# Patient Record
Sex: Male | Born: 1995 | Race: Black or African American | Hispanic: No | Marital: Single | Smoking: Never smoker
Health system: Western US, Academic
[De-identification: ages and names within clinical notes are randomized; demographics above are authoritative.]

## PROBLEM LIST (undated history)

## (undated) ENCOUNTER — Emergency Department (HOSPITAL_COMMUNITY): Payer: Medicaid Other | Source: Home / Self Care

## (undated) VITALS — BP 104/59 | HR 108 | Temp 97.4°F | Resp 18 | Ht 63.0 in | Wt 182.0 lb

## (undated) VITALS — BP 123/84 | HR 91 | Temp 98.3°F | Resp 16

## (undated) DIAGNOSIS — R569 Unspecified convulsions: Secondary | ICD-10-CM

## (undated) DIAGNOSIS — Z5900 Homelessness unspecified: Secondary | ICD-10-CM

## (undated) DIAGNOSIS — J45909 Unspecified asthma, uncomplicated: Secondary | ICD-10-CM

## (undated) DIAGNOSIS — F209 Schizophrenia, unspecified: Secondary | ICD-10-CM

## (undated) DIAGNOSIS — F32A Depression, unspecified: Secondary | ICD-10-CM

## (undated) DIAGNOSIS — T8509XA Other mechanical complication of ventricular intracranial (communicating) shunt, initial encounter: Secondary | ICD-10-CM

## (undated) DIAGNOSIS — R4589 Other symptoms and signs involving emotional state: Secondary | ICD-10-CM

## (undated) DIAGNOSIS — F319 Bipolar disorder, unspecified: Secondary | ICD-10-CM

## (undated) DIAGNOSIS — R45851 Suicidal ideations: Secondary | ICD-10-CM

## (undated) DIAGNOSIS — F329 Major depressive disorder, single episode, unspecified: Secondary | ICD-10-CM

## (undated) DIAGNOSIS — R4689 Other symptoms and signs involving appearance and behavior: Secondary | ICD-10-CM

## (undated) HISTORY — PX: TONSILLECTOMY: SUR1361

## (undated) HISTORY — PX: VENTRICULO-PERITONEAL SHUNT PLACEMENT / LAPAROSCOPIC INSERTION PERITONEAL CATHETER: SUR1040

## (undated) HISTORY — PX: SHUNT REMOVAL: SHX342

## (undated) HISTORY — PX: APPENDECTOMY: SHX54

---
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
## 2013-01-11 ENCOUNTER — Encounter (HOSPITAL_COMMUNITY): Payer: Self-pay | Admitting: *Deleted

## 2013-01-11 ENCOUNTER — Emergency Department (INDEPENDENT_AMBULATORY_CARE_PROVIDER_SITE_OTHER)
Admission: EM | Admit: 2013-01-11 | Discharge: 2013-01-11 | Disposition: A | Discharge status: Home / Self Care | Payer: Self-pay | Source: Home / Self Care | Attending: Emergency Medicine | Admitting: Emergency Medicine

## 2013-01-11 DIAGNOSIS — L255 Unspecified contact dermatitis due to plants, except food: Secondary | ICD-10-CM

## 2013-01-11 DIAGNOSIS — L237 Allergic contact dermatitis due to plants, except food: Secondary | ICD-10-CM

## 2013-01-11 MED ORDER — METHYLPREDNISOLONE ACETATE 80 MG/ML IJ SUSP
INTRAMUSCULAR | Status: AC
  Filled 2013-01-11: qty 1

## 2013-01-11 MED ORDER — DOMEBORO 25 % EX PACK: PACK | CUTANEOUS | Status: DC

## 2013-01-11 MED ORDER — METHYLPREDNISOLONE ACETATE 80 MG/ML IJ SUSP
80.0000 mg | Freq: Once | INTRAMUSCULAR | Status: AC
  Administered 2013-01-11: 80 mg via INTRAMUSCULAR

## 2013-01-11 MED ORDER — TRIAMCINOLONE ACETONIDE 0.1 % EX CREA: TOPICAL_CREAM | Freq: Three times a day (TID) | CUTANEOUS | Status: DC

## 2013-01-11 MED ORDER — PREDNISONE 20 MG PO TABS: ORAL_TABLET | ORAL | Status: DC

## 2013-01-11 NOTE — ED Notes (Addendum)
Vesicular rash to both arms onset yesterday after he got off work.  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  Denies itching or pain.  States his face was a little swollen this AM. Has fine raised bumps on his forehead and cheeks.

## 2013-01-11 NOTE — ED Provider Notes (Signed)
Chief Complaint:   Chief Complaint  Patient presents with  . Rash    History of Present Illness:   Justin Chaney is a 17 year old male who has had a two-day history of a rash on his arms and face. He works in Aeronautical [employer], and yesterday was exposed to brush that might have contained poison ivy. The rash is blistering but not very itchy. He denies any fever, chills, swelling of his lips, tongue, throat, wheezing, or difficulty breathing.  Review of Systems:  Other than noted above, the patient denies any of the following symptoms: Systemic:  No fever, chills, sweats, weight loss, or fatigue. ENT:  No nasal congestion, rhinorrhea, sore throat, swelling of lips, tongue or throat. Resp:  No cough, wheezing, or shortness of breath. Skin:  No rash, itching, nodules, or suspicious lesions.  PMFSH:  Past medical history, family history, social history, meds, and allergies were reviewed.   Physical Exam:   Vital signs:  There were no vitals taken for this visit. Gen:  Alert, oriented, in no distress. ENT:  Pharynx clear, no intraoral lesions, moist mucous membranes. Lungs:  Clear to auscultation. Skin:  He has a severe blistering rash on his forearms with streaks and patches of maculopapules and vesicles. He also has some rash on his face as well. His skin is otherwise clear.  Course in Urgent Care Center:   Given Depo-Medrol 80 mg IM.  Assessment:  The encounter diagnosis was Poison ivy.  Mrs. is very severe, blistering rash. He was warned to watch out for signs of infection and return if he has any further problems.  Plan:   1.  The following meds were prescribed:   Discharge Medication List as of 01/11/2013  5:15 PM    START taking these medications   Details  Alum Sulfate-Ca Acetate (DOMEBORO) 25 % PACK Dissolve 8 pack in 1 gallon on water, use as a moist compress for 15 minutes 3 times daily, Normal    predniSONE (DELTASONE) 20 MG tablet 3 daily for 5 days, 2 daily for 5 days, 1  daily for 5 days., Normal    triamcinolone cream (KENALOG) 0.1 % Apply topically 3 (three) times daily., Starting 01/11/2013, Until Discontinued, Normal       2.  The patient was instructed in symptomatic care and handouts were given. 3.  The patient was told to return if becoming worse in any way, if no better in 3 or 4 days, and given some red flag symptoms such as signs of infection that would indicate earlier return. 4.  Follow up here if needed.     Reuben Likes, MD 01/11/13 2128

## 2013-01-11 NOTE — ED Notes (Signed)
3 " cling with sterile 2x2's applied to both forearms to absorb fluid as blister break open. Pt. Instructed how to care for them.

## 2013-12-19 ENCOUNTER — Emergency Department (HOSPITAL_COMMUNITY)
Admission: EM | Admit: 2013-12-19 | Discharge: 2013-12-19 | Disposition: A | Discharge status: Home / Self Care | Payer: BC Managed Care – PPO | Attending: Emergency Medicine | Admitting: Emergency Medicine

## 2013-12-19 ENCOUNTER — Encounter (HOSPITAL_COMMUNITY): Payer: Self-pay | Admitting: Emergency Medicine

## 2013-12-19 DIAGNOSIS — Z79899 Other long term (current) drug therapy: Secondary | ICD-10-CM | POA: Insufficient documentation

## 2013-12-19 DIAGNOSIS — R45850 Homicidal ideations: Secondary | ICD-10-CM | POA: Insufficient documentation

## 2013-12-19 DIAGNOSIS — IMO0002 Reserved for concepts with insufficient information to code with codable children: Secondary | ICD-10-CM | POA: Insufficient documentation

## 2013-12-19 DIAGNOSIS — R45851 Suicidal ideations: Secondary | ICD-10-CM

## 2013-12-19 DIAGNOSIS — F3289 Other specified depressive episodes: Secondary | ICD-10-CM | POA: Insufficient documentation

## 2013-12-19 DIAGNOSIS — Z880 Allergy status to penicillin: Secondary | ICD-10-CM | POA: Insufficient documentation

## 2013-12-19 DIAGNOSIS — J45909 Unspecified asthma, uncomplicated: Secondary | ICD-10-CM | POA: Insufficient documentation

## 2013-12-19 DIAGNOSIS — F17200 Nicotine dependence, unspecified, uncomplicated: Secondary | ICD-10-CM | POA: Insufficient documentation

## 2013-12-19 DIAGNOSIS — G40909 Epilepsy, unspecified, not intractable, without status epilepticus: Secondary | ICD-10-CM | POA: Insufficient documentation

## 2013-12-19 DIAGNOSIS — Z008 Encounter for other general examination: Secondary | ICD-10-CM

## 2013-12-19 DIAGNOSIS — F1510 Other stimulant abuse, uncomplicated: Secondary | ICD-10-CM | POA: Insufficient documentation

## 2013-12-19 DIAGNOSIS — Z982 Presence of cerebrospinal fluid drainage device: Secondary | ICD-10-CM | POA: Insufficient documentation

## 2013-12-19 DIAGNOSIS — F329 Major depressive disorder, single episode, unspecified: Secondary | ICD-10-CM | POA: Insufficient documentation

## 2013-12-19 DIAGNOSIS — F911 Conduct disorder, childhood-onset type: Secondary | ICD-10-CM | POA: Insufficient documentation

## 2013-12-19 DIAGNOSIS — F1210 Cannabis abuse, uncomplicated: Secondary | ICD-10-CM | POA: Insufficient documentation

## 2013-12-19 LAB — COMPREHENSIVE METABOLIC PANEL
ALT: 28 U/L (ref 0–53)
AST: 32 U/L (ref 0–37)
Albumin: 3.7 g/dL (ref 3.5–5.2)
Alkaline Phosphatase: 84 U/L (ref 39–117)
BUN: 14 mg/dL (ref 6–23)
CO2: 26 mEq/L (ref 19–32)
Calcium: 9 mg/dL (ref 8.4–10.5)
Chloride: 103 mEq/L (ref 96–112)
Creatinine, Ser: 0.75 mg/dL (ref 0.50–1.35)
GFR calc Af Amer: >90 mL/min (ref >90)
GFR calc non Af Amer: >90 mL/min (ref >90)
Glucose, Bld: 105 mg/dL — ABNORMAL HIGH (ref 70–99)
Potassium: 3.8 mEq/L (ref 3.7–5.3)
Sodium: 141 mEq/L (ref 137–147)
Total Bilirubin: 0.3 mg/dL (ref 0.3–1.2)
Total Protein: 6.3 g/dL (ref 6.0–8.3)

## 2013-12-19 LAB — RAPID URINE DRUG SCREEN, HOSP PERFORMED
Amphetamines: POSITIVE — AB
Barbiturates: NOT DETECTED
Benzodiazepines: NOT DETECTED
Cocaine: NOT DETECTED
Opiates: NOT DETECTED
Tetrahydrocannabinol: POSITIVE — AB

## 2013-12-19 LAB — CBC
HCT: 40.2 % (ref 39.0–52.0)
Hemoglobin: 13.9 g/dL (ref 13.0–17.0)
MCH: 29.2 pg (ref 26.0–34.0)
MCHC: 34.6 g/dL (ref 30.0–36.0)
MCV: 84.5 fL (ref 78.0–100.0)
Platelets: 227 10*3/uL (ref 150–400)
RBC: 4.76 MIL/uL (ref 4.22–5.81)
RDW: 11.8 % (ref 11.5–15.5)
WBC: 7 10*3/uL (ref 4.0–10.5)

## 2013-12-19 LAB — SALICYLATE LEVEL: Salicylate Lvl: <2 mg/dL — ABNORMAL LOW (ref 2.8–20.0)

## 2013-12-19 LAB — ETHANOL: Alcohol, Ethyl (B): <11 mg/dL (ref 0–11)

## 2013-12-19 LAB — ACETAMINOPHEN LEVEL: Acetaminophen (Tylenol), Serum: <15 ug/mL (ref 10–30)

## 2013-12-19 MED ORDER — NICOTINE 21 MG/24HR TD PT24: 21.0000 mg | MEDICATED_PATCH | Freq: Every day | TRANSDERMAL | Status: DC

## 2013-12-19 MED ORDER — LORAZEPAM 1 MG PO TABS: 1.0000 mg | ORAL_TABLET | Freq: Three times a day (TID) | ORAL | Status: DC | PRN

## 2013-12-19 MED ORDER — IBUPROFEN 200 MG PO TABS: 600.0000 mg | ORAL_TABLET | Freq: Three times a day (TID) | ORAL | Status: DC | PRN

## 2013-12-19 MED ORDER — ZOLPIDEM TARTRATE 5 MG PO TABS: 5.0000 mg | ORAL_TABLET | Freq: Every evening | ORAL | Status: DC | PRN

## 2013-12-19 NOTE — ED Provider Notes (Signed)
Medical screening examination/treatment/procedure(s) were performed by non-physician practitioner and as supervising physician I was immediately available for consultation/collaboration.   EKG Interpretation None        Leenah Seidner N Spyridon Hornstein, DO 12/19/13 2321 

## 2013-12-19 NOTE — ED Notes (Addendum)
Per paperwork from [place] and pt report, pt is depressed and SI.  Pt reports trying to slit his throat a few days ago.  Pt has no plan right now but states he's always thinking about hurting himself. Pt reports HI in which he will hurt "any body who will get in his way." Pt endorses A/V hallucinations but not currently. 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 Law enforcement at bedside.

## 2013-12-19 NOTE — Discharge Instructions (Signed)
Medical Screening Exam A medical screening exam has been done. This exam helps find the cause of your problem and determines whether you need emergency treatment. Your exam has shown that you do not need emergency treatment at this point. It is safe for you to go to your caregiver's office or clinic for treatment. You should make an appointment today to see your caregiver as soon as he or she is available. Depending on your illness, your symptoms and condition can change over time. If your condition gets worse or you develop new or troubling symptoms before you see your caregiver, you should return to the emergency department for further evaluation.  Document Released: 08/26/2004 Document Revised: 10/11/2011 Document Reviewed: 04/07/2011 [facility] Patient Information 2014 [place], [place]. Return to directly to  [facility] for placement

## 2013-12-19 NOTE — ED Notes (Signed)
Lab results were faxed to [facility].

## 2013-12-19 NOTE — ED Provider Notes (Signed)
CSN: [id number]     Arrival date & time 12/19/13  1950 History   This chart was scribed for non-physician practitioner Junius Creamer, NP, working with Delice Bison Ward, DO, by Neta Ehlers, ED Scribe. This patient was seen in room [facility] and the patient's care was started at 8:46 PM. First MD Initiated Contact with Patient 12/19/13 2041     Chief Complaint  Patient presents with  . Medical Clearance    The history is provided by the patient. No language interpreter was used.   HPI Comments: Justin Chaney is a 18 y.o. male, with a h/o a VP shunt, seizures, and depression, who presents to the Emergency Department for medical clearance. The pt reports he has contemplated suicide for several weeks. He reports he attempted to "cut my throat with a knife from [place]." He denies a h/o psychiatric counseling or medication re his suicide ideations stating he views medication as "poison."  He reports continual anger issues with a desire to "hurt others" though he denies being arrested due to hurting others. The pt was hospitalized at age 33 for anger issues, but he denies subsequent hospitalizations.  Past Medical History  Diagnosis Date  . Asthma   . Seizures   . Brain ventricular shunt obstruction     hydrochelpis  . Depression    Past Surgical History  Procedure Laterality Date  . Tonsillectomy    . Ventriculo-peritoneal shunt placement / laparoscopic insertion peritoneal catheter     No family history on file. History  Substance Use Topics  . Smoking status: Current Every Day Smoker -- 2.00 packs/day    Types: Cigarettes  . Smokeless tobacco: Not on file  . Alcohol Use: 2.4 oz/week    4 Cans of beer per week     Comment: 4-8 40 oz.on the weekend    Review of Systems  Constitutional: Negative for fever.  Psychiatric/Behavioral: Positive for suicidal ideas, behavioral problems and self-injury.    Allergies  Amoxicillin and Penicillins  Home Medications   Prior to  Admission medications   Medication Sig Start Date End Date Taking? Authorizing Provider  Alum Sulfate-Ca Acetate (DOMEBORO) 25 % PACK Dissolve 8 pack in 1 gallon on water, use as a moist compress for 15 minutes 3 times daily 01/11/13   Harden Mo, MD  escitalopram (LEXAPRO) 10 MG tablet Take 1 tablet (10 mg total) by mouth daily. 08/15/12   Durward Parcel, MD  escitalopram (LEXAPRO) 10 MG tablet Take 1 tablet (10 mg total) by mouth daily. 08/15/12   Orpah Greek, MD  predniSONE (DELTASONE) 20 MG tablet 3 daily for 5 days, 2 daily for 5 days, 1 daily for 5 days. 01/11/13   Harden Mo, MD  triamcinolone cream (KENALOG) 0.1 % Apply topically 3 (three) times daily. 01/11/13   Harden Mo, MD   Triage Vitals: BP 123/71  Pulse 92  Temp(Src) 98.2 F (36.8 C) (Oral)  Resp 18  SpO2 99%  Physical Exam  Nursing note and vitals reviewed. Constitutional: He is oriented to person, place, and time. He appears well-developed and well-nourished. No distress.  HENT:  Head: Normocephalic and atraumatic.  Eyes: EOM are normal.  Neck: Neck supple. No tracheal deviation present.  Cardiovascular: Normal rate.   Pulmonary/Chest: Effort normal. No respiratory distress.  Musculoskeletal: Normal range of motion.  Neurological: He is alert and oriented to person, place, and time.  Skin: Skin is warm and dry.  Psychiatric: His speech is normal and behavior is  normal. His affect is angry. Cognition and memory are normal. He expresses impulsivity and inappropriate judgment. He expresses homicidal and suicidal ideation. He expresses suicidal plans.    ED Course  Procedures (including critical care time)  DIAGNOSTIC STUDIES: Oxygen Saturation is 99% on room air, normal by my interpretation.    COORDINATION OF CARE:  9:00 PM- Discussed treatment plan with patient, and the patient agreed to the plan.   Labs Review Labs Reviewed  COMPREHENSIVE METABOLIC PANEL - Abnormal; Notable for  the following:    Glucose, Bld 105 (*)    All other components within normal limits  SALICYLATE LEVEL - Abnormal; Notable for the following:    Salicylate Lvl <9.6 (*)    All other components within normal limits  URINE RAPID DRUG SCREEN (HOSP PERFORMED) - Abnormal; Notable for the following:    Amphetamines POSITIVE (*)    Tetrahydrocannabinol POSITIVE (*)    All other components within normal limits  ACETAMINOPHEN LEVEL  CBC  ETHANOL    Imaging Review No results found.   EKG Interpretation None      MDM  Glue was uncooperative and resistant to giving a history after he was given his options.  He is now able to cooperate agrees to during assessment.  He understands that if he becomes agitated out-of-control will be given medication. Patient has been medically cleared to return to [facility]  for placement Final diagnoses:  Suicidal ideation  Medical clearance for psychiatric admission       I personally performed the services described in this documentation, which was scribed in my presence. The recorded information has been reviewed and is accurate.     Garald Balding, NP 12/19/13 2201

## 2013-12-19 NOTE — ED Notes (Signed)
Pt endorses THC use and adderol.

## 2013-12-19 NOTE — ED Notes (Signed)
Per [place] at [facility], ok to transport back to facility.

## 2013-12-30 ENCOUNTER — Emergency Department (HOSPITAL_COMMUNITY)
Admission: EM | Admit: 2013-12-30 | Discharge: 2014-01-01 | Disposition: A | Discharge status: Home / Self Care | Payer: BC Managed Care – PPO | Attending: Emergency Medicine | Admitting: Emergency Medicine

## 2013-12-30 ENCOUNTER — Encounter (HOSPITAL_COMMUNITY): Payer: Self-pay | Admitting: Emergency Medicine

## 2013-12-30 DIAGNOSIS — F17200 Nicotine dependence, unspecified, uncomplicated: Secondary | ICD-10-CM | POA: Insufficient documentation

## 2013-12-30 DIAGNOSIS — F329 Major depressive disorder, single episode, unspecified: Secondary | ICD-10-CM | POA: Insufficient documentation

## 2013-12-30 DIAGNOSIS — R443 Hallucinations, unspecified: Secondary | ICD-10-CM | POA: Insufficient documentation

## 2013-12-30 DIAGNOSIS — R45850 Homicidal ideations: Secondary | ICD-10-CM

## 2013-12-30 DIAGNOSIS — F1994 Other psychoactive substance use, unspecified with psychoactive substance-induced mood disorder: Secondary | ICD-10-CM

## 2013-12-30 DIAGNOSIS — F0630 Mood disorder due to known physiological condition, unspecified: Secondary | ICD-10-CM

## 2013-12-30 DIAGNOSIS — Z982 Presence of cerebrospinal fluid drainage device: Secondary | ICD-10-CM | POA: Insufficient documentation

## 2013-12-30 DIAGNOSIS — F1210 Cannabis abuse, uncomplicated: Secondary | ICD-10-CM | POA: Diagnosis present

## 2013-12-30 DIAGNOSIS — F3289 Other specified depressive episodes: Secondary | ICD-10-CM | POA: Insufficient documentation

## 2013-12-30 DIAGNOSIS — R45851 Suicidal ideations: Secondary | ICD-10-CM

## 2013-12-30 DIAGNOSIS — F29 Unspecified psychosis not due to a substance or known physiological condition: Secondary | ICD-10-CM | POA: Diagnosis present

## 2013-12-30 DIAGNOSIS — R440 Auditory hallucinations: Secondary | ICD-10-CM

## 2013-12-30 DIAGNOSIS — F1510 Other stimulant abuse, uncomplicated: Secondary | ICD-10-CM | POA: Diagnosis present

## 2013-12-30 NOTE — ED Notes (Signed)
Pt refusing to have blood drawn.

## 2013-12-30 NOTE — ED Notes (Signed)
The pt reports that he just left [facility].  He also was at [facility] long ed recently but denies that

## 2013-12-30 NOTE — ED Notes (Signed)
The mother brought the pt in because has been agitated  Today hearing voices.  He has not been living at home.  He has been to [facility] but he will not take his meds.  Scattered ideas .  He thinks med poisons people

## 2013-12-30 NOTE — ED Notes (Signed)
Phlebotomy notified pt aggred to have blood work drawn.

## 2013-12-30 NOTE — ED Notes (Signed)
The mother states the pt is suicidal however the pt is not verbalizing this

## 2013-12-30 NOTE — ED Notes (Signed)
The pt thinks about suicide but he does not feel like that now.  But he thinks about killing other people

## 2013-12-31 ENCOUNTER — Encounter (HOSPITAL_COMMUNITY): Payer: Self-pay | Admitting: Emergency Medicine

## 2013-12-31 DIAGNOSIS — F29 Unspecified psychosis not due to a substance or known physiological condition: Secondary | ICD-10-CM | POA: Diagnosis present

## 2013-12-31 DIAGNOSIS — F1994 Other psychoactive substance use, unspecified with psychoactive substance-induced mood disorder: Secondary | ICD-10-CM

## 2013-12-31 DIAGNOSIS — F1910 Other psychoactive substance abuse, uncomplicated: Secondary | ICD-10-CM

## 2013-12-31 DIAGNOSIS — R45850 Homicidal ideations: Secondary | ICD-10-CM

## 2013-12-31 DIAGNOSIS — R45851 Suicidal ideations: Secondary | ICD-10-CM

## 2013-12-31 LAB — CBC
HCT: 39.5 % (ref 39.0–52.0)
Hemoglobin: 13.4 g/dL (ref 13.0–17.0)
MCH: 28.9 pg (ref 26.0–34.0)
MCHC: 33.9 g/dL (ref 30.0–36.0)
MCV: 85.1 fL (ref 78.0–100.0)
Platelets: 209 10*3/uL (ref 150–400)
RBC: 4.64 MIL/uL (ref 4.22–5.81)
RDW: 12.2 % (ref 11.5–15.5)
WBC: 7.1 10*3/uL (ref 4.0–10.5)

## 2013-12-31 LAB — COMPREHENSIVE METABOLIC PANEL
ALT: 43 U/L (ref 0–53)
AST: 65 U/L — ABNORMAL HIGH (ref 0–37)
Albumin: 3.9 g/dL (ref 3.5–5.2)
Alkaline Phosphatase: 80 U/L (ref 39–117)
BUN: 12 mg/dL (ref 6–23)
CO2: 21 mEq/L (ref 19–32)
Calcium: 9.3 mg/dL (ref 8.4–10.5)
Chloride: 108 mEq/L (ref 96–112)
Creatinine, Ser: 0.72 mg/dL (ref 0.50–1.35)
GFR calc Af Amer: >90 mL/min (ref >90)
GFR calc non Af Amer: >90 mL/min (ref >90)
Glucose, Bld: 103 mg/dL — ABNORMAL HIGH (ref 70–99)
Potassium: 3.8 mEq/L (ref 3.7–5.3)
Sodium: 142 mEq/L (ref 137–147)
Total Bilirubin: 0.6 mg/dL (ref 0.3–1.2)
Total Protein: 6.9 g/dL (ref 6.0–8.3)

## 2013-12-31 LAB — RAPID URINE DRUG SCREEN, HOSP PERFORMED
Amphetamines: POSITIVE — AB
Barbiturates: NOT DETECTED
Benzodiazepines: NOT DETECTED
Cocaine: NOT DETECTED
Opiates: NOT DETECTED
Tetrahydrocannabinol: POSITIVE — AB

## 2013-12-31 LAB — ETHANOL: Alcohol, Ethyl (B): <11 mg/dL (ref 0–11)

## 2013-12-31 LAB — SALICYLATE LEVEL: Salicylate Lvl: <2 mg/dL — ABNORMAL LOW (ref 2.8–20.0)

## 2013-12-31 LAB — ACETAMINOPHEN LEVEL: Acetaminophen (Tylenol), Serum: <15 ug/mL (ref 10–30)

## 2013-12-31 MED ORDER — ONDANSETRON HCL 4 MG PO TABS: 4.0000 mg | ORAL_TABLET | Freq: Three times a day (TID) | ORAL | Status: DC | PRN

## 2013-12-31 MED ORDER — LORAZEPAM 1 MG PO TABS
1.0000 mg | ORAL_TABLET | Freq: Once | ORAL | Status: DC
  Filled 2013-12-31: qty 1

## 2013-12-31 MED ORDER — ACETAMINOPHEN 325 MG PO TABS: 650.0000 mg | ORAL_TABLET | ORAL | Status: DC | PRN

## 2013-12-31 MED ORDER — ZIPRASIDONE MESYLATE 20 MG IM SOLR
20.0000 mg | Freq: Once | INTRAMUSCULAR | Status: AC | PRN
  Administered 2013-12-31: 20 mg via INTRAMUSCULAR
  Filled 2013-12-31: qty 20

## 2013-12-31 MED ORDER — ZOLPIDEM TARTRATE 5 MG PO TABS: 5.0000 mg | ORAL_TABLET | Freq: Every evening | ORAL | Status: DC | PRN

## 2013-12-31 MED ORDER — IBUPROFEN 200 MG PO TABS: 600.0000 mg | ORAL_TABLET | Freq: Three times a day (TID) | ORAL | Status: DC | PRN

## 2013-12-31 MED ORDER — NICOTINE 21 MG/24HR TD PT24
21.0000 mg | MEDICATED_PATCH | Freq: Every day | TRANSDERMAL | Status: DC
Start: 2013-12-31 — End: 2014-01-01

## 2013-12-31 NOTE — ED Provider Notes (Signed)
CSN: [id number]     Arrival date & time 12/30/13  2317 History   First MD Initiated Contact with Patient 12/30/13 2353     Chief Complaint  Patient presents with  . Psychiatric Evaluation     (Consider location/radiation/quality/duration/timing/severity/associated sxs/prior Treatment) HPI 18 year old male presents to emergency room brought in by his mother who is concerned about his mental state.  His mother reports that he has been increasingly more agitated.  He has been hearing voices that bother him.  Patient was walking outside in the rain pacing in the driveway today do to intrusive thoughts and voices.  Patient has history of bipolar, has been too but there before, but will not take his medications or go to therapy.  Patient has reported that he feels that the medications are poison.  Patient has made statements about killing himself or killing other people.  He denies having a specific plan or people that he wants to harm.  He reports some day it will just happen.  Patient is not willing to speak about anything with me.  His mother is concerned for her safety and the safety of the other children in the home.  She reports that her son, the patient doesn't officially with him, often will  live in the woods for days on end. Past Medical History  Diagnosis Date  . Asthma   . Seizures   . Brain ventricular shunt obstruction     hydrochelpis  . Depression    Past Surgical History  Procedure Laterality Date  . Tonsillectomy    . Ventriculo-peritoneal shunt placement / laparoscopic insertion peritoneal catheter     No family history on file. History  Substance Use Topics  . Smoking status: Current Every Day Smoker -- 2.00 packs/day    Types: Cigarettes  . Smokeless tobacco: Not on file  . Alcohol Use: 2.4 oz/week    4 Cans of beer per week     Comment: 4-8 40 oz.on the weekend    Review of Systems  Unable to perform ROS: Psychiatric disorder      Allergies  Amoxicillin and  Penicillins  Home Medications   Prior to Admission medications   Not on File   BP 84/69  Pulse 81  Temp(Src) 98 F (36.7 C) (Oral)  Resp 18  SpO2 98% Physical Exam  Nursing note and vitals reviewed. Constitutional: He appears well-developed and well-nourished. He appears distressed.  Skin: Skin is warm and dry. No rash noted. No erythema. No pallor.  Psychiatric:  Patient has hostile mood.  He reports he has thoughts of hurting others and himself.  He refuses all interactions.   patient refuses physical exam  ED Course  Procedures (including critical care time) Labs Review Labs Reviewed  COMPREHENSIVE METABOLIC PANEL - Abnormal; Notable for the following:    Glucose, Bld 103 (*)    AST 65 (*)    All other components within normal limits  SALICYLATE LEVEL - Abnormal; Notable for the following:    Salicylate Lvl <7.0 (*)    All other components within normal limits  URINE RAPID DRUG SCREEN (HOSP PERFORMED) - Abnormal; Notable for the following:    Amphetamines POSITIVE (*)    Tetrahydrocannabinol POSITIVE (*)    All other components within normal limits  ACETAMINOPHEN LEVEL  CBC  ETHANOL    Imaging Review No results found.   EKG Interpretation None      MDM   Final diagnoses:  Psychosis  Auditory hallucinations  Pt has been IVC'd due to concern for elopement.  To be transferred to locked unit at [facility] ED.    Kalman Drape, MD 12/31/13 [phone]

## 2013-12-31 NOTE — ED Notes (Signed)
[facility] called to transport

## 2013-12-31 NOTE — ED Notes (Signed)
Discuss with Dr. Sharol Given of the possiblity for patient to be transfer to [facility] psych ed, patient is very guarded refused to communicate with present er staff. Pt will keep his street clothes on,appears ready to dart out of the room if motvated, Patient appears in a psychotic state at present. Refused to take any medication, stating all medication are posison

## 2013-12-31 NOTE — BH Assessment (Signed)
Received a call for a tele-assessment. Patient has been refusing to talk to others. Pt is experiencing auditory hallucinations and has been pacing around with a blanket in the rain. Pt stated "everyone thinks about suicide everyday". Pt reported that he wants to kill people and stated that he will do it one days. Pt has been hospitalized in the past. Pt is very angry,hostile and refusing to change into scrubs. Pt will be transferred to [facility] later on this morning. Tele-assessment will be initiated.

## 2013-12-31 NOTE — BH Assessment (Signed)
Assessment Note  Justin Chaney is an 18 y.o. male who presents to [facility] ED after his mother encouraged him to come. It has been reported that patient is experiencing auditory hallucinations and has been walking outside with a blanket over his head in the rain.  Pt is alert; however he is very uncooperative during this assessment. When pt was asked about thoughts to harm himself pt stated "I don't know what you speak of". When patient was asked about HI pt laughed and then stated "I don't know what you speak of. Pt is currently endorsing AH/VH at this time and stated "I don't feel like talking about it right now". Pt reported that he uses illicit drugs and most recently smoked a "roach clip" and took about "10 Adderall's". Pt reported that he was hospitalized at [facility] when he was 18 years old for anger issues. When pt was asked about his medication, pt stated I don't have medicine it's poising. Pt denied any physical or sexual abuse at this time. When asked about emotional abuse pt responded "it depends on how you look at it". Pt reported that he lives alone and was recently fired from his job. It has been reported that patient mother is scared that he will harm other children in the home or herself.   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 Asthma   . Seizures   . Brain ventricular shunt obstruction     hydrochelpis  . Depression    Axis IV: other psychosocial or environmental problems Axis V: 21-30 behavior considerably influenced by delusions or hallucinations OR serious impairment in judgment, communication OR inability to function in almost all areas  Past Medical History:  Past Medical History  Diagnosis Date  . Asthma   . Seizures   . Brain ventricular shunt obstruction     hydrochelpis  . Depression     Past Surgical History  Procedure Laterality Date  . Tonsillectomy    .  Ventriculo-peritoneal shunt placement / laparoscopic insertion peritoneal catheter      Family History: History reviewed. No pertinent family history.  Social History:  reports that he has been smoking Cigarettes.  He has been smoking about 2.00 packs per day. He does not have any smokeless tobacco history on file. He reports that he drinks about 2.4 ounces of alcohol per week. He reports that he uses illicit drugs (Marijuana) about 7 times per week.  Additional Social History:  Alcohol / Drug Use Prescriptions: Pt abuses Adderall History of alcohol / drug use?: Yes Substance #1 Name of Substance 1: Marijuana  1 - Amount (size/oz): "roach clip" 1 - Last Use / Amount: 12-30-13  CIWA: CIWA-Ar BP: 84/69 mmHg Pulse Rate: 81 COWS:    Allergies:  Allergies  Allergen Reactions  . Amoxicillin Other (See Comments)    Reaction unknown  . Penicillins Other (See Comments)    Reaction unknown    Home Medications:  (Not in a hospital admission)  OB/GYN Status:  No LMP for male patient.  General Assessment Data Location of Assessment: [facility] ED Is this a Tele or Face-to-Face Assessment?: Tele Assessment Is this an Initial Assessment or a Re-assessment for this encounter?: Initial Assessment Living Arrangements: Alone Can pt return to current living arrangement?: Yes Admission Status: Involuntary Is patient capable of signing voluntary admission?: No Transfer from: [facility] Referral Source: Self/Family/Friend     [facility]  Crisis Care Plan Living Arrangements: Alone Name of Psychiatrist: None reported  Name of Therapist: none reported  Education Status Is patient currently in school?: No  Risk to self Suicidal Ideation: No Suicidal Intent: No Is patient at risk for suicide?: No Suicidal Plan?: No Access to Means: No What has been your use of drugs/alcohol within the last 12 months?: daily Previous Attempts/Gestures: No Triggers for Past Attempts: None known Intentional Self  Injurious Behavior: None Family Suicide History: Unable to assess Recent stressful life event(s): Job Loss Persecutory voices/beliefs?: No Depression: Yes Substance abuse history and/or treatment for substance abuse?: Yes Suicide prevention information given to non-admitted patients: Not applicable  Risk to Others Homicidal Ideation: Yes-Currently Present Thoughts of Harm to Others: No Current Homicidal Intent: No Current Homicidal Plan: No Access to Homicidal Means: No Identified Victim: NA History of harm to others?:  (unknown) Assessment of Violence: None Noted Violent Behavior Description: no violent behavior reported Does patient have access to weapons?:  (unknown ) Criminal Charges Pending?:  (unknown ) Does patient have a court date:  (unknown)  Psychosis Hallucinations: Auditory;Visual Delusions: None noted  Mental Status Report Appear/Hygiene: Disheveled Eye Contact: Poor Motor Activity: Freedom of movement Speech: Logical/coherent Level of Consciousness: Quiet/awake Mood: Depressed Affect: Blunted Anxiety Level: None Thought Processes: Circumstantial Judgement: Impaired Orientation: Appropriate for developmental age Obsessive Compulsive Thoughts/Behaviors: Unable to Assess  Cognitive Functioning Concentration: Unable to Assess Memory: Unable to Assess IQ: Average Insight: Fair Impulse Control: Fair Appetite: Fair Weight Loss: 0 Sleep: No Change Total Hours of Sleep: 6 Vegetative Symptoms: None  ADLScreening [facility] Assessment Services) Patient's cognitive ability adequate to safely complete daily activities?: Yes Patient able to express need for assistance with ADLs?: Yes Independently performs ADLs?: Yes (appropriate for developmental age)  Prior Inpatient Therapy Prior Inpatient Therapy: Yes Prior Therapy Dates: 2010 Prior Therapy Facilty/Provider(s): [facility] Reason for Treatment: "anger issues"  Prior Outpatient Therapy Prior Outpatient Therapy:  No  ADL Screening (condition at time of admission) Patient's cognitive ability adequate to safely complete daily activities?: Yes Is the patient deaf or have difficulty hearing?: No Does the patient have difficulty seeing, even when wearing glasses/contacts?: No Does the patient have difficulty concentrating, remembering, or making decisions?: No Patient able to express need for assistance with ADLs?: Yes Does the patient have difficulty dressing or bathing?: No Independently performs ADLs?: Yes (appropriate for developmental age) Does the patient have difficulty walking or climbing stairs?: No       Abuse/Neglect Assessment (Assessment to be complete while patient is alone) Physical Abuse: Denies Verbal Abuse: Denies Sexual Abuse: Denies Exploitation of patient/patient's resources: Denies Self-Neglect: Denies Values / Beliefs Cultural Requests During Hospitalization: None        Additional Information 1:1 In Past 12 Months?: No CIRT Risk: No Elopement Risk: No Does patient have medical clearance?: Yes     Disposition:  Disposition Initial Assessment Completed for this Encounter: Yes Disposition of Patient: Inpatient treatment program Type of inpatient treatment program: Adult  On Site Evaluation by:   Reviewed with Physician:    Kandis Ban 12/31/2013 5:18 AM

## 2013-12-31 NOTE — ED Notes (Signed)
Bed: [facility] Expected date: 12/31/13 Expected time:  Means of arrival: Police Comments: [facility] transfer

## 2013-12-31 NOTE — Progress Notes (Signed)
Attempted placement with the following facilities:  [facility]- no beds [facility]- faxed information [facility]- left message [facility]- [facility] beds [facility]- [facility]- no beds [facility]- no beds [facility]- faxed information [facility]- can fax the regional referral [facility]- faxed information [facility]- intakes cut off at [place]- faxed information [facility]- no beds [facility]- faxed information    Chesley Noon, MSW, [place], 12/31/2013 Evening Clinical Social Worker [phone]

## 2013-12-31 NOTE — ED Notes (Signed)
Patient arrived from [facility] ED in the custody of [employer].  He was given and changed into paper scrubs.  Belongings searched and placed in locker number 43.  Patient requested and was given some papers that were in his bag.  He is irritable, but cooperative at this time.

## 2013-12-31 NOTE — ED Notes (Signed)
Patient said not to vitals

## 2013-12-31 NOTE — Progress Notes (Signed)
MHT initiated placement at inpatient facilities with bed availability as follows:  1)[facility]-faxed referral 2)[facility]-faxed referral 3)[facility]-no beds [facility]-no beds 5)[facility]-faxed referral 6)[facility]-faxed referral 7)[facility]-no beds 8)[facility]-faxed referral 9)[facility]-faxed referral 10)[facility]-no beds  [facility], MHT/NS

## 2013-12-31 NOTE — ED Notes (Signed)
Patient started to become anxious, agitated and demanding.  He wanted to leave and did not seem to understand my explanation of his IVC status.  I offered him lorazepam for anxiety and he stated he was not going to take any medication from me because it was all poison.  Discussed the case with Waylan Boga, NP and she put in an order for Geodon 20 mg IM.  When I brought the medication into the room the patient stated he was not going to allow me to give it.  Security staff and [employer] were present and assisted the patient in rolling onto his side.  He did not resist or fight in any way.  The injection was given without incident.  The patient then ate some dinner and is currently lying quietly on his bed.

## 2013-12-31 NOTE — Consult Note (Signed)
Patient seen, irritable, appears to be responding to internal stimuli and needs inpatient psychiatric care

## 2013-12-31 NOTE — ED Notes (Signed)
D:  Patient had been irritable on admit.  Later this afternoon he stated he did not feel well when he arrived and may have said some things he did not mean.  He offered apologies for any negative behaviors and stated it was important to him that all staff knew that he was sorry.   A:  Apologies accepted and relayed to all staff.  Offered support and encouragement to patient.   R:  Pleasant at this time.  Cooperative with staff.

## 2013-12-31 NOTE — Consult Note (Signed)
[facility] Face-to-Face Psychiatry Consult   Reason for Consult:  Psychosis, amphetamine/marijuana abuse, suicidal and homicidal ideations Referring Physician:  EDP  Just[facility] is an 18 y.o. male. Total Time spent with patient: 15 minutes  Assessment: AXIS I:  Substance Induced Mood Disorder; psychosis AXIS II:  Deferred AXIS III:   Past Medical History  Diagnosis Date  . Asthma   . Seizures   . Brain ventricular shunt obstruction     hydrochelpis  . Depression    AXIS IV:  other psychosocial or environmental problems, problems related to social environment and problems with primary support group AXIS V:  21-30 behavior considerably influenced by delusions or hallucinations OR serious impairment in judgment, communication OR inability to function in almost all areas  Plan:  Recommend psychiatric Inpatient admission when medically cleared. Dr. Dwyane Dee assessed the patient and concurs with the plan.  Subjective:   [facility] is a 18 y.o. male patient admitted with drug-induced psychosis with suicidal & homicidal ideations.  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  His mother brought him to the ED because he was hearing voices and stating he wanted to kill people and himself. Per TTS report:  18 y.o. male who presents to [facility] ED after his mother encouraged him to come. It has been reported that patient is experiencing auditory hallucinations and has been walking outside with a blanket over his head in the rain. Pt is alert; however he is very uncooperative during this assessment. When pt was asked about thoughts to harm himself pt stated "I don't know what you speak of". When patient was asked about HI pt laughed and then stated "I don't know what you speak of. Pt is currently endorsing AH/VH at this time and stated "I don't feel like talking about it right now". Pt reported that he uses illicit drugs and most recently  smoked a "roach clip" and took about "10 Adderall's". Pt reported that he was hospitalized at [facility] when he was 18 years old for anger issues. When pt was asked about his medication, pt stated I don't have medicine it's poision. Pt denied any physical or sexual abuse at this time. When asked about emotional abuse pt responded "it depends on how you look at it". Pt reported that he lives alone and was recently fired from his job. It has been reported that patient mother is scared that he will harm other children in the home or herself.   HPI Elements:   Location:  generalized. Quality:  acute. Severity:  severe. Timing:  constant. Duration:  few days. Context:  stressors, drug use.  Past Psychiatric History: Past Medical History  Diagnosis Date  . Asthma   . Seizures   . Brain ventricular shunt obstruction     hydrochelpis  . Depression     reports that he has been smoking Cigarettes.  He has been smoking about 2.00 packs per day. He does not have any smokeless tobacco history on file. He reports that he drinks about 2.4 ounces of alcohol per week. He reports that he uses illicit drugs (Marijuana) about 7 times per week. History reviewed. No pertinent family history. Family History Substance Abuse: No Family Supports: No Living Arrangements: Alone Can pt return to current living arrangement?: Yes Abuse/Neglect [facility]) Physical Abuse: Denies Verbal Abuse: Denies Sexual Abuse: Denies Allergies:   Allergies  Allergen Reactions  . Amoxicillin Other (See Comments)    Reaction  unknown  . Penicillins Other (See Comments)    Reaction unknown    ACT Assessment Complete:  Yes:    Educational Status    Risk to Self: Risk to self Suicidal Ideation: No Suicidal Intent: No Is patient at risk for suicide?: No Suicidal Plan?: No Access to Means: No What has been your use of drugs/alcohol within the last 12 months?: daily Previous Attempts/Gestures: No Triggers for Past Attempts: None  known Intentional Self Injurious Behavior: None Family Suicide History: Unable to assess Recent stressful life event(s): Job Loss Persecutory voices/beliefs?: No Depression: Yes Substance abuse history and/or treatment for substance abuse?: Yes Suicide prevention information given to non-admitted patients: Not applicable  Risk to Others: Risk to Others Homicidal Ideation: Yes-Currently Present Thoughts of Harm to Others: No Current Homicidal Intent: No Current Homicidal Plan: No Access to Homicidal Means: No Identified Victim: NA History of harm to others?:  (unknown) Assessment of Violence: None Noted Violent Behavior Description: no violent behavior reported Does patient have access to weapons?:  (unknown ) Criminal Charges Pending?:  (unknown ) Does patient have a court date:  (unknown)  Abuse: Abuse/Neglect Assessment (Assessment to be complete while patient is alone) Physical Abuse: Denies Verbal Abuse: Denies Sexual Abuse: Denies Exploitation of patient/patient's resources: Denies Self-Neglect: Denies  Prior Inpatient Therapy: Prior Inpatient Therapy Prior Inpatient Therapy: Yes Prior Therapy Dates: 2010 Prior Therapy Facilty/Provider(s): [facility] Reason for Treatment: "anger issues"  Prior Outpatient Therapy: Prior Outpatient Therapy Prior Outpatient Therapy: No  Additional Information: Additional Information 1:1 In Past 12 Months?: No CIRT Risk: No Elopement Risk: No Does patient have medical clearance?: Yes                  Objective: Blood pressure 108/71, pulse 93, temperature 97.9 F (36.6 C), temperature source Oral, resp. rate 16, SpO2 98.00%.There is no height or weight on file to calculate BMI. Results for orders placed during the hospital encounter of 12/30/13 (from the past 72 hour(s))  ACETAMINOPHEN LEVEL     Status: None   Collection Time    12/31/13 12:05 AM      Result Value Ref Range   Acetaminophen (Tylenol), Serum <15.0  10 - 30 ug/mL    Comment:            THERAPEUTIC CONCENTRATIONS VARY     SIGNIFICANTLY. A RANGE OF 10-30     ug/mL MAY BE AN EFFECTIVE     CONCENTRATION FOR MANY PATIENTS.     HOWEVER, SOME ARE BEST TREATED     AT CONCENTRATIONS OUTSIDE THIS     RANGE.     ACETAMINOPHEN CONCENTRATIONS     >150 ug/mL AT 4 HOURS AFTER     INGESTION AND >50 ug/mL AT 12     HOURS AFTER INGESTION ARE     OFTEN ASSOCIATED WITH TOXIC     REACTIONS.  CBC     Status: None   Collection Time    12/31/13 12:05 AM      Result Value Ref Range   WBC 7.1  4.0 - 10.5 K/uL   RBC 4.64  4.22 - 5.81 MIL/uL   Hemoglobin 13.4  13.0 - 17.0 g/dL   HCT 39.5  39.0 - 52.0 %   MCV 85.1  78.0 - 100.0 fL   MCH 28.9  26.0 - 34.0 pg   MCHC 33.9  30.0 - 36.0 g/dL   RDW 12.2  11.5 - 15.5 %   Platelets 209  150 - 400 K/uL  COMPREHENSIVE METABOLIC PANEL     Status: Abnormal   Collection Time    12/31/13 12:05 AM      Result Value Ref Range   Sodium 142  137 - 147 mEq/L   Potassium 3.8  3.7 - 5.3 mEq/L   Chloride 108  96 - 112 mEq/L   CO2 21  19 - 32 mEq/L   Glucose, Bld 103 (*) 70 - 99 mg/dL   BUN 12  6 - 23 mg/dL   Creatinine, Ser 0.72  0.50 - 1.35 mg/dL   Calcium 9.3  8.4 - 10.5 mg/dL   Total Protein 6.9  6.0 - 8.3 g/dL   Albumin 3.9  3.5 - 5.2 g/dL   AST 65 (*) 0 - 37 U/L   ALT 43  0 - 53 U/L   Alkaline Phosphatase 80  39 - 117 U/L   Total Bilirubin 0.6  0.3 - 1.2 mg/dL   GFR calc non Af Amer >90  >90 mL/min   GFR calc Af Amer >90  >90 mL/min   Comment: (NOTE)     The eGFR has been calculated using the CKD EPI equation.     This calculation has not been validated in all clinical situations.     eGFR's persistently <90 mL/min signify possible Chronic Kidney     Disease.  ETHANOL     Status: None   Collection Time    12/31/13 12:05 AM      Result Value Ref Range   Alcohol, Ethyl (B) <11  0 - 11 mg/dL   Comment:            LOWEST DETECTABLE LIMIT FOR     SERUM ALCOHOL IS 11 mg/dL     FOR MEDICAL PURPOSES ONLY  SALICYLATE  LEVEL     Status: Abnormal   Collection Time    12/31/13 12:05 AM      Result Value Ref Range   Salicylate Lvl <8.0 (*) 2.8 - 20.0 mg/dL  URINE RAPID DRUG SCREEN (HOSP PERFORMED)     Status: Abnormal   Collection Time    12/31/13 12:15 AM      Result Value Ref Range   Opiates NONE DETECTED  NONE DETECTED   Cocaine NONE DETECTED  NONE DETECTED   Benzodiazepines NONE DETECTED  NONE DETECTED   Amphetamines POSITIVE (*) NONE DETECTED   Tetrahydrocannabinol POSITIVE (*) NONE DETECTED   Barbiturates NONE DETECTED  NONE DETECTED   Comment:            DRUG SCREEN FOR MEDICAL PURPOSES     ONLY.  IF CONFIRMATION IS NEEDED     FOR ANY PURPOSE, NOTIFY LAB     WITHIN 5 DAYS.                LOWEST DETECTABLE LIMITS     FOR URINE DRUG SCREEN     Drug Class       Cutoff (ng/mL)     Amphetamine      1000     Barbiturate      200     Benzodiazepine   321     Tricyclics       224     Opiates          300     Cocaine          300     THC              50   Labs are reviewed  and are pertinent for no medical issues noted.  Current Facility-Administered Medications  Medication Dose Route Frequency Provider Last Rate Last Dose  . acetaminophen (TYLENOL) tablet 650 mg  650 mg Oral Q4H PRN Kalman Drape, MD      . ibuprofen (ADVIL,MOTRIN) tablet 600 mg  600 mg Oral Q8H PRN Kalman Drape, MD      . LORazepam (ATIVAN) tablet 1 mg  1 mg Oral Once Kalman Drape, MD      . nicotine (NICODERM CQ - dosed in mg/24 hours) patch 21 mg  21 mg Transdermal Daily Kalman Drape, MD      . ondansetron [facility]) tablet 4 mg  4 mg Oral Q8H PRN Kalman Drape, MD      . zolpidem [facility]) tablet 5 mg  5 mg Oral QHS PRN Kalman Drape, MD       No current outpatient prescriptions on file.    Psychiatric Specialty Exam:     Blood pressure 108/71, pulse 93, temperature 97.9 F (36.6 C), temperature source Oral, resp. rate 16, SpO2 98.00%.There is no height or weight on file to calculate BMI.  General Appearance: Disheveled   Eye Contact::  Poor  Speech:  Normal Rate  Volume:  Normal  Mood:  Irritable  Affect:  Congruent  Thought Process:  Disorganized  Orientation:  Full (Time, Place, and Person)  Thought Content:  Hallucinations: Auditory Visual  Suicidal Thoughts:  Yes.  without intent/plan  Homicidal Thoughts:  Yes.  without intent/plan  Memory:  Immediate;   Poor Recent;   Poor Remote;   Poor  Judgement:  Impaired  Insight:  Lacking  Psychomotor Activity:  Decreased  Concentration:  Poor  Recall:  Poor  Fund of Knowledge:Fair  Language: Fair  Akathisia:  No  Handed:  Right  AIMS (if indicated):     Assets:  Housing Leisure Time Physical Health Resilience Social Support  Sleep:      Musculoskeletal: Strength & Muscle Tone: within normal limits Gait & Station: normal Patient leans: N/A  Treatment Plan Summary: Daily contact with patient to assess and evaluate symptoms and progress in treatment Medication management; inpatient hospitalization for mood stabilization.  Waylan Boga, PMH-NP 12/31/2013 1:41 PM

## 2014-01-01 ENCOUNTER — Encounter (HOSPITAL_COMMUNITY): Payer: Self-pay | Admitting: Registered Nurse

## 2014-01-01 DIAGNOSIS — R443 Hallucinations, unspecified: Secondary | ICD-10-CM

## 2014-01-01 NOTE — Consult Note (Signed)
  Psychiatric Specialty Exam: Physical Exam  ROS  Blood pressure 115/75, pulse 77, temperature 98 F (36.7 C), temperature source Oral, resp. rate 20, SpO2 99.00%.There is no height or weight on file to calculate BMI.  General Appearance: Casual  Eye Contact::  Good  Speech:  Clear and Coherent  Volume:  Normal  Mood:  Irritable  Affect:  Congruent  Thought Process:  Coherent  Orientation:  Full (Time, Place, and Person)  Thought Content:  Hallucinations: Auditory  Suicidal Thoughts:  No  Homicidal Thoughts:  No  Memory:  Immediate;   Good Recent;   Good Remote;   Good  Judgement:  Poor  Insight:  Lacking  Psychomotor Activity:  Normal  Concentration:  Good  Recall:  Good  Akathisia:  Negative  Handed:  Right  AIMS (if indicated):     Assets:  Communication Skills Social Support  Sleep:   adequate  [facility] still hears voices.  Says he has heard them since he was a teen ager.  They agitate him sometimes more than other times and recently they have been worse.  He will not take medicines saying they are poison.  Says " I am gifted and see and hear things other people can't see or hear." The plan is to send him to [facility] inpatient for treatment of his psychosis.

## 2014-01-01 NOTE — Progress Notes (Signed)
Patient was given coloring pages as a coping skill.

## 2014-01-01 NOTE — Progress Notes (Signed)
Pt accepted to [facility] by Dr. Shelbie Proctor, patient to be transported under IVC by sheriff. RN can call report to [phone].   Noreene Larsson [phone]  ED CSW 01/01/2014 1018am

## 2014-02-06 ENCOUNTER — Emergency Department (HOSPITAL_COMMUNITY)
Admission: EM | Admit: 2014-02-06 | Discharge: 2014-02-07 | Disposition: A | Discharge status: Other Acute Inpatient Hospital | Payer: BC Managed Care – PPO | Attending: Emergency Medicine | Admitting: Emergency Medicine

## 2014-02-06 ENCOUNTER — Encounter (HOSPITAL_COMMUNITY): Payer: Self-pay | Admitting: Emergency Medicine

## 2014-02-06 DIAGNOSIS — F17200 Nicotine dependence, unspecified, uncomplicated: Secondary | ICD-10-CM | POA: Insufficient documentation

## 2014-02-06 DIAGNOSIS — Z8659 Personal history of other mental and behavioral disorders: Secondary | ICD-10-CM | POA: Insufficient documentation

## 2014-02-06 DIAGNOSIS — R45850 Homicidal ideations: Secondary | ICD-10-CM

## 2014-02-06 DIAGNOSIS — R45851 Suicidal ideations: Secondary | ICD-10-CM

## 2014-02-06 DIAGNOSIS — Z982 Presence of cerebrospinal fluid drainage device: Secondary | ICD-10-CM | POA: Insufficient documentation

## 2014-02-06 DIAGNOSIS — Z880 Allergy status to penicillin: Secondary | ICD-10-CM | POA: Insufficient documentation

## 2014-02-06 DIAGNOSIS — J45909 Unspecified asthma, uncomplicated: Secondary | ICD-10-CM | POA: Insufficient documentation

## 2014-02-06 DIAGNOSIS — Z8669 Personal history of other diseases of the nervous system and sense organs: Secondary | ICD-10-CM | POA: Insufficient documentation

## 2014-02-06 LAB — COMPREHENSIVE METABOLIC PANEL
ALT: 24 U/L (ref 0–53)
AST: 30 U/L (ref 0–37)
Albumin: 4.7 g/dL (ref 3.5–5.2)
Alkaline Phosphatase: 87 U/L (ref 39–117)
Anion gap: 18 — ABNORMAL HIGH (ref 5–15)
BUN: 11 mg/dL (ref 6–23)
CO2: 24 mEq/L (ref 19–32)
Calcium: 9.9 mg/dL (ref 8.4–10.5)
Chloride: 99 mEq/L (ref 96–112)
Creatinine, Ser: 0.88 mg/dL (ref 0.50–1.35)
GFR calc Af Amer: >90 mL/min (ref >90)
GFR calc non Af Amer: >90 mL/min (ref >90)
Glucose, Bld: 85 mg/dL (ref 70–99)
Potassium: 4.2 mEq/L (ref 3.7–5.3)
Sodium: 141 mEq/L (ref 137–147)
Total Bilirubin: 1 mg/dL (ref 0.3–1.2)
Total Protein: 7.8 g/dL (ref 6.0–8.3)

## 2014-02-06 LAB — CBC
HCT: 46.8 % (ref 39.0–52.0)
Hemoglobin: 15.8 g/dL (ref 13.0–17.0)
MCH: 28.9 pg (ref 26.0–34.0)
MCHC: 33.8 g/dL (ref 30.0–36.0)
MCV: 85.7 fL (ref 78.0–100.0)
Platelets: 212 10*3/uL (ref 150–400)
RBC: 5.46 MIL/uL (ref 4.22–5.81)
RDW: 12 % (ref 11.5–15.5)
WBC: 6.6 10*3/uL (ref 4.0–10.5)

## 2014-02-06 LAB — SALICYLATE LEVEL: Salicylate Lvl: <2 mg/dL — ABNORMAL LOW (ref 2.8–20.0)

## 2014-02-06 LAB — ETHANOL: Alcohol, Ethyl (B): <11 mg/dL (ref 0–11)

## 2014-02-06 LAB — ACETAMINOPHEN LEVEL: Acetaminophen (Tylenol), Serum: <15 ug/mL (ref 10–30)

## 2014-02-06 NOTE — BH Assessment (Addendum)
Tele Assessment Note   Justin Chaney is an 18 y.o. male. Pt with significant previous mental health history presented to [facility] today reporting he was having homicidal thoughts, thoughts of torturing people, and cannibalism.  Upon assessment pt is only partially cooperative and stated in response to a number of questions: "I prefer not to say."  Pt did say that he is having "dark thoughts of torturing people and cannibalism" which had been going on "my whole life."  Pt said his family told him he needs help and he wants help "before I act on the thoughts."  Pt endorsed HI but refused to state what his plan was or who he intended to harm.  Pt also endorsed SI yesterday, but denied plan.  Pt did not really answer the question about AV hallucinations--past record shows this has been an issue.  Pt denied substance use--during ED visit 12/2013 pt was positive for amphetamines and THC.  No UDS back for this visit.  Pt mother was contacted and she reported she and her husband do not allow pt to stay with them because he won't follow any rules.  She had not seen him for 10 days when he showed up late last night and then asked to go to hospital today.  She reported he was psychotic during last visit in June but she did not notice him talking to himself like that this time.  She reports he has been having problems "for a long time."  Axis I: Psychotic Disorder NOS Axis II: Deferred Axis III:  Past Medical History  Diagnosis Date  . Asthma   . Seizures   . Brain ventricular shunt obstruction     hydrochelpis  . Depression    Axis IV: economic problems, housing problems, occupational problems and problems with primary support group Axis V: 21-30 behavior considerably influenced by delusions or hallucinations OR serious impairment in judgment, communication OR inability to function in almost all areas  Past Medical History:  Past Medical History  Diagnosis Date  . Asthma   . Seizures   . Brain ventricular  shunt obstruction     hydrochelpis  . Depression     Past Surgical History  Procedure Laterality Date  . Tonsillectomy    . Ventriculo-peritoneal shunt placement / laparoscopic insertion peritoneal catheter      Family History: History reviewed. No pertinent family history.  Social History:  reports that he has been smoking Cigarettes.  He has been smoking about 2.00 packs per day. He does not have any smokeless tobacco history on file. He reports that he drinks about 2.4 ounces of alcohol per week. He reports that he uses illicit drugs (Marijuana) about 7 times per week.  Additional Social History:  Alcohol / Drug Use Pain Medications: Pt denies use, however, UDS not back yet.  Pt had THC, amphetamines in UDS 12/2013. History of alcohol / drug use?: Yes Substance #1 Name of Substance 1: Uncertain: Pt denies use but has history of use.    CIWA: CIWA-Ar BP: 117/88 mmHg Pulse Rate: 71 COWS:    Allergies:  Allergies  Allergen Reactions  . Amoxicillin Other (See Comments)    Reaction unknown  . Penicillins Other (See Comments)    Reaction unknown    Home Medications:  (Not in a hospital admission)  OB/GYN Status:  No LMP for male patient.  General Assessment Data Location of Assessment: [facility] ED ACT Assessment: Yes Is this a Tele or Face-to-Face Assessment?: Tele Assessment Is this an Initial  Assessment or a Re-assessment for this encounter?: Initial Assessment Living Arrangements: Alone Can pt return to current living arrangement?: Yes Admission Status: Voluntary Is patient capable of signing voluntary admission?: No Transfer from: [facility] Referral Source: Self/Family/Friend     Universal Living Arrangements: Alone Name of Psychiatrist: None reported  Name of Therapist: none reported     Risk to self Suicidal Ideation: No-Not Currently/Within Last 6 Months (PT reports SI last night.) Suicidal Intent: No Is patient at risk for suicide?:  Yes Suicidal Plan?: No Access to Means: No What has been your use of drugs/alcohol within the last 12 months?: uncertain Previous Attempts/Gestures: Yes How many times?: 3 Triggers for Past Attempts: Unknown (pt would not say) Intentional Self Injurious Behavior: None Family Suicide History: Unknown Recent stressful life event(s): Other (Comment) ("I prefer not to say") Persecutory voices/beliefs?: Yes (by history, uncertain currently) Depression: No Substance abuse history and/or treatment for substance abuse?:  (unknown) Suicide prevention information given to non-admitted patients: Not applicable  Risk to Others Homicidal Ideation: Yes-Currently Present Thoughts of Harm to Others: Yes-Currently Present Comment - Thoughts of Harm to Others: pt refuses to give specifics but states wants to torture people Current Homicidal Intent: Yes-Currently Present Current Homicidal Plan: Yes-Currently Present Describe Current Homicidal Plan: pt refusing to say Access to Homicidal Means: Yes Describe Access to Homicidal Means: "I prefer to use my hands." Identified Victim: pt would not identify, "Couple of people" History of harm to others?: Yes Assessment of Violence:  (pt would not say) Violent Behavior Description: unknown Does patient have access to weapons?: No Criminal Charges Pending?: No Does patient have a court date: No  Psychosis Hallucinations:  (possible--pt denied but has history of psychosis) Delusions: Persecutory (by history--unknown currently)  Mental Status Report Appear/Hygiene: In scrubs Eye Contact: Good Motor Activity: Restlessness Speech: Logical/coherent Level of Consciousness: Alert Mood: Suspicious Affect: Other (Comment) (agitated) Anxiety Level: Moderate Thought Processes: Relevant Judgement: Impaired Orientation: Person;Place;Time;Situation Obsessive Compulsive Thoughts/Behaviors: Moderate  Cognitive Functioning Concentration: Unable to  Assess Memory: Recent Intact;Remote Intact IQ: Average Insight: Fair Impulse Control: Unable to Assess Appetite: Fair Weight Loss: 0 Weight Gain: 0 Sleep: Decreased Total Hours of Sleep: 6 (wakes frequently) Vegetative Symptoms: None  ADLScreening [facility] Assessment Services) Patient's cognitive ability adequate to safely complete daily activities?: Yes Patient able to express need for assistance with ADLs?: Yes Independently performs ADLs?: Yes (appropriate for developmental age)  Prior Inpatient Therapy Prior Inpatient Therapy: Yes Prior Therapy Dates: 11/2013 Prior Therapy Facilty/Provider(s): [facility] Reason for Treatment: psychosis  Prior Outpatient Therapy Prior Outpatient Therapy: No  ADL Screening (condition at time of admission) Patient's cognitive ability adequate to safely complete daily activities?: Yes Patient able to express need for assistance with ADLs?: Yes Independently performs ADLs?: Yes (appropriate for developmental age)       Abuse/Neglect Assessment (Assessment to be complete while patient is alone) Physical Abuse: Denies Verbal Abuse: Denies Sexual Abuse: Denies Exploitation of patient/patient's resources: Denies Self-Neglect: Denies Values / Beliefs Cultural Requests During Hospitalization: None Spiritual Requests During Hospitalization: None   Advance Directives (For Healthcare) Advance Directive: Patient does not have advance directive;Patient would not like information    Additional Information 1:1 In Past 12 Months?:  (unknown) CIRT Risk: No Elopement Risk: No Does patient have medical clearance?: Yes     Disposition: Discussed pt with Dr Stark Jock of [facility], who agreed with plan that pt be referred for inpt psychiatric treatment.  Discussed pt with Patriciaann Clan of [facility] who  reports pt meets inpt criteria and TTS should talk with [facility] about whether pt can be admitted to 400 hall. Disposition Initial Assessment Completed for this Encounter:  Yes Disposition of Patient: Inpatient treatment program Type of inpatient treatment program: Adult  Joanne Chars 02/06/2014 10:01 PM

## 2014-02-06 NOTE — ED Notes (Signed)
Pt reports being suicidal and homicidal. Pt is having thoughts of wanting to torture, kill people and cannibalism.

## 2014-02-06 NOTE — ED Notes (Signed)
Pt in scrubs, called security to wand pt and called for sitter, charge aware.

## 2014-02-06 NOTE — ED Provider Notes (Signed)
CSN: [id number]     Arrival date & time 02/06/14  1825 History   First MD Initiated Contact with Patient 02/06/14 1945     Chief Complaint  Patient presents with  . Homicidal  . Medical Clearance     (Consider location/radiation/quality/duration/timing/severity/associated sxs/prior Treatment) HPI Comments: Patient is a 18 year old male with history of seizure disorder and VP shunt placement. He presents for evaluation of suicidal and homicidal ideation. He states he is having thoughts of hurting himself and hurting and eating others. He denies any illicit drug use. He states that he drinks an occasional beer but nothing regularly. He denies previous hospitalizations and denies any prior psychiatric history.  Patient is a 18 y.o. male presenting with mental health disorder. The history is provided by the patient.  Mental Health Problem Presenting symptoms: homicidal ideas and suicidal thoughts   Degree of incapacity (severity):  Moderate Onset quality:  Gradual   Past Medical History  Diagnosis Date  . Asthma   . Seizures   . Brain ventricular shunt obstruction     hydrochelpis  . Depression    Past Surgical History  Procedure Laterality Date  . Tonsillectomy    . Ventriculo-peritoneal shunt placement / laparoscopic insertion peritoneal catheter     History reviewed. No pertinent family history. History  Substance Use Topics  . Smoking status: Current Every Day Smoker -- 2.00 packs/day    Types: Cigarettes  . Smokeless tobacco: Not on file  . Alcohol Use: 2.4 oz/week    4 Cans of beer per week     Comment: 4-8 40 oz.on the weekend    Review of Systems  Psychiatric/Behavioral: Positive for suicidal ideas and homicidal ideas.  All other systems reviewed and are negative.     Allergies  Amoxicillin and Penicillins  Home Medications   Prior to Admission medications   Not on File   BP 126/69  Pulse 99  Temp(Src) 98.1 F (36.7 C) (Oral)  Resp 18  SpO2  98% Physical Exam  Nursing note and vitals reviewed. Constitutional: He is oriented to person, place, and time. He appears well-developed and well-nourished. No distress.  HENT:  Head: Normocephalic and atraumatic.  Mouth/Throat: Oropharynx is clear and moist.  Neck: Normal range of motion. Neck supple.  Cardiovascular: Normal rate, regular rhythm and normal heart sounds.   No murmur heard. Pulmonary/Chest: Effort normal and breath sounds normal. No respiratory distress. He has no wheezes.  Abdominal: Soft. Bowel sounds are normal.  Musculoskeletal: Normal range of motion. He exhibits no edema.  Neurological: He is alert and oriented to person, place, and time.  Skin: Skin is warm and dry. He is not diaphoretic.  Psychiatric: He has a normal mood and affect. His speech is normal. Cognition and memory are normal. He expresses impulsivity and inappropriate judgment. He expresses homicidal and suicidal ideation.    ED Course  Procedures (including critical care time) Labs Review Labs Reviewed  CBC  ACETAMINOPHEN LEVEL  COMPREHENSIVE METABOLIC PANEL  ETHANOL  SALICYLATE LEVEL  URINE RAPID DRUG SCREEN (HOSP PERFORMED)    Imaging Review No results found.   EKG Interpretation None      MDM   Final diagnoses:  None    Patient presents with suicidal and homicidal ideation. He is evaluated by TTS and a bed search is underway.    Veryl Speak, MD 02/06/14 [phone]

## 2014-02-07 ENCOUNTER — Encounter (HOSPITAL_COMMUNITY): Payer: Self-pay | Admitting: *Deleted

## 2014-02-07 ENCOUNTER — Inpatient Hospital Stay (HOSPITAL_COMMUNITY)
Admission: AD | Admit: 2014-02-07 | Discharge: 2014-02-14 | DRG: 885 | Disposition: A | Discharge status: Home / Self Care | Payer: BC Managed Care – PPO | Source: Intra-hospital | Attending: Psychiatry | Admitting: Psychiatry

## 2014-02-07 DIAGNOSIS — F411 Generalized anxiety disorder: Secondary | ICD-10-CM | POA: Diagnosis present

## 2014-02-07 DIAGNOSIS — F319 Bipolar disorder, unspecified: Principal | ICD-10-CM | POA: Diagnosis present

## 2014-02-07 DIAGNOSIS — G4700 Insomnia, unspecified: Secondary | ICD-10-CM | POA: Diagnosis present

## 2014-02-07 DIAGNOSIS — F209 Schizophrenia, unspecified: Secondary | ICD-10-CM | POA: Diagnosis present

## 2014-02-07 DIAGNOSIS — R45850 Homicidal ideations: Secondary | ICD-10-CM

## 2014-02-07 DIAGNOSIS — F17200 Nicotine dependence, unspecified, uncomplicated: Secondary | ICD-10-CM | POA: Diagnosis present

## 2014-02-07 DIAGNOSIS — F19288 Other psychoactive substance dependence with other psychoactive substance-induced disorder: Secondary | ICD-10-CM

## 2014-02-07 DIAGNOSIS — Z559 Problems related to education and literacy, unspecified: Secondary | ICD-10-CM

## 2014-02-07 DIAGNOSIS — F913 Oppositional defiant disorder: Secondary | ICD-10-CM | POA: Diagnosis present

## 2014-02-07 DIAGNOSIS — R4586 Emotional lability: Secondary | ICD-10-CM | POA: Diagnosis present

## 2014-02-07 DIAGNOSIS — F39 Unspecified mood [affective] disorder: Secondary | ICD-10-CM

## 2014-02-07 DIAGNOSIS — F29 Unspecified psychosis not due to a substance or known physiological condition: Secondary | ICD-10-CM

## 2014-02-07 DIAGNOSIS — F1210 Cannabis abuse, uncomplicated: Secondary | ICD-10-CM | POA: Diagnosis present

## 2014-02-07 DIAGNOSIS — F22 Delusional disorders: Secondary | ICD-10-CM | POA: Diagnosis present

## 2014-02-07 DIAGNOSIS — F1994 Other psychoactive substance use, unspecified with psychoactive substance-induced mood disorder: Secondary | ICD-10-CM | POA: Diagnosis present

## 2014-02-07 DIAGNOSIS — J45909 Unspecified asthma, uncomplicated: Secondary | ICD-10-CM | POA: Diagnosis present

## 2014-02-07 HISTORY — DX: Bipolar disorder, unspecified: F31.9

## 2014-02-07 LAB — RAPID URINE DRUG SCREEN, HOSP PERFORMED
Amphetamines: NOT DETECTED
Barbiturates: NOT DETECTED
Benzodiazepines: NOT DETECTED
Cocaine: NOT DETECTED
Opiates: NOT DETECTED
Tetrahydrocannabinol: NOT DETECTED

## 2014-02-07 MED ORDER — MAGNESIUM HYDROXIDE 400 MG/5ML PO SUSP: 30.0000 mL | Freq: Every day | ORAL | Status: DC | PRN

## 2014-02-07 MED ORDER — TRAZODONE HCL 50 MG PO TABS
50.0000 mg | ORAL_TABLET | Freq: Every evening | ORAL | Status: DC | PRN
  Filled 2014-02-07: qty 1

## 2014-02-07 MED ORDER — ALUM & MAG HYDROXIDE-SIMETH 200-200-20 MG/5ML PO SUSP: 30.0000 mL | ORAL | Status: DC | PRN

## 2014-02-07 MED ORDER — ACETAMINOPHEN 325 MG PO TABS: 650.0000 mg | ORAL_TABLET | Freq: Four times a day (QID) | ORAL | Status: DC | PRN

## 2014-02-07 MED ORDER — LORAZEPAM 1 MG PO TABS
1.0000 mg | ORAL_TABLET | Freq: Four times a day (QID) | ORAL | Status: DC | PRN
  Administered 2014-02-08 – 2014-02-10 (×3): 1 mg via ORAL
  Filled 2014-02-07 (×4): qty 1

## 2014-02-07 NOTE — Progress Notes (Signed)
Pt was admitted on to the 400 hall. Pt calm and cooperative. Pt asked lots of questions and seemed comfortable with responses. Pt has old scars and healed wounds on skin. No draining or bleeding observed. Pt completed admission paperwork and signed. Pt belongings were stored in locker #55. Pt ate lunch with the rest of the other patients and reports that he is antisocial and chooses not to go to group.   When asked about SI/HI/AH/VH, Pt reports that thoughts to harm himself come and go and they have done so for his whole life. Pt also reports that he has thoughts to harm others from time to time. He expresses that he can hurt someone really badly if they "piss him off". When asked how, Pt admits that he wants to eat people. Pt says "The thought makes my blood boil" as he laughs. Pt also says "with the cannibalism thing, it hasn't got to the point for me to act on it..yet."   Pt isolates to room and has been resting/napping. No current concerns, Pt does currently contract for safety. RN and staff will continue to monitor.

## 2014-02-07 NOTE — Progress Notes (Signed)
Patient ID: Anh Mangano, male   DOB: [birth year], 18 y.o.   MRN: [id number] D: Pt reported to writer suicidal and homicidal ideation with plan to cut into human flesh and eat. Pt stated he has these thoughts as a child and has increasing worsen. Pt has grin on face talking about tasting someone's flesh and stated "it makes my blood boil with thought of it". When asked if he will hurt his roommate pt stated "not yet". Pt report being at [employer] as a child and [employer] at age 74. A: Pt refused medications stating it is poison. Pt placed on 1:1 observation for SI/HI R: Pt in bed awake with sitter at bedside. Pt is safe. 1:1 continues

## 2014-02-07 NOTE — ED Notes (Signed)
[facility]  CALLED  FOR  TRANSPORT   

## 2014-02-07 NOTE — ED Notes (Signed)
PATIENT HAS PLACED HIS LUNCH ORDER

## 2014-02-07 NOTE — Progress Notes (Signed)
D: Patient calm and cooperative on the unit tonight.  Patient states he does not and will take medications because they are poison.  D: A: Staff to monitor Q 15 mins for safety.  Encouragement and support offered.  Scheduled medications administered  R: Patient remains safe on the unit.

## 2014-02-07 NOTE — ED Notes (Signed)
SPOKE TO PATIENT ABOUT GOING TO BH AND HAVING A ROOM MATE. HE STATES HE IS AGREEABLE. STATES HE NEEDS TO GET SOME HELP BEFORE HE ACTS OUT. PATIENT AGREEABLE TO USING HIS COMMUNICATION SKILLS IF HE BEGINS TO FEEL HIS BOUNDARIES ARE BEING CROSSED OR HE IS UNCOMFORTABLE AND ADDRESS THE SITUATION WITH HIS NURSE. HE IS AGREEABLE.

## 2014-02-08 ENCOUNTER — Encounter (HOSPITAL_COMMUNITY): Payer: Self-pay | Admitting: Psychiatry

## 2014-02-08 DIAGNOSIS — R45850 Homicidal ideations: Secondary | ICD-10-CM

## 2014-02-08 DIAGNOSIS — F39 Unspecified mood [affective] disorder: Secondary | ICD-10-CM

## 2014-02-08 DIAGNOSIS — F1994 Other psychoactive substance use, unspecified with psychoactive substance-induced mood disorder: Secondary | ICD-10-CM | POA: Diagnosis present

## 2014-02-08 DIAGNOSIS — F29 Unspecified psychosis not due to a substance or known physiological condition: Secondary | ICD-10-CM

## 2014-02-08 MED ORDER — ARIPIPRAZOLE 5 MG PO TABS
5.0000 mg | ORAL_TABLET | Freq: Every day | ORAL | Status: DC
  Administered 2014-02-09 – 2014-02-10 (×2): 5 mg via ORAL
  Filled 2014-02-08 (×5): qty 1

## 2014-02-08 MED ORDER — ARIPIPRAZOLE 5 MG PO TABS
5.0000 mg | ORAL_TABLET | Freq: Once | ORAL | Status: AC
  Administered 2014-02-08: 5 mg via ORAL
  Filled 2014-02-08 (×2): qty 1

## 2014-02-08 MED ORDER — CARBAMAZEPINE ER 200 MG PO TB12
200.0000 mg | ORAL_TABLET | Freq: Two times a day (BID) | ORAL | Status: DC
  Administered 2014-02-08 – 2014-02-13 (×11): 200 mg via ORAL
  Filled 2014-02-08 (×10): qty 1
  Filled 2014-02-08: qty 6
  Filled 2014-02-08 (×4): qty 1
  Filled 2014-02-08: qty 6
  Filled 2014-02-08 (×3): qty 1

## 2014-02-08 MED ORDER — OLANZAPINE 10 MG PO TBDP
10.0000 mg | ORAL_TABLET | Freq: Three times a day (TID) | ORAL | Status: DC | PRN
  Administered 2014-02-12: 10 mg via ORAL
  Filled 2014-02-08 (×3): qty 1

## 2014-02-08 NOTE — Progress Notes (Signed)
[place] [facility] 1:1 Observation Documentation  For the first (8) hours following discontinuation of 1:1 precautions, a progress note entry by nursing staff should be documented at least every 2 hours, reflecting the patient's behavior, condition, mood, and conversation.  Use the progress notes for additional entries.  Time 1:1 discontinued:  1120  Patient's Behavior:  Calm resting quietly in bed  Patient's Condition:  In no acute distress  Patient's Conversation:  Pt currently resting quietly.No voiced concerns  Justin Chaney 02/08/2014, 1:13 PM
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
## 2014-02-08 NOTE — Progress Notes (Signed)
[facility] 1:1 Observation Documentation  For the first (8) hours following discontinuation of 1:1 precautions, a progress note entry by nursing staff should be documented at least every 2 hours, reflecting the patient's behavior, condition, mood, and conversation.  Use the progress notes for additional entries.  Time 1:1 discontinued:  1120  Patient's Behavior:  Cooperative and redirectable  Patient's Condition:  In no acute distress  Patient's Conversation:  Currently in hallway and dayroom area socializing with peers. He denies any acute concerns  Leonia Reader 02/08/2014, 5:14 PM

## 2014-02-08 NOTE — Progress Notes (Signed)
[facility] 1:1 Observation Documentation  For the first (8) hours following discontinuation of 1:1 precautions, a progress note entry by nursing staff should be documented at least every 2 hours, reflecting the patient's behavior, condition, mood, and conversation.  Use the progress notes for additional entries.  Time 1:1 discontinued:  1120  Patient's Behavior:  Calm cooperative  Patient's Condition:  In no acute distress  Patient's Conversation:  No voiced concerns at this time.   Leonia Reader 02/08/2014, 3:12 PM

## 2014-02-08 NOTE — Progress Notes (Signed)
Patient ID: Justin Chaney, male   DOB: 12-22-1995, 18 y.o.   MRN: [id number] D: Patient reports he is doing well but sleepy. Pt stated the medication is making him feel drowsy. Pt reports he took earlier medication because he did not want medication to be forced on him. Pt endorse SI/HI saying he "always having these feeling but will not hurt anyone". Pt socializing with peers on unit. Cooperative with assessment. No acute distressed noted at this time.   A: Met with pt 1:1.  Writer encouraged pt to discuss feelings. Pt encourage to raise from slowly from sitting position. Pt encouraged to come to staff with any questions or concerns.   R: Patient is safe on the unit. Continue current POC.
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
## 2014-02-08 NOTE — Tx Team (Signed)
  Interdisciplinary Treatment Plan Update   Date Reviewed:  02/08/2014  Time Reviewed:  8:02 AM  Progress in Treatment:   Attending groups: No Participating in groups: No Taking medication as prescribed: Yes  Tolerating medication: Yes Family/Significant other contact made: Yes  Patient understands diagnosis: Yes AEB stating "My anger really gets me in trouble." Discussing patient identified problems/goals with staff: Yes  See initial care plan Medical problems stabilized or resolved: Yes Denies suicidal/homicidal ideation: No  "I always have suicidal thoughts and thoughts of hurting, killing and eating others, but I am not homicidal or suicidal." Patient has not harmed self or others: Yes  For review of initial/current patient goals, please see plan of care.  Estimated Length of Stay:  4-5 days  Reason for Continuation of Hospitalization: Medication stabilization Other; describe Paranoia, delusions, mood lability  New Problems/Goals identified:  N/A  Discharge Plan or Barriers:   return home, follow up outpt  Additional Comments:  Justin Chaney is an 18 y.o. male. Pt with significant previous mental health history presented to [facility] today reporting he was having homicidal thoughts, thoughts of torturing people, and cannibalism. Upon assessment pt is only partially cooperative and stated in response to a number of questions: "I prefer not to say." Pt did say that he is having "dark thoughts of torturing people and cannibalism" which had been going on "my whole life." Pt said his family told him he needs help and he wants help "before I act on the thoughts." Pt endorsed HI but refused to state what his plan was or who he intended to harm. Pt also endorsed SI yesterday, but denied plan. Pt did not really answer the question about AV hallucinations--past record shows this has been an issue.   Attendees:  Signature: Corena Pilgrim, MD 02/08/2014 8:02 AM   Signature: Ripley Fraise, LCSW  02/08/2014 8:02 AM  Signature: Elmarie Shiley, NP 02/08/2014 8:02 AM  Signature: Mayra Neer, RN 02/08/2014 8:02 AM  Signature: Darrol Angel, RN 02/08/2014 8:02 AM  Signature:  02/08/2014 8:02 AM  Signature:   02/08/2014 8:02 AM  Signature:    Signature:    Signature:    Signature:    Signature:    Signature:      Scribe for Treatment Team:   Ripley Fraise, LCSW  02/08/2014 8:02 AM

## 2014-02-08 NOTE — [facility] Suicide Risk Assessment (Signed)
   Nursing information obtained from:    Demographic factors:  Male;Adolescent or young adult;Unemployed Current Mental Status:    Loss Factors:    Historical Factors:    Risk Reduction Factors:    Total Time spent with patient: 30 minutes  CLINICAL FACTORS:   Severe Anxiety and/or Agitation Labile mood Epilepsy Currently Psychotic  Psychiatric Specialty Exam: Physical Exam  Psychiatric: His affect is angry and labile. His speech is rapid and/or pressured. He is agitated, aggressive, hyperactive and actively hallucinating. Thought content is paranoid. Cognition and memory are normal. He expresses impulsivity.    Review of Systems  Constitutional: Negative.   HENT: Negative.   Eyes: Negative.   Respiratory: Negative.   Cardiovascular: Negative.   Gastrointestinal: Negative.   Genitourinary: Negative.   Musculoskeletal: Negative.   Skin: Negative.   Neurological: Negative.   Endo/Heme/Allergies: Negative.   Psychiatric/Behavioral: Positive for hallucinations. The patient is nervous/anxious and has insomnia.     Blood pressure 110/79, pulse 71, temperature 98.2 F (36.8 C), temperature source Oral, resp. rate 16, height 5' 2.75" (1.594 m), weight 80.74 kg (178 lb).Body mass index is 31.78 kg/(m^2).  General Appearance: Fairly Groomed  Engineer, water::  Good  Speech:  Pressured  Volume:  Increased  Mood:  Angry and Irritable  Affect:  Labile  Thought Process:  Circumstantial and Disorganized  Orientation:  Full (Time, Place, and Person)  Thought Content:  Delusions and Hallucinations: Auditory  Suicidal Thoughts:  No  Homicidal Thoughts:  Yes.  without intent/plan  Memory:  Immediate;   Fair Recent;   Fair Remote;   Fair  Judgement:  Impaired  Insight:  Lacking  Psychomotor Activity:  Increased  Concentration:  Fair  Recall:  [employer] of Knowledge:Fair  Language: Fair  Akathisia:  No  Handed:  Right  AIMS (if indicated):     Assets:  Communication Skills Desire  for Improvement Physical Health  Sleep:  Number of Hours: 5.5   Musculoskeletal: Strength & Muscle Tone: within normal limits Gait & Station: normal Patient leans: N/A  COGNITIVE FEATURES THAT CONTRIBUTE TO RISK:  Closed-mindedness Polarized thinking    SUICIDE RISK:   Minimal: No identifiable suicidal ideation.  Patients presenting with no risk factors but with morbid ruminations; may be classified as minimal risk based on the severity of the depressive symptoms  PLAN OF CARE:1. Admit for crisis management and stabilization. 2. Medication management to reduce current symptoms to base line and improve the  patient's overall level of functioning 3. Treat health problems as indicated. 4. Develop treatment plan to decrease risk of relapse upon discharge and the need for  readmission. 5. Psycho-social education regarding relapse prevention and self care. 6. Health care follow up as needed for medical problems. 7. Restart home medications where appropriate.   I certify that inpatient services furnished can reasonably be expected to improve the patient's condition.  Corena Pilgrim, MD 02/08/2014, 10:22 AM

## 2014-02-08 NOTE — Progress Notes (Signed)
Patient ID: Justin Chaney, male   DOB: 1996/01/03, 18 y.o.   MRN: [id number] 1:1 notes  02/08/2014 @ 0200 D: Patient up to use restroom and back to bed. Respiration regular and unlabored. No sign of distress noted at this time A: 1:1 observation for safety R: Patient remains asleep. Sitter at bedside. 1:1 continues

## 2014-02-08 NOTE — Progress Notes (Signed)
Patient ID: Justin Chaney, male   DOB: 10/03/1995, 18 y.o.   MRN: [id number] 1:1 notes  02/08/2014 @ 0045 D: Patient in bed sleeping. Respiration regular and unlabored. No sign of distress noted at this time A: 1:1 observation for safety R: Patient is asleep. Sitter at bedside. 1:1 continues

## 2014-02-08 NOTE — Progress Notes (Signed)
Patient ID: Justin Chaney, male   DOB: 05-12-1996, 18 y.o.   MRN: [id number] 1:1 notes  02/08/2014 @ 0600 D: Patient in bed sleeping. Respiration regular and unlabored. No sign of distress noted at this time A: 1:1 observation for safety R: Patient is asleep. Sitter at bedside. 1:1 continues

## 2014-02-08 NOTE — [facility] Group Notes (Signed)
[facility] LCSW Group Therapy  02/08/2014  1:05 PM  Type of Therapy:  Group therapy  Participation Level:  Active  Participation Quality:  Attentive  Affect:  Flat  Cognitive:  Oriented  Insight:  Limited  Engagement in Therapy:  Limited  Modes of Intervention:  Discussion, Socialization  Summary of Progress/Problems:  Chaplain was here to lead a group on themes of hope and courage.  Did not attend.  Justin Chaney B 02/08/2014 1:55 PM

## 2014-02-08 NOTE — Progress Notes (Signed)
Patient ID: Justin Chaney, male   DOB: Jan 06, 1996, 18 y.o.   MRN: [id number] Orders received to d/c one to one monitoring. Pt is able to contract for safety at this time , and agrees to let staff know if he has any thoughts to harms self or others. Pt moved to private room. Will continue to monitor q15 minutes for safety.

## 2014-02-08 NOTE — Progress Notes (Signed)
Adult Psychoeducational Group Note  Date:  02/08/2014 Time:  8:49 PM  Group Topic/Focus:  Wrap-Up Group:   The focus of this group is to help patients review their daily goal of treatment and discuss progress on daily workbooks.  Participation Level:  Did Not Attend  Participation Quality:  Drowsy  Affect:  Flat and Irritable  Cognitive:  Confused  Insight: Limited  Engagement in Group:  Limited  Modes of Intervention:  NONE  Additional Comments:  Pt did not come to group Pt decide to stay sleep in his bedroom   Karolina Zamor R 02/08/2014, 8:49 PM

## 2014-02-08 NOTE — Progress Notes (Signed)
Patient ID: Justin Chaney, male   DOB: October 14, 1995, 18 y.o.   MRN: [id number] 1-1 Monitoring Note. D. 1.1. Monitoring ordered for patient safety. Pt reports persistent thoughts ''of hurting people and eating their flesh.'' 1.1 ordered to maintain safety. A. 1.1. At bedside. R. Patient is safe, is not exhibiting any aggressive behaviors at this time. Will continue to monitor as ordered.

## 2014-02-09 NOTE — Progress Notes (Signed)
Patient ID: Ramelo Oetken, male   DOB: 02-Aug-1996, 18 y.o.   MRN: [id number] Psychoeducational Group Note  Date:  02/09/2014 Time:  0930  Group Topic/Focus:  healthy coping skills.   Participation Level: Did Not Attend  Participation Quality:  Not Applicable  Affect:  Not Applicable  Cognitive:  Not Applicable  Insight:  Not Applicable  Engagement in Group: Not Applicable  Additional Comments:  Did not attend.   Pricilla Larsson 02/09/2014, 10:15 AM

## 2014-02-09 NOTE — Progress Notes (Signed)
Patient ID: Justin Chaney, male   DOB: 1996-07-30, 18 y.o.   MRN: [id number] Psychoeducational Group Note  Date:  02/09/2014 Time:  0910  Group Topic/Focus:  inventory group   Participation Level: Did Not Attend  Participation Quality:  Not Applicable  Affect:  Not Applicable  Cognitive:  Not Applicable  Insight:  Not Applicable  Engagement in Group: Not Applicable  Additional Comments:  Did not attend.   Pricilla Larsson 02/09/2014, 10:14 AM

## 2014-02-09 NOTE — Progress Notes (Signed)
[facility] MD Progress Note  02/09/2014 12:46 PM Justin Chaney  MRN:  [id number] Subjective:   Patient states "I have these kind of thoughts all the time. It makes my blood boil over. I don't have any family. I have nobody. I feel irritable. I am only taking the medications so they won't be forced. The bad thoughts don't get better."   Objective:  Patient is seen and chart is reviewed. He has assessed in his room. The patient became irritable very quickly. He insists that he is not bothered by his "dark thoughts." When it was mentioned that his family was concerned patient denied having any. Patient has not been attending groups. Yesterday day he reported wanting medications to help with symptoms but now expresses a negative attitude about treatment. He has been receiving prn doses of ativan to help with his feelings of agitation.   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  Asthma    .  Seizures    .  Brain ventricular shunt obstruction      hydrochelpis   .  Depression    .  Bipolar disorder     AXIS IV: educational problems, housing problems, occupational problems and other psychosocial or environmental problems  AXIS V: 21-30 behavior considerably influenced by delusions or hallucinations OR serious impairment in judgment, communication OR inability to function in almost all areas  ADL's:  Intact  Sleep: Fair  Appetite:  Fair  Suicidal Ideation:  Denies Homicidal Ideation:  Continues to have strong thoughts of hurting others  AEB (as evidenced by):  Psychiatric Specialty Exam: Physical Exam  Review of Systems  Constitutional: Negative.   HENT: Negative.   Eyes: Negative.   Respiratory: Negative.   Cardiovascular: Negative.   Gastrointestinal: Negative.   Genitourinary: Negative.   Musculoskeletal: Negative.    Skin: Negative.   Neurological: Negative.   Endo/Heme/Allergies: Negative.   Psychiatric/Behavioral: Positive for depression and hallucinations. The patient is nervous/anxious.     Blood pressure 121/59, pulse 92, temperature 97.5 F (36.4 C), temperature source Oral, resp. rate 16, height 5' 2.75" (1.594 m), weight 80.74 kg (178 lb).Body mass index is 31.78 kg/(m^2).  General Appearance: Casual  Eye Contact::  Fair  Speech:  Pressured  Volume:  Normal  Mood:  Angry and Irritable  Affect:  Labile  Thought Process:  Circumstantial and Disorganized  Orientation:  Full (Time, Place, and Person)  Thought Content:  Delusions, Hallucinations: Auditory and Obsessions  Suicidal Thoughts:  No  Homicidal Thoughts:  Yes.  without intent/plan  Memory:  Immediate;   Fair Recent;   Fair Remote;   Fair  Judgement:  Impaired  Insight:  Lacking  Psychomotor Activity:  Increased  Concentration:  Fair  Recall:  [employer] of Knowledge:Fair  Language: Fair  Akathisia:  No  Handed:  Right  AIMS (if indicated):     Assets:  Communication Skills Desire for Improvement Physical Health  Sleep:  Number of Hours: 5   Musculoskeletal: Strength & Muscle Tone: within normal limits Gait & Station: normal Patient leans: N/A  Current Medications: Current Facility-Administered Medications  Medication Dose Route Frequency Provider Last Rate Last Dose  . acetaminophen (TYLENOL) tablet 650 mg  650 mg Oral Q6H PRN Evanna Glenda Chroman, NP      . alum & mag hydroxide-simeth (MAALOX/MYLANTA)  200-200-20 MG/5ML suspension 30 mL  30 mL Oral Q4H PRN Malena Peer, NP      . ARIPiprazole (ABILIFY) tablet 5 mg  5 mg Oral Daily Elmarie Shiley, NP   5 mg at 02/09/14 0819  . carbamazepine (TEGRETOL XR) 12 hr tablet 200 mg  200 mg Oral BID Mojeed Akintayo   200 mg at 02/09/14 0819  . LORazepam (ATIVAN) tablet 1 mg  1 mg Oral Q6H PRN Malena Peer, NP   1 mg at 02/09/14 0819  . magnesium hydroxide (MILK OF  MAGNESIA) suspension 30 mL  30 mL Oral Daily PRN Evanna Glenda Chroman, NP      . OLANZapine zydis (ZYPREXA) disintegrating tablet 10 mg  10 mg Oral Q8H PRN Mojeed Akintayo      . traZODone (DESYREL) tablet 50 mg  50 mg Oral QHS PRN,MR X 1 Evanna Glenda Chroman, NP        Lab Results: No results found for this or any previous visit (from the past 48 hour(s)).  Physical Findings: AIMS:  , ,  ,  ,    CIWA:  CIWA-Ar Total: 1 COWS:     Treatment Plan Summary: Daily contact with patient to assess and evaluate symptoms and progress in treatment Medication management  Plan: 1. Continue crisis management and stabilization.  2. Medication management:  -Continue Abilify 5 mg daily for psychosis -Continue Tegretol XR 200 mg BID for improved mood stability -Continue Zyprexa Zydis 10 mg every eight hours prn psychosis 3. Encouraged patient to attend groups and participate in group counseling sessions and activities.  4. Discharge plan in progress.  5. Continue current treatment plan.  6. Address health issues: Vitals reviewed and stable.   Medical Decision Making Problem Points:  Established problem, worsening (2), Review of last therapy session (1) and Review of psycho-social stressors (1) Data Points:  Review or order clinical lab tests (1) Review of medication regiment & side effects (2) Review of new medications or change in dosage (2)  I certify that inpatient services furnished can reasonably be expected to improve the patient's condition.   Burke Terry NP-C 02/09/2014, 12:46 PM

## 2014-02-09 NOTE — Progress Notes (Signed)
Patient ID: Justin Chaney, male   DOB: [birth year], 18 y.o.   MRN: [id number] D. Patient presents with irritable mood, affect labile. [name] continues to be easily agitated, and unable to tolerate much stimulation on the unit. He was cooperative with Probation officer with medications but refused to elaborate or discuss mood. Pt states '' Yeah I'm ill. But whatever, what pills do I have to take ? '' He has been isolative to his room throughout most of shift thus far. He remains suspicious/paranoid, looking over shoulder at times. A. Medications given as ordered.(including prn medication for agitation) Support and encouragement provided. Discussed above information with L. Davis NP. R. Patient is in no acute distress at this time. No further voiced concerns at this time. Will continue to monitor q 15 minutes for safety.

## 2014-02-09 NOTE — Progress Notes (Signed)
I agree with the assessment and plan.

## 2014-02-09 NOTE — Progress Notes (Signed)
Found patient resting in bed at start of shift. Overheard talking to self. Pt easily agitated and became irritable with basic assessment questions. When asked about SI and HI patient stated, "why does everyone keep asking me this? I have it all the time whether I take meds or not." "I always think about hurting people, all the time." He endorses AH but denies VH. Pt encouraged to speak with staff should he develop the urge to harm others. Offered medication for the evening/sleep however pt refused stating, "I already took all of my meds." Pt up for snack only and then returned to his room. He contracts for safety and remains safe. Will continue to monitor closely. Jamie Kato

## 2014-02-09 NOTE — Plan of Care (Signed)
Problem: Diagnosis: Increased Risk For Suicide Attempt Goal: STG-Patient Will Attend All Groups On The Unit Outcome: Not Progressing Pt resistant to treatment and not attending groups.   Problem: Alteration in thought process Goal: STG-Patient is able to discuss thoughts with staff Outcome: Adequate for Discharge Patient easily agitated by basic assessment questions. "Why does everyone keep asking me that?" Also at times will state, "I don't want to go into that."

## 2014-02-09 NOTE — Progress Notes (Signed)
[facility] 1:1 Observation Documentation  For the first (8) hours following discontinuation of 1:1 precautions, a progress note entry by nursing staff should be documented at least every 2 hours, reflecting the patient's behavior, condition, mood, and conversation.  Use the progress notes for additional entries.  Time 1:1 discontinued:  1120  Patient's Behavior:  Calm cooperative  Patient's Condition:  In no acute distress  Patient's Conversation:  No acute concerns voiced.  Leonia Reader 02/09/2014, 10:50 AM

## 2014-02-09 NOTE — [facility] Group Notes (Signed)
[facility] Group Notes: (Clinical Social Work)   02/09/2014      Type of Therapy:  Group Therapy   Participation Level:  Did Not Attend    Selmer Dominion, LCSW 02/09/2014, 4:29 PM

## 2014-02-10 MED ORDER — TRAZODONE HCL 100 MG PO TABS
100.0000 mg | ORAL_TABLET | Freq: Every day | ORAL | Status: DC
  Filled 2014-02-10: qty 1
  Filled 2014-02-10: qty 3
  Filled 2014-02-10 (×4): qty 1

## 2014-02-10 MED ORDER — ARIPIPRAZOLE 10 MG PO TABS
10.0000 mg | ORAL_TABLET | Freq: Every day | ORAL | Status: DC
  Administered 2014-02-11: 10 mg via ORAL
  Filled 2014-02-10 (×2): qty 1

## 2014-02-10 NOTE — Progress Notes (Signed)
Patient ID: Justin Chaney, male DOB: [birth year], 18 y.o. MRN: [id number]  Psychoeducational Group Note  Date: 02/10/2014   Time: 0945   Group Topic/Focus: Healthy Coping Skills  Participation Level: Did Not Attend

## 2014-02-10 NOTE — Progress Notes (Signed)
I agree with the assessment and plan.

## 2014-02-10 NOTE — Progress Notes (Signed)
The focus of this group is to help patients review their daily goal of treatment and discuss progress on daily workbooks.  Pt did not attend the evening group session. 

## 2014-02-10 NOTE — Progress Notes (Signed)
Patient ID: Justin Chaney, male   DOB: 1996/06/10, 18 y.o.   MRN: [id number] [facility] MD Progress Note  02/10/2014 2:02 PM Justin Chaney  MRN:  [id number] Subjective:   Patient states "I don't want to attend groups. The medication is making me sleepy. The thoughts never change! I told you all that. I don't know why you keep asking about it. It's just a constant thing."   Objective:  Patient is seen and chart is reviewed. He is assessed in his room in the morning. Patient is noted to be sleeping in a darkened room. The patient does not make any eye contact with Probation officer. He becomes easily agitated by the assessment questions, insisting that his symptoms do not improve. Patient informed nursing staff that his symptoms of anger were getting better. The patient later requested to speak to this writer again. He apologized for being so irritable during prior assessment. Patient has been compliant with his scheduled medications. He has been receiving prn doses of ativan for agitation. Patient has not been attending the scheduled groups and is not participating on the unit.   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  Asthma    .  Seizures    .  Brain ventricular shunt obstruction      hydrochelpis   .  Depression    .  Bipolar disorder     AXIS IV: educational problems, housing problems, occupational problems and other psychosocial or environmental problems  AXIS V: 41-50 Serious Symptoms  ADL's:  Intact  Sleep: Fair  Appetite:  Fair  Suicidal Ideation:  Denies Homicidal Ideation:  Continues to have strong thoughts of hurting others  AEB (as evidenced by):  Psychiatric Specialty Exam: Physical Exam  Review of Systems  Constitutional: Negative.   HENT: Negative.   Eyes: Negative.   Respiratory: Negative.   Cardiovascular:  Negative.   Gastrointestinal: Negative.   Genitourinary: Negative.   Musculoskeletal: Negative.   Skin: Negative.   Neurological: Negative.   Endo/Heme/Allergies: Negative.   Psychiatric/Behavioral: Positive for depression and hallucinations. The patient is nervous/anxious.     Blood pressure 125/43, pulse 111, temperature 98.9 F (37.2 C), temperature source Oral, resp. rate 20, height 5' 2.75" (1.594 m), weight 80.74 kg (178 lb).Body mass index is 31.78 kg/(m^2).  General Appearance: Casual  Eye Contact::  Fair  Speech:  Pressured  Volume:  Normal  Mood:  Angry and Irritable  Affect:  Labile  Thought Process:  Circumstantial and Disorganized  Orientation:  Full (Time, Place, and Person)  Thought Content:  Delusions, Hallucinations: Auditory and Obsessions  Suicidal Thoughts:  No  Homicidal Thoughts:  Yes.  without intent/plan  Memory:  Immediate;   Fair Recent;   Fair Remote;   Fair  Judgement:  Impaired  Insight:  Lacking  Psychomotor Activity:  Increased  Concentration:  Fair  Recall:  [employer] of Knowledge:Fair  Language: Fair  Akathisia:  No  Handed:  Right  AIMS (if indicated):     Assets:  Communication Skills Desire for Improvement Physical Health  Sleep:  Number of Hours: 5.5   Musculoskeletal: Strength & Muscle Tone: within normal limits Gait & Station: normal Patient leans: N/A  Current Medications: Current Facility-Administered Medications  Medication Dose Route Frequency Provider Last Rate Last Dose  . acetaminophen (TYLENOL) tablet 650 mg  650 mg Oral Q6H PRN Malena Peer, NP      . alum & mag hydroxide-simeth (MAALOX/MYLANTA) 200-200-20 MG/5ML suspension 30 mL  30 mL Oral Q4H PRN Evanna Glenda Chroman, NP      . ARIPiprazole (ABILIFY) tablet 5 mg  5 mg Oral Daily Elmarie Shiley, NP   5 mg at 02/10/14 0747  . carbamazepine (TEGRETOL XR) 12 hr tablet 200 mg  200 mg Oral BID Mojeed Akintayo   200 mg at 02/10/14 0747  . LORazepam (ATIVAN) tablet 1  mg  1 mg Oral Q6H PRN Malena Peer, NP   1 mg at 02/10/14 0748  . magnesium hydroxide (MILK OF MAGNESIA) suspension 30 mL  30 mL Oral Daily PRN Evanna Glenda Chroman, NP      . OLANZapine zydis (ZYPREXA) disintegrating tablet 10 mg  10 mg Oral Q8H PRN Mojeed Akintayo      . traZODone (DESYREL) tablet 50 mg  50 mg Oral QHS PRN,MR X 1 Evanna Glenda Chroman, NP        Lab Results: No results found for this or any previous visit (from the past 48 hour(s)).  Physical Findings: AIMS:  , ,  ,  ,    CIWA:  CIWA-Ar Total: 1 COWS:     Treatment Plan Summary: Daily contact with patient to assess and evaluate symptoms and progress in treatment Medication management  Plan: 1. Continue crisis management and stabilization.  2. Medication management:  -Increase Abilify to 10 mg daily for psychosis/mood control -Continue Tegretol XR 200 mg BID for improved mood stability -Continue Zyprexa Zydis 10 mg every eight hours prn psychosis -Change Trazodone to 100 mg hs to improve sleep.  3. Encouraged patient to attend groups and participate in group counseling sessions and activities.  4. Discharge plan in progress.  5. Continue current treatment plan.  6. Address health issues: Vitals reviewed and stable.   Medical Decision Making Problem Points:  Established problem, stable/improving (1), Review of last therapy session (1) and Review of psycho-social stressors (1) Data Points:  Review or order clinical lab tests (1) Review of medication regiment & side effects (2) Review of new medications or change in dosage (2)  I certify that inpatient services furnished can reasonably be expected to improve the patient's condition.   Justin Piatkowski NP-C 02/10/2014, 2:02 PM

## 2014-02-10 NOTE — Progress Notes (Signed)
Patient found resting in bed in the dark. Presentation remains unchanged. He continues to be irritable with questions and tolerates the unit stimulation poorly. He does complain of sleepiness related to the meds and says, "they are making me angry." Spoke with patient about the need for med compliance and also encouraged him to speak with MD about side effect concerns tomorrow. He has refused groups today and was only up for snack tonight. Continues to endorse AH with thoughts to harm self and others. No VH. Contracts for safety while on unit. Will continue to monitor closely. No physical complaints at this time. Jamie Kato

## 2014-02-10 NOTE — Plan of Care (Signed)
Problem: Ineffective individual coping Goal: STG: Patient will remain free from self harm Outcome: Progressing While patient has endorsed passive SI, he has refrained from acts of self harm.  Problem: Risk for injury r/t impulsive behavior (as evidenced by) Goal: STG-Pt is able to attend groups for at least 15 minutes Outcome: Not Progressing Patient has refused all groups today and remains in bed.

## 2014-02-10 NOTE — Progress Notes (Signed)
  Psychoeducational Group Note  Date: 02/10/2014   Time:0910am   Group Topic/Focus: Pt self inventory  Participation Level: Did Not Attend

## 2014-02-10 NOTE — Progress Notes (Signed)
Adult Psychoeducational Group Note  Date:  02/10/2014 Time:  9:25 PM  Group Topic/Focus:  Wrap-Up Group:   The focus of this group is to help patients review their daily goal of treatment and discuss progress on daily workbooks.  Participation Level:  Did Not Attend  Participation Quality:  Pt did not attend group  Affect:  none  Cognitive:  none  Insight: None  Engagement in Group:  None  Modes of Intervention:  Pt did not attend  Additional Comments:  Pt did not attend  Estell Harpin K 02/10/2014, 9:25 PM

## 2014-02-10 NOTE — [facility] Group Notes (Signed)
[facility] Group Notes: (Clinical Social Work)   02/10/2014      Type of Therapy:  Group Therapy   Participation Level:  Did Not Attend    Selmer Dominion, LCSW 02/10/2014, 12:34 PM

## 2014-02-10 NOTE — Progress Notes (Signed)
Patient ID: Justin Chaney, male   DOB: 24-Dec-1995, 18 y.o.   MRN: [id number] D. Patient presents with irritable mood, affect labile again today. Justin Chaney continues to be easily agitated, and unable to tolerate much stimulation on the unit as well. He continues to be guarded, and not forthcoming of thoughts. He did report this morning '' yeah my anger is better ''   He was cooperative with Probation officer and with medications this morning. A. Medications given as ordered.(including prn medication for agitation) Support and encouragement provided. Discussed above information with L. Davis NP. R. Patient is in no acute distress at this time. No further voiced concerns at this time. Will continue to monitor q 15 minutes for safety.

## 2014-02-11 DIAGNOSIS — F319 Bipolar disorder, unspecified: Secondary | ICD-10-CM | POA: Diagnosis present

## 2014-02-11 MED ORDER — ARIPIPRAZOLE 15 MG PO TABS
15.0000 mg | ORAL_TABLET | Freq: Every day | ORAL | Status: DC
  Administered 2014-02-12 – 2014-02-13 (×2): 15 mg via ORAL
  Filled 2014-02-11 (×2): qty 1
  Filled 2014-02-11: qty 3
  Filled 2014-02-11: qty 1

## 2014-02-11 NOTE — BHH Group Notes (Addendum)
[place] LCSW Group Therapy  02/11/2014 1:15 pm  Type of Therapy: Process Group Therapy  Participation Level:  Active  Participation Quality:  Appropriate  Affect:  Flat  Cognitive:  Oriented  Insight:  Improving  Engagement in Group:  Limited  Engagement in Therapy:  Limited  Modes of Intervention:  Activity, Clarification, Education, Problem-solving and Support  Summary of Progress/Problems: Today's group addressed the issue of overcoming obstacles.  Patients were asked to identify their biggest obstacle post d/c that stands in the way of their on-going success, and then problem solve as to how to manage this.  Justin Chaney initially stated his biggest obstacle was communication-"you know, talking over other people-not listening."  Went on to say he likes doing things his way, especially if someone is trying to tell him how to do something and they are acting "Like they know everything."  He was slurring his words and acting sedated, and that was the extent of his input initially.  He became more alert as group was going on, and gave his approval when he heard a statement that he agreed with.   Towards the end of group he added that he believes "it is time to bury the hatchet with my family."  [name] 02/11/2014   4:33 PM

## 2014-02-11 NOTE — Progress Notes (Signed)
Patient ID: Justin Chaney, male   DOB: 1995/12/22, 18 y.o.   MRN: [id number] [facility] MD Progress Note  02/11/2014 11:01 AM Justin Chaney  MRN:  [id number] Subjective:   Patient states "I keep on having the thoughts of eating people in my head, I just don't know how to get rid of it.''  Objective:  Patient is seen and chart is reviewed. Patient reports recurrent intrusive thoughts of "eating people up" which he has been having since childhood, he is convinced that nobody will be able to get the thoughts out of his head. He has been isolating himself, he is demanding, argumentative, defiant and oppositional. Patient reports racing thoughts and mood lability. So far, he has been partially compliant with his medications but has not reported any adverse reactions. Patient has not been attending the scheduled groups and is not participating on the unit.   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  Asthma    .  Seizures    .  Brain ventricular shunt obstruction      hydrochelpis   .  Depression    .  Bipolar disorder     AXIS IV: educational problems, housing problems, occupational problems and other psychosocial or environmental problems  AXIS V: 41-50 Serious Symptoms  ADL's:  Intact  Sleep: Fair  Appetite:  Fair  Suicidal Ideation:  Denies Homicidal Ideation:  Continues to have strong thoughts of hurting others  AEB (as evidenced by):  Psychiatric Specialty Exam: Physical Exam  Psychiatric: His affect is angry and labile. His speech is rapid and/or pressured. He is agitated and aggressive. Thought content is paranoid. Cognition and memory are normal. He expresses impulsivity.    Review of Systems  Constitutional: Negative.   HENT: Negative.   Eyes: Negative.   Respiratory: Negative.   Cardiovascular: Negative.    Gastrointestinal: Negative.   Genitourinary: Negative.   Musculoskeletal: Negative.   Skin: Negative.   Neurological: Negative.   Endo/Heme/Allergies: Negative.   Psychiatric/Behavioral: Positive for hallucinations. The patient is nervous/anxious.     Blood pressure 128/70, pulse 99, temperature 98 F (36.7 C), temperature source Oral, resp. rate 16, height 5' 2.75" (1.594 m), weight 80.74 kg (178 lb).Body mass index is 31.78 kg/(m^2).  General Appearance: Casual  Eye Contact::  Fair  Speech:  Pressured  Volume:  Normal  Mood:  Angry and Irritable  Affect:  Labile  Thought Process:  Circumstantial and Disorganized  Orientation:  Full (Time, Place, and Person)  Thought Content:  Delusions, Hallucinations: Auditory and Obsessions  Suicidal Thoughts:  No  Homicidal Thoughts:  Yes.  without intent/plan  Memory:  Immediate;   Fair Recent;   Fair Remote;   Fair  Judgement:  Impaired  Insight:  Lacking  Psychomotor Activity:  Increased  Concentration:  Fair  Recall:  [employer] of Knowledge:Fair  Language: Fair  Akathisia:  No  Handed:  Right  AIMS (if indicated):     Assets:  Communication Skills Desire for Improvement Physical Health  Sleep:  Number of Hours: 4.75   Musculoskeletal: Strength & Muscle Tone: within normal limits Gait & Station: normal Patient leans: N/A  Current Medications: Current Facility-Administered Medications  Medication Dose Route Frequency Provider Last Rate Last Dose  . acetaminophen (TYLENOL) tablet 650 mg  650 mg Oral Q6H PRN Garth Schlatter  Burkett, NP      . alum & mag hydroxide-simeth (MAALOX/MYLANTA) 200-200-20 MG/5ML suspension 30 mL  30 mL Oral Q4H PRN Evanna Glenda Chroman, NP      . Derrill Memo ON 02/12/2014] ARIPiprazole (ABILIFY) tablet 15 mg  15 mg Oral QHS Shantara Goosby      . carbamazepine (TEGRETOL XR) 12 hr tablet 200 mg  200 mg Oral BID Elexia Friedt   200 mg at 02/11/14 0829  . LORazepam (ATIVAN) tablet 1 mg  1 mg Oral Q6H PRN Malena Peer, NP   1 mg at 02/10/14 0748  . magnesium hydroxide (MILK OF MAGNESIA) suspension 30 mL  30 mL Oral Daily PRN Evanna Glenda Chroman, NP      . OLANZapine zydis (ZYPREXA) disintegrating tablet 10 mg  10 mg Oral Q8H PRN Syniah Berne      . traZODone (DESYREL) tablet 100 mg  100 mg Oral QHS Elmarie Shiley, NP        Lab Results: No results found for this or any previous visit (from the past 48 hour(s)).  Physical Findings: AIMS:  , ,  ,  ,    CIWA:  CIWA-Ar Total: 1 COWS:     Treatment Plan Summary: Daily contact with patient to assess and evaluate symptoms and progress in treatment Medication management  Plan: 1. Continue crisis management and stabilization.  2. Medication management:  -Increase Abilify to 15 mg daily for psychosis/mood control -Continue Tegretol XR 200 mg BID for improved mood stability -Continue Zyprexa Zydis 10 mg every eight hours prn psychosis -Change Trazodone to 100 mg hs to improve sleep.  3. Encouraged patient to attend groups and participate in group counseling sessions and activities.  4. Discharge plan in progress.  5. Continue current treatment plan.  6. Address health issues: Vitals reviewed and stable.   Medical Decision Making Problem Points:  Established problem, stable/improving (1), Review of last therapy session (1) and Review of psycho-social stressors (1) Data Points:  Review or order clinical lab tests (1) Review of medication regiment & side effects (2) Review of new medications or change in dosage (2)  I certify that inpatient services furnished can reasonably be expected to improve the patient's condition.   Corena Pilgrim, MD 02/11/2014, 11:01 AM

## 2014-02-11 NOTE — BHH Group Notes (Signed)
[facility] LCSW Aftercare Discharge Planning Group Note   02/11/2014 10:47 AM  Participation Quality:  Did not attend    [place]

## 2014-02-11 NOTE — Progress Notes (Signed)
D-pt was irritated this am, pt sts he wants to eat people and that he will do that one day A-pt did not attend group, but he did take his am medications R-cont to monitor for safety

## 2014-02-12 NOTE — BHH Group Notes (Signed)
[place] LCSW Group Therapy  02/12/2014 , 1:01 PM   Type of Therapy:  Group Therapy  Participation Level:  Did not attend  Summary of Progress/Problems: Today's group focused on the term Diagnosis.  Participants were asked to define the term, and then pronounce whether it is a negative, positive or neutral term.  Roque Lias B 02/12/2014 , 1:01 PM

## 2014-02-12 NOTE — Progress Notes (Signed)
D:Pt reports that he has constant thoughts of cannibalism. Pt's mood is labile and irritable. Pt is guarded with interaction and observed by writer demanding that NP "back up" when she entered the room to assess the pt.  D:Offered support and 15 minute checks. Gave prn medication for agitation and scheduled medication as ordered. R:Safety maintained on the unit.

## 2014-02-12 NOTE — BHH Group Notes (Signed)
[place] Group Notes:  (Nursing/MHT/Case Management/Adjunct)  Date:  02/12/2014  Time:  9:51 AM  Type of Therapy:  GOALS/ORIENTATION  Participation Level:  Did Not Attend  Participation Quality:    Affect:    Cognitive:    Insight:    Engagement in Group:    Modes of Intervention:    Summary of Progress/Problems:  Justin Chaney 02/12/2014, 9:51 AM

## 2014-02-12 NOTE — Tx Team (Signed)
  Interdisciplinary Treatment Plan Update   Date Reviewed:  02/12/2014  Time Reviewed:  8:19 AM  Progress in Treatment:   Attending groups: Yes Participating in groups: Yes Taking medication as prescribed: Yes  Tolerating medication: Yes Family/Significant other contact made: Yes  Patient understands diagnosis: Yes  Discussing patient identified problems/goals with staff: Yes Medical problems stabilized or resolved: Yes Denies suicidal/homicidal ideation: Yes Patient has not harmed self or others: Yes  For review of initial/current patient goals, please see plan of care.  Estimated Length of Stay:  D/C today  Reason for Continuation of Hospitalization:   New Problems/Goals identified:  N/A  Discharge Plan or Barriers:   return home, follow up outpt  Additional Comments:  Attendees:  Signature: Corena Pilgrim, MD 02/12/2014 8:19 AM   Signature: Ripley Fraise, LCSW 02/12/2014 8:19 AM  Signature: Elmarie Shiley, NP 02/12/2014 8:19 AM  Signature: Mayra Neer, RN 02/12/2014 8:19 AM  Signature: Darrol Angel, RN 02/12/2014 8:19 AM  Signature:  02/12/2014 8:19 AM  Signature:   02/12/2014 8:19 AM  Signature:    Signature:    Signature:    Signature:    Signature:    Signature:      Scribe for Treatment Team:   Ripley Fraise, LCSW  02/12/2014 8:19 AM

## 2014-02-12 NOTE — Progress Notes (Signed)
Patient ID: Justin Chaney, male   DOB: 1996-07-04, 18 y.o.   MRN: [id number] [facility] MD Progress Note  02/12/2014 2:01 PM Bardia Wangerin  MRN:  [id number] Subjective:   Patient states "I need you to back up. I told you the violent thoughts are still there. I'm tired of these questions. I'm sleepy from my medication."   Objective:  Patient is seen and chart is reviewed. Patient reports recurrent intrusive thoughts of "eating people up" which he has been having since childhood, he is convinced that nobody will be able to get the thoughts out of his head. He has been isolating himself, he is demanding, argumentative, defiant and oppositional. He is assessed in his room today. The patient is sleepy from receiving a prn dose of Zyprexa this morning. However, he quickly becomes irritable when he is asked if his violent thoughts have improved. His speech was very soft and patient would not make any eye contact. Instead he rolled to the other side of the bed to avoid Probation officer. Patient informed that he was receiving psychiatric treatment and that staff needed to assess for progress. Nursing staff also report that when asked to complete his self inventory this morning the patient instead rolled it up and threw in the trash can.   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  Asthma    .  Seizures    .  Brain ventricular shunt obstruction      hydrochelpis   .  Depression    .  Bipolar disorder     AXIS IV: educational problems, housing problems, occupational problems and other psychosocial or environmental problems  AXIS V: 41-50 Serious Symptoms  ADL's:  Intact  Sleep: Fair  Appetite:  Fair  Suicidal Ideation:  Denies Homicidal Ideation:  Continues to have strong thoughts of hurting others  AEB (as evidenced by):  Psychiatric Specialty  Exam: Physical Exam  Psychiatric: His affect is angry and labile. His speech is rapid and/or pressured. He is agitated and aggressive. Thought content is paranoid. Cognition and memory are normal. He expresses impulsivity.    Review of Systems  Constitutional: Negative.   HENT: Negative.   Eyes: Negative.   Respiratory: Negative.   Cardiovascular: Negative.   Gastrointestinal: Negative.   Genitourinary: Negative.   Musculoskeletal: Negative.   Skin: Negative.   Neurological: Negative.   Endo/Heme/Allergies: Negative.   Psychiatric/Behavioral: Positive for hallucinations. The patient is nervous/anxious.     Blood pressure 136/74, pulse 100, temperature 97.6 F (36.4 C), temperature source Oral, resp. rate 20, height 5' 2.75" (1.594 m), weight 80.74 kg (178 lb).Body mass index is 31.78 kg/(m^2).  General Appearance: Casual  Eye Contact::  Fair  Speech:  Pressured  Volume:  Normal  Mood:  Angry and Irritable  Affect:  Labile  Thought Process:  Circumstantial and Disorganized  Orientation:  Full (Time, Place, and Person)  Thought Content:  Delusions, Hallucinations: Auditory and Obsessions  Suicidal Thoughts:  No  Homicidal Thoughts:  Yes.  without intent/plan  Memory:  Immediate;   Fair Recent;   Fair Remote;   Fair  Judgement:  Impaired  Insight:  Lacking  Psychomotor Activity:  Increased  Concentration:  Fair  Recall:  [place]  Language: Fair  Akathisia:  No  Handed:  Right  AIMS (if indicated):  Assets:  Communication Skills Desire for Improvement Physical Health  Sleep:  Number of Hours: 6.25   Musculoskeletal: Strength & Muscle Tone: within normal limits Gait & Station: normal Patient leans: N/A  Current Medications: Current Facility-Administered Medications  Medication Dose Route Frequency Provider Last Rate Last Dose  . acetaminophen (TYLENOL) tablet 650 mg  650 mg Oral Q6H PRN Evanna Glenda Chroman, NP      . alum & mag  hydroxide-simeth (MAALOX/MYLANTA) 200-200-20 MG/5ML suspension 30 mL  30 mL Oral Q4H PRN Evanna Glenda Chroman, NP      . ARIPiprazole (ABILIFY) tablet 15 mg  15 mg Oral QHS Januel Doolan      . carbamazepine (TEGRETOL XR) 12 hr tablet 200 mg  200 mg Oral BID Jaiya Mooradian   200 mg at 02/12/14 0804  . LORazepam (ATIVAN) tablet 1 mg  1 mg Oral Q6H PRN Malena Peer, NP   1 mg at 02/10/14 0748  . magnesium hydroxide (MILK OF MAGNESIA) suspension 30 mL  30 mL Oral Daily PRN Evanna Glenda Chroman, NP      . OLANZapine zydis (ZYPREXA) disintegrating tablet 10 mg  10 mg Oral Q8H PRN Christasia Angeletti   10 mg at 02/12/14 0806  . traZODone (DESYREL) tablet 100 mg  100 mg Oral QHS Elmarie Shiley, NP        Lab Results: No results found for this or any previous visit (from the past 48 hour(s)).  Physical Findings: AIMS:  , ,  ,  ,    CIWA:  CIWA-Ar Total: 1 COWS:     Treatment Plan Summary: Daily contact with patient to assess and evaluate symptoms and progress in treatment Medication management  Plan: 1. Continue crisis management and stabilization.  2. Medication management:  -Continue Abilify to 15 mg daily for psychosis/mood control -Continue Tegretol XR 200 mg BID for improved mood stability -Continue Zyprexa Zydis 10 mg every eight hours prn psychosis -Continue Trazodone to 100 mg hs to improve sleep.  3. Encouraged patient to attend groups and participate in group counseling sessions and activities.  4. Discharge plan in progress. Anticipate discharge on Thursday with Tegretol level to be drawn in am.  5. Continue current treatment plan.  6. Address health issues: Vitals reviewed and stable.   Medical Decision Making Problem Points:  Established problem, stable/improving (1), Review of last therapy session (1) and Review of psycho-social stressors (1) Data Points:  Review or order clinical lab tests (1) Review of medication regiment & side effects (2) Review of new medications or  change in dosage (2)  I certify that inpatient services furnished can reasonably be expected to improve the patient's condition.   Elmarie Shiley, NP-C 02/12/2014, 2:01 PM  Patient seen, evaluated and I agree with notes by Nurse Practitioner. Corena Pilgrim, MD

## 2014-02-12 NOTE — BHH Group Notes (Signed)
Adult Psychoeducational Group Note  Date:  02/12/2014 Time:  9:09 PM  Group Topic/Focus:  Wrap-Up Group:   The focus of this group is to help patients review their daily goal of treatment and discuss progress on daily workbooks.  Participation Level:  Did Not Attend  Participation Quality:  None  Affect:  None  Cognitive:  None  Insight: None  Engagement in Group:  None  Modes of Intervention:  Discussion  Additional Comments:  Rylyn did not attend group.  Victorino Sparrow A 02/12/2014, 9:09 PM

## 2014-02-12 NOTE — Progress Notes (Signed)
Pt reports he is doing ok this evening.  He has been pacing the hallway at times.  He frequently asks for things like gatorade and socks, but he has been appropriate and cooperative with staff this evening.  Pt denies SI/HI/AV at this time.  He started to go to group tonight, but when he saw the room almost full, he chose not to attend and went back to his room.  Writer discussed his hs med with him, but he stated he did not want a sleeping pill.  Pt was encouraged to make his needs known to staff.  Pt voiced understanding.  Support and encouragement offered.  Safety maintained with q15 minute checks.

## 2014-02-12 NOTE — Progress Notes (Signed)
Per report from MHT, when pt was given self inventory sheet and asked to complete, he rolled it up and threw it in the trash can.

## 2014-02-13 DIAGNOSIS — F319 Bipolar disorder, unspecified: Principal | ICD-10-CM

## 2014-02-13 DIAGNOSIS — F1210 Cannabis abuse, uncomplicated: Secondary | ICD-10-CM

## 2014-02-13 NOTE — Progress Notes (Signed)
The focus of this group is to help patients review their daily goal of treatment and discuss progress on daily workbooks. Pt did not attend the evening group. 

## 2014-02-13 NOTE — Tx Team (Signed)
  Interdisciplinary Treatment Plan Update   Date Reviewed:  02/13/2014  Time Reviewed:  8:23 AM  Progress in Treatment:   Attending groups: Yes Participating in groups: Yes Taking medication as prescribed: Yes  Tolerating medication: Yes Family/Significant other contact made: Yes  Patient understands diagnosis: Yes  Discussing patient identified problems/goals with staff: Yes Medical problems stabilized or resolved: Yes Denies suicidal/homicidal ideation: Yes Patient has not harmed self or others: Yes  For review of initial/current patient goals, please see plan of care.  Estimated Length of Stay:  D/C tomorrow, Friday at the latest  Reason for Continuation of Hospitalization: Medication stabilization  New Problems/Goals identified:  N/A  Discharge Plan or Barriers:   pt is unsure where he will go  States he cannot go to shelter, but does not want to leave [place]  Will follow up at [facility]  Additional Comments:   Patient states "my thoughts of eating people will never go away but my medications seems to be working at last.''  Patient reports ongoing intrusive thoughts of "eating people up" which he claimed will never go away. Patient refused his medications this morning because he claimed to be sedated. He has not been interacting with peers, not participating in group,   Attendees:  Signature: Corena Pilgrim, MD 02/13/2014 8:23 AM   Signature: Ripley Fraise, LCSW 02/13/2014 8:23 AM  Signature: Elmarie Shiley, NP 02/13/2014 8:23 AM  Signature: Mayra Neer, RN 02/13/2014 8:23 AM  Signature: Darrol Angel, RN 02/13/2014 8:23 AM  Signature:  02/13/2014 8:23 AM  Signature:   02/13/2014 8:23 AM  Signature:    Signature:    Signature:    Signature:    Signature:    Signature:      Scribe for Treatment Team:   Ripley Fraise, LCSW  02/13/2014 8:23 AM

## 2014-02-13 NOTE — Progress Notes (Signed)
Patient ID: Justin Chaney, male   DOB: 02/22/1996, 18 y.o.   MRN: [id number] D: Patient has been in bed this morning.  Patient states he is on too much medication and he making him drowsy.  He reports HI tendencies with thoughts of eating people.  He was pleasant this morning, however, can be labile at times.  He refused to fill out his self inventory sheet.  Patient met with treatment team to discuss discharge.  Patient states he will not go home to live with his mother.  Discharge may be tomorrow or Friday.  Patient is unsure where he will go.  He denies any SI/AVH.  A: Continue to monitor medication management and MD orders.  Safety checks completed every 15 minutes per protocol.  R: patient is isolative to room.

## 2014-02-13 NOTE — BHH Group Notes (Signed)
[facility] LCSW Aftercare Discharge Planning Group Note   02/13/2014 1:38 PM  Participation Quality: Did not attend  Justin Chaney

## 2014-02-13 NOTE — Progress Notes (Addendum)
Patient ID: Justin Chaney, male   DOB: 1995-12-20, 20 y.o.   MRN: [id number] [facility] MD Progress Note  02/13/2014 10:27 AM Cristo Ausburn  MRN:  [id number] Subjective:   Patient states "my thoughts of eating people will never go away but my medications seems to be working at last.''  Objective:  Patient is seen and chart is reviewed. Patient reports ongoing  intrusive thoughts of "eating people up" which he claimed will never go away. Patient refused his medications this morning because he claimed to be sedated.  He has not been interacting with peers, not participating in group,  isolating himself, remains argumentative, defiant and oppositional. Patient reports decreased racing thoughts and mood lability. So far, he has not  reported any adverse reactions.  Diagnosis:   DSM5: Total Time spent with patient: 20 minutes AXIS I: Bipolar disorder-unspecified             Psychotic Disorder              Cannabis use disorder AXIS II: Deferred  AXIS III:  Past Medical History   Diagnosis  Date   .  Asthma    .  Seizures    .  Brain ventricular shunt obstruction      hydrochelpis    AXIS IV: educational problems, housing problems, occupational problems and other psychosocial or environmental problems  AXIS V: 50-60 moderate Symptoms  ADL's:  Intact  Sleep: Fair  Appetite:  Fair  Suicidal Ideation:  Denies Homicidal Ideation:  Continues to have strong thoughts of eating  Other people AEB (as evidenced by):  Psychiatric Specialty Exam: Physical Exam  Psychiatric: His speech is normal. His affect is angry. He is agitated. Thought content is paranoid. Cognition and memory are normal. He expresses impulsivity.    Review of Systems  Constitutional: Negative.   HENT: Negative.   Eyes: Negative.   Respiratory: Negative.   Cardiovascular: Negative.   Gastrointestinal: Negative.   Genitourinary: Negative.   Musculoskeletal: Negative.   Skin: Negative.   Neurological: Negative.    Endo/Heme/Allergies: Negative.   Psychiatric/Behavioral: The patient is nervous/anxious.     Blood pressure 118/52, pulse 115, temperature 98.7 F (37.1 C), temperature source Oral, resp. rate 19, height 5' 2.75" (1.594 m), weight 80.74 kg (178 lb).Body mass index is 31.78 kg/(m^2).  General Appearance: Casual  Eye Contact::  Fair  Speech:  Pressured  Volume:  Normal  Mood:  Angry and Irritable  Affect:  Labile  Thought Process:  Circumstantial and Disorganized  Orientation:  Full (Time, Place, and Person)  Thought Content:  Delusions, Hallucinations: Auditory and Obsessions  Suicidal Thoughts:  No  Homicidal Thoughts:  Yes.  without intent/plan  Memory:  Immediate;   Fair Recent;   Fair Remote;   Fair  Judgement:  Impaired  Insight:  Lacking  Psychomotor Activity:  Increased  Concentration:  Fair  Recall:  [employer] of Knowledge:Fair  Language: Fair  Akathisia:  No  Handed:  Right  AIMS (if indicated):     Assets:  Communication Skills Desire for Improvement Physical Health  Sleep:  Number of Hours: 6.75   Musculoskeletal: Strength & Muscle Tone: within normal limits Gait & Station: normal Patient leans: N/A  Current Medications: Current Facility-Administered Medications  Medication Dose Route Frequency Provider Last Rate Last Dose  . acetaminophen (TYLENOL) tablet 650 mg  650 mg Oral Q6H PRN Malena Peer, NP      . alum & mag hydroxide-simeth (MAALOX/MYLANTA) 200-200-20 MG/5ML suspension 30 mL  30 mL Oral Q4H PRN Malena Peer, NP      . ARIPiprazole (ABILIFY) tablet 15 mg  15 mg Oral QHS Caden Fukushima   15 mg at 02/12/14 2221  . carbamazepine (TEGRETOL XR) 12 hr tablet 200 mg  200 mg Oral BID Ashling Roane   200 mg at 02/12/14 1729  . LORazepam (ATIVAN) tablet 1 mg  1 mg Oral Q6H PRN Malena Peer, NP   1 mg at 02/10/14 0748  . magnesium hydroxide (MILK OF MAGNESIA) suspension 30 mL  30 mL Oral Daily PRN Evanna Glenda Chroman, NP      .  OLANZapine zydis (ZYPREXA) disintegrating tablet 10 mg  10 mg Oral Q8H PRN Tyjay Galindo   10 mg at 02/12/14 0806  . traZODone (DESYREL) tablet 100 mg  100 mg Oral QHS Elmarie Shiley, NP        Lab Results: No results found for this or any previous visit (from the past 48 hour(s)).  Physical Findings: AIMS:  , ,  ,  ,    CIWA:  CIWA-Ar Total: 1 COWS:     Treatment Plan Summary: Daily contact with patient to assess and evaluate symptoms and progress in treatment Medication management  Plan: 1. Continue crisis management and stabilization.  2. Medication management:  -Decrease Abilify 10 mg daily for psychosis/mood control. -Continue Tegretol XR 200 mg BID for improved mood stability -Continue Zyprexa Zydis 10 mg every eight hours prn psychosis -Change Trazodone to 100 mg hs to improve sleep.  3. Encouraged patient to attend groups and participate in group counseling sessions and activities.  4. Discharge plan in progress.  5. Continue current treatment plan.  6. Tegretol level on 02/14/14.  Medical Decision Making Problem Points:  Established problem, stable/improving (1), Review of last therapy session (1) and Review of psycho-social stressors (1) Data Points:  Review or order clinical lab tests (1) Review of medication regiment & side effects (2) Review of new medications or change in dosage (2)  I certify that inpatient services furnished can reasonably be expected to improve the patient's condition.   Corena Pilgrim, MD 02/13/2014, 10:27 AM

## 2014-02-13 NOTE — Progress Notes (Signed)
D: Pt reports that he is doing good this evening. He verbalizes that he is ready to go home. He verbalizes that he is still having the constant thoughts of wanting to eat others. He reports that the thought of it makes his "blood boil". He is currently denying any SI/HI/AVH. Despite encouragement, pt did not attend group. Pt was pleasant and cooperative this evening. Pt took his Abilify as scheduled. A: Writer administered scheduled medications to pt. Continued support and availability as needed was extended to this pt. Staff continue to monitor pt with q84min checks.  R: No adverse drug reactions noted. Pt receptive to treatment. Pt remains safe at this time.

## 2014-02-13 NOTE — [facility] Group Notes (Signed)
[facility] Mental Health Association Group Therapy  02/13/2014 , 1:44 PM    Type of Therapy:  Mental Health Association Presentation  Participation Level:  Active  Participation Quality:  Attentive  Affect:  Blunted  Cognitive:  Oriented  Insight:  Limited  Engagement in Therapy:  Engaged  Modes of Intervention:  Discussion, Education and Socialization  Summary of Progress/Problems:  [name] from [employer] came to present his recovery story and play the guitar.  Justin Chaney was engaged throughout.  He asked a lot of questions about guitars and music, and made a lot of musical requests of the presenter.  His mood was good.  At one point he stated he enjoys group, and wished he been coming more often.  Justin Chaney B 02/13/2014 , 1:44 PM

## 2014-02-14 LAB — CARBAMAZEPINE LEVEL, TOTAL: Carbamazepine Lvl: 8.1 ug/mL (ref 4.0–12.0)

## 2014-02-14 MED ORDER — CARBAMAZEPINE ER 200 MG PO TB12: 200.0000 mg | ORAL_TABLET | Freq: Two times a day (BID) | ORAL | Status: DC

## 2014-02-14 MED ORDER — ARIPIPRAZOLE 15 MG PO TABS: 15.0000 mg | ORAL_TABLET | Freq: Every day | ORAL | Status: DC

## 2014-02-14 MED ORDER — TRAZODONE HCL 100 MG PO TABS: 100.0000 mg | ORAL_TABLET | Freq: Every day | ORAL | Status: DC

## 2014-02-14 NOTE — [facility] Suicide Risk Assessment (Signed)
[facility] INPATIENT:  Family/Significant Other Suicide Prevention Education  Suicide Prevention Education:  Patient Refusal for Family/Significant Other Suicide Prevention Education: The patient Justin Chaney has refused to provide written consent for family/significant other to be provided Family/Significant Other Suicide Prevention Education during admission and/or prior to discharge.  Physician notified.  Roque Lias B 02/14/2014, 9:34 AM

## 2014-02-14 NOTE — Progress Notes (Signed)
[facility] Adult Case Management Discharge Plan :  Will you be returning to the same living situation after discharge: No.  Says he is not sure where he is going.  "maybe my friend will let me stay there.  [place]."  Referrals to shelters offered.  Pt declined. At discharge, do you have transportation home?:Yes,  bus pass Do you have the ability to pay for your medications:Yes,  insurance  Release of information consent forms completed and in the chart;  Patient's signature needed at discharge.  Patient to Follow up at: Follow-up Information   Follow up with [place] On 02/18/2014. (Monday at 5:00  Arrive at 4:30 to fill out paperwork.  Bring ID and insurance card)    Contact information:   [place] [address]      Patient denies SI/HI:   Yes,  yes    Land and Suicide Prevention discussed:  Yes,  yes  [name] 02/14/2014, 9:35 AM

## 2014-02-14 NOTE — [facility] Suicide Risk Assessment (Signed)
   Demographic Factors:  Male, Low socioeconomic status, Unemployed and African American  Total Time spent with patient: 20 minutes  Psychiatric Specialty Exam: Physical Exam  Psychiatric: He has a normal mood and affect. His speech is normal and behavior is normal. Judgment and thought content normal. Cognition and memory are normal.    Review of Systems  Constitutional: Negative.   HENT: Negative.   Eyes: Negative.   Respiratory: Negative.   Cardiovascular: Negative.   Gastrointestinal: Negative.   Genitourinary: Negative.   Musculoskeletal: Negative.   Skin: Negative.   Neurological: Negative.   Endo/Heme/Allergies: Negative.   Psychiatric/Behavioral: Negative.     Blood pressure 128/83, pulse 101, temperature 97.6 F (36.4 C), temperature source Oral, resp. rate 20, height 5' 2.75" (1.594 m), weight 80.74 kg (178 lb).Body mass index is 31.78 kg/(m^2).  General Appearance: Fairly Groomed  Engineer, water::  Negative  Speech:  Clear and Coherent and Normal Rate  Volume:  Normal  Mood:  Euthymic  Affect:  Appropriate  Thought Process:  Goal Directed  Orientation:  Full (Time, Place, and Person)  Thought Content:  Negative  Suicidal Thoughts:  No  Homicidal Thoughts:  No  Memory:  Immediate;   Fair Recent;   Fair Remote;   Fair  Judgement:  Fair  Insight:  Fair  Psychomotor Activity:  Negative  Concentration: fair  Recall: fair  Fund of Knowledge:Fair  Language: Fair  Akathisia:  No  Handed:  Right  AIMS (if indicated):     Assets:  Communication Skills Desire for Improvement Physical Health  Sleep:  Number of Hours: 6    Musculoskeletal: Strength & Muscle Tone: within normal limits Gait & Station: normal Patient leans: N/A   Mental Status Per Nursing Assessment::   On Admission:     Current Mental Status by Physician: patient denies suicidal ideation, ideation and plan  Loss Factors: Financial problems/change in socioeconomic status  Historical  Factors: poor impulsive control  Risk Reduction Factors:   NA  Continued Clinical Symptoms:  Alcohol/Substance Abuse/Dependencies  Cognitive Features That Contribute To Risk:  Closed-mindedness    Suicide Risk:  Minimal: No identifiable suicidal ideation.  Patients presenting with no risk factors but with morbid ruminations; may be classified as minimal risk based on the severity of the depressive symptoms  Discharge Diagnoses:   AXIS I:  Bipolar disorder, unspecified              Cannabis use disorder AXIS II:  Cluster B Traits AXIS III:   Past Medical History  Diagnosis Date  . Asthma   . Seizures   . Brain ventricular shunt obstruction     hydrochelpis  . Depression   . Bipolar disorder    AXIS IV:  housing problems, other psychosocial or environmental problems and problems related to social environment AXIS V:  61-70 mild symptoms  Plan Of Care/Follow-up recommendations:  Activity:  as tolerated Diet:  healthy Tests:  Tegretol level. Other:  patient to keep his after care appointment  Is patient on multiple antipsychotic therapies at discharge:  No   Has Patient had three or more failed trials of antipsychotic monotherapy by history:  No  Recommended Plan for Multiple Antipsychotic Therapies: NA    Corena Pilgrim, MD 02/14/2014, 9:24 AM

## 2014-02-14 NOTE — Progress Notes (Signed)
Pt discharged per MD orders; pt currently denies SI/HI and auditory/visual hallucinations; pt was given education by RN regarding follow-up appointments and medications and pt denied any questions or concerns about these instructions; pt was then escorted to search room to retrieve his belongings by RN before being discharged to hospital lobby. 

## 2014-02-14 NOTE — Discharge Summary (Signed)
Physician Discharge Summary Note  Patient:  Justin Chaney is an 18 y.o., male MRN:  [id number] DOB:  Dec 22, 1995 Patient phone:  [phone] (home)  Patient address:   [place] [address],  Total Time spent with patient: 20 minutes  Date of Admission:  02/07/2014 Date of Discharge: 02/14/14  Reason for Admission: Violent thoughts, Mood lability   Discharge Diagnoses: Principal Problem:   Bipolar disorder, unspecified Active Problems:   Psychotic disorder   Unspecified episodic mood disorder   Psychiatric Specialty Exam: Physical Exam  Review of Systems  Constitutional: Negative.   HENT: Negative.   Eyes: Negative.   Respiratory: Negative.   Cardiovascular: Negative.   Gastrointestinal: Negative.   Genitourinary: Negative.   Musculoskeletal: Negative.   Skin: Negative.   Neurological: Negative.   Endo/Heme/Allergies: Negative.   Psychiatric/Behavioral: Negative.     Blood pressure 128/83, pulse 101, temperature 97.6 F (36.4 C), temperature source Oral, resp. rate 20, height 5' 2.75" (1.594 m), weight 80.74 kg (178 lb).Body mass index is 31.78 kg/(m^2).  See Physician SRA                                                  Past Psychiatric History: See H&P Diagnosis:  Hospitalizations:  Outpatient Care:  Substance Abuse Care:  Self-Mutilation:  Suicidal Attempts:  Violent Behaviors:   Musculoskeletal: Strength & Muscle Tone: within normal limits Gait & Station: normal Patient leans: N/A  DSM5:  AXIS I: Bipolar disorder, unspecified  Cannabis use disorder  AXIS II: Cluster B Traits  AXIS III:  Past Medical History   Diagnosis  Date   .  Asthma    .  Seizures    .  Brain ventricular shunt obstruction      hydrochelpis   .  Depression    .  Bipolar disorder     AXIS IV: housing problems, other psychosocial or environmental problems and problems related to social environment  AXIS V: 61-70 mild  symptoms  Level of Care:  OP  Hospital Course:  Justin Chaney is a 18 year old male who presented to [facility] reporting intense homicidal thoughts. His family motivated him to seek help before he acted on the thoughts. Patient reports having thoughts to torture people and engage in cannibalism for a long time. The patient has been living with friends. His parents will not allow him to stay there because of not following any rules. Since being admitted the patient has been only partially compliant with assessments answering several questions today with "I prefer not to say." Patient states today during his psychiatric admission assessment "I think about eating people and torturing them. This has been going on my whole life. I have hurt people but I won't tell you the details. I feel paranoid like everyone is out to get me. I need help. I get angry. I can feel my blood boiling sometimes. I like the taste of blood. I have suicidal thoughts sometimes. I get upset very easily." The patient was initially placed on 1:1 observation due to concern that he would act on thoughts of hurting others. He was later changed to be a Do Not Admit due to these reasons. The patient appeared very tense and guarded during the assessment. Patient became irritable when a questions was asked that he did not want to answer. Lavalle admits to serious  anger problems. Nursing staff report that he initially refused to take his medications but later agreed.          Mandell Antrim was admitted to the adult 400 unit. He was evaluated and his symptoms were identified. Medication management was discussed and initiated. The patient was not taking any psychiatric medications prior to medications. Patient was started on Abilify and Tegretol XR for improved mood stability.  He was oriented to the unit and encouraged to participate in unit programming. Medical problems were identified and treated appropriately. Home medication was restarted as  needed.        The patient was evaluated each day by a clinical provider to ascertain the patient's response to treatment. Patient was irritable each day when routine assessment questions were asked. Patient insisted that his "thoughts to eat and torture people" could not be altered.  Improvement was noted by the patient's report of decreasing symptoms, improved sleep and appetite, affect, medication tolerance, behavior, and participation in unit programming.  He was asked each day to complete a self inventory noting mood, mental status, pain, new symptoms, anxiety and concerns.         He responded well to medication and being in a therapeutic and supportive environment. Positive and appropriate behavior was noted and the patient was motivated for recovery. Patient's mood could be very labile at times. Patient was resistant to attending groups until the day before discharge. He then reported that he wished he had come more often. The patient worked closely with the treatment team and case manager to develop a discharge plan with appropriate goals. Coping skills, problem solving as well as relaxation therapies were also part of the unit programming.         By the day of discharge he was in much improved condition than upon admission.  Symptoms were reported as significantly decreased or resolved completely.  The patient denied SI/HI and voiced no AVH. He was motivated to continue taking medication with a goal of continued improvement in mental health. Justin Chaney was discharged home with a plan to follow up as noted below. Patient was provided with prescriptions and sample medications.   Consults:  None  Significant Diagnostic Studies:  Chemistry, CBC, UDS  Discharge Vitals:   Blood pressure 128/83, pulse 101, temperature 97.6 F (36.4 C), temperature source Oral, resp. rate 20, height 5' 2.75" (1.594 m), weight 80.74 kg (178 lb). Body mass index is 31.78 kg/(m^2). Lab Results:   No results  found for this or any previous visit (from the past 72 hour(s)).  Physical Findings: AIMS:  , ,  ,  ,    CIWA:  CIWA-Ar Total: 1 COWS:     Psychiatric Specialty Exam: See Psychiatric Specialty Exam and Suicide Risk Assessment completed by Attending Physician prior to discharge.  Discharge destination:  Home  Is patient on multiple antipsychotic therapies at discharge:  No   Has Patient had three or more failed trials of antipsychotic monotherapy by history:  No  Recommended Plan for Multiple Antipsychotic Therapies: NA     Medication List       Indication   ARIPiprazole 15 MG tablet  Commonly known as:  ABILIFY  Take 1 tablet (15 mg total) by mouth at bedtime.   Indication:  Rapidly Alternating Manic-Depressive Psychosis     carbamazepine 200 MG 12 hr tablet  Commonly known as:  TEGRETOL XR  Take 1 tablet (200 mg total) by mouth 2 (two) times daily.   Indication:  Manic-Depression, labile mood     traZODone 100 MG tablet  Commonly known as:  DESYREL  Take 1 tablet (100 mg total) by mouth at bedtime.   Indication:  Trouble Sleeping           Follow-up Information   Follow up with [facility]/Dr Headen On 02/18/2014. (Monday at 5:00  Arrive at 4:30 to fill out paperwork.  Bring ID and insurance card)    Contact information:   [place] [phone]      Follow-up recommendations:   Activity: as tolerated  Diet: healthy  Tests: Tegretol level 8.1 on day of discharge Other: patient to keep his after care appointment  Comments:   Take all your medications as prescribed by your mental healthcare provider.  Report any adverse effects and or reactions from your medicines to your outpatient provider promptly.  Patient is instructed and cautioned to not engage in alcohol and or illegal drug use while on prescription medicines.  In the event of worsening symptoms, patient is instructed to call the crisis hotline, 911 and or go  to the nearest ED for appropriate evaluation and treatment of symptoms.  Follow-up with your primary care provider for your other medical issues, concerns and or health care needs.   Total Discharge Time:  Greater than 30 minutes.  SignedElmarie Shiley NP-C 02/14/2014, 9:26 AM  Patient seen, evaluated and I agree with notes by Nurse Practitioner. Corena Pilgrim, MD

## 2014-02-18 NOTE — Progress Notes (Signed)
Patient Discharge Instructions:  After Visit Summary (AVS):   Faxed to:  02/18/14 Discharge Summary Note:   Faxed to:  02/18/14 Psychiatric Admission Assessment Note:   Faxed to:  02/18/14 Suicide Risk Assessment - Discharge Assessment:   Faxed to:  02/18/14 Faxed/Sent to the Next Level Care provider:  02/18/14 Faxed to [facility] @ [place], 02/18/2014, 3:25 PM

## 2014-04-01 ENCOUNTER — Encounter (HOSPITAL_COMMUNITY): Payer: Self-pay | Admitting: General Practice

## 2014-04-01 ENCOUNTER — Inpatient Hospital Stay (HOSPITAL_COMMUNITY)
Admission: RE | Admit: 2014-04-01 | Discharge: 2014-04-10 | DRG: 885 | Disposition: A | Discharge status: Home / Self Care | Payer: BC Managed Care – PPO | Attending: Psychiatry | Admitting: Psychiatry

## 2014-04-01 DIAGNOSIS — F1210 Cannabis abuse, uncomplicated: Secondary | ICD-10-CM | POA: Diagnosis present

## 2014-04-01 DIAGNOSIS — Z9119 Patient's noncompliance with other medical treatment and regimen: Secondary | ICD-10-CM

## 2014-04-01 DIAGNOSIS — F39 Unspecified mood [affective] disorder: Secondary | ICD-10-CM | POA: Diagnosis present

## 2014-04-01 DIAGNOSIS — Z982 Presence of cerebrospinal fluid drainage device: Secondary | ICD-10-CM

## 2014-04-01 DIAGNOSIS — R45850 Homicidal ideations: Secondary | ICD-10-CM | POA: Diagnosis not present

## 2014-04-01 DIAGNOSIS — F250 Schizoaffective disorder, bipolar type: Secondary | ICD-10-CM

## 2014-04-01 DIAGNOSIS — Z91199 Patient's noncompliance with other medical treatment and regimen due to unspecified reason: Secondary | ICD-10-CM

## 2014-04-01 DIAGNOSIS — G4700 Insomnia, unspecified: Secondary | ICD-10-CM | POA: Diagnosis present

## 2014-04-01 DIAGNOSIS — F411 Generalized anxiety disorder: Secondary | ICD-10-CM | POA: Diagnosis present

## 2014-04-01 DIAGNOSIS — F6381 Intermittent explosive disorder: Secondary | ICD-10-CM | POA: Diagnosis present

## 2014-04-01 DIAGNOSIS — F17200 Nicotine dependence, unspecified, uncomplicated: Secondary | ICD-10-CM | POA: Diagnosis present

## 2014-04-01 DIAGNOSIS — F209 Schizophrenia, unspecified: Secondary | ICD-10-CM | POA: Diagnosis present

## 2014-04-01 DIAGNOSIS — F1510 Other stimulant abuse, uncomplicated: Secondary | ICD-10-CM

## 2014-04-01 DIAGNOSIS — F319 Bipolar disorder, unspecified: Secondary | ICD-10-CM | POA: Diagnosis present

## 2014-04-01 DIAGNOSIS — F429 Obsessive-compulsive disorder, unspecified: Secondary | ICD-10-CM | POA: Diagnosis present

## 2014-04-01 DIAGNOSIS — F29 Unspecified psychosis not due to a substance or known physiological condition: Secondary | ICD-10-CM | POA: Diagnosis present

## 2014-04-01 DIAGNOSIS — J45909 Unspecified asthma, uncomplicated: Secondary | ICD-10-CM | POA: Diagnosis present

## 2014-04-01 DIAGNOSIS — F259 Schizoaffective disorder, unspecified: Principal | ICD-10-CM | POA: Diagnosis present

## 2014-04-01 LAB — COMPREHENSIVE METABOLIC PANEL
ALT: 36 U/L (ref 0–53)
AST: 42 U/L — ABNORMAL HIGH (ref 0–37)
Albumin: 3.8 g/dL (ref 3.5–5.2)
Alkaline Phosphatase: 84 U/L (ref 39–117)
Anion gap: 12 (ref 5–15)
BUN: 12 mg/dL (ref 6–23)
CO2: 30 mEq/L (ref 19–32)
Calcium: 9.5 mg/dL (ref 8.4–10.5)
Chloride: 99 mEq/L (ref 96–112)
Creatinine, Ser: 1.16 mg/dL (ref 0.50–1.35)
GFR calc Af Amer: >90 mL/min (ref >90)
GFR calc non Af Amer: 90 mL/min — ABNORMAL LOW (ref >90)
Glucose, Bld: 92 mg/dL (ref 70–99)
Potassium: 3.8 mEq/L (ref 3.7–5.3)
Sodium: 141 mEq/L (ref 137–147)
Total Bilirubin: 0.7 mg/dL (ref 0.3–1.2)
Total Protein: 6.9 g/dL (ref 6.0–8.3)

## 2014-04-01 LAB — CBC WITH DIFFERENTIAL/PLATELET
Basophils Absolute: 0 10*3/uL (ref 0.0–0.1)
Basophils Relative: 0 % (ref 0–1)
Eosinophils Absolute: 0.2 10*3/uL (ref 0.0–0.7)
Eosinophils Relative: 3 % (ref 0–5)
HCT: 42.2 % (ref 39.0–52.0)
Hemoglobin: 14.6 g/dL (ref 13.0–17.0)
Lymphocytes Relative: 29 % (ref 12–46)
Lymphs Abs: 1.5 10*3/uL (ref 0.7–4.0)
MCH: 28.1 pg (ref 26.0–34.0)
MCHC: 34.6 g/dL (ref 30.0–36.0)
MCV: 81.3 fL (ref 78.0–100.0)
Monocytes Absolute: 1.5 10*3/uL — ABNORMAL HIGH (ref 0.1–1.0)
Monocytes Relative: 29 % — ABNORMAL HIGH (ref 3–12)
Neutro Abs: 2.1 10*3/uL (ref 1.7–7.7)
Neutrophils Relative %: 39 % — ABNORMAL LOW (ref 43–77)
Platelets: 169 10*3/uL (ref 150–400)
RBC: 5.19 MIL/uL (ref 4.22–5.81)
RDW: 11.7 % (ref 11.5–15.5)
WBC: 5.3 10*3/uL (ref 4.0–10.5)

## 2014-04-01 LAB — ETHANOL: Alcohol, Ethyl (B): <11 mg/dL (ref 0–11)

## 2014-04-01 LAB — CARBAMAZEPINE LEVEL, TOTAL: Carbamazepine Lvl: <0.5 ug/mL — ABNORMAL LOW (ref 4.0–12.0)

## 2014-04-01 MED ORDER — OLANZAPINE 10 MG PO TBDP
10.0000 mg | ORAL_TABLET | Freq: Two times a day (BID) | ORAL | Status: DC | PRN
  Filled 2014-04-01: qty 1

## 2014-04-01 MED ORDER — HYDROXYZINE HCL 50 MG PO TABS
50.0000 mg | ORAL_TABLET | Freq: Four times a day (QID) | ORAL | Status: DC | PRN
  Filled 2014-04-01: qty 1

## 2014-04-01 MED ORDER — ACETAMINOPHEN 325 MG PO TABS: 650.0000 mg | ORAL_TABLET | Freq: Four times a day (QID) | ORAL | Status: DC | PRN

## 2014-04-01 MED ORDER — QUETIAPINE FUMARATE 100 MG PO TABS
100.0000 mg | ORAL_TABLET | Freq: Every day | ORAL | Status: DC
  Filled 2014-04-01 (×2): qty 1

## 2014-04-01 MED ORDER — NICOTINE 14 MG/24HR TD PT24
14.0000 mg | MEDICATED_PATCH | Freq: Every day | TRANSDERMAL | Status: DC
  Administered 2014-04-03 – 2014-04-07 (×3): 14 mg via TRANSDERMAL
  Filled 2014-04-01 (×11): qty 1

## 2014-04-01 MED ORDER — ALUM & MAG HYDROXIDE-SIMETH 200-200-20 MG/5ML PO SUSP: 30.0000 mL | ORAL | Status: DC | PRN

## 2014-04-01 MED ORDER — MAGNESIUM HYDROXIDE 400 MG/5ML PO SUSP: 30.0000 mL | Freq: Every day | ORAL | Status: DC | PRN

## 2014-04-01 MED ORDER — CARBAMAZEPINE ER 200 MG PO TB12
200.0000 mg | ORAL_TABLET | Freq: Two times a day (BID) | ORAL | Status: DC
  Administered 2014-04-01 – 2014-04-05 (×8): 200 mg via ORAL
  Filled 2014-04-01 (×12): qty 1

## 2014-04-01 NOTE — Progress Notes (Signed)
Patient admitted early afternoon, sleeping in bed, took EKG machine to his room. Patient sat up in bed, crossed his arms, refused EKG. Stated he did not have any heart problems, had not been to [facility] since he was 18 yrs old, did not feel he needed EKG. Charge nurse and NP informed of patient's refusal to have EKG completed. Nurse explained to patient that EKG was for patient's safety, discussed procedure, not invasive procedure. Patient continued to refuse EKG. Nurse informed patient that we would not force him to have EKG.   EKG removed from patient's room.  Gatorade and water given to patient and patient stated he would provide urine sample.
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
## 2014-04-01 NOTE — Progress Notes (Signed)
Patient ID: Justin Chaney, male   DOB: October 27, 1995, 18 y.o.   MRN: [id number]  [name] is a walk-in to [facility] for psychosis. Patient reports he was dropped off here to get help. Patient states that he has been hearing voices commanding him to hurt other. Patient reports most of the time it is no one in particular. Patient denies visual hallucinations and denies SI/HI at this time. Patient has a past medical history of asthma, seizures (reports his last one was as a child), depression, bipolar disorder, and has a shunt. Patient verbalized understanding of the admission process. Patient denies pain or any distress. Patient was oriented to the unit and given lunch. Patient is DNA for the first 24 hours due to aggression and possible HI. Q15 minute safety checks were initiated and are maintained. Patient is safe at this time.

## 2014-04-01 NOTE — Tx Team (Signed)
Initial Interdisciplinary Treatment Plan   PATIENT STRESSORS: Medication change or noncompliance Hearing voices that are commanding him to hurt other people   PROBLEM LIST: Problem List/Patient Goals Date to be addressed Date deferred Reason deferred Estimated date of resolution  Psychosis 04/01/2014                                                      DISCHARGE CRITERIA:  Improved stabilization in mood, thinking, and/or behavior Safe-care adequate arrangements made  PRELIMINARY DISCHARGE PLAN: Attend PHP/IOP Outpatient therapy  PATIENT/FAMIILY INVOLVEMENT: This treatment plan has been presented to and reviewed with the patient, Justin Chaney.  The patient and family have been given the opportunity to ask questions and make suggestions.  Gaylan Gerold E 04/01/2014, 2:41 PM

## 2014-04-01 NOTE — H&P (Signed)
Psychiatric Admission Assessment Adult  Patient Identification:  Justin Chaney Date of Evaluation:  04/01/2014 Chief Complaint: " I have been hearing voices asking me to torture and kill"  History of Present Illness::Justin Chaney is a 18 year old AAM ,who presented as a walk in to [facility] for psychosis. Patient reports that he has been hearing voices since the past 3 days or so asking him to torture and kill people. Patient reports that he has been having this thoughts since the past several years ,started at the age of 60 y. He reports that when he hears this voices ,he does not do anything and it goes away after a while. He reports he never listens to it and that it leaves him angry and irritable. He reports that he has had mood swing and that when he get angry he destroys anything that he sees,however reports that he will not do it here and will not destroy hospital property.  Patient also reports VH ,and reports that it is hard to describe his visions. He feels that they are of another dimensions and are images that he cannot explain. He reports anxiety sx and feels like he needs help. He reports that the tegretol and abilify made him feel weird and so he stopped taking them. He also reports paranoia as well as ideas or reference and reports that his first name means "enke" and hence he is being talked about on TV . He reports that he has been abusing adderall since the past 3-4 months.  He was recently admitted to [facility] in July for similar presentation.  Elements:  Location:  psychosis,HI,Mood swings,ideas of reference. Quality:  has AH asking him to kill and torture,has HI 2/2 that,has ideas of reference that he is being talked about on TV. Severity:  Severe. Timing:  constant. Duration:  past 3-4 days. Context:  has hx of psychosis as well as bipolar do,noncompliance on medications. Associated Signs/Synptoms: Depression Symptoms:  depressed mood, insomnia, psychomotor agitation, (Hypo) Manic  Symptoms:  Delusions, Distractibility, Elevated Mood, Grandiosity, Impulsivity, Irritable Mood, Labiality of Mood, Anxiety Symptoms;racing thoughts Psychotic Symptoms:  Delusions, Hallucinations: Auditory Visual Ideas of Reference, Paranoia, PTSD Symptoms: Negative Total Time spent with patient: 1 hour  Psychiatric Specialty Exam: Physical Exam  Constitutional: He is oriented to person, place, and time. He appears well-developed and well-nourished.  HENT:  Head: Normocephalic.  Eyes: Pupils are equal, round, and reactive to light.  Neck: Normal range of motion. Neck supple.  Cardiovascular: Normal rate and regular rhythm.   GI: Soft.  Musculoskeletal: Normal range of motion.  Neurological: He is alert and oriented to person, place, and time.  Skin: Skin is warm.  Psychiatric: His speech is normal. His mood appears anxious. His affect is angry and labile. He is agitated, aggressive and actively hallucinating. Thought content is paranoid. Cognition and memory are normal. He expresses impulsivity. He exhibits a depressed mood. He expresses homicidal ideation. He is inattentive.    Review of Systems  Constitutional: Negative.   HENT: Negative.   Eyes: Negative.   Respiratory: Negative.   Cardiovascular: Negative.   Gastrointestinal: Negative.   Genitourinary: Negative.   Musculoskeletal: Negative.   Skin: Negative.   Neurological: Negative.   Endo/Heme/Allergies: Negative.   Psychiatric/Behavioral: Positive for depression, hallucinations and substance abuse. The patient is nervous/anxious and has insomnia.     Blood pressure 97/75, pulse 98, temperature 98.2 F (36.8 C), temperature source Oral, resp. rate 16, height 5\' 3"  (1.6 m), weight 82.555 kg (182 lb).Body  mass index is 32.25 kg/(m^2).  General Appearance: Bizarre  Eye Contact::  Fair  Speech:  Normal Rate  Volume:  Normal  Mood:  Angry, Anxious and Dysphoric  Affect:  Labile  Thought Process:  Disorganized and  Irrelevant  Orientation:  Full (Time, Place, and Person)  Thought Content:  Delusions, Hallucinations: Auditory Visual, Ideas of Reference:   Paranoia Delusions and Paranoid Ideation  Suicidal Thoughts:  No  Homicidal Thoughts:  Yes.  without intent/plan  Memory:  Immediate;   Fair Recent;   Fair Remote;   Fair  Judgement:  Impaired  Insight:  Lacking  Psychomotor Activity:  Normal  Concentration:  Fair  Recall:  [place]: Fair  Akathisia:  No    AIMS (if indicated):   0  Assets:  Communication Skills Desire for Improvement  Sleep:       Musculoskeletal: Strength & Muscle Tone: within normal limits Gait & Station: normal Patient leans: N/A  Past Psychiatric History: Diagnosis:bipolar disorder  Hospitalizations:several ,most recent at [facility] -7/15, [facility] -2015  Outpatient Care:Dr.Headen  Substance Abuse Care:denies  Self-Mutilation:denies  Suicidal Attempts:x3 -  Violent Behaviors:yes -several ,destruction of property   Past Medical History:   Past Medical History  Diagnosis Date  . Asthma   . Seizures   . Brain ventricular shunt obstruction     hydrochelpis  . Depression   . Bipolar disorder    None. Allergies:   Allergies  Allergen Reactions  . Amoxicillin Hives  . Penicillins Hives and Rash   PTA Medications: Prescriptions prior to admission  Medication Sig Dispense Refill  . ARIPiprazole (ABILIFY) 15 MG tablet Take 1 tablet (15 mg total) by mouth at bedtime.  30 tablet  0    Previous Psychotropic Medications:  Medication/Dose  Abilify,tegretol,zyprexa,trazodone (reports that all these meds make him feel weird and hence does not take them). Does not want to be prescribed trazodone.               Substance Abuse History in the last 12 months:  Yes.    Consequences of Substance Abuse: Negative  Social History:  reports that he has been smoking Cigarettes.  He has been smoking about 1.00 pack per day. He  does not have any smokeless tobacco history on file. He reports that he drinks about 7.2 ounces of alcohol per week. He reports that he uses illicit drugs (Marijuana, Other-see comments, and Amphetamines). Additional Social History: History of alcohol / drug use?: Yes Name of Substance 1:  (Marijuana) 1 - Age of First Use:  (14) 1 - Amount (size/oz):  (1 hit from a bowl) 1 - Frequency:  (pt reports seldom THC use and unable to specify) 1 - Duration:  (on and off for years since age 10) 1 - Last Use / Amount:  (03/31/14- "1 hit from a bowl") Name of Substance 2:  (Etoh-beer) 2 - Age of First Use:  (14) 2 - Amount (size/oz):  (1 can of beer unspecified amount) 2 - Frequency:  (pt reports occasional use, unable to specify) 2 - Duration:  (on-going use since age 22) 2 - Last Use / Amount:  (03/31/14- 1 beer)  Current Place of Residence:  [place] of Birth:  [place] Family Members:mother and 2 siblings Marital Status:  Single Children:none Relationships:relational struggles Education:  9th grade Educational Problems/Performance:yes Religious Beliefs/Practices:believes in god History of Abuse (Emotional/Phsycial/Sexual)-denies Occupational Experiences;yes currently works in Mid[employer] History:  None. Legal History:yes was in prison  for 13 months for unlawful act charges at the age of 27 Hobbies/Interests:none  Family History:  History reviewed. No pertinent family history.  No results found for this or any previous visit (from the past 72 hour(s)). Psychological Evaluations:  Assessment:   DSM5: Primary psychiatric diagnosis: Schizoaffective disorder,bipolar type,multiple episodes ,currently in acute episode  Secondary psychiatric diagnosis: Intermittent explosive disorder Stimulant use disorder (amphetamines) Tobacco use disorder  Non psychiatric diagnosis: Asthma (as a child) Seizure (as a child -last one at the age of 48y) Hx of Hydrocephalous (s/p vp  shunt placement)           Past Medical History  Diagnosis Date  . Asthma   . Seizures   . Brain ventricular shunt obstruction     hydrochelpis  . Depression   . Bipolar disorder    Treatment Plan/Recommendations:   Patient will benefit from inpatient treatment and stabilization.  Estimated length of stay is 5-7 days.  Reviewed past medical records,treatment plan.  Will continue to monitor vitals ,medication compliance and treatment side effects while patient is here.  Will monitor for medical issues as well as call consult as needed.  Will order all necessary labs -see below. CSW will start working on disposition.  Patient to participate in therapeutic milieu .   Will start the pastient on tegretol XR 200 mg po bid ,patient agrees to restart this . Will start Seroquel 100 mg po qhs for psychosis.Could titrate dose up as needed. Will add vistaril prn for anxiety. Will add Zyprexa zydis prn for agitation.      Treatment Plan Summary: Daily contact with patient to assess and evaluate symptoms and progress in treatment Medication management Current Medications:  Current Facility-Administered Medications  Medication Dose Route Frequency Provider Last Rate Last Dose  . acetaminophen (TYLENOL) tablet 650 mg  650 mg Oral Q6H PRN Ursula Alert, MD      . alum & mag hydroxide-simeth (MAALOX/MYLANTA) 200-200-20 MG/5ML suspension 30 mL  30 mL Oral Q4H PRN Ursula Alert, MD      . carbamazepine (TEGRETOL XR) 12 hr tablet 200 mg  200 mg Oral BID Ursula Alert, MD      . hydrOXYzine (ATARAX/VISTARIL) tablet 50 mg  50 mg Oral Q6H PRN Jerime Arif, MD      . magnesium hydroxide (MILK OF MAGNESIA) suspension 30 mL  30 mL Oral Daily PRN Ursula Alert, MD      . Derrill Memo ON 04/02/2014] nicotine (NICODERM CQ - dosed in mg/24 hours) patch 14 mg  14 mg Transdermal Q0600 Evaleen Sant, MD      . OLANZapine zydis (ZYPREXA) disintegrating tablet 10 mg  10 mg Oral BID PRN Tamecca Artiga,  MD      . QUEtiapine (SEROQUEL) tablet 100 mg  100 mg Oral QHS Ursula Alert, MD        Observation Level/Precautions:  15 minute checks  Laboratory: Will order routine labs CBC with diff,CMP,UDS,UA,ETOH level,EKG,TEGRETOL LEVEL  Psychotherapy:    Medications:    Consultations:    Discharge Concerns:    Estimated LOS:  Other:     I certify that inpatient services furnished can reasonably be expected to improve the patient's condition.   Shantika Bermea 8/31/20151:29 PM

## 2014-04-01 NOTE — [facility] Suicide Risk Assessment (Signed)
   Nursing information obtained from:  Patient Demographic factors:  Male;Adolescent or young adult;Unemployed Current Mental Status:  Thoughts of violence towards others Loss Factors:  NA Historical Factors:  Impulsivity Risk Reduction Factors:  Living with another person, especially a relative Total Time spent with patient: 20 minutes  CLINICAL FACTORS:   Alcohol/Substance Abuse/Dependencies Previous Psychiatric Diagnoses and Treatments  Psychiatric Specialty Exam: Physical Exam  Constitutional: He is oriented to person, place, and time. He appears well-developed and well-nourished.  HENT:  Head: Normocephalic.  Eyes: Pupils are equal, round, and reactive to light.  Neck: Normal range of motion. Neck supple.  Cardiovascular: Normal rate and regular rhythm.   GI: Soft.  Musculoskeletal: Normal range of motion.  Neurological: He is alert and oriented to person, place, and time.  Skin: Skin is warm.  Psychiatric: His speech is normal. His mood appears anxious. His affect is angry and labile. He is agitated and actively hallucinating. Thought content is paranoid and delusional. Cognition and memory are normal. He expresses impulsivity and inappropriate judgment. He expresses homicidal ideation.    Review of Systems  Constitutional: Negative.   HENT: Negative.   Eyes: Negative.   Respiratory: Negative.   Cardiovascular: Negative.   Gastrointestinal: Negative.   Genitourinary: Negative.   Musculoskeletal: Negative.   Skin: Negative.   Psychiatric/Behavioral: Positive for depression, hallucinations and substance abuse. The patient is nervous/anxious and has insomnia.     Blood pressure 97/75, pulse 98, temperature 98.2 F (36.8 C), temperature source Oral, resp. rate 16, height 5\' 3"  (1.6 m), weight 82.555 kg (182 lb).Body mass index is 32.25 kg/(m^2).  General Appearance: Disheveled  Eye Sport and exercise psychologist::  Fair  Speech:  Normal Rate  Volume:  Normal  Mood:  Anxious and Irritable   Affect:  Labile  Thought Process:  Disorganized and Irrelevant  Orientation:  Full (Time, Place, and Person)  Thought Content:  Delusions, Hallucinations: Auditory Visual and Ideas of Reference:   Paranoia Delusions  Suicidal Thoughts:  No  Homicidal Thoughts:  Yes.  without intent/plan  Memory:  Immediate;   Fair Recent;   Fair Remote;   Fair  Judgement:  Impaired  Insight:  Lacking  Psychomotor Activity:  Normal  Concentration:  Fair  Recall:  Poor  Fund of Knowledge:Fair  Language: Good  Akathisia:  No    AIMS (if indicated):   0  Assets:  Communication Skills Desire for Improvement  Sleep:      Musculoskeletal: Strength & Muscle Tone: within normal limits Gait & Station: normal Patient leans: N/A  COGNITIVE FEATURES THAT CONTRIBUTE TO RISK:  Closed-mindedness Polarized thinking Thought constriction (tunnel vision)    SUICIDE RISK:   Minimal: No identifiable suicidal ideation.  Patients presenting with no risk factors but with morbid ruminations; may be classified as minimal risk based on the severity of the depressive symptoms  PLAN OF CARE:  I certify that inpatient services furnished can reasonably be expected to improve the patient's condition.  Justin Chaney 04/01/2014, 1:24 PM

## 2014-04-02 LAB — URINALYSIS, ROUTINE W REFLEX MICROSCOPIC
Bilirubin Urine: NEGATIVE
Glucose, UA: NEGATIVE mg/dL
Hgb urine dipstick: NEGATIVE
Ketones, ur: NEGATIVE mg/dL
Leukocytes, UA: NEGATIVE
Nitrite: NEGATIVE
Protein, ur: NEGATIVE mg/dL
Specific Gravity, Urine: 1.027 (ref 1.005–1.030)
Urobilinogen, UA: 2 mg/dL — ABNORMAL HIGH (ref 0.0–1.0)
pH: 6.5 (ref 5.0–8.0)

## 2014-04-02 LAB — RAPID URINE DRUG SCREEN, HOSP PERFORMED
Amphetamines: POSITIVE — AB
Barbiturates: NOT DETECTED
Benzodiazepines: NOT DETECTED
Cocaine: POSITIVE — AB
Opiates: NOT DETECTED
Tetrahydrocannabinol: NOT DETECTED

## 2014-04-02 MED ORDER — QUETIAPINE FUMARATE 200 MG PO TABS
200.0000 mg | ORAL_TABLET | Freq: Every day | ORAL | Status: DC
  Administered 2014-04-02: 200 mg via ORAL
  Filled 2014-04-02 (×2): qty 1

## 2014-04-02 MED ORDER — SERTRALINE HCL 25 MG PO TABS
25.0000 mg | ORAL_TABLET | Freq: Every day | ORAL | Status: DC
  Administered 2014-04-02 – 2014-04-03 (×2): 25 mg via ORAL
  Filled 2014-04-02 (×3): qty 1

## 2014-04-02 NOTE — Progress Notes (Signed)
D: Pt lay on his stomach in bed during the assessment. When the writer opened his door the pt stated, "I don't want none". Writer still entered the room and explained what his medication is used for.  Pt repeated himself. Writer instructed the pt that she needed to turn the light on in order to see his face.   A:  Support and encouragement was offered. 15 min checks continued for safety.  R: Pt remains safe.

## 2014-04-02 NOTE — [facility] Group Notes (Signed)
[facility] LCSW Group Therapy  04/02/2014 , 2:52 PM   Type of Therapy:  Group Therapy  Participation Level:  Active  Participation Quality:  Attentive  Affect:  Appropriate  Cognitive:  Alert  Insight:  Improving  Engagement in Therapy:  Engaged  Modes of Intervention:  Discussion, Exploration and Socialization  Summary of Progress/Problems: Today's group focused on the term Diagnosis.  Participants were asked to define the term, and then pronounce whether it is a negative, positive or neutral term.  Justin Chaney was engaged throughout and attentive, though his input was minimal.  Talked about not needing medication for his diagnosis.  "I choose to use my mind to overcome challenges, not meds."  When challenged by other patients that he just told them he had already been here, he brushed it off by explaining that "this is a good place to clear your head."    Justin Chaney 04/02/2014 , 2:52 PM

## 2014-04-02 NOTE — Progress Notes (Signed)
The focus of this group is to educate the patient on the purpose and policies of crisis stabilization and provide a format to answer questions about their admission.  The group details unit policies and expectations of patients while admitted.  Patient did not attend 0900 nurse education orientation group this morning.  Patient stayed in his room. 

## 2014-04-02 NOTE — Tx Team (Signed)
  Date: 04/02/2014 10:11 AM  Progress in Treatment:  Attending groups: No Participating in groups: No Taking medication as prescribed: Yes Tolerating medication: Yes  Family/Significant othe contact made: No, CSW to assess for potential collateral contacts Patient understands diagnosis: Yes  Discussing patient identified problems/goals with staff: Yes  Medical problems stabilized or resolved: Yes  Denies suicidal/homicidal ideation: Yes Patient has not harmed self or Others: Yes   New problem(s) identified: none currently  Discharge Plan or Barriers: Unknown, CSW to assess for appropriate discharge plan  Additional comments: Justin Chaney is an 18 y.o. male. Pt presents to [facility] with C/O of psychosis and agitation. Patient reports that he is "Bipolar" and has "bad anger problems". Patient reports that his family said that he needed help somewhere. Patient presents agitated and guarded during assessment. Patient laughs inappropriately throughout assessment. Patient reports that he is hearing voices telling him to hurt, kill, and torture people. Patient reports that the voices are more intense when he is angry. Patient reports having thoughts of wanting to kill and torture people. Patient reports difficulty sleeping and eating. Patient states, "I don't care about life", " i just live it". Patient also states, "life is not real", implying issues with his perception and reality. Patient reports non-compliance with his psychiatric medications as he reports they don't work. Pt reports that he was prescribed Tegretol, Trazadone, and Abilify. Pt reports that he reports that he refuses to take the trazadone because its another sleep medication. Pt stopped taking his Tegretol a few weeks ago because he felt that it was not working. Pt discontinued with his Abilify injections and unknown time ago. Patient reports that he drank 1 can of beer and smoke 1 hit from a bowl of THC yesterday. Patient reports that he  thinks that the marijuana was laced with something. Pt denies SI and HI. Pt is unable to contract for safety and inpatient treatment is recommended for safety and stabilization.   Reason for Continuation of Hospitalization:  Medication stabilization Psychotic  Estimated length of stay: 3-5 days  For review of initial/current patient goals, please see plan of care.   Attendees:  Patient:    Family:    Physician: Dr. Shea Evans, MD  04/02/2014 10:11 AM  Nursing: Maudie Mercury RN; Rise Paganini, RN 04/02/2014 10:11 AM  Clinical Social Worker Norman Clay, MSW 04/02/2014 10:11 AM  Other: Clinical Social Worker [place], [place] 04/02/2014 10:11 AM  Other:  RN 04/02/2014 10:11 AM  Other: , RN Charge Nurse 04/02/2014 10:11 AM  Other:     [name], Latanya Presser MSW

## 2014-04-02 NOTE — Progress Notes (Addendum)
D:  Patient's self inventory sheet, patient slept good last night, did not take sleep medication.  Good appetite, normal energy level, good concentration span.  Rated depression 4, hopeless and anxiety #3.  Denied alcohol.  Denied physical problems.  Denied pain.   A:  Medications administered per MD orders.  Emotional support and encouragement given patient. R:  Denied SI.  Denied visual hallucinations.  Patient admits to HI, stated "Voices come and go telling me to hurt people.  I have thought how to hurt other people, but voices do not tell me how to do it."  "I love to hurt people."  Patient went to dining room for lunch.

## 2014-04-02 NOTE — [facility] Counselor (Signed)
Adult Psychosocial Assessment Update Interdisciplinary Team  Previous [facility] admissions/discharges:  Admissions Discharges  Date:02/07/14 Date:  Date: Date:  Date: Date:  Date: Date:  Date: Date:   Changes since the last Psychosocial Assessment (including adherence to outpatient mental health and/or substance abuse treatment, situational issues contributing to decompensation and/or relapse). Pt had difficulty verbalizing any specific stressors at the time of admission.  Pt unable to tell CSW about events leading up to admission but reports that he came to the hospital to get help with his "bipolar and his voices telling him to hurt/torture others."  Pt reports that he has not seen a psychiatrist or been compliant with his medications.              Discharge Plan 1. Will you be returning to the same living situation after discharge?   Yes: No:  X    If no, what is your plan?    Pt was staying with a friend prior to admission but is unsure whether he will be able to return there.         2. Would you like a referral for services when you are discharged? Yes:  X   If yes, for what services?  No:       For medication management and possibly therapy. At last discharge, Pt was scheduled for an appointment at [facility] but per Pt, he did not go to his appointment.       Summary and Recommendations (to be completed by the evaluator) Justin Chaney is an 18 y.o. male. Pt presents to [facility] with C/O of psychosis and agitation. Patient reports that he is "Bipolar" and has "bad anger problems". Patient reports that his family said that he needed help somewhere. Patient presents agitated and guarded during assessment. Patient laughs inappropriately throughout assessment. Patient reports that he is hearing voices telling him to hurt, kill, and torture people. Patient reports that the voices are more intense when he is angry. Patient reports having thoughts of  wanting to kill and torture people. Patient reports difficulty sleeping and eating. Patient states, "I don't care about life", " i just live it". Patient also states, "life is not real", implying issues with his perception and reality. Patient reports non-compliance with his psychiatric medications as he reports they don't work. Pt reports that he was prescribed Tegretol, Trazadone, and Abilify. Pt reports that he reports that he refuses to take the trazadone because its another sleep medication. Pt stopped taking his Tegretol a few weeks ago because he felt that it was not working. Pt discontinued with his Abilify injections and unknown time ago. Patient reports that he drank 1 can of beer and smoke 1 hit from a bowl of THC yesterday. Patient reports that he thinks that the marijuana was laced with something. Pt denies SI and HI. Pt is unable to contract for safety and inpatient treatment is recommended for safety and stabilization. Patient will benefit from crisis stabilization, medication evaluation, group therapy and psycho education in addition to case management for discharge planning.                        Signature:  Bo Mcclintock, 04/02/2014 1:40 PM

## 2014-04-02 NOTE — Progress Notes (Signed)
NUTRITION ASSESSMENT  Pt identified as at risk on the Malnutrition Screen Tool  INTERVENTION: Educated patient on the importance of nutrition and encouraged intake of food and beverages.   NUTRITION DIAGNOSIS: Disordered eating pattern related to pt's choice/psychosis as evidenced by pt report of sometimes skipping meals or not eating at all for 1-2 days.   Goal: Pt to meet >/= 90% of their estimated nutrition needs.  Monitor:  PO intake  Assessment:  Admitted with psychosis. Occasionally drinks a beer.   - Met with pt who reports not eating normal 3 meals/day because he doesn't want to - Reports meal intake variable, did not say what kinds of food he was eating - Unsure about changes in weight - Reports eating good today - Not interested in nutritional supplements    18 y.o. male  Height: Ht Readings from Last 1 Encounters:  04/01/14 _0  (1.6 m)    Weight: Wt Readings from Last 1 Encounters:  04/01/14 182 lb (82.555 kg)    Weight Hx: Wt Readings from Last 10 Encounters:  04/01/14 182 lb (82.555 kg)  02/07/14 178 lb (80.74 kg)    BMI:  Body mass index is 32.25 kg/(m^2). Pt meets criteria for class I obesity based on current BMI.  Estimated Nutritional Needs: Kcal: 25-30 kcal/kg of ideal body weight Protein: > 1 gram protein/kg Fluid: 1 ml/kcal  Diet Order: General Pt is also offered choice of unit snacks mid-morning and mid-afternoon.  Pt is eating as desired.   Lab results and medications reviewed. AST slightly elevated.  Carlis Stable MS, [place], LDN [phone] Pager [phone] Weekend/After Hours Pager

## 2014-04-02 NOTE — Progress Notes (Signed)
[facility] MD Progress Note  04/02/2014 11:23 AM Justin Chaney  MRN:  [id number] Subjective: " I still have thoughts about hurting people" Objective: Patient seen and chart reviewed. Patient continues to be agitated at times,irritable with hallucinations of voices asking him to hurt people . He also has intrusive thoughts of wanting to torture and kill. He reports periods of anger and irritability and destruction of property before coming in to the hospital. He reports that he tries to cope with his intrusive unwanted thoughts by writing music which helps him to relax. He denies SI ,but continues to have HI. He denies any side effects of medications.  Per staff he continues to be combative at times ,refused EKG and has difficulty cooperating with treatment plan.  Diagnosis:   DSM5:  Primary psychiatric diagnosis:  Schizoaffective disorder,bipolar type,multiple episodes ,currently in acute episode   Secondary psychiatric diagnosis:  Unspecified obsessive compulsive and related disorder Intermittent explosive disorder  Stimulant use disorder (amphetamines,cocaine) Tobacco use disorder   Non psychiatric diagnosis:  Asthma (as a child)  Seizure (as a child -last one at the age of 10y)  Hx of Hydrocephalous (s/p vp shunt placement     ADL's:  Intact  Sleep: Fair  Appetite:  Poor  Suicidal Ideation:  denies Homicidal Ideation:  Intent:  has thoughts as well as voices asking him to hurt people in general AEB (as evidenced by):  Psychiatric Specialty Exam: Physical Exam  Constitutional: He appears well-developed and well-nourished.  Psychiatric: His speech is normal. His affect is angry, labile and inappropriate. He is aggressive, actively hallucinating and combative. Thought content is delusional. Cognition and memory are normal. He expresses impulsivity and inappropriate judgment. He expresses homicidal ideation. He is inattentive.    Review of Systems  Constitutional: Negative.    HENT: Negative.   Respiratory: Negative.   Musculoskeletal: Negative.   Psychiatric/Behavioral: Positive for hallucinations. The patient is nervous/anxious.     Blood pressure 109/53, pulse 105, temperature 97.9 F (36.6 C), temperature source Oral, resp. rate 18, height 5' 3" (1.6 m), weight 82.555 kg (182 lb).Body mass index is 32.25 kg/(m^2).  General Appearance: Casual  Eye Contact::  Fair  Speech:  Normal Rate  Volume:  Normal  Mood:  Angry and Anxious  Affect:  Labile  Thought Process:  Disorganized and Irrelevant  Orientation:  Full (Time, Place, and Person)  Thought Content:  Obsessions  Suicidal Thoughts:  No  Homicidal Thoughts:  Yes.  without intent/plan  Memory:  Immediate;   Fair Recent;   Fair Remote;   Fair  Judgement:  Impaired  Insight:  Lacking  Psychomotor Activity:  Normal  Concentration:  Fair  Recall:  Fair  Fund of Knowledge:Fair  Language: Fair  Akathisia:  No    AIMS (if indicated):   0  Assets:  Communication Skills Desire for Improvement  Sleep:  Number of Hours: 5.75   Musculoskeletal: Strength & Muscle Tone: within normal limits Gait & Station: normal Patient leans: N/A  Current Medications: Current Facility-Administered Medications  Medication Dose Route Frequency Provider Last Rate Last Dose  . acetaminophen (TYLENOL) tablet 650 mg  650 mg Oral Q6H PRN  , MD      . alum & mag hydroxide-simeth (MAALOX/MYLANTA) 200-200-20 MG/5ML suspension 30 mL  30 mL Oral Q4H PRN  , MD      . carbamazepine (TEGRETOL XR) 12 hr tablet 200 mg  200 mg Oral BID  , MD   200 mg at 04/02/14 0824  . hydrOXYzine (  ATARAX/VISTARIL) tablet 50 mg  50 mg Oral Q6H PRN Norina Cowper, MD      . magnesium hydroxide (MILK OF MAGNESIA) suspension 30 mL  30 mL Oral Daily PRN Bryndon Cumbie, MD      . nicotine (NICODERM CQ - dosed in mg/24 hours) patch 14 mg  14 mg Transdermal Q0600 Kalley Nicholl, MD      . OLANZapine zydis (ZYPREXA)  disintegrating tablet 10 mg  10 mg Oral BID PRN Ursula Alert, MD      . QUEtiapine (SEROQUEL) tablet 100 mg  100 mg Oral QHS Ursula Alert, MD        Lab Results:  Results for orders placed during the hospital encounter of 04/01/14 (from the past 48 hour(s))  CBC WITH DIFFERENTIAL     Status: Abnormal   Collection Time    04/01/14  7:27 PM      Result Value Ref Range   WBC 5.3  4.0 - 10.5 K/uL   RBC 5.19  4.22 - 5.81 MIL/uL   Hemoglobin 14.6  13.0 - 17.0 g/dL   HCT 42.2  39.0 - 52.0 %   MCV 81.3  78.0 - 100.0 fL   MCH 28.1  26.0 - 34.0 pg   MCHC 34.6  30.0 - 36.0 g/dL   RDW 11.7  11.5 - 15.5 %   Platelets 169  150 - 400 K/uL   Neutrophils Relative % 39 (*) 43 - 77 %   Neutro Abs 2.1  1.7 - 7.7 K/uL   Lymphocytes Relative 29  12 - 46 %   Lymphs Abs 1.5  0.7 - 4.0 K/uL   Monocytes Relative 29 (*) 3 - 12 %   Monocytes Absolute 1.5 (*) 0.1 - 1.0 K/uL   Eosinophils Relative 3  0 - 5 %   Eosinophils Absolute 0.2  0.0 - 0.7 K/uL   Basophils Relative 0  0 - 1 %   Basophils Absolute 0.0  0.0 - 0.1 K/uL   Comment: Performed at [place]     Status: Abnormal   Collection Time    04/01/14  7:27 PM      Result Value Ref Range   Sodium 141  137 - 147 mEq/L   Potassium 3.8  3.7 - 5.3 mEq/L   Chloride 99  96 - 112 mEq/L   CO2 30  19 - 32 mEq/L   Glucose, Bld 92  70 - 99 mg/dL   BUN 12  6 - 23 mg/dL   Creatinine, Ser 1.16  0.50 - 1.35 mg/dL   Calcium 9.5  8.4 - 10.5 mg/dL   Total Protein 6.9  6.0 - 8.3 g/dL   Albumin 3.8  3.5 - 5.2 g/dL   AST 42 (*) 0 - 37 U/L   ALT 36  0 - 53 U/L   Alkaline Phosphatase 84  39 - 117 U/L   Total Bilirubin 0.7  0.3 - 1.2 mg/dL   GFR calc non Af Amer 90 (*) >90 mL/min   GFR calc Af Amer >90  >90 mL/min   Comment: (NOTE)     The eGFR has been calculated using the CKD EPI equation.     This calculation has not been validated in all clinical situations.     eGFR's persistently <90 mL/min signify  possible Chronic Kidney     Disease.   Anion gap 12  5 - 15   Comment: Performed at [facility]  ETHANOL     Status: None   Collection Time    04/01/14  7:27 PM      Result Value Ref Range   Alcohol, Ethyl (B) <11  0 - 11 mg/dL   Comment:            LOWEST DETECTABLE LIMIT FOR     SERUM ALCOHOL IS 11 mg/dL     FOR MEDICAL PURPOSES ONLY     Performed at [facility]  CARBAMAZEPINE LEVEL, TOTAL     Status: Abnormal   Collection Time    04/01/14  7:27 PM      Result Value Ref Range   Carbamazepine Lvl <0.5 (*) 4.0 - 12.0 ug/mL   Comment: Performed at [facility]     Status: Abnormal   Collection Time    04/01/14 10:00 PM      Result Value Ref Range   Opiates NONE DETECTED  NONE DETECTED   Cocaine POSITIVE (*) NONE DETECTED   Benzodiazepines NONE DETECTED  NONE DETECTED   Amphetamines POSITIVE (*) NONE DETECTED   Tetrahydrocannabinol NONE DETECTED  NONE DETECTED   Barbiturates NONE DETECTED  NONE DETECTED   Comment:            DRUG SCREEN FOR MEDICAL PURPOSES     ONLY.  IF CONFIRMATION IS NEEDED     FOR ANY PURPOSE, NOTIFY LAB     WITHIN 5 DAYS.                LOWEST DETECTABLE LIMITS     FOR URINE DRUG SCREEN     Drug Class       Cutoff (ng/mL)     Amphetamine      1000     Barbiturate      200     Benzodiazepine   226     Tricyclics       333     Opiates          300     Cocaine          300     THC              50     Performed at [facility] MICROSCOPIC     Status: Abnormal   Collection Time    04/01/14 10:00 PM      Result Value Ref Range   Color, Urine YELLOW  YELLOW   APPearance CLEAR  CLEAR   Specific Gravity, Urine 1.027  1.005 - 1.030   pH 6.5  5.0 - 8.0   Glucose, UA NEGATIVE  NEGATIVE mg/dL   Hgb urine dipstick NEGATIVE  NEGATIVE   Bilirubin Urine NEGATIVE  NEGATIVE   Ketones, ur NEGATIVE  NEGATIVE mg/dL   Protein,  ur NEGATIVE  NEGATIVE mg/dL   Urobilinogen, UA 2.0 (*) 0.0 - 1.0 mg/dL   Nitrite NEGATIVE  NEGATIVE   Leukocytes, UA NEGATIVE  NEGATIVE   Comment: MICROSCOPIC NOT DONE ON URINES WITH NEGATIVE PROTEIN, BLOOD, LEUKOCYTES, NITRITE, OR GLUCOSE <1000 mg/dL.     Performed at [facility]    Physical Findings: AIMS: Facial and Oral Movements Muscles of Facial Expression: None, normal Lips and Perioral Area: None, normal Jaw: None, normal Tongue: None, normal,Extremity Movements Upper (arms, wrists, hands, fingers): None, normal Lower (legs, knees, ankles, toes): None, normal, Trunk Movements Neck, shoulders, hips: None, normal, Overall Severity  Severity of abnormal movements (highest score from questions above): None, normal Incapacitation due to abnormal movements: None, normal Patient's awareness of abnormal movements (rate only patient's report): No Awareness,    CIWA:  CIWA-Ar Total: 2 COWS:  COWS Total Score: 3  Treatment Plan Summary: Daily contact with patient to assess and evaluate symptoms and progress in treatment Medication management  Plan: Will add Zoloft 25 mg daily for obsessive thoughts. Will continue tegretol XR 200 mg po bid ,patient agrees to restart this .  Will increase Seroquel to 200 mg po qhs for psychosis.Could titrate dose up as needed.  Will continue vistaril prn for anxiety.  Will continue Zyprexa zydis prn for agitation CSW will work on disposition. Patient to participate in therapeutic milieu.  Reviewed labs - uds positive for cocaine ,amphetamines.         Medical Decision Making Problem Points:  Established problem, worsening (2), New problem, with no additional work-up planned (3), Review of last therapy session (1) and Review of psycho-social stressors (1) Data Points:  Order [place] Assessment (2) Review or order clinical lab tests (1) Review and summation of old records (2) Review of medication regiment & side effects  (2) Review of new medications or change in dosage (2)  I certify that inpatient services furnished can reasonably be expected to improve the patient's condition.   , 04/02/2014, 11:23 AM  

## 2014-04-03 MED ORDER — SERTRALINE HCL 50 MG PO TABS
50.0000 mg | ORAL_TABLET | Freq: Every day | ORAL | Status: DC
  Administered 2014-04-04 – 2014-04-09 (×6): 50 mg via ORAL
  Filled 2014-04-03 (×8): qty 1

## 2014-04-03 MED ORDER — QUETIAPINE FUMARATE 300 MG PO TABS
300.0000 mg | ORAL_TABLET | Freq: Every day | ORAL | Status: DC
  Administered 2014-04-05 – 2014-04-07 (×3): 300 mg via ORAL
  Filled 2014-04-03 (×6): qty 1

## 2014-04-03 NOTE — [facility] Group Notes (Signed)
[facility] [employer] Group Therapy  04/03/2014 , 1:38 PM    Type of Therapy:  Mental [employer] Presentation  Participation Level:  Invited,  Chose to not attend  Summary of Progress/Problems:  [name] from [place] came to present his recovery story and play the guitar.    Roque Lias B 04/03/2014 , 1:38 PM

## 2014-04-03 NOTE — [facility] Group Notes (Signed)
[facility] LCSW Aftercare Discharge Planning Group Note   04/03/2014 11:24 AM  Participation Quality:  Did not attend    [place]

## 2014-04-03 NOTE — Progress Notes (Signed)
The focus of this group is to help patients review their daily goal of treatment and discuss progress on daily workbooks. Pt did not attend the evening group. 

## 2014-04-03 NOTE — Progress Notes (Signed)
Patient ID: Justin Chaney, male   DOB: 07/17/1996, 18 y.o.   MRN: [id number]  D: Pt was laying in bed prior to the assessment, and unlike previous interaction with the writer, this time the pt sat up to engage in conversation. Pt acknowledged having A/H at times, but didn't go into any details. Writer asked pt to discuss the conversation he had with his Dr. Abbott Pao tried, but giggled saying he couldn't remember.  Writer informed pt that she had a "night med" for him. Pt stated, "I don't want to take that seroquel". Writer encouraged pt to take prescribed meds in hopes of helping him get rid of some the voices and thoughts that he's been having. Pt acknowledged understanding and agreed to take the meds when offered.   A:  Support and encouragement was offered. 15 min checks continued for safety.  R: Pt remains safe.

## 2014-04-03 NOTE — Progress Notes (Signed)
D: Pt denies SI/AVH.  Pt avoids Probation officer. Pt stated " I'm just taking the meds to get out of here". Pt stated he would not answer if he was HI or not. Pt forwards little information.   A: Pt was offered support and encouragement. Pt was given scheduled medications. Pt was encourage to attend groups. Q 15 minute checks were done for safety.   R: Pt is taking medication.Pt receptive to treatment and safety maintained on unit.

## 2014-04-03 NOTE — Progress Notes (Signed)
[facility] MD Progress Note  04/03/2014 1:00 PM Justin Chaney  MRN:  [id number] Subjective: " my thoughts are still there" Objective: Patient seen and chart reviewed. Patient continues to have labile mood ,periodic irritability . He continues to [facility] off and on asking him to hurt people. He reports having intrusive thoughts which he is having a hard time resisting that makes him feel like he want to do all sort of bad things to people . He reports having them ever since the age of 62 y.  Patient continues to be unpredictable and will require continued Rx.  He denies SI ,but continues to have HI. He denies any side effects of medications.  Per staff patient has been periodically refusing medications and needs encouragement to take it.  Diagnosis:   DSM5:  Primary psychiatric diagnosis:  Schizoaffective disorder,bipolar type,multiple episodes ,currently in acute episode   Secondary psychiatric diagnosis:  Unspecified obsessive compulsive and related disorder Intermittent explosive disorder  Stimulant use disorder (amphetamines,cocaine) Tobacco use disorder   Non psychiatric diagnosis:  Asthma (as a child)  Seizure (as a child -last one at the age of 38y)  Hx of Hydrocephalous (s/p vp shunt placement     ADL's:  Intact  Sleep: Fair  Appetite:  Poor  Suicidal Ideation:  denies Homicidal Ideation:  Intent:  has thoughts as well as voices asking him to hurt people in general AEB (as evidenced by):  Psychiatric Specialty Exam: Physical Exam  Constitutional: He appears well-developed and well-nourished.  Psychiatric: His speech is normal. His affect is angry, labile and inappropriate. He is aggressive, actively hallucinating and combative. Thought content is delusional. Cognition and memory are normal. He expresses impulsivity and inappropriate judgment. He expresses homicidal ideation. He is inattentive.    Review of Systems  Constitutional: Negative.   HENT: Negative.    Respiratory: Negative.   Musculoskeletal: Negative.   Psychiatric/Behavioral: Positive for hallucinations. The patient is nervous/anxious.     Blood pressure 103/68, pulse 104, temperature 97.9 F (36.6 C), temperature source Oral, resp. rate 18, height '5\' 3"'  (1.6 m), weight 82.555 kg (182 lb).Body mass index is 32.25 kg/(m^2).  General Appearance: Casual  Eye Contact::  Fair  Speech:  Normal Rate  Volume:  Normal  Mood:  Angry and Anxious  Affect:  Labile  Thought Process:  Disorganized and Irrelevant,continues to have thoughts of hurting people   Orientation:  Full (Time, Place, and Person)  Thought Content:  Obsessions  Suicidal Thoughts:  No  Homicidal Thoughts:  Yes.  without intent/plan  Memory:  Immediate;   Fair Recent;   Fair Remote;   Fair  Judgement:  Impaired  Insight:  Lacking  Psychomotor Activity:  Normal  Concentration:  Fair  Recall:  [employer] of Knowledge:Fair  Language: Fair  Akathisia:  No    AIMS (if indicated):   0  Assets:  Communication Skills Desire for Improvement  Sleep:  Number of Hours: 6.75   Musculoskeletal: Strength & Muscle Tone: within normal limits Gait & Station: normal Patient leans: N/A  Current Medications: Current Facility-Administered Medications  Medication Dose Route Frequency Provider Last Rate Last Dose  . acetaminophen (TYLENOL) tablet 650 mg  650 mg Oral Q6H PRN Ursula Alert, MD      . alum & mag hydroxide-simeth (MAALOX/MYLANTA) 200-200-20 MG/5ML suspension 30 mL  30 mL Oral Q4H PRN Helen Winterhalter, MD      . carbamazepine (TEGRETOL XR) 12 hr tablet 200 mg  200 mg Oral BID Ursula Alert, MD  200 mg at 04/03/14 0920  . hydrOXYzine (ATARAX/VISTARIL) tablet 50 mg  50 mg Oral Q6H PRN Ursula Alert, MD      . magnesium hydroxide (MILK OF MAGNESIA) suspension 30 mL  30 mL Oral Daily PRN Katy Brickell, MD      . nicotine (NICODERM CQ - dosed in mg/24 hours) patch 14 mg  14 mg Transdermal Q0600 Ursula Alert, MD   14 mg  at 04/03/14 0922  . OLANZapine zydis (ZYPREXA) disintegrating tablet 10 mg  10 mg Oral BID PRN Ursula Alert, MD      . QUEtiapine (SEROQUEL) tablet 300 mg  300 mg Oral QHS Ursula Alert, MD      . Derrill Memo ON 04/04/2014] sertraline (ZOLOFT) tablet 50 mg  50 mg Oral Daily Ursula Alert, MD        Lab Results:  Results for orders placed during the hospital encounter of 04/01/14 (from the past 48 hour(s))  CBC WITH DIFFERENTIAL     Status: Abnormal   Collection Time    04/01/14  7:27 PM      Result Value Ref Range   WBC 5.3  4.0 - 10.5 K/uL   RBC 5.19  4.22 - 5.81 MIL/uL   Hemoglobin 14.6  13.0 - 17.0 g/dL   HCT 42.2  39.0 - 52.0 %   MCV 81.3  78.0 - 100.0 fL   MCH 28.1  26.0 - 34.0 pg   MCHC 34.6  30.0 - 36.0 g/dL   RDW 11.7  11.5 - 15.5 %   Platelets 169  150 - 400 K/uL   Neutrophils Relative % 39 (*) 43 - 77 %   Neutro Abs 2.1  1.7 - 7.7 K/uL   Lymphocytes Relative 29  12 - 46 %   Lymphs Abs 1.5  0.7 - 4.0 K/uL   Monocytes Relative 29 (*) 3 - 12 %   Monocytes Absolute 1.5 (*) 0.1 - 1.0 K/uL   Eosinophils Relative 3  0 - 5 %   Eosinophils Absolute 0.2  0.0 - 0.7 K/uL   Basophils Relative 0  0 - 1 %   Basophils Absolute 0.0  0.0 - 0.1 K/uL   Comment: Performed at [facility]     Status: Abnormal   Collection Time    04/01/14  7:27 PM      Result Value Ref Range   Sodium 141  137 - 147 mEq/L   Potassium 3.8  3.7 - 5.3 mEq/L   Chloride 99  96 - 112 mEq/L   CO2 30  19 - 32 mEq/L   Glucose, Bld 92  70 - 99 mg/dL   BUN 12  6 - 23 mg/dL   Creatinine, Ser 1.16  0.50 - 1.35 mg/dL   Calcium 9.5  8.4 - 10.5 mg/dL   Total Protein 6.9  6.0 - 8.3 g/dL   Albumin 3.8  3.5 - 5.2 g/dL   AST 42 (*) 0 - 37 U/L   ALT 36  0 - 53 U/L   Alkaline Phosphatase 84  39 - 117 U/L   Total Bilirubin 0.7  0.3 - 1.2 mg/dL   GFR calc non Af Amer 90 (*) >90 mL/min   GFR calc Af Amer >90  >90 mL/min   Comment: (NOTE)     The eGFR has been calculated  using the CKD EPI equation.     This calculation has not been validated in all clinical situations.  eGFR's persistently <90 mL/min signify possible Chronic Kidney     Disease.   Anion gap 12  5 - 15   Comment: Performed at [facility]     Status: None   Collection Time    04/01/14  7:27 PM      Result Value Ref Range   Alcohol, Ethyl (B) <11  0 - 11 mg/dL   Comment:            LOWEST DETECTABLE LIMIT FOR     SERUM ALCOHOL IS 11 mg/dL     FOR MEDICAL PURPOSES ONLY     Performed at [facility]  CARBAMAZEPINE LEVEL, TOTAL     Status: Abnormal   Collection Time    04/01/14  7:27 PM      Result Value Ref Range   Carbamazepine Lvl <0.5 (*) 4.0 - 12.0 ug/mL   Comment: Performed at [facility]     Status: Abnormal   Collection Time    04/01/14 10:00 PM      Result Value Ref Range   Opiates NONE DETECTED  NONE DETECTED   Cocaine POSITIVE (*) NONE DETECTED   Benzodiazepines NONE DETECTED  NONE DETECTED   Amphetamines POSITIVE (*) NONE DETECTED   Tetrahydrocannabinol NONE DETECTED  NONE DETECTED   Barbiturates NONE DETECTED  NONE DETECTED   Comment:            DRUG SCREEN FOR MEDICAL PURPOSES     ONLY.  IF CONFIRMATION IS NEEDED     FOR ANY PURPOSE, NOTIFY LAB     WITHIN 5 DAYS.                LOWEST DETECTABLE LIMITS     FOR URINE DRUG SCREEN     Drug Class       Cutoff (ng/mL)     Amphetamine      1000     Barbiturate      200     Benzodiazepine   017     Tricyclics       510     Opiates          300     Cocaine          300     THC              50     Performed at [facility] MICROSCOPIC     Status: Abnormal   Collection Time    04/01/14 10:00 PM      Result Value Ref Range   Color, Urine YELLOW  YELLOW   APPearance CLEAR  CLEAR   Specific Gravity, Urine 1.027  1.005 - 1.030   pH 6.5  5.0 - 8.0   Glucose, UA NEGATIVE   NEGATIVE mg/dL   Hgb urine dipstick NEGATIVE  NEGATIVE   Bilirubin Urine NEGATIVE  NEGATIVE   Ketones, ur NEGATIVE  NEGATIVE mg/dL   Protein, ur NEGATIVE  NEGATIVE mg/dL   Urobilinogen, UA 2.0 (*) 0.0 - 1.0 mg/dL   Nitrite NEGATIVE  NEGATIVE   Leukocytes, UA NEGATIVE  NEGATIVE   Comment: MICROSCOPIC NOT DONE ON URINES WITH NEGATIVE PROTEIN, BLOOD, LEUKOCYTES, NITRITE, OR GLUCOSE <1000 mg/dL.     Performed at [facility]    Physical Findings: AIMS: Facial and Oral Movements Muscles of Facial Expression: None, normal Lips and Perioral Area:  None, normal Jaw: None, normal Tongue: None, normal,Extremity Movements Upper (arms, wrists, hands, fingers): None, normal Lower (legs, knees, ankles, toes): None, normal, Trunk Movements Neck, shoulders, hips: None, normal, Overall Severity Severity of abnormal movements (highest score from questions above): None, normal Incapacitation due to abnormal movements: None, normal Patient's awareness of abnormal movements (rate only patient's report): No Awareness, Dental Status Current problems with teeth and/or dentures?: No Does patient usually wear dentures?: No  CIWA:  CIWA-Ar Total: 1 COWS:  COWS Total Score: 3  Treatment Plan Summary: Daily contact with patient to assess and evaluate symptoms and progress in treatment Medication management  Plan: Will increase Zoloft to 50 mg daily for obsessive thoughts. Will continue tegretol XR 200 mg po bid ,patient agrees to restart this .  Will increase Seroquel to 300 mg po qhs for psychosis.Could titrate dose up as needed.  Will continue vistaril prn for anxiety.  Will continue Zyprexa zydis prn for agitation CSW will work on disposition. Patient to participate in therapeutic milieu.  Reviewed labs - uds positive for cocaine ,amphetamines.         Medical Decision Making Problem Points:  Established problem, worsening (2), New problem, with no additional work-up  planned (3), Review of last therapy session (1) and Review of psycho-social stressors (1) Data Points:  Order Aims Assessment (2) Review or order clinical lab tests (1) Review and summation of old records (2) Review of medication regiment & side effects (2) Review of new medications or change in dosage (2)  I certify that inpatient services furnished can reasonably be expected to improve the patient's condition.   Justin Chaney 04/03/2014, 1:00 PM

## 2014-04-03 NOTE — Progress Notes (Signed)
Pt has been in bed most of the day.  He has been up for meals and snacks.  He refused his am medications but about an hour later he agreed to take them. He did admit to hearing voices "off and on" but would not elaborate on what the voices were saying.  He rated both his depression and hopelessness a 5 and anxiety a 3 on his self-inventory. He denied any S/H ideation or any V/H. He refused to make any goals for today.

## 2014-04-04 NOTE — [facility] Group Notes (Signed)
[facility] Group Notes:  (Nursing/MHT/Case Management/Adjunct)  Date:  04/04/2014  Time:  0900 am  Type of Therapy:  Psychoeducational Skills  Participation Level:  Did Not Attend  Justin Chaney 04/04/2014, 1:19 PM

## 2014-04-04 NOTE — [facility] Group Notes (Signed)
[facility] Group Notes:  (Counselor/Nursing/MHT/Case Management/Adjunct)  04/04/2014 1:15PM  Type of Therapy:  Group Therapy  Participation Level:  Active  Participation Quality:  Appropriate  Affect:  Flat  Cognitive:  Oriented  Insight:  Improving  Engagement in Group:  Limited  Engagement in Therapy:  Limited  Modes of Intervention:  Discussion, Exploration and Socialization  Summary of Progress/Problems: The topic for group was balance in life.  Pt participated in the discussion about when their life was in balance and out of balance and how this feels.  Pt discussed ways to get back in balance and short term goals they can work on to get where they want to be. Reluctantly came to group, but once there, appeared to enjoy himself, and was an active participant.  His thinking tends to be a bit concrete, so his contributions did not generate additional discussion, but he gets an A for effort.  He talked about balance as something that he is not sure he has ever experienced, and so is not sure if he is or not.  With the support of peers, he decided that being a part of this community has helped him feel more balanced.   [name] 04/04/2014 2:39 PM

## 2014-04-04 NOTE — Progress Notes (Signed)
Pt resting in bed with eyes closed.  No distress observed.  Respirations even/unlabored.  Safety maintained with q15 minute checks.

## 2014-04-04 NOTE — Plan of Care (Signed)
Problem: Alteration in thought process Goal: LTG-Patient behavior demonstrates decreased signs psychosis 04/02/14: Goal not met. Pt recently admitted with AH telling him to hurt others. [name], Anton 04/02/2014 10:09 AM  Symptoms persist. Goal not met. R north LCSW 04/04/2014 11:56 AM    (Patient behavior demonstrates decreased signs of psychosis to the point the patient is safe to return home and continue treatment in an outpatient setting.)  Outcome: Not Met (add Reason) Patient continues to have thoughts of harming other with no specifications.

## 2014-04-04 NOTE — Tx Team (Signed)
  Interdisciplinary Treatment Plan Update   Date Reviewed:  04/04/2014  Time Reviewed:  11:52 AM  Progress in Treatment:   Attending groups: Yes Participating in groups: Yes Taking medication as prescribed: Yes  Tolerating medication: Yes Family/Significant other contact made: Yes  Patient understands diagnosis: Yes  Discussing patient identified problems/goals with staff: Yes  See initial care plan Medical problems stabilized or resolved: Yes Denies suicidal/homicidal ideation: Yes  In tx team Patient has not harmed self or others: Yes  For review of initial/current patient goals, please see plan of care.  Estimated Length of Stay:  3-4 days  Reason for Continuation of Hospitalization: Medication stabilization Other; describe HI  New Problems/Goals identified:  N/A  Discharge Plan or Barriers:   return home, follow up outpt  Additional Comments: "I am doing better ,still have some thoughts'   Patient Appears to be more pleasant today. Reports that his intrusive thoughts are still there About hurting and killing people but he is able to resist them better. He reports that his AH is getting better.  He denies SI ,but continues to have HI.  Has refused Seroquel 2 of the last 3 days.   Attendees:  Signature: Steva Colder, MD 04/04/2014 11:52 AM   Signature: Ripley Fraise, LCSW 04/04/2014 11:52 AM  Signature:  04/04/2014 11:52 AM  Signature: Mayra Neer, RN 04/04/2014 11:52 AM  Signature:  04/04/2014 11:52 AM  Signature:  04/04/2014 11:52 AM  Signature:   04/04/2014 11:52 AM  Signature:    Signature:    Signature:    Signature:    Signature:    Signature:      Scribe for Treatment Team:   Ripley Fraise, LCSW  04/04/2014 11:52 AM

## 2014-04-04 NOTE — Progress Notes (Addendum)
Patient ID: Justin Chaney, male   DOB: Dec 10, 1995, 18 y.o.   MRN: [id number] D: Patient is more animated and talkative today and appears to be less anxious.  His mood is less labile.  Patient remains isolative to room electing to not attend group.  Patient is less resistant toward taking his medications.  Patient continues to have intermittent thoughts of harming other with no specifics.   A: Continue 15 minutes checks per protocol.  Educated patient re medications and indications.  Encourage patient to attend group and participate in his treatment. R: Patient is cooperative and calm.

## 2014-04-04 NOTE — Progress Notes (Signed)
[facility] MD Progress Note  04/04/2014 10:17 AM Justin Chaney  MRN:  [id number] Subjective: "I am doing better ,still have some thoughts' Objective: Patient seen and chart reviewed. Patient  Appears to be more pleasant today. Reports that his intrusive thoughts are still there  About hurting and killing people but he is able to resist them better. He reports that his AH is getting better.  He denies SI ,but continues to have HI. He denies any side effects of medications.  Per staff patient has been avoiding attending groups.However appears to be more pleasant.  Diagnosis:   DSM5:  Primary psychiatric diagnosis:  Schizoaffective disorder,bipolar type,multiple episodes ,currently in acute episode   Secondary psychiatric diagnosis:  Unspecified obsessive compulsive and related disorder Intermittent explosive disorder  Stimulant use disorder (amphetamines,cocaine) Tobacco use disorder   Non psychiatric diagnosis:  Asthma (as a child)  Seizure (as a child -last one at the age of 72y)  Hx of Hydrocephalous (s/p vp shunt placement     ADL's:  Intact  Sleep: Fair  Appetite:  Poor  Suicidal Ideation:  denies Homicidal Ideation:  Intent:  has thoughts as well as voices asking him to hurt people in general AEB (as evidenced by):  Psychiatric Specialty Exam: Physical Exam  Constitutional: He appears well-developed and well-nourished.  Psychiatric: His speech is normal. His affect is angry (more calmer,more pleasant), labile and inappropriate. He is actively hallucinating. Thought content is delusional. Cognition and memory are normal. He expresses impulsivity and inappropriate judgment. He expresses homicidal ideation. He is inattentive.    Review of Systems  Constitutional: Negative.   HENT: Negative.   Respiratory: Negative.   Musculoskeletal: Negative.   Psychiatric/Behavioral: Positive for hallucinations. The patient is nervous/anxious.     Blood pressure 122/70, pulse 96,  temperature 98.3 F (36.8 C), temperature source Oral, resp. rate 16, height 5\' 3"  (1.6 m), weight 82.555 kg (182 lb).Body mass index is 32.25 kg/(m^2).  General Appearance: Casual  Eye Contact::  Fair  Speech:  Normal Rate  Volume:  Normal  Mood:  Angry and Anxious  Affect:  Labile  Thought Process:  Disorganized and Irrelevant,continues to have thoughts of hurting people   Orientation:  Full (Time, Place, and Person)  Thought Content:  Obsessions  Suicidal Thoughts:  No  Homicidal Thoughts:  Yes.  without intent/plan  Memory:  Immediate;   Fair Recent;   Fair Remote;   Fair  Judgement:  Impaired  Insight:  Lacking  Psychomotor Activity:  Normal  Concentration:  Fair  Recall:  [employer] of Knowledge:Fair  Language: Fair  Akathisia:  No    AIMS (if indicated):   0  Assets:  Communication Skills Desire for Improvement  Sleep:  Number of Hours: 6.25   Musculoskeletal: Strength & Muscle Tone: within normal limits Gait & Station: normal Patient leans: N/A  Current Medications: Current Facility-Administered Medications  Medication Dose Route Frequency Provider Last Rate Last Dose  . acetaminophen (TYLENOL) tablet 650 mg  650 mg Oral Q6H PRN Ursula Alert, MD      . alum & mag hydroxide-simeth (MAALOX/MYLANTA) 200-200-20 MG/5ML suspension 30 mL  30 mL Oral Q4H PRN Ursula Alert, MD      . carbamazepine (TEGRETOL XR) 12 hr tablet 200 mg  200 mg Oral BID Ursula Alert, MD   200 mg at 04/04/14 0814  . hydrOXYzine (ATARAX/VISTARIL) tablet 50 mg  50 mg Oral Q6H PRN Ursula Alert, MD      . magnesium hydroxide (MILK OF MAGNESIA)  suspension 30 mL  30 mL Oral Daily PRN Ursula Alert, MD      . nicotine (NICODERM CQ - dosed in mg/24 hours) patch 14 mg  14 mg Transdermal Q0600 Ursula Alert, MD   14 mg at 04/04/14 0645  . OLANZapine zydis (ZYPREXA) disintegrating tablet 10 mg  10 mg Oral BID PRN Ursula Alert, MD      . QUEtiapine (SEROQUEL) tablet 300 mg  300 mg Oral QHS Jovi Zavadil, MD      . sertraline (ZOLOFT) tablet 50 mg  50 mg Oral Daily Ursula Alert, MD   50 mg at 04/04/14 4709    Lab Results:  No results found for this or any previous visit (from the past 48 hour(s)).  Physical Findings: AIMS: Facial and Oral Movements Muscles of Facial Expression: None, normal Lips and Perioral Area: None, normal Jaw: None, normal Tongue: None, normal,Extremity Movements Upper (arms, wrists, hands, fingers): None, normal Lower (legs, knees, ankles, toes): None, normal, Trunk Movements Neck, shoulders, hips: None, normal, Overall Severity Severity of abnormal movements (highest score from questions above): None, normal Incapacitation due to abnormal movements: None, normal Patient's awareness of abnormal movements (rate only patient's report): No Awareness, Dental Status Current problems with teeth and/or dentures?: No Does patient usually wear dentures?: No  CIWA:  CIWA-Ar Total: 1 COWS:  COWS Total Score: 3  Treatment Plan Summary: Daily contact with patient to assess and evaluate symptoms and progress in treatment Medication management  Plan: Will continue  Zoloft  50 mg daily for obsessive thoughts. Will continue tegretol XR 200 mg po bid for mood swings ,irritability. Will continue Seroquel  300 mg po qhs for psychosis.Could titrate dose up as needed.  Will continue vistaril prn for anxiety.  Will continue Zyprexa zydis prn for agitation CSW will work on disposition. Patient to participate in therapeutic milieu. Patient to be encouraged to attend groups.  Reviewed labs - uds positive for cocaine ,amphetamines.         Medical Decision Making Problem Points:  Established problem, stable/improving (1), Review of last therapy session (1) and Review of psycho-social stressors (1) Data Points:  Order Aims Assessment (2) Review or order clinical lab tests (1) Review and summation of old records (2) Review of medication regiment & side effects  (2) Review of new medications or change in dosage (2)  I certify that inpatient services furnished can reasonably be expected to improve the patient's condition.   Justin Chaney 04/04/2014, 10:17 AM

## 2014-04-05 MED ORDER — CARBAMAZEPINE ER 200 MG PO TB12
300.0000 mg | ORAL_TABLET | Freq: Two times a day (BID) | ORAL | Status: DC
  Administered 2014-04-05 – 2014-04-10 (×10): 300 mg via ORAL
  Filled 2014-04-05 (×16): qty 1

## 2014-04-05 NOTE — Progress Notes (Signed)
[facility] MD Progress Note  04/05/2014 3:22 PM Justin Chaney  MRN:  [id number] Subjective: "I have the spirit of enki " in me",I know I am him. Objective: Patient seen and chart reviewed. Patient continues to have recurring thoughts of hurting people,but they are improving. He is delusional about being 'Enki" the [place] god and reports he has his spirit in him. Denies SI ,but continues to have HI ,but able to resist it better.    He denies any side effects of medications.  Per staff patient has been noncompliant on his seroquel.  Diagnosis:   DSM5:  Primary psychiatric diagnosis:  Schizoaffective disorder,bipolar type,multiple episodes ,currently in acute episode   Secondary psychiatric diagnosis:  Unspecified obsessive compulsive and related disorder Intermittent explosive disorder  Stimulant use disorder (amphetamines,cocaine) Tobacco use disorder   Non psychiatric diagnosis:  Asthma (as a child)  Seizure (as a child -last one at the age of 54y)  Hx of Hydrocephalous (s/p vp shunt placement     ADL's:  Intact  Sleep: Fair  Appetite:  Fair  Suicidal Ideation:  denies Homicidal Ideation:  Intent:  has thoughts as well as voices asking him to hurt people in general AEB (as evidenced by):  Psychiatric Specialty Exam: Physical Exam  Constitutional: He appears well-developed and well-nourished.  Psychiatric: His speech is normal. His affect is angry (more calmer,more pleasant), labile and inappropriate. He is actively hallucinating. Thought content is delusional. Cognition and memory are normal. He expresses impulsivity and inappropriate judgment. He expresses homicidal ideation. He is inattentive.    Review of Systems  Constitutional: Negative.   HENT: Negative.   Respiratory: Negative.   Musculoskeletal: Negative.   Psychiatric/Behavioral: Positive for hallucinations. The patient is nervous/anxious.     Blood pressure 129/75, pulse 100, temperature 98.6 F (37 C),  temperature source Oral, resp. rate 18, height 5\' 3"  (1.6 m), weight 82.555 kg (182 lb).Body mass index is 32.25 kg/(m^2).  General Appearance: Casual  Eye Contact::  Fair  Speech:  Normal Rate  Volume:  Normal  Mood:  Angry and Anxious  Affect:  Labile  Thought Process:  Disorganized and Irrelevant,continues to have thoughts of hurting people   Orientation:  Full (Time, Place, and Person)  Thought Content:  Obsessions  Suicidal Thoughts:  No  Homicidal Thoughts:  Yes.  without intent/plan passive  Memory:  Immediate;   Fair Recent;   Fair Remote;   Fair  Judgement:  Impaired  Insight:  Lacking  Psychomotor Activity:  Normal  Concentration:  Fair  Recall:  [place]  Language: Fair  Akathisia:  No    AIMS (if indicated):   0  Assets:  Communication Skills Desire for Improvement  Sleep:  Number of Hours: 5.5   Musculoskeletal: Strength & Muscle Tone: within normal limits Gait & Station: normal Patient leans: N/A  Current Medications: Current Facility-Administered Medications  Medication Dose Route Frequency Provider Last Rate Last Dose  . acetaminophen (TYLENOL) tablet 650 mg  650 mg Oral Q6H PRN Ursula Alert, MD      . alum & mag hydroxide-simeth (MAALOX/MYLANTA) 200-200-20 MG/5ML suspension 30 mL  30 mL Oral Q4H PRN Ursula Alert, MD      . carbamazepine (TEGRETOL XR) 12 hr tablet 200 mg  200 mg Oral BID Ursula Alert, MD   200 mg at 04/05/14 0813  . hydrOXYzine (ATARAX/VISTARIL) tablet 50 mg  50 mg Oral Q6H PRN Ursula Alert, MD      . magnesium hydroxide (MILK OF MAGNESIA)  suspension 30 mL  30 mL Oral Daily PRN Ursula Alert, MD      . nicotine (NICODERM CQ - dosed in mg/24 hours) patch 14 mg  14 mg Transdermal Q0600 Ursula Alert, MD   14 mg at 04/04/14 0645  . OLANZapine zydis (ZYPREXA) disintegrating tablet 10 mg  10 mg Oral BID PRN Ursula Alert, MD      . QUEtiapine (SEROQUEL) tablet 300 mg  300 mg Oral QHS Jaimy Kliethermes, MD      .  sertraline (ZOLOFT) tablet 50 mg  50 mg Oral Daily Amalio Loe, MD   50 mg at 04/05/14 0813    Lab Results:  No results found for this or any previous visit (from the past 23 hour(s)).  Physical Findings: AIMS: Facial and Oral Movements Muscles of Facial Expression: None, normal Lips and Perioral Area: None, normal Jaw: None, normal Tongue: None, normal,Extremity Movements Upper (arms, wrists, hands, fingers): None, normal Lower (legs, knees, ankles, toes): None, normal, Trunk Movements Neck, shoulders, hips: None, normal, Overall Severity Severity of abnormal movements (highest score from questions above): None, normal Incapacitation due to abnormal movements: None, normal Patient's awareness of abnormal movements (rate only patient's report): No Awareness, Dental Status Current problems with teeth and/or dentures?: No Does patient usually wear dentures?: No  CIWA:  CIWA-Ar Total: 1 COWS:  COWS Total Score: 3  Treatment Plan Summary: Daily contact with patient to assess and evaluate symptoms and progress in treatment Medication management  Plan: Will continue  Zoloft  50 mg daily for obsessive thoughts. Will increase tegretol XR 300 mg po bid for mood swings ,irritability. Will continue Seroquel  300 mg po qhs for psychosis.Could titrate dose up as needed.  Will continue vistaril prn for anxiety.  Will continue Zyprexa zydis prn for agitation CSW will work on disposition. Patient to participate in therapeutic milieu. Patient to be encouraged to attend groups.  Reviewed labs - uds positive for cocaine ,amphetamines.         Medical Decision Making Problem Points:  Established problem, stable/improving (1), Review of last therapy session (1) and Review of psycho-social stressors (1) Data Points:  Order Aims Assessment (2) Review or order clinical lab tests (1) Review and summation of old records (2) Review of medication regiment & side effects (2) Review of new  medications or change in dosage (2)  I certify that inpatient services furnished can reasonably be expected to improve the patient's condition.   Justin Chaney 04/05/2014, 3:22 PM

## 2014-04-05 NOTE — BHH Group Notes (Signed)
[facility] LCSW Aftercare Discharge Planning Group Note   04/05/2014 10:31 AM  Participation Quality:  Invited.  Chose to not attend    [place], Barbaraann Rondo B

## 2014-04-05 NOTE — Progress Notes (Signed)
Pt stated,'I have been hearing voices this morning and they are bad.I can not tell you what they are saying." pt does contract for safety and denies SI and HI. He did take a shower this am and states he feels better. Pt believes he is a [place]ario[place] god with powers. He is quiet and tends to isolate quite a bit.

## 2014-04-05 NOTE — BHH Group Notes (Signed)
[facility] LCSW Group Therapy  04/05/2014  1:05 PM  Type of Therapy:  Group therapy  Participation Level:  Active  Participation Quality:  Attentive  Affect:  Flat  Cognitive:  Oriented  Insight:  Limited  Engagement in Therapy:  Limited  Modes of Intervention:  Discussion, Socialization  Summary of Progress/Problems:  Chaplain was here to lead a group on themes of hope and courage.  "I'm hopeful because I am alive.  Every day is a new day with new possibilities.  I can be around other people and I a Can learn.  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  Roque Lias B 04/05/2014 11:17 AM

## 2014-04-05 NOTE — Progress Notes (Signed)
D. Pt has been up and active in milieu this evening, however chose not to participate in evening wrap-up group. Pt spoke about being there as he was having anger issues, and intrusive thoughts but how he is working on being able to better control them. Pt did refuse HS dose of seroquel and stated that he did not want to take it because it was going to make his pineal gland stiff. Pt did receive other medications without incident. A. Support and encouragement provided. R. Safety maintained, will continue to monitor.

## 2014-04-06 DIAGNOSIS — R45850 Homicidal ideations: Secondary | ICD-10-CM

## 2014-04-06 DIAGNOSIS — F319 Bipolar disorder, unspecified: Secondary | ICD-10-CM

## 2014-04-06 DIAGNOSIS — F6381 Intermittent explosive disorder: Secondary | ICD-10-CM

## 2014-04-06 DIAGNOSIS — F17200 Nicotine dependence, unspecified, uncomplicated: Secondary | ICD-10-CM

## 2014-04-06 DIAGNOSIS — F429 Obsessive-compulsive disorder, unspecified: Secondary | ICD-10-CM

## 2014-04-06 DIAGNOSIS — F1910 Other psychoactive substance abuse, uncomplicated: Secondary | ICD-10-CM

## 2014-04-06 DIAGNOSIS — F259 Schizoaffective disorder, unspecified: Principal | ICD-10-CM

## 2014-04-06 NOTE — BHH Group Notes (Signed)
[place] Group Notes:  (Clinical Social Work)  04/06/2014  11:15-12:00PM  Summary of Progress/Problems:   The main focus of today's process group was to discuss patients' feelings about hospitalization, the stigma attached to mental health, and sources of motivation to stay well.  We then worked to identify a specific plan to avoid future hospitalizations when discharged from the hospital for this admission.  The patient expressed himself well throughout group, first stating that he is in the hospital for hearing voices.  He stated that his feelings about being in the hospital are that he is "trapped, labeled, and less of a human being."  He said that he feels that some of the doctors look down on patients, because they do not know what people with these symptoms go through.  He was very interactive and open to the concepts presented.  Type of Therapy:  Group Therapy - Process  Participation Level:  Active  Participation Quality:  Attentive, Sharing and Supportive  Affect:  Appropriate  Cognitive:  Appropriate  Insight:  Engaged  Engagement in Therapy:  Engaged  Modes of Intervention:  Exploration, Discussion  Selmer Dominion, LCSW 04/06/2014, 12:38 PM

## 2014-04-06 NOTE — Progress Notes (Signed)
D. Pt has been up and has been visible in milieu this evening, has been participating in some activities. Pt did speak some about his on-going anger issues and spoke about how he is going to take all his medications while he is here because that's what we want him to do. A. Support and encouragement provided and reinforced importance of taking medications as prescribed. R. Pt verbalized understanding, safety maintained.

## 2014-04-06 NOTE — Progress Notes (Signed)
Psychoeducational Group Note  Date:  04/06/2014 Time:  0900  Group Topic/Focus:  Making Healthy Choices:   The focus of this group is to help patients identify negative/unhealthy choices they were using prior to admission and identify positive/healthier coping strategies to replace them upon discharge.  Participation Level: Did Not Attend  Participation Quality:  Not Applicable  Affect:  Not Applicable  Cognitive:  Not Applicable  Insight:  Not Applicable  Engagement in Group: Not Applicable  Additional Comments:  Did not attend  Clinton Sawyer [facility] 04/06/2014, 9:56 AM

## 2014-04-06 NOTE — Progress Notes (Signed)
[place] Group Notes:  (Nursing/MHT/Case Management/Adjunct)  Date:  04/06/2014  Time:  9:27 PM  Type of Therapy:  Group Therapy  Participation Level:  Did Not Attend  Participation Quality:  Did Not Attend  Affect:  Did Not Attend  Cognitive:  Did Not Attend  Insight:  None  Engagement in Group:  Did Not Attend  Modes of Intervention:  Socialization and Support  Summary of Progress/Problems: Pt. Did not Attend group.  Lanell Persons 04/06/2014, 9:27 PM

## 2014-04-06 NOTE — Progress Notes (Signed)
Patient ID: Justin Chaney, male   DOB: July 24, 1996, 18 y.o.   MRN: [id number] [facility] MD Progress Note  04/06/2014 12:19 PM Justin Chaney  MRN:  [id number] Subjective: "I am doing better on my medication but the the spirit of enki " in me", I know I am him.'' Objective: Patient seen and his chart was reviewed. Patient reports having decreased mood swings, irritability,  thoughts of hurting people and sleeping better. He is delusional about being 'Enki" the [place] god and reports he has his spirit in him. Patient denies suicidal thoughts but continues to have homicidal thoughts towards no one in particular. Patient is compliant with his medications and has not verbalized any adverse reactions.      Diagnosis:   DSM5:  Primary psychiatric diagnosis:  Schizoaffective disorder,bipolar type,multiple episodes ,currently in acute episode   Secondary psychiatric diagnosis:  Unspecified obsessive compulsive and related disorder Intermittent explosive disorder  Stimulant use disorder (amphetamines,cocaine) Tobacco use disorder   Non psychiatric diagnosis:  Asthma (as a child)  Seizure (as a child -last one at the age of 55y)  Hx of Hydrocephalous (s/p vp shunt placement     ADL's:  Intact  Sleep: Fair  Appetite:  Fair  Suicidal Ideation:  denies Homicidal Ideation:  Intent:  has thoughts as well as voices asking him to hurt people in general AEB (as evidenced by):  Psychiatric Specialty Exam: Physical Exam  Constitutional: He appears well-developed and well-nourished.  Psychiatric: His speech is normal. His affect is angry (more calmer,more pleasant), labile and inappropriate. He is actively hallucinating. Thought content is delusional. Cognition and memory are normal. He expresses impulsivity and inappropriate judgment. He expresses homicidal ideation. He is inattentive.    Review of Systems  Constitutional: Negative.   HENT: Negative.   Respiratory: Negative.    Musculoskeletal: Negative.   Psychiatric/Behavioral: Positive for hallucinations. The patient is nervous/anxious.     Blood pressure 123/94, pulse 110, temperature 98.5 F (36.9 C), temperature source Oral, resp. rate 18, height 5\' 3"  (1.6 m), weight 82.555 kg (182 lb).Body mass index is 32.25 kg/(m^2).  General Appearance: Casual  Eye Contact::  Fair  Speech:  Normal Rate  Volume:  Normal  Mood:  Angry and Anxious  Affect:  Labile  Thought Process:  Disorganized and Irrelevant,continues to have thoughts of hurting people   Orientation:  Full (Time, Place, and Person)  Thought Content:  Obsessions  Suicidal Thoughts:  No  Homicidal Thoughts:  Yes.  without intent/plan passive  Memory:  Immediate;   Fair Recent;   Fair Remote;   Fair  Judgement:  Impaired  Insight:  Lacking  Psychomotor Activity:  Normal  Concentration:  Fair  Recall:  [employer] of Knowledge:Fair  Language: Fair  Akathisia:  No    AIMS (if indicated):   0  Assets:  Communication Skills Desire for Improvement  Sleep:  Number of Hours: 6.75   Musculoskeletal: Strength & Muscle Tone: within normal limits Gait & Station: normal Patient leans: N/A  Current Medications: Current Facility-Administered Medications  Medication Dose Route Frequency Provider Last Rate Last Dose  . acetaminophen (TYLENOL) tablet 650 mg  650 mg Oral Q6H PRN Ursula Alert, MD      . alum & mag hydroxide-simeth (MAALOX/MYLANTA) 200-200-20 MG/5ML suspension 30 mL  30 mL Oral Q4H PRN Ursula Alert, MD      . carbamazepine (TEGRETOL XR) 12 hr tablet 300 mg  300 mg Oral BID Ursula Alert, MD   300 mg at 04/06/14 0758  .  hydrOXYzine (ATARAX/VISTARIL) tablet 50 mg  50 mg Oral Q6H PRN Saramma Eappen, MD      . magnesium hydroxide (MILK OF MAGNESIA) suspension 30 mL  30 mL Oral Daily PRN Saramma Eappen, MD      . nicotine (NICODERM CQ - dosed in mg/24 hours) patch 14 mg  14 mg Transdermal Q0600 Ursula Alert, MD   14 mg at 04/04/14 0645   . OLANZapine zydis (ZYPREXA) disintegrating tablet 10 mg  10 mg Oral BID PRN Ursula Alert, MD      . QUEtiapine (SEROQUEL) tablet 300 mg  300 mg Oral QHS Ursula Alert, MD   300 mg at 04/05/14 2154  . sertraline (ZOLOFT) tablet 50 mg  50 mg Oral Daily Ursula Alert, MD   50 mg at 04/06/14 4503    Lab Results:  No results found for this or any previous visit (from the past 48 hour(s)).  Physical Findings: AIMS: Facial and Oral Movements Muscles of Facial Expression: None, normal Lips and Perioral Area: None, normal Jaw: None, normal Tongue: None, normal,Extremity Movements Upper (arms, wrists, hands, fingers): None, normal Lower (legs, knees, ankles, toes): None, normal, Trunk Movements Neck, shoulders, hips: None, normal, Overall Severity Severity of abnormal movements (highest score from questions above): None, normal Incapacitation due to abnormal movements: None, normal Patient's awareness of abnormal movements (rate only patient's report): No Awareness, Dental Status Current problems with teeth and/or dentures?: No Does patient usually wear dentures?: No  CIWA:  CIWA-Ar Total: 1 COWS:  COWS Total Score: 3  Treatment Plan Summary: Daily contact with patient to assess and evaluate symptoms and progress in treatment Medication management  Plan: Will continue  Zoloft  50 mg daily for obsessive thoughts. Will increase tegretol XR 300 mg po bid for mood swings ,irritability. Will continue Seroquel  300 mg po qhs for psychosis.Could titrate dose up as needed.  Will continue vistaril prn for anxiety.  Will continue Zyprexa zydis prn for agitation CSW will work on disposition. Patient to participate in therapeutic milieu. Patient to be encouraged to attend groups.  Reviewed labs - uds positive for cocaine ,amphetamines on admission         Medical Decision Making Problem Points:  Established problem, stable/improving (1), Review of last therapy session (1) and Review  of psycho-social stressors (1) Data Points:  Order Aims Assessment (2) Review or order clinical lab tests (1) Review and summation of old records (2) Review of medication regiment & side effects (2) Review of new medications or change in dosage (2)  I certify that inpatient services furnished can reasonably be expected to improve the patient's condition.   Corena Pilgrim, MD 04/06/2014, 12:19 PM

## 2014-04-06 NOTE — Progress Notes (Signed)
Patient ID: Justin Chaney, male   DOB: [birth year], 18 y.o.   MRN: [id number] D)  Was lying in his room in the dark this evening, and neede some encouragement to come to group, but decided he would, walked to the dayroom, said he didn't see a seat, and went back to his room and got back into bed.  Did come back to the dayroom shortly after for a snack, said he just wasn't comfortable coming to group tonight and was too tired, although he appeared bright and wanted to tell a story to a staff member, talkative with male staff members. Did come to med window afterward for hs seroquel, pleasant and cooperative with meds. A)  Will continue to monitor for safety, continue POC R)  Safety maintained.

## 2014-04-06 NOTE — Progress Notes (Signed)
Patient ID: Justin Chaney, male   DOB: Aug 31, 1995, 18 y.o.   MRN: [id number] Patient asleep; no s/s of distress noted. Respirations regular and unlabored. Q 15 minute safety checks maintained.

## 2014-04-07 MED ORDER — OLANZAPINE 10 MG PO TBDP
10.0000 mg | ORAL_TABLET | Freq: Three times a day (TID) | ORAL | Status: DC | PRN
  Administered 2014-04-09: 10 mg via ORAL
  Filled 2014-04-07: qty 1

## 2014-04-07 NOTE — Progress Notes (Signed)
Patient ID: Justin Chaney, male   DOB: 1995-09-14, 18 y.o.   MRN: [id number] [facility] MD Progress Note  04/07/2014 10:56 AM Justin Chaney  MRN:  [id number] Subjective: "I am very irritable this morning and I am not in the mood to talk to people.'' ' Objective: Patient seen and his chart was reviewed. Patient reports having increased mood swings, irritability and  thoughts of hurting people. He remains  delusional about being an ''Enki" the [place] god and reports he has his spirit in him. Patient denies suicidal thoughts but continues to have homicidal thoughts towards other people that he refused to name. Patient is compliant with his medications and has not verbalized any adverse reactions other feeling a little drowsy.  Diagnosis:   DSM5:  Primary psychiatric diagnosis:  Schizoaffective disorder,bipolar type,multiple episodes ,currently in acute episode   Secondary psychiatric diagnosis:  Unspecified obsessive compulsive and related disorder Intermittent explosive disorder  Stimulant use disorder (amphetamines,cocaine) Tobacco use disorder   Non psychiatric diagnosis:  Asthma (as a child)  Seizure (as a child -last one at the age of 36y)  Hx of Hydrocephalous (s/p vp shunt placement     ADL's:  Intact  Sleep: Fair  Appetite:  Fair  Suicidal Ideation:  denies Homicidal Ideation:  Intent:  has thoughts as well as voices asking him to hurt people in general AEB (as evidenced by):  Psychiatric Specialty Exam: Physical Exam  Constitutional: He appears well-developed and well-nourished.  Psychiatric: His speech is normal. His affect is angry (more calmer,more pleasant), labile and inappropriate. He is actively hallucinating. Thought content is delusional. Cognition and memory are normal. He expresses impulsivity and inappropriate judgment. He expresses homicidal ideation. He is inattentive.    Review of Systems  Constitutional: Negative.   HENT: Negative.   Respiratory:  Negative.   Musculoskeletal: Negative.   Psychiatric/Behavioral: Positive for hallucinations. The patient is nervous/anxious.     Blood pressure 118/65, pulse 109, temperature 98.4 F (36.9 C), temperature source Oral, resp. rate 19, height 5\' 3"  (1.6 m), weight 82.555 kg (182 lb).Body mass index is 32.25 kg/(m^2).  General Appearance: Casual  Eye Contact::  Fair  Speech:  Normal Rate  Volume:  Normal  Mood:  Angry and Anxious  Affect:  Labile  Thought Process:  Disorganized and Irrelevant,continues to have thoughts of hurting people   Orientation:  Full (Time, Place, and Person)  Thought Content:  Obsessions  Suicidal Thoughts:  No  Homicidal Thoughts:  Yes.  without intent/plan passive  Memory:  Immediate;   Fair Recent;   Fair Remote;   Fair  Judgement:  Impaired  Insight:  Lacking  Psychomotor Activity:  Normal  Concentration:  Fair  Recall:  [place]  Language: Fair  Akathisia:  No    AIMS (if indicated):   0  Assets:  Communication Skills Desire for Improvement  Sleep:  Number of Hours: 5.5   Musculoskeletal: Strength & Muscle Tone: within normal limits Gait & Station: normal Patient leans: N/A  Current Medications: Current Facility-Administered Medications  Medication Dose Route Frequency Provider Last Rate Last Dose  . acetaminophen (TYLENOL) tablet 650 mg  650 mg Oral Q6H PRN Ursula Alert, MD      . alum & mag hydroxide-simeth (MAALOX/MYLANTA) 200-200-20 MG/5ML suspension 30 mL  30 mL Oral Q4H PRN Ursula Alert, MD      . carbamazepine (TEGRETOL XR) 12 hr tablet 300 mg  300 mg Oral BID Ursula Alert, MD   300 mg at 04/07/14  0921  . hydrOXYzine (ATARAX/VISTARIL) tablet 50 mg  50 mg Oral Q6H PRN Saramma Eappen, MD      . magnesium hydroxide (MILK OF MAGNESIA) suspension 30 mL  30 mL Oral Daily PRN Saramma Eappen, MD      . nicotine (NICODERM CQ - dosed in mg/24 hours) patch 14 mg  14 mg Transdermal Q0600 Ursula Alert, MD   14 mg at  04/07/14 0611  . OLANZapine zydis (ZYPREXA) disintegrating tablet 10 mg  10 mg Oral TID PRN Lorin Hauck      . QUEtiapine (SEROQUEL) tablet 300 mg  300 mg Oral QHS Ursula Alert, MD   300 mg at 04/06/14 2042  . sertraline (ZOLOFT) tablet 50 mg  50 mg Oral Daily Ursula Alert, MD   50 mg at 04/07/14 0240    Lab Results:  No results found for this or any previous visit (from the past 48 hour(s)).  Physical Findings: AIMS: Facial and Oral Movements Muscles of Facial Expression: None, normal Lips and Perioral Area: None, normal Jaw: None, normal Tongue: None, normal,Extremity Movements Upper (arms, wrists, hands, fingers): None, normal Lower (legs, knees, ankles, toes): None, normal, Trunk Movements Neck, shoulders, hips: None, normal, Overall Severity Severity of abnormal movements (highest score from questions above): None, normal Incapacitation due to abnormal movements: None, normal Patient's awareness of abnormal movements (rate only patient's report): No Awareness, Dental Status Current problems with teeth and/or dentures?: No Does patient usually wear dentures?: No  CIWA:  CIWA-Ar Total: 1 COWS:  COWS Total Score: 3  Treatment Plan Summary: Daily contact with patient to assess and evaluate symptoms and progress in treatment Medication management  Plan: Will continue  Zoloft  50 mg daily for obsessive thoughts. Will increase tegretol XR 300 mg po bid for mood swings ,irritability. Will increase  Seroquel to 400 mg po qhs for psychosis/mood swings.Could titrate dose up as needed.  Will continue vistaril prn for anxiety.  Will continue Zyprexa zydis prn for agitation CSW will work on disposition. Patient to participate in therapeutic milieu. Patient to be encouraged to attend groups.  Reviewed labs - uds positive for cocaine ,amphetamines on admission         Medical Decision Making Problem Points:  Established problem, improving (1), Review of last therapy  session (1) and Review of psycho-social stressors (1) Data Points:  Order Aims Assessment (2) Review or order clinical lab tests (1) Review and summation of old records (2) Review of medication regiment & side effects (2) Review of new medications or change in dosage (2)  I certify that inpatient services furnished can reasonably be expected to improve the patient's condition.   Corena Pilgrim, MD 04/07/2014, 10:56 AM

## 2014-04-07 NOTE — [facility] Group Notes (Signed)
[facility] Group Notes:  (Clinical Social Work)  04/07/2014   11:15am-12:00pm  Summary of Progress/Problems:  The main focus of today's process group was to listen to a variety of genres of music and to identify that different types of music provoke different responses.  The patient then was able to identify personally what was soothing for them, as well as energizing. The patient expressed understanding of concepts, as well as knowledge of how each type of music affected him/her and how this can be used at home as a wellness/recovery tool.  The patient interacted with group leader as well as other patients throughout this group, and was upbeat and positive with no complaints.  Type of Therapy:  Music Therapy   Participation Level:  Active  Participation Quality:  Attentive and Sharing  Affect:  Blunted  Cognitive:  Oriented  Insight:  Engaged  Engagement in Therapy:  Engaged  Modes of Intervention:   Activity, Exploration  Selmer Dominion, LCSW 04/07/2014, 12:30pm

## 2014-04-07 NOTE — [facility] Group Notes (Signed)
[facility] Group Notes:  (Nursing/MHT/Case Management/Adjunct)  Date:  04/07/2014  Time:  0920 and 0940 am  Type of Therapy:  Psychoeducational Skills  Participation Level:  None  Participation Quality:  Resistant  Affect:  Irritable  Cognitive:  Alert  Insight:  Lacking  Engagement in Group:  Lacking  Modes of Intervention:  Support  Summary of Progress/Problems:  Justin Chaney 04/07/2014, 1:49 PM

## 2014-04-07 NOTE — Progress Notes (Addendum)
Patient ID: Justin Chaney, male   DOB: 1996/03/05, 18 y.o.   MRN: [id number] D: Patient's mood is irritable; he is restless and pacing the hallway.  He refused to take any medication for anxiety.  He continues to have mood lability, homicidal ideations and delusions.  He denies any SI/HI/AVH.  Patient requested his name card on his door to be changed to "Justin Chaney" which he thinks is the [employer] god.  He has flat and angry affect. A: Continue to monitor medication management and MD orders.  Safety checks completed every 15 minutes per protocol.   R: Monitor patient's behavior and redirect as necessary.

## 2014-04-07 NOTE — Progress Notes (Signed)
Pt did not attend group this evening.  

## 2014-04-08 MED ORDER — QUETIAPINE FUMARATE 400 MG PO TABS
400.0000 mg | ORAL_TABLET | Freq: Every day | ORAL | Status: DC
  Administered 2014-04-08 – 2014-04-09 (×2): 400 mg via ORAL
  Filled 2014-04-08 (×3): qty 1
  Filled 2014-04-08: qty 4

## 2014-04-08 NOTE — Progress Notes (Signed)
Patient ID: Justin Chaney, male   DOB: [birth year], 18 y.o.   MRN: [id number] [facility] MD Progress Note  04/08/2014 11:50 AM Justin Chaney  MRN:  [id number] Subjective: "I have some thoughts but is getting better" ' Objective: Patient seen and his chart was reviewed. Patient reports having some homicidal thoughts to hurt people but reports that even thought they are intrusive ,he is better able to cope with it and able to resist it. Reports AH of hearing people talk ,but reports the voices as not bad and not command. Patient denies suicidal thoughts.  Patient denies any side effects of medications.  Per staff patient had some periods of irritability over the week end.  Diagnosis:   DSM5:  Primary psychiatric diagnosis:  Schizoaffective disorder,bipolar type,multiple episodes ,currently in acute episode   Secondary psychiatric diagnosis:  Unspecified obsessive compulsive and related disorder Intermittent explosive disorder  Stimulant use disorder (amphetamines,cocaine) Tobacco use disorder   Non psychiatric diagnosis:  Asthma (as a child)  Seizure (as a child -last one at the age of 43y)  Hx of Hydrocephalous (s/p vp shunt placement     ADL's:  Intact  Sleep: Fair  Appetite:  Fair  Suicidal Ideation:  denies Homicidal Ideation:  Intent:  has thoughts to hurt people in general AEB (as evidenced by):  Psychiatric Specialty Exam: Physical Exam  Constitutional: He appears well-developed and well-nourished.  Psychiatric: His speech is normal. His affect is angry (more calmer,more pleasant), labile and inappropriate. He is actively hallucinating. Thought content is delusional. Cognition and memory are normal. He expresses impulsivity and inappropriate judgment. He expresses homicidal ideation. He is inattentive.    Review of Systems  Constitutional: Negative.   HENT: Negative.   Respiratory: Negative.   Musculoskeletal: Negative.   Psychiatric/Behavioral: Positive for  hallucinations. The patient is nervous/anxious.     Blood pressure 118/84, pulse 110, temperature 97.3 F (36.3 C), temperature source Oral, resp. rate 16, height 5\' 3"  (1.6 m), weight 82.555 kg (182 lb).Body mass index is 32.25 kg/(m^2).  General Appearance: Casual  Eye Contact::  Fair  Speech:  Normal Rate  Volume:  Normal  Mood:  Angry and Anxious  Affect:  Labile  Thought Process:  Disorganized and Irrelevant,continues to have thoughts of hurting people   Orientation:  Full (Time, Place, and Person)  Thought Content:  Obsessions  Suicidal Thoughts:  No  Homicidal Thoughts:  Yes.  without intent/plan passive  Memory:  Immediate;   Fair Recent;   Fair Remote;   Fair  Judgement:  Impaired  Insight:  Lacking  Psychomotor Activity:  Normal  Concentration:  Fair  Recall:  [place]  Language: Fair  Akathisia:  No    AIMS (if indicated):   0  Assets:  Communication Skills Desire for Improvement  Sleep:  Number of Hours: 6.5   Musculoskeletal: Strength & Muscle Tone: within normal limits Gait & Station: normal Patient leans: N/A  Current Medications: Current Facility-Administered Medications  Medication Dose Route Frequency Provider Last Rate Last Dose  . acetaminophen (TYLENOL) tablet 650 mg  650 mg Oral Q6H PRN Ursula Alert, MD      . alum & mag hydroxide-simeth (MAALOX/MYLANTA) 200-200-20 MG/5ML suspension 30 mL  30 mL Oral Q4H PRN Ursula Alert, MD      . carbamazepine (TEGRETOL XR) 12 hr tablet 300 mg  300 mg Oral BID Ursula Alert, MD   300 mg at 04/08/14 0819  . hydrOXYzine (ATARAX/VISTARIL) tablet 50 mg  50 mg Oral  Q6H PRN Ursula Alert, MD      . magnesium hydroxide (MILK OF MAGNESIA) suspension 30 mL  30 mL Oral Daily PRN Ameilia Rattan, MD      . nicotine (NICODERM CQ - dosed in mg/24 hours) patch 14 mg  14 mg Transdermal Q0600 Ursula Alert, MD   14 mg at 04/07/14 0611  . OLANZapine zydis (ZYPREXA) disintegrating tablet 10 mg  10 mg Oral  TID PRN Mojeed Akintayo      . QUEtiapine (SEROQUEL) tablet 400 mg  400 mg Oral QHS Khyrin Trevathan, MD      . sertraline (ZOLOFT) tablet 50 mg  50 mg Oral Daily Ursula Alert, MD   50 mg at 04/08/14 8768    Lab Results:  No results found for this or any previous visit (from the past 48 hour(s)).  Physical Findings: AIMS: Facial and Oral Movements Muscles of Facial Expression: None, normal Lips and Perioral Area: None, normal Jaw: None, normal Tongue: None, normal,Extremity Movements Upper (arms, wrists, hands, fingers): None, normal Lower (legs, knees, ankles, toes): None, normal, Trunk Movements Neck, shoulders, hips: None, normal, Overall Severity Severity of abnormal movements (highest score from questions above): None, normal Incapacitation due to abnormal movements: None, normal Patient's awareness of abnormal movements (rate only patient's report): No Awareness, Dental Status Current problems with teeth and/or dentures?: No Does patient usually wear dentures?: No  CIWA:  CIWA-Ar Total: 1 COWS:  COWS Total Score: 3  Treatment Plan Summary: Daily contact with patient to assess and evaluate symptoms and progress in treatment Medication management  Plan: Will continue  Zoloft  50 mg daily for obsessive thoughts. Will continue  tegretol XR 300 mg po bid for mood swings ,irritability. Will increase  Seroquel to 400 mg po qhs for psychosis/mood swings.Could titrate dose up as needed.  Will continue vistaril prn for anxiety.  Will continue Zyprexa zydis prn for agitation CSW will work on disposition. Patient to participate in therapeutic milieu. Patient to be encouraged to attend groups.  Reviewed labs - uds positive for cocaine ,amphetamines on admission     Medical Decision Making Problem Points:  Established problem, improving (1), Review of last therapy session (1) and Review of psycho-social stressors (1) Data Points:  Order Aims Assessment (2) Review or order  clinical lab tests (1) Review and summation of old records (2) Review of medication regiment & side effects (2) Review of new medications or change in dosage (2)  I certify that inpatient services furnished can reasonably be expected to improve the patient's condition.   Yerachmiel Spinney, MD 04/08/2014, 11:50 AM

## 2014-04-08 NOTE — [facility] Group Notes (Signed)
[facility] LCSW Aftercare Discharge Planning Group Note   04/08/2014 1:47 PM  Participation Quality:  Engaged  Mood/Affect:  Appropriate  Depression Rating:  denies  Anxiety Rating:  denies  Thoughts of Suicide:  No Will you contract for safety?   NA  Current AVH:  Yes  But not command in nature  "It's getting better."  Plan for Discharge/Comments:  Justin Chaney states he wants to talk about "peace of mind" this AM.  When asked what gives him peace of mind, he replied that he gets it from music.  Went on to say that he does not need a radio, but can hear it in his mind.  Asked him to tell us a song he can hear now, and he named an Training and development officer and song.  Affect brightened considerably when he was talking about his.  When asked about d/c, states he will stay with a friend.  Asked me to 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  "I need to call them to make sure I still have work."  [employer]: bus  Supports: unk  [employer], [place] B
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
## 2014-04-08 NOTE — Progress Notes (Signed)
Patient ID: Justin Chaney, male   DOB: 1995/08/21, 18 y.o.   MRN: [id number] D)   Was sleeping at the beginning of the shift, but came out to the dayroom for a snack and also to the med window for hs meds.  Has been pleasant and cooperative, has been interacting appropriately with select peers before returning to his room.  Hs voiced no c/o's tonight, states voices are still there, seems to be more anxious before he returned to his room. A)  Will continue to monitor for safety, continue POC R)  Safety maintained,

## 2014-04-08 NOTE — Progress Notes (Signed)
Patient ID: Justin Chaney, male   DOB: 11-15-1995, 18 y.o.   MRN: [id number] D: Patient continues to progress well with his treatment.  He endorses auditory commands to kill people.  He reports the AH have decreased and he is able to cope with them.  Patient has been attending some groups.  He is interacting more with staff and his peers.  He denies any depressive symptoms; he rates his depression as a 3.  He reports good sleep and appetite.   A: Continue to monitor medication management and MD orders.  Safety checks completed every 15 minutes per protocol. R: Patient has been receptive to staff.

## 2014-04-09 MED ORDER — SERTRALINE HCL 25 MG PO TABS
75.0000 mg | ORAL_TABLET | Freq: Every day | ORAL | Status: DC
  Administered 2014-04-10: 75 mg via ORAL
  Filled 2014-04-09: qty 3
  Filled 2014-04-09: qty 12
  Filled 2014-04-09: qty 3

## 2014-04-09 NOTE — Progress Notes (Signed)
Adult Psychoeducational Group Note  Date:  04/09/2014 Time:  10:01 PM  Group Topic/Focus:  Wrap-Up Group:   The focus of this group is to help patients review their daily goal of treatment and discuss progress on daily workbooks.  Participation Level:  Active  Participation Quality:  Appropriate  Affect:  Labile  Cognitive:  Appropriate  Insight: Limited  Engagement in Group:  Engaged  Modes of Intervention:  Socialization and Support  Additional Comments:Patient attended and participated in group tonight. He reports having a great day. He talked to a friend about things, eat well, sleep a lot, took his medication, attended his groups and went outside with the group.   Salley [facility] 04/09/2014, 10:01 PM

## 2014-04-09 NOTE — Tx Team (Signed)
  Interdisciplinary Treatment Plan Update   Date Reviewed:  04/09/2014  Time Reviewed:  9:43 AM  Progress in Treatment:   Attending groups: Yes Participating in groups: Yes Taking medication as prescribed: Yes  Tolerating medication: Yes Family/Significant other contact made: Yes  Patient understands diagnosis: Yes  Discussing patient identified problems/goals with staff: Yes  See initial care plan Medical problems stabilized or resolved: Yes Denies suicidal/homicidal ideation: Yes  In tx team Patient has not harmed self or others: Yes  For review of initial/current patient goals, please see plan of care.  Estimated Length of Stay:  Likely d/c tomorrow  Reason for Continuation of Hospitalization:   New Problems/Goals identified:  N/A  Discharge Plan or Barriers:   stay with friend, follow up outpt  Additional Comments:  Attendees:  Signature: Steva Colder, MD 04/09/2014 9:43 AM   Signature: Ripley Fraise, LCSW 04/09/2014 9:43 AM  Signature: Elmarie Shiley, NP 04/09/2014 9:43 AM  Signature: Mayra Neer, RN 04/09/2014 9:43 AM  Signature: Darrol Angel, RN 04/09/2014 9:43 AM  Signature:  04/09/2014 9:43 AM  Signature:   04/09/2014 9:43 AM  Signature:    Signature:    Signature:    Signature:    Signature:    Signature:      Scribe for Treatment Team:   Ripley Fraise, LCSW  04/09/2014 9:43 AM

## 2014-04-09 NOTE — Progress Notes (Signed)
The focus of this group is to educate the patient on the purpose and policies of crisis stabilization and provide a format to answer questions about their admission.  The group details unit policies and expectations of patients while admitted. Patient did not attend this group.

## 2014-04-09 NOTE — Progress Notes (Signed)
D: Pt is denying any current SI/HI/AVH. Pt reports that his desire to torture others has decreased. Pt was pleasant and cooperative this evening. Pt was active within the milieu. Pt approprietly interacts with others. Pt was informed of the rationale behind his Seroquel increase. Pt was accepting of the rationale.  A: Writer administered scheduled medications to pt. Continued support and availability as needed was extended to this pt. Staff continue to monitor pt with q69min checks.  R: No adverse drug reactions noted. Pt receptive to treatment. Pt remains safe at this time.

## 2014-04-09 NOTE — Progress Notes (Signed)
D: Patient denies SI/HI and A/V hallucinations; patient reports that he slept well but still very sleepy this morning  A: Monitored q 15 minutes; patient encouraged to attend groups; patient educated about medications; patient given medications per physician orders; patient encouraged to express feelings and/or concerns  R: Patient is animated and appropriate to circumstances at this time; patient is cooperative and attending some groups; patient is taking medications as precribed

## 2014-04-09 NOTE — [facility] Group Notes (Signed)
[facility] LCSW Group Therapy  04/09/2014 1:15 pm  Type of Therapy: Process Group Therapy  Participation Level:  Active  Participation Quality:  Appropriate  Affect:  Flat  Cognitive:  Oriented  Insight:  Improving  Engagement in Group:  Limited  Engagement in Therapy:  Limited  Modes of Intervention:  Activity, Clarification, Education, Problem-solving and Support  Summary of Progress/Problems: Today's group addressed the issue of overcoming obstacles.  Patients were asked to identify their biggest obstacle post d/c that stands in the way of their on-going success, and then problem solve as to how to manage this. Justin Chaney identified stable housing, drugs and forgiving his family as his main obstacles.  He identifed using drugs to escape as the biggest obstacle, but when I asked for feedback from others in the group who have done the same, no one stepped up. He admitted that he has been unable to put together any sober time of significance, and that he goes through his money quickly.  I suggested NA meetings and getting a sponsor.  Justin Chaney 04/09/2014   12:32 PM

## 2014-04-09 NOTE — Progress Notes (Signed)
Pt reports that he did not want the info in the previous note discussed with staff. Pt does not want this information to follow around as part of his history.

## 2014-04-09 NOTE — Progress Notes (Signed)
Patient ID: Justin Chaney, male   DOB: 1995/12/26, 18 y.o.   MRN: [id number] [facility] MD Progress Note  04/09/2014 11:34 AM Justin Chaney  MRN:  [id number] Subjective: "I am OK. ' Objective: Patient seen and his chart was reviewed. Patient reports his homicidal thoughts to hurt people are improving. He reports them as very reduced and he is able to cope with them better . He continues to endorse AH ,but they are improving . Patient denies suicidal thoughts.  Patient feels his seroquel dose may be a bit too much for him, but would like to give it more time.  Per staff patient is more cooperative ,compliant with treatment.  Diagnosis:   DSM5:  Primary psychiatric diagnosis:  Schizoaffective disorder,bipolar type,multiple episodes ,currently in acute episode   Secondary psychiatric diagnosis:  Unspecified obsessive compulsive and related disorder Intermittent explosive disorder  Stimulant use disorder (amphetamines,cocaine) Tobacco use disorder   Non psychiatric diagnosis:  Asthma (as a child)  Seizure (as a child -last one at the age of 31y)  Hx of Hydrocephalous (s/p vp shunt placement     ADL's:  Intact  Sleep: Good  Appetite:  Fair  Suicidal Ideation:  denies Homicidal Ideation:  Intent:  has thoughts to hurt people in general AEB (as evidenced by):  Psychiatric Specialty Exam: Physical Exam  Constitutional: He appears well-developed and well-nourished.  Psychiatric: His speech is normal. His affect is angry (more calmer,more pleasant), labile and inappropriate. He is actively hallucinating. Thought content is delusional. Cognition and memory are normal. He expresses impulsivity and inappropriate judgment. He expresses homicidal ideation. He is inattentive.    Review of Systems  Constitutional: Negative.   HENT: Negative.   Respiratory: Negative.   Musculoskeletal: Negative.   Psychiatric/Behavioral: Positive for hallucinations. The patient is nervous/anxious.      Blood pressure 119/51, pulse 105, temperature 98.1 F (36.7 C), temperature source Oral, resp. rate 16, height 5\' 3"  (1.6 m), weight 82.555 kg (182 lb).Body mass index is 32.25 kg/(m^2).  General Appearance: Casual  Eye Contact::  Fair  Speech:  Normal Rate  Volume:  Normal  Mood:  Anxious  Affect:  Labile  Thought Process:  Disorganized and Irrelevant,continues to have thoughts of hurting people but decreased.  Orientation:  Full (Time, Place, and Person)  Thought Content:  Obsessions  Suicidal Thoughts:  No  Homicidal Thoughts:  Yes.  without intent/plan passive (able to resist it better)  Memory:  Immediate;   Fair Recent;   Fair Remote;   Fair  Judgement:  Impaired  Insight:  Lacking  Psychomotor Activity:  Normal  Concentration:  Fair  Recall:  [employer] of Knowledge:Fair  Language: Fair  Akathisia:  No    AIMS (if indicated):   0  Assets:  Communication Skills Desire for Improvement  Sleep:  Number of Hours: 6.25   Musculoskeletal: Strength & Muscle Tone: within normal limits Gait & Station: normal Patient leans: N/A  Current Medications: Current Facility-Administered Medications  Medication Dose Route Frequency Provider Last Rate Last Dose  . acetaminophen (TYLENOL) tablet 650 mg  650 mg Oral Q6H PRN Ursula Alert, MD      . alum & mag hydroxide-simeth (MAALOX/MYLANTA) 200-200-20 MG/5ML suspension 30 mL  30 mL Oral Q4H PRN Ursula Alert, MD      . carbamazepine (TEGRETOL XR) 12 hr tablet 300 mg  300 mg Oral BID Ursula Alert, MD   300 mg at 04/09/14 0845  . hydrOXYzine (ATARAX/VISTARIL) tablet 50 mg  50 mg Oral  Q6H PRN Ursula Alert, MD      . magnesium hydroxide (MILK OF MAGNESIA) suspension 30 mL  30 mL Oral Daily PRN Rebekah Zackery, MD      . nicotine (NICODERM CQ - dosed in mg/24 hours) patch 14 mg  14 mg Transdermal Q0600 Ursula Alert, MD   14 mg at 04/07/14 0611  . OLANZapine zydis (ZYPREXA) disintegrating tablet 10 mg  10 mg Oral TID PRN Mojeed  Akintayo      . QUEtiapine (SEROQUEL) tablet 400 mg  400 mg Oral QHS Ursula Alert, MD   400 mg at 04/08/14 2132  . [START ON 04/10/2014] sertraline (ZOLOFT) tablet 75 mg  75 mg Oral Daily Ursula Alert, MD        Lab Results:  No results found for this or any previous visit (from the past 48 hour(s)).  Physical Findings: AIMS: Facial and Oral Movements Muscles of Facial Expression: None, normal Lips and Perioral Area: None, normal Jaw: None, normal Tongue: None, normal,Extremity Movements Upper (arms, wrists, hands, fingers): None, normal Lower (legs, knees, ankles, toes): None, normal, Trunk Movements Neck, shoulders, hips: None, normal, Overall Severity Severity of abnormal movements (highest score from questions above): None, normal Incapacitation due to abnormal movements: None, normal Patient's awareness of abnormal movements (rate only patient's report): No Awareness, Dental Status Current problems with teeth and/or dentures?: No Does patient usually wear dentures?: No  CIWA:  CIWA-Ar Total: 1 COWS:  COWS Total Score: 3  Treatment Plan Summary: Daily contact with patient to assess and evaluate symptoms and progress in treatment Medication management  Plan: Will increase  Zoloft to 75  mg daily for obsessive thoughts. Will continue  tegretol XR 300 mg po bid for mood swings ,irritability. Will continue  Seroquel  400 mg po qhs for psychosis/mood swings.Could titrate dose up as needed.  Will continue vistaril prn for anxiety.  Will continue Zyprexa zydis prn for agitation CSW will work on disposition. Patient to participate in therapeutic milieu. Patient to be encouraged to attend groups.      Medical Decision Making Problem Points:  Established problem, improving (1), Review of last therapy session (1) and Review of psycho-social stressors (1) Data Points:  Order Aims Assessment (2) Review or order clinical lab tests (1) Review and summation of old records  (2) Review of medication regiment & side effects (2)  I certify that inpatient services furnished can reasonably be expected to improve the patient's condition.   Erroll Wilbourne, MD 04/09/2014, 11:34 AM

## 2014-04-10 MED ORDER — SERTRALINE HCL 25 MG PO TABS: 75.0000 mg | ORAL_TABLET | Freq: Every day | ORAL | Status: DC

## 2014-04-10 MED ORDER — HYDROXYZINE HCL 50 MG PO TABS
50.0000 mg | ORAL_TABLET | Freq: Three times a day (TID) | ORAL | Status: DC | PRN
  Filled 2014-04-10: qty 10

## 2014-04-10 MED ORDER — HYDROXYZINE HCL 50 MG PO TABS: ORAL_TABLET | ORAL | Status: DC

## 2014-04-10 MED ORDER — CARBAMAZEPINE ER 100 MG PO TB12
300.0000 mg | ORAL_TABLET | Freq: Two times a day (BID) | ORAL | Status: DC
  Filled 2014-04-10 (×2): qty 24

## 2014-04-10 MED ORDER — QUETIAPINE FUMARATE 400 MG PO TABS: 400.0000 mg | ORAL_TABLET | Freq: Every day | ORAL | Status: DC

## 2014-04-10 MED ORDER — CARBAMAZEPINE ER 100 MG PO TB12: 300.0000 mg | ORAL_TABLET | Freq: Two times a day (BID) | ORAL | Status: DC

## 2014-04-10 NOTE — Discharge Summary (Signed)
Physician Discharge Summary Note  Patient:  Justin Chaney is an 18 y.o., male MRN:  [id number] DOB:  06/20/1996 Patient phone:  [phone] (home)  Patient address:   [place] [address],  Total Time spent with patient: Greater than 35 min  Date of Admission:  04/01/2014 Date of Discharge: 04/10/2014  Reason for Admission:  Mood stabilization  Discharge Diagnoses: Active Problems:   Psychosis   Psychiatric Specialty Exam: Physical Exam  Psychiatric: His mood appears not anxious. His affect is not angry, not blunt, not labile and not inappropriate. His speech is not rapid and/or pressured, not delayed, not tangential and not slurred. He is not agitated, not aggressive, not hyperactive, not slowed, not withdrawn, not actively hallucinating and not combative. Thought content is not paranoid and not delusional. Cognition and memory are not impaired. He does not express impulsivity or inappropriate judgment. He does not exhibit a depressed mood. He expresses no homicidal and no suicidal ideation. He expresses no suicidal plans and no homicidal plans. He is communicative. He exhibits normal recent memory and normal remote memory. He is attentive.    Review of Systems  Constitutional: Negative.   HENT: Negative.   Eyes: Negative.   Respiratory: Negative.   Gastrointestinal: Negative.   Genitourinary: Negative.   Musculoskeletal: Negative.   Skin: Negative.   Neurological: Negative.   Endo/Heme/Allergies: Negative.   Psychiatric/Behavioral: Positive for depression (stable), hallucinations (Hx of ) and substance abuse (Polysubstance abuse). Negative for suicidal ideas and memory loss. The patient has insomnia (stable). The patient is not nervous/anxious.     Blood pressure 104/59, pulse 108, temperature 97.4 F (36.3 C), temperature source Oral, resp. rate 18, height 5\' 3"  (1.6 m), weight 82.555 kg (182 lb).Body mass index is 32.25 kg/(m^2).   General  Appearance: Casual   Eye Contact:: Fair   Speech: Normal Rate   Volume: Normal   Mood: Anxious   Affect: Appropriate   Thought Process: Goal Directed   Orientation: Full (Time, Place, and Person)   Thought Content: denies AH/VH,Intrusive thoughts   Suicidal Thoughts: No   Homicidal Thoughts: No   Memory: Immediate; Fair  Recent; Fair  Remote; Fair   Judgement: Fair   Insight: Fair   Psychomotor Activity: Normal   Concentration: Fair   Recall: [employer] of Knowledge:Fair   Language: Good   Akathisia: No     AIMS (if indicated): 0   Assets: Communication Skills  Desire for Improvement   Sleep: Number of Hours: 6.75    Past Psychiatric History: Diagnosis:  Psychotic disorder  Hospitalizations:  [facility]  Outpatient Care:  [facility]  Substance Abuse Care:  NA  Self-Mutilation:  NA  Suicidal Attempts:  NA  Violent Behaviors:  NA   Musculoskeletal: Strength & Muscle Tone: within normal limits Gait & Station: normal Patient leans: N/A  DSM5: Primary psychiatric diagnosis:  Schizoaffective disorder,bipolar type,multiple episodes ,currently in (acute episode -resolved)  Secondary psychiatric diagnosis:  Unspecified obsessive compulsive and related disorder  Intermittent explosive disorder  Stimulant use disorder (amphetamines,cocaine)  Tobacco use disorder   Non psychiatric diagnosis:  Asthma (as a child)  Seizure (as a child -last one at the age of 61y)  Hx of Hydrocephalous (s/p vp shunt placement     Level of Care:  OP  Hospital Course:  Mr.Samarin is a 18 year old AAM, who presented as a walk-in to [facility] for psychosis. Patient reports that he has been hearing voices since the past 3  days or so asking him to torture and kill people. Patient reports that he has been having these thoughts for the past several years and started at the age of 67.  He reports that when he hears these voices, he does not do anything and it goes away after a while. He  reports he never listens to the voices and just leaves him angry and irritable.  After admission assessment/evaluation, It was determined based on his symptoms that Maysin will need medication management to re-stabilize his mood.  He was started on  tegretol 100 mg twice daily for mood stability, Hydroxyzine 50 mg for agitation/anxiety, Seroquel 400 mg at bedtime for mood control and psychosis  . Patient was started on Zoloft 25 mg for intrusive thoughts that he had and he was quiet obsessed on it . It was likely that patient may have some unspecified obsessive compulsive disorder and hence Zoloft was titraed up to 75 mg. He did respond to it and his intrusive thoughts reduced.  Furthermore, Mr Devery  attended group counseling sessions being offered in this unit to learn coping skills.  He presented no other medical issues that required treatment.  However, he tolerated his treatment regimen without any adverse effect.  Upon discharge, patient was encouraged to remain adherent with medications and to keep his follow up appointments at [place], [place] to maintain mood stability. He was given a 4 day supply of discharge medications  from the pharmacy.  Consults:  psychiatry  Significant Diagnostic Studies:  labs: CBC with diff, CMP, UDS, Toxicology test, UA, tegetrol level  Discharge Vitals:   Blood pressure 104/59, pulse 108, temperature 97.4 F (36.3 C), temperature source Oral, resp. rate 18, height 5\' 3"  (1.6 m), weight 82.555 kg (182 lb). Body mass index is 32.25 kg/(m^2). Lab Results:   No results found for this or any previous visit (from the past 72 hour(s)).  Physical Findings: AIMS: Facial and Oral Movements Muscles of Facial Expression: None, normal Lips and Perioral Area: None, normal Jaw: None, normal Tongue: None, normal,Extremity Movements Upper (arms, wrists, hands, fingers): None, normal Lower (legs, knees, ankles, toes): None, normal, Trunk  Movements Neck, shoulders, hips: None, normal, Overall Severity Severity of abnormal movements (highest score from questions above): None, normal Incapacitation due to abnormal movements: None, normal Patient's awareness of abnormal movements (rate only patient's report): No Awareness, Dental Status Current problems with teeth and/or dentures?: No Does patient usually wear dentures?: No  CIWA:  CIWA-Ar Total: 1 COWS:  COWS Total Score: 3  Psychiatric Specialty Exam: See Psychiatric Specialty Exam and Suicide Risk Assessment completed by Attending Physician prior to discharge.  Discharge destination:  Home  Is patient on multiple antipsychotic therapies at discharge:  No   Has Patient had three or more failed trials of antipsychotic monotherapy by history:  No  Recommended Plan for Multiple Antipsychotic Therapies: NA     Medication List    STOP taking these medications       ABILIFY MAINTENA 400 MG Susr  Generic drug:  ARIPiprazole      TAKE these medications     Indication   carbamazepine 100 MG 12 hr tablet  Commonly known as:  TEGRETOL XR  Take 3 tablets (300 mg total) by mouth 2 (two) times daily. For mood control   Indication:  Mood control     hydrOXYzine 50 MG tablet  Commonly known as:  ATARAX/VISTARIL  Take one tablet (50 mg) three times daily as  needed: For anxiety   Indication:  Tension, Anxiety     QUEtiapine 400 MG tablet  Commonly known as:  SEROQUEL  Take 1 tablet (400 mg total) by mouth at bedtime. For mood control.   Indication:  Mood control     sertraline 25 MG tablet  Commonly known as:  ZOLOFT  Take 3 tablets (75 mg total) by mouth daily. For depression.   Indication:  For depression           Follow-up Information   Follow up with [facility] On 04/24/2014. (for medication management with Dr. Rosine Door.  )    Contact information:   [address]  [place], [place] [address] [phone]      Follow-up recommendations:   Other:  Activities; as directed by primary physician  Comments:  1.  Take all your medications as prescribed.              2.  Report any adverse side effects to outpatient provider.                       3.  Patient instructed to not use alcohol or illegal drugs while on prescription medicines.            4.  In the event of worsening symptoms, instructed patient to call 911, the crisis hotline or go to nearest emergency room for evaluation of symptoms.  Total Discharge Time:  Greater than 30 minutes.  Signed: Roswell Nickel May. NP 04/10/2014, 9:31 AM  Patient was seen face to face for psychiatric evaluation, suicide risk assessment and case discussed with treatment team and NP and made appropriate disposition plans. Reviewed the information documented and agree with the treatment plan.    Ursula Alert ,MD Attending [facility]

## 2014-04-10 NOTE — Progress Notes (Signed)
D:  Pt +ve HI/ AH- pt contracts for safety. Pt denies SI/VH. Pt is pleasant and cooperative. Pt was irritated earlier and requested something to calm him down, pt was given Zyprexa( 1951) and it seemed to calm him down.   A: Pt was offered support and encouragement. Pt was given scheduled medications. Pt was encourage to attend groups. Q 15 minute checks were done for safety.   R:Pt attends groups and interacts well with peers and staff. Pt is taking medication.Pt receptive to treatment and safety maintained on unit. Pt stated he was not as agitated as he was earlier.

## 2014-04-10 NOTE — BHH Suicide Risk Assessment (Signed)
[facility] INPATIENT:  Family/Significant Other Suicide Prevention Education  Suicide Prevention Education:  Education Completed; No one has been identified by the patient as the family member/significant other with whom the patient will be residing, and identified as the person(s) who will aid the patient in the event of a mental health crisis (suicidal ideations/suicide attempt).  With written consent from the patient, the family member/significant other has been provided the following suicide prevention education, prior to the and/or following the discharge of the patient.  The suicide prevention education provided includes the following:  Suicide risk factors  Suicide prevention and interventions  National Suicide Hotline telephone number  [facility] assessment telephone number  [facility] [place] and/or Residential Mobile Crisis Unit telephone number  Request made of family/significant other to:  Remove weapons (e.g., guns, rifles, knives), all items previously/currently identified as safety concern.    Remove drugs/medications (over-the-counter, prescriptions, illicit drugs), all items previously/currently identified as a safety concern.  The family member/significant other verbalizes understanding of the suicide prevention education information provided.  The family member/significant other agrees to remove the items of safety concern listed above. The patient did not endorse SI at the time of admission, nor did the patient c/o SI during the stay here.  SPE not required.   Roque Lias B 04/10/2014, 12:04 PM

## 2014-04-10 NOTE — Progress Notes (Signed)
Patient ID: Justin Chaney, male   DOB: Dec 10, 1995, 18 y.o.   MRN: [id number] Patient discharged per self inventory; patient denies SI/HI and A/V hallucinations; patient received samples, prescriptions, bus pass, and copy of AVS after it was reviewed; patient had no other questions or concerns at this time; patient verbalized and signed that all belongings are returned; patient left the unit ambulatory

## 2014-04-10 NOTE — BHH Suicide Risk Assessment (Signed)
Demographic Factors:  Male and Low socioeconomic status  Total Time spent with patient: 20 minutes  Psychiatric Specialty Exam: Physical Exam  Constitutional: He is oriented to person, place, and time. He appears well-developed and well-nourished.  HENT:  Head: Normocephalic and atraumatic.  Eyes: Conjunctivae are normal. Pupils are equal, round, and reactive to light.  Neck: Normal range of motion.  Cardiovascular: Normal rate.   Respiratory: Effort normal.  Musculoskeletal: Normal range of motion.  Neurological: He is alert and oriented to person, place, and time.  Skin: Skin is warm.  Psychiatric: He has a normal mood and affect. His speech is normal and behavior is normal. Judgment normal. Cognition and memory are normal. He expresses no homicidal and no suicidal ideation.    Review of Systems  Constitutional: Negative.   HENT: Negative.   Eyes: Negative.   Respiratory: Negative.   Cardiovascular: Negative.   Gastrointestinal: Negative.   Genitourinary: Negative.   Musculoskeletal: Negative.   Skin: Negative.   Neurological: Negative.   Psychiatric/Behavioral: Negative for suicidal ideas and hallucinations. The patient is nervous/anxious. The patient does not have insomnia.     Blood pressure 104/59, pulse 108, temperature 97.4 F (36.3 C), temperature source Oral, resp. rate 18, height 5\' 3"  (1.6 m), weight 82.555 kg (182 lb).Body mass index is 32.25 kg/(m^2).  General Appearance: Casual  Eye Contact::  Fair  Speech:  Normal Rate  Volume:  Normal  Mood:  Anxious  Affect:  Appropriate  Thought Process:  Goal Directed  Orientation:  Full (Time, Place, and Person)  Thought Content:  denies AH/VH,Intrusive thoughts   Suicidal Thoughts:  No  Homicidal Thoughts:  No  Memory:  Immediate;   Fair Recent;   Fair Remote;   Fair  Judgement:  Fair  Insight:  Fair  Psychomotor Activity:  Normal  Concentration:  Fair  Recall:  [employer] of Knowledge:Fair  Language:  Good  Akathisia:  No    AIMS (if indicated):   0  Assets:  Communication Skills Desire for Improvement  Sleep:  Number of Hours: 6.75    Musculoskeletal: Strength & Muscle Tone: within normal limits Gait & Station: normal Patient leans: N/A   Mental Status Per Nursing Assessment::   On Admission:  Thoughts of violence towards others  Current Mental Status by Physician: denies thoughts about hurting people ,denies AH/VH/SI  Loss Factors: Financial problems/change in socioeconomic status  Historical Factors: Impulsivity  Risk Reduction Factors:   Living with another person, especially a relative and Positive social support  Continued Clinical Symptoms:  Previous Psychiatric Diagnoses and Treatments  Cognitive Features That Contribute To Risk:  Polarized thinking    Suicide Risk:  Minimal: No identifiable suicidal ideation.  Patients presenting with no risk factors but with morbid ruminations; may be classified as minimal risk based on the severity of the depressive symptoms  Discharge Diagnoses:  DSM 5 Primary psychiatric diagnosis:  Schizoaffective disorder,bipolar type,multiple episodes ,currently in acute episode -resolved)  Secondary psychiatric diagnosis:  Unspecified obsessive compulsive and related disorder  Intermittent explosive disorder  Stimulant use disorder (amphetamines,cocaine)  Tobacco use disorder   Non psychiatric diagnosis:  Asthma (as a child)  Seizure (as a child -last one at the age of 40y)  Hx of Hydrocephalous (s/p vp shunt placement      Past Medical History  Diagnosis Date  . Asthma   . Seizures   . Brain ventricular shunt obstruction     hydrochelpis  . Depression   . Bipolar  disorder     Plan Of Care/Follow-up recommendations:  Activity:  No restrictions  Is patient on multiple antipsychotic therapies at discharge:  No   Has Patient had three or more failed trials of antipsychotic monotherapy by history:   No  Recommended Plan for Multiple Antipsychotic Therapies: NA    Jacere Pangborn MD 04/10/2014, 9:22 AM

## 2014-04-10 NOTE — Progress Notes (Signed)
[facility] Adult Case Management Discharge Plan :  Will you be returning to the same living situation after discharge: Yes,  stay with friend At discharge, do you have transportation home?:Yes,  bus pass Do you have the ability to pay for your medications:Yes,  insurance  Release of information consent forms completed and in the chart;  Patient's signature needed at discharge.  Patient to Follow up at: Follow-up Information   Follow up with [facility] On 04/24/2014. (for medication management with Dr. Rosine Door.  )    Contact information:   [address]  [place], [place] [address] [phone]      Patient denies SI/HI:   Yes,  yes    Safety Planning and Suicide Prevention discussed:  Yes,  yes  [name] 04/10/2014, 11:41 AM

## 2014-04-15 NOTE — Progress Notes (Signed)
Patient Discharge Instructions:  After Visit Summary (AVS):   Faxed to:  04/15/14 Discharge Summary Note:   Faxed to:  04/15/14 Psychiatric Admission Assessment Note:   Faxed to:  04/15/14 Suicide Risk Assessment - Discharge Assessment:   Faxed to:  04/15/14 Faxed/Sent to the Next Level Care provider:  04/15/14 Faxed to [facility] [place], 04/15/2014, 3:31 PM

## 2014-05-25 ENCOUNTER — Emergency Department (HOSPITAL_COMMUNITY): Payer: BC Managed Care – PPO

## 2014-05-25 ENCOUNTER — Other Ambulatory Visit: Payer: Self-pay

## 2014-05-25 ENCOUNTER — Emergency Department (HOSPITAL_COMMUNITY)
Admission: EM | Admit: 2014-05-25 | Discharge: 2014-05-27 | Disposition: A | Discharge status: Home / Self Care | Payer: BC Managed Care – PPO | Attending: Emergency Medicine | Admitting: Emergency Medicine

## 2014-05-25 ENCOUNTER — Encounter (HOSPITAL_COMMUNITY): Payer: Self-pay | Admitting: Emergency Medicine

## 2014-05-25 DIAGNOSIS — F1510 Other stimulant abuse, uncomplicated: Secondary | ICD-10-CM | POA: Diagnosis present

## 2014-05-25 DIAGNOSIS — Z720 Tobacco use: Secondary | ICD-10-CM | POA: Insufficient documentation

## 2014-05-25 DIAGNOSIS — R4182 Altered mental status, unspecified: Secondary | ICD-10-CM

## 2014-05-25 DIAGNOSIS — Z79899 Other long term (current) drug therapy: Secondary | ICD-10-CM | POA: Insufficient documentation

## 2014-05-25 DIAGNOSIS — R45851 Suicidal ideations: Secondary | ICD-10-CM

## 2014-05-25 DIAGNOSIS — F319 Bipolar disorder, unspecified: Secondary | ICD-10-CM | POA: Diagnosis not present

## 2014-05-25 DIAGNOSIS — J45909 Unspecified asthma, uncomplicated: Secondary | ICD-10-CM | POA: Insufficient documentation

## 2014-05-25 DIAGNOSIS — Z880 Allergy status to penicillin: Secondary | ICD-10-CM | POA: Insufficient documentation

## 2014-05-25 DIAGNOSIS — F1910 Other psychoactive substance abuse, uncomplicated: Secondary | ICD-10-CM

## 2014-05-25 DIAGNOSIS — F1994 Other psychoactive substance use, unspecified with psychoactive substance-induced mood disorder: Secondary | ICD-10-CM | POA: Diagnosis present

## 2014-05-25 DIAGNOSIS — F209 Schizophrenia, unspecified: Secondary | ICD-10-CM | POA: Diagnosis present

## 2014-05-25 DIAGNOSIS — R443 Hallucinations, unspecified: Secondary | ICD-10-CM | POA: Diagnosis present

## 2014-05-25 LAB — COMPREHENSIVE METABOLIC PANEL
ALT: 81 U/L — ABNORMAL HIGH (ref 0–53)
AST: 50 U/L — ABNORMAL HIGH (ref 0–37)
Albumin: 3.7 g/dL (ref 3.5–5.2)
Alkaline Phosphatase: 73 U/L (ref 39–117)
Anion gap: 13 (ref 5–15)
BUN: 9 mg/dL (ref 6–23)
CO2: 23 mEq/L (ref 19–32)
Calcium: 8.8 mg/dL (ref 8.4–10.5)
Chloride: 107 mEq/L (ref 96–112)
Creatinine, Ser: 0.86 mg/dL (ref 0.50–1.35)
GFR calc Af Amer: >90 mL/min (ref >90)
GFR calc non Af Amer: >90 mL/min (ref >90)
Glucose, Bld: 109 mg/dL — ABNORMAL HIGH (ref 70–99)
Potassium: 3.6 mEq/L — ABNORMAL LOW (ref 3.7–5.3)
Sodium: 143 mEq/L (ref 137–147)
Total Bilirubin: 0.4 mg/dL (ref 0.3–1.2)
Total Protein: 6.7 g/dL (ref 6.0–8.3)

## 2014-05-25 LAB — RAPID URINE DRUG SCREEN, HOSP PERFORMED
Amphetamines: POSITIVE — AB
Barbiturates: NOT DETECTED
Benzodiazepines: NOT DETECTED
Cocaine: NOT DETECTED
Opiates: NOT DETECTED
Tetrahydrocannabinol: NOT DETECTED

## 2014-05-25 LAB — CBC
HCT: 39.9 % (ref 39.0–52.0)
Hemoglobin: 13.3 g/dL (ref 13.0–17.0)
MCH: 28.5 pg (ref 26.0–34.0)
MCHC: 33.3 g/dL (ref 30.0–36.0)
MCV: 85.4 fL (ref 78.0–100.0)
Platelets: 174 10*3/uL (ref 150–400)
RBC: 4.67 MIL/uL (ref 4.22–5.81)
RDW: 12.5 % (ref 11.5–15.5)
WBC: 7.9 10*3/uL (ref 4.0–10.5)

## 2014-05-25 LAB — ACETAMINOPHEN LEVEL: Acetaminophen (Tylenol), Serum: <15 ug/mL (ref 10–30)

## 2014-05-25 LAB — ETHANOL: Alcohol, Ethyl (B): <11 mg/dL (ref 0–11)

## 2014-05-25 LAB — SALICYLATE LEVEL: Salicylate Lvl: <2 mg/dL — ABNORMAL LOW (ref 2.8–20.0)

## 2014-05-25 MED ORDER — LORAZEPAM 2 MG/ML IJ SOLN
1.0000 mg | Freq: Once | INTRAMUSCULAR | Status: AC
  Administered 2014-05-25: 1 mg via INTRAVENOUS
  Filled 2014-05-25: qty 1

## 2014-05-25 MED ORDER — AMMONIA AROMATIC IN INHA
0.3000 mL | Freq: Once | RESPIRATORY_TRACT | Status: AC
  Administered 2014-05-25: 0.3 mL via RESPIRATORY_TRACT
  Filled 2014-05-25: qty 10

## 2014-05-25 MED ORDER — SODIUM CHLORIDE 0.9 % IV SOLN
1000.0000 mL | Freq: Once | INTRAVENOUS | Status: AC
  Administered 2014-05-25: 1000 mL via INTRAVENOUS

## 2014-05-25 NOTE — ED Notes (Signed)
EMS reports pt was knocking on doors and windows in neighborhood, no one knew him. Pt had a cup with tin foil and razor blade, Sweaty, states "the [place] is making me sleepy" Pt agitated at times

## 2014-05-25 NOTE — ED Provider Notes (Addendum)
CSN: [id number]     Arrival date & time 05/25/14  1750 History   First MD Initiated Contact with Patient 05/25/14 1757     Chief Complaint  Patient presents with  . Hallucinations  . Medical Clearance     (Consider location/radiation/quality/duration/timing/severity/associated sxs/prior Treatment) Patient is a 18 y.o. male presenting with altered mental status. The history is provided by the EMS personnel.  Altered Mental Status Presenting symptoms: behavior changes and combativeness   Severity:  Severe Most recent episode:  Today Episode history:  Single Timing:  Constant Progression:  Unchanged Chronicity: unknown chronicity. Context: drug use   Associated symptoms: slurred speech (garbled speech)   Associated symptoms: no difficulty breathing, no fever, no hallucinations, no suicidal behavior and no vomiting     Past Medical History  Diagnosis Date  . Asthma   . Seizures   . Brain ventricular shunt obstruction     hydrochelpis  . Depression   . Bipolar disorder    Past Surgical History  Procedure Laterality Date  . Tonsillectomy    . Ventriculo-peritoneal shunt placement / laparoscopic insertion peritoneal catheter     No family history on file. History  Substance Use Topics  . Smoking status: Current Every Day Smoker -- 1.00 packs/day    Types: Cigarettes  . Smokeless tobacco: Not on file  . Alcohol Use: 7.2 oz/week    12 Cans of beer per week     Comment: 4-8 40 oz.on the weekend    Review of Systems  Constitutional: Negative for fever.  Gastrointestinal: Negative for vomiting.  Psychiatric/Behavioral: Negative for hallucinations.  All other systems reviewed and are negative.     Allergies  Amoxicillin and Penicillins  Home Medications   Prior to Admission medications   Medication Sig Start Date End Date Taking? Authorizing Provider  carbamazepine (TEGRETOL XR) 100 MG 12 hr tablet Take 3 tablets (300 mg total) by mouth 2 (two) times daily. For  mood control 04/10/14   Janett Labella, NP  hydrOXYzine (ATARAX/VISTARIL) 50 MG tablet Take one tablet (50 mg) three times daily as needed: For anxiety 04/10/14   Janett Labella, NP  QUEtiapine (SEROQUEL) 400 MG tablet Take 1 tablet (400 mg total) by mouth at bedtime. For mood control. 04/10/14   [place], NP  sertraline (ZOLOFT) 25 MG tablet Take 3 tablets (75 mg total) by mouth daily. For depression. 04/10/14   Freda Munro May Agustin, NP   BP 121/73  Pulse 139  Temp(Src) 98.3 F (36.8 C) (Oral)  Resp 18  SpO2 97% Physical Exam  Nursing note and vitals reviewed. Constitutional: He appears well-developed and well-nourished. No distress.  HENT:  Head: Normocephalic and atraumatic.  Mouth/Throat: Oropharynx is clear and moist. No oropharyngeal exudate.  Eyes: EOM are normal. Pupils are equal, round, and reactive to light.  Neck: Normal range of motion. Neck supple.  Cardiovascular: Normal rate and regular rhythm.  Exam reveals no friction rub.   No murmur heard. Pulmonary/Chest: Effort normal and breath sounds normal. No respiratory distress. He has no wheezes. He has no rales.  Abdominal: Soft. He exhibits no distension. There is no tenderness. There is no rebound.  Musculoskeletal: Normal range of motion. He exhibits no edema.  Neurological: No cranial nerve deficit. He exhibits normal muscle tone. Coordination normal.  Agitated Speech garbled, rapid and pressured. Moving all extremities equally. No nystagmus.  Skin: Skin is warm. No rash noted. He is diaphoretic.  Psychiatric: His speech is rapid and/or pressured. He is  agitated.    ED Course  Procedures (including critical care time) Labs Review Labs Reviewed  CBC  ACETAMINOPHEN LEVEL  COMPREHENSIVE METABOLIC PANEL  ETHANOL  SALICYLATE LEVEL  URINE RAPID DRUG SCREEN (HOSP PERFORMED)    Imaging Review Ct Head Wo Contrast  05/25/2014   CLINICAL DATA:  Altered mental status, drug use, hallucinations  EXAM: CT HEAD  WITHOUT CONTRAST  TECHNIQUE: Contiguous axial images were obtained from the base of the skull through the vertex without intravenous contrast.  COMPARISON:  06/22/2009  FINDINGS: Normal ventricular morphology.  No midline shift or mass effect.  Normal appearance of brain parenchyma.  No intracranial hemorrhage, mass lesion or evidence acute infarction.  No extra-axial fluid collections.  Motion artifacts for which repeat imaging was performed.  Portions of shunt tubing are seen in the LEFT scalp extending to the LEFT frontal region though no intracranial limb is seen.  Bones and sinuses unremarkable.  IMPRESSION: No acute intracranial abnormalities.   Electronically Signed   By: Lavonia Dana M.D.   On: 05/25/2014 19:22   Dg Chest Portable 1 View  05/25/2014   CLINICAL DATA:  Altered mental status.  EXAM: PORTABLE CHEST - 1 VIEW  COMPARISON:  06/22/2009  FINDINGS: Low lung volumes. Heart size is mildly enlarged, part of which is likely related to the low volumes and portable AP lordotic nature of the study. No confluent airspace opacities or effusions. No acute bony abnormality.  IMPRESSION: Heart appears mildly enlarged, but this may be technical. No acute cardiopulmonary disease.   Electronically Signed   By: Rolm Baptise M.D.   On: 05/25/2014 18:47     EKG Interpretation None      Date: 05/26/2014  Rate: 131  Rhythm: sinus tachycardia  QRS Axis: normal  Intervals: normal  ST/T Wave abnormalities: normal  Conduction Disutrbances:none  Narrative Interpretation:   Old EKG Reviewed: none available    MDM   Final diagnoses:  Altered mental status  Substance abuse    18 year old male brought in by police and EMS. He was walking to her neighbor knocking on doors and windows. He admitted to using drugs to EMS providers. Here he is agitated. He will talk but then have episodes where he will become fairly unresponsive to stimuli. No seizure-like activity noted. He was noted to be extremely  diaphoretic with EMS. EMS reported he was hallucinating, seeing spiders and things on the road during transport. This is likely secondary to drug intoxication. However with his history of VP shunt, we will CT his head. Imaging ok, labs ok. UDS positive for amphetamines. Patient still somnolent, will need more time to metabolize. Care transferred to Dr. Claudine Mouton.  Evelina Bucy, MD 05/25/14 2993  Evelina Bucy, MD 05/26/14 [phone]

## 2014-05-25 NOTE — ED Notes (Signed)
Bed: RESB Expected date: 05/25/14 Expected time: 5:46 PM Means of arrival: Ambulance Comments: Confused- Disoriented [employer]  With pt

## 2014-05-25 NOTE — ED Notes (Signed)
Attempted to collect urine sample, pt refused. Will re-attempt.

## 2014-05-25 NOTE — ED Notes (Signed)
In addition to previous note, pt states he smoked marijuana and DMT, took Seroquel pills. Pt denies ETOH use. Per EMS pt was hallucinating about bugs crawling on ground.

## 2014-05-26 ENCOUNTER — Encounter (HOSPITAL_COMMUNITY): Payer: Self-pay | Admitting: Registered Nurse

## 2014-05-26 DIAGNOSIS — F1994 Other psychoactive substance use, unspecified with psychoactive substance-induced mood disorder: Secondary | ICD-10-CM

## 2014-05-26 DIAGNOSIS — F39 Unspecified mood [affective] disorder: Secondary | ICD-10-CM

## 2014-05-26 MED ORDER — HALOPERIDOL LACTATE 5 MG/ML IJ SOLN
10.0000 mg | Freq: Once | INTRAMUSCULAR | Status: AC
  Administered 2014-05-26: 10 mg via INTRAMUSCULAR
  Filled 2014-05-26: qty 2

## 2014-05-26 MED ORDER — CARBAMAZEPINE 200 MG PO TABS
200.0000 mg | ORAL_TABLET | Freq: Two times a day (BID) | ORAL | Status: DC
Start: 2014-05-26 — End: 2014-05-27
  Administered 2014-05-27 (×2): 200 mg via ORAL
  Filled 2014-05-26 (×6): qty 1

## 2014-05-26 MED ORDER — QUETIAPINE FUMARATE 300 MG PO TABS
400.0000 mg | ORAL_TABLET | Freq: Every day | ORAL | Status: DC
  Administered 2014-05-26: 400 mg via ORAL
  Filled 2014-05-26 (×2): qty 1

## 2014-05-26 MED ORDER — SERTRALINE HCL 50 MG PO TABS
75.0000 mg | ORAL_TABLET | Freq: Every day | ORAL | Status: DC
  Administered 2014-05-27: 75 mg via ORAL
  Filled 2014-05-26: qty 1.5

## 2014-05-26 NOTE — ED Provider Notes (Signed)
Patient signed out to me as pending observation to sober and will need psychiatry evaluation.  He was given haldol at time fo sign out for restless and getting out of bed frequently.  He was observed throughout the night and mostly sleeping.  Symptoms did improve and he eventually stopped hallucinating.  He ambulated without assistance and is medically clear for psych eval.  Everlene Balls, MD 05/26/14 [phone]

## 2014-05-26 NOTE — ED Notes (Signed)
Patient met resting in his room. Cheerful and pleasant. Denies pain,SI, AH/VH. No new complaint. Will continue to monitor patient.

## 2014-05-26 NOTE — ED Notes (Signed)
Patient admitted to unit. Brought in by EMS after walking around a neighborhood knocking on doors and holding a cup filled with razor blades. Patient stated he did not sleep well last night. Patient denied homicidal and suicidal thoughts. Patient denied hallucinations. Patient stated that he wanted to go home. Patient is pleasant and cooperative, and expressed no issues. Will continue to monitor.

## 2014-05-26 NOTE — Consult Note (Signed)
[facility] Face-to-Face Psychiatry Consult   Reason for Consult:  Substance abuse/substance induced mood disorder Referring Physician:  EDP  Justin Chaney is an 18 y.o. male. Total Time spent with patient: 45 minutes  Assessment: AXIS I:  Mood Disorder NOS, Substance Abuse and Substance Induced Mood Disorder AXIS II:  Deferred AXIS III:   Past Medical History  Diagnosis Date  . Asthma   . Seizures   . Brain ventricular shunt obstruction     hydrochelpis  . Depression   . Bipolar disorder    AXIS IV:  other psychosocial or environmental problems and problems related to social environment AXIS V:  41-50 serious symptoms  Plan:  Over night observation  Subjective:    HPI:  Justin Chaney is a 18 y.o. male patient who states that he doesn't know why he was brought to the emergency room "I was here when I woke up."  Patient states that he had been hanging out with some friends he got separated "I was trying to find my buddies house and was knocking on some doors cause I couldn't remember which house; and some damn lady called the police.  I got the razors they just for protection. Won't trying to hurt nobody." Patient also states that he is seeing or hearing Nothing" Patient also states that he took two Seroquel and it is making him sleep.  Patient recently discharged from [facility] 10/15.  Patient denies suicidal/homicidal ideation, psychosis, and paranoia.  Patient is irritable, yelling and having to ask question several times to get patient to answer.  Patient states that he is living with a friend in [place] and is compliant with his medications.    Will monitor patient over night   HPI Elements:   Location:  Depression. Quality:  Medication overdose. Severity:  hallucinations. Timing:  1 night..  Past Psychiatric History: Past Medical History  Diagnosis Date  . Asthma   . Seizures   . Brain ventricular shunt obstruction     hydrochelpis  . Depression   . Bipolar disorder      reports that he has been smoking Cigarettes.  He has been smoking about 1.00 pack per day. He does not have any smokeless tobacco history on file. He reports that he drinks about 7.2 ounces of alcohol per week. He reports that he uses illicit drugs (Marijuana, Other-see comments, and Amphetamines). History reviewed. No pertinent family history.         Allergies:   Allergies  Allergen Reactions  . Amoxicillin Hives  . Penicillins Hives and Rash    ACT Assessment Complete:  Yes:    Educational Status    Risk to Self: Risk to self with the past 6 months Is patient at risk for suicide?: No, but patient needs Medical Clearance Substance abuse history and/or treatment for substance abuse?: No  Risk to Others:    Abuse:    Prior Inpatient Therapy:    Prior Outpatient Therapy:    Additional Information:                    Objective: Blood pressure 119/60, pulse 78, temperature 98.1 F (36.7 C), temperature source Oral, resp. rate 18, SpO2 100.00%.There is no weight on file to calculate BMI. Results for orders placed during the hospital encounter of 05/25/14 (from the past 72 hour(s))  ACETAMINOPHEN LEVEL     Status: None   Collection Time    05/25/14  6:27 PM      Result Value Ref  Range   Acetaminophen (Tylenol), Serum <15.0  10 - 30 ug/mL   Comment:            THERAPEUTIC CONCENTRATIONS VARY     SIGNIFICANTLY. A RANGE OF 10-30     ug/mL MAY BE AN EFFECTIVE     CONCENTRATION FOR MANY PATIENTS.     HOWEVER, SOME ARE BEST TREATED     AT CONCENTRATIONS OUTSIDE THIS     RANGE.     ACETAMINOPHEN CONCENTRATIONS     >150 ug/mL AT 4 HOURS AFTER     INGESTION AND >50 ug/mL AT 12     HOURS AFTER INGESTION ARE     OFTEN ASSOCIATED WITH TOXIC     REACTIONS.  CBC     Status: None   Collection Time    05/25/14  6:27 PM      Result Value Ref Range   WBC 7.9  4.0 - 10.5 K/uL   RBC 4.67  4.22 - 5.81 MIL/uL   Hemoglobin 13.3  13.0 - 17.0 g/dL   HCT 39.9  39.0 - 52.0 %    MCV 85.4  78.0 - 100.0 fL   MCH 28.5  26.0 - 34.0 pg   MCHC 33.3  30.0 - 36.0 g/dL   RDW 12.5  11.5 - 15.5 %   Platelets 174  150 - 400 K/uL  COMPREHENSIVE METABOLIC PANEL     Status: Abnormal   Collection Time    05/25/14  6:27 PM      Result Value Ref Range   Sodium 143  137 - 147 mEq/L   Potassium 3.6 (*) 3.7 - 5.3 mEq/L   Chloride 107  96 - 112 mEq/L   CO2 23  19 - 32 mEq/L   Glucose, Bld 109 (*) 70 - 99 mg/dL   BUN 9  6 - 23 mg/dL   Creatinine, Ser 0.86  0.50 - 1.35 mg/dL   Calcium 8.8  8.4 - 10.5 mg/dL   Total Protein 6.7  6.0 - 8.3 g/dL   Albumin 3.7  3.5 - 5.2 g/dL   AST 50 (*) 0 - 37 U/L   ALT 81 (*) 0 - 53 U/L   Alkaline Phosphatase 73  39 - 117 U/L   Total Bilirubin 0.4  0.3 - 1.2 mg/dL   GFR calc non Af Amer >90  >90 mL/min   GFR calc Af Amer >90  >90 mL/min   Comment: (NOTE)     The eGFR has been calculated using the CKD EPI equation.     This calculation has not been validated in all clinical situations.     eGFR's persistently <90 mL/min signify possible Chronic Kidney     Disease.   Anion gap 13  5 - 15  ETHANOL     Status: None   Collection Time    05/25/14  6:27 PM      Result Value Ref Range   Alcohol, Ethyl (B) <11  0 - 11 mg/dL   Comment:            LOWEST DETECTABLE LIMIT FOR     SERUM ALCOHOL IS 11 mg/dL     FOR MEDICAL PURPOSES ONLY  SALICYLATE LEVEL     Status: Abnormal   Collection Time    05/25/14  6:27 PM      Result Value Ref Range   Salicylate Lvl <5.1 (*) 2.8 - 20.0 mg/dL  URINE RAPID DRUG SCREEN (HOSP PERFORMED)     Status: Abnormal  Collection Time    05/25/14  7:03 PM      Result Value Ref Range   Opiates NONE DETECTED  NONE DETECTED   Cocaine NONE DETECTED  NONE DETECTED   Benzodiazepines NONE DETECTED  NONE DETECTED   Amphetamines POSITIVE (*) NONE DETECTED   Tetrahydrocannabinol NONE DETECTED  NONE DETECTED   Barbiturates NONE DETECTED  NONE DETECTED   Comment:            DRUG SCREEN FOR MEDICAL PURPOSES     ONLY.  IF  CONFIRMATION IS NEEDED     FOR ANY PURPOSE, NOTIFY LAB     WITHIN 5 DAYS.                LOWEST DETECTABLE LIMITS     FOR URINE DRUG SCREEN     Drug Class       Cutoff (ng/mL)     Amphetamine      1000     Barbiturate      200     Benzodiazepine   308     Tricyclics       657     Opiates          300     Cocaine          300     THC              50   Labs are reviewed see abnormal values above.  Medications reviewed and no changes made  Current Facility-Administered Medications  Medication Dose Route Frequency Provider Last Rate Last Dose  . carbamazepine (TEGRETOL) tablet 200 mg  200 mg Oral BID Kathlee Nations, MD      . QUEtiapine (SEROQUEL) tablet 400 mg  400 mg Oral QHS Kathlee Nations, MD      . sertraline (ZOLOFT) tablet 75 mg  75 mg Oral Daily Kathlee Nations, MD       Current Outpatient Prescriptions  Medication Sig Dispense Refill  . carbamazepine (TEGRETOL) 200 MG tablet Take 200 mg by mouth 2 (two) times daily.      Marland Kitchen gabapentin (NEURONTIN) 300 MG capsule Take 300 mg by mouth 3 (three) times daily.      . QUEtiapine (SEROQUEL) 400 MG tablet Take 1 tablet (400 mg total) by mouth at bedtime. For mood control.  30 tablet  0  . sertraline (ZOLOFT) 25 MG tablet Take 3 tablets (75 mg total) by mouth daily. For depression.  90 tablet  0    Psychiatric Specialty Exam:     Blood pressure 119/60, pulse 78, temperature 98.1 F (36.7 C), temperature source Oral, resp. rate 18, SpO2 100.00%.There is no weight on file to calculate BMI.  General Appearance: Casual  Eye Contact::  Minimal  Speech:  Garbled  Volume:  Would yell when upset with questiion asked; other than that very low  Mood:  Angry and Irritable  Affect:  Blunt  Thought Process:  Circumstantial  Orientation:  Full (Time, Place, and Person)  Thought Content:  Denies wanting to hurt self or other  Suicidal Thoughts:  No  Homicidal Thoughts:  No  Memory:  Immediate;   Fair Recent;   Fair Remote;   Fair  Judgement:   Impaired  Insight:  Lacking and Shallow  Psychomotor Activity:  Decreased  Concentration:  Fair  Recall:  [place]  Language: Fair  Akathisia:  No  Handed:  Right  AIMS (if indicated):  Assets:  Desire for Improvement Housing  Sleep:      Musculoskeletal: Strength & Muscle Tone: within normal limits Gait & Station: Did not see patient ambulate Patient leans: N/A  Treatment Plan Summary: Monitor patient overnight.  Reassess again tomorrow.  If no changes and patient is able to participate in interview and no suicidal/homicidal ideation, psychosis, or paranoia; patient can be discharged home to follow up with primary psych provider.       Earleen Newport, FNP-BC 05/26/2014 5:12 PM  I have examined the patient and agreed with the findings of H&P and treatment Plan.  Berniece Andreas, MD

## 2014-05-26 NOTE — ED Notes (Signed)
Pt ambulated to restroom with steady gait. He reports that he no longer is hallucinating.

## 2014-05-27 ENCOUNTER — Encounter (HOSPITAL_COMMUNITY): Payer: Self-pay | Admitting: Psychiatry

## 2014-05-27 DIAGNOSIS — F29 Unspecified psychosis not due to a substance or known physiological condition: Secondary | ICD-10-CM

## 2014-05-27 NOTE — Discharge Instructions (Signed)
Psychosis Psychosis refers to a severe lack of understanding with reality. During a psychotic episode, you are not able to think clearly. During a psychotic episode, your responses and emotions are inappropriate and do not coincide with what is actually happening. You often have false beliefs about what is happening or who you are (delusions), and you may see, hear, taste, smell, or feel things that are not present (hallucinations). Psychosis is usually a severe symptom of a very serious mental health (psychiatric) condition, but it can sometimes be the result of a medical condition. CAUSES   Psychiatric conditions, such as:  Schizophrenia.  Bipolar disorder.  Depression.  Personality disorders.  Alcohol or drug abuse.  Medical conditions, such as:  Brain injury.  Brain tumor.  Dementia.  Brain diseases, such as Alzheimer's, Parkinson's, or Huntington's disease.  Neurological diseases, such as epilepsy.  Genetic disorders.  Metabolic disorders.  Infections that affect the brain.  Certain prescription drugs.  Stroke. SYMPTOMS   Unable to think or speak clearly or respond appropriately.  Disorganized thinking (thoughts jump from one thought to another).  Severe inappropriate behavior.  Delusions may include:  A strong belief that is odd, unrealistic, or false.  Feeling extremely fearful or suspicious (paranoid).  Believing you are someone else, have high importance, or have an altered identity.  Hallucinations. DIAGNOSIS   Mental health evaluation.  Physical exam.  Blood tests.  Computerized magnetic scan (MRI) or other brain scans. TREATMENT  Your caregiver will recommend a course of treatment that depends on the cause of the psychosis. Treatment may include:  Monitoring and supportive care in the hospital.  Taking medicines (antipsychotic medicine) to reduce symptoms and balance chemicals in the brain.  Taking medicines to manage underlying  mental health conditions.  Therapy and other supportive programs outside of the hospital.  Treating an underlying medical condition. If the cause of the psychosis can be treated or corrected, the outlook is good. Without treatment, psychotic episodes can cause danger to yourself or others. Treatment may be short-term or lifelong. HOME CARE INSTRUCTIONS   Take all medicines as directed. This is important.  Use a pillbox or write down your medicine schedule to make sure you are taking them.  Check with your caregiver before using over-the-counter medicines, herbs, or supplements.  Seek individual and family support through therapy and mental health education (psychoeducation) programs. These will help you manage symptoms and side effects of medicines, learn life skills, and maintain a healthy routine.  Maintain a healthy lifestyle.  Exercise regularly.  Avoid alcohol and drugs.  Learn ways to reduce stress and cope with stress, such as yoga and meditation.  Talk about your feelings with family members or caregivers.  Make time for yourself to do things you enjoy.  Know the early warning signs of psychosis. Your caregiver will recommend steps to take when you notice symptoms such as:  Feeling anxious or preoccupied.  Having racing thoughts.  Changes in your interest in life and relationships.  Follow up with your caregivers for continued outpatient treatment as directed. SEEK MEDICAL CARE IF:   Medicines do not seem to be helping.  You hear voices telling you to do things.  You see, smell, or feel things that are not there.  You feel hopeless and overwhelmed.  You feel extremely fearful and suspicious that something will harm you.  You feel like you cannot leave your house.  You have trouble taking care of yourself.  You experience side effects of medicines, such as   changes in sleep patterns, dizziness, weight gain, restlessness, movement changes, muscle spasms, or  tremors. SEEK IMMEDIATE MEDICAL CARE IF:  Severe psychotic symptoms present a safety issue (such as an urge to hurt yourself or others). MAKE SURE YOU:   Understand these instructions.  Will watch your condition.  Will get help right away if you are not doing well or get worse. FOR MORE INFORMATION  National Institute of Mental Health: www.nimh.nih.gov Document Released: 01/06/2010 Document Revised: 10/11/2011 Document Reviewed: 01/06/2010 ExitCare Patient Information 2015 [employer]. This information is not intended to replace advice given to you by your health care provider. Make sure you discuss any questions you have with your health care provider.  

## 2014-05-27 NOTE — Consult Note (Signed)
Justin Chaney   Reason for Chaney:  Psychosis Referring Physician:  EDP Justin Chaney is an 18 y.o. male. Total Time spent with patient: 20 minutes  Assessment: AXIS I:  Psychotic Disorder NOS AXIS II:  Deferred AXIS III:   Past Medical History  Diagnosis Date  . Asthma   . Seizures   . Brain ventricular shunt obstruction     hydrochelpis  . Depression   . Bipolar disorder    AXIS IV:  other psychosocial or environmental problems and problems related to social environment AXIS V:  61-70 mild symptoms  Plan:  No evidence of imminent risk to self or others at present.   Patient does not meet criteria for psychiatric inpatient admission.  Subjective:   Justin Chaney is a 18 y.o. male patient. Dr. Darleene Cleaver assessed patient and concurs with the plan.  HPI:  Patient presents to ED stating that he does not remember why he was brought to the ED.  Denies depression, suicidal/homicidal ideations, and hallucinations.  The psych MD knows this patient and states he is at his baseline.  Justin Chaney is living with a friend and will return to live there.  He denies alcohol/drug abuse, positive for amphetamines on   HPI Elements:   Location:  Generalized. Quality:  Acute. Severity:  Mild. Timing:  Intermittent. Duration:  Past weekend. Context:  Stressors.  Past Psychiatric History: Past Medical History  Diagnosis Date  . Asthma   . Seizures   . Brain ventricular shunt obstruction     hydrochelpis  . Depression   . Bipolar disorder     reports that he has been smoking Cigarettes.  He has been smoking about 1.00 pack per day. He does not have any smokeless tobacco history on file. He reports that he drinks about 7.2 ounces of alcohol per week. He reports that he uses illicit drugs (Marijuana, Other-see comments, and Amphetamines). History reviewed. No pertinent family history.         Allergies:   Allergies  Allergen Reactions  . Amoxicillin Hives  .  Penicillins Hives and Rash    ACT Assessment Complete:  Yes:    Educational Status    Risk to Self: Risk to self with the past 6 months Is patient at risk for suicide?: No, but patient needs Medical Clearance Substance abuse history and/or treatment for substance abuse?: Yes  Risk to Others:    Abuse:    Prior Inpatient Therapy:    Prior Outpatient Therapy:    Additional Information:                    Objective: Blood pressure 104/67, pulse 94, temperature 98 F (36.7 C), temperature source Oral, resp. rate 18, SpO2 100.00%.There is no weight on file to calculate BMI. Results for orders placed during the hospital encounter of 05/25/14 (from the past 72 hour(s))  ACETAMINOPHEN LEVEL     Status: None   Collection Time    05/25/14  6:27 PM      Result Value Ref Range   Acetaminophen (Tylenol), Serum <15.0  10 - 30 ug/mL   Comment:            THERAPEUTIC CONCENTRATIONS VARY     SIGNIFICANTLY. A RANGE OF 10-30     ug/mL MAY BE AN EFFECTIVE     CONCENTRATION FOR MANY PATIENTS.     HOWEVER, SOME ARE BEST TREATED     AT CONCENTRATIONS OUTSIDE THIS     RANGE.  ACETAMINOPHEN CONCENTRATIONS     >150 ug/mL AT 4 HOURS AFTER     INGESTION AND >50 ug/mL AT 12     HOURS AFTER INGESTION ARE     OFTEN ASSOCIATED WITH TOXIC     REACTIONS.  CBC     Status: None   Collection Time    05/25/14  6:27 PM      Result Value Ref Range   WBC 7.9  4.0 - 10.5 K/uL   RBC 4.67  4.22 - 5.81 MIL/uL   Hemoglobin 13.3  13.0 - 17.0 g/dL   HCT 39.9  39.0 - 52.0 %   MCV 85.4  78.0 - 100.0 fL   MCH 28.5  26.0 - 34.0 pg   MCHC 33.3  30.0 - 36.0 g/dL   RDW 12.5  11.5 - 15.5 %   Platelets 174  150 - 400 K/uL  COMPREHENSIVE METABOLIC PANEL     Status: Abnormal   Collection Time    05/25/14  6:27 PM      Result Value Ref Range   Sodium 143  137 - 147 mEq/L   Potassium 3.6 (*) 3.7 - 5.3 mEq/L   Chloride 107  96 - 112 mEq/L   CO2 23  19 - 32 mEq/L   Glucose, Bld 109 (*) 70 - 99 mg/dL    BUN 9  6 - 23 mg/dL   Creatinine, Ser 0.86  0.50 - 1.35 mg/dL   Calcium 8.8  8.4 - 10.5 mg/dL   Total Protein 6.7  6.0 - 8.3 g/dL   Albumin 3.7  3.5 - 5.2 g/dL   AST 50 (*) 0 - 37 U/L   ALT 81 (*) 0 - 53 U/L   Alkaline Phosphatase 73  39 - 117 U/L   Total Bilirubin 0.4  0.3 - 1.2 mg/dL   GFR calc non Af Amer >90  >90 mL/min   GFR calc Af Amer >90  >90 mL/min   Comment: (NOTE)     The eGFR has been calculated using the CKD EPI equation.     This calculation has not been validated in all clinical situations.     eGFR's persistently <90 mL/min signify possible Chronic Kidney     Disease.   Anion gap 13  5 - 15  ETHANOL     Status: None   Collection Time    05/25/14  6:27 PM      Result Value Ref Range   Alcohol, Ethyl (B) <11  0 - 11 mg/dL   Comment:            LOWEST DETECTABLE LIMIT FOR     SERUM ALCOHOL IS 11 mg/dL     FOR MEDICAL PURPOSES ONLY  SALICYLATE LEVEL     Status: Abnormal   Collection Time    05/25/14  6:27 PM      Result Value Ref Range   Salicylate Lvl <0.2 (*) 2.8 - 20.0 mg/dL  URINE RAPID DRUG SCREEN (HOSP PERFORMED)     Status: Abnormal   Collection Time    05/25/14  7:03 PM      Result Value Ref Range   Opiates NONE DETECTED  NONE DETECTED   Cocaine NONE DETECTED  NONE DETECTED   Benzodiazepines NONE DETECTED  NONE DETECTED   Amphetamines POSITIVE (*) NONE DETECTED   Tetrahydrocannabinol NONE DETECTED  NONE DETECTED   Barbiturates NONE DETECTED  NONE DETECTED   Comment:  DRUG SCREEN FOR MEDICAL PURPOSES     ONLY.  IF CONFIRMATION IS NEEDED     FOR ANY PURPOSE, NOTIFY LAB     WITHIN 5 DAYS.                LOWEST DETECTABLE LIMITS     FOR URINE DRUG SCREEN     Drug Class       Cutoff (ng/mL)     Amphetamine      1000     Barbiturate      200     Benzodiazepine   761     Tricyclics       518     Opiates          300     Cocaine          300     THC              50   Labs are reviewed and are pertinent for no major medical  issues.  Current Facility-Administered Medications  Medication Dose Route Frequency Provider Last Rate Last Dose  . carbamazepine (TEGRETOL) tablet 200 mg  200 mg Oral BID Kathlee Nations, MD   200 mg at 05/27/14 1053  . QUEtiapine (SEROQUEL) tablet 400 mg  400 mg Oral QHS Kathlee Nations, MD   400 mg at 05/26/14 2310  . sertraline (ZOLOFT) tablet 75 mg  75 mg Oral Daily Kathlee Nations, MD   75 mg at 05/27/14 1053   Current Outpatient Prescriptions  Medication Sig Dispense Refill  . carbamazepine (TEGRETOL) 200 MG tablet Take 200 mg by mouth 2 (two) times daily.      Marland Kitchen gabapentin (NEURONTIN) 300 MG capsule Take 300 mg by mouth 3 (three) times daily.      . QUEtiapine (SEROQUEL) 400 MG tablet Take 1 tablet (400 mg total) by mouth at bedtime. For mood control.  30 tablet  0  . sertraline (ZOLOFT) 25 MG tablet Take 3 tablets (75 mg total) by mouth daily. For depression.  90 tablet  0    Psychiatric Specialty Exam:     Blood pressure 104/67, pulse 94, temperature 98 F (36.7 C), temperature source Oral, resp. rate 18, SpO2 100.00%.There is no weight on file to calculate BMI.  General Appearance: Casual  Eye Contact::  Good  Speech:  Normal Rate  Volume:  Normal  Mood:  Anxious  Affect:  Appropriate and Congruent  Thought Process:  Coherent  Orientation:  Full (Time, Place, and Person)  Thought Content:  WDL  Suicidal Thoughts:  No  Homicidal Thoughts:  No  Memory:  Immediate;   Good Recent;   Good Remote;   Good  Judgement:  Fair  Insight:  Good  Psychomotor Activity:  Normal  Concentration:  Good  Recall:  Good  Fund of Knowledge:Fair  Language: Fair  Akathisia:  NA  Handed:  Right  AIMS (if indicated):     Assets:  Communication Skills Desire for Improvement Physical Health Social Support  Sleep:      Musculoskeletal: Strength & Muscle Tone: within normal limits Gait & Station: normal Patient leans: N/A  Treatment Plan Summary: Patient is to be discharged home and  follow-up with his regular providers.  Justin Chaney PMH-NP 05/27/2014 11:35 AM  Patient seen, evaluated and I agree with notes by Nurse Practitioner. Corena Pilgrim, MD

## 2014-05-27 NOTE — [facility] Suicide Risk Assessment (Signed)
Suicide Risk Assessment  Discharge Assessment     Demographic Factors:  Male and Adolescent or young adult  Total Time spent with patient: 25 minutes   Psychiatric Specialty Exam:     Blood pressure 104/67, pulse 94, temperature 98 F (36.7 C), temperature source Oral, resp. rate 18, SpO2 100.00%.There is no weight on file to calculate BMI.  General Appearance: Casual  Eye Contact::  Good  Speech:  Normal Rate  Volume:  Normal  Mood:  Anxious  Affect:  Appropriate and Congruent  Thought Process:  Coherent  Orientation:  Full (Time, Place, and Person)  Thought Content:  WDL  Suicidal Thoughts:  No  Homicidal Thoughts:  No  Memory:  Immediate;   Good Recent;   Good Remote;   Good  Judgement:  Fair  Insight:  Good  Psychomotor Activity:  Normal  Concentration:  Good  Recall:  Good  Fund of Knowledge:Fair  Language: Fair  Akathisia:  NA  Handed:  Right  AIMS (if indicated):     Assets:  Communication Skills Desire for Improvement Physical Health Social Support  Sleep:      Musculoskeletal: Strength & Muscle Tone: within normal limits Gait & Station: normal Patient leans: N/A  Mental Status Per Nursing Assessment::   On Admission:   Hallucinations  Current Mental Status by Physician: NA  Loss Factors: NA  Historical Factors: NA  Risk Reduction Factors:   Sense of responsibility to family, Living with another person, especially a relative, Positive social support and Positive therapeutic relationship  Continued Clinical Symptoms:  None  Cognitive Features That Contribute To Risk:  None  Suicide Risk:  Minimal: No identifiable suicidal ideation.  Patients presenting with no risk factors but with morbid ruminations; may be classified as minimal risk based on the severity of the depressive symptoms  Discharge Diagnoses:   AXIS I:  Substance Abuse AXIS II:  Deferred AXIS III:   Past Medical History  Diagnosis Date  . Asthma   . Seizures   .  Brain ventricular shunt obstruction     hydrochelpis  . Depression   . Bipolar disorder    AXIS IV:  other psychosocial or environmental problems, problems related to social environment and problems with primary support group AXIS V:  61-70 mild symptoms  Plan Of Care/Follow-up recommendations:  Activity:  as tolerated Diet:  heart healthy diet  Is patient on multiple antipsychotic therapies at discharge:  No   Has Patient had three or more failed trials of antipsychotic monotherapy by history:  No  Recommended Plan for Multiple Antipsychotic Therapies: NA    Saprina Chuong, [place], PMH-NP 05/27/2014, 4:04 PM

## 2014-05-27 NOTE — ED Notes (Signed)
Patient discharged to home.  Verbalized understanding of all instructions and follow up care.  He was given a [place] bus pass to get home.  He was encouraged to follow up with his outpatient provider and to continue his current medications.

## 2014-10-12 ENCOUNTER — Emergency Department (HOSPITAL_COMMUNITY)
Admission: EM | Admit: 2014-10-12 | Discharge: 2014-10-18 | Disposition: A | Discharge status: Other Acute Inpatient Hospital | Payer: BLUE CROSS/BLUE SHIELD | Attending: Emergency Medicine | Admitting: Emergency Medicine

## 2014-10-12 ENCOUNTER — Encounter (HOSPITAL_COMMUNITY): Payer: Self-pay | Admitting: Emergency Medicine

## 2014-10-12 ENCOUNTER — Emergency Department (HOSPITAL_COMMUNITY): Payer: Self-pay

## 2014-10-12 DIAGNOSIS — F0630 Mood disorder due to known physiological condition, unspecified: Secondary | ICD-10-CM

## 2014-10-12 DIAGNOSIS — R45850 Homicidal ideations: Secondary | ICD-10-CM

## 2014-10-12 DIAGNOSIS — R45851 Suicidal ideations: Secondary | ICD-10-CM

## 2014-10-12 DIAGNOSIS — F1510 Other stimulant abuse, uncomplicated: Secondary | ICD-10-CM | POA: Diagnosis present

## 2014-10-12 DIAGNOSIS — F1994 Other psychoactive substance use, unspecified with psychoactive substance-induced mood disorder: Secondary | ICD-10-CM | POA: Diagnosis present

## 2014-10-12 DIAGNOSIS — Z982 Presence of cerebrospinal fluid drainage device: Secondary | ICD-10-CM | POA: Insufficient documentation

## 2014-10-12 DIAGNOSIS — F3164 Bipolar disorder, current episode mixed, severe, with psychotic features: Secondary | ICD-10-CM | POA: Diagnosis present

## 2014-10-12 LAB — CBC
HCT: 41.4 % (ref 39.0–52.0)
Hemoglobin: 14.2 g/dL (ref 13.0–17.0)
MCH: 27.9 pg (ref 26.0–34.0)
MCHC: 34.3 g/dL (ref 30.0–36.0)
MCV: 81.3 fL (ref 78.0–100.0)
Platelets: 268 10*3/uL (ref 150–400)
RBC: 5.09 MIL/uL (ref 4.22–5.81)
RDW: 12 % (ref 11.5–15.5)
WBC: 8.7 10*3/uL (ref 4.0–10.5)

## 2014-10-12 LAB — COMPREHENSIVE METABOLIC PANEL
ALT: 23 U/L (ref 0–53)
AST: 28 U/L (ref 0–37)
Albumin: 4 g/dL (ref 3.5–5.2)
Alkaline Phosphatase: 81 U/L (ref 39–117)
Anion gap: 5 (ref 5–15)
BUN: 12 mg/dL (ref 6–23)
CO2: 27 mmol/L (ref 19–32)
Calcium: 9.2 mg/dL (ref 8.4–10.5)
Chloride: 108 mmol/L (ref 96–112)
Creatinine, Ser: 0.94 mg/dL (ref 0.50–1.35)
GFR calc Af Amer: >90 mL/min (ref >90)
GFR calc non Af Amer: >90 mL/min (ref >90)
Glucose, Bld: 138 mg/dL — ABNORMAL HIGH (ref 70–99)
Potassium: 3.4 mmol/L — ABNORMAL LOW (ref 3.5–5.1)
Sodium: 140 mmol/L (ref 135–145)
Total Bilirubin: 1 mg/dL (ref 0.3–1.2)
Total Protein: 7 g/dL (ref 6.0–8.3)

## 2014-10-12 LAB — ETHANOL: Alcohol, Ethyl (B): <5 mg/dL (ref 0–9)

## 2014-10-12 LAB — ACETAMINOPHEN LEVEL: Acetaminophen (Tylenol), Serum: <10 ug/mL — ABNORMAL LOW (ref 10–30)

## 2014-10-12 LAB — RAPID URINE DRUG SCREEN, HOSP PERFORMED
Amphetamines: POSITIVE — AB
Barbiturates: NOT DETECTED
Benzodiazepines: NOT DETECTED
Cocaine: NOT DETECTED
Opiates: NOT DETECTED
Tetrahydrocannabinol: NOT DETECTED

## 2014-10-12 LAB — SALICYLATE LEVEL: Salicylate Lvl: <4 mg/dL (ref 2.8–20.0)

## 2014-10-12 LAB — CARBAMAZEPINE LEVEL, TOTAL: Carbamazepine Lvl: <2 ug/mL — ABNORMAL LOW (ref 4.0–12.0)

## 2014-10-12 MED ORDER — ALUM & MAG HYDROXIDE-SIMETH 200-200-20 MG/5ML PO SUSP: 30.0000 mL | ORAL | Status: DC | PRN

## 2014-10-12 MED ORDER — SERTRALINE HCL 50 MG PO TABS
75.0000 mg | ORAL_TABLET | Freq: Every day | ORAL | Status: DC
  Administered 2014-10-12 – 2014-10-14 (×2): 75 mg via ORAL
  Filled 2014-10-12 (×5): qty 2

## 2014-10-12 MED ORDER — QUETIAPINE FUMARATE 300 MG PO TABS
400.0000 mg | ORAL_TABLET | Freq: Every day | ORAL | Status: DC
  Administered 2014-10-17: 400 mg via ORAL
  Filled 2014-10-12: qty 1
  Filled 2014-10-12 (×2): qty 2
  Filled 2014-10-12: qty 1
  Filled 2014-10-12: qty 2

## 2014-10-12 MED ORDER — IBUPROFEN 200 MG PO TABS: 600.0000 mg | ORAL_TABLET | Freq: Three times a day (TID) | ORAL | Status: DC | PRN

## 2014-10-12 MED ORDER — ACETAMINOPHEN 325 MG PO TABS: 650.0000 mg | ORAL_TABLET | ORAL | Status: DC | PRN

## 2014-10-12 MED ORDER — GABAPENTIN 300 MG PO CAPS
300.0000 mg | ORAL_CAPSULE | Freq: Three times a day (TID) | ORAL | Status: DC
  Administered 2014-10-12 – 2014-10-18 (×5): 300 mg via ORAL
  Filled 2014-10-12 (×12): qty 1

## 2014-10-12 MED ORDER — LORAZEPAM 1 MG PO TABS: 1.0000 mg | ORAL_TABLET | Freq: Three times a day (TID) | ORAL | Status: DC | PRN

## 2014-10-12 MED ORDER — CARBAMAZEPINE 200 MG PO TABS
200.0000 mg | ORAL_TABLET | Freq: Two times a day (BID) | ORAL | Status: DC
  Administered 2014-10-12 – 2014-10-18 (×4): 200 mg via ORAL
  Filled 2014-10-12 (×13): qty 1

## 2014-10-12 MED ORDER — ONDANSETRON HCL 4 MG PO TABS: 4.0000 mg | ORAL_TABLET | Freq: Three times a day (TID) | ORAL | Status: DC | PRN

## 2014-10-12 NOTE — BHH Counselor (Signed)
Justin Chaney, [name], confirms adult unit is currently at capacity. Contacted the following facilities for placement:  INFORMATION HAS ALREADY BEEN FAXED, AWAITING RESPONSE: [facility], per [place], per [employer]  AT CAPACITY: [name], per [facility], per [place] (No beds until Monday) [name], per [name], per [name], per [name], per [name], per [name], per [name] (No beds until Monday) [name], per [name], per [name], per [place] (No beds until Monday) [name], per [name], per [name], per [facility], per [name], per [name], per [name], per [name] per [name], [name], [name] Triage Specialist [phone]

## 2014-10-12 NOTE — ED Notes (Signed)
Staffing and charge RN notified of patient needing sitter.

## 2014-10-12 NOTE — ED Notes (Signed)
ivc papers served by gpd

## 2014-10-12 NOTE — Progress Notes (Signed)
LCSW contacted Dr. Sabra Heck at [phone] and requested patient be IVC'd due to homicidal ideation reported during his assessment.

## 2014-10-12 NOTE — ED Notes (Signed)
ivc papers signed by dr Sabra Heck

## 2014-10-12 NOTE — ED Notes (Addendum)
Pt refused Neurontin. States does not want to take d/t too sleepy.

## 2014-10-12 NOTE — ED Notes (Signed)
Pt states aware of urine specimen needed. States does not feel the urge to void at this time.

## 2014-10-12 NOTE — ED Notes (Signed)
Urine specimen cup on bedside table.

## 2014-10-12 NOTE — BH Assessment (Addendum)
Assessment Note  Justin Chaney is an 19 y.o. male.walking in for treatment. Patient reports that he is hearing voices, but would not disclose what the voices are saying. Patient states "I'm a homicidal mother f*cker, I'll kill anybody, it can be in front of their kids, I don't care." Patient reports " I think about killing and torturing people in ways that you can't imagine." Patient reports that he is very "dark and demonic" but would not explain what that meant for him.   Patient reports that he is here to seek treatment, but "time is ticking, and I'm not playing, I'm ready." Patient reports that he is interested in more than receiving medications and would like to be admitted to a hospital to learn coping skills and "to keep other people safe." Patient reports that he "needs more than medicine, I need physical help." Patient reports that he has poor impulse control when he becomes angry, but would not disclose if he has been violent in the past. When asked about an identified victim and pan patient reports "you know I can't tell you that." Patient reports that he lives "with people" and would not disclose who the people were. Patient reports that he is unable to return there. Patient reports that he does not have any friends or family that are supportive. Patient reports that he has used drugs in the past but "takes Adderral from time to time." Patient denies Alcohol use. Patient denies SI with no intent or plan.  Patient meets inpatient criteria per Diamantina Monks, NP.   Axis I: Psychosis Axis II: Deferred Axis III:  Past Medical History  Diagnosis Date  . Asthma   . Seizures   . Brain ventricular shunt obstruction     hydrochelpis  . Depression   . Bipolar disorder    Axis IV: economic problems, educational problems, housing problems, occupational problems, problems related to social environment, problems with access to health care services and problems with primary support group Axis V:  21-30 behavior considerably influenced by delusions or hallucinations OR serious impairment in judgment, communication OR inability to function in almost all areas  Past Medical History:  Past Medical History  Diagnosis Date  . Asthma   . Seizures   . Brain ventricular shunt obstruction     hydrochelpis  . Depression   . Bipolar disorder     Past Surgical History  Procedure Laterality Date  . Tonsillectomy    . Ventriculo-peritoneal shunt placement / laparoscopic insertion peritoneal catheter      Family History: No family history on file.  Social History:  reports that he has been smoking Cigarettes.  He has been smoking about 1.00 pack per day. He does not have any smokeless tobacco history on file. He reports that he drinks about 7.2 oz of alcohol per week. He reports that he uses illicit drugs (Marijuana, Other-see comments, and Amphetamines).  Additional Social History:     CIWA: CIWA-Ar BP: (!) 96/46 mmHg Pulse Rate: 84 COWS:    Allergies:  Allergies  Allergen Reactions  . Amoxicillin Hives  . Penicillins Hives and Rash    Home Medications:  (Not in a hospital admission)  OB/GYN Status:  No LMP for male patient.  General Assessment Data Location of Assessment: [facility] ED ACT Assessment: Yes Is this a Tele or Face-to-Face Assessment?: Tele Assessment Is this an Initial Assessment or a Re-assessment for this encounter?: Initial Assessment Living Arrangements: Non-relatives/Friends Can pt return to current living arrangement?: No Admission Status: Voluntary  Referral Source: Self/Family/Friend     [facility] Crisis Care Plan Living Arrangements: Non-relatives/Friends Name of Psychiatrist: Unsure of his name Name of Therapist: N/A  Education Status Is patient currently in school?: No Highest grade of school patient has completed:  (9th Grade)  Risk to self with the past 6 months Suicidal Ideation: No Suicidal Intent: No Is patient at risk for suicide?:  No Suicidal Plan?: No Access to Means: No What has been your use of drugs/alcohol within the last 12 months?:  (Patient reports that he uses "aderral from time to time.") Previous Attempts/Gestures:  (Pt refused to answer the question) Triggers for Past Attempts: Unknown Intentional Self Injurious Behavior: None Family Suicide History: Unknown Recent stressful life event(s): Other (Comment) (Patient refused to say what was stressful ) Persecutory voices/beliefs?: Yes Depression: No Depression Symptoms: Feeling angry/irritable Substance abuse history and/or treatment for substance abuse?: Yes Suicide prevention information given to non-admitted patients: Not applicable  Risk to Others within the past 6 months Homicidal Ideation: Yes-Currently Present Thoughts of Harm to Others: Yes-Currently Present Comment - Thoughts of Harm to Others:  (Patient refused to disclose) Current Homicidal Intent: Yes-Currently Present Current Homicidal Plan: Yes-Currently Present Describe Current Homicidal Plan:  (Patient refused to disclose) Access to Homicidal Means:  (Patient refused to disclose) Identified Victim:  (Patient refused to disclose) History of harm to others?:  (Patient refused to disclose) Assessment of Violence:  (Patient refused to disclose) Violent Behavior Description:  (Patient refused to disclose) Does patient have access to weapons?:  (Patient refused to disclose) Criminal Charges Pending?: No Does patient have a court date: No  Psychosis Hallucinations: Auditory (Patient refused to disclose) Delusions: Unspecified  Mental Status Report Appear/Hygiene: In scrubs Eye Contact: Poor Motor Activity: Agitation Speech: Aggressive Level of Consciousness: Drowsy, Irritable Mood: Irritable, Threatening Anxiety Level: Moderate Thought Processes: Coherent, Relevant Judgement: Unimpaired Orientation: Person, Place, Time, Situation Obsessive Compulsive Thoughts/Behaviors:  None  Cognitive Functioning Concentration: Normal Memory: Recent Impaired, Remote Impaired IQ: Average Insight: Good Impulse Control: Poor Appetite: Good Sleep: No Change Vegetative Symptoms: None  ADLScreening [facility] Assessment Services) Patient's cognitive ability adequate to safely complete daily activities?: Yes Patient able to express need for assistance with ADLs?: Yes Independently performs ADLs?: Yes (appropriate for developmental age)  Prior Inpatient Therapy Prior Inpatient Therapy: Yes Prior Therapy Dates: 2015 Reason for Treatment:  (Hearing voices)  Prior Outpatient Therapy Prior Outpatient Therapy: No  ADL Screening (condition at time of admission) Patient's cognitive ability adequate to safely complete daily activities?: Yes Patient able to express need for assistance with ADLs?: Yes Independently performs ADLs?: Yes (appropriate for developmental age)             Advance Directives (For Healthcare) Does patient have an advance directive?: No    Additional Information 1:1 In Past 12 Months?: No CIRT Risk: No Elopement Risk: No     Disposition:  Disposition Initial Assessment Completed for this Encounter: Yes Disposition of Patient: Inpatient treatment program Type of inpatient treatment program: Adult  On Site Evaluation by:   Reviewed with Physician:    Irean Hong 10/12/2014 8:04 AM

## 2014-10-12 NOTE — ED Provider Notes (Signed)
CSN: [id number]     Arrival date & time 10/12/14  0353 History   First MD Initiated Contact with Patient 10/12/14 0441     Chief Complaint  Patient presents with  . Homicidal  . Mental Health Problem     (Consider location/radiation/quality/duration/timing/severity/associated sxs/prior Treatment) The history is provided by the patient.  Justin Chaney is a 19 y.o. male hx of hydrocephalus s/p VP shunt, depression, bipolar here with homicidal thoughts. He states that he is thinking about killing people with his bare hands and legs status and demons. However he does not want to talk to me and refused to answer many questions. He has been very guarded and said that "if he wanted harm people he work done so already". Denies any suicidal ideations. He wants to get off of drugs but is only using marijuana and amphetamines. Triage nurse notes that he has been asking for pain medications.     Level V caveat- condition of patient  Past Medical History  Diagnosis Date  . Asthma   . Seizures   . Brain ventricular shunt obstruction     hydrochelpis  . Depression   . Bipolar disorder    Past Surgical History  Procedure Laterality Date  . Tonsillectomy    . Ventriculo-peritoneal shunt placement / laparoscopic insertion peritoneal catheter     No family history on file. History  Substance Use Topics  . Smoking status: Current Every Day Smoker -- 1.00 packs/day    Types: Cigarettes  . Smokeless tobacco: Not on file  . Alcohol Use: 7.2 oz/week    12 Cans of beer per week     Comment: 4-8 40 oz.on the weekend    Review of Systems  Psychiatric/Behavioral: Positive for agitation.  All other systems reviewed and are negative.     Allergies  Amoxicillin and Penicillins  Home Medications   Prior to Admission medications   Medication Sig Start Date End Date Taking? Authorizing Provider  carbamazepine (TEGRETOL) 200 MG tablet Take 200 mg by mouth 2 (two) times daily.    Historical  Provider, MD  gabapentin (NEURONTIN) 300 MG capsule Take 300 mg by mouth 3 (three) times daily.    Historical Provider, MD  QUEtiapine (SEROQUEL) 400 MG tablet Take 1 tablet (400 mg total) by mouth at bedtime. For mood control. 04/10/14   Kerrie Buffalo, NP  sertraline (ZOLOFT) 25 MG tablet Take 3 tablets (75 mg total) by mouth daily. For depression. 04/10/14   Kerrie Buffalo, NP   BP 96/46 mmHg  Pulse 84  Temp(Src) 98.4 F (36.9 C) (Oral)  Resp 22  Ht 5\' 3"  (1.6 m)  SpO2 97% Physical Exam  Constitutional: He is oriented to person, place, and time.  Agitated   HENT:  Head: Normocephalic.  Eyes: Conjunctivae are normal. Pupils are equal, round, and reactive to light.  Neck: Normal range of motion.  Cardiovascular: Normal rate.   Pulmonary/Chest: Effort normal and breath sounds normal. No respiratory distress. He has no wheezes.  Abdominal: Bowel sounds are normal. He exhibits no distension. There is no tenderness. There is no rebound.  Musculoskeletal: Normal range of motion.  Neurological: He is alert and oriented to person, place, and time. Coordination normal.  Skin: Skin is warm and dry.  Psychiatric:  Agitated, poor judgement   Nursing note and vitals reviewed.   ED Course  Procedures (including critical care time) Labs Review Labs Reviewed  ACETAMINOPHEN LEVEL - Abnormal; Notable for the following:    Acetaminophen (Tylenol), Serum <  10.0 (*)    All other components within normal limits  COMPREHENSIVE METABOLIC PANEL - Abnormal; Notable for the following:    Potassium 3.4 (*)    Glucose, Bld 138 (*)    All other components within normal limits  CARBAMAZEPINE LEVEL, TOTAL - Abnormal; Notable for the following:    Carbamazepine Lvl <2.0 (*)    All other components within normal limits  CBC  ETHANOL  SALICYLATE LEVEL  URINE RAPID DRUG SCREEN (HOSP PERFORMED)    Imaging Review Ct Head Wo Contrast  10/12/2014   CLINICAL DATA:  Homicidal, mental health problem. History  of VP shunt.  EXAM: CT HEAD WITHOUT CONTRAST  TECHNIQUE: Contiguous axial images were obtained from the base of the skull through the vertex without intravenous contrast.  COMPARISON:  05/25/2014  FINDINGS: No intracranial hemorrhage, mass effect, or midline shift. No hydrocephalus. The basilar cisterns are patent. No evidence of territorial infarct. No intracranial fluid collection. There is shunt catheter tubing in the upper left neck and scalp, no reservoir or intracranial portion is seen, this is unchanged. Calvarium is intact. Included paranasal sinuses and mastoid air cells are well aerated.  IMPRESSION: No acute intracranial abnormality. Shunt catheter tubing in the left cranial soft tissues, no intracranial limb is seen, unchanged.   Electronically Signed   By: Jeb Levering M.D.   On: 10/12/2014 06:30     EKG Interpretation None      MDM   Final diagnoses:  None   Justin Chaney is a 19 y.o. male here with homicidal ideations. Doesn't want to talk much. Will get CT head give VP shunt. Will get psych clearance labs.   6:54 AM CT head unchanged. Labs unremarkable. Medically cleared. Will get TTS consult.    Wandra Arthurs, MD 10/12/14 [phone]

## 2014-10-12 NOTE — Progress Notes (Signed)
LCSW staffed case with Diamantina Monks, NP who reports patient meets criteria for inpatient treatment at this time.

## 2014-10-12 NOTE — ED Notes (Signed)
ivc papers faxed to magistrate

## 2014-10-12 NOTE — ED Notes (Signed)
tts in progress 

## 2014-10-12 NOTE — ED Notes (Signed)
Patient with homicidal thoughts, wanting detox off drugs.  Patient is talking about killing people with his bare hands, stating that he likes death, demons and likes blood.  Patient speaking about "F" the church, I am a crazy MF."

## 2014-10-12 NOTE — ED Notes (Signed)
Patient has returned from [place].  Patient is resting and has been more cooperative.

## 2014-10-12 NOTE — Progress Notes (Signed)
CSW follow up on placement attempts made by previous shift and made new referrals.  P[facility]: [facility]: [facility]: [place]: Justin Chaney reports to review today  [place]: [facility]: [place]: [facility]: [place]: [place]: Beau Fanny reported did not have referral but did not have beds today [place]: [place]  [place]    CSW will monitor.  Hillery Hunter, LCSW Disposition Social Worker [phone]

## 2014-10-13 NOTE — Progress Notes (Addendum)
CSW spoke briefly with this 19 y/o, single, African-American, male that presents in hospital garb and is under IVC.  Patient states he has been using "Adderal", this is not prescribed.  He is refusing medications at this time.  Patient is agitated and becomes defensive when asked questions about himself.  Otherwise is talking and joking with his sitter. Patient has similar presentation in his last four ED admissions.  Patient tends to present positive UDS for amphetamines and having bizarre H/I and A/H.  Upon last ED admission on 05/25/14 patient was administered a haldol injection and patient cleared up, possibly sobered up.  Attending at time though it was possibly substance induced.  [facility] Noa Constante Richardo Priest ED CSW [phone]

## 2014-10-13 NOTE — ED Notes (Signed)
Pt refuses to answer any questions and has refused to take medication states that he is not going to take the medications because he does not need them and that he does not have to answer any of my questions because he is a grown man and can do what he wants to. Pt continued to use profane language and was told that such behaviors is not tolerated. Pt's response was he does not care.

## 2014-10-14 DIAGNOSIS — R454 Irritability and anger: Secondary | ICD-10-CM | POA: Diagnosis not present

## 2014-10-14 DIAGNOSIS — Z9114 Patient's other noncompliance with medication regimen: Secondary | ICD-10-CM | POA: Diagnosis not present

## 2014-10-14 DIAGNOSIS — R440 Auditory hallucinations: Secondary | ICD-10-CM

## 2014-10-14 DIAGNOSIS — R45850 Homicidal ideations: Secondary | ICD-10-CM | POA: Diagnosis not present

## 2014-10-14 NOTE — ED Notes (Addendum)
Snack and drink to patient

## 2014-10-14 NOTE — BH Assessment (Signed)
Tori Beck, AC at [facility], confirmed adult unit is currently at capacity. Contacted the following facilities for placement:  AT CAPACITY: [facility], per [facility], per [facility] [place], per [facility], per [facility], per [facility], per [facility], per [facility], per Nancy [place], per [facility], per [facility], per [facility], per [facility], per Janie [facility] [place], per Melanie [place], per [facility], per Bernadine [place], per Ron [facility], per Denise [place], per Sara [place], per [facility], per [facility], per [place], per Stephanie   Qiara Minetti Ellis Fermon Ureta Jr, LPC, [facility] Triage Specialist [phone] 

## 2014-10-14 NOTE — ED Notes (Signed)
TTS IN PROGRESS. PT PARTICIPATING

## 2014-10-14 NOTE — Progress Notes (Signed)
CSW left voicemail for ACT department at [facility], requesting return call regarding Pt status with ACTT and a possible ED visit to assess Pt.   [name], [name] 10/14/2014 11:23 AM

## 2014-10-14 NOTE — Consult Note (Signed)
Telepsych Consultation   Reason for Consult:  Psychiatric Re-evaluation Referring Physician:  EDP Patient Identification: Justin Chaney MRN:  [id number] Principal Diagnosis: Homicidal Ideation Diagnosis:   Patient Active Problem List   Diagnosis Date Noted  . Bipolar disorder [F31.9] 02/11/2014  . Substance induced mood disorder [F19.94] 02/08/2014  . Suicidal ideations [R45.851] 12/31/2013  . Homicidal ideations [R45.850] 12/31/2013  . Psychosis [F29] 12/31/2013  . 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 Cannabis abuse [F12.10] 08/15/2012  . 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 male patient seen via tele-psychiatry today . Patient states he is here voluntarily and that he has been having "homicidal thoughts and bad anxiety and thinking about drinking people's blood." He states he has been on Zoloft, Neurontin, and Tegretol in the past but states he stopped these last year "because meds don't work." He states he has been seen at [facility] in the past but has not been there since 2015. He does not have an ACT team. He denies suicidal ideation, intent or plan. He endorses homicidal ideation; there is no specific individual he has ideations towards, stating "just anybody."  He also endorses auditory hallucinations, however, he will not disclose the nature of these hallucinations stating "I don't want to talk about it."  HPI:  Justin Chaney is a 19 yo male here for homicidal ideations. He is irritable on assessment stating he has anger issues and "I have a really short fuse."  He states "I am a psychopathic person" but when asked to elaborate on the meaning of his statement he would not do so. He states he does not have a psychiatrist and is not currently receiving outpatient treatment. He states he was hospitalized last year at [facility] and  previously at [facility]. He states he was hospitalized at [facility] at age 70.   HPI Elements:   Location:  Mood. Quality:  Homicidal, irritable. Severity:  Severe. Timing:  Ongoing. Context:  Stressors, auditory hallucinations, medication non-compliance  Past Medical History:  Past Medical History  Diagnosis Date  . Asthma   . Seizures   . Brain ventricular shunt obstruction     hydrochelpis  . Depression   . Bipolar disorder     Past Surgical History  Procedure Laterality Date  . Tonsillectomy    . Ventriculo-peritoneal shunt placement / laparoscopic insertion peritoneal catheter     Family History: No family history on file. Social History:  History  Alcohol Use  . 7.2 oz/week  . 12 Cans of beer per week    Comment: 4-8 40 oz.on the weekend     History  Drug Use  . Yes  . Special: Marijuana, Other-see comments, Amphetamines    Comment: Adderall, "I don't do weed, only rarely"    History   Social History  . Marital Status: Single    Spouse Name: N/A  . Number of Children: N/A  . Years of Education: N/A   Social History Main Topics  . Smoking status: Current Every Day Smoker -- 1.00 packs/day    Types: Cigarettes  . Smokeless tobacco: Not on file  . Alcohol Use: 7.2 oz/week    12 Cans of beer per week     Comment: 4-8 40 oz.on the weekend  . Drug Use: Yes    Special: Marijuana, Other-see comments, Amphetamines     Comment: Adderall, "I don't  do weed, only rarely"  . Sexual Activity: Not on file   Other Topics Concern  . None   Social History Narrative   Additional Social History:   Allergies:   Allergies  Allergen Reactions  . Amoxicillin Hives  . Penicillins Hives and Rash    Vitals: Blood pressure 111/46, pulse 80, temperature 97.6 F (36.4 C), temperature source Oral, resp. rate 20, height 5\' 3"  (1.6 m), SpO2 98 %.  Risk to Self: Suicidal Ideation: No Suicidal Intent: No Is patient at risk for suicide?: No Suicidal  Plan?: No Access to Means: No What has been your use of drugs/alcohol within the last 12 months?:  (Patient reports that he uses "aderral from time to time.") Triggers for Past Attempts: Unknown Intentional Self Injurious Behavior: None Risk to Others: Homicidal Ideation: Yes-Currently Present Thoughts of Harm to Others: Yes-Currently Present Comment - Thoughts of Harm to Others:  (Patient refused to disclose) Current Homicidal Intent: Yes-Currently Present Current Homicidal Plan: Yes-Currently Present Describe Current Homicidal Plan:  (Patient refused to disclose) Access to Homicidal Means:  (Patient refused to disclose) Identified Victim:  (Patient refused to disclose) History of harm to others?:  (Patient refused to disclose) Assessment of Violence:  (Patient refused to disclose) Violent Behavior Description:  (Patient refused to disclose) Does patient have access to weapons?:  (Patient refused to disclose) Criminal Charges Pending?: No Does patient have a court date: No Prior Inpatient Therapy: Prior Inpatient Therapy: Yes Prior Therapy Dates: 2015 Reason for Treatment:  (Hearing voices) Prior Outpatient Therapy: Prior Outpatient Therapy: No  Current Facility-Administered Medications  Medication Dose Route Frequency Provider Last Rate Last Dose  . acetaminophen (TYLENOL) tablet 650 mg  650 mg Oral Q4H PRN Wandra Arthurs, MD      . alum & mag hydroxide-simeth (MAALOX/MYLANTA) 200-200-20 MG/5ML suspension 30 mL  30 mL Oral PRN Davonna Belling, MD      . carbamazepine (TEGRETOL) tablet 200 mg  200 mg Oral BID Davonna Belling, MD   200 mg at 10/14/14 0951  . gabapentin (NEURONTIN) capsule 300 mg  300 mg Oral TID Davonna Belling, MD   300 mg at 10/14/14 0947  . ibuprofen (ADVIL,MOTRIN) tablet 600 mg  600 mg Oral Q8H PRN Wandra Arthurs, MD      . LORazepam (ATIVAN) tablet 1 mg  1 mg Oral Q8H PRN Davonna Belling, MD      . ondansetron [facility]) tablet 4 mg  4 mg Oral Q8H PRN Davonna Belling, MD      . QUEtiapine (SEROQUEL) tablet 400 mg  400 mg Oral QHS Davonna Belling, MD   400 mg at 10/12/14 2127  . sertraline (ZOLOFT) tablet 75 mg  75 mg Oral Daily Davonna Belling, MD   75 mg at 10/14/14 7017   Current Outpatient Prescriptions  Medication Sig Dispense Refill  . QUEtiapine (SEROQUEL) 400 MG tablet Take 1 tablet (400 mg total) by mouth at bedtime. For mood control. (Patient not taking: Reported on 10/12/2014) 30 tablet 0  . sertraline (ZOLOFT) 25 MG tablet Take 3 tablets (75 mg total) by mouth daily. For depression. (Patient not taking: Reported on 10/12/2014) 90 tablet 0    Musculoskeletal: Strength & Muscle Tone: unable to assess; patient seen via tele-psychiatry Gait & Station: unable to assess; patient seen via tele-psychiatry Patient leans: unable to assess; patient seen via tele-psychiatry  Psychiatric Specialty Exam:     Blood pressure 111/46, pulse 80, temperature 97.6 F (36.4 C), temperature source Oral, resp. rate 20,  height 5\' 3"  (1.6 m), SpO2 98 %.There is no weight on file to calculate BMI.  General Appearance: Fairly Groomed  Engineer, water::  Fair  Speech:  Clear and Coherent and Pressured  Volume:  Normal  Mood:  Angry and Irritable  Affect:  Labile  Thought Process:  Circumstantial  Orientation:  Full (Time, Place, and Person)  Thought Content:  Hallucinations: Auditory  Suicidal Thoughts:  No  Homicidal Thoughts:  Yes.  with intent/plan  Memory:  Immediate;   Good Recent;   Fair Remote;   Fair  Judgement:  Poor  Insight:  Lacking  Psychomotor Activity:  Normal  Concentration:  Fair  Recall:  [employer] of Knowledge:Fair  Language: Good  Akathisia:  No  Handed:  Right  AIMS (if indicated):     Assets:  Communication Skills Desire for Improvement  ADL's:  Intact  Cognition: WNL  Sleep:      Medical Decision Making: Review of Psycho-Social Stressors (1), Review or order clinical lab tests (1), Established Problem, Worsening (2),  Review or order medicine tests (1) and Review of Medication Regimen & Side Effects (2)   Plan:  Recommend psychiatric Inpatient admission when medically cleared.  Disposition: Continue to seek inpatient placement.   Serena Colonel, FNP-BC [place] 10/14/2014 2:38 PM

## 2014-10-14 NOTE — ED Notes (Signed)
PT REFUSED 6pm VITALS

## 2014-10-14 NOTE — Progress Notes (Signed)
CSW completed verbal intake with [facility] admissions. They confirmed that referral packet has been received. They report that the nurse will contact if there are any other questions.   [facility] authorization: #[id number]  [name], [place] 10/14/2014 3:22 PM

## 2014-10-14 NOTE — ED Notes (Signed)
TTS MACHINE MOVED INTO ROOM FOR PSYCH REEVAL

## 2014-10-14 NOTE — ED Notes (Signed)
Pt resting with eyes closed; respirations even and unlabored. Sitter at bedside

## 2014-10-14 NOTE — ED Notes (Signed)
Pt refusing 2200 medications; sts "I've taken meds like 5 times today. I'm not trying to stay doped up. I ain't taking them."

## 2014-10-14 NOTE — Progress Notes (Signed)
Patient on the [facility] waitlist.  CSW will continue to follow up.  Verlon Setting, [place] Disposition staff 10/14/2014 9:20 PM

## 2014-10-15 NOTE — ED Notes (Signed)
Spoke w/Faith-Marie, Agricultural consultant at [employer], re: transporting pt to psych ED x 2 - to call me back.

## 2014-10-15 NOTE — ED Notes (Signed)
Justin Chaney approved for pt to be transported to [place] ED.

## 2014-10-15 NOTE — ED Notes (Signed)
Pt resting quietly in bed with eyes closed. Respirations are even and unlabored. No acute distress noted. Safety has been maintained with q15 minute observation. Will continue current POC

## 2014-10-15 NOTE — Progress Notes (Signed)
Per admission staff at [facility], [place] remains on waitlist. Sharren Bridge MSW, [facility] Disposition Social Worker 10/15/2014

## 2014-10-15 NOTE — ED Notes (Signed)
Patient calm and cooperative at this time. Patient denies SI, HI and AVH at this time. Patient requesting to take shower at this time. Shower material given to patient . Encouragement and support provided and safety maintain. Q 15 minute safety checks remain in place.

## 2014-10-15 NOTE — ED Notes (Addendum)
Pt resting quietly in bed with eyes closed. Respirations are even and unlabored. No acute distress noted. writer woke pt to ask about taking his meds. Pt refused stating, "Im not taking any meds" Safety has been maintained with q15 minute observation. Will continue current POC

## 2014-10-15 NOTE — ED Notes (Signed)
Transfer from [facility]. He arrived in no acute distress. He is calm and cooperative with assessment. States he feels like his fuse is getting shorter and his meds dont seem to be working for him. Currently denies SI/HI/AVH and contracts for safety. Q15 minute observation initiated for safety. Will cont current POC

## 2014-10-16 DIAGNOSIS — R45850 Homicidal ideations: Secondary | ICD-10-CM | POA: Diagnosis not present

## 2014-10-16 DIAGNOSIS — R443 Hallucinations, unspecified: Secondary | ICD-10-CM | POA: Diagnosis not present

## 2014-10-16 DIAGNOSIS — R45851 Suicidal ideations: Secondary | ICD-10-CM

## 2014-10-16 DIAGNOSIS — F3164 Bipolar disorder, current episode mixed, severe, with psychotic features: Secondary | ICD-10-CM | POA: Diagnosis not present

## 2014-10-16 MED ORDER — ZIPRASIDONE MESYLATE 20 MG IM SOLR
20.0000 mg | Freq: Once | INTRAMUSCULAR | Status: AC
  Administered 2014-10-16: 20 mg via INTRAMUSCULAR
  Filled 2014-10-16: qty 20

## 2014-10-16 MED ORDER — DIPHENHYDRAMINE HCL 50 MG/ML IJ SOLN
50.0000 mg | Freq: Once | INTRAMUSCULAR | Status: AC
  Administered 2014-10-16: 50 mg via INTRAMUSCULAR
  Filled 2014-10-16: qty 1

## 2014-10-16 MED ORDER — LORAZEPAM 2 MG/ML IJ SOLN
2.0000 mg | Freq: Once | INTRAMUSCULAR | Status: AC
  Administered 2014-10-16: 2 mg via INTRAMUSCULAR
  Filled 2014-10-16: qty 1

## 2014-10-16 NOTE — BH Assessment (Signed)
[place] Assessment Progress Note  Per [place] at [facility] at 13:03, pt remains on wait list.  Jalene Mullet, MA Triage Specialist 10/16/2014 @ 13:03

## 2014-10-16 NOTE — ED Notes (Signed)
Patient continues to pace hallways and trying to go into other patient;s room. Patient told Vanita, MHT " You don't know what's going on in my head someone going to get hurt tonight".

## 2014-10-16 NOTE — Progress Notes (Signed)
  CARE MANAGEMENT ED NOTE 10/16/2014  Patient:  Justin Chaney, Justin Chaney   Account Number:  [id number]  Date Initiated:  10/16/2014  Documentation initiated by:  Jackelyn Poling  Subjective/Objective Assessment:   19 yr old self pay [place] pt transferred to [facility] from [facility] on 01/15/15 Has shunt left side of neck & scalp positive for amphetamines etoh negative dx psychosis with recommended inpatient admission     Subjective/Objective Assessment Detail:   no pcp     Action/Plan:   pt discussed in SAPPU progression Pt pending for [place]  ? pt on waitlist for3/16/16 Pt without pcp listed Pt provided self pay [employer] resources   Action/Plan Detail:   Anticipated DC Date:       Status Recommendation to Physician:   Result of Recommendation:    Other ED Services  Consult Working Production assistant, radio  Other  Outpatient Services - Pt will follow up  PCP issues    Choice offered to / List presented to:            Status of service:  Completed, signed off  ED Comments:   ED Comments Detail:  CM spoke with pt who confirms self pay [facility] resident with no pcp. CM discussed and provided written information for self pay pcps, importance of pcp for f/u care, www.needymeds.org, www.goodrx.com, discounted pharmacies and other [employer] such as [employer], affordable care act,  financial assistance, DSS and  health department  Reviewed resources for [employer] self pay pcps like Jinny Blossom, family medicine at [address], [facility] family practice, general medical clinics, [facility] urgent care plus others, medication resources, [facility] out patient pharmacies and housing Pt voiced understanding and appreciation of resources provided  Provided [facility] contact information Pt does not want a referral Pt preoccupied with her peer across the hallway from her talking loudly "She is getting on my nerve. I'm going to kill her" Peer apologized but pt still voiced angry comments to  peer CM notified SAPPU RN of pt behavior and comments
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
## 2014-10-16 NOTE — ED Notes (Signed)
Patient pacing hallways at this time. Patient wrote on sign box : FUCK YOU I'M [employer] [employer].

## 2014-10-16 NOTE — ED Notes (Signed)
Acuity: calm and cooperative.

## 2014-10-16 NOTE — ED Notes (Signed)
Patriciaann Clan, PA contacted and informed of patient's behavior and verbal orders received and read back.

## 2014-10-17 DIAGNOSIS — F3164 Bipolar disorder, current episode mixed, severe, with psychotic features: Secondary | ICD-10-CM | POA: Diagnosis present

## 2014-10-17 MED ORDER — OLANZAPINE 10 MG PO TBDP
10.0000 mg | ORAL_TABLET | Freq: Three times a day (TID) | ORAL | Status: DC | PRN
Start: 2014-10-17 — End: 2014-10-18

## 2014-10-17 NOTE — ED Notes (Signed)
Acuity level high.  Patient is on the [facility] wait list and his behavior is unpredictable.
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
## 2014-10-17 NOTE — Progress Notes (Signed)
Per Ishmael Holter, patient remains on [facility] waitlist.   Belia Heman, [place] Work  [employer] [phone]

## 2014-10-17 NOTE — ED Notes (Signed)
Patient up and about on the unit.  Has been in the dayroom interacting with peers.  States he does not know why he is here and wants to be discharged in the morning.  Has been refusing all medications, states they do not work.  Acuity level remains high, patient is on [facility] wait list.

## 2014-10-17 NOTE — ED Notes (Signed)
Pt AAO x 3, sitting in dayroom, freely conversing with others, will continue to monitor for safety.  q15 min checks in place.

## 2014-10-17 NOTE — ED Notes (Signed)
Acuity level high, patient is on the list for [facility] and behavior is unpredictable.

## 2014-10-18 DIAGNOSIS — F3164 Bipolar disorder, current episode mixed, severe, with psychotic features: Secondary | ICD-10-CM

## 2014-10-18 MED ORDER — CARBAMAZEPINE 200 MG PO TABS: 200.0000 mg | ORAL_TABLET | Freq: Two times a day (BID) | ORAL | Status: DC

## 2014-10-18 MED ORDER — QUETIAPINE FUMARATE 400 MG PO TABS
400.0000 mg | ORAL_TABLET | Freq: Every day | ORAL | Status: DC
Start: 2014-10-18 — End: 2014-12-16

## 2014-10-18 MED ORDER — GABAPENTIN 300 MG PO CAPS: 300.0000 mg | ORAL_CAPSULE | Freq: Three times a day (TID) | ORAL | Status: DC

## 2014-10-18 NOTE — Progress Notes (Signed)
CSW confirmed with Richardson Landry at [facility] that pt remains on waitlist.  Sharren Bridge, LCSWA 08:30

## 2014-10-18 NOTE — BH Assessment (Signed)
[place] Assessment Progress Note  Per Mojeed Akintayo, this pt no longer requires psychiatric hospitalization.  He is to be released from petition and discharged from [facility] with outpatient referrals.  IVC has been rescinded.  Pt's record shows that he has been a client of [facility] in the past.  After having pt sign Consent to Release Information to them, I called them and discovered that he is not a current client of theirs, and that they would not be able to accept him as a client under [facility] funding at this time.  Pt's discharge instructions include referral information for [facility].  Pt's nurse, Randall Hiss, has been notified.  Jalene Mullet, MA Triage Specialist 10/18/2014 @ 11:38
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
## 2014-10-18 NOTE — ED Notes (Signed)
Pt resting quietly in bed with eyes closed. Respirations are even and unlabored. No acute distress noted. Safety has been maintained with q15 minute observation. Will continue current POC

## 2014-10-18 NOTE — Discharge Instructions (Signed)
For your ongoing mental health needs, you are advised to follow up with [facility].  New and returning patients are seen at their walk-in clinic.  Walk-in hours are Monday - Friday from 8:00 am - 3:00 pm.  Walk-in patients are seen on a first come, first served basis.  Try to arrive as early as possible for he best chance of being seen the same day: ° °     [facility] °     [address] °     [place], [place] [address] °     [phone] °

## 2014-12-08 ENCOUNTER — Encounter (HOSPITAL_COMMUNITY): Payer: Self-pay | Admitting: Emergency Medicine

## 2014-12-08 ENCOUNTER — Emergency Department (HOSPITAL_COMMUNITY): Payer: BLUE CROSS/BLUE SHIELD

## 2014-12-08 ENCOUNTER — Emergency Department (HOSPITAL_COMMUNITY)
Admission: EM | Admit: 2014-12-08 | Discharge: 2014-12-09 | Disposition: A | Discharge status: Home / Self Care | Payer: BLUE CROSS/BLUE SHIELD | Attending: Emergency Medicine | Admitting: Emergency Medicine

## 2014-12-08 DIAGNOSIS — Z79899 Other long term (current) drug therapy: Secondary | ICD-10-CM | POA: Insufficient documentation

## 2014-12-08 DIAGNOSIS — S20212A Contusion of left front wall of thorax, initial encounter: Secondary | ICD-10-CM | POA: Insufficient documentation

## 2014-12-08 DIAGNOSIS — Z720 Tobacco use: Secondary | ICD-10-CM | POA: Insufficient documentation

## 2014-12-08 DIAGNOSIS — S0993XA Unspecified injury of face, initial encounter: Secondary | ICD-10-CM | POA: Diagnosis present

## 2014-12-08 DIAGNOSIS — Z8669 Personal history of other diseases of the nervous system and sense organs: Secondary | ICD-10-CM | POA: Insufficient documentation

## 2014-12-08 DIAGNOSIS — S0012XA Contusion of left eyelid and periocular area, initial encounter: Secondary | ICD-10-CM | POA: Diagnosis not present

## 2014-12-08 DIAGNOSIS — Y998 Other external cause status: Secondary | ICD-10-CM | POA: Diagnosis not present

## 2014-12-08 DIAGNOSIS — J45909 Unspecified asthma, uncomplicated: Secondary | ICD-10-CM | POA: Diagnosis not present

## 2014-12-08 DIAGNOSIS — F319 Bipolar disorder, unspecified: Secondary | ICD-10-CM | POA: Diagnosis not present

## 2014-12-08 DIAGNOSIS — Y9289 Other specified places as the place of occurrence of the external cause: Secondary | ICD-10-CM | POA: Insufficient documentation

## 2014-12-08 DIAGNOSIS — Y9389 Activity, other specified: Secondary | ICD-10-CM | POA: Diagnosis not present

## 2014-12-08 DIAGNOSIS — G40909 Epilepsy, unspecified, not intractable, without status epilepticus: Secondary | ICD-10-CM | POA: Insufficient documentation

## 2014-12-08 DIAGNOSIS — Z880 Allergy status to penicillin: Secondary | ICD-10-CM | POA: Diagnosis not present

## 2014-12-08 DIAGNOSIS — S0083XA Contusion of other part of head, initial encounter: Secondary | ICD-10-CM

## 2014-12-08 MED ORDER — MORPHINE SULFATE 4 MG/ML IJ SOLN: 4.0000 mg | Freq: Once | INTRAMUSCULAR | Status: DC

## 2014-12-08 MED ORDER — MORPHINE SULFATE 4 MG/ML IJ SOLN
4.0000 mg | Freq: Once | INTRAMUSCULAR | Status: AC
Start: 2014-12-09 — End: 2014-12-09
  Administered 2014-12-09: 4 mg via INTRAMUSCULAR
  Filled 2014-12-08: qty 1
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
## 2014-12-12 ENCOUNTER — Ambulatory Visit (HOSPITAL_COMMUNITY)
Admission: AD | Admit: 2014-12-12 | Discharge: 2014-12-12 | Disposition: A | Discharge status: Home / Self Care | Payer: BLUE CROSS/BLUE SHIELD | Attending: Psychiatry | Admitting: Psychiatry

## 2014-12-13 ENCOUNTER — Emergency Department (HOSPITAL_COMMUNITY)
Admission: EM | Admit: 2014-12-13 | Discharge: 2014-12-16 | Disposition: A | Discharge status: Home / Self Care | Payer: BLUE CROSS/BLUE SHIELD | Attending: Emergency Medicine | Admitting: Emergency Medicine

## 2014-12-13 ENCOUNTER — Encounter (HOSPITAL_COMMUNITY): Payer: Self-pay | Admitting: Emergency Medicine

## 2014-12-13 DIAGNOSIS — Z720 Tobacco use: Secondary | ICD-10-CM | POA: Diagnosis not present

## 2014-12-13 DIAGNOSIS — F3164 Bipolar disorder, current episode mixed, severe, with psychotic features: Secondary | ICD-10-CM | POA: Diagnosis present

## 2014-12-13 DIAGNOSIS — Z982 Presence of cerebrospinal fluid drainage device: Secondary | ICD-10-CM | POA: Insufficient documentation

## 2014-12-13 DIAGNOSIS — Z880 Allergy status to penicillin: Secondary | ICD-10-CM | POA: Diagnosis not present

## 2014-12-13 DIAGNOSIS — J45909 Unspecified asthma, uncomplicated: Secondary | ICD-10-CM | POA: Diagnosis not present

## 2014-12-13 DIAGNOSIS — F319 Bipolar disorder, unspecified: Secondary | ICD-10-CM | POA: Insufficient documentation

## 2014-12-13 DIAGNOSIS — R45850 Homicidal ideations: Secondary | ICD-10-CM

## 2014-12-13 DIAGNOSIS — G40909 Epilepsy, unspecified, not intractable, without status epilepticus: Secondary | ICD-10-CM | POA: Diagnosis not present

## 2014-12-13 DIAGNOSIS — Z79899 Other long term (current) drug therapy: Secondary | ICD-10-CM | POA: Insufficient documentation

## 2014-12-13 LAB — COMPREHENSIVE METABOLIC PANEL
ALT: 21 U/L (ref 17–63)
AST: 25 U/L (ref 15–41)
Albumin: 4.1 g/dL (ref 3.5–5.0)
Alkaline Phosphatase: 75 U/L (ref 38–126)
Anion gap: 5 (ref 5–15)
BUN: 15 mg/dL (ref 6–20)
CO2: 25 mmol/L (ref 22–32)
Calcium: 9.2 mg/dL (ref 8.9–10.3)
Chloride: 106 mmol/L (ref 101–111)
Creatinine, Ser: 0.71 mg/dL (ref 0.61–1.24)
GFR calc Af Amer: >60 mL/min (ref >60)
GFR calc non Af Amer: >60 mL/min (ref >60)
Glucose, Bld: 124 mg/dL — ABNORMAL HIGH (ref 65–99)
Potassium: 3.7 mmol/L (ref 3.5–5.1)
Sodium: 136 mmol/L (ref 135–145)
Total Bilirubin: 0.5 mg/dL (ref 0.3–1.2)
Total Protein: 6.9 g/dL (ref 6.5–8.1)

## 2014-12-13 LAB — RAPID URINE DRUG SCREEN, HOSP PERFORMED
Amphetamines: NOT DETECTED
Barbiturates: NOT DETECTED
Benzodiazepines: NOT DETECTED
Cocaine: NOT DETECTED
Opiates: NOT DETECTED
Tetrahydrocannabinol: NOT DETECTED

## 2014-12-13 LAB — ETHANOL: Alcohol, Ethyl (B): <5 mg/dL (ref <5)

## 2014-12-13 LAB — CBC
HCT: 41 % (ref 39.0–52.0)
Hemoglobin: 13.5 g/dL (ref 13.0–17.0)
MCH: 27.7 pg (ref 26.0–34.0)
MCHC: 32.9 g/dL (ref 30.0–36.0)
MCV: 84.2 fL (ref 78.0–100.0)
Platelets: 263 10*3/uL (ref 150–400)
RBC: 4.87 MIL/uL (ref 4.22–5.81)
RDW: 12.5 % (ref 11.5–15.5)
WBC: 7.7 10*3/uL (ref 4.0–10.5)

## 2014-12-13 LAB — SALICYLATE LEVEL: Salicylate Lvl: <4 mg/dL (ref 2.8–30.0)

## 2014-12-13 LAB — ACETAMINOPHEN LEVEL: Acetaminophen (Tylenol), Serum: <10 ug/mL — ABNORMAL LOW (ref 10–30)

## 2014-12-13 MED ORDER — GABAPENTIN 300 MG PO CAPS
300.0000 mg | ORAL_CAPSULE | Freq: Three times a day (TID) | ORAL | Status: DC
  Administered 2014-12-13 – 2014-12-16 (×3): 300 mg via ORAL
  Filled 2014-12-13 (×5): qty 1

## 2014-12-13 MED ORDER — LORAZEPAM 1 MG PO TABS: 1.0000 mg | ORAL_TABLET | Freq: Three times a day (TID) | ORAL | Status: DC | PRN

## 2014-12-13 MED ORDER — ACETAMINOPHEN 325 MG PO TABS: 650.0000 mg | ORAL_TABLET | ORAL | Status: DC | PRN

## 2014-12-13 MED ORDER — IBUPROFEN 200 MG PO TABS: 600.0000 mg | ORAL_TABLET | Freq: Three times a day (TID) | ORAL | Status: DC | PRN

## 2014-12-13 MED ORDER — FLUPHENAZINE HCL 5 MG PO TABS
5.0000 mg | ORAL_TABLET | Freq: Two times a day (BID) | ORAL | Status: DC
  Administered 2014-12-13 – 2014-12-16 (×3): 5 mg via ORAL
  Filled 2014-12-13 (×7): qty 1

## 2014-12-13 MED ORDER — AMANTADINE HCL 100 MG PO CAPS
100.0000 mg | ORAL_CAPSULE | Freq: Two times a day (BID) | ORAL | Status: DC
  Administered 2014-12-13 – 2014-12-16 (×3): 100 mg via ORAL
  Filled 2014-12-13 (×7): qty 1

## 2014-12-13 MED ORDER — CARBAMAZEPINE 200 MG PO TABS
200.0000 mg | ORAL_TABLET | Freq: Two times a day (BID) | ORAL | Status: DC
  Administered 2014-12-13 – 2014-12-16 (×3): 200 mg via ORAL
  Filled 2014-12-13 (×7): qty 1
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
## 2014-12-13 NOTE — ED Notes (Signed)
Pt. Noted in day room. No complaints or concerns voiced. No distress or abnormal behavior noted. Will continue to monitor with security cameras. Q 15 minute rounds continue.

## 2014-12-13 NOTE — ED Notes (Signed)
Report received from [employer]. Pt. Alert and oriented in no distress denies SI and pain.  Pt. Still c/o HI towards people in general and that he hears voices and sees thing intermittantly without command. Pt. Instructed to come to me with problems or concerns.Will continue to monitor for safety via security cameras and Q 15 minute checks.

## 2014-12-13 NOTE — Consult Note (Signed)
[place] Psychiatry Consult   Reason for Consult:  Bipolar affective disorder, Psychosis, homicidal ideation. Referring Physician:  EDP Patient Identification: Justin Chaney MRN:  [id number] Principal Diagnosis: Bipolar affective disorder, mixed, severe, with psychotic behavior Diagnosis:   Patient Active Problem List   Diagnosis Date Noted  . Bipolar affective disorder, mixed, severe, with psychotic behavior [F31.64] 10/17/2014    Priority: High  . Substance induced mood disorder [F19.94] 02/08/2014  . Suicidal ideations [R45.851] 12/31/2013  . Homicidal ideations [R45.850] 12/31/2013  . 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 Cannabis abuse [F12.10] 08/15/2012  . 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 male patient admitted with Bipolar affective disorder,Psychosis., Homicidal ideation.Marland Kitchen  HPI:  AA male, 19 years old was evaluated for cannibalistic thought wanting to eat people.  He also reported that he is having thoughts of wanting to kill his mother and grand mother.  Patient reports that he sees things and hears things that people are not seeing or hearing.  He reports poor sleep and appetite.  Patient was hospitalized in [facility]  and  ER back in March for same symptoms.  Patient reports that his medications are not effective and that he stopped taking it.  Patient stated that he decided to come into the hospital to avoid his symptoms escalating.  Patient denies SI at this time.  He has been accepted for admission and we will be seeking placement at a facility with available inpatient Psychiatric beds.  HPI Elements:   Location:  Bipolar disorder, mixed with Psychotic features, homicidal ideation,. Quality:  severe, having Cannabalistic thought wanting to eat people, thoughts of wanting to kill his mother and grand mom.. Severity:   severe. Timing:  Acute. Duration:  Chronic Mental illness.. Context:  seeking treatment for mental illness..  Past Medical History:  Past Medical History  Diagnosis Date  . Asthma   . Seizures   . Brain ventricular shunt obstruction     hydrochelpis  . Depression   . Bipolar disorder     Past Surgical History  Procedure Laterality Date  . Tonsillectomy    . Ventriculo-peritoneal shunt placement / laparoscopic insertion peritoneal catheter     Family History: History reviewed. No pertinent family history. Social History:  History  Alcohol Use  . 7.2 oz/week  . 12 Cans of beer per week    Comment: 4-8 40 oz.on the weekend     History  Drug Use  . Yes  . Special: Marijuana, Other-see comments, Amphetamines    Comment: Adderall, "I don't do weed, only rarely"    History   Social History  . Marital Status: Single    Spouse Name: N/A  . Number of Children: N/A  . Years of Education: N/A   Social History Main Topics  . Smoking status: Current Every Day Smoker -- 1.00 packs/day    Types: Cigarettes  . Smokeless tobacco: Not on file  . Alcohol Use: 7.2 oz/week    12 Cans of beer per week     Comment: 4-8 40 oz.on the weekend  . Drug Use: Yes    Special: Marijuana, Other-see comments, Amphetamines     Comment: Adderall, "I don't do weed, only rarely"  . Sexual Activity: Not on file   Other Topics Concern  . None   Social History Narrative   Additional Social History:  Allergies:   Allergies  Allergen Reactions  . Amoxicillin Hives  . Penicillins Hives and Rash    Labs:  Results for orders placed or performed during the hospital encounter of 12/13/14 (from the past 48 hour(s))  Acetaminophen level     Status: Abnormal   Collection Time: 12/13/14  1:09 AM  Result Value Ref Range   Acetaminophen (Tylenol), Serum <10 (L) 10 - 30 ug/mL    Comment:        THERAPEUTIC CONCENTRATIONS VARY SIGNIFICANTLY. A RANGE OF  10-30 ug/mL MAY BE AN EFFECTIVE CONCENTRATION FOR MANY PATIENTS. HOWEVER, SOME ARE BEST TREATED AT CONCENTRATIONS OUTSIDE THIS RANGE. ACETAMINOPHEN CONCENTRATIONS >150 ug/mL AT 4 HOURS AFTER INGESTION AND >50 ug/mL AT 12 HOURS AFTER INGESTION ARE OFTEN ASSOCIATED WITH TOXIC REACTIONS.   CBC     Status: None   Collection Time: 12/13/14  1:09 AM  Result Value Ref Range   WBC 7.7 4.0 - 10.5 K/uL   RBC 4.87 4.22 - 5.81 MIL/uL   Hemoglobin 13.5 13.0 - 17.0 g/dL   HCT 41.0 39.0 - 52.0 %   MCV 84.2 78.0 - 100.0 fL   MCH 27.7 26.0 - 34.0 pg   MCHC 32.9 30.0 - 36.0 g/dL   RDW 12.5 11.5 - 15.5 %   Platelets 263 150 - 400 K/uL  Comprehensive metabolic panel     Status: Abnormal   Collection Time: 12/13/14  1:09 AM  Result Value Ref Range   Sodium 136 135 - 145 mmol/L   Potassium 3.7 3.5 - 5.1 mmol/L   Chloride 106 101 - 111 mmol/L   CO2 25 22 - 32 mmol/L   Glucose, Bld 124 (H) 65 - 99 mg/dL   BUN 15 6 - 20 mg/dL   Creatinine, Ser 0.71 0.61 - 1.24 mg/dL   Calcium 9.2 8.9 - 10.3 mg/dL   Total Protein 6.9 6.5 - 8.1 g/dL   Albumin 4.1 3.5 - 5.0 g/dL   AST 25 15 - 41 U/L   ALT 21 17 - 63 U/L   Alkaline Phosphatase 75 38 - 126 U/L   Total Bilirubin 0.5 0.3 - 1.2 mg/dL   GFR calc non Af Amer >60 >60 mL/min   GFR calc Af Amer >60 >60 mL/min    Comment: (NOTE) The eGFR has been calculated using the CKD EPI equation. This calculation has not been validated in all clinical situations. eGFR's persistently <60 mL/min signify possible Chronic Kidney Disease.    Anion gap 5 5 - 15  Ethanol (ETOH)     Status: None   Collection Time: 12/13/14  1:09 AM  Result Value Ref Range   Alcohol, Ethyl (B) <5 <5 mg/dL    Comment:        LOWEST DETECTABLE LIMIT FOR SERUM ALCOHOL IS 11 mg/dL FOR MEDICAL PURPOSES ONLY   Salicylate level     Status: None   Collection Time: 12/13/14  1:09 AM  Result Value Ref Range   Salicylate Lvl <7.7 2.8 - 30.0 mg/dL  Urine Drug Screen     Status: None    Collection Time: 12/13/14  4:10 AM  Result Value Ref Range   Opiates NONE DETECTED NONE DETECTED   Cocaine NONE DETECTED NONE DETECTED   Benzodiazepines NONE DETECTED NONE DETECTED   Amphetamines NONE DETECTED NONE DETECTED   Tetrahydrocannabinol NONE DETECTED NONE DETECTED   Barbiturates NONE DETECTED NONE DETECTED    Comment:        DRUG SCREEN FOR MEDICAL PURPOSES ONLY.  IF CONFIRMATION IS NEEDED FOR ANY PURPOSE, NOTIFY LAB WITHIN 5 DAYS.        LOWEST DETECTABLE LIMITS FOR URINE DRUG SCREEN Drug Class       Cutoff (ng/mL) Amphetamine      1000 Barbiturate      200 Benzodiazepine   825 Tricyclics       003 Opiates          300 Cocaine          300 THC              50     Vitals: Blood pressure 118/51, pulse 73, temperature 98.1 F (36.7 C), temperature source Oral, resp. rate 16, SpO2 100 %.  Risk to Self: Is patient at risk for suicide?: No, but patient needs Medical Clearance Risk to Others:   Prior Inpatient Therapy:   Prior Outpatient Therapy:    Current Facility-Administered Medications  Medication Dose Route Frequency Provider Last Rate Last Dose  . acetaminophen (TYLENOL) tablet 650 mg  650 mg Oral Q4H PRN April Palumbo, MD      . ibuprofen (ADVIL,MOTRIN) tablet 600 mg  600 mg Oral Q8H PRN April Palumbo, MD      . LORazepam (ATIVAN) tablet 1 mg  1 mg Oral Q8H PRN April Palumbo, MD       Current Outpatient Prescriptions  Medication Sig Dispense Refill  . carbamazepine (TEGRETOL) 200 MG tablet Take 1 tablet (200 mg total) by mouth 2 (two) times daily. (Patient not taking: Reported on 12/08/2014) 60 tablet 0  . gabapentin (NEURONTIN) 300 MG capsule Take 1 capsule (300 mg total) by mouth 3 (three) times daily. (Patient not taking: Reported on 12/08/2014) 90 capsule 0  . QUEtiapine (SEROQUEL) 400 MG tablet Take 1 tablet (400 mg total) by mouth at bedtime. (Patient not taking: Reported on 12/08/2014) 30 tablet 0    ROS is negative except for childhood brain shunt for  hydrocephalus.  Musculoskeletal: Strength & Muscle Tone: within normal limits Gait & Station: normal Patient leans: N/A  Psychiatric Specialty Exam:     Blood pressure 118/51, pulse 73, temperature 98.1 F (36.7 C), temperature source Oral, resp. rate 16, SpO2 100 %.There is no weight on file to calculate BMI.  General Appearance: Casual and Fairly Groomed  Eye Contact::  Good  Speech:  Clear and Coherent and Normal Rate  Volume:  Normal  Mood:  Anxious and Depressed  Affect:  Congruent and Depressed  Thought Process:  Coherent, Goal Directed and Intact  Orientation:  Full (Time, Place, and Person)  Thought Content:  Hallucinations: Auditory Visual  Suicidal Thoughts:  No  Homicidal Thoughts:  Yes.  without intent/plan  Memory:  Immediate;   Good Recent;   Good Remote;   Good  Judgement:  Poor  Insight:  Good  Psychomotor Activity:  Psychomotor Retardation  Concentration:  Good  Recall:  NA  Fund of Knowledge:Good  Language: Good  Akathisia:  NA  Handed:  Right  AIMS (if indicated):     Assets:  Desire for Improvement Housing  ADL's:  Intact  Cognition: WNL  Sleep:      Medical Decision Making: Review of Psycho-Social Stressors (1), Established Problem, Worsening (2), Review of Medication Regimen & Side Effects (2) and Review of New Medication or Change in Dosage (2)  Treatment Plan Summary: Start Tegretol 200 mg po bid for mood stabilization/Bipolar disorder, Prolixin 5 mg po bid for Psychosis, Amantadine 100 mg twice a day for EDP,Gabapentin 300 mg  po tid for agitation and aggression, Ativan 1 mg po every 8 hours as needed for anxiety, Trazodone 50 mg po at bed time for sleep.  Plan:  Recommend psychiatric Inpatient admission when medically cleared. Disposition: see above  Delfin Gant   PMHNP-BC 12/13/2014 3:20 PM Patient seen face-to-face for psychiatric evaluation, chart reviewed and case discussed with the physician extender and developed treatment  plan. Reviewed the information documented and agree with the treatment plan. Corena Pilgrim, MD

## 2014-12-13 NOTE — BH Assessment (Signed)
[facility] Assessment Progress Note  The following facilities have been contacted to seek placement for this patient, with results as noted:  Beds available, information faxed, decision pending:  [facility]  At capacity:  [facility]  Left voice mail message, awaiting response:  [facility]  Jalene Mullet, [place] Triage Specialist [phone]

## 2014-12-13 NOTE — ED Notes (Addendum)
Pt brought in by [employer] voluntarily from [facility]  Pt has been assessed over there  Pt states he is hearing voices and seeing things that others cannot see  Pt states he wants to kill someone it does not matter who  Pt states the urge to kill someone is getting stronger every day  Pt states man, woman, or child it does not matter  Pt states he also wants to kill his mother and grandmother  He states he is just waiting for the right time  Pt states he is also having cannibalistic thoughts where he wants to eat peoples flesh and drink blood

## 2014-12-13 NOTE — ED Notes (Signed)
Pt. Noted sleeping in room. No complaints or concerns voiced. No distress or abnormal behavior noted. Will continue to monitor with security cameras. Q 15 minute rounds continue. 

## 2014-12-13 NOTE — ED Notes (Signed)
Pt changed into scrubs and wanded by security  

## 2014-12-13 NOTE — ED Notes (Signed)
Pt. Noted in room. No complaints or concerns voiced. No distress or abnormal behavior noted. Will continue to monitor with security cameras. Q 15 minute rounds continue. 

## 2014-12-13 NOTE — ED Provider Notes (Signed)
CSN: [id number]     Arrival date & time 12/13/14  0045 History   First MD Initiated Contact with Patient 12/13/14 0154     Chief Complaint  Patient presents with  . Homicidal  . Hallucinations     (Consider location/radiation/quality/duration/timing/severity/associated sxs/prior Treatment) Patient is a 19 y.o. male presenting with mental health disorder. The history is provided by medical records and the police. History limited by: refusing to talk to examined.  Mental Health Problem Presenting symptoms: aggressive behavior, hallucinations and homicidal ideas   Patient accompanied by:  Law enforcement Degree of incapacity (severity):  Severe Onset quality:  Unable to specify Timing:  Constant Progression:  Unable to specify Chronicity:  Recurrent Relieved by:  Nothing Risk factors: hx of mental illness and recent psychiatric admission     Past Medical History  Diagnosis Date  . Asthma   . Seizures   . Brain ventricular shunt obstruction     hydrochelpis  . Depression   . Bipolar disorder    Past Surgical History  Procedure Laterality Date  . Tonsillectomy    . Ventriculo-peritoneal shunt placement / laparoscopic insertion peritoneal catheter     History reviewed. No pertinent family history. History  Substance Use Topics  . Smoking status: Current Every Day Smoker -- 1.00 packs/day    Types: Cigarettes  . Smokeless tobacco: Not on file  . Alcohol Use: 7.2 oz/week    12 Cans of beer per week     Comment: 4-8 40 oz.on the weekend    Review of Systems  Unable to perform ROS Psychiatric/Behavioral: Positive for homicidal ideas and hallucinations.      Allergies  Amoxicillin and Penicillins  Home Medications   Prior to Admission medications   Medication Sig Start Date End Date Taking? Authorizing Provider  carbamazepine (TEGRETOL) 200 MG tablet Take 1 tablet (200 mg total) by mouth 2 (two) times daily. Patient not taking: Reported on 12/08/2014 10/18/14    Delfin Gant, NP  gabapentin (NEURONTIN) 300 MG capsule Take 1 capsule (300 mg total) by mouth 3 (three) times daily. Patient not taking: Reported on 12/08/2014 10/18/14   Delfin Gant, NP  QUEtiapine (SEROQUEL) 400 MG tablet Take 1 tablet (400 mg total) by mouth at bedtime. Patient not taking: Reported on 12/08/2014 10/18/14   Delfin Gant, NP   BP 126/70 mmHg  Pulse 86  Temp(Src) 98.3 F (36.8 C) (Oral)  Resp 16  SpO2 100% Physical Exam  Constitutional: He appears well-developed and well-nourished. No distress.  HENT:  Head: Normocephalic and atraumatic.  Right Ear: External ear normal.  Left Ear: External ear normal.  Nose: Nose normal.  Eyes: Conjunctivae and EOM are normal.  Neck: Normal range of motion. Neck supple.  Cardiovascular: Normal rate, regular rhythm and intact distal pulses.   Pulmonary/Chest: Effort normal and breath sounds normal. No respiratory distress. He has no wheezes. He has no rales.  Abdominal: Soft. Bowel sounds are normal. There is no tenderness. There is no rebound and no guarding.  Musculoskeletal: Normal range of motion.  Neurological: He is alert. He has normal reflexes.  Skin: Skin is warm and dry.  Psychiatric: He has a normal mood and affect.    ED Course  Procedures (including critical care time) Labs Review Labs Reviewed  ACETAMINOPHEN LEVEL - Abnormal; Notable for the following:    Acetaminophen (Tylenol), Serum <10 (*)    All other components within normal limits  COMPREHENSIVE METABOLIC PANEL - Abnormal; Notable for the following:  Glucose, Bld 124 (*)    All other components within normal limits  CBC  ETHANOL  SALICYLATE LEVEL  URINE RAPID DRUG SCREEN (HOSP PERFORMED)    Imaging Review No results found.   EKG Interpretation None      MDM   Final diagnoses:  None    Sent to be cleared by tts needs admission   Danyael Alipio, MD 12/13/14 0425

## 2014-12-14 DIAGNOSIS — R45850 Homicidal ideations: Secondary | ICD-10-CM | POA: Diagnosis not present

## 2014-12-14 DIAGNOSIS — F3164 Bipolar disorder, current episode mixed, severe, with psychotic features: Secondary | ICD-10-CM | POA: Diagnosis not present

## 2014-12-14 NOTE — ED Notes (Signed)
Upon approach with nightly medications, pt refused stating "I done told ya'll I ain't taking no more of that fucking medicine". Pt states he feels like a zombie after taking the meds and states "Even if I don't take it, ya'll can't keep me here forever". Pt laying in bed with his face to the wall, not looking at nurse.

## 2014-12-14 NOTE — ED Notes (Signed)
Pt. Noted sleeping in room. No complaints or concerns voiced. No distress or abnormal behavior noted. Will continue to monitor with security cameras. Q 15 minute rounds continue. 

## 2014-12-14 NOTE — ED Notes (Signed)
Pt is awake and alert, pt is irritable and flat, states he doesn't feel like taken medication and can I get the fuck out cause I am not answering any question. Will continue to monitor for safety. tlewis

## 2014-12-14 NOTE — Consult Note (Signed)
[facility] Consult   Reason for Consult:  Bipolar affective disorder, Psychosis, homicidal ideation. Referring Physician:  EDP Patient Identification: Justin Chaney MRN:  [id number] Principal Diagnosis: Bipolar affective disorder, mixed, severe, with psychotic behavior Diagnosis:   Patient Active Problem List   Diagnosis Date Noted  . Bipolar 1 disorder [F31.9]   . Bipolar affective disorder, mixed, severe, with psychotic behavior [F31.64] 10/17/2014  . Substance induced mood disorder [F19.94] 02/08/2014  . Suicidal ideations [R45.851] 12/31/2013  . Homicidal ideations [R45.850] 12/31/2013  . 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 Cannabis abuse [F12.10] 08/15/2012  . 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 male patient admitted with Bipolar affective disorder,Psychosis., Homicidal ideation. Pt seen and evaluated this AM by Dr. Aneta Mins and Charmaine Downs, NP. Pt continues to present with homicidal ideation to kill and eat people.   HPI:  AA male, 19 years old was evaluated for cannibalistic thought wanting to eat people.  He also reported that he is having thoughts of wanting to kill his mother and grand mother.  Patient reports that he sees things and hears things that people are not seeing or hearing.  He reports poor sleep and appetite.  Patient was hospitalized in [facility]  and  ER back in March for same symptoms.  Patient reports that his medications are not effective and that he stopped taking it.  Patient stated that he decided to come into the hospital to avoid his symptoms escalating.  Patient denies SI at this time.  He has been accepted for admission and we will be seeking placement at a facility with available inpatient Psychiatric beds.  HPI Elements:   Location:  Bipolar disorder, mixed with Psychotic features,  homicidal ideation,. Quality:  severe, having Cannabalistic thought wanting to eat people, thoughts of wanting to kill his mother and grand mom.. Severity:  severe. Timing:  Acute. Duration:  Chronic Mental illness.. Context:  seeking treatment for mental illness..  Past Medical History:  Past Medical History  Diagnosis Date  . Asthma   . Seizures   . Brain ventricular shunt obstruction     hydrochelpis  . Depression   . Bipolar disorder     Past Surgical History  Procedure Laterality Date  . Tonsillectomy    . Ventriculo-peritoneal shunt placement / laparoscopic insertion peritoneal catheter     Family History: History reviewed. No pertinent family history. Social History:  History  Alcohol Use  . 7.2 oz/week  . 12 Cans of beer per week    Comment: 4-8 40 oz.on the weekend     History  Drug Use  . Yes  . Special: Marijuana, Other-see comments, Amphetamines    Comment: Adderall, "I don't do weed, only rarely"    History   Social History  . Marital Status: Single    Spouse Name: N/A  . Number of Children: N/A  . Years of Education: N/A   Social History Main Topics  . Smoking status: Current Every Day Smoker -- 1.00 packs/day    Types: Cigarettes  . Smokeless tobacco: Not on file  . Alcohol Use: 7.2 oz/week    12 Cans of beer per week     Comment: 4-8 40 oz.on the weekend  . Drug Use: Yes    Special: Marijuana, Other-see comments, Amphetamines     Comment: Adderall, "I don't do weed, only rarely"  .  Sexual Activity: Not on file   Other Topics Concern  . None   Social History Narrative   Additional Social History:                          Allergies:   Allergies  Allergen Reactions  . Amoxicillin Hives  . Penicillins Hives and Rash    Labs:  Results for orders placed or performed during the hospital encounter of 12/13/14 (from the past 48 hour(s))  Acetaminophen level     Status: Abnormal   Collection Time: 12/13/14  1:09 AM  Result  Value Ref Range   Acetaminophen (Tylenol), Serum <10 (L) 10 - 30 ug/mL    Comment:        THERAPEUTIC CONCENTRATIONS VARY SIGNIFICANTLY. A RANGE OF 10-30 ug/mL MAY BE AN EFFECTIVE CONCENTRATION FOR MANY PATIENTS. HOWEVER, SOME ARE BEST TREATED AT CONCENTRATIONS OUTSIDE THIS RANGE. ACETAMINOPHEN CONCENTRATIONS >150 ug/mL AT 4 HOURS AFTER INGESTION AND >50 ug/mL AT 12 HOURS AFTER INGESTION ARE OFTEN ASSOCIATED WITH TOXIC REACTIONS.   CBC     Status: None   Collection Time: 12/13/14  1:09 AM  Result Value Ref Range   WBC 7.7 4.0 - 10.5 K/uL   RBC 4.87 4.22 - 5.81 MIL/uL   Hemoglobin 13.5 13.0 - 17.0 g/dL   HCT 41.0 39.0 - 52.0 %   MCV 84.2 78.0 - 100.0 fL   MCH 27.7 26.0 - 34.0 pg   MCHC 32.9 30.0 - 36.0 g/dL   RDW 12.5 11.5 - 15.5 %   Platelets 263 150 - 400 K/uL  Comprehensive metabolic panel     Status: Abnormal   Collection Time: 12/13/14  1:09 AM  Result Value Ref Range   Sodium 136 135 - 145 mmol/L   Potassium 3.7 3.5 - 5.1 mmol/L   Chloride 106 101 - 111 mmol/L   CO2 25 22 - 32 mmol/L   Glucose, Bld 124 (H) 65 - 99 mg/dL   BUN 15 6 - 20 mg/dL   Creatinine, Ser 0.71 0.61 - 1.24 mg/dL   Calcium 9.2 8.9 - 10.3 mg/dL   Total Protein 6.9 6.5 - 8.1 g/dL   Albumin 4.1 3.5 - 5.0 g/dL   AST 25 15 - 41 U/L   ALT 21 17 - 63 U/L   Alkaline Phosphatase 75 38 - 126 U/L   Total Bilirubin 0.5 0.3 - 1.2 mg/dL   GFR calc non Af Amer >60 >60 mL/min   GFR calc Af Amer >60 >60 mL/min    Comment: (NOTE) The eGFR has been calculated using the CKD EPI equation. This calculation has not been validated in all clinical situations. eGFR's persistently <60 mL/min signify possible Chronic Kidney Disease.    Anion gap 5 5 - 15  Ethanol (ETOH)     Status: None   Collection Time: 12/13/14  1:09 AM  Result Value Ref Range   Alcohol, Ethyl (B) <5 <5 mg/dL    Comment:        LOWEST DETECTABLE LIMIT FOR SERUM ALCOHOL IS 11 mg/dL FOR MEDICAL PURPOSES ONLY   Salicylate level     Status:  None   Collection Time: 12/13/14  1:09 AM  Result Value Ref Range   Salicylate Lvl <5.3 2.8 - 30.0 mg/dL  Urine Drug Screen     Status: None   Collection Time: 12/13/14  4:10 AM  Result Value Ref Range   Opiates NONE DETECTED NONE DETECTED   Cocaine NONE  DETECTED NONE DETECTED   Benzodiazepines NONE DETECTED NONE DETECTED   Amphetamines NONE DETECTED NONE DETECTED   Tetrahydrocannabinol NONE DETECTED NONE DETECTED   Barbiturates NONE DETECTED NONE DETECTED    Comment:        DRUG SCREEN FOR MEDICAL PURPOSES ONLY.  IF CONFIRMATION IS NEEDED FOR ANY PURPOSE, NOTIFY LAB WITHIN 5 DAYS.        LOWEST DETECTABLE LIMITS FOR URINE DRUG SCREEN Drug Class       Cutoff (ng/mL) Amphetamine      1000 Barbiturate      200 Benzodiazepine   263 Tricyclics       335 Opiates          300 Cocaine          300 THC              50     Vitals: Blood pressure 101/69, pulse 94, temperature 98.6 F (37 C), temperature source Oral, resp. rate 20, SpO2 100 %.  Risk to Self: Is patient at risk for suicide?: No, but patient needs Medical Clearance Risk to Others:   Prior Inpatient Therapy:   Prior Outpatient Therapy:    Current Facility-Administered Medications  Medication Dose Route Frequency Provider Last Rate Last Dose  . acetaminophen (TYLENOL) tablet 650 mg  650 mg Oral Q4H PRN April Palumbo, MD      . amantadine (SYMMETREL) capsule 100 mg  100 mg Oral BID Delfin Gant, NP   Stopped at 12/14/14 0910  . carbamazepine (TEGRETOL) tablet 200 mg  200 mg Oral BID Delfin Gant, NP   Stopped at 12/14/14 0911  . fluPHENAZine (PROLIXIN) tablet 5 mg  5 mg Oral BID Delfin Gant, NP   Stopped at 12/14/14 0912  . gabapentin (NEURONTIN) capsule 300 mg  300 mg Oral TID Delfin Gant, NP   Stopped at 12/14/14 0912  . ibuprofen (ADVIL,MOTRIN) tablet 600 mg  600 mg Oral Q8H PRN April Palumbo, MD      . LORazepam (ATIVAN) tablet 1 mg  1 mg Oral Q8H PRN April Palumbo, MD       Current  Outpatient Prescriptions  Medication Sig Dispense Refill  . carbamazepine (TEGRETOL) 200 MG tablet Take 1 tablet (200 mg total) by mouth 2 (two) times daily. (Patient not taking: Reported on 12/08/2014) 60 tablet 0  . gabapentin (NEURONTIN) 300 MG capsule Take 1 capsule (300 mg total) by mouth 3 (three) times daily. (Patient not taking: Reported on 12/08/2014) 90 capsule 0  . QUEtiapine (SEROQUEL) 400 MG tablet Take 1 tablet (400 mg total) by mouth at bedtime. (Patient not taking: Reported on 12/08/2014) 30 tablet 0    Musculoskeletal: Strength & Muscle Tone: within normal limits Gait & Station: normal Patient leans: N/A  Psychiatric Specialty Exam:   Review of Systems  Psychiatric/Behavioral: Positive for depression and hallucinations (delusional thinking about eating people). The patient is nervous/anxious.   All other systems reviewed and are negative.    Blood pressure 101/69, pulse 94, temperature 98.6 F (37 C), temperature source Oral, resp. rate 20, SpO2 100 %.There is no weight on file to calculate BMI.  General Appearance: Casual and Fairly Groomed  Eye Contact::  Good  Speech:  Clear and Coherent and Normal Rate  Volume:  Normal  Mood:  Anxious and Depressed  Affect:  Congruent and Depressed  Thought Process:  Coherent, Goal Directed and Intact  Orientation:  Full (Time, Place, and Person)  Thought Content:  Hallucinations: Auditory  Visual  Suicidal Thoughts:  No  Homicidal Thoughts:  Yes.  without intent/plan to kill others and eat them.   Memory:  Immediate;   Good Recent;   Good Remote;   Good  Judgement:  Poor  Insight:  Good  Psychomotor Activity:  Psychomotor Retardation  Concentration:  Good  Recall:  NA  Fund of Knowledge:Good  Language: Good  Akathisia:  NA  Handed:  Right  AIMS (if indicated):     Assets:  Desire for Improvement Housing  ADL's:  Intact  Cognition: WNL  Sleep:       Treatment Plan Summary: Bipolar affective disorder, mixed, severe,  with psychotic behavior unstable and being treated with: Tegretol 200 mg po bid for mood stabilization/Bipolar disorder, Prolixin 5 mg po bid for Psychosis, Amantadine 100 mg twice a day for EDP,Gabapentin 300 mg po tid for agitation and aggression, Ativan 1 mg po every 8 hours as needed for anxiety, Trazodone 50 mg po at bed time for sleep.  Disposition: Inpatient psychiatric hospitalization for safety and stabilization.   Benjamine Mola  FNP-BC 12/14/2014 5:20 PM

## 2014-12-14 NOTE — ED Notes (Signed)
Patient restless and pacing halls at beginning of shift. Staff asked pt if he wanted a snack or sandwich. Pt replied "I eat blood and flesh. You don't know what I eat.". Staff redirecting behaviors. Pt laughing and smiling while speaking about such things. Pt given sandwich and drink. No distress noted.

## 2014-12-14 NOTE — ED Notes (Signed)
Pt refused to have blood pressure taken. RN notified.

## 2014-12-15 DIAGNOSIS — R45850 Homicidal ideations: Secondary | ICD-10-CM | POA: Diagnosis not present

## 2014-12-15 DIAGNOSIS — F3164 Bipolar disorder, current episode mixed, severe, with psychotic features: Secondary | ICD-10-CM | POA: Diagnosis not present

## 2014-12-15 NOTE — Consult Note (Signed)
[facility] Consult   Reason for Consult:  Bipolar affective disorder, Psychosis, homicidal ideation. Referring Physician:  EDP Patient Identification: Justin Chaney MRN:  [id number] Principal Diagnosis: Bipolar affective disorder, mixed, severe, with psychotic behavior Diagnosis:   Patient Active Problem List   Diagnosis Date Noted  . Bipolar affective disorder, mixed, severe, with psychotic behavior [F31.64] 10/17/2014    Priority: High  . Bipolar 1 disorder [F31.9]   . Substance induced mood disorder [F19.94] 02/08/2014  . Suicidal ideations [R45.851] 12/31/2013  . Homicidal ideations [R45.850] 12/31/2013  . 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 Cannabis abuse [F12.10] 08/15/2012  . 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 male patient admitted with Bipolar affective disorder,Psychosis., Homicidal ideation. Pt seen and evaluated this AM by Dr. Aneta Mins and Charmaine Downs, NP. Pt continues to present with homicidal ideation to kill and eat people.   HPI:  AA male, 19 years old was evaluated for cannibalistic thought wanting to eat people.  He also reported that he is having thoughts of wanting to kill his mother and grand mother.  Patient reports that he sees things and hears things that people are not seeing or hearing.  He reports poor sleep and appetite.  Patient was hospitalized in [facility]  and  ER back in March for same symptoms.  Patient reports that his medications are not effective and that he stopped taking it.  Patient stated that he decided to come into the hospital to avoid his symptoms escalating.  Patient denies SI at this time.  He has been accepted for admission and we will be seeking placement at a facility with available inpatient Psychiatric beds.  12/15/14:  Patient is refusing all of his medications  stating that he does not need them  He also is voicing homicidal ideation stating that he want to eat people.  Patient leaves his room and knocks on the provider door asking to be discharged home.  He gets angry and irritable when asked about feeling suicide or homicidal and replies" how many times do you have to ask me same stupid  Questions"  We will continue to monitor patient and possibly initiate forced medications.  HPI Elements:   Location:  Bipolar disorder, mixed with Psychotic features, homicidal ideation,. Quality:  severe, having Cannabalistic thought wanting to eat people, thoughts of wanting to kill his mother and grand mom.. Severity:  severe. Timing:  Acute. Duration:  Chronic Mental illness.. Context:  seeking treatment for mental illness..  Past Medical History:  Past Medical History  Diagnosis Date  . Asthma   . Seizures   . Brain ventricular shunt obstruction     hydrochelpis  . Depression   . Bipolar disorder     Past Surgical History  Procedure Laterality Date  . Tonsillectomy    . Ventriculo-peritoneal shunt placement / laparoscopic insertion peritoneal catheter     Family History: History reviewed. No pertinent family history. Social History:  History  Alcohol Use  . 7.2 oz/week  . 12 Cans of beer per week    Comment: 4-8 40 oz.on the weekend     History  Drug Use  . Yes  . Special: Marijuana, Other-see comments, Amphetamines    Comment: Adderall, "I don't do weed, only rarely"    History   Social History  . Marital Status: Single    Spouse Name: N/A  .  Number of Children: N/A  . Years of Education: N/A   Social History Main Topics  . Smoking status: Current Every Day Smoker -- 1.00 packs/day    Types: Cigarettes  . Smokeless tobacco: Not on file  . Alcohol Use: 7.2 oz/week    12 Cans of beer per week     Comment: 4-8 40 oz.on the weekend  . Drug Use: Yes    Special: Marijuana, Other-see comments, Amphetamines     Comment: Adderall, "I  don't do weed, only rarely"  . Sexual Activity: Not on file   Other Topics Concern  . None   Social History Narrative   Additional Social History:    Allergies:   Allergies  Allergen Reactions  . Amoxicillin Hives  . Penicillins Hives and Rash    Labs:  No results found for this or any previous visit (from the past 48 hour(s)).  Vitals: Blood pressure 114/58, pulse 74, temperature 98.6 F (37 C), temperature source Oral, resp. rate 16, SpO2 100 %.  Risk to Self: Is patient at risk for suicide?: No, but patient needs Medical Clearance Risk to Others:   Prior Inpatient Therapy:   Prior Outpatient Therapy:    Current Facility-Administered Medications  Medication Dose Route Frequency Provider Last Rate Last Dose  . acetaminophen (TYLENOL) tablet 650 mg  650 mg Oral Q4H PRN April Palumbo, MD      . amantadine (SYMMETREL) capsule 100 mg  100 mg Oral BID Delfin Gant, NP   Stopped at 12/14/14 0910  . carbamazepine (TEGRETOL) tablet 200 mg  200 mg Oral BID Delfin Gant, NP   Stopped at 12/14/14 0911  . fluPHENAZine (PROLIXIN) tablet 5 mg  5 mg Oral BID Delfin Gant, NP   Stopped at 12/14/14 0912  . gabapentin (NEURONTIN) capsule 300 mg  300 mg Oral TID Delfin Gant, NP   Stopped at 12/14/14 0912  . ibuprofen (ADVIL,MOTRIN) tablet 600 mg  600 mg Oral Q8H PRN April Palumbo, MD      . LORazepam (ATIVAN) tablet 1 mg  1 mg Oral Q8H PRN April Palumbo, MD       Current Outpatient Prescriptions  Medication Sig Dispense Refill  . carbamazepine (TEGRETOL) 200 MG tablet Take 1 tablet (200 mg total) by mouth 2 (two) times daily. (Patient not taking: Reported on 12/08/2014) 60 tablet 0  . gabapentin (NEURONTIN) 300 MG capsule Take 1 capsule (300 mg total) by mouth 3 (three) times daily. (Patient not taking: Reported on 12/08/2014) 90 capsule 0  . QUEtiapine (SEROQUEL) 400 MG tablet Take 1 tablet (400 mg total) by mouth at bedtime. (Patient not taking: Reported on 12/08/2014)  30 tablet 0    Musculoskeletal: Strength & Muscle Tone: within normal limits Gait & Station: normal Patient leans: N/A  Psychiatric Specialty Exam:   ROS   Blood pressure 114/58, pulse 74, temperature 98.6 F (37 C), temperature source Oral, resp. rate 16, SpO2 100 %.There is no weight on file to calculate BMI.  General Appearance: Casual and Fairly Groomed  Eye Contact::  Good  Speech:  Clear and Coherent and Normal Rate  Volume:  Normal  Mood:  Anxious and Depressed  Affect:  Congruent and Depressed  Thought Process:  Coherent, Goal Directed and Intact  Orientation:  Full (Time, Place, and Person)  Thought Content:  Hallucinations: Auditory Visual  Suicidal Thoughts:  No  Homicidal Thoughts:  Yes.  without intent/plan to kill others and eat them.   Memory:  Immediate;  Good Recent;   Good Remote;   Good  Judgement:  Poor  Insight:  Good  Psychomotor Activity:  Psychomotor Retardation  Concentration:  Good  Recall:  NA  Fund of Knowledge:Good  Language: Good  Akathisia:  NA  Handed:  Right  AIMS (if indicated):     Assets:  Desire for Improvement Housing  ADL's:  Intact  Cognition: WNL  Sleep:       Treatment Plan Summary: Bipolar affective disorder, mixed, severe, with psychotic behavior unstable and being treated with: Tegretol 200 mg po bid for mood stabilization/Bipolar disorder, Prolixin 5 mg po bid for Psychosis, Amantadine 100 mg twice a day for EDP,Gabapentin 300 mg po tid for agitation and aggression, Ativan 1 mg po every 8 hours as needed for anxiety, Trazodone 50 mg po at bed time for sleep. Will initiate Forced medications tomorrow since patient is refusing his medications.  Disposition: Inpatient psychiatric hospitalization for safety and stabilization.   Delfin Gant  PMHNP-BC 12/15/2014 5:18 PM

## 2014-12-15 NOTE — ED Notes (Signed)
Pt AAO x 3, watching TV at present, remains HI toward others.  Calm & cooperative at present, no distress noted. Monitoring for safety, Q 15 min checks in effect.

## 2014-12-16 DIAGNOSIS — F3164 Bipolar disorder, current episode mixed, severe, with psychotic features: Secondary | ICD-10-CM | POA: Diagnosis not present

## 2014-12-16 MED ORDER — AMANTADINE HCL 100 MG PO CAPS: 100.0000 mg | ORAL_CAPSULE | Freq: Two times a day (BID) | ORAL | Status: DC

## 2014-12-16 MED ORDER — CARBAMAZEPINE 200 MG PO TABS: 200.0000 mg | ORAL_TABLET | Freq: Two times a day (BID) | ORAL | Status: DC

## 2014-12-16 MED ORDER — FLUPHENAZINE HCL 5 MG PO TABS: 5.0000 mg | ORAL_TABLET | Freq: Two times a day (BID) | ORAL | Status: DC

## 2014-12-16 MED ORDER — GABAPENTIN 300 MG PO CAPS: 300.0000 mg | ORAL_CAPSULE | Freq: Three times a day (TID) | ORAL | Status: DC

## 2014-12-16 NOTE — ED Notes (Signed)
Pt refused vitals. RN notified

## 2014-12-16 NOTE — BH Assessment (Signed)
[facility] Assessment Progress Note  Per Robinette at [facility] at 09:49, pt remains on their wait list.  Jalene Mullet, MA Triage Specialist [phone]

## 2014-12-16 NOTE — ED Notes (Signed)
This Probation officer overheard pt talking in dayroom with other pts. Pt stated "I'm here to get disability man."

## 2014-12-16 NOTE — [facility] Suicide Risk Assessment (Signed)
Suicide Risk Assessment  Discharge Assessment   [facility] Discharge Suicide Risk Assessment   Demographic Factors:  Male and Low socioeconomic status  Total Time spent with patient: 30 minutes  Musculoskeletal: Strength & Muscle Tone: within normal limits Gait & Station: normal Patient leans: N/A  Psychiatric Specialty Exam:     Blood pressure 111/51, pulse 67, temperature 97.8 F (36.6 C), temperature source Oral, resp. rate 16, SpO2 100 %.There is no weight on file to calculate BMI.  General Appearance: Casual  Eye Contact::  Good  Speech:  Normal Rate409  Volume:  Normal  Mood:  Irritable at times  Affect:  Congruent  Thought Process:  Coherent  Orientation:  Full (Time, Place, and Person)  Thought Content:  WDL  Suicidal Thoughts:  No  Homicidal Thoughts:  No  Memory:  Immediate;   Good Recent;   Good Remote;   Good  Judgement:  Good  Insight:  Good  Psychomotor Activity:  Normal  Concentration:  Good  Recall:  Good  Fund of Knowledge:Good  Language: Good  Akathisia:  No  Handed:  Right  AIMS (if indicated):     Assets:  Leisure Time Physical Health Resilience  Sleep:     Cognition: WNL  ADL's:  Intact      Has this patient used any form of tobacco in the last 30 days? (Cigarettes, Smokeless Tobacco, Cigars, and/or Pipes) Yes, A prescription for an FDA-approved tobacco cessation medication was offered at discharge and the patient refused  Mental Status Per Nursing Assessment::   On Admission:   Homicidal ideations  Current Mental Status by Physician: NA  Loss Factors: NA  Historical Factors: NA  Risk Reduction Factors:   Sense of responsibility to family and Living with another person, especially a relative  Continued Clinical Symptoms:  Irritability at times  Cognitive Features That Contribute To Risk:  None    Suicide Risk:  Minimal: No identifiable suicidal ideation.  Patients presenting with no risk factors but with morbid ruminations; may  be classified as minimal risk based on the severity of the depressive symptoms  Principal Problem: Bipolar affective disorder, mixed, severe, with psychotic behavior Discharge Diagnoses:  Patient Active Problem List   Diagnosis Date Noted  . Bipolar affective disorder, mixed, severe, with psychotic behavior [F31.64] 10/17/2014    Priority: High  . Substance induced mood disorder [F19.94] 02/08/2014    Priority: High  . Suicidal ideations [R45.851] 12/31/2013    Priority: High  . Homicidal ideations [R45.850] 12/31/2013    Priority: High  . 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 Bipolar 1 disorder [F31.9]   . Cannabis abuse [F12.10] 08/15/2012      Plan Of Care/Follow-up recommendations:  Activity:  as tolerated  Diet:  heart healthy diet  Is patient on multiple antipsychotic therapies at discharge:  No   Has Patient had three or more failed trials of antipsychotic monotherapy by history:  No  Recommended Plan for Multiple Antipsychotic Therapies: NA    Micha Dosanjh, PMH-NP 12/16/2014, 1:06 PM

## 2014-12-16 NOTE — Consult Note (Signed)
[facility] Face-to-Face Psychiatry Consult   Reason for Consult:  Homicidal ideation and auditory hallucinations Referring Physician: EDP Patient Identification: Justin Chaney MRN:  [id number] Principal Diagnosis: Bipolar affective disorder, mixed, severe, with psychotic behavior Diagnosis:   Patient Active Problem List   Diagnosis Date Noted  . Bipolar affective disorder, mixed, severe, with psychotic behavior [F31.64] 10/17/2014    Priority: High  . Substance induced mood disorder [F19.94] 02/08/2014    Priority: High  . Suicidal ideations [R45.851] 12/31/2013    Priority: High  . Homicidal ideations [R45.850] 12/31/2013    Priority: High  . 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 Bipolar 1 disorder [F31.9]   . Cannabis abuse [F12.10] 08/15/2012    Total Time spent with patient: 30 minutes  Subjective:   Justin Chaney is a 19 y.o. male patient does not warrant admission.  HPI:  The patient has started medications in the ED and has stabilized.  He denies suicidal/homicidal ideations and alcohol/drug abuse.  Justin Chaney has been living with 3 or 4 friends in a hotel and is trying to get disability.  He reports hearing voices and seeing things his whole life, none on assessment.  Stable for discharge, Rx provided.  HPI Elements:   Location:  generalized. Quality:  acute. Severity:  moderate. Timing:  intermittent. Duration:  few days. Context:   no medications.  Past Medical History:  Past Medical History  Diagnosis Date  . Asthma   . Seizures   . Brain ventricular shunt obstruction     hydrochelpis  . Depression   . Bipolar disorder     Past Surgical History  Procedure Laterality Date  . Tonsillectomy    . Ventriculo-peritoneal shunt placement / laparoscopic insertion peritoneal catheter     Family History: History reviewed. No  pertinent family history. Social History:  History  Alcohol Use  . 7.2 oz/week  . 12 Cans of beer per week    Comment: 4-8 40 oz.on the weekend     History  Drug Use  . Yes  . Special: Marijuana, Other-see comments, Amphetamines    Comment: Adderall, "I don't do weed, only rarely"    History   Social History  . Marital Status: Single    Spouse Name: N/A  . Number of Children: N/A  . Years of Education: N/A   Social History Main Topics  . Smoking status: Current Every Day Smoker -- 1.00 packs/day    Types: Cigarettes  . Smokeless tobacco: Not on file  . Alcohol Use: 7.2 oz/week    12 Cans of beer per week     Comment: 4-8 40 oz.on the weekend  . Drug Use: Yes    Special: Marijuana, Other-see comments, Amphetamines     Comment: Adderall, "I don't do weed, only rarely"  . Sexual Activity: Not on file   Other Topics Concern  . None   Social History Narrative   Additional Social History:                          Allergies:   Allergies  Allergen Reactions  . Amoxicillin Hives  . Penicillins Hives and Rash    Labs: No results found for this or any previous visit (from the past 48 hour(s)).  Vitals: Blood pressure 111/51, pulse 86, temperature 98.1 F (36.7 C), temperature source Oral, resp. rate  19, SpO2 100 %.  Risk to Self: Is patient at risk for suicide?: No, but patient needs Medical Clearance Risk to Others:   Prior Inpatient Therapy:   Prior Outpatient Therapy:    Current Facility-Administered Medications  Medication Dose Route Frequency Provider Last Rate Last Dose  . acetaminophen (TYLENOL) tablet 650 mg  650 mg Oral Q4H PRN April Palumbo, MD      . amantadine (SYMMETREL) capsule 100 mg  100 mg Oral BID Delfin Gant, NP   100 mg at 12/16/14 1114  . carbamazepine (TEGRETOL) tablet 200 mg  200 mg Oral BID Delfin Gant, NP   200 mg at 12/16/14 1114  . fluPHENAZine (PROLIXIN) tablet 5 mg  5 mg Oral BID Delfin Gant, NP   5 mg at  12/16/14 1114  . gabapentin (NEURONTIN) capsule 300 mg  300 mg Oral TID Delfin Gant, NP   300 mg at 12/16/14 1114  . ibuprofen (ADVIL,MOTRIN) tablet 600 mg  600 mg Oral Q8H PRN April Palumbo, MD      . LORazepam (ATIVAN) tablet 1 mg  1 mg Oral Q8H PRN April Palumbo, MD       Current Outpatient Prescriptions  Medication Sig Dispense Refill  . amantadine (SYMMETREL) 100 MG capsule Take 1 capsule (100 mg total) by mouth 2 (two) times daily. 60 capsule 0  . carbamazepine (TEGRETOL) 200 MG tablet Take 1 tablet (200 mg total) by mouth 2 (two) times daily. (Patient not taking: Reported on 12/08/2014) 60 tablet 0  . carbamazepine (TEGRETOL) 200 MG tablet Take 1 tablet (200 mg total) by mouth 2 (two) times daily. 60 tablet 0  . fluPHENAZine (PROLIXIN) 5 MG tablet Take 1 tablet (5 mg total) by mouth 2 (two) times daily. 60 tablet 0  . gabapentin (NEURONTIN) 300 MG capsule Take 1 capsule (300 mg total) by mouth 3 (three) times daily. (Patient not taking: Reported on 12/08/2014) 90 capsule 0  . gabapentin (NEURONTIN) 300 MG capsule Take 1 capsule (300 mg total) by mouth 3 (three) times daily. 90 capsule 0  . QUEtiapine (SEROQUEL) 400 MG tablet Take 1 tablet (400 mg total) by mouth at bedtime. (Patient not taking: Reported on 12/08/2014) 30 tablet 0    Musculoskeletal: Strength & Muscle Tone: within normal limits Gait & Station: normal Patient leans: N/A  Psychiatric Specialty Exam: Physical Exam  Review of Systems  Constitutional: Negative.   HENT: Negative.   Eyes: Negative.   Respiratory: Negative.   Cardiovascular: Negative.   Gastrointestinal: Negative.   Genitourinary: Negative.   Musculoskeletal: Negative.   Skin: Negative.   Neurological: Negative.   Endo/Heme/Allergies: Negative.   Psychiatric/Behavioral: Negative.     Blood pressure 111/51, pulse 86, temperature 98.1 F (36.7 C), temperature source Oral, resp. rate 19, SpO2 100 %.There is no weight on file to calculate BMI.   General Appearance: Casual  Eye Contact::  Good  Speech:  Normal Rate  Volume:  Normal  Mood:  Irritable at times  Affect:  Congruent  Thought Process:  Coherent  Orientation:  Full (Time, Place, and Person)  Thought Content:  WDL  Suicidal Thoughts:  No  Homicidal Thoughts:  No  Memory:  Immediate;   Good Recent;   Good Remote;   Good  Judgement:  Good  Insight:  Good  Psychomotor Activity:  Normal  Concentration:  Good  Recall:  Good  Fund of Knowledge:Good  Language: Good  Akathisia:  No  Handed:  Right  AIMS (if indicated):  Assets:  Leisure Time Physical Health Resilience  ADL's:  Intact  Cognition: WNL  Sleep:      Medical Decision Making: Review of Psycho-Social Stressors (1), Review or order clinical lab tests (1) and Review of Medication Regimen & Side Effects (2)  Treatment Plan Summary: Daily contact with patient to assess and evaluate symptoms and progress in treatment, Medication management and Plan discharge home with follow-up resources and RX  Plan:  No evidence of imminent risk to self or others at present.    Disposition: discharge home with follow-up resources and RX  Waylan Boga, PMH-NP 12/16/2014 1:10 PM   Justin Chaney presented with auditory hallucinations and homicide ideation without intent and plan. He has  been treated with medications in the ED and has stabilized.  He denies suicidal/homicidal ideations and alcohol/drug abuse. He denies current auditory or visual hallucinations, suicide or homicide ideations, intention or plans. He is stable for discharge and willing to follow up with out patient care. Reviewed the information documented and agree with the treatment/disposition plan.  Justin Chaney,Justin R. 12/16/2014 1:55 PM

## 2014-12-16 NOTE — Progress Notes (Signed)
LCSW received call from [facility]  Re: Justin Chaney. LCSW confirmed patient still needs placement and is on wait list. No need clinicals or medical needs at this time.  Justin Chaney reports she will call once bed is available.  Unknown time frame at this time.  Lane Hacker, MSW Clinical Social Work: Emergency Room [phone]

## 2015-01-16 ENCOUNTER — Encounter (HOSPITAL_COMMUNITY): Payer: Self-pay | Admitting: Emergency Medicine

## 2015-01-16 DIAGNOSIS — Z720 Tobacco use: Secondary | ICD-10-CM | POA: Diagnosis not present

## 2015-01-16 DIAGNOSIS — F319 Bipolar disorder, unspecified: Secondary | ICD-10-CM | POA: Diagnosis not present

## 2015-01-16 DIAGNOSIS — G40909 Epilepsy, unspecified, not intractable, without status epilepticus: Secondary | ICD-10-CM | POA: Insufficient documentation

## 2015-01-16 DIAGNOSIS — F1510 Other stimulant abuse, uncomplicated: Secondary | ICD-10-CM | POA: Diagnosis not present

## 2015-01-16 DIAGNOSIS — Z880 Allergy status to penicillin: Secondary | ICD-10-CM | POA: Insufficient documentation

## 2015-01-16 DIAGNOSIS — F315 Bipolar disorder, current episode depressed, severe, with psychotic features: Secondary | ICD-10-CM | POA: Diagnosis not present

## 2015-01-16 DIAGNOSIS — J45909 Unspecified asthma, uncomplicated: Secondary | ICD-10-CM | POA: Insufficient documentation

## 2015-01-16 DIAGNOSIS — Z9114 Patient's other noncompliance with medication regimen: Secondary | ICD-10-CM | POA: Diagnosis not present

## 2015-01-16 DIAGNOSIS — R45851 Suicidal ideations: Secondary | ICD-10-CM | POA: Diagnosis not present

## 2015-01-16 NOTE — ED Notes (Signed)
Staffing office notified for pt.'s sitter.

## 2015-01-16 NOTE — ED Notes (Signed)
The patient says he "does not give a shit about life and is ready to give it up".  He says he does not have a plan but it can be slow or fast and painful".  He said he life "has not been about shit since he was born and it is getting worse.

## 2015-01-17 ENCOUNTER — Emergency Department (HOSPITAL_COMMUNITY)
Admission: EM | Admit: 2015-01-17 | Discharge: 2015-01-18 | Disposition: A | Discharge status: Other Acute Inpatient Hospital | Payer: BLUE CROSS/BLUE SHIELD | Attending: Emergency Medicine | Admitting: Emergency Medicine

## 2015-01-17 DIAGNOSIS — R45851 Suicidal ideations: Secondary | ICD-10-CM | POA: Diagnosis not present

## 2015-01-17 DIAGNOSIS — Z9114 Patient's other noncompliance with medication regimen: Secondary | ICD-10-CM

## 2015-01-17 DIAGNOSIS — F315 Bipolar disorder, current episode depressed, severe, with psychotic features: Secondary | ICD-10-CM

## 2015-01-17 DIAGNOSIS — Z91148 Patient's other noncompliance with medication regimen for other reason: Secondary | ICD-10-CM

## 2015-01-17 DIAGNOSIS — F3164 Bipolar disorder, current episode mixed, severe, with psychotic features: Secondary | ICD-10-CM | POA: Diagnosis present

## 2015-01-17 LAB — COMPREHENSIVE METABOLIC PANEL
ALT: 25 U/L (ref 17–63)
AST: 25 U/L (ref 15–41)
Albumin: 4 g/dL (ref 3.5–5.0)
Alkaline Phosphatase: 80 U/L (ref 38–126)
Anion gap: 10 (ref 5–15)
BUN: 10 mg/dL (ref 6–20)
CO2: 23 mmol/L (ref 22–32)
Calcium: 9.1 mg/dL (ref 8.9–10.3)
Chloride: 102 mmol/L (ref 101–111)
Creatinine, Ser: 0.87 mg/dL (ref 0.61–1.24)
GFR calc Af Amer: >60 mL/min (ref >60)
GFR calc non Af Amer: >60 mL/min (ref >60)
Glucose, Bld: 119 mg/dL — ABNORMAL HIGH (ref 65–99)
Potassium: 3.3 mmol/L — ABNORMAL LOW (ref 3.5–5.1)
Sodium: 135 mmol/L (ref 135–145)
Total Bilirubin: 1.4 mg/dL — ABNORMAL HIGH (ref 0.3–1.2)
Total Protein: 7 g/dL (ref 6.5–8.1)

## 2015-01-17 LAB — RAPID URINE DRUG SCREEN, HOSP PERFORMED
Amphetamines: POSITIVE — AB
Barbiturates: NOT DETECTED
Benzodiazepines: NOT DETECTED
Cocaine: NOT DETECTED
Opiates: NOT DETECTED
Tetrahydrocannabinol: NOT DETECTED

## 2015-01-17 LAB — SALICYLATE LEVEL: Salicylate Lvl: <4 mg/dL (ref 2.8–30.0)

## 2015-01-17 LAB — ACETAMINOPHEN LEVEL: Acetaminophen (Tylenol), Serum: <10 ug/mL — ABNORMAL LOW (ref 10–30)

## 2015-01-17 LAB — ETHANOL: Alcohol, Ethyl (B): <5 mg/dL (ref <5)

## 2015-01-17 MED ORDER — FLUPHENAZINE HCL 5 MG PO TABS
5.0000 mg | ORAL_TABLET | Freq: Every day | ORAL | Status: DC
  Administered 2015-01-17 – 2015-01-18 (×2): 5 mg via ORAL
  Filled 2015-01-17 (×3): qty 1

## 2015-01-17 MED ORDER — LORAZEPAM 2 MG/ML IJ SOLN: 2.0000 mg | Freq: Once | INTRAMUSCULAR | Status: DC

## 2015-01-17 MED ORDER — CARBAMAZEPINE 200 MG PO TABS
200.0000 mg | ORAL_TABLET | Freq: Two times a day (BID) | ORAL | Status: DC
  Administered 2015-01-18: 200 mg via ORAL
  Filled 2015-01-17 (×2): qty 1

## 2015-01-17 NOTE — ED Notes (Signed)
Pt accepted at [facility] by Dr Eugenio Hoes on the young adult unit tomorrow 04/19/18 after 9 am.

## 2015-01-17 NOTE — BH Assessment (Signed)
Tele Assessment Note   Justin Chaney is a 19 y.o. male who voluntarily presents to [facility] with SI and depression.  This Probation officer attempted to interview pt, however he refused interview and told this Probation officer he didn't want to talk.  Pt made no eye contact, looking at the floor and moving his chair to avoid eye contact. The following info is collateral, gathered from nursing/physician notes.  Upon arrival to [facility], pt told medical staff--" does not give a shit about life and is ready to give it up".  Pt does not have a plan to harm self but "it can be fast, slow and painful".  He stated life "hasnot been about shit since he was born and it's getting worse".  He is c/o of SI foer the past few days.  He denies HI, pt does not appear to be responding to internal stimuli.   Axis I: Bipolar I disorder, Current or most recent episode depressed Axis II: Deferred Axis III:  Past Medical History  Diagnosis Date  . Asthma   . Seizures   . Brain ventricular shunt obstruction     hydrochelpis  . Depression   . Bipolar disorder    Axis IV: other psychosocial or environmental problems, problems related to social environment and problems with primary support group Axis V: 31-40 impairment in reality testing  Past Medical History:  Past Medical History  Diagnosis Date  . Asthma   . Seizures   . Brain ventricular shunt obstruction     hydrochelpis  . Depression   . Bipolar disorder     Past Surgical History  Procedure Laterality Date  . Tonsillectomy    . Ventriculo-peritoneal shunt placement / laparoscopic insertion peritoneal catheter      Family History: History reviewed. No pertinent family history.  Social History:  reports that he has been smoking Cigarettes.  He has been smoking about 1.00 pack per day. He does not have any smokeless tobacco history on file. He reports that he drinks about 7.2 oz of alcohol per week. He reports that he uses illicit drugs (Marijuana, Other-see comments,  and Amphetamines).  Additional Social History:  Alcohol / Drug Use Pain Medications: See MAR  Prescriptions: See MAR  Over the Counter: See MAR  History of alcohol / drug use?: Yes Longest period of sobriety (when/how long): Per HX, pt did not dislcose specifics on SA use   CIWA: CIWA-Ar BP: 124/86 mmHg Pulse Rate: 102 COWS:    PATIENT STRENGTHS: (choose at least two) NA  Allergies:  Allergies  Allergen Reactions  . Amoxicillin Hives  . Penicillins Hives and Rash    Home Medications:  (Not in a hospital admission)  OB/GYN Status:  No LMP for male patient.  General Assessment Data Location of Assessment: [facility] ED TTS Assessment: In system Is this a Tele or Face-to-Face Assessment?: Tele Assessment Is this an Initial Assessment or a Re-assessment for this encounter?: Initial Assessment Marital status: Single Maiden name: None  Is patient pregnant?: No Pregnancy Status: No Living Arrangements: Alone, Non-relatives/Friends Can pt return to current living arrangement?: Yes Admission Status: Voluntary Is patient capable of signing voluntary admission?: Yes Referral Source: MD Insurance type: [place] Screening Exam ([place]) Medical Exam completed: No Reason for MSE not completed: Other: (None )  Crisis Care Plan Living Arrangements: Alone, Non-relatives/Friends Name of Psychiatrist: None  Name of Therapist: None   Education Status Is patient currently in school?: No Current Grade: None  Highest grade of  school patient has completed: None  Name of school: None  Contact person: None   Risk to self with the past 6 months Suicidal Ideation: Yes-Currently Present Has patient been a risk to self within the past 6 months prior to admission? : No Suicidal Intent: No Has patient had any suicidal intent within the past 6 months prior to admission? : No Is patient at risk for suicide?: No Suicidal Plan?: No Has patient had any suicidal plan within the  past 6 months prior to admission? : No Access to Means: No What has been your use of drugs/alcohol within the last 12 months?: Yes per hx  Previous Attempts/Gestures: No How many times?: 0 Other Self Harm Risks: None  Triggers for Past Attempts: Unpredictable Intentional Self Injurious Behavior: None Family Suicide History: No Recent stressful life event(s): Other (Comment) (Unk ) Persecutory voices/beliefs?: No Depression: Yes Depression Symptoms: Loss of interest in usual pleasures, Feeling worthless/self pity Substance abuse history and/or treatment for substance abuse?: Yes Suicide prevention information given to non-admitted patients: Not applicable  Risk to Others within the past 6 months Homicidal Ideation: No Does patient have any lifetime risk of violence toward others beyond the six months prior to admission? : No Thoughts of Harm to Others: No Current Homicidal Intent: No Current Homicidal Plan: No Access to Homicidal Means: No Identified Victim: None History of harm to others?: No Assessment of Violence: None Noted Violent Behavior Description: None  Does patient have access to weapons?: No Criminal Charges Pending?: No Does patient have a court date: No Is patient on probation?: No  Psychosis Hallucinations: None noted Delusions: None noted  Mental Status Report Appearance/Hygiene: In scrubs Eye Contact: Poor Motor Activity: Unremarkable Speech: Logical/coherent Level of Consciousness: Alert Mood: Depressed Affect: Depressed Anxiety Level: None Thought Processes: Coherent, Relevant Judgement: Impaired Orientation: Person, Place, Time, Situation Obsessive Compulsive Thoughts/Behaviors: None  Cognitive Functioning Concentration: Normal Memory: Recent Intact, Remote Intact IQ: Average Insight: Poor Impulse Control: Poor Appetite: Fair Weight Loss: 0 Weight Gain: 0 Sleep: No Change Total Hours of Sleep: 6 Vegetative Symptoms: None  ADLScreening  [facility] Assessment Services) Patient's cognitive ability adequate to safely complete daily activities?: Yes Patient able to express need for assistance with ADLs?: Yes Independently performs ADLs?: Yes (appropriate for developmental age)  Prior Inpatient Therapy Prior Inpatient Therapy: Yes Prior Therapy Dates: 2015 Prior Therapy Facilty/Provider([facility]): [facility]  Reason for Treatment: SI/depression   Prior Outpatient Therapy Prior Outpatient Therapy: No Prior Therapy Dates: None  Prior Therapy Facilty/Provider(s): None  Reason for Treatment: None  Does patient have an ACCT team?: No Does patient have Intensive In-House Services?  : No Does patient have [facility] services? : No Does patient have P4CC services?: No  ADL Screening (condition at time of admission) Patient's cognitive ability adequate to safely complete daily activities?: Yes Is the patient deaf or have difficulty hearing?: No Does the patient have difficulty seeing, even when wearing glasses/contacts?: No Does the patient have difficulty concentrating, remembering, or making decisions?: No Patient able to express need for assistance with ADLs?: Yes Does the patient have difficulty dressing or bathing?: No Independently performs ADLs?: Yes (appropriate for developmental age) Does the patient have difficulty walking or climbing stairs?: No Weakness of Legs: None Weakness of Arms/Hands: None  Home Assistive Devices/Equipment Home Assistive Devices/Equipment: None  Therapy Consults (therapy consults require a physician order) PT Evaluation Needed: No OT Evalulation Needed: No SLP Evaluation Needed: No Abuse/Neglect Assessment (Assessment to be complete while patient is alone) Physical Abuse:  Denies Verbal Abuse: Denies Sexual Abuse: Denies Exploitation of patient/patient's resources: Denies Self-Neglect: Denies Values / Beliefs Cultural Requests During Hospitalization: None Spiritual Requests During Hospitalization:  None Consults Spiritual Care Consult Needed: No Social Work Consult Needed: No Regulatory affairs officer (For Healthcare) Does patient have an advance directive?: No Would patient like information on creating an advanced directive?: No - patient declined information    Additional Information 1:1 In Past 12 Months?: No CIRT Risk: No Elopement Risk: No Does patient have medical clearance?: Yes     Disposition:  Disposition Disposition of Patient: Referred to (Per Arlester Marker, AM psych eval for final dispostiion ) Patient referred to: Other (Comment) (AM psych eval for final disposition)  Girtha Rm 01/17/2015 2:20 AM

## 2015-01-17 NOTE — ED Notes (Signed)
Pt refused nighttime medication, pt became very angry and stated "I  don't what that" when question why he became more offensive and started waving his hands in the air yelling "I just don't want it"

## 2015-01-17 NOTE — ED Notes (Signed)
TTS at bedside. 

## 2015-01-17 NOTE — ED Notes (Addendum)
Dr Laymond Purser at bedside.

## 2015-01-17 NOTE — ED Notes (Signed)
Meal tray ordered 

## 2015-01-17 NOTE — ED Notes (Addendum)
Pt noted to be sitting in a chair behind the door in the dark.  Informed pt he needed to sit on the bed or let this RN move the chair.  Pt stated he wanted the light off.  Pt would not get up the from chair.  Pt sitting with aggressive expression.  As soon as this RN left the room, pt turned the lights off.  This RN noted pt has left room to pace a short distance from the room 2-3 times in the last half hour.

## 2015-01-17 NOTE — ED Notes (Signed)
Sitter reports pt sitting in the corner of the room in the dark with aggressive facial expressions.  Notified Dr Jeneen Rinks of increasing level of agitation.

## 2015-01-17 NOTE — ED Notes (Signed)
Pt refused labs.  

## 2015-01-17 NOTE — BH Assessment (Signed)
[facility] Assessment Progress Note 6/17 Accepted by Dr. Eugenio Hoes to [facility] young adult unit after 9 am tomorrow-needs to be IVC'd.

## 2015-01-17 NOTE — ED Notes (Signed)
Patient is resting comfortably. 

## 2015-01-17 NOTE — ED Notes (Addendum)
Pt refused vitals 

## 2015-01-17 NOTE — ED Notes (Signed)
Pt pacing back and forth in room

## 2015-01-17 NOTE — Consult Note (Addendum)
[facility] Face-to-Face Psychiatry Consult   Reason for Consult:  Depression, suicidal ideation, auditory hallucinations and non compliant with medications Referring Physician: EDP Patient Identification: Justin Chaney MRN:  [id number] Principal Diagnosis: Non compliance w medication regimen, bipolar depression with suicide ideations Diagnosis:   Patient Active Problem List   Diagnosis Date Noted  . Bipolar 1 disorder [F31.9]   . Bipolar affective disorder, mixed, severe, with psychotic behavior [F31.64] 10/17/2014  . Substance induced mood disorder [F19.94] 02/08/2014  . Suicidal ideations [R45.851] 12/31/2013  . Homicidal ideations [R45.850] 12/31/2013  . 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 Cannabis abuse [F12.10] 08/15/2012  . 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 male patient does not warrant admission.  HPI:  Justin Chaney is a 19 y.o. male seen face-to-face psychiatric consultation and evaluation at [facility] Emergency Department. Patient is known to this provider from his previous encounter in [facility] psychiatric floor. Patient admitted to the emergency department with the suicidal ideation without intention and plan. Patient reported he has been noncompliant with his medication management and continue to ruminate about suicidal ideation. Patient also has a history of alcohol and drug abuse. Patient stated he cannot contract for safety patient was not able to respond to questions like why he was non-compliant with his medication management. Patient has no current homicidal ideation, intention or plan. He denies use of illicit drugs and ETOH tonight and at the same time he was not cooperative to provide blood and urine samples. Justin Chaney has been living with 3 or 4 friends in a hotel and is trying to get  disability.  He reports hearing voices and seeing things his whole life, none on assessment.  Patient agrees to be compliant with staff regarding providing samples and also taking his medication is prescribed in the emergency department.  HPI Elements:   Location:  generalized. Quality:  acute. Severity:  moderate. Timing:  intermittent. Duration:  few days. Context:   no medications.  Past Medical History:  Past Medical History  Diagnosis Date  . Asthma   . Seizures   . Brain ventricular shunt obstruction     hydrochelpis  . Depression   . Bipolar disorder     Past Surgical History  Procedure Laterality Date  . Tonsillectomy    . Ventriculo-peritoneal shunt placement / laparoscopic insertion peritoneal catheter     Family History: History reviewed. No pertinent family history. Social History:  History  Alcohol Use  . 7.2 oz/week  . 12 Cans of beer per week    Comment: 4-8 40 oz.on the weekend     History  Drug Use  . Yes  . Special: Marijuana, Other-see comments, Amphetamines    Comment: Adderall, "I don't do weed, only rarely"    History   Social History  . Marital Status: Single    Spouse Name: N/A  . Number of Children: N/A  . Years of Education: N/A   Social History Main Topics  . Smoking status: Current Every Day Smoker -- 1.00 packs/day    Types: Cigarettes  . Smokeless tobacco: Not on file  . Alcohol Use: 7.2 oz/week    12 Cans of beer per week     Comment: 4-8 40 oz.on the weekend  . Drug Use: Yes    Special: Marijuana, Other-see comments, Amphetamines     Comment: Adderall, "I don't  do weed, only rarely"  . Sexual Activity: Not on file   Other Topics Concern  . None   Social History Narrative   Additional Social History:    Pain Medications: See MAR  Prescriptions: See MAR  Over the Counter: See MAR  History of alcohol / drug use?: Yes Longest period of sobriety (when/how long): Per HX, pt did not dislcose specifics on SA use                       Allergies:   Allergies  Allergen Reactions  . Amoxicillin Hives  . Penicillins Hives and Rash    Labs: No results found for this or any previous visit (from the past 48 hour(s)).  Vitals: Blood pressure 128/71, pulse 97, temperature 98.3 F (36.8 C), temperature source Oral, resp. rate 18, SpO2 99 %.  Risk to Self: Suicidal Ideation: Yes-Currently Present Suicidal Intent: No Is patient at risk for suicide?: No Suicidal Plan?: No Access to Means: No What has been your use of drugs/alcohol within the last 12 months?: Yes per hx  How many times?: 0 Other Self Harm Risks: None  Triggers for Past Attempts: Unpredictable Intentional Self Injurious Behavior: None Risk to Others: Homicidal Ideation: No Thoughts of Harm to Others: No Current Homicidal Intent: No Current Homicidal Plan: No Access to Homicidal Means: No Identified Victim: None History of harm to others?: No Assessment of Violence: None Noted Violent Behavior Description: None  Does patient have access to weapons?: No Criminal Charges Pending?: No Does patient have a court date: No Prior Inpatient Therapy: Prior Inpatient Therapy: Yes Prior Therapy Dates: 2015 Prior Therapy Facilty/Provider(s): [facility]  Reason for Treatment: SI/depression  Prior Outpatient Therapy: Prior Outpatient Therapy: No Prior Therapy Dates: None  Prior Therapy Facilty/Provider(s): None  Reason for Treatment: None  Does patient have an ACCT team?: No Does patient have Intensive In-House Services?  : No Does patient have [facility] services? : No Does patient have P4CC services?: No  Current Facility-Administered Medications  Medication Dose Route Frequency Provider Last Rate Last Dose  . carbamazepine (TEGRETOL) tablet 200 mg  200 mg Oral BID AC & HS Ambrose Finland, MD      . fluPHENAZine (PROLIXIN) tablet 5 mg  5 mg Oral Daily Ambrose Finland, MD       Current Outpatient Prescriptions  Medication Sig  Dispense Refill  . amantadine (SYMMETREL) 100 MG capsule Take 1 capsule (100 mg total) by mouth 2 (two) times daily. (Patient not taking: Reported on 01/17/2015) 60 capsule 0  . carbamazepine (TEGRETOL) 200 MG tablet Take 1 tablet (200 mg total) by mouth 2 (two) times daily. (Patient not taking: Reported on 01/17/2015) 60 tablet 0  . fluPHENAZine (PROLIXIN) 5 MG tablet Take 1 tablet (5 mg total) by mouth 2 (two) times daily. (Patient not taking: Reported on 01/17/2015) 60 tablet 0  . gabapentin (NEURONTIN) 300 MG capsule Take 1 capsule (300 mg total) by mouth 3 (three) times daily. (Patient not taking: Reported on 01/17/2015) 90 capsule 0    Musculoskeletal: Strength & Muscle Tone: within normal limits Gait & Station: normal Patient leans: N/A  Psychiatric Specialty Exam: Physical Exam: Full physical performed in Emergency Department. I have reviewed this assessment and concur with its findings.   Review of Systems  Constitutional: Negative.   HENT: Negative.   Eyes: Negative.   Respiratory: Negative.   Cardiovascular: Negative.   Gastrointestinal: Negative.   Genitourinary: Negative.   Musculoskeletal: Negative.   Skin:  Negative.   Neurological: Negative.   Endo/Heme/Allergies: Negative.   Psychiatric/Behavioral: Negative.     Blood pressure 128/71, pulse 97, temperature 98.3 F (36.8 C), temperature source Oral, resp. rate 18, SpO2 99 %.There is no weight on file to calculate BMI.  General Appearance: Bizarre, Disheveled and Guarded  Eye Contact::  Fair  Speech:  Blocked, Slow and Slurred  Volume:  Decreased  Mood:  Irritable   Affect:  Blunt, Non-Congruent, Depressed and Restricted  Thought Process:  Coherent  Orientation:  Full (Time, Place, and Person)  Thought Content:  WDL  Suicidal Thoughts:  Yes.  without intent/plan  Homicidal Thoughts:  No  Memory:  Immediate;   Good Recent;   Good Remote;   Good  Judgement:  Poor  Insight:  Fair  Psychomotor Activity:  Decreased   Concentration:  Good  Recall:  Good  Fund of Knowledge:Good  Language: Good  Akathisia:  No  Handed:  Right  AIMS (if indicated):     Assets:  Communication Skills Desire for Improvement Leisure Time Physical Health Resilience Social Support  ADL's:  Intact  Cognition: WNL  Sleep:      Medical Decision Making: Review of Psycho-Social Stressors (1), Review or order clinical lab tests (1) and Review of Medication Regimen & Side Effects (2)  Treatment Plan Summary: Daily contact with patient to assess and evaluate symptoms and progress in treatment, Medication management and Plan discharge home with follow-up resources and RX  Plan:  Will check labs as ordered including UDS and blood alcohol Will restart his home mediation Tegretol 200 mg PO BID for bipolar depression  and Prolixin 5 mg PO QD for psychosis Recommend psychiatric Inpatient admission when medically cleared. Supportive therapy provided about ongoing stressors.  Patient will be referred to the psychiatric social service regarding appropriate inpatient hospitalization for crisis stabilization, safety monitoring on medication management of bipolar depression, suicidal ideation with psychosis. Appreciate psychiatric consultation Please contact [phone] or [phone] if needs further assistance   Disposition: Restart home medication and admit to the acute psychiatric hospitalization for further assessment, stabilization and treatment of bipolar depression with psychosis and suicidal ideation.   Girlie Veltri,JANARDHAHA R. 01/17/2015 11:32 AM

## 2015-01-17 NOTE — ED Notes (Addendum)
Pt was hesitant to take prolixin but agreed after discussion.  Pt states he doesn't take anything at home "because it don't work.  I keep telling them that.  Medication doesn't work."  Explained to pt is was helpful to take medications but he can discuss changing them to see if something else would work better with the doctors.  Pt shook his head no, stating "it wouldn't work."  Pt does not remember speaking with Dr Louretta Shorten today or agreeing to comply with medications.  Pt laying in bed calm but unable to sleep.

## 2015-01-17 NOTE — ED Notes (Signed)
Pt refused vitals 

## 2015-01-17 NOTE — ED Notes (Signed)
Per Dr Louretta Shorten pt is willing to take medications and allow blood and urine collection.

## 2015-01-17 NOTE — ED Notes (Signed)
Pt laying in bed resting, pt is withdrawn, upon assessment pt states "I just don't want to talk to anyone right now" pt would not answer questions about SI/HI. Pt continued to lay in bed and look towards the wall, pt at most of his dinner, went to check on pt and pt became angry and stated "im not sleeping in just resting"

## 2015-01-17 NOTE — ED Provider Notes (Signed)
CSN: [id number]     Arrival date & time 01/16/15  2240 History  This chart was scribed for Everlene Balls, MD by Evelene Croon, ED Scribe. This patient was seen in room A12C/A12C and the patient's care was started 12:52 AM.    Chief Complaint  Patient presents with  . Suicidal    The patient says he "does not give a shit about life and is ready to give it up".  He says he does not have a plan but it can be slow or fast and painful".    The history is provided by the patient. No language interpreter was used.     HPI Comments:  Justin Chaney is a 19 y.o. male who presents to the Emergency Department complaining of SI. He states he has felt this way for a few days but the urge is worse today. He notes he did not have a specific plan of action. He also reports HI . He denies use of illicit drugs and ETOH tonight. He also denies fever, cough, vomiting, diarrhea, and rhinorrhea. Pt has no physical complaints or symptoms at this time. No alleviating factors noted. When asked if he wants to hurt other people, patient responded, "I dont want to answer that."   Past Medical History  Diagnosis Date  . Asthma   . Seizures   . Brain ventricular shunt obstruction     hydrochelpis  . Depression   . Bipolar disorder    Past Surgical History  Procedure Laterality Date  . Tonsillectomy    . Ventriculo-peritoneal shunt placement / laparoscopic insertion peritoneal catheter     History reviewed. No pertinent family history. History  Substance Use Topics  . Smoking status: Current Every Day Smoker -- 1.00 packs/day    Types: Cigarettes  . Smokeless tobacco: Not on file  . Alcohol Use: 7.2 oz/week    12 Cans of beer per week     Comment: 4-8 40 oz.on the weekend    Review of Systems  A complete 10 system review of systems was obtained and all systems are negative except as noted in the HPI and PMH.    Allergies  Amoxicillin and Penicillins  Home Medications   Prior to Admission  medications   Medication Sig Start Date End Date Taking? Authorizing Provider  amantadine (SYMMETREL) 100 MG capsule Take 1 capsule (100 mg total) by mouth 2 (two) times daily. Patient not taking: Reported on 01/17/2015 12/16/14   Patrecia Pour, NP  carbamazepine (TEGRETOL) 200 MG tablet Take 1 tablet (200 mg total) by mouth 2 (two) times daily. Patient not taking: Reported on 01/17/2015 12/16/14   Patrecia Pour, NP  fluPHENAZine (PROLIXIN) 5 MG tablet Take 1 tablet (5 mg total) by mouth 2 (two) times daily. Patient not taking: Reported on 01/17/2015 12/16/14   Patrecia Pour, NP  gabapentin (NEURONTIN) 300 MG capsule Take 1 capsule (300 mg total) by mouth 3 (three) times daily. Patient not taking: Reported on 01/17/2015 12/16/14   Patrecia Pour, NP   BP 124/86 mmHg  Pulse 102  Temp(Src) 97.8 F (36.6 C) (Oral)  Resp 26  SpO2 100% Physical Exam  Constitutional: He is oriented to person, place, and time. Vital signs are normal. He appears well-developed and well-nourished.  Non-toxic appearance. He does not appear ill. No distress.  Pt uncooperative with physical examination   HENT:  Head: Normocephalic and atraumatic.  Nose: Nose normal.  Mouth/Throat: Oropharynx is clear and moist. No oropharyngeal exudate.  Eyes: Conjunctivae and EOM are normal. Pupils are equal, round, and reactive to light. No scleral icterus.  Neck: Normal range of motion. Neck supple. No tracheal deviation, no edema, no erythema and normal range of motion present. No thyroid mass and no thyromegaly present.  Cardiovascular: Normal rate, regular rhythm, S1 normal, S2 normal, normal heart sounds, intact distal pulses and normal pulses.  Exam reveals no gallop and no friction rub.   No murmur heard. Pulses:      Radial pulses are 2+ on the right side, and 2+ on the left side.       Dorsalis pedis pulses are 2+ on the right side, and 2+ on the left side.  Pulmonary/Chest: Effort normal and breath sounds normal. No  respiratory distress. He has no wheezes. He has no rhonchi. He has no rales.  Abdominal: Soft. Normal appearance and bowel sounds are normal. He exhibits no distension, no ascites and no mass. There is no hepatosplenomegaly. There is no tenderness. There is no rebound, no guarding and no CVA tenderness.  Lymphadenopathy:    He has no cervical adenopathy.  Neurological: He is alert and oriented to person, place, and time. He has normal strength. No cranial nerve deficit or sensory deficit.  Skin: Skin is warm, dry and intact. No petechiae and no rash noted. He is not diaphoretic. No erythema. No pallor.  Psychiatric:  SI HI  Nursing note and vitals reviewed.   ED Course  Procedures   DIAGNOSTIC STUDIES:  Oxygen Saturation is 100% on RA, normal by my interpretation.    COORDINATION OF CARE:  12:58 AM Will order telepsych consult. Discussed treatment plan with pt at bedside and pt agreed to plan.  Labs Review Labs Reviewed  ACETAMINOPHEN LEVEL  COMPREHENSIVE METABOLIC PANEL  ETHANOL  SALICYLATE LEVEL  URINE RAPID DRUG SCREEN, HOSP PERFORMED  CBC WITH DIFFERENTIAL/PLATELET    Imaging Review No results found.   EKG Interpretation None      MDM   Final diagnoses:  None   Patient presents emergency department for suicidal and homicidal ideations. He is very noncompliant for history of present illness and physical exam. He is refusing all blood draws. Frankly, I do not believe the patient needs blood work. He has no medical complaints and is otherwise been in his normal state of health besides suicidal ideation. Will obtain TTS consult. I have informed the patient that he may require blood work if he meets inpatient criteria. Patient demonstrated understanding. Will wait for TTS consultation.  Patient is refusing to speak with TTS.  Patient will require clearance prior to DC.  I personally performed the services described in this documentation, which was scribed in my  presence. The recorded information has been reviewed and is accurate.    Everlene Balls, MD 01/17/15 1626

## 2015-01-18 NOTE — ED Notes (Signed)
Report called to [facility], carol RN

## 2015-01-18 NOTE — ED Provider Notes (Signed)
Pt accepted to [facility] by Dr. Ricky Stabs, MD 01/18/15 1048

## 2015-01-18 NOTE — ED Notes (Signed)
Pt was served IVC papers from [facility] and sheriff is hear for transport.  This RN let MD know to update emtala.

## 2015-01-18 NOTE — ED Notes (Signed)
Awaiting transport from [place].

## 2015-01-18 NOTE — ED Notes (Signed)
Left Sheriff voice mail for transporting pt to [facility] after 9:00am.

## 2015-01-18 NOTE — ED Notes (Signed)
Sheriff called and stated that they will be here to pick up pt for transport to [facility] around 11-11:30am.

## 2015-01-18 NOTE — ED Notes (Signed)
Patient resting. Still refusing to speak and allow to take vitals.

## 2015-04-20 ENCOUNTER — Emergency Department (HOSPITAL_COMMUNITY)
Admission: EM | Admit: 2015-04-20 | Discharge: 2015-04-22 | Payer: BLUE CROSS/BLUE SHIELD | Attending: Emergency Medicine | Admitting: Emergency Medicine

## 2015-04-20 ENCOUNTER — Encounter (HOSPITAL_COMMUNITY): Payer: Self-pay | Admitting: Emergency Medicine

## 2015-04-20 DIAGNOSIS — F3164 Bipolar disorder, current episode mixed, severe, with psychotic features: Secondary | ICD-10-CM | POA: Diagnosis present

## 2015-04-20 DIAGNOSIS — R45851 Suicidal ideations: Secondary | ICD-10-CM | POA: Diagnosis present

## 2015-04-20 DIAGNOSIS — Z720 Tobacco use: Secondary | ICD-10-CM | POA: Insufficient documentation

## 2015-04-20 DIAGNOSIS — Z880 Allergy status to penicillin: Secondary | ICD-10-CM | POA: Diagnosis not present

## 2015-04-20 DIAGNOSIS — G40909 Epilepsy, unspecified, not intractable, without status epilepticus: Secondary | ICD-10-CM | POA: Diagnosis not present

## 2015-04-20 DIAGNOSIS — J45909 Unspecified asthma, uncomplicated: Secondary | ICD-10-CM | POA: Insufficient documentation

## 2015-04-20 DIAGNOSIS — F1510 Other stimulant abuse, uncomplicated: Secondary | ICD-10-CM | POA: Diagnosis present

## 2015-04-20 LAB — CBC WITH DIFFERENTIAL/PLATELET
Basophils Absolute: 0 10*3/uL (ref 0.0–0.1)
Basophils Relative: 0 %
Eosinophils Absolute: 0.3 10*3/uL (ref 0.0–0.7)
Eosinophils Relative: 3 %
HCT: 45.5 % (ref 39.0–52.0)
Hemoglobin: 15.3 g/dL (ref 13.0–17.0)
Lymphocytes Relative: 21 %
Lymphs Abs: 2.1 10*3/uL (ref 0.7–4.0)
MCH: 28.4 pg (ref 26.0–34.0)
MCHC: 33.6 g/dL (ref 30.0–36.0)
MCV: 84.4 fL (ref 78.0–100.0)
Monocytes Absolute: 1.4 10*3/uL — ABNORMAL HIGH (ref 0.1–1.0)
Monocytes Relative: 14 %
Neutro Abs: 6.2 10*3/uL (ref 1.7–7.7)
Neutrophils Relative %: 62 %
Platelets: 273 10*3/uL (ref 150–400)
RBC: 5.39 MIL/uL (ref 4.22–5.81)
RDW: 12.3 % (ref 11.5–15.5)
WBC: 10 10*3/uL (ref 4.0–10.5)

## 2015-04-20 LAB — ETHANOL: Alcohol, Ethyl (B): <5 mg/dL (ref <5)

## 2015-04-20 LAB — COMPREHENSIVE METABOLIC PANEL
ALT: 33 U/L (ref 17–63)
AST: 29 U/L (ref 15–41)
Albumin: 4.6 g/dL (ref 3.5–5.0)
Alkaline Phosphatase: 87 U/L (ref 38–126)
Anion gap: 9 (ref 5–15)
BUN: 14 mg/dL (ref 6–20)
CO2: 25 mmol/L (ref 22–32)
Calcium: 9.7 mg/dL (ref 8.9–10.3)
Chloride: 103 mmol/L (ref 101–111)
Creatinine, Ser: 0.79 mg/dL (ref 0.61–1.24)
GFR calc Af Amer: >60 mL/min (ref >60)
GFR calc non Af Amer: >60 mL/min (ref >60)
Glucose, Bld: 101 mg/dL — ABNORMAL HIGH (ref 65–99)
Potassium: 3.7 mmol/L (ref 3.5–5.1)
Sodium: 137 mmol/L (ref 135–145)
Total Bilirubin: 0.7 mg/dL (ref 0.3–1.2)
Total Protein: 8.1 g/dL (ref 6.5–8.1)

## 2015-04-20 LAB — ACETAMINOPHEN LEVEL: Acetaminophen (Tylenol), Serum: <10 ug/mL — ABNORMAL LOW (ref 10–30)

## 2015-04-20 LAB — SALICYLATE LEVEL: Salicylate Lvl: <4 mg/dL (ref 2.8–30.0)

## 2015-04-20 MED ORDER — IBUPROFEN 200 MG PO TABS: 600.0000 mg | ORAL_TABLET | Freq: Three times a day (TID) | ORAL | Status: DC | PRN

## 2015-04-20 MED ORDER — LORAZEPAM 1 MG PO TABS: 1.0000 mg | ORAL_TABLET | Freq: Three times a day (TID) | ORAL | Status: DC | PRN

## 2015-04-20 MED ORDER — ALUM & MAG HYDROXIDE-SIMETH 200-200-20 MG/5ML PO SUSP: 30.0000 mL | ORAL | Status: DC | PRN

## 2015-04-20 MED ORDER — NICOTINE 21 MG/24HR TD PT24: 21.0000 mg | MEDICATED_PATCH | Freq: Every day | TRANSDERMAL | Status: DC

## 2015-04-20 MED ORDER — GABAPENTIN 300 MG PO CAPS
300.0000 mg | ORAL_CAPSULE | Freq: Three times a day (TID) | ORAL | Status: DC
  Filled 2015-04-20: qty 1

## 2015-04-20 MED ORDER — CARBAMAZEPINE 200 MG PO TABS
200.0000 mg | ORAL_TABLET | Freq: Two times a day (BID) | ORAL | Status: DC
  Filled 2015-04-20 (×5): qty 1

## 2015-04-20 MED ORDER — ACETAMINOPHEN 325 MG PO TABS: 650.0000 mg | ORAL_TABLET | ORAL | Status: DC | PRN

## 2015-04-20 MED ORDER — FLUPHENAZINE HCL 5 MG PO TABS
5.0000 mg | ORAL_TABLET | Freq: Two times a day (BID) | ORAL | Status: DC
  Filled 2015-04-20 (×5): qty 1

## 2015-04-20 MED ORDER — ONDANSETRON HCL 4 MG PO TABS: 4.0000 mg | ORAL_TABLET | Freq: Three times a day (TID) | ORAL | Status: DC | PRN

## 2015-04-20 MED ORDER — AMANTADINE HCL 100 MG PO CAPS
100.0000 mg | ORAL_CAPSULE | Freq: Two times a day (BID) | ORAL | Status: DC
  Filled 2015-04-20 (×5): qty 1

## 2015-04-20 MED ORDER — ZOLPIDEM TARTRATE 5 MG PO TABS: 5.0000 mg | ORAL_TABLET | Freq: Every evening | ORAL | Status: DC | PRN

## 2015-04-20 NOTE — ED Notes (Signed)
Pt reports to EMT Joy while she was drawing his blood that if he had children he would sacrifice them.

## 2015-04-20 NOTE — ED Notes (Addendum)
Pt was asked to  Change into papers scrubs several times.Pt refuses. Pt just stares at this RN.  [employer] will remain at bedside. Awaiting for IVC papers.

## 2015-04-20 NOTE — ED Notes (Signed)
Per IVC papers pt called 911 saying he was going to kill self. [employer] arrived and patient had a box cutter. Pt told [employer] that he was from hell and wanted to leave this world. Per IVC papers Pt threatened to kill people at the hospital.

## 2015-04-20 NOTE — ED Notes (Signed)
IVC papers have been delivered.

## 2015-04-20 NOTE — ED Provider Notes (Signed)
CSN: [id number]     Arrival date & time 04/20/15  1901 History   First MD Initiated Contact with Patient 04/20/15 1909     Chief Complaint  Patient presents with  . Suicidal     (Consider location/radiation/quality/duration/timing/severity/associated sxs/prior Treatment) The history is provided by the patient and medical records.   19 y.o. M with hx of asthma, seizures, depression, bipolar disorder, here under IVC petitioned by [employer].  Patient is unwilling to cooperate with history, therefore history obtained via [employer].  Per [employer] officers, they received call that patient was suicidal and wanting to kill himself.  [employer] arrived and patient was sitting in a back alley alone with box cutter blade open.  He was not holding this directly to his wrist.  he apparently told [employer] that he "wanted to go to the other side because that's where the only people that love him are, aside from the saints."  Patient reports that he lives in "hell". Patient brought here and has been very uncooperative.  He will not answer questions when asked.  I asked patient to let us check vitals, he states "if you let me eat someone's flesh first".    Past Medical History  Diagnosis Date  . Asthma   . Seizures   . Brain ventricular shunt obstruction     hydrochelpis  . Depression   . Bipolar disorder    Past Surgical History  Procedure Laterality Date  . Tonsillectomy    . Ventriculo-peritoneal shunt placement / laparoscopic insertion peritoneal catheter     No family history on file. Social History  Substance Use Topics  . Smoking status: Current Every Day Smoker -- 1.00 packs/day    Types: Cigarettes  . Smokeless tobacco: Not on file  . Alcohol Use: 7.2 oz/week    12 Cans of beer per week     Comment: 4-8 40 oz.on the weekend    Review of Systems  Psychiatric/Behavioral: Positive for suicidal ideas.  All other systems reviewed and are negative.     Allergies  Amoxicillin and Penicillins  Home Medications    Prior to Admission medications   Medication Sig Start Date End Date Taking? Authorizing Provider  amantadine (SYMMETREL) 100 MG capsule Take 1 capsule (100 mg total) by mouth 2 (two) times daily. Patient not taking: Reported on 01/17/2015 12/16/14   Patrecia Pour, NP  carbamazepine (TEGRETOL) 200 MG tablet Take 1 tablet (200 mg total) by mouth 2 (two) times daily. Patient not taking: Reported on 01/17/2015 12/16/14   Patrecia Pour, NP  fluPHENAZine (PROLIXIN) 5 MG tablet Take 1 tablet (5 mg total) by mouth 2 (two) times daily. Patient not taking: Reported on 01/17/2015 12/16/14   Patrecia Pour, NP  gabapentin (NEURONTIN) 300 MG capsule Take 1 capsule (300 mg total) by mouth 3 (three) times daily. Patient not taking: Reported on 01/17/2015 12/16/14   Patrecia Pour, NP   BP 160/79 mmHg  Pulse 99  Temp(Src) 98.6 F (37 C) (Oral)  Resp 20  SpO2 97%   Physical Exam  Constitutional: He is oriented to person, place, and time. He appears well-developed and well-nourished. No distress.  uncooperative  HENT:  Head: Normocephalic and atraumatic.  Mouth/Throat: Oropharynx is clear and moist.  Eyes: Conjunctivae and EOM are normal. Pupils are equal, round, and reactive to light.  Neck: Normal range of motion. Neck supple.  Cardiovascular: Normal rate, regular rhythm and normal heart sounds.   Pulmonary/Chest: Effort normal and breath sounds normal. No  respiratory distress. He has no wheezes.  Abdominal: Soft. Bowel sounds are normal. There is no tenderness. There is no guarding.  Musculoskeletal: Normal range of motion. He exhibits no edema.  Neurological: He is alert and oriented to person, place, and time.  Skin: Skin is warm and dry. He is not diaphoretic.  Psychiatric: His affect is angry. He is aggressive. He is not actively hallucinating. He expresses suicidal ideation. He expresses no homicidal ideation. He expresses suicidal plans. He expresses no homicidal plans. He is noncommunicative.   Patient appears angry and aggressive  Nursing note and vitals reviewed.   ED Course  Procedures (including critical care time) Labs Review Labs Reviewed  CBC WITH DIFFERENTIAL/PLATELET - Abnormal; Notable for the following:    Monocytes Absolute 1.4 (*)    All other components within normal limits  COMPREHENSIVE METABOLIC PANEL - Abnormal; Notable for the following:    Glucose, Bld 101 (*)    All other components within normal limits  ACETAMINOPHEN LEVEL - Abnormal; Notable for the following:    Acetaminophen (Tylenol), Serum <10 (*)    All other components within normal limits  ETHANOL  SALICYLATE LEVEL  URINE RAPID DRUG SCREEN, HOSP PERFORMED    Imaging Review No results found. I have personally reviewed and evaluated these lab results as part of my medical decision-making.   EKG Interpretation None      MDM   Final diagnoses:  Suicidal ideation   19 year old male here under IVC petitioned by police. Patient was found with open box cutter in hand, admitted to [place].  Patient is uncooperative here, will not answer questions when asked.  He appears angry and aggressive, however has not made any attempts/threats to staff.  Lab work was obtained, no acute abnormalities. Patient denies complaints currently.  Patient medically cleared.  TTS has attempted to evaluate, however would not cooperate.  Patient will be held in the ED and evaluated by psychiatry in the morning.  Home meds and PRN meds ordered.  Larene Pickett, PA-C 04/20/15 [place], MD 04/20/15 [phone]

## 2015-04-20 NOTE — BH Assessment (Addendum)
Tele Assessment Note   Justin Chaney is an 19 y.o. male presenting to [facility] after being petitioned for involuntary commitment by [employer].  Petition states that pt contacted 911 and stated that he was going to kill himself and when [employer] arrived pt had a box cutter. Pt also informed them that he was from hell and wanted to leave this world. PT did not participate in this assessment only stared at the wall when this writer asked him questions. Information was gathered from nursing notes which reported that pt requested to eat someone's flesh when asked if his vitals could be taken. It has also been reported that pt wanted to go to the other side because that's where the only people that love him are.  Inpatient treatment is recommended for psychiatric stabilization.   Axis I: Schizoaffective Disorder  Past Medical History:  Past Medical History  Diagnosis Date  . Asthma   . Seizures   . Brain ventricular shunt obstruction     hydrochelpis  . Depression   . Bipolar disorder     Past Surgical History  Procedure Laterality Date  . Tonsillectomy    . Ventriculo-peritoneal shunt placement / laparoscopic insertion peritoneal catheter      Family History: No family history on file.  Social History:  reports that he has been smoking Cigarettes.  He has been smoking about 1.00 pack per day. He does not have any smokeless tobacco history on file. He reports that he drinks about 7.2 oz of alcohol per week. He reports that he uses illicit drugs (Marijuana, Other-see comments, and Amphetamines).  Additional Social History:  Alcohol / Drug Use History of alcohol / drug use?:  (Unable to assess.)  CIWA: CIWA-Ar BP: 160/79 mmHg Pulse Rate: 99 COWS:    PATIENT STRENGTHS: (choose at least two) Physical Health  Allergies:  Allergies  Allergen Reactions  . Amoxicillin Hives  . Penicillins Hives and Rash    Home Medications:  (Not in a hospital admission)  OB/GYN Status:  No LMP for male  patient.  General Assessment Data Location of Assessment: [facility] TTS Assessment: In system Is this a Tele or Face-to-Face Assessment?: Face-to-Face Is this an Initial Assessment or a Re-assessment for this encounter?: Initial Assessment Marital status: Single Living Arrangements: Alone, Non-relatives/Friends Can pt return to current living arrangement?: Yes Admission Status: Involuntary Is patient capable of signing voluntary admission?: Yes Referral Source: Other ([employer] ) Insurance type: [employer]     Crisis Care Plan Living Arrangements: Alone, Non-relatives/Friends Name of Psychiatrist: Unable to assess  Name of Therapist: Unable to assess   Education Status Is patient currently in school?: No  Risk to self with the past 6 months Suicidal Ideation: Yes-Currently Present Has patient been a risk to self within the past 6 months prior to admission? : Yes Suicidal Intent: Yes-Currently Present Has patient had any suicidal intent within the past 6 months prior to admission? : Yes Is patient at risk for suicide?: Yes Suicidal Plan?: Yes-Currently Present Has patient had any suicidal plan within the past 6 months prior to admission? : Yes Specify Current Suicidal Plan: Pt was found with a box cutter.  Access to Means: Yes Specify Access to Suicidal Means: Pt was found with a box cutter.  What has been your use of drugs/alcohol within the last 12 months?: Unable to assess.  Previous Attempts/Gestures:  (Unable to assess ) How many times?:  (Unable to assess) Other Self Harm Risks:  (Unable to assess )  Triggers for Past Attempts:  (Unable to assess ) Intentional Self Injurious Behavior:  (Unable to assess ) Family Suicide History: Unable to assess Recent stressful life event(s):  (Unable to assess) Persecutory voices/beliefs?:  (Unable to assess ) Depression:  (Unable to assess ) Depression Symptoms:  (Unable to assess ) Substance abuse history and/or treatment for  substance abuse?:  (Unable to assess ) Suicide prevention information given to non-admitted patients: Not applicable  Risk to Others within the past 6 months Homicidal Ideation:  (Unable to assess ) Does patient have any lifetime risk of violence toward others beyond the six months prior to admission? : Unknown Thoughts of Harm to Others:  (Unable to assess ) Current Homicidal Intent:  (Unable to assess ) Current Homicidal Plan:  (Unable to assess ) Access to Homicidal Means:  (Unable to assess ) Identified Victim:  (Unable to assess ) History of harm to others?:  (Unable to assess ) Assessment of Violence:  (Unable to assess ) Violent Behavior Description:  (Unable to assess ) Does patient have access to weapons?:  (Unable assess ) Criminal Charges Pending?:  (Unable to assess ) Does patient have a court date:  (Unable to assess ) Is patient on probation?: Unknown  Psychosis Hallucinations:  (Unable to assess ) Delusions:  (Unable to assess )  Mental Status Report Appearance/Hygiene: In scrubs Eye Contact: Poor Motor Activity: Unable to assess Speech: Unable to assess Level of Consciousness: Quiet/awake Mood:  (Unable to assess ) Affect: Unable to Assess Anxiety Level:  (unable to assess ) Thought Processes: Unable to Assess Judgement: Unable to Assess Orientation: Unable to assess Obsessive Compulsive Thoughts/Behaviors: Unable to Assess  Cognitive Functioning Concentration: Unable to Assess Memory: Unable to Assess IQ:  (Unable to assess ) Insight: Unable to Assess Impulse Control: Unable to Assess Appetite:  (Unable to assess ) Weight Loss:  (Unable to assess ) Weight Gain:  (Unable to assess ) Sleep: Unable to Assess Total Hours of Sleep:  (Unable to assess ) Vegetative Symptoms: Unable to Assess  ADLScreening [facility] Assessment Services) Patient's cognitive ability adequate to safely complete daily activities?: Yes Patient able to express need for assistance  with ADLs?: Yes Independently performs ADLs?: Yes (appropriate for developmental age)  Prior Inpatient Therapy Prior Inpatient Therapy: Yes Prior Therapy Dates: 2015 Prior Therapy Facilty/Provider(s): [facility]  Reason for Treatment: Amphetamines or stimulatn drug abuse, Intermittentent explosive disorder, schizoaffective disorder, bipolar type   Prior Outpatient Therapy Prior Outpatient Therapy: Yes Prior Therapy Dates: 2015 Prior Therapy Facilty/Provider(s): [facility]  Reason for Treatment: Schizoaffective disorder Does patient have an ACCT team?: Unknown Does patient have Intensive In-House Services?  : No Does patient have [facility] services? : Unknown Does patient have P4CC services?: Unknown  ADL Screening (condition at time of admission) Patient's cognitive ability adequate to safely complete daily activities?: Yes Patient able to express need for assistance with ADLs?: Yes Independently performs ADLs?: Yes (appropriate for developmental age)       Abuse/Neglect Assessment (Assessment to be complete while patient is alone) Physical Abuse:  (unable to assess ) Verbal Abuse:  (unable to assess) Sexual Abuse:  (unable to assess ) Exploitation of patient/patient's resources:  (unable to assess) Self-Neglect:  (unable to assess )     Advance Directives (For Healthcare) Does patient have an advance directive?: No Would patient like information on creating an advanced directive?: No - patient declined information    Additional Information 1:1 In Past 12 Months?: No CIRT Risk: No Elopement Risk:  No Does patient have medical clearance?: Yes     Disposition:  Disposition Initial Assessment Completed for this Encounter: Yes Disposition of Patient: Inpatient treatment program Type of inpatient treatment program: Adult  Hiran Leard S 04/20/2015 9:35 PM

## 2015-04-20 NOTE — ED Notes (Signed)
Pt. Made aware of the need for a urine sample.

## 2015-04-20 NOTE — ED Notes (Addendum)
Pt BIB [employer] with a c/o wanting to kill self. Upon [employer] arrival to patient, pt had an open box cutter.Pt refuses to answer questions from this RN. Pt has  Glaring stare towards this RN when questions are asked.  Pt told PA Lattie Haw that he wanted to "go to the other side" because that's the only people that love him. He reports that "he lives in hell".  Lattie Haw PA asked if staff could obtain vitals and he states " if you let me eat somebody's flesh first". [employer] at bedside and will remain at bedside. IVC papers in process by [employer].

## 2015-04-20 NOTE — BH Assessment (Signed)
Inpatient treatment is recommended. Quincy Carnes, PA-C has been informed of the recommendation.

## 2015-04-20 NOTE — ED Notes (Signed)
PT REFUSING LAB DRAW. PT NOT YET SERVED WITH IVC PAPERS AT THIS TIME. WILL ATTEMPT LAB DRAW ONCE PT IS SERVED WITH IVC PAPERS. RN [facility] NOTIFIED

## 2015-04-20 NOTE — ED Notes (Signed)
Pt. To [facility] from ED ambulatory without difficulty, to room 35. Pt. Is alert and oriented, warm and dry in no acute distress. Pt. Refuses to answer questions or discuss his situation stating "it's private". Pt. Made aware of security cameras and Q15 minute rounds. Pt. Encouraged to let Nursing staff know of any concerns or needs.

## 2015-04-20 NOTE — ED Notes (Signed)
Pt. Noted sleeping in room. No complaints or concerns voiced. No distress or abnormal behavior noted. Will continue to monitor with security cameras. Q 15 minute rounds continue. 

## 2015-04-21 DIAGNOSIS — F3164 Bipolar disorder, current episode mixed, severe, with psychotic features: Secondary | ICD-10-CM | POA: Diagnosis not present

## 2015-04-21 NOTE — ED Notes (Signed)
Pt. Noted sleeping in room. No complaints or concerns voiced. No distress or abnormal behavior noted. Will continue to monitor with security cameras. Q 15 minute rounds continue. 

## 2015-04-21 NOTE — BH Assessment (Signed)
[facility] Assessment Progress Note  The following facilities have been contacted to seek placement for this pt, with results as noted:  Beds available, information sent, decision pending:  [facility]   At capacity:  [facility]    Jalene Mullet, [place] Triage Specialist [phone]

## 2015-04-21 NOTE — ED Notes (Signed)
Pt AAO x 3, no distress noted, calm & cooperative, monitoring for safety, Q 15 min checks in effect. 

## 2015-04-21 NOTE — BHH Counselor (Signed)
Pt accepted to [facility]. Accepting physician Dr. Roda Shutters. Number to call report [phone]. Can be transported after breakfast tomorrow. RN notified.   Bedelia Person, M.S., LPCA, [place], [facility] Licensed Professional Counselor Associate  Triage Specialist  [facility]  Therapeutic Triage Services Phone: [phone] Fax: [phone]

## 2015-04-21 NOTE — Consult Note (Signed)
[facility] Face-to-Face Psychiatry Consult   Reason for Consult:  Psychosis Referring Physician:  EDP Patient Identification: Per Beagley MRN:  [id number] Principal Diagnosis: Bipolar affective disorder, mixed, severe, with psychotic behavior Diagnosis:   Patient Active Problem List   Diagnosis Date Noted  . Bipolar affective disorder, mixed, severe, with psychotic behavior [F31.64] 10/17/2014    Priority: High  . Substance induced mood disorder [F19.94] 02/08/2014    Priority: High  . Suicidal ideations [R45.851] 12/31/2013    Priority: High  . Homicidal ideations [R45.850] 12/31/2013    Priority: High  . 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 Suicide ideation [R45.851] 01/17/2015  . Non compliance w medication regimen [Z91.14] 01/17/2015  . Cannabis abuse [F12.10] 08/15/2012    Total Time spent with patient: 45 minutes  Subjective:   Justin Chaney is a 19 y.o. male patient admitted with psychosis.  HPI:  On admission:  19 y.o. male presenting to [facility] after being petitioned for involuntary commitment by [employer]. Petition states that pt contacted 911 and stated that he was going to kill himself and when [employer] arrived pt had a box cutter. Pt also informed them that he was from hell and wanted to leave this world. PT did not participate in this assessment only stared at the wall when this writer asked him questions. Information was gathered from nursing notes which reported that pt requested to eat someone's flesh when asked if his vitals could be taken. It has also been reported that pt wanted to go to the other side because that's where the only people that love him are.  Today:  Patient would not talk to the providers.  HPI Elements:   Location:  generalized. Quality:  acute. Severity:  severe. Timing:  constant. Duration:  couple of  days. Context:  amphetamine abuse.  Past Medical History:  Past Medical History  Diagnosis Date  . Asthma   . Seizures   . Brain ventricular shunt obstruction     hydrochelpis  . Depression   . Bipolar disorder     Past Surgical History  Procedure Laterality Date  . Tonsillectomy    . Ventriculo-peritoneal shunt placement / laparoscopic insertion peritoneal catheter     Family History: No family history on file. Social History:  History  Alcohol Use  . 7.2 oz/week  . 12 Cans of beer per week    Comment: 4-8 40 oz.on the weekend     History  Drug Use  . Yes  . Special: Marijuana, Other-see comments, Amphetamines    Comment: Adderall, "I don't do weed, only rarely"    Social History   Social History  . Marital Status: Single    Spouse Name: N/A  . Number of Children: N/A  . Years of Education: N/A   Social History Main Topics  . Smoking status: Current Every Day Smoker -- 1.00 packs/day    Types: Cigarettes  . Smokeless tobacco: None  . Alcohol Use: 7.2 oz/week    12 Cans of beer per week     Comment: 4-8 40 oz.on the weekend  . Drug Use: Yes    Special: Marijuana, Other-see comments, Amphetamines     Comment: Adderall, "I don't do weed, only rarely"  . Sexual Activity: Not Asked   Other Topics Concern  . None   Social History Narrative   Additional Social History:  History of alcohol / drug use?:  (Unable to assess.)                     Allergies:   Allergies  Allergen Reactions  . Amoxicillin Hives  . Penicillins Hives and Rash    Labs:  Results for orders placed or performed during the hospital encounter of 04/20/15 (from the past 48 hour(s))  CBC with Differential     Status: Abnormal   Collection Time: 04/20/15  7:45 PM  Result Value Ref Range   WBC 10.0 4.0 - 10.5 K/uL   RBC 5.39 4.22 - 5.81 MIL/uL   Hemoglobin 15.3 13.0 - 17.0 g/dL   HCT 45.5 39.0 - 52.0 %   MCV 84.4 78.0 - 100.0 fL   MCH 28.4 26.0 - 34.0 pg   MCHC 33.6  30.0 - 36.0 g/dL   RDW 12.3 11.5 - 15.5 %   Platelets 273 150 - 400 K/uL   Neutrophils Relative % 62 %   Neutro Abs 6.2 1.7 - 7.7 K/uL   Lymphocytes Relative 21 %   Lymphs Abs 2.1 0.7 - 4.0 K/uL   Monocytes Relative 14 %   Monocytes Absolute 1.4 (H) 0.1 - 1.0 K/uL   Eosinophils Relative 3 %   Eosinophils Absolute 0.3 0.0 - 0.7 K/uL   Basophils Relative 0 %   Basophils Absolute 0.0 0.0 - 0.1 K/uL  Comprehensive metabolic panel     Status: Abnormal   Collection Time: 04/20/15  7:45 PM  Result Value Ref Range   Sodium 137 135 - 145 mmol/L   Potassium 3.7 3.5 - 5.1 mmol/L   Chloride 103 101 - 111 mmol/L   CO2 25 22 - 32 mmol/L   Glucose, Bld 101 (H) 65 - 99 mg/dL   BUN 14 6 - 20 mg/dL   Creatinine, Ser 0.79 0.61 - 1.24 mg/dL   Calcium 9.7 8.9 - 10.3 mg/dL   Total Protein 8.1 6.5 - 8.1 g/dL   Albumin 4.6 3.5 - 5.0 g/dL   AST 29 15 - 41 U/L   ALT 33 17 - 63 U/L   Alkaline Phosphatase 87 38 - 126 U/L   Total Bilirubin 0.7 0.3 - 1.2 mg/dL   GFR calc non Af Amer >60 >60 mL/min   GFR calc Af Amer >60 >60 mL/min    Comment: (NOTE) The eGFR has been calculated using the CKD EPI equation. This calculation has not been validated in all clinical situations. eGFR's persistently <60 mL/min signify possible Chronic Kidney Disease.    Anion gap 9 5 - 15  Ethanol     Status: None   Collection Time: 04/20/15  7:46 PM  Result Value Ref Range   Alcohol, Ethyl (B) <5 <5 mg/dL    Comment:        LOWEST DETECTABLE LIMIT FOR SERUM ALCOHOL IS 5 mg/dL FOR MEDICAL PURPOSES ONLY   Salicylate level     Status: None   Collection Time: 04/20/15  7:46 PM  Result Value Ref Range   Salicylate Lvl <1.6 2.8 - 30.0 mg/dL  Acetaminophen level     Status: Abnormal   Collection Time: 04/20/15  7:46 PM  Result Value Ref Range   Acetaminophen (Tylenol), Serum <10 (L) 10 - 30 ug/mL    Comment:        THERAPEUTIC CONCENTRATIONS VARY SIGNIFICANTLY. A RANGE OF 10-30 ug/mL MAY BE AN EFFECTIVE CONCENTRATION  FOR MANY PATIENTS. HOWEVER, SOME ARE BEST TREATED AT CONCENTRATIONS OUTSIDE  THIS RANGE. ACETAMINOPHEN CONCENTRATIONS >150 ug/mL AT 4 HOURS AFTER INGESTION AND >50 ug/mL AT 12 HOURS AFTER INGESTION ARE OFTEN ASSOCIATED WITH TOXIC REACTIONS.     Vitals: Blood pressure 160/79, pulse 99, temperature 98.6 F (37 C), temperature source Oral, resp. rate 20, SpO2 97 %.  Risk to Self: Suicidal Ideation: Yes-Currently Present Suicidal Intent: Yes-Currently Present Is patient at risk for suicide?: Yes Suicidal Plan?: Yes-Currently Present Specify Current Suicidal Plan: Pt was found with a box cutter.  Access to Means: Yes Specify Access to Suicidal Means: Pt was found with a box cutter.  What has been your use of drugs/alcohol within the last 12 months?: Unable to assess.  How many times?:  (Unable to assess) Other Self Harm Risks:  (Unable to assess ) Triggers for Past Attempts:  (Unable to assess ) Intentional Self Injurious Behavior:  (Unable to assess ) Risk to Others: Homicidal Ideation:  (Unable to assess ) Thoughts of Harm to Others:  (Unable to assess ) Current Homicidal Intent:  (Unable to assess ) Current Homicidal Plan:  (Unable to assess ) Access to Homicidal Means:  (Unable to assess ) Identified Victim:  (Unable to assess ) History of harm to others?:  (Unable to assess ) Assessment of Violence:  (Unable to assess ) Violent Behavior Description:  (Unable to assess ) Does patient have access to weapons?:  (Unable assess ) Criminal Charges Pending?:  (Unable to assess ) Does patient have a court date:  (Unable to assess ) Prior Inpatient Therapy: Prior Inpatient Therapy: Yes Prior Therapy Dates: 2015 Prior Therapy Facilty/Provider(s): [facility]  Reason for Treatment: Amphetamines or stimulatn drug abuse, Intermittentent explosive disorder, schizoaffective disorder, bipolar type  Prior Outpatient Therapy: Prior Outpatient Therapy: Yes Prior Therapy Dates: 2015 Prior  Therapy Facilty/Provider(s): [facility]  Reason for Treatment: Schizoaffective disorder Does patient have an ACCT team?: Unknown Does patient have Intensive In-House Services?  : No Does patient have [facility] services? : Unknown Does patient have P4CC services?: Unknown  Current Facility-Administered Medications  Medication Dose Route Frequency Provider Last Rate Last Dose  . acetaminophen (TYLENOL) tablet 650 mg  650 mg Oral Q4H PRN Larene Pickett, PA-C      . alum & mag hydroxide-simeth (MAALOX/MYLANTA) 200-200-20 MG/5ML suspension 30 mL  30 mL Oral PRN Larene Pickett, PA-C      . amantadine (SYMMETREL) capsule 100 mg  100 mg Oral BID Larene Pickett, PA-C   100 mg at 04/20/15 2144  . carbamazepine (TEGRETOL) tablet 200 mg  200 mg Oral BID Larene Pickett, PA-C   200 mg at 04/20/15 2144  . fluPHENAZine (PROLIXIN) tablet 5 mg  5 mg Oral BID Larene Pickett, PA-C   5 mg at 04/20/15 2145  . gabapentin (NEURONTIN) capsule 300 mg  300 mg Oral TID Larene Pickett, PA-C   300 mg at 04/20/15 2145  . ibuprofen (ADVIL,MOTRIN) tablet 600 mg  600 mg Oral Q8H PRN Larene Pickett, PA-C      . LORazepam (ATIVAN) tablet 1 mg  1 mg Oral Q8H PRN Larene Pickett, PA-C      . nicotine (NICODERM CQ - dosed in mg/24 hours) patch 21 mg  21 mg Transdermal Daily Larene Pickett, PA-C   21 mg at 04/20/15 2145  . ondansetron (ZOFRAN) tablet 4 mg  4 mg Oral Q8H PRN Larene Pickett, PA-C      . zolpidem [facility]) tablet 5 mg  5 mg Oral QHS PRN  Larene Pickett, PA-C       Current Outpatient Prescriptions  Medication Sig Dispense Refill  . amantadine (SYMMETREL) 100 MG capsule Take 1 capsule (100 mg total) by mouth 2 (two) times daily. (Patient not taking: Reported on 01/17/2015) 60 capsule 0  . carbamazepine (TEGRETOL) 200 MG tablet Take 1 tablet (200 mg total) by mouth 2 (two) times daily. (Patient not taking: Reported on 01/17/2015) 60 tablet 0  . fluPHENAZine (PROLIXIN) 5 MG tablet Take 1 tablet (5 mg total) by mouth 2 (two) times  daily. (Patient not taking: Reported on 01/17/2015) 60 tablet 0  . gabapentin (NEURONTIN) 300 MG capsule Take 1 capsule (300 mg total) by mouth 3 (three) times daily. (Patient not taking: Reported on 01/17/2015) 90 capsule 0    Musculoskeletal: Strength & Muscle Tone: within normal limits Gait & Station: normal Patient leans: N/A  Psychiatric Specialty Exam: Physical Exam  Review of Systems  Constitutional: Negative.   HENT: Negative.   Eyes: Negative.   Respiratory: Negative.   Cardiovascular: Negative.   Gastrointestinal: Negative.   Genitourinary: Negative.   Musculoskeletal: Negative.   Skin: Negative.   Neurological: Negative.   Endo/Heme/Allergies: Negative.   Psychiatric/Behavioral: Positive for depression, suicidal ideas, hallucinations and substance abuse.    Blood pressure 160/79, pulse 99, temperature 98.6 F (37 C), temperature source Oral, resp. rate 20, SpO2 97 %.There is no weight on file to calculate BMI.  General Appearance: Casual  Eye Contact::  Fair  Speech:  would not talk to the providers  Volume:  would not talk to the providers  Mood:  Depressed  Affect:  Flat  Thought Process:  unable to assess due to not talking to providers  Orientation:  Other:  unable to assess due to not talking to providers  Thought Content:  Hallucinations: Auditory Visual  Suicidal Thoughts:  unable to assess due to not talking to providers  Homicidal Thoughts:  unable to assess due to not talking to providers  Memory:  unable to assess due to not talking to providers  Judgement:  Other:  unable to assess due to not talking to providers  Insight:  unable to assess due to not talking to providers  Psychomotor Activity:  Decreased  Concentration:  unable to assess due to not talking to providers  Recall:  unable to assess due to not talking to providers  Fund of Knowledge:unable to assess due to not talking to providers  Language: unable to assess due to not talking to  providers  Akathisia:  unable to assess due to not talking to providers  Handed:  Right  AIMS (if indicated):     Assets:  Leisure Time Physical Health Resilience  ADL's:  Intact  Cognition: Impaired,  Moderate  Sleep:      Medical Decision Making: Review of Psycho-Social Stressors (1), Review or order clinical lab tests (1) and Review of Medication Regimen & Side Effects (2)  Treatment Plan Summary: Daily contact with patient to assess and evaluate symptoms and progress in treatment, Medication management and Plan :  Bipolar affective disorder, depressed, with psychotic features: -Crisis stabilization -Medication management:  Start Prolixin 5 mg BID for psychosis, Tegretol 200 mg BID for mood stabilization, Symmetrel 100 mg BID to prevent EPS  Plan:  Recommend psychiatric Inpatient admission when medically cleared. Disposition: Admit to inpatient psychiatric unit for stabilization  Waylan Boga, [place] 04/21/2015 12:06 PM Patient seen face-to-face for psychiatric evaluation, chart reviewed and case discussed with the physician extender and developed treatment  plan. Reviewed the information documented and agree with the treatment plan. Corena Pilgrim, MD

## 2015-04-21 NOTE — ED Notes (Signed)
Pt. Noted sleeping in room. No complaints or concerns voiced. No distress or abnormal behavior noted. Will continue to monitor with security cameras. Q 15 minute rounds continue. b

## 2015-04-21 NOTE — ED Notes (Signed)
Pt appears very irritable.  He came up to nurses station glaring at staff.  When [employer] officer asked his name to look him up he became more agitated and wanted to know why we asked him to search him.  Otherwise he refused to speak to staff.  15 minute checks continue and continuous video monitoring continues.

## 2015-04-22 NOTE — ED Notes (Signed)
Pt transferred to [facility].  Taken by [employer], ambulatory, all belongings were returned to pt.

## 2015-05-30 ENCOUNTER — Emergency Department (HOSPITAL_COMMUNITY)
Admission: EM | Admit: 2015-05-30 | Discharge: 2015-06-02 | Disposition: A | Discharge status: Home / Self Care | Payer: BLUE CROSS/BLUE SHIELD | Attending: Emergency Medicine | Admitting: Emergency Medicine

## 2015-05-30 ENCOUNTER — Encounter (HOSPITAL_COMMUNITY): Payer: Self-pay | Admitting: *Deleted

## 2015-05-30 DIAGNOSIS — Z720 Tobacco use: Secondary | ICD-10-CM | POA: Insufficient documentation

## 2015-05-30 DIAGNOSIS — F1410 Cocaine abuse, uncomplicated: Secondary | ICD-10-CM | POA: Insufficient documentation

## 2015-05-30 DIAGNOSIS — J45909 Unspecified asthma, uncomplicated: Secondary | ICD-10-CM | POA: Diagnosis not present

## 2015-05-30 DIAGNOSIS — F3130 Bipolar disorder, current episode depressed, mild or moderate severity, unspecified: Secondary | ICD-10-CM | POA: Diagnosis not present

## 2015-05-30 DIAGNOSIS — Z880 Allergy status to penicillin: Secondary | ICD-10-CM | POA: Insufficient documentation

## 2015-05-30 DIAGNOSIS — R45851 Suicidal ideations: Secondary | ICD-10-CM

## 2015-05-30 DIAGNOSIS — F1994 Other psychoactive substance use, unspecified with psychoactive substance-induced mood disorder: Secondary | ICD-10-CM

## 2015-05-30 DIAGNOSIS — F1494 Cocaine use, unspecified with cocaine-induced mood disorder: Secondary | ICD-10-CM | POA: Diagnosis not present

## 2015-05-30 DIAGNOSIS — R440 Auditory hallucinations: Secondary | ICD-10-CM | POA: Insufficient documentation

## 2015-05-30 DIAGNOSIS — R45850 Homicidal ideations: Secondary | ICD-10-CM | POA: Diagnosis not present

## 2015-05-30 DIAGNOSIS — F0630 Mood disorder due to known physiological condition, unspecified: Secondary | ICD-10-CM

## 2015-05-30 LAB — COMPREHENSIVE METABOLIC PANEL
ALT: 32 U/L (ref 17–63)
AST: 39 U/L (ref 15–41)
Albumin: 4.6 g/dL (ref 3.5–5.0)
Alkaline Phosphatase: 76 U/L (ref 38–126)
Anion gap: 11 (ref 5–15)
BUN: 12 mg/dL (ref 6–20)
CO2: 23 mmol/L (ref 22–32)
Calcium: 9.4 mg/dL (ref 8.9–10.3)
Chloride: 102 mmol/L (ref 101–111)
Creatinine, Ser: 0.98 mg/dL (ref 0.61–1.24)
GFR calc Af Amer: >60 mL/min (ref >60)
GFR calc non Af Amer: >60 mL/min (ref >60)
Glucose, Bld: 84 mg/dL (ref 65–99)
Potassium: 3.5 mmol/L (ref 3.5–5.1)
Sodium: 136 mmol/L (ref 135–145)
Total Bilirubin: 1 mg/dL (ref 0.3–1.2)
Total Protein: 7.3 g/dL (ref 6.5–8.1)

## 2015-05-30 LAB — RAPID URINE DRUG SCREEN, HOSP PERFORMED
Amphetamines: NOT DETECTED
Barbiturates: NOT DETECTED
Benzodiazepines: NOT DETECTED
Cocaine: POSITIVE — AB
Opiates: NOT DETECTED
Tetrahydrocannabinol: NOT DETECTED

## 2015-05-30 LAB — CBC
HCT: 44.5 % (ref 39.0–52.0)
Hemoglobin: 15.3 g/dL (ref 13.0–17.0)
MCH: 28.5 pg (ref 26.0–34.0)
MCHC: 34.4 g/dL (ref 30.0–36.0)
MCV: 83 fL (ref 78.0–100.0)
Platelets: 232 10*3/uL (ref 150–400)
RBC: 5.36 MIL/uL (ref 4.22–5.81)
RDW: 11.9 % (ref 11.5–15.5)
WBC: 8.6 10*3/uL (ref 4.0–10.5)

## 2015-05-30 LAB — SALICYLATE LEVEL: Salicylate Lvl: <4 mg/dL (ref 2.8–30.0)

## 2015-05-30 LAB — ETHANOL: Alcohol, Ethyl (B): <5 mg/dL (ref <5)

## 2015-05-30 LAB — ACETAMINOPHEN LEVEL: Acetaminophen (Tylenol), Serum: <10 ug/mL — ABNORMAL LOW (ref 10–30)

## 2015-05-30 NOTE — ED Notes (Signed)
Pt reports auditory hallucinations that are telling him to harm others and himself. Denies any attempts to harm himself pta.

## 2015-05-30 NOTE — Progress Notes (Addendum)
Patient under review at [facility].  Patient was referred for inpatient treatment at: [place]  CSW will continue to seek placement.  Verlon Setting, [place] Disposition staff 05/30/2015 10:50 PM

## 2015-05-30 NOTE — BH Assessment (Signed)
[place] Assessment Progress Note    Called and scheduled pt's tele assessment with this clinician as well as gathered clinical information from [place].   Shaune Pascal, MS, [facility] Therapeutic Triage Specialist [facility]

## 2015-05-30 NOTE — ED Provider Notes (Signed)
CSN: [id number]     Arrival date & time 05/30/15  1346 History  By signing my name below, I, Starleen Arms, attest that this documentation has been prepared under the direction and in the presence of [facility], PA-C. Electronically Signed: Starleen Arms ED Scribe. 05/30/2015. 3:17 PM.    Chief Complaint  Patient presents with  . Homicidal  . Medical Clearance   The history is provided by the patient. No language interpreter was used.   HPI Comments: Justin Chaney is a 19 y.o. male who presents to the Emergency Department complaining of command auditory hallucinations aimed generally toward everyone and SI without a plan (he reports "50/50" chance of suicide).  He states he has had these complaints "intermittently since my teens". He also reports there is a "war between the angles and demons and I know have to destroy it [the world] or save it."  He denies use of alcohol or regular illicit drug use but reports one instance of recent cocaine use.  He was previously prescribed medication for his bipolar disorder but no longer takes this medication because it does not work.     Past Medical History  Diagnosis Date  . Asthma   . Seizures ([place])   . Brain ventricular shunt obstruction     hydrochelpis  . Depression   . Bipolar disorder [facility])    Past Surgical History  Procedure Laterality Date  . Tonsillectomy    . Ventriculo-peritoneal shunt placement / laparoscopic insertion peritoneal catheter     History reviewed. No pertinent family history. Social History  Substance Use Topics  . Smoking status: Current Every Day Smoker -- 1.00 packs/day    Types: Cigarettes  . Smokeless tobacco: None  . Alcohol Use: 7.2 oz/week    12 Cans of beer per week     Comment: 4-8 40 oz.on the weekend    Review of Systems  Psychiatric/Behavioral: Positive for suicidal ideas and hallucinations.       HI  All other systems reviewed and are negative.     Allergies  Amoxicillin and  Penicillins  Home Medications   Prior to Admission medications   Medication Sig Start Date End Date Taking? Authorizing Provider  amantadine (SYMMETREL) 100 MG capsule Take 1 capsule (100 mg total) by mouth 2 (two) times daily. Patient not taking: Reported on 01/17/2015 12/16/14   Patrecia Pour, NP  carbamazepine (TEGRETOL) 200 MG tablet Take 1 tablet (200 mg total) by mouth 2 (two) times daily. Patient not taking: Reported on 01/17/2015 12/16/14   Patrecia Pour, NP  fluPHENAZine (PROLIXIN) 5 MG tablet Take 1 tablet (5 mg total) by mouth 2 (two) times daily. Patient not taking: Reported on 01/17/2015 12/16/14   Patrecia Pour, NP  gabapentin (NEURONTIN) 300 MG capsule Take 1 capsule (300 mg total) by mouth 3 (three) times daily. Patient not taking: Reported on 01/17/2015 12/16/14   Patrecia Pour, NP   BP 132/103 mmHg  Pulse 106  Temp(Src) 99.3 F (37.4 C) (Oral)  Resp 18  SpO2 98% Physical Exam  Constitutional: He is oriented to person, place, and time. He appears well-developed and well-nourished. No distress.  HENT:  Head: Normocephalic and atraumatic.  Eyes: Conjunctivae and EOM are normal.  Neck: Normal range of motion.  Cardiovascular: Normal rate and regular rhythm.  Exam reveals no gallop and no friction rub.   No murmur heard. Pulmonary/Chest: Effort normal and breath sounds normal. He has no wheezes. He has no rales. He exhibits  no tenderness.  Abdominal: Soft. He exhibits no distension. There is no tenderness. There is no rebound.  Musculoskeletal: Normal range of motion.  Neurological: He is alert and oriented to person, place, and time. Coordination normal.  Speech is goal-oriented. Moves limbs without ataxia.   Skin: Skin is warm and dry.  Psychiatric: His behavior is normal.  Nursing note and vitals reviewed.   ED Course  Procedures (including critical care time)  DIAGNOSTIC STUDIES: Oxygen Saturation is 98% on RA, normal by my interpretation.    COORDINATION OF  CARE:  3:29 PM Will order TTS consult.  Patient acknowledges and agrees with plan.    Labs Review Labs Reviewed  ACETAMINOPHEN LEVEL - Abnormal; Notable for the following:    Acetaminophen (Tylenol), Serum <10 (*)    All other components within normal limits  COMPREHENSIVE METABOLIC PANEL  ETHANOL  SALICYLATE LEVEL  CBC  URINE RAPID DRUG SCREEN, HOSP PERFORMED    Imaging Review No results found. I have personally reviewed and evaluated these images and lab results as part of my medical decision-making.   EKG Interpretation None      MDM   Final diagnoses:  Suicide ideation    Patient will be evaluated by TTS.   I personally performed the services described in this documentation, which was scribed in my presence. The recorded information has been reviewed and is accurate.    Alvina Chou, PA-C 06/03/15 0383  Orpah Greek, MD 06/05/15 [phone]

## 2015-05-30 NOTE — BH Assessment (Addendum)
Tele Assessment Note   Justin Chaney is an 19 y.o. male that is self-referred to [facility] reporting SI, HI and endorsing AVH and delusions.  Pt reported he has SI, no plan currently, and has had SI for two weeks.  At the beginning of September this year, pt wasn't sent to [facility] and prescribed Abilify.  Pt stated he stopped taking his medication 2 days ago because it does not work.  Pt reported he is "going to hurt somebody" and he stated "it could be anybody."  Pt did not identify a specific individual.  Pt reported that he hears voices telling him to kill himself, as well as has visual hallucinations, but pt did not want to elaborate on these.  He endorses delusions, stating that he is reincarnated, and that he is going to be in the war between [place].  Pt endorses depressive sx, including poor sleep and sad mood.  Pt has hx of SA, but denies currently, stating he only used one line of cocainePt has hx of inpatient psychiatric hospitalizations for similar sx.  Pt denies having a current outpatient provider.  Pt was cooperative, oriented x 3, had logical/coherent thought processes, good eye contact, normal speech, depressed mood, was in scrubs.  Pt stated he has no family support and lives with friends.  Inpatient psychiatric hospitalization is recommended for the pt for stabilization of sx.  Consulted with Samuel Jester, NP at [facility] who recommends inpatient treatment for the pt.  Updated EDP [place] and he was in agreement with pt disposition.  TTS and ED staff updated.  TTS to seek placement for the pt.  Diagnosis: 295.70 Schizoaffective Disorder, Bipolar Type  Past Medical History:  Past Medical History  Diagnosis Date  . Asthma   . Seizures ([place])   . Brain ventricular shunt obstruction     hydrochelpis  . Depression   . Bipolar disorder [facility])     Past Surgical History  Procedure Laterality Date  . Tonsillectomy    . Ventriculo-peritoneal shunt placement / laparoscopic insertion  peritoneal catheter      Family History: History reviewed. No pertinent family history.  Social History:  reports that he has been smoking Cigarettes.  He has been smoking about 1.00 pack per day. He does not have any smokeless tobacco history on file. He reports that he drinks about 7.2 oz of alcohol per week. He reports that he uses illicit drugs (Marijuana, Other-see comments, and Amphetamines).  Additional Social History:  Alcohol / Drug Use Pain Medications: none Prescriptions: see med list Over the Counter: none History of alcohol / drug use?: Yes Longest period of sobriety (when/how long): unk Negative Consequences of Use:  (na) Withdrawal Symptoms:  (na) Substance #1 Name of Substance 1: Cocaine 1 - Age of First Use: unk 1 - Amount (size/oz): unk 1 - Frequency: unk 1 - Duration: unk 1 - Last Use / Amount: last night - 1 line cocaine  CIWA: CIWA-Ar BP: (!) 132/103 mmHg Pulse Rate: 106 COWS:    PATIENT STRENGTHS: (choose at least two) Ability for insight Capable of independent living General fund of knowledge Motivation for treatment/growth  Allergies:  Allergies  Allergen Reactions  . Amoxicillin Hives  . Penicillins Hives and Rash    Home Medications:  (Not in a hospital admission)  OB/GYN Status:  No LMP for male patient.  General Assessment Data Location of Assessment: [facility] TTS Assessment: In system Is this a Tele or Face-to-Face Assessment?: Tele Assessment Is this  an Initial Assessment or a Re-assessment for this encounter?: Initial Assessment Marital status: Single Maiden name:  (na) Is patient pregnant?:  (na) Pregnancy Status:  (na) Living Arrangements: Alone, Non-relatives/Friends Can pt return to current living arrangement?: Yes Admission Status: Voluntary Is patient capable of signing voluntary admission?: Yes Referral Source: Self/Family/Friend Insurance type: [place] Screening Exam ([place]) Medical Exam completed:   (na)  Crisis Care Plan Living Arrangements: Alone, Non-relatives/Friends Name of Psychiatrist: None Name of Therapist: None  Education Status Is patient currently in school?: No Current Grade: na Highest grade of school patient has completed: 9 Name of school: unk Contact person: na  Risk to self with the past 6 months Suicidal Ideation: Yes-Currently Present Has patient been a risk to self within the past 6 months prior to admission? : Yes Suicidal Intent: No-Not Currently/Within Last 6 Months Has patient had any suicidal intent within the past 6 months prior to admission? : Yes Is patient at risk for suicide?: Yes Suicidal Plan?: No-Not Currently/Within Last 6 Months Has patient had any suicidal plan within the past 6 months prior to admission? : Yes Specify Current Suicidal Plan: pt denies plan currently Access to Means: No Specify Access to Suicidal Means: na-pt denies What has been your use of drugs/alcohol within the last 12 months?: pt reported he used one line of cocaine last night Previous Attempts/Gestures:  (pt would not elaborate, has threatened to commit suicide in ) How many times?:  (unk) Other Self Harm Risks: pt denies Triggers for Past Attempts: Unknown Intentional Self Injurious Behavior: None Family Suicide History: No Recent stressful life event(s): Other (Comment) (SI, HI, psychosis, off meds) Persecutory voices/beliefs?: Yes Depression: Yes Depression Symptoms: Despondent, Insomnia, Feeling worthless/self pity Substance abuse history and/or treatment for substance abuse?: Yes Suicide prevention information given to non-admitted patients: Not applicable  Risk to Others within the past 6 months Homicidal Ideation: No Does patient have any lifetime risk of violence toward others beyond the six months prior to admission? : Unknown Thoughts of Harm to Others: Yes-Currently Present Comment - Thoughts of Harm to Others: pt stated he was going ot hurt someone  ("it could be anybody" Current Homicidal Intent: No Current Homicidal Plan: No Access to Homicidal Means: No Identified Victim: na-pt denies History of harm to others?: No Assessment of Violence: None Noted Violent Behavior Description: na-pt denies Does patient have access to weapons?: No Criminal Charges Pending?: No Does patient have a court date: No Is patient on probation?: No  Psychosis Hallucinations: Auditory, Visual, With command (hears voices telling him to kill others, sees things) Delusions: Persecutory (believes he is reincarnated from ancient times, war between)  Mental Status Report Appearance/Hygiene: In scrubs Eye Contact: Good Motor Activity: Freedom of movement, Unremarkable Speech: Logical/coherent Level of Consciousness: Alert Mood: Depressed Affect: Appropriate to circumstance Anxiety Level: Moderate Thought Processes: Coherent, Relevant Judgement: Impaired Orientation: Person, Place, Situation Obsessive Compulsive Thoughts/Behaviors: None  Cognitive Functioning Concentration: Normal Memory: Recent Intact, Remote Intact IQ: Average Insight: Fair Impulse Control: Poor Appetite: Fair Weight Loss:  (unk) Weight Gain:  (unk) Sleep: Decreased Total Hours of Sleep:  (pt doesn't know) Vegetative Symptoms: None  ADLScreening [facility] Assessment Services) Patient's cognitive ability adequate to safely complete daily activities?: Yes Patient able to express need for assistance with ADLs?: Yes Independently performs ADLs?: Yes (appropriate for developmental age)  Prior Inpatient Therapy Prior Inpatient Therapy: Yes Prior Therapy Dates: 2016, 2015 Prior Therapy Facilty/Provider(s): [place], [facility] Reason for Treatment: Schizoaffective Disorder, Bipolar  Type  Prior Outpatient Therapy Prior Outpatient Therapy: Yes Prior Therapy Dates: 2015 Prior Therapy Facilty/Provider(s): [facility]  Reason for Treatment: Schizoaffective disorder Does patient have an  ACCT team?: No Does patient have Intensive In-House Services?  : No Does patient have [facility] services? : No Does patient have P4CC services?: No  ADL Screening (condition at time of admission) Patient's cognitive ability adequate to safely complete daily activities?: Yes Is the patient deaf or have difficulty hearing?: No Does the patient have difficulty seeing, even when wearing glasses/contacts?: No Does the patient have difficulty concentrating, remembering, or making decisions?: No Patient able to express need for assistance with ADLs?: Yes Does the patient have difficulty dressing or bathing?: No Independently performs ADLs?: Yes (appropriate for developmental age) Does the patient have difficulty walking or climbing stairs?: No  Home Assistive Devices/Equipment Home Assistive Devices/Equipment: None    Abuse/Neglect Assessment (Assessment to be complete while patient is alone) Physical Abuse: Denies Verbal Abuse: Yes, past (Comment) (from family and friends by report) Sexual Abuse: Denies Exploitation of patient/patient's resources: Denies Self-Neglect: Denies Values / Beliefs Cultural Requests During Hospitalization: None Spiritual Requests During Hospitalization: None Consults Spiritual Care Consult Needed: No Social Work Consult Needed: No Regulatory affairs officer (For Healthcare) Does patient have an advance directive?: No Would patient like information on creating an advanced directive?: No - patient declined information    Additional Information 1:1 In Past 12 Months?: No CIRT Risk: No Elopement Risk: No Does patient have medical clearance?: Yes     Disposition:  Disposition Initial Assessment Completed for this Encounter: Yes Disposition of Patient: Referred to, Inpatient treatment program Type of inpatient treatment program: Adult  Shaune Pascal, MS, [facility] Therapeutic Triage Specialist [facility]   05/30/2015 4:39 PM

## 2015-05-31 MED ORDER — ALUM & MAG HYDROXIDE-SIMETH 200-200-20 MG/5ML PO SUSP
30.0000 mL | ORAL | Status: DC | PRN
Start: 2015-05-31 — End: 2015-06-02

## 2015-05-31 MED ORDER — DIPHENHYDRAMINE HCL 25 MG PO CAPS: 25.0000 mg | ORAL_CAPSULE | Freq: Four times a day (QID) | ORAL | Status: DC | PRN

## 2015-05-31 MED ORDER — IBUPROFEN 400 MG PO TABS: 600.0000 mg | ORAL_TABLET | Freq: Three times a day (TID) | ORAL | Status: DC | PRN

## 2015-05-31 MED ORDER — ACETAMINOPHEN 325 MG PO TABS: 650.0000 mg | ORAL_TABLET | ORAL | Status: DC | PRN

## 2015-05-31 MED ORDER — NICOTINE 21 MG/24HR TD PT24: 21.0000 mg | MEDICATED_PATCH | Freq: Every day | TRANSDERMAL | Status: DC

## 2015-05-31 MED ORDER — LORAZEPAM 1 MG PO TABS
1.0000 mg | ORAL_TABLET | Freq: Three times a day (TID) | ORAL | Status: DC | PRN
  Administered 2015-06-02: 1 mg via ORAL
  Filled 2015-05-31: qty 1

## 2015-05-31 MED ORDER — ZOLPIDEM TARTRATE 5 MG PO TABS: 5.0000 mg | ORAL_TABLET | Freq: Every evening | ORAL | Status: DC | PRN

## 2015-05-31 NOTE — ED Notes (Signed)
Pt. Continues to sleep comfortably.

## 2015-05-31 NOTE — ED Notes (Signed)
States was on Abilify x 1 week and d/c'd himself approx 2-3 days ago d/t states was not helping. Voices wants to inpt tx.

## 2015-06-01 NOTE — ED Notes (Signed)
Pt ambulatory to nurses' desk asking for graham crackers - advised pt lunch will be here soon. Pt then stated he would wait.

## 2015-06-01 NOTE — ED Notes (Signed)
TTS being performed.  

## 2015-06-01 NOTE — ED Notes (Signed)
Pt ambulatory in hallway. States was upset d/t he did not want to have to talk w/[facility] Counselor and repeat himself over and over. States he initially had advised her he was not going to talk then stated he finally did. States he is frustrated and just wants to go to a place that is going to get him on the right meds. RN allowed pt to vent feelings. Pt noted to appear much calmer.

## 2015-06-01 NOTE — [facility] Counselor (Signed)
Writer conducted [employer] with pt. Pt reports he has slept well and has a good appetite. Pt is oriented x 4. He appears a bit irritable and doesn't provide Probation officer with any new info. Pt says, "It is the same as I told the doctor." He also replies, "It hasn't changed." Pt tells Probation officer he thinks Probation officer is trying to get info in order to discharge him, and he says he wants inpatient treatment. Writer explains that TTS is continuing to seek inpatient placement and gives pt update on hospitals reviewing his referral info. Pt endorses [facility] which he says he has experienced for years.   Arnold Long, [place] Therapeutic Triage Specialist

## 2015-06-01 NOTE — ED Notes (Signed)
Pt ambulatory to desk asking for graham crackers and water at snack time. Advised will bring at 1500.

## 2015-06-01 NOTE — ED Notes (Signed)
Pt ambulatory to nurses' desk asking why he has to have TTS performed. Pt appears upset - states "I still feel the same. I want to go somewhere that can help me." Advised pt [facility] Counselors assist w/finding him placement and they perform assessments w/him routinely. Voiced understanding and returned to room.

## 2015-06-02 DIAGNOSIS — R45850 Homicidal ideations: Secondary | ICD-10-CM | POA: Diagnosis not present

## 2015-06-02 DIAGNOSIS — R45851 Suicidal ideations: Secondary | ICD-10-CM | POA: Diagnosis not present

## 2015-06-02 DIAGNOSIS — F1494 Cocaine use, unspecified with cocaine-induced mood disorder: Secondary | ICD-10-CM

## 2015-06-02 NOTE — ED Notes (Signed)
Patient was given a snack and drink and regular diet order taken for lunch.

## 2015-06-02 NOTE — ED Notes (Signed)
Pt becoming increasingly agitated, pacing floors and aggressively questioning staff.  Notified that he cannot pace the floors - that he must stay in room.  Offered ativan.

## 2015-06-02 NOTE — Consult Note (Signed)
[facility] Face-to-Face Psychiatry Consult   Reason for Consult:  Psychosis, suicide or homicide ideations Referring Physician:  EDP Patient Identification: Justin Chaney MRN:  [id number] Principal Diagnosis: Psychoactive substance-induced organic mood disorder ([place]) Diagnosis:   Patient Active Problem List   Diagnosis Date Noted  . Suicide ideation [R45.851] 01/17/2015  . Non compliance w medication regimen [Z91.14] 01/17/2015  . Bipolar affective disorder, mixed, severe, with psychotic behavior ([place]) [F31.64] 10/17/2014  . Substance induced mood disorder ([place]) [F19.94] 02/08/2014  . Suicidal ideations [R45.851] 12/31/2013  . Homicidal ideations [R45.850] 12/31/2013  . 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 Cannabis abuse [F12.10] 08/15/2012  . Psychoactive substance-induced organic mood disorder ([facility]) [U20.25, F06.30] 08/15/2012    Total Time spent with patient: 1 hour  Subjective:   Justin Chaney is a 19 y.o. male patient admitted with auditory hallucinations, suicidal and homicidal thoughts and increased agitation.  HPI:  Justin Chaney is an 18 y.o. male seen, chart reviewed and case discussed with psychiatric social service for face-to-face psychiatric consultation and evaluation of increased symptoms of auditory/visual hallucinations, delusions, suicidal and homicidal ideation. Patient reportedly discharged from psychiatric facility in [place] about 2 and half weeks ago. Patient reportedly staying with several friends here and there, noncompliant with medication reportedly not helping him. Patient also endorses experimenting with cocaine before coming to the emergency department and at the same time denies regular abuse versus dependence with cocaine. Patient has a history of amphetamine abuse in the past and also required referral to [facility] rehabilitation center from [facility]. Patient has a history of in and out of incarceration for breaking and  entering charges and also was an probation. Currently he does not have any legal charges or probation. Patient has no intention or plans to hurt himself or other people but seeking for inpatient treatment for controlling his symptoms of psychotic hallucinations, delusions, suicidal homicidal thoughts. Patient reportedly failed to attend appointment for his disability due to being incarceration at that time. Patient has multiple acute psychiatric hospitalization at [facility], in [place], [facility], old [facility]. Patient has no outpatient psychiatric medication management provider or counseling services at this Time. He has no family support and lives with friends.  Past Psychiatric History: Patient has history of acute psych admission both at [facility] and [facility].   Risk to Self: Suicidal Ideation: Yes-Currently Present Suicidal Intent: No-Not Currently/Within Last 6 Months Is patient at risk for suicide?: Yes Suicidal Plan?: No-Not Currently/Within Last 6 Months Specify Current Suicidal Plan: pt denies plan currently Access to Means: No Specify Access to Suicidal Means: na-pt denies What has been your use of drugs/alcohol within the last 12 months?: pt reported he used one line of cocaine last night How many times?:  (unk) Other Self Harm Risks: pt denies Triggers for Past Attempts: Unknown Intentional Self Injurious Behavior: None Risk to Others: Homicidal Ideation: No Thoughts of Harm to Others: Yes-Currently Present Comment - Thoughts of Harm to Others: pt stated he was going ot hurt someone ("it could be anybody" Current Homicidal Intent: No Current Homicidal Plan: No Access to Homicidal Means: No Identified Victim: na-pt denies History of harm to others?: No Assessment of Violence: None Noted Violent Behavior Description: na-pt denies Does patient have access to weapons?: No Criminal Charges  Pending?: No Does patient have a court date: No Prior Inpatient Therapy: Prior Inpatient Therapy: Yes Prior Therapy Dates: 2016, 2015 Prior Therapy Facilty/Provider(s):  [facility], [facility] Reason for Treatment: Schizoaffective Disorder, Bipolar Type Prior Outpatient Therapy: Prior Outpatient Therapy: Yes Prior Therapy Dates: 2015 Prior Therapy Facilty/Provider(s): [facility]  Reason for Treatment: Schizoaffective disorder Does patient have an ACCT team?: No Does patient have Intensive In-House Services?  : No Does patient have [facility] services? : No Does patient have P4CC services?: No  Past Medical History:  Past Medical History  Diagnosis Date  . Asthma   . Seizures ([place])   . Brain ventricular shunt obstruction     hydrochelpis  . Depression   . Bipolar disorder [facility])     Past Surgical History  Procedure Laterality Date  . Tonsillectomy    . Ventriculo-peritoneal shunt placement / laparoscopic insertion peritoneal catheter     Family History: History reviewed. No pertinent family history. Family Psychiatric  History:  Unknown Social History:  History  Alcohol Use  . 7.2 oz/week  . 12 Cans of beer per week    Comment: 4-8 40 oz.on the weekend     History  Drug Use  . Yes  . Special: Marijuana, Other-see comments, Amphetamines    Comment: Adderall, "I don't do weed, only rarely"    Social History   Social History  . Marital Status: Single    Spouse Name: N/A  . Number of Children: N/A  . Years of Education: N/A   Social History Main Topics  . Smoking status: Current Every Day Smoker -- 1.00 packs/day    Types: Cigarettes  . Smokeless tobacco: None  . Alcohol Use: 7.2 oz/week    12 Cans of beer per week     Comment: 4-8 40 oz.on the weekend  . Drug Use: Yes    Special: Marijuana, Other-see comments, Amphetamines     Comment: Adderall, "I don't do weed, only rarely"  . Sexual Activity: Not Asked   Other Topics Concern  . None   Social History Narrative    Additional Social History:    Pain Medications: none Prescriptions: see med list Over the Counter: none History of alcohol / drug use?: Yes Longest period of sobriety (when/how long): unk Negative Consequences of Use:  (na) Withdrawal Symptoms:  (na) Name of Substance 1: Cocaine 1 - Age of First Use: unk 1 - Amount (size/oz): unk 1 - Frequency: unk 1 - Duration: unk 1 - Last Use / Amount: last night - 1 line cocaine                   Allergies:   Allergies  Allergen Reactions  . Amoxicillin Hives  . Penicillins Hives and Rash    Labs: No results found for this or any previous visit (from the past 48 hour(s)).  Current Facility-Administered Medications  Medication Dose Route Frequency Provider Last Rate Last Dose  . acetaminophen (TYLENOL) tablet 650 mg  650 mg Oral Q4H PRN Jola Schmidt, MD      . alum & mag hydroxide-simeth (MAALOX/MYLANTA) 200-200-20 MG/5ML suspension 30 mL  30 mL Oral PRN Jola Schmidt, MD      . ibuprofen (ADVIL,MOTRIN) tablet 600 mg  600 mg Oral Q8H PRN Jola Schmidt, MD      . LORazepam (ATIVAN) tablet 1 mg  1 mg Oral Q8H PRN Jola Schmidt, MD      . zolpidem [facility]) tablet 5 mg  5 mg Oral QHS PRN Jola Schmidt, MD       Current Outpatient Prescriptions  Medication Sig Dispense Refill  . ARIPiprazole (ABILIFY) 20 MG tablet Take  20 mg by mouth daily.      Musculoskeletal: Strength & Muscle Tone: within normal limits Gait & Station: normal Patient leans: N/A  Psychiatric Specialty Exam: ROS  No Fever-chills, No Headache, No changes with Vision or hearing, reports vertigo No problems swallowing food or Liquids, No Chest pain, Cough or Shortness of Breath, No Abdominal pain, No Nausea or Vommitting, Bowel movements are regular, No Blood in stool or Urine, No dysuria, No new skin rashes or bruises, No new joints pains-aches,  No new weakness, tingling, numbness in any extremity, No recent weight gain or loss, No polyuria, polydypsia  or polyphagia,   A full 10 point Review of Systems was done, except as stated above, all other Review of Systems were negative.  Blood pressure 119/63, pulse 80, temperature 98 F (36.7 C), temperature source Oral, resp. rate 16, SpO2 98 %.There is no weight on file to calculate BMI.  General Appearance: Bizarre and Guarded  Eye Contact::  Good  Speech:  Clear and Coherent and Pressured  Volume:  Normal  Mood:  Anxious and Depressed  Affect:  Inappropriate and Labile  Thought Process:  Disorganized  Orientation:  Full (Time, Place, and Person)  Thought Content:  Delusions, Hallucinations: Auditory Visual and Paranoid Ideation  Suicidal Thoughts:  Yes.  without intent/plan  Homicidal Thoughts:  Yes.  without intent/plan  Memory:  Immediate;   Good Recent;   Good  Judgement:  Intact  Insight:  Fair  Psychomotor Activity:  Restlessness  Concentration:  Good  Recall:  Good  Fund of Knowledge:Good  Language: Good  Akathisia:  Negative  Handed:  Right  AIMS (if indicated):     Assets:  Communication Skills Desire for Improvement Leisure Time Physical Health Resilience Social Support Transportation  ADL's:  Intact  Cognition: WNL  Sleep:      Treatment Plan Summary: Daily contact with patient to assess and evaluate symptoms and progress in treatment and Medication management  Disposition: Refer to the psychiatric social service/TTS regarding acute inpatient psychiatric hospitalization for crisis stabilization, safety monitoring on medication management of psychosis. Recommend psychiatric Inpatient admission when medically cleared. Supportive therapy provided about ongoing stressors.  Melvenia Favela,JANARDHAHA R. 06/02/2015 9:41 AM

## 2015-06-02 NOTE — ED Notes (Signed)
Pt appears hostile.  States he continues to have HI/SI, but will not give details.  States he is tired of being here and wants to be in a different facility. States he is not on meds for his bipolar and does not want to be - that's not why he's here per pt.

## 2015-06-02 NOTE — ED Notes (Signed)
Social work and police are aware that pt name is misspelled on paperwork.

## 2015-06-02 NOTE — Progress Notes (Signed)
Carla at [facility] called to note that during report she was told pt had become aggressive in ED. CSW relayed event noted in chart (see previous RN note) and that pt had been administered Ativan (PRN) 12:28-only PRN med noted since stay in ED. Offered to have her speak with ED staff for more details. Angela Nevin stated information was sufficient for now and that she will speak to her attending MD and NP.   Sharren Bridge, MSW, LCSW Clinical Social Work, Disposition  06/02/2015 [phone]

## 2015-06-02 NOTE — ED Notes (Signed)
Pt is angry that [employer] has not come yet to take him to him.  Becoming loud and aggressive.  [employer] called to pt room.  Pt became calm.

## 2015-06-02 NOTE — Progress Notes (Signed)
CSW referred pt to [facility] (per Carla-intake, referral had not been received over the weekend but beds will open today).  Dr. Jake Samples accepted patient for admission and per [facility], behavioral unit bed will be available at 2pm this afternoon. Requests pt be under IVC prior to transportation in order to ensure safety of pt. Number for Rn report is [phone]. Can be called at 1:30pm or after as pt's bed assignment will be completed then per [facility].   Spoke with [facility] SW and RN re: pt's acceptance to [facility].  Sharren Bridge, MSW, LCSW Clinical Social Work, Disposition  06/02/2015 [phone]

## 2015-06-02 NOTE — ED Notes (Signed)
Pt offered opportunity to bathe.  Stated he did so last night and he was not ready.

## 2015-06-02 NOTE — ED Notes (Signed)
PT ready for shower.  Given morning snack.

## 2015-06-03 DIAGNOSIS — F602 Antisocial personality disorder: Secondary | ICD-10-CM | POA: Insufficient documentation

## 2015-06-03 DIAGNOSIS — F1494 Cocaine use, unspecified with cocaine-induced mood disorder: Secondary | ICD-10-CM | POA: Insufficient documentation

## 2015-06-03 DIAGNOSIS — F1410 Cocaine abuse, uncomplicated: Secondary | ICD-10-CM | POA: Insufficient documentation

## 2015-06-05 DIAGNOSIS — Z765 Malingerer [conscious simulation]: Secondary | ICD-10-CM | POA: Insufficient documentation

## 2015-07-31 ENCOUNTER — Emergency Department (HOSPITAL_COMMUNITY)
Admission: EM | Admit: 2015-07-31 | Discharge: 2015-08-01 | Disposition: A | Discharge status: Other Acute Inpatient Hospital | Payer: BLUE CROSS/BLUE SHIELD | Attending: Emergency Medicine | Admitting: Emergency Medicine

## 2015-07-31 ENCOUNTER — Encounter (HOSPITAL_COMMUNITY): Payer: Self-pay | Admitting: Emergency Medicine

## 2015-07-31 DIAGNOSIS — J45909 Unspecified asthma, uncomplicated: Secondary | ICD-10-CM | POA: Insufficient documentation

## 2015-07-31 DIAGNOSIS — Z880 Allergy status to penicillin: Secondary | ICD-10-CM | POA: Diagnosis not present

## 2015-07-31 DIAGNOSIS — Z87898 Personal history of other specified conditions: Secondary | ICD-10-CM | POA: Diagnosis not present

## 2015-07-31 DIAGNOSIS — R45851 Suicidal ideations: Secondary | ICD-10-CM

## 2015-07-31 DIAGNOSIS — F3164 Bipolar disorder, current episode mixed, severe, with psychotic features: Secondary | ICD-10-CM | POA: Diagnosis present

## 2015-07-31 DIAGNOSIS — F17210 Nicotine dependence, cigarettes, uncomplicated: Secondary | ICD-10-CM | POA: Diagnosis not present

## 2015-07-31 DIAGNOSIS — R443 Hallucinations, unspecified: Secondary | ICD-10-CM

## 2015-07-31 DIAGNOSIS — R440 Auditory hallucinations: Secondary | ICD-10-CM | POA: Insufficient documentation

## 2015-07-31 DIAGNOSIS — R45850 Homicidal ideations: Secondary | ICD-10-CM | POA: Diagnosis not present

## 2015-07-31 LAB — RAPID URINE DRUG SCREEN, HOSP PERFORMED
Amphetamines: NOT DETECTED
Barbiturates: NOT DETECTED
Benzodiazepines: NOT DETECTED
Cocaine: NOT DETECTED
Opiates: NOT DETECTED
Tetrahydrocannabinol: NOT DETECTED

## 2015-07-31 LAB — COMPREHENSIVE METABOLIC PANEL
ALT: 57 U/L (ref 17–63)
AST: 30 U/L (ref 15–41)
Albumin: 4.7 g/dL (ref 3.5–5.0)
Alkaline Phosphatase: 86 U/L (ref 38–126)
Anion gap: 7 (ref 5–15)
BUN: 10 mg/dL (ref 6–20)
CO2: 29 mmol/L (ref 22–32)
Calcium: 9.6 mg/dL (ref 8.9–10.3)
Chloride: 105 mmol/L (ref 101–111)
Creatinine, Ser: 0.9 mg/dL (ref 0.61–1.24)
GFR calc Af Amer: >60 mL/min (ref >60)
GFR calc non Af Amer: >60 mL/min (ref >60)
Glucose, Bld: 95 mg/dL (ref 65–99)
Potassium: 3.8 mmol/L (ref 3.5–5.1)
Sodium: 141 mmol/L (ref 135–145)
Total Bilirubin: 0.7 mg/dL (ref 0.3–1.2)
Total Protein: 7.6 g/dL (ref 6.5–8.1)

## 2015-07-31 LAB — CBC WITH DIFFERENTIAL/PLATELET
Basophils Absolute: 0 10*3/uL (ref 0.0–0.1)
Basophils Relative: 1 %
Eosinophils Absolute: 0.1 10*3/uL (ref 0.0–0.7)
Eosinophils Relative: 1 %
HCT: 46.6 % (ref 39.0–52.0)
Hemoglobin: 15.9 g/dL (ref 13.0–17.0)
Lymphocytes Relative: 32 %
Lymphs Abs: 2.2 10*3/uL (ref 0.7–4.0)
MCH: 28.3 pg (ref 26.0–34.0)
MCHC: 34.1 g/dL (ref 30.0–36.0)
MCV: 83.1 fL (ref 78.0–100.0)
Monocytes Absolute: 0.8 10*3/uL (ref 0.1–1.0)
Monocytes Relative: 11 %
Neutro Abs: 3.8 10*3/uL (ref 1.7–7.7)
Neutrophils Relative %: 55 %
Platelets: 205 10*3/uL (ref 150–400)
RBC: 5.61 MIL/uL (ref 4.22–5.81)
RDW: 12 % (ref 11.5–15.5)
WBC: 6.8 10*3/uL (ref 4.0–10.5)

## 2015-07-31 LAB — ETHANOL: Alcohol, Ethyl (B): <5 mg/dL (ref <5)

## 2015-07-31 LAB — SALICYLATE LEVEL: Salicylate Lvl: <4 mg/dL (ref 2.8–30.0)

## 2015-07-31 LAB — ACETAMINOPHEN LEVEL: Acetaminophen (Tylenol), Serum: <10 ug/mL — ABNORMAL LOW (ref 10–30)

## 2015-07-31 MED ORDER — ACETAMINOPHEN 325 MG PO TABS: 650.0000 mg | ORAL_TABLET | ORAL | Status: DC | PRN

## 2015-07-31 MED ORDER — ONDANSETRON HCL 4 MG PO TABS: 4.0000 mg | ORAL_TABLET | Freq: Three times a day (TID) | ORAL | Status: DC | PRN

## 2015-07-31 MED ORDER — ALUM & MAG HYDROXIDE-SIMETH 200-200-20 MG/5ML PO SUSP: 30.0000 mL | ORAL | Status: DC | PRN

## 2015-07-31 MED ORDER — LORAZEPAM 1 MG PO TABS: 1.0000 mg | ORAL_TABLET | Freq: Three times a day (TID) | ORAL | Status: DC | PRN

## 2015-07-31 MED ORDER — ZOLPIDEM TARTRATE 5 MG PO TABS: 5.0000 mg | ORAL_TABLET | Freq: Every evening | ORAL | Status: DC | PRN

## 2015-07-31 MED ORDER — NICOTINE 21 MG/24HR TD PT24: 21.0000 mg | MEDICATED_PATCH | Freq: Every day | TRANSDERMAL | Status: DC

## 2015-07-31 MED ORDER — IBUPROFEN 200 MG PO TABS: 600.0000 mg | ORAL_TABLET | Freq: Three times a day (TID) | ORAL | Status: DC | PRN

## 2015-07-31 NOTE — ED Notes (Signed)
Patient arrived to the unit ambulatory.  States he stopped his Abilify "cause that shit don't work."  States he has been living with his mom and had a job at 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  States he has had an increase in auditory hallucinations and depression.  States "I've gotten crazier and Pharmacist, community."  He has been cooperative with staff since coming back to [facility].  He was given a sandwich and a soda.  He is currently resting in his room.

## 2015-07-31 NOTE — ED Provider Notes (Signed)
CSN: [id number]     Arrival date & time 07/31/15  1510 History   First MD Initiated Contact with Patient 07/31/15 1556     Chief Complaint  Patient presents with  . Suicidal     (Consider location/radiation/quality/duration/timing/severity/associated sxs/prior Treatment) HPI Comments: Justin Chaney is a 19 y.o. male with a PMHx of asthma, seizures, depression, and bipolar disorder, who presents to the ED with complaints of SI and auditory hallucinations. Patient very angry and not wanting to answer many questions, but he states that he is "an angel that was sent from heaven" and he has thoughts of harming himself and others but has no specific plans for either. When asked about hallucinations, he admits to auditory hallucinations but will not elaborate on whether he has any visual hallucinations. +Smoker, denies illicit drugs or EtOH use. He has not been taking his abilify in "a while" because he states "it doesn't work". He denies any other complaints at this time. +Prior suicide attempts. +Prior admissions for psych issues  Patient is a 19 y.o. male presenting with mental health disorder. The history is provided by the patient and medical records. No language interpreter was used.  Mental Health Problem Presenting symptoms: depression, hallucinations, homicidal ideas and suicidal thoughts   Onset quality:  Unable to specify Timing:  Constant Progression:  Unchanged Chronicity:  Chronic Context: noncompliance   Treatment compliance:  Untreated Relieved by:  None tried Worsened by:  Nothing tried Ineffective treatments:  None tried Associated symptoms: no abdominal pain and no chest pain   Risk factors: hx of mental illness, hx of suicide attempts and recent psychiatric admission     Past Medical History  Diagnosis Date  . Asthma   . Seizures ([place])   . Brain ventricular shunt obstruction     hydrochelpis  . Depression   . Bipolar disorder [facility])    Past Surgical History   Procedure Laterality Date  . Tonsillectomy    . Ventriculo-peritoneal shunt placement / laparoscopic insertion peritoneal catheter     No family history on file. Social History  Substance Use Topics  . Smoking status: Current Every Day Smoker -- 1.00 packs/day    Types: Cigarettes  . Smokeless tobacco: None  . Alcohol Use: 7.2 oz/week    12 Cans of beer per week     Comment: 4-8 40 oz.on the weekend    Review of Systems  Constitutional: Negative for fever and chills.  Respiratory: Negative for shortness of breath.   Cardiovascular: Negative for chest pain.  Gastrointestinal: Negative for nausea, vomiting, abdominal pain, diarrhea and constipation.  Genitourinary: Negative for dysuria and hematuria.  Musculoskeletal: Negative for myalgias and arthralgias.  Skin: Negative for color change.  Allergic/Immunologic: Negative for immunocompromised state.  Neurological: Negative for weakness and numbness.  Psychiatric/Behavioral: Positive for suicidal ideas, homicidal ideas and hallucinations. Negative for confusion.   10 Systems reviewed and are negative for acute change except as noted in the HPI.    Allergies  Amoxicillin and Penicillins  Home Medications   Prior to Admission medications   Medication Sig Start Date End Date Taking? Authorizing Provider  ARIPiprazole (ABILIFY) 20 MG tablet Take 20 mg by mouth daily.    Historical Provider, MD   BP 124/82 mmHg  Pulse 89  Temp(Src) 98.2 F (36.8 C) (Oral)  Resp 20  SpO2 100% Physical Exam  Constitutional: He is oriented to person, place, and time. Vital signs are normal. He appears well-developed and well-nourished.  Non-toxic appearance. No distress.  Afebrile, nontoxic, NAD  HENT:  Head: Normocephalic and atraumatic.  Mouth/Throat: Oropharynx is clear and moist and mucous membranes are normal.  Eyes: Conjunctivae and EOM are normal. Right eye exhibits no discharge. Left eye exhibits no discharge.  Neck: Normal range of  motion. Neck supple.  Cardiovascular: Normal rate, regular rhythm, normal heart sounds and intact distal pulses.  Exam reveals no gallop and no friction rub.   No murmur heard. Pulmonary/Chest: Effort normal and breath sounds normal. No respiratory distress. He has no decreased breath sounds. He has no wheezes. He has no rhonchi. He has no rales.  Abdominal: Soft. Normal appearance and bowel sounds are normal. He exhibits no distension. There is no tenderness. There is no rigidity, no rebound and no guarding.  Musculoskeletal: Normal range of motion.  Neurological: He is alert and oriented to person, place, and time. He has normal strength. No sensory deficit.  Skin: Skin is warm, dry and intact. No rash noted.  Psychiatric: His affect is angry. He is actively hallucinating (reports auditory hallucinations). Thought content is delusional. He expresses homicidal and suicidal ideation. He expresses no suicidal plans and no homicidal plans.  Angry and not wanting to answer most questions. Fairly uncooperative. +Delusions, states he's an "angel that was sent down". Endorsing SI and HI without a specific plan for either  Nursing note and vitals reviewed.   ED Course  Procedures (including critical care time) Labs Review Labs Reviewed  ACETAMINOPHEN LEVEL - Abnormal; Notable for the following:    Acetaminophen (Tylenol), Serum <10 (*)    All other components within normal limits  COMPREHENSIVE METABOLIC PANEL  ETHANOL  CBC WITH DIFFERENTIAL/PLATELET  URINE RAPID DRUG SCREEN, HOSP PERFORMED  SALICYLATE LEVEL    Imaging Review No results found. I have personally reviewed and evaluated these images and lab results as part of my medical decision-making.   EKG Interpretation None      MDM   Final diagnoses:  Homicidal ideations  Suicidal ideations  Hallucinations    19 y.o. male here with SI and Auditory hallucinations, pt very angry and not wanting to answer many questions. No other  complaints. Will get clearance labs, TTS consult, and order psych hold meds. Pt refusing to take abilify, will hold off on ordering this. Will reassess after labs.  5:19 PM UDS neg. Labs unremarkable. Pt medically cleared. Please see [facility] notes for further documentation of care.  BP 124/82 mmHg  Pulse 89  Temp(Src) 98.2 F (36.8 C) (Oral)  Resp 20  SpO2 100%  Meds ordered this encounter  Medications  . alum & mag hydroxide-simeth (MAALOX/MYLANTA) 200-200-20 MG/5ML suspension 30 mL    Sig:   . ondansetron (ZOFRAN) tablet 4 mg    Sig:   . nicotine (NICODERM CQ - dosed in mg/24 hours) patch 21 mg    Sig:   . zolpidem (AMBIEN) tablet 5 mg    Sig:   . ibuprofen (ADVIL,MOTRIN) tablet 600 mg    Sig:   . acetaminophen (TYLENOL) tablet 650 mg    Sig:   . LORazepam (ATIVAN) tablet 1 mg    Sig:       Zacarias Pontes, PA-C 07/31/15 1726  Forde Dandy, MD 08/01/15 [phone]

## 2015-07-31 NOTE — ED Notes (Addendum)
Patient presents for SI, AVH. Denies other c/c. States "I'm an angel that fell from [place]. I don't like humans". Patient is verbally aggressive. When asked if having HI, patient states "I don't want to talk about that." When asked about AVH, patient states, "I don't want to talk about that."

## 2015-07-31 NOTE — Progress Notes (Signed)
Patient meets inpatient criteria, per NPJosephine.  Patient has been referred out to: [facility] - per Scarlett Presto and child adolescent beds open, fax referral. [place] - per Estill Bamberg, fax it for review. [facility] - per Junita Push, male beds, fax referral. [place] - left voicemail, patient IVC'd therefore has been referred here. [facility] - per intake, fax referral for the waitlist. [facility] - per Danise Mina, adult and adolescent beds, fax referral.  At capacity: [facility],  [facility] - per Maudie Mercury, call in am. [place], [place] Disposition staff 07/31/2015 10:13 PM

## 2015-07-31 NOTE — ED Notes (Signed)
Patient and belongings have been wanded by security. One personal bag and two patient belongings bags.
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
## 2015-08-01 NOTE — BH Assessment (Signed)
[place] Assessment Progress Note  This Probation officer spoke to pt about transfer to [facility].  He is agreeable.  Waylan Boga, NP, concurs with this decision.  Pt is voluntary and is to be transported via [facility].  Pt's nurse, Jan, has been notified.  Jalene Mullet, [place] Triage Specialist [phone]

## 2015-08-01 NOTE — ED Notes (Signed)
D:Pt is in his room with hopelessness "no reason to live." Pt reports that he is the second fallen angel that fell from hell. He states that he has hi thoughts to anyone and auditory and visual hallucinations. Pt referred to Probation officer as having snake eyes. A:Offered support and 15 minute checks. Discussed upcoming inpatient hospitalization with pt. R:Safety maintained on the unit.

## 2015-08-01 NOTE — ED Notes (Signed)
Pt d/c with [facility] to [facility]. All items returned.

## 2015-08-01 NOTE — BH Assessment (Signed)
Pt has been accepted to [facility] to the services of Dr. Selinda Flavin. Nurse to nurse report can be called to [phone]. Pt can bee transported after 9 am. Informed Dr. Christy Gentles and pt's nurse of disposition.

## 2015-08-23 ENCOUNTER — Emergency Department (HOSPITAL_COMMUNITY)
Admission: EM | Admit: 2015-08-23 | Discharge: 2015-08-25 | Disposition: A | Discharge status: Other Acute Inpatient Hospital | Payer: BLUE CROSS/BLUE SHIELD | Attending: Physician Assistant | Admitting: Physician Assistant

## 2015-08-23 ENCOUNTER — Encounter (HOSPITAL_COMMUNITY): Payer: Self-pay | Admitting: Oncology

## 2015-08-23 DIAGNOSIS — R441 Visual hallucinations: Secondary | ICD-10-CM | POA: Insufficient documentation

## 2015-08-23 DIAGNOSIS — Z880 Allergy status to penicillin: Secondary | ICD-10-CM | POA: Diagnosis not present

## 2015-08-23 DIAGNOSIS — F3164 Bipolar disorder, current episode mixed, severe, with psychotic features: Secondary | ICD-10-CM

## 2015-08-23 DIAGNOSIS — J45909 Unspecified asthma, uncomplicated: Secondary | ICD-10-CM | POA: Diagnosis not present

## 2015-08-23 DIAGNOSIS — F17210 Nicotine dependence, cigarettes, uncomplicated: Secondary | ICD-10-CM | POA: Diagnosis not present

## 2015-08-23 DIAGNOSIS — R45850 Homicidal ideations: Secondary | ICD-10-CM | POA: Diagnosis not present

## 2015-08-23 DIAGNOSIS — R45851 Suicidal ideations: Secondary | ICD-10-CM | POA: Diagnosis not present

## 2015-08-23 LAB — COMPREHENSIVE METABOLIC PANEL
ALT: 33 U/L (ref 17–63)
AST: 33 U/L (ref 15–41)
Albumin: 4.5 g/dL (ref 3.5–5.0)
Alkaline Phosphatase: 73 U/L (ref 38–126)
Anion gap: 10 (ref 5–15)
BUN: 13 mg/dL (ref 6–20)
CO2: 24 mmol/L (ref 22–32)
Calcium: 9.4 mg/dL (ref 8.9–10.3)
Chloride: 104 mmol/L (ref 101–111)
Creatinine, Ser: 0.79 mg/dL (ref 0.61–1.24)
GFR calc Af Amer: >60 mL/min (ref >60)
GFR calc non Af Amer: >60 mL/min (ref >60)
Glucose, Bld: 94 mg/dL (ref 65–99)
Potassium: 3.4 mmol/L — ABNORMAL LOW (ref 3.5–5.1)
Sodium: 138 mmol/L (ref 135–145)
Total Bilirubin: 0.6 mg/dL (ref 0.3–1.2)
Total Protein: 7.5 g/dL (ref 6.5–8.1)

## 2015-08-23 LAB — CBC
HCT: 45.3 % (ref 39.0–52.0)
Hemoglobin: 15.7 g/dL (ref 13.0–17.0)
MCH: 28.8 pg (ref 26.0–34.0)
MCHC: 34.7 g/dL (ref 30.0–36.0)
MCV: 83.1 fL (ref 78.0–100.0)
Platelets: 201 10*3/uL (ref 150–400)
RBC: 5.45 MIL/uL (ref 4.22–5.81)
RDW: 12.4 % (ref 11.5–15.5)
WBC: 8.2 10*3/uL (ref 4.0–10.5)

## 2015-08-23 LAB — ETHANOL: Alcohol, Ethyl (B): <5 mg/dL (ref <5)

## 2015-08-23 LAB — SALICYLATE LEVEL: Salicylate Lvl: <4 mg/dL (ref 2.8–30.0)

## 2015-08-23 LAB — ACETAMINOPHEN LEVEL: Acetaminophen (Tylenol), Serum: <10 ug/mL — ABNORMAL LOW (ref 10–30)

## 2015-08-23 MED ORDER — ZOLPIDEM TARTRATE 5 MG PO TABS: 5.0000 mg | ORAL_TABLET | Freq: Every evening | ORAL | Status: DC | PRN

## 2015-08-23 MED ORDER — LORAZEPAM 1 MG PO TABS: 1.0000 mg | ORAL_TABLET | Freq: Three times a day (TID) | ORAL | Status: DC | PRN

## 2015-08-23 MED ORDER — ONDANSETRON HCL 4 MG PO TABS: 4.0000 mg | ORAL_TABLET | Freq: Three times a day (TID) | ORAL | Status: DC | PRN

## 2015-08-23 MED ORDER — ACETAMINOPHEN 325 MG PO TABS: 650.0000 mg | ORAL_TABLET | ORAL | Status: DC | PRN

## 2015-08-23 MED ORDER — IBUPROFEN 200 MG PO TABS: 600.0000 mg | ORAL_TABLET | Freq: Three times a day (TID) | ORAL | Status: DC | PRN

## 2015-08-23 MED ORDER — ALUM & MAG HYDROXIDE-SIMETH 200-200-20 MG/5ML PO SUSP: 30.0000 mL | ORAL | Status: DC | PRN

## 2015-08-23 MED ORDER — NICOTINE 21 MG/24HR TD PT24
21.0000 mg | MEDICATED_PATCH | Freq: Every day | TRANSDERMAL | Status: DC
Start: 2015-08-24 — End: 2015-08-25
  Filled 2015-08-23: qty 1

## 2015-08-23 NOTE — ED Notes (Signed)
Pt brought in voluntarily by [employer] d/t SI/HI, also pt thinks he is the anti-christ and will rule the world for a thousand years.  Per [employer] pt has been calm while in their presence.
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
## 2015-08-23 NOTE — ED Notes (Signed)
Pt. To [facility] from ED ambulatory without difficulty, to room 37 . Report from [employer]. Pt. Is alert and oriented, warm and dry in no acute distress. Pt. Refuses to answer questions about SI, HI, and AVH. Pt. Calm. Pt. Made aware of security cameras and Q15 minute rounds. Pt. Encouraged to let Nursing staff know of any concerns or needs.

## 2015-08-23 NOTE — ED Notes (Signed)
Pt. Noted in room. No complaints or concerns voiced. No distress or abnormal behavior noted. Will continue to monitor with security cameras. Q 15 minute rounds continue. 

## 2015-08-23 NOTE — ED Provider Notes (Signed)
CSN: [id number]     Arrival date & time 08/23/15  2026 History   First MD Initiated Contact with Patient 08/23/15 2145     Chief Complaint  Patient presents with  . Delusional  . Suicidal  . Homicidal     (Consider location/radiation/quality/duration/timing/severity/associated sxs/prior Treatment) The history is provided by the patient and medical records.    20 year old male with history of asthma, seizure disorder, ventricular shunt for hydrocephalus, depression, bipolar disorder, presenting to the ED with [employer] for evaluation. Patient was apparently at a gas station downtown and called [employer] stating he needed to come to the hospital for psychiatric treatment. Patient states " I am the anti-christ, the original one, and satan is my brother".  He states he has been having suicidal ideation his entire life. He does not disclose a specific plan. He states he also has homicidal ideation, but not towards anyone in particular. He states "it could be anyone just walking down the street and I could kill them".  Patient states he also sees people that are not there and hears voices. He states the voices do not necessary tell him anything in particular. He denies any drug or alcohol abuse. Patient is not currently established with a psychiatrist. He does not take any home medications because "I don't believe in medications".  Past Medical History  Diagnosis Date  . Asthma   . Seizures ([place])   . Brain ventricular shunt obstruction     hydrochelpis  . Depression   . Bipolar disorder [facility])    Past Surgical History  Procedure Laterality Date  . Tonsillectomy    . Ventriculo-peritoneal shunt placement / laparoscopic insertion peritoneal catheter     History reviewed. No pertinent family history. Social History  Substance Use Topics  . Smoking status: Current Every Day Smoker -- 1.00 packs/day    Types: Cigarettes  . Smokeless tobacco: None  . Alcohol Use: 7.2 oz/week    12 Cans of beer per week      Comment: 4-8 40 oz.on the weekend    Review of Systems  Psychiatric/Behavioral: Positive for suicidal ideas and hallucinations.  All other systems reviewed and are negative.     Allergies  Amoxicillin and Penicillins  Home Medications   Prior to Admission medications   Not on File   BP 131/65 mmHg  Pulse 95  Temp(Src) 98.2 F (36.8 C) (Oral)  Resp 18  SpO2 98%   Physical Exam  Constitutional: He is oriented to person, place, and time. He appears well-developed and well-nourished.  HENT:  Head: Normocephalic and atraumatic.  Mouth/Throat: Oropharynx is clear and moist.  Eyes: Conjunctivae and EOM are normal. Pupils are equal, round, and reactive to light.  Neck: Normal range of motion.  Cardiovascular: Normal rate, regular rhythm and normal heart sounds.   Pulmonary/Chest: Effort normal and breath sounds normal.  Abdominal: Soft. Bowel sounds are normal.  Musculoskeletal: Normal range of motion.  Neurological: He is alert and oriented to person, place, and time.  Skin: Skin is warm and dry.  Psychiatric: He has a normal mood and affect. He is actively hallucinating. Thought content is delusional. He expresses homicidal and suicidal ideation.  Somewhat uncooperative-- states he has SI/HI but does not disclose plan; endorses auditory and visual hallucinations; delusional-- states he is the anti-christ  Nursing note and vitals reviewed.   ED Course  Procedures (including critical care time) Labs Review Labs Reviewed  COMPREHENSIVE METABOLIC PANEL - Abnormal; Notable for the following:  Potassium 3.4 (*)    All other components within normal limits  ACETAMINOPHEN LEVEL - Abnormal; Notable for the following:    Acetaminophen (Tylenol), Serum <10 (*)    All other components within normal limits  ETHANOL  SALICYLATE LEVEL  CBC  URINE RAPID DRUG SCREEN, HOSP PERFORMED    Imaging Review No results found. I have personally reviewed and evaluated these images  and lab results as part of my medical decision-making.   EKG Interpretation None      MDM   Final diagnoses:  Suicide ideation  Bipolar affective disorder, mixed, severe, with psychotic behavior ([place])   20 year old male here for psychiatric evaluation. Patient has been seen in the ED for similar symptoms in the past. He endorses SI, HI, and AVH but does not disclose further details.  Patient is delusional-- states he is the anti-christ.  Labs reviewed thus far, no significant abnormalities.  UDS pending. Patient medically cleared.  Holding orders placed, TTS evaluation pending.  TTS has evaluated patient.  Meets inpatient criteria, will seek placement.  Larene Pickett, PA-C 08/23/15 [place], MD 08/24/15 0001

## 2015-08-24 DIAGNOSIS — R45851 Suicidal ideations: Secondary | ICD-10-CM

## 2015-08-24 DIAGNOSIS — F3164 Bipolar disorder, current episode mixed, severe, with psychotic features: Secondary | ICD-10-CM | POA: Diagnosis not present

## 2015-08-24 LAB — RAPID URINE DRUG SCREEN, HOSP PERFORMED
Amphetamines: NOT DETECTED
Barbiturates: NOT DETECTED
Benzodiazepines: NOT DETECTED
Cocaine: NOT DETECTED
Opiates: NOT DETECTED
Tetrahydrocannabinol: NOT DETECTED

## 2015-08-24 MED ORDER — ARIPIPRAZOLE 5 MG PO TABS
5.0000 mg | ORAL_TABLET | Freq: Two times a day (BID) | ORAL | Status: DC
  Administered 2015-08-24: 5 mg via ORAL
  Filled 2015-08-24 (×4): qty 1

## 2015-08-24 MED ORDER — BENZTROPINE MESYLATE 1 MG PO TABS
1.0000 mg | ORAL_TABLET | Freq: Two times a day (BID) | ORAL | Status: DC
  Administered 2015-08-24: 1 mg via ORAL
  Filled 2015-08-24 (×3): qty 1

## 2015-08-24 MED ORDER — TRAZODONE HCL 100 MG PO TABS: 100.0000 mg | ORAL_TABLET | Freq: Every evening | ORAL | Status: DC | PRN

## 2015-08-24 MED ORDER — OXCARBAZEPINE 300 MG PO TABS
300.0000 mg | ORAL_TABLET | Freq: Two times a day (BID) | ORAL | Status: DC
  Administered 2015-08-24: 300 mg via ORAL
  Filled 2015-08-24 (×3): qty 1

## 2015-08-24 NOTE — ED Notes (Signed)
Pt. Noted sleeping in room. No complaints or concerns voiced. No distress or abnormal behavior noted. Will continue to monitor with security cameras. Q 15 minute rounds continue. 

## 2015-08-24 NOTE — Progress Notes (Signed)
CSW received telephone contact from [facility] Adult admissions stating they were accepting pt for Monday morning after 0830.  CSW contacted [facility] EDstaff to inform them.  [place] Disposition CSW [phone]

## 2015-08-24 NOTE — ED Notes (Signed)
On the phone 

## 2015-08-24 NOTE — Consult Note (Signed)
[facility] Face-to-Face Psychiatry Consult   Reason for Consult:  Disorganized thought/speech, suicidal ideation, homicidal ideation. Referring Physician:  EDP Patient Identification: Justin Chaney MRN:  [id number] Principal Diagnosis: Bipolar affective disorder, mixed, severe, with psychotic behavior ([place]) Diagnosis:   Patient Active Problem List   Diagnosis Date Noted  . Bipolar affective disorder, mixed, severe, with psychotic behavior ([place]) [F31.64] 10/17/2014    Priority: High  . Suicide ideation [R45.851] 01/17/2015  . Non compliance w medication regimen [Z91.14] 01/17/2015  . Substance induced mood disorder ([place]) [F19.94] 02/08/2014  . Suicidal ideations [R45.851] 12/31/2013  . Homicidal ideations [R45.850] 12/31/2013  . 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 Cannabis abuse [F12.10] 08/15/2012  . Psychoactive substance-induced organic mood disorder ([facility]) [J69.67, F06.30] 08/15/2012    Total Time spent with patient: 45 minutes  Subjective:   Justin Chaney is a 20 y.o. male patient admitted with  Disorganized thought/speech, suicidal ideation, homicidal ideation.Marland Kitchen  HPI:  AA male, 20 years old was evaluated for suicidal and homicidal ideation  And disorganized speech.  Patient was discharged from [facility] last week and have not been taking his prescribed medications.  Patient reports that his medications are not effective, he claims that he is anti Christ and does not believe in [place].  Patient also stated that he hears voices telling him to kill himself and others.  He requested to be sent to [facility].  [facility] has agreed to take patient and will take him tomorrow.  Patient stays in his room and comes out for meals.  Past Psychiatric History:  Bipolar affective disorder, Schizoaffective disorder, Amphetamine/stimulant drug abuse.  Risk to Self: Suicidal Ideation: Yes-Currently Present Suicidal Intent: Yes-Currently Present Is patient at risk for suicide?: Yes Suicidal  Plan?: Yes-Currently Present Specify Current Suicidal Plan: Pt did not disclose  Access to Means: No Specify Access to Suicidal Means: Pt did not disclose  What has been your use of drugs/alcohol within the last 12 months?: Pt denies.  How many times?: 5 Other Self Harm Risks: Pt denies  Triggers for Past Attempts: Other (Comment) ("I don't know') Intentional Self Injurious Behavior: None Risk to Others: Homicidal Ideation: Yes-Currently Present Thoughts of Harm to Others: Yes-Currently Present Comment - Thoughts of Harm to Others: "just anybody"  Current Homicidal Intent: Yes-Currently Present Current Homicidal Plan: Yes-Currently Present Describe Current Homicidal Plan: "It will scare you".  Access to Homicidal Means: Yes Identified Victim: "Just anyone"  History of harm to others?: No Assessment of Violence: None Noted Violent Behavior Description: No violent behaviors observed.  Does patient have access to weapons?: No Criminal Charges Pending?: No Does patient have a court date: No Prior Inpatient Therapy: Prior Inpatient Therapy: Yes Prior Therapy Dates:  (patient unable to recall dates) Prior Therapy Facilty/Provider(s):  [facility], [place], [place], and [place]) Reason for Treatment:  (Bipolar, Depression, and Substance Abuse) Prior Outpatient Therapy: Prior Outpatient Therapy: No Prior Therapy Dates:  (n/a) Prior Therapy Facilty/Provider(s):  (n/a) Reason for Treatment:  (n/a) Does patient have an ACCT team?: No Does patient have Intensive In-House Services?  : No Does patient have [facility] services? : No Does patient have P4CC services?: No  Past Medical History:  Past Medical History  Diagnosis Date  . Asthma   . Seizures ([place])   . Brain ventricular shunt obstruction     hydrochelpis  . Depression   . Bipolar disorder [facility])     Past Surgical History  Procedure Laterality Date  . Tonsillectomy    . Ventriculo-peritoneal  shunt placement / laparoscopic insertion  peritoneal catheter     Family History: History reviewed. No pertinent family history.   Family Psychiatric  History:   Denies Social History:  History  Alcohol Use  . 7.2 oz/week  . 12 Cans of beer per week    Comment: 4-8 40 oz.on the weekend     History  Drug Use  . Yes  . Special: Marijuana, Other-see comments, Amphetamines    Comment: Adderall, "I don't do weed, only rarely"    Social History   Social History  . Marital Status: Single    Spouse Name: N/A  . Number of Children: N/A  . Years of Education: N/A   Social History Main Topics  . Smoking status: Current Every Day Smoker -- 1.00 packs/day    Types: Cigarettes  . Smokeless tobacco: None  . Alcohol Use: 7.2 oz/week    12 Cans of beer per week     Comment: 4-8 40 oz.on the weekend  . Drug Use: Yes    Special: Marijuana, Other-see comments, Amphetamines     Comment: Adderall, "I don't do weed, only rarely"  . Sexual Activity: Not Asked   Other Topics Concern  . None   Social History Narrative   Additional Social History:    History of alcohol / drug use?: Yes Negative Consequences of Use: Personal relationships Name of Substance 1: Cocaine  1 - Age of First Use: unk 1 - Amount (size/oz): unk 1 - Frequency: umk 1 - Duration: unk 1 - Last Use / Amount: 1 line of cocaine  Name of Substance 2: Amphetaminess 2 - Age of First Use: "I don't know" 2 - Amount (size/oz): "I don't know" 2 - Duration: unknown  2 - Last Use / Amount: "It's been a while"   Allergies:   Allergies  Allergen Reactions  . Amoxicillin Hives  . Penicillins Hives and Rash    Has patient had a PCN reaction causing immediate rash, facial/tongue/throat swelling, SOB or lightheadedness with hypotension: unknown Has patient had a PCN reaction causing severe rash involving mucus membranes or skin necrosis: Yes Has patient had a PCN reaction that required hospitalization :unknown Has patient had a PCN reaction occurring within the  last 10 years: unknown If all of the above answers are "NO", then may proceed with Cephalosporin use.     Labs:  Results for orders placed or performed during the hospital encounter of 08/23/15 (from the past 48 hour(s))  Comprehensive metabolic panel     Status: Abnormal   Collection Time: 08/23/15  9:17 PM  Result Value Ref Range   Sodium 138 135 - 145 mmol/L   Potassium 3.4 (L) 3.5 - 5.1 mmol/L   Chloride 104 101 - 111 mmol/L   CO2 24 22 - 32 mmol/L   Glucose, Bld 94 65 - 99 mg/dL   BUN 13 6 - 20 mg/dL   Creatinine, Ser 0.79 0.61 - 1.24 mg/dL   Calcium 9.4 8.9 - 10.3 mg/dL   Total Protein 7.5 6.5 - 8.1 g/dL   Albumin 4.5 3.5 - 5.0 g/dL   AST 33 15 - 41 U/L   ALT 33 17 - 63 U/L   Alkaline Phosphatase 73 38 - 126 U/L   Total Bilirubin 0.6 0.3 - 1.2 mg/dL   GFR calc non Af Amer >60 >60 mL/min   GFR calc Af Amer >60 >60 mL/min    Comment: (NOTE) The eGFR has been calculated using the CKD EPI equation. This  calculation has not been validated in all clinical situations. eGFR's persistently <60 mL/min signify possible Chronic Kidney Disease.    Anion gap 10 5 - 15  CBC     Status: None   Collection Time: 08/23/15  9:17 PM  Result Value Ref Range   WBC 8.2 4.0 - 10.5 K/uL    Comment: WHITE COUNT CONFIRMED ON SMEAR   RBC 5.45 4.22 - 5.81 MIL/uL   Hemoglobin 15.7 13.0 - 17.0 g/dL   HCT 45.3 39.0 - 52.0 %   MCV 83.1 78.0 - 100.0 fL   MCH 28.8 26.0 - 34.0 pg   MCHC 34.7 30.0 - 36.0 g/dL   RDW 12.4 11.5 - 15.5 %   Platelets 201 150 - 400 K/uL  Ethanol (ETOH)     Status: None   Collection Time: 08/23/15  9:19 PM  Result Value Ref Range   Alcohol, Ethyl (B) <5 <5 mg/dL    Comment:        LOWEST DETECTABLE LIMIT FOR SERUM ALCOHOL IS 5 mg/dL FOR MEDICAL PURPOSES ONLY   Salicylate level     Status: None   Collection Time: 08/23/15  9:19 PM  Result Value Ref Range   Salicylate Lvl <6.2 2.8 - 30.0 mg/dL  Acetaminophen level     Status: Abnormal   Collection Time: 08/23/15   9:19 PM  Result Value Ref Range   Acetaminophen (Tylenol), Serum <10 (L) 10 - 30 ug/mL    Comment:        THERAPEUTIC CONCENTRATIONS VARY SIGNIFICANTLY. A RANGE OF 10-30 ug/mL MAY BE AN EFFECTIVE CONCENTRATION FOR MANY PATIENTS. HOWEVER, SOME ARE BEST TREATED AT CONCENTRATIONS OUTSIDE THIS RANGE. ACETAMINOPHEN CONCENTRATIONS >150 ug/mL AT 4 HOURS AFTER INGESTION AND >50 ug/mL AT 12 HOURS AFTER INGESTION ARE OFTEN ASSOCIATED WITH TOXIC REACTIONS.   Urine rapid drug screen (hosp performed) (Not at [facility])     Status: None   Collection Time: 08/24/15 10:26 AM  Result Value Ref Range   Opiates NONE DETECTED NONE DETECTED   Cocaine NONE DETECTED NONE DETECTED   Benzodiazepines NONE DETECTED NONE DETECTED   Amphetamines NONE DETECTED NONE DETECTED   Tetrahydrocannabinol NONE DETECTED NONE DETECTED   Barbiturates NONE DETECTED NONE DETECTED    Comment:        DRUG SCREEN FOR MEDICAL PURPOSES ONLY.  IF CONFIRMATION IS NEEDED FOR ANY PURPOSE, NOTIFY LAB WITHIN 5 DAYS.        LOWEST DETECTABLE LIMITS FOR URINE DRUG SCREEN Drug Class       Cutoff (ng/mL) Amphetamine      1000 Barbiturate      200 Benzodiazepine   563 Tricyclics       893 Opiates          300 Cocaine          300 THC              50     Current Facility-Administered Medications  Medication Dose Route Frequency Provider Last Rate Last Dose  . acetaminophen (TYLENOL) tablet 650 mg  650 mg Oral Q4H PRN Larene Pickett, PA-C      . alum & mag hydroxide-simeth (MAALOX/MYLANTA) 200-200-20 MG/5ML suspension 30 mL  30 mL Oral PRN Larene Pickett, PA-C      . ARIPiprazole (ABILIFY) tablet 5 mg  5 mg Oral BID PC Darreon Lutes      . benztropine (COGENTIN) tablet 1 mg  1 mg Oral BID [name] Soja   1 mg at 08/24/15  1351  . ibuprofen (ADVIL,MOTRIN) tablet 600 mg  600 mg Oral Q8H PRN Larene Pickett, PA-C      . LORazepam (ATIVAN) tablet 1 mg  1 mg Oral Q8H PRN Larene Pickett, PA-C      . nicotine (NICODERM CQ - dosed in  mg/24 hours) patch 21 mg  21 mg Transdermal Daily Larene Pickett, PA-C   21 mg at 08/24/15 1008  . ondansetron (ZOFRAN) tablet 4 mg  4 mg Oral Q8H PRN Larene Pickett, PA-C      . Oxcarbazepine (TRILEPTAL) tablet 300 mg  300 mg Oral BID Lenni Reckner   300 mg at 08/24/15 1351  . traZODone (DESYREL) tablet 100 mg  100 mg Oral QHS PRN Hermela Hardt       No current outpatient prescriptions on file.    Musculoskeletal: Strength & Muscle Tone: within normal limits Gait & Station: normal Patient leans: N/A  Psychiatric Specialty Exam: Review of Systems  Constitutional: Negative.   HENT: Negative.   Eyes: Negative.   Respiratory: Negative.   Cardiovascular: Negative.   Gastrointestinal: Negative.   Genitourinary: Negative.   Musculoskeletal: Negative.   Skin: Negative.   Neurological: Negative.   Endo/Heme/Allergies: Negative.     Blood pressure 120/58, pulse 103, temperature 98.3 F (36.8 C), temperature source Oral, resp. rate 16, SpO2 100 %.There is no weight on file to calculate BMI.  General Appearance: Casual  Eye Contact::  Minimal  Speech:  Clear and Coherent and Pressured  Volume:  Normal  Mood:  Angry, Anxious and Irritable  Affect:  Congruent  Thought Process:  Coherent, Goal Directed and Intact  Orientation:  Full (Time, Place, and Person)  Thought Content:  WDL  Suicidal Thoughts:  Yes.  with intent/plan  Homicidal Thoughts:  Yes.  with intent/plan  Memory:  Immediate;   Good Recent;   Good Remote;   Good  Judgement:  Poor  Insight:  Shallow  Psychomotor Activity:  Normal  Concentration:  Fair  Recall:  NA  Fund of Knowledge:Fair  Language: Good  Akathisia:  No  Handed:  Right  AIMS (if indicated):     Assets:  Desire for Improvement  ADL's:  Intact  Cognition: WNL  Sleep:      Treatment Plan Summary: Daily contact with patient to assess and evaluate symptoms and progress in treatment and Medication management  Disposition: Accepted for admission  at [facility] and will be transferred tomorrow.  We have resumes his home medications.  Delfin Gant    PMHNP-BC 08/24/2015 2:05 PM Patient seen face-to-face for psychiatric evaluation, chart reviewed and case discussed with the physician extender and developed treatment plan. Reviewed the information documented and agree with the treatment plan. Corena Pilgrim, MD

## 2015-08-24 NOTE — ED Notes (Signed)
Dr a and josephine np into see

## 2015-08-24 NOTE — ED Notes (Signed)
In the bathroom

## 2015-08-24 NOTE — Progress Notes (Signed)
Disposition CSW completed patient referrals to the following inpatient psych facilities:  [place]  CSW will follow patient for placement needs.  [place] Disposition CSW [phone]

## 2015-08-24 NOTE — ED Notes (Signed)
Report received from [facility]. Pt. Alert and oriented in no distress denies SI, HI, VH and pain.  Pt. States he still has SI without a plan. Pt. Instructed to come to me with problems or concerns.Will continue to monitor for safety via security cameras and Q 15 minute checks.

## 2015-08-24 NOTE — ED Notes (Signed)
Pt. Noted in room. No complaints or concerns voiced. No distress or abnormal behavior noted. Will continue to monitor with security cameras. Q 15 minute rounds continue. 

## 2015-08-24 NOTE — BH Assessment (Signed)
Assessment completed. Pt meets inpatient criteria. TTS to seek placement. Informed Charlann Lange, PA-C of the recommendation.

## 2015-08-25 NOTE — ED Notes (Signed)
Pt. Noted sleeping in room. No complaints or concerns voiced. No distress or abnormal behavior noted. Will continue to monitor with security cameras. Q 15 minute rounds continue. 

## 2015-08-25 NOTE — BH Assessment (Signed)
[facility] Assessment Progress Note  At 09:18 I spoke to [place] at [facility].  Pt has been accepted to their facility by Hulen Luster, MD.  Please call report to [phone].  Corena Pilgrim, MD, concurs with this decision.  Pt's nurse, Nicoletta Dress, has been notified.  Pt is under voluntary status and is to be transported via Stacey Drain, [place] Triage Specialist [phone]

## 2015-09-04 ENCOUNTER — Encounter (HOSPITAL_COMMUNITY): Payer: Self-pay

## 2015-09-04 ENCOUNTER — Encounter (HOSPITAL_COMMUNITY): Payer: Self-pay | Admitting: Emergency Medicine

## 2015-09-04 ENCOUNTER — Emergency Department (HOSPITAL_COMMUNITY): Payer: BLUE CROSS/BLUE SHIELD

## 2015-09-04 ENCOUNTER — Emergency Department (HOSPITAL_COMMUNITY)
Admission: EM | Admit: 2015-09-04 | Discharge: 2015-09-04 | Disposition: A | Discharge status: Other Acute Inpatient Hospital | Payer: BLUE CROSS/BLUE SHIELD | Attending: Emergency Medicine | Admitting: Emergency Medicine

## 2015-09-04 ENCOUNTER — Inpatient Hospital Stay (HOSPITAL_COMMUNITY)
Admission: AD | Admit: 2015-09-04 | Discharge: 2015-09-13 | DRG: 885 | Disposition: A | Discharge status: Home / Self Care | Payer: BLUE CROSS/BLUE SHIELD | Source: Intra-hospital | Attending: Psychiatry | Admitting: Psychiatry

## 2015-09-04 DIAGNOSIS — F319 Bipolar disorder, unspecified: Secondary | ICD-10-CM | POA: Diagnosis not present

## 2015-09-04 DIAGNOSIS — E669 Obesity, unspecified: Secondary | ICD-10-CM | POA: Insufficient documentation

## 2015-09-04 DIAGNOSIS — F258 Other schizoaffective disorders: Secondary | ICD-10-CM | POA: Diagnosis not present

## 2015-09-04 DIAGNOSIS — Z880 Allergy status to penicillin: Secondary | ICD-10-CM | POA: Insufficient documentation

## 2015-09-04 DIAGNOSIS — F6381 Intermittent explosive disorder: Secondary | ICD-10-CM | POA: Diagnosis present

## 2015-09-04 DIAGNOSIS — R45851 Suicidal ideations: Secondary | ICD-10-CM | POA: Diagnosis present

## 2015-09-04 DIAGNOSIS — F17210 Nicotine dependence, cigarettes, uncomplicated: Secondary | ICD-10-CM | POA: Diagnosis present

## 2015-09-04 DIAGNOSIS — F250 Schizoaffective disorder, bipolar type: Principal | ICD-10-CM | POA: Diagnosis present

## 2015-09-04 DIAGNOSIS — J45909 Unspecified asthma, uncomplicated: Secondary | ICD-10-CM | POA: Insufficient documentation

## 2015-09-04 DIAGNOSIS — Z79899 Other long term (current) drug therapy: Secondary | ICD-10-CM | POA: Insufficient documentation

## 2015-09-04 DIAGNOSIS — R000 Tachycardia, unspecified: Secondary | ICD-10-CM | POA: Insufficient documentation

## 2015-09-04 LAB — CBC
HCT: 46.2 % (ref 39.0–52.0)
Hemoglobin: 15.3 g/dL (ref 13.0–17.0)
MCH: 28.5 pg (ref 26.0–34.0)
MCHC: 33.1 g/dL (ref 30.0–36.0)
MCV: 86 fL (ref 78.0–100.0)
Platelets: 188 10*3/uL (ref 150–400)
RBC: 5.37 MIL/uL (ref 4.22–5.81)
RDW: 12.6 % (ref 11.5–15.5)
WBC: 9.3 10*3/uL (ref 4.0–10.5)

## 2015-09-04 LAB — COMPREHENSIVE METABOLIC PANEL
ALT: 53 U/L (ref 17–63)
AST: 51 U/L — ABNORMAL HIGH (ref 15–41)
Albumin: 4.8 g/dL (ref 3.5–5.0)
Alkaline Phosphatase: 80 U/L (ref 38–126)
Anion gap: 16 — ABNORMAL HIGH (ref 5–15)
BUN: 15 mg/dL (ref 6–20)
CO2: 22 mmol/L (ref 22–32)
Calcium: 9.6 mg/dL (ref 8.9–10.3)
Chloride: 101 mmol/L (ref 101–111)
Creatinine, Ser: 1.11 mg/dL (ref 0.61–1.24)
GFR calc Af Amer: >60 mL/min (ref >60)
GFR calc non Af Amer: >60 mL/min (ref >60)
Glucose, Bld: 111 mg/dL — ABNORMAL HIGH (ref 65–99)
Potassium: 3.6 mmol/L (ref 3.5–5.1)
Sodium: 139 mmol/L (ref 135–145)
Total Bilirubin: 1.2 mg/dL (ref 0.3–1.2)
Total Protein: 8.3 g/dL — ABNORMAL HIGH (ref 6.5–8.1)

## 2015-09-04 LAB — I-STAT CG4 LACTIC ACID, ED
Lactic Acid, Venous: 2.31 mmol/L (ref 0.5–2.0)
Lactic Acid, Venous: 1.71 mmol/L (ref 0.5–2.0)
Lactic Acid, Venous: 1.41 mmol/L (ref 0.5–2.0)

## 2015-09-04 LAB — URINALYSIS, ROUTINE W REFLEX MICROSCOPIC
Bilirubin Urine: NEGATIVE
Glucose, UA: NEGATIVE mg/dL
Hgb urine dipstick: NEGATIVE
Ketones, ur: NEGATIVE mg/dL
Leukocytes, UA: NEGATIVE
Nitrite: NEGATIVE
Protein, ur: 30 mg/dL — AB
Specific Gravity, Urine: 1.039 — ABNORMAL HIGH (ref 1.005–1.030)
pH: 5.5 (ref 5.0–8.0)

## 2015-09-04 LAB — RAPID STREP SCREEN (MED CTR MEBANE ONLY): Streptococcus, Group A Screen (Direct): NEGATIVE

## 2015-09-04 LAB — URINE MICROSCOPIC-ADD ON: RBC / HPF: NONE SEEN RBC/hpf (ref 0–5)

## 2015-09-04 LAB — SALICYLATE LEVEL: Salicylate Lvl: <4 mg/dL (ref 2.8–30.0)

## 2015-09-04 LAB — RAPID URINE DRUG SCREEN, HOSP PERFORMED
Amphetamines: NOT DETECTED
Barbiturates: NOT DETECTED
Benzodiazepines: NOT DETECTED
Cocaine: NOT DETECTED
Opiates: NOT DETECTED
Tetrahydrocannabinol: NOT DETECTED

## 2015-09-04 LAB — ACETAMINOPHEN LEVEL: Acetaminophen (Tylenol), Serum: <10 ug/mL — ABNORMAL LOW (ref 10–30)

## 2015-09-04 LAB — ETHANOL: Alcohol, Ethyl (B): <5 mg/dL (ref <5)

## 2015-09-04 MED ORDER — ACETAMINOPHEN 325 MG PO TABS
650.0000 mg | ORAL_TABLET | Freq: Once | ORAL | Status: AC | PRN
  Administered 2015-09-04: 650 mg via ORAL
  Filled 2015-09-04: qty 2

## 2015-09-04 MED ORDER — ALUM & MAG HYDROXIDE-SIMETH 200-200-20 MG/5ML PO SUSP: 30.0000 mL | ORAL | Status: DC | PRN

## 2015-09-04 MED ORDER — SODIUM CHLORIDE 0.9 % IV BOLUS (SEPSIS)
1000.0000 mL | Freq: Once | INTRAVENOUS | Status: AC
  Administered 2015-09-04: 1000 mL via INTRAVENOUS

## 2015-09-04 MED ORDER — ACETAMINOPHEN 325 MG PO TABS
650.0000 mg | ORAL_TABLET | Freq: Four times a day (QID) | ORAL | Status: DC | PRN
  Administered 2015-09-04: 650 mg via ORAL

## 2015-09-04 MED ORDER — MAGNESIUM HYDROXIDE 400 MG/5ML PO SUSP: 30.0000 mL | Freq: Every day | ORAL | Status: DC | PRN

## 2015-09-04 MED ORDER — TRAZODONE HCL 50 MG PO TABS: 50.0000 mg | ORAL_TABLET | Freq: Every evening | ORAL | Status: DC | PRN

## 2015-09-04 MED ORDER — IBUPROFEN 200 MG PO TABS
600.0000 mg | ORAL_TABLET | Freq: Once | ORAL | Status: AC
  Administered 2015-09-04: 600 mg via ORAL
  Filled 2015-09-04: qty 3

## 2015-09-04 NOTE — Progress Notes (Addendum)
[facility] ED CM noted pt with coverage but no pcp listed Provided pt a 3 page list of in [place] internal medicine providers within zip code [phone] [place] items in pt belonging bag  [facility] ED CM spoke with pt on how to obtain an in network pcp with insurance coverage via the customer service number or web site  Cm reviewed ED level of care for crisis/emergent services and community pcp level of care to manage continuous or chronic medical concerns.  The pt voiced understanding CM encouraged pt and discussed pt's responsibility to verify with pt's insurance carrier that any recommended medical provider offered by any emergency room or a hospital provider is within the carrier's network. The pt voiced understanding     Entered in D/C instructions  Please go to [email or web address], locate find a doctor area to use to find in network primary care provider and specialists Schedule an appointment as soon as possible for a visit As needed  Please verify any provider recommended to you is in network Please use the list of providers given to you in the [facility] long ED

## 2015-09-04 NOTE — ED Notes (Signed)
Pt oriented to room and unit.  Pt c/o suicidal ideation and depression.  15 minute checks and video monitoring in place.

## 2015-09-04 NOTE — ED Notes (Signed)
Resting quietly with eye closed. Easily arousable. Verbally responsive. Resp even and unlabored. ABC's intact. No behavior problems noted. NAD noted. Sitter at bedside.  

## 2015-09-04 NOTE — ED Provider Notes (Signed)
CSN: [id number]     Arrival date & time 09/04/15  0544 History   First MD Initiated Contact with Patient 09/04/15 0602     Chief Complaint  Patient presents with  . Suicidal    Pt [facility] to [facility]   HPI  Justin Chaney is a 20 y.o. male PMH significant for bipolar disorder, depression, seizures presenting with suicidal ideations. He states 3 days ago he tried to hang himself with a rope and the rope broke. He has had a long-standing history of suicidal ideations. He does endorse fevers, cough, sore throat, nausea, one episode of emesis last night (nonbloody nonbilious). He states he was supposed to follow-up with [facility] and he did not. He denies chest pain, shortness of breath, change in bowel or bladder habits, auditory/visual hallucinations, support system at home.  Past Medical History  Diagnosis Date  . Asthma   . Seizures ([place])   . Brain ventricular shunt obstruction     hydrochelpis  . Depression   . Bipolar disorder [facility])    Past Surgical History  Procedure Laterality Date  . Tonsillectomy    . Ventriculo-peritoneal shunt placement / laparoscopic insertion peritoneal catheter     No family history on file. Social History  Substance Use Topics  . Smoking status: Current Every Day Smoker -- 1.00 packs/day    Types: Cigarettes  . Smokeless tobacco: None  . Alcohol Use: 7.2 oz/week    12 Cans of beer per week     Comment: 4-8 40 oz.on the weekend    Review of Systems  Ten systems are reviewed and are negative for acute change except as noted in the HPI  Allergies  Amoxicillin and Penicillins  Home Medications   Prior to Admission medications   Medication Sig Start Date End Date Taking? Authorizing Provider  OLANZapine (ZYPREXA) 5 MG tablet Take 5 mg by mouth at bedtime.   Yes Historical Provider, MD   BP 117/61 mmHg  Pulse 120  Temp(Src) 100.5 F (38.1 C) (Oral)  Resp 18  SpO2 92% Physical Exam  Constitutional: He appears well-developed and  well-nourished. No distress.  Obese  HENT:  Head: Normocephalic and atraumatic.  Right Ear: External ear normal.  Left Ear: External ear normal.  Nose: Nose normal.  Mouth/Throat: Oropharynx is clear and moist. No oropharyngeal exudate.  Eyes: Conjunctivae are normal. Pupils are equal, round, and reactive to light. Right eye exhibits no discharge. Left eye exhibits no discharge. No scleral icterus.  Neck: Normal range of motion. Neck supple. No tracheal deviation present.  Cardiovascular: Regular rhythm, normal heart sounds and intact distal pulses.  Exam reveals no gallop and no friction rub.   No murmur heard. Tachycardic  Pulmonary/Chest: Effort normal and breath sounds normal. No respiratory distress. He has no wheezes. He has no rales. He exhibits no tenderness.  Abdominal: Soft. Bowel sounds are normal. He exhibits no distension and no mass. There is no tenderness. There is no rebound and no guarding.  Musculoskeletal: He exhibits no edema.  Lymphadenopathy:    He has no cervical adenopathy.  Neurological: He is alert. Coordination normal.  Skin: Skin is warm and dry. No rash noted. He is not diaphoretic. No erythema.  Psychiatric: He has a normal mood and affect. His behavior is normal.  Nursing note and vitals reviewed.   ED Course  Procedures Labs Review Labs Reviewed  COMPREHENSIVE METABOLIC PANEL - Abnormal; Notable for the following:    Glucose, Bld 111 (*)  Total Protein 8.3 (*)    AST 51 (*)    Anion gap 16 (*)    All other components within normal limits  ACETAMINOPHEN LEVEL - Abnormal; Notable for the following:    Acetaminophen (Tylenol), Serum <10 (*)    All other components within normal limits  I-STAT CG4 LACTIC ACID, ED - Abnormal; Notable for the following:    Lactic Acid, Venous 2.31 (*)    All other components within normal limits  RAPID STREP SCREEN (NOT AT [facility])  CULTURE, GROUP A STREP ([place])  ETHANOL  SALICYLATE LEVEL  CBC  URINE RAPID DRUG  SCREEN, HOSP PERFORMED  URINALYSIS, ROUTINE W REFLEX MICROSCOPIC (NOT AT [facility])   Imaging Review Dg Chest 2 View  09/04/2015  CLINICAL DATA:  Cough with fever for 3-4 days. Left-sided chest pain when coughing EXAM: CHEST  2 VIEW COMPARISON:  12/08/2014 FINDINGS: Normal heart size and mediastinal contours. No acute infiltrate or edema. No effusion or pneumothorax. No acute osseous findings. IMPRESSION: No active cardiopulmonary disease. Electronically Signed   By: Monte Fantasia M.D.   On: 09/04/2015 06:40   I have personally reviewed and evaluated these images and lab results as part of my medical decision-making.  MDM   Final diagnoses:  None   Patient febrile and tachycardic. Patient will need TTS evaluation but is not currently medically cleared. Will give fluids and tylenol and reassess.  Rapid strep, CMP, ethanol, salicylate, acetaminophen, CBC, chest x-ray, UDS unremarkable. Lactic acid elevated at 2.31. After bolus of NS, lactate normal. Patient most likely has a viral illness. Patient is medically cleared to be transferred to psych hold. Patient qualified for inpatient psych treatment.  Antoine Lions, PA-C 09/04/15 [id number]  April Palumbo, MD 09/05/15 [phone]

## 2015-09-04 NOTE — BH Assessment (Signed)
[place] Assessment Progress Note  Per Corena Pilgrim, MD, this pt requires psychiatric hospitalization at this time.  Rory Percy, RN, [facility] has assigned pt to [facility] Rm 500-2.  Pt has signed Voluntary Admission and Consent for Treatment, as well as Consent to Release Information to no one, and signed forms have been faxed to [facility].  Pt's nurse, Nena Jordan, has been notified, and agrees to send original paperwork along with pt via [facility], and to call report to [phone].  Jalene Mullet, [place] Triage Specialist [phone]

## 2015-09-04 NOTE — ED Notes (Signed)
Bed: [facility] Expected date: 09/04/15 Expected time:  Means of arrival:  Comments: Hold for Rm 31

## 2015-09-04 NOTE — BH Assessment (Addendum)
Tele Assessment Note   Patient is a 20 year old African American male that reports suicidal ideation with a plan to hang himself.  Patient reports that he attempted to hang himself with he was at a friend's home yesterday but he did not it.    Patient reports increased depression, feelings of hopelessness, despair and an inability to live.  Patient reports that, "I do not have a purpose".    When asked what is causing his increased depression the patient reports that, "it's personal and he did not want to talk about it".  Patient reports that he was recently discharged from [facility] and he did not want to go back there.  Patient refused to state why he did not want to go back to the Psychiatric Unit at [facility].    Patient reports that he has been hearing voices and seeing things for years.  Patient reports that he has been compliant with taking his medication; however, he still feels as if he wants to kill himself.  Patient reports that he is not able to contract for safety.  Documentation in the epic chart reports that the patient went to the [facility] and they told him to come to [facility] ED to get IVC'd.    Patient denies substance abuse.  Patient BAL is <5 and his UDS is negative.  Patient denies HI.  Patient refused to answer the question regarding present or a history of being physical, sexual and emotional abuse.     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 Asthma   . Seizures ([place])   . Brain ventricular shunt obstruction     hydrochelpis  . Depression   . Bipolar disorder [facility])     Past Surgical History  Procedure Laterality Date  . Tonsillectomy    . Ventriculo-peritoneal shunt placement / laparoscopic insertion peritoneal catheter      Family History: No family history on file.  Social History:  reports that he has been smoking Cigarettes.  He has been smoking about 1.00 pack per day. He does not  have any smokeless tobacco history on file. He reports that he drinks about 7.2 oz of alcohol per week. He reports that he uses illicit drugs (Marijuana, Other-see comments, and Amphetamines).  Additional Social History:  Alcohol / Drug Use History of alcohol / drug use?: No history of alcohol / drug abuse  CIWA: CIWA-Ar BP: (!) 109/33 mmHg Pulse Rate: 104 COWS:    PATIENT STRENGTHS: (choose at least two) Average or above average intelligence Communication skills  Allergies:  Allergies  Allergen Reactions  . Amoxicillin Hives  . Penicillins Hives and Rash    Has patient had a PCN reaction causing immediate rash, facial/tongue/throat swelling, SOB or lightheadedness with hypotension: unknown Has patient had a PCN reaction causing severe rash involving mucus membranes or skin necrosis: Yes Has patient had a PCN reaction that required hospitalization :unknown Has patient had a PCN reaction occurring within the last 10 years: unknown If all of the above answers are "NO", then may proceed with Cephalosporin use.     Home Medications:  (Not in a hospital admission)  OB/GYN Status:  No LMP for male patient.  General Assessment Data Location of Assessment: [facility] ED TTS Assessment: In system Is this a Tele or Face-to-Face Assessment?: Tele Assessment Is this an Initial Assessment or a Re-assessment for this encounter?: Initial Assessment Marital status: Single Is patient pregnant?:  No Pregnancy Status: No Living Arrangements: Alone, Non-relatives/Friends Can pt return to current living arrangement?: Yes Admission Status: Voluntary Is patient capable of signing voluntary admission?: Yes Referral Source: Self/Family/Friend Insurance type: [place] Living Arrangements: Alone, Non-relatives/Friends Legal Guardian:  (NA) Name of Psychiatrist: None Reported Name of Therapist: None Reported  Education Status Is patient currently in school?: No Current Grade:  NA Highest grade of school patient has completed: [place] Name of school: NA Contact person: NA  Risk to self with the past 6 months Suicidal Ideation: Yes-Currently Present Has patient been a risk to self within the past 6 months prior to admission? : Yes Suicidal Intent: Yes-Currently Present Has patient had any suicidal intent within the past 6 months prior to admission? : Yes Is patient at risk for suicide?: Yes Suicidal Plan?: Yes-Currently Present Has patient had any suicidal plan within the past 6 months prior to admission? : Yes Specify Current Suicidal Plan: Plan to hang himself Access to Means: Yes Specify Access to Suicidal Means: Rope What has been your use of drugs/alcohol within the last 12 months?: None Reported Previous Attempts/Gestures: Yes How many times?: 6 Other Self Harm Risks: None Reported Triggers for Past Attempts: Unpredictable Intentional Self Injurious Behavior: None Family Suicide History: Unknown Recent stressful life event(s): Conflict (Comment), Job Loss, Financial Problems (Strained relationship with family; Homeless) Persecutory voices/beliefs?: Yes Depression: Yes Depression Symptoms: Despondent, Insomnia, Tearfulness, Fatigue, Isolating, Guilt, Loss of interest in usual pleasures, Feeling worthless/self pity, Feeling angry/irritable Substance abuse history and/or treatment for substance abuse?: No Suicide prevention information given to non-admitted patients: Yes  Risk to Others within the past 6 months Homicidal Ideation: No Does patient have any lifetime risk of violence toward others beyond the six months prior to admission? : No Thoughts of Harm to Others: No Comment - Thoughts of Harm to Others: None Reported Current Homicidal Intent: No Current Homicidal Plan: No Describe Current Homicidal Plan: None Reported Access to Homicidal Means: No Describe Access to Homicidal Means: n Identified Victim: n History of harm to others?:  No Assessment of Violence: None Noted Violent Behavior Description: None Reported Does patient have access to weapons?: No Criminal Charges Pending?: No Does patient have a court date: No Is patient on probation?: No  Psychosis Hallucinations: Auditory, Visual Delusions: None noted  Mental Status Report Appearance/Hygiene: In scrubs Eye Contact: Fair Motor Activity: Freedom of movement, Agitation, Restlessness Speech: Logical/coherent Level of Consciousness: Irritable Mood: Depressed, Anxious, Suspicious, Irritable, Worthless, low self-esteem Affect: Apprehensive, Anxious, Angry, Irritable, Depressed, Sad Anxiety Level: Minimal Thought Processes: Relevant, Coherent Judgement: Unimpaired Orientation: Person, Place, Time, Situation Obsessive Compulsive Thoughts/Behaviors: None  Cognitive Functioning Concentration: Decreased Memory: Recent Intact, Remote Intact IQ: Average Insight: Fair Impulse Control: Poor Appetite: Fair Weight Loss: 0 Weight Gain: 0 Sleep: Decreased Total Hours of Sleep: 3 Vegetative Symptoms: Decreased grooming, Not bathing, Staying in bed  ADLScreening [facility] Assessment Services) Patient's cognitive ability adequate to safely complete daily activities?: Yes Patient able to express need for assistance with ADLs?: Yes Independently performs ADLs?: Yes (appropriate for developmental age)  Prior Inpatient Therapy Prior Inpatient Therapy: Yes Prior Therapy Dates: 2017, 2916, 2015 Prior Therapy Facilty/Provider(s): [facility], [place], [place], [facility]  Reason for Treatment: Bipolar, Depression and Substance Abuse   Prior Outpatient Therapy Prior Outpatient Therapy: No Prior Therapy Dates: None Reported Prior Therapy Facilty/Provider(s): None Reported Reason for Treatment: NA Does patient have an ACCT team?: No Does patient have Intensive In-House Services?  : No  Does patient have [facility] services? : No Does patient have P4CC services?: No  ADL Screening  (condition at time of admission) Patient's cognitive ability adequate to safely complete daily activities?: Yes Is the patient deaf or have difficulty hearing?: No Does the patient have difficulty seeing, even when wearing glasses/contacts?: No Does the patient have difficulty concentrating, remembering, or making decisions?: No Patient able to express need for assistance with ADLs?: Yes Does the patient have difficulty dressing or bathing?: No Independently performs ADLs?: Yes (appropriate for developmental age) Does the patient have difficulty walking or climbing stairs?: No Weakness of Legs: None Weakness of Arms/Hands: None  Home Assistive Devices/Equipment Home Assistive Devices/Equipment: None    Abuse/Neglect Assessment (Assessment to be complete while patient is alone) Physical Abuse: Denies Verbal Abuse: Denies Sexual Abuse: Denies Exploitation of patient/patient's resources: Denies Self-Neglect: Denies Values / Beliefs Cultural Requests During Hospitalization: None Spiritual Requests During Hospitalization: None Consults Spiritual Care Consult Needed: No Social Work Consult Needed: No Regulatory affairs officer (For Healthcare) Does patient have an advance directive?: No Would patient like information on creating an advanced directive?: No - patient declined information    Additional Information 1:1 In Past 12 Months?: No CIRT Risk: No Elopement Risk: No Does patient have medical clearance?: Yes     Disposition: Pending psych disposition.  Disposition Initial Assessment Completed for this Encounter: Yes  Rene Paci 09/04/2015 9:33 AM

## 2015-09-04 NOTE — [facility] Group Notes (Signed)
Pt did not attend [place] group.  Victorino Sparrow, MHT

## 2015-09-04 NOTE — ED Notes (Signed)
Awake. Verbally responsive. Resp even and unlabored. ABC's intact. No behavior problems noted. Pleasant and cooperative. Pt reported having SI/HI without plan and audible hallucinations. NAD noted. Sitter at bedside.

## 2015-09-04 NOTE — ED Notes (Signed)
Pt here for suicidal thoughts. When asked to elaborate, he stated its personal and he doesn't want to talk about it. He went to Crisis center and they told him to come here to get IVC'ed. Pt states that is all he wants from [facility].

## 2015-09-04 NOTE — ED Notes (Signed)
Pt discharged ambulatory with [place] driver.  All belongings were sent with patient.

## 2015-09-04 NOTE — Progress Notes (Signed)
Patient ID: Justin Chaney, male   DOB: 07-28-1996, 20 y.o.   MRN: [id number]  Pt admitted from [facility] after suicide attempt by hanging, increased depression, pt was suspicious/guarded during admission, endorses SI/HI/AVH--contracts for safety, states he will come to a staff member if he is feeling like acting on any thoughts, skin/contraband search done, pt refused to take off underwear for search, AC notified, pt did take off underwear for [facility], however pt refused to show genatalia during search, skin ok, no contraband found, oriented pt to unit and rules.

## 2015-09-04 NOTE — Tx Team (Signed)
Initial Interdisciplinary Treatment Plan   PATIENT STRESSORS: Financial difficulties   PATIENT STRENGTHS: Capable of independent living Motivation for treatment/growth   PROBLEM LIST: Problem List/Patient Goals Date to be addressed Date deferred Reason deferred Estimated date of resolution  Suicidal Ideation/Risk      Depressed Mood      Psychosis-auditory and visual      Homicidal ideation                                     DISCHARGE CRITERIA:  Improved stabilization in mood, thinking, and/or behavior Motivation to continue treatment in a less acute level of care Verbal commitment to aftercare and medication compliance  PRELIMINARY DISCHARGE PLAN: Attend aftercare/continuing care group Outpatient therapy Placement in alternative living arrangements  PATIENT/FAMIILY INVOLVEMENT: This treatment plan has been presented to and reviewed with the patient, Justin Chaney.  The patient and family have been given the opportunity to ask questions and make suggestions.  Dola Factor 09/04/2015, 5:48 PM

## 2015-09-04 NOTE — ED Notes (Signed)
Pt has in belonging bag:  One bag with clothes from [facility], black socks, red shoes, blue jeans, grey belt, blue sweater.

## 2015-09-05 ENCOUNTER — Encounter (HOSPITAL_COMMUNITY): Payer: Self-pay | Admitting: Psychiatry

## 2015-09-05 DIAGNOSIS — F6381 Intermittent explosive disorder: Secondary | ICD-10-CM | POA: Diagnosis present

## 2015-09-05 MED ORDER — LITHIUM CARBONATE 150 MG PO CAPS
150.0000 mg | ORAL_CAPSULE | Freq: Two times a day (BID) | ORAL | Status: DC
  Administered 2015-09-05 – 2015-09-08 (×6): 150 mg via ORAL
  Filled 2015-09-05 (×12): qty 1

## 2015-09-05 MED ORDER — HYDROXYZINE HCL 25 MG PO TABS: 25.0000 mg | ORAL_TABLET | Freq: Four times a day (QID) | ORAL | Status: DC | PRN

## 2015-09-05 MED ORDER — OLANZAPINE 5 MG PO TBDP: 5.0000 mg | ORAL_TABLET | Freq: Four times a day (QID) | ORAL | Status: DC | PRN

## 2015-09-05 MED ORDER — OLANZAPINE 10 MG IM SOLR: 5.0000 mg | Freq: Four times a day (QID) | INTRAMUSCULAR | Status: DC | PRN

## 2015-09-05 MED ORDER — GUAIFENESIN-DM 100-10 MG/5ML PO SYRP: 5.0000 mL | ORAL_SOLUTION | ORAL | Status: DC | PRN

## 2015-09-05 MED ORDER — IBUPROFEN 600 MG PO TABS: 600.0000 mg | ORAL_TABLET | Freq: Four times a day (QID) | ORAL | Status: DC | PRN

## 2015-09-05 NOTE — Tx Team (Signed)
Interdisciplinary Treatment Plan Update (Adult)  Date:  09/05/2015   Time Reviewed:  8:55 AM   Progress in Treatment: Attending groups: No Participating in groups:  No Taking medication as prescribed:  Yes. Tolerating medication:  Yes. Family/Significant other contact made:  No Patient understands diagnosis: No  Limited insight Discussing patient identified problems/goals with staff:  Yes, see initial care plan. Medical problems stabilized or resolved:  Yes. Denies suicidal/homicidal ideation: Yes. Issues/concerns per patient self-inventory:  No. Other:  New problem(s) identified:  Discharge Plan or Barriers: see below  Reason for Continuation of Hospitalization: Delusions  Depression Medication stabilization Other; describe Paranoia  Comments:  Pt brought in voluntarily by [employer] d/t SI/HI, also pt thinks he is the anti-christ and will rule the world for a thousand years.   Estimated length of stay: 4-5 days  New goal(s):  Review of initial/current patient goals per problem list:   Review of initial/current patient goals per problem list:  1. Goal(s): Patient will participate in aftercare plan   Met: Yes   Target date: 3-5 days post admission date   As evidenced by: Patient will participate within aftercare plan AEB aftercare provider and housing plan at discharge being identified. 09/05/15:  Plan on returning home, following up outpt   2. Goal (s): Patient will exhibit decreased depressive symptoms and suicidal ideations.   Met: No   Target date: 3-5 days post admission date   As evidenced by: Patient will utilize self rating of depression at 3 or below and demonstrate decreased signs of depression or be deemed stable for discharge by MD. 09/05/15:  Although he refuses to rate his depression, Sanav presents as grim, hopeless, helpless.      5. Goal(s): Patient will demonstrate decreased signs of psychosis  * Met: No  * Target date: 3-5 days post  admission date  * As evidenced by: Patient will demonstrate decreased frequency of AVH or return to baseline function 09/05/15:  Pt presents with paranoia, disorganization, delusions.  Has not been taking meds.           Attendees: Patient:  09/05/2015 8:55 AM   Family:   09/05/2015 8:55 AM   Physician:  Ursula Alert, MD 09/05/2015 8:55 AM   Nursing:   Hedy Jacob, RN 09/05/2015 8:55 AM   CSW:    Roque Lias, LCSW   09/05/2015 8:55 AM   Other:  09/05/2015 8:55 AM   Other:   09/05/2015 8:55 AM   Other:  Lars Pinks, Nurse CM 09/05/2015 8:55 AM   Other:   09/05/2015 8:55 AM   Other:  Norberto Sorenson, [place]  09/05/2015 8:55 AM   Other:  09/05/2015 8:55 AM   Other:  09/05/2015 8:55 AM   Other:  09/05/2015 8:55 AM   Other:  09/05/2015 8:55 AM   Other:  09/05/2015 8:55 AM   Other:   09/05/2015 8:55 AM    Scribe for Treatment Team:   [name], 09/05/2015 8:55 AM

## 2015-09-05 NOTE — Progress Notes (Signed)
  D: Pt admits to having thoughts of harming himself.  However, pt did complain of a sore throat, cough, fever and congestion.  Writer received an order for robitussin, and motrin. Pt has no questions or concerns.   A:  Support and encouragement was offered. 15 min checks continued for safety.  R: Pt remains safe.

## 2015-09-05 NOTE — Progress Notes (Signed)
DAR NOTE: Patient presents with anxious affect and depressed mood. Reports suicidal thoughts but contracts for safety.  Denies pain, auditory and visual hallucinations.  Describes energy level as normal and concentration as good.  Rates depression at 9, hopelessness at 9, and anxiety at 3.  Maintained on routine safety checks.  Medications given as prescribed.  Support and encouragement offered as needed.  Attended group and participated.  Minimal interaction with staff and peers.

## 2015-09-05 NOTE — Progress Notes (Addendum)
[facility] MD Progress Note  09/05/2015 10:31 PM Justin Chaney  MRN:  [id number] Subjective:  "I'm taking Lithium.  I just wanted to let you know.  But I still have to get my mind right." Objective:  Justin Chaney, 20 y.o. male patient admitted with Disorganized thought/speech claiming he was the anti-Christ and AH telling him to kill himself. Today he was seen.  Tolerating meds well. He is quiet.  Seen in the hallway but does isolate self and goes back to his room. He states that he is working on getting disability.    Principal Problem: Schizoaffective disorder, bipolar type (HCC) Diagnosis:   Patient Active Problem List   Diagnosis Date Noted  . Intermittent explosive disorder [F63.81] 09/05/2015  . Schizoaffective disorder, bipolar type (HCC) [F25.0] 09/04/2015  . Non compliance w medication regimen [Z91.14] 01/17/2015  . Substance induced mood disorder (HCC) [F19.94] 02/08/2014  . 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 Cannabis abuse [F12.10] 08/15/2012   Total Time spent with patient: 30 minutes  Past Psychiatric History: see above noted  Past Medical History:  Past Medical History  Diagnosis Date  . Asthma   . Seizures (HCC)   . Brain ventricular shunt obstruction     hydrochelpis  . Depression   . Bipolar disorder (HCC)     Past Surgical History  Procedure Laterality Date  . Tonsillectomy    . Ventriculo-peritoneal shunt placement / laparoscopic insertion peritoneal catheter     Family History:  Family History  Problem Relation Age of Onset  . Hypertension Mother   . Mental illness Neg Hx    Family Psychiatric  History:  See above noted  Social History:  History  Alcohol Use  . 7.2 oz/week  . 12 Cans of beer per week    Comment: 4-8 40 oz.on the weekend     History  Drug Use  . Yes  . Special: Marijuana, Other-see comments, Amphetamines    Comment: Adderall, "I don't do weed, only rarely"    Social History   Social History  .  Marital Status: Single    Spouse Name: N/A  . Number of Children: N/A  . Years of Education: N/A   Social History Main Topics  . Smoking status: Current Every Day Smoker -- 1.00 packs/day    Types: Cigarettes  . Smokeless tobacco: None  . Alcohol Use: 7.2 oz/week    12 Cans of beer per week     Comment: 4-8 40 oz.on the weekend  . Drug Use: Yes    Special: Marijuana, Other-see comments, Amphetamines     Comment: Adderall, "I don't do weed, only rarely"  . Sexual Activity: Not Asked   Other Topics Concern  . None   Social History Narrative   Additional Social History:   Sleep: Fair  Appetite:  Fair  Current Medications: Current Facility-Administered Medications  Medication Dose Route Frequency Provider Last Rate Last Dose  . acetaminophen (TYLENOL) tablet 650 mg  650 mg Oral Q6H PRN Josephine C Onuoha, NP   650 mg at 09/04/15 2346  . alum & mag hydroxide-simeth (MAALOX/MYLANTA) 200-200-20 MG/5ML suspension 30 mL  30 mL Oral Q4H PRN Josephine C Onuoha, NP      . guaiFENesin-dextromethorphan (ROBITUSSIN DM) 100-10 MG/5ML syrup 5 mL  5 mL Oral Q4H PRN  T , MD      . hydrOXYzine (ATARAX/VISTARIL) tablet 25 mg  25 mg Oral Q6H PRN Saramma Eappen, MD      . ibuprofen (ADVIL,MOTRIN)   tablet 600 mg  600 mg Oral Q6H PRN Kathlee Nations, MD      . lithium carbonate capsule 150 mg  150 mg Oral BID WC Ursula Alert, MD   150 mg at 09/05/15 1821  . magnesium hydroxide (MILK OF MAGNESIA) suspension 30 mL  30 mL Oral Daily PRN Delfin Gant, NP      . OLANZapine zydis (ZYPREXA) disintegrating tablet 5 mg  5 mg Oral Q6H PRN Ursula Alert, MD       Or  . OLANZapine (ZYPREXA) injection 5 mg  5 mg Intramuscular Q6H PRN Ursula Alert, MD      . traZODone (DESYREL) tablet 50 mg  50 mg Oral QHS PRN Harriet Butte, NP        Lab Results:  Results for orders placed or performed during the hospital encounter of 09/04/15 (from the past 48 hour(s))  Comprehensive metabolic panel      Status: Abnormal   Collection Time: 09/04/15  6:05 AM  Result Value Ref Range   Sodium 139 135 - 145 mmol/L   Potassium 3.6 3.5 - 5.1 mmol/L   Chloride 101 101 - 111 mmol/L   CO2 22 22 - 32 mmol/L   Glucose, Bld 111 (H) 65 - 99 mg/dL   BUN 15 6 - 20 mg/dL   Creatinine, Ser 1.11 0.61 - 1.24 mg/dL   Calcium 9.6 8.9 - 10.3 mg/dL   Total Protein 8.3 (H) 6.5 - 8.1 g/dL   Albumin 4.8 3.5 - 5.0 g/dL   AST 51 (H) 15 - 41 U/L   ALT 53 17 - 63 U/L   Alkaline Phosphatase 80 38 - 126 U/L   Total Bilirubin 1.2 0.3 - 1.2 mg/dL   GFR calc non Af Amer >60 >60 mL/min   GFR calc Af Amer >60 >60 mL/min    Comment: (NOTE) The eGFR has been calculated using the CKD EPI equation. This calculation has not been validated in all clinical situations. eGFR's persistently <60 mL/min signify possible Chronic Kidney Disease.    Anion gap 16 (H) 5 - 15  Ethanol (ETOH)     Status: None   Collection Time: 09/04/15  6:05 AM  Result Value Ref Range   Alcohol, Ethyl (B) <5 <5 mg/dL    Comment:        LOWEST DETECTABLE LIMIT FOR SERUM ALCOHOL IS 5 mg/dL FOR MEDICAL PURPOSES ONLY   Salicylate level     Status: None   Collection Time: 09/04/15  6:05 AM  Result Value Ref Range   Salicylate Lvl <1.3 2.8 - 30.0 mg/dL  Acetaminophen level     Status: Abnormal   Collection Time: 09/04/15  6:05 AM  Result Value Ref Range   Acetaminophen (Tylenol), Serum <10 (L) 10 - 30 ug/mL    Comment:        THERAPEUTIC CONCENTRATIONS VARY SIGNIFICANTLY. A RANGE OF 10-30 ug/mL MAY BE AN EFFECTIVE CONCENTRATION FOR MANY PATIENTS. HOWEVER, SOME ARE BEST TREATED AT CONCENTRATIONS OUTSIDE THIS RANGE. ACETAMINOPHEN CONCENTRATIONS >150 ug/mL AT 4 HOURS AFTER INGESTION AND >50 ug/mL AT 12 HOURS AFTER INGESTION ARE OFTEN ASSOCIATED WITH TOXIC REACTIONS.   CBC     Status: None   Collection Time: 09/04/15  6:05 AM  Result Value Ref Range   WBC 9.3 4.0 - 10.5 K/uL   RBC 5.37 4.22 - 5.81 MIL/uL   Hemoglobin 15.3 13.0 - 17.0  g/dL   HCT 46.2 39.0 - 52.0 %   MCV 86.0 78.0 -  100.0 fL   MCH 28.5 26.0 - 34.0 pg   MCHC 33.1 30.0 - 36.0 g/dL   RDW 12.6 11.5 - 15.5 %   Platelets 188 150 - 400 K/uL  Urine rapid drug screen (hosp performed) (Not at [facility])     Status: None   Collection Time: 09/04/15  6:28 AM  Result Value Ref Range   Opiates NONE DETECTED NONE DETECTED   Cocaine NONE DETECTED NONE DETECTED   Benzodiazepines NONE DETECTED NONE DETECTED   Amphetamines NONE DETECTED NONE DETECTED   Tetrahydrocannabinol NONE DETECTED NONE DETECTED   Barbiturates NONE DETECTED NONE DETECTED    Comment:        DRUG SCREEN FOR MEDICAL PURPOSES ONLY.  IF CONFIRMATION IS NEEDED FOR ANY PURPOSE, NOTIFY LAB WITHIN 5 DAYS.        LOWEST DETECTABLE LIMITS FOR URINE DRUG SCREEN Drug Class       Cutoff (ng/mL) Amphetamine      1000 Barbiturate      200 Benzodiazepine   370 Tricyclics       488 Opiates          300 Cocaine          300 THC              50   Urinalysis, Routine w reflex microscopic (not at [facility])     Status: Abnormal   Collection Time: 09/04/15  6:28 AM  Result Value Ref Range   Color, Urine AMBER (A) YELLOW    Comment: BIOCHEMICALS MAY BE AFFECTED BY COLOR   APPearance CLEAR CLEAR   Specific Gravity, Urine 1.039 (H) 1.005 - 1.030   pH 5.5 5.0 - 8.0   Glucose, UA NEGATIVE NEGATIVE mg/dL   Hgb urine dipstick NEGATIVE NEGATIVE   Bilirubin Urine NEGATIVE NEGATIVE   Ketones, ur NEGATIVE NEGATIVE mg/dL   Protein, ur 30 (A) NEGATIVE mg/dL   Nitrite NEGATIVE NEGATIVE   Leukocytes, UA NEGATIVE NEGATIVE  Urine microscopic-add on     Status: Abnormal   Collection Time: 09/04/15  6:28 AM  Result Value Ref Range   Squamous Epithelial / LPF 0-5 (A) NONE SEEN   WBC, UA 0-5 0 - 5 WBC/hpf   RBC / HPF NONE SEEN 0 - 5 RBC/hpf   Bacteria, UA RARE (A) NONE SEEN  Rapid strep screen (not at [facility])     Status: None   Collection Time: 09/04/15  6:36 AM  Result Value Ref Range   Streptococcus, Group A Screen (Direct)  NEGATIVE NEGATIVE    Comment: (NOTE) A Rapid Antigen test may result negative if the antigen level in the sample is below the detection level of this test. The FDA has not cleared this test as a stand-alone test therefore the rapid antigen negative result has reflexed to a Group A Strep culture.   Culture, group A strep     Status: None (Preliminary result)   Collection Time: 09/04/15  6:36 AM  Result Value Ref Range   Specimen Description THROAT    Special Requests NONE Reflexed from [id number]    Culture      CULTURE REINCUBATED FOR BETTER GROWTH Performed at [facility]    Report Status PENDING   I-Stat CG4 Lactic Acid, ED     Status: Abnormal   Collection Time: 09/04/15  6:38 AM  Result Value Ref Range   Lactic Acid, Venous 2.31 (HH) 0.5 - 2.0 mmol/L   Comment NOTIFIED PHYSICIAN   I-Stat CG4 Lactic Acid, ED  Status: None   Collection Time: 09/04/15  8:00 AM  Result Value Ref Range   Lactic Acid, Venous 1.71 0.5 - 2.0 mmol/L  I-Stat CG4 Lactic Acid, ED     Status: None   Collection Time: 09/04/15 10:43 AM  Result Value Ref Range   Lactic Acid, Venous 1.41 0.5 - 2.0 mmol/L    Physical Findings: AIMS: Facial and Oral Movements Muscles of Facial Expression: None, normal Lips and Perioral Area: None, normal Jaw: None, normal Tongue: None, normal,Extremity Movements Upper (arms, wrists, hands, fingers): None, normal Lower (legs, knees, ankles, toes): None, normal, Trunk Movements Neck, shoulders, hips: None, normal, Overall Severity Severity of abnormal movements (highest score from questions above): None, normal Incapacitation due to abnormal movements: None, normal Patient's awareness of abnormal movements (rate only patient's report): No Awareness, Dental Status Current problems with teeth and/or dentures?: No Does patient usually wear dentures?: No  CIWA:  CIWA-Ar Total: 2 COWS:     Musculoskeletal: Strength & Muscle Tone: within normal limits Gait &  Station: normal Patient leans: N/A  Psychiatric Specialty Exam: Review of Systems  All other systems reviewed and are negative.   Blood pressure 119/67, pulse 122, temperature 99.3 F (37.4 C), temperature source Oral, resp. rate 20, height 5' 2" (1.575 m), weight 100.699 kg (222 lb), SpO2 98 %.Body mass index is 40.59 kg/(m^2).   General Appearance: Casual  Eye Contact:: Good  Speech: Slow  Volume: Decreased  Mood: Depressed and Hopeless  Affect: Restricted  Thought Process: Loose  Orientation: Full (Time, Place, and Person)  Thought Content: Hallucinations: Auditory and Rumination  Suicidal Thoughts: Yes. without intent/plan  Homicidal Thoughts: No  Memory: Immediate; Fair Recent; Fair Remote; Fair  Judgement: Fair  Insight: Fair  Psychomotor Activity: Normal  Concentration: Fair  Recall: Fair  Fund of Knowledge:Fair  Language: Good  Akathisia: Negative  Handed: Right  AIMS (if indicated):    Assets: Resilience  ADL's: Intact  Cognition: WNL  Sleep: Number of Hours: 6       Treatment Plan Summary: Admit for crisis management and mood stabilization. Medication management to re-stabilize current mood symptoms Group counseling sessions for coping skills Medical consults as needed Review and reinstate any pertinent home medications for other health problems start a trial of Lithium 150 mg po bid for mood lability/chronic suicidality. Li level in 5 days. Trazodone 50 mg po qhs prn for sleep. PRN medications as per agitation protocol. Review and reinstate any pertinent home medications for other health problems.   Sheila May Agustin, NP 09/05/2015, 10:31 PM  I agreed with the findings, treatment and disposition plan of this patient.   , MD 

## 2015-09-05 NOTE — Progress Notes (Signed)
Did not attended group 

## 2015-09-05 NOTE — [facility] Group Notes (Signed)
[facility] LCSW Group Therapy   09/05/2015 1:57 PM  Type of Therapy: Group Therapy  Participation Level:  Active  Participation Quality:  Attentive  Affect:  Flat  Cognitive:  Oriented  Insight:  Limited  Engagement in Therapy:  Engaged  Modes of Intervention:  Discussion and Socialization  Summary of Progress/Problems: Chaplain was here to lead a group on themes of hope and/or courage.  Attended and stayed for the whole group although he did not participate. When asked to share pt stated that he would rather not speak.   Georga Kaufmann 09/05/2015 1:57 PM

## 2015-09-05 NOTE — Progress Notes (Signed)
Justin Chaney states he did not have a goal today. He does not remember if he went to group today or not. He states he is still feeling suicidal with intermittent AVH with no command to harm. He is able to contract for safety. Denies any anxiety or depression at this time. Encouragement and support given. Medications administered as prescribed. Continue to monitor Q 15 minutes for patient safety and medication effectiveness.

## 2015-09-05 NOTE — [facility] Suicide Risk Assessment (Signed)
[facility] Admission Suicide Risk Assessment   Nursing information obtained from:  Patient Demographic factors:  Unemployed, Low socioeconomic status Current Mental Status:  Suicidal ideation indicated by patient Loss Factors:  Financial problems / change in socioeconomic status Historical Factors:  Prior suicide attempts Risk Reduction Factors:  NA  Total Time spent with patient: 30 minutes Principal Problem: Schizoaffective disorder, bipolar type ([place]) Diagnosis:   Patient Active Problem List   Diagnosis Date Noted  . Intermittent explosive disorder [F63.81] 09/05/2015  . Schizoaffective disorder, bipolar type ([place]) [F25.0] 09/04/2015  . Non compliance w medication regimen [Z91.14] 01/17/2015  . Substance induced mood disorder ([place]) [F19.94] 02/08/2014  . 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 Cannabis abuse [F12.10] 08/15/2012   Subjective Data: Patient states " I am depressed. No one can change my situation. I just want to die. I have been on several medications like zyprexa(makes me sleepy), seroquel ( drowsy), haldol ( slows me down) ,abilify ( lack of efficacy), risperidone( gives me breast). I am OK with starting Lithium , but I do not want to be on anything else. Pt denies substance abuse - reports "I do not get high.'     Continued Clinical Symptoms:  Alcohol Use Disorder Identification Test Final Score (AUDIT): 0 The "Alcohol Use Disorders Identification Test", Guidelines for Use in Primary Care, Second Edition.  World Pharmacologist [facility]). Score between 0-7:  no or low risk or alcohol related problems. Score between 8-15:  moderate risk of alcohol related problems. Score between 16-19:  high risk of alcohol related problems. Score 20 or above:  warrants further diagnostic evaluation for alcohol dependence and treatment.   CLINICAL FACTORS:   Severe Anxiety and/or Agitation Depression:   Anhedonia Hopelessness Impulsivity Unstable or Poor  Therapeutic Relationship Previous Psychiatric Diagnoses and Treatments   Musculoskeletal: Strength & Muscle Tone: within normal limits Gait & Station: normal Patient leans: N/A  Psychiatric Specialty Exam: Review of Systems  Psychiatric/Behavioral: Positive for depression, suicidal ideas and hallucinations. The patient is nervous/anxious.   All other systems reviewed and are negative.   Blood pressure 119/67, pulse 122, temperature 99.3 F (37.4 C), temperature source Oral, resp. rate 20, height 5\' 2"  (1.575 m), weight 100.699 kg (222 lb), SpO2 98 %.Body mass index is 40.59 kg/(m^2).  General Appearance: Casual  Eye Contact::  Fair  Speech:  Normal Rate  Volume:  Normal  Mood:  Anxious, Depressed, Hopeless and Irritable  Affect:  Depressed  Thought Process:  Goal Directed  Orientation:  Full (Time, Place, and Person)  Thought Content:  Hallucinations: Auditory Visual and Rumination reports that I see and hear things that you do not understand - but it is not distressing  Suicidal Thoughts:  Yes.  with intent/plan but does not have a plan now will come up with something. Had plan prior to admission  Homicidal Thoughts:  No  Memory:  Immediate;   Fair Recent;   Fair Remote;   Fair  Judgement:  Impaired  Insight:  Shallow  Psychomotor Activity:  Normal  Concentration:  Poor  Recall:  [employer] of Knowledge:Fair  Language: Fair  Akathisia:  No  Handed:  Right  AIMS (if indicated):     Assets:  Desire for Improvement  Sleep:  Number of Hours: 6  Cognition: WNL  ADL's:  Intact    COGNITIVE FEATURES THAT CONTRIBUTE TO RISK:  Closed-mindedness, Polarized thinking and Thought constriction (tunnel vision)    SUICIDE RISK:   Severe:  Frequent,  intense, and enduring suicidal ideation, specific plan, no subjective intent, but some objective markers of intent (i.e., choice of lethal method), the method is accessible, some limited preparatory behavior, evidence of impaired  self-control, severe dysphoria/symptomatology, multiple risk factors present, and few if any protective factors, particularly a lack of social support.  PLAN OF CARE: Patient will benefit from inpatient treatment and stabilization.  Estimated length of stay is 5-7 days.  Reviewed past medical records,treatment plan.  Will start a trial of Lithium 150 mg po bid for mood lability/chronic suicidality.Li level in 5 days. Will make available Trazodone 50 mg po qhs prn for sleep. Will make available PRN medications as per agitation protocol. Will continue to monitor vitals ,medication compliance and treatment side effects while patient is here.  Will monitor for medical issues as well as call consult as needed.  Reviewed labs ,cbc- wnl, cmp - wnl,TSH - pending, EKG pending, UDS - negative ,BAL<5. CSW will start working on disposition.  Patient to participate in therapeutic milieu .      I certify that inpatient services furnished can reasonably be expected to improve the patient's condition.   Libbey Duce, MD 09/05/2015, 12:25 PM

## 2015-09-05 NOTE — H&P (Signed)
Psychiatric Admission Assessment Adult  Patient Identification: Justin Chaney MRN:  [id number] Date of Evaluation:  09/05/2015 Chief Complaint:  SCHIZOAFFECTIVE Principal Diagnosis: Schizoaffective disorder, bipolar type (HCC) Diagnosis:   Patient Active Problem List   Diagnosis Date Noted  . Schizoaffective disorder, bipolar type (HCC) [F25.0] 09/04/2015  . Non compliance w medication regimen [Z91.14] 01/17/2015  . Substance induced mood disorder (HCC) [F19.94] 02/08/2014  . 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 Cannabis abuse [F12.10] 08/15/2012   History of Present Illness: Justin Chaney, 19 y.o. male patient admitted with Disorganized thought/speech claiming he was the anti-Christ and AH telling him to kill himself.  He was initially seen at [facility] with c/o of suicidal ideations.  He also had disorganized speech.  Today he was seen and patient was irritable.  He states that 3 days ago he felt that he did not want to live anymore.  He also reported that he was recently discharged from [facility], "where they usually send me and I they told me to take Zyprexa."  When asked if he has any other meds he takes on a regular basis, he said no.    Patient had flat affect.  He states that there were no medications that ever worked for him.  Patient was irritable and dismissive of this NP.    Associated Signs/Symptoms: Depression Symptoms:  depressed mood, hopelessness, anxiety, (Hypo) Manic Symptoms:  Distractibility, Irritable Mood, Labiality of Mood, Anxiety Symptoms:  Excessive Worry, Psychotic Symptoms:  Hallucinations: Auditory PTSD Symptoms: NA Total Time spent with patient: 45 minutes  Past Psychiatric History:  Schizoaffective, bipolar  Risk to Self: Is patient at risk for suicide?: Yes Risk to Others:   Prior Inpatient Therapy:   Prior Outpatient Therapy:    Alcohol Screening: 1. How often do you have a drink containing alcohol?: Never 9. Have you  or someone else been injured as a result of your drinking?: No 10. Has a relative or friend or a doctor or another health worker been concerned about your drinking or suggested you cut down?: No Alcohol Use Disorder Identification Test Final Score (AUDIT): 0 Brief Intervention: AUDIT score less than 7 or less-screening does not suggest unhealthy drinking-brief intervention not indicated Substance Abuse History in the last 12 months:  Yes.   Consequences of Substance Abuse: NA Previous Psychotropic Medications: Yes  Psychological Evaluations: Yes  Past Medical History:  Past Medical History  Diagnosis Date  . Asthma   . Seizures (HCC)   . Brain ventricular shunt obstruction     hydrochelpis  . Depression   . Bipolar disorder (HCC)     Past Surgical History  Procedure Laterality Date  . Tonsillectomy    . Ventriculo-peritoneal shunt placement / laparoscopic insertion peritoneal catheter     Family History:  Family History  Problem Relation Age of Onset  . Hypertension Mother   . Mental illness Neg Hx    Family Psychiatric  History:  Denies Tobacco Screening: @FLOW([id number])::1)@ Social History:  History  Alcohol Use  . 7.2 oz/week  . 12 Cans of beer per week    Comment: 4-8 40 oz.on the weekend     History  Drug Use  . Yes  . Special: Marijuana, Other-see comments, Amphetamines    Comment: Adderall, "I don't do weed, only rarely"    Additional Social History:  Allergies:   Allergies  Allergen Reactions  . Amoxicillin Hives  . Penicillins Hives and Rash    Has patient had a PCN reaction   causing immediate rash, facial/tongue/throat swelling, SOB or lightheadedness with hypotension: unknown Has patient had a PCN reaction causing severe rash involving mucus membranes or skin necrosis: Yes Has patient had a PCN reaction that required hospitalization :unknown Has patient had a PCN reaction occurring within the last 10 years: unknown If all of the above answers are  "NO", then may proceed with Cephalosporin use.    Lab Results:  Results for orders placed or performed during the hospital encounter of 09/04/15 (from the past 48 hour(s))  Comprehensive metabolic panel     Status: Abnormal   Collection Time: 09/04/15  6:05 AM  Result Value Ref Range   Sodium 139 135 - 145 mmol/L   Potassium 3.6 3.5 - 5.1 mmol/L   Chloride 101 101 - 111 mmol/L   CO2 22 22 - 32 mmol/L   Glucose, Bld 111 (H) 65 - 99 mg/dL   BUN 15 6 - 20 mg/dL   Creatinine, Ser 1.11 0.61 - 1.24 mg/dL   Calcium 9.6 8.9 - 10.3 mg/dL   Total Protein 8.3 (H) 6.5 - 8.1 g/dL   Albumin 4.8 3.5 - 5.0 g/dL   AST 51 (H) 15 - 41 U/L   ALT 53 17 - 63 U/L   Alkaline Phosphatase 80 38 - 126 U/L   Total Bilirubin 1.2 0.3 - 1.2 mg/dL   GFR calc non Af Amer >60 >60 mL/min   GFR calc Af Amer >60 >60 mL/min    Comment: (NOTE) The eGFR has been calculated using the CKD EPI equation. This calculation has not been validated in all clinical situations. eGFR's persistently <60 mL/min signify possible Chronic Kidney Disease.    Anion gap 16 (H) 5 - 15  Ethanol (ETOH)     Status: None   Collection Time: 09/04/15  6:05 AM  Result Value Ref Range   Alcohol, Ethyl (B) <5 <5 mg/dL    Comment:        LOWEST DETECTABLE LIMIT FOR SERUM ALCOHOL IS 5 mg/dL FOR MEDICAL PURPOSES ONLY   Salicylate level     Status: None   Collection Time: 09/04/15  6:05 AM  Result Value Ref Range   Salicylate Lvl <4.0 2.8 - 30.0 mg/dL  Acetaminophen level     Status: Abnormal   Collection Time: 09/04/15  6:05 AM  Result Value Ref Range   Acetaminophen (Tylenol), Serum <10 (L) 10 - 30 ug/mL    Comment:        THERAPEUTIC CONCENTRATIONS VARY SIGNIFICANTLY. A RANGE OF 10-30 ug/mL MAY BE AN EFFECTIVE CONCENTRATION FOR MANY PATIENTS. HOWEVER, SOME ARE BEST TREATED AT CONCENTRATIONS OUTSIDE THIS RANGE. ACETAMINOPHEN CONCENTRATIONS >150 ug/mL AT 4 HOURS AFTER INGESTION AND >50 ug/mL AT 12 HOURS AFTER INGESTION ARE OFTEN  ASSOCIATED WITH TOXIC REACTIONS.   CBC     Status: None   Collection Time: 09/04/15  6:05 AM  Result Value Ref Range   WBC 9.3 4.0 - 10.5 K/uL   RBC 5.37 4.22 - 5.81 MIL/uL   Hemoglobin 15.3 13.0 - 17.0 g/dL   HCT 46.2 39.0 - 52.0 %   MCV 86.0 78.0 - 100.0 fL   MCH 28.5 26.0 - 34.0 pg   MCHC 33.1 30.0 - 36.0 g/dL   RDW 12.6 11.5 - 15.5 %   Platelets 188 150 - 400 K/uL  Urine rapid drug screen (hosp performed) (Not at [facility])     Status: None   Collection Time: 09/04/15  6:28 AM  Result Value Ref Range     Opiates NONE DETECTED NONE DETECTED   Cocaine NONE DETECTED NONE DETECTED   Benzodiazepines NONE DETECTED NONE DETECTED   Amphetamines NONE DETECTED NONE DETECTED   Tetrahydrocannabinol NONE DETECTED NONE DETECTED   Barbiturates NONE DETECTED NONE DETECTED    Comment:        DRUG SCREEN FOR MEDICAL PURPOSES ONLY.  IF CONFIRMATION IS NEEDED FOR ANY PURPOSE, NOTIFY LAB WITHIN 5 DAYS.        LOWEST DETECTABLE LIMITS FOR URINE DRUG SCREEN Drug Class       Cutoff (ng/mL) Amphetamine      1000 Barbiturate      200 Benzodiazepine   200 Tricyclics       300 Opiates          300 Cocaine          300 THC              50   Urinalysis, Routine w reflex microscopic (not at [facility])     Status: Abnormal   Collection Time: 09/04/15  6:28 AM  Result Value Ref Range   Color, Urine AMBER (A) YELLOW    Comment: BIOCHEMICALS MAY BE AFFECTED BY COLOR   APPearance CLEAR CLEAR   Specific Gravity, Urine 1.039 (H) 1.005 - 1.030   pH 5.5 5.0 - 8.0   Glucose, UA NEGATIVE NEGATIVE mg/dL   Hgb urine dipstick NEGATIVE NEGATIVE   Bilirubin Urine NEGATIVE NEGATIVE   Ketones, ur NEGATIVE NEGATIVE mg/dL   Protein, ur 30 (A) NEGATIVE mg/dL   Nitrite NEGATIVE NEGATIVE   Leukocytes, UA NEGATIVE NEGATIVE  Urine microscopic-add on     Status: Abnormal   Collection Time: 09/04/15  6:28 AM  Result Value Ref Range   Squamous Epithelial / LPF 0-5 (A) NONE SEEN   WBC, UA 0-5 0 - 5 WBC/hpf   RBC / HPF NONE  SEEN 0 - 5 RBC/hpf   Bacteria, UA RARE (A) NONE SEEN  Rapid strep screen (not at [facility])     Status: None   Collection Time: 09/04/15  6:36 AM  Result Value Ref Range   Streptococcus, Group A Screen (Direct) NEGATIVE NEGATIVE    Comment: (NOTE) A Rapid Antigen test may result negative if the antigen level in the sample is below the detection level of this test. The FDA has not cleared this test as a stand-alone test therefore the rapid antigen negative result has reflexed to a Group A Strep culture.   I-Stat CG4 Lactic Acid, ED     Status: Abnormal   Collection Time: 09/04/15  6:38 AM  Result Value Ref Range   Lactic Acid, Venous 2.31 (HH) 0.5 - 2.0 mmol/L   Comment NOTIFIED PHYSICIAN   I-Stat CG4 Lactic Acid, ED     Status: None   Collection Time: 09/04/15  8:00 AM  Result Value Ref Range   Lactic Acid, Venous 1.71 0.5 - 2.0 mmol/L  I-Stat CG4 Lactic Acid, ED     Status: None   Collection Time: 09/04/15 10:43 AM  Result Value Ref Range   Lactic Acid, Venous 1.41 0.5 - 2.0 mmol/L    Metabolic Disorder Labs:  No results found for: HGBA1C, MPG No results found for: PROLACTIN No results found for: CHOL, TRIG, HDL, CHOLHDL, VLDL, LDLCALC  Current Medications: Current Facility-Administered Medications  Medication Dose Route Frequency Provider Last Rate Last Dose  . acetaminophen (TYLENOL) tablet 650 mg  650 mg Oral Q6H PRN Josephine C Onuoha, NP   650 mg at 09/04/15 2346  .   alum & mag hydroxide-simeth (MAALOX/MYLANTA) 200-200-20 MG/5ML suspension 30 mL  30 mL Oral Q4H PRN Josephine C Onuoha, NP      . guaiFENesin-dextromethorphan (ROBITUSSIN DM) 100-10 MG/5ML syrup 5 mL  5 mL Oral Q4H PRN Syed T Arfeen, MD      . hydrOXYzine (ATARAX/VISTARIL) tablet 25 mg  25 mg Oral Q6H PRN Saramma Eappen, MD      . ibuprofen (ADVIL,MOTRIN) tablet 600 mg  600 mg Oral Q6H PRN Syed T Arfeen, MD      . lithium carbonate capsule 150 mg  150 mg Oral BID WC Saramma Eappen, MD      . magnesium hydroxide  (MILK OF MAGNESIA) suspension 30 mL  30 mL Oral Daily PRN Josephine C Onuoha, NP      . OLANZapine zydis (ZYPREXA) disintegrating tablet 5 mg  5 mg Oral Q6H PRN Saramma Eappen, MD       Or  . OLANZapine (ZYPREXA) injection 5 mg  5 mg Intramuscular Q6H PRN Saramma Eappen, MD      . traZODone (DESYREL) tablet 50 mg  50 mg Oral QHS PRN Ijeoma E Nwaeze, NP       PTA Medications: Prescriptions prior to admission  Medication Sig Dispense Refill Last Dose  . OLANZapine (ZYPREXA) 5 MG tablet Take 5 mg by mouth at bedtime.   Past Week at Unknown time    Musculoskeletal: Strength & Muscle Tone: within normal limits Gait & Station: normal Patient leans: N/A  Psychiatric Specialty Exam: Physical Exam  Vitals reviewed.   Review of Systems  All other systems reviewed and are negative.   Blood pressure 119/67, pulse 122, temperature 99.3 F (37.4 C), temperature source Oral, resp. rate 20, height 5' 2" (1.575 m), weight 100.699 kg (222 lb), SpO2 98 %.Body mass index is 40.59 kg/(m^2).  General Appearance: Casual  Eye Contact::  Good  Speech:  Slow  Volume:  Decreased  Mood:  Depressed and Hopeless  Affect:  Restricted  Thought Process:  Loose  Orientation:  Full (Time, Place, and Person)  Thought Content:  Hallucinations: Auditory and Rumination  Suicidal Thoughts:  Yes.  without intent/plan  Homicidal Thoughts:  No  Memory:  Immediate;   Fair Recent;   Fair Remote;   Fair  Judgement:  Fair  Insight:  Fair  Psychomotor Activity:  Normal  Concentration:  Fair  Recall:  Fair  Fund of Knowledge:Fair  Language: Good  Akathisia:  Negative  Handed:  Right  AIMS (if indicated):     Assets:  Resilience  ADL's:  Intact  Cognition: WNL  Sleep:  Number of Hours: 6   Treatment Plan Summary: Admit for crisis management and mood stabilization. Medication management to re-stabilize current mood symptoms start a trial of Lithium 150 mg po bid for mood lability/chronic suicidality.  Li  level in 5 days. Trazodone 50 mg po qhs prn for sleep.  PRN medications as per agitation protocol. Group counseling sessions for coping skills Medical consults as needed Review and reinstate any pertinent home medications for other health problems.  Observation Level/Precautions:  15 minute checks  Laboratory:  per ED, cbc- wnl, cmp - wnl,TSH - pending, EKG pending, UDS - negative ,BAL<5.  Psychotherapy:  group  Medications:  As per medlist  Consultations:  As needed  Discharge Concerns:  safety  Estimated LOS:  3-7 days  Other:     I certify that inpatient services furnished can reasonably be expected to improve the patient's condition.       May , NP AGNP-BC 2/3/201712:15 PM  

## 2015-09-05 NOTE — BHH Group Notes (Signed)
[facility] LCSW Aftercare Discharge Planning Group Note   09/05/2015 10:12 AM  Participation Quality: Invited. Chose not to attend.   Georga Kaufmann
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
## 2015-09-06 DIAGNOSIS — F250 Schizoaffective disorder, bipolar type: Principal | ICD-10-CM

## 2015-09-06 DIAGNOSIS — R45851 Suicidal ideations: Secondary | ICD-10-CM

## 2015-09-06 LAB — TSH: TSH: 2.546 u[IU]/mL (ref 0.350–4.500)

## 2015-09-06 NOTE — Progress Notes (Addendum)
D: Pt  Passive SI-contracts, AVH  for safety. denies /HI. Pt is pleasant and cooperative. Pt visible on unit and joking and laughing with peers/ staff this evening.   A: Pt was offered support and encouragement. Pt was given scheduled medications. Pt was encourage to attend groups. Q 15 minute checks were done for safety.   R:Pt attends groups and interacts well with peers and staff. Pt is taking medication. Pt has no complaints at this time.Pt receptive to treatment and safety maintained on unit.

## 2015-09-06 NOTE — BHH Group Notes (Signed)
[facility] Group Notes: (Clinical Social Work)   09/06/2015      Type of Therapy:  Group Therapy   Participation Level:  Did Not Attend despite MHT prompting   Selmer Dominion, LCSW 09/06/2015, 12:50 PM

## 2015-09-06 NOTE — Progress Notes (Signed)
Patient isolative to self, room. Interacts minimally. Little to no eye contact. Rating depression at a 7/10, hopelessness at a 4/10 and anxiety at a 0/10. Denies pain, physical problems. Affect flat, mood guarded. Forwards minimal information. Medicated per orders, emotional support offered. He endorses AVH but states, "that's all the time. That's not why I'm here." Admits to passive SI/HI but verbally contracts for safety. Cooperative thus far today. Remains safe on level IIi obs. Justin Chaney

## 2015-09-06 NOTE — Plan of Care (Signed)
Problem: Ineffective individual coping Goal: STG: Patient will remain free from self harm Outcome: Progressing Patient has not engaged in self harm, contracts for safety.  Problem: Diagnosis: Increased Risk For Suicide Attempt Goal: STG-Patient Will Comply With Medication Regime Outcome: Not Progressing Patient refusing meds intermittently.

## 2015-09-06 NOTE — Plan of Care (Signed)
Problem: Ineffective individual coping Goal: STG: Patient will remain free from self harm Outcome: Progressing Pt safe on the unit at this time     

## 2015-09-07 LAB — CULTURE, GROUP A STREP (THRC)

## 2015-09-07 NOTE — BHH Group Notes (Signed)
[facility] Group Notes:  Healthy coping skills  Date:  09/07/2015  Time:  1300  Type of Therapy:  Nurse Education  Participation Level:  Did Not Attend  Participation Quality:  Inattentive  Affect:  Blunted  Cognitive:  Lacking  Insight:  None  Engagement in Group:  None  Modes of Intervention:  Discussion  Summary of Progress/Problems:Pt was asleep during group  Marcello [facility] 09/07/2015, 4:36 PM

## 2015-09-07 NOTE — Progress Notes (Signed)
Patient ID: Justin Chaney, male   DOB: 1995/10/27, 20 y.o.   MRN: [id number] D: Patient in dayroom watching game with peers. Pt reports his day has been good since he moved to a this hall. Pt mood and affect appeared depressed. Pt endorses AH with command to hurt people. Pt reports able to tune out voices. Pt denies SI/HI/AH and pain. Cooperative with assessment.   A: Support and encouragement provided to attend groups and engage in milieu. Pt encouraged to discuss feelings and come to staff with any question or concerns.  R: Patient remains safe.

## 2015-09-07 NOTE — Progress Notes (Signed)
Patient ID: Justin Chaney, male   DOB: 1995-11-04, 20 y.o.   MRN: [id number]   Pt currently presents with a blunted affect and cooperative behavior. Per self inventory, pt rates depression at a 7, hopelessness 4 and anxiety 0. Pt showed positive coping skills today on the unit. Pt reports good sleep, a good appetite, normal energy and good concentration.   Pt provided with medications per providers orders. Pt's labs and vitals were monitored throughout the day. Pt supported emotionally and encouraged to express concerns and questions. Pt educated on medications.  Pt's safety ensured with 15 minute and environmental checks. Pt currently endorses passive SI. Pt currently denies HI. Pt verbally agrees to seek staff if SI/HI or A/VH worsens and to consult with staff before acting on any harmful thoughts. Will continue POC.
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
## 2015-09-07 NOTE — BHH Group Notes (Signed)
[facility] Group Notes: (Clinical Social Work)   09/07/2015      Type of Therapy:  Group Therapy   Participation Level:  Did Not Attend despite MHT prompting   Selmer Dominion, LCSW 09/07/2015, 12:25 PM

## 2015-09-07 NOTE — Progress Notes (Signed)
[facility] Group Notes:  (Nursing/MHT/Case Management/Adjunct)  Date:  09/07/2015  Time:  1:20 AM  Type of Therapy:  Psychoeducational Skills  Participation Level:  Active  Participation Quality:  Attentive  Affect:  Appropriate  Cognitive:  Appropriate  Insight:  Appropriate  Engagement in Group:  Developing/Improving  Modes of Intervention:  Education  Summary of Progress/Problems: The patient described his day as having been "all right" since he was able to make friends with a few of his peers. As for the theme of the day, his coping skill will be to listen to music.   Qais Jowers S 09/07/2015, 1:20 AM

## 2015-09-08 MED ORDER — LITHIUM CARBONATE 300 MG PO CAPS
300.0000 mg | ORAL_CAPSULE | Freq: Two times a day (BID) | ORAL | Status: DC
  Administered 2015-09-08 – 2015-09-09 (×2): 300 mg via ORAL
  Filled 2015-09-08 (×7): qty 1

## 2015-09-08 MED ORDER — OLANZAPINE 5 MG PO TBDP
5.0000 mg | ORAL_TABLET | Freq: Every day | ORAL | Status: DC
  Filled 2015-09-08 (×3): qty 1

## 2015-09-08 NOTE — Progress Notes (Signed)
Recreation Therapy Notes  Date: 02.06.2017 Time: 9:30am Location: [address] Group Room   Group Topic: Stress Management  Goal Area(s) Addresses:  Patient will actively participate in stress management techniques presented during session.   Behavioral Response: Did not attend.   Laureen Ochs Tyann Niehaus, LRT/CTRS        Kadrian Partch L 09/08/2015 3:24 PM

## 2015-09-08 NOTE — Plan of Care (Signed)
Problem: Alteration in thought process Goal: STG-Patient is able to follow short directions Outcome: Progressing Pt able to carry conversation and follow direction

## 2015-09-08 NOTE — Progress Notes (Signed)
Adult Psychoeducational Group Note  Date:  09/08/2015 Time:  8:38 PM  Group Topic/Focus:  Wrap-Up Group:   The focus of this group is to help patients review their daily goal of treatment and discuss progress on daily workbooks.  Participation Level:  Minimal  Participation Quality:  Attentive and Resistant  Affect:  Blunted and Resistant  Cognitive:  Appropriate  Insight: Limited  Engagement in Group:  Limited  Modes of Intervention:  Discussion  Additional Comments:  Pt was very short with his answers, however still pleasant. Pt stated his day was "all right".   Lincoln Brigham 09/08/2015, 9:04 PM

## 2015-09-08 NOTE — Progress Notes (Signed)
Patient ID: Justin Chaney, male   DOB: 12/19/1995, 20 y.o.   MRN: [id number]   Pt currently presents with a flat affect and guarded behavior. Pt mood is irritable today. Per self inventory, pt rates depression at a 5, hopelessness 6 and anxiety 0. Pt states that he does not have a daily goal because he is "just sleepy" at this time. Pt has remained asleep this morning until 1000. Pt reports good sleep, a good appetite, normal energy and good concentration.   Pt provided with medications per providers orders. Pt's labs and vitals were monitored throughout the day. Pt supported emotionally and encouraged to express concerns and questions. Pt educated on medications.   Pt's safety ensured with 15 minute and environmental checks. Pt currently denies SI/HI and visual hallucinations. Pt verbally agrees to seek staff if SI/HI or VH occurs and to consult with staff before acting on any harmful thoughts. Pt reports to writer "I always hear things but I'm not at this moment." Will continue POC.

## 2015-09-08 NOTE — Progress Notes (Signed)
Patient ID: Justin Chaney, male   DOB: 06-Feb-1996, 20 y.o.   MRN: [id number] D: Patient in dayroom playing cards with peers. Pt refused Zyprexa stating "it makes his legs stiff". Educated pt about the side effects of antipsychotic and the medication given to prevent it. Pt reported having "too many issues with Zyprexa and will not take it". Pt endorses AH with command to hurt people. Pt reports able to tune out voices. Pt denies SI/HI/VH and pain. Cooperative with assessment.   A: Support and encouragement provided to attend groups and engage in milieu. Pt encouraged to discuss feelings and come to staff with any question or concerns.  R: Patient remains safe.

## 2015-09-08 NOTE — BHH Group Notes (Signed)
[facility] LCSW Aftercare Discharge Planning Group Note  09/08/2015 8:45 AM  Pt did not attend, declined invitation.   [name], [place] 09/08/2015 9:32 AM

## 2015-09-08 NOTE — BHH Group Notes (Signed)
[facility] LCSW Group Therapy  09/08/2015 1:15pm  Type of Therapy:  Group Therapy vercoming Obstacles  Participation Level:  Minimal  Participation Quality:  Resistant, Disengaged  Affect:  Flat  Cognitive:  Appropriate and Oriented  Insight:  Unable to assess  Engagement in Therapy:  Limited  Modes of Intervention:  Discussion, Exploration, Problem-solving and Support  Description of Group:   In this group patients will be encouraged to explore what they see as obstacles to their own wellness and recovery. They will be guided to discuss their thoughts, feelings, and behaviors related to these obstacles. The group will process together ways to cope with barriers, with attention given to specific choices patients can make. Each patient will be challenged to identify changes they are motivated to make in order to overcome their obstacles. This group will be process-oriented, with patients participating in exploration of their own experiences as well as giving and receiving support and challenge from other group members.  Summary of Patient Progress: Pt did not participate in group therapy; however, he did make a resistant comment to other peers.    Therapeutic Modalities:   Cognitive Behavioral Therapy Solution Focused Therapy Motivational Interviewing Relapse Prevention Therapy   [name], LCSWA 09/08/2015 3:11 PM

## 2015-09-08 NOTE — Progress Notes (Addendum)
Patient ID: Justin Chaney, male   DOB: 29-Dec-1995, 20 y.o.   MRN: [id number] [facility] MD Progress Note  09/08/2015 3:25 PM Justin Chaney  MRN:  [id number] Subjective:  Patient reports he is still feeling depressed, but he acknowledges improvement compared to admission. He states he has no suicidal ideations at this time , contracts for safety on unit . He denies medication side effects.  Objective: I have discussed case with treatment team and have met with patient.  Patient presents calm, cooperative, fair eye contact.  Reports some ongoing depression, but reports feeling better. Describes history of  Psychiatric illness and states " I have been diagnosed with schizophrenia ". Chart notes indicate he initially presented disorganized in thought process, delusional, reporting hallucinations. At this time patient presents calm, thought process appears linear, and states " I hear voices all the time, but they are just conversations". Denies command hallucinations and at this time does not appear internally preoccupied . Tolerating medications well . Does not endorse side effects. Group attendance has been fair, but he has become more visible on the unit, and has been able to spend time in day room with peers . Behavior calm, in good control .   Principal Problem: Schizoaffective disorder, bipolar type ([place]) Diagnosis:   Patient Active Problem List   Diagnosis Date Noted  . Intermittent explosive disorder [F63.81] 09/05/2015  . Schizoaffective disorder, bipolar type ([place]) [F25.0] 09/04/2015  . Non compliance w medication regimen [Z91.14] 01/17/2015  . Substance induced mood disorder ([place]) [F19.94] 02/08/2014  . 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 Cannabis abuse [F12.10] 08/15/2012   Total Time spent with patient:  20 minutes  Past Psychiatric History: see above noted  Past Medical History:  Past Medical History  Diagnosis Date  . Asthma   . Seizures ([place])   . Brain  ventricular shunt obstruction     hydrochelpis  . Depression   . Bipolar disorder [facility])     Past Surgical History  Procedure Laterality Date  . Tonsillectomy    . Ventriculo-peritoneal shunt placement / laparoscopic insertion peritoneal catheter     Family History:  Family History  Problem Relation Age of Onset  . Hypertension Mother   . Mental illness Neg Hx    Family Psychiatric  History:  See above noted  Social History:  History  Alcohol Use  . 7.2 oz/week  . 12 Cans of beer per week    Comment: 4-8 40 oz.on the weekend     History  Drug Use  . Yes  . Special: Marijuana, Other-see comments, Amphetamines    Comment: Adderall, "I don't do weed, only rarely"    Social History   Social History  . Marital Status: Single    Spouse Name: N/A  . Number of Children: N/A  . Years of Education: N/A   Social History Main Topics  . Smoking status: Current Every Day Smoker -- 1.00 packs/day    Types: Cigarettes  . Smokeless tobacco: None  . Alcohol Use: 7.2 oz/week    12 Cans of beer per week     Comment: 4-8 40 oz.on the weekend  . Drug Use: Yes    Special: Marijuana, Other-see comments, Amphetamines     Comment: Adderall, "I don't do weed, only rarely"  . Sexual Activity: Not Asked   Other Topics Concern  . None   Social History Narrative   Additional Social History:   Sleep:  Improving   Appetite:   "OK"  Current Medications: Current Facility-Administered Medications  Medication Dose Route Frequency Provider Last Rate Last Dose  . acetaminophen (TYLENOL) tablet 650 mg  650 mg Oral Q6H PRN Delfin Gant, NP   650 mg at 09/04/15 2346  . alum & mag hydroxide-simeth (MAALOX/MYLANTA) 200-200-20 MG/5ML suspension 30 mL  30 mL Oral Q4H PRN Delfin Gant, NP      . guaiFENesin-dextromethorphan (ROBITUSSIN DM) 100-10 MG/5ML syrup 5 mL  5 mL Oral Q4H PRN Kathlee Nations, MD      . hydrOXYzine (ATARAX/VISTARIL) tablet 25 mg  25 mg Oral Q6H PRN Ursula Alert,  MD      . ibuprofen (ADVIL,MOTRIN) tablet 600 mg  600 mg Oral Q6H PRN Kathlee Nations, MD      . lithium carbonate capsule 150 mg  150 mg Oral BID WC Saramma Eappen, MD   150 mg at 09/08/15 1000  . magnesium hydroxide (MILK OF MAGNESIA) suspension 30 mL  30 mL Oral Daily PRN Delfin Gant, NP      . OLANZapine zydis (ZYPREXA) disintegrating tablet 5 mg  5 mg Oral Q6H PRN Ursula Alert, MD       Or  . OLANZapine (ZYPREXA) injection 5 mg  5 mg Intramuscular Q6H PRN Ursula Alert, MD        Lab Results:  No results found for this or any previous visit (from the past 48 hour(s)).  Physical Findings: AIMS: Facial and Oral Movements Muscles of Facial Expression: None, normal Lips and Perioral Area: None, normal Jaw: None, normal Tongue: None, normal,Extremity Movements Upper (arms, wrists, hands, fingers): None, normal Lower (legs, knees, ankles, toes): None, normal, Trunk Movements Neck, shoulders, hips: None, normal, Overall Severity Severity of abnormal movements (highest score from questions above): None, normal Incapacitation due to abnormal movements: None, normal Patient's awareness of abnormal movements (rate only patient's report): No Awareness, Dental Status Current problems with teeth and/or dentures?: No Does patient usually wear dentures?: No  CIWA:  CIWA-Ar Total: 2 COWS:     Musculoskeletal: Strength & Muscle Tone: within normal limits Gait & Station: normal Patient leans: N/A  Psychiatric Specialty Exam: Review of Systems  Psychiatric/Behavioral: Positive for depression and suicidal ideas. Negative for hallucinations. The patient is nervous/anxious and has insomnia.   All other systems reviewed and are negative.   Blood pressure 116/67, pulse 98, temperature 97.9 F (36.6 C), temperature source Oral, resp. rate 18, height '5\' 2"'$  (1.575 m), weight 222 lb (100.699 kg), SpO2 98 %.Body mass index is 40.59 kg/(m^2).   General Appearance:  Fairly groomed   Chemical engineer:: Good  Speech:  Normal   Volume:  Low   Mood: reports mood is improved   Affect: blunted, not irritable at this time  Thought Process:presents linear at this time   Orientation: Full (Time, Place, and Person)  Thought Content: reports chronic auditory hallucinations which he describes as conversations . Denies command hallucinations at this time and does not present visibly internally preoccupied .   Suicidal Thoughts: No, at this time denies suicidal plan or intention and contracts for safety on unit   Homicidal Thoughts: No  Memory: recent and remote grossly intact   Judgement: Fair  Insight: Fair  Psychomotor Activity: decreased   Concentration:good   Recall:  Good   Fund of Knowledge: good   Language: Good  Akathisia: Negative  Handed: Right  AIMS (if indicated):    Assets: Resilience  ADL's: Intact  Cognition: WNL  Sleep: Number of Hours: 6  Assessment - 20 year old male, history of psychiatric illness, has been diagnosed with Schizoaffective Disorder, who  Presents with gradual improvement compared to admission- reports improving mood, denies SI, and describes chronic auditory hallucinations, which have subsided - at this time does not appear internally preoccupied . Thus far tolerating medications well, including lithium trial .   Treatment Plan Summary: Encourage increased  group, milieu participation to work on coping skills and symptom reduction  Increase Lithium to 300 mgrs BID, for mood disorder, history of suicidal ideations. Rationale for increase is that he is tolerating current low dose well, without side effects  Start standing Zyprexa 5 mgrs QHS for mood disorder, psychosis  Continue Trazodone 50 mg QHS PRN  for sleep, as needed  Continue Hydroxyzine 25 mgrs Q 6 hours PRN for anxiety as needed . HgbA1c, Lipid Panel, Prolactin ordered , as on Zyprexa .    Neita Garnet, MD 09/08/2015, 3:25 PM

## 2015-09-09 LAB — LIPID PANEL
Cholesterol: 133 mg/dL (ref 0–200)
HDL: 35 mg/dL — ABNORMAL LOW (ref >40)
LDL Cholesterol: 84 mg/dL (ref 0–99)
Total CHOL/HDL Ratio: 3.8 RATIO
Triglycerides: 70 mg/dL (ref <150)
VLDL: 14 mg/dL (ref 0–40)

## 2015-09-09 LAB — LITHIUM LEVEL: Lithium Lvl: 0.06 mmol/L — ABNORMAL LOW (ref 0.60–1.20)

## 2015-09-09 MED ORDER — LITHIUM CARBONATE 300 MG PO CAPS
300.0000 mg | ORAL_CAPSULE | Freq: Every morning | ORAL | Status: DC
  Filled 2015-09-09 (×2): qty 1

## 2015-09-09 MED ORDER — ARIPIPRAZOLE 5 MG PO TABS
5.0000 mg | ORAL_TABLET | Freq: Every day | ORAL | Status: DC
  Administered 2015-09-09 – 2015-09-11 (×3): 5 mg via ORAL
  Filled 2015-09-09 (×8): qty 1

## 2015-09-09 MED ORDER — LITHIUM CARBONATE 300 MG PO CAPS
600.0000 mg | ORAL_CAPSULE | Freq: Every day | ORAL | Status: DC
  Administered 2015-09-09 – 2015-09-12 (×4): 600 mg via ORAL
  Filled 2015-09-09 (×6): qty 2

## 2015-09-09 NOTE — Progress Notes (Signed)
Adult Psychoeducational Group Note  Date:  09/09/2015 Time:  0900 am  Group Topic/Focus:  Orientation:   The focus of this group is to educate the patient on the purpose and policies of crisis stabilization and provide a format to answer questions about their admission.  The group details unit policies and expectations of patients while admitted.  Participation Level:  Minimal  Participation Quality:  Inattentive  Affect:  Flat  Cognitive:  Lacking  Insight: Lacking  Engagement in Group:  Poor  Modes of Intervention:  Orientation  Additional Comments:  Pt slept during group.  Antoniette Peake L 09/09/2015, 12:45 PM

## 2015-09-09 NOTE — Progress Notes (Signed)
Adult Psychoeducational Group Note  Date:  09/09/2015 Time:  8:30 PM  Group Topic/Focus:  Wrap-Up Group:   The focus of this group is to help patients review their daily goal of treatment and discuss progress on daily workbooks.  Participation Level:  Active  Participation Quality:  Appropriate  Affect:  Appropriate  Cognitive:  Appropriate  Insight: Appropriate and Improving  Engagement in Group:  Engaged  Modes of Intervention:  Discussion  Additional Comments:  Pt was pleasant during wrap-up group. Pt participated much more than he did yesterday. Pt rated his overall day a 7 out of 10 because "I isolated myself when I was having some flashbacks earlier". Pt reported that he achieved his goal for the day, which was to socialize more.   Lincoln Brigham 09/09/2015, 9:21 PM

## 2015-09-09 NOTE — Progress Notes (Signed)
D: Pt presents with flat affect and depressed mood. Pt endorses passive suicidal thoughts with no plan. Pt denies active suicidal thoughts at this time. Pt verbally contracts for safety. Pt denies AVH this morning. Pt rates depression 6/10. Pt reports good sleep, appetite and concentration. Pt continues to refuse to take Zyprexa, complaining that it causes his legs to tighten up.  A: Medications reviewed with pt. Medications administered as ordered per MD. Verbal support given. Pt encouraged to attend groups. 15 minute checks performed for safety.  R: Pt verbalized understanding of med regimen. Pt receptive to tx.

## 2015-09-09 NOTE — Progress Notes (Signed)
Patient ID: Justin Chaney, male   DOB: [birth year], 20 y.o.   MRN: [id number] [facility] MD Progress Note  09/09/2015 4:17 PM Justin Chaney  MRN:  [id number] Subjective:  Patient states " I am feeling a little better, but I don't think I am ready to be discharged yet". Reports chronic auditory hallucinations, sometimes telling him to hurt people. States " I have been hearing voices since I was a kid, right now they are better than before I came in." States he has not had any hallucinations since this AM and states hallucinations are not severe currently and easy to ignore . Of note he has refused to take Zyprexa- states that this medication causes his " bones to hurt, get stiff ". We discussed other options- he states Abilify has helped and has been well tolerated, without side effects, and that he would be willing to take Abilify. Thus far tolerating Lithium trial well , denies side effects.   Objective: I have discussed case with treatment team and have met with patient.  Patient presents calm, cooperative, fair eye contact.  Remains vaguely irritable, but improved compared to yesterday. No disruptive or agitated behaviors on unit. Improved milieu participation- going to some groups, and noted to be interacting more, playing cards with peers . As noted, reports partially improved mood and decreasing frequency and intensity of hallucinations. At this time denies medication side effects- we have reviewed lithium side effects/ symptoms of lithium toxicity. Currently no nausea, no tremors, gait steady. No EPS , no akathisia .  Lithium serum level low ( 0.06 )  Lipid panel unremarkable except for low  HDL ( 35)  Principal Problem: Schizoaffective disorder, bipolar type ([place]) Diagnosis:   Patient Active Problem List   Diagnosis Date Noted  . Intermittent explosive disorder [F63.81] 09/05/2015  . Schizoaffective disorder, bipolar type ([place]) [F25.0] 09/04/2015  . Non compliance w medication regimen  [Z91.14] 01/17/2015  . Substance induced mood disorder ([place]) [F19.94] 02/08/2014  . 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 Cannabis abuse [F12.10] 08/15/2012   Total Time spent with patient:  20 minutes  Past Psychiatric History: see above noted  Past Medical History:  Past Medical History  Diagnosis Date  . Asthma   . Seizures ([place])   . Brain ventricular shunt obstruction     hydrochelpis  . Depression   . Bipolar disorder [facility])     Past Surgical History  Procedure Laterality Date  . Tonsillectomy    . Ventriculo-peritoneal shunt placement / laparoscopic insertion peritoneal catheter     Family History:  Family History  Problem Relation Age of Onset  . Hypertension Mother   . Mental illness Neg Hx    Family Psychiatric  History:  See above noted  Social History:  History  Alcohol Use  . 7.2 oz/week  . 12 Cans of beer per week    Comment: 4-8 40 oz.on the weekend     History  Drug Use  . Yes  . Special: Marijuana, Other-see comments, Amphetamines    Comment: Adderall, "I don't do weed, only rarely"    Social History   Social History  . Marital Status: Single    Spouse Name: N/A  . Number of Children: N/A  . Years of Education: N/A   Social History Main Topics  . Smoking status: Current Every Day Smoker -- 1.00 packs/day    Types: Cigarettes  . Smokeless tobacco: None  . Alcohol Use: 7.2 oz/week    12 Cans of  beer per week     Comment: 4-8 40 oz.on the weekend  . Drug Use: Yes    Special: Marijuana, Other-see comments, Amphetamines     Comment: Adderall, "I don't do weed, only rarely"  . Sexual Activity: Not Asked   Other Topics Concern  . None   Social History Narrative   Additional Social History:   Sleep:  Improving   Appetite:   "OK"  Current Medications: Current Facility-Administered Medications  Medication Dose Route Frequency Provider Last Rate Last Dose  . acetaminophen (TYLENOL) tablet 650 mg  650 mg Oral Q6H  PRN Delfin Gant, NP   650 mg at 09/04/15 2346  . alum & mag hydroxide-simeth (MAALOX/MYLANTA) 200-200-20 MG/5ML suspension 30 mL  30 mL Oral Q4H PRN Delfin Gant, NP      . ARIPiprazole (ABILIFY) tablet 5 mg  5 mg Oral Daily Myer Peer Natilee Gauer, MD      . guaiFENesin-dextromethorphan (ROBITUSSIN DM) 100-10 MG/5ML syrup 5 mL  5 mL Oral Q4H PRN Kathlee Nations, MD      . hydrOXYzine (ATARAX/VISTARIL) tablet 25 mg  25 mg Oral Q6H PRN Ursula Alert, MD      . ibuprofen (ADVIL,MOTRIN) tablet 600 mg  600 mg Oral Q6H PRN Kathlee Nations, MD      . Derrill Memo ON 09/10/2015] lithium carbonate capsule 300 mg  300 mg Oral q morning - 10a Myer Peer Laci Frenkel, MD      . lithium carbonate capsule 600 mg  600 mg Oral QHS Beverley Sherrard A Loda Bialas, MD      . magnesium hydroxide (MILK OF MAGNESIA) suspension 30 mL  30 mL Oral Daily PRN Delfin Gant, NP      . OLANZapine zydis (ZYPREXA) disintegrating tablet 5 mg  5 mg Oral Q6H PRN Ursula Alert, MD       Or  . OLANZapine (ZYPREXA) injection 5 mg  5 mg Intramuscular Q6H PRN Ursula Alert, MD        Lab Results:  Results for orders placed or performed during the hospital encounter of 09/04/15 (from the past 48 hour(s))  Lithium level     Status: Abnormal   Collection Time: 09/09/15  6:55 AM  Result Value Ref Range   Lithium Lvl 0.06 (L) 0.60 - 1.20 mmol/L    Comment: REPEATED TO VERIFY Performed at [facility]   Lipid panel     Status: Abnormal   Collection Time: 09/09/15  6:55 AM  Result Value Ref Range   Cholesterol 133 0 - 200 mg/dL   Triglycerides 70 <150 mg/dL   HDL 35 (L) >40 mg/dL   Total CHOL/HDL Ratio 3.8 RATIO   VLDL 14 0 - 40 mg/dL   LDL Cholesterol 84 0 - 99 mg/dL    Comment:        Total Cholesterol/HDL:CHD Risk Coronary Heart Disease Risk Table                     Men   Women  1/2 Average Risk   3.4   3.3  Average Risk       5.0   4.4  2 X Average Risk   9.6   7.1  3 X Average Risk  23.4   11.0        Use the  calculated Patient Ratio above and the CHD Risk Table to determine the patient's CHD Risk.        ATP III CLASSIFICATION (LDL):  <  100     mg/dL   Optimal  100-129  mg/dL   Near or Above                    Optimal  130-159  mg/dL   Borderline  160-189  mg/dL   High  >190     mg/dL   Very High Performed at [facility]     Physical Findings: AIMS: Facial and Oral Movements Muscles of Facial Expression: None, normal Lips and Perioral Area: None, normal Jaw: None, normal Tongue: None, normal,Extremity Movements Upper (arms, wrists, hands, fingers): None, normal Lower (legs, knees, ankles, toes): None, normal, Trunk Movements Neck, shoulders, hips: None, normal, Overall Severity Severity of abnormal movements (highest score from questions above): None, normal Incapacitation due to abnormal movements: None, normal Patient's awareness of abnormal movements (rate only patient's report): No Awareness, Dental Status Current problems with teeth and/or dentures?: No Does patient usually wear dentures?: No  CIWA:  CIWA-Ar Total: 2 COWS:     Musculoskeletal: Strength & Muscle Tone: within normal limits Gait & Station: normal Patient leans: N/A  Psychiatric Specialty Exam: Review of Systems  Psychiatric/Behavioral: Positive for depression and suicidal ideas. Negative for hallucinations. The patient is nervous/anxious and has insomnia.   All other systems reviewed and are negative.   Blood pressure 123/73, pulse 97, temperature 97.7 F (36.5 C), temperature source Oral, resp. rate 16, height _0  (1.575 m), weight 222 lb (100.699 kg), SpO2 98 %.Body mass index is 40.59 kg/(m^2).   General Appearance:   Improved grooming    Eye Contact:: Good  Speech:  Normal   Volume:  Low   Mood: improving , vaguely depressed   Affect:  Less irritable today  Thought Process:presents linear at this time   Orientation: Full (Time, Place, and Person)  Thought Content: reports  chronic auditory hallucinations, but states decreasing in frequency and intensity , at this time not internally preoccupied   Suicidal Thoughts: No, at this time denies suicidal plan or intention and contracts for safety on unit   Homicidal Thoughts: No  Memory: recent and remote grossly intact   Judgement:  Improving   Insight:  Improving   Psychomotor Activity:  Improving   Concentration:good   Recall:  Good   Fund of Knowledge: good   Language: Good  Akathisia: Negative  Handed: Right  AIMS (if indicated):    Assets: Resilience  ADL's: Intact  Cognition: WNL  Sleep: Number of Hours: 6        Assessment -  Patient improving partially compared to admission presentation- mood improving , less irritability, behavior calm and in good control, interacting more with peers and more visible on unit, tolerating Lithium well , but lithium serum level below therapeutic. Does not want to take Zyprexa, due to concern about side effects, but states Abilify has been well tolerated and is willing to take it . We have reviewed side effect profile for medications, to include lithium.  Treatment Plan Summary: Encourage increased  group, milieu participation to work on coping skills and symptom reduction  Increase Lithium to 300 mgrs QAM and 600 mgrs QHS , for mood disorder, history of suicidal ideations. Rationale for increase is low serum level  D/C Zyprexa- see above. Start Abilify 5 mgrs QDAY for mood disorder  Continue Trazodone 50 mg QHS PRN  for sleep, as needed  Continue Hydroxyzine 25 mgrs Q 6 hours PRN for anxiety as needed .    Neita Garnet, MD  09/09/2015, 4:17 PM

## 2015-09-09 NOTE — Progress Notes (Signed)
Patient ID: Justin Chaney, male   DOB: 10-19-1995, 20 y.o.   MRN: [id number] D: Patient reports doing well glad lithium dose has increase. Pt interacting well with peers. Pt denies SI/HI/VH and pain. Pt endorses AH without command. Cooperative with assessment.   A: Support and encouragement provided to attend groups and engage in milieu. Pt encouraged to discuss feelings and come to staff with any question or concerns.  R: Patient remains safe.

## 2015-09-09 NOTE — BHH Group Notes (Signed)
[facility] LCSW Group Therapy 09/09/2015 1:15 PM  Type of Therapy: Group Therapy- Feelings about Diagnosis  Pt did not attend, declined invitation.   [name], [place] 09/09/2015 3:46 PM

## 2015-09-09 NOTE — Progress Notes (Signed)
Recreation Therapy Notes  Animal-Assisted Activity (AAA) Program Checklist/Progress Notes Patient Eligibility Criteria Checklist & Daily Group note for Rec Tx Intervention  Date: 02.07.2017 Time: 2:45pm Location: [address]    AAA/T Program Assumption of Risk Form signed by Patient/ or Parent Legal Guardian yes  Patient is free of allergies or sever asthma yes  Patient reports no fear of animals yes  Patient reports no history of cruelty to animals yes  Patient understands his/her participation is voluntary yes  Behavioral Response: Did not attend.   Laureen Ochs Janiesha Diehl, LRT/CTRS  Malania Gawthrop L 09/09/2015 3:10 PM

## 2015-09-10 LAB — PROLACTIN: Prolactin: 8 ng/mL (ref 4.0–15.2)

## 2015-09-10 LAB — HEMOGLOBIN A1C
Hgb A1c MFr Bld: 5 % (ref 4.8–5.6)
Mean Plasma Glucose: 97 mg/dL

## 2015-09-10 MED ORDER — ONDANSETRON 4 MG PO TBDP
4.0000 mg | ORAL_TABLET | Freq: Once | ORAL | Status: AC
  Administered 2015-09-10: 4 mg via ORAL
  Filled 2015-09-10 (×2): qty 1

## 2015-09-10 MED ORDER — LITHIUM CARBONATE 300 MG PO CAPS
300.0000 mg | ORAL_CAPSULE | Freq: Every day | ORAL | Status: DC
  Administered 2015-09-10 – 2015-09-13 (×4): 300 mg via ORAL
  Filled 2015-09-10 (×7): qty 1

## 2015-09-10 NOTE — Progress Notes (Signed)
Patient ID: Justin Chaney, male   DOB: [birth year], 20 y.o.   MRN: [id number] [facility] MD Progress Note  09/10/2015 5:33 PM Justin Chaney  MRN:  [id number] Subjective:   Earlier today patient reported feeling worse, more depressed. Later in the day vomited x 1. At this time reports " I am better now, I was just feeling kind of sick like something I ate did not agree with me, but now I am OK". He does not think Abilify/ lithium  is causing side effects or was cause of vomiting .   Objective: I have discussed case with treatment team and have met with patient.  Earlier , in AM, patient presented vaguely irritable, angry, and initially refused medications, which he did take later with encouragement . As noted, at this time states he is feeling better, and states he was feeling vaguely ill and nauseous this morning, contributing to his irritability. No further nausea at this time, and states appetite is good and is looking forward to dinner. As noted, does not think these symptoms were related to Lithium or Abilify trials . At this time visible on unit , cooperative and superficially pleasant on approach. Patient reports chronic hallucinations, which have subsided , at this time not internally preoccupied . Of note, patient has history of Brain ventricular shunting done as child for hydrocephalus- he has not followed up recently- we have reviewed importance of following up regularly with neurologist for monitoring .    Principal Problem: Schizoaffective disorder, bipolar type ([place]) Diagnosis:   Patient Active Problem List   Diagnosis Date Noted  . Intermittent explosive disorder [F63.81] 09/05/2015  . Schizoaffective disorder, bipolar type ([place]) [F25.0] 09/04/2015  . Non compliance w medication regimen [Z91.14] 01/17/2015  . Substance induced mood disorder ([place]) [F19.94] 02/08/2014  . 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 Cannabis abuse [F12.10] 08/15/2012   Total Time  spent with patient:  20 minutes  Past Psychiatric History: see above noted  Past Medical History:  Past Medical History  Diagnosis Date  . Asthma   . Seizures ([place])   . Brain ventricular shunt obstruction     hydrochelpis  . Depression   . Bipolar disorder [facility])     Past Surgical History  Procedure Laterality Date  . Tonsillectomy    . Ventriculo-peritoneal shunt placement / laparoscopic insertion peritoneal catheter     Family History:  Family History  Problem Relation Age of Onset  . Hypertension Mother   . Mental illness Neg Hx    Family Psychiatric  History:  See above noted  Social History:  History  Alcohol Use  . 7.2 oz/week  . 12 Cans of beer per week    Comment: 4-8 40 oz.on the weekend     History  Drug Use  . Yes  . Special: Marijuana, Other-see comments, Amphetamines    Comment: Adderall, "I don't do weed, only rarely"    Social History   Social History  . Marital Status: Single    Spouse Name: N/A  . Number of Children: N/A  . Years of Education: N/A   Social History Main Topics  . Smoking status: Current Every Day Smoker -- 1.00 packs/day    Types: Cigarettes  . Smokeless tobacco: None  . Alcohol Use: 7.2 oz/week    12 Cans of beer per week     Comment: 4-8 40 oz.on the weekend  . Drug Use: Yes    Special: Marijuana, Other-see comments, Amphetamines  Comment: Adderall, "I don't do weed, only rarely"  . Sexual Activity: Not Asked   Other Topics Concern  . None   Social History Narrative   Additional Social History:   Sleep:  Improving   Appetite:   "OK"  Current Medications: Current Facility-Administered Medications  Medication Dose Route Frequency Provider Last Rate Last Dose  . acetaminophen (TYLENOL) tablet 650 mg  650 mg Oral Q6H PRN Delfin Gant, NP   650 mg at 09/04/15 2346  . alum & mag hydroxide-simeth (MAALOX/MYLANTA) 200-200-20 MG/5ML suspension 30 mL  30 mL Oral Q4H PRN Delfin Gant, NP      .  ARIPiprazole (ABILIFY) tablet 5 mg  5 mg Oral Daily Myer Peer Alvey Brockel, MD   5 mg at 09/10/15 1004  . guaiFENesin-dextromethorphan (ROBITUSSIN DM) 100-10 MG/5ML syrup 5 mL  5 mL Oral Q4H PRN Kathlee Nations, MD      . hydrOXYzine (ATARAX/VISTARIL) tablet 25 mg  25 mg Oral Q6H PRN Ursula Alert, MD      . ibuprofen (ADVIL,MOTRIN) tablet 600 mg  600 mg Oral Q6H PRN Kathlee Nations, MD      . lithium carbonate capsule 300 mg  300 mg Oral Daily Saramma Eappen, MD   300 mg at 09/10/15 1004  . lithium carbonate capsule 600 mg  600 mg Oral QHS Jenne Campus, MD   600 mg at 09/09/15 2114  . magnesium hydroxide (MILK OF MAGNESIA) suspension 30 mL  30 mL Oral Daily PRN Delfin Gant, NP      . OLANZapine zydis (ZYPREXA) disintegrating tablet 5 mg  5 mg Oral Q6H PRN Ursula Alert, MD       Or  . OLANZapine (ZYPREXA) injection 5 mg  5 mg Intramuscular Q6H PRN Ursula Alert, MD        Lab Results:  Results for orders placed or performed during the hospital encounter of 09/04/15 (from the past 48 hour(s))  Lithium level     Status: Abnormal   Collection Time: 09/09/15  6:55 AM  Result Value Ref Range   Lithium Lvl 0.06 (L) 0.60 - 1.20 mmol/L    Comment: REPEATED TO VERIFY Performed at [facility]   Hemoglobin A1c     Status: None   Collection Time: 09/09/15  6:55 AM  Result Value Ref Range   Hgb A1c MFr Bld 5.0 4.8 - 5.6 %    Comment: (NOTE)         Pre-diabetes: 5.7 - 6.4         Diabetes: >6.4         Glycemic control for adults with diabetes: <7.0    Mean Plasma Glucose 97 mg/dL    Comment: (NOTE) Performed At: [facility] [place], [place] [id number] [name] MD [id number] Performed at [facility]   Lipid panel     Status: Abnormal   Collection Time: 09/09/15  6:55 AM  Result Value Ref Range   Cholesterol 133 0 - 200 mg/dL   Triglycerides 70 <150 mg/dL   HDL 35 (L) >40 mg/dL   Total CHOL/HDL Ratio 3.8 RATIO    VLDL 14 0 - 40 mg/dL   LDL Cholesterol 84 0 - 99 mg/dL    Comment:        Total Cholesterol/HDL:CHD Risk Coronary Heart Disease Risk Table                     Men  Women  1/2 Average Risk   3.4   3.3  Average Risk       5.0   4.4  2 X Average Risk   9.6   7.1  3 X Average Risk  23.4   11.0        Use the calculated Patient Ratio above and the CHD Risk Table to determine the patient's CHD Risk.        ATP III CLASSIFICATION (LDL):  <100     mg/dL   Optimal  100-129  mg/dL   Near or Above                    Optimal  130-159  mg/dL   Borderline  160-189  mg/dL   High  >190     mg/dL   Very High Performed at [facility]   Prolactin     Status: None   Collection Time: 09/09/15  6:55 AM  Result Value Ref Range   Prolactin 8.0 4.0 - 15.2 ng/mL    Comment: (NOTE) Performed At: [facility] [place], [place] [id number] [name] MD KG:[id number] Performed at [facility]     Physical Findings: AIMS: Facial and Oral Movements Muscles of Facial Expression: None, normal Lips and Perioral Area: None, normal Jaw: None, normal Tongue: None, normal,Extremity Movements Upper (arms, wrists, hands, fingers): None, normal Lower (legs, knees, ankles, toes): None, normal, Trunk Movements Neck, shoulders, hips: None, normal, Overall Severity Severity of abnormal movements (highest score from questions above): None, normal Incapacitation due to abnormal movements: None, normal Patient's awareness of abnormal movements (rate only patient's report): No Awareness, Dental Status Current problems with teeth and/or dentures?: No Does patient usually wear dentures?: No  CIWA:  CIWA-Ar Total: 2 COWS:     Musculoskeletal: Strength & Muscle Tone: within normal limits Gait & Station: normal Patient leans: N/A  Psychiatric Specialty Exam: Review of Systems  Psychiatric/Behavioral: Positive for depression and suicidal ideas. Negative  for hallucinations. The patient is nervous/anxious and has insomnia.   All other systems reviewed and are negative. nausea and vomiting x 1 this AM, now resolved- no headache, no visual changes , no fever.   Blood pressure 110/73, pulse 103, temperature 98.9 F (37.2 C), temperature source Oral, resp. rate 16, height _0  (1.575 m), weight 222 lb (100.699 kg), SpO2 96 %.Body mass index is 40.59 kg/(m^2).   General Appearance:   Improved grooming    Eye Contact:: Good  Speech:  Normal   Volume:  Low   Mood: improved at this time   Affect:  Labile, less irritable in the afternoon   Thought Process:presents linear at this time   Orientation: Full (Time, Place, and Person)  Thought Content: reports chronic auditory hallucinations, but states decreasing in frequency and intensity , at this time not internally preoccupied   Suicidal Thoughts: No, at this time denies suicidal plan or intention and contracts for safety on unit   Homicidal Thoughts: No  Memory: recent and remote grossly intact   Judgement:  Improving   Insight:  Improving   Psychomotor Activity:  Improving   Concentration:good   Recall:  Good   Fund of Knowledge: good   Language: Good  Akathisia: Negative  Handed: Right  AIMS (if indicated):    Assets: Resilience  ADL's: Intact  Cognition: WNL  Sleep: Number of Hours: 6        Assessment -  Patient currently improved- earlier  today had presented with increased irritability, dysphoria. At this time he is feeling better and presents with more reactive affect. Reports he felt nauseous and vomited x 1 , after which he felt better. Does not think this was related to Lithium or Abilify but rather to something he ate . At this time no side effects reported .   Treatment Plan Summary: Encourage increased  group, milieu participation to work on coping skills and symptom reduction  Continue  Lithium to 300 mgrs QAM and 600 mgrs QHS , for mood  disorder, history of suicidal ideations.  Continue Abilify 5 mgrs QDAY for mood disorder  Continue Trazodone 50 mg QHS PRN  for sleep, as needed  Continue Hydroxyzine 25 mgrs Q 6 hours PRN for anxiety as needed . Treatment team working on disposition options, working on ACT team referral    Neita Garnet, MD 09/10/2015, 5:33 PM

## 2015-09-10 NOTE — BHH Group Notes (Signed)
[facility] LCSW Aftercare Discharge Planning Group Note  09/10/2015 8:45 AM  Pt did not attend, declined invitation.   Justin Chaney, [place] 09/10/2015 9:20 AM

## 2015-09-10 NOTE — Progress Notes (Signed)
Adult Psychoeducational Group Note  Date:  09/10/2015 Time:  8:26 PM  Group Topic/Focus:  Wrap-Up Group:   The focus of this group is to help patients review their daily goal of treatment and discuss progress on daily workbooks.  Participation Level:  Did Not Attend  Pt appeared to be asleep during wrap-up group.    Justin Chaney 09/10/2015, 8:55 PM

## 2015-09-10 NOTE — Progress Notes (Signed)
D: Pt irritable on approach and easily agitated this morning. Pt initially refused to take Abilify this morning. Pt wanted to be left alone. Pt eventually took Abilify and Lithium and 10:00 am. Pt endorses passive suicidal thoughts with no plan. Pt unsure if he can contract for safety. Dr. Parke Poisson made aware and pt assessed by MD. Pt endorses intermittent AVH. No active hallucinations verbalized by pt this morning. Pt appears sad, withdrawn and disheveled this morning. Pt have minimal interaction on the unit today and forwards little information.  A: Medications reviewed with pt. Medications administered as ordered per MD. Pt encouraged to comply with med regimen. Pt encouraged to attend groups. Verbal support provided. 15 minute checks performed for safety.  R: Pt did not verbalized understanding of med regimen. Writer will reinforce education at a later time today. Pt remains irritable and agitated.

## 2015-09-10 NOTE — Progress Notes (Signed)
Recreation Therapy Notes  Date: 02.08.2017  Time: 9:30am Location: [address] Group Room   Group Topic: Stress Management  Goal Area(s) Addresses:  Patient will actively participate in stress management techniques presented during session.   Behavioral Response: Did not attend.   Laureen Ochs [place], LRT/CTRS        Vonetta Foulk L 09/10/2015 4:05 PM

## 2015-09-10 NOTE — Progress Notes (Addendum)
Pt vomited at 1530 while downstairs at the courtyard. Pt denies feeling dizzy. Pt denies pain. Pt do not appear to be in distress. Pt asked to remain outdoors for rec therapy. Dr. Parke Poisson made aware. Verbal order for Zofran ordered per MD. Zofran given and v/s assessed.

## 2015-09-10 NOTE — Plan of Care (Signed)
Problem: Diagnosis: Increased Risk For Suicide Attempt Goal: LTG-Patient Will Show Positive Response to Medication LTG (by discharge) : Patient will show positive response to medication and will participate in the development of the discharge plan.  Outcome: Progressing Pt compliant with taking meds. No adverse reactions to meds verbalized.  Goal: STG-Patient Will Attend All Groups On The Unit Outcome: Progressing Pt compliant with attending groups.

## 2015-09-10 NOTE — BHH Group Notes (Signed)
[place] LCSW Group Therapy 09/10/2015 1:15 PM  Type of Therapy: Group Therapy- Emotion Regulation  Pt slept throughout group session.   Justin Chaney, [place] 09/10/2015 4:33 PM

## 2015-09-10 NOTE — Tx Team (Signed)
Interdisciplinary Treatment Plan Update (Adult)  Date:  09/10/2015   Time Reviewed:  9:31 AM   Progress in Treatment: Attending groups: Minimally Participating in groups:  No Taking medication as prescribed:  Yes. Tolerating medication:  Yes. Family/Significant other contact made:  No Patient understands diagnosis: No  Limited insight Discussing patient identified problems/goals with staff:  Yes, see initial care plan. Medical problems stabilized or resolved:  Yes. Denies suicidal/homicidal ideation: Yes. Issues/concerns per patient self-inventory:  No. Other:  New problem(s) identified: None identified at this time.   Discharge Plan or Barriers: see below  Reason for Continuation of Hospitalization: Delusions  Depression Medication stabilization Other; describe Paranoia  Comments:  Pt brought in voluntarily by [employer] d/t SI/HI, also pt thinks he is the anti-christ and will rule the world for a thousand years.   Estimated length of stay: 2-3 days  New goal(s):  Review of initial/current patient goals per problem list:   Review of initial/current patient goals per problem list:  1. Goal(s): Patient will participate in aftercare plan   Met: Yes   Target date: 3-5 days post admission date   As evidenced by: Patient will participate within aftercare plan AEB aftercare provider and housing plan at discharge being identified. 09/05/15:  Plan on returning home, following up outpt 09/10/15: Plans to return home and follow-up with [facility]   2. Goal (s): Patient will exhibit decreased depressive symptoms and suicidal ideations.   Met: No   Target date: 3-5 days post admission date   As evidenced by: Patient will utilize self rating of depression at 3 or below and demonstrate decreased signs of depression or be deemed stable for discharge by MD. 09/05/15:  Although he refuses to rate his depression, Alrick presents as grim, hopeless, helpless. 09/10/15: Pt rates depression at  6/10, endorses SI      5. Goal(s): Patient will demonstrate decreased signs of psychosis  * Met: No  * Target date: 3-5 days post admission date  * As evidenced by: Patient will demonstrate decreased frequency of AVH or return to baseline function 09/05/15:  Pt presents with paranoia, disorganization, delusions.  Has not been taking meds. 09/10/15: Pt continues to endorse AVH.            Attendees: Patient:  09/10/2015 9:31 AM   Family:   09/10/2015 9:31 AM   Physician:  Neita Garnet, MD 09/10/2015 9:31 AM   Nursing:   Grayland Ormond, RN; Darrol Angel, RN 09/10/2015 9:31 AM   CSW:   [name], LCSW  A 09/10/2015 9:31 AM   Other:  09/10/2015 9:31 AM   Other:   09/10/2015 9:31 AM   Other:  Lars Pinks, Nurse CM 09/10/2015 9:31 AM   Other:   09/10/2015 9:31 AM   Other:  Norberto Sorenson, [place]  09/10/2015 9:31 AM   Other:  09/10/2015 9:31 AM   Other:  09/10/2015 9:31 AM   Other:  09/10/2015 9:31 AM   Other:  09/10/2015 9:31 AM   Other:  09/10/2015 9:31 AM   Other:   09/10/2015 9:31 AM    Scribe for Treatment Team:   [name], [place] Work [phone]

## 2015-09-11 MED ORDER — ARIPIPRAZOLE 10 MG PO TABS
10.0000 mg | ORAL_TABLET | Freq: Every day | ORAL | Status: DC
  Administered 2015-09-11 – 2015-09-12 (×2): 10 mg via ORAL
  Filled 2015-09-11 (×5): qty 1

## 2015-09-11 MED ORDER — ARIPIPRAZOLE 10 MG PO TABS
10.0000 mg | ORAL_TABLET | Freq: Every day | ORAL | Status: DC
  Filled 2015-09-11: qty 1

## 2015-09-11 NOTE — Progress Notes (Signed)
Adult Psychoeducational Group Note  Date:  09/11/2015 Time:  9:14 PM  Group Topic/Focus:  Wrap-Up Group:   The focus of this group is to help patients review their daily goal of treatment and discuss progress on daily workbooks.  Participation Level:  Active  Participation Quality:  Appropriate  Affect:  Appropriate  Cognitive:  Appropriate  Insight: Good  Engagement in Group:  Engaged  Modes of Intervention:  Problem-solving  Additional Comments:  Justin Chaney shared with group on today that today was a pretty good day.  He stated he is expecting to have a better day on tomorrow.    [name] [place] 09/11/2015, 9:14 PM

## 2015-09-11 NOTE — Progress Notes (Signed)
Patient ID: Justin Chaney, male   DOB: 1995-08-12, 20 y.o.   MRN: [id number] [facility] MD Progress Note  09/11/2015 3:28 PM Lavarr President  MRN:  [id number] Subjective:  Patient reports " I'm OK". He does state he does not feel ready for discharge. Presents with some irritability, and states " I just know myself and I know I am not ready yet ". At this time denies medication side effects- now on Abilify trial.   Objective: I have discussed case with treatment team and have met with patient.  Patient remains somewhat irritable, angry, although  presents calm, not loud or threatening.  Compared to yesterday still dysphoric, but improved, and more visible on unit today. When this explored tends to become more irritable, making statement " I don't want to talk about it".  Patient denies medication side effects. No akathisia or abnormal movements noted. Compared to admission patient improved, better groomed, more organized/ linear  thought process.  He does continue to have psychotic symptoms and today stated " I know you guys don't believe this but I am the AntiChrist ".  Has ongoing intermittent hallucinations,although they have decreased and does not appear internally preoccupied at this time. Presents more future oriented- states " I need a couple more days and then I think I will be ready to discharge".  More responsive to support, empathy today- affect , initially irritable, improved partially during session.     Principal Problem: Schizoaffective disorder, bipolar type ([place]) Diagnosis:   Patient Active Problem List   Diagnosis Date Noted  . Intermittent explosive disorder [F63.81] 09/05/2015  . Schizoaffective disorder, bipolar type ([place]) [F25.0] 09/04/2015  . Non compliance w medication regimen [Z91.14] 01/17/2015  . Substance induced mood disorder ([place]) [F19.94] 02/08/2014  . 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 Cannabis abuse [F12.10] 08/15/2012   Total Time spent  with patient:  20 minutes  Past Psychiatric History: see above noted  Past Medical History:  Past Medical History  Diagnosis Date  . Asthma   . Seizures ([place])   . Brain ventricular shunt obstruction     hydrochelpis  . Depression   . Bipolar disorder [facility])     Past Surgical History  Procedure Laterality Date  . Tonsillectomy    . Ventriculo-peritoneal shunt placement / laparoscopic insertion peritoneal catheter     Family History:  Family History  Problem Relation Age of Onset  . Hypertension Mother   . Mental illness Neg Hx    Family Psychiatric  History:  See above noted  Social History:  History  Alcohol Use  . 7.2 oz/week  . 12 Cans of beer per week    Comment: 4-8 40 oz.on the weekend     History  Drug Use  . Yes  . Special: Marijuana, Other-see comments, Amphetamines    Comment: Adderall, "I don't do weed, only rarely"    Social History   Social History  . Marital Status: Single    Spouse Name: N/A  . Number of Children: N/A  . Years of Education: N/A   Social History Main Topics  . Smoking status: Current Every Day Smoker -- 1.00 packs/day    Types: Cigarettes  . Smokeless tobacco: None  . Alcohol Use: 7.2 oz/week    12 Cans of beer per week     Comment: 4-8 40 oz.on the weekend  . Drug Use: Yes    Special: Marijuana, Other-see comments, Amphetamines     Comment: Adderall, "I don't do weed,  only rarely"  . Sexual Activity: Not Asked   Other Topics Concern  . None   Social History Narrative   Additional Social History:   Sleep:  Improving   Appetite:   "OK"  Current Medications: Current Facility-Administered Medications  Medication Dose Route Frequency Provider Last Rate Last Dose  . acetaminophen (TYLENOL) tablet 650 mg  650 mg Oral Q6H PRN Delfin Gant, NP   650 mg at 09/04/15 2346  . alum & mag hydroxide-simeth (MAALOX/MYLANTA) 200-200-20 MG/5ML suspension 30 mL  30 mL Oral Q4H PRN Delfin Gant, NP      . Derrill Memo ON  09/12/2015] ARIPiprazole (ABILIFY) tablet 10 mg  10 mg Oral Daily Myer Peer Brode Sculley, MD      . guaiFENesin-dextromethorphan (ROBITUSSIN DM) 100-10 MG/5ML syrup 5 mL  5 mL Oral Q4H PRN Kathlee Nations, MD      . hydrOXYzine (ATARAX/VISTARIL) tablet 25 mg  25 mg Oral Q6H PRN Ursula Alert, MD      . ibuprofen (ADVIL,MOTRIN) tablet 600 mg  600 mg Oral Q6H PRN Kathlee Nations, MD      . lithium carbonate capsule 300 mg  300 mg Oral Daily Ursula Alert, MD   300 mg at 09/11/15 0937  . lithium carbonate capsule 600 mg  600 mg Oral QHS Jenne Campus, MD   600 mg at 09/10/15 2103  . magnesium hydroxide (MILK OF MAGNESIA) suspension 30 mL  30 mL Oral Daily PRN Delfin Gant, NP      . OLANZapine zydis (ZYPREXA) disintegrating tablet 5 mg  5 mg Oral Q6H PRN Ursula Alert, MD       Or  . OLANZapine (ZYPREXA) injection 5 mg  5 mg Intramuscular Q6H PRN Ursula Alert, MD        Lab Results:  No results found for this or any previous visit (from the past 48 hour(s)).  Physical Findings: AIMS: Facial and Oral Movements Muscles of Facial Expression: None, normal Lips and Perioral Area: None, normal Jaw: None, normal Tongue: None, normal,Extremity Movements Upper (arms, wrists, hands, fingers): None, normal Lower (legs, knees, ankles, toes): None, normal, Trunk Movements Neck, shoulders, hips: None, normal, Overall Severity Severity of abnormal movements (highest score from questions above): None, normal Incapacitation due to abnormal movements: None, normal Patient's awareness of abnormal movements (rate only patient's report): No Awareness, Dental Status Current problems with teeth and/or dentures?: No Does patient usually wear dentures?: No  CIWA:  CIWA-Ar Total: 2 COWS:     Musculoskeletal: Strength & Muscle Tone: within normal limits Gait & Station: normal Patient leans: N/A  Psychiatric Specialty Exam: Review of Systems  Psychiatric/Behavioral: Positive for depression and suicidal  ideas. Negative for hallucinations. The patient is nervous/anxious and has insomnia.   All other systems reviewed and are negative. denies any further episodes of vomiting, does endorse some nausea. No diarrhea.  Blood pressure 121/73, pulse 102, temperature 98.1 F (36.7 C), temperature source Oral, resp. rate 16, height _0  (1.575 m), weight 222 lb (100.699 kg), SpO2 96 %.Body mass index is 40.59 kg/(m^2).   General Appearance:   Improved grooming    Eye Contact:: Good  Speech:  Normal   Volume:  normal  Mood: irritable, somewhat dysphoric   Affect:  Irritable   Thought Process:presents linear at this time   Orientation: Full (Time, Place, and Person)  Thought Content: reports improving hallucinations, and does not present internally preoccupied. Expressing delusional ideations, stating he thinks he is the AntiChrist  Suicidal Thoughts: No, at this time denies suicidal plan or intention and contracts for safety on unit   Homicidal Thoughts: No  Memory: recent and remote grossly intact   Judgement:  Improving   Insight:  Fair   Psychomotor Activity:  Improving   Concentration:good   Recall:  Good   Fund of Knowledge: good   Language: Good  Akathisia: Negative  Handed: Right  AIMS (if indicated):    Assets: Resilience  ADL's: Intact  Cognition: WNL  Sleep: Number of Hours: 6        Assessment -  Patient  Presents vaguely irritable, dysphoric, but today better than yesterday. He describes improved hallucinations, and is presenting with improved grooming/ADLs,and more organized thought process . Continues to have delusional ideations . Tolerating Lithium and Abilify well . Agreeing to further Abilify titration .  Treatment Plan Summary: Encourage increased  group, milieu participation to work on coping skills and symptom reduction  Continue  Lithium to 300 mgrs QAM and 600 mgrs QHS , for mood disorder, history of suicidal ideations.  Increase   Abilify to 10  mgrs QDAY for mood disorder  Continue Trazodone 50 mg QHS PRN  for sleep, as needed  Continue Hydroxyzine 25 mgrs Q 6 hours PRN for anxiety as needed . Treatment team working on disposition options, working on ACT team referral    Neita Garnet, MD 09/11/2015, 3:28 PM

## 2015-09-11 NOTE — Progress Notes (Signed)
Adult Psychoeducational Group Note  Date:  09/11/2015 Time: 0830  Group Topic/Focus:  Orientation:   The focus of this group is to educate the patient on the purpose and policies of crisis stabilization and provide a format to answer questions about their admission.  The group details unit policies and expectations of patients while admitted.  Participation Level:  Did Not Attend  Participation Quality:    Affect:    Cognitive:    Insight:   Engagement in Group:    Modes of Intervention:    Additional Comments:    Velna Hedgecock L 09/11/2015, 9:08 AM

## 2015-09-11 NOTE — Progress Notes (Signed)
Pt continues to adamantly decline family or friend contact for collateral information. Pt refuses consent at this time.  [name], [place] Work [phone]

## 2015-09-11 NOTE — Progress Notes (Signed)
D: Pt reports feeling depressed and anxious this morning. Pt irritable on approach. During shift assessment pt appeared cautious and guarded. Pt verbalized little information to Probation officer. Pt stated that he slept well last night, denied suicidal thoughts and AVH. Pt compliant with taking meds this morning. Pt stated that he's not ready for discharge, he needs more time to get better.  A: Medications reviewed with pt. Medications administered as ordered per MD. Verbal support provided. Pt encouraged to attend groups. 15 minute checks performed for safety. R: Pt verbalized understanding of med regimen. Pt receptive to tx.

## 2015-09-11 NOTE — [facility] Group Notes (Signed)
[facility] Group Therapy 09/11/2015 1:15pm  Type of Therapy: [facility] Presentation  Pt did not attend, declined invitation.   [name], LCSWA 09/11/2015 1:50 PM

## 2015-09-11 NOTE — [facility] Suicide Risk Assessment (Signed)
[facility] INPATIENT:  Family/Significant Other Suicide Prevention Education  Suicide Prevention Education:  Patient Refusal for Family/Significant Other Suicide Prevention Education: The patient Murl Nakazawa has refused to provide written consent for family/significant other to be provided Family/Significant Other Suicide Prevention Education during admission and/or prior to discharge.  Physician notified. SPE reviewed with patient and brochure provided. Patient encouraged to return to hospital if having suicidal thoughts, patient verbalized his/her understanding and has no further questions at this time.   Bo Mcclintock 09/11/2015, 10:02 AM

## 2015-09-11 NOTE — Progress Notes (Signed)
Pt was observed at the beginning of the shift lying in his bed asleep.  He was easily awakened and reported that he was fine and had no needs or concerns.  He denies SI/HI/AVH at that time.  Conversation was minimal with patient, and he started to become irritated with Probation officer at the assessment questions.  Pt did get up and go to the dayroom after being awakened by Probation officer.  Pt did not elaborated on his discharge plans.  Discharged plans are in process.  Support and encouragement offered.  Pt is med compliant and declined to have a sleep aid tonight saying he did not need it.  Pt makes his needs known to staff.  Safety maintained with q15 minute checks.

## 2015-09-12 NOTE — Progress Notes (Signed)
Nursing Note: 0700-1900  D:   Pt states that he is not hearing voices as of yet today, but did yesterday..  States, "I don't want to talk about it."  when asked what voices state. Pt. rates depression at 3/10, hopelessness as 7/10 and anxiety 0/10 on his Self Inventory today. Pt denies physical pain. Noted improvement of mood and energy throughout the day.  A:  Encouraged to verbalize needs and concerns, active listening and support provided.  Continued Q 15 minute safety checks.    R:  Pt. denies A/V hallucinations and is currently able to verbally contract for safety.

## 2015-09-12 NOTE — Progress Notes (Signed)
Pt reports that he had a good day.  He denies SI/HI/AVH.  He is still feeling anxious and does not feel he is ready to discharge home.  He feels he needs to stay a couple more days.  Interaction with pt was minimal.  He makes his needs known to staff.  He spent more time in the dayroom this evening than the previous evening.  Support and encouragement offered.  Safety maintained with q15 minute checks.

## 2015-09-12 NOTE — Progress Notes (Signed)
Patient ID: Justin Chaney, male   DOB: May 30, 1996, 20 y.o.   MRN: [id number] [facility] MD Progress Note  09/12/2015 4:59 PM Eduardo Honor  MRN:  [id number] Subjective:  Patient reports he is feeling " a little better today". He states he is tolerating medications well. He is focusing more on discharge, and wants to be discharged soon.   Objective: I have discussed case with treatment team and have met with patient.  Today patient presenting calm, better related, not as irritable, and presenting with improved range of affect. Does not appear internally preoccupied at this time and does not express delusions or religious preoccupations at this time. Denies any suicidal or homicidal /violent ideations. He is tolerating Abilify increase well . No side effects. Patient more visible in day room, behavior in good control. Of note, patient had presented with episode of vomiting and nausea recently. At this time denies any nausea or vomiting, appetite "OK", and states " I think it was something I ate ". Currently tolerating Abilify well .     Principal Problem: Schizoaffective disorder, bipolar type ([place]) Diagnosis:   Patient Active Problem List   Diagnosis Date Noted  . Intermittent explosive disorder [F63.81] 09/05/2015  . Schizoaffective disorder, bipolar type ([place]) [F25.0] 09/04/2015  . Non compliance w medication regimen [Z91.14] 01/17/2015  . Substance induced mood disorder ([place]) [F19.94] 02/08/2014  . 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 Cannabis abuse [F12.10] 08/15/2012   Total Time spent with patient:  20 minutes  Past Psychiatric History: see above noted  Past Medical History:  Past Medical History  Diagnosis Date  . Asthma   . Seizures ([place])   . Brain ventricular shunt obstruction     hydrochelpis  . Depression   . Bipolar disorder [facility])     Past Surgical History  Procedure Laterality Date  . Tonsillectomy    . Ventriculo-peritoneal shunt placement  / laparoscopic insertion peritoneal catheter     Family History:  Family History  Problem Relation Age of Onset  . Hypertension Mother   . Mental illness Neg Hx    Family Psychiatric  History:  See above noted  Social History:  History  Alcohol Use  . 7.2 oz/week  . 12 Cans of beer per week    Comment: 4-8 40 oz.on the weekend     History  Drug Use  . Yes  . Special: Marijuana, Other-see comments, Amphetamines    Comment: Adderall, "I don't do weed, only rarely"    Social History   Social History  . Marital Status: Single    Spouse Name: N/A  . Number of Children: N/A  . Years of Education: N/A   Social History Main Topics  . Smoking status: Current Every Day Smoker -- 1.00 packs/day    Types: Cigarettes  . Smokeless tobacco: None  . Alcohol Use: 7.2 oz/week    12 Cans of beer per week     Comment: 4-8 40 oz.on the weekend  . Drug Use: Yes    Special: Marijuana, Other-see comments, Amphetamines     Comment: Adderall, "I don't do weed, only rarely"  . Sexual Activity: Not Asked   Other Topics Concern  . None   Social History Narrative   Additional Social History:   Sleep:  Improving   Appetite:   "OK"  Current Medications: Current Facility-Administered Medications  Medication Dose Route Frequency Provider Last Rate Last Dose  . acetaminophen (TYLENOL) tablet 650 mg  650 mg Oral Q6H PRN  Delfin Gant, NP   650 mg at 09/04/15 2346  . alum & mag hydroxide-simeth (MAALOX/MYLANTA) 200-200-20 MG/5ML suspension 30 mL  30 mL Oral Q4H PRN Delfin Gant, NP      . ARIPiprazole (ABILIFY) tablet 10 mg  10 mg Oral Daily Jenne Campus, MD   10 mg at 09/12/15 0800  . guaiFENesin-dextromethorphan (ROBITUSSIN DM) 100-10 MG/5ML syrup 5 mL  5 mL Oral Q4H PRN Kathlee Nations, MD      . hydrOXYzine (ATARAX/VISTARIL) tablet 25 mg  25 mg Oral Q6H PRN Ursula Alert, MD      . ibuprofen (ADVIL,MOTRIN) tablet 600 mg  600 mg Oral Q6H PRN Kathlee Nations, MD      . lithium  carbonate capsule 300 mg  300 mg Oral Daily Ursula Alert, MD   300 mg at 09/12/15 0918  . lithium carbonate capsule 600 mg  600 mg Oral QHS Jenne Campus, MD   600 mg at 09/11/15 2110  . magnesium hydroxide (MILK OF MAGNESIA) suspension 30 mL  30 mL Oral Daily PRN Delfin Gant, NP      . OLANZapine zydis (ZYPREXA) disintegrating tablet 5 mg  5 mg Oral Q6H PRN Ursula Alert, MD       Or  . OLANZapine (ZYPREXA) injection 5 mg  5 mg Intramuscular Q6H PRN Ursula Alert, MD        Lab Results:  No results found for this or any previous visit (from the past 48 hour(s)).  Physical Findings: AIMS: Facial and Oral Movements Muscles of Facial Expression: None, normal Lips and Perioral Area: None, normal Jaw: None, normal Tongue: None, normal,Extremity Movements Upper (arms, wrists, hands, fingers): None, normal Lower (legs, knees, ankles, toes): None, normal, Trunk Movements Neck, shoulders, hips: None, normal, Overall Severity Severity of abnormal movements (highest score from questions above): None, normal Incapacitation due to abnormal movements: None, normal Patient's awareness of abnormal movements (rate only patient's report): No Awareness, Dental Status Current problems with teeth and/or dentures?: No Does patient usually wear dentures?: No  CIWA:  CIWA-Ar Total: 2 COWS:     Musculoskeletal: Strength & Muscle Tone: within normal limits Gait & Station: normal Patient leans: N/A  Psychiatric Specialty Exam: Review of Systems  Psychiatric/Behavioral: Positive for depression and suicidal ideas. Negative for hallucinations. The patient is nervous/anxious and has insomnia.   All other systems reviewed and are negative. denies any further episodes of vomiting, does endorse some nausea. No diarrhea.  Blood pressure 123/77, pulse 96, temperature 98.3 F (36.8 C), temperature source Oral, resp. rate 16, height '5\' 2"'  (1.575 m), weight 222 lb (100.699 kg), SpO2 96 %.Body mass  index is 40.59 kg/(m^2).   General Appearance:   Improved grooming    Eye Contact:: Good  Speech:  Normal   Volume:  normal  Mood:less irritable today  Affect:   Better related, less irritable, smiles at times today   Thought Process:presents linear at this time   Orientation: Full (Time, Place, and Person)  Thought Content: at this time does not endorse ongoing hallucinations and does not appear internally preoccupied, no delusions expressed today.   Suicidal Thoughts: No, at this time denies suicidal plan or intention and contracts for safety on unit   Homicidal Thoughts: No denies any  Homicidal or violent ideations   Memory: recent and remote grossly intact   Judgement:  Improving   Insight:  Fair   Psychomotor Activity:  Improving   Concentration:good   Recall:  Good   Fund of Knowledge: good   Language: Good  Akathisia: Negative  Handed: Right  AIMS (if indicated):    Assets: Resilience  ADL's: Intact  Cognition: WNL  Sleep: Number of Hours: 6        Assessment -   Improved today- less irritable, better related, affect more reactive, and today not endorsing hallucinations, and not expressing delusional ideations. Has tolerating Abilify titration and Lithium well , and recent episode of vomiting was isolated. At this time no nausea, no vomiting, no abdominal distress or pain . As he improves he is focusing on being discharged soon.  Treatment Plan Summary: Encourage increased  group, milieu participation to work on coping skills and symptom reduction  Continue  Lithium  300 mgrs QAM and 600 mgrs QHS , for mood disorder, history of suicidal ideations.  Continue  Abilify  10  mgrs QDAY for mood disorder  Continue Trazodone 50 mg QHS PRN  for sleep, as needed  Continue Hydroxyzine 25 mgrs Q 6 hours PRN for anxiety as needed . Treatment team working on discharge [place], MD 09/12/2015, 4:59 PM

## 2015-09-12 NOTE — Progress Notes (Signed)
Psychoeducational Group Note  Date:  09/12/2015 Time:  2337  Group Topic/Focus:  Wrap-Up Group:   The focus of this group is to help patients review their daily goal of treatment and discuss progress on daily workbooks.  Participation Level: Did Not Attend  Participation Quality:  Not Applicable  Affect:  Not Applicable  Cognitive:  Not Applicable  Insight:  Not Applicable  Engagement in Group: Not Applicable  Additional Comments:  The patient did not attend group this evening.   Archie Balboa S 09/12/2015, 11:37 PM

## 2015-09-12 NOTE — BHH Group Notes (Signed)
[facility] LCSW Aftercare Discharge Planning Group Note  09/12/2015 8:45 AM  Pt did not attend, declined invitation.   [name], LCSWA 09/12/2015 9:35 AM

## 2015-09-12 NOTE — BHH Group Notes (Signed)
[facility] LCSW Group Therapy 09/12/2015 1:15pm  Type of Therapy: Group Therapy- Feelings Around Relapse and Recovery  Pt did not attend, declined invitation.    Norman Clay [phone] 09/12/2015 4:33 PM

## 2015-09-13 MED ORDER — ARIPIPRAZOLE 10 MG PO TABS
10.0000 mg | ORAL_TABLET | Freq: Every day | ORAL | Status: DC
Start: 2015-09-13 — End: 2015-10-17

## 2015-09-13 MED ORDER — LITHIUM CARBONATE 600 MG PO CAPS: 600.0000 mg | ORAL_CAPSULE | Freq: Every day | ORAL | Status: DC

## 2015-09-13 MED ORDER — LITHIUM CARBONATE 300 MG PO CAPS: 300.0000 mg | ORAL_CAPSULE | Freq: Every day | ORAL | Status: DC

## 2015-09-13 MED ORDER — HYDROXYZINE HCL 25 MG PO TABS: 25.0000 mg | ORAL_TABLET | Freq: Four times a day (QID) | ORAL | Status: DC | PRN

## 2015-09-13 NOTE — Discharge Summary (Signed)
Physician Discharge Summary Note  Patient:  Justin Chaney is an 20 y.o., male MRN:  [id number] DOB:  1995-10-21 Patient phone:  [phone] (home)  Patient address:   [place] [address],  Total Time spent with patient: 45 minutes  Date of Admission:  09/04/2015 Date of Discharge: 09/13/2015  Reason for Admission:PER HPI-Justin Chaney is a 20 y.o. male PMH significant for bipolar disorder, depression, seizures presenting with suicidal ideations. He states 3 days ago he tried to hang himself with a rope and the rope broke. He has had a long-standing history of suicidal ideations. He does endorse fevers, cough, sore throat, nausea, one episode of emesis last night (nonbloody nonbilious). He states he was supposed to follow-up with [facility] and he did not. He denies chest pain, shortness of breath, change in bowel or bladder habits, auditory/visual hallucinations, support system at home.   Principal Problem: Schizoaffective disorder, bipolar type [facility]) Discharge Diagnoses: Patient Active Problem List   Diagnosis Date Noted  . Intermittent explosive disorder [F63.81] 09/05/2015  . Schizoaffective disorder, bipolar type ([place]) [F25.0] 09/04/2015  . Non compliance w medication regimen [Z91.14] 01/17/2015  . Substance induced mood disorder ([place]) [F19.94] 02/08/2014  . 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 Cannabis abuse [F12.10] 08/15/2012    Past Psychiatric History:SEE ABOVE  Past Medical History:  Past Medical History  Diagnosis Date  . Asthma   . Seizures ([place])   . Brain ventricular shunt obstruction     hydrochelpis  . Depression   . Bipolar disorder [facility])     Past Surgical History  Procedure Laterality Date  . Tonsillectomy    . Ventriculo-peritoneal shunt placement / laparoscopic insertion peritoneal catheter     Family History:  Family History  Problem Relation Age of Onset  . Hypertension Mother   . Mental illness Neg Hx     Family Psychiatric  History: SEE ABOVE Social History:  History  Alcohol Use  . 7.2 oz/week  . 12 Cans of beer per week    Comment: 4-8 40 oz.on the weekend     History  Drug Use  . Yes  . Special: Marijuana, Other-see comments, Amphetamines    Comment: Adderall, "I don't do weed, only rarely"    Social History   Social History  . Marital Status: Single    Spouse Name: N/A  . Number of Children: N/A  . Years of Education: N/A   Social History Main Topics  . Smoking status: Current Every Day Smoker -- 1.00 packs/day    Types: Cigarettes  . Smokeless tobacco: None  . Alcohol Use: 7.2 oz/week    12 Cans of beer per week     Comment: 4-8 40 oz.on the weekend  . Drug Use: Yes    Special: Marijuana, Other-see comments, Amphetamines     Comment: Adderall, "I don't do weed, only rarely"  . Sexual Activity: Not Asked   Other Topics Concern  . None   Social History Narrative    Hospital Course:  Justin Chaney was admitted for Schizoaffective disorder, bipolar type ([place]) ,  and crisis management.  Pt was treated discharged with the medications listed below under Medication List.  Medical problems were identified and treated as needed.  Home medications were restarted as appropriate.  Improvement was monitored by observation and [name] 's daily report of symptom reduction.  Emotional and mental status was monitored by daily self-inventory reports completed by [name] and clinical staff.  Daeron Feigenbaum was evaluated by the treatment team for stability and plans for continued recovery upon discharge. Shade Bransfield 's motivation was an integral factor for scheduling further treatment. Employment, transportation, bed availability, health status, family support, and any pending legal issues were also considered during hospital stay. Pt was offered further treatment options upon discharge including but not limited to Residential, Intensive Outpatient,  and Outpatient treatment.  Adryel Cheuvront will follow up with the services as listed below under Follow Up Information.     Upon completion of this admission the patient was both mentally and medically stable for discharge denying suicidal/homicidal ideation, auditory/visual/tactile hallucinations, delusional thoughts and paranoia.    Charon Zehren responded well to treatment with Abilify and  Lithium. Pt demonstrated improvement without reported or observed adverse effects to the point of stability appropriate for outpatient management. Pertinent labs include: Lithium level 0.06 (l) , Lipid Panel and CMP  for which outpatient follow-up is necessary for lab recheck as mentioned below. Reviewed CBC, CMP, BAL, and UDS; all unremarkable aside from noted exceptions.   Physical Findings: AIMS: Facial and Oral Movements Muscles of Facial Expression: None, normal Lips and Perioral Area: None, normal Jaw: None, normal Tongue: None, normal,Extremity Movements Upper (arms, wrists, hands, fingers): None, normal Lower (legs, knees, ankles, toes): None, normal, Trunk Movements Neck, shoulders, hips: None, normal, Overall Severity Severity of abnormal movements (highest score from questions above): None, normal Incapacitation due to abnormal movements: None, normal Patient's awareness of abnormal movements (rate only patient's report): No Awareness, Dental Status Current problems with teeth and/or dentures?: No Does patient usually wear dentures?: No  CIWA:  CIWA-Ar Total: 2 COWS:     Musculoskeletal: Strength & Muscle Tone: within normal limits Gait & Station: normal Patient leans: N/A  Psychiatric Specialty Exam: SEE SRA BY MD  Review of Systems  Psychiatric/Behavioral: Negative for suicidal ideas and hallucinations. Depression: stable. Nervous/anxious: stable.   All other systems reviewed and are negative.   Blood pressure 123/77, pulse 96, temperature 98.3 F (36.8 C), temperature source  Oral, resp. rate 16, height 5\' 2"  (1.575 m), weight 100.699 kg (222 lb), SpO2 96 %.Body mass index is 40.59 kg/(m^2).  Have you used any form of tobacco in the last 30 days? (Cigarettes, Smokeless Tobacco, Cigars, and/or Pipes): Yes  Has this patient used any form of tobacco in the last 30 days? (Cigarettes, Smokeless Tobacco, Cigars, and/or Pipes)  No  Metabolic Disorder Labs:  Lab Results  Component Value Date   HGBA1C 5.0 09/09/2015   MPG 97 09/09/2015   Lab Results  Component Value Date   PROLACTIN 8.0 09/09/2015   Lab Results  Component Value Date   CHOL 133 09/09/2015   TRIG 70 09/09/2015   HDL 35* 09/09/2015   CHOLHDL 3.8 09/09/2015   VLDL 14 09/09/2015   LDLCALC 84 09/09/2015    See Psychiatric Specialty Exam and Suicide Risk Assessment completed by Attending Physician prior to discharge.  Discharge destination:  Home  Is patient on multiple antipsychotic therapies at discharge:  No   Has Patient had three or more failed trials of antipsychotic monotherapy by history:  No  Recommended Plan for Multiple Antipsychotic Therapies: NA  Discharge Instructions    Activity as tolerated - No restrictions    Complete by:  As directed      Diet general    Complete by:  As directed      Discharge instructions    Complete by:  As directed   Take all medications  as prescribed. Keep all follow-up appointments as scheduled.  Do not consume alcohol or use illegal drugs while on prescription medications. Report any adverse effects from your medications to your primary care provider promptly.  In the event of recurrent symptoms or worsening symptoms, call 911, a crisis hotline, or go to the nearest emergency department for evaluation.            Medication List    STOP taking these medications        OLANZapine 5 MG tablet  Commonly known as:  ZYPREXA      TAKE these medications      Indication   ARIPiprazole 10 MG tablet  Commonly known as:  ABILIFY  Take 1 tablet  (10 mg total) by mouth daily.   Indication:  mood stablization     hydrOXYzine 25 MG tablet  Commonly known as:  ATARAX/VISTARIL  Take 1 tablet (25 mg total) by mouth every 6 (six) hours as needed for anxiety.   Indication:  Anxiety Neurosis     lithium carbonate 300 MG capsule  Take 1 capsule (300 mg total) by mouth daily.   Indication:  mood stabilization     lithium 600 MG capsule  Take 1 capsule (600 mg total) by mouth at bedtime.   Indication:  mood stablilzation           Follow-up Information    Follow up with [facility] On 10/06/2015.   Why:  at 2:00pm for medication management with Josph Macho, NP. At this appointment, please ask to be scheduled for a therapy appointment   Contact information:   [address] [place] [address] [phone] Fax: [phone]      Follow up with [facility] On 10/13/2015.   Why:  AT 2:15 PM FOR HOSPITAL/ MEDICAL FOLLOW UP   Contact information:   [place] [phone]       Follow-up recommendations:  Activity:  as tolerated  Diet:  heart hearty  Comments:  Take all medications as prescribed. Keep all follow-up appointments as scheduled.  Do not consume alcohol or use illegal drugs while on prescription medications. Report any adverse effects from your medications to your primary care provider promptly.  In the event of recurrent symptoms or worsening symptoms, call 911, a crisis hotline, or go to the nearest emergency department for evaluation.   Signed: Derrill Center, NP 09/13/2015, 8:44 AM

## 2015-09-13 NOTE — BHH Suicide Risk Assessment (Signed)
[facility] Discharge Suicide Risk Assessment   Principal Problem: Schizoaffective disorder, bipolar type [facility]) Discharge Diagnoses:  Patient Active Problem List   Diagnosis Date Noted  . Intermittent explosive disorder [F63.81] 09/05/2015  . Schizoaffective disorder, bipolar type ([place]) [F25.0] 09/04/2015  . Non compliance w medication regimen [Z91.14] 01/17/2015  . Substance induced mood disorder ([place]) [F19.94] 02/08/2014  . 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 Cannabis abuse [F12.10] 08/15/2012    Total Time spent with patient: 30 minutes  Musculoskeletal: Strength & Muscle Tone: within normal limits Gait & Station: normal Patient leans: N/A  Psychiatric Specialty Exam: Review of Systems  Psychiatric/Behavioral: Negative for depression, suicidal ideas and hallucinations. The patient is not nervous/anxious and does not have insomnia.   All other systems reviewed and are negative.   Blood pressure 123/77, pulse 96, temperature 98.3 F (36.8 C), temperature source Oral, resp. rate 16, height 5\' 2"  (1.575 m), weight 100.699 kg (222 lb), SpO2 96 %.Body mass index is 40.59 kg/(m^2).  General Appearance: Casual  Eye Contact::  Fair  Speech:  Clear and [id number]  Volume:  Normal  Mood:  Euthymic  Affect:  Appropriate  Thought Process:  Coherent  Orientation:  Full (Time, Place, and Person)  Thought Content:  WDL  Suicidal Thoughts:  No  Homicidal Thoughts:  No  Memory:  Immediate;   Fair Recent;   Fair Remote;   Fair  Judgement:  Fair  Insight:  Fair  Psychomotor Activity:  Normal  Concentration:  Fair  Recall:  [employer] of Knowledge:Fair  Language: Fair  Akathisia:  No    AIMS (if indicated):   pt refused  Assets:  Desire for Improvement  Sleep:  Number of Hours: 6  Cognition: WNL  ADL's:  Intact   Mental Status Per Nursing Assessment::   On Admission:  Suicidal ideation indicated by patient  Demographic Factors:  Male  Loss  Factors: NA  Historical Factors: Impulsivity  Risk Reduction Factors:   Positive social support  Continued Clinical Symptoms:  Previous Psychiatric Diagnoses and Treatments Medical Diagnoses and Treatments/Surgeries  Cognitive Features That Contribute To Risk:  Polarized thinking    Suicide Risk:  Minimal: No identifiable suicidal ideation.  Patients presenting with no risk factors but with morbid ruminations; may be classified as minimal risk based on the severity of the depressive symptoms  Follow-up Information    Follow up with [facility] On 10/06/2015.   Why:  at 2:00pm for medication management with Josph Macho, NP. At this appointment, please ask to be scheduled for a therapy appointment   Contact information:   [address], [address] [place] [address] [phone] Fax: [phone]      Follow up with [facility] On 10/13/2015.   Why:  AT 2:15 PM FOR HOSPITAL/ MEDICAL FOLLOW UP   Contact information:   [place] [phone]       Plan Of Care/Follow-up recommendations:  Activity:  no restrictions Diet:  regular Tests:  Li level on 09/15/15. Other:  none  Kelly Eisler, MD 09/13/2015, 8:02 AM

## 2015-09-13 NOTE — BHH Group Notes (Signed)
[facility] LCSW Group Therapy  09/13/2015  10:30 AM to 11:30 PM  Type of Therapy:  Group Therapy  Participation Level: Pt chose not to attend although was invited and encouraged to do so     Sheilah Pigeon, LCSW

## 2015-09-13 NOTE — Progress Notes (Signed)
D: Pt how is alert and oriented x 4. Pt at the time of assessment endorse mild depression, states, "my depression is about a 3 right now." Pt however, denies anxiety, pain, SI/HI and AVH; he states, "I have not heard any voice today; it's these voices that usually get me anxious." Pt remained calm and cooperative through the shift assessment.  A: Medications offered as prescribed.  Support, encouragement, and safe environment provided.  15-minute safety checks continue.  R: Pt was med compliant.  Pt did not attend wrap-up group. Safety checks continue.

## 2015-09-13 NOTE — Progress Notes (Signed)
  [facility] Adult Case Management Discharge Plan :  Will you be returning to the same living situation after discharge:  Yes,  with friend At discharge, do you have transportation home?: Yes,  bus pass provided Do you have the ability to pay for your medications: No.  Release of information consent forms completed and in the chart;  Patient's signature needed at discharge.  Patient to Follow up at: Follow-up Information    Follow up with [facility] On 10/06/2015.   Why:  at 2:00pm for medication management with Josph Macho, NP. At this appointment, please ask to be scheduled for a therapy appointment   Contact information:   [address] [place] [address] [phone] Fax: [phone]      Follow up with [facility] On 10/13/2015.   Why:  AT 2:15 PM FOR HOSPITAL/ MEDICAL FOLLOW UP   Contact information:   [place] [phone]       Follow up with Primary Care Provider. Schedule an appointment as soon as possible for a visit in 2 days.   Why:  Labs; lithium level recheck on Monday      Next level of care provider has access to [place] and Suicide Prevention discussed: Yes,  with patient, but refused with anyone else  Have you used any form of tobacco in the last 30 days? (Cigarettes, Smokeless Tobacco, Cigars, and/or Pipes): Yes  Has patient been referred to the Quitline?: Patient refused referral  Patient has been referred for addiction treatment: N/A  Lysle Dingwall 09/13/2015, 1:08 PM

## 2015-09-13 NOTE — Progress Notes (Signed)
Patient discharged home. DC instructions provided and explained. Medications reviewed. Rx given. All questions answered. Pt stable at discharge. Denies SI, HI, AVH.  

## 2015-09-13 NOTE — BHH Group Notes (Signed)
[facility] Group Notes:  (Nursing/MHT/Case Management/Adjunct)  Date:  09/13/2015  Time:  0900 am  Type of Therapy:  Psychoeducational Group  Participation Level:  Did Not Attend  Patient invited; declined to attend.  Justin Chaney 09/13/2015, 10:18 AM

## 2015-10-10 ENCOUNTER — Encounter (HOSPITAL_COMMUNITY): Payer: Self-pay | Admitting: Oncology

## 2015-10-10 ENCOUNTER — Emergency Department (HOSPITAL_COMMUNITY)
Admission: EM | Admit: 2015-10-10 | Discharge: 2015-10-11 | Disposition: A | Discharge status: Other Acute Inpatient Hospital | Payer: BLUE CROSS/BLUE SHIELD | Attending: Emergency Medicine | Admitting: Emergency Medicine

## 2015-10-10 DIAGNOSIS — F333 Major depressive disorder, recurrent, severe with psychotic symptoms: Secondary | ICD-10-CM

## 2015-10-10 DIAGNOSIS — F1510 Other stimulant abuse, uncomplicated: Secondary | ICD-10-CM | POA: Diagnosis not present

## 2015-10-10 DIAGNOSIS — J45909 Unspecified asthma, uncomplicated: Secondary | ICD-10-CM | POA: Diagnosis not present

## 2015-10-10 DIAGNOSIS — F1410 Cocaine abuse, uncomplicated: Secondary | ICD-10-CM | POA: Diagnosis not present

## 2015-10-10 DIAGNOSIS — Z79899 Other long term (current) drug therapy: Secondary | ICD-10-CM | POA: Insufficient documentation

## 2015-10-10 DIAGNOSIS — F3130 Bipolar disorder, current episode depressed, mild or moderate severity, unspecified: Secondary | ICD-10-CM | POA: Diagnosis not present

## 2015-10-10 DIAGNOSIS — F32A Depression, unspecified: Secondary | ICD-10-CM

## 2015-10-10 DIAGNOSIS — R45851 Suicidal ideations: Secondary | ICD-10-CM

## 2015-10-10 DIAGNOSIS — Z880 Allergy status to penicillin: Secondary | ICD-10-CM | POA: Diagnosis not present

## 2015-10-10 DIAGNOSIS — F329 Major depressive disorder, single episode, unspecified: Secondary | ICD-10-CM

## 2015-10-10 DIAGNOSIS — F17210 Nicotine dependence, cigarettes, uncomplicated: Secondary | ICD-10-CM | POA: Insufficient documentation

## 2015-10-10 DIAGNOSIS — F1994 Other psychoactive substance use, unspecified with psychoactive substance-induced mood disorder: Secondary | ICD-10-CM | POA: Diagnosis not present

## 2015-10-10 NOTE — ED Notes (Signed)
Per [employer] pt reports have SI w/o plan all day today and would like to come to [facility] and be IVC'd.  [employer] did not take out IVC papers as pt came voluntarily.

## 2015-10-11 ENCOUNTER — Encounter (HOSPITAL_COMMUNITY): Payer: Self-pay

## 2015-10-11 ENCOUNTER — Inpatient Hospital Stay (HOSPITAL_COMMUNITY)
Admission: AD | Admit: 2015-10-11 | Discharge: 2015-10-17 | DRG: 885 | Disposition: A | Discharge status: Home / Self Care | Payer: BLUE CROSS/BLUE SHIELD | Source: Intra-hospital | Attending: Psychiatry | Admitting: Psychiatry

## 2015-10-11 ENCOUNTER — Encounter (HOSPITAL_COMMUNITY): Payer: Self-pay | Admitting: Registered Nurse

## 2015-10-11 DIAGNOSIS — F1994 Other psychoactive substance use, unspecified with psychoactive substance-induced mood disorder: Secondary | ICD-10-CM

## 2015-10-11 DIAGNOSIS — F3130 Bipolar disorder, current episode depressed, mild or moderate severity, unspecified: Secondary | ICD-10-CM | POA: Diagnosis not present

## 2015-10-11 DIAGNOSIS — F322 Major depressive disorder, single episode, severe without psychotic features: Secondary | ICD-10-CM | POA: Insufficient documentation

## 2015-10-11 DIAGNOSIS — F1410 Cocaine abuse, uncomplicated: Secondary | ICD-10-CM | POA: Diagnosis present

## 2015-10-11 DIAGNOSIS — F333 Major depressive disorder, recurrent, severe with psychotic symptoms: Secondary | ICD-10-CM | POA: Diagnosis not present

## 2015-10-11 DIAGNOSIS — F331 Major depressive disorder, recurrent, moderate: Secondary | ICD-10-CM | POA: Diagnosis not present

## 2015-10-11 DIAGNOSIS — F259 Schizoaffective disorder, unspecified: Secondary | ICD-10-CM | POA: Insufficient documentation

## 2015-10-11 DIAGNOSIS — R45851 Suicidal ideations: Secondary | ICD-10-CM | POA: Diagnosis present

## 2015-10-11 DIAGNOSIS — Z8249 Family history of ischemic heart disease and other diseases of the circulatory system: Secondary | ICD-10-CM

## 2015-10-11 DIAGNOSIS — F17210 Nicotine dependence, cigarettes, uncomplicated: Secondary | ICD-10-CM | POA: Diagnosis present

## 2015-10-11 DIAGNOSIS — F329 Major depressive disorder, single episode, unspecified: Secondary | ICD-10-CM | POA: Diagnosis present

## 2015-10-11 DIAGNOSIS — F419 Anxiety disorder, unspecified: Secondary | ICD-10-CM | POA: Diagnosis present

## 2015-10-11 DIAGNOSIS — J45909 Unspecified asthma, uncomplicated: Secondary | ICD-10-CM | POA: Diagnosis present

## 2015-10-11 DIAGNOSIS — F339 Major depressive disorder, recurrent, unspecified: Secondary | ICD-10-CM | POA: Diagnosis not present

## 2015-10-11 LAB — CBC
HCT: 47 % (ref 39.0–52.0)
Hemoglobin: 15.8 g/dL (ref 13.0–17.0)
MCH: 28.3 pg (ref 26.0–34.0)
MCHC: 33.6 g/dL (ref 30.0–36.0)
MCV: 84.2 fL (ref 78.0–100.0)
Platelets: 261 10*3/uL (ref 150–400)
RBC: 5.58 MIL/uL (ref 4.22–5.81)
RDW: 12 % (ref 11.5–15.5)
WBC: 9.3 10*3/uL (ref 4.0–10.5)

## 2015-10-11 LAB — RAPID URINE DRUG SCREEN, HOSP PERFORMED
Amphetamines: POSITIVE — AB
Barbiturates: NOT DETECTED
Benzodiazepines: NOT DETECTED
Cocaine: POSITIVE — AB
Opiates: NOT DETECTED
Tetrahydrocannabinol: NOT DETECTED

## 2015-10-11 LAB — COMPREHENSIVE METABOLIC PANEL
ALT: 51 U/L (ref 17–63)
AST: 79 U/L — ABNORMAL HIGH (ref 15–41)
Albumin: 4.5 g/dL (ref 3.5–5.0)
Alkaline Phosphatase: 83 U/L (ref 38–126)
Anion gap: 9 (ref 5–15)
BUN: 13 mg/dL (ref 6–20)
CO2: 24 mmol/L (ref 22–32)
Calcium: 9.3 mg/dL (ref 8.9–10.3)
Chloride: 106 mmol/L (ref 101–111)
Creatinine, Ser: 1.07 mg/dL (ref 0.61–1.24)
GFR calc Af Amer: >60 mL/min (ref >60)
GFR calc non Af Amer: >60 mL/min (ref >60)
Glucose, Bld: 94 mg/dL (ref 65–99)
Potassium: 3.6 mmol/L (ref 3.5–5.1)
Sodium: 139 mmol/L (ref 135–145)
Total Bilirubin: 0.5 mg/dL (ref 0.3–1.2)
Total Protein: 7.6 g/dL (ref 6.5–8.1)

## 2015-10-11 LAB — ACETAMINOPHEN LEVEL: Acetaminophen (Tylenol), Serum: <10 ug/mL — ABNORMAL LOW (ref 10–30)

## 2015-10-11 LAB — LITHIUM LEVEL: Lithium Lvl: <0.06 mmol/L — ABNORMAL LOW (ref 0.60–1.20)

## 2015-10-11 LAB — ETHANOL: Alcohol, Ethyl (B): <5 mg/dL (ref <5)

## 2015-10-11 LAB — SALICYLATE LEVEL: Salicylate Lvl: <4 mg/dL (ref 2.8–30.0)

## 2015-10-11 MED ORDER — LITHIUM CARBONATE 300 MG PO CAPS: 600.0000 mg | ORAL_CAPSULE | Freq: Every day | ORAL | Status: DC

## 2015-10-11 MED ORDER — LITHIUM CARBONATE 300 MG PO CAPS
300.0000 mg | ORAL_CAPSULE | Freq: Every day | ORAL | Status: DC
  Administered 2015-10-13 – 2015-10-16 (×4): 300 mg via ORAL
  Filled 2015-10-11 (×7): qty 1

## 2015-10-11 MED ORDER — LITHIUM CARBONATE 300 MG PO CAPS
600.0000 mg | ORAL_CAPSULE | Freq: Every day | ORAL | Status: DC
  Administered 2015-10-11 – 2015-10-15 (×5): 600 mg via ORAL
  Filled 2015-10-11 (×8): qty 2

## 2015-10-11 MED ORDER — HYDROXYZINE HCL 25 MG PO TABS: 25.0000 mg | ORAL_TABLET | Freq: Three times a day (TID) | ORAL | Status: DC | PRN

## 2015-10-11 MED ORDER — MAGNESIUM HYDROXIDE 400 MG/5ML PO SUSP: 30.0000 mL | Freq: Every day | ORAL | Status: DC | PRN

## 2015-10-11 MED ORDER — NICOTINE 21 MG/24HR TD PT24: 21.0000 mg | MEDICATED_PATCH | Freq: Every day | TRANSDERMAL | Status: DC

## 2015-10-11 MED ORDER — ONDANSETRON HCL 4 MG PO TABS: 4.0000 mg | ORAL_TABLET | Freq: Three times a day (TID) | ORAL | Status: DC | PRN

## 2015-10-11 MED ORDER — ARIPIPRAZOLE 10 MG PO TABS
10.0000 mg | ORAL_TABLET | Freq: Every day | ORAL | Status: DC
  Administered 2015-10-11: 10 mg via ORAL
  Filled 2015-10-11: qty 1

## 2015-10-11 MED ORDER — ALUM & MAG HYDROXIDE-SIMETH 200-200-20 MG/5ML PO SUSP: 30.0000 mL | ORAL | Status: DC | PRN

## 2015-10-11 MED ORDER — LITHIUM CARBONATE 300 MG PO CAPS
300.0000 mg | ORAL_CAPSULE | Freq: Every day | ORAL | Status: DC
  Administered 2015-10-11: 300 mg via ORAL
  Filled 2015-10-11: qty 1

## 2015-10-11 MED ORDER — ZOLPIDEM TARTRATE 5 MG PO TABS: 5.0000 mg | ORAL_TABLET | Freq: Every evening | ORAL | Status: DC | PRN

## 2015-10-11 MED ORDER — ACETAMINOPHEN 325 MG PO TABS: 650.0000 mg | ORAL_TABLET | ORAL | Status: DC | PRN

## 2015-10-11 MED ORDER — LORAZEPAM 1 MG PO TABS: 1.0000 mg | ORAL_TABLET | Freq: Three times a day (TID) | ORAL | Status: DC | PRN

## 2015-10-11 MED ORDER — ARIPIPRAZOLE 10 MG PO TABS
10.0000 mg | ORAL_TABLET | Freq: Every day | ORAL | Status: DC
  Administered 2015-10-13 – 2015-10-17 (×4): 10 mg via ORAL
  Filled 2015-10-11 (×9): qty 1

## 2015-10-11 NOTE — BH Assessment (Signed)
Assessment completed. Inpatient treatment is recommended. Informed Caryl Ada, PA-C of the recommendation.

## 2015-10-11 NOTE — Progress Notes (Signed)
Justin Chaney has been in his room most of the evening. Upon my initial assessment he was observed to be lying in his bed in the dark. When I called his name he yelled "What do you want?". I stated that I needed to know if he was indeed Justin Chaney and that there was no reason to yell at me as I am only here to help him. He calmed a bit and then stated that he was Justin Chaney. He did not attend group. He did awaken later in the night to receive his medication and snacks. He was slightly more conversant upon our second encounter. Denies SI/HI/AVH. Encouragement and support given. Medications administered as prescribed. Continue Q 15 minute checks for patient safety and medication effectiveness.

## 2015-10-11 NOTE — ED Notes (Signed)
Pt alert & oriented x3. All belongings returned to pt who signed for same. Transported to [facility] by [facility].

## 2015-10-11 NOTE — Progress Notes (Signed)
Justin Chaney was admitted to room 403-2 from [facility] ED.  He came in voicing suicidal thoughts.  He stated that he tried to cut his throat but doesn't want to talk about it.  He has a history or multiple hospitalizations and suicide attempts.  He has been off his medications over three weeks.  He reports hearing voices but this is normal and they don't bother him.  He was very agitated during the admission and didn't want to complete the skin assessment.  He did finally allow writer to complete skin assessment with male MHT present.  His medical history is tonsillectomy and VP shunt placement.  Skin search completed  and left forearm old well healed scar and VP shunt incision site in middle of abdomen/well healed.  No other skin issues noted.  Oriented him to the unit and admission paperwork completed.  Belongings searched and secured in locker #02 (back pack, bags/license and red sneakers).  Q 15 minute checks initiated for safety.  We will monitor the progress towards his goals.

## 2015-10-11 NOTE — ED Provider Notes (Signed)
CSN: [id number]     Arrival date & time 10/10/15  2337 History   First MD Initiated Contact with Patient 10/11/15 0032     Chief Complaint  Patient presents with  . Suicidal     (Consider location/radiation/quality/duration/timing/severity/associated sxs/prior Treatment) Patient is a 20 y.o. male presenting with mental health disorder. The history is provided by the patient. No language interpreter was used.  Mental Health Problem Presenting symptoms: suicidal thoughts   Degree of incapacity (severity):  Moderate Onset quality:  Gradual Timing:  Constant Progression:  Worsening Chronicity:  New Treatment compliance:  Untreated Relieved by:  Nothing Worsened by:  Nothing tried Risk factors: recent psychiatric admission   Pt reports he planned to cut his throat with scissors today Pt asked police to bring him to the hospital.  Pt atates he needs to be in the hospital.  Pt does not want to go [facility] Past Medical History  Diagnosis Date  . Asthma   . Seizures ([place])   . Brain ventricular shunt obstruction     hydrochelpis  . Depression   . Bipolar disorder [facility]    Past Surgical History  Procedure Laterality Date  . Tonsillectomy    . Ventriculo-peritoneal shunt placement / laparoscopic insertion peritoneal catheter     Family History  Problem Relation Age of Onset  . Hypertension Mother   . Mental illness Neg Hx    Social History  Substance Use Topics  . Smoking status: Current Every Day Smoker -- 1.00 packs/day    Types: Cigarettes  . Smokeless tobacco: None  . Alcohol Use: 7.2 oz/week    12 Cans of beer per week     Comment: 4-8 40 oz.on the weekend    Review of Systems  Psychiatric/Behavioral: Positive for suicidal ideas.  All other systems reviewed and are negative.     Allergies  Amoxicillin and Penicillins  Home Medications   Prior to Admission medications   Medication Sig Start Date End Date Taking? Authorizing Provider  ARIPiprazole (ABILIFY)  10 MG tablet Take 1 tablet (10 mg total) by mouth daily. 09/13/15   Derrill Center, NP  hydrOXYzine (ATARAX/VISTARIL) 25 MG tablet Take 1 tablet (25 mg total) by mouth every 6 (six) hours as needed for anxiety. 09/13/15   Derrill Center, NP  lithium carbonate 300 MG capsule Take 1 capsule (300 mg total) by mouth daily. 09/13/15   Derrill Center, NP  lithium carbonate 600 MG capsule Take 1 capsule (600 mg total) by mouth at bedtime. 09/13/15   Derrill Center, NP   BP 130/74 mmHg  Pulse 107  Temp(Src) 98.5 F (36.9 C) (Oral)  Resp 18  Ht 5\' 3"  (1.6 m)  Wt 108.863 kg  BMI 42.52 kg/m2  SpO2 97% Physical Exam  Constitutional: He is oriented to person, place, and time. He appears well-developed and well-nourished.  HENT:  Head: Normocephalic and atraumatic.  Nose: Nose normal.  Mouth/Throat: Oropharynx is clear and moist.  Eyes: Conjunctivae and EOM are normal. Pupils are equal, round, and reactive to light.  Neck: Normal range of motion.  Cardiovascular: Normal rate and normal heart sounds.   Pulmonary/Chest: Effort normal.  Abdominal: He exhibits no distension.  Musculoskeletal: Normal range of motion.  Neurological: He is alert and oriented to person, place, and time.  Skin: Skin is warm.  Psychiatric: He has a normal mood and affect.  Nursing note and vitals reviewed.   ED Course  Procedures (including critical care time) Labs Review  Labs Reviewed  COMPREHENSIVE METABOLIC PANEL  ETHANOL  SALICYLATE LEVEL  ACETAMINOPHEN LEVEL  CBC  URINE RAPID DRUG SCREEN, HOSP PERFORMED  LITHIUM LEVEL    Imaging Review No results found. I have personally reviewed and evaluated these images and lab results as part of my medical decision-making.   EKG Interpretation None      MDM Pt assessed by TSS. Pt meets criterai for admission   Final diagnoses:  Depression  Suicidal thoughts        Fransico Meadow, Hershal Coria 10/11/15 0138  April Palumbo, MD 10/11/15 0151

## 2015-10-11 NOTE — ED Notes (Signed)
Pt has been calm, cooperative and pleasant today. He continues to want to kill himself, and said, "I have just had enough."  He said that there are all kinds of ways that he can do it, and according to pt he has made some attempts recently.

## 2015-10-11 NOTE — BH Assessment (Signed)
Patient accepted to [facility] 403-2 per Brynda Peon, RN, [facility].  Call nurse report to [phone]. Patient is voluntary and will need to sign ROI and voluntary consent to treat.   Rosalin Hawking, LCSW Therapeutic Triage Specialist [place] 10/11/2015 1:31 PM

## 2015-10-11 NOTE — Tx Team (Signed)
Initial Interdisciplinary Treatment Plan   PATIENT STRESSORS: Medication change or noncompliance   PATIENT STRENGTHS: Capable of independent living Work skills   PROBLEM LIST: Problem List/Patient Goals Date to be addressed Date deferred Reason deferred Estimated date of resolution  Depression 10/11/15     Auditory hallucinations 10/11/15     Suicidal ideation 10/11/15     "I don't know" 10/11/15     "this is the way they sent me" 10/11/15                              DISCHARGE CRITERIA:  Medical problems require only outpatient monitoring Verbal commitment to aftercare and medication compliance  PRELIMINARY DISCHARGE PLAN: Outpatient therapy Medication managment  PATIENT/FAMIILY INVOLVEMENT: This treatment plan has been presented to and reviewed with the patient, Justin Chaney.  The patient and family have been given the opportunity to ask questions and make suggestions.  [place] 10/11/2015, 8:12 PM

## 2015-10-11 NOTE — BHH Counselor (Signed)
Patient reviewed and signed voluntary consent to treat and ROI to no one.  Patient denies any questions or concerns at this time. Patients nurse provided paperwork to transport with patient. Patient paperwork faxed to [phone] at [facility].    Rosalin Hawking, LCSW Therapeutic Triage Specialist [place] 10/11/2015 1:51 PM

## 2015-10-11 NOTE — ED Notes (Signed)
Staffing notified of need for sitter.  

## 2015-10-11 NOTE — Consult Note (Signed)
[facility] Face-to-Face Psychiatry Consult   Reason for Consult:  Suicidal ideation Referring Physician:  ED Patient Identification: Justin Chaney MRN:  [id number] Principal Diagnosis: MDD (major depressive disorder), recurrent, severe, with psychosis ([place]) Diagnosis:   Patient Active Problem List   Diagnosis Date Noted  . MDD (major depressive disorder), recurrent, severe, with psychosis ([place]) [F33.3] 10/11/2015  . Intermittent explosive disorder [F63.81] 09/05/2015  . Schizoaffective disorder, bipolar type ([place]) [F25.0] 09/04/2015  . Non compliance w medication regimen [Z91.14] 01/17/2015  . Substance induced mood disorder ([place]) [F19.94] 02/08/2014  . 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 Cannabis abuse [F12.10] 08/15/2012    Total Time spent with patient: 30 minutes  Subjective:   Justin Chaney is a 20 y.o. male patient admitted to [facility] related to complaints of suicidal ideation.   HPI:  Oral Remache 20 yr old black male reports that he is came to the hospital because "of my major depression and suicidal thoughts.  I was having suicidal thoughts all day yesterday.  I thought about cutting my throat.  I thought about all kinds of ways to kill my self.  Patient states that he was discharged for [facility] one month a ago.  States that he was suppose to follow up with [facility] for forgot about his follow up appointment.  States that he lives with his mother.  States that he has feelings of hopelessness and worthlessness  Past Psychiatric History: Schizoaffective Disorder and Bipolar Disorder.  Discharged [facility] 09/13/15.  Was to follow up [place] 10/06/15  Risk to Self: Suicidal Ideation: Yes-Currently Present Suicidal Intent: Yes-Currently Present Is patient at risk for suicide?: Yes Suicidal Plan?: Yes-Currently Present Specify Current Suicidal Plan: Pt reported that he tried to cut his neck with scissors.  Access to Means: Yes Specify  Access to Suicidal Means: access to scissors  What has been your use of drugs/alcohol within the last 12 months?: Amphetamine and cocaine  use reported.  How many times?: 7 (Multiple ) Other Self Harm Risks: Pt denies  Triggers for Past Attempts: Unpredictable Intentional Self Injurious Behavior: None Risk to Others: Homicidal Ideation: No Thoughts of Harm to Others: No Comment - Thoughts of Harm to Others: Pt denies  Current Homicidal Intent: No Current Homicidal Plan: No Describe Current Homicidal Plan: Pt denies  Access to Homicidal Means: No Describe Access to Homicidal Means: Pt denies  Identified Victim: Pt denies  History of harm to others?: No Assessment of Violence: None Noted Violent Behavior Description: No violent behaviors observed. Pt is calm and cooperative at this time.  Does patient have access to weapons?: No Criminal Charges Pending?: No Does patient have a court date: No Prior Inpatient Therapy: Prior Inpatient Therapy: Yes Prior Therapy Dates: 2017, 2016, 2015 Prior Therapy Facilty/Provider(s): [facility], [place], [place], [facility]  Reason for Treatment: Bipolar, Depression and Substance Abuse  Prior Outpatient Therapy: Prior Outpatient Therapy: No Prior Therapy Dates: None Reported Prior Therapy Facilty/Provider(s): None Reported Reason for Treatment: NA Does patient have an ACCT team?: No Does patient have Intensive In-House Services?  : No Does patient have [facility] services? : No Does patient have P4CC services?: No  Past Medical History:  Past Medical History  Diagnosis Date  . Asthma   . Seizures ([place])   . Brain ventricular shunt obstruction     hydrochelpis  . Depression   . Bipolar disorder [facility])     Past Surgical History  Procedure Laterality Date  . Tonsillectomy    .  Ventriculo-peritoneal shunt placement / laparoscopic insertion peritoneal catheter     Family History:  Family History  Problem Relation Age of Onset  . Hypertension Mother   .  Mental illness Neg Hx    Family Psychiatric  History: Denies Social History:  History  Alcohol Use  . 7.2 oz/week  . 12 Cans of beer per week    Comment: 4-8 40 oz.on the weekend     History  Drug Use  . Yes  . Special: Marijuana, Other-see comments, Amphetamines    Comment: Adderall, "I don't do weed, only rarely"    Social History   Social History  . Marital Status: Single    Spouse Name: N/A  . Number of Children: N/A  . Years of Education: N/A   Social History Main Topics  . Smoking status: Current Every Day Smoker -- 1.00 packs/day    Types: Cigarettes  . Smokeless tobacco: None  . Alcohol Use: 7.2 oz/week    12 Cans of beer per week     Comment: 4-8 40 oz.on the weekend  . Drug Use: Yes    Special: Marijuana, Other-see comments, Amphetamines     Comment: Adderall, "I don't do weed, only rarely"  . Sexual Activity: Not Asked   Other Topics Concern  . None   Social History Narrative   Additional Social History:    Allergies:   Allergies  Allergen Reactions  . Amoxicillin Hives  . Penicillins Hives and Rash    Has patient had a PCN reaction causing immediate rash, facial/tongue/throat swelling, SOB or lightheadedness with hypotension: unknown Has patient had a PCN reaction causing severe rash involving mucus membranes or skin necrosis: Yes Has patient had a PCN reaction that required hospitalization :unknown Has patient had a PCN reaction occurring within the last 10 years: unknown If all of the above answers are "NO", then may proceed with Cephalosporin use.     Labs:  Results for orders placed or performed during the hospital encounter of 10/10/15 (from the past 48 hour(s))  Urine rapid drug screen (hosp performed) (Not at [facility])     Status: Abnormal   Collection Time: 10/11/15  1:06 AM  Result Value Ref Range   Opiates NONE DETECTED NONE DETECTED   Cocaine POSITIVE (A) NONE DETECTED   Benzodiazepines NONE DETECTED NONE DETECTED   Amphetamines  POSITIVE (A) NONE DETECTED   Tetrahydrocannabinol NONE DETECTED NONE DETECTED   Barbiturates NONE DETECTED NONE DETECTED    Comment:        DRUG SCREEN FOR MEDICAL PURPOSES ONLY.  IF CONFIRMATION IS NEEDED FOR ANY PURPOSE, NOTIFY LAB WITHIN 5 DAYS.        LOWEST DETECTABLE LIMITS FOR URINE DRUG SCREEN Drug Class       Cutoff (ng/mL) Amphetamine      1000 Barbiturate      200 Benzodiazepine   174 Tricyclics       081 Opiates          300 Cocaine          300 THC              50   Comprehensive metabolic panel     Status: Abnormal   Collection Time: 10/11/15  1:09 AM  Result Value Ref Range   Sodium 139 135 - 145 mmol/L   Potassium 3.6 3.5 - 5.1 mmol/L   Chloride 106 101 - 111 mmol/L   CO2 24 22 - 32 mmol/L   Glucose, Bld 94 65 -  99 mg/dL   BUN 13 6 - 20 mg/dL   Creatinine, Ser 1.07 0.61 - 1.24 mg/dL   Calcium 9.3 8.9 - 10.3 mg/dL   Total Protein 7.6 6.5 - 8.1 g/dL   Albumin 4.5 3.5 - 5.0 g/dL   AST 79 (H) 15 - 41 U/L   ALT 51 17 - 63 U/L   Alkaline Phosphatase 83 38 - 126 U/L   Total Bilirubin 0.5 0.3 - 1.2 mg/dL   GFR calc non Af Amer >60 >60 mL/min   GFR calc Af Amer >60 >60 mL/min    Comment: (NOTE) The eGFR has been calculated using the CKD EPI equation. This calculation has not been validated in all clinical situations. eGFR's persistently <60 mL/min signify possible Chronic Kidney Disease.    Anion gap 9 5 - 15  Ethanol (ETOH)     Status: None   Collection Time: 10/11/15  1:09 AM  Result Value Ref Range   Alcohol, Ethyl (B) <5 <5 mg/dL    Comment:        LOWEST DETECTABLE LIMIT FOR SERUM ALCOHOL IS 5 mg/dL FOR MEDICAL PURPOSES ONLY   Salicylate level     Status: None   Collection Time: 10/11/15  1:09 AM  Result Value Ref Range   Salicylate Lvl <5.1 2.8 - 30.0 mg/dL  Acetaminophen level     Status: Abnormal   Collection Time: 10/11/15  1:09 AM  Result Value Ref Range   Acetaminophen (Tylenol), Serum <10 (L) 10 - 30 ug/mL    Comment:         THERAPEUTIC CONCENTRATIONS VARY SIGNIFICANTLY. A RANGE OF 10-30 ug/mL MAY BE AN EFFECTIVE CONCENTRATION FOR MANY PATIENTS. HOWEVER, SOME ARE BEST TREATED AT CONCENTRATIONS OUTSIDE THIS RANGE. ACETAMINOPHEN CONCENTRATIONS >150 ug/mL AT 4 HOURS AFTER INGESTION AND >50 ug/mL AT 12 HOURS AFTER INGESTION ARE OFTEN ASSOCIATED WITH TOXIC REACTIONS.   CBC     Status: None   Collection Time: 10/11/15  1:09 AM  Result Value Ref Range   WBC 9.3 4.0 - 10.5 K/uL   RBC 5.58 4.22 - 5.81 MIL/uL   Hemoglobin 15.8 13.0 - 17.0 g/dL   HCT 47.0 39.0 - 52.0 %   MCV 84.2 78.0 - 100.0 fL   MCH 28.3 26.0 - 34.0 pg   MCHC 33.6 30.0 - 36.0 g/dL   RDW 12.0 11.5 - 15.5 %   Platelets 261 150 - 400 K/uL  Lithium level     Status: Abnormal   Collection Time: 10/11/15  1:09 AM  Result Value Ref Range   Lithium Lvl <0.06 (L) 0.60 - 1.20 mmol/L    Current Facility-Administered Medications  Medication Dose Route Frequency Provider Last Rate Last Dose  . acetaminophen (TYLENOL) tablet 650 mg  650 mg Oral Q4H PRN Fransico Meadow, PA-C      . alum & mag hydroxide-simeth (MAALOX/MYLANTA) 200-200-20 MG/5ML suspension 30 mL  30 mL Oral PRN Fransico Meadow, PA-C      . LORazepam (ATIVAN) tablet 1 mg  1 mg Oral Q8H PRN Fransico Meadow, PA-C      . nicotine (NICODERM CQ - dosed in mg/24 hours) patch 21 mg  21 mg Transdermal Daily Fransico Meadow, PA-C   21 mg at 10/11/15 1124  . ondansetron (ZOFRAN) tablet 4 mg  4 mg Oral Q8H PRN Fransico Meadow, PA-C      . zolpidem [facility]) tablet 5 mg  5 mg Oral QHS PRN Fransico Meadow, PA-C  Current Outpatient Prescriptions  Medication Sig Dispense Refill  . lithium carbonate 300 MG capsule Take 1 capsule (300 mg total) by mouth daily. 30 capsule 0  . ARIPiprazole (ABILIFY) 10 MG tablet Take 1 tablet (10 mg total) by mouth daily. (Patient not taking: Reported on 10/11/2015) 30 tablet 0  . hydrOXYzine (ATARAX/VISTARIL) 25 MG tablet Take 1 tablet (25 mg total) by mouth every 6  (six) hours as needed for anxiety. (Patient not taking: Reported on 10/11/2015) 30 tablet 0  . lithium carbonate 600 MG capsule Take 1 capsule (600 mg total) by mouth at bedtime. (Patient not taking: Reported on 10/11/2015) 30 capsule 0    Musculoskeletal: Strength & Muscle Tone: within normal limits Gait & Station: normal Patient leans: N/A  Psychiatric Specialty Exam: Review of Systems  Psychiatric/Behavioral: Positive for depression, suicidal ideas and substance abuse. Hallucinations: Denies. The patient is not nervous/anxious.   All other systems reviewed and are negative.   Blood pressure 94/53, pulse 104, temperature 97.9 F (36.6 C), temperature source Oral, resp. rate 16, height _0  (1.6 m), weight 108.863 kg (240 lb), SpO2 99 %.Body mass index is 42.52 kg/(m^2).  General Appearance: Disheveled  Eye Contact::  Good  Speech:  Clear and Coherent and Normal Rate  Volume:  Normal  Mood:  Depressed  Affect:  Depressed  Thought Process:  Circumstantial  Orientation:  Full (Time, Place, and Person)  Thought Content:  Rumination  Suicidal Thoughts:  Yes.  with intent/plan  Homicidal Thoughts:  No  Memory:  Immediate;   Fair Recent;   Fair Remote;   Fair  Judgement:  Fair  Insight:  Lacking and Shallow  Psychomotor Activity:  Normal  Concentration:  Fair  Recall:  [employer] of Knowledge:Fair  Language: Fair  Akathisia:  No  Handed:  Right  AIMS (if indicated):     Assets:  Communication Skills Desire for Improvement  ADL's:  Intact  Cognition: WNL  Sleep:      Treatment Plan Summary: Daily contact with patient to assess and evaluate symptoms and progress in treatment, Medication management and Plan Recommend Inpatient psych treatment  Disposition: Recommend psychiatric Inpatient admission when medically cleared.   Patient accepted by [facility] 403/2  Earleen Newport, NP 10/11/2015 3:27 PM  Patient seen face-to-face for psychiatric evaluation, case discussed with  physician extender in treatment team. Formulated and developed treatment plan.Reviewed the information documented and agree with the treatment plan.  Jullia Mulligan,JANARDHAHA R. 10/11/2015 4:19 PM

## 2015-10-11 NOTE — BH Assessment (Addendum)
Tele Assessment Note   Justin Chaney is an 20 y.o. male presenting to [place] voluntarily reporting suicidal ideations. Pt stated "I am not doing good". "I have been attempting suicide all day". "I tried to cut my throat and I did other things I don't want to talk about". Pt reported that he has attempted suicide multiple times in the past  and has had several psychiatric admissions. Pt is currently not receiving any mental health treatment and reported that he has been off of his medication approximately 3 weeks. Pt did not provide an explanation as to why he did not follow up with aftercare. Pt is also endorsing auditory hallucinations but stated "I hear voices but they don't bother me". Pt denies HI at this time. Pt is endorsing multiple depressive symptoms and stated that his sleep has not been good and reported waking up multiple times throughout the night. Pt reported that he uses amphetamines and cocaine; however he provide vague responses regarding his use history.   Diagnosis: Schizoaffective Disorder, bipolar type   Past Medical History:  Past Medical History  Diagnosis Date  . Asthma   . Seizures ([place])   . Brain ventricular shunt obstruction     hydrochelpis  . Depression   . Bipolar disorder [facility])     Past Surgical History  Procedure Laterality Date  . Tonsillectomy    . Ventriculo-peritoneal shunt placement / laparoscopic insertion peritoneal catheter      Family History:  Family History  Problem Relation Age of Onset  . Hypertension Mother   . Mental illness Neg Hx     Social History:  reports that he has been smoking Cigarettes.  He has been smoking about 1.00 pack per day. He does not have any smokeless tobacco history on file. He reports that he drinks about 7.2 oz of alcohol per week. He reports that he uses illicit drugs (Marijuana, Other-see comments, and Amphetamines).  Additional Social History:  Alcohol / Drug Use History of alcohol / drug use?: Yes Substance  #1 Name of Substance 1: Cocaine  1 - Age of First Use: unk 1 - Amount (size/oz): unk 1 - Frequency: umk 1 - Duration: unk 1 - Last Use / Amount: 1 line of cocaine  Substance #2 Name of Substance 2: Amphetaminess 2 - Age of First Use: 16 2 - Amount (size/oz): "I don't know" 2 - Frequency: "I don't know" 2 - Duration: unknown  2 - Last Use / Amount: "It's been a while"  CIWA: CIWA-Ar BP: 130/74 mmHg Pulse Rate: 107 COWS:    PATIENT STRENGTHS: (choose at least two) Average or above average intelligence Communication skills  Allergies:  Allergies  Allergen Reactions  . Amoxicillin Hives  . Penicillins Hives and Rash    Has patient had a PCN reaction causing immediate rash, facial/tongue/throat swelling, SOB or lightheadedness with hypotension: unknown Has patient had a PCN reaction causing severe rash involving mucus membranes or skin necrosis: Yes Has patient had a PCN reaction that required hospitalization :unknown Has patient had a PCN reaction occurring within the last 10 years: unknown If all of the above answers are "NO", then may proceed with Cephalosporin use.     Home Medications:  (Not in a hospital admission)  OB/GYN Status:  No LMP for male patient.  General Assessment Data [facility] of Assessment: [facility] TTS Assessment: In system Is this a Tele or Face-to-Face Assessment?: Face-to-Face Is this an Initial Assessment or a Re-assessment for this encounter?: Initial Assessment Marital status:  Single Living Arrangements: Non-relatives/Friends Can pt return to current living arrangement?: Yes Admission Status: Voluntary Is patient capable of signing voluntary admission?: Yes Referral Source: Self/Family/Friend Insurance type: [place] Living Arrangements: Non-relatives/Friends Name of Psychiatrist: None Reported Name of Therapist: None Reported  Education Status Is patient currently in school?: No Current Grade: N/A Highest grade of  school patient has completed: [place] Name of school: N/A Contact person: N/A  Risk to self with the past 6 months Suicidal Ideation: Yes-Currently Present Has patient been a risk to self within the past 6 months prior to admission? : Yes Suicidal Intent: Yes-Currently Present Has patient had any suicidal intent within the past 6 months prior to admission? : Yes Is patient at risk for suicide?: Yes Suicidal Plan?: Yes-Currently Present Has patient had any suicidal plan within the past 6 months prior to admission? : Yes Specify Current Suicidal Plan: Pt reported that he tried to cut his neck with scissors.  Access to Means: Yes Specify Access to Suicidal Means: access to scissors  What has been your use of drugs/alcohol within the last 12 months?: Amphetamine and cocaine  use reported.  Previous Attempts/Gestures: Yes How many times?: 7 (Multiple ) Other Self Harm Risks: Pt denies  Triggers for Past Attempts: Unpredictable Intentional Self Injurious Behavior: None Family Suicide History: Unknown Recent stressful life event(s): Conflict (Comment) Persecutory voices/beliefs?: Yes Depression: Yes Depression Symptoms: Despondent, Isolating, Fatigue, Feeling worthless/self pity, Loss of interest in usual pleasures, Feeling angry/irritable Substance abuse history and/or treatment for substance abuse?: Yes Suicide prevention information given to non-admitted patients: Not applicable  Risk to Others within the past 6 months Homicidal Ideation: No Does patient have any lifetime risk of violence toward others beyond the six months prior to admission? : No Thoughts of Harm to Others: No Comment - Thoughts of Harm to Others: Pt denies  Current Homicidal Intent: No Current Homicidal Plan: No Describe Current Homicidal Plan: Pt denies  Access to Homicidal Means: No Describe Access to Homicidal Means: Pt denies  Identified Victim: Pt denies  History of harm to others?: No Assessment of Violence:  None Noted Violent Behavior Description: No violent behaviors observed. Pt is calm and cooperative at this time.  Does patient have access to weapons?: No Criminal Charges Pending?: No Does patient have a court date: No Is patient on probation?: No  Psychosis Hallucinations: Auditory ("I hear voices but they don't bother me" ) Delusions: None noted  Mental Status Report Appearance/Hygiene: In scrubs Eye Contact: Poor Motor Activity: Freedom of movement Speech: Logical/coherent Level of Consciousness: Quiet/awake Mood: Depressed Affect: Depressed Anxiety Level: Minimal Thought Processes: Coherent, Relevant Judgement: Unimpaired Orientation: Person, Place, Time, Situation Obsessive Compulsive Thoughts/Behaviors: None  Cognitive Functioning Concentration: Decreased Memory: Remote Intact, Recent Intact IQ: Average Insight: Poor Impulse Control: Poor Appetite: Fair Weight Loss: 0 Weight Gain: 0 Sleep: Decreased Total Hours of Sleep: 3 Vegetative Symptoms: None  ADLScreening [facility] Assessment Services) Patient's cognitive ability adequate to safely complete daily activities?: Yes Patient able to express need for assistance with ADLs?: Yes Independently performs ADLs?: Yes (appropriate for developmental age)  Prior Inpatient Therapy Prior Inpatient Therapy: Yes Prior Therapy Dates: 2017, 2016, 2015 Prior Therapy Facilty/Provider(s): [facility], [place], [place], [facility]  Reason for Treatment: Bipolar, Depression and Substance Abuse   Prior Outpatient Therapy Prior Outpatient Therapy: No Prior Therapy Dates: None Reported Prior Therapy Facilty/Provider(s): None Reported Reason for Treatment: NA Does patient have an ACCT team?: No Does patient have Intensive  In-House Services?  : No Does patient have [facility] services? : No Does patient have P4CC services?: No  ADL Screening (condition at time of admission) Patient's cognitive ability adequate to safely complete daily  activities?: Yes Is the patient deaf or have difficulty hearing?: No Does the patient have difficulty seeing, even when wearing glasses/contacts?: No Does the patient have difficulty concentrating, remembering, or making decisions?: No Patient able to express need for assistance with ADLs?: Yes Does the patient have difficulty dressing or bathing?: No Independently performs ADLs?: Yes (appropriate for developmental age)       Abuse/Neglect Assessment (Assessment to be complete while patient is alone) Physical Abuse: Denies Verbal Abuse: Denies Sexual Abuse: Denies Exploitation of patient/patient's resources: Denies Self-Neglect: Denies     Regulatory affairs officer (For Healthcare) Does patient have an advance directive?: No Would patient like information on creating an advanced directive?: No - patient declined information    Additional Information 1:1 In Past 12 Months?: No CIRT Risk: No Elopement Risk: No Does patient have medical clearance?: Yes     Disposition:  Disposition Initial Assessment Completed for this Encounter: Yes Disposition of Patient: Inpatient treatment program Type of inpatient treatment program: Adult  Marquett Bertoli S 10/11/2015 1:09 AM

## 2015-10-12 ENCOUNTER — Encounter (HOSPITAL_COMMUNITY): Payer: Self-pay | Admitting: Registered Nurse

## 2015-10-12 DIAGNOSIS — F331 Major depressive disorder, recurrent, moderate: Secondary | ICD-10-CM

## 2015-10-12 DIAGNOSIS — F1410 Cocaine abuse, uncomplicated: Secondary | ICD-10-CM | POA: Insufficient documentation

## 2015-10-12 DIAGNOSIS — F259 Schizoaffective disorder, unspecified: Secondary | ICD-10-CM | POA: Insufficient documentation

## 2015-10-12 NOTE — BHH Group Notes (Signed)
[place] Group Notes:  (Nursing/MHT/Case Management/Adjunct)  Date:  10/12/2015  Time:  1400  Type of Therapy:  Nurse Education  Life SKills:  The group focuses on teaching patients how to identify healthy [place].  Participation Level:  Did Not Attend  Participation Quality:  n/a  Affect:  N/a  Cognitive: n/a   Insight:  n/a  Engagement in Group:  n/a  Modes of Intervention:  n/a  Summary of Progress/Problems:  Lauralyn Primes 10/12/2015, 3:55 PM

## 2015-10-12 NOTE — H&P (Signed)
Psychiatric Admission Assessment Adult  Patient Identification: Justin Chaney MRN:  [id number] Date of Evaluation:  10/12/2015 Chief Complaint:  SCHIZOAFFECTIVE DISORDER Principal Diagnosis: MDD (major depressive disorder) ([place]) Diagnosis:   Patient Active Problem List   Diagnosis Date Noted  . Cocaine abuse [F14.10]   . MDD (major depressive disorder), recurrent, severe, with psychosis ([place]) [F33.3] 10/11/2015  . MDD (major depressive disorder) ([place]) [F32.9] 10/11/2015  . Suicidal thoughts [R45.851]   . Intermittent explosive disorder [F63.81] 09/05/2015  . Schizoaffective disorder, bipolar type ([place]) [F25.0] 09/04/2015  . Non compliance w medication regimen [Z91.14] 01/17/2015  . Substance induced mood disorder ([place]) [F19.94] 02/08/2014  . 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 Cannabis abuse [F12.10] 08/15/2012   History of Present Illness:: [name] [age] yr old black male admitted to [facility] after presenting to [facility] with complaints of suicidal ideation with a plan to cut his throat and thinking of multiple other ways that he could kill himself.  Patient recently discharged for [facility] 09/13/15 and did not follow up with outpatient services [place] 10/06/15.  Today the patient is in the bed and refuses to get up.  Patient is agitated and states "I just got through talking to that other lady.  Why I got to talk to you to?"  Explained to patient that he would be spoken with on several occasions during his admission and his stay at the hospital and that everyone played different roles in his care; but we would all have to speak to him to make sure we were providing the care that would best benefit him.  Patient states that he does not want to talk.  Spoke with Tech, Nursing, and Social Work who all reports that patient has not been participating in group session, refusing his medications, and not answering any questions and has been rude to  everyone.  Reviewed notes from patient last admission and shows the same pattern of patient refusing medications, group sessions.     Associated Signs/Symptoms: Depression Symptoms:  depressed mood, irritable  (Hypo) Manic Symptoms:  Impulsivity, Irritable Mood, Anxiety Symptoms:  Unable to assess at this time.  Patient refusing to talk Psychotic Symptoms:  Patient does not appear to be responding to internal or external stimuli PTSD Symptoms: Unable to assess at this time patient refuses to talk Total Time spent with patient: 30 minutes  Past Psychiatric History: Schizoaffective Disorder and Bipolar Disorder. Discharged [facility] 09/13/15. Was to follow up [place] 10/06/15  Is the patient at risk to self? No.  Has the patient been a risk to self in the past 6 months? Yes.    Has the patient been a risk to self within the distant past? Yes.    Is the patient a risk to others? No.  Has the patient been a risk to others in the past 6 months? No.  Has the patient been a risk to others within the distant past? No.   Prior Inpatient Therapy:   Prior Outpatient Therapy:    Alcohol Screening: Patient refused Alcohol Screening Tool: Yes 1. How often do you have a drink containing alcohol?: Never 9. Have you or someone else been injured as a result of your drinking?: No 10. Has a relative or friend or a doctor or another health worker been concerned about your drinking or suggested you cut down?: No Alcohol Use Disorder Identification Test Final Score (AUDIT): 0 Brief Intervention: AUDIT score less than 7 or less-screening does  not suggest unhealthy drinking-brief intervention not indicated Substance Abuse History in the last 12 months:  Yes.   Consequences of Substance Abuse: Family Consequences:  Family discord Previous Psychotropic Medications: Yes  Psychological Evaluations: No  Past Medical History:  Past Medical History  Diagnosis Date  . Asthma   . Seizures  ([place])   . Brain ventricular shunt obstruction     hydrochelpis  . Depression   . Bipolar disorder [facility])     Past Surgical History  Procedure Laterality Date  . Tonsillectomy    . Ventriculo-peritoneal shunt placement / laparoscopic insertion peritoneal catheter     Family History:  Family History  Problem Relation Age of Onset  . Hypertension Mother   . Mental illness Neg Hx    Family Psychiatric  History: Unable to assess at this time.  Patient refusing to talk Tobacco Screening: _0 [phone])::1)@ Social History:  History  Alcohol Use  . 7.2 oz/week  . 12 Cans of beer per week    Comment: 4-8 40 oz.on the weekend     History  Drug Use  . Yes  . Special: Marijuana, Other-see comments, Amphetamines    Comment: Adderall, "I don't do weed, only rarely"    Additional Social History:  Allergies:   Allergies  Allergen Reactions  . Amoxicillin Hives  . Penicillins Hives and Rash    Has patient had a PCN reaction causing immediate rash, facial/tongue/throat swelling, SOB or lightheadedness with hypotension: unknown Has patient had a PCN reaction causing severe rash involving mucus membranes or skin necrosis: Yes Has patient had a PCN reaction that required hospitalization :unknown Has patient had a PCN reaction occurring within the last 10 years: unknown If all of the above answers are "NO", then may proceed with Cephalosporin use.    Lab Results:  Results for orders placed or performed during the hospital encounter of 10/10/15 (from the past 48 hour(s))  Urine rapid drug screen (hosp performed) (Not at [facility])     Status: Abnormal   Collection Time: 10/11/15  1:06 AM  Result Value Ref Range   Opiates NONE DETECTED NONE DETECTED   Cocaine POSITIVE (A) NONE DETECTED   Benzodiazepines NONE DETECTED NONE DETECTED   Amphetamines POSITIVE (A) NONE DETECTED   Tetrahydrocannabinol NONE DETECTED NONE DETECTED   Barbiturates NONE DETECTED NONE DETECTED    Comment:         DRUG SCREEN FOR MEDICAL PURPOSES ONLY.  IF CONFIRMATION IS NEEDED FOR ANY PURPOSE, NOTIFY LAB WITHIN 5 DAYS.        LOWEST DETECTABLE LIMITS FOR URINE DRUG SCREEN Drug Class       Cutoff (ng/mL) Amphetamine      1000 Barbiturate      200 Benzodiazepine   017 Tricyclics       494 Opiates          300 Cocaine          300 THC              50   Comprehensive metabolic panel     Status: Abnormal   Collection Time: 10/11/15  1:09 AM  Result Value Ref Range   Sodium 139 135 - 145 mmol/L   Potassium 3.6 3.5 - 5.1 mmol/L   Chloride 106 101 - 111 mmol/L   CO2 24 22 - 32 mmol/L   Glucose, Bld 94 65 - 99 mg/dL   BUN 13 6 - 20 mg/dL   Creatinine, Ser 1.07 0.61 - 1.24 mg/dL  Calcium 9.3 8.9 - 10.3 mg/dL   Total Protein 7.6 6.5 - 8.1 g/dL   Albumin 4.5 3.5 - 5.0 g/dL   AST 79 (H) 15 - 41 U/L   ALT 51 17 - 63 U/L   Alkaline Phosphatase 83 38 - 126 U/L   Total Bilirubin 0.5 0.3 - 1.2 mg/dL   GFR calc non Af Amer >60 >60 mL/min   GFR calc Af Amer >60 >60 mL/min    Comment: (NOTE) The eGFR has been calculated using the CKD EPI equation. This calculation has not been validated in all clinical situations. eGFR's persistently <60 mL/min signify possible Chronic Kidney Disease.    Anion gap 9 5 - 15  Ethanol (ETOH)     Status: None   Collection Time: 10/11/15  1:09 AM  Result Value Ref Range   Alcohol, Ethyl (B) <5 <5 mg/dL    Comment:        LOWEST DETECTABLE LIMIT FOR SERUM ALCOHOL IS 5 mg/dL FOR MEDICAL PURPOSES ONLY   Salicylate level     Status: None   Collection Time: 10/11/15  1:09 AM  Result Value Ref Range   Salicylate Lvl <1.7 2.8 - 30.0 mg/dL  Acetaminophen level     Status: Abnormal   Collection Time: 10/11/15  1:09 AM  Result Value Ref Range   Acetaminophen (Tylenol), Serum <10 (L) 10 - 30 ug/mL    Comment:        THERAPEUTIC CONCENTRATIONS VARY SIGNIFICANTLY. A RANGE OF 10-30 ug/mL MAY BE AN EFFECTIVE CONCENTRATION FOR MANY PATIENTS. HOWEVER, SOME ARE BEST  TREATED AT CONCENTRATIONS OUTSIDE THIS RANGE. ACETAMINOPHEN CONCENTRATIONS >150 ug/mL AT 4 HOURS AFTER INGESTION AND >50 ug/mL AT 12 HOURS AFTER INGESTION ARE OFTEN ASSOCIATED WITH TOXIC REACTIONS.   CBC     Status: None   Collection Time: 10/11/15  1:09 AM  Result Value Ref Range   WBC 9.3 4.0 - 10.5 K/uL   RBC 5.58 4.22 - 5.81 MIL/uL   Hemoglobin 15.8 13.0 - 17.0 g/dL   HCT 47.0 39.0 - 52.0 %   MCV 84.2 78.0 - 100.0 fL   MCH 28.3 26.0 - 34.0 pg   MCHC 33.6 30.0 - 36.0 g/dL   RDW 12.0 11.5 - 15.5 %   Platelets 261 150 - 400 K/uL  Lithium level     Status: Abnormal   Collection Time: 10/11/15  1:09 AM  Result Value Ref Range   Lithium Lvl <0.06 (L) 0.60 - 1.20 mmol/L    Blood Alcohol level:  Lab Results  Component Value Date   ETH <5 10/11/2015   ETH <5 49/44/9675    Metabolic Disorder Labs:  Lab Results  Component Value Date   HGBA1C 5.0 09/09/2015   MPG 97 09/09/2015   Lab Results  Component Value Date   PROLACTIN 8.0 09/09/2015   Lab Results  Component Value Date   CHOL 133 09/09/2015   TRIG 70 09/09/2015   HDL 35* 09/09/2015   CHOLHDL 3.8 09/09/2015   VLDL 14 09/09/2015   LDLCALC 84 09/09/2015    Current Medications: Current Facility-Administered Medications  Medication Dose Route Frequency Provider Last Rate Last Dose  . ARIPiprazole (ABILIFY) tablet 10 mg  10 mg Oral Daily Shuvon B Rankin, NP   10 mg at 10/12/15 0800  . hydrOXYzine (ATARAX/VISTARIL) tablet 25 mg  25 mg Oral TID PRN Shuvon B Rankin, NP      . lithium carbonate capsule 300 mg  300 mg Oral Daily Shuvon B  Rankin, NP   300 mg at 10/12/15 0800  . lithium carbonate capsule 600 mg  600 mg Oral QHS Shuvon B Rankin, NP   600 mg at 10/11/15 2207  . magnesium hydroxide (MILK OF MAGNESIA) suspension 30 mL  30 mL Oral Daily PRN Shuvon B Rankin, NP       PTA Medications: Prescriptions prior to admission  Medication Sig Dispense Refill Last Dose  . ARIPiprazole (ABILIFY) 10 MG tablet Take 1  tablet (10 mg total) by mouth daily. (Patient not taking: Reported on 10/11/2015) 30 tablet 0 Not Taking at Unknown time  . hydrOXYzine (ATARAX/VISTARIL) 25 MG tablet Take 1 tablet (25 mg total) by mouth every 6 (six) hours as needed for anxiety. (Patient not taking: Reported on 10/11/2015) 30 tablet 0 Not Taking  . lithium carbonate 300 MG capsule Take 1 capsule (300 mg total) by mouth daily. 30 capsule 0 Past Week at Unknown time  . lithium carbonate 600 MG capsule Take 1 capsule (600 mg total) by mouth at bedtime. (Patient not taking: Reported on 10/11/2015) 30 capsule 0 Not Taking at Unknown time    Musculoskeletal: Strength & Muscle Tone: within normal limits Gait & Station: normal Patient leans: N/A  Psychiatric Specialty Exam: Physical Exam  Constitutional: He is oriented to person, place, and time.  Neck: Normal range of motion.  Respiratory: Effort normal.  Musculoskeletal: Normal range of motion.  Neurological: He is alert and oriented to person, place, and time.  Skin: Skin is warm and dry.  Psychiatric: His speech is normal. He is agitated and withdrawn. He expresses impulsivity. He exhibits a depressed mood.    Review of Systems  Unable to perform ROS: other  Psychiatric/Behavioral: Positive for depression and substance abuse.       Unable to assess at this time.  Patient refusing to talk    Blood pressure 110/47, pulse 113, temperature 98.9 F (37.2 C), temperature source Oral, resp. rate 18, height _0  (1.6 m), weight 102.059 kg (225 lb), SpO2 97 %.Body mass index is 39.87 kg/(m^2).  General Appearance: Disheveled  Eye Sport and exercise psychologist::  None  Speech:  Clear and Coherent and Normal Rate  Volume:  Increased  Mood:  Irritable  Affect:  Depressed  Thought Process:  Patient is not talking  Orientation:  Full (Time, Place, and Person)  Thought Content:  Unable to assess at this time.  Patient refusing to talk; but does not appear to be responding to internal or external stimuli   Suicidal Thoughts:  Unable to assess at this time.  Patient refusing to talk  Homicidal Thoughts:  Unable to assess at this time.  Patient refusing to talk  Memory:  Unable to assess at this time.  Patient refusing to talk  Judgement:  Other:  Unable to assess at this time.  Patient refusing to talk  Insight:  Lacking and Shallow  Psychomotor Activity:  Normal  Concentration:  Unable to assess at this time.  Patient refusing to talk  Recall:  Unable to assess at this time.  Patient refusing to talk  [place] to assess at this time.  Patient refusing to talk  Language: Good  Akathisia:  No  Handed:  Right  AIMS (if indicated):     Assets:  Others:  Unable to assess at this time.  Patient refusing to talk  ADL's:  Intact  Cognition: WNL  Sleep:  Number of Hours: 4.75     Treatment Plan Summary: Daily contact with patient to  assess and evaluate symptoms and progress in treatment and Medication management  Observation Level/Precautions:  15 minute checks for safety  Laboratory:  CBC Chemistry Profile UDS UA  Reviewed labs WLED  Psychotherapy:  Individual and group sessions  Medications:  Will restart home medications Abilify 10 mg daily for Major Depression and Mood stabilization; Lithium 300 mg Q am and 600 mg Q hs for Mood Stabilization; Vistaril 25 mg Tid prn for Anxiety   Consultations:  As appropriate  Discharge Concerns:  Safety, stabilization, and access to medication  Estimated LOS: 5-7 days  Other:     If patient continues to refuse medications and does not participate in the milieu may consider discharge with outpatient services and community resources   I certify that inpatient services furnished can reasonably be expected to improve the patient's condition.    Earleen Newport, NP 3/12/201712:48 PM  Patient seen face to face for psychiatric evaluation. Chart reviewed and finding discussed with Physician extender. Agreed with disposition and treatment  plan.   Berniece Andreas, MD

## 2015-10-12 NOTE — BHH Counselor (Signed)
Pt was willing to get out of bed and meet with CSW in a separate room for attempted PSA.  Pt was very irritable with poor eye contact.  Pt did not want to answer any PSA questions, stating each question was too personal and had nothing to do with his reason for being here.  Pt did share that his reason for admission was attempted suicide and is depressed, and doesn't think medications will help.  Pt is unsure what would be helpful for him, which is why he's here.  CSW asked pt about his follow up at [facility] from last admission and pt states he never went.  Pt states he is a smoker and is not interested in [employer].  CSW did not attempt to get signatures on any consents due to irritability.  Pt did refuse contact with any collaterals so SPE provided with pt.  Weekday CSW to attempt PSA.    Theressa Millard, [place] 10/12/2015  10:54 AM

## 2015-10-12 NOTE — Plan of Care (Signed)
Problem: Diagnosis: Increased Risk For Suicide Attempt Goal: STG-Patient Will Comply With Medication Regime Outcome: Progressing Pt compliant with medication regime     

## 2015-10-12 NOTE — BHH Suicide Risk Assessment (Signed)
[facility] Admission Suicide Risk Assessment   Nursing information obtained from:    Demographic factors:    Current Mental Status:    Loss Factors:    Historical Factors:    Risk Reduction Factors:     Total Time spent with patient: 45 minutes Principal Problem: <principal problem not specified> Diagnosis:   Patient Active Problem List   Diagnosis Date Noted  . MDD (major depressive disorder), recurrent, severe, with psychosis ([place]) [F33.3] 10/11/2015  . MDD (major depressive disorder) ([place]) [F32.9] 10/11/2015  . Suicidal thoughts [R45.851]   . Intermittent explosive disorder [F63.81] 09/05/2015  . Schizoaffective disorder, bipolar type ([place]) [F25.0] 09/04/2015  . Non compliance w medication regimen [Z91.14] 01/17/2015  . Substance induced mood disorder ([place]) [F19.94] 02/08/2014  . 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 Cannabis abuse [F12.10] 08/15/2012   Subjective Data: Patient is 20 year old 59 man who is known to this hospital due to multiple hospitalization.  He was admitted due to worsening of depression and having plan to cut his throat.  He has a history of noncompliance with treatment and follow-up.  Patient is easily irritable and does not want to talk about his symptoms.  However he endorse feeling of hopelessness worthlessness, fatigue and anhedonia.  Continued Clinical Symptoms:  Alcohol Use Disorder Identification Test Final Score (AUDIT): 0 The "Alcohol Use Disorders Identification Test", Guidelines for Use in Primary Care, Second Edition.  World Pharmacologist [facility]). Score between 0-7:  no or low risk or alcohol related problems. Score between 8-15:  moderate risk of alcohol related problems. Score between 16-19:  high risk of alcohol related problems. Score 20 or above:  warrants further diagnostic evaluation for alcohol dependence and treatment.   CLINICAL FACTORS:   Depression:   Comorbid alcohol  abuse/dependence Hopelessness Severe   Musculoskeletal: Strength & Muscle Tone: within normal limits Gait & Station: normal Patient leans: N/A  Psychiatric Specialty Exam: ROS  Blood pressure 110/47, pulse 113, temperature 98.9 F (37.2 C), temperature source Oral, resp. rate 18, height 5\' 3"  (1.6 m), weight 102.059 kg (225 lb), SpO2 97 %.Body mass index is 39.87 kg/(m^2).  General Appearance: Casual and Guarded  Eye Contact::  Fair  Speech:  Slow  Volume:  Normal  Mood:  Depressed, Hopeless and Irritable  Affect:  Labile  Thought Process:  Coherent  Orientation:  Full (Time, Place, and Person)  Thought Content:  WDL  Suicidal Thoughts:  Yes.  with intent/plan  Homicidal Thoughts:  No  Memory:  Immediate;   Fair Recent;   Fair Remote;   Fair  Judgement:  Fair  Insight:  Fair  Psychomotor Activity:  Increased  Concentration:  Fair  Recall:  [employer] of Knowledge:Fair  Language: Good  Akathisia:  No  Handed:  Right  AIMS (if indicated):     Assets:  Communication Skills Desire for Improvement  Sleep:  Number of Hours: 4.75  Cognition: WNL  ADL's:  Intact    COGNITIVE FEATURES THAT CONTRIBUTE TO RISK:  Thought constriction (tunnel vision)    SUICIDE RISK:   Moderate:  Frequent suicidal ideation with limited intensity, and duration, some specificity in terms of plans, no associated intent, good self-control, limited dysphoria/symptomatology, some risk factors present, and identifiable protective factors, including available and accessible social support.  PLAN OF CARE: Patient is 20 year old African-American man who is known to this hospital due to multiple hospitalization.  Patient is noncompliant with medication and admitted to 2 severe depression and having suicidal thoughts  and plan to cut his throat.  Patient is noncompliant with his follow-ups.  His lithium level is less than therapeutic.  His UDS is positive for cocaine and amphetamine.  Patient requires  hospitalization and treatment.  Please see history and physical for more details and comprehensive treatment plan.  I certify that inpatient services furnished can reasonably be expected to improve the patient's condition.   Zair Borawski T., MD 10/12/2015, 12:18 PM

## 2015-10-12 NOTE — Progress Notes (Signed)
D Justin Chaney has been very slow to " warm up"... " to trust staff and to begin to become part of the program and milieu here. Initially, he demanded staff leave him alone and get out of his room. He refused his medications this morning. A He did get up at lunch. He came out to the nurses' station and demanded he be given his lunch and then would not accept his lunch from staff ( he wanted to be able to choose hs lunch himself). He was given his food...along with 2nd helping and then became calmer and returned back into his bed. A He has been calm and approachable this afternoon. R Safety is in place.

## 2015-10-12 NOTE — BHH Group Notes (Signed)
[place] LCSW Group Therapy  10/12/2015 10:00 AM   Type of Therapy:  Group Therapy  Participation Level:  Did Not Attend  Regan Lemming, LCSW 10/12/2015 11:18 AM

## 2015-10-12 NOTE — BHH Suicide Risk Assessment (Signed)
[facility] Adult Inpatient Family/Significant Other Suicide Prevention Education  Suicide Prevention Education:   Patient Refusal for Family/Significant Other Suicide Prevention Education: The patient has refused to provide written consent for family/significant other to be provided Family/Significant Other Suicide Prevention Education during admission and/or prior to discharge.  Physician notified.  CSW provided suicide prevention information with patient.    The suicide prevention education provided includes the following:  Suicide risk factors  Suicide prevention and interventions  National Suicide Hotline telephone number  [facility] assessment telephone number  [facility] Emergency Assistance [place] and/or Residential Mobile Crisis Unit telephone number   Theressa Millard, [place] 10/12/2015 10:49 AM

## 2015-10-12 NOTE — Progress Notes (Signed)
Patient ID: Justin Chaney, male   DOB: Oct 07, 1995, 20 y.o.   MRN: [id number] D: Patient seen in room resting. Denies HI/AVH and pain. Pt endorses passive suicidal ideation without a plan. Stated he will not hurt self while here. Made no new complaint.  A: Support and encouragement offered as needed. Pt encouraged to engage in milieu and group activities. Medications given as prescribed. No behavioral issues noted.  R: Patient cooperative and appropriate on unit. Contracted to come to staff before acting on harmful thoughts. Will continue to monitor patient for safety and stability.

## 2015-10-13 ENCOUNTER — Ambulatory Visit: Payer: Self-pay | Admitting: Family Medicine

## 2015-10-13 DIAGNOSIS — F322 Major depressive disorder, single episode, severe without psychotic features: Principal | ICD-10-CM

## 2015-10-13 MED ORDER — NICOTINE 21 MG/24HR TD PT24
21.0000 mg | MEDICATED_PATCH | Freq: Every day | TRANSDERMAL | Status: DC
  Administered 2015-10-14: 21 mg via TRANSDERMAL
  Filled 2015-10-13 (×8): qty 1

## 2015-10-13 NOTE — BHH Counselor (Signed)
CSW attempted to meet with Pt to complete PSA, however Pt stated he "has a lot going on right now and ain't talking to nobody."  CSW will attempt tomorrow.  [name], [place] Work [phone]

## 2015-10-13 NOTE — Plan of Care (Signed)
Problem: Alteration in mood Goal: LTG-Patient reports reduction in suicidal thoughts (Patient reports reduction in suicidal thoughts and is able to verbalize a safety plan for whenever patient is feeling suicidal)  Outcome: Progressing Nurse discussed depression/coping skills with patient.

## 2015-10-13 NOTE — Progress Notes (Signed)
Adult Psychoeducational Group Note  Date:  10/13/2015 Time:  8:25 PM  Group Topic/Focus:  Wrap-Up Group:   The focus of this group is to help patients review their daily goal of treatment and discuss progress on daily workbooks.  Participation Level:  Did Not Attend  Pt was asleep during wrap-up group.   Justin Chaney 10/13/2015, 8:53 PM

## 2015-10-13 NOTE — BHH Group Notes (Signed)
[place] LCSW Group Therapy  10/13/2015 1:15pm  Type of Therapy:  Group Therapy vercoming Obstacles  Pt did not attend, declined invitation.   [name], [place] 10/13/2015 3:44 PM

## 2015-10-13 NOTE — Tx Team (Signed)
Interdisciplinary Treatment Plan Update (Adult) Date: 10/13/2015   Date: 10/13/2015 9:31 AM  Progress in Treatment:  Attending groups: No  Participating in groups: No  Taking medication as prescribed: Yes  Tolerating medication: Yes  Family/Significant othe contact made: No, Pt declines Patient understands diagnosis: Continuing to assess Discussing patient identified problems/goals with staff: Yes  Medical problems stabilized or resolved: Yes  Denies suicidal/homicidal ideation: No, endorses passive SI Patient has not harmed self or Others: Yes   New problem(s) identified: None identified at this time.   Discharge Plan or Barriers: CSW will assess for appropriate discharge plan and relevant barriers.   Additional comments:  Patient and CSW reviewed pt's identified goals and treatment plan. Patient verbalized understanding and agreed to treatment plan. CSW reviewed [facility] "Discharge Process and Patient Involvement" Form. Pt verbalized understanding of information provided and signed form.   Reason for Continuation of Hospitalization:  Depression Medication stabilization Suicidal ideation  Estimated length of stay: 3-5 days  Review of initial/current patient goals per problem list:   1.  Goal(s): Patient will participate in aftercare plan  Met:  No  Target date: 3-5 days from date of admission   As evidenced by: Patient will participate within aftercare plan AEB aftercare provider and housing plan at discharge being identified.  10/13/15: CSW to work with Pt to assess for appropriate discharge plan and faciliate appointments and referrals as needed prior to d/c.  2.  Goal (s): Patient will exhibit decreased depressive symptoms and suicidal ideations.  Met:  No  Target date: 3-5 days from date of admission   As evidenced by: Patient will utilize self rating of depression at 3 or below and demonstrate decreased signs of depression or be deemed stable for discharge by  MD.  10/13/15: Pt rates depression at 10/10; endorses passive SI  Attendees:  Patient:    Family:    Physician: Dr. Parke Poisson, MD  10/13/2015 9:31 AM  Nursing: Lars Pinks, RN Case manager  10/13/2015 9:31 AM  Clinical Social Worker [name], [place] 10/13/2015 9:31 AM  Other: Tilden Fossa, [place] 10/13/2015 9:31 AM  Clinical:  Desma Paganini, RN; Grayland Ormond, RN 10/13/2015 9:31 AM  Other: , RN Charge Nurse 10/13/2015 9:31 AM  Other: Hilda Lias, [place], [place] Work [phone]

## 2015-10-13 NOTE — Progress Notes (Addendum)
[facility] MD Progress Note  10/13/2015 4:55 PM Justin Chaney  MRN:  [id number] Subjective:  Patient states " I feel the same ". Denies medication side effects Objective  : I have discussed case with treatment team and have met with patient. Patient is a 20 year old male, who is known to our unit from recent psychiatric admission   From 2/2 - 2/11. He has been diagnosed with Bipolar Disorder and with Schizoaffective Disorder in the past. He responded well to Abilify/Lithium during recent admission. Patient was readmitted due to depression, suicidal ideations, and reports of hallucinations. Admission UDS positive for Stimulants and Cocaine . At this time presents irritable, with fair eye contact, and answering most questions with short sentences or monosyllables. Also states " I do not want to talk right now" in an attempt to shorten session. Does endorse depression. At present endorses passive SI but denies plan or intention of hurting self or of SI on unit  Denies hallucinations at this time   Denies medication side effects ( has been restarted on Abilify and LiC03 ). Very low serum lithium level on admission suggests medication non compliance. Isolating in room, minimal group/milieu participation at this time .   Principal Problem: MDD (major depressive disorder) ([place]) Diagnosis:   Patient Active Problem List   Diagnosis Date Noted  . Cocaine abuse [F14.10]   . Schizoaffective disorder ([place]) [F25.9]   . MDD (major depressive disorder), recurrent, severe, with psychosis ([place]) [F33.3] 10/11/2015  . MDD (major depressive disorder) ([place]) [F32.9] 10/11/2015  . Suicidal thoughts [R45.851]   . Intermittent explosive disorder [F63.81] 09/05/2015  . Schizoaffective disorder, bipolar type ([place]) [F25.0] 09/04/2015  . Non compliance w medication regimen [Z91.14] 01/17/2015  . Substance induced mood disorder ([place]) [F19.94] 02/08/2014  . 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  Cannabis abuse [F12.10] 08/15/2012   Total Time spent with patient: 20 minutes    Past Medical History:  Past Medical History  Diagnosis Date  . Asthma   . Seizures ([place])   . Brain ventricular shunt obstruction     hydrochelpis  . Depression   . Bipolar disorder [facility])     Past Surgical History  Procedure Laterality Date  . Tonsillectomy    . Ventriculo-peritoneal shunt placement / laparoscopic insertion peritoneal catheter     Family History:  Family History  Problem Relation Age of Onset  . Hypertension Mother   . Mental illness Neg Hx     Social History:  History  Alcohol Use  . 7.2 oz/week  . 12 Cans of beer per week    Comment: 4-8 40 oz.on the weekend     History  Drug Use  . Yes  . Special: Marijuana, Other-see comments, Amphetamines    Comment: Adderall, "I don't do weed, only rarely"    Social History   Social History  . Marital Status: Single    Spouse Name: N/A  . Number of Children: N/A  . Years of Education: N/A   Social History Main Topics  . Smoking status: Current Every Day Smoker -- 1.00 packs/day    Types: Cigarettes  . Smokeless tobacco: None  . Alcohol Use: 7.2 oz/week    12 Cans of beer per week     Comment: 4-8 40 oz.on the weekend  . Drug Use: Yes    Special: Marijuana, Other-see comments, Amphetamines     Comment: Adderall, "I don't do weed, only rarely"  . Sexual Activity: Not Asked   Other  Topics Concern  . None   Social History Narrative   Additional Social History:   Sleep: Good  Appetite:  Fair  Current Medications: Current Facility-Administered Medications  Medication Dose Route Frequency Provider Last Rate Last Dose  . ARIPiprazole (ABILIFY) tablet 10 mg  10 mg Oral Daily Shuvon B Rankin, NP   10 mg at 10/13/15 0831  . hydrOXYzine (ATARAX/VISTARIL) tablet 25 mg  25 mg Oral TID PRN Shuvon B Rankin, NP      . lithium carbonate capsule 300 mg  300 mg Oral Daily Shuvon B Rankin, NP   300 mg at 10/13/15 0832  .  lithium carbonate capsule 600 mg  600 mg Oral QHS Shuvon B Rankin, NP   600 mg at 10/12/15 2117  . magnesium hydroxide (MILK OF MAGNESIA) suspension 30 mL  30 mL Oral Daily PRN Shuvon B Rankin, NP      . nicotine (NICODERM CQ - dosed in mg/24 hours) patch 21 mg  21 mg Transdermal Daily Jenne Campus, MD   21 mg at 10/13/15 0258    Lab Results: No results found for this or any previous visit (from the past 48 hour(s)).  Blood Alcohol level:  Lab Results  Component Value Date   ETH <5 10/11/2015   ETH <5 09/04/2015    Physical Findings: AIMS: Facial and Oral Movements Muscles of Facial Expression: None, normal Lips and Perioral Area: None, normal Jaw: None, normal Tongue: None, normal,Extremity Movements Upper (arms, wrists, hands, fingers): None, normal Lower (legs, knees, ankles, toes): None, normal, Trunk Movements Neck, shoulders, hips: None, normal, Overall Severity Severity of abnormal movements (highest score from questions above): None, normal Incapacitation due to abnormal movements: None, normal Patient's awareness of abnormal movements (rate only patient's report): No Awareness, Dental Status Current problems with teeth and/or dentures?: No Does patient usually wear dentures?: No  CIWA:  CIWA-Ar Total: 1 COWS:  COWS Total Score: 2  Musculoskeletal: Strength & Muscle Tone: within normal limits Gait & Station: normal Patient leans: N/A  Psychiatric Specialty Exam: ROS denies nausea, denies vomiting   Blood pressure 113/60, pulse 83, temperature 98.5 F (36.9 C), temperature source Oral, resp. rate 18, height '5\' 3"'  (1.6 m), weight 225 lb (102.059 kg), SpO2 97 %.Body mass index is 39.87 kg/(m^2).  General Appearance: Fairly Groomed  Engineer, water::  Fair  Speech:  Slow  Volume:  Decreased  Mood:  Dysphoric and Irritable  Affect:  Restricted  Thought Process:  Linear  Orientation:  Other:  alert and attentive   Thought Content:  denies hallucinations, no delusions  - does not appear internally preoccupied   Suicidal Thoughts:  Yes.  without intent/plan- denies any plan or intention of suicide or of hurting self on unit   Homicidal Thoughts:  No  Memory:  recent and remote grossly intact   Judgement:  Fair  Insight:  Fair  Psychomotor Activity:  Decreased  Concentration:  Good  Recall:  Good  Fund of Knowledge:Good  Language: Good  Akathisia:  Negative  Handed:  Right  AIMS (if indicated):     Assets:  Desire for Improvement Resilience  ADL's:  Impaired  Cognition: WNL  Sleep:  Number of Hours: 6.25  Assessment - patient presents irritable, dysphoric, with fair eye contact, answering only some questions, and expressing irritability  . Denies psychotic symptoms and does not appear internally preoccupied or agitated . Denies medication side effects ( on Abilify and Lithium ) which he had responded well to during recent  admission.  Treatment Plan Summary: Daily contact with patient to assess and evaluate symptoms and progress in treatment, Medication management, Plan inpatient admission and medications as below  Encouraged increased milieu participation to work on coping skills and symptom reduction Continue Abilify 10 mgrs QDAY for mood disorder , irritability  Continue Lithium 300 mgrs QAM and 600 mgrs QHS for mood disorder Continue Vistaril 25 mgrs Q 6 hours PRN for anxiety as needed  Neita Garnet, MD 10/13/2015, 4:55 PM

## 2015-10-13 NOTE — Progress Notes (Signed)
D:  Nurse talked to patient this morning.  Patient contracts for safety after the 3rd time, complaining that he wanted RN/MHT to go away and let him sleep.  Denied HI.  Denied A/V hallucinations.  Stated he just wanted to be left alone.  A:  Medications administered per MD orders.  Emotional support and encouragement given patient. R:  Safety maintained with 15 minute checks. MD/Charge nurse informed of patient statetments this morning.

## 2015-10-13 NOTE — BHH Group Notes (Signed)
[facility] LCSW Aftercare Discharge Planning Group Note  10/13/2015 8:45 AM  Pt did not attend, declined invitation.   [name], LCSWA 10/13/2015 9:30 AM

## 2015-10-13 NOTE — Progress Notes (Signed)
Recreation Therapy Notes  Date: 03.13.2017 Time: 9:30am  Location: [address] Group Room   Group Topic: Stress Management  Goal Area(s) Addresses:  Patient will actively participate in stress management techniques presented during session.   Behavioral Response: Did not attend.   Laureen Ochs Amulya Quintin, LRT/CTRS        Sherlene Rickel L 10/13/2015 1:45 PM

## 2015-10-14 DIAGNOSIS — R45851 Suicidal ideations: Secondary | ICD-10-CM

## 2015-10-14 DIAGNOSIS — F339 Major depressive disorder, recurrent, unspecified: Secondary | ICD-10-CM

## 2015-10-14 NOTE — BHH Group Notes (Signed)
[place] Group Notes:  (Nursing/MHT/Case Management/Adjunct)  Date:  10/14/2015  Time:  0900  Type of Therapy:  Nurse Education  Participation Level:  Did Not Attend  Participation Quality:    Affect:    Cognitive:    Insight:    Engagement in Group:    Modes of Intervention:    Summary of Progress/Problems: Patient invited however continues to remain isolative to bed throughout the day.  Jamie Kato 10/14/2015, 1:31 PM

## 2015-10-14 NOTE — Progress Notes (Signed)
D: Patient observed in bed with eyes open. Patient did not want to engage in conversation with this Probation officer. Patient does contract for safety. Patient irritable at this time will re approach later. Patient did not state goal for day nor would he engage in conversation with writer once he was out of bed. Patient did take medications as ordered.  A: Support and encouragement offered. Q 15 minute checks in progress and maintained.  R: Patient remains safe on unit.

## 2015-10-14 NOTE — Progress Notes (Signed)
[facility] MD Progress Note  10/14/2015 12:52 PM Justin Chaney  MRN:  [id number] Subjective:  Patient was found in room asleep.  When awakened we discussed the steps he was taking to work on his depression.   Denies ADR's. Objective:   I have discussed case with treatment team and have met with patient. Patient is a 20 year old male, who is known to our unit from recent psychiatric admission   From 2/2 - 2/11. He has been diagnosed with Bipolar Disorder and with Schizoaffective Disorder in the past. He responded well to Abilify/Lithium during recent admission. Patient was readmitted due to depression, suicidal ideations, and reports of hallucinations. Admission UDS positive for Stimulants and Cocaine . At this time presents irritable, with fair eye contact, and answering most questions with short sentences or monosyllables. Also states " I do not want to talk right now" in an attempt to shorten session. Does endorse depression. At present endorses passive SI but denies plan or intention of hurting self or of SI on unit  Denies hallucinations at this time   Denies medication side effects ( has been restarted on Abilify and LiC03 ). Very low serum lithium level on admission suggests medication non compliance. Remains to isolate in room, minimal group/milieu participation at this time .   Principal Problem: MDD (major depressive disorder) ([place]) Diagnosis:   Patient Active Problem List   Diagnosis Date Noted  . Schizoaffective disorder, bipolar type ([place]) [F25.0] 09/04/2015    Priority: High  . Cocaine abuse [F14.10]   . Schizoaffective disorder ([place]) [F25.9]   . MDD (major depressive disorder), recurrent, severe, with psychosis ([place]) [F33.3] 10/11/2015  . MDD (major depressive disorder) ([place]) [F32.9] 10/11/2015  . Suicidal thoughts [R45.851]   . Intermittent explosive disorder [F63.81] 09/05/2015  . Non compliance w medication regimen [Z91.14] 01/17/2015  . Substance induced mood disorder  ([place]) [F19.94] 02/08/2014  . 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 Cannabis abuse [F12.10] 08/15/2012   Total Time spent with patient: 20 minutes    Past Medical History:  Past Medical History  Diagnosis Date  . Asthma   . Seizures ([place])   . Brain ventricular shunt obstruction     hydrochelpis  . Depression   . Bipolar disorder [facility])     Past Surgical History  Procedure Laterality Date  . Tonsillectomy    . Ventriculo-peritoneal shunt placement / laparoscopic insertion peritoneal catheter     Family History:  Family History  Problem Relation Age of Onset  . Hypertension Mother   . Mental illness Neg Hx     Social History:  History  Alcohol Use  . 7.2 oz/week  . 12 Cans of beer per week    Comment: 4-8 40 oz.on the weekend     History  Drug Use  . Yes  . Special: Marijuana, Other-see comments, Amphetamines    Comment: Adderall, "I don't do weed, only rarely"    Social History   Social History  . Marital Status: Single    Spouse Name: N/A  . Number of Children: N/A  . Years of Education: N/A   Social History Main Topics  . Smoking status: Current Every Day Smoker -- 1.00 packs/day    Types: Cigarettes  . Smokeless tobacco: None  . Alcohol Use: 7.2 oz/week    12 Cans of beer per week     Comment: 4-8 40 oz.on the weekend  . Drug Use: Yes    Special: Marijuana, Other-see comments, Amphetamines  Comment: Adderall, "I don't do weed, only rarely"  . Sexual Activity: Not Asked   Other Topics Concern  . None   Social History Narrative   Additional Social History:   Sleep: Good  Appetite:  Fair  Current Medications: Current Facility-Administered Medications  Medication Dose Route Frequency Provider Last Rate Last Dose  . ARIPiprazole (ABILIFY) tablet 10 mg  10 mg Oral Daily Shuvon B Rankin, NP   10 mg at 10/13/15 0831  . hydrOXYzine (ATARAX/VISTARIL) tablet 25 mg  25 mg Oral TID PRN Shuvon B Rankin, NP      . lithium  carbonate capsule 300 mg  300 mg Oral Daily Shuvon B Rankin, NP   300 mg at 10/14/15 0839  . lithium carbonate capsule 600 mg  600 mg Oral QHS Shuvon B Rankin, NP   600 mg at 10/13/15 2125  . magnesium hydroxide (MILK OF MAGNESIA) suspension 30 mL  30 mL Oral Daily PRN Shuvon B Rankin, NP      . nicotine (NICODERM CQ - dosed in mg/24 hours) patch 21 mg  21 mg Transdermal Daily Jenne Campus, MD   21 mg at 10/14/15 5379    Lab Results: No results found for this or any previous visit (from the past 48 hour(s)).  Blood Alcohol level:  Lab Results  Component Value Date   ETH <5 10/11/2015   ETH <5 09/04/2015    Physical Findings: AIMS: Facial and Oral Movements Muscles of Facial Expression: None, normal Lips and Perioral Area: None, normal Jaw: None, normal Tongue: None, normal,Extremity Movements Upper (arms, wrists, hands, fingers): None, normal Lower (legs, knees, ankles, toes): None, normal, Trunk Movements Neck, shoulders, hips: None, normal, Overall Severity Severity of abnormal movements (highest score from questions above): None, normal Incapacitation due to abnormal movements: None, normal Patient's awareness of abnormal movements (rate only patient's report): No Awareness, Dental Status Current problems with teeth and/or dentures?: No Does patient usually wear dentures?: No  CIWA:  CIWA-Ar Total: 1 COWS:  COWS Total Score: 2  Musculoskeletal: Strength & Muscle Tone: within normal limits Gait & Station: normal Patient leans: N/A  Psychiatric Specialty Exam: ROS denies nausea, denies vomiting   Blood pressure 122/82, pulse 89, temperature 97.8 F (36.6 C), temperature source Oral, resp. rate 20, height 5' 3" (1.6 m), weight 102.059 kg (225 lb), SpO2 97 %.Body mass index is 39.87 kg/(m^2).  General Appearance: Fairly Groomed  Engineer, water::  Fair  Speech:  Slow  Volume:  Decreased  Mood:  Dysphoric and Irritable  Affect:  Restricted  Thought Process:  Linear   Orientation:  Other:  alert and attentive   Thought Content:  denies hallucinations, no delusions - does not appear internally preoccupied   Suicidal Thoughts:  Yes.  without intent/plan- denies any plan or intention of suicide or of hurting self on unit   Homicidal Thoughts:  No  Memory:  recent and remote grossly intact   Judgement:  Fair  Insight:  Fair  Psychomotor Activity:  Decreased  Concentration:  Good  Recall:  Good  Fund of Knowledge:Good  Language: Good  Akathisia:  Negative  Handed:  Right  AIMS (if indicated):     Assets:  Desire for Improvement Resilience  ADL's:  Impaired  Cognition: WNL  Sleep:  Number of Hours: 6  Assessment - patient presents irritable, dysphoric, with fair eye contact, answering only some questions, and expressing irritability  . Denies psychotic symptoms and does not appear internally preoccupied or agitated . Denies  medication side effects ( on Abilify and Lithium ) which he had responded well to during recent admission.  Treatment Plan Summary: Daily contact with patient to assess and evaluate symptoms and progress in treatment, Medication management, Plan inpatient admission and medications as below  Encouraged increased milieu participation to work on coping skills and symptom reduction Continue Abilify 10 mgrs QDAY for mood disorder , irritability  Continue Lithium 300 mgrs QAM and 600 mgrs QHS for mood disorder Continue Vistaril 25 mgrs Q 6 hours PRN for anxiety as needed  Discussed sleep hygeine so that patient can be up more during day and participate in [place], NP [facility] 10/14/2015, 12:52 PM

## 2015-10-14 NOTE — BHH Counselor (Signed)
Adult Comprehensive Assessment  Patient ID: Justin Chaney, male DOB: Dec 21, 1995, 20 y.o. MRN: [id number]  Information Source: Information source: Patient  Current Stressors:  Educational / Learning stressors: No GED Employment / Job issues: Animal nutritionist / Lack of resources (include bankruptcy): No income Physical health (include injuries & life threatening diseases): Has a shunt that he has not had checked for quite awhile  Living/Environment/Situation:  Living Arrangements: Non-relatives/Friends Living conditions (as described by patient or guardian): Pt has been living with this friend for several months  Family History:  Marital status: Single Does patient have children?: No  Childhood History:  By whom was/is the patient raised?: Mother/father and step-parent Additional childhood history information: never met father Description of patient's relationship with caregiver when they were a child: never had a good relationship with mom-"I'm the black sheep of the family"  Patient's description of current relationship with people who raised him/her: see above Does patient have siblings?: Yes Number of Siblings: 2 Description of patient's current relationship with siblings: 23 yo half sister, 61 YO half brother-he is annoying as hell Did patient suffer any verbal/emotional/physical/sexual abuse as a child?: No Did patient suffer from severe childhood neglect?: No Has patient ever been sexually abused/assaulted/raped as an adolescent or adult?: No Was the patient ever a victim of a crime or a disaster?: No Witnessed domestic violence?: No Has patient been effected by domestic violence as an adult?: No  Education:  Highest grade of school patient has completed: 9th (I was kicked out of school for missing too many days) Currently a Ship broker?: No Learning disability?: No  Employment/Work Situation:  Employment situation: Unemployed "I was working at 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 [employer] Has patient ever been in the [employer]?: No Has patient ever served in Recruitment consultant?: No  Financial Resources:  Financial resources: No income Does patient have a Programmer, applications or guardian?: No  Alcohol/Substance Abuse:  What has been your use of drugs/alcohol within the last 12 months?: Pt was positive for amphetamines and cocaine on admission Alcohol/Substance Abuse Treatment Hx: Denies past history Has alcohol/substance abuse ever caused legal problems?: No  Social Support System:  Heritage manager System: None Type of faith/religion: Spirituality How does patient's faith help to cope with current illness?: Meditation  Leisure/Recreation:  Leisure and Hobbies: Write music, dance, cook  Strengths/Needs:  What things does the patient do well?: Like to help people out In what areas does patient struggle / problems for patient: Easily distracted.   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 Pt presented to the hospital with suicidal  ideation, AVH, and reporting that he had attempted suicide multiple times that day. Pt reports  primary trigger(s) for admission was unmanaged depression. Patient will benefit from crisis stabilization, medication evaluation, group therapy and psycho education in addition to case management for discharge planning. At discharge it is recommended that Pt remain compliant with established discharge plan and continued treatment.  [name], [place] Work [phone]

## 2015-10-14 NOTE — Progress Notes (Signed)
Patient continues to refuse participation in treatment. Lies in bed. Irritable and oppositional with staff. Asking lunch be brought back to him however acknowledges he is ambulatory and able to go with peers. Questioning other unit policies as well. Patient rates his depression and hopelessness both at an 8/10 but denies anxiety. Denies pain, physical issues. Redirected patient's behavior as needed. Explanations provided regarding policies. Self inventory reviewed. He endorses passive SI but when asked how he might work to eliminate these thoughts patient states, "I don't want to talk right now." He denies intent or plan. No HI/AVH and remains safe. Justin Chaney

## 2015-10-14 NOTE — Progress Notes (Signed)
Adult Psychoeducational Group Note  Date:  10/14/2015 Time:  9:13 PM  Group Topic/Focus:  Wrap-Up Group:   The focus of this group is to help patients review their daily goal of treatment and discuss progress on daily workbooks.  Participation Level:  Did Not Attend  Participation Quality:  Did not attend  Affect:  Did not attend  Cognitive:  Did not attend  Insight: None  Engagement in Group:  Did not attend  Modes of Intervention:  Did not attend  Additional Comments:  Patient did not attend wrap up group tonight.   Cathyrn Stephanny Tsutsui L  10/14/2015, 9:13 PM

## 2015-10-14 NOTE — Plan of Care (Signed)
Problem: Alteration in mood Goal: STG-Patient reports thoughts of self-harm to staff Outcome: Progressing Patient is reporting passive SI but denies plan or intent to harm self.  Problem: Diagnosis: Increased Risk For Suicide Attempt Goal: STG-Patient Will Comply With Medication Regime Outcome: Not Progressing Patient refusing abilify.

## 2015-10-14 NOTE — Progress Notes (Signed)
Recreation Therapy Notes  Animal-Assisted Activity (AAA) Program Checklist/Progress Notes Patient Eligibility Criteria Checklist & Daily Group note for Rec Tx Intervention  Date: 03.14.2017 Time: 2:45am Location: [address]    AAA/T Program Assumption of Risk Form signed by Patient/ or Parent Legal Guardian yes  Patient is free of allergies or sever asthma yes  Patient reports no fear of animals yes  Patient reports no history of cruelty to animals yes  Patient understands his/her participation is voluntary yes  Patient washes hands before animal contact yes  Patient washes hands after animal contact yes  Behavioral Response: Appropriate  Education: Hand Washing, Appropriate Animal Interaction   Education Outcome: Acknowledges education.   Clinical Observations/Feedback: Patient actively engaged in group session, petting therapy dog appropriately and interacting with peers appropriately.   Laureen Ochs Angelique Chevalier, LRT/CTRS        Thanya Cegielski L 10/14/2015 3:10 PM

## 2015-10-14 NOTE — Plan of Care (Signed)
Problem: Ineffective individual coping Goal: STG: Patient will remain free from self harm Outcome: Progressing Q 15 minute checks in progress and maintained. Patient in room most of shift. Patient remains safe on unit.

## 2015-10-15 NOTE — Progress Notes (Signed)
D: Pt denies SI/HI/AVH. Pt is pleasant and cooperative. Pt with minimal conversation. Patient in room most of shift. Pt. States his day was ok. Patient  Not engaging in a long conversation with this Probation officer. A: Pt was offered support and encouragement. Pt was given scheduled medications. Pt was encourage to attend groups. Q 15 minute checks were done for safety.  R:Pt attends groups and interacts well with peers and staff. Pt is taking medication. Pt has no complaints.Pt receptive to treatment and safety maintained on unit.

## 2015-10-15 NOTE — B[facility] Group Notes (Signed)
[place] [facility] LCSW Group Therapy 10/15/2015 1:15 PM  Type of Therapy: Group Therapy- Emotion Regulation  Pt did not attend, declined invitation.   [name], [place] 10/15/2015 3:52 PM

## 2015-10-15 NOTE — Progress Notes (Addendum)
Patient ID: Justin Chaney, male   DOB: [birth year], 20 y.o.   MRN: [id number]  [facility] MD Progress Note  10/15/2015 11:15 AM Justin Chaney  MRN:  [id number] Subjective:    States he feels " about the same", but does state he is feeling less irritable . At this time does not endorse medication side effects . Objective:   I have discussed case with treatment team and have met with patient. Patient presented with significant irritability, negativism on admission, but today presents partially improved. He is out of bed, and agreed to meet with Probation officer without anger. He presented calmer and with much improved eye contact . He still presents dysphoric, and as above describes mood as " the same ".  Denies hallucinations, not internally preoccupied at this time. States he is tolerating medications well , denies side effects. Attributes decompensation partly to recent stimulant abuse/ binge . Marland Kitchen Still isolative, with little group participation, but visible on unit, watching TV in day room.    Principal Problem: MDD (major depressive disorder) ([place]) Diagnosis:   Patient Active Problem List   Diagnosis Date Noted  . Cocaine abuse [F14.10]   . Schizoaffective disorder ([place]) [F25.9]   . MDD (major depressive disorder), recurrent, severe, with psychosis ([place]) [F33.3] 10/11/2015  . MDD (major depressive disorder) ([place]) [F32.9] 10/11/2015  . Suicidal thoughts [R45.851]   . Intermittent explosive disorder [F63.81] 09/05/2015  . Schizoaffective disorder, bipolar type ([place]) [F25.0] 09/04/2015  . Non compliance w medication regimen [Z91.14] 01/17/2015  . Substance induced mood disorder ([place]) [F19.94] 02/08/2014  . 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 Cannabis abuse [F12.10] 08/15/2012   Total Time spent with patient: 20 minutes    Past Medical History:  Past Medical History  Diagnosis Date  . Asthma   . Seizures ([place])   . Brain ventricular shunt obstruction     hydrochelpis   . Depression   . Bipolar disorder [facility])     Past Surgical History  Procedure Laterality Date  . Tonsillectomy    . Ventriculo-peritoneal shunt placement / laparoscopic insertion peritoneal catheter     Family History:  Family History  Problem Relation Age of Onset  . Hypertension Mother   . Mental illness Neg Hx     Social History:  History  Alcohol Use  . 7.2 oz/week  . 12 Cans of beer per week    Comment: 4-8 40 oz.on the weekend     History  Drug Use  . Yes  . Special: Marijuana, Other-see comments, Amphetamines    Comment: Adderall, "I don't do weed, only rarely"    Social History   Social History  . Marital Status: Single    Spouse Name: N/A  . Number of Children: N/A  . Years of Education: N/A   Social History Main Topics  . Smoking status: Current Every Day Smoker -- 1.00 packs/day    Types: Cigarettes  . Smokeless tobacco: None  . Alcohol Use: 7.2 oz/week    12 Cans of beer per week     Comment: 4-8 40 oz.on the weekend  . Drug Use: Yes    Special: Marijuana, Other-see comments, Amphetamines     Comment: Adderall, "I don't do weed, only rarely"  . Sexual Activity: Not Asked   Other Topics Concern  . None   Social History Narrative   Additional Social History:   Sleep: Good  Appetite:  Fair  Current Medications: Current Facility-Administered Medications  Medication Dose Route Frequency Provider Last  Rate Last Dose  . ARIPiprazole (ABILIFY) tablet 10 mg  10 mg Oral Daily Shuvon B Rankin, NP   10 mg at 10/15/15 1100  . hydrOXYzine (ATARAX/VISTARIL) tablet 25 mg  25 mg Oral TID PRN Shuvon B Rankin, NP      . lithium carbonate capsule 300 mg  300 mg Oral Daily Shuvon B Rankin, NP   300 mg at 10/15/15 1100  . lithium carbonate capsule 600 mg  600 mg Oral QHS Shuvon B Rankin, NP   600 mg at 10/14/15 2204  . magnesium hydroxide (MILK OF MAGNESIA) suspension 30 mL  30 mL Oral Daily PRN Shuvon B Rankin, NP      . nicotine (NICODERM CQ - dosed in  mg/24 hours) patch 21 mg  21 mg Transdermal Daily Jenne Campus, MD   21 mg at 10/14/15 2585    Lab Results: No results found for this or any previous visit (from the past 48 hour(s)).  Blood Alcohol level:  Lab Results  Component Value Date   ETH <5 10/11/2015   ETH <5 09/04/2015    Physical Findings: AIMS: Facial and Oral Movements Muscles of Facial Expression: None, normal Lips and Perioral Area: None, normal Jaw: None, normal Tongue: None, normal,Extremity Movements Upper (arms, wrists, hands, fingers): None, normal Lower (legs, knees, ankles, toes): None, normal, Trunk Movements Neck, shoulders, hips: None, normal, Overall Severity Severity of abnormal movements (highest score from questions above): None, normal Incapacitation due to abnormal movements: None, normal Patient's awareness of abnormal movements (rate only patient's report): No Awareness, Dental Status Current problems with teeth and/or dentures?: No Does patient usually wear dentures?: No  CIWA:  CIWA-Ar Total: 1 COWS:  COWS Total Score: 2  Musculoskeletal: Strength & Muscle Tone: within normal limits Gait & Station: normal Patient leans: N/A  Psychiatric Specialty Exam: ROS denies nausea, denies vomiting   Blood pressure 116/64, pulse 102, temperature 98.9 F (37.2 C), temperature source Oral, resp. rate 19, height '5\' 3"'  (1.6 m), weight 225 lb (102.059 kg), SpO2 97 %.Body mass index is 39.87 kg/(m^2).  General Appearance: improved grooming   Eye Contact::   Improved   Speech:  Slow  Volume:  Decreased  Mood:  Still dysphoric but improving   Affect:   Less irritable   Thought Process:  Linear  Orientation:  Other:  alert and attentive   Thought Content:  denies hallucinations, no delusions - does not appear internally preoccupied   Suicidal Thoughts:  denies any plan or intention of suicide or of hurting self on unit , contracts for safety on unit   Homicidal Thoughts:  No  Memory:  recent and  remote grossly intact   Judgement:  Fair  Insight:  Fair  Psychomotor Activity:  More visible on unit, but still isolates in room   Concentration:  Good  Recall:  Good  Fund of Knowledge:Good  Language: Good  Akathisia:  Negative  Handed:  Right  AIMS (if indicated):     Assets:  Desire for Improvement Resilience  ADL's:  Impaired  Cognition: WNL  Sleep:  Number of Hours: 6.25  Assessment - patient presents with some improvement , less significantly angry, irritable, better related , improved eye contact . Denies SI .  Still isolative, with minimal group participation.  Tolerating Abilify and Lithium well .   Treatment Plan Summary: Daily contact with patient to assess and evaluate symptoms and progress in treatment, Medication management, Plan inpatient admission and medications as below  Encouraged increased  milieu participation to work on coping skills and symptom reduction Continue Abilify 10 mgrs QDAY for mood disorder , irritability  Continue Lithium 300 mgrs QAM and 600 mgrs QHS for mood disorder Continue Vistaril 25 mgrs Q 6 hours PRN for anxiety as needed  Lithium level tomorrow in AM  Neita Garnet, MD  10/15/2015, 11:15 AM

## 2015-10-15 NOTE — B[facility] Group Notes (Signed)
[facility] LCSW Aftercare Discharge Planning Group Note  10/15/2015 8:45 AM  Pt did not attend, declined invitation.   Justin Chaney, [name] 10/15/2015 9:38 AM

## 2015-10-15 NOTE — Progress Notes (Signed)
Recreation Therapy Notes  Date: 03.15.2017 Time: 9:30am Location: [address] Group Room   Group Topic: Stress Management  Goal Area(s) Addresses:  Patient will actively participate in stress management techniques presented during session.   Behavioral Response: Did not attend.   Laureen Ochs Tramaine Snell, LRT/CTRS        Marcelis Wissner L 10/15/2015 1:54 PM

## 2015-10-15 NOTE — Progress Notes (Signed)
Patient ID: Justin Chaney, male   DOB: 1995-12-10, 20 y.o.   MRN: [id number]  Patient continues to be uncooperative and oppositional. He refuses to answer staffs questions regarding his safety. "I don't have to answer you." He does report passive SI but is able to contract for safety. He denies pain. However, will not answer if he is HI or having A/V hallucinations. He does not appear to be responding to internal stimuli at this time. He refused to cooperate thus did not take morning medications. He does report sleep was fair, appetite is fair, energy level is low, and concentration is poor. He rates depression and hopelessness 9/10 and anxiety 8/10. Writer and other nursing staff continue to encourage patient to cooperate and participate however patient refuses. Q15 minute safety checks are maintained.

## 2015-10-16 LAB — LITHIUM LEVEL: Lithium Lvl: 0.25 mmol/L — ABNORMAL LOW (ref 0.60–1.20)

## 2015-10-16 MED ORDER — LITHIUM CARBONATE 300 MG PO CAPS
600.0000 mg | ORAL_CAPSULE | Freq: Two times a day (BID) | ORAL | Status: DC
  Administered 2015-10-17: 600 mg via ORAL
  Filled 2015-10-16 (×5): qty 2

## 2015-10-16 NOTE — B[facility] Group Notes (Signed)
Adult Psychoeducational Group Note  Date:  10/16/2015 Time:  8:59 PM  Group Topic/Focus:  Wrap-Up Group:   The focus of this group is to help patients review their daily goal of treatment and discuss progress on daily workbooks.  Participation Level:  Did Not Attend  Participation Quality:  None  Affect:  None  Cognitive:  None  Insight: None  Engagement in Group:  None  Modes of Intervention:  Discussion  Additional Comments:  Pt did not attend group.  Victorino Sparrow A 10/16/2015, 8:59 PM

## 2015-10-16 NOTE — Progress Notes (Signed)
Patient ID: Justin Chaney, male   DOB: 11/17/1995, 20 y.o.   MRN: [id number]  [facility] MD Progress Note  10/16/2015 11:47 AM Justin Chaney  MRN:  [id number] Subjective:    Reports he is feeling better. Denies medication side effects . Appears more focused on discharge planning /options and expressed some interest in going to a rehab, although later in the day came back to writer and stated not interested in rehab and planning to return to brother's home on discharge. Objective:   I have discussed case with treatment team and have met with patient. Gradually improving , less irritable, more visible on unit. Milieu/ group participation is limited, and states " I  just don't like going to groups that much" Today more amenable to discuss events that led to admission, and states he is aware that stimulant abuse " changes my whole personality". States he plans to stay sober, and states he feels " bad " about recent relapse. We discussed options such as going to a rehab, and I strongly encouraged him to go to 12 step meetings and to avoid people, places and situations he associates with drug use in order to decrease risk of relapse . No delusions, no  hallucinations, not internally preoccupied at this time. Denies medication  side effects.   Lithium level low ( 0.25 )   Principal Problem: MDD (major depressive disorder) ([place]) Diagnosis:   Patient Active Problem List   Diagnosis Date Noted  . Cocaine abuse [F14.10]   . Schizoaffective disorder ([place]) [F25.9]   . MDD (major depressive disorder), recurrent, severe, with psychosis ([place]) [F33.3] 10/11/2015  . MDD (major depressive disorder) ([place]) [F32.9] 10/11/2015  . Suicidal thoughts [R45.851]   . Intermittent explosive disorder [F63.81] 09/05/2015  . Schizoaffective disorder, bipolar type ([place]) [F25.0] 09/04/2015  . Non compliance w medication regimen [Z91.14] 01/17/2015  . Substance induced mood disorder ([place]) [F19.94] 02/08/2014  . Amphetamine or  stimulant drug abuse [F15.10] 08/15/2012  . Cannabis abuse [F12.10] 08/15/2012   Total Time spent with patient: 20 minutes    Past Medical History:  Past Medical History  Diagnosis Date  . Asthma   . Seizures ([place])   . Brain ventricular shunt obstruction     hydrochelpis  . Depression   . Bipolar disorder [facility])     Past Surgical History  Procedure Laterality Date  . Tonsillectomy    . Ventriculo-peritoneal shunt placement / laparoscopic insertion peritoneal catheter     Family History:  Family History  Problem Relation Age of Onset  . Hypertension Mother   . Mental illness Neg Hx     Social History:  History  Alcohol Use  . 7.2 oz/week  . 12 Cans of beer per week    Comment: 4-8 40 oz.on the weekend     History  Drug Use  . Yes  . Special: Marijuana, Other-see comments, Amphetamines    Comment: Adderall, "I don't do weed, only rarely"    Social History   Social History  . Marital Status: Single    Spouse Name: N/A  . Number of Children: N/A  . Years of Education: N/A   Social History Main Topics  . Smoking status: Current Every Day Smoker -- 1.00 packs/day    Types: Cigarettes  . Smokeless tobacco: None  . Alcohol Use: 7.2 oz/week    12 Cans of beer per week     Comment: 4-8 40 oz.on the weekend  . Drug Use: Yes    Special: Marijuana, Other-see  comments, Amphetamines     Comment: Adderall, "I don't do weed, only rarely"  . Sexual Activity: Not Asked   Other Topics Concern  . None   Social History Narrative   Additional Social History:   Sleep: fair   Appetite:   Improved   Current Medications: Current Facility-Administered Medications  Medication Dose Route Frequency Provider Last Rate Last Dose  . ARIPiprazole (ABILIFY) tablet 10 mg  10 mg Oral Daily Shuvon B Rankin, NP   10 mg at 10/16/15 1100  . hydrOXYzine (ATARAX/VISTARIL) tablet 25 mg  25 mg Oral TID PRN Shuvon B Rankin, NP      . lithium carbonate capsule 300 mg  300 mg Oral Daily  Shuvon B Rankin, NP   300 mg at 10/16/15 1100  . lithium carbonate capsule 600 mg  600 mg Oral QHS Shuvon B Rankin, NP   600 mg at 10/15/15 2137  . magnesium hydroxide (MILK OF MAGNESIA) suspension 30 mL  30 mL Oral Daily PRN Shuvon B Rankin, NP      . nicotine (NICODERM CQ - dosed in mg/24 hours) patch 21 mg  21 mg Transdermal Daily Jenne Campus, MD   21 mg at 10/14/15 2229    Lab Results:  Results for orders placed or performed during the hospital encounter of 10/11/15 (from the past 48 hour(s))  Lithium level     Status: Abnormal   Collection Time: 10/16/15  6:36 AM  Result Value Ref Range   Lithium Lvl 0.25 (L) 0.60 - 1.20 mmol/L    Comment: Performed at [facility]    Blood Alcohol level:  Lab Results  Component Value Date   [facility] <5 10/11/2015   ETH <5 09/04/2015    Physical Findings: AIMS: Facial and Oral Movements Muscles of Facial Expression: None, normal Lips and Perioral Area: None, normal Jaw: None, normal Tongue: None, normal,Extremity Movements Upper (arms, wrists, hands, fingers): None, normal Lower (legs, knees, ankles, toes): None, normal, Trunk Movements Neck, shoulders, hips: None, normal, Overall Severity Severity of abnormal movements (highest score from questions above): None, normal Incapacitation due to abnormal movements: None, normal Patient's awareness of abnormal movements (rate only patient's report): No Awareness, Dental Status Current problems with teeth and/or dentures?: No Does patient usually wear dentures?: No  CIWA:  CIWA-Ar Total: 1 COWS:  COWS Total Score: 2  Musculoskeletal: Strength & Muscle Tone: within normal limits Gait & Station: normal Patient leans: N/A  Psychiatric Specialty Exam: ROS denies nausea, denies vomiting   Blood pressure 124/66, pulse 89, temperature 98.4 F (36.9 C), temperature source Oral, resp. rate 20, height '5\' 3"'  (1.6 m), weight 225 lb (102.059 kg), SpO2 97 %.Body mass index is 39.87  kg/(m^2).  General Appearance: improved grooming   Eye Contact::   Improved   Speech:  Slow  Volume:  Decreased  Mood:  Gradually improving   Affect:   Less irritable   Thought Process:  Linear  Orientation:  Other:  alert and attentive   Thought Content:  denies hallucinations, no delusions - does not appear internally preoccupied   Suicidal Thoughts:  denies any plan or intention of suicide or of hurting self on unit , contracts for safety on unit   Homicidal Thoughts:  No  Memory:  recent and remote grossly intact   Judgement:  improving   Insight:   Improving   Psychomotor Activity:  Improved, visible on unit, but little milieu participation  Concentration:  Good  Recall:  Good  Fund of Knowledge:Good  Language: Good  Akathisia:  Negative  Handed:  Right  AIMS (if indicated):     Assets:  Desire for Improvement Resilience  ADL's:  Impaired  Cognition: WNL  Sleep:  Number of Hours: 3.5  Assessment - patient presents less irritable, less dysphoric, and better able to discuss substance abuse history as a major contributor to decompensation. At this time tolerating medications well . Lithium level sub therapeutic .   Treatment Plan Summary: Daily contact with patient to assess and evaluate symptoms and progress in treatment, Medication management, Plan inpatient admission and medications as below  Encouraged increased milieu participation to work on coping skills and symptom reduction Continue Abilify 10 mgrs QDAY for mood disorder , irritability  Increase  Lithium to 600 mgrs BID for mood disorder Continue Vistaril 25 mgrs Q 6 hours PRN for anxiety as needed  Treatment team working on disposition options   Neita Garnet, MD  10/16/2015, 11:47 AM

## 2015-10-16 NOTE — Progress Notes (Signed)
Justin Chaney is in a pleasant mood tonight. He is smiling and conversant with this Probation officer. He was asleep earlier and missed group. He rates anxiety and depression 0/10. States today was "[place], I got good news!". Because he states he is being discharged tomorrow. Denies SI/HI/AVH. Encouragement and support given. Medications administered as prescribed. Continue Q 15 minute checks for patient safety and medication effectiveness.

## 2015-10-16 NOTE — Progress Notes (Addendum)
D: Patient stated that he would did not attend group today. He rated his depression a 7/10 and stated he has no anxiety. He denies SI, HI, and AVH. He stated that he has no short term goals and he stated that for long term goals, he wants to stay away from here.  A: Patient safety maintained through q 15 min checks, observation, and encouragement/support.  R: Pt compliant with tx. Will continue to monitor

## 2015-10-16 NOTE — Progress Notes (Signed)
D: Patient is interacting better with staff.  Instead of nodding his head, he is giving short answers.  He did not get out of bed until 1100, at which time he took his medications.  He filled out his self inventory and has been cooperative.  He does not appear to be as irritable.  He presents with sullen, angry affect.  He rates his depression and hopelessness as a 7; he denies anxiety.  He states he had some SI earlier this morning, but at present, he denies any.  He denies any AVH/HI.  He denies any physical pain.   A: Continue to monitor medication management and MD orders.  Safety checks completed every 15 minutes per protocol.  Offer support and encouragement as needed. R: Patient is resistant to treatment; he declines group offers and does not attend.

## 2015-10-16 NOTE — [facility] Group Notes (Signed)
[facility] Group Therapy 10/16/2015 1:15pm  Type of Therapy: [facility] Presentation  Pt did not attend, declined invitation.   [name], LCSWA 10/16/2015 2:01 PM  

## 2015-10-16 NOTE — Progress Notes (Signed)
Nutrition Education Note  Pt attended group focusing on general, healthful nutrition education.  RD emphasized the importance of eating regular meals and snacks throughout the day. Consuming sugar-free beverages and incorporating fruits and vegetables into diet when possible. Provided examples of healthy snacks. Patient encouraged to leave group with a goal to improve nutrition/healthy eating.   Diet Order: Diet regular Room service appropriate?: Yes; Fluid consistency:: Thin Pt is also offered choice of unit snacks mid-morning and mid-afternoon.  Pt is eating as desired.   If additional nutrition issues arise, please consult RD.     Breyden Jeudy, RD, LDN Inpatient Clinical Dietitian Pager # [phone] After hours/weekend pager # [phone]     

## 2015-10-16 NOTE — [facility] Group Notes (Signed)
[place] Group Notes:  (Nursing/MHT/Case Management/Adjunct)  Date:  10/16/2015  Time: 0900 am  Type of Therapy:  Psychoeducational Skills  Participation Level:  Did Not Attend  Patient invited; elected to stay in bed.  Justin Chaney 10/16/2015, 10:54 AM

## 2015-10-17 DIAGNOSIS — F322 Major depressive disorder, single episode, severe without psychotic features: Secondary | ICD-10-CM | POA: Insufficient documentation

## 2015-10-17 MED ORDER — NICOTINE 21 MG/24HR TD PT24: 21.0000 mg | MEDICATED_PATCH | Freq: Every day | TRANSDERMAL | Status: DC

## 2015-10-17 MED ORDER — HYDROXYZINE HCL 25 MG PO TABS: 25.0000 mg | ORAL_TABLET | Freq: Three times a day (TID) | ORAL | Status: DC | PRN

## 2015-10-17 MED ORDER — LITHIUM CARBONATE 600 MG PO CAPS: 600.0000 mg | ORAL_CAPSULE | Freq: Two times a day (BID) | ORAL | Status: DC

## 2015-10-17 MED ORDER — ARIPIPRAZOLE 10 MG PO TABS: 10.0000 mg | ORAL_TABLET | Freq: Every day | ORAL | Status: DC

## 2015-10-17 NOTE — [facility] Suicide Risk Assessment (Addendum)
[facility] Discharge Suicide Risk Assessment   Principal Problem: MDD (major depressive disorder) [facility]) Discharge Diagnoses:  Patient Active Problem List   Diagnosis Date Noted  . Cocaine abuse [F14.10]   . Schizoaffective disorder ([place]) [F25.9]   . MDD (major depressive disorder), recurrent, severe, with psychosis ([place]) [F33.3] 10/11/2015  . MDD (major depressive disorder) ([place]) [F32.9] 10/11/2015  . Suicidal thoughts [R45.851]   . Intermittent explosive disorder [F63.81] 09/05/2015  . Schizoaffective disorder, bipolar type ([place]) [F25.0] 09/04/2015  . Non compliance w medication regimen [Z91.14] 01/17/2015  . Substance induced mood disorder ([place]) [F19.94] 02/08/2014  . 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 Cannabis abuse [F12.10] 08/15/2012    Total Time spent with patient: 30 minutes  Musculoskeletal: Strength & Muscle Tone: within normal limits Gait & Station: normal Patient leans: N/A  Psychiatric Specialty Exam: ROS  Blood pressure 109/59, pulse 96, temperature 98.5 F (36.9 C), temperature source Oral, resp. rate 19, height 5\' 3"  (1.6 m), weight 225 lb (102.059 kg), SpO2 97 %.Body mass index is 39.87 kg/(m^2).  General Appearance: Neat  Eye Contact::  Good  Speech:  Normal Rate409  Volume:  Normal  Mood:  denies depression, states mood is " good "   Affect:  Appropriate  Thought Process:  Linear  Orientation:  Full (Time, Place, and Person)  Thought Content:  denies hallucinations, no delusions , not internally preoccupied   Suicidal Thoughts:  No denies any suicidal or self injurious ideations, denies any violent or homicidal ideations   Homicidal Thoughts:  No  Memory:  recent and remote grossly intact   Judgement:  Improved   Insight:  Fair   Psychomotor Activity:  Normal  Concentration:  Good  Recall:  Good  Fund of Knowledge:Good  Language: Good  Akathisia:  Negative  Handed:  Right  AIMS (if indicated):     Assets:  Communication  Skills Desire for Improvement Resilience  Sleep:  Number of Hours: 6.5  Cognition: WNL  ADL's:  Intact   Mental Status Per Nursing Assessment::   On Admission:     Demographic Factors:  20 year old single male, lives with brother, states he works side jobs 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 History of good response to lithium   Risk Reduction Factors:   Sense of responsibility to family, Living with another person, especially a relative and Positive coping skills or problem solving skills  Continued Clinical Symptoms:  At this time patient is improved compared to admission, presents with improved grooming, improved range of affect, denies depression, denies any suicidal or homicidal ideations, no psychotic symptoms, future oriented . Denies medication side effects on Lithium/ Abilify - we have reviewed side effects.   Cognitive Features That Contribute To Risk:  No gross cognitive deficits noted upon discharge. Is alert , attentive, and oriented x 3   Suicide Risk:  Mild:  Suicidal ideation of limited frequency, intensity, duration, and specificity.  There are no identifiable plans, no associated intent, mild dysphoria and related symptoms, good self-control (both objective and subjective assessment), few other risk factors, and identifiable protective factors, including available and accessible social support.    Plan Of Care/Follow-up recommendations:  Activity:  as tolerated  Diet:  regular Tests:  NA Other:  see below  Patient is leaving unit in good spirits .  Plans to follow up at [facility]. Plans  to return to live with his brother. States he is motivated in maintaining sobriety.  Neita Garnet, MD 10/17/2015, 10:51 AM

## 2015-10-17 NOTE — Progress Notes (Signed)
Patient ID: Justin Chaney, male   DOB: [birth year], 20 y.o.   MRN: [id number]  Pt discharged home with a bus pass. Pt was stable and appreciative at that time. All papers and prescriptions were given and valuables returned. Verbal understanding expressed. Denies SI/HI and A/VH. Pt given opportunity to express concerns and ask questions.

## 2015-10-17 NOTE — Tx Team (Signed)
Interdisciplinary Treatment Plan Update (Adult) Date: 10/17/2015   Date: 10/17/2015 11:15 AM  Progress in Treatment:  Attending groups: No  Participating in groups: No  Taking medication as prescribed: Yes  Tolerating medication: Yes  Family/Significant othe contact made: No, Pt declines Patient understands diagnosis: Limited insight Discussing patient identified problems/goals with staff: Yes  Medical problems stabilized or resolved: Yes  Denies suicidal/homicidal ideation: Yes Patient has not harmed self or Others: Yes   New problem(s) identified: None identified at this time.   Discharge Plan or Barriers: CSW will assess for appropriate discharge plan and relevant barriers.   10/17/15: Pt discharging to brother's home. Reports that he does not need follow-up, but Pt informed about [facility] in the event that he needs services.   Additional comments:  Patient and CSW reviewed pt's identified goals and treatment plan. Patient verbalized understanding and agreed to treatment plan. CSW reviewed [facility] "Discharge Process and Patient Involvement" Form. Pt verbalized understanding of information provided and signed form.   Reason for Continuation of Hospitalization:  Depression Medication stabilization Suicidal ideation  Estimated length of stay: 0 days  Review of initial/current patient goals per problem list:   1.  Goal(s): Patient will participate in aftercare plan  Met:  Yes  Target date: 3-5 days from date of admission   As evidenced by: Patient will participate within aftercare plan AEB aftercare provider and housing plan at discharge being identified.  10/13/15: CSW to work with Pt to assess for appropriate discharge plan and faciliate appointments and referrals as needed prior to d/c. 10/17/15: Pt discharging to brother's home. Reports that he does not need follow-up, but Pt informed about [facility] in the event that he needs services.   2.  Goal (s): Patient will exhibit  decreased depressive symptoms and suicidal ideations.  Met:  Yes  Target date: 3-5 days from date of admission   As evidenced by: Patient will utilize self rating of depression at 3 or below and demonstrate decreased signs of depression or be deemed stable for discharge by MD.  10/13/15: Pt rates depression at 10/10; endorses passive SI  10/17/15: Pt rates depression at 0/10; denies SI  Attendees:  Patient:    Family:    Physician: Dr. Parke Poisson, MD  10/17/2015 11:15 AM  Nursing: Lars Pinks, RN Case manager  10/17/2015 11:15 AM  Clinical Social Worker [name], [place] 10/17/2015 11:15 AM  Other: Erasmo Downer Drinkard, LCSWA 10/17/2015 11:15 AM  Clinical:  Marcella Dubs, RN; Grayland Ormond, RN 10/17/2015 11:15 AM  Other: , RN Charge Nurse 10/17/2015 11:15 AM  Other: Hilda Lias, [place], [place] Work [phone]

## 2015-10-17 NOTE — Progress Notes (Signed)
Patient ID: Justin Chaney, male   DOB: 1996-02-21, 20 y.o.   MRN: [id number]   Pt currently presents with a flat affect and agitated behavior. Per self inventory, pt rates depression, hopelessness and anxiety at a 0. Pt reports no daily goal." Pt reports good sleep, a fair appetite, normal energy and good concentration. Pt reports to writer this morning, "I'm tired, bring my medicines to me, you can put them write here."   Pt provided with medications per providers orders. Pt's labs and vitals were monitored throughout the day. Pt supported emotionally and encouraged to express concerns and questions. Pt educated on medications.  Pt's safety ensured with 15 minute and environmental checks. Pt currently denies SI/HI and A/V hallucinations. Pt verbally agrees to seek staff if SI/HI or A/VH occurs and to consult with staff before acting on these thoughts. Pt states that once discharged he will be going to his brothers house. Will continue POC.

## 2015-10-17 NOTE — Progress Notes (Signed)
  [facility] Adult Case Management Discharge Plan :  Will you be returning to the same living situation after discharge:  No. Pt discharging to brother's home At discharge, do you have transportation home?: Yes,  Pt provided with bus pass Do you have the ability to pay for your medications: Yes,  Pt provided with prescriptions  Release of information consent forms completed and in the chart;  Patient's signature needed at discharge.  Patient to Follow up at: Follow-up Information    Follow up with [facility].   Specialty:  Behavioral Health   Why:  Please walk-in between Monday-Friday 8am-3pm to be evaluated for medication management and therapy.   Contact information:   [place] [address] [phone]       Next level of care provider has access to [place] and Suicide Prevention discussed: Yes,  with Pt; declines family contact  Have you used any form of tobacco in the last 30 days? (Cigarettes, Smokeless Tobacco, Cigars, and/or Pipes): Yes  Has patient been referred to the Quitline?: Patient refused referral  Patient has been referred for addiction treatment: Yes- see above  Bo Mcclintock 10/17/2015, 11:18 AM

## 2015-10-17 NOTE — Discharge Summary (Signed)
Physician Discharge Summary Note  Patient:  Justin Chaney is an 20 y.o., male MRN:  [id number] DOB:  02-25-1996  Patient phone:  [phone] (home)   Patient address:   [place] [address],   Total Time spent with patient: Greater than 30 minutes  Date of Admission:  10/11/2015  Date of Discharge: 10/17/2015  Reason for Admission: Worsening symptoms of depression triggering suicidal ideations.  Principal Problem: MDD (major depressive disorder) [facility])  Discharge Diagnoses: Patient Active Problem List   Diagnosis Date Noted  . Severe single current episode of major depressive disorder, without psychotic features ([place]) [F32.2]   . Cocaine abuse [F14.10]   . Schizoaffective disorder ([place]) [F25.9]   . MDD (major depressive disorder), recurrent, severe, with psychosis ([place]) [F33.3] 10/11/2015  . MDD (major depressive disorder) ([place]) [F32.9] 10/11/2015  . Suicidal thoughts [R45.851]   . Intermittent explosive disorder [F63.81] 09/05/2015  . Schizoaffective disorder, bipolar type ([place]) [F25.0] 09/04/2015  . Non compliance w medication regimen [Z91.14] 01/17/2015  . Substance induced mood disorder ([place]) [F19.94] 02/08/2014  . 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 Cannabis abuse [F12.10] 08/15/2012   Past Psychiatric History: HX. Schizoaffective disorder, bipolar-type.  Past Medical History:  Past Medical History  Diagnosis Date  . Asthma   . Seizures ([place])   . Brain ventricular shunt obstruction     hydrochelpis  . Depression   . Bipolar disorder [facility])     Past Surgical History  Procedure Laterality Date  . Tonsillectomy    . Ventriculo-peritoneal shunt placement / laparoscopic insertion peritoneal catheter     Family History:  Family History  Problem Relation Age of Onset  . Hypertension Mother   . Mental illness Neg Hx    Family Psychiatric  History: See H&P  Social History:  History  Alcohol Use  . 7.2 oz/week   . 12 Cans of beer per week    Comment: 4-8 40 oz.on the weekend     History  Drug Use  . Yes  . Special: Marijuana, Other-see comments, Amphetamines    Comment: Adderall, "I don't do weed, only rarely"    Social History   Social History  . Marital Status: Single    Spouse Name: N/A  . Number of Children: N/A  . Years of Education: N/A   Social History Main Topics  . Smoking status: Current Every Day Smoker -- 1.00 packs/day    Types: Cigarettes  . Smokeless tobacco: None  . Alcohol Use: 7.2 oz/week    12 Cans of beer per week     Comment: 4-8 40 oz.on the weekend  . Drug Use: Yes    Special: Marijuana, Other-see comments, Amphetamines     Comment: Adderall, "I don't do weed, only rarely"  . Sexual Activity: Not Asked   Other Topics Concern  . None   Social History Narrative    Hospital Course:  Arafat Winkle 20 yr old black male admitted to [facility] after presenting to [facility] with complaints of suicidal ideation with a plan to cut his throat and thinking of multiple other ways that he could kill himself. Patient recently discharged for [facility] 09/13/15 and did not follow up with outpatient services [place] 10/06/15. Today patient is in the bed and refuses to get up. Patient is agitated and states "I just got through talking to that other lady. Why I got to talk to you to?" Explained to patient that he would be  spoken with on several occasions during his admission and his stay at the hospital and that everyone played different roles in his care; but we would all have to speak to him to make sure we were providing the care that would best benefit him. Patient states that he does not want to talk. Spoke with Tech, Nursing, and Social Work who all reports that patient has not been participating in group session, refusing his medications, and not answering any questions and has been rude to everyone. Reviewed notes from patient last admission and shows the  same pattern of patient refusing medications, group sessions.  Tulio was a patient in this hospital from 09-04-15 thru 09-13-15. And during this time frame, he received mood stabilization treatments due to worsening symptoms of depression. He was discharged [facility] [place], [place] for medication management & routine psychiatric care. However, on 10-11-15, barely 30 days after discharged from this hospital, Tyrickey presented for admission citing  worsening symptoms of depression with suicidal ideations & plans. He was again re-admitted to the [facility] adult unit for further mood stabilization treatments. After admission assessment, his current presenting symptoms were identified. The medication regimen targeting those symptoms were discussed & initiated. He was medicated & discharged on; Abilify10 mg for mood control, Hydroxyzine 25 mg for anxiety, Lithium Carbonate 300 mg & 600 mg respectively for mood control. He presented no other medical ailments that required treatment. He tolerated his treatment regimen without any adverse effects reported.  As Tyrick's treatment progressed, improvement was monitored & noted by observation of his daily reports of symptom reduction. His emotional & mental status were also monitored by daily self-inventory assessment reports completed by Ilir & the clinical staff. He was evaluated by the treatment team for mood stability and plans for continued recovery after discharge. Clemon's motivation was an integral factor in his mood stability. He was recommended further treatment options upon discharge by referring & scheduling him an outpatient psychiatric clinic for follow-up visits & medication managment as listed below.   Upon discharge, Consuelo was both mentally and medically stable for discharge. He is currently denying SIHI, auditory/visual/tactile hallucinations, delusional thoughts & or paranoia. He left [facility] in no  apparent distress with all personal belongings. Transportation per city bus. [place] assisted with bus pass. Lithium level 0.25.  Physical Findings: AIMS: Facial and Oral Movements Muscles of Facial Expression: None, normal Lips and Perioral Area: None, normal Jaw: None, normal Tongue: None, normal,Extremity Movements Upper (arms, wrists, hands, fingers): None, normal Lower (legs, knees, ankles, toes): None, normal, Trunk Movements Neck, shoulders, hips: None, normal, Overall Severity Severity of abnormal movements (highest score from questions above): None, normal Incapacitation due to abnormal movements: None, normal Patient's awareness of abnormal movements (rate only patient's report): No Awareness, Dental Status Current problems with teeth and/or dentures?: No Does patient usually wear dentures?: No  CIWA:  CIWA-Ar Total: 1 COWS:  COWS Total Score: 2  Musculoskeletal: Strength & Muscle Tone: within normal limits Gait & Station: normal Patient leans: N/A  Psychiatric Specialty Exam: SEE SRA BY MD  Review of Systems  Constitutional: Negative.   HENT: Negative.   Eyes: Negative.   Respiratory: Negative.   Cardiovascular: Negative.   Gastrointestinal: Negative.   Genitourinary: Negative.   Musculoskeletal: Negative.   Skin: Negative.   Neurological: Negative.   Endo/Heme/Allergies: Negative.   Psychiatric/Behavioral: Positive for depression (Stable ) and substance abuse (Hx. Cocaine abuse). Negative for suicidal ideas, hallucinations and memory loss. The  patient has insomnia (Stable). The patient is not nervous/anxious.   All other systems reviewed and are negative. See Md's SRA  Blood pressure 109/59, pulse 96, temperature 98.5 F (36.9 C), temperature source Oral, resp. rate 19, height 5\' 3"  (1.6 m), weight 102.059 kg (225 lb), SpO2 97 %.Body mass index is 39.87 kg/(m^2).  Have you used any form of tobacco in the last 30 days? (Cigarettes, Smokeless Tobacco, Cigars, and/or  Pipes): Yes  Has this patient used any form of tobacco in the last 30 days? (Cigarettes, Smokeless Tobacco, Cigars, and/or Pipes): Yes, A prescription for an FDA-approved tobacco cessation medication was offered at discharge and the patient refused  Metabolic Disorder Labs:  Lab Results  Component Value Date   HGBA1C 5.0 09/09/2015   MPG 97 09/09/2015   Lab Results  Component Value Date   PROLACTIN 8.0 09/09/2015   Lab Results  Component Value Date   CHOL 133 09/09/2015   TRIG 70 09/09/2015   HDL 35* 09/09/2015   CHOLHDL 3.8 09/09/2015   VLDL 14 09/09/2015   LDLCALC 84 09/09/2015   See Psychiatric Specialty Exam and Suicide Risk Assessment completed by Attending Physician prior to discharge.  Discharge destination:  Home  Is patient on multiple antipsychotic therapies at discharge:  No   Has Patient had three or more failed trials of antipsychotic monotherapy by history:  No  Recommended Plan for Multiple Antipsychotic Therapies: NA    Medication List    TAKE these medications      Indication   ARIPiprazole 10 MG tablet  Commonly known as:  ABILIFY  Take 1 tablet (10 mg total) by mouth daily. For mood control   Indication:  Mood control     hydrOXYzine 25 MG tablet  Commonly known as:  ATARAX/VISTARIL  Take 1 tablet (25 mg total) by mouth 3 (three) times daily as needed for anxiety.   Indication:  Anxiety     lithium 600 MG capsule  Take 1 capsule (600 mg total) by mouth 2 (two) times daily with a meal. For mood stabilization   Indication:  Mood stablilzation     nicotine 21 mg/24hr patch  Commonly known as:  NICODERM CQ - dosed in mg/24 hours  Place 1 patch (21 mg total) onto the skin daily. For smoking cessation   Indication:  Nicotine Addiction       Follow-up recommendations: Activity:  As tolerated Diet: As recommended by your primary care doctor. Keep all scheduled follow-up appointments as recommended.   Comments:  Take all your medications as  prescribed by your mental healthcare provider. Report any adverse effects and or reactions from your medicines to your outpatient provider promptly. Patient is instructed and cautioned to not engage in alcohol and or illegal drug use while on prescription medicines. In the event of worsening symptoms, patient is instructed to call the crisis hotline, 911 and or go to the nearest ED for appropriate evaluation and treatment of symptoms. Follow-up with your primary care provider for your other medical issues, concerns and or health care needs.  Signed: Encarnacion Slates, NP, PMHNP-BC 10/17/2015, 11:10 AM   Patient seen, Suicide Assessment Completed.  Disposition Plan Reviewed

## 2015-10-29 DIAGNOSIS — F32A Depression, unspecified: Secondary | ICD-10-CM | POA: Insufficient documentation

## 2015-10-29 DIAGNOSIS — F329 Major depressive disorder, single episode, unspecified: Secondary | ICD-10-CM | POA: Insufficient documentation

## 2015-11-05 ENCOUNTER — Emergency Department (HOSPITAL_COMMUNITY)
Admission: EM | Admit: 2015-11-05 | Discharge: 2015-11-06 | Disposition: A | Discharge status: Other Acute Inpatient Hospital | Payer: BLUE CROSS/BLUE SHIELD | Attending: Emergency Medicine | Admitting: Emergency Medicine

## 2015-11-05 ENCOUNTER — Encounter (HOSPITAL_COMMUNITY): Payer: Self-pay | Admitting: Emergency Medicine

## 2015-11-05 DIAGNOSIS — F250 Schizoaffective disorder, bipolar type: Secondary | ICD-10-CM | POA: Diagnosis not present

## 2015-11-05 DIAGNOSIS — Z880 Allergy status to penicillin: Secondary | ICD-10-CM | POA: Diagnosis not present

## 2015-11-05 DIAGNOSIS — F6381 Intermittent explosive disorder: Secondary | ICD-10-CM | POA: Diagnosis present

## 2015-11-05 DIAGNOSIS — F22 Delusional disorders: Secondary | ICD-10-CM | POA: Insufficient documentation

## 2015-11-05 DIAGNOSIS — Z79899 Other long term (current) drug therapy: Secondary | ICD-10-CM | POA: Diagnosis not present

## 2015-11-05 DIAGNOSIS — R441 Visual hallucinations: Secondary | ICD-10-CM | POA: Diagnosis present

## 2015-11-05 DIAGNOSIS — Z87891 Personal history of nicotine dependence: Secondary | ICD-10-CM | POA: Insufficient documentation

## 2015-11-05 DIAGNOSIS — J45909 Unspecified asthma, uncomplicated: Secondary | ICD-10-CM | POA: Insufficient documentation

## 2015-11-05 LAB — SALICYLATE LEVEL: Salicylate Lvl: <4 mg/dL (ref 2.8–30.0)

## 2015-11-05 LAB — RAPID URINE DRUG SCREEN, HOSP PERFORMED
Amphetamines: NOT DETECTED
Barbiturates: NOT DETECTED
Benzodiazepines: NOT DETECTED
Cocaine: NOT DETECTED
Opiates: NOT DETECTED
Tetrahydrocannabinol: NOT DETECTED

## 2015-11-05 LAB — ETHANOL: Alcohol, Ethyl (B): <5 mg/dL (ref <5)

## 2015-11-05 LAB — COMPREHENSIVE METABOLIC PANEL
ALT: 41 U/L (ref 17–63)
AST: 26 U/L (ref 15–41)
Albumin: 4.7 g/dL (ref 3.5–5.0)
Alkaline Phosphatase: 82 U/L (ref 38–126)
Anion gap: 10 (ref 5–15)
BUN: 11 mg/dL (ref 6–20)
CO2: 23 mmol/L (ref 22–32)
Calcium: 9.5 mg/dL (ref 8.9–10.3)
Chloride: 106 mmol/L (ref 101–111)
Creatinine, Ser: 0.9 mg/dL (ref 0.61–1.24)
GFR calc Af Amer: >60 mL/min (ref >60)
GFR calc non Af Amer: >60 mL/min (ref >60)
Glucose, Bld: 107 mg/dL — ABNORMAL HIGH (ref 65–99)
Potassium: 3.4 mmol/L — ABNORMAL LOW (ref 3.5–5.1)
Sodium: 139 mmol/L (ref 135–145)
Total Bilirubin: 0.8 mg/dL (ref 0.3–1.2)
Total Protein: 7.8 g/dL (ref 6.5–8.1)

## 2015-11-05 LAB — CBC
HCT: 46.4 % (ref 39.0–52.0)
Hemoglobin: 16 g/dL (ref 13.0–17.0)
MCH: 28.3 pg (ref 26.0–34.0)
MCHC: 34.5 g/dL (ref 30.0–36.0)
MCV: 82 fL (ref 78.0–100.0)
Platelets: 243 10*3/uL (ref 150–400)
RBC: 5.66 MIL/uL (ref 4.22–5.81)
RDW: 12 % (ref 11.5–15.5)
WBC: 10.4 10*3/uL (ref 4.0–10.5)

## 2015-11-05 LAB — ACETAMINOPHEN LEVEL: Acetaminophen (Tylenol), Serum: <10 ug/mL — ABNORMAL LOW (ref 10–30)

## 2015-11-05 LAB — LITHIUM LEVEL: Lithium Lvl: 0.27 mmol/L — ABNORMAL LOW (ref 0.60–1.20)

## 2015-11-05 MED ORDER — HYDROXYZINE HCL 25 MG PO TABS: 25.0000 mg | ORAL_TABLET | Freq: Three times a day (TID) | ORAL | Status: DC | PRN

## 2015-11-05 MED ORDER — ACETAMINOPHEN 325 MG PO TABS: 650.0000 mg | ORAL_TABLET | ORAL | Status: DC | PRN

## 2015-11-05 MED ORDER — NICOTINE 21 MG/24HR TD PT24: 21.0000 mg | MEDICATED_PATCH | Freq: Every day | TRANSDERMAL | Status: DC

## 2015-11-05 MED ORDER — ONDANSETRON HCL 4 MG PO TABS: 4.0000 mg | ORAL_TABLET | Freq: Three times a day (TID) | ORAL | Status: DC | PRN

## 2015-11-05 MED ORDER — LITHIUM CARBONATE 300 MG PO CAPS
600.0000 mg | ORAL_CAPSULE | Freq: Two times a day (BID) | ORAL | Status: DC
  Administered 2015-11-06: 600 mg via ORAL
  Filled 2015-11-05: qty 2

## 2015-11-05 NOTE — ED Provider Notes (Signed)
CSN: [id number]     Arrival date & time 11/05/15  [id number] History  By signing my name below, I, Justin Chaney, attest that this documentation has been prepared under the direction and in the presence of non-physician practitioner, Antonietta Breach, PA-C. Electronically Signed: Rowan Chaney, Scribe. 11/05/2015. 8:34 PM.   Chief Complaint  Patient presents with  . Hallucinations   The history is provided by the patient. No language interpreter was used.   HPI Comments:  Justin Chaney is a 20 y.o. male with PMHx of depression and bipolar disorder who presents to the Emergency Department complaining of auditory and visual hallucinations. Pt states "I am the antichrist and satan's brother"; he reports has had these thoughts forever. Pt reports occasional thoughts of hurting others, but denies any currently. No alleviating factors noted. He states he is seeking admission today and has been admitted previously for SI and schizophrenia. He reports he has used cocaine in the past and has been clean for a few weeks. Pt takes Lithium currently. Denies suicidal intentions or any other prescribed medications.  Past Medical History  Diagnosis Date  . Asthma   . Seizures ([place])   . Brain ventricular shunt obstruction     hydrochelpis  . Depression   . Bipolar disorder [facility])    Past Surgical History  Procedure Laterality Date  . Tonsillectomy    . Ventriculo-peritoneal shunt placement / laparoscopic insertion peritoneal catheter     Family History  Problem Relation Age of Onset  . Hypertension Mother   . Mental illness Neg Hx    Social History  Substance Use Topics  . Smoking status: Former Smoker -- 1.00 packs/day    Types: Cigarettes    Quit date: 10/22/2015  . Smokeless tobacco: None  . Alcohol Use: 7.2 oz/week    12 Cans of beer per week     Comment: states he no longer drinks     Review of Systems  Psychiatric/Behavioral: Positive for hallucinations. Negative for suicidal ideas.  All  other systems reviewed and are negative.  Allergies  Amoxicillin and Penicillins  Home Medications   Prior to Admission medications   Medication Sig Start Date End Date Taking? Authorizing Provider  lithium carbonate 300 MG capsule Take 300 mg by mouth daily. 10/27/15  Yes Historical Provider, MD  lithium carbonate 600 MG capsule Take 1 capsule (600 mg total) by mouth 2 (two) times daily with a meal. For mood stabilization Patient taking differently: Take 600 mg by mouth at bedtime. For mood stabilization 10/17/15  Yes Encarnacion Slates, NP  lurasidone (LATUDA) 40 MG TABS tablet Take 80 mg by mouth daily with breakfast.   Yes Historical Provider, MD  ARIPiprazole (ABILIFY) 10 MG tablet Take 1 tablet (10 mg total) by mouth daily. For mood control Patient not taking: Reported on 11/05/2015 10/17/15   Encarnacion Slates, NP  nicotine (NICODERM CQ - DOSED IN MG/24 HOURS) 21 mg/24hr patch Place 1 patch (21 mg total) onto the skin daily. For smoking cessation Patient not taking: Reported on 11/05/2015 10/17/15   Encarnacion Slates, NP   BP 131/82 mmHg  Pulse 93  Temp(Src) 98.7 F (37.1 C) (Oral)  Resp 20  Ht 5\' 3"  (1.6 m)  Wt 102.967 kg  BMI 40.22 kg/m2  SpO2 98%   Physical Exam  Constitutional: He is oriented to person, place, and time. He appears well-developed and well-nourished. No distress.  HENT:  Head: Normocephalic and atraumatic.  Eyes: Conjunctivae and EOM are normal. No  scleral icterus.  Neck: Normal range of motion.  Pulmonary/Chest: Effort normal. No respiratory distress.  Musculoskeletal: Normal range of motion.  Neurological: He is alert and oriented to person, place, and time. He exhibits normal muscle tone. Coordination normal.  Skin: Skin is warm and dry. No rash noted. He is not diaphoretic. No erythema. No pallor.  Psychiatric: He has a normal mood and affect. His speech is normal and behavior is normal. Thought content is delusional. He expresses no homicidal and no suicidal ideation.   Patient calm, cooperative  Nursing note and vitals reviewed.   ED Course  Procedures  DIAGNOSTIC STUDIES:  Oxygen Saturation is 98% on RA, normal by my interpretation.    COORDINATION OF CARE:  8:34 PM Discussed treatment plan with pt at bedside and pt agreed to plan.  Labs Review Labs Reviewed  COMPREHENSIVE METABOLIC PANEL - Abnormal; Notable for the following:    Potassium 3.4 (*)    Glucose, Bld 107 (*)    All other components within normal limits  ACETAMINOPHEN LEVEL - Abnormal; Notable for the following:    Acetaminophen (Tylenol), Serum <10 (*)    All other components within normal limits  LITHIUM LEVEL - Abnormal; Notable for the following:    Lithium Lvl 0.27 (*)    All other components within normal limits  ETHANOL  SALICYLATE LEVEL  CBC  URINE RAPID DRUG SCREEN, HOSP PERFORMED    Imaging Review No results found.   I have personally reviewed and evaluated these images and lab results as part of my medical decision-making.   EKG Interpretation None      MDM   Final diagnoses:  Schizoaffective disorder, bipolar type [facility])    Patient medically cleared. He is pending further inpatient management; TTS seeking placement. Disposition to be determined by oncoming ED provider.  I personally performed the services described in this documentation, which was scribed in my presence. The recorded information has been reviewed and is accurate.    Filed Vitals:   11/05/15 1931  BP: 131/82  Pulse: 93  Temp: 98.7 F (37.1 C)  TempSrc: Oral  Resp: 20  Height: 5\' 3"  (1.6 m)  Weight: 102.967 kg  SpO2: 98%       Antonietta Breach, PA-C 11/06/15 0543  Merrily Pew, MD 11/06/15 1109

## 2015-11-05 NOTE — ED Notes (Signed)
"  I believe I am the antichrist" "i am seeing things and hearing things other people do not see or hear" "I believe I am satan's brother"  "I believe the world will bow down to me one day" Pt states he has been seen for SI in the past but does not feel suicidal today. Pt requesting admission to [facility].

## 2015-11-05 NOTE — ED Notes (Signed)
Pt discharged from [facility] today, states he went to [facility] in [place] and staff called 911 stating he needed help.  Pt thinks he is the Bessie, [employer] and not human.  Pt also states he is God.  Pt reports he is hearing and seeing things.  Denies HI or feeling hopeless.  Monitoring for safety, Q 15 min checks in effect, no distress noted.

## 2015-11-06 ENCOUNTER — Inpatient Hospital Stay (HOSPITAL_COMMUNITY)
Admission: AD | Admit: 2015-11-06 | Discharge: 2015-11-13 | DRG: 885 | Disposition: A | Discharge status: Home / Self Care | Payer: BLUE CROSS/BLUE SHIELD | Source: Intra-hospital | Attending: Psychiatry | Admitting: Psychiatry

## 2015-11-06 ENCOUNTER — Encounter (HOSPITAL_COMMUNITY): Payer: Self-pay | Admitting: *Deleted

## 2015-11-06 DIAGNOSIS — Z9119 Patient's noncompliance with other medical treatment and regimen: Secondary | ICD-10-CM

## 2015-11-06 DIAGNOSIS — F1420 Cocaine dependence, uncomplicated: Secondary | ICD-10-CM | POA: Diagnosis present

## 2015-11-06 DIAGNOSIS — F250 Schizoaffective disorder, bipolar type: Principal | ICD-10-CM | POA: Diagnosis present

## 2015-11-06 DIAGNOSIS — F1290 Cannabis use, unspecified, uncomplicated: Secondary | ICD-10-CM | POA: Diagnosis present

## 2015-11-06 DIAGNOSIS — R45851 Suicidal ideations: Secondary | ICD-10-CM | POA: Diagnosis not present

## 2015-11-06 DIAGNOSIS — Z982 Presence of cerebrospinal fluid drainage device: Secondary | ICD-10-CM

## 2015-11-06 DIAGNOSIS — F1221 Cannabis dependence, in remission: Secondary | ICD-10-CM | POA: Clinically undetermined

## 2015-11-06 DIAGNOSIS — F1520 Other stimulant dependence, uncomplicated: Secondary | ICD-10-CM | POA: Diagnosis present

## 2015-11-06 DIAGNOSIS — F6381 Intermittent explosive disorder: Secondary | ICD-10-CM

## 2015-11-06 MED ORDER — LURASIDONE HCL 40 MG PO TABS
40.0000 mg | ORAL_TABLET | Freq: Every day | ORAL | Status: DC
  Administered 2015-11-06: 40 mg via ORAL
  Filled 2015-11-06: qty 1

## 2015-11-06 MED ORDER — TRAZODONE HCL 50 MG PO TABS: 50.0000 mg | ORAL_TABLET | Freq: Every day | ORAL | Status: DC

## 2015-11-06 MED ORDER — LITHIUM CARBONATE ER 450 MG PO TBCR
450.0000 mg | EXTENDED_RELEASE_TABLET | Freq: Two times a day (BID) | ORAL | Status: DC
  Administered 2015-11-07 – 2015-11-13 (×13): 450 mg via ORAL
  Filled 2015-11-06 (×20): qty 1

## 2015-11-06 MED ORDER — ACETAMINOPHEN 325 MG PO TABS: 650.0000 mg | ORAL_TABLET | ORAL | Status: DC | PRN

## 2015-11-06 MED ORDER — NICOTINE 21 MG/24HR TD PT24
21.0000 mg | MEDICATED_PATCH | Freq: Every day | TRANSDERMAL | Status: DC
  Filled 2015-11-06 (×6): qty 1

## 2015-11-06 MED ORDER — ALUM & MAG HYDROXIDE-SIMETH 200-200-20 MG/5ML PO SUSP: 30.0000 mL | ORAL | Status: DC | PRN

## 2015-11-06 MED ORDER — LURASIDONE HCL 40 MG PO TABS
40.0000 mg | ORAL_TABLET | Freq: Every day | ORAL | Status: DC
  Filled 2015-11-06 (×3): qty 1

## 2015-11-06 MED ORDER — LITHIUM CARBONATE ER 450 MG PO TBCR
450.0000 mg | EXTENDED_RELEASE_TABLET | Freq: Two times a day (BID) | ORAL | Status: DC
  Administered 2015-11-06: 450 mg via ORAL
  Filled 2015-11-06 (×2): qty 1

## 2015-11-06 MED ORDER — HYDROXYZINE HCL 25 MG PO TABS: 25.0000 mg | ORAL_TABLET | Freq: Three times a day (TID) | ORAL | Status: DC | PRN

## 2015-11-06 MED ORDER — TRAZODONE HCL 50 MG PO TABS
50.0000 mg | ORAL_TABLET | Freq: Every day | ORAL | Status: DC
  Administered 2015-11-12 (×2): 50 mg via ORAL
  Filled 2015-11-06 (×10): qty 1

## 2015-11-06 MED ORDER — ACETAMINOPHEN 325 MG PO TABS: 650.0000 mg | ORAL_TABLET | Freq: Four times a day (QID) | ORAL | Status: DC | PRN

## 2015-11-06 MED ORDER — MAGNESIUM HYDROXIDE 400 MG/5ML PO SUSP
30.0000 mL | Freq: Every day | ORAL | Status: DC | PRN
Start: 2015-11-06 — End: 2015-11-13

## 2015-11-06 MED ORDER — ONDANSETRON HCL 4 MG PO TABS: 4.0000 mg | ORAL_TABLET | Freq: Three times a day (TID) | ORAL | Status: DC | PRN

## 2015-11-06 NOTE — Consult Note (Signed)
[facility] Consult Reason for Consult:  Auditory/visual hallucination, suicidal thoughts off and off Referring Physician:  EDP Patient Identification: Justin Chaney MRN:  [id number] Principal Diagnosis: Schizoaffective disorder, bipolar type [facility]) Diagnosis:   Patient Active Problem List   Diagnosis Date Noted  . Severe single current episode of major depressive disorder, without psychotic features ([place]) [F32.2]   . Cocaine abuse [F14.10]   . Schizoaffective disorder ([place]) [F25.9]   . MDD (major depressive disorder), recurrent, severe, with psychosis ([place]) [F33.3] 10/11/2015  . MDD (major depressive disorder) ([place]) [F32.9] 10/11/2015  . Suicidal thoughts [R45.851]   . Intermittent explosive disorder [F63.81] 09/05/2015  . Schizoaffective disorder, bipolar type ([place]) [F25.0] 09/04/2015  . Non compliance w medication regimen [Z91.14] 01/17/2015  . Substance induced mood disorder ([place]) [F19.94] 02/08/2014  . 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 Cannabis abuse [F12.10] 08/15/2012    Total Time spent with patient: 45 minutes  Subjective:   Justin Chaney is a 20 y.o. male patient admitted with  Auditory/visual hallucination, suicidal thoughts off and off  HPI:  AA male, 20 years old was evaluated today [facility] / Visual [facility]ination.  Patient is well known to our service and has been hospitalized several times to [facility] and [facility] in [place].  Patient states " I am the Antichrist and satan brother"  Patient  States that he has had this thought for a long time, Patient reports having suicidal thoughts off and off but denies suicidal thoughts today.  Patient reports poor sleep and states that he does not think his medications are effective.  Patient is asking for a long term care facility.  We are seeking placement at any facility that have available beds for acute care.  Past  Psychiatric History:   Schizoaffective disorder, Bipolar type, Intermittent explosive disorder  Risk to Self: Suicidal Ideation: No Suicidal Intent: No Is patient at risk for suicide?: No Suicidal Plan?: No Specify Current Suicidal Plan: Pt denies having a plan.  Access to Means: No Specify Access to Suicidal Means: N/A What has been your use of drugs/alcohol within the last 12 months?: Amphetamine and cocaine use reported.  How many times?: 7 Other Self Harm Risks: Pt denies  Triggers for Past Attempts: Unpredictable Intentional Self Injurious Behavior: None Risk to Others: Homicidal Ideation: No Thoughts of Harm to Others: No Comment - Thoughts of Harm to Others: Pt denies  Current Homicidal Intent: No Current Homicidal Plan: No Describe Current Homicidal Plan: N/A Access to Homicidal Means: No Describe Access to Homicidal Means: Pt denies Identified Victim: Pt denies History of harm to others?: No Assessment of Violence: None Noted Violent Behavior Description: No violent behaviors observed.  Does patient have access to weapons?: No Criminal Charges Pending?: No Does patient have a court date: No Prior Inpatient Therapy: Prior Inpatient Therapy: Yes Prior Therapy Dates: 2017, 2016, 2015 Prior Therapy Facilty/Provider(s): [facility], [place], [place], [facility]  Reason for Treatment: Bipolar, Depression and Substance Abuse  Prior Outpatient Therapy: Prior Outpatient Therapy: No Prior Therapy Dates: None Reported Prior Therapy Facilty/Provider(s): None Reported Reason for Treatment: NA Does patient have an ACCT team?: No Does patient have Intensive In-House Services?  : No Does patient have [facility] services? : No Does patient have P4CC services?: No  Past Medical History:  Past Medical History  Diagnosis Date  . Asthma   . Seizures ([place])   . Brain ventricular shunt obstruction  hydrochelpis  . Depression   . Bipolar disorder [facility])     Past Surgical History  Procedure  Laterality Date  . Tonsillectomy    . Ventriculo-peritoneal shunt placement / laparoscopic insertion peritoneal catheter     Family History:  Family History  Problem Relation Age of Onset  . Hypertension Mother   . Mental illness Neg Hx    Family Psychiatric  History:  Denies Social History:  History  Alcohol Use  . 7.2 oz/week  . 12 Cans of beer per week    Comment: states he no longer drinks      History  Drug Use  . Yes  . Special: Marijuana, Other-see comments, Amphetamines    Comment: Adderall, "I don't do weed, only rarely"    Social History   Social History  . Marital Status: Single    Spouse Name: N/A  . Number of Children: N/A  . Years of Education: N/A   Social History Main Topics  . Smoking status: Former Smoker -- 1.00 packs/day    Types: Cigarettes    Quit date: 10/22/2015  . Smokeless tobacco: None  . Alcohol Use: 7.2 oz/week    12 Cans of beer per week     Comment: states he no longer drinks   . Drug Use: Yes    Special: Marijuana, Other-see comments, Amphetamines     Comment: Adderall, "I don't do weed, only rarely"  . Sexual Activity: Not Asked   Other Topics Concern  . None   Social History Narrative   Additional Social History:    Allergies:   Allergies  Allergen Reactions  . Amoxicillin Hives  . Penicillins Hives and Rash    Has patient had a PCN reaction causing immediate rash, facial/tongue/throat swelling, SOB or lightheadedness with hypotension: unknown Has patient had a PCN reaction causing severe rash involving mucus membranes or skin necrosis: Yes Has patient had a PCN reaction that required hospitalization :unknown Has patient had a PCN reaction occurring within the last 10 years: unknown If all of the above answers are "NO", then may proceed with Cephalosporin use.     Labs:  Results for orders placed or performed during the hospital encounter of 11/05/15 (from the past 48 hour(s))  Urine rapid drug screen (hosp  performed) (Not at [facility])     Status: None   Collection Time: 11/05/15  8:17 PM  Result Value Ref Range   Opiates NONE DETECTED NONE DETECTED   Cocaine NONE DETECTED NONE DETECTED   Benzodiazepines NONE DETECTED NONE DETECTED   Amphetamines NONE DETECTED NONE DETECTED   Tetrahydrocannabinol NONE DETECTED NONE DETECTED   Barbiturates NONE DETECTED NONE DETECTED    Comment:        DRUG SCREEN FOR MEDICAL PURPOSES ONLY.  IF CONFIRMATION IS NEEDED FOR ANY PURPOSE, NOTIFY LAB WITHIN 5 DAYS.        LOWEST DETECTABLE LIMITS FOR URINE DRUG SCREEN Drug Class       Cutoff (ng/mL) Amphetamine      1000 Barbiturate      200 Benzodiazepine   630 Tricyclics       160 Opiates          300 Cocaine          300 THC              50   Comprehensive metabolic panel     Status: Abnormal   Collection Time: 11/05/15  8:36 PM  Result Value Ref Range   Sodium  139 135 - 145 mmol/L   Potassium 3.4 (L) 3.5 - 5.1 mmol/L   Chloride 106 101 - 111 mmol/L   CO2 23 22 - 32 mmol/L   Glucose, Bld 107 (H) 65 - 99 mg/dL   BUN 11 6 - 20 mg/dL   Creatinine, Ser 0.90 0.61 - 1.24 mg/dL   Calcium 9.5 8.9 - 10.3 mg/dL   Total Protein 7.8 6.5 - 8.1 g/dL   Albumin 4.7 3.5 - 5.0 g/dL   AST 26 15 - 41 U/L   ALT 41 17 - 63 U/L   Alkaline Phosphatase 82 38 - 126 U/L   Total Bilirubin 0.8 0.3 - 1.2 mg/dL   GFR calc non Af Amer >60 >60 mL/min   GFR calc Af Amer >60 >60 mL/min    Comment: (NOTE) The eGFR has been calculated using the CKD EPI equation. This calculation has not been validated in all clinical situations. eGFR's persistently <60 mL/min signify possible Chronic Kidney Disease.    Anion gap 10 5 - 15  CBC     Status: None   Collection Time: 11/05/15  8:36 PM  Result Value Ref Range   WBC 10.4 4.0 - 10.5 K/uL   RBC 5.66 4.22 - 5.81 MIL/uL   Hemoglobin 16.0 13.0 - 17.0 g/dL   HCT 46.4 39.0 - 52.0 %   MCV 82.0 78.0 - 100.0 fL   MCH 28.3 26.0 - 34.0 pg   MCHC 34.5 30.0 - 36.0 g/dL   RDW 12.0 11.5 -  15.5 %   Platelets 243 150 - 400 K/uL  Ethanol (ETOH)     Status: None   Collection Time: 11/05/15  8:37 PM  Result Value Ref Range   Alcohol, Ethyl (B) <5 <5 mg/dL    Comment:        LOWEST DETECTABLE LIMIT FOR SERUM ALCOHOL IS 5 mg/dL FOR MEDICAL PURPOSES ONLY   Salicylate level     Status: None   Collection Time: 11/05/15  8:37 PM  Result Value Ref Range   Salicylate Lvl <9.3 2.8 - 30.0 mg/dL  Acetaminophen level     Status: Abnormal   Collection Time: 11/05/15  8:37 PM  Result Value Ref Range   Acetaminophen (Tylenol), Serum <10 (L) 10 - 30 ug/mL    Comment:        THERAPEUTIC CONCENTRATIONS VARY SIGNIFICANTLY. A RANGE OF 10-30 ug/mL MAY BE AN EFFECTIVE CONCENTRATION FOR MANY PATIENTS. HOWEVER, SOME ARE BEST TREATED AT CONCENTRATIONS OUTSIDE THIS RANGE. ACETAMINOPHEN CONCENTRATIONS >150 ug/mL AT 4 HOURS AFTER INGESTION AND >50 ug/mL AT 12 HOURS AFTER INGESTION ARE OFTEN ASSOCIATED WITH TOXIC REACTIONS.   Lithium level     Status: Abnormal   Collection Time: 11/05/15  8:37 PM  Result Value Ref Range   Lithium Lvl 0.27 (L) 0.60 - 1.20 mmol/L    Current Facility-Administered Medications  Medication Dose Route Frequency Provider Last Rate Last Dose  . acetaminophen (TYLENOL) tablet 650 mg  650 mg Oral Q4H PRN Antonietta Breach, PA-C      . hydrOXYzine (ATARAX/VISTARIL) tablet 25 mg  25 mg Oral TID PRN Antonietta Breach, PA-C      . lithium carbonate (ESKALITH) CR tablet 450 mg  450 mg Oral BID PC Tsuruko Murtha, MD      . lurasidone (LATUDA) tablet 40 mg  40 mg Oral QPC supper Analaura Messler, MD      . nicotine (NICODERM CQ - dosed in mg/24 hours) patch 21 mg  21 mg Transdermal  Daily [employer], PA-C      . ondansetron [facility]) tablet 4 mg  4 mg Oral Q8H PRN Antonietta Breach, PA-C      . traZODone (DESYREL) tablet 50 mg  50 mg Oral QHS Corena Pilgrim, MD       Current Outpatient Prescriptions  Medication Sig Dispense Refill  . lithium carbonate 300 MG capsule Take 300 mg by  mouth daily.  0  . lithium carbonate 600 MG capsule Take 1 capsule (600 mg total) by mouth 2 (two) times daily with a meal. For mood stabilization (Patient taking differently: Take 600 mg by mouth at bedtime. For mood stabilization) 60 capsule 0  . lurasidone (LATUDA) 40 MG TABS tablet Take 80 mg by mouth daily with breakfast.    . ARIPiprazole (ABILIFY) 10 MG tablet Take 1 tablet (10 mg total) by mouth daily. For mood control (Patient not taking: Reported on 11/05/2015) 30 tablet 0  . nicotine (NICODERM CQ - DOSED IN MG/24 HOURS) 21 mg/24hr patch Place 1 patch (21 mg total) onto the skin daily. For smoking cessation (Patient not taking: Reported on 11/05/2015) 28 patch 0    Musculoskeletal: Strength & Muscle Tone: within normal limits Gait & Station: normal Patient leans: N/A  Psychiatric Specialty Exam: Review of Systems  Constitutional: Negative.   HENT: Negative.   Eyes: Negative.   Cardiovascular: Negative.   Gastrointestinal: Negative.   Genitourinary: Negative.   Musculoskeletal: Negative.   Skin: Negative.   Neurological: Negative.   Endo/Heme/Allergies: Negative.     Blood pressure 112/59, pulse 63, temperature 97.9 F (36.6 C), temperature source Oral, resp. rate 18, height '5\' 3"'  (1.6 m), weight 102.967 kg (227 lb), SpO2 98 %.Body mass index is 40.22 kg/(m^2).  General Appearance: Casual and Fairly Groomed  Eye Contact::  Good  Speech:  Clear and Coherent and Normal Rate  Volume:  Normal  Mood:  Angry, Anxious and Depressed  Affect:  Congruent, Depressed and Flat  Thought Process:  Coherent, Goal Directed and Intact  Orientation:  Full (Time, Place, and Person)  Thought Content:  Delusions  Suicidal Thoughts:  No  Homicidal Thoughts:  No  Memory:  Immediate;   Good Recent;   Good Remote;   Good  Judgement:  Poor  Insight:  Fair  Psychomotor Activity:  Psychomotor Retardation  Concentration:  Good  Recall:  NA  Fund of Knowledge:Poor  Language: Good  Akathisia:   No  Handed:  Right  AIMS (if indicated):     Assets:  Desire for Improvement  ADL's:  Intact  Cognition: WNL  Sleep:      Treatment Plan Summary: Daily contact with patient to assess and evaluate symptoms and progress in treatment and Medication management  Disposition: Accepted for admission and we will be seeking placement at any facility with available  And  We have resumed his home medications.   Delfin Gant, NP   PMHNP-BC 11/06/2015 11:50 AM Patient seen face-to-face for psychiatric evaluation, chart reviewed and case discussed with the physician extender and developed treatment plan. Reviewed the information documented and agree with the treatment plan. Corena Pilgrim, MD

## 2015-11-06 NOTE — Tx Team (Signed)
Initial Interdisciplinary Treatment Plan   PATIENT STRESSORS: Marital or family conflict Medication change or noncompliance "hearing and seeing things"   PATIENT STRENGTHS: General fund of knowledge Motivation for treatment/growth Physical Health Supportive family/friends   PROBLEM LIST: Problem List/Patient Goals Date to be addressed Date deferred Reason deferred Estimated date of resolution  Psychosis 11/06/2015  11/06/2015   D/C  Depression 11/06/2015  11/06/2015   D/C  "I'm seeing things and hearing things" 11/06/2015  11/06/2015   D/C  "I'm not human" 11/06/2015  11/06/2015   D/C                                 DISCHARGE CRITERIA:  Ability to meet basic life and health needs Adequate post-discharge living arrangements Improved stabilization in mood, thinking, and/or behavior Motivation to continue treatment in a less acute level of care Need for constant or close observation no longer present Verbal commitment to aftercare and medication compliance  PRELIMINARY DISCHARGE PLAN: Outpatient therapy Return to previous living arrangement  PATIENT/FAMIILY INVOLVEMENT: This treatment plan has been presented to and reviewed with the patient, Justin Chaney.  The patient and family have been given the opportunity to ask questions and make suggestions.  [name] 11/06/2015, 8:03 PM

## 2015-11-06 NOTE — Progress Notes (Signed)
Pt confirms with ED CM he has not obtained a pcp or counselor/psychiatrist Cm provided pt with a 5 page list of [facility] providers within radius of his zip code  When pt asked if he has a family doctor or pcp, he stated " I don't have a family"  Cm explained the importance of a pcp or doctor for him to get f/u care from.

## 2015-11-06 NOTE — BH Assessment (Addendum)
Tele Assessment Note   Justin Chaney is an 20 y.o. male presenting to [facility] reporting visual and auditory hallucinations. Pt stated "I am seeing and hearing things". "I believe the world will bow down to me because I believe that I am God the flesh". "I know that I am not human". "My dreams are weird, like demonic". "I am trying to get involuntarily committed and sent off to some place for long term; that's what I need".  Pt is reporting auditory and visual hallucinations and shared that the things he is seeing are unexplainable and not of this earth. Pt also stated the can hear the screams of others as well as whispers in different languages. Pt denies SI and HI at this time. Pt reported that he has attempted suicide multiple times in the past. Pt did not report any current mental health treatment but shared that he was recently discharged from [facility] earlier today. Pt has had multiple inpatient hospitalizations and stated "I don't want to go back to [facility] because I owe them money". Pt is reporting some depressive symptoms and shared that his sleep is poor; however he did not report an issue with his appetite. Pt did not report any recent substance use; however he has tested positive for cocaine and amphetamine in the past. Pt did not report any physical, sexual or emotional abuse at this time.  Inpatient treatment is recommended.   Diagnosis: Schizoaffective disorder, bipolar type   Past Medical History:  Past Medical History  Diagnosis Date  . Asthma   . Seizures ([place])   . Brain ventricular shunt obstruction     hydrochelpis  . Depression   . Bipolar disorder [facility])     Past Surgical History  Procedure Laterality Date  . Tonsillectomy    . Ventriculo-peritoneal shunt placement / laparoscopic insertion peritoneal catheter      Family History:  Family History  Problem Relation Age of Onset  . Hypertension Mother   . Mental illness Neg Hx     Social History:   reports that he quit smoking about 2 weeks ago. His smoking use included Cigarettes. He smoked 1.00 pack per day. He does not have any smokeless tobacco history on file. He reports that he drinks about 7.2 oz of alcohol per week. He reports that he uses illicit drugs (Marijuana, Other-see comments, and Amphetamines).  Additional Social History:  Alcohol / Drug Use History of alcohol / drug use?: Yes Substance #1 Name of Substance 1: Cocaine  1 - Age of First Use: unknown  1 - Amount (size/oz): unknown  1 - Frequency: unknown  1 - Duration: unknown  1 - Last Use / Amount: unknown  Substance #2 Name of Substance 2: Amphetaminess 2 - Age of First Use: 16 2 - Amount (size/oz): "I don't know" 2 - Frequency: "I don't know" 2 - Duration: unknown  2 - Last Use / Amount: "It's been a while"  CIWA: CIWA-Ar BP: 131/82 mmHg Pulse Rate: 93 COWS:    PATIENT STRENGTHS: (choose at least two) Average or above average intelligence Communication skills  Allergies:  Allergies  Allergen Reactions  . Amoxicillin Hives  . Penicillins Hives and Rash    Has patient had a PCN reaction causing immediate rash, facial/tongue/throat swelling, SOB or lightheadedness with hypotension: unknown Has patient had a PCN reaction causing severe rash involving mucus membranes or skin necrosis: Yes Has patient had a PCN reaction that required hospitalization :unknown Has patient had a PCN reaction  occurring within the last 10 years: unknown If all of the above answers are "NO", then may proceed with Cephalosporin use.     Home Medications:  (Not in a hospital admission)  OB/GYN Status:  No LMP for male patient.  General Assessment Data Location of Assessment: [facility] TTS Assessment: In system Is this a Tele or Face-to-Face Assessment?: Face-to-Face Is this an Initial Assessment or a Re-assessment for this encounter?: Initial Assessment Marital status: Single Living Arrangements: Non-relatives/Friends Can  pt return to current living arrangement?: Yes Admission Status: Voluntary Is patient capable of signing voluntary admission?: Yes Referral Source: Self/Family/Friend Insurance type: [place] Living Arrangements: Non-relatives/Friends Name of Psychiatrist: None Reported Name of Therapist: None Reported  Education Status Is patient currently in school?: No Current Grade: N/A Highest grade of school patient has completed: [employer] Name of school: N/A Contact person: N/A  Risk to self with the past 6 months Suicidal Ideation: No Has patient been a risk to self within the past 6 months prior to admission? : Yes Suicidal Intent: No Has patient had any suicidal intent within the past 6 months prior to admission? : Yes Is patient at risk for suicide?: No Suicidal Plan?: No Has patient had any suicidal plan within the past 6 months prior to admission? : Yes Specify Current Suicidal Plan: Pt denies having a plan.  Access to Means: No Specify Access to Suicidal Means: N/A What has been your use of drugs/alcohol within the last 12 months?: Amphetamine and cocaine use reported.  Previous Attempts/Gestures: Yes How many times?: 7 Other Self Harm Risks: Pt denies  Triggers for Past Attempts: Unpredictable Intentional Self Injurious Behavior: None Family Suicide History: Unknown Recent stressful life event(s): Conflict (Comment) Persecutory voices/beliefs?: Yes Depression: Yes Depression Symptoms: Isolating, Fatigue, Loss of interest in usual pleasures, Feeling worthless/self pity Substance abuse history and/or treatment for substance abuse?: Yes Suicide prevention information given to non-admitted patients: Yes  Risk to Others within the past 6 months Homicidal Ideation: No Does patient have any lifetime risk of violence toward others beyond the six months prior to admission? : No Thoughts of Harm to Others: No Comment - Thoughts of Harm to Others: Pt denies  Current  Homicidal Intent: No Current Homicidal Plan: No Describe Current Homicidal Plan: N/A Access to Homicidal Means: No Describe Access to Homicidal Means: Pt denies Identified Victim: Pt denies History of harm to others?: No Assessment of Violence: None Noted Violent Behavior Description: No violent behaviors observed.  Does patient have access to weapons?: No Criminal Charges Pending?: No Does patient have a court date: No Is patient on probation?: No  Psychosis Hallucinations: Auditory, Visual Delusions: Grandiose  Mental Status Report Appearance/Hygiene: Disheveled, In scrubs Eye Contact: Good Motor Activity: Freedom of movement Speech: Logical/coherent Level of Consciousness: Quiet/awake Mood: Pleasant Affect: Appropriate to circumstance Anxiety Level: Minimal Thought Processes: Coherent Judgement: Partial Orientation: Person, Place, Time, Situation Obsessive Compulsive Thoughts/Behaviors: None  Cognitive Functioning Concentration: Decreased Memory: Recent Intact, Remote Intact IQ: Average Insight: Fair Impulse Control: Fair Appetite: Fair Weight Loss: 0 Weight Gain: 0 Sleep: Decreased Total Hours of Sleep: 3 Vegetative Symptoms: None  ADLScreening [facility] Assessment Services) Patient's cognitive ability adequate to safely complete daily activities?: Yes Patient able to express need for assistance with ADLs?: Yes Independently performs ADLs?: Yes (appropriate for developmental age)  Prior Inpatient Therapy Prior Inpatient Therapy: Yes Prior Therapy Dates: 2017, 2016, 2015 Prior Therapy Facilty/Provider(s): [facility], [place], [place], [facility]  Reason for  Treatment: Bipolar, Depression and Substance Abuse   Prior Outpatient Therapy Prior Outpatient Therapy: No Prior Therapy Dates: None Reported Prior Therapy Facilty/Provider(s): None Reported Reason for Treatment: NA Does patient have an ACCT team?: No Does patient have Intensive In-House Services?  : No Does patient  have [facility] services? : No Does patient have P4CC services?: No  ADL Screening (condition at time of admission) Patient's cognitive ability adequate to safely complete daily activities?: Yes Is the patient deaf or have difficulty hearing?: No Does the patient have difficulty seeing, even when wearing glasses/contacts?: No Does the patient have difficulty concentrating, remembering, or making decisions?: No Patient able to express need for assistance with ADLs?: Yes Does the patient have difficulty dressing or bathing?: No Independently performs ADLs?: Yes (appropriate for developmental age)       Abuse/Neglect Assessment (Assessment to be complete while patient is alone) Physical Abuse: Denies Verbal Abuse: Denies Sexual Abuse: Denies Exploitation of patient/patient's resources: Denies Self-Neglect: Denies     Regulatory affairs officer (For Healthcare) Does patient have an advance directive?: No Would patient like information on creating an advanced directive?: No - patient declined information    Additional Information 1:1 In Past 12 Months?: No CIRT Risk: No Elopement Risk: No Does patient have medical clearance?: Yes     Disposition:  Disposition Initial Assessment Completed for this Encounter: Yes Disposition of Patient: Inpatient treatment program Type of inpatient treatment program: Adult  Shaughnessy Gethers S 11/06/2015 12:34 AM

## 2015-11-06 NOTE — Progress Notes (Signed)
Entered in d/c instructions  Please use the resources provided to you in emergency room by case manager to assist with doctor for follow up Please verify any provider recommended to you is in network Please go to [email or web address], locate find a doctor area in top right corner of page to use to find in network primary care provider and specialists or call the toll free number on the back of your insurance card to speak with a customer service staff

## 2015-11-06 NOTE — ED Notes (Signed)
Pt guarded on approach. This nurse rounding with psychiatry -Reginold Agent, NP and Dr. Loni Muse. Pt presents Religiously preoccupied, delusional. Pt reporting he is "not human", Reporting he is "angel/demon","God's first son." Pt endorsing auditory hallucinations. Will continue to monitor on special checks q 15 mins for safety.

## 2015-11-06 NOTE — Progress Notes (Signed)
Pt denied any SI/HI/AVH. Pt was isolative and guarded. Pt declined a snack. Pt refused two of writer's attempt to administer his medications. "I'll get my medications later".  A: Continued support and availability as needed was extended to this pt. Staff continues to monitor pt with q56min checks.  R: Pt unwilling to participate. Pt remains safe at this time.

## 2015-11-06 NOTE — Progress Notes (Signed)
Admission Note:  D- 20 yr old male who presents voluntary, in no acute distress, for the treatment of psychosis.  Patient is in a calm mood with a flat affect.  Patient cooperative with admission process.  Patient reports that his friends brought him to the hospital because they believe he was acting "weird".  During admission, patient states "I am an [employer]. Not human". Patient also states "I know I am God's first son [name], the antichrist. I believe I'm not human and I'm seeing things and hearing things". Patient told writer to read statement back to him and stated "I want to make sure you write down what I say word for word".  Patient reports auditory and visual hallucinations.  When asked to elaborate on the hallucinations patient responded "I see things people cannot explain and I hear things people cannot comprehend".  Patient refused to provide any additional information regarding the hallucinations.  Patient reports multiple inpatient admissions. Patient reports "multiple" suicide attempts.  Patient denies SI/HI at this time.  Patient is able to contract for safety.  Past medical hx include VP shunt.  Patient denies current tobacco, alcohol, and drug use. Patient reports hx of amphetamine and cocaine use with last use being a month ago.  Patient denies hx of any form of abuse.  Patient is unable to identify a support system and reports that he currently lives with friends.   A- Skin was assessed and found to be clear of any abnormal marks apart from a scar left arm. Patient reports scar came from broken glass from when he punched through a window "a long time ago".   Patient searched and no contraband found, POC and unit policies explained and understanding verbalized. Consents obtained. Food and fluids offered and accepted.  R- Patient had no additional questions or concerns.

## 2015-11-06 NOTE — ED Notes (Signed)
Pt transferred to [facility] Unit per MD order. [facility] at facility to transport pt. Pt signed for personal belongings, personal belongings given to transport service for transfer. Pt signed e-signature. Pt ambulatory off unit.

## 2015-11-06 NOTE — BH Assessment (Signed)
[place] Assessment Progress Note  Per Corena Pilgrim, MD, this pt requires psychiatric hospitalization at this time.  Brooke McNichol, RN, [facility] has assigned pt to [facility] Rm [id number].  Pt has signed Voluntary Admission and Consent for Treatment, as well as Consent to Release Information to [facility], and a notification call has been placed.  Signed forms have been faxed to [facility].  Pt's nurse, Caryl Pina, has been notified, and agrees to send original paperwork along with pt via [employer], and to call report to [phone].  Jalene Mullet, [place] Triage Specialist [phone]

## 2015-11-07 ENCOUNTER — Encounter (HOSPITAL_COMMUNITY): Payer: Self-pay | Admitting: Psychiatry

## 2015-11-07 DIAGNOSIS — F6381 Intermittent explosive disorder: Secondary | ICD-10-CM

## 2015-11-07 DIAGNOSIS — F1420 Cocaine dependence, uncomplicated: Secondary | ICD-10-CM | POA: Diagnosis present

## 2015-11-07 DIAGNOSIS — F1520 Other stimulant dependence, uncomplicated: Secondary | ICD-10-CM | POA: Diagnosis present

## 2015-11-07 DIAGNOSIS — F1221 Cannabis dependence, in remission: Secondary | ICD-10-CM | POA: Clinically undetermined

## 2015-11-07 DIAGNOSIS — F1290 Cannabis use, unspecified, uncomplicated: Secondary | ICD-10-CM

## 2015-11-07 MED ORDER — FLUPHENAZINE HCL 5 MG PO TABS
5.0000 mg | ORAL_TABLET | Freq: Two times a day (BID) | ORAL | Status: DC
  Administered 2015-11-07 – 2015-11-11 (×7): 5 mg via ORAL
  Filled 2015-11-07 (×11): qty 1

## 2015-11-07 MED ORDER — OLANZAPINE 10 MG PO TBDP: 10.0000 mg | ORAL_TABLET | Freq: Three times a day (TID) | ORAL | Status: DC | PRN

## 2015-11-07 MED ORDER — OLANZAPINE 10 MG IM SOLR: 10.0000 mg | Freq: Three times a day (TID) | INTRAMUSCULAR | Status: DC | PRN

## 2015-11-07 MED ORDER — BENZTROPINE MESYLATE 0.5 MG PO TABS
0.5000 mg | ORAL_TABLET | Freq: Two times a day (BID) | ORAL | Status: DC
  Administered 2015-11-07 – 2015-11-11 (×7): 0.5 mg via ORAL
  Filled 2015-11-07 (×11): qty 1

## 2015-11-07 MED ORDER — HYDROXYZINE HCL 25 MG PO TABS
25.0000 mg | ORAL_TABLET | Freq: Four times a day (QID) | ORAL | Status: DC | PRN
  Filled 2015-11-07: qty 10

## 2015-11-07 NOTE — H&P (Signed)
Psychiatric Admission Assessment Adult  Patient Identification: Justin Chaney MRN:  [id number] Date of Evaluation:  11/07/2015 Chief Complaint: Pt states " I am seeing and hearing things that are out of this world. Can I go to [facility] ? "  Principal Diagnosis: Schizoaffective disorder, bipolar type ([place]) Diagnosis:   Patient Active Problem List   Diagnosis Date Noted  . Cocaine use disorder, severe, dependence ([place]) [F14.20] 11/07/2015  . Amphetamine use disorder, severe, dependence ([place]) [F15.20] 11/07/2015  . Cannabis use disorder, moderate, in sustained remission [F12.90] 11/07/2015  . Intermittent explosive disorder [F63.81] 09/05/2015  . Schizoaffective disorder, bipolar type ([place]) [F25.0] 09/04/2015  . Non compliance w medication regimen [Z91.14] 01/17/2015    History of Present Illness:: Justin Chaney is a 20 yr old AA male who is single , employed -does odd jobs , lives with friends in [place] , has a hx of schizoaffective do as well as polysubstance abuse, presented to [facility]  With psychosis.  Per initial notes in EHR " Pt was discharged from [facility] on 11/05/15 . He went to [employer] and people were concerned and called 911 since he was acting bizarre. Pt stated "I am seeing and hearing things". "I believe the world will bow down to me because I believe that I am God the flesh". "I know that I am not human". "My dreams are weird, like demonic". "I am trying to get involuntarily committed and sent off to some place for long term; that's what I need".  Patient seen and chart reviewed today. Discussed patient with treatment team. Pt today is seen as psychotic. Pt reports that he was discharged from [place] and he continued to see and hear things. Pt reports that he feels that he is not human and that he is the son of God- [name]. Pt also reports that he has been seeing and hearing things that he cannot comprehend and he has been communicating to them back in a language that he cannot  understand.Pt reports that he needs long term care and that he needs to be transferred to [facility].  Pt reports mood swings - but states he does not know how to explain them. Pt also reports anxiety sx on and off , when he cannot stop talking and paces.   Pt denies SI/HI at this time.  Pt has been tried on Abilify, seroquel, risperidone , zyprexa and most recently [place] . Pt reports that he does not feel any of these medications were effective. However per review of EHR there is a hx of being noncompliant on medications and frequent re- hospitalizations.  Pt reports a hx of substance abuse. Pt attempted to minimize it by saying " I stopped using them. " Pt however when asked to elaborate reported that he has been abusing cocaine since the age of 20 y. He usually uses a lot - and his use got worse this year - 2017. But he stopped a month ago. " Pt also reports abusing amphetamines - used it everyday from 20 y to 15  y and reports his last use was a few weeks ago. Pt reports hx of abusing cannabis in highschool. Pt also reports abusing Molly once and Mushrooms once in his life time.  Pt currently does not have an out patient provider at this time.    Associated Signs/Symptoms: Depression Symptoms:  anxiety, (Hypo) Manic Symptoms:  Delusions, Grandiosity, Impulsivity, Anxiety Symptoms:  on and off - see above Psychotic Symptoms:  Delusions, Hallucinations: Auditory Visual PTSD Symptoms:  Negative Total Time spent with patient: 45 minutes  Past Psychiatric History: Schizoaffective Disorder , has has multiple hospitalizations - atleast 20 . Pt was admitted at [facility], [place]. Pt was scheduled to follow up with Neuropsychiatric care - but he did not .  Is the patient at risk to self? Yes.   since he is delusional Has the patient been a risk to self in the past 6 months? Yes.    Has the patient been a risk to self within the distant past? Yes.    Is the patient a  risk to others? Yes.   since he is delusional Has the patient been a risk to others in the past 6 months? Yes.    Has the patient been a risk to others within the distant past? Yes.     Prior Inpatient Therapy:  see above Prior Outpatient Therapy:  see above  Alcohol Screening: 1. How often do you have a drink containing alcohol?: Never 9. Have you or someone else been injured as a result of your drinking?: No 10. Has a relative or friend or a doctor or another health worker been concerned about your drinking or suggested you cut down?: No Alcohol Use Disorder Identification Test Final Score (AUDIT): 0 Brief Intervention: AUDIT score less than 7 or less-screening does not suggest unhealthy drinking-brief intervention not indicated Substance Abuse History in the last 12 months:  Yes.  cocaine - since 20 y old- heavy abuse, last use a month ago, amphetamines since 20 y old - daily use - last use few weeks ago, abused cananbis in highschool, abused molly once and mushroom once Consequences of Substance Abuse: Family Consequences:  Family discord Previous Psychotropic Medications: Yes - abiliy, risperidone, seroquel, zyprexa, latuda Psychological Evaluations: No  Past Medical History:  Past Medical History  Diagnosis Date  . Asthma   . Seizures ([place])   . Brain ventricular shunt obstruction     hydrochelpis  . Depression   . Bipolar disorder [facility])     Past Surgical History  Procedure Laterality Date  . Tonsillectomy    . Ventriculo-peritoneal shunt placement / laparoscopic insertion peritoneal catheter     Family History:  Family History  Problem Relation Age of Onset  . Hypertension Mother   . Mental illness Neg Hx    Family Psychiatric  History: pt reports he does not know anything about most of his family Tobacco Screening:denies Social History: is single , lives with friends in [place] , is estranged from family - reports that he has mother and siblings , but he does not get along  with her , employed doing day labor , denies legal issues pending. History  Alcohol Use  . 7.2 oz/week  . 12 Cans of beer per week    Comment: states he no longer drinks      History  Drug Use  . Yes  . Special: Marijuana, Other-see comments, Amphetamines    Comment: Adderall, "I don't do weed, only rarely"    Additional Social History:  Allergies:   Allergies  Allergen Reactions  . Amoxicillin Hives  . Penicillins Hives and Rash    Has patient had a PCN reaction causing immediate rash, facial/tongue/throat swelling, SOB or lightheadedness with hypotension: unknown Has patient had a PCN reaction causing severe rash involving mucus membranes or skin necrosis: Yes Has patient had a PCN reaction that required hospitalization :unknown Has patient had a PCN reaction occurring within the last 10 years: unknown If  all of the above answers are "NO", then may proceed with Cephalosporin use.    Lab Results:  Results for orders placed or performed during the hospital encounter of 11/05/15 (from the past 48 hour(s))  Urine rapid drug screen (hosp performed) (Not at [facility])     Status: None   Collection Time: 11/05/15  8:17 PM  Result Value Ref Range   Opiates NONE DETECTED NONE DETECTED   Cocaine NONE DETECTED NONE DETECTED   Benzodiazepines NONE DETECTED NONE DETECTED   Amphetamines NONE DETECTED NONE DETECTED   Tetrahydrocannabinol NONE DETECTED NONE DETECTED   Barbiturates NONE DETECTED NONE DETECTED    Comment:        DRUG SCREEN FOR MEDICAL PURPOSES ONLY.  IF CONFIRMATION IS NEEDED FOR ANY PURPOSE, NOTIFY LAB WITHIN 5 DAYS.        LOWEST DETECTABLE LIMITS FOR URINE DRUG SCREEN Drug Class       Cutoff (ng/mL) Amphetamine      1000 Barbiturate      200 Benzodiazepine   263 Tricyclics       785 Opiates          300 Cocaine          300 THC              50   Comprehensive metabolic panel     Status: Abnormal   Collection Time: 11/05/15  8:36 PM  Result Value Ref Range    Sodium 139 135 - 145 mmol/L   Potassium 3.4 (L) 3.5 - 5.1 mmol/L   Chloride 106 101 - 111 mmol/L   CO2 23 22 - 32 mmol/L   Glucose, Bld 107 (H) 65 - 99 mg/dL   BUN 11 6 - 20 mg/dL   Creatinine, Ser 0.90 0.61 - 1.24 mg/dL   Calcium 9.5 8.9 - 10.3 mg/dL   Total Protein 7.8 6.5 - 8.1 g/dL   Albumin 4.7 3.5 - 5.0 g/dL   AST 26 15 - 41 U/L   ALT 41 17 - 63 U/L   Alkaline Phosphatase 82 38 - 126 U/L   Total Bilirubin 0.8 0.3 - 1.2 mg/dL   GFR calc non Af Amer >60 >60 mL/min   GFR calc Af Amer >60 >60 mL/min    Comment: (NOTE) The eGFR has been calculated using the CKD EPI equation. This calculation has not been validated in all clinical situations. eGFR's persistently <60 mL/min signify possible Chronic Kidney Disease.    Anion gap 10 5 - 15  CBC     Status: None   Collection Time: 11/05/15  8:36 PM  Result Value Ref Range   WBC 10.4 4.0 - 10.5 K/uL   RBC 5.66 4.22 - 5.81 MIL/uL   Hemoglobin 16.0 13.0 - 17.0 g/dL   HCT 46.4 39.0 - 52.0 %   MCV 82.0 78.0 - 100.0 fL   MCH 28.3 26.0 - 34.0 pg   MCHC 34.5 30.0 - 36.0 g/dL   RDW 12.0 11.5 - 15.5 %   Platelets 243 150 - 400 K/uL  Ethanol (ETOH)     Status: None   Collection Time: 11/05/15  8:37 PM  Result Value Ref Range   Alcohol, Ethyl (B) <5 <5 mg/dL    Comment:        LOWEST DETECTABLE LIMIT FOR SERUM ALCOHOL IS 5 mg/dL FOR MEDICAL PURPOSES ONLY   Salicylate level     Status: None   Collection Time: 11/05/15  8:37 PM  Result Value Ref Range  Salicylate Lvl <0.2 2.8 - 30.0 mg/dL  Acetaminophen level     Status: Abnormal   Collection Time: 11/05/15  8:37 PM  Result Value Ref Range   Acetaminophen (Tylenol), Serum <10 (L) 10 - 30 ug/mL    Comment:        THERAPEUTIC CONCENTRATIONS VARY SIGNIFICANTLY. A RANGE OF 10-30 ug/mL MAY BE AN EFFECTIVE CONCENTRATION FOR MANY PATIENTS. HOWEVER, SOME ARE BEST TREATED AT CONCENTRATIONS OUTSIDE THIS RANGE. ACETAMINOPHEN CONCENTRATIONS >150 ug/mL AT 4 HOURS AFTER INGESTION AND  >50 ug/mL AT 12 HOURS AFTER INGESTION ARE OFTEN ASSOCIATED WITH TOXIC REACTIONS.   Lithium level     Status: Abnormal   Collection Time: 11/05/15  8:37 PM  Result Value Ref Range   Lithium Lvl 0.27 (L) 0.60 - 1.20 mmol/L    Blood Alcohol level:  Lab Results  Component Value Date   ETH <5 11/05/2015   ETH <5 63/78/5885    Metabolic Disorder Labs:  Lab Results  Component Value Date   HGBA1C 5.0 09/09/2015   MPG 97 09/09/2015   Lab Results  Component Value Date   PROLACTIN 8.0 09/09/2015   Lab Results  Component Value Date   CHOL 133 09/09/2015   TRIG 70 09/09/2015   HDL 35* 09/09/2015   CHOLHDL 3.8 09/09/2015   VLDL 14 09/09/2015   LDLCALC 84 09/09/2015    Current Medications: Current Facility-Administered Medications  Medication Dose Route Frequency Provider Last Rate Last Dose  . acetaminophen (TYLENOL) tablet 650 mg  650 mg Oral Q6H PRN Delfin Gant, NP      . alum & mag hydroxide-simeth (MAALOX/MYLANTA) 200-200-20 MG/5ML suspension 30 mL  30 mL Oral Q4H PRN Delfin Gant, NP      . hydrOXYzine (ATARAX/VISTARIL) tablet 25 mg  25 mg Oral TID PRN Delfin Gant, NP      . lithium carbonate (ESKALITH) CR tablet 450 mg  450 mg Oral BID PC Delfin Gant, NP   450 mg at 11/07/15 0749  . magnesium hydroxide (MILK OF MAGNESIA) suspension 30 mL  30 mL Oral Daily PRN Delfin Gant, NP      . nicotine (NICODERM CQ - dosed in mg/24 hours) patch 21 mg  21 mg Transdermal Daily Delfin Gant, NP   21 mg at 11/07/15 0747  . ondansetron (ZOFRAN) tablet 4 mg  4 mg Oral Q8H PRN Delfin Gant, NP      . traZODone (DESYREL) tablet 50 mg  50 mg Oral QHS Delfin Gant, NP   50 mg at 11/07/15 0005   PTA Medications: Prescriptions prior to admission  Medication Sig Dispense Refill Last Dose  . lithium carbonate 300 MG capsule Take 300 mg by mouth daily.  0 11/05/2015 at Unknown time  . lithium carbonate 600 MG capsule Take 1 capsule (600 mg total)  by mouth 2 (two) times daily with a meal. For mood stabilization (Patient taking differently: Take 600 mg by mouth at bedtime. For mood stabilization) 60 capsule 0 11/04/2015 at Unknown time  . lurasidone (LATUDA) 40 MG TABS tablet Take 80 mg by mouth daily with breakfast.   11/04/2015 at Unknown time  . nicotine (NICODERM CQ - DOSED IN MG/24 HOURS) 21 mg/24hr patch Place 1 patch (21 mg total) onto the skin daily. For smoking cessation (Patient not taking: Reported on 11/05/2015) 28 patch 0 Not Taking at Unknown time    Musculoskeletal: Strength & Muscle Tone: within normal limits Gait & Station: normal Patient leans:  N/A  Psychiatric Specialty Exam: Physical Exam  Nursing note and vitals reviewed. Constitutional: He is oriented to person, place, and time. He appears well-developed and well-nourished.  HENT:  Head: Normocephalic and atraumatic.  Eyes: Conjunctivae and EOM are normal.  Neck: Normal range of motion. Neck supple. No thyromegaly present.  Cardiovascular: Normal rate.   Respiratory: Effort normal and breath sounds normal.  GI: Soft.  Musculoskeletal: Normal range of motion.  Neurological: He is alert and oriented to person, place, and time.  Skin: Skin is warm and dry.  Psychiatric: His speech is normal. His mood appears anxious. He is actively hallucinating. Thought content is delusional. Cognition and memory are normal. He expresses impulsivity.    Review of Systems  Unable to perform ROS: other  Psychiatric/Behavioral: Positive for depression and substance abuse.       Unable to assess at this time.  Patient refusing to talk  All other systems reviewed and are negative.   Blood pressure 113/77, pulse 86, temperature 97.8 F (36.6 C), temperature source Oral, resp. rate 18, height '5\' 3"'  (1.6 m), weight 102.96 kg (226 lb 15.8 oz), SpO2 99 %.Body mass index is 40.22 kg/(m^2).  General Appearance: Guarded  Eye Contact::  Fair  Speech:  Normal Rate  Volume:  Normal  Mood:   Anxious  Affect:  Congruent  Thought Process:  Goal Directed  Orientation:  Full (Time, Place, and Person)  Thought Content:  Delusions, Hallucinations: Auditory Visual and Rumination  Suicidal Thoughts:  No  Homicidal Thoughts:  No  Memory:  Immediate;   Fair Recent;   Fair Remote;   Fair  Judgement:  Impaired  Insight:  Lacking and Shallow  Psychomotor Activity:  Normal  Concentration:  Fair  Recall:  [employer] of Knowledge:Fair  Language: Good  Akathisia:  No  Handed:  Right  AIMS (if indicated):     Assets:  Others:  access to health care  ADL's:  Intact  Cognition: WNL  Sleep:  Number of Hours: 5.25 hrs     Treatment Plan Summary:Justin Chaney is a 20 yr old AA male who is single , employed -does odd jobs , lives with friends in [place] , has a hx of schizoaffective do as well as polysubstance abuse, presented to [facility]  With psychosis. Pt will need inpatient stabilization. Daily contact with patient to assess and evaluate symptoms and progress in treatment and Medication management   Patient will benefit from inpatient treatment and stabilization.  Estimated length of stay is 5-7 days.  Reviewed past medical records,treatment plan.  Will start a trial of Prolixin 5 mg po bid  for psychosis.  Will add Cogentin 0.5 mg po bid  for EPS. Will continue Lithium CR 450 mg po bid for mood swings. Li level - 0.27 ( 11/05/15) - will get another level in 5 days since there is a question about compliance. Will continue Trazodone 50 mg po qhs for sleep.  Will continue to monitor vitals ,medication compliance and treatment side effects while patient is here.  Will monitor for medical issues as well as call consult as needed.  Reviewed labs cbc - wnl, bmp - shows K+ - 3.4 , tsh - wnl, lipid panel, hba1c, PL - wnl ( 09/06/15) , will get EKG -for qtc.  CSW will start working on disposition.  Patient to participate in therapeutic milieu .       Observation Level/Precautions:  15 minute  checks     Psychotherapy:  Individual and  group sessions    Consultations:  As appropriate  Discharge Concerns:  Safety, stabilization, and access to medication         I certify that inpatient services furnished can reasonably be expected to improve the patient's condition.    Denelle Capurro, MD 4/7/201710:59 AM

## 2015-11-07 NOTE — BHH Group Notes (Signed)
[place] LCSW Group Therapy  11/07/2015  1:05 PM  Type of Therapy:  Group therapy  Participation Level:  Active  Participation Quality:  Attentive  Affect:  Flat  Cognitive:  Oriented  Insight:  Limited  Engagement in Therapy:  Limited  Modes of Intervention:  Discussion, Socialization  Summary of Progress/Problems:  Chaplain was here to lead a group on themes of hope and courage.  Pt states that he is hopeful things can one day get better for him. "Inside is telling me to stay strong." Pt identified his older brother as someone that he sees as a strong person. Pt's brother checks on him occasionally and gives a good example for pt.  Roque Lias B 11/07/2015 1:08 PM

## 2015-11-07 NOTE — Tx Team (Signed)
Interdisciplinary Treatment Plan Update (Adult)  Date:  11/07/2015 Time Reviewed:  1:00 PM  Progress in Treatment: Attending groups: Yes. Participating in groups: Yes. Taking medication as prescribed:  Yes. Tolerating medication:  Yes. Family/Significant othe contact made:  No, will contact:  no one. Pt declined collateral contact. Patient understands diagnosis:  Yes, as evidenced by seeking help with AVH and delusions. Discussing patient identified problems/goals with staff:  Yes, see initial care plan. Medical problems stabilized or resolved:  Yes Denies suicidal/homicidal ideation: Yes. Issues/concerns per patient self-inventory: No. Other:  New problem(s) identified:   Discharge Plan or Barriers:  Reason for Continuation of Hospitalization: Delusions  Hallucinations Medication stabilization  Comments: Pt is a 20 yr old AA male who is single , employed -does odd jobs , lives with friends in [place] , has a hx of schizoaffective do as well as polysubstance abuse, presented to [facility] With psychosis." Pt was discharged from [facility] hospital on 11/05/15 . He went to [employer] and people were concerned and called 911 since he was acting bizarre. Pt stated "I am seeing and hearing things". "I believe the world will bow down to me because I believe that I am God the flesh". "I know that I am not human". "My dreams are weird, like demonic". "I am trying to get involuntarily committed and sent off to some place for long term; that's what I need". Cogentin, Prolixin, Lithium, Desyrel trial  Estimated length of stay: 4-5 days  New goal(s):  Review of initial/current patient goals per problem list:  1. Goal(s): Patient will participate in aftercare plan  Met:Yes  Target date: at discharge  As evidenced by: Patient will participate within aftercare plan AEB aftercare provider and housing plan at discharge being identified.  11/07/15: Pt will return to his friend's house and follow up outpt  with [facility].  4. Goal(s): Patient will demonstrate decreased signs of psychosis.  Met: No   Target date:at discharge  As evidenced by: Patient will demonstrate decreased signs of psychosis as evidenced by a reduction in AVH, paranoia, and/or delusions.   11/07/15: Pt endorses AVH and believes that he is [name].  Grandiose  Attendees: Patient:  11/07/2015 1:00 PM  Family:   11/07/2015 1:00 PM  Physician:  Dr. Ursula Alert, MD 11/07/2015 1:00 PM  Nursing: Edd Arbour, RN   11/07/2015 1:00 PM  Case Manager:  Roque Lias, LCSW 11/07/2015 1:00 PM  Counselor:  Matthew Saras, MSW Intern 11/07/2015 1:00 PM  Other:   11/07/2015 1:00 PM  Other:   11/07/2015 1:00 PM  Other:   11/07/2015 1:00 PM  Other:  11/07/2015 1:00 PM  Other:    Other:    Other:    Other:    Other:    Other:      Scribe for Treatment Team:   Georga Kaufmann, MSW Intern 11/07/2015 1:00 PM

## 2015-11-07 NOTE — BHH Group Notes (Signed)
[facility] LCSW Aftercare Discharge Planning Group Note   11/07/2015 10:56 AM  Participation Quality:  Invited.  Chose to not attend    [place], Barbaraann Rondo B
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
## 2015-11-07 NOTE — BHH Group Notes (Signed)
Patient did not attend group.

## 2015-11-07 NOTE — Progress Notes (Signed)
Adult Psychoeducational Group Note  Date:  11/07/2015 Time:  8:57 PM  Group Topic/Focus:  Wrap-Up Group:   The focus of this group is to help patients review their daily goal of treatment and discuss progress on daily workbooks.  Participation Level:  Active  Participation Quality:  Appropriate  Affect:  Appropriate  Cognitive:  Appropriate  Insight: Good  Engagement in Group:  Engaged  Modes of Intervention:  Activity  Additional Comments:  Patient rated his day a 21. Goal was to stop isolating his self and to come to more groups.  Justin Chaney 11/07/2015, 8:57 PM

## 2015-11-07 NOTE — Progress Notes (Signed)
D-  Patient stated that he is hearing voices speaking ancient languages. Patient states that he can decipher the languages, but can not tell writer what they are saying. Patient states that he is over a thousand years old and he takes human form from time to time to come to earth and destroy souls.  Patient states that he sees unearthly beings like demons.  Patient was smiling inappropriately during conversation.  Patient denies SI.  A-  Assess patient for safety, offer medications as prescribed, engage patient in 1:1 staff talks,   R-  Continue to assess for safety

## 2015-11-07 NOTE — BHH Suicide Risk Assessment (Signed)
[facility] Admission Suicide Risk Assessment   Nursing information obtained from:  Patient Demographic factors:  Male, Adolescent or young adult, Low socioeconomic status, Unemployed Current Mental Status:  NA Loss Factors:  NA Historical Factors:  Prior suicide attempts Risk Reduction Factors:  Living with another person, especially a relative  Total Time spent with patient: 30 minutes Principal Problem: Schizoaffective disorder, bipolar type ([place]) Diagnosis:   Patient Active Problem List   Diagnosis Date Noted  . Cocaine use disorder, severe, dependence ([place]) [F14.20] 11/07/2015  . Amphetamine use disorder, severe, dependence ([place]) [F15.20] 11/07/2015  . Cannabis use disorder, moderate, in sustained remission [F12.90] 11/07/2015  . Intermittent explosive disorder [F63.81] 09/05/2015  . Schizoaffective disorder, bipolar type ([place]) [F25.0] 09/04/2015  . Non compliance w medication regimen [Z91.14] 01/17/2015   Subjective Data: Please see H&P.   Continued Clinical Symptoms:  Alcohol Use Disorder Identification Test Final Score (AUDIT): 0 The "Alcohol Use Disorders Identification Test", Guidelines for Use in Primary Care, Second Edition.  World Pharmacologist [facility]). Score between 0-7:  no or low risk or alcohol related problems. Score between 8-15:  moderate risk of alcohol related problems. Score between 16-19:  high risk of alcohol related problems. Score 20 or above:  warrants further diagnostic evaluation for alcohol dependence and treatment.   CLINICAL FACTORS:   Alcohol/Substance Abuse/Dependencies Currently Psychotic Unstable or Poor Therapeutic Relationship Previous Psychiatric Diagnoses and Treatments   Musculoskeletal: Strength & Muscle Tone: within normal limits Gait & Station: normal Patient leans: N/A  Psychiatric Specialty Exam: Review of Systems  Psychiatric/Behavioral: Positive for hallucinations and substance abuse. The patient is nervous/anxious.   All  other systems reviewed and are negative.   Blood pressure 113/77, pulse 86, temperature 97.8 F (36.6 C), temperature source Oral, resp. rate 18, height 5\' 3"  (1.6 m), weight 102.96 kg (226 lb 15.8 oz), SpO2 99 %.Body mass index is 40.22 kg/(m^2).                              Please see H&P.                           COGNITIVE FEATURES THAT CONTRIBUTE TO RISK:  Closed-mindedness, Polarized thinking and Thought constriction (tunnel vision)    SUICIDE RISK:   Moderate:  Frequent suicidal ideation with limited intensity, and duration, some specificity in terms of plans, no associated intent, good self-control, limited dysphoria/symptomatology, some risk factors present, and identifiable protective factors, including available and accessible social support.  PLAN OF CARE: Please see H&P.   I certify that inpatient services furnished can reasonably be expected to improve the patient's condition.   Jakira Mcfadden, MD 11/07/2015, 10:07 AM

## 2015-11-07 NOTE — BHH Suicide Risk Assessment (Signed)
B[facility] INPATIENT:  Family/Significant Other Suicide Prevention Education  Suicide Prevention Education:  Patient Refusal for Family/Significant Other Suicide Prevention Education: The patient Justin Chaney has refused to provide written consent for family/significant other to be provided Family/Significant Other Suicide Prevention Education during admission and/or prior to discharge.  Physician notified.  Georga Kaufmann 11/07/2015, 1:08 PM

## 2015-11-08 DIAGNOSIS — R45851 Suicidal ideations: Secondary | ICD-10-CM

## 2015-11-08 NOTE — BHH Group Notes (Signed)
[place] Group Notes: (Clinical Social Work)  11/08/2015 11:15-12:00PM  Summary of Progress/Problems: Today's process group involved patients discussing one thing they do that keeps them from living the life they want. This was initially a difficult topic for most patients to understand and was both written on the whiteboard and explained in several ways until understood. The patient expressed that he does not reach out to meet new people, and that keeps him from living the life he wants.  He listened to most other people talk, but when one patient monopolized time talking about her mother, he got up and left and did not return.  Type of Therapy: Group Therapy - Process  Participation Level: Minimal  Participation Quality: Attentive  Affect: Flat  Cognitive: Appropriate  Insight: Limited  Engagement in Therapy: Limited  Modes of Intervention: Exploration, Discussion  Selmer Dominion, LCSW 11/08/2015, 12:41 PM

## 2015-11-08 NOTE — Progress Notes (Signed)
Adult Psychoeducational Group Note  Date:  11/08/2015 Time:  9:13 PM  Group Topic/Focus:  Wrap-Up Group:   The focus of this group is to help patients review their daily goal of treatment and discuss progress on daily workbooks.  Participation Level:  Did Not Attend   Justin Chaney 11/08/2015, 9:13 PM

## 2015-11-08 NOTE — Progress Notes (Signed)
Patient ID: Justin Chaney, male   DOB: Jun 02, 1996, 20 y.o.   MRN: [id number]   D: Pt has been very agitated and irritable on the unit today. Pt stayed in the bed most of the day, he did not attend groups. Pt reported that he was just tired and wanted to be left alone, pt reported that this place was jot going to change or help him . Pt reported that he was going to hear voices for the rest of his life, nothing about that was going to change. Pt refused to do patient self inventory sheet, and also refused evening dose of Prolixin and Cogentin. Pt reported being negative SI/HI, no AH/VH noted. A: 15 min checks continued for patient safety. R: Pt safety maintained.

## 2015-11-08 NOTE — Progress Notes (Signed)
[facility] MD Progress Note  11/08/2015 1:05 PM Rondy Krupinski  MRN:  [id number] Subjective:  Patient was found in room.  When awakened we discussed the steps he was taking to work on his schizophrenia.  "I refused my prolixin this morning. Didn't want to take it. They think Im crazy because of my spiritual beliefs. That doesn't make me crazy. Once you are schizophrenic you are always schizophrenic, no medications are going to help that which why I didn't take my Prolixin."  Objective:   I have discussed case with treatment team and have met with patient. Patient is a 20 year old male, who is known to our unit from recent psychiatric admission   From 2/2 - 2/11 ans 03/11-03/17/2017. He was recently discharged from [facility] center on 11/05/15, where he then presented to a [place] and began to exhibit bizarre behavior.  He has been diagnosed with Bipolar Disorder and with Schizoaffective Disorder in the past. He responded well to Abilify/Lithium during recent admission. Patient was readmitted due to depression, suicidal ideations, and reports of hallucinations. UDS was negative.At this time presents irritable, with fair eye contact, and answering most questions with short sentences or monosyllables. Also states " I do not want to talk right now" in an attempt to shorten session. Does endorse depression. At present endorses passive SI but denies plan or intention of hurting self or of SI on unit  Denies hallucinations at this time . He is able to contract for safety.  Denies medication side effects ( has been restarted on Abilify and LiC03 ). Very low serum lithium level on admission suggests medication non compliance.  Remains to isolate in room, minimal group/milieu participation at this time .   Principal Problem: Schizoaffective disorder, bipolar type ([place]) Diagnosis:   Patient Active Problem List   Diagnosis Date Noted  . Cocaine use disorder, severe, dependence ([place]) [F14.20] 11/07/2015  .  Amphetamine use disorder, severe, dependence ([place]) [F15.20] 11/07/2015  . Cannabis use disorder, moderate, in sustained remission [F12.90] 11/07/2015  . Intermittent explosive disorder [F63.81] 09/05/2015  . Schizoaffective disorder, bipolar type ([place]) [F25.0] 09/04/2015  . Non compliance w medication regimen [Z91.14] 01/17/2015   Total Time spent with patient: 20 minutes    Past Medical History:  Past Medical History  Diagnosis Date  . Asthma   . Seizures ([place])   . Brain ventricular shunt obstruction     hydrochelpis  . Depression   . Bipolar disorder [facility])     Past Surgical History  Procedure Laterality Date  . Tonsillectomy    . Ventriculo-peritoneal shunt placement / laparoscopic insertion peritoneal catheter     Family History:  Family History  Problem Relation Age of Onset  . Hypertension Mother   . Mental illness Neg Hx     Social History:  History  Alcohol Use  . 7.2 oz/week  . 12 Cans of beer per week    Comment: states he no longer drinks      History  Drug Use  . Yes  . Special: Marijuana, Other-see comments, Amphetamines    Comment: Adderall, "I don't do weed, only rarely"    Social History   Social History  . Marital Status: Single    Spouse Name: N/A  . Number of Children: N/A  . Years of Education: N/A   Social History Main Topics  . Smoking status: Former Smoker -- 1.00 packs/day    Types: Cigarettes    Quit date: 10/22/2015  . Smokeless tobacco: None  .  Alcohol Use: 7.2 oz/week    12 Cans of beer per week     Comment: states he no longer drinks   . Drug Use: Yes    Special: Marijuana, Other-see comments, Amphetamines     Comment: Adderall, "I don't do weed, only rarely"  . Sexual Activity: Not Asked   Other Topics Concern  . None   Social History Narrative   Additional Social History:   Sleep: Good  Appetite:  Fair  Current Medications: Current Facility-Administered Medications  Medication Dose Route Frequency Provider  Last Rate Last Dose  . acetaminophen (TYLENOL) tablet 650 mg  650 mg Oral Q6H PRN Delfin Gant, NP      . alum & mag hydroxide-simeth (MAALOX/MYLANTA) 200-200-20 MG/5ML suspension 30 mL  30 mL Oral Q4H PRN Delfin Gant, NP      . benztropine (COGENTIN) tablet 0.5 mg  0.5 mg Oral BID Ursula Alert, MD   0.5 mg at 11/08/15 0817  . fluPHENAZine (PROLIXIN) tablet 5 mg  5 mg Oral BID Ursula Alert, MD   5 mg at 11/08/15 0817  . hydrOXYzine (ATARAX/VISTARIL) tablet 25 mg  25 mg Oral Q6H PRN Ursula Alert, MD      . lithium carbonate (ESKALITH) CR tablet 450 mg  450 mg Oral BID PC Delfin Gant, NP   450 mg at 11/08/15 0817  . magnesium hydroxide (MILK OF MAGNESIA) suspension 30 mL  30 mL Oral Daily PRN Delfin Gant, NP      . nicotine (NICODERM CQ - dosed in mg/24 hours) patch 21 mg  21 mg Transdermal Daily Delfin Gant, NP   21 mg at 11/07/15 0747  . OLANZapine zydis (ZYPREXA) disintegrating tablet 10 mg  10 mg Oral TID PRN Ursula Alert, MD       Or  . OLANZapine (ZYPREXA) injection 10 mg  10 mg Intramuscular TID PRN Ursula Alert, MD      . ondansetron (ZOFRAN) tablet 4 mg  4 mg Oral Q8H PRN Delfin Gant, NP      . traZODone (DESYREL) tablet 50 mg  50 mg Oral QHS Delfin Gant, NP   50 mg at 11/07/15 0005    Lab Results: No results found for this or any previous visit (from the past 65 hour(s)).  Blood Alcohol level:  Lab Results  Component Value Date   ETH <5 11/05/2015   ETH <5 10/11/2015    Physical Findings: AIMS: Facial and Oral Movements Muscles of Facial Expression: None, normal Lips and Perioral Area: None, normal Jaw: None, normal Tongue: None, normal,Extremity Movements Upper (arms, wrists, hands, fingers): None, normal Lower (legs, knees, ankles, toes): None, normal, Trunk Movements Neck, shoulders, hips: None, normal, Overall Severity Severity of abnormal movements (highest score from questions above): None,  normal Incapacitation due to abnormal movements: None, normal Patient's awareness of abnormal movements (rate only patient's report): No Awareness, Dental Status Current problems with teeth and/or dentures?: Yes (Chipped front tooth) Does patient usually wear dentures?: No  CIWA:    COWS:     Musculoskeletal: Strength & Muscle Tone: within normal limits Gait & Station: normal Patient leans: N/A  Psychiatric Specialty Exam: Review of Systems  Psychiatric/Behavioral: Positive for depression, suicidal ideas and hallucinations. Negative for memory loss and substance abuse. The patient is nervous/anxious and has insomnia.   All other systems reviewed and are negative.  denies nausea, denies vomiting   Blood pressure 120/69, pulse 85, temperature 98.2 F (36.8 C), temperature source  Oral, resp. rate 16, height _0  (1.6 m), weight 102.96 kg (226 lb 15.8 oz), SpO2 99 %.Body mass index is 40.22 kg/(m^2).  General Appearance: Fairly Groomed  Engineer, water::  Fair  Speech:  Slow  Volume:  Decreased  Mood:  Dysphoric and Irritable  Affect:  Restricted  Thought Process:  Linear  Orientation:  Other:  alert and attentive   Thought Content:  denies hallucinations, no delusions - does not appear internally preoccupied   Suicidal Thoughts:  Yes.  without intent/plan- denies any plan or intention of suicide or of hurting self on unit   Homicidal Thoughts:  No  Memory:  recent and remote grossly intact   Judgement:  Fair  Insight:  Fair  Psychomotor Activity:  Decreased  Concentration:  Good  Recall:  Good  Fund of Knowledge:Good  Language: Good  Akathisia:  Negative  Handed:  Right  AIMS (if indicated):     Assets:  Desire for Improvement Resilience  ADL's:  Impaired  Cognition: WNL  Sleep:  Number of Hours: 6.75  Assessment - patient presents irritable, dysphoric, with fair eye contact, answering only some questions, and expressing irritability  . Denies psychotic symptoms and does not  appear internally preoccupied or agitated . Denies medication side effects ( on Abilify and Lithium ) which he had responded well to during recent admission.  Treatment Plan Summary: Daily contact with patient to assess and evaluate symptoms and progress in treatment, Medication management, Plan inpatient admission and medications as below    Treatment Plan Summary:Sophie Garrido is a 20 yr old AA male who is single , employed -does odd jobs , lives with friends in [place] , has a hx of schizoaffective do as well as polysubstance abuse, presented to [facility] With psychosis. Pt will need inpatient stabilization. Daily contact with patient to assess and evaluate symptoms and progress in treatment and Medication management   Patient will benefit from inpatient treatment and stabilization.  Estimated length of stay is 5-7 days.  Reviewed past medical records,treatment plan.  Will start a trial of Prolixin 5 mg po bid for psychosis.  Will add Cogentin 0.5 mg po bid for EPS. Will continue Lithium CR 450 mg po bid for mood swings. Li level - 0.27 ( 11/05/15) - will get another level in 5 days since there is a question about compliance. Will continue Trazodone 50 mg po qhs for sleep.  Will continue to monitor vitals ,medication compliance and treatment side effects while patient is here.  Will monitor for medical issues as well as call consult as needed.  Reviewed labs cbc - wnl, bmp - shows K+ - 3.4 , tsh - wnl, lipid panel, hba1c, PL - wnl ( 09/06/15) , will get EKG -for qtc.  CSW will start working on disposition.  Patient to participate in therapeutic milieu .   Nanci Pina, FNP [facility] 11/08/2015, 1:05 PM I agree with assessment and plan Geralyn Flash A. Sabra Heck, M.D.

## 2015-11-08 NOTE — Progress Notes (Signed)
D: Pt is flat, isolative withdrawn to self even while in the dayroom. Pt endorses visual and auditory hallucinations; states, "they speak in different languages and I have to listen carefully to understand what each is saying." Pt denies any command hallucinations. Pt denies depression, anxiety, pain, SI and HI. A: Medications offered as prescribed.  Support, encouragement, and safe environment provided.  15-minute safety checks continue. R: Pt refused nighttime meds.  Pt attended group. Safety checks continue

## 2015-11-09 NOTE — Progress Notes (Signed)
Patient ID: Justin Chaney, male   DOB: February 13, 1996, 20 y.o.   MRN: [id number] D: Patient mood and affect is flat. Pt isolative to his room all evening. Pt interaction with staff very short and does not want to be bothered. Pt denies SI/HI and pain. Pt does not appear to be responding to internal stimuli.No behavioral issues noted.  A: Support and encouragement offered as needed. Pt refused sleep medication.  R: Patient is safe. Will continue to monitor patient for safety and stability.

## 2015-11-09 NOTE — Progress Notes (Signed)
D: Pt is flat, isolative and withdrawn to room; stayed in bed all evening. Pt endorses visual and auditory hallucinations; states, "I see some good things, I see some bad things; they speak in different languages, is like they can read my mind." Pt denies any command hallucinations. Pt denies depression, anxiety, pain, SI and HI. A: Medications offered as prescribed.  Support, encouragement, and safe environment provided.  15-minute safety checks continue. R: Pt refused nighttime meds.  Pt attended group. Safety checks continue

## 2015-11-09 NOTE — BHH Group Notes (Signed)
[place] Group Notes: (Clinical Social Work)   11/09/2015      Type of Therapy:  Group Therapy   Participation Level:  Did Not Attend despite MHT prompting - Pt did not come to group until the last 10 minutes, then said he really "could dig" the music.   Selmer Dominion, LCSW 11/09/2015, 1:51 PM

## 2015-11-09 NOTE — Progress Notes (Signed)
[facility] MD Progress Note  11/09/2015 1:52 PM Justin Chaney  MRN:  [id number] Subjective:  Patient was found in room.  When awakened we discussed the steps he was taking to work on his schizophrenia.  "Didnt I talk to you yesterday..so why do we have to talk again. Im always seeing stuff. Nothing is going to help take away natural things. Earlie Server just trying to get me out of here. I took my meds this morning, and my Social worker said she was going to put in a referral to [facility]. "  Objective:   I have discussed case with treatment team and have met with patient. Patient is a 20 year old male, who is known to our unit from recent psychiatric admission   From 2/2 - 2/11 ans 03/11-03/17/2017. He was recently discharged from [facility] center on 11/05/15, where he then presented to a [place] and began to exhibit bizarre behavior.  He has been diagnosed with Bipolar Disorder and with Schizoaffective Disorder in the past. He responded well to Abilify/Lithium during recent admission.  Patient was readmitted due to depression, suicidal ideations, and reports of hallucinations. UDS was negative.At this time presents irritable, with fair eye contact, and answering most questions with short sentences or monosyllables. Does endorse depression. At present endorses passive SI but denies plan or intention of hurting self or of SI on unit  Reports active hallucinations at this time . He is able to contract for safety. Denies medication side effects ( has been restarted on Abilify and LiC03 ). Remains to isolate in room, minimal group/milieu participation at this time . He cites sleeping well, however he eats poorly. "I think it is the medicine that is not making  Me eat."    Principal Problem: Schizoaffective disorder, bipolar type ([place]) Diagnosis:   Patient Active Problem List   Diagnosis Date Noted  . Cocaine use disorder, severe, dependence ([place]) [F14.20] 11/07/2015  . Amphetamine use disorder, severe,  dependence ([place]) [F15.20] 11/07/2015  . Cannabis use disorder, moderate, in sustained remission [F12.90] 11/07/2015  . Intermittent explosive disorder [F63.81] 09/05/2015  . Schizoaffective disorder, bipolar type ([place]) [F25.0] 09/04/2015  . Non compliance w medication regimen [Z91.14] 01/17/2015   Total Time spent with patient: 20 minutes  Past Medical History:  Past Medical History  Diagnosis Date  . Asthma   . Seizures ([place])   . Brain ventricular shunt obstruction     hydrochelpis  . Depression   . Bipolar disorder [facility])     Past Surgical History  Procedure Laterality Date  . Tonsillectomy    . Ventriculo-peritoneal shunt placement / laparoscopic insertion peritoneal catheter     Family History:  Family History  Problem Relation Age of Onset  . Hypertension Mother   . Mental illness Neg Hx     Social History:  History  Alcohol Use  . 7.2 oz/week  . 12 Cans of beer per week    Comment: states he no longer drinks      History  Drug Use  . Yes  . Special: Marijuana, Other-see comments, Amphetamines    Comment: Adderall, "I don't do weed, only rarely"    Social History   Social History  . Marital Status: Single    Spouse Name: N/A  . Number of Children: N/A  . Years of Education: N/A   Social History Main Topics  . Smoking status: Former Smoker -- 1.00 packs/day    Types: Cigarettes    Quit date: 10/22/2015  . Smokeless tobacco:  None  . Alcohol Use: 7.2 oz/week    12 Cans of beer per week     Comment: states he no longer drinks   . Drug Use: Yes    Special: Marijuana, Other-see comments, Amphetamines     Comment: Adderall, "I don't do weed, only rarely"  . Sexual Activity: Not Asked   Other Topics Concern  . None   Social History Narrative   Additional Social History:   Sleep: Good  Appetite:  Fair  Current Medications: Current Facility-Administered Medications  Medication Dose Route Frequency Provider Last Rate Last Dose  . acetaminophen  (TYLENOL) tablet 650 mg  650 mg Oral Q6H PRN Delfin Gant, NP      . alum & mag hydroxide-simeth (MAALOX/MYLANTA) 200-200-20 MG/5ML suspension 30 mL  30 mL Oral Q4H PRN Delfin Gant, NP      . benztropine (COGENTIN) tablet 0.5 mg  0.5 mg Oral BID Ursula Alert, MD   0.5 mg at 11/09/15 1031  . fluPHENAZine (PROLIXIN) tablet 5 mg  5 mg Oral BID Ursula Alert, MD   5 mg at 11/09/15 1030  . hydrOXYzine (ATARAX/VISTARIL) tablet 25 mg  25 mg Oral Q6H PRN Ursula Alert, MD      . lithium carbonate (ESKALITH) CR tablet 450 mg  450 mg Oral BID PC Delfin Gant, NP   450 mg at 11/09/15 1031  . magnesium hydroxide (MILK OF MAGNESIA) suspension 30 mL  30 mL Oral Daily PRN Delfin Gant, NP      . nicotine (NICODERM CQ - dosed in mg/24 hours) patch 21 mg  21 mg Transdermal Daily Delfin Gant, NP   21 mg at 11/07/15 0747  . OLANZapine zydis (ZYPREXA) disintegrating tablet 10 mg  10 mg Oral TID PRN Ursula Alert, MD       Or  . OLANZapine (ZYPREXA) injection 10 mg  10 mg Intramuscular TID PRN Ursula Alert, MD      . ondansetron (ZOFRAN) tablet 4 mg  4 mg Oral Q8H PRN Delfin Gant, NP      . traZODone (DESYREL) tablet 50 mg  50 mg Oral QHS Delfin Gant, NP   50 mg at 11/07/15 0005    Lab Results: No results found for this or any previous visit (from the past 67 hour(s)).  Blood Alcohol level:  Lab Results  Component Value Date   ETH <5 11/05/2015   ETH <5 10/11/2015    Physical Findings: AIMS: Facial and Oral Movements Muscles of Facial Expression: None, normal Lips and Perioral Area: None, normal Jaw: None, normal Tongue: None, normal,Extremity Movements Upper (arms, wrists, hands, fingers): None, normal Lower (legs, knees, ankles, toes): None, normal, Trunk Movements Neck, shoulders, hips: None, normal, Overall Severity Severity of abnormal movements (highest score from questions above): None, normal Incapacitation due to abnormal movements: None,  normal Patient's awareness of abnormal movements (rate only patient's report): No Awareness, Dental Status Current problems with teeth and/or dentures?: Yes (Chipped front tooth) Does patient usually wear dentures?: No  CIWA:    COWS:     Musculoskeletal: Strength & Muscle Tone: within normal limits Gait & Station: normal Patient leans: N/A  Psychiatric Specialty Exam: Review of Systems  Psychiatric/Behavioral: Positive for depression, suicidal ideas and hallucinations. Negative for memory loss and substance abuse. The patient is nervous/anxious and has insomnia.   All other systems reviewed and are negative.  denies nausea, denies vomiting   Blood pressure 112/75, pulse 94, temperature 98.7 F (37.1  C), temperature source Oral, resp. rate 16, height '5\' 3"'  (1.6 m), weight 102.96 kg (226 lb 15.8 oz), SpO2 99 %.Body mass index is 40.22 kg/(m^2).  General Appearance: Fairly Groomed  Engineer, water::  Fair  Speech:  Slow  Volume:  Decreased  Mood:  Dysphoric and Irritable  Affect:  Blunt, Depressed, Flat and Labile  Thought Process:  Linear  Orientation:  Other:  alert and attentive   Thought Content:  Hallucinations: Auditory Visual- does not appear internally preoccupied   Suicidal Thoughts:  Yes.  without intent/plan- denies any plan or intention of suicide or of hurting self on unit   Homicidal Thoughts:  No  Memory:  recent and remote grossly intact   Judgement:  Fair  Insight:  Fair  Psychomotor Activity:  Decreased  Concentration:  Good  Recall:  Good  Fund of Knowledge:Good  Language: Good  Akathisia:  Negative  Handed:  Right  AIMS (if indicated):     Assets:  Desire for Improvement Resilience  ADL's:  Impaired  Cognition: WNL  Sleep:  Number of Hours: 6.5  Assessment - patient presents irritable, dysphoric, with fair eye contact, answering only some questions, and expressing irritability  . Denies psychotic symptoms and does not appear internally preoccupied or  agitated . Denies medication side effects ( on Abilify and Lithium ) which he had responded well to during recent admission.  Treatment Plan Summary: Daily contact with patient to assess and evaluate symptoms and progress in treatment, Medication management, Plan inpatient admission and medications as below    Treatment Plan Summary:Gillermo Vandermeulen is a 20 yr old AA male who is single , employed -does odd jobs , lives with friends in [place] , has a hx of schizoaffective do as well as polysubstance abuse, presented to [facility] With psychosis. Pt will need inpatient stabilization and long term placement due to multiplehospital readmissions.  Daily contact with patient to assess and evaluate symptoms and progress in treatment and Medication management   Patient will benefit from inpatient treatment and stabilization.  Estimated length of stay is 5-7 days.  Reviewed past medical records,treatment plan.  Will start a trial of Prolixin 5 mg po bid for psychosis.  Will add Cogentin 0.5 mg po bid for EPS. Will continue Lithium CR 450 mg po bid for mood swings. Li level - 0.27 ( 11/05/15) - will get another level in 5 days since there is a question about compliance. Will continue Trazodone 50 mg po qhs for sleep.  Will continue to monitor vitals ,medication compliance and treatment side effects while patient is here.  Will monitor for medical issues as well as call consult as needed.  Reviewed labs cbc - wnl, bmp - shows K+ - 3.4 , tsh - wnl, lipid panel, hba1c, PL - wnl ( 09/06/15) , will get EKG -for qtc.  CSW will start working on disposition.  Patient to participate in therapeutic milieu .   Nanci Pina, FNP [facility] 11/09/2015, 1:52 PM I agree with assessment and plan Geralyn Flash A. Sabra Heck, M.D.

## 2015-11-09 NOTE — Progress Notes (Signed)
Pt did not attend wrap up group meeting this evening.  

## 2015-11-09 NOTE — Progress Notes (Signed)
Patient ID: Justin Chaney, male   DOB: 1996-06-20, 20 y.o.   MRN: [id number]   D: Pt has remained very agitated and irritable on the unit today. Pt stayed in the bed most of the day, he did not attend groups. Pt reported that he was just tired and wanted to be left alone. Pt refused morning dose of Prolixin and Cogentin, but then came back and took them all. Pt reported that his depression was a 5, his hopelessness was a 5, and his anxiety was a 1. Pt reported being negative SI/HI, no AH/VH noted. A: 15 min checks continued for patient safety. R: Pt safety maintained.

## 2015-11-10 NOTE — BHH Group Notes (Signed)
[place] LCSW Group Therapy  11/10/2015 3:47 PM   Type of Therapy:  Group Therapy  Participation Level:  Active  Participation Quality:  Attentive  Affect:  Appropriate  Cognitive:  Appropriate  Insight:  Improving  Engagement in Therapy:  Engaged  Modes of Intervention:  Clarification, Education, Exploration and Socialization  Summary of Progress/Problems: Today's group focused on relapse prevention.  We defined the term, and then brainstormed on ways to prevent relapse. Invited.  Stated "I am coming" but did not attend.  Roque Lias B 11/10/2015 , 3:47 PM

## 2015-11-10 NOTE — Progress Notes (Signed)
D   Pt is depressed and sad   He said he was having suicidal thoughts but did contract for safety    He did not want medications offered and did not want to talk about it   He said he just wanted me to know     A    Verbal support given    Medications administered and effectiveness monitored    Q 15 min checks R   Pt is presently safe and continues to contract for safety

## 2015-11-10 NOTE — Progress Notes (Signed)
[facility] MD Progress Note  11/10/2015 3:27 PM Justin Chaney  MRN:  [id number] Subjective:  Patient states " I am OK.'  Objective:   Patient is a 20 year old male, who is known to our unit from recent psychiatric admission   From 2/2 - 2/11 ans 03/11-03/17/2017. He was recently discharged from [facility] center on 11/05/15, where he then presented to a [place] and began to exhibit bizarre behavior.    Patient seen and chart reviewed.Discussed patient with treatment team.  Pt with fair eye contact, appears to be less anxious , less paranoid . He does not appear to be responding to internal stimuli. Pt seen as more visible in milieu . Pt has some difficulty taking his medications as prescribed - as per nursing, needs a lot of encouragement to do so. Denies any disruptive issues on the unit.     Principal Problem: Schizoaffective disorder, bipolar type ([place]) Diagnosis:   Patient Active Problem List   Diagnosis Date Noted  . Cocaine use disorder, severe, dependence ([place]) [F14.20] 11/07/2015  . Amphetamine use disorder, severe, dependence ([place]) [F15.20] 11/07/2015  . Cannabis use disorder, moderate, in sustained remission [F12.90] 11/07/2015  . Intermittent explosive disorder [F63.81] 09/05/2015  . Schizoaffective disorder, bipolar type ([place]) [F25.0] 09/04/2015  . Non compliance w medication regimen [Z91.14] 01/17/2015   Total Time spent with patient: 20 minutes  Past Medical History:  Past Medical History  Diagnosis Date  . Asthma   . Seizures ([place])   . Brain ventricular shunt obstruction     hydrochelpis  . Depression   . Bipolar disorder [facility])     Past Surgical History  Procedure Laterality Date  . Tonsillectomy    . Ventriculo-peritoneal shunt placement / laparoscopic insertion peritoneal catheter     Family History:  Family History  Problem Relation Age of Onset  . Hypertension Mother   . Mental illness Neg Hx     Social History:  History  Alcohol Use  .  7.2 oz/week  . 12 Cans of beer per week    Comment: states he no longer drinks      History  Drug Use  . Yes  . Special: Marijuana, Other-see comments, Amphetamines    Comment: Adderall, "I don't do weed, only rarely"    Social History   Social History  . Marital Status: Single    Spouse Name: N/A  . Number of Children: N/A  . Years of Education: N/A   Social History Main Topics  . Smoking status: Former Smoker -- 1.00 packs/day    Types: Cigarettes    Quit date: 10/22/2015  . Smokeless tobacco: None  . Alcohol Use: 7.2 oz/week    12 Cans of beer per week     Comment: states he no longer drinks   . Drug Use: Yes    Special: Marijuana, Other-see comments, Amphetamines     Comment: Adderall, "I don't do weed, only rarely"  . Sexual Activity: Not Asked   Other Topics Concern  . None   Social History Narrative   Additional Social History:   Sleep: Good  Appetite:  Fair  Current Medications: Current Facility-Administered Medications  Medication Dose Route Frequency Provider Last Rate Last Dose  . acetaminophen (TYLENOL) tablet 650 mg  650 mg Oral Q6H PRN Delfin Gant, NP      . alum & mag hydroxide-simeth (MAALOX/MYLANTA) 200-200-20 MG/5ML suspension 30 mL  30 mL Oral Q4H PRN Delfin Gant, NP      .  benztropine (COGENTIN) tablet 0.5 mg  0.5 mg Oral BID Ursula Alert, MD   0.5 mg at 11/10/15 1028  . fluPHENAZine (PROLIXIN) tablet 5 mg  5 mg Oral BID Ursula Alert, MD   5 mg at 11/10/15 1028  . hydrOXYzine (ATARAX/VISTARIL) tablet 25 mg  25 mg Oral Q6H PRN Ursula Alert, MD      . lithium carbonate (ESKALITH) CR tablet 450 mg  450 mg Oral BID PC Delfin Gant, NP   450 mg at 11/10/15 1029  . magnesium hydroxide (MILK OF MAGNESIA) suspension 30 mL  30 mL Oral Daily PRN Delfin Gant, NP      . nicotine (NICODERM CQ - dosed in mg/24 hours) patch 21 mg  21 mg Transdermal Daily Delfin Gant, NP   21 mg at 11/07/15 0747  . OLANZapine zydis  (ZYPREXA) disintegrating tablet 10 mg  10 mg Oral TID PRN Ursula Alert, MD       Or  . OLANZapine (ZYPREXA) injection 10 mg  10 mg Intramuscular TID PRN Ursula Alert, MD      . ondansetron (ZOFRAN) tablet 4 mg  4 mg Oral Q8H PRN Delfin Gant, NP      . traZODone (DESYREL) tablet 50 mg  50 mg Oral QHS Delfin Gant, NP   50 mg at 11/07/15 0005    Lab Results: No results found for this or any previous visit (from the past 39 hour(s)).  Blood Alcohol level:  Lab Results  Component Value Date   ETH <5 11/05/2015   ETH <5 10/11/2015    Physical Findings: AIMS: Facial and Oral Movements Muscles of Facial Expression: None, normal Lips and Perioral Area: None, normal Jaw: None, normal Tongue: None, normal,Extremity Movements Upper (arms, wrists, hands, fingers): None, normal Lower (legs, knees, ankles, toes): None, normal, Trunk Movements Neck, shoulders, hips: None, normal, Overall Severity Severity of abnormal movements (highest score from questions above): None, normal Incapacitation due to abnormal movements: None, normal Patient's awareness of abnormal movements (rate only patient's report): No Awareness, Dental Status Current problems with teeth and/or dentures?: Yes Does patient usually wear dentures?: No  CIWA:    COWS:     Musculoskeletal: Strength & Muscle Tone: within normal limits Gait & Station: normal Patient leans: N/A  Psychiatric Specialty Exam: Review of Systems  Psychiatric/Behavioral: Positive for depression, suicidal ideas and hallucinations. Negative for memory loss and substance abuse. The patient is nervous/anxious.   All other systems reviewed and are negative.  denies nausea, denies vomiting   Blood pressure 97/62, pulse 92, temperature 98.3 F (36.8 C), temperature source Oral, resp. rate 16, height 5\' 3"  (1.6 m), weight 102.96 kg (226 lb 15.8 oz), SpO2 99 %.Body mass index is 40.22 kg/(m^2).  General Appearance: Fairly Groomed  Chemical engineer::  Fair  Speech:  Slow  Volume:  Decreased  Mood:  Anxious and Depressed  Affect:  Depressed  Thought Process:  Linear  Orientation:  Full (Time, Place, and Person)  Thought Content:  Hallucinations: Auditory Visual- does not appear internally preoccupied   Suicidal Thoughts:  Yes.  without intent/plan- denies any plan or intention of suicide or of hurting self on unit   Homicidal Thoughts:  No  Memory:  recent and remote grossly intact , immediate -fair  Judgement:  Fair  Insight:  Fair  Psychomotor Activity:  Decreased  Concentration:  Good  Recall:  Good  Fund of Knowledge:Good  Language: Good  Akathisia:  Negative  Handed:  Right  AIMS (if indicated):     Assets:  Desire for Improvement Resilience  ADL's:  Impaired  Cognition: WNL  Sleep:  Number of Hours: 4.75   Assessment - Patient presents less irritable,with fair eye contact, continues to have passive SI as well as AH and delusions - although progressing.Will continue treatment.    Treatment Plan Summary: Daily contact with patient to assess and evaluate symptoms and progress in treatment, Medication management, Plan inpatient admission and medications as below    Treatment Plan Summary:  Daily contact with patient to assess and evaluate symptoms and progress in treatment and Medication management   Reviewed past medical records,treatment plan.  Will continue Prolixin 5 mg po bid for psychosis.  Will continue  Cogentin 0.5 mg po bid for EPS. Will continue Lithium CR 450 mg po bid for mood swings. Li level - 0.27 ( 11/05/15) - will get another level in 5 days since there is a question about compliance.- next level on 11/12/15. Will continue Trazodone 50 mg po qhs for sleep.  Will continue to monitor vitals ,medication compliance and treatment side effects while patient is here.  Will monitor for medical issues as well as call consult as needed.  Reviewed labs cbc - wnl, bmp - shows K+ - 3.4 , tsh - wnl,  lipid panel, hba1c, PL - wnl ( 09/06/15) ,  EKG -for qtc- wnl .  CSW will continue  working on disposition.  Patient to participate in therapeutic milieu .   Freja Faro, MD  11/10/2015, 3:27 PM

## 2015-11-10 NOTE — Plan of Care (Signed)
Problem: Alteration in thought process Goal: STG-Patient does not respond to command hallucinations Outcome: Progressing Pt denies AVH and does not appears to be responding to internal stimuli

## 2015-11-10 NOTE — BHH Group Notes (Signed)
[facility] LCSW Aftercare Discharge Planning Group Note   11/10/2015 11:30 AM  Participation Quality:  Invited.  Did not attend    [place], Justin Chaney

## 2015-11-10 NOTE — Progress Notes (Signed)
DAR NOTE: Patient presents with anxious affect and depressed mood.  Reports suicidal thoughts, auditory and visual hallucinations but contracts for safety.  Rates depression at 4, hopelessness at 4, and anxiety at 4.  describes energy level as normal and concentration as good.  Maintained on routine safety checks.  Medications given as prescribed.  Support and encouragement offered as needed.  Attended group and participated.    Patient observed socializing with peers in the dayroom.  Offered no complaint.

## 2015-11-10 NOTE — Progress Notes (Signed)
Pt did not attend wrap up group meeting this evening.  

## 2015-11-11 MED ORDER — BENZTROPINE MESYLATE 0.5 MG PO TABS
0.5000 mg | ORAL_TABLET | Freq: Every day | ORAL | Status: DC
  Administered 2015-11-12 – 2015-11-13 (×2): 0.5 mg via ORAL
  Filled 2015-11-11 (×4): qty 1

## 2015-11-11 MED ORDER — FLUPHENAZINE HCL 5 MG PO TABS
5.0000 mg | ORAL_TABLET | Freq: Every day | ORAL | Status: DC
  Administered 2015-11-12 – 2015-11-13 (×2): 5 mg via ORAL
  Filled 2015-11-11 (×4): qty 1

## 2015-11-11 MED ORDER — FLUPHENAZINE HCL 10 MG PO TABS
10.0000 mg | ORAL_TABLET | Freq: Every day | ORAL | Status: DC
  Administered 2015-11-12 (×2): 10 mg via ORAL
  Filled 2015-11-11 (×4): qty 1

## 2015-11-11 MED ORDER — BENZTROPINE MESYLATE 0.5 MG PO TABS
0.5000 mg | ORAL_TABLET | Freq: Every day | ORAL | Status: DC
  Administered 2015-11-12 (×2): 0.5 mg via ORAL
  Filled 2015-11-11 (×4): qty 1

## 2015-11-11 NOTE — Progress Notes (Signed)
D:Affect is flat at times,mood is depressed. States that is not having any thoughts of harming self today and does contract for safety. Offers minimal conversation and required prompting to minimally participate in group this AM. Med compliant. A:Support and encouragement offered. R:Receptive. No complaints of pain or problems at this time.

## 2015-11-11 NOTE — Progress Notes (Signed)
Recreation Therapy Notes  Animal-Assisted Activity (AAA) Program Checklist/Progress Notes Patient Eligibility Criteria Checklist & Daily Group note for Rec Tx Intervention  Date: 04.12.2017 Time: 2:45pm Location: [address]   AAA/T Program Assumption of Risk Form signed by Patient/ or Parent Legal Guardian NO  Behavioral Response: Did not attend.   Clinical Observations/Feedback: MD evaluated patient for appropriateness in pet therapy session, discussed with LRT. Patient offered participation in pet therapy session, but declined. Patient observed to be in bed, with covers pulled up to his face. Patient declined participation by nodding, but made no verbal statements to LRT.    Laureen Ochs Jamera Vanloan, LRT/CTRS        Leandro Berkowitz L 11/11/2015 3:10 PM

## 2015-11-11 NOTE — Progress Notes (Signed)
[facility] MD Progress Note  11/11/2015 11:28 AM Justin Chaney  MRN:  [id number] Subjective:  Patient today states " I am not doing too well."   Objective:   Patient is a 20 year old male, who is known to our unit from recent psychiatric admission   From 2/2 - 2/11 ans 03/11-03/17/2017. He was recently discharged from [facility] center on 11/05/15, where he then presented to a [place] and began to exhibit bizarre behavior.    Patient seen and chart reviewed.Discussed patient with treatment team.  Pt today with minimal eye contact , appears irritable , states that none of the medications are effective and that he feels bad.  Pt reports he has decided to kill self by starving and did not take his AM breakfast today. Pt reports that he has been trying to live with the voices , but it is frustrating . He reports that he continues to have AH and VH which are out of this world and he is unable to elaborate on them.  Per staff pt does not attend groups , but did go outside yesterday. Pt is mostly withdrawn , however he  does not appear to be responding to internal stimuli. Pt takes medications with a lot of encouragement.  Will monitor patient on the unit .   Principal Problem: Schizoaffective disorder, bipolar type ([place]) Diagnosis:   Patient Active Problem List   Diagnosis Date Noted  . Cocaine use disorder, severe, dependence ([place]) [F14.20] 11/07/2015  . Amphetamine use disorder, severe, dependence ([place]) [F15.20] 11/07/2015  . Cannabis use disorder, moderate, in sustained remission [F12.90] 11/07/2015  . Intermittent explosive disorder [F63.81] 09/05/2015  . Schizoaffective disorder, bipolar type ([place]) [F25.0] 09/04/2015  . Non compliance w medication regimen [Z91.14] 01/17/2015   Total Time spent with patient: 20 minutes  Past Medical History:  Past Medical History  Diagnosis Date  . Asthma   . Seizures ([place])   . Brain ventricular shunt obstruction     hydrochelpis  .  Depression   . Bipolar disorder [facility])     Past Surgical History  Procedure Laterality Date  . Tonsillectomy    . Ventriculo-peritoneal shunt placement / laparoscopic insertion peritoneal catheter     Family History:  Family History  Problem Relation Age of Onset  . Hypertension Mother   . Mental illness Neg Hx     Social History:  History  Alcohol Use  . 7.2 oz/week  . 12 Cans of beer per week    Comment: states he no longer drinks      History  Drug Use  . Yes  . Special: Marijuana, Other-see comments, Amphetamines    Comment: Adderall, "I don't do weed, only rarely"    Social History   Social History  . Marital Status: Single    Spouse Name: N/A  . Number of Children: N/A  . Years of Education: N/A   Social History Main Topics  . Smoking status: Former Smoker -- 1.00 packs/day    Types: Cigarettes    Quit date: 10/22/2015  . Smokeless tobacco: None  . Alcohol Use: 7.2 oz/week    12 Cans of beer per week     Comment: states he no longer drinks   . Drug Use: Yes    Special: Marijuana, Other-see comments, Amphetamines     Comment: Adderall, "I don't do weed, only rarely"  . Sexual Activity: Not Asked   Other Topics Concern  . None   Social History Narrative   Additional  Social History:   Sleep: Fair  Appetite:  Poor wants to starve self to death  Current Medications: Current Facility-Administered Medications  Medication Dose Route Frequency Provider Last Rate Last Dose  . acetaminophen (TYLENOL) tablet 650 mg  650 mg Oral Q6H PRN Delfin Gant, NP      . alum & mag hydroxide-simeth (MAALOX/MYLANTA) 200-200-20 MG/5ML suspension 30 mL  30 mL Oral Q4H PRN Delfin Gant, NP      . Derrill Memo ON 11/12/2015] benztropine (COGENTIN) tablet 0.5 mg  0.5 mg Oral Daily Zamauri Nez, MD      . benztropine (COGENTIN) tablet 0.5 mg  0.5 mg Oral QHS Davian Hanshaw, MD      . fluPHENAZine (PROLIXIN) tablet 10 mg  10 mg Oral QHS Ursula Alert, MD      . Derrill Memo  ON 11/12/2015] fluPHENAZine (PROLIXIN) tablet 5 mg  5 mg Oral Daily Kirstine Jacquin, MD      . hydrOXYzine (ATARAX/VISTARIL) tablet 25 mg  25 mg Oral Q6H PRN Ursula Alert, MD      . lithium carbonate (ESKALITH) CR tablet 450 mg  450 mg Oral BID PC Delfin Gant, NP   450 mg at 11/11/15 0843  . magnesium hydroxide (MILK OF MAGNESIA) suspension 30 mL  30 mL Oral Daily PRN Delfin Gant, NP      . OLANZapine zydis (ZYPREXA) disintegrating tablet 10 mg  10 mg Oral TID PRN Ursula Alert, MD       Or  . OLANZapine (ZYPREXA) injection 10 mg  10 mg Intramuscular TID PRN Ursula Alert, MD      . ondansetron (ZOFRAN) tablet 4 mg  4 mg Oral Q8H PRN Delfin Gant, NP      . traZODone (DESYREL) tablet 50 mg  50 mg Oral QHS Delfin Gant, NP   50 mg at 11/07/15 0005    Lab Results: No results found for this or any previous visit (from the past 42 hour(s)).  Blood Alcohol level:  Lab Results  Component Value Date   ETH <5 11/05/2015   ETH <5 10/11/2015    Physical Findings: AIMS: Facial and Oral Movements Muscles of Facial Expression: None, normal Lips and Perioral Area: None, normal Jaw: None, normal Tongue: None, normal,Extremity Movements Upper (arms, wrists, hands, fingers): None, normal Lower (legs, knees, ankles, toes): None, normal, Trunk Movements Neck, shoulders, hips: None, normal, Overall Severity Severity of abnormal movements (highest score from questions above): None, normal Incapacitation due to abnormal movements: None, normal Patient's awareness of abnormal movements (rate only patient's report): No Awareness, Dental Status Current problems with teeth and/or dentures?: Yes Does patient usually wear dentures?: No  CIWA:    COWS:     Musculoskeletal: Strength & Muscle Tone: within normal limits Gait & Station: normal Patient leans: N/A  Psychiatric Specialty Exam: Review of Systems  Psychiatric/Behavioral: Positive for depression, suicidal ideas and  hallucinations. Negative for memory loss and substance abuse. The patient is nervous/anxious.   All other systems reviewed and are negative.  denies nausea, denies vomiting   Blood pressure 97/62, pulse 92, temperature 98.3 F (36.8 C), temperature source Oral, resp. rate 16, height 5\' 3"  (1.6 m), weight 102.96 kg (226 lb 15.8 oz), SpO2 99 %.Body mass index is 40.22 kg/(m^2).  General Appearance: Fairly Groomed  Engineer, water::  Minimal  Speech:  Normal Rate  Volume:  Decreased  Mood:  Anxious, Depressed and Irritable  Affect:  Congruent  Thought Process:  Linear  Orientation:  Full (Time, Place, and Person)  Thought Content:  Hallucinations: Auditory Visual- does not appear internally preoccupied   Suicidal Thoughts:  Yes.  with intent/plan wants to starve self to death  Homicidal Thoughts:  No  Memory:  recent and remote grossly intact , immediate -fair  Judgement:  Fair  Insight:  Fair  Psychomotor Activity:  Normal  Concentration:  Good  Recall:  Good  Fund of Knowledge:Good  Language: Good  Akathisia:  Negative  Handed:  Right  AIMS (if indicated):     Assets:  Desire for Improvement Resilience  ADL's:  Impaired  Cognition: WNL  Sleep:  Number of Hours: 4.75   Assessment - Patient presents as irritable today , continues to report AH/VH , although not seen as responding to internal stimuli. Pt today reports SI with plan to starve self to death.Will continue treatment.    Treatment Plan Summary: Daily contact with patient to assess and evaluate symptoms and progress in treatment, Medication management, Plan inpatient admission and medications as below    Treatment Plan Summary:  Daily contact with patient to assess and evaluate symptoms and progress in treatment and Medication management   Reviewed past medical records,treatment plan.  Will increase Prolixin to 5 mg po daily and 10 mg po qhs for psychosis.  Will continue  Cogentin 0.5 mg po bid for EPS. Will  continue Lithium CR 450 mg po bid for mood swings. Li level - 0.27 ( 11/05/15) - will get another level in 5 days since there is a question about compliance.- next level on 11/12/15. Will continue Trazodone 50 mg po qhs for sleep.  Will continue to monitor vitals ,medication compliance and treatment side effects while patient is here.  Will monitor for medical issues as well as call consult as needed.  Reviewed labs cbc - wnl, bmp - shows K+ - 3.4 , tsh - wnl, lipid panel, hba1c, PL - wnl ( 09/06/15) ,  EKG -for qtc- wnl .  CSW will continue  working on disposition.  Recreational therapy consult. Patient to participate in therapeutic milieu .   Sua Spadafora, MD  11/11/2015, 11:28 AM

## 2015-11-11 NOTE — BHH Group Notes (Signed)
[facility] LCSW Group Therapy  11/11/2015 1:15 pm  Type of Therapy: Process Group Therapy  Participation Level:  Active  Participation Quality:  Appropriate  Affect:  Flat  Cognitive:  Oriented  Insight:  Improving  Engagement in Group:  Limited  Engagement in Therapy:  Limited  Modes of Intervention:  Activity, Clarification, Education, Problem-solving and Support  Summary of Progress/Problems: Today's group addressed the issue of overcoming obstacles.  Patients were asked to identify their biggest obstacle post d/c that stands in the way of their on-going success, and then problem solve as to how to manage this.  Admitted that he comes to the hospital regularly "because I get tired of the voices and faces."  Not willing to discuss his compliance with treatment outside of the hospital, but was open to peers' feedback about figuring out first how to identify symptoms, and then some skills for coping with them.  Roque Lias B 11/11/2015   1:13 PM

## 2015-11-11 NOTE — BHH Group Notes (Signed)
[facility] Group Notes:  (Nursing/MHT/Case Management/Adjunct)  Date:  11/11/2015  Time:  1:27 PM  Type of Therapy:  Nurse Education  Participation Level:  Minimal  Participation Quality:  Inattentive  Affect:  Appropriate  Cognitive:  Alert  Insight:  Improving  Engagement in Group:  Limited and Poor  Modes of Intervention:  Clarification, Discussion, Education and Support  Summary of Progress/Problems:Pt with minimal participation despite multiple prompts to participate however did offer some feedback to peer that was going to be discharged today.  Yehuda Budd 11/11/2015, 1:27 PM

## 2015-11-11 NOTE — Progress Notes (Signed)
Pt did not attend wrap up meeting this evening.

## 2015-11-11 NOTE — Tx Team (Signed)
Interdisciplinary Treatment Plan Update (Adult)  Date:  11/11/2015 Time Reviewed:  3:05 PM  Progress in Treatment: Attending groups: Yes. Participating in groups: Yes. Taking medication as prescribed:  Yes. Tolerating medication:  Yes. Family/Significant othe contact made:  No Patient understands diagnosis:  Yes, as evidenced by seeking help with AVH and delusions. Discussing patient identified problems/goals with staff:  Yes, see initial care plan. Medical problems stabilized or resolved:  Yes Denies suicidal/homicidal ideation: Yes. Issues/concerns per patient self-inventory: No. Other:  New problem(s) identified:   Discharge Plan or Barriers: see below  Reason for Continuation of Hospitalization: Delusions  Hallucinations Medication stabilization  Comments: Pt is a 20 yr old AA male who is single , employed -does odd jobs , lives with friends in [place] , has a hx of schizoaffective do as well as polysubstance abuse, presented to [facility] With psychosis." Pt was discharged from [facility] hospital on 11/05/15 . He went to [employer] and people were concerned and called 911 since he was acting bizarre. Pt stated "I am seeing and hearing things". "I believe the world will bow down to me because I believe that I am God the flesh". "I know that I am not human". "My dreams are weird, like demonic". "I am trying to get involuntarily committed and sent off to some place for long term; that's what I need". Cogentin, Prolixin, Lithium, Desyrel trial  11/11/15:  Justin Chaney has been told multiple times by Dr that he will not be sent to [facility] from here. Will increase Prolixin to 5 mg po daily and 10 mg po qhs for psychosis.  Will continue Cogentin 0.5 mg po bid for EPS. Will continue Lithium CR 450 mg po bid for mood swings. Li level - 0.27 ( 11/05/15) - will get another level in 5 days since there is a question about compliance.- next level on 11/12/15. Will continue Trazodone 50 mg po qhs for  sleep. Recreational therapy consult.  Estimated length of stay: Likely d/c tomorrow  New goal(s):  Review of initial/current patient goals per problem list:  1. Goal(s): Patient will participate in aftercare plan  Met:Yes  Target date: at discharge  As evidenced by: Patient will participate within aftercare plan AEB aftercare provider and housing plan at discharge being identified.  11/07/15: Pt will return to his friend's house and follow up outpt with [facility].  4. Goal(s): Patient will demonstrate decreased signs of psychosis.  Met: Yes   Target date:at discharge  As evidenced by: Patient will demonstrate decreased signs of psychosis as evidenced by a reduction in AVH, paranoia, and/or delusions.   11/07/15: Pt endorses AVH and believes that he is [name].  [place] 11/11/15:  Pt states his symptoms are greatly reduced.  Attendees: Patient:  11/11/2015 3:05 PM  Family:   11/11/2015 3:05 PM  Physician:  Dr. Ursula Alert, MD 11/11/2015 3:05 PM  Nursing: Serita Grammes, RN   11/11/2015 3:05 PM  Case Manager:  Roque Lias, LCSW 11/11/2015 3:05 PM  Counselor:  Matthew Saras, MSW Intern 11/11/2015 3:05 PM  Other:   11/11/2015 3:05 PM  Other:   11/11/2015 3:05 PM  Other:   11/11/2015 3:05 PM  Other:  11/11/2015 3:05 PM  Other:    Other:    Other:    Other:    Other:    Other:      Scribe for Treatment Team:   Georga Kaufmann, MSW Intern 11/11/2015 3:05 PM

## 2015-11-12 NOTE — Progress Notes (Signed)
Adult Psychoeducational Group Note  Date:  11/12/2015 Time:  8:53 PM  Group Topic/Focus:  Wrap-Up Group:   The focus of this group is to help patients review their daily goal of treatment and discuss progress on daily workbooks.  Participation Level:  Active  Participation Quality:  Appropriate and Attentive  Affect:  Appropriate  Cognitive:  Appropriate  Insight: Appropriate and Good  Engagement in Group:  Engaged  Modes of Intervention:  Discussion  Additional Comments:  Pt rated his day an 9 out of 13. Pt was happy that somebody donated some clothes to him. Pt goal for tomorrow is to attend all groups.   Jerline Pain 11/12/2015, 8:53 PM

## 2015-11-12 NOTE — Progress Notes (Signed)
[facility] MD Progress Note  11/12/2015 10:48 AM Justin Chaney  MRN:  [id number] Subjective:  Patient today states " I just woke up ."   Objective:   Patient is a 20 year old male, who is known to our unit from recent psychiatric admission   From 2/2 - 2/11 ans 03/11-03/17/2017. He was recently discharged from [facility] center on 11/05/15, where he then presented to a [place] and began to exhibit bizarre behavior.    Patient seen and chart reviewed.Discussed patient with treatment team.  Pt today with minimal eye contact , appears to be vaguely irritable ,however is cooperative with the evaluation. Pt reports that he continues to be anxious , denies any psychosis this AM. Pt also reports sleep as fair last night. Per staff - pt continues to need a lot of encouragement to take his medications as well as attend groups . Will monitor patient on the unit .   Principal Problem: Schizoaffective disorder, bipolar type ([place]) Diagnosis:   Patient Active Problem List   Diagnosis Date Noted  . Cocaine use disorder, severe, dependence ([place]) [F14.20] 11/07/2015  . Amphetamine use disorder, severe, dependence ([place]) [F15.20] 11/07/2015  . Cannabis use disorder, moderate, in sustained remission [F12.90] 11/07/2015  . Intermittent explosive disorder [F63.81] 09/05/2015  . Schizoaffective disorder, bipolar type ([place]) [F25.0] 09/04/2015  . Non compliance w medication regimen [Z91.14] 01/17/2015   Total Time spent with patient: 20 minutes  Past Medical History:  Past Medical History  Diagnosis Date  . Asthma   . Seizures ([place])   . Brain ventricular shunt obstruction     hydrochelpis  . Depression   . Bipolar disorder [facility])     Past Surgical History  Procedure Laterality Date  . Tonsillectomy    . Ventriculo-peritoneal shunt placement / laparoscopic insertion peritoneal catheter     Family History:  Family History  Problem Relation Age of Onset  . Hypertension Mother   . Mental  illness Neg Hx     Social History:  History  Alcohol Use  . 7.2 oz/week  . 12 Cans of beer per week    Comment: states he no longer drinks      History  Drug Use  . Yes  . Special: Marijuana, Other-see comments, Amphetamines    Comment: Adderall, "I don't do weed, only rarely"    Social History   Social History  . Marital Status: Single    Spouse Name: N/A  . Number of Children: N/A  . Years of Education: N/A   Social History Main Topics  . Smoking status: Former Smoker -- 1.00 packs/day    Types: Cigarettes    Quit date: 10/22/2015  . Smokeless tobacco: None  . Alcohol Use: 7.2 oz/week    12 Cans of beer per week     Comment: states he no longer drinks   . Drug Use: Yes    Special: Marijuana, Other-see comments, Amphetamines     Comment: Adderall, "I don't do weed, only rarely"  . Sexual Activity: Not Asked   Other Topics Concern  . None   Social History Narrative   Additional Social History:   Sleep: Fair  Appetite:  Poor   Current Medications: Current Facility-Administered Medications  Medication Dose Route Frequency Provider Last Rate Last Dose  . acetaminophen (TYLENOL) tablet 650 mg  650 mg Oral Q6H PRN Delfin Gant, NP      . alum & mag hydroxide-simeth (MAALOX/MYLANTA) 200-200-20 MG/5ML suspension 30 mL  30 mL Oral  Q4H PRN Delfin Gant, NP      . benztropine (COGENTIN) tablet 0.5 mg  0.5 mg Oral Daily Jayzen Paver, MD   0.5 mg at 11/12/15 0848  . benztropine (COGENTIN) tablet 0.5 mg  0.5 mg Oral QHS Tierra Thoma, MD   0.5 mg at 11/12/15 0002  . fluPHENAZine (PROLIXIN) tablet 10 mg  10 mg Oral QHS Jaylon Grode, MD   10 mg at 11/12/15 0001  . fluPHENAZine (PROLIXIN) tablet 5 mg  5 mg Oral Daily Ursula Alert, MD   5 mg at 11/12/15 0848  . hydrOXYzine (ATARAX/VISTARIL) tablet 25 mg  25 mg Oral Q6H PRN Ursula Alert, MD      . lithium carbonate (ESKALITH) CR tablet 450 mg  450 mg Oral BID PC Delfin Gant, NP   450 mg at 11/12/15  0848  . magnesium hydroxide (MILK OF MAGNESIA) suspension 30 mL  30 mL Oral Daily PRN Delfin Gant, NP      . OLANZapine zydis (ZYPREXA) disintegrating tablet 10 mg  10 mg Oral TID PRN Ursula Alert, MD       Or  . OLANZapine (ZYPREXA) injection 10 mg  10 mg Intramuscular TID PRN Ursula Alert, MD      . ondansetron (ZOFRAN) tablet 4 mg  4 mg Oral Q8H PRN Delfin Gant, NP      . traZODone (DESYREL) tablet 50 mg  50 mg Oral QHS Delfin Gant, NP   50 mg at 11/12/15 0001    Lab Results: No results found for this or any previous visit (from the past 50 hour(s)).  Blood Alcohol level:  Lab Results  Component Value Date   ETH <5 11/05/2015   ETH <5 10/11/2015    Physical Findings: AIMS: Facial and Oral Movements Muscles of Facial Expression: None, normal Lips and Perioral Area: None, normal Jaw: None, normal Tongue: None, normal,Extremity Movements Upper (arms, wrists, hands, fingers): None, normal Lower (legs, knees, ankles, toes): None, normal, Trunk Movements Neck, shoulders, hips: None, normal, Overall Severity Severity of abnormal movements (highest score from questions above): None, normal Incapacitation due to abnormal movements: None, normal Patient's awareness of abnormal movements (rate only patient's report): No Awareness, Dental Status Current problems with teeth and/or dentures?: Yes Does patient usually wear dentures?: No  CIWA:    COWS:     Musculoskeletal: Strength & Muscle Tone: within normal limits Gait & Station: normal Patient leans: N/A  Psychiatric Specialty Exam: Review of Systems  Psychiatric/Behavioral: Positive for depression. Negative for hallucinations, memory loss and substance abuse. The patient is nervous/anxious.   All other systems reviewed and are negative.  denies nausea, denies vomiting   Blood pressure 97/62, pulse 92, temperature 98.3 F (36.8 C), temperature source Oral, resp. rate 16, height 5\' 3"  (1.6 m), weight  102.96 kg (226 lb 15.8 oz), SpO2 99 %.Body mass index is 40.22 kg/(m^2).  General Appearance: Fairly Groomed  Engineer, water::  Minimal  Speech:  Normal Rate  Volume:  Decreased  Mood:  Anxious, Depressed and Irritable  Affect:  Congruent  Thought Process:  Linear  Orientation:  Full (Time, Place, and Person)  Thought Content:  Rumination denies AH this AM , does not appear internally preoccupied   Suicidal Thoughts:  No   Homicidal Thoughts:  No  Memory:  recent and remote grossly intact , immediate -fair  Judgement:  Fair  Insight:  Fair  Psychomotor Activity:  Normal  Concentration:  Good  Recall:  [place]  of Knowledge:Good  Language: Good  Akathisia:  Negative  Handed:  Right  AIMS (if indicated):     Assets:  Desire for Improvement Resilience  ADL's:  Impaired  Cognition: WNL  Sleep:  Number of Hours: 6.25   Assessment - Patient presents as irritable today , denies AH/VH , although not seen as responding to internal stimuli. Pt continues to need inpatient stay .       Treatment Plan Summary: Daily contact with patient to assess and evaluate symptoms and progress in treatment, Medication management, Plan inpatient admission and medications as below    Treatment Plan Summary:  Daily contact with patient to assess and evaluate symptoms and progress in treatment and Medication management   Reviewed past medical records,treatment plan.  Will continue Prolixin to 5 mg po daily and 10 mg po qhs for psychosis.  Will continue  Cogentin 0.5 mg po bid for EPS. Will continue Lithium CR 450 mg po bid for mood swings. Li level - 0.27 ( 11/05/15) - pt refuses Li level today 11/12/15.Will reorder for tomorrow AM. Will continue Trazodone 50 mg po qhs for sleep.  Will continue to monitor vitals ,medication compliance and treatment side effects while patient is here.  Will monitor for medical issues as well as call consult as needed.  Reviewed labs cbc - wnl, bmp - shows K+ - 3.4  , tsh - wnl, lipid panel, hba1c, PL - wnl ( 09/06/15) ,  EKG -for qtc- wnl .  CSW will continue  working on disposition.  Recreational therapy consult. Patient to participate in therapeutic milieu .   Marai Teehan, MD  11/12/2015, 10:48 AM

## 2015-11-12 NOTE — Progress Notes (Signed)
D   Pt is depressed and has spent much of the evening in his room    He got his medications late tonight  And is presently up arguing about his clothes    Pt was reluctant to take his medications but did finally take them   He has not mentioned being suicidal this evening  A    Verbal support given    Medications administered and effectiveness monitored    Q 15 min checks R   Pt safe at present

## 2015-11-12 NOTE — Progress Notes (Signed)
Patient ID: Justin Chaney, male   DOB: 09-15-1995, 20 y.o.   MRN: [id number]  DAR: Pt. Denies SI/HI and A/V Hallucinations during writer's assessment but he reports he is able to contract for safety if feeling unsafe. He reports sleep is good, appetite is fair, energy level is normal, and concentration is good. He rates depression, anxiety, and hopelessness 6/10. He asked Probation officer when he would be discharged this morning and at this time writer was not aware. Patient stated, "I want to stay as long as I can." Patient does not report any pain at this time. Support and encouragement provided to the patient. Scheduled medications administered to patient per physician's orders. Patient is minimal and stays to himself. He initially reported not feeling well this morning and refused vitals. However, when writer asked patient to elaborate he stated that he just didn't feel well and refused to say anymore. Shortly after he came into the milieu and interacted with his peers and allowed staff to take vitals. Q15 minute checks are maintained for safety.

## 2015-11-12 NOTE — B[facility] Group Notes (Signed)
[facility] LCSW Group Therapy  11/12/2015 1:40 PM  Type of Therapy: Group Therapy  Participation Level: Invited. Chose not to attend.  Summary of Progress/Problems: Justin Chaney from the [facility] was here to tell his story of recovery and play his guitar.  [name]. [name] 11/12/2015 1:40 PM

## 2015-11-12 NOTE — Progress Notes (Signed)
D: Pt refused AM lab work request by Doctor. Pt states "I'm going home today". A: Nurse notified R: 15 minute checks will continue for pt safety.

## 2015-11-13 DIAGNOSIS — F250 Schizoaffective disorder, bipolar type: Principal | ICD-10-CM

## 2015-11-13 LAB — LITHIUM LEVEL: Lithium Lvl: 0.27 mmol/L — ABNORMAL LOW (ref 0.60–1.20)

## 2015-11-13 MED ORDER — FLUPHENAZINE HCL 5 MG PO TABS: 5.0000 mg | ORAL_TABLET | Freq: Every day | ORAL | Status: DC

## 2015-11-13 MED ORDER — BENZTROPINE MESYLATE 0.5 MG PO TABS: 0.5000 mg | ORAL_TABLET | Freq: Two times a day (BID) | ORAL | Status: DC

## 2015-11-13 MED ORDER — TRAZODONE HCL 50 MG PO TABS: 50.0000 mg | ORAL_TABLET | Freq: Every day | ORAL | Status: DC

## 2015-11-13 MED ORDER — NICOTINE 21 MG/24HR TD PT24: 21.0000 mg | MEDICATED_PATCH | Freq: Every day | TRANSDERMAL | Status: DC

## 2015-11-13 MED ORDER — FLUPHENAZINE HCL 10 MG PO TABS: 10.0000 mg | ORAL_TABLET | Freq: Every day | ORAL | Status: DC

## 2015-11-13 MED ORDER — HYDROXYZINE HCL 25 MG PO TABS: 25.0000 mg | ORAL_TABLET | Freq: Four times a day (QID) | ORAL | Status: DC | PRN

## 2015-11-13 MED ORDER — LITHIUM CARBONATE ER 450 MG PO TBCR: 450.0000 mg | EXTENDED_RELEASE_TABLET | Freq: Two times a day (BID) | ORAL | Status: DC

## 2015-11-13 MED ORDER — BENZTROPINE MESYLATE 0.5 MG PO TABS
0.5000 mg | ORAL_TABLET | ORAL | Status: DC
  Filled 2015-11-13 (×2): qty 1

## 2015-11-13 NOTE — Discharge Summary (Signed)
Physician Discharge Summary Note  Patient:  Justin Chaney is an 20 y.o., male MRN:  [id number] DOB:  12-14-1995  Patient phone:  [phone] (home)   Patient address:   [place] [address],   Total Time spent with patient: Greater than 30 minutes  Date of Admission:  11/06/2015  Date of Discharge: 11/13/2015   Reason for Admission:  History of Present Illness:: Justin Chaney is a 20 yr old AA male who is single , employed -does odd jobs , lives with friends in [place] , has a hx of schizoaffective do as well as polysubstance abuse, presented to [facility] With psychosis.  Per initial notes in EHR " Pt was discharged from [facility] on 11/05/15 . He went to [employer] and people were concerned and called 911 since he was acting bizarre. Pt stated "I am seeing and hearing things". "I believe the world will bow down to me because I believe that I am God the flesh". "I know that I am not human". "My dreams are weird, like demonic". "I am trying to get involuntarily committed and sent off to some place for long term; that's what I need".   Discussed patient with treatment team. Pt today is seen as psychotic. Pt reports that he was discharged from [facility] and he continued to see and hear things. Pt reports that he feels that he is not human and that he is the son of God- [name]. Pt also reports that he has been seeing and hearing things that he cannot comprehend and he has been communicating to them back in a language that he cannot understand.Pt reports that he needs long term care and that he needs to be transferred to [facility].  Principal Problem: Schizoaffective disorder, bipolar type [facility])  Discharge Diagnoses: Patient Active Problem List   Diagnosis Date Noted  . Cocaine use disorder, severe, dependence ([place]) [F14.20] 11/07/2015  . Amphetamine use disorder, severe, dependence ([place]) [F15.20] 11/07/2015  . Cannabis use disorder, moderate, in sustained remission [F12.90]  11/07/2015  . Intermittent explosive disorder [F63.81] 09/05/2015  . Schizoaffective disorder, bipolar type ([place]) [F25.0] 09/04/2015  . Non compliance w medication regimen [Z91.14] 01/17/2015   Past Psychiatric History: HX. Schizoaffective disorder, bipolar-type.  Past Medical History:  Past Medical History  Diagnosis Date  . Asthma   . Seizures ([place])   . Brain ventricular shunt obstruction     hydrochelpis  . Depression   . Bipolar disorder [facility])     Past Surgical History  Procedure Laterality Date  . Tonsillectomy    . Ventriculo-peritoneal shunt placement / laparoscopic insertion peritoneal catheter     Family History:  Family History  Problem Relation Age of Onset  . Hypertension Mother   . Mental illness Neg Hx    Family Psychiatric  History: See H&P  Social History:  History  Alcohol Use  . 7.2 oz/week  . 12 Cans of beer per week    Comment: states he no longer drinks      History  Drug Use  . Yes  . Special: Marijuana, Other-see comments, Amphetamines    Comment: Adderall, "I don't do weed, only rarely"    Social History   Social History  . Marital Status: Single    Spouse Name: N/A  . Number of Children: N/A  . Years of Education: N/A   Social History Main Topics  . Smoking status: Former Smoker -- 1.00 packs/day    Types: Cigarettes    Quit date:  10/22/2015  . Smokeless tobacco: None  . Alcohol Use: 7.2 oz/week    12 Cans of beer per week     Comment: states he no longer drinks   . Drug Use: Yes    Special: Marijuana, Other-see comments, Amphetamines     Comment: Adderall, "I don't do weed, only rarely"  . Sexual Activity: Not Asked   Other Topics Concern  . None   Social History Narrative    Hospital Course:   Ammanuel Friebel was admitted for Schizoaffective disorder, bipolar type ([place]) , with psychosis and crisis management.  Pt was treated discharged with the medications listed below under Medication List.  Medical problems were  identified and treated as needed.  Home medications were restarted as appropriate.  Improvement was monitored by observation and Justin Chaney 's daily report of symptom reduction.  Emotional and mental status was monitored by daily self-inventory reports completed by Justin Chaney and clinical staff.         Justin Chaney was evaluated by the treatment team for stability and plans for continued recovery upon discharge. Justin Chaney 's motivation was an integral factor for scheduling further treatment. Employment, transportation, bed availability, health status, family support, and any pending legal issues were also considered during hospital stay. Pt was offered further treatment options upon discharge including but not limited to Residential, Intensive Outpatient, and Outpatient treatment.  Justin Chaney will follow up with the services as listed below under Follow Up Information.     Upon completion of this admission the patient was both mentally and medically stable for discharge denying suicidal/homicidal ideation, auditory/visual/tactile hallucinations, delusional thoughts and paranoia.    Justin Chaney responded well to treatment with Vistaril, Nicoderm, Cogentin, Prolixin, Eskalith, Lithium, Latuda, Trazodone without adverse effects. Pt demonstrated improvement without reported or observed adverse effects to the point of stability appropriate for outpatient management. Pertinent labs include: UDS + amphetamines and barbiturates. Reviewed CBC, CMP, BAL, and UDS; all unremarkable aside from noted exceptions.   Physical Findings: AIMS: Facial and Oral Movements Muscles of Facial Expression: None, normal Lips and Perioral Area: None, normal Jaw: None, normal Tongue: None, normal,Extremity Movements Upper (arms, wrists, hands, fingers): None, normal Lower (legs, knees, ankles, toes): None, normal, Trunk Movements Neck, shoulders, hips: None, normal, Overall Severity Severity  of abnormal movements (highest score from questions above): None, normal Incapacitation due to abnormal movements: None, normal Patient's awareness of abnormal movements (rate only patient's report): No Awareness, Dental Status Current problems with teeth and/or dentures?: Yes Does patient usually wear dentures?: No  CIWA:    COWS:     Musculoskeletal: Strength & Muscle Tone: within normal limits Gait & Station: normal Patient leans: N/A  Psychiatric Specialty Exam: SEE SRA BY MD  Review of Systems  Constitutional: Negative.   HENT: Negative.   Eyes: Negative.   Respiratory: Negative.   Cardiovascular: Negative.   Gastrointestinal: Negative.   Genitourinary: Negative.   Musculoskeletal: Negative.   Skin: Negative.   Neurological: Negative.   Endo/Heme/Allergies: Negative.   Psychiatric/Behavioral: Positive for depression (Stable ) and substance abuse (Hx. Cocaine abuse). Negative for suicidal ideas, hallucinations and memory loss. The patient has insomnia (Stable). The patient is not nervous/anxious.   All other systems reviewed and are negative. See Md's SRA  Blood pressure 113/51, pulse 105, temperature 98.2 F (36.8 C), temperature source Oral, resp. rate 20, height 5\' 3"  (1.6 m), weight 102.96 kg (226 lb 15.8 oz), SpO2 99 %.Body mass index is 40.22 kg/(m^2).  Have you  used any form of tobacco in the last 30 days? (Cigarettes, Smokeless Tobacco, Cigars, and/or Pipes): Yes  Has this patient used any form of tobacco in the last 30 days? (Cigarettes, Smokeless Tobacco, Cigars, and/or Pipes): Yes, A prescription for an FDA-approved tobacco cessation medication was offered at discharge and the patient refused  Metabolic Disorder Labs:  Lab Results  Component Value Date   HGBA1C 5.0 09/09/2015   MPG 97 09/09/2015   Lab Results  Component Value Date   PROLACTIN 8.0 09/09/2015   Lab Results  Component Value Date   CHOL 133 09/09/2015   TRIG 70 09/09/2015   HDL 35*  09/09/2015   CHOLHDL 3.8 09/09/2015   VLDL 14 09/09/2015   LDLCALC 84 09/09/2015   See Psychiatric Specialty Exam and Suicide Risk Assessment completed by Attending Physician prior to discharge.  Discharge destination:  Home  Is patient on multiple antipsychotic therapies at discharge:  No   Has Patient had three or more failed trials of antipsychotic monotherapy by history:  No  Recommended Plan for Multiple Antipsychotic Therapies: NA    Medication List    STOP taking these medications        lithium 600 MG capsule  Replaced by:  lithium carbonate 450 MG CR tablet     lithium carbonate 300 MG capsule     lurasidone 40 MG Tabs tablet  Commonly known as:  LATUDA      TAKE these medications      Indication   benztropine 0.5 MG tablet  Commonly known as:  COGENTIN  Take 1 tablet (0.5 mg total) by mouth 2 (two) times daily.   Indication:  Extrapyramidal Reaction caused by Medications     fluPHENAZine 5 MG tablet  Commonly known as:  PROLIXIN  Take 1 tablet (5 mg total) by mouth daily.   Indication:  Psychosis     fluPHENAZine 10 MG tablet  Commonly known as:  PROLIXIN  Take 1 tablet (10 mg total) by mouth at bedtime.   Indication:  Psychosis     hydrOXYzine 25 MG tablet  Commonly known as:  ATARAX/VISTARIL  Take 1 tablet (25 mg total) by mouth every 6 (six) hours as needed for anxiety.   Indication:  Anxiety Neurosis, Anxiety     lithium carbonate 450 MG CR tablet  Commonly known as:  ESKALITH  Take 1 tablet (450 mg total) by mouth 2 (two) times daily after a meal.   Indication:  Dyscontrol Syndrome, Schizoaffective Disorder     nicotine 21 mg/24hr patch  Commonly known as:  NICODERM CQ - dosed in mg/24 hours  Place 1 patch (21 mg total) onto the skin daily. For smoking cessation   Indication:  Nicotine Addiction     traZODone 50 MG tablet  Commonly known as:  DESYREL  Take 1 tablet (50 mg total) by mouth at bedtime.   Indication:  Aggressive Behavior,  Drug-Induced Difficulty in Voluntary Movement, Trouble Sleeping       Follow-up Information    Go to [facility].   Specialty:  Behavioral Health   Why:  Walk-in apt Mon-Fri 8a-12p. Please arrive as early as possible to be sure that you will be seen.   Contact information:   [place] [address] [phone]     Follow-up recommendations: Activity:  As tolerated Diet: As recommended by your primary care doctor. Keep all scheduled follow-up appointments as recommended.   Comments:  Take all your medications as prescribed by your mental  healthcare provider. Report any adverse effects and or reactions from your medicines to your outpatient provider promptly. Patient is instructed and cautioned to not engage in alcohol and or illegal drug use while on prescription medicines. In the event of worsening symptoms, patient is instructed to call the crisis hotline, 911 and or go to the nearest ED for appropriate evaluation and treatment of symptoms. Follow-up with your primary care provider for your other medical issues, concerns and or health care needs.  Signed: Benjamine Mola, FNP  11/13/2015, 12:38 PM

## 2015-11-13 NOTE — BHH Suicide Risk Assessment (Signed)
[facility] Discharge Suicide Risk Assessment   Principal Problem: Schizoaffective disorder, bipolar type [facility]) Discharge Diagnoses:  Patient Active Problem List   Diagnosis Date Noted  . Cocaine use disorder, severe, dependence ([place]) [F14.20] 11/07/2015  . Amphetamine use disorder, severe, dependence ([place]) [F15.20] 11/07/2015  . Cannabis use disorder, moderate, in sustained remission [F12.90] 11/07/2015  . Intermittent explosive disorder [F63.81] 09/05/2015  . Schizoaffective disorder, bipolar type ([place]) [F25.0] 09/04/2015  . Non compliance w medication regimen [Z91.14] 01/17/2015    Total Time spent with patient: 30 minutes  Musculoskeletal: Strength & Muscle Tone: within normal limits Gait & Station: normal Patient leans: N/A  Psychiatric Specialty Exam: ROS  Blood pressure 113/51, pulse 105, temperature 98.2 F (36.8 C), temperature source Oral, resp. rate 20, height 5\' 3"  (1.6 m), weight 226 lb 15.8 oz (102.96 kg), SpO2 99 %.Body mass index is 40.22 kg/(m^2).  General Appearance: Fairly Groomed  Engineer, water::  Good  Speech:  Normal Rate409  Volume:  Normal  Mood:  improved , states he is feeling " all right "  Affect:  Appropriate  Thought Process:  Linear  Orientation:  Other:  fully alert and attentive   Thought Content:  states hallucinations have decreased, and at this time not internally preoccupied, no bizarre or paranoid behavior, no delusions expressed   Suicidal Thoughts:  No denies any suicidal or self injurious ideations, denies any homicidal or violent ideations   Homicidal Thoughts:  No  Memory:  recent and remote grossly intact  Judgement:  Improved   Insight:  Fair  Psychomotor Activity:  Normal  Concentration:  Good  Recall:  Good  Fund of Knowledge:Good  Language: Good  Akathisia:  Negative  Handed:  Right  AIMS (if indicated):     Assets:  Desire for Improvement Resilience  Sleep:  Number of Hours: 6.75  Cognition: WNL  ADL's:  Intact   Mental  Status Per Nursing Assessment::   On Admission:  NA  Demographic Factors:  20 year old single male, lives with friends   Loss Factors: Chronic mental illness   Historical Factors: History or prior psychiatric  admissions, history of substance abuse, history of Schizoaffective  Disorder   Risk Reduction Factors:   Positive coping skills or problem solving skills  Continued Clinical Symptoms:  At this time patient is improved compared to admission - currently presents calm, cooperative, and states he is feeling " all right". At this time denies any SI, HI. Does not endorse current hallucinations and does not appear internally preoccupied. No delusions expressed at present . Future oriented. Denies medication side effects.  Cognitive Features That Contribute To Risk:  No gross cognitive deficits noted upon discharge. Is alert , attentive, and oriented x 3   Suicide Risk:  Mild:  Suicidal ideation of limited frequency, intensity, duration, and specificity.  There are no identifiable plans, no associated intent, mild dysphoria and related symptoms, good self-control (both objective and subjective assessment), few other risk factors, and identifiable protective factors, including available and accessible social support.  Follow-up Information    Go to [facility].   Specialty:  Behavioral Health   Why:  Walk-in apt Mon-Fri 8a-12p. Please arrive as early as possible to be sure that you will be seen.   Contact information:   [place] [address] [phone]       Plan Of Care/Follow-up recommendations:  Activity:  as tolerated  Diet:  Regular Tests:  NA Other:  see below  Patient is discharging  in good spirits. Neita Garnet, MD 11/13/2015, 12:47 PM

## 2015-11-13 NOTE — Progress Notes (Signed)
Patient discharged home with samples and prescriptions. Patient was stable and appreciative at that time. All papers and prescriptions were given and valuables returned. Verbal understanding expressed. Denies SI/HI and A/VH. Patient given opportunity to express concerns and ask questions.

## 2015-11-13 NOTE — Progress Notes (Signed)
  [facility] Adult Case Management Discharge Plan :  Will you be returning to the same living situation after discharge:  Yes,  home At discharge, do you have transportation home?: Yes,  bus pass Do you have the ability to pay for your medications: Yes,  mental health  Release of information consent forms completed and in the chart;  Patient's signature needed at discharge.  Patient to Follow up at: Follow-up Information    Go to [facility].   Specialty:  Behavioral Health   Why:  Walk-in apt Mon-Fri 8a-12p. Please arrive as early as possible to be sure that you will be seen.   Contact information:   [place] [address] [phone]       Next level of care provider has access to [place] and Suicide Prevention discussed: Yes,  yes  Have you used any form of tobacco in the last 30 days? (Cigarettes, Smokeless Tobacco, Cigars, and/or Pipes): Yes  Has patient been referred to the Quitline?: Pt was interested; however states he has neither an address nor a phone number  Patient has been referred for addiction treatment: Yes  Roque Lias B 11/13/2015, 11:24 AM

## 2015-11-14 ENCOUNTER — Encounter (HOSPITAL_COMMUNITY): Payer: Self-pay

## 2015-11-14 ENCOUNTER — Emergency Department (HOSPITAL_COMMUNITY)
Admission: EM | Admit: 2015-11-14 | Discharge: 2015-11-15 | Disposition: A | Discharge status: Home / Self Care | Payer: BLUE CROSS/BLUE SHIELD | Attending: Emergency Medicine | Admitting: Emergency Medicine

## 2015-11-14 DIAGNOSIS — R45851 Suicidal ideations: Secondary | ICD-10-CM | POA: Insufficient documentation

## 2015-11-14 DIAGNOSIS — F1510 Other stimulant abuse, uncomplicated: Secondary | ICD-10-CM | POA: Insufficient documentation

## 2015-11-14 DIAGNOSIS — F1520 Other stimulant dependence, uncomplicated: Secondary | ICD-10-CM | POA: Diagnosis present

## 2015-11-14 DIAGNOSIS — Z008 Encounter for other general examination: Secondary | ICD-10-CM | POA: Diagnosis present

## 2015-11-14 DIAGNOSIS — F319 Bipolar disorder, unspecified: Secondary | ICD-10-CM | POA: Insufficient documentation

## 2015-11-14 DIAGNOSIS — Z79899 Other long term (current) drug therapy: Secondary | ICD-10-CM | POA: Insufficient documentation

## 2015-11-14 DIAGNOSIS — J45909 Unspecified asthma, uncomplicated: Secondary | ICD-10-CM | POA: Diagnosis not present

## 2015-11-14 DIAGNOSIS — R441 Visual hallucinations: Secondary | ICD-10-CM | POA: Insufficient documentation

## 2015-11-14 DIAGNOSIS — Z87891 Personal history of nicotine dependence: Secondary | ICD-10-CM | POA: Diagnosis not present

## 2015-11-14 DIAGNOSIS — R443 Hallucinations, unspecified: Secondary | ICD-10-CM

## 2015-11-14 DIAGNOSIS — Z880 Allergy status to penicillin: Secondary | ICD-10-CM | POA: Insufficient documentation

## 2015-11-14 DIAGNOSIS — R440 Auditory hallucinations: Secondary | ICD-10-CM | POA: Diagnosis not present

## 2015-11-14 DIAGNOSIS — R45850 Homicidal ideations: Secondary | ICD-10-CM | POA: Diagnosis not present

## 2015-11-14 NOTE — ED Notes (Signed)
Pt BIB [facility] reports visual hallucinations and SI w/ no plan. According to [facility] pt states he ingested Crystal Meth and x1 40oz. Pt arrives ambulatory, A+OX4.

## 2015-11-15 DIAGNOSIS — R443 Hallucinations, unspecified: Secondary | ICD-10-CM

## 2015-11-15 DIAGNOSIS — F1520 Other stimulant dependence, uncomplicated: Secondary | ICD-10-CM | POA: Diagnosis not present

## 2015-11-15 DIAGNOSIS — R45850 Homicidal ideations: Secondary | ICD-10-CM | POA: Insufficient documentation

## 2015-11-15 DIAGNOSIS — R45851 Suicidal ideations: Secondary | ICD-10-CM | POA: Insufficient documentation

## 2015-11-15 LAB — COMPREHENSIVE METABOLIC PANEL
ALT: 36 U/L (ref 17–63)
AST: 49 U/L — ABNORMAL HIGH (ref 15–41)
Albumin: 4.6 g/dL (ref 3.5–5.0)
Alkaline Phosphatase: 72 U/L (ref 38–126)
Anion gap: 14 (ref 5–15)
BUN: 10 mg/dL (ref 6–20)
CO2: 21 mmol/L — ABNORMAL LOW (ref 22–32)
Calcium: 9.1 mg/dL (ref 8.9–10.3)
Chloride: 102 mmol/L (ref 101–111)
Creatinine, Ser: 0.85 mg/dL (ref 0.61–1.24)
GFR calc Af Amer: >60 mL/min (ref >60)
GFR calc non Af Amer: >60 mL/min (ref >60)
Glucose, Bld: 106 mg/dL — ABNORMAL HIGH (ref 65–99)
Potassium: 3 mmol/L — ABNORMAL LOW (ref 3.5–5.1)
Sodium: 137 mmol/L (ref 135–145)
Total Bilirubin: 0.9 mg/dL (ref 0.3–1.2)
Total Protein: 7.7 g/dL (ref 6.5–8.1)

## 2015-11-15 LAB — ACETAMINOPHEN LEVEL: Acetaminophen (Tylenol), Serum: <10 ug/mL — ABNORMAL LOW (ref 10–30)

## 2015-11-15 LAB — LITHIUM LEVEL: Lithium Lvl: <0.06 mmol/L — ABNORMAL LOW (ref 0.60–1.20)

## 2015-11-15 LAB — CBC
HCT: 44.6 % (ref 39.0–52.0)
Hemoglobin: 15.2 g/dL (ref 13.0–17.0)
MCH: 28.1 pg (ref 26.0–34.0)
MCHC: 34.1 g/dL (ref 30.0–36.0)
MCV: 82.4 fL (ref 78.0–100.0)
Platelets: 249 10*3/uL (ref 150–400)
RBC: 5.41 MIL/uL (ref 4.22–5.81)
RDW: 12.4 % (ref 11.5–15.5)
WBC: 10.7 10*3/uL — ABNORMAL HIGH (ref 4.0–10.5)

## 2015-11-15 LAB — ETHANOL: Alcohol, Ethyl (B): 84 mg/dL — ABNORMAL HIGH (ref <5)

## 2015-11-15 LAB — RAPID URINE DRUG SCREEN, HOSP PERFORMED
Amphetamines: POSITIVE — AB
Barbiturates: NOT DETECTED
Benzodiazepines: NOT DETECTED
Cocaine: NOT DETECTED
Opiates: NOT DETECTED
Tetrahydrocannabinol: NOT DETECTED

## 2015-11-15 LAB — SALICYLATE LEVEL: Salicylate Lvl: <4 mg/dL (ref 2.8–30.0)

## 2015-11-15 MED ORDER — TRAZODONE HCL 50 MG PO TABS
50.0000 mg | ORAL_TABLET | Freq: Every day | ORAL | Status: DC
  Administered 2015-11-15: 50 mg via ORAL
  Filled 2015-11-15: qty 1

## 2015-11-15 MED ORDER — POTASSIUM CHLORIDE CRYS ER 20 MEQ PO TBCR
40.0000 meq | EXTENDED_RELEASE_TABLET | Freq: Once | ORAL | Status: AC
  Administered 2015-11-15: 40 meq via ORAL
  Filled 2015-11-15: qty 2

## 2015-11-15 MED ORDER — FLUPHENAZINE HCL 10 MG PO TABS
10.0000 mg | ORAL_TABLET | Freq: Every day | ORAL | Status: DC
  Filled 2015-11-15: qty 1

## 2015-11-15 MED ORDER — NICOTINE 21 MG/24HR TD PT24
21.0000 mg | MEDICATED_PATCH | Freq: Every day | TRANSDERMAL | Status: DC
  Filled 2015-11-15: qty 1

## 2015-11-15 MED ORDER — LITHIUM CARBONATE ER 450 MG PO TBCR
450.0000 mg | EXTENDED_RELEASE_TABLET | Freq: Two times a day (BID) | ORAL | Status: DC
  Administered 2015-11-15: 450 mg via ORAL
  Filled 2015-11-15 (×3): qty 1

## 2015-11-15 MED ORDER — HYDROXYZINE HCL 25 MG PO TABS: 25.0000 mg | ORAL_TABLET | Freq: Four times a day (QID) | ORAL | Status: DC | PRN

## 2015-11-15 MED ORDER — FLUPHENAZINE HCL 5 MG PO TABS
5.0000 mg | ORAL_TABLET | Freq: Every day | ORAL | Status: DC
  Administered 2015-11-15: 5 mg via ORAL
  Filled 2015-11-15: qty 1

## 2015-11-15 MED ORDER — BENZTROPINE MESYLATE 1 MG PO TABS
0.5000 mg | ORAL_TABLET | Freq: Two times a day (BID) | ORAL | Status: DC
  Administered 2015-11-15 (×2): 0.5 mg via ORAL
  Filled 2015-11-15 (×2): qty 1

## 2015-11-15 MED ORDER — GI COCKTAIL ~~LOC~~
30.0000 mL | Freq: Once | ORAL | Status: AC
  Administered 2015-11-15: 30 mL via ORAL
  Filled 2015-11-15: qty 30

## 2015-11-15 NOTE — ED Notes (Signed)
Pt requesting med for heartburn, Dr. Leonides Schanz notified

## 2015-11-15 NOTE — ED Provider Notes (Signed)
By signing my name below, I, Randa Evens, attest that this documentation has been prepared under the direction and in the presence of [employer], DO. Electronically Signed: Randa Evens, ED Scribe. 11/15/2015. 12:28 AM.  TIME SEEN: 12:28 AM  CHIEF COMPLAINT: SI  HPI: HPI Comments: Justin Chaney is a 20 y.o. male with history of bipolar disorder, schizoaffective disorder, seizures who presents to the Emergency Department complaining of SI onset tonight. Pt states that he did try to commit suicide tonight. Pt states that he attempted to slice his throat tonight with a dagger. States he did not follow through and he has no lacerations. Pt reports that he is having auditory and visual hallucinations. Pt states that he is seeing aliens and demons that are not from this world. Pt states that he is also hearing ancient languages that are telling him to harm others. Pt states that he has no drive to live anymore. Pt does report ETOH use tonight. Pt also admits to methamphetamine use. Denies any current medical complaints. No pain, cough, fever, vomiting, diarrhea.   ROS: See HPI Constitutional: no fever  Eyes: no drainage  ENT: no runny nose   Cardiovascular:  no chest pain  Resp: no SOB  GI: no vomiting GU: no dysuria Integumentary: no rash  Allergy: no hives  Musculoskeletal: no leg swelling  Neurological: no slurred speech ROS otherwise negative  PAST MEDICAL HISTORY/PAST SURGICAL HISTORY:  Past Medical History  Diagnosis Date  . Asthma   . Seizures ([place])   . Brain ventricular shunt obstruction     hydrochelpis  . Depression   . Bipolar disorder ([place])     MEDICATIONS:  Prior to Admission medications   Medication Sig Start Date End Date Taking? Authorizing Provider  benztropine (COGENTIN) 0.5 MG tablet Take 1 tablet (0.5 mg total) by mouth 2 (two) times daily. 11/13/15  Yes Benjamine Mola, FNP  fluPHENAZine (PROLIXIN) 10 MG tablet Take 1 tablet (10 mg total) by mouth at  bedtime. 11/13/15  Yes Benjamine Mola, FNP  fluPHENAZine (PROLIXIN) 5 MG tablet Take 1 tablet (5 mg total) by mouth daily. 11/13/15  Yes Benjamine Mola, FNP  hydrOXYzine (ATARAX/VISTARIL) 25 MG tablet Take 1 tablet (25 mg total) by mouth every 6 (six) hours as needed for anxiety. 11/13/15  Yes Benjamine Mola, FNP  lithium carbonate (ESKALITH) 450 MG CR tablet Take 1 tablet (450 mg total) by mouth 2 (two) times daily after a meal. 11/13/15  Yes Benjamine Mola, FNP  traZODone (DESYREL) 50 MG tablet Take 1 tablet (50 mg total) by mouth at bedtime. 11/13/15  Yes Benjamine Mola, FNP  nicotine (NICODERM CQ - DOSED IN MG/24 HOURS) 21 mg/24hr patch Place 1 patch (21 mg total) onto the skin daily. For smoking cessation 11/13/15   Benjamine Mola, FNP    ALLERGIES:  Allergies  Allergen Reactions  . Amoxicillin Hives  . Penicillins Hives and Rash    Has patient had a PCN reaction causing immediate rash, facial/tongue/throat swelling, SOB or lightheadedness with hypotension: unknown Has patient had a PCN reaction causing severe rash involving mucus membranes or skin necrosis: Yes Has patient had a PCN reaction that required hospitalization :unknown Has patient had a PCN reaction occurring within the last 10 years: unknown If all of the above answers are "NO", then may proceed with Cephalosporin use.     SOCIAL HISTORY:  Social History  Substance Use Topics  . Smoking status: Former Smoker -- 1.00 packs/day  Types: Cigarettes    Quit date: 10/22/2015  . Smokeless tobacco: Not on file  . Alcohol Use: 7.2 oz/week    12 Cans of beer per week     Comment: states he no longer drinks     FAMILY HISTORY: Family History  Problem Relation Age of Onset  . Hypertension Mother   . Mental illness Neg Hx     EXAM: BP 123/62 mmHg  Pulse 92  Temp(Src) 98.3 F (36.8 C) (Oral)  Resp 20  SpO2 95%   CONSTITUTIONAL: Alert and oriented and responds appropriately to questions. Well-appearing;  well-nourished HEAD: Normocephalic EYES: Conjunctivae clear, PERRL ENT: normal nose; no rhinorrhea; moist mucous membranes NECK: Supple, no meningismus, no LAD  CARD: RRR; S1 and S2 appreciated; no murmurs, no clicks, no rubs, no gallops RESP: Normal chest excursion without splinting or tachypnea; breath sounds clear and equal bilaterally; no wheezes, no rhonchi, no rales, no hypoxia or respiratory distress, speaking full sentences ABD/GI: Normal bowel sounds; non-distended; soft, non-tender, no rebound, no guarding, no peritoneal signs BACK:  The back appears normal and is non-tender to palpation, there is no CVA tenderness EXT: Normal ROM in all joints; non-tender to palpation; no edema; normal capillary refill; no cyanosis, no calf tenderness or swelling    SKIN: Normal color for age and race; warm; no rash NEURO: Moves all extremities equally, sensation to light touch intact diffusely, cranial nerves II through XII intact PSYCH: He endorses SI with plan to cut himself with a dagger. Endorses HI but not towards anyone particular, endorses visual hallucination and auditory hallucinations   MEDICAL DECISION MAKING: Patient here with complaints of SI with plan to stab himself. Also has HI, hallucinations. No current medical complaints. Hemodynamically stable. Screening labs show potassium of 3.0, will replace. Alcohol is 84. Urine drug screen positive for amphetamines. I feel he is medically cleared. Awaiting psychiatric evaluation for disposition.  ED PROGRESS: 1:40 AM  D/w TTS.  Serena Colonel would like patient to be reassessed by psychiatry in the morning for further disposition.     I personally performed the services described in this documentation, which was scribed in my presence. The recorded information has been reviewed and is accurate.   [place], DO 11/15/15 [phone]

## 2015-11-15 NOTE — Consult Note (Signed)
[facility] Face-to-Face Psychiatry Consult   Reason for Consult:  Agitation,  Referring Physician:  EDP Patient Identification: Justin Chaney MRN:  [id number] Principal Diagnosis: Amphetamine use disorder, severe, dependence ([place]) Diagnosis:   Patient Active Problem List   Diagnosis Date Noted  . Cocaine use disorder, severe, dependence ([place]) [F14.20] 11/07/2015  . Amphetamine use disorder, severe, dependence ([place]) [F15.20] 11/07/2015  . Cannabis use disorder, moderate, in sustained remission [F12.90] 11/07/2015  . Intermittent explosive disorder [F63.81] 09/05/2015  . Schizoaffective disorder, bipolar type ([place]) [F25.0] 09/04/2015  . Non compliance w medication regimen [Z91.14] 01/17/2015    Total Time spent with patient: 45 minutes  Subjective:   Justin Chaney is a 20 y.o. male patient admitted with Agitation.  HPI:  AA male, 20 years old was seen for suicidal ideation after using Methamphetamine.  Patient admits to using Methamphetamine last night and started feeling suicidal.  He admits that he wanted a place to stay over night and lied about feeling suicidal and Homicidal.  He was discharged from [facility] a week ago after few days of stabilization..  Patient was laughing when asked about his report that he tried to slice his throat.  Patient stated that he love life and that his behavior was due to  Methamphetamine use.  Patient states he takes his medications as prescribed.  Patient reports that he is sleeping well and he plans not to use Methamphetamine again.  Patient denies SI/HI/AVH.  He is discharged home.  Past Psychiatric History:  Schizoaffective disorder, Bipolar type, Intermittent explosive disorder, Cocaine use disorder, Cannabis use disorder  Risk to Self: Suicidal Ideation: Yes-Currently Present Suicidal Intent: Yes-Currently Present Is patient at risk for suicide?: Yes Suicidal Plan?: Yes-Currently Present Specify Current Suicidal Plan: Pt reports attempt to cut throat  with dagger pta Access to Means: Yes Specify Access to Suicidal Means: Access to weapons and sharp objects What has been your use of drugs/alcohol within the last 12 months?: Pt reports use of Crystal Meth pta How many times?: 10 Other Self Harm Risks: h/o suicide attempt, psychosis Triggers for Past Attempts: Unpredictable Intentional Self Injurious Behavior: Cutting, Burning, Bruising (Pt ) Comment - Self Injurious Behavior: Pt reported no additional information Risk to Others: Homicidal Ideation: Yes-Currently Present Thoughts of Harm to Others: Yes-Currently Present Comment - Thoughts of Harm to Others: Pt reports current thoughts of harming others-no additional information provided Current Homicidal Intent: Yes-Currently Present Current Homicidal Plan: Yes-Currently Present Describe Current Homicidal Plan: Pt reports HI plans however, would not provide additional information Access to Homicidal Means: Yes (Pt reprots access to weapons) Describe Access to Homicidal Means: Pt reports access to weapons Identified Victim: Pt reported no specific identified victim History of harm to others?: Yes (Pt states has hurt others in past-no additional provided) Assessment of Violence: None Noted Does patient have access to weapons?: Yes (Comment) (Pt reports access to knives and swords) Criminal Charges Pending?: No Does patient have a court date: No Prior Inpatient Therapy: Prior Inpatient Therapy: Yes Prior Therapy Dates: Multiple admission, last admitted 4/17  @ [facility] Prior Therapy Facilty/Provider(s): [facility], [place], [place], [facility]  Reason for Treatment: Bipolar, Depression, Psychosis, SA Prior Outpatient Therapy: Prior Outpatient Therapy: Yes Prior Therapy Dates: Ongoing Prior Therapy Facilty/Provider(s): [facility] Reason for Treatment: Schizophrenia Does patient have an ACCT team?: No Does patient have Intensive In-House Services?  : No Does patient have [facility] services? : Yes Does patient  have P4CC services?: No  Past Medical History:  Past Medical History  Diagnosis Date  .  Asthma   . Seizures ([place])   . Brain ventricular shunt obstruction     hydrochelpis  . Depression   . Bipolar disorder [facility])     Past Surgical History  Procedure Laterality Date  . Tonsillectomy    . Ventriculo-peritoneal shunt placement / laparoscopic insertion peritoneal catheter     Family History:  Family History  Problem Relation Age of Onset  . Hypertension Mother   . Mental illness Neg Hx    Family Psychiatric  History: denies Social History:  History  Alcohol Use  . 7.2 oz/week  . 12 Cans of beer per week    Comment: states he no longer drinks      History  Drug Use  . Yes  . Special: Marijuana, Other-see comments, Amphetamines    Comment: Adderall, "I don't do weed, only rarely"    Social History   Social History  . Marital Status: Single    Spouse Name: N/A  . Number of Children: N/A  . Years of Education: N/A   Social History Main Topics  . Smoking status: Former Smoker -- 1.00 packs/day    Types: Cigarettes    Quit date: 10/22/2015  . Smokeless tobacco: None  . Alcohol Use: 7.2 oz/week    12 Cans of beer per week     Comment: states he no longer drinks   . Drug Use: Yes    Special: Marijuana, Other-see comments, Amphetamines     Comment: Adderall, "I don't do weed, only rarely"  . Sexual Activity: Not Asked   Other Topics Concern  . None   Social History Narrative   Additional Social History:    Allergies:   Allergies  Allergen Reactions  . Amoxicillin Hives  . Penicillins Hives and Rash    Has patient had a PCN reaction causing immediate rash, facial/tongue/throat swelling, SOB or lightheadedness with hypotension: unknown Has patient had a PCN reaction causing severe rash involving mucus membranes or skin necrosis: Yes Has patient had a PCN reaction that required hospitalization :unknown Has patient had a PCN reaction occurring within the last  10 years: unknown If all of the above answers are "NO", then may proceed with Cephalosporin use.     Labs:  Results for orders placed or performed during the hospital encounter of 11/14/15 (from the past 48 hour(s))  Comprehensive metabolic panel     Status: Abnormal   Collection Time: 11/14/15 11:57 PM  Result Value Ref Range   Sodium 137 135 - 145 mmol/L   Potassium 3.0 (L) 3.5 - 5.1 mmol/L   Chloride 102 101 - 111 mmol/L   CO2 21 (L) 22 - 32 mmol/L   Glucose, Bld 106 (H) 65 - 99 mg/dL   BUN 10 6 - 20 mg/dL   Creatinine, Ser 0.85 0.61 - 1.24 mg/dL   Calcium 9.1 8.9 - 10.3 mg/dL   Total Protein 7.7 6.5 - 8.1 g/dL   Albumin 4.6 3.5 - 5.0 g/dL   AST 49 (H) 15 - 41 U/L   ALT 36 17 - 63 U/L   Alkaline Phosphatase 72 38 - 126 U/L   Total Bilirubin 0.9 0.3 - 1.2 mg/dL   GFR calc non Af Amer >60 >60 mL/min   GFR calc Af Amer >60 >60 mL/min    Comment: (NOTE) The eGFR has been calculated using the CKD EPI equation. This calculation has not been validated in all clinical situations. eGFR's persistently <60 mL/min signify possible Chronic Kidney Disease.    Anion  gap 14 5 - 15  Ethanol (ETOH)     Status: Abnormal   Collection Time: 11/14/15 11:57 PM  Result Value Ref Range   Alcohol, Ethyl (B) 84 (H) <5 mg/dL    Comment:        LOWEST DETECTABLE LIMIT FOR SERUM ALCOHOL IS 5 mg/dL FOR MEDICAL PURPOSES ONLY   Salicylate level     Status: None   Collection Time: 11/14/15 11:57 PM  Result Value Ref Range   Salicylate Lvl <9.6 2.8 - 30.0 mg/dL  Acetaminophen level     Status: Abnormal   Collection Time: 11/14/15 11:57 PM  Result Value Ref Range   Acetaminophen (Tylenol), Serum <10 (L) 10 - 30 ug/mL    Comment:        THERAPEUTIC CONCENTRATIONS VARY SIGNIFICANTLY. A RANGE OF 10-30 ug/mL MAY BE AN EFFECTIVE CONCENTRATION FOR MANY PATIENTS. HOWEVER, SOME ARE BEST TREATED AT CONCENTRATIONS OUTSIDE THIS RANGE. ACETAMINOPHEN CONCENTRATIONS >150 ug/mL AT 4 HOURS AFTER INGESTION  AND >50 ug/mL AT 12 HOURS AFTER INGESTION ARE OFTEN ASSOCIATED WITH TOXIC REACTIONS.   CBC     Status: Abnormal   Collection Time: 11/14/15 11:57 PM  Result Value Ref Range   WBC 10.7 (H) 4.0 - 10.5 K/uL   RBC 5.41 4.22 - 5.81 MIL/uL   Hemoglobin 15.2 13.0 - 17.0 g/dL   HCT 44.6 39.0 - 52.0 %   MCV 82.4 78.0 - 100.0 fL   MCH 28.1 26.0 - 34.0 pg   MCHC 34.1 30.0 - 36.0 g/dL   RDW 12.4 11.5 - 15.5 %   Platelets 249 150 - 400 K/uL  Urine rapid drug screen (hosp performed) (Not at [facility])     Status: Abnormal   Collection Time: 11/14/15 11:57 PM  Result Value Ref Range   Opiates NONE DETECTED NONE DETECTED   Cocaine NONE DETECTED NONE DETECTED   Benzodiazepines NONE DETECTED NONE DETECTED   Amphetamines POSITIVE (A) NONE DETECTED   Tetrahydrocannabinol NONE DETECTED NONE DETECTED   Barbiturates NONE DETECTED NONE DETECTED    Comment:        DRUG SCREEN FOR MEDICAL PURPOSES ONLY.  IF CONFIRMATION IS NEEDED FOR ANY PURPOSE, NOTIFY LAB WITHIN 5 DAYS.        LOWEST DETECTABLE LIMITS FOR URINE DRUG SCREEN Drug Class       Cutoff (ng/mL) Amphetamine      1000 Barbiturate      200 Benzodiazepine   295 Tricyclics       284 Opiates          300 Cocaine          300 THC              50   Lithium level     Status: Abnormal   Collection Time: 11/14/15 11:57 PM  Result Value Ref Range   Lithium Lvl <0.06 (L) 0.60 - 1.20 mmol/L    Current Facility-Administered Medications  Medication Dose Route Frequency Provider Last Rate Last Dose  . benztropine (COGENTIN) tablet 0.5 mg  0.5 mg Oral BID Kristen N Ward, DO   0.5 mg at 11/15/15 0920  . fluPHENAZine (PROLIXIN) tablet 10 mg  10 mg Oral QHS Kristen N Ward, DO      . fluPHENAZine (PROLIXIN) tablet 5 mg  5 mg Oral Daily Kristen N Ward, DO   5 mg at 11/15/15 0920  . hydrOXYzine (ATARAX/VISTARIL) tablet 25 mg  25 mg Oral Q6H PRN Kristen N Ward, DO      .  lithium carbonate (ESKALITH) CR tablet 450 mg  450 mg Oral BID PC Kristen N Ward, DO    450 mg at 11/15/15 0837  . nicotine (NICODERM CQ - dosed in mg/24 hours) patch 21 mg  21 mg Transdermal Daily Kristen N Ward, DO   21 mg at 11/15/15 2909  . traZODone (DESYREL) tablet 50 mg  50 mg Oral QHS Kristen N Ward, DO   50 mg at 11/15/15 0134   Current Outpatient Prescriptions  Medication Sig Dispense Refill  . benztropine (COGENTIN) 0.5 MG tablet Take 1 tablet (0.5 mg total) by mouth 2 (two) times daily. 60 tablet 0  . fluPHENAZine (PROLIXIN) 10 MG tablet Take 1 tablet (10 mg total) by mouth at bedtime. 30 tablet 0  . fluPHENAZine (PROLIXIN) 5 MG tablet Take 1 tablet (5 mg total) by mouth daily. 30 tablet 0  . hydrOXYzine (ATARAX/VISTARIL) 25 MG tablet Take 1 tablet (25 mg total) by mouth every 6 (six) hours as needed for anxiety. 30 tablet 0  . lithium carbonate (ESKALITH) 450 MG CR tablet Take 1 tablet (450 mg total) by mouth 2 (two) times daily after a meal. 60 tablet 0  . traZODone (DESYREL) 50 MG tablet Take 1 tablet (50 mg total) by mouth at bedtime. 30 tablet 0  . nicotine (NICODERM CQ - DOSED IN MG/24 HOURS) 21 mg/24hr patch Place 1 patch (21 mg total) onto the skin daily. For smoking cessation 28 patch 0    Musculoskeletal: Strength & Muscle Tone: within normal limits Gait & Station: normal Patient leans: N/A  Psychiatric Specialty Exam: Review of Systems  Constitutional: Negative.   HENT: Negative.   Eyes: Negative.   Cardiovascular: Negative.   Genitourinary: Negative.   Skin: Negative.   Neurological: Negative.   Endo/Heme/Allergies: Negative.     Blood pressure 114/73, pulse 93, temperature 97.9 F (36.6 C), temperature source Oral, resp. rate 18, SpO2 100 %.There is no weight on file to calculate BMI.  General Appearance: Casual  Eye Contact::  Good  Speech:  Clear and Coherent and Normal Rate  Volume:  Normal  Mood:  Euthymic  Affect:  Congruent  Thought Process:  Coherent, Goal Directed and Intact  Orientation:  Full (Time, Place, and Person)  Thought  Content:  WDL  Suicidal Thoughts:  No  Homicidal Thoughts:  No  Memory:  Immediate;   Good Recent;   Good Remote;   Good  Judgement:  Fair  Insight:  Shallow  Psychomotor Activity:  Normal  Concentration:  Good  Recall:  NA  Fund of Knowledge:Good  Language: Good  Akathisia:  NA  Handed:  Right  AIMS (if indicated):     Assets:  Desire for Improvement  ADL's:  Intact  Cognition: WNL  Sleep:      Disposition:  Discharge home, follow up with your outpatient provider and continue taking your medications.  Delfin Gant, NP   PMHNP-BC 11/15/2015 12:29 PM Patient seen face-to-face for psychiatric evaluation, chart reviewed and case discussed with the physician extender and developed treatment plan. Reviewed the information documented and agree with the treatment plan. Corena Pilgrim, MD

## 2015-11-15 NOTE — [facility] Suicide Risk Assessment (Cosign Needed)
Suicide Risk Assessment  Discharge Assessment   [facility] Discharge Suicide Risk Assessment   Principal Problem: Amphetamine use disorder, severe, dependence [facility]) Discharge Diagnoses:  Patient Active Problem List   Diagnosis Date Noted  . Hallucinations [R44.3]   . Homicidal ideation [R45.850]   . Suicidal thoughts [R45.851]   . Cocaine use disorder, severe, dependence ([place]) [F14.20] 11/07/2015  . Amphetamine use disorder, severe, dependence ([place]) [F15.20] 11/07/2015  . Cannabis use disorder, moderate, in sustained remission [F12.90] 11/07/2015  . Intermittent explosive disorder [F63.81] 09/05/2015  . Schizoaffective disorder, bipolar type ([place]) [F25.0] 09/04/2015  . Non compliance w medication regimen [Z91.14] 01/17/2015    Total Time spent with patient: 20 minutes  Musculoskeletal: Strength & Muscle Tone: within normal limits Gait & Station: normal Patient leans: N/A  Psychiatric Specialty Exam:   Blood pressure 114/73, pulse 93, temperature 97.9 F (36.6 C), temperature source Oral, resp. rate 18, SpO2 100 %.There is no weight on file to calculate BMI.  General Appearance: Casual  Eye Contact:: Good  Speech: Clear and Coherent and Normal Rate  Volume: Normal  Mood: Euthymic  Affect: Congruent  Thought Process: Coherent, Goal Directed and Intact  Orientation: Full (Time, Place, and Person)  Thought Content: WDL  Suicidal Thoughts: No  Homicidal Thoughts: No  Memory: Immediate; Good Recent; Good Remote; Good  Judgement: Fair  Insight: Shallow  Psychomotor Activity: Normal  Concentration: Good  Recall: NA  Fund of Knowledge:Good  Language: Good  Akathisia: NA  Handed: Right  AIMS (if indicated):    Assets: Desire for Improvement  ADL's: Intact  Cognition: WNL         Mental Status Per Nursing Assessment::   On Admission:     Demographic Factors:  Male, Low socioeconomic status, Living alone and  Unemployed  Loss Factors: NA  Historical Factors: NA  Risk Reduction Factors:   Religious beliefs about death  Continued Clinical Symptoms:  Bipolar Disorder:   Depressive phase Alcohol/Substance Abuse/Dependencies  Cognitive Features That Contribute To Risk:  Polarized thinking    Suicide Risk:  Minimal: No identifiable suicidal ideation.  Patients presenting with no risk factors but with morbid ruminations; may be classified as minimal risk based on the severity of the depressive symptoms    Plan Of Care/Follow-up recommendations:  Activity:  as tolerated Diet:  regular  Delfin Gant, NP     PMHNP-BC 11/15/2015, 12:52 PM
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
## 2015-11-15 NOTE — ED Notes (Signed)
Patient being discharged to home  Bus pass given to patient.

## 2015-11-16 ENCOUNTER — Encounter (HOSPITAL_COMMUNITY): Payer: Self-pay | Admitting: Emergency Medicine

## 2015-11-16 ENCOUNTER — Emergency Department (EMERGENCY_DEPARTMENT_HOSPITAL)
Admission: EM | Admit: 2015-11-16 | Discharge: 2015-11-18 | Disposition: A | Discharge status: Other Acute Inpatient Hospital | Payer: BLUE CROSS/BLUE SHIELD | Source: Home / Self Care | Attending: Emergency Medicine | Admitting: Emergency Medicine

## 2015-11-16 DIAGNOSIS — J45909 Unspecified asthma, uncomplicated: Secondary | ICD-10-CM | POA: Diagnosis not present

## 2015-11-16 DIAGNOSIS — Z79899 Other long term (current) drug therapy: Secondary | ICD-10-CM

## 2015-11-16 DIAGNOSIS — R45851 Suicidal ideations: Secondary | ICD-10-CM | POA: Diagnosis not present

## 2015-11-16 DIAGNOSIS — T1491XA Suicide attempt, initial encounter: Secondary | ICD-10-CM

## 2015-11-16 DIAGNOSIS — F6381 Intermittent explosive disorder: Secondary | ICD-10-CM | POA: Insufficient documentation

## 2015-11-16 DIAGNOSIS — T1491 Suicide attempt: Secondary | ICD-10-CM

## 2015-11-16 DIAGNOSIS — R45850 Homicidal ideations: Secondary | ICD-10-CM

## 2015-11-16 DIAGNOSIS — F209 Schizophrenia, unspecified: Secondary | ICD-10-CM

## 2015-11-16 DIAGNOSIS — Z87891 Personal history of nicotine dependence: Secondary | ICD-10-CM | POA: Diagnosis not present

## 2015-11-16 DIAGNOSIS — F319 Bipolar disorder, unspecified: Secondary | ICD-10-CM

## 2015-11-16 DIAGNOSIS — F1420 Cocaine dependence, uncomplicated: Secondary | ICD-10-CM | POA: Insufficient documentation

## 2015-11-16 DIAGNOSIS — Z880 Allergy status to penicillin: Secondary | ICD-10-CM

## 2015-11-16 DIAGNOSIS — R443 Hallucinations, unspecified: Secondary | ICD-10-CM

## 2015-11-16 DIAGNOSIS — F250 Schizoaffective disorder, bipolar type: Principal | ICD-10-CM | POA: Insufficient documentation

## 2015-11-16 DIAGNOSIS — F1520 Other stimulant dependence, uncomplicated: Secondary | ICD-10-CM | POA: Diagnosis not present

## 2015-11-16 DIAGNOSIS — F1310 Sedative, hypnotic or anxiolytic abuse, uncomplicated: Secondary | ICD-10-CM | POA: Insufficient documentation

## 2015-11-16 DIAGNOSIS — Z982 Presence of cerebrospinal fluid drainage device: Secondary | ICD-10-CM

## 2015-11-16 DIAGNOSIS — F1290 Cannabis use, unspecified, uncomplicated: Secondary | ICD-10-CM | POA: Insufficient documentation

## 2015-11-16 DIAGNOSIS — Z9119 Patient's noncompliance with other medical treatment and regimen: Secondary | ICD-10-CM | POA: Diagnosis not present

## 2015-11-16 DIAGNOSIS — Z590 Homelessness: Secondary | ICD-10-CM

## 2015-11-16 DIAGNOSIS — F1510 Other stimulant abuse, uncomplicated: Secondary | ICD-10-CM | POA: Insufficient documentation

## 2015-11-16 HISTORY — DX: Homelessness unspecified: Z59.00

## 2015-11-16 HISTORY — DX: Homelessness: Z59.0

## 2015-11-16 HISTORY — DX: Schizophrenia, unspecified: F20.9

## 2015-11-16 LAB — COMPREHENSIVE METABOLIC PANEL
ALT: 38 U/L (ref 17–63)
AST: 38 U/L (ref 15–41)
Albumin: 3.9 g/dL (ref 3.5–5.0)
Alkaline Phosphatase: 74 U/L (ref 38–126)
Anion gap: 11 (ref 5–15)
BUN: 12 mg/dL (ref 6–20)
CO2: 22 mmol/L (ref 22–32)
Calcium: 9.1 mg/dL (ref 8.9–10.3)
Chloride: 106 mmol/L (ref 101–111)
Creatinine, Ser: 1.01 mg/dL (ref 0.61–1.24)
GFR calc Af Amer: >60 mL/min (ref >60)
GFR calc non Af Amer: >60 mL/min (ref >60)
Glucose, Bld: 97 mg/dL (ref 65–99)
Potassium: 3.4 mmol/L — ABNORMAL LOW (ref 3.5–5.1)
Sodium: 139 mmol/L (ref 135–145)
Total Bilirubin: 1 mg/dL (ref 0.3–1.2)
Total Protein: 6.8 g/dL (ref 6.5–8.1)

## 2015-11-16 LAB — RAPID URINE DRUG SCREEN, HOSP PERFORMED
Amphetamines: POSITIVE — AB
Barbiturates: POSITIVE — AB
Benzodiazepines: NOT DETECTED
Cocaine: NOT DETECTED
Opiates: NOT DETECTED
Tetrahydrocannabinol: NOT DETECTED

## 2015-11-16 LAB — CBC
HCT: 42.7 % (ref 39.0–52.0)
Hemoglobin: 14.2 g/dL (ref 13.0–17.0)
MCH: 27.4 pg (ref 26.0–34.0)
MCHC: 33.3 g/dL (ref 30.0–36.0)
MCV: 82.3 fL (ref 78.0–100.0)
Platelets: 227 10*3/uL (ref 150–400)
RBC: 5.19 MIL/uL (ref 4.22–5.81)
RDW: 11.9 % (ref 11.5–15.5)
WBC: 11.2 10*3/uL — ABNORMAL HIGH (ref 4.0–10.5)

## 2015-11-16 LAB — ACETAMINOPHEN LEVEL: Acetaminophen (Tylenol), Serum: <10 ug/mL — ABNORMAL LOW (ref 10–30)

## 2015-11-16 LAB — ETHANOL: Alcohol, Ethyl (B): <5 mg/dL (ref <5)

## 2015-11-16 LAB — SALICYLATE LEVEL: Salicylate Lvl: <4 mg/dL (ref 2.8–30.0)

## 2015-11-16 MED ORDER — ACETAMINOPHEN 325 MG PO TABS: 650.0000 mg | ORAL_TABLET | ORAL | Status: DC | PRN

## 2015-11-16 MED ORDER — FLUPHENAZINE HCL 5 MG PO TABS
5.0000 mg | ORAL_TABLET | Freq: Every day | ORAL | Status: DC
  Administered 2015-11-17: 5 mg via ORAL
  Filled 2015-11-16 (×2): qty 1

## 2015-11-16 MED ORDER — BENZTROPINE MESYLATE 1 MG PO TABS
0.5000 mg | ORAL_TABLET | Freq: Two times a day (BID) | ORAL | Status: DC
  Administered 2015-11-16 – 2015-11-17 (×3): 0.5 mg via ORAL
  Filled 2015-11-16 (×3): qty 1

## 2015-11-16 MED ORDER — LITHIUM CARBONATE ER 450 MG PO TBCR
450.0000 mg | EXTENDED_RELEASE_TABLET | Freq: Two times a day (BID) | ORAL | Status: DC
  Administered 2015-11-17 (×2): 450 mg via ORAL
  Filled 2015-11-16 (×4): qty 1

## 2015-11-16 MED ORDER — TRAZODONE HCL 50 MG PO TABS: 50.0000 mg | ORAL_TABLET | Freq: Every evening | ORAL | Status: DC | PRN

## 2015-11-16 MED ORDER — NICOTINE 21 MG/24HR TD PT24
21.0000 mg | MEDICATED_PATCH | Freq: Every day | TRANSDERMAL | Status: DC
  Filled 2015-11-16: qty 1

## 2015-11-16 MED ORDER — FLUPHENAZINE HCL 10 MG PO TABS
10.0000 mg | ORAL_TABLET | Freq: Every day | ORAL | Status: DC
  Administered 2015-11-16 – 2015-11-17 (×2): 10 mg via ORAL
  Filled 2015-11-16 (×3): qty 1

## 2015-11-16 NOTE — ED Notes (Signed)
Staffing office / charge nurse notified for pt.'s sitter , purple scrubs given to pt. , security notified to wand pt.

## 2015-11-16 NOTE — ED Notes (Signed)
Pt. reports suicidal ideation with visual and auditory hallucinations plans to burn or stab himself .

## 2015-11-16 NOTE — ED Notes (Signed)
Pt given notebook. Approved by DR. PT given water. Approved by RN.
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
## 2015-11-16 NOTE — ED Notes (Signed)
Requested Prolixin from pharmacy, pt does not want to take Cogentin until the Prolixin arrives

## 2015-11-17 ENCOUNTER — Emergency Department (HOSPITAL_COMMUNITY): Payer: BLUE CROSS/BLUE SHIELD

## 2015-11-17 DIAGNOSIS — R45851 Suicidal ideations: Secondary | ICD-10-CM | POA: Diagnosis not present

## 2015-11-17 DIAGNOSIS — F1520 Other stimulant dependence, uncomplicated: Secondary | ICD-10-CM

## 2015-11-17 DIAGNOSIS — R45850 Homicidal ideations: Secondary | ICD-10-CM | POA: Diagnosis not present

## 2015-11-17 LAB — URINALYSIS, ROUTINE W REFLEX MICROSCOPIC
Bilirubin Urine: NEGATIVE
Glucose, UA: NEGATIVE mg/dL
Ketones, ur: NEGATIVE mg/dL
Nitrite: NEGATIVE
Protein, ur: NEGATIVE mg/dL
Specific Gravity, Urine: 1.021 (ref 1.005–1.030)
pH: 6.5 (ref 5.0–8.0)

## 2015-11-17 LAB — CBC
HCT: 41.7 % (ref 39.0–52.0)
Hemoglobin: 14.1 g/dL (ref 13.0–17.0)
MCH: 27.6 pg (ref 26.0–34.0)
MCHC: 33.8 g/dL (ref 30.0–36.0)
MCV: 81.8 fL (ref 78.0–100.0)
Platelets: 194 10*3/uL (ref 150–400)
RBC: 5.1 MIL/uL (ref 4.22–5.81)
RDW: 11.7 % (ref 11.5–15.5)
WBC: 6.1 10*3/uL (ref 4.0–10.5)

## 2015-11-17 LAB — URINE MICROSCOPIC-ADD ON

## 2015-11-17 LAB — DIFFERENTIAL
Basophils Absolute: 0 10*3/uL (ref 0.0–0.1)
Basophils Relative: 0 %
Eosinophils Absolute: 0.1 10*3/uL (ref 0.0–0.7)
Eosinophils Relative: 1 %
Lymphocytes Relative: 19 %
Lymphs Abs: 1.1 10*3/uL (ref 0.7–4.0)
Monocytes Absolute: 1.4 10*3/uL — ABNORMAL HIGH (ref 0.1–1.0)
Monocytes Relative: 24 %
Neutro Abs: 3.4 10*3/uL (ref 1.7–7.7)
Neutrophils Relative %: 56 %

## 2015-11-17 LAB — INFLUENZA PANEL BY PCR (TYPE A & B)
H1N1 flu by pcr: NOT DETECTED
Influenza A By PCR: NEGATIVE
Influenza B By PCR: NEGATIVE

## 2015-11-17 MED ORDER — ACETAMINOPHEN 325 MG PO TABS
650.0000 mg | ORAL_TABLET | Freq: Once | ORAL | Status: DC
  Filled 2015-11-17: qty 2

## 2015-11-17 NOTE — ED Notes (Signed)
Pt woke up and ambulated to the restroom w/out any assistance.

## 2015-11-17 NOTE — ED Notes (Signed)
Patient refused a snack, but a regular diet ordered for lunch.

## 2015-11-17 NOTE — ED Notes (Signed)
[place] updated on pt status

## 2015-11-17 NOTE — BH Assessment (Signed)
Pt pending [place] placement referral.

## 2015-11-17 NOTE — ED Notes (Addendum)
Pt refused to take tylenol. Pt stated "I don't want no tylenol man." Pt made aware that tylenol is being used to break his fever.   Pt then given his scheduled medications. Pt mumbled something under his breath. This RN asked pt to repeat himself. Pt responded "nothing man, give me my medicine."

## 2015-11-17 NOTE — Progress Notes (Signed)
Per [facility], patient has been accepted to Obs at [facility], to bed# 2. RN to call report at [phone]. Arrival time: before 20:00. [facility] RN Carlis Abbott has been informed.  Verlon Setting, [place] Disposition staff 11/17/2015 6:45 PM
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
## 2015-11-17 NOTE — ED Provider Notes (Signed)
Psychiatric note from today reviewed. Patient reports to me that he still continues to have strong feelings of suicide. I've contacted psychiatric nurse practitioner's Elmarie Shiley via telephone. Plan he will be transferred to [facility] observation unit for further psychiatric observation  Orlie Dakin, MD 11/17/15 1135

## 2015-11-17 NOTE — ED Notes (Signed)
Prescriptions in property logged and taken to pharmacy.

## 2015-11-17 NOTE — ED Provider Notes (Addendum)
Notified that patient had fever with cough starting this evening. Some mild sore throat and congestion. He is febrile with normal HR and BP. Lungs clear. Abdomen benign. No GU or GI symptoms. No headache, confusion, neck pain/stiffness. Neuro in tact. Suspect viral respiratory illness. CXR without pneumonia. Will obtain repeat CBC, flu swab, UA. Unremarkable CBC without significant leukocytosis. He is influenza negative. No GI symptoms here, and no neuro complaints and normal neuro exam. No meningismus. No concern for shunt infection or intracranial infection. Likely viral illness. Appropriate for transfer to [facility].  Forde Dandy, MD 11/17/15 2001  Forde Dandy, MD 11/17/15 [id number]  Forde Dandy, MD 11/18/15 [id number]

## 2015-11-18 ENCOUNTER — Emergency Department (EMERGENCY_DEPARTMENT_HOSPITAL)
Admission: EM | Admit: 2015-11-18 | Discharge: 2015-11-19 | Disposition: A | Discharge status: Home / Self Care | Payer: BLUE CROSS/BLUE SHIELD | Source: Home / Self Care | Attending: Emergency Medicine | Admitting: Emergency Medicine

## 2015-11-18 ENCOUNTER — Observation Stay (HOSPITAL_COMMUNITY)
Admission: AD | Admit: 2015-11-18 | Discharge: 2015-11-18 | Disposition: A | Discharge status: Home / Self Care | Payer: BLUE CROSS/BLUE SHIELD | Source: Intra-hospital | Attending: Psychiatry | Admitting: Psychiatry

## 2015-11-18 ENCOUNTER — Encounter (HOSPITAL_COMMUNITY): Payer: Self-pay | Admitting: *Deleted

## 2015-11-18 DIAGNOSIS — F209 Schizophrenia, unspecified: Secondary | ICD-10-CM | POA: Insufficient documentation

## 2015-11-18 DIAGNOSIS — G40909 Epilepsy, unspecified, not intractable, without status epilepticus: Secondary | ICD-10-CM

## 2015-11-18 DIAGNOSIS — F250 Schizoaffective disorder, bipolar type: Secondary | ICD-10-CM | POA: Diagnosis present

## 2015-11-18 DIAGNOSIS — J45909 Unspecified asthma, uncomplicated: Secondary | ICD-10-CM | POA: Insufficient documentation

## 2015-11-18 DIAGNOSIS — F319 Bipolar disorder, unspecified: Secondary | ICD-10-CM

## 2015-11-18 DIAGNOSIS — R45851 Suicidal ideations: Secondary | ICD-10-CM

## 2015-11-18 DIAGNOSIS — F1520 Other stimulant dependence, uncomplicated: Secondary | ICD-10-CM | POA: Diagnosis present

## 2015-11-18 DIAGNOSIS — Z87891 Personal history of nicotine dependence: Secondary | ICD-10-CM

## 2015-11-18 LAB — RAPID URINE DRUG SCREEN, HOSP PERFORMED
Amphetamines: POSITIVE — AB
Barbiturates: POSITIVE — AB
Benzodiazepines: NOT DETECTED
Cocaine: NOT DETECTED
Opiates: NOT DETECTED
Tetrahydrocannabinol: NOT DETECTED

## 2015-11-18 LAB — COMPREHENSIVE METABOLIC PANEL
ALT: 38 U/L (ref 17–63)
AST: 33 U/L (ref 15–41)
Albumin: 4 g/dL (ref 3.5–5.0)
Alkaline Phosphatase: 70 U/L (ref 38–126)
Anion gap: 10 (ref 5–15)
BUN: 10 mg/dL (ref 6–20)
CO2: 24 mmol/L (ref 22–32)
Calcium: 8.9 mg/dL (ref 8.9–10.3)
Chloride: 102 mmol/L (ref 101–111)
Creatinine, Ser: 1 mg/dL (ref 0.61–1.24)
GFR calc Af Amer: >60 mL/min (ref >60)
GFR calc non Af Amer: >60 mL/min (ref >60)
Glucose, Bld: 132 mg/dL — ABNORMAL HIGH (ref 65–99)
Potassium: 3.6 mmol/L (ref 3.5–5.1)
Sodium: 136 mmol/L (ref 135–145)
Total Bilirubin: 1.4 mg/dL — ABNORMAL HIGH (ref 0.3–1.2)
Total Protein: 7 g/dL (ref 6.5–8.1)

## 2015-11-18 LAB — ETHANOL: Alcohol, Ethyl (B): <5 mg/dL (ref <5)

## 2015-11-18 LAB — ACETAMINOPHEN LEVEL: Acetaminophen (Tylenol), Serum: <10 ug/mL — ABNORMAL LOW (ref 10–30)

## 2015-11-18 LAB — CBC
HCT: 42 % (ref 39.0–52.0)
Hemoglobin: 14.7 g/dL (ref 13.0–17.0)
MCH: 28 pg (ref 26.0–34.0)
MCHC: 35 g/dL (ref 30.0–36.0)
MCV: 80 fL (ref 78.0–100.0)
Platelets: 172 10*3/uL (ref 150–400)
RBC: 5.25 MIL/uL (ref 4.22–5.81)
RDW: 11.7 % (ref 11.5–15.5)
WBC: 5.8 10*3/uL (ref 4.0–10.5)

## 2015-11-18 LAB — SALICYLATE LEVEL: Salicylate Lvl: <4 mg/dL (ref 2.8–30.0)

## 2015-11-18 MED ORDER — TRAZODONE HCL 50 MG PO TABS: 50.0000 mg | ORAL_TABLET | Freq: Every evening | ORAL | Status: DC | PRN

## 2015-11-18 MED ORDER — LORAZEPAM 1 MG PO TABS: 1.0000 mg | ORAL_TABLET | Freq: Once | ORAL | Status: DC | PRN

## 2015-11-18 MED ORDER — LITHIUM CARBONATE ER 450 MG PO TBCR: 450.0000 mg | EXTENDED_RELEASE_TABLET | Freq: Two times a day (BID) | ORAL | Status: DC

## 2015-11-18 MED ORDER — NICOTINE 21 MG/24HR TD PT24
21.0000 mg | MEDICATED_PATCH | Freq: Every day | TRANSDERMAL | Status: DC
  Filled 2015-11-18: qty 1

## 2015-11-18 MED ORDER — BENZTROPINE MESYLATE 1 MG PO TABS
0.5000 mg | ORAL_TABLET | Freq: Two times a day (BID) | ORAL | Status: DC
  Administered 2015-11-18 – 2015-11-19 (×2): 0.5 mg via ORAL
  Filled 2015-11-18 (×2): qty 1

## 2015-11-18 MED ORDER — ACETAMINOPHEN 325 MG PO TABS: 650.0000 mg | ORAL_TABLET | Freq: Four times a day (QID) | ORAL | Status: DC | PRN

## 2015-11-18 MED ORDER — BENZTROPINE MESYLATE 0.5 MG PO TABS
0.5000 mg | ORAL_TABLET | Freq: Two times a day (BID) | ORAL | Status: DC
  Administered 2015-11-18: 0.5 mg via ORAL
  Filled 2015-11-18: qty 1

## 2015-11-18 MED ORDER — FLUPHENAZINE HCL 5 MG PO TABS
5.0000 mg | ORAL_TABLET | Freq: Every day | ORAL | Status: DC
  Administered 2015-11-18: 5 mg via ORAL
  Filled 2015-11-18: qty 1

## 2015-11-18 MED ORDER — LITHIUM CARBONATE ER 450 MG PO TBCR
450.0000 mg | EXTENDED_RELEASE_TABLET | Freq: Two times a day (BID) | ORAL | Status: DC
  Administered 2015-11-18: 450 mg via ORAL
  Filled 2015-11-18: qty 1

## 2015-11-18 MED ORDER — BENZTROPINE MESYLATE 0.5 MG PO TABS: 0.5000 mg | ORAL_TABLET | Freq: Two times a day (BID) | ORAL | Status: DC

## 2015-11-18 MED ORDER — FLUPHENAZINE HCL 5 MG PO TABS: 10.0000 mg | ORAL_TABLET | Freq: Every day | ORAL | Status: DC

## 2015-11-18 MED ORDER — LITHIUM CARBONATE ER 450 MG PO TBCR
450.0000 mg | EXTENDED_RELEASE_TABLET | Freq: Two times a day (BID) | ORAL | Status: DC
Start: 2015-11-19 — End: 2015-11-19
  Administered 2015-11-19: 450 mg via ORAL
  Filled 2015-11-18 (×3): qty 1

## 2015-11-18 MED ORDER — MAGNESIUM HYDROXIDE 400 MG/5ML PO SUSP: 30.0000 mL | Freq: Every day | ORAL | Status: DC | PRN

## 2015-11-18 MED ORDER — ALUM & MAG HYDROXIDE-SIMETH 200-200-20 MG/5ML PO SUSP: 30.0000 mL | ORAL | Status: DC | PRN

## 2015-11-18 MED ORDER — FLUPHENAZINE HCL 5 MG PO TABS
5.0000 mg | ORAL_TABLET | Freq: Every day | ORAL | Status: DC
Start: 2015-11-18 — End: 2015-11-19
  Administered 2015-11-19: 5 mg via ORAL
  Filled 2015-11-18 (×2): qty 1

## 2015-11-18 MED ORDER — FLUPHENAZINE HCL 10 MG PO TABS: 10.0000 mg | ORAL_TABLET | Freq: Every day | ORAL | Status: DC

## 2015-11-18 MED ORDER — ACETAMINOPHEN 325 MG PO TABS: 650.0000 mg | ORAL_TABLET | ORAL | Status: DC | PRN

## 2015-11-18 MED ORDER — GI COCKTAIL ~~LOC~~
30.0000 mL | Freq: Once | ORAL | Status: AC
Start: 2015-11-18 — End: 2015-11-18
  Administered 2015-11-18: 30 mL via ORAL
  Filled 2015-11-18: qty 30

## 2015-11-18 MED ORDER — FLUPHENAZINE HCL 5 MG PO TABS: 5.0000 mg | ORAL_TABLET | Freq: Every day | ORAL | Status: DC

## 2015-11-18 MED ORDER — FLUPHENAZINE HCL 10 MG PO TABS
10.0000 mg | ORAL_TABLET | Freq: Every day | ORAL | Status: DC
Start: 2015-11-18 — End: 2015-11-19
  Administered 2015-11-18: 10 mg via ORAL
  Filled 2015-11-18 (×2): qty 1

## 2015-11-18 NOTE — H&P (Signed)
[place] Unit Admission Assessment Adult  Patient Identification: Justin Chaney MRN:  [id number] Date of Evaluation:  11/18/2015 Chief Complaint: Pt states "I am only interested in you giving me inpatient treatment."   Principal Diagnosis: Schizoaffective disorder, bipolar type [facility]) Diagnosis:   Patient Active Problem List   Diagnosis Date Noted  . Hallucinations [R44.3]   . Homicidal ideation [R45.850]   . Suicidal thoughts [R45.851]   . Cocaine use disorder, severe, dependence ([place]) [F14.20] 11/07/2015  . Amphetamine use disorder, severe, dependence ([place]) [F15.20] 11/07/2015  . Cannabis use disorder, moderate, in sustained remission [F12.90] 11/07/2015  . Intermittent explosive disorder [F63.81] 09/05/2015  . Schizoaffective disorder, bipolar type ([place]) [F25.0] 09/04/2015  . Non compliance w medication regimen [Z91.14] 01/17/2015    History of Present Illness::   Justin Chaney is a 20 year old male who presented to the [facility] with reports of suicidal/homicidal ideation and command hallucinations. He was recently discharged from the [facility] on 11/14/2015 after being evaluated by Dr. Darleene Cleaver. Per review of his consult note the patient had reported that his symptoms were related to amphetamine abuse and the patient agreed to stop using it. His current urine drug screen two days later is positive for amphetamines and barbituates. Patient is not cooperative with the interview today. He becomes aggravated when asked to elaborate on his symptoms given that the patient was recently stabilized at [facility] on the [address] hall. The patient has been uncooperative with answering assessment questions by multiple providers. He insists that he needs more stabilization at [facility] but is unable to justify any symptoms requiring such a high level of care. Patient was also resistant to treatment during his recent admission to [facility] as notes indicated that he refused medications and did not attend the groups. He is  documented during admission to Observation unit to refuse to disclose why he was seeking treatment. Patient has not been observed to be experiencing any psychotic symptoms and has a history of chronic suicidal thoughts. He was assessed with Dr. Dwyane Dee this morning where the patient also refused to answer social questions asking "Why is that relevant. I need help." Patient was resistant to considering other treatment options such as ACT team instead insisting on needing "long term treatment." The patient is noted to not be invested in his treatment and was observed refusing his psychotropic medications this morning from the nurse. Patient has been tried on Abilify, seroquel, risperidone, zyprexa and most recently [place] . Pt reports that he does not feel any of these medications were effective. However per review of EHR there is a hx of being noncompliant on medications and frequent re- hospitalizations. Patient reports a hx of substance abuse. He does not appear motivated to address his substance abuse at this time but was informed that was negatively impacting his mental health. Patient currently does not have an out-patient provider at this time as he did not follow up as directed after recent discharge.   Associated Signs/Symptoms: Depression Symptoms:  Denies but will not elaborate on his symptoms  (Hypo) Manic Symptoms:  Grandiosity, Impulsivity, Irritable Mood, Labiality of Mood, Anxiety Symptoms:  Excessive worry  Psychotic Symptoms:  Reports hearing voices of demons PTSD Symptoms: Negative Total Time spent with patient: 45 minutes  Past Psychiatric History: Schizoaffective Disorder , has has multiple hospitalizations - atleast 20 . Pt was admitted at [facility] ,[place], [place]. Pt was scheduled to follow up with Neuropsychiatric care - but he did not .  Is the patient at risk  to self? No.  Has the patient been a risk to self in the past 6 months? Yes.    Has the patient  been a risk to self within the distant past? Yes.    Is the patient a risk to others? No.  Has the patient been a risk to others in the past 6 months? Yes.    Has the patient been a risk to others within the distant past? Yes.     Prior Inpatient Therapy:  see above Prior Outpatient Therapy:  see above  Alcohol Screening: Patient refused Alcohol Screening Tool: Yes Substance Abuse History in the last 12 months:  Yes.  cocaine - since 67 y old- current urine drug screen negative,  amphetamines since 54 y old - daily use - last use few weeks ago, abused cananbis in highschool, abused molly once and mushroom once Consequences of Substance Abuse: Family Consequences:  Family discord Previous Psychotropic Medications: Yes - abiliy, risperidone, seroquel, zyprexa, latuda Psychological Evaluations: No  Past Medical History:  Past Medical History  Diagnosis Date  . Asthma   . Seizures ([place])   . Brain ventricular shunt obstruction     hydrochelpis  . Depression   . Bipolar disorder ([place])   . Schizophrenia ([place])   . Homelessness     Past Surgical History  Procedure Laterality Date  . Tonsillectomy    . Ventriculo-peritoneal shunt placement / laparoscopic insertion peritoneal catheter     Family History:  Family History  Problem Relation Age of Onset  . Hypertension Mother   . Mental illness Neg Hx    Family Psychiatric  History: pt reports he does not know anything about most of his family Tobacco Screening:denies Social History: is single , lives with friends in [place] , is estranged from family - reports that he has mother and siblings , but he does not get along with her , employed doing day labor , denies legal issues pending. History  Alcohol Use  . Yes     History  Drug Use  . Yes  . Special: Marijuana, Other-see comments, Amphetamines    Comment: Adderall, "I don't do weed, only rarely"    Additional Social History:  Allergies:   Allergies  Allergen Reactions  .  Amoxicillin Hives    .Marland KitchenHas patient had a PCN reaction causing immediate rash, facial/tongue/throat swelling, SOB or lightheadedness with hypotension: unknown Has patient had a PCN reaction causing severe rash involving mucus membranes or skin necrosis:Yes Has patient had a PCN reaction that required hospitalization unknown Has patient had a PCN reaction occurring within the last 10 years: unknown If all of the above answers are "NO", then may proceed with Cephalosporin use.   Marland Kitchen Penicillins Hives and Rash    Has patient had a PCN reaction causing immediate rash, facial/tongue/throat swelling, SOB or lightheadedness with hypotension: unknown Has patient had a PCN reaction causing severe rash involving mucus membranes or skin necrosis: Yes Has patient had a PCN reaction that required hospitalization :unknown Has patient had a PCN reaction occurring within the last 10 years: unknown If all of the above answers are "NO", then may proceed with Cephalosporin use.    Lab Results:  Results for orders placed or performed during the hospital encounter of 11/16/15 (from the past 48 hour(s))  Urine rapid drug screen (hosp performed) (Not at [facility])     Status: Abnormal   Collection Time: 11/16/15  7:40 PM  Result Value Ref Range   Opiates NONE DETECTED NONE  DETECTED   Cocaine NONE DETECTED NONE DETECTED   Benzodiazepines NONE DETECTED NONE DETECTED   Amphetamines POSITIVE (A) NONE DETECTED   Tetrahydrocannabinol NONE DETECTED NONE DETECTED   Barbiturates POSITIVE (A) NONE DETECTED    Comment:        DRUG SCREEN FOR MEDICAL PURPOSES ONLY.  IF CONFIRMATION IS NEEDED FOR ANY PURPOSE, NOTIFY LAB WITHIN 5 DAYS.        LOWEST DETECTABLE LIMITS FOR URINE DRUG SCREEN Drug Class       Cutoff (ng/mL) Amphetamine      1000 Barbiturate      200 Benzodiazepine   947 Tricyclics       096 Opiates          300 Cocaine          300 THC              50   Comprehensive metabolic panel     Status: Abnormal    Collection Time: 11/16/15  7:45 PM  Result Value Ref Range   Sodium 139 135 - 145 mmol/L   Potassium 3.4 (L) 3.5 - 5.1 mmol/L   Chloride 106 101 - 111 mmol/L   CO2 22 22 - 32 mmol/L   Glucose, Bld 97 65 - 99 mg/dL   BUN 12 6 - 20 mg/dL   Creatinine, Ser 1.01 0.61 - 1.24 mg/dL   Calcium 9.1 8.9 - 10.3 mg/dL   Total Protein 6.8 6.5 - 8.1 g/dL   Albumin 3.9 3.5 - 5.0 g/dL   AST 38 15 - 41 U/L   ALT 38 17 - 63 U/L   Alkaline Phosphatase 74 38 - 126 U/L   Total Bilirubin 1.0 0.3 - 1.2 mg/dL   GFR calc non Af Amer >60 >60 mL/min   GFR calc Af Amer >60 >60 mL/min    Comment: (NOTE) The eGFR has been calculated using the CKD EPI equation. This calculation has not been validated in all clinical situations. eGFR's persistently <60 mL/min signify possible Chronic Kidney Disease.    Anion gap 11 5 - 15  Ethanol (ETOH)     Status: None   Collection Time: 11/16/15  7:45 PM  Result Value Ref Range   Alcohol, Ethyl (B) <5 <5 mg/dL    Comment:        LOWEST DETECTABLE LIMIT FOR SERUM ALCOHOL IS 5 mg/dL FOR MEDICAL PURPOSES ONLY   Salicylate level     Status: None   Collection Time: 11/16/15  7:45 PM  Result Value Ref Range   Salicylate Lvl <2.8 2.8 - 30.0 mg/dL  Acetaminophen level     Status: Abnormal   Collection Time: 11/16/15  7:45 PM  Result Value Ref Range   Acetaminophen (Tylenol), Serum <10 (L) 10 - 30 ug/mL    Comment:        THERAPEUTIC CONCENTRATIONS VARY SIGNIFICANTLY. A RANGE OF 10-30 ug/mL MAY BE AN EFFECTIVE CONCENTRATION FOR MANY PATIENTS. HOWEVER, SOME ARE BEST TREATED AT CONCENTRATIONS OUTSIDE THIS RANGE. ACETAMINOPHEN CONCENTRATIONS >150 ug/mL AT 4 HOURS AFTER INGESTION AND >50 ug/mL AT 12 HOURS AFTER INGESTION ARE OFTEN ASSOCIATED WITH TOXIC REACTIONS.   CBC     Status: Abnormal   Collection Time: 11/16/15  7:45 PM  Result Value Ref Range   WBC 11.2 (H) 4.0 - 10.5 K/uL   RBC 5.19 4.22 - 5.81 MIL/uL   Hemoglobin 14.2 13.0 - 17.0 g/dL   HCT 42.7 39.0  - 52.0 %   MCV 82.3 78.0 -  100.0 fL   MCH 27.4 26.0 - 34.0 pg   MCHC 33.3 30.0 - 36.0 g/dL   RDW 11.9 11.5 - 15.5 %   Platelets 227 150 - 400 K/uL  Influenza panel by PCR (type A & B, H1N1)     Status: None   Collection Time: 11/17/15  8:00 PM  Result Value Ref Range   Influenza A By PCR NEGATIVE NEGATIVE   Influenza B By PCR NEGATIVE NEGATIVE   H1N1 flu by pcr NOT DETECTED NOT DETECTED    Comment:        The Xpert Flu assay (FDA approved for nasal aspirates or washes and nasopharyngeal swab specimens), is intended as an aid in the diagnosis of influenza and should not be used as a sole basis for treatment.   CBC     Status: None   Collection Time: 11/17/15  8:27 PM  Result Value Ref [place]   WBC 6.1 4.0 - 10.5 K/uL   RBC 5.10 4.22 - 5.81 MIL/uL   Hemoglobin 14.1 13.0 - 17.0 g/dL   HCT 41.7 39.0 - 52.0 %   MCV 81.8 78.0 - 100.0 fL   MCH 27.6 26.0 - 34.0 pg   MCHC 33.8 30.0 - 36.0 g/dL   RDW 11.7 11.5 - 15.5 %   Platelets 194 150 - 400 K/uL  Differential     Status: Abnormal   Collection Time: 11/17/15  8:27 PM  Result Value Ref Range   Neutrophils Relative % 56 %   Neutro Abs 3.4 1.7 - 7.7 K/uL   Lymphocytes Relative 19 %   Lymphs Abs 1.1 0.7 - 4.0 K/uL   Monocytes Relative 24 %   Monocytes Absolute 1.4 (H) 0.1 - 1.0 K/uL   Eosinophils Relative 1 %   Eosinophils Absolute 0.1 0.0 - 0.7 K/uL   Basophils Relative 0 %   Basophils Absolute 0.0 0.0 - 0.1 K/uL  Urinalysis, Routine w reflex microscopic (not at [facility])     Status: Abnormal   Collection Time: 11/17/15  8:32 PM  Result Value Ref Range   Color, Urine YELLOW YELLOW   APPearance HAZY (A) CLEAR   Specific Gravity, Urine 1.021 1.005 - 1.030   pH 6.5 5.0 - 8.0   Glucose, UA NEGATIVE NEGATIVE mg/dL   Hgb urine dipstick SMALL (A) NEGATIVE   Bilirubin Urine NEGATIVE NEGATIVE   Ketones, ur NEGATIVE NEGATIVE mg/dL   Protein, ur NEGATIVE NEGATIVE mg/dL   Nitrite NEGATIVE NEGATIVE   Leukocytes, UA MODERATE (A) NEGATIVE     Comment: REPEATED TO VERIFY  Urine microscopic-add on     Status: Abnormal   Collection Time: 11/17/15  8:32 PM  Result Value Ref Range   Squamous Epithelial / LPF 0-5 (A) NONE SEEN   WBC, UA 0-5 0 - 5 WBC/hpf   RBC / HPF 0-5 0 - 5 RBC/hpf   Bacteria, UA FEW (A) NONE SEEN   Urine-Other LESS THAN 10 mL OF URINE SUBMITTED     Blood Alcohol level:  Lab Results  Component Value Date   ETH <5 11/16/2015   ETH 84* 37/05/6268    Metabolic Disorder Labs:  Lab Results  Component Value Date   HGBA1C 5.0 09/09/2015   MPG 97 09/09/2015   Lab Results  Component Value Date   PROLACTIN 8.0 09/09/2015   Lab Results  Component Value Date   CHOL 133 09/09/2015   TRIG 70 09/09/2015   HDL 35* 09/09/2015   CHOLHDL 3.8 09/09/2015   VLDL  14 09/09/2015   LDLCALC 84 09/09/2015    Current Medications: Current Facility-Administered Medications  Medication Dose Route Frequency Provider Last Rate Last Dose  . acetaminophen (TYLENOL) tablet 650 mg  650 mg Oral Q6H PRN Niel Hummer, NP      . alum & mag hydroxide-simeth (MAALOX/MYLANTA) 200-200-20 MG/5ML suspension 30 mL  30 mL Oral Q4H PRN Niel Hummer, NP      . benztropine (COGENTIN) tablet 0.5 mg  0.5 mg Oral BID Niel Hummer, NP   0.5 mg at 11/18/15 1121  . fluPHENAZine (PROLIXIN) tablet 10 mg  10 mg Oral QHS Niel Hummer, NP      . fluPHENAZine (PROLIXIN) tablet 5 mg  5 mg Oral Daily Niel Hummer, NP   5 mg at 11/18/15 1119  . lithium carbonate (ESKALITH) CR tablet 450 mg  450 mg Oral BID PC Niel Hummer, NP   450 mg at 11/18/15 1120  . magnesium hydroxide (MILK OF MAGNESIA) suspension 30 mL  30 mL Oral Daily PRN Niel Hummer, NP      . nicotine (NICODERM CQ - dosed in mg/24 hours) patch 21 mg  21 mg Transdermal Daily Niel Hummer, NP   21 mg at 11/18/15 3220  . traZODone (DESYREL) tablet 50 mg  50 mg Oral QHS PRN Niel Hummer, NP       PTA Medications: Prescriptions prior to admission  Medication Sig Dispense Refill Last  Dose  . traZODone (DESYREL) 50 MG tablet Take 1 tablet (50 mg total) by mouth at bedtime. (Patient taking differently: Take 50 mg by mouth at bedtime as needed. ) 30 tablet 0 Past Week at Unknown time  . [DISCONTINUED] benztropine (COGENTIN) 0.5 MG tablet Take 1 tablet (0.5 mg total) by mouth 2 (two) times daily. 60 tablet 0 11/15/2015  . [DISCONTINUED] fluPHENAZine (PROLIXIN) 10 MG tablet Take 1 tablet (10 mg total) by mouth at bedtime. 30 tablet 0 11/15/2015  . [DISCONTINUED] fluPHENAZine (PROLIXIN) 5 MG tablet Take 1 tablet (5 mg total) by mouth daily. 30 tablet 0 11/15/2015  . [DISCONTINUED] lithium carbonate (ESKALITH) 450 MG CR tablet Take 1 tablet (450 mg total) by mouth 2 (two) times daily after a meal. 60 tablet 0 11/17/2015 at Unknown time  . hydrOXYzine (ATARAX/VISTARIL) 25 MG tablet Take 1 tablet (25 mg total) by mouth every 6 (six) hours as needed for anxiety. (Patient not taking: Reported on 11/16/2015) 30 tablet 0 Not Taking at Unknown time  . nicotine (NICODERM CQ - DOSED IN MG/24 HOURS) 21 mg/24hr patch Place 1 patch (21 mg total) onto the skin daily. For smoking cessation (Patient not taking: Reported on 11/16/2015) 28 patch 0 Not Taking at Unknown time    Musculoskeletal: Strength & Muscle Tone: within normal limits Gait & Station: normal Patient leans: N/A  Psychiatric Specialty Exam: Physical Exam  Nursing note and vitals reviewed. Musculoskeletal: Normal range of motion.  Skin: Skin is dry.  Psychiatric: His speech is normal. His mood appears anxious. Cognition and memory are normal. He expresses impulsivity.    Review of Systems  Unable to perform ROS: other  Psychiatric/Behavioral: Positive for depression and substance abuse. Negative for suicidal ideas, hallucinations and memory loss. The patient is nervous/anxious. The patient does not have insomnia.        Unable to assess at this time.  Patient refusing to talk  All other systems reviewed and are negative.   Blood  pressure 112/70, pulse 93, temperature 98.8  F (37.1 C), temperature source Oral, resp. rate 20, height '5\' 3"'  (1.6 m), weight 99.791 kg (220 lb), SpO2 96 %.Body mass index is 38.98 kg/(m^2).  General Appearance: Guarded  Eye Contact::  Fair  Speech:  Varies due to his agitation   Volume:  Normal  Mood:  Angry and Irritable  Affect:  Congruent  Thought Process:  Irrelevant  Orientation:  Full (Time, Place, and Person)  Thought Content:  Rumination  Suicidal Thoughts:  No  Homicidal Thoughts:  No  Memory:  Immediate;   Poor Recent;   Poor Remote;   Poor  Judgement:  Impaired  Insight:  Lacking and Shallow  Psychomotor Activity:  Normal  Concentration:  Fair  Recall:  [employer] of Knowledge:Fair  Language: Good  Akathisia:  No  Handed:  Right  AIMS (if indicated):     Assets:  Others:  access to health care  ADL's:  Intact  Cognition: WNL  Sleep:  Number of Hours: 5.25 hrs   Treatment Plan Summary:Justin Chaney is a 20 yr old AA male who is single , employed -does odd jobs , lives with friends in [place] , has a hx of schizoaffective do as well as polysubstance abuse, presented to [facility] with psychosis.  Daily contact with patient to assess and evaluate symptoms and progress in treatment and Medication management   Patient will benefit from treatment and stabilization in the [facility].  Estimated length of stay is 24-48 hours.  Reviewed past medical records,treatment plan.  Will continue Prolixin 5 mg po in am and 10 mg in evening for psychosis.  Will continue Cogentin 0.5 mg po bid  for EPS. Will continue Lithium CR 450 mg po bid for mood swings Will continue Trazodone 50 mg po qhs for sleep. Will continue to monitor vitals ,medication compliance and treatment side effects while patient is here.  Will monitor for medical issues as well as call consult as needed.  CSW will start working on disposition.  Patient to participate in therapeutic milieu .   Observation  Level/Precautions:  Continuous Observation   Medications: Restart psychotropic medications   Psychotherapy:  Individual counseling     Consultations:  None  Discharge Concerns:  Safety, stabilization, and access to medication       Elmarie Shiley, NP 4/18/20172:12 PM

## 2015-11-18 NOTE — Progress Notes (Signed)
Nursing Discharge Note  Patient expressing anger and complaining that "y'all ain't done nothing for me" as he has been told he is indeed being discharged.  Patient's belongings gathered by staff member and Animal nutritionist, both of whom returning to the Obs Unit with patient's belongings. The AVS has been printed twice, one to be signed by staff and patient for the chart, and the other copy for the patient to take with him. Patient stating "I ain't signing anything" and continuing to sign the AVS and the Belongings Sheet, and would not take a copy of the AVS in his hand to take with him. Patient given bus pass and is still grumbling about his stay as Animal nutritionist walking him to [employer] and seeing him exit the premises headed in the direction of the the bus stop across the street.

## 2015-11-18 NOTE — Discharge Instructions (Signed)
You are encouraged to follow up with [facility] for your immediate mental health needs. You are encouraged to follow up with [facility] for your immediate substance abuse needs and for help in recovery. As per your request referrals have been sent to inpatient substance abuse and you are recommended to follow up with them for placement and acceptance.

## 2015-11-18 NOTE — BH Assessment (Signed)
Consulted with Patriciaann Clan, PA-C who recommends that patient be discharged to follow up with outpatient providers. [place], PA-C reports that she does not feel comfortable discharging the patient. Patriciaann Clan, PA_C recommends patient remaining in ED overnight to be discharged by psychiatry tomorrow morning.   Rosalin Hawking, LCSW Therapeutic Triage Specialist [place] 11/18/2015 10:49 PM

## 2015-11-18 NOTE — ED Notes (Signed)
Patient complains of indigestion. Patient denies CP at this time. Respirations equal and unlabored. Skin warm and dry. Note acute distress noted. Amie [place], PA informed of patient complaint and new orders received. Q 15 min safety checks remain in place.

## 2015-11-18 NOTE — Discharge Summary (Signed)
Justin Chaney Unit Discharge Summary Note  Patient:  Justin Chaney is an 20 y.o., male MRN:  [id number] DOB:  October 14, 1995  Patient phone:  [phone] (home)   Patient address:   [place] [address],   Total Time spent with patient: Greater than 30 minutes  Date of Admission:  11/18/2015  Date of Discharge: 04/18//2017  Reason for Admission: Worsening symptoms of depression triggering suicidal ideations.  Principal Problem: Schizoaffective disorder, bipolar type [facility])  Discharge Diagnoses: Patient Active Problem List   Diagnosis Date Noted  . Hallucinations [R44.3]   . Homicidal ideation [R45.850]   . Suicidal thoughts [R45.851]   . Cocaine use disorder, severe, dependence ([place]) [F14.20] 11/07/2015  . Amphetamine use disorder, severe, dependence (Justin Chaney) [F15.20] 11/07/2015  . Cannabis use disorder, moderate, in sustained remission [F12.90] 11/07/2015  . Intermittent explosive disorder [F63.81] 09/05/2015  . Schizoaffective disorder, bipolar type ([place]) [F25.0] 09/04/2015  . Non compliance w medication regimen [Z91.14] 01/17/2015   Past Psychiatric History: HX. Schizoaffective disorder, bipolar-type.  Past Medical History:  Past Medical History  Diagnosis Date  . Asthma   . Seizures ([place])   . Brain ventricular shunt obstruction     hydrochelpis  . Depression   . Bipolar disorder ([place])   . Schizophrenia ([place])   . Homelessness     Past Surgical History  Procedure Laterality Date  . Tonsillectomy    . Ventriculo-peritoneal shunt placement / laparoscopic insertion peritoneal catheter     Family History:  Family History  Problem Relation Age of Onset  . Hypertension Mother   . Mental illness Neg Hx    Family Psychiatric  History: See H&P  Social History:  History  Alcohol Use  . Yes     History  Drug Use  . Yes  . Special: Marijuana, Other-see comments, Amphetamines    Comment: Adderall, "I don't do weed, only rarely"    Social  History   Social History  . Marital Status: Single    Spouse Name: N/A  . Number of Children: N/A  . Years of Education: N/A   Social History Main Topics  . Smoking status: Former Smoker -- 0.50 packs/day    Types: Cigarettes    Quit date: 10/22/2015  . Smokeless tobacco: None  . Alcohol Use: Yes  . Drug Use: Yes    Special: Marijuana, Other-see comments, Amphetamines     Comment: Adderall, "I don't do weed, only rarely"  . Sexual Activity: Not Asked   Other Topics Concern  . None   Social History Narrative    Hospital Course:    Justin Chaney is a 20 year old male who presented to the [facility] with reports of suicidal/homicidal ideation and command hallucinations. He was recently discharged from the [facility] on 11/14/2015 after being evaluated by Justin Chaney. Per review of his consult note the patient had reported that his symptoms were related to amphetamine abuse and the patient agreed to stop using it. His current urine drug screen two days later is positive for amphetamines and barbituates. Patient is not cooperative with the interview today. He becomes aggravated when asked to elaborate on his symptoms given that the patient was recently stabilized at [facility] on the [facility] hall. The patient has been uncooperative with answering assessment questions by multiple providers. He insists that he needs more stabilization at [facility] but is unable to justify any symptoms requiring such a high level of care. Patient was also resistant to treatment during his recent admission to  [place] as notes indicated that he refused medications and did not attend the groups. He is documented during admission to Observation unit to refuse to disclose why he was seeking treatment. Patient has not been observed to be experiencing any psychotic symptoms and has a history of chronic suicidal thoughts. He was assessed with Justin Chaney this morning where the patient also refused to answer social questions asking "Why is that relevant.  I need help." Patient was resistant to considering other treatment options such as ACT team instead insisting on needing "long term treatment." The patient is noted to not be invested in his treatment and was observed refusing his psychotropic medications this morning from the nurse. Patient has been tried on Abilify, seroquel, risperidone, zyprexa and most recently [place] . Patient reports that he does not feel any of these medications were effective. However, per review of EHR there is a hx of being noncompliant on medications and frequent re- hospitalizations. Patient reports a hx of substance abuse. He does not appear motivated to address his substance abuse at this time but was informed that was negatively impacting his mental health. Patient declined substance abuse treatment outside of local areas. Per notes from the counselor the patient refused [name]. Patient was found stable for discharge from the Observation Unit as the patient was not invested in his treatment and was refusing help that was offered to him. Justin Chaney explained to the patient that [facility] was not an option for long term treatment but that his symptoms could be managed in the community setting. Justin Chaney was not in agreement with this plan and was not receptive to being set up with an ACT team. Upon discharge, Justin Chaney was both mentally and medically stable for discharge. Patient was evaluated by Justin Chaney who agreed patient is not a danger to himself or others at this time. He is currently denying SIHI, auditory/visual/tactile hallucinations, delusional thoughts & or paranoia. He left [facility] in no apparent distress with all personal belongings. Transportation per city bus. [place] assisted with bus pass.   Physical Findings: AIMS: Facial and Oral Movements Muscles of Facial Expression: None, normal Lips and Perioral Area: None, normal Jaw: None, normal Tongue: None, normal,Extremity Movements Upper (arms,  wrists, hands, fingers): None, normal Lower (legs, knees, ankles, toes): None, normal, Trunk Movements Neck, shoulders, hips: None, normal, Overall Severity Severity of abnormal movements (highest score from questions above): None, normal Incapacitation due to abnormal movements: None, normal Patient's awareness of abnormal movements (rate only patient's report): No Awareness, Dental Status Current problems with teeth and/or dentures?: No Does patient usually wear dentures?: No  CIWA:    COWS:     Musculoskeletal: Strength & Muscle Tone: within normal limits Gait & Station: normal Patient leans: N/A  Review of Systems  Constitutional: Negative.   HENT: Negative.   Eyes: Negative.   Respiratory: Negative.   Cardiovascular: Negative.   Gastrointestinal: Negative.   Genitourinary: Negative.   Musculoskeletal: Negative.   Skin: Negative.   Neurological: Negative.   Endo/Heme/Allergies: Negative.   Psychiatric/Behavioral: Positive for depression (Stable ) and substance abuse (Hx. Cocaine abuse). Negative for suicidal ideas, hallucinations and memory loss. The patient has insomnia (Stable). The patient is not nervous/anxious.   All other systems reviewed and are negative. See Md's SRA  Blood pressure 112/70, pulse 93, temperature 98.8 F (37.1 C), temperature source Oral, resp. rate 20, height 5\' 3"  (1.6 m), weight 99.791 kg (220 lb), SpO2 96 %.Body mass index is 38.98  kg/(m^2).  Have you used any form of tobacco in the last 30 days? (Cigarettes, Smokeless Tobacco, Cigars, and/or Pipes): Yes  Has this patient used any form of tobacco in the last 30 days? (Cigarettes, Smokeless Tobacco, Cigars, and/or Pipes): Yes, A prescription for an FDA-approved tobacco cessation medication was offered at discharge and the patient refused  Metabolic Disorder Labs:  Lab Results  Component Value Date   HGBA1C 5.0 09/09/2015   MPG 97 09/09/2015   Lab Results  Component Value Date   PROLACTIN 8.0  09/09/2015   Lab Results  Component Value Date   CHOL 133 09/09/2015   TRIG 70 09/09/2015   HDL 35* 09/09/2015   CHOLHDL 3.8 09/09/2015   VLDL 14 09/09/2015   LDLCALC 84 09/09/2015   Discharge destination:  Home  Is patient on multiple antipsychotic therapies at discharge:  No   Has Patient had three or more failed trials of antipsychotic monotherapy by history:  No  Recommended Plan for Multiple Antipsychotic Therapies: NA    Medication List    TAKE these medications      Indication   benztropine 0.5 MG tablet  Commonly known as:  COGENTIN  Take 1 tablet (0.5 mg total) by mouth 2 (two) times daily.   Indication:  Extrapyramidal Reaction caused by Medications     fluPHENAZine 10 MG tablet  Commonly known as:  PROLIXIN  Take 1 tablet (10 mg total) by mouth at bedtime.   Indication:  Psychosis     fluPHENAZine 5 MG tablet  Commonly known as:  PROLIXIN  Take 1 tablet (5 mg total) by mouth daily.   Indication:  Psychosis     hydrOXYzine 25 MG tablet  Commonly known as:  ATARAX/VISTARIL  Take 1 tablet (25 mg total) by mouth every 6 (six) hours as needed for anxiety.   Indication:  Anxiety Neurosis, Anxiety     lithium carbonate 450 MG CR tablet  Commonly known as:  ESKALITH  Take 1 tablet (450 mg total) by mouth 2 (two) times daily after a meal.   Indication:  Dyscontrol Syndrome, Schizoaffective Disorder     nicotine 21 mg/24hr patch  Commonly known as:  NICODERM CQ - dosed in mg/24 hours  Place 1 patch (21 mg total) onto the skin daily. For smoking cessation   Indication:  Nicotine Addiction     traZODone 50 MG tablet  Commonly known as:  DESYREL  Take 1 tablet (50 mg total) by mouth at bedtime as needed for sleep.   Indication:  Aggressive Behavior, Drug-Induced Difficulty in Voluntary Movement, Trouble Sleeping       Follow-up Information    Follow up with [facility]. Go in 1 day.   Specialty:  Behavioral Health   Why:  As needed or for your immediate  needs.   Contact information:   [address] [place] [address] [phone]       [facility] Go in 1 day.   Why:  continuation of care for substance abuse.   Contact information:   [address] [place], [place] [address] [phone]      Follow up with [facility]. Call in 1 day.   Why:  follow up and placement for residential Substance Abuse Treatment.   Contact information:   [place], [place] [phone]      Follow up with [facility]. Call in 1 day.   Why:  follow  up for placement in residentail treatment for substance abuse.   Contact information:   [id number] [place],  [address] [phone]    Follow-up recommendations: Activity:  As tolerated Diet: As recommended by your primary care doctor. Keep all scheduled follow-up appointments as recommended.   Comments:  Take all your medications as prescribed by your mental healthcare provider. Report any adverse effects and or reactions from your medicines to your outpatient provider promptly. Patient is instructed and cautioned to not engage in alcohol and or illegal drug use while on prescription medicines. In the event of worsening symptoms, patient is instructed to call the crisis hotline, 911 and or go to the nearest ED for appropriate evaluation and treatment of symptoms. Follow-up with your primary care provider for your other medical issues, concerns and or health care needs.  SignedElmarie Shiley, NP-C 11/18/2015, 2:37 PM

## 2015-11-18 NOTE — ED Notes (Signed)
Pt sleeping. This RN entered room and called pt name in an attempt to arouse him. Pt did not respond. THis Rn gently shook the pt leg with no response, pt continued to snore. Pt name called again a little louder and gently shaking at the same time. Pt aroused. Pt informed that his transportation was here and that it was time for him to go. Pt closed his eyes and began snoring again. This RN called the pt name again and the pt responded LOUDLY "I HEARD YOU." Pt informed that he needed to get out of bed at this time so that he could leave.

## 2015-11-18 NOTE — ED Notes (Signed)
All personal belongs will be given to [place] transportation driver upon arrival

## 2015-11-18 NOTE — Progress Notes (Signed)
Nursing Note  Patient suddenly announcing "I''ll go to Rehab", so Nurse telling him it would be a good idea to take the meds from this morning that he had refused to which patient answers "what does that have to do with anything". Nurse explaining that wherever he may be able to go for Rehab that they will expect him to follow his prescribed medications and take them as they are scheduled. Patient responding with a very low, muttered, "okay", at which time patient has taken all earlier scheduled meds except is still refusing the nicotine patch. Nurse ensuring continuous observation of patient for safety except when pt in bathroom. Patient remains safe on Unit.
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
## 2015-11-18 NOTE — ED Notes (Signed)
Patient is alert and oriented x4. He was recently seen and DC from [facility] for same issue. Patient arrives to the [facility] with [employer] and same complaint.  He wants to kill himself and wants To be admitted to inpatient.

## 2015-11-18 NOTE — Progress Notes (Signed)
This is a 20 years old African American male admitted to the observation unit unit this morning. Patient appeared very rude, unwilling to answer the questions. He stated, "you want me to sit here and answer all these questions at this hour of the day". He called the [facility] that was assisting with the admission paper work stupid. His thought process organized and patient not psychotic. His skin assessment was within normal limit. He endorsed SI and sad he hears voices. Patient did not elaborate on what the voices were telling him to do. He endorsed illegal substance use "Meth". Staff offered patient meal, he refused. Refused to talk about the reason for seeking admission.  Safety maintained.

## 2015-11-18 NOTE — Progress Notes (Signed)
[facility] INPATIENT:  Family/Significant Other Suicide Prevention Education  Suicide Prevention Education:  Patient Refusal for Family/Significant Other Suicide Prevention Education: The patient Justin Chaney has refused to provide written consent for family/significant other to be provided Family/Significant Other Suicide Prevention Education during admission and/or prior to discharge.  Physician notified.  [facility] 11/18/2015, 3:04 AM

## 2015-11-18 NOTE — BH Assessment (Addendum)
Assessment Note  Justin Chaney is an 20 y.o. male presenting to [facility] voluntarily for suicidal ideations with no plan or intent and auditory hallucinations. Patient reports that he was in th observation unit earlier today and was discharged. Patient reports tha he would like to go to [facility] and when asked why, he reports "I need long term treatment." PAtient was very irritable during the assessment and states :listen woman, I don't know why you are asking questions because I am not going to behavioral health. I want to go to the back where everyone has on purple and I get to eat and have my own room with a tv." Patient was very adamant about not being admitted to behavioral health. Patient denies HI and history of being violent towards others. Patient denies access to weapons or firearms. Patient denies visual hallucinations but reports that he has auditory hallucinations, when asked what the voices say, he reports "none of your business" but states "no" when asked if they ever tell him to hurt himself or others. Patient denies pending charges and upcoming court dates. Patient denies active probation. Patient would not answer questions relating to his sleeping and eating patterns. Patient reports recent stressors as "life" and declined to provide further details. Patient endorses symptoms of depression as isolating himself from others but denies other symptoms. Patient denies VH. Patient does not appear to be responding to internal stimuli during the assessment.   Patient is alert and oriented to person, place, and year. Patient states that he is not aware of the month or day. Patient is very angry and irritable throughout the assessment and refuses to answer questions at times. Patient reports that he no longer goes to [facility] for Outpatient treatment and cannot recall the last time he attended treatment. Patient reports that he has used Amphetamines since age 11 and declines to provide other details. Patient  denies use of other drugs and alcohol. Patient UDS +amphetamines and + barbiturates. Patient BAL <5 at time of assessment.  Patient reports that he has attempted several times in the past with the last time being "I drunk some bleach but that shit was nasty so I spit it out" about two weeks ago. Patient has been in an ED 10/10/2015 and admitted to [facility] 10/11/15-317/2017, on 11/06/2015 and admitted to [facility] 11/06/2015- 11/14/2015, on 11/15/2015 and discharged to follow up with outpatient treatment, 11/16/2015 and was admitted to [facility] observation unit 11/17/2015 and was discharged earlier today. Per chart review patient has refused to accept other treatment options and refuses to speak with staff at times.   Consulted with Patriciaann Clan, PA-C who recommends observing patient overnight to discharge by psychiatry in the morning.   Diagnosis: Schizoaffective Disorder  Past Medical History:  Past Medical History  Diagnosis Date  . Asthma   . Seizures ([place])   . Brain ventricular shunt obstruction     hydrochelpis  . Depression   . Bipolar disorder ([place])   . Schizophrenia ([place])   . Homelessness     Past Surgical History  Procedure Laterality Date  . Tonsillectomy    . Ventriculo-peritoneal shunt placement / laparoscopic insertion peritoneal catheter      Family History:  Family History  Problem Relation Age of Onset  . Hypertension Mother   . Mental illness Neg Hx     Social History:  reports that he quit smoking about 3 weeks ago. His smoking use included Cigarettes. He smoked 0.50 packs per day. He does not have any smokeless  tobacco history on file. He reports that he drinks alcohol. He reports that he uses illicit drugs (Marijuana, Other-see comments, and Amphetamines).  Additional Social History:  Alcohol / Drug Use Pain Medications: See PTA Prescriptions: See PTA Over the Counter: See PTA History of alcohol / drug use?: Yes Substance #1 Name of Substance 1: Amphetamines 1 - Age of First Use:  15 1 - Amount (size/oz): UKN 1 - Frequency: "not like that" 1 - Duration: ongoing 1 - Last Use / Amount: one week ago  CIWA: CIWA-Ar BP: 144/77 mmHg Pulse Rate: 87 COWS:    Allergies:  Allergies  Allergen Reactions  . Amoxicillin Hives    .Marland KitchenHas patient had a PCN reaction causing immediate rash, facial/tongue/throat swelling, SOB or lightheadedness with hypotension: unknown Has patient had a PCN reaction causing severe rash involving mucus membranes or skin necrosis:Yes Has patient had a PCN reaction that required hospitalization unknown Has patient had a PCN reaction occurring within the last 10 years: unknown If all of the above answers are "NO", then may proceed with Cephalosporin use.   Marland Kitchen Penicillins Hives and Rash    Has patient had a PCN reaction causing immediate rash, facial/tongue/throat swelling, SOB or lightheadedness with hypotension: unknown Has patient had a PCN reaction causing severe rash involving mucus membranes or skin necrosis: Yes Has patient had a PCN reaction that required hospitalization :unknown Has patient had a PCN reaction occurring within the last 10 years: unknown If all of the above answers are "NO", then may proceed with Cephalosporin use.     Home Medications:  (Not in a hospital admission)  OB/GYN Status:  No LMP for male patient.  General Assessment Data [facility] of [facility]: [facility] Assessment: In system Is this a Tele or Face-to-Face Assessment?: Face-to-Face Is this an Initial Assessment or a Re-assessment for this encounter?: Initial Assessment Marital status: Single Is patient pregnant?: No Pregnancy Status: No Living Arrangements: Other (Comment) (homeless) Can pt return to current living arrangement?: Yes Admission Status: Voluntary Is patient capable of signing voluntary admission?: Yes Referral Source: Self/Family/Friend Insurance type: [place] Living Arrangements: Other (Comment) (homeless) Name of  Psychiatrist: None Name of Therapist: None  Education Status Is patient currently in school?: No Highest grade of school patient has completed: 9th  Risk to self with the past 6 months Suicidal Ideation: Yes-Currently Present Has patient been a risk to self within the past 6 months prior to admission? : Yes Suicidal Intent: No Has patient had any suicidal intent within the past 6 months prior to admission? : Yes Is patient at risk for suicide?: No Suicidal Plan?: No Has patient had any suicidal plan within the past 6 months prior to admission? : Yes Access to Means: No What has been your use of drugs/alcohol within the last 12 months?: Amphetamines Previous Attempts/Gestures: Yes How many times?: 13 Triggers for Past Attempts: Unpredictable Intentional Self Injurious Behavior: Cutting (UKN duration and frequency) Family Suicide History: Unknown Recent stressful life event(s): Other (Comment) ("life") Persecutory voices/beliefs?: No Depression: Yes Depression Symptoms: Isolating Substance abuse history and/or treatment for substance abuse?: Yes Suicide prevention information given to non-admitted patients: Not applicable  Risk to Others within the past 6 months Homicidal Ideation: No Does patient have any lifetime risk of violence toward others beyond the six months prior to admission? : No Thoughts of Harm to Others: No Comment - Thoughts of Harm to Others: Denies Current Homicidal Intent: No Current Homicidal Plan: No  Describe Current Homicidal Plan: Denies Access to Homicidal Means: No History of harm to others?: No Assessment of Violence: None Noted Violent Behavior Description: Denies Does patient have access to weapons?: No Criminal Charges Pending?: No Does patient have a court date: No Is patient on probation?: No  Psychosis Hallucinations: Auditory ("none of your business" not command in nature) Delusions: None noted  Mental Status Report Appearance/Hygiene:  Body odor, In scrubs Eye Contact: Poor Motor Activity: Freedom of movement Speech: Argumentative, Logical/coherent Level of Consciousness: Irritable Mood: Irritable Affect: Angry, Irritable Anxiety Level: None Thought Processes: Coherent, Relevant Orientation: Person, Place, Time, Situation, Appropriate for developmental age Obsessive Compulsive Thoughts/Behaviors: None  Cognitive Functioning Concentration: Decreased Memory: Recent Intact, Remote Intact IQ: Average Insight: Poor Impulse Control: Poor Appetite:  (UTA) Sleep: Unable to Assess Vegetative Symptoms: None  ADLScreening [facility] Assessment Services) Patient's cognitive ability adequate to safely complete daily activities?: Yes Patient able to express need for assistance with ADLs?: Yes Independently performs ADLs?: Yes (appropriate for developmental age)  Prior Inpatient Therapy Prior Inpatient Therapy: Yes Prior Therapy Dates: Multiple Prior Therapy Facilty/Provider(s): [facility], [facility], [facility] Reason for Treatment: Bipolar  Prior Outpatient Therapy Prior Outpatient Therapy: No Prior Therapy Dates: UKN Prior Therapy Facilty/Provider(s): [facility] Reason for Treatment: Schizophrenia Does patient have an ACCT team?: No Does patient have Intensive In-House Services?  : No Does patient have [facility] services? : No Does patient have P4CC services?: No  ADL Screening (condition at time of admission) Patient's cognitive ability adequate to safely complete daily activities?: Yes Is the patient deaf or have difficulty hearing?: No Does the patient have difficulty seeing, even when wearing glasses/contacts?: No Does the patient have difficulty concentrating, remembering, or making decisions?: No Patient able to express need for assistance with ADLs?: Yes Does the patient have difficulty dressing or bathing?: No Independently performs ADLs?: Yes (appropriate for developmental age) Does the patient have difficulty walking or climbing  stairs?: No Weakness of Legs: None Weakness of Arms/Hands: None  Home Assistive Devices/Equipment Home Assistive Devices/Equipment: None    Abuse/Neglect Assessment (Assessment to be complete while patient is alone) Physical Abuse: Denies Verbal Abuse: Yes, past (Comment) ("when I was a kid") Sexual Abuse: Denies Exploitation of patient/patient's resources: Denies Self-Neglect: Denies Values / Beliefs Cultural Requests During Hospitalization: None Spiritual Requests During Hospitalization: None Consults Spiritual Care Consult Needed: No Social Work Consult Needed: No Regulatory affairs officer (For Healthcare) Does patient have an advance directive?: No Would patient like information on creating an advanced directive?: No - patient declined information    Additional Information 1:1 In Past 12 Months?: No CIRT Risk: No Elopement Risk: No Does patient have medical clearance?: Yes     Disposition:  Disposition Initial Assessment Completed for this Encounter: Yes Disposition of Patient: Other dispositions (observe overnight per Patriciaann Clan, PA-C) Other disposition(s): Other (Comment)  On Site Evaluation by:   Reviewed with Physician:    Kesa Birky 11/18/2015 11:22 PM

## 2015-11-18 NOTE — BHH Counselor (Signed)
Per Hoyle Sauer at [facility] patient was declined for referral due to acuity. Justin Chaney, LPC-A, [facility]  Counselor 11/18/2015 12:44 PM

## 2015-11-18 NOTE — BHH Counselor (Signed)
Pt was denying help earlier this a.m.  and spoke with Mickel Baas, NP and Dr. Dwyane Dee regarding pt disposition and continuation of care. Pt at time refused help and Residential treatment/ S.A. treatment was offered and given as needed treatment option. Later this a.m. Pt. spoke to this writer and expressed that he was willing to accept residential S.A./ S.A. Referral for continuation of care.  Pt refused yet was offered treatment options outside of local areas. Pt also declined [place] [facility] or any treatment in [place] area. Analaura Messler K. Nash Shearer, LPC-A, [facility]  Counselor 11/18/2015 12:14 PM

## 2015-11-18 NOTE — Progress Notes (Signed)
This patient has been noted to have been discharged from the [facility] observation unit today.  Patient left without his AVS per chart review.

## 2015-11-18 NOTE — Progress Notes (Signed)
Nursing Shift Note  Patient very sullen, reluctant to answer even the most basic of questions, and when he does answer the Proiders it is either a useless response or a flat out "no" in being cooperative to their questions/requests, and patient has also refused to take any of his 0800 and 0900 meds. Nurse will continue to provide emotional support and therapeutic communication as patient allows, and also ensure continuous obrservation for safety except when patient in bathroom. Patient in bed, remains safe on Unit.

## 2015-11-19 DIAGNOSIS — R45851 Suicidal ideations: Secondary | ICD-10-CM | POA: Insufficient documentation

## 2015-11-19 DIAGNOSIS — F1520 Other stimulant dependence, uncomplicated: Secondary | ICD-10-CM

## 2015-11-19 NOTE — ED Notes (Signed)
Patient discharged to home.  He was given a bus pass and escorted off the unit by [employer] and hospital security.  Notified Charlann Boxer, charge nurse in the ED.  Patient continues to state he needs long term treatment and does not seem to understand that outpatient services are available for him.

## 2015-11-19 NOTE — Discharge Instructions (Signed)
For your ongoing behavioral health needs you are advised to follow up with one of the following providers.  Call them at your earliest opportunity to schedule an appointment:       [facility] [place], [place] [address]      [phone]       [facility]      [address]      [place], [place] [address]      [phone]       [facility] [place]      [address]      [place], [place] [address]      [phone]

## 2015-11-19 NOTE — BH Assessment (Signed)
[place] Assessment Progress Note  Per Corena Pilgrim, MD, this pt does not require psychiatric hospitalization at this time.  Pt is to be discharged from [facility] with outpatient referrals.  Discharge instructions advise pt to follow up with one of the following practices:  [facility] [place], [name], has been notified.  Jalene Mullet, [place] Triage Specialist [phone]

## 2015-11-19 NOTE — Consult Note (Signed)
[facility] Face-to-Face Psychiatry Consult   Reason for Consult:  Suicide ideation with no plans, homelessness Referring Physician:  EDP Patient Identification: Justin Chaney MRN:  [id number] Principal Diagnosis: Amphetamine use disorder, severe, dependence ([place]) Diagnosis:   Patient Active Problem List   Diagnosis Date Noted  . Hallucinations [R44.3]   . Homicidal ideation [R45.850]   . Suicidal thoughts [R45.851]   . Cocaine use disorder, severe, dependence ([place]) [F14.20] 11/07/2015  . Amphetamine use disorder, severe, dependence ([place]) [F15.20] 11/07/2015  . Cannabis use disorder, moderate, in sustained remission [F12.90] 11/07/2015  . Intermittent explosive disorder [F63.81] 09/05/2015  . Schizoaffective disorder, bipolar type ([place]) [F25.0] 09/04/2015  . Non compliance w medication regimen [Z91.14] 01/17/2015    Total Time spent with patient: 45 minutes  Subjective:   Justin Chaney is a 20 y.o. male patient admitted with suicide ideation, Homelessness.Marland Kitchen  HPI: AA male, 20 years old was evaluated for suicide ideation with no plans.  This is his 5th visit in two weeks.  He was discharged from [facility] yesterday and he came back to the ER with c/o suicide ideation.  Patient states today that he does not want to go home but rather go to [facility].  He states that he is homeless and that [employer] will give him some accomodation and treatment.  Today he denies SI/HI/AVH but states that he need a long term treatment facility.  Patient is discharged home to follow up with Mental health facility .[facility], [facility] or [facility].  Past Psychiatric History: Schizoaffective Disorder, Bipolar type, Cannabis use disorder, Intermittent explosive disorder  Risk to Self: Suicidal Ideation: Yes-Currently Present Suicidal Intent: No Is patient at risk for suicide?: No Suicidal Plan?: No Access to Means: No What has been your use of drugs/alcohol  within the last 12 months?: Amphetamines How many times?: 13 Triggers for Past Attempts: Unpredictable Intentional Self Injurious Behavior: Cutting (UKN duration and frequency) Risk to Others: Homicidal Ideation: No Thoughts of Harm to Others: No Comment - Thoughts of Harm to Others: Denies Current Homicidal Intent: No Current Homicidal Plan: No Describe Current Homicidal Plan: Denies Access to Homicidal Means: No History of harm to others?: No Assessment of Violence: None Noted Violent Behavior Description: Denies Does patient have access to weapons?: No Criminal Charges Pending?: No Does patient have a court date: No Prior Inpatient Therapy: Prior Inpatient Therapy: Yes Prior Therapy Dates: Multiple Prior Therapy Facilty/Provider(s): [facility], [facility], [facility] Reason for Treatment: Bipolar Prior Outpatient Therapy: Prior Outpatient Therapy: No Prior Therapy Dates: UKN Prior Therapy Facilty/Provider(s): [facility] Reason for Treatment: Schizophrenia Does patient have an ACCT team?: No Does patient have Intensive In-House Services?  : No Does patient have [facility] services? : No Does patient have P4CC services?: No  Past Medical History:  Past Medical History  Diagnosis Date  . Asthma   . Seizures ([place])   . Brain ventricular shunt obstruction     hydrochelpis  . Depression   . Bipolar disorder ([place])   . Schizophrenia ([place])   . Homelessness     Past Surgical History  Procedure Laterality Date  . Tonsillectomy    . Ventriculo-peritoneal shunt placement / laparoscopic insertion peritoneal catheter     Family History:  Family History  Problem Relation Age of Onset  . Hypertension Mother   . Mental illness Neg Hx    Family Psychiatric  History:  Denies Social History:  History  Alcohol Use  . Yes     History  Drug  Use  . Yes  . Special: Marijuana, Other-see comments, Amphetamines    Comment: Adderall, "I don't do weed, only rarely"    Social History   Social History  .  Marital Status: Single    Spouse Name: N/A  . Number of Children: N/A  . Years of Education: N/A   Social History Main Topics  . Smoking status: Former Smoker -- 0.50 packs/day    Types: Cigarettes    Quit date: 10/22/2015  . Smokeless tobacco: None  . Alcohol Use: Yes  . Drug Use: Yes    Special: Marijuana, Other-see comments, Amphetamines     Comment: Adderall, "I don't do weed, only rarely"  . Sexual Activity: Not Asked   Other Topics Concern  . None   Social History Narrative   Additional Social History:    Allergies:   Allergies  Allergen Reactions  . Amoxicillin Hives    .Marland KitchenHas patient had a PCN reaction causing immediate rash, facial/tongue/throat swelling, SOB or lightheadedness with hypotension: unknown Has patient had a PCN reaction causing severe rash involving mucus membranes or skin necrosis:Yes Has patient had a PCN reaction that required hospitalization unknown Has patient had a PCN reaction occurring within the last 10 years: unknown If all of the above answers are "NO", then may proceed with Cephalosporin use.   Marland Kitchen Penicillins Hives and Rash    Has patient had a PCN reaction causing immediate rash, facial/tongue/throat swelling, SOB or lightheadedness with hypotension: unknown Has patient had a PCN reaction causing severe rash involving mucus membranes or skin necrosis: Yes Has patient had a PCN reaction that required hospitalization :unknown Has patient had a PCN reaction occurring within the last 10 years: unknown If all of the above answers are "NO", then may proceed with Cephalosporin use.     Labs:  Results for orders placed or performed during the hospital encounter of 11/18/15 (from the past 48 hour(s))  Comprehensive metabolic panel     Status: Abnormal   Collection Time: 11/18/15  8:23 PM  Result Value Ref Range   Sodium 136 135 - 145 mmol/L   Potassium 3.6 3.5 - 5.1 mmol/L   Chloride 102 101 - 111 mmol/L   CO2 24 22 - 32 mmol/L   Glucose,  Bld 132 (H) 65 - 99 mg/dL   BUN 10 6 - 20 mg/dL   Creatinine, Ser 1.00 0.61 - 1.24 mg/dL   Calcium 8.9 8.9 - 10.3 mg/dL   Total Protein 7.0 6.5 - 8.1 g/dL   Albumin 4.0 3.5 - 5.0 g/dL   AST 33 15 - 41 U/L   ALT 38 17 - 63 U/L   Alkaline Phosphatase 70 38 - 126 U/L   Total Bilirubin 1.4 (H) 0.3 - 1.2 mg/dL   GFR calc non Af Amer >60 >60 mL/min   GFR calc Af Amer >60 >60 mL/min    Comment: (NOTE) The eGFR has been calculated using the CKD EPI equation. This calculation has not been validated in all clinical situations. eGFR's persistently <60 mL/min signify possible Chronic Kidney Disease.    Anion gap 10 5 - 15  Ethanol (ETOH)     Status: None   Collection Time: 11/18/15  8:23 PM  Result Value Ref Range   Alcohol, Ethyl (B) <5 <5 mg/dL    Comment:        LOWEST DETECTABLE LIMIT FOR SERUM ALCOHOL IS 5 mg/dL FOR MEDICAL PURPOSES ONLY   Salicylate level     Status: None  Collection Time: 11/18/15  8:23 PM  Result Value Ref Range   Salicylate Lvl <1.1 2.8 - 30.0 mg/dL  Acetaminophen level     Status: Abnormal   Collection Time: 11/18/15  8:23 PM  Result Value Ref Range   Acetaminophen (Tylenol), Serum <10 (L) 10 - 30 ug/mL    Comment:        THERAPEUTIC CONCENTRATIONS VARY SIGNIFICANTLY. A RANGE OF 10-30 ug/mL MAY BE AN EFFECTIVE CONCENTRATION FOR MANY PATIENTS. HOWEVER, SOME ARE BEST TREATED AT CONCENTRATIONS OUTSIDE THIS RANGE. ACETAMINOPHEN CONCENTRATIONS >150 ug/mL AT 4 HOURS AFTER INGESTION AND >50 ug/mL AT 12 HOURS AFTER INGESTION ARE OFTEN ASSOCIATED WITH TOXIC REACTIONS.   CBC     Status: None   Collection Time: 11/18/15  8:23 PM  Result Value Ref Range   WBC 5.8 4.0 - 10.5 K/uL   RBC 5.25 4.22 - 5.81 MIL/uL   Hemoglobin 14.7 13.0 - 17.0 g/dL   HCT 42.0 39.0 - 52.0 %   MCV 80.0 78.0 - 100.0 fL   MCH 28.0 26.0 - 34.0 pg   MCHC 35.0 30.0 - 36.0 g/dL   RDW 11.7 11.5 - 15.5 %   Platelets 172 150 - 400 K/uL  Urine rapid drug screen (hosp performed) (Not  at [facility])     Status: Abnormal   Collection Time: 11/18/15  9:19 PM  Result Value Ref Range   Opiates NONE DETECTED NONE DETECTED   Cocaine NONE DETECTED NONE DETECTED   Benzodiazepines NONE DETECTED NONE DETECTED   Amphetamines POSITIVE (A) NONE DETECTED   Tetrahydrocannabinol NONE DETECTED NONE DETECTED   Barbiturates POSITIVE (A) NONE DETECTED    Comment:        DRUG SCREEN FOR MEDICAL PURPOSES ONLY.  IF CONFIRMATION IS NEEDED FOR ANY PURPOSE, NOTIFY LAB WITHIN 5 DAYS.        LOWEST DETECTABLE LIMITS FOR URINE DRUG SCREEN Drug Class       Cutoff (ng/mL) Amphetamine      1000 Barbiturate      200 Benzodiazepine   572 Tricyclics       620 Opiates          300 Cocaine          300 THC              50     Current Facility-Administered Medications  Medication Dose Route Frequency Provider Last Rate Last Dose  . benztropine (COGENTIN) tablet 0.5 mg  0.5 mg Oral BID [facility] Ward, PA-C   0.5 mg at 11/19/15 3559  . fluPHENAZine (PROLIXIN) tablet 10 mg  10 mg Oral QHS [facility] Ward, PA-C   10 mg at 11/18/15 2257  . fluPHENAZine (PROLIXIN) tablet 5 mg  5 mg Oral Daily [facility] Ward, PA-C   5 mg at 11/19/15 0919  . lithium carbonate (ESKALITH) CR tablet 450 mg  450 mg Oral BID PC [facility] Ward, PA-C   450 mg at 11/19/15 0919  . LORazepam (ATIVAN) tablet 1 mg  1 mg Oral Once PRN [facility] Ward, PA-C      . traZODone (DESYREL) tablet 50 mg  50 mg Oral QHS PRN [facility] Ward, PA-C       Current Outpatient Prescriptions  Medication Sig Dispense Refill  . benztropine (COGENTIN) 0.5 MG tablet Take 1 tablet (0.5 mg total) by mouth 2 (two) times daily. 60 tablet 0  . fluPHENAZine (PROLIXIN) 10 MG tablet Take 1 tablet (10 mg total) by mouth at bedtime.  30 tablet 0  . fluPHENAZine (PROLIXIN) 5 MG tablet Take 1 tablet (5 mg total) by mouth daily. 30 tablet 0  . lithium carbonate (ESKALITH) 450 MG CR tablet Take 1 tablet (450 mg total) by mouth 2 (two) times daily  after a meal. 60 tablet 0  . traZODone (DESYREL) 50 MG tablet Take 1 tablet (50 mg total) by mouth at bedtime as needed for sleep.    . hydrOXYzine (ATARAX/VISTARIL) 25 MG tablet Take 1 tablet (25 mg total) by mouth every 6 (six) hours as needed for anxiety. (Patient not taking: Reported on 11/16/2015) 30 tablet 0  . nicotine (NICODERM CQ - DOSED IN MG/24 HOURS) 21 mg/24hr patch Place 1 patch (21 mg total) onto the skin daily. For smoking cessation (Patient not taking: Reported on 11/16/2015) 28 patch 0    Musculoskeletal: Strength & Muscle Tone: within normal limits Gait & Station: normal Patient leans: N/A  Psychiatric Specialty Exam: Review of Systems  Constitutional: Negative.   HENT: Negative.   Eyes: Negative.   Respiratory: Negative.   Cardiovascular: Negative.   Gastrointestinal: Negative.   Genitourinary: Negative.   Musculoskeletal: Negative.   Skin: Negative.   Neurological: Negative.   Endo/Heme/Allergies: Negative.     Blood pressure 106/56, pulse 101, temperature 98.7 F (37.1 C), temperature source Oral, resp. rate 17, SpO2 100 %.There is no weight on file to calculate BMI.  General Appearance: Casual and Fairly Groomed  Engineer, water::  Good  Speech:  Clear and Coherent and Normal Rate  Volume:  Normal  Mood:  Euthymic  Affect:  Congruent  Thought Process:  Coherent, Goal Directed and Intact  Orientation:  Full (Time, Place, and Person)  Thought Content:  WDL  Suicidal Thoughts:  No  Homicidal Thoughts:  No  Memory:  Immediate;   Good Recent;   Good Remote;   Good  Judgement:  Good  Insight:  Good  Psychomotor Activity:  Normal  Concentration:  Good  Recall:  NA  Fund of Knowledge:Good  Language: Good  Akathisia:  NA  Handed:  Right  AIMS (if indicated):     Assets:  Desire for Improvement  ADL's:  Intact  Cognition: WNL  Sleep:       Disposition: Discharge home, follow up with [facility] [place], NP   PMHNP-BC 11/19/2015 11:41 AM Patient seen face-to-face for psychiatric evaluation, chart reviewed and case discussed with the physician extender and developed treatment plan. Reviewed the information documented and agree with the treatment plan. Corena Pilgrim, MD

## 2015-11-19 NOTE — ED Notes (Signed)
Justin Chaney is quite irritable this morning.  States he wants to go for long term treatment.  Was discharged from Obs unit yesterday and states he was suicidal at the time and left there to come here because he was told they could not offer him "what I need."  He was offered residential treatment and outpatient mental health services.  Also, when patient was recently at [facility] on the inpatient unit he was not attending groups or taking medications on a regular basis.  He insists he needs to go to [facility].  When told he really doesn't meet criteria at this time he insists that he does.

## 2015-11-19 NOTE — ED Notes (Signed)
Patient admits to [facility] with no plan. Patient denies HI and AVH at this time. Patient is irritable and has blunt affect. Plan of care discussed and patient voices no complaint or concern at this time. Encouragement and support provided and safety maintain. Q 15 min safety checks remain in place.

## 2015-11-19 NOTE — [facility] Suicide Risk Assessment (Cosign Needed)
Suicide Risk Assessment  Discharge Assessment   [facility] Discharge Suicide Risk Assessment   Principal Problem: Amphetamine use disorder, severe, dependence [facility]) Discharge Diagnoses:  Patient Active Problem List   Diagnosis Date Noted  . Hallucinations [R44.3]   . Homicidal ideation [R45.850]   . Suicidal thoughts [R45.851]   . Cocaine use disorder, severe, dependence ([place]) [F14.20] 11/07/2015  . Amphetamine use disorder, severe, dependence ([place]) [F15.20] 11/07/2015  . Cannabis use disorder, moderate, in sustained remission [F12.90] 11/07/2015  . Intermittent explosive disorder [F63.81] 09/05/2015  . Schizoaffective disorder, bipolar type ([place]) [F25.0] 09/04/2015  . Non compliance w medication regimen [Z91.14] 01/17/2015    Total Time spent with patient: 20 minutes  Musculoskeletal: Strength & Muscle Tone: within normal limits Gait & Station: normal Patient leans: N/A  Psychiatric Specialty Exam:   Blood pressure 106/56, pulse 101, temperature 98.7 F (37.1 C), temperature source Oral, resp. rate 17, SpO2 100 %.There is no weight on file to calculate BMI.  General Appearance: Casual and Fairly Groomed  Engineer, water::  Good  Speech:  Clear and Coherent and Normal Rate  Volume:  Normal  Mood:  Euthymic  Affect:  Congruent  Thought Process:  Coherent, Goal Directed and Intact  Orientation:  Full (Time, Place, and Person)  Thought Content:  WDL  Suicidal Thoughts:  No  Homicidal Thoughts:  No  Memory:  Immediate;   Good Recent;   Good Remote;   Good  Judgement:  Good  Insight:  Good  Psychomotor Activity:  Normal  Concentration:  Good  Recall:  NA  Fund of Knowledge:Good  Language: Good  Akathisia:  NA  Handed:  Right  AIMS (if indicated):     Assets:  Desire for Improvement  ADL's:  Intact  Cognition: WNL     Mental Status Per Nursing Assessment::   On Admission:     Demographic Factors:  Male, Adolescent or young adult, Low socioeconomic status and  Unemployed  Loss Factors: NA  Historical Factors: NA  Risk Reduction Factors:   Positive social support  Continued Clinical Symptoms:  Alcohol/Substance Abuse/Dependencies  Cognitive Features That Contribute To Risk:  Polarized thinking    Suicide Risk:  Minimal: No identifiable suicidal ideation.  Patients presenting with no risk factors but with morbid ruminations; may be classified as minimal risk based on the severity of the depressive symptoms    Plan Of Care/Follow-up recommendations:  Activity:  as tolerated Diet:  regular  Delfin Gant, NP     PMHNP-BC 11/19/2015, 12:10 PM

## 2016-01-12 ENCOUNTER — Encounter (HOSPITAL_COMMUNITY): Payer: Self-pay | Admitting: *Deleted

## 2016-01-12 ENCOUNTER — Emergency Department (HOSPITAL_COMMUNITY)
Admission: EM | Admit: 2016-01-12 | Discharge: 2016-01-13 | Disposition: A | Discharge status: Home / Self Care | Payer: BLUE CROSS/BLUE SHIELD | Attending: Emergency Medicine | Admitting: Emergency Medicine

## 2016-01-12 DIAGNOSIS — F6381 Intermittent explosive disorder: Secondary | ICD-10-CM | POA: Diagnosis not present

## 2016-01-12 DIAGNOSIS — F319 Bipolar disorder, unspecified: Secondary | ICD-10-CM | POA: Insufficient documentation

## 2016-01-12 DIAGNOSIS — T1491 Suicide attempt: Secondary | ICD-10-CM | POA: Diagnosis not present

## 2016-01-12 DIAGNOSIS — Z87891 Personal history of nicotine dependence: Secondary | ICD-10-CM | POA: Diagnosis not present

## 2016-01-12 DIAGNOSIS — Z79899 Other long term (current) drug therapy: Secondary | ICD-10-CM | POA: Diagnosis not present

## 2016-01-12 DIAGNOSIS — R45851 Suicidal ideations: Secondary | ICD-10-CM

## 2016-01-12 DIAGNOSIS — F250 Schizoaffective disorder, bipolar type: Secondary | ICD-10-CM | POA: Diagnosis not present

## 2016-01-12 DIAGNOSIS — J45909 Unspecified asthma, uncomplicated: Secondary | ICD-10-CM | POA: Diagnosis not present

## 2016-01-12 LAB — RAPID URINE DRUG SCREEN, HOSP PERFORMED
Amphetamines: NOT DETECTED
Barbiturates: NOT DETECTED
Benzodiazepines: NOT DETECTED
Cocaine: NOT DETECTED
Opiates: NOT DETECTED
Tetrahydrocannabinol: NOT DETECTED

## 2016-01-12 LAB — COMPREHENSIVE METABOLIC PANEL
ALT: 32 U/L (ref 17–63)
AST: 28 U/L (ref 15–41)
Albumin: 4.7 g/dL (ref 3.5–5.0)
Alkaline Phosphatase: 68 U/L (ref 38–126)
Anion gap: 8 (ref 5–15)
BUN: 10 mg/dL (ref 6–20)
CO2: 22 mmol/L (ref 22–32)
Calcium: 9.3 mg/dL (ref 8.9–10.3)
Chloride: 109 mmol/L (ref 101–111)
Creatinine, Ser: 0.75 mg/dL (ref 0.61–1.24)
GFR calc Af Amer: >60 mL/min (ref >60)
GFR calc non Af Amer: >60 mL/min (ref >60)
Glucose, Bld: 99 mg/dL (ref 65–99)
Potassium: 3.8 mmol/L (ref 3.5–5.1)
Sodium: 139 mmol/L (ref 135–145)
Total Bilirubin: 0.9 mg/dL (ref 0.3–1.2)
Total Protein: 7.5 g/dL (ref 6.5–8.1)

## 2016-01-12 LAB — LITHIUM LEVEL: Lithium Lvl: <0.06 mmol/L — ABNORMAL LOW (ref 0.60–1.20)

## 2016-01-12 LAB — ACETAMINOPHEN LEVEL: Acetaminophen (Tylenol), Serum: <10 ug/mL — ABNORMAL LOW (ref 10–30)

## 2016-01-12 LAB — CBC
HCT: 44.6 % (ref 39.0–52.0)
Hemoglobin: 15.7 g/dL (ref 13.0–17.0)
MCH: 28.1 pg (ref 26.0–34.0)
MCHC: 35.2 g/dL (ref 30.0–36.0)
MCV: 79.9 fL (ref 78.0–100.0)
Platelets: 248 10*3/uL (ref 150–400)
RBC: 5.58 MIL/uL (ref 4.22–5.81)
RDW: 12.2 % (ref 11.5–15.5)
WBC: 5.6 10*3/uL (ref 4.0–10.5)

## 2016-01-12 LAB — ETHANOL: Alcohol, Ethyl (B): <5 mg/dL (ref <5)

## 2016-01-12 LAB — SALICYLATE LEVEL: Salicylate Lvl: <4 mg/dL (ref 2.8–30.0)

## 2016-01-12 MED ORDER — LITHIUM CARBONATE ER 450 MG PO TBCR
450.0000 mg | EXTENDED_RELEASE_TABLET | Freq: Two times a day (BID) | ORAL | Status: DC
  Administered 2016-01-12 – 2016-01-13 (×3): 450 mg via ORAL
  Filled 2016-01-12 (×5): qty 1

## 2016-01-12 MED ORDER — HYDROXYZINE HCL 25 MG PO TABS: 25.0000 mg | ORAL_TABLET | Freq: Four times a day (QID) | ORAL | Status: DC | PRN

## 2016-01-12 MED ORDER — TRAZODONE HCL 50 MG PO TABS: 50.0000 mg | ORAL_TABLET | Freq: Every evening | ORAL | Status: DC | PRN

## 2016-01-12 MED ORDER — BENZTROPINE MESYLATE 1 MG PO TABS
0.5000 mg | ORAL_TABLET | Freq: Two times a day (BID) | ORAL | Status: DC
  Administered 2016-01-12 – 2016-01-13 (×2): 0.5 mg via ORAL
  Filled 2016-01-12 (×2): qty 1

## 2016-01-12 MED ORDER — FLUPHENAZINE HCL 10 MG PO TABS
10.0000 mg | ORAL_TABLET | Freq: Every day | ORAL | Status: DC
  Administered 2016-01-12: 10 mg via ORAL
  Filled 2016-01-12 (×3): qty 1

## 2016-01-12 MED ORDER — FLUPHENAZINE HCL 5 MG PO TABS
5.0000 mg | ORAL_TABLET | Freq: Every day | ORAL | Status: DC
  Administered 2016-01-13: 5 mg via ORAL
  Filled 2016-01-12 (×2): qty 1

## 2016-01-12 NOTE — ED Provider Notes (Signed)
CSN: [id number]     Arrival date & time 01/12/16  1327 History   First MD Initiated Contact with Patient 01/12/16 1503     Chief Complaint  Patient presents with  . Suicidal  . voluntary      (Consider location/radiation/quality/duration/timing/severity/associated sxs/prior Treatment) Patient is a 20 y.o. male presenting with mental health disorder. The history is provided by the patient.  Mental Health Problem Presenting symptoms: depression, suicidal thoughts and suicidal threats   Degree of incapacity (severity):  Moderate Onset quality:  Gradual Duration:  2 weeks (of being suicidal) Progression:  Worsening Context comment:  Held knife threatening to slash his throat earlier tonight while crying but was calmed down by sister and they took away the weapon and called police Treatment compliance:  None of the time Time since last psychoactive medication taken:  1 month Relieved by:  Nothing Worsened by:  Nothing tried Ineffective treatments:  None tried Associated symptoms: irritability and poor judgment   Risk factors: hx of mental illness and hx of suicide attempts ("all kinds of ways")     Past Medical History  Diagnosis Date  . Asthma   . Seizures ([place])   . Brain ventricular shunt obstruction     hydrochelpis  . Depression   . Bipolar disorder ([place])   . Schizophrenia ([place])   . Homelessness    Past Surgical History  Procedure Laterality Date  . Tonsillectomy    . Ventriculo-peritoneal shunt placement / laparoscopic insertion peritoneal catheter     Family History  Problem Relation Age of Onset  . Hypertension Mother   . Mental illness Neg Hx    Social History  Substance Use Topics  . Smoking status: Former Smoker -- 0.50 packs/day    Types: Cigarettes    Quit date: 10/22/2015  . Smokeless tobacco: None  . Alcohol Use: Yes    Review of Systems  Constitutional: Positive for irritability.  Psychiatric/Behavioral: Positive for suicidal ideas.  All other  systems reviewed and are negative.     Allergies  Amoxicillin and Penicillins  Home Medications   Prior to Admission medications   Medication Sig Start Date End Date Taking? Authorizing Provider  benztropine (COGENTIN) 0.5 MG tablet Take 1 tablet (0.5 mg total) by mouth 2 (two) times daily. 11/18/15   Niel Hummer, NP  fluPHENAZine (PROLIXIN) 10 MG tablet Take 1 tablet (10 mg total) by mouth at bedtime. 11/18/15   Niel Hummer, NP  fluPHENAZine (PROLIXIN) 5 MG tablet Take 1 tablet (5 mg total) by mouth daily. 11/18/15   Niel Hummer, NP  hydrOXYzine (ATARAX/VISTARIL) 25 MG tablet Take 1 tablet (25 mg total) by mouth every 6 (six) hours as needed for anxiety. Patient not taking: Reported on 11/16/2015 11/13/15   Benjamine Mola, FNP  lithium carbonate (ESKALITH) 450 MG CR tablet Take 1 tablet (450 mg total) by mouth 2 (two) times daily after a meal. 11/18/15   Niel Hummer, NP  nicotine (NICODERM CQ - DOSED IN MG/24 HOURS) 21 mg/24hr patch Place 1 patch (21 mg total) onto the skin daily. For smoking cessation Patient not taking: Reported on 11/16/2015 11/13/15   Benjamine Mola, FNP  traZODone (DESYREL) 50 MG tablet Take 1 tablet (50 mg total) by mouth at bedtime as needed for sleep. 11/18/15   Niel Hummer, NP   BP 118/79 mmHg  Pulse 83  Temp(Src) 98.1 F (36.7 C) (Oral)  Resp 16  SpO2 99% Physical Exam  Constitutional: He is  oriented to person, place, and time. He appears well-developed and well-nourished. No distress.  HENT:  Head: Normocephalic and atraumatic.  Eyes: Conjunctivae are normal.  Neck: Neck supple. No tracheal deviation present.  Cardiovascular: Normal rate and regular rhythm.   Pulmonary/Chest: Effort normal. No respiratory distress.  Abdominal: Soft. He exhibits no distension.  Neurological: He is alert and oriented to person, place, and time.  Skin: Skin is warm and dry.  Psychiatric: He exhibits a depressed mood. He expresses suicidal ideation. He expresses  suicidal plans.    ED Course  Procedures (including critical care time) Labs Review Labs Reviewed  ACETAMINOPHEN LEVEL - Abnormal; Notable for the following:    Acetaminophen (Tylenol), Serum <10 (*)    All other components within normal limits  LITHIUM LEVEL - Abnormal; Notable for the following:    Lithium Lvl <0.06 (*)    All other components within normal limits  COMPREHENSIVE METABOLIC PANEL  ETHANOL  SALICYLATE LEVEL  CBC  URINE RAPID DRUG SCREEN, HOSP PERFORMED    Imaging Review No results found. I have personally reviewed and evaluated these images and lab results as part of my medical decision-making.   EKG Interpretation None      MDM   Final diagnoses:  Suicidal ideations     20 year old male with history of schizophrenia and bipolar presents after holding a knife to his neck earlier this evening and threatening to commit suicide in front of his sister. He states that he has been dealing with ongoing depression and has been off medications for the last month. He has been feeling increasingly suicidal over the last 2 weeks.he appears depressed here and is still endorsing suicidal thoughts. TTS was consulted for voluntary evaluation. Patient is questioning when he will be able to leave but TTS is recommending reevaluation in the morning so patient was placed under IVC as he was felt to be a threat to his own safety potentially until he completes his psychiatric evaluation.    Leo Grosser, MD 01/12/16 2107

## 2016-01-12 NOTE — ED Notes (Signed)
Patient belongings are 1 bag of items at Triage nursing station. Patient and belongings have been wanded by security. Patient has his notebook at bedside.
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
## 2016-01-12 NOTE — Progress Notes (Signed)
Pt with [facility] 9 ED visits and 3 admissions in the last 6 months  Last [facility] d/c Date of Admission: 11/18/2015  Date of Discharge: 04/18//2017 to observation   Reason for Admission: Worsening symptoms of depression triggering suicidal ideations.  Principal Problem: Schizoaffective disorder, bipolar type ([place]) Pt recommended to f/u with [facility], [facility], [facility] and [facility] Pt non compliant with f/u recommended  Pt returned to [facility] ED after d/c from Observation unit on 11/18/15 pm  No ED CP No pcp listed for f/u care
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
## 2016-01-12 NOTE — ED Notes (Signed)
Patient appears flat, drowsy. Denies HI. Reports SI, AH.   Encouragement offered. Lithium given.  Q15 safety checks in place.

## 2016-01-13 DIAGNOSIS — F6381 Intermittent explosive disorder: Secondary | ICD-10-CM

## 2016-01-13 DIAGNOSIS — F250 Schizoaffective disorder, bipolar type: Secondary | ICD-10-CM | POA: Diagnosis not present

## 2016-01-13 DIAGNOSIS — R45851 Suicidal ideations: Secondary | ICD-10-CM | POA: Diagnosis not present

## 2016-01-13 MED ORDER — FLUPHENAZINE HCL 10 MG PO TABS
10.0000 mg | ORAL_TABLET | Freq: Two times a day (BID) | ORAL | Status: DC
  Administered 2016-01-13: 10 mg via ORAL
  Filled 2016-01-13 (×2): qty 1

## 2016-01-13 MED ORDER — OLANZAPINE 10 MG PO TBDP: 10.0000 mg | ORAL_TABLET | Freq: Three times a day (TID) | ORAL | Status: DC | PRN

## 2016-01-13 NOTE — ED Notes (Signed)
Pt AAO x 3, resting in bed at present, no distress noted, calm & cooperative.  Monitoring for safety, Q 15 min checks i effect.  Pending [facility] transport to [facility].

## 2016-01-13 NOTE — Progress Notes (Addendum)
This pt has been seen by ED CMs x 2 in February and April 2017 a total of 4 times and offered pcp resources but not compliant in f/u with pcp services  Entered in d/c instructions  Please verify any provider recommended to you is in network Call As needed Please go to [email or web address], locate find a doctor area in top right corner of page to use to find in network primary care provider and specialists or call the toll free number on the back of your insurance card to speak with a customer service staff
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
## 2016-01-13 NOTE — Progress Notes (Signed)
This writer completed a chart review for disposition.    Clessie Karras, MSW, LCSW, LCAS [facility] Triage Specialist [phone] 

## 2016-01-13 NOTE — BH Assessment (Signed)
[facility] Assessment Progress Note  Per Corena Pilgrim, MD, this pt requires psychiatric hospitalization at this time.  He presents under IVC initiated by EDP Leo Grosser, MD.  At 14:38 Nolberto Hanlon calls from [facility] to report that pt has been accepted to their facility by Dr Selinda Flavin.  EDP Daleen Bo, MD concurs with this decision.  Pt's nurse, Otho Perl, has been notified, and agrees to call report by dialing [phone] and asking for Admissions.  Pt is to be transported via [facility].  Jalene Mullet, [place] Triage Specialist [phone]

## 2016-01-13 NOTE — Consult Note (Signed)
[facility] Face-to-Face Psychiatry Consult   Reason for Consult:  Suicidal attempt by putting a knife to his throat, depression, mood lability Referring Physician:  EDP Patient Identification: Asahd Can MRN:  [id number] Principal Diagnosis: Schizoaffective disorder, bipolar type ([place]) Diagnosis:   Patient Active Problem List   Diagnosis Date Noted  . Intermittent explosive disorder [F63.81] 09/05/2015    Priority: High  . Schizoaffective disorder, bipolar type ([place]) [F25.0] 09/04/2015    Priority: High  . Suicidal ideations [R45.851]   . Hallucinations [R44.3]   . Homicidal ideation [R45.850]   . Suicidal thoughts [R45.851]   . Cocaine use disorder, severe, dependence ([place]) [F14.20] 11/07/2015  . Amphetamine use disorder, severe, dependence ([place]) [F15.20] 11/07/2015  . Cannabis use disorder, moderate, in sustained remission [F12.90] 11/07/2015  . Non compliance w medication regimen [Z91.14] 01/17/2015    Total Time spent with patient: 45 minutes  Subjective:   Cristan Zorn is a 20 y.o. male patient admitted with mood swings and suicidal ideations.  HPI:  20 year old male with history of Schizoaffective disorder, intermittent explosive disorder and substance abuse. He was brought to [facility] for evaluation after he attempted suicide by putting a knife to his neck and threatened to commit suicide in front of his sister. Patient reports that he has not been compliant with his medications and has  been dealing with depression, mood swings, explosive anger, racing thoughts, recurrent suicidal ideations and psychosis. Patient reports hearing voices and seeing shadows.  Past Psychiatric History: as abiove  Risk to Self: Suicidal Ideation: Yes Suicidal Intent: Yes Is patient at risk for suicide?: Yes Suicidal Plan?: Yes Specify Current Suicidal Plan: pt stabs held knife to throat today  Access to Means: Yes Specify Access to Suicidal Means: access to kitchen knives What has been your  use of drugs/alcohol within the last 12 months?: pt denies use despite assertions of use on earlier ED visits How many times?: 11 ("over 10") Other Self Harm Risks: none Triggers for Past Attempts: Unpredictable Intentional Self Injurious Behavior: Cutting Comment - Self Injurious Behavior: pt doesn't provide details Risk to Others: Homicidal Ideation: No Thoughts of Harm to Others: No Current Homicidal Intent: No Current Homicidal Plan: No Access to Homicidal Means: No Identified Victim: none History of harm to others?: No Assessment of Violence: In past 6-12 months Violent Behavior Description: pt denies hx violence Does patient have access to weapons?: No Criminal Charges Pending?: No Does patient have a court date: No Prior Inpatient Therapy: Prior Inpatient Therapy: Yes Prior Therapy Dates: over several years Prior Therapy Facilty/Provider(s): [facility](x5),[facility], [place], [facility] Reason for Treatment: mood disorder, psychosis Prior Outpatient Therapy: Prior Outpatient Therapy: No Prior Therapy Facilty/Provider(s): pt denies he has ever been to [facility] Does patient have an ACCT team?: No Does patient have Intensive In-House Services?  : No Does patient have [facility] services? : No Does patient have P4CC services?: No  Past Medical History:  Past Medical History  Diagnosis Date  . Asthma   . Seizures ([place])   . Brain ventricular shunt obstruction     hydrochelpis  . Depression   . Bipolar disorder ([place])   . Schizophrenia ([place])   . Homelessness     Past Surgical History  Procedure Laterality Date  . Tonsillectomy    . Ventriculo-peritoneal shunt placement / laparoscopic insertion peritoneal catheter     Family History:  Family History  Problem Relation Age of Onset  . Hypertension Mother   . Mental illness Neg Hx    Family  Psychiatric  History:  Social History:  History  Alcohol Use  . Yes     History  Drug Use  . Yes  . Special: Marijuana, Other-see comments,  Amphetamines    Comment: Adderall, "I don't do weed, only rarely"    Social History   Social History  . Marital Status: Single    Spouse Name: N/A  . Number of Children: N/A  . Years of Education: N/A   Social History Main Topics  . Smoking status: Former Smoker -- 0.50 packs/day    Types: Cigarettes    Quit date: 10/22/2015  . Smokeless tobacco: None  . Alcohol Use: Yes  . Drug Use: Yes    Special: Marijuana, Other-see comments, Amphetamines     Comment: Adderall, "I don't do weed, only rarely"  . Sexual Activity: Not Asked   Other Topics Concern  . None   Social History Narrative   Additional Social History:    Allergies:   Allergies  Allergen Reactions  . Amoxicillin Hives    .Marland KitchenHas patient had a PCN reaction causing immediate rash, facial/tongue/throat swelling, SOB or lightheadedness with hypotension: unknown Has patient had a PCN reaction causing severe rash involving mucus membranes or skin necrosis:Yes Has patient had a PCN reaction that required hospitalization unknown Has patient had a PCN reaction occurring within the last 10 years: unknown If all of the above answers are "NO", then may proceed with Cephalosporin use.   Marland Kitchen Penicillins Hives and Rash    Has patient had a PCN reaction causing immediate rash, facial/tongue/throat swelling, SOB or lightheadedness with hypotension: unknown Has patient had a PCN reaction causing severe rash involving mucus membranes or skin necrosis: Yes Has patient had a PCN reaction that required hospitalization :unknown Has patient had a PCN reaction occurring within the last 10 years: unknown If all of the above answers are "NO", then may proceed with Cephalosporin use.     Labs:  Results for orders placed or performed during the hospital encounter of 01/12/16 (from the past 48 hour(s))  Comprehensive metabolic panel     Status: None   Collection Time: 01/12/16  3:11 PM  Result Value Ref Range   Sodium 139 135 - 145 mmol/L    Potassium 3.8 3.5 - 5.1 mmol/L   Chloride 109 101 - 111 mmol/L   CO2 22 22 - 32 mmol/L   Glucose, Bld 99 65 - 99 mg/dL   BUN 10 6 - 20 mg/dL   Creatinine, Ser 0.75 0.61 - 1.24 mg/dL   Calcium 9.3 8.9 - 10.3 mg/dL   Total Protein 7.5 6.5 - 8.1 g/dL   Albumin 4.7 3.5 - 5.0 g/dL   AST 28 15 - 41 U/L   ALT 32 17 - 63 U/L   Alkaline Phosphatase 68 38 - 126 U/L   Total Bilirubin 0.9 0.3 - 1.2 mg/dL   GFR calc non Af Amer >60 >60 mL/min   GFR calc Af Amer >60 >60 mL/min    Comment: (NOTE) The eGFR has been calculated using the CKD EPI equation. This calculation has not been validated in all clinical situations. eGFR's persistently <60 mL/min signify possible Chronic Kidney Disease.    Anion gap 8 5 - 15  Ethanol     Status: None   Collection Time: 01/12/16  3:11 PM  Result Value Ref Range   Alcohol, Ethyl (B) <5 <5 mg/dL    Comment:        LOWEST DETECTABLE LIMIT FOR SERUM ALCOHOL IS  5 mg/dL FOR MEDICAL PURPOSES ONLY   Salicylate level     Status: None   Collection Time: 01/12/16  3:11 PM  Result Value Ref Range   Salicylate Lvl <2.4 2.8 - 30.0 mg/dL  Acetaminophen level     Status: Abnormal   Collection Time: 01/12/16  3:11 PM  Result Value Ref Range   Acetaminophen (Tylenol), Serum <10 (L) 10 - 30 ug/mL    Comment:        THERAPEUTIC CONCENTRATIONS VARY SIGNIFICANTLY. A RANGE OF 10-30 ug/mL MAY BE AN EFFECTIVE CONCENTRATION FOR MANY PATIENTS. HOWEVER, SOME ARE BEST TREATED AT CONCENTRATIONS OUTSIDE THIS RANGE. ACETAMINOPHEN CONCENTRATIONS >150 ug/mL AT 4 HOURS AFTER INGESTION AND >50 ug/mL AT 12 HOURS AFTER INGESTION ARE OFTEN ASSOCIATED WITH TOXIC REACTIONS.   cbc     Status: None   Collection Time: 01/12/16  3:11 PM  Result Value Ref Range   WBC 5.6 4.0 - 10.5 K/uL   RBC 5.58 4.22 - 5.81 MIL/uL   Hemoglobin 15.7 13.0 - 17.0 g/dL   HCT 44.6 39.0 - 52.0 %   MCV 79.9 78.0 - 100.0 fL   MCH 28.1 26.0 - 34.0 pg   MCHC 35.2 30.0 - 36.0 g/dL   RDW 12.2 11.5 -  15.5 %   Platelets 248 150 - 400 K/uL  Lithium level     Status: Abnormal   Collection Time: 01/12/16  3:11 PM  Result Value Ref Range   Lithium Lvl <0.06 (L) 0.60 - 1.20 mmol/L  Rapid urine drug screen (hospital performed)     Status: None   Collection Time: 01/12/16  8:34 PM  Result Value Ref Range   Opiates NONE DETECTED NONE DETECTED   Cocaine NONE DETECTED NONE DETECTED   Benzodiazepines NONE DETECTED NONE DETECTED   Amphetamines NONE DETECTED NONE DETECTED   Tetrahydrocannabinol NONE DETECTED NONE DETECTED   Barbiturates NONE DETECTED NONE DETECTED    Comment:        DRUG SCREEN FOR MEDICAL PURPOSES ONLY.  IF CONFIRMATION IS NEEDED FOR ANY PURPOSE, NOTIFY LAB WITHIN 5 DAYS.        LOWEST DETECTABLE LIMITS FOR URINE DRUG SCREEN Drug Class       Cutoff (ng/mL) Amphetamine      1000 Barbiturate      200 Benzodiazepine   825 Tricyclics       003 Opiates          300 Cocaine          300 THC              50     Current Facility-Administered Medications  Medication Dose Route Frequency Provider Last Rate Last Dose  . benztropine (COGENTIN) tablet 0.5 mg  0.5 mg Oral BID Leo Grosser, MD   0.5 mg at 01/13/16 7048  . fluPHENAZine (PROLIXIN) tablet 10 mg  10 mg Oral BID PC Berdia Lachman, MD      . hydrOXYzine (ATARAX/VISTARIL) tablet 25 mg  25 mg Oral Q6H PRN Leo Grosser, MD      . lithium carbonate (ESKALITH) CR tablet 450 mg  450 mg Oral BID PC Leo Grosser, MD   450 mg at 01/13/16 8891  . OLANZapine zydis (ZYPREXA) disintegrating tablet 10 mg  10 mg Oral Q8H PRN Jeran Hiltz, MD      . traZODone (DESYREL) tablet 50 mg  50 mg Oral QHS PRN Leo Grosser, MD       Current Outpatient Prescriptions  Medication Sig Dispense Refill  .  benztropine (COGENTIN) 0.5 MG tablet Take 1 tablet (0.5 mg total) by mouth 2 (two) times daily. 60 tablet 0  . fluPHENAZine (PROLIXIN) 10 MG tablet Take 1 tablet (10 mg total) by mouth at bedtime. 30 tablet 0  . fluPHENAZine (PROLIXIN) 5 MG  tablet Take 1 tablet (5 mg total) by mouth daily. 30 tablet 0  . lithium carbonate (ESKALITH) 450 MG CR tablet Take 1 tablet (450 mg total) by mouth 2 (two) times daily after a meal. 60 tablet 0  . nicotine (NICODERM CQ - DOSED IN MG/24 HOURS) 21 mg/24hr patch Place 1 patch (21 mg total) onto the skin daily. For smoking cessation (Patient not taking: Reported on 11/16/2015) 28 patch 0  . traZODone (DESYREL) 50 MG tablet Take 1 tablet (50 mg total) by mouth at bedtime as needed for sleep.      Musculoskeletal: Strength & Muscle Tone: within normal limits Gait & Station: normal Patient leans: N/A  Psychiatric Specialty Exam: Physical Exam  Psychiatric: His affect is angry and labile. His speech is rapid and/or pressured. He is agitated, aggressive, hyperactive and actively hallucinating. Cognition and memory are normal. He expresses impulsivity. He expresses suicidal ideation. He expresses suicidal plans.    Review of Systems  Constitutional: Negative.   HENT: Negative.   Eyes: Negative.   Respiratory: Negative.   Cardiovascular: Negative.   Gastrointestinal: Negative.   Genitourinary: Negative.   Musculoskeletal: Negative.   Skin: Negative.   Endo/Heme/Allergies: Negative.   Psychiatric/Behavioral: Positive for depression, suicidal ideas and hallucinations. The patient is nervous/anxious and has insomnia.     Blood pressure 107/69, pulse 60, temperature 98.1 F (36.7 C), temperature source Oral, resp. rate 18, SpO2 98 %.There is no weight on file to calculate BMI.  General Appearance: Disheveled  Eye Contact:  Good  Speech:  Pressured  Volume:  Increased  Mood:  Angry and Irritable  Affect:  Labile  Thought Process:  Disorganized  Orientation:  Full (Time, Place, and Person)  Thought Content:  Hallucinations: Auditory Visual and Paranoid Ideation  Suicidal Thoughts:  Yes.  with intent/plan  Homicidal Thoughts:  No  Memory:  Immediate;   Good Recent;   Good Remote;   Good   Judgement:  Impaired  Insight:  Lacking  Psychomotor Activity:  Increased  Concentration:  Concentration: Fair and Attention Span: Fair  Recall:  Good  Fund of Knowledge:  Fair  Language:  Good  Akathisia:  No  Handed:  Right  AIMS (if indicated):     Assets:  Communication Skills  ADL's:  Intact  Cognition:  WNL  Sleep:   poor     Treatment Plan Summary: PLAN: -Crisis stabilization -Daily contact with patient to assess, evaluate symptoms, progress in treatment and Medication management. -Zyprexa Zydis 77m po every 8 hrs as needed for agitation. -Contact Lithium CR 4512mpo bid for mood stabilization. -Increase Prolixin to 1049mid for psychosis.  Disposition: Recommend psychiatric Inpatient admission when medically cleared. Supportive therapy provided about ongoing stressors. Needs inpatient admission for stabilization  AkiCorena PilgrimD 01/13/2016 10:03 AM

## 2016-04-08 ENCOUNTER — Encounter (HOSPITAL_COMMUNITY): Payer: Self-pay | Admitting: Emergency Medicine

## 2016-04-08 ENCOUNTER — Emergency Department (HOSPITAL_COMMUNITY)
Admission: EM | Admit: 2016-04-08 | Discharge: 2016-04-09 | Disposition: A | Discharge status: Other Acute Inpatient Hospital | Payer: BLUE CROSS/BLUE SHIELD | Attending: Emergency Medicine | Admitting: Emergency Medicine

## 2016-04-08 DIAGNOSIS — Z87891 Personal history of nicotine dependence: Secondary | ICD-10-CM | POA: Insufficient documentation

## 2016-04-08 DIAGNOSIS — R441 Visual hallucinations: Secondary | ICD-10-CM | POA: Insufficient documentation

## 2016-04-08 DIAGNOSIS — Z79899 Other long term (current) drug therapy: Secondary | ICD-10-CM | POA: Diagnosis not present

## 2016-04-08 DIAGNOSIS — F250 Schizoaffective disorder, bipolar type: Secondary | ICD-10-CM | POA: Diagnosis present

## 2016-04-08 DIAGNOSIS — R45851 Suicidal ideations: Secondary | ICD-10-CM | POA: Diagnosis not present

## 2016-04-08 DIAGNOSIS — J45909 Unspecified asthma, uncomplicated: Secondary | ICD-10-CM | POA: Diagnosis not present

## 2016-04-08 LAB — COMPREHENSIVE METABOLIC PANEL
ALT: 29 U/L (ref 17–63)
AST: 27 U/L (ref 15–41)
Albumin: 4.7 g/dL (ref 3.5–5.0)
Alkaline Phosphatase: 79 U/L (ref 38–126)
Anion gap: 10 (ref 5–15)
BUN: 10 mg/dL (ref 6–20)
CO2: 24 mmol/L (ref 22–32)
Calcium: 9.8 mg/dL (ref 8.9–10.3)
Chloride: 104 mmol/L (ref 101–111)
Creatinine, Ser: 0.86 mg/dL (ref 0.61–1.24)
GFR calc Af Amer: >60 mL/min (ref >60)
GFR calc non Af Amer: >60 mL/min (ref >60)
Glucose, Bld: 90 mg/dL (ref 65–99)
Potassium: 3.4 mmol/L — ABNORMAL LOW (ref 3.5–5.1)
Sodium: 138 mmol/L (ref 135–145)
Total Bilirubin: 1.2 mg/dL (ref 0.3–1.2)
Total Protein: 8 g/dL (ref 6.5–8.1)

## 2016-04-08 LAB — RAPID URINE DRUG SCREEN, HOSP PERFORMED
Amphetamines: NOT DETECTED
Barbiturates: NOT DETECTED
Benzodiazepines: NOT DETECTED
Cocaine: NOT DETECTED
Opiates: NOT DETECTED
Tetrahydrocannabinol: NOT DETECTED

## 2016-04-08 LAB — CBC
HCT: 45.3 % (ref 39.0–52.0)
Hemoglobin: 15.6 g/dL (ref 13.0–17.0)
MCH: 28.5 pg (ref 26.0–34.0)
MCHC: 34.4 g/dL (ref 30.0–36.0)
MCV: 82.7 fL (ref 78.0–100.0)
Platelets: 257 10*3/uL (ref 150–400)
RBC: 5.48 MIL/uL (ref 4.22–5.81)
RDW: 12.2 % (ref 11.5–15.5)
WBC: 6.3 10*3/uL (ref 4.0–10.5)

## 2016-04-08 LAB — ACETAMINOPHEN LEVEL: Acetaminophen (Tylenol), Serum: <10 ug/mL — ABNORMAL LOW (ref 10–30)

## 2016-04-08 LAB — ETHANOL: Alcohol, Ethyl (B): <5 mg/dL (ref <5)

## 2016-04-08 LAB — SALICYLATE LEVEL: Salicylate Lvl: <4 mg/dL (ref 2.8–30.0)

## 2016-04-08 MED ORDER — FLUPHENAZINE HCL 10 MG PO TABS
10.0000 mg | ORAL_TABLET | Freq: Every day | ORAL | Status: DC
  Administered 2016-04-08: 10 mg via ORAL
  Filled 2016-04-08 (×2): qty 1

## 2016-04-08 MED ORDER — LITHIUM CARBONATE ER 450 MG PO TBCR
450.0000 mg | EXTENDED_RELEASE_TABLET | Freq: Two times a day (BID) | ORAL | Status: DC
  Administered 2016-04-09 (×2): 450 mg via ORAL
  Filled 2016-04-08 (×3): qty 1

## 2016-04-08 MED ORDER — ACETAMINOPHEN 325 MG PO TABS: 650.0000 mg | ORAL_TABLET | ORAL | Status: DC | PRN

## 2016-04-08 MED ORDER — BENZTROPINE MESYLATE 1 MG PO TABS
0.5000 mg | ORAL_TABLET | Freq: Two times a day (BID) | ORAL | Status: DC
  Administered 2016-04-08 – 2016-04-09 (×2): 0.5 mg via ORAL
  Filled 2016-04-08 (×2): qty 1

## 2016-04-08 MED ORDER — ALUM & MAG HYDROXIDE-SIMETH 200-200-20 MG/5ML PO SUSP: 30.0000 mL | ORAL | Status: DC | PRN

## 2016-04-08 MED ORDER — ONDANSETRON HCL 4 MG PO TABS: 4.0000 mg | ORAL_TABLET | Freq: Three times a day (TID) | ORAL | Status: DC | PRN

## 2016-04-08 MED ORDER — TRAZODONE HCL 50 MG PO TABS: 50.0000 mg | ORAL_TABLET | Freq: Every evening | ORAL | Status: DC | PRN

## 2016-04-08 MED ORDER — NICOTINE 21 MG/24HR TD PT24: 21.0000 mg | MEDICATED_PATCH | Freq: Every day | TRANSDERMAL | Status: DC

## 2016-04-08 NOTE — ED Triage Notes (Signed)
Pt reports he tried to hang himself twice with a rope over the last 2 days, but the rope broke. Pt reports he has been under a lot of stress recently. Denies HI or substance abuse.

## 2016-04-08 NOTE — ED Notes (Signed)
Pt has in belonging bag:  Grey book bag, blue jeans, black shoes, black socks (black phone in shoes), black and white charger, blue and red shirt, green lighter, [place] ID card, plaid jacket.

## 2016-04-08 NOTE — ED Notes (Signed)
Patient was observed on the camera urinating in the cup. When he was approached about what was reserved his response what you thought I was doing something. I explained I was trying to see what he was doing and that he could not urinate in the cup, we would appreciate if he walks to the bathroom

## 2016-04-08 NOTE — ED Notes (Signed)
Patient A/O, no noted distress and denies pain. Patient stated he "feels hopeless and have a lot on his plate. I have tried to hang myself twice and just don't feel like living." Patient has no noted markings on neck. He has no noted open areas on body. Patient verbal contracted to safety. Patient last seizure was when he was in elementary school. He noted that he believe his medication is causing tremors, he has advised his doctor. Staff will continue to monitor, maintain safety, and meet needs.

## 2016-04-08 NOTE — ED Provider Notes (Signed)
[facility] DEPT Provider Note   CSN: [id number] Arrival date & time: 04/08/16  1828     History   Chief Complaint Chief Complaint  Patient presents with  . Suicidal    HPI Justin Chaney is a 20 y.o. male.  20 year old male with past medical history including schizophrenia, bipolar disorder who presents with suicidal ideation. The patient reports running out of his medications a few weeks ago. He does not like taking them because the lithium makes him shaky and the others make him tired. He reports worsening depression over the past few weeks. He tried to hang himself yesterday in a suicide attempt but the rope broke. Today he states that he tried to cut his throat at [employer] but was unsuccessful. He states he has visual hallucinations including seeing angels and demons and he "knows they are watching me" from the TV but he doesn't mind.   The history is provided by the patient.    Past Medical History:  Diagnosis Date  . Asthma   . Bipolar disorder ([place])   . Brain ventricular shunt obstruction    hydrochelpis  . Depression   . Homelessness   . Schizophrenia ([place])   . Seizures [facility])     Patient Active Problem List   Diagnosis Date Noted  . Suicidal ideations   . Hallucinations   . Homicidal ideation   . Suicidal thoughts   . Cocaine use disorder, severe, dependence ([place]) 11/07/2015  . Amphetamine use disorder, severe, dependence ([place]) 11/07/2015  . Cannabis use disorder, moderate, in sustained remission 11/07/2015  . Intermittent explosive disorder 09/05/2015  . Schizoaffective disorder, bipolar type ([place]) 09/04/2015  . Non compliance w medication regimen 01/17/2015    Past Surgical History:  Procedure Laterality Date  . TONSILLECTOMY    . VENTRICULO-PERITONEAL SHUNT PLACEMENT / LAPAROSCOPIC INSERTION PERITONEAL CATHETER         Home Medications    Prior to Admission medications   Medication Sig Start Date End Date Taking? Authorizing Provider    benztropine (COGENTIN) 0.5 MG tablet Take 1 tablet (0.5 mg total) by mouth 2 (two) times daily. 11/18/15  Yes Niel Hummer, NP  fluPHENAZine (PROLIXIN) 10 MG tablet Take 1 tablet (10 mg total) by mouth at bedtime. 11/18/15  Yes Niel Hummer, NP  fluPHENAZine (PROLIXIN) 5 MG tablet Take 1 tablet (5 mg total) by mouth daily. 11/18/15  Yes Niel Hummer, NP  lithium carbonate (ESKALITH) 450 MG CR tablet Take 1 tablet (450 mg total) by mouth 2 (two) times daily after a meal. 11/18/15  Yes Niel Hummer, NP  nicotine (NICODERM CQ - DOSED IN MG/24 HOURS) 21 mg/24hr patch Place 1 patch (21 mg total) onto the skin daily. For smoking cessation Patient not taking: Reported on 11/16/2015 11/13/15   Benjamine Mola, FNP  traZODone (DESYREL) 50 MG tablet Take 1 tablet (50 mg total) by mouth at bedtime as needed for sleep. Patient not taking: Reported on 04/08/2016 11/18/15   Niel Hummer, NP    Family History Family History  Problem Relation Age of Onset  . Hypertension Mother   . Mental illness Neg Hx     Social History Social History  Substance Use Topics  . Smoking status: Former Smoker    Packs/day: 0.50    Types: Cigarettes    Quit date: 10/22/2015  . Smokeless tobacco: Not on file  . Alcohol use Yes     Allergies   Amoxicillin and Penicillins   Review  of Systems Review of Systems 10 Systems reviewed and are negative for acute change except as noted in the HPI.   Physical Exam Updated Vital Signs BP 103/59 (BP Location: Left Arm)   Pulse 90   Temp 98 F (36.7 C) (Oral)   Resp 18   SpO2 95%   Physical Exam  Constitutional: He is oriented to person, place, and time. He appears well-developed and well-nourished. No distress.  HENT:  Head: Normocephalic and atraumatic.  Eyes: Conjunctivae are normal.  Neck: Neck supple.  Cardiovascular: Normal rate, regular rhythm and normal heart sounds.   No murmur heard. Pulmonary/Chest: Effort normal and breath sounds normal.  Neurological:  He is alert and oriented to person, place, and time.  Skin: Skin is warm and dry.  Psychiatric:  Slightly pressured speech but calm and cooperative  Nursing note and vitals reviewed.    ED Treatments / Results  Labs (all labs ordered are listed, but only abnormal results are displayed) Labs Reviewed  COMPREHENSIVE METABOLIC PANEL - Abnormal; Notable for the following:       Result Value   Potassium 3.4 (*)    All other components within normal limits  ACETAMINOPHEN LEVEL - Abnormal; Notable for the following:    Acetaminophen (Tylenol), Serum <10 (*)    All other components within normal limits  ETHANOL  SALICYLATE LEVEL  CBC  URINE RAPID DRUG SCREEN, HOSP PERFORMED    EKG  EKG Interpretation None       Radiology No results found.  Procedures Procedures (including critical care time)  Medications Ordered in ED Medications  alum & mag hydroxide-simeth (MAALOX/MYLANTA) 200-200-20 MG/5ML suspension 30 mL (not administered)  ondansetron (ZOFRAN) tablet 4 mg (not administered)  nicotine (NICODERM CQ - dosed in mg/24 hours) patch 21 mg (not administered)  acetaminophen (TYLENOL) tablet 650 mg (not administered)  benztropine (COGENTIN) tablet 0.5 mg (not administered)  fluPHENAZine (PROLIXIN) tablet 10 mg (not administered)  lithium carbonate (ESKALITH) CR tablet 450 mg (not administered)  traZODone (DESYREL) tablet 50 mg (not administered)     Initial Impression / Assessment and Plan / ED Course  I have reviewed the triage vital signs and the nursing notes.  Pertinent labs that were available during my care of the patient were reviewed by me and considered in my medical decision making (see chart for details).  Clinical Course   Patient with history of bipolar disorder and schizophrenia presents with suicidal ideation and several suicide attempts recently. He has been off his medications for 2 weeks. He was well-appearing on exam. His vital signs and screening lab  work are unremarkable. He is medically clear for psychiatric evaluation. His disposition will be determined by recommendations from psychiatry team.  Final Clinical Impressions(s) / ED Diagnoses   Final diagnoses:  None    New Prescriptions New Prescriptions   No medications on file     Sharlett Iles, MD 04/08/16 2231

## 2016-04-09 DIAGNOSIS — R45851 Suicidal ideations: Secondary | ICD-10-CM

## 2016-04-09 DIAGNOSIS — F250 Schizoaffective disorder, bipolar type: Secondary | ICD-10-CM | POA: Diagnosis not present

## 2016-04-09 NOTE — ED Notes (Signed)
Pt is pleasant and cooperative.  He c/o s/i but verbally contracts for safety.  Also c/o Audio and visual hallucinations.  15 minute checks and video monitoring continue.

## 2016-04-09 NOTE — Progress Notes (Signed)
Patient has been accepted to [facility].  Patient to report to Admissions Department Accepting physician is Dr. [name] Sports  Call report to [phone].  Representative was Randall Hiss at [facility]  Patient can arrive at any time today per Octavio Manns K. Nash Shearer, LPC-A, [facility]  Counselor 04/09/2016 12:07 PM

## 2016-04-09 NOTE — Progress Notes (Signed)
Spoke with [facility] discussed disposition , Ronalee Belts stated request for pt. To be IVC then will admit. Discussed with pt. RN, RN called back, stated pt. Will be IVC by EDP. Called admissions back at [facility] spoke with Sonia Baller, who sated still accepting pt. Once IVC, can call report when ready to transport. Thurl Boen K. Nash Shearer, LPC-A, [facility]  Counselor 04/09/2016 1:41 PM
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
## 2016-04-09 NOTE — Progress Notes (Signed)
04/09/16 1354:  Pt introduced self to pt and offered activities.  Pt was lying down and appeared tired.  Pt stated he wasn't interested in activities at the time.   Victorino Sparrow, LRT/CTRS

## 2016-04-09 NOTE — ED Notes (Signed)
Notified EDP that [facility] wanted patient IVC'd before they would accept him.

## 2016-04-09 NOTE — ED Notes (Signed)
Patient is resting

## 2016-04-09 NOTE — Progress Notes (Signed)
Patient referrals for Hospitals accepting referrals submitted to: [address], [place], [place], [place], [place], [place], [place], [place], [place], [place], [place], and [place] Sent on 04/09/16 at [place]. Nash Shearer, LPC-A, [facility]  Counselor 04/09/2016 11:19 AM
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
## 2016-04-20 ENCOUNTER — Emergency Department (HOSPITAL_COMMUNITY)
Admission: EM | Admit: 2016-04-20 | Discharge: 2016-04-22 | Disposition: A | Discharge status: Other Acute Inpatient Hospital | Payer: BLUE CROSS/BLUE SHIELD | Attending: Emergency Medicine | Admitting: Emergency Medicine

## 2016-04-20 ENCOUNTER — Encounter (HOSPITAL_COMMUNITY): Payer: Self-pay

## 2016-04-20 DIAGNOSIS — T50902A Poisoning by unspecified drugs, medicaments and biological substances, intentional self-harm, initial encounter: Secondary | ICD-10-CM

## 2016-04-20 DIAGNOSIS — Z8249 Family history of ischemic heart disease and other diseases of the circulatory system: Secondary | ICD-10-CM | POA: Diagnosis not present

## 2016-04-20 DIAGNOSIS — J45909 Unspecified asthma, uncomplicated: Secondary | ICD-10-CM | POA: Insufficient documentation

## 2016-04-20 DIAGNOSIS — F250 Schizoaffective disorder, bipolar type: Secondary | ICD-10-CM

## 2016-04-20 DIAGNOSIS — T1491 Suicide attempt: Secondary | ICD-10-CM | POA: Diagnosis present

## 2016-04-20 DIAGNOSIS — Z818 Family history of other mental and behavioral disorders: Secondary | ICD-10-CM | POA: Diagnosis not present

## 2016-04-20 DIAGNOSIS — Z87891 Personal history of nicotine dependence: Secondary | ICD-10-CM | POA: Insufficient documentation

## 2016-04-20 DIAGNOSIS — T578X2A Toxic effect of other specified inorganic substances, intentional self-harm, initial encounter: Secondary | ICD-10-CM | POA: Insufficient documentation

## 2016-04-20 DIAGNOSIS — Z79899 Other long term (current) drug therapy: Secondary | ICD-10-CM | POA: Insufficient documentation

## 2016-04-20 DIAGNOSIS — F209 Schizophrenia, unspecified: Secondary | ICD-10-CM | POA: Diagnosis present

## 2016-04-20 LAB — RAPID URINE DRUG SCREEN, HOSP PERFORMED
Amphetamines: NOT DETECTED
Barbiturates: NOT DETECTED
Benzodiazepines: NOT DETECTED
Cocaine: NOT DETECTED
Opiates: NOT DETECTED
Tetrahydrocannabinol: NOT DETECTED

## 2016-04-20 LAB — COMPREHENSIVE METABOLIC PANEL
ALT: 48 U/L (ref 17–63)
AST: 34 U/L (ref 15–41)
Albumin: 4.2 g/dL (ref 3.5–5.0)
Alkaline Phosphatase: 67 U/L (ref 38–126)
Anion gap: 6 (ref 5–15)
BUN: 11 mg/dL (ref 6–20)
CO2: 27 mmol/L (ref 22–32)
Calcium: 9.3 mg/dL (ref 8.9–10.3)
Chloride: 109 mmol/L (ref 101–111)
Creatinine, Ser: 1.02 mg/dL (ref 0.61–1.24)
GFR calc Af Amer: >60 mL/min (ref >60)
GFR calc non Af Amer: >60 mL/min (ref >60)
Glucose, Bld: 131 mg/dL — ABNORMAL HIGH (ref 65–99)
Potassium: 4 mmol/L (ref 3.5–5.1)
Sodium: 142 mmol/L (ref 135–145)
Total Bilirubin: 0.6 mg/dL (ref 0.3–1.2)
Total Protein: 7.2 g/dL (ref 6.5–8.1)

## 2016-04-20 LAB — SALICYLATE LEVEL: Salicylate Lvl: <4 mg/dL (ref 2.8–30.0)

## 2016-04-20 LAB — CBC
HCT: 42.8 % (ref 39.0–52.0)
Hemoglobin: 14.6 g/dL (ref 13.0–17.0)
MCH: 28.5 pg (ref 26.0–34.0)
MCHC: 34.1 g/dL (ref 30.0–36.0)
MCV: 83.6 fL (ref 78.0–100.0)
Platelets: 245 10*3/uL (ref 150–400)
RBC: 5.12 MIL/uL (ref 4.22–5.81)
RDW: 11.8 % (ref 11.5–15.5)
WBC: 9.2 10*3/uL (ref 4.0–10.5)

## 2016-04-20 LAB — CBG MONITORING, ED: Glucose-Capillary: 118 mg/dL — ABNORMAL HIGH (ref 65–99)

## 2016-04-20 LAB — ETHANOL: Alcohol, Ethyl (B): <5 mg/dL (ref <5)

## 2016-04-20 LAB — ACETAMINOPHEN LEVEL: Acetaminophen (Tylenol), Serum: <10 ug/mL — ABNORMAL LOW (ref 10–30)

## 2016-04-20 LAB — LITHIUM LEVEL: Lithium Lvl: 0.13 mmol/L — ABNORMAL LOW (ref 0.60–1.20)

## 2016-04-20 MED ORDER — LITHIUM CARBONATE ER 450 MG PO TBCR
450.0000 mg | EXTENDED_RELEASE_TABLET | Freq: Two times a day (BID) | ORAL | Status: DC
  Administered 2016-04-21 – 2016-04-22 (×3): 450 mg via ORAL
  Filled 2016-04-20 (×5): qty 1

## 2016-04-20 MED ORDER — FLUPHENAZINE HCL 10 MG PO TABS
10.0000 mg | ORAL_TABLET | Freq: Every day | ORAL | Status: DC
  Administered 2016-04-20 – 2016-04-21 (×2): 10 mg via ORAL
  Filled 2016-04-20 (×2): qty 1

## 2016-04-20 MED ORDER — ACETAMINOPHEN 325 MG PO TABS
650.0000 mg | ORAL_TABLET | Freq: Four times a day (QID) | ORAL | Status: DC | PRN
Start: 2016-04-20 — End: 2016-04-22

## 2016-04-20 MED ORDER — BENZTROPINE MESYLATE 1 MG PO TABS
0.5000 mg | ORAL_TABLET | Freq: Two times a day (BID) | ORAL | Status: DC
  Administered 2016-04-20 – 2016-04-22 (×4): 0.5 mg via ORAL
  Filled 2016-04-20 (×4): qty 1

## 2016-04-20 MED ORDER — FLUPHENAZINE HCL 5 MG PO TABS
5.0000 mg | ORAL_TABLET | Freq: Every day | ORAL | Status: DC
  Administered 2016-04-21 – 2016-04-22 (×2): 5 mg via ORAL
  Filled 2016-04-20 (×3): qty 1

## 2016-04-20 NOTE — ED Notes (Signed)
Bed: RESB Expected date:  Expected time:  Means of arrival:  Comments: OD, 20 Trazadone, SI

## 2016-04-20 NOTE — ED Notes (Signed)
Pt attempting to knock this RN's hand while trying to insert IV. Pt states "I want to to stab through my vein so I bleed internally and die." Pt easily redirected and later apologized.

## 2016-04-20 NOTE — ED Provider Notes (Signed)
[facility] DEPT Provider Note   CSN: [id number] Arrival date & time: 04/20/16  1938     History   Chief Complaint Chief Complaint  Patient presents with  . Suicidal  . Ingestion    20 trazadone    HPI Justin Chaney is a 20 y.o. male.  The history is provided by the patient.  Mental Health Problem  Presenting symptoms: suicidal thoughts, suicidal threats and suicide attempt   Degree of incapacity (severity):  Moderate Onset quality:  Sudden Duration:  1 day Timing:  Constant Progression:  Unchanged Chronicity:  Recurrent Context: stressful life event   Context comment:  Discharged from psychiatric facility Treatment compliance:  None of the time Relieved by:  Nothing Worsened by:  Nothing Ineffective treatments:  None tried Associated symptoms: anxiety and feelings of worthlessness   Risk factors: hx of mental illness, hx of suicide attempts and recent psychiatric admission     Past Medical History:  Diagnosis Date  . Asthma   . Bipolar disorder ([place])   . Brain ventricular shunt obstruction    hydrochelpis  . Depression   . Homelessness   . Schizophrenia ([place])   . Seizures [facility])     Patient Active Problem List   Diagnosis Date Noted  . Suicidal ideations   . Hallucinations   . Homicidal ideation   . Suicidal thoughts   . Cocaine use disorder, severe, dependence ([place]) 11/07/2015  . Amphetamine use disorder, severe, dependence ([place]) 11/07/2015  . Cannabis use disorder, moderate, in sustained remission 11/07/2015  . Intermittent explosive disorder 09/05/2015  . Schizoaffective disorder, bipolar type ([place]) 09/04/2015  . Non compliance w medication regimen 01/17/2015    Past Surgical History:  Procedure Laterality Date  . TONSILLECTOMY    . VENTRICULO-PERITONEAL SHUNT PLACEMENT / LAPAROSCOPIC INSERTION PERITONEAL CATHETER         Home Medications    Prior to Admission medications   Medication Sig Start Date End Date Taking? Authorizing  Provider  benztropine (COGENTIN) 0.5 MG tablet Take 1 tablet (0.5 mg total) by mouth 2 (two) times daily. 11/18/15  Yes Niel Hummer, NP  fluPHENAZine (PROLIXIN) 10 MG tablet Take 1 tablet (10 mg total) by mouth at bedtime. 11/18/15  Yes Niel Hummer, NP  fluPHENAZine (PROLIXIN) 5 MG tablet Take 1 tablet (5 mg total) by mouth daily. 11/18/15  Yes Niel Hummer, NP  lithium carbonate (ESKALITH) 450 MG CR tablet Take 1 tablet (450 mg total) by mouth 2 (two) times daily after a meal. 11/18/15  Yes Niel Hummer, NP  TRAZODONE HCL PO Take by mouth once.   Yes Historical Provider, MD  nicotine (NICODERM CQ - DOSED IN MG/24 HOURS) 21 mg/24hr patch Place 1 patch (21 mg total) onto the skin daily. For smoking cessation Patient not taking: Reported on 04/20/2016 11/13/15   Benjamine Mola, FNP  traZODone (DESYREL) 50 MG tablet Take 1 tablet (50 mg total) by mouth at bedtime as needed for sleep. Patient not taking: Reported on 04/20/2016 11/18/15   Niel Hummer, NP    Family History Family History  Problem Relation Age of Onset  . Hypertension Mother   . Mental illness Neg Hx     Social History Social History  Substance Use Topics  . Smoking status: Former Smoker    Packs/day: 0.50    Types: Cigarettes    Quit date: 10/22/2015  . Smokeless tobacco: Never Used  . Alcohol use Yes     Allergies   Amoxicillin  and Penicillins   Review of Systems Review of Systems  Psychiatric/Behavioral: Positive for suicidal ideas. The patient is nervous/anxious.   All other systems reviewed and are negative.    Physical Exam Updated Vital Signs BP 110/71 (BP Location: Left Arm)   Pulse 98   Temp 99.2 F (37.3 C) (Oral)   Resp 16   SpO2 95%   Physical Exam  Constitutional: He is oriented to person, place, and time. He appears well-developed and well-nourished. No distress.  HENT:  Head: Normocephalic and atraumatic.  Eyes: Conjunctivae are normal.  Neck: Neck supple. No tracheal deviation present.    Cardiovascular: Normal rate, regular rhythm and normal heart sounds.   Pulmonary/Chest: Effort normal and breath sounds normal. No respiratory distress.  Abdominal: Soft. He exhibits no distension.  Neurological: He is alert and oriented to person, place, and time.  Skin: Skin is warm and dry.  Psychiatric: His behavior is normal. Cognition and memory are normal. He exhibits a depressed mood. He expresses suicidal ideation. He expresses suicidal plans.     ED Treatments / Results  Labs (all labs ordered are listed, but only abnormal results are displayed) Labs Reviewed  COMPREHENSIVE METABOLIC PANEL - Abnormal; Notable for the following:       Result Value   Glucose, Bld 131 (*)    All other components within normal limits  ACETAMINOPHEN LEVEL - Abnormal; Notable for the following:    Acetaminophen (Tylenol), Serum <10 (*)    All other components within normal limits  LITHIUM LEVEL - Abnormal; Notable for the following:    Lithium Lvl 0.13 (*)    All other components within normal limits  CBG MONITORING, ED - Abnormal; Notable for the following:    Glucose-Capillary 118 (*)    All other components within normal limits  ETHANOL  SALICYLATE LEVEL  CBC  URINE RAPID DRUG SCREEN, HOSP PERFORMED    EKG  EKG Interpretation  Date/Time:  Tuesday April 20 2016 19:56:27 EDT Ventricular Rate:  94 PR Interval:    QRS Duration: 86 QT Interval:  340 QTC Calculation: 426 R Axis:   37 Text Interpretation:  Sinus rhythm No significant change since last tracing Confirmed by Janey Petron MD, Camryn Lampson [id number]) on 04/20/2016 9:02:51 PM       Radiology No results found.  Procedures Procedures (including critical care time)  Medications Ordered in ED Medications  benztropine (COGENTIN) tablet 0.5 mg (not administered)  fluPHENAZine (PROLIXIN) tablet 10 mg (not administered)  fluPHENAZine (PROLIXIN) tablet 5 mg (not administered)  lithium carbonate (ESKALITH) CR tablet 450 mg (not  administered)  acetaminophen (TYLENOL) tablet 650 mg (not administered)     Initial Impression / Assessment and Plan / ED Course  I have reviewed the triage vital signs and the nursing notes.  Pertinent labs & imaging results that were available during my care of the patient were reviewed by me and considered in my medical decision making (see chart for details).  Clinical Course    20 y.o. male presents with suicidal thoughts and took 20 trazodone tablets in an apparent suicide attempt. Well appearing. NAD. Continues to endorse active Si with plan to overdose but willing to stay for evaluation. Pt is lucid, no medical interventions indicated. Will monitor on telemetry until 1 AM at which time Pt will be medically clear for psychiatric admission if indicated. TTS consulted to initiate evaluation.  MEDICALLY CLEAR FOR TRANSFER OR PSYCHIATRIC ADMISSION AT 1AM.   Final Clinical Impressions(s) / ED Diagnoses   Final  diagnoses:  Medication overdose, intentional self-harm, initial encounter [facility])    New Prescriptions New Prescriptions   No medications on file     Leo Grosser, MD 04/20/16 2211

## 2016-04-20 NOTE — ED Notes (Signed)
TTS at bedside. 

## 2016-04-20 NOTE — ED Triage Notes (Signed)
Pt BIB [facility] from [employer] [facility] [facility]. Pt took 20 trazadone about 20 mins ago in an attempt to kill himself. States "I dont want to live anymore." Hx of suicide attempts. Pt c/o nausea. A&Ox4.
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
## 2016-04-20 NOTE — ED Notes (Signed)
Pt is prescribed 50 mg pills of trazadone, so that is probably the strength that he took.

## 2016-04-21 DIAGNOSIS — Z818 Family history of other mental and behavioral disorders: Secondary | ICD-10-CM | POA: Diagnosis not present

## 2016-04-21 DIAGNOSIS — F250 Schizoaffective disorder, bipolar type: Secondary | ICD-10-CM

## 2016-04-21 DIAGNOSIS — R45851 Suicidal ideations: Secondary | ICD-10-CM

## 2016-04-21 DIAGNOSIS — Z8249 Family history of ischemic heart disease and other diseases of the circulatory system: Secondary | ICD-10-CM | POA: Diagnosis not present

## 2016-04-21 DIAGNOSIS — Z880 Allergy status to penicillin: Secondary | ICD-10-CM

## 2016-04-21 DIAGNOSIS — Z87891 Personal history of nicotine dependence: Secondary | ICD-10-CM

## 2016-04-21 DIAGNOSIS — Z79899 Other long term (current) drug therapy: Secondary | ICD-10-CM

## 2016-04-21 NOTE — Progress Notes (Signed)
CSW received call from [facility] and they are able to give bed offer tomorrow morning at 8am and patient will be going to [facility].   Accepting Physician: Dr. Vilinda Flake  # for Report: [place], [place] Worker [phone]

## 2016-04-21 NOTE — ED Notes (Signed)
Patient A/O, no noted distress. Admits SI, patients states "I'm going through a lot." He did not want to talk about it at the time of assessment. He is pleasant and cooperative. At this time he does not have a plan and goal to meet his behavior health care plan. Patient verbally contracted to safety. Staff will  Continue to monitor, meet needs, and maintain safety.

## 2016-04-21 NOTE — BH Assessment (Signed)
[facility] Assessment Progress Note  The following facilities have been contacted to seek placement for this pt, with results as noted:  Beds available, information sent, decision pending:  [facility]   At capacity:  [facility] [place], [place] Triage Specialist [phone]

## 2016-04-21 NOTE — ED Notes (Signed)
Bed: [id number] Expected date:  Expected time:  Means of arrival:  Comments: Res B

## 2016-04-21 NOTE — ED Notes (Signed)
Patient changed in purple scrubs/yellow socks. Sitter at bedside. Patient wanded by security.

## 2016-04-21 NOTE — ED Notes (Signed)
Bed: [facility] Expected date:  Expected time:  Means of arrival:  Comments: Hold for TCU room 30

## 2016-04-21 NOTE — ED Notes (Signed)
Patient being assessed by psychiatrist and nurse practitioner.  Patient reported to nurse that inpatient treatment was suggested.

## 2016-04-21 NOTE — ED Notes (Signed)
Patient's belongings placed in locker # 37.

## 2016-04-21 NOTE — ED Notes (Signed)
Patient denies pain and is resting comfortably.  
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
## 2016-04-21 NOTE — ED Notes (Signed)
Tried to speak with pt when he was admitted to [facility], but he would not respond verbally. He was in bed with his eyes open.

## 2016-04-21 NOTE — Progress Notes (Signed)
04/21/16 1352:  LRT went to offer activities to pt.  Pt refused.  Victorino Sparrow, LRT/CTRS

## 2016-04-21 NOTE — ED Notes (Signed)
Awake to void, states he is hungry, given mac and cheese

## 2016-04-22 NOTE — ED Notes (Signed)
Up to the bathroom, pt is aware that [facility] will be here shortly

## 2016-04-22 NOTE — ED Notes (Signed)
Up tot he bathroom to shower and change scrubs 

## 2016-04-22 NOTE — ED Notes (Signed)
[facility] is here to transport

## 2016-04-22 NOTE — Progress Notes (Signed)
This pt has been seen previously and offered pcp resources Pt is non compliant related to f/u care to pcp services Pt with [employer] Pt informed how to go on line to search for an in [name] doctor for follow up care and also how to call [facility] customer service to ask for assistance prn  This information also entered in d/c instructions

## 2016-04-22 NOTE — ED Notes (Signed)
Up to the bathroom 

## 2016-04-22 NOTE — ED Notes (Signed)
[facility] wil be here in approx 20 mins to transport

## 2016-04-22 NOTE — ED Notes (Signed)
[facility] contacted and will be here in approx 15 mins

## 2016-04-22 NOTE — ED Notes (Addendum)
Pt ambulatory w/o difficulty w/ [facility] to [facility].  EMTELA, assessment note, transfer report, MAR, AVS, EKG, labs, and belongings given to driver. Sandwich/juice/crackers given to pt for snack.

## 2016-04-22 NOTE — ED Notes (Signed)
Patient slept well throughout shift.

## 2016-04-22 NOTE — ED Notes (Signed)
Magda Paganini RN at [facility] notified that pt is in transit

## 2016-09-08 ENCOUNTER — Encounter (HOSPITAL_COMMUNITY): Payer: Self-pay | Admitting: Emergency Medicine

## 2016-09-08 ENCOUNTER — Emergency Department (HOSPITAL_COMMUNITY)
Admission: EM | Admit: 2016-09-08 | Discharge: 2016-09-11 | Disposition: A | Discharge status: Home / Self Care | Payer: Medicaid Other | Attending: Emergency Medicine | Admitting: Emergency Medicine

## 2016-09-08 DIAGNOSIS — F6381 Intermittent explosive disorder: Secondary | ICD-10-CM | POA: Diagnosis present

## 2016-09-08 DIAGNOSIS — F250 Schizoaffective disorder, bipolar type: Secondary | ICD-10-CM | POA: Diagnosis not present

## 2016-09-08 DIAGNOSIS — Z5181 Encounter for therapeutic drug level monitoring: Secondary | ICD-10-CM | POA: Insufficient documentation

## 2016-09-08 DIAGNOSIS — Z87891 Personal history of nicotine dependence: Secondary | ICD-10-CM | POA: Diagnosis not present

## 2016-09-08 DIAGNOSIS — J45909 Unspecified asthma, uncomplicated: Secondary | ICD-10-CM | POA: Diagnosis not present

## 2016-09-08 DIAGNOSIS — Z8249 Family history of ischemic heart disease and other diseases of the circulatory system: Secondary | ICD-10-CM | POA: Diagnosis not present

## 2016-09-08 DIAGNOSIS — F22 Delusional disorders: Secondary | ICD-10-CM

## 2016-09-08 DIAGNOSIS — R45850 Homicidal ideations: Secondary | ICD-10-CM | POA: Diagnosis present

## 2016-09-08 DIAGNOSIS — R443 Hallucinations, unspecified: Secondary | ICD-10-CM | POA: Diagnosis not present

## 2016-09-08 DIAGNOSIS — R45851 Suicidal ideations: Secondary | ICD-10-CM | POA: Diagnosis not present

## 2016-09-08 DIAGNOSIS — Z9889 Other specified postprocedural states: Secondary | ICD-10-CM | POA: Diagnosis not present

## 2016-09-08 LAB — CBC
HCT: 43.3 % (ref 39.0–52.0)
Hemoglobin: 14.5 g/dL (ref 13.0–17.0)
MCH: 27.5 pg (ref 26.0–34.0)
MCHC: 33.5 g/dL (ref 30.0–36.0)
MCV: 82.2 fL (ref 78.0–100.0)
Platelets: 257 10*3/uL (ref 150–400)
RBC: 5.27 MIL/uL (ref 4.22–5.81)
RDW: 12.6 % (ref 11.5–15.5)
WBC: 8.8 10*3/uL (ref 4.0–10.5)

## 2016-09-08 LAB — COMPREHENSIVE METABOLIC PANEL
ALT: 26 U/L (ref 17–63)
AST: 25 U/L (ref 15–41)
Albumin: 4.2 g/dL (ref 3.5–5.0)
Alkaline Phosphatase: 89 U/L (ref 38–126)
Anion gap: 7 (ref 5–15)
BUN: 13 mg/dL (ref 6–20)
CO2: 24 mmol/L (ref 22–32)
Calcium: 9.3 mg/dL (ref 8.9–10.3)
Chloride: 108 mmol/L (ref 101–111)
Creatinine, Ser: 0.96 mg/dL (ref 0.61–1.24)
GFR calc Af Amer: >60 mL/min (ref >60)
GFR calc non Af Amer: >60 mL/min (ref >60)
Glucose, Bld: 103 mg/dL — ABNORMAL HIGH (ref 65–99)
Potassium: 3.7 mmol/L (ref 3.5–5.1)
Sodium: 139 mmol/L (ref 135–145)
Total Bilirubin: 0.4 mg/dL (ref 0.3–1.2)
Total Protein: 7.5 g/dL (ref 6.5–8.1)

## 2016-09-08 LAB — RAPID URINE DRUG SCREEN, HOSP PERFORMED
Amphetamines: NOT DETECTED
Barbiturates: NOT DETECTED
Benzodiazepines: NOT DETECTED
Cocaine: NOT DETECTED
Opiates: NOT DETECTED
Tetrahydrocannabinol: NOT DETECTED

## 2016-09-08 LAB — SALICYLATE LEVEL: Salicylate Lvl: <7 mg/dL (ref 2.8–30.0)

## 2016-09-08 LAB — ETHANOL: Alcohol, Ethyl (B): <5 mg/dL (ref <5)

## 2016-09-08 LAB — ACETAMINOPHEN LEVEL: Acetaminophen (Tylenol), Serum: <10 ug/mL — ABNORMAL LOW (ref 10–30)

## 2016-09-08 NOTE — ED Triage Notes (Signed)
Per [facility] pt was picked up from [facility] after being called out by [employer] due to pt "having an episode." Pt states he "believes in sacrificing woman and children." Pt reports having "Issues since I was a kid. I am not human. I am a demon."

## 2016-09-08 NOTE — ED Notes (Signed)
When assessing the patient and inquiring about his homicidal thoughts, the patient stated, "I want you to write this down and tell everyone.  I am a Satanist.  I am the antichrist and I am the embodiment of [name]. I believe in sacrificing women and children. [place]ace [place] is my kingdom and I will rise.  These sacrifices will happen."    When asked if he hears or sees things that maybe others don't see, he said, "Yes because my third eye is open and it's nothing you could comprehend."  Pt denies thoughts of suicide and endorses thoughts of homicide.  Pt is asking about his knife."

## 2016-09-08 NOTE — ED Provider Notes (Signed)
[facility] DEPT Provider Note   CSN: [id number] Arrival date & time: 09/08/16  2125     History   Chief Complaint Chief Complaint  Patient presents with  . Homicidal    HPI Justin Chaney is a 21 y.o. male.  HPI Patient presents to the emergency room for evaluation of homicidal ideations. [facility] [place] [facility] [place] [facility] [place] [place] were called because of his behavior.  Patient states that he is lucifer. He believes in child sacrifice. He believes and drinking blood.  He is the antichrist.  Patient does not have any specific plan to harm anyone in particular.  She does have history of schizophrenia and previous psychiatric problems. Patient states he is not taking his medications. He denies any other complaints. Past Medical History:  Diagnosis Date  . Asthma   . Bipolar disorder ([place])   . 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 Depression   . Homelessness   . Schizophrenia ([place])   . Seizures [facility])     Patient Active Problem List   Diagnosis Date Noted  . Suicidal ideations   . Hallucinations   . Homicidal ideation   . Suicidal thoughts   . Cocaine use disorder, severe, dependence ([place]) 11/07/2015  . Amphetamine use disorder, severe, dependence ([place]) 11/07/2015  . Cannabis use disorder, moderate, in sustained remission ([place]) 11/07/2015  . Intermittent explosive disorder 09/05/2015  . Schizoaffective disorder, bipolar type ([place]) 09/04/2015  . Non compliance w medication regimen 01/17/2015    Past Surgical History:  Procedure Laterality Date  . TONSILLECTOMY    . VENTRICULO-PERITONEAL SHUNT PLACEMENT / LAPAROSCOPIC INSERTION PERITONEAL CATHETER         Home Medications    Prior to Admission medications   Not on File    Family History Family History  Problem Relation Age of Onset  . Hypertension Mother   . Mental illness Neg Hx     Social History Social History  Substance Use Topics  . Smoking status:  Former Smoker    Packs/day: 0.50    Types: Cigarettes    Quit date: 10/22/2015  . Smokeless tobacco: Never Used  . Alcohol use Yes     Allergies   Amoxicillin and Penicillins   Review of Systems Review of Systems  All other systems reviewed and are negative.    Physical Exam Updated Vital Signs BP (!) 109/52 (BP Location: Right Arm)   Pulse 93   Temp 98.2 F (36.8 C) (Oral)   Resp 14   SpO2 98%   Physical Exam  Constitutional: He appears well-developed and well-nourished. No distress.  HENT:  Head: Normocephalic and atraumatic.  Right Ear: External ear normal.  Left Ear: External ear normal.  Eyes: Conjunctivae are normal. Right eye exhibits no discharge. Left eye exhibits no discharge. No scleral icterus.  Neck: Neck supple. No tracheal deviation present.  Cardiovascular: Normal rate, regular rhythm and intact distal pulses.   Pulmonary/Chest: Effort normal and breath sounds normal. No stridor. No respiratory distress. He has no wheezes. He has no rales.  Abdominal: Soft. Bowel sounds are normal. He exhibits no distension. There is no tenderness. There is no rebound and no guarding.  Musculoskeletal: He exhibits no edema or tenderness.  Neurological: He is alert. He has normal strength. No cranial nerve deficit (no facial droop, extraocular movements intact, no slurred speech) or sensory deficit. He exhibits normal muscle tone. He displays no seizure activity. Coordination normal.  Skin: Skin is warm  and dry. No rash noted.  Psychiatric: His affect is blunt and inappropriate. His speech is not tangential. Thought content is delusional. He expresses inappropriate judgment. He expresses homicidal ideation. He expresses no suicidal ideation. He expresses no homicidal plans.  Nursing note and vitals reviewed.    ED Treatments / Results   Procedures Procedures (including critical care time)  Medications Ordered in ED Medications - No data to display   Initial  Impression / Assessment and Plan / ED Course  I have reviewed the triage vital signs and the nursing notes.  Pertinent labs & imaging results that were available during my care of the patient were reviewed by me and considered in my medical decision making (see chart for details).  Clinical Course as of Sep 08 2328  Wed Sep 08, 2016  2323 Labs are normal.  Pt is medically cleared for psychiatric evaluation.  [JK]    Clinical Course User Index [JK] Dorie Rank, MD      Final Clinical Impressions(s) / ED Diagnoses   Final diagnoses:  Delusions [facility])    New Prescriptions New Prescriptions   No medications on file     Dorie Rank, MD 09/08/16 2330

## 2016-09-09 MED ORDER — OLANZAPINE 10 MG PO TBDP
10.0000 mg | ORAL_TABLET | Freq: Every day | ORAL | Status: DC
  Administered 2016-09-09 – 2016-09-10 (×2): 10 mg via ORAL
  Filled 2016-09-09 (×2): qty 1

## 2016-09-09 MED ORDER — OLANZAPINE 10 MG PO TBDP: 10.0000 mg | ORAL_TABLET | Freq: Three times a day (TID) | ORAL | Status: DC | PRN

## 2016-09-09 MED ORDER — CARBAMAZEPINE 200 MG PO TABS
200.0000 mg | ORAL_TABLET | Freq: Two times a day (BID) | ORAL | Status: DC
  Administered 2016-09-09 – 2016-09-11 (×4): 200 mg via ORAL
  Filled 2016-09-09 (×4): qty 1

## 2016-09-09 MED ORDER — LORAZEPAM 0.5 MG PO TABS: 0.5000 mg | ORAL_TABLET | Freq: Four times a day (QID) | ORAL | Status: DC | PRN

## 2016-09-09 MED ORDER — OLANZAPINE 10 MG PO TBDP
10.0000 mg | ORAL_TABLET | Freq: Every day | ORAL | Status: DC
  Administered 2016-09-09: 10 mg via ORAL
  Filled 2016-09-09: qty 1

## 2016-09-09 NOTE — BH Assessment (Addendum)
[facility] Assessment Progress Note  Per Corena Pilgrim, MD, this pt requires psychiatric hospitalization at this time.  The following facilities have been contacted to seek placement for this pt, with results as noted:  Beds available, information sent, decision pending:  [place]:  [place]   At capacity:  [facility] [place], [place] Triage Specialist [phone]

## 2016-09-09 NOTE — BH Assessment (Signed)
Tele Assessment Note   Justin Chaney is a 21 y.o. male who presents voluntarily to [facility]. Pt states he is Justin Chaney, the anti-Christ and is here to take over the world. Pt states he uses children sacrifices, eats human flesh, and drinks their blood. Pt also states he is not human and that "they" gave him the name of Justin Chaney. Pt would not provide any detail about his new identify and stated, "I cannot tell you because all of this information is sacred." Pt denies suicidal ideation and states, "I am not here to kill myself but to sacrifice others." Pt states only seeing visual hallucinations. Pt has been admitted inpatient several times for similar symptoms.Pt  reports currently not taking his  medications and is not receiving outpatient services.   Pt denies any substance use self-injurious behaviors. Pt reports he is not depressed, he just wants people to understand his story.  Pt states he would be willing to come inpatient because he does not know if he can keep himself safe and from making sacrifices.    Diagnosis: Schizoaffective, Bipolar Type   Past Medical History:  Past Medical History:  Diagnosis Date  . Asthma   . Bipolar disorder ([place])   . 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 Depression   . Homelessness   . Schizophrenia ([place])   . Seizures ([place])     Past Surgical History:  Procedure Laterality Date  . TONSILLECTOMY    . VENTRICULO-PERITONEAL SHUNT PLACEMENT / LAPAROSCOPIC INSERTION PERITONEAL CATHETER      Family History:  Family History  Problem Relation Age of Onset  . Hypertension Mother   . Mental illness Neg Hx     Social History:  reports that he quit smoking about 10 months ago. His smoking use included Cigarettes. He smoked 0.50 packs per day. He has never used smokeless tobacco. He reports that he drinks alcohol. He reports that he uses drugs, including Marijuana, Other-see comments, and Amphetamines.  Additional Social  History:  Alcohol / Drug Use Pain Medications: pt denies abuse - see pta meds list Prescriptions: see MAR Over the Counter: pt denies abuse - see pta meds list History of alcohol / drug use?: No history of alcohol / drug abuse Longest period of sobriety (when/how long): unknown  CIWA: CIWA-Ar BP: (!) 94/52 Pulse Rate: 96 COWS:    PATIENT STRENGTHS: (choose at least two) Average or above average intelligence Capable of independent living Communication skills Financial means General fund of knowledge Motivation for treatment/growth  Allergies:  Allergies  Allergen Reactions  . Amoxicillin Hives and Other (See Comments)    Has patient had a PCN reaction causing immediate rash, facial/tongue/throat swelling, SOB or lightheadedness with hypotension: No Has patient had a PCN reaction causing severe rash involving mucus membranes or skin necrosis: No Has patient had a PCN reaction that required hospitalization No Has patient had a PCN reaction occurring within the last 10 years: No If all of the above answers are "NO", then may proceed with Cephalosporin use.  Marland Kitchen Penicillins Hives and Other (See Comments)    Has patient had a PCN reaction causing immediate rash, facial/tongue/throat swelling, SOB or lightheadedness with hypotension: No Has patient had a PCN reaction causing severe rash involving mucus membranes or skin necrosis: No Has patient had a PCN reaction that required hospitalization No Has patient had a PCN reaction occurring within the last 10 years: No If all of the above answers are "NO", then may proceed  with Cephalosporin use.    Home Medications:  (Not in a hospital admission)  OB/GYN Status:  No LMP for male patient.  General Assessment Data Location of Assessment: [facility] TTS Assessment: In system Is this a Tele or Face-to-Face Assessment?: Tele Assessment Is this an Initial Assessment or a Re-assessment for this encounter?: Initial Assessment Marital status:  Single Is patient pregnant?: No Pregnancy Status: No Living Arrangements: Other relatives Can pt return to current living arrangement?: No Admission Status: Voluntary Is patient capable of signing voluntary admission?: Yes Referral Source: Self/Family/Friend Insurance type: BCBS     Crisis Care Plan Living Arrangements: Other relatives Legal Guardian: Other: (self) Name of Psychiatrist: none  Name of Therapist: none   Education Status Is patient currently in school?: No Current Grade: na Highest grade of school patient has completed: unknown  Name of school: na Contact person: na  Risk to self with the past 6 months Suicidal Ideation: No Has patient been a risk to self within the past 6 months prior to admission? : No Suicidal Intent: No Has patient had any suicidal intent within the past 6 months prior to admission? : No Is patient at risk for suicide?: No Suicidal Plan?: No Has patient had any suicidal plan within the past 6 months prior to admission? : Other (comment) Access to Means: No What has been your use of drugs/alcohol within the last 12 months?: denies Previous Attempts/Gestures: No How many times?: 0 Other Self Harm Risks: denies Triggers for Past Attempts: None known Intentional Self Injurious Behavior: None Family Suicide History: Unknown Recent stressful life event(s): Trauma (Comment) Persecutory voices/beliefs?: No Depression: No Substance abuse history and/or treatment for substance abuse?: No Suicide prevention information given to non-admitted patients: Not applicable  Risk to Others within the past 6 months Homicidal Ideation: Yes-Currently Present Does patient have any lifetime risk of violence toward others beyond the six months prior to admission? : Yes (comment) Thoughts of Harm to Others: Yes-Currently Present Comment - Thoughts of Harm to Others: likes human sacrfices Current Homicidal Intent: Yes-Currently Present Current Homicidal  Plan: Yes-Currently Present Describe Current Homicidal Plan: kill everybody in the workd Access to Homicidal Means: No Identified Victim: the world History of harm to others?: No Assessment of Violence: In past 6-12 months Violent Behavior Description: na Does patient have access to weapons?: No Criminal Charges Pending?: No Does patient have a court date: No Is patient on probation?: No  Psychosis Hallucinations: Visual, Auditory Delusions: None noted  Mental Status Report Appearance/Hygiene: In scrubs, Unremarkable Eye Contact: Fair Motor Activity: Freedom of movement Speech: Logical/coherent Level of Consciousness: Alert Mood: Euphoric, Preoccupied Affect: Preoccupied, Depressed Anxiety Level: None Thought Processes: Irrelevant, Flight of Ideas Judgement: Impaired Orientation: Person, Place, Time, Situation Obsessive Compulsive Thoughts/Behaviors: None  Cognitive Functioning Concentration: Normal Memory: Recent Intact IQ: Below Average Level of Function: unknown  Insight: Poor Impulse Control: Fair Appetite: Good Weight Loss: 0 Weight Gain: 0 Sleep: Decreased Vegetative Symptoms: None  ADLScreening [facility] Assessment Services) Patient's cognitive ability adequate to safely complete daily activities?: Yes Patient able to express need for assistance with ADLs?: Yes Independently performs ADLs?: Yes (appropriate for developmental age)  Prior Inpatient Therapy Prior Inpatient Therapy: Yes Prior Therapy Dates: multiple Prior Therapy Facilty/Provider(s): several different locations Reason for Treatment: psychosis   Prior Outpatient Therapy Prior Outpatient Therapy: No Prior Therapy Dates: na Prior Therapy Facilty/Provider(s): na Reason for Treatment: na Does patient have an ACCT team?: No Does patient have Intensive In-House Services?  : No  Does patient have [facility] services? : No Does patient have P4CC services?: No  ADL Screening (condition at time of  admission) Patient's cognitive ability adequate to safely complete daily activities?: Yes Is the patient deaf or have difficulty hearing?: No Does the patient have difficulty seeing, even when wearing glasses/contacts?: No Does the patient have difficulty concentrating, remembering, or making decisions?: No Patient able to express need for assistance with ADLs?: Yes Does the patient have difficulty dressing or bathing?: No Independently performs ADLs?: Yes (appropriate for developmental age) Does the patient have difficulty walking or climbing stairs?: No Weakness of Legs: None Weakness of Arms/Hands: None  Home Assistive Devices/Equipment Home Assistive Devices/Equipment: None    Abuse/Neglect Assessment (Assessment to be complete while patient is alone) Physical Abuse: Denies Verbal Abuse: Denies Sexual Abuse: Denies Exploitation of patient/patient's resources: Denies Self-Neglect: Denies Values / Beliefs Cultural Requests During Hospitalization: None Spiritual Requests During Hospitalization: None   Advance Directives (For Healthcare) Does Patient Have a Medical Advance Directive?: No Would patient like information on creating a medical advance directive?: No - Patient declined    Additional Information 1:1 In Past 12 Months?: No CIRT Risk: No Elopement Risk: No Does patient have medical clearance?: Yes     Disposition: Gave clinical report to Patriciaann Clan, [place] who states pt meets inpatient criteria. TTS to seek placement.     Caryl Pina n Mazin Emma 09/09/2016 5:07 AM

## 2016-09-09 NOTE — Progress Notes (Signed)
09/09/16 1401:  LRT went to pt room to offer activities, pt was sleep.  Victorino Sparrow, LRT/CTRS

## 2016-09-09 NOTE — ED Notes (Signed)
Justin Chaney has been in the bed all day.  He smells bad of body odor.  He was given towels and shower supplies and has not yet used them.  He is eating all of his meals and taking medications as offered.

## 2016-09-09 NOTE — ED Notes (Signed)
Pt resting at present, no distress noted, calm & cooperative.  Monitoring for safety, Q 15 min checks in effect. 

## 2016-09-09 NOTE — ED Notes (Signed)
Pt presents with complaint of being HI towards others, obsessed with Satan.  A&O x 3, no distress noted, calm & cooperative at present.  Denies feeling hopeless.  Monitoring for safety, Q 15 min checks in effect.  Safety check for contraband completed, no items found.

## 2016-09-10 DIAGNOSIS — Z9889 Other specified postprocedural states: Secondary | ICD-10-CM

## 2016-09-10 DIAGNOSIS — Z8249 Family history of ischemic heart disease and other diseases of the circulatory system: Secondary | ICD-10-CM | POA: Diagnosis not present

## 2016-09-10 DIAGNOSIS — Z888 Allergy status to other drugs, medicaments and biological substances status: Secondary | ICD-10-CM | POA: Diagnosis not present

## 2016-09-10 DIAGNOSIS — F250 Schizoaffective disorder, bipolar type: Secondary | ICD-10-CM | POA: Diagnosis not present

## 2016-09-10 DIAGNOSIS — Z880 Allergy status to penicillin: Secondary | ICD-10-CM | POA: Diagnosis not present

## 2016-09-10 DIAGNOSIS — Z87891 Personal history of nicotine dependence: Secondary | ICD-10-CM | POA: Diagnosis not present

## 2016-09-10 DIAGNOSIS — F1290 Cannabis use, unspecified, uncomplicated: Secondary | ICD-10-CM | POA: Diagnosis not present

## 2016-09-10 DIAGNOSIS — R443 Hallucinations, unspecified: Secondary | ICD-10-CM | POA: Diagnosis not present

## 2016-09-10 DIAGNOSIS — Z79899 Other long term (current) drug therapy: Secondary | ICD-10-CM | POA: Diagnosis not present

## 2016-09-10 NOTE — ED Notes (Signed)
Hourly rounding reveals patient rest in room. No complaints, stable, in no acute distress. Q15 minute rounds and monitoring via [employer] to continue.

## 2016-09-10 NOTE — ED Notes (Signed)
Hourly rounding reveals patient in rest room. No complaints, stable, in no acute distress. Q15 minute rounds and monitoring via Security Cameras to continue. 

## 2016-09-10 NOTE — Progress Notes (Signed)
09/10/16 1336:  LRT went to pt room to offer activities, pt was sleep.  Victorino Sparrow, LRT/CTRS

## 2016-09-10 NOTE — ED Notes (Signed)
Hourly rounding reveals patient sleeping in room. No complaints, stable, in no acute distress. Q15 minute rounds and monitoring via Security Cam[employer] to continue. 

## 2016-09-10 NOTE — ED Notes (Signed)
Report to include Situation, Background, Assessment, and recommendations received from [place]. Patient alert and oriented, warm and dry, in no acute distress. Patient denies SI, HI, and pain. Patient states he hears and sees "Demons" without command. Patient made aware of Q15 minute rounds and security cameras for their safety. Patient instructed to come to me with needs or concerns.

## 2016-09-10 NOTE — BHH Counselor (Signed)
Clinician received a call from [place] from [place] requesting additional information. Clinician fax pt's medication list to the following number [phone].  Edd Fabian, MS, [facility], [facility] Triage Specialist [phone]

## 2016-09-10 NOTE — ED Notes (Signed)
Pt told me to come back later to get his vitals

## 2016-09-10 NOTE — Consult Note (Signed)
[facility] Psychiatry Consult   Reason for Consult:  Patient states that he is "Justin Chaney." Referring Physician:  EDP Patient Identification: Justin Chaney MRN:  [id number] Principal Diagnosis: Schizoaffective disorder, bipolar type ([place]) Diagnosis:   Patient Active Problem List   Diagnosis Date Noted  . Schizoaffective disorder, bipolar type ([place]) [F25.0] 09/04/2015    Priority: High  . Suicidal ideations [R45.851]   . Hallucinations [R44.3]   . Homicidal ideation [R45.850]   . Suicidal thoughts [R45.851]   . Cocaine use disorder, severe, dependence ([place]) [F14.20] 11/07/2015  . 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 Cannabis use disorder, moderate, in sustained remission ([place]) [F12.21] 11/07/2015  . Intermittent explosive disorder [F63.81] 09/05/2015  . Non compliance w medication regimen [Z91.14] 01/17/2015    Total Time spent with patient: 45 minutes  Subjective:   Justin Chaney is a 21 y.o. male patient admitted with delusions.  HPI:  Justin Chaney is a 21 y.o. male who presents voluntarily to [facility].  Patient is non verbal and refused to speak with medical staff.  It was reported that upon admission, patient  stated he was Justin Chaney, the anti-Christ and is here to take over the world, he uses children sacrifices, eats human flesh, and drinks their blood. Pt also states he is not human and that "they" gave him the name of Justin Chaney.  Pt would not provide any detail about his new identify and stated, "I cannot tell you because all of this information is sacred." Pt denies suicidal ideation and states, "I am not here to kill myself but to sacrifice others." Pt states only seeing visual hallucinations. Pt has been admitted inpatient several times for similar symptoms.Pt  reports currently not taking his  medications and is not receiving outpatient services.   Past Psychiatric History: see HPI  Risk to Self: Suicidal Ideation:  No Suicidal Intent: No Is patient at risk for suicide?: No Suicidal Plan?: No Access to Means: No What has been your use of drugs/alcohol within the last 12 months?: denies How many times?: 0 Other Self Harm Risks: denies Triggers for Past Attempts: None known Intentional Self Injurious Behavior: None Risk to Others: Homicidal Ideation: Yes-Currently Present Thoughts of Harm to Others: Yes-Currently Present Comment - Thoughts of Harm to Others: likes human sacrfices Current Homicidal Intent: Yes-Currently Present Current Homicidal Plan: Yes-Currently Present Describe Current Homicidal Plan: kill everybody in the workd Access to Homicidal Means: No Identified Victim: the world History of harm to others?: No Assessment of Violence: In past 6-12 months Violent Behavior Description: na Does patient have access to weapons?: No Criminal Charges Pending?: No Does patient have a court date: No Prior Inpatient Therapy: Prior Inpatient Therapy: Yes Prior Therapy Dates: multiple Prior Therapy Facilty/Provider(s): several different locations Reason for Treatment: psychosis  Prior Outpatient Therapy: Prior Outpatient Therapy: No Prior Therapy Dates: na Prior Therapy Facilty/Provider(s): na Reason for Treatment: na Does patient have an ACCT team?: No Does patient have Intensive In-House Services?  : No Does patient have [facility] services? : No Does patient have P4CC services?: No  Past Medical History:  Past Medical History:  Diagnosis Date  . Asthma   . Bipolar disorder ([place])   . 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 Depression   . Homelessness   . Schizophrenia ([place])   . Seizures ([place])     Past Surgical History:  Procedure Laterality Date  . TONSILLECTOMY    . VENTRICULO-PERITONEAL SHUNT PLACEMENT / LAPAROSCOPIC INSERTION  PERITONEAL CATHETER     Family History:  Family History  Problem Relation Age of Onset  . Hypertension Mother   . Mental illness  Neg Hx    Family Psychiatric  History: see HPI Social History:  History  Alcohol Use  . Yes     History  Drug Use  . Types: Marijuana, Other-see comments, Amphetamines    Comment: Adderall, "I don't do weed, only rarely"    Social History   Social History  . Marital status: Single    Spouse name: N/A  . Number of children: N/A  . Years of education: N/A   Social History Main Topics  . Smoking status: Former Smoker    Packs/day: 0.50    Types: Cigarettes    Quit date: 10/22/2015  . Smokeless tobacco: Never Used  . Alcohol use Yes  . Drug use: Yes    Types: Marijuana, Other-see comments, Amphetamines     Comment: Adderall, "I don't do weed, only rarely"  . Sexual activity: Not Asked   Other Topics Concern  . None   Social History Narrative  . None   Additional Social History:    Allergies:   Allergies  Allergen Reactions  . Amoxicillin Hives and Other (See Comments)    Has patient had a PCN reaction causing immediate rash, facial/tongue/throat swelling, SOB or lightheadedness with hypotension: No Has patient had a PCN reaction causing severe rash involving mucus membranes or skin necrosis: No Has patient had a PCN reaction that required hospitalization No Has patient had a PCN reaction occurring within the last 10 years: No If all of the above answers are "NO", then may proceed with Cephalosporin use.  Marland Kitchen Penicillins Hives and Other (See Comments)    Has patient had a PCN reaction causing immediate rash, facial/tongue/throat swelling, SOB or lightheadedness with hypotension: No Has patient had a PCN reaction causing severe rash involving mucus membranes or skin necrosis: No Has patient had a PCN reaction that required hospitalization No Has patient had a PCN reaction occurring within the last 10 years: No If all of the above answers are "NO", then may proceed with Cephalosporin use.    Labs:  Results for orders placed or performed during the hospital encounter  of 09/08/16 (from the past 48 hour(s))  Rapid urine drug screen (hospital performed)     Status: None   Collection Time: 09/08/16  9:42 PM  Result Value Ref Range   Opiates NONE DETECTED NONE DETECTED   Cocaine NONE DETECTED NONE DETECTED   Benzodiazepines NONE DETECTED NONE DETECTED   Amphetamines NONE DETECTED NONE DETECTED   Tetrahydrocannabinol NONE DETECTED NONE DETECTED   Barbiturates NONE DETECTED NONE DETECTED    Comment:        DRUG SCREEN FOR MEDICAL PURPOSES ONLY.  IF CONFIRMATION IS NEEDED FOR ANY PURPOSE, NOTIFY LAB WITHIN 5 DAYS.        LOWEST DETECTABLE LIMITS FOR URINE DRUG SCREEN Drug Class       Cutoff (ng/mL) Amphetamine      1000 Barbiturate      200 Benzodiazepine   588 Tricyclics       325 Opiates          300 Cocaine          300 THC              50   Comprehensive metabolic panel     Status: Abnormal   Collection Time: 09/08/16 10:06 PM  Result Value Ref Range  Sodium 139 135 - 145 mmol/L   Potassium 3.7 3.5 - 5.1 mmol/L   Chloride 108 101 - 111 mmol/L   CO2 24 22 - 32 mmol/L   Glucose, Bld 103 (H) 65 - 99 mg/dL   BUN 13 6 - 20 mg/dL   Creatinine, Ser 0.96 0.61 - 1.24 mg/dL   Calcium 9.3 8.9 - 10.3 mg/dL   Total Protein 7.5 6.5 - 8.1 g/dL   Albumin 4.2 3.5 - 5.0 g/dL   AST 25 15 - 41 U/L   ALT 26 17 - 63 U/L   Alkaline Phosphatase 89 38 - 126 U/L   Total Bilirubin 0.4 0.3 - 1.2 mg/dL   GFR calc non Af Amer >60 >60 mL/min   GFR calc Af Amer >60 >60 mL/min    Comment: (NOTE) The eGFR has been calculated using the CKD EPI equation. This calculation has not been validated in all clinical situations. eGFR's persistently <60 mL/min signify possible Chronic Kidney Disease.    Anion gap 7 5 - 15  Ethanol     Status: None   Collection Time: 09/08/16 10:06 PM  Result Value Ref Range   Alcohol, Ethyl (B) <5 <5 mg/dL    Comment:        LOWEST DETECTABLE LIMIT FOR SERUM ALCOHOL IS 5 mg/dL FOR MEDICAL PURPOSES ONLY   Salicylate level      Status: None   Collection Time: 09/08/16 10:06 PM  Result Value Ref Range   Salicylate Lvl <7.0 2.8 - 30.0 mg/dL  Acetaminophen level     Status: Abnormal   Collection Time: 09/08/16 10:06 PM  Result Value Ref Range   Acetaminophen (Tylenol), Serum <10 (L) 10 - 30 ug/mL    Comment:        THERAPEUTIC CONCENTRATIONS VARY SIGNIFICANTLY. A RANGE OF 10-30 ug/mL MAY BE AN EFFECTIVE CONCENTRATION FOR MANY PATIENTS. HOWEVER, SOME ARE BEST TREATED AT CONCENTRATIONS OUTSIDE THIS RANGE. ACETAMINOPHEN CONCENTRATIONS >150 ug/mL AT 4 HOURS AFTER INGESTION AND >50 ug/mL AT 12 HOURS AFTER INGESTION ARE OFTEN ASSOCIATED WITH TOXIC REACTIONS.   cbc     Status: None   Collection Time: 09/08/16 10:06 PM  Result Value Ref Range   WBC 8.8 4.0 - 10.5 K/uL   RBC 5.27 4.22 - 5.81 MIL/uL   Hemoglobin 14.5 13.0 - 17.0 g/dL   HCT 43.3 39.0 - 52.0 %   MCV 82.2 78.0 - 100.0 fL   MCH 27.5 26.0 - 34.0 pg   MCHC 33.5 30.0 - 36.0 g/dL   RDW 12.6 11.5 - 15.5 %   Platelets 257 150 - 400 K/uL    Current Facility-Administered Medications  Medication Dose Route Frequency Provider Last Rate Last Dose  . carbamazepine (TEGRETOL) tablet 200 mg  200 mg Oral BID PC Cary Lothrop, MD   200 mg at 09/10/16 0927  . LORazepam (ATIVAN) tablet 0.5 mg  0.5 mg Oral Q6H PRN Dorie Rank, MD      . OLANZapine zydis (ZYPREXA) disintegrating tablet 10 mg  10 mg Oral QHS Corena Pilgrim, MD   10 mg at 09/09/16 2142  . OLANZapine zydis (ZYPREXA) disintegrating tablet 10 mg  10 mg Oral Q8H PRN Corena Pilgrim, MD       No current outpatient prescriptions on file.    Musculoskeletal: Strength & Muscle Tone: within normal limits Gait & Station: normal Patient leans: N/A  Psychiatric Specialty Exam: Physical Exam  Nursing note and vitals reviewed.   ROS  Blood pressure 111/58, pulse 90,  temperature 98.1 F (36.7 C), temperature source Oral, resp. rate 16, SpO2 98 %.There is no height or weight on file to calculate BMI.   General Appearance: Disheveled  Eye Contact:  Poor  Speech:  Garbled  Volume:  non verbal  Mood:  Depressed  Affect:  Constricted  Thought Process:  Descriptions of Associations: Tangential  Orientation:  Other:  refusing to speak  Thought Content:  Delusions  Suicidal Thoughts:  No  Homicidal Thoughts:  Upon admission states that he uses children as human sacrifice  Memory:  Immediate;   Poor Recent;   Poor Remote;   Poor  Judgement:  Poor  Insight:  Lacking  Psychomotor Activity:  Normal  Concentration:  Concentration: Fair and Attention Span: Fair  Recall:  [employer] of Knowledge:  Fair  Language:  Fair, not speaking at this time  Akathisia:  Negative  Handed:  Right  AIMS (if indicated):     Assets:  Resilience  ADL's:  Impaired  Cognition:  Impaired,  Moderate  Sleep:      Treatment Plan Summary: Daily contact with patient to assess and evaluate symptoms and progress in treatment, Medication management and Plan seeking inpatient bed, 500 hall  Disposition: Recommend psychiatric Inpatient admission when medically cleared. Supportive therapy provided about ongoing stressors. need inpatient bed   J [facility], NP [facility] 09/10/2016 3:48 PM  Patient seen face-to-face for psychiatric evaluation, chart reviewed and case discussed with the physician extender and developed treatment plan. Reviewed the information documented and agree with the treatment plan. Corena Pilgrim, MD

## 2016-09-11 DIAGNOSIS — Z880 Allergy status to penicillin: Secondary | ICD-10-CM | POA: Diagnosis not present

## 2016-09-11 DIAGNOSIS — Z888 Allergy status to other drugs, medicaments and biological substances status: Secondary | ICD-10-CM

## 2016-09-11 DIAGNOSIS — R45850 Homicidal ideations: Secondary | ICD-10-CM

## 2016-09-11 DIAGNOSIS — R443 Hallucinations, unspecified: Secondary | ICD-10-CM | POA: Diagnosis not present

## 2016-09-11 DIAGNOSIS — F250 Schizoaffective disorder, bipolar type: Secondary | ICD-10-CM

## 2016-09-11 DIAGNOSIS — Z79899 Other long term (current) drug therapy: Secondary | ICD-10-CM | POA: Diagnosis not present

## 2016-09-11 DIAGNOSIS — Z818 Family history of other mental and behavioral disorders: Secondary | ICD-10-CM | POA: Diagnosis not present

## 2016-09-11 DIAGNOSIS — Z87891 Personal history of nicotine dependence: Secondary | ICD-10-CM | POA: Diagnosis not present

## 2016-09-11 DIAGNOSIS — R45851 Suicidal ideations: Secondary | ICD-10-CM | POA: Diagnosis not present

## 2016-09-11 DIAGNOSIS — F1290 Cannabis use, unspecified, uncomplicated: Secondary | ICD-10-CM | POA: Diagnosis not present

## 2016-09-11 DIAGNOSIS — Z8249 Family history of ischemic heart disease and other diseases of the circulatory system: Secondary | ICD-10-CM | POA: Diagnosis not present

## 2016-09-11 DIAGNOSIS — F1590 Other stimulant use, unspecified, uncomplicated: Secondary | ICD-10-CM | POA: Diagnosis not present

## 2016-09-11 MED ORDER — OLANZAPINE 10 MG PO TBDP: 10.0000 mg | ORAL_TABLET | Freq: Every day | ORAL | 0 refills | Status: DC

## 2016-09-11 MED ORDER — CARBAMAZEPINE 200 MG PO TABS: 200.0000 mg | ORAL_TABLET | Freq: Two times a day (BID) | ORAL | 0 refills | Status: DC

## 2016-09-11 NOTE — ED Notes (Signed)
Hourly rounding reveals patient sleeping in room. No complaints, stable, in no acute distress. Q15 minute rounds and monitoring via Security Cam[employer] to continue. 

## 2016-09-11 NOTE — ED Notes (Signed)
This patient is pleasant and cooperative.  He continues to ask to be discharged even though he agreed with Psychiatrist to stay another day.  He is compliant with his medications.  He continues to have audio and visual hallucinations.  15 minute checks and video monitoring in place.

## 2016-09-11 NOTE — B[facility] Suicide Risk Assessment (Signed)
   [facility] Discharge Suicide Risk Assessment   Principal Problem: Schizoaffective disorder, bipolar type [facility]) Discharge Diagnoses:  Patient Active Problem List   Diagnosis Date Noted  . Suicidal ideations [R45.851]   . Hallucinations [R44.3]   . Homicidal ideation [R45.850]   . Suicidal thoughts [R45.851]   . Cocaine use disorder, severe, dependence ([place]) [F14.20] 11/07/2015  . Amphetamine use disorder, severe, dependence ([place]) [F15.20] 11/07/2015  . Cannabis use disorder, moderate, in sustained remission ([place]) [F12.21] 11/07/2015  . Intermittent explosive disorder [F63.81] 09/05/2015  . Schizoaffective disorder, bipolar type ([place]) [F25.0] 09/04/2015  . Non compliance w medication regimen [Z91.14] 01/17/2015    Total Time spent with patient: 30 minutes  Musculoskeletal: Strength & Muscle Tone: within normal limits Gait & Station: normal Patient leans: N/A  Psychiatric Specialty Exam:   Blood pressure 119/65, pulse 100, temperature 98 F (36.7 C), temperature source Oral, resp. rate 16, SpO2 95 %.There is no height or weight on file to calculate BMI.   General Appearance: Casual and Fairly Groomed  Eye Contact:  Good  Speech:  Clear and Coherent and Normal Rate  Volume:  Normal  Mood:  Depressed  Affect:  Constricted  Thought Process: linear, logical, goal-directed   Orientation: Full, A/O x 3   Thought Content:  Focused on discharge home  Suicidal Thoughts:  No  Homicidal Thoughts:  Denies  Memory:  Immediate;   Fair Recent;   Fair Remote;   Fair  Judgement:  Fair  Insight:  Lacking  Psychomotor Activity:  Normal  Concentration:  Concentration: Fair and Attention Span: Fair  Recall:  [employer] of Knowledge:  Fair  Language:  Fair, not speaking at this time  Akathisia:  Negative  Handed:  Right   AIMS (if indicated):     Assets:  Resilience  ADL's:  Impaired  Cognition:  Impaired,  Moderate  Sleep:       Mental Status Per Nursing Assessment::   On  Admission:     Demographic Factors:  Adolescent or young adult  Loss Factors: Legal issues  Historical Factors: Impulsivity  Risk Reduction Factors:   Positive social support, Positive therapeutic relationship and Positive coping skills or problem solving skills  Continued Clinical Symptoms:  Depression:   Impulsivity  Cognitive Features That Contribute To Risk:  Closed-mindedness    Suicide Risk:  Minimal: No identifiable suicidal ideation.  Patients presenting with no risk factors but with morbid ruminations; may be classified as minimal risk based on the severity of the depressive symptoms    Plan Of Care/Follow-up recommendations:  Activity:  As tolerated Diet:  Heart healthy with low sodium.  Benjamine Mola, FNP 09/11/2016, 4:41 PM

## 2016-09-11 NOTE — Consult Note (Signed)
[facility] Psychiatry Consult   Reason for Consult:  Patient states that he is "[name]." Referring Physician:  EDP Patient Identification: Justin Chaney MRN:  [id number] Principal Diagnosis: Schizoaffective disorder, bipolar type ([place]) Diagnosis:   Patient Active Problem List   Diagnosis Date Noted  . Suicidal ideations [R45.851]   . Hallucinations [R44.3]   . Homicidal ideation [R45.850]   . Suicidal thoughts [R45.851]   . Cocaine use disorder, severe, dependence ([place]) [F14.20] 11/07/2015  . Amphetamine use disorder, severe, dependence ([place]) [F15.20] 11/07/2015  . Cannabis use disorder, moderate, in sustained remission ([place]) [F12.21] 11/07/2015  . Intermittent explosive disorder [F63.81] 09/05/2015  . Schizoaffective disorder, bipolar type ([place]) [F25.0] 09/04/2015  . Non compliance w medication regimen [Z91.14] 01/17/2015    Total Time spent with patient: 45 minutes  Subjective:   Justin Chaney is a 21 y.o. male patient admitted with delusions. Pt seen and chart reviewed with Dr. Louretta Shorten and treatment team this AM. Pt is alert/oriented x4, calm, cooperative, and appropriate to situation. Pt denies suicidal/homicidal ideation and psychosis and does not appear to be responding to internal stimuli. Pt reports a major improvement in symptom and now denies any delusions or hallucinations and would like to discharge home.   HPI:  Justin Chaney is a 21 y.o. male who presents voluntarily to [facility].  Patient is non verbal and refused to speak with medical staff.  It was reported that upon admission, patient  stated he was [name], the anti-Christ and is here to take over the world, he uses children sacrifices, eats human flesh, and drinks their blood. Pt also states he is not human and that "they" gave him the name of Justin Chaney.  Pt would not provide any detail about his new identify and stated, "I cannot tell you because all of this information is sacred." Pt denies  suicidal ideation and states, "I am not here to kill myself but to sacrifice others." Pt states only seeing visual hallucinations. Pt has been admitted inpatient several times for similar symptoms.Pt  reports currently not taking his  medications and is not receiving outpatient services.  Past Psychiatric History: see HPI  Risk to Self: Suicidal Ideation: No Suicidal Intent: No Is patient at risk for suicide?: No Suicidal Plan?: No Access to Means: No What has been your use of drugs/alcohol within the last 12 months?: denies How many times?: 0 Other Self Harm Risks: denies Triggers for Past Attempts: None known Intentional Self Injurious Behavior: None Risk to Others: Homicidal Ideation: Yes-Currently Present Thoughts of Harm to Others: Yes-Currently Present Comment - Thoughts of Harm to Others: likes human sacrfices Current Homicidal Intent: Yes-Currently Present Current Homicidal Plan: Yes-Currently Present Describe Current Homicidal Plan: kill everybody in the workd Access to Homicidal Means: No Identified Victim: the world History of harm to others?: No Assessment of Violence: In past 6-12 months Violent Behavior Description: na Does patient have access to weapons?: No Criminal Charges Pending?: No Does patient have a court date: No Prior Inpatient Therapy: Prior Inpatient Therapy: Yes Prior Therapy Dates: multiple Prior Therapy Facilty/Provider(s): several different locations Reason for Treatment: psychosis  Prior Outpatient Therapy: Prior Outpatient Therapy: No Prior Therapy Dates: na Prior Therapy Facilty/Provider(s): na Reason for Treatment: na Does patient have an ACCT team?: No Does patient have Intensive In-House Services?  : No Does patient have [facility] services? : No Does patient have P4CC services?: No  Past Medical History:  Past Medical History:  Diagnosis Date  . Asthma   . Bipolar disorder (  [place])   . 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 Depression   . Homelessness   . Schizophrenia ([place])   . Seizures ([place])     Past Surgical History:  Procedure Laterality Date  . TONSILLECTOMY    . VENTRICULO-PERITONEAL SHUNT PLACEMENT / LAPAROSCOPIC INSERTION PERITONEAL CATHETER     Family History:  Family History  Problem Relation Age of Onset  . Hypertension Mother   . Mental illness Neg Hx    Family Psychiatric  History: see HPI Social History:  History  Alcohol Use  . Yes     History  Drug Use  . Types: Marijuana, Other-see comments, Amphetamines    Comment: Adderall, "I don't do weed, only rarely"    Social History   Social History  . Marital status: Single    Spouse name: N/A  . Number of children: N/A  . Years of education: N/A   Social History Main Topics  . Smoking status: Former Smoker    Packs/day: 0.50    Types: Cigarettes    Quit date: 10/22/2015  . Smokeless tobacco: Never Used  . Alcohol use Yes  . Drug use: Yes    Types: Marijuana, Other-see comments, Amphetamines     Comment: Adderall, "I don't do weed, only rarely"  . Sexual activity: Not Asked   Other Topics Concern  . None   Social History Narrative  . None   Additional Social History:    Allergies:   Allergies  Allergen Reactions  . Amoxicillin Hives and Other (See Comments)    Has patient had a PCN reaction causing immediate rash, facial/tongue/throat swelling, SOB or lightheadedness with hypotension: No Has patient had a PCN reaction causing severe rash involving mucus membranes or skin necrosis: No Has patient had a PCN reaction that required hospitalization No Has patient had a PCN reaction occurring within the last 10 years: No If all of the above answers are "NO", then may proceed with Cephalosporin use.  Marland Kitchen Penicillins Hives and Other (See Comments)    Has patient had a PCN reaction causing immediate rash, facial/tongue/throat swelling, SOB or lightheadedness with hypotension: No Has patient had a PCN  reaction causing severe rash involving mucus membranes or skin necrosis: No Has patient had a PCN reaction that required hospitalization No Has patient had a PCN reaction occurring within the last 10 years: No If all of the above answers are "NO", then may proceed with Cephalosporin use.    Labs:  No results found for this or any previous visit (from the past 48 hour(s)).  Current Facility-Administered Medications  Medication Dose Route Frequency Provider Last Rate Last Dose  . carbamazepine (TEGRETOL) tablet 200 mg  200 mg Oral BID PC Mojeed Akintayo, MD   200 mg at 09/11/16 0930  . LORazepam (ATIVAN) tablet 0.5 mg  0.5 mg Oral Q6H PRN Dorie Rank, MD      . OLANZapine zydis (ZYPREXA) disintegrating tablet 10 mg  10 mg Oral QHS Corena Pilgrim, MD   10 mg at 09/10/16 2205  . OLANZapine zydis (ZYPREXA) disintegrating tablet 10 mg  10 mg Oral Q8H PRN Corena Pilgrim, MD       Current Outpatient Prescriptions  Medication Sig Dispense Refill  . carbamazepine (TEGRETOL) 200 MG tablet Take 1 tablet (200 mg total) by mouth 2 (two) times daily after a meal. 60 tablet 0  . OLANZapine zydis (ZYPREXA) 10 MG disintegrating tablet Take 1 tablet (10 mg total) by mouth at bedtime.  30 tablet 0    Musculoskeletal: Strength & Muscle Tone: within normal limits Gait & Station: normal Patient leans: N/A  Psychiatric Specialty Exam: Physical Exam  Nursing note and vitals reviewed.   Review of Systems  Psychiatric/Behavioral: Positive for depression. Negative for hallucinations and suicidal ideas. The patient is nervous/anxious and has insomnia.   All other systems reviewed and are negative.   Blood pressure 119/65, pulse 100, temperature 98 F (36.7 C), temperature source Oral, resp. rate 16, SpO2 95 %.There is no height or weight on file to calculate BMI.  General Appearance: Casual and Fairly Groomed  Eye Contact:  Good  Speech:  Clear and Coherent and Normal Rate  Volume:  Normal  Mood:   Depressed  Affect:  Constricted  Thought Process: linear, logical, goal-directed   Orientation: Full, A/O x 3   Thought Content:  Focused on discharge home  Suicidal Thoughts:  No  Homicidal Thoughts:  Upon admission states that he uses children as human sacrifice  Memory:  Immediate;   Fair Recent;   Fair Remote;   Fair  Judgement:  Fair  Insight:  Lacking  Psychomotor Activity:  Normal  Concentration:  Concentration: Fair and Attention Span: Fair  Recall:  [employer] of Knowledge:  Fair  Language:  Fair, not speaking at this time  Akathisia:  Negative  Handed:  Right   AIMS (if indicated):     Assets:  Resilience  ADL's:  Impaired  Cognition:  Impaired,  Moderate  Sleep:      Treatment Plan Summary: Schizoaffective disorder, bipolar type ([place]) stable for outpatient management, treated s below:   Medications:  -Tegretol 200mg  po bid for mood stabilization -Zyprexa 10mg  po qhs for psychosis  Disposition: Recommend psychiatric Inpatient admission when medically cleared. Supportive therapy provided about ongoing stressors. need inpatient bed   Benjamine Mola, FNP [facility] 09/11/2016 4:15 PM   Patient seen face-to-face for this evaluation, case discussed with treatment team and physician extender. Formulated treatment plan. Reviewed the information documented and agree with the treatment plan.  Farzad Tibbetts 09/11/2016 5:12 PM

## 2016-09-11 NOTE — ED Notes (Signed)
Pt discharged ambulatory.  Discharge instructions and RX were given and reviewed.  All belongings were returned.

## 2016-09-14 ENCOUNTER — Emergency Department (HOSPITAL_COMMUNITY)
Admission: EM | Admit: 2016-09-14 | Discharge: 2016-09-15 | Disposition: A | Discharge status: Other Acute Inpatient Hospital | Payer: Medicaid Other | Attending: Emergency Medicine | Admitting: Emergency Medicine

## 2016-09-14 ENCOUNTER — Encounter (HOSPITAL_COMMUNITY): Payer: Self-pay | Admitting: Emergency Medicine

## 2016-09-14 DIAGNOSIS — R45850 Homicidal ideations: Secondary | ICD-10-CM

## 2016-09-14 DIAGNOSIS — Z9114 Patient's other noncompliance with medication regimen: Secondary | ICD-10-CM

## 2016-09-14 DIAGNOSIS — R45851 Suicidal ideations: Secondary | ICD-10-CM | POA: Diagnosis not present

## 2016-09-14 DIAGNOSIS — F250 Schizoaffective disorder, bipolar type: Secondary | ICD-10-CM | POA: Diagnosis present

## 2016-09-14 DIAGNOSIS — Z79899 Other long term (current) drug therapy: Secondary | ICD-10-CM | POA: Insufficient documentation

## 2016-09-14 DIAGNOSIS — F29 Unspecified psychosis not due to a substance or known physiological condition: Secondary | ICD-10-CM | POA: Insufficient documentation

## 2016-09-14 DIAGNOSIS — Z87891 Personal history of nicotine dependence: Secondary | ICD-10-CM | POA: Insufficient documentation

## 2016-09-14 DIAGNOSIS — Z91148 Patient's other noncompliance with medication regimen for other reason: Secondary | ICD-10-CM

## 2016-09-14 DIAGNOSIS — J45909 Unspecified asthma, uncomplicated: Secondary | ICD-10-CM | POA: Diagnosis not present

## 2016-09-14 DIAGNOSIS — R443 Hallucinations, unspecified: Secondary | ICD-10-CM | POA: Diagnosis not present

## 2016-09-14 LAB — COMPREHENSIVE METABOLIC PANEL
ALT: 23 U/L (ref 17–63)
AST: 28 U/L (ref 15–41)
Albumin: 3.8 g/dL (ref 3.5–5.0)
Alkaline Phosphatase: 37 U/L — ABNORMAL LOW (ref 38–126)
Anion gap: 11 (ref 5–15)
BUN: 8 mg/dL (ref 6–20)
CO2: 26 mmol/L (ref 22–32)
Calcium: 9.5 mg/dL (ref 8.9–10.3)
Chloride: 104 mmol/L (ref 101–111)
Creatinine, Ser: 0.77 mg/dL (ref 0.61–1.24)
GFR calc Af Amer: >60 mL/min (ref >60)
GFR calc non Af Amer: >60 mL/min (ref >60)
Glucose, Bld: 110 mg/dL — ABNORMAL HIGH (ref 65–99)
Potassium: 4 mmol/L (ref 3.5–5.1)
Sodium: 141 mmol/L (ref 135–145)
Total Bilirubin: 0.4 mg/dL (ref 0.3–1.2)
Total Protein: 7.1 g/dL (ref 6.5–8.1)

## 2016-09-14 LAB — RAPID URINE DRUG SCREEN, HOSP PERFORMED
Amphetamines: NOT DETECTED
Barbiturates: NOT DETECTED
Benzodiazepines: NOT DETECTED
Cocaine: NOT DETECTED
Opiates: NOT DETECTED
Tetrahydrocannabinol: NOT DETECTED

## 2016-09-14 LAB — SALICYLATE LEVEL: Salicylate Lvl: <7 mg/dL (ref 2.8–30.0)

## 2016-09-14 LAB — CBC
HCT: 44.6 % (ref 39.0–52.0)
Hemoglobin: 14.9 g/dL (ref 13.0–17.0)
MCH: 27.6 pg (ref 26.0–34.0)
MCHC: 33.4 g/dL (ref 30.0–36.0)
MCV: 82.7 fL (ref 78.0–100.0)
Platelets: 248 10*3/uL (ref 150–400)
RBC: 5.39 MIL/uL (ref 4.22–5.81)
RDW: 12.5 % (ref 11.5–15.5)
WBC: 7.3 10*3/uL (ref 4.0–10.5)

## 2016-09-14 LAB — ACETAMINOPHEN LEVEL: Acetaminophen (Tylenol), Serum: <10 ug/mL — ABNORMAL LOW (ref 10–30)

## 2016-09-14 LAB — ETHANOL: Alcohol, Ethyl (B): <5 mg/dL (ref <5)

## 2016-09-14 MED ORDER — NICOTINE 21 MG/24HR TD PT24
21.0000 mg | MEDICATED_PATCH | Freq: Every day | TRANSDERMAL | Status: DC
  Administered 2016-09-15: 21 mg via TRANSDERMAL

## 2016-09-14 MED ORDER — ALUM & MAG HYDROXIDE-SIMETH 200-200-20 MG/5ML PO SUSP: 30.0000 mL | ORAL | Status: DC | PRN

## 2016-09-14 MED ORDER — ACETAMINOPHEN 325 MG PO TABS: 650.0000 mg | ORAL_TABLET | ORAL | Status: DC | PRN

## 2016-09-14 MED ORDER — IBUPROFEN 400 MG PO TABS: 600.0000 mg | ORAL_TABLET | Freq: Three times a day (TID) | ORAL | Status: DC | PRN

## 2016-09-14 MED ORDER — LORAZEPAM 1 MG PO TABS
1.0000 mg | ORAL_TABLET | Freq: Three times a day (TID) | ORAL | Status: DC | PRN
  Administered 2016-09-15: 1 mg via ORAL
  Filled 2016-09-14: qty 1

## 2016-09-14 MED ORDER — ONDANSETRON HCL 4 MG PO TABS: 4.0000 mg | ORAL_TABLET | Freq: Three times a day (TID) | ORAL | Status: DC | PRN

## 2016-09-14 NOTE — ED Triage Notes (Signed)
Patient states "I am a sadist. I drink blood. I eat Flesh. I believe in sacrificing children. I believe in sacrificing men and women. I need help". Reports this has been a constant problem for him. States "I am fucked up in the head". Denies SI, but also states "I hear voices and they tell me to do things". Endorses HI. States: "I want to be admitted to the psychiatric hospital". Patient advised of ED process. He is calm and cooperative. Changed into scrubs. Labs Collected. Charge informed. Security advised of need for metal detector screening.

## 2016-09-14 NOTE — Progress Notes (Signed)
Contacted pt. RN to order cart for TTS. Currently having problems with cart. Birgit Nowling K. Nash Shearer, LPC-A, [facility]  Counselor 09/14/2016 11:49 PM

## 2016-09-14 NOTE — ED Provider Notes (Signed)
[facility] DEPT Provider Note   CSN: [id number] Arrival date & time: 09/14/16  1944     History   Chief Complaint Chief Complaint  Patient presents with  . Psychiatric Evaluation  . Homicidal  . Delusional    HPI Justin Chaney is a 21 y.o. male with a hx of Bipolar disorder, depression, schizophrenia, VP shunt, asthma presents to the Emergency Department complaining of gradual, persistent, progressively worsening homicidal ideations persistent over the last several weeks. 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  He reports that daily he drinks blood. He reports he believes in child and human sacrifice. He also states he has the antichrist. He denies suicidal ideation. He does not have a specific plan to harm anyone in particular but reports that anyone can be a victim. He reports smoking cigarettes but no alcohol or drug usage. He also endorses associated auditory and visual hallucinations. He reports he is here tonight because his mother is worried about him. He reports he has previously been hospitalized for psychiatric problems. He has not taken medications in greater than one year.  The history is provided by the patient and medical records. No language interpreter was used.    Past Medical History:  Diagnosis Date  . Asthma   . Bipolar disorder ([place])   . 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 Depression   . Homelessness   . Schizophrenia ([place])   . Seizures [facility])     Patient Active Problem List   Diagnosis Date Noted  . Suicidal ideations   . Hallucinations   . Homicidal ideation   . Suicidal thoughts   . Cocaine use disorder, severe, dependence ([place]) 11/07/2015  . Amphetamine use disorder, severe, dependence ([place]) 11/07/2015  . Cannabis use disorder, moderate, in sustained remission ([place]) 11/07/2015  . Intermittent explosive disorder 09/05/2015  . Schizoaffective disorder, bipolar type ([place])  09/04/2015  . Non compliance w medication regimen 01/17/2015    Past Surgical History:  Procedure Laterality Date  . TONSILLECTOMY    . VENTRICULO-PERITONEAL SHUNT PLACEMENT / LAPAROSCOPIC INSERTION PERITONEAL CATHETER         Home Medications    Prior to Admission medications   Medication Sig Start Date End Date Taking? Authorizing Provider  carbamazepine (TEGRETOL) 200 MG tablet Take 1 tablet (200 mg total) by mouth 2 (two) times daily after a meal. 09/11/16   Benjamine Mola, FNP  OLANZapine zydis (ZYPREXA) 10 MG disintegrating tablet Take 1 tablet (10 mg total) by mouth at bedtime. 09/11/16   Benjamine Mola, FNP    Family History Family History  Problem Relation Age of Onset  . Hypertension Mother   . Mental illness Neg Hx     Social History Social History  Substance Use Topics  . Smoking status: Former Smoker    Packs/day: 0.50    Types: Cigarettes    Quit date: 10/22/2015  . Smokeless tobacco: Never Used  . Alcohol use Yes     Allergies   Amoxicillin and Penicillins   Review of Systems Review of Systems  Psychiatric/Behavioral: Positive for behavioral problems and hallucinations.  All other systems reviewed and are negative.    Physical Exam Updated Vital Signs BP 144/65 (BP Location: Left Arm)   Pulse 98   Temp 98.4 F (36.9 C) (Oral)   Resp 22   SpO2 98%   Physical Exam  Constitutional: He appears well-developed and well-nourished. No distress.  HENT:  Head: Normocephalic.  Eyes: Conjunctivae are normal. No scleral icterus.  Neck: Normal range of motion.  Cardiovascular: Normal rate, regular rhythm, normal heart sounds and intact distal pulses.   Pulmonary/Chest: Effort normal and breath sounds normal.  Abdominal: Soft. Bowel sounds are normal. He exhibits no distension.  Musculoskeletal: Normal range of motion. He exhibits no edema.  Neurological: He is alert.  Skin: Skin is warm and dry.  Psychiatric: His speech is normal and behavior is  normal. He is actively hallucinating. He expresses inappropriate judgment. He expresses homicidal ideation. He expresses no suicidal ideation. He expresses no suicidal plans and no homicidal plans.  Flat affect     ED Treatments / Results  Labs (all labs ordered are listed, but only abnormal results are displayed) Labs Reviewed  COMPREHENSIVE METABOLIC PANEL - Abnormal; Notable for the following:       Result Value   Glucose, Bld 110 (*)    Alkaline Phosphatase 37 (*)    All other components within normal limits  ACETAMINOPHEN LEVEL - Abnormal; Notable for the following:    Acetaminophen (Tylenol), Serum <10 (*)    All other components within normal limits  ETHANOL  SALICYLATE LEVEL  CBC  RAPID URINE DRUG SCREEN, HOSP PERFORMED     Procedures Procedures (including critical care time)  Medications Ordered in ED Medications  nicotine (NICODERM CQ - dosed in mg/24 hours) patch 21 mg (not administered)  ondansetron (ZOFRAN) tablet 4 mg (not administered)  alum & mag hydroxide-simeth (MAALOX/MYLANTA) 200-200-20 MG/5ML suspension 30 mL (not administered)  LORazepam (ATIVAN) tablet 1 mg (not administered)  ibuprofen (ADVIL,MOTRIN) tablet 600 mg (not administered)  acetaminophen (TYLENOL) tablet 650 mg (not administered)     Initial Impression / Assessment and Plan / ED Course  I have reviewed the triage vital signs and the nursing notes.  Pertinent labs & imaging results that were available during my care of the patient were reviewed by me and considered in my medical decision making (see chart for details).     Patient possessed emergency department with homicidal ideation and psychosis. He is currently not on any medications. No somatic complaints. Lab work is reassuring. Patient is medically cleared. It will need TTS consult.  Patient is currently here voluntarily. He attempts to leave he will need IVC paperwork as he is a danger to others.  Final Clinical Impressions(s)  / ED Diagnoses   Final diagnoses:  Psychosis, unspecified psychosis type  Homicidal ideation    New Prescriptions New Prescriptions   No medications on file     Abigail Butts, PA-C 09/14/16 2333    Carmin Muskrat, MD 09/16/16 1215

## 2016-09-15 DIAGNOSIS — F1590 Other stimulant use, unspecified, uncomplicated: Secondary | ICD-10-CM

## 2016-09-15 DIAGNOSIS — Z880 Allergy status to penicillin: Secondary | ICD-10-CM | POA: Diagnosis not present

## 2016-09-15 DIAGNOSIS — Z87891 Personal history of nicotine dependence: Secondary | ICD-10-CM

## 2016-09-15 DIAGNOSIS — R45850 Homicidal ideations: Secondary | ICD-10-CM | POA: Diagnosis not present

## 2016-09-15 DIAGNOSIS — F1290 Cannabis use, unspecified, uncomplicated: Secondary | ICD-10-CM

## 2016-09-15 DIAGNOSIS — Z888 Allergy status to other drugs, medicaments and biological substances status: Secondary | ICD-10-CM | POA: Diagnosis not present

## 2016-09-15 DIAGNOSIS — R443 Hallucinations, unspecified: Secondary | ICD-10-CM | POA: Diagnosis not present

## 2016-09-15 DIAGNOSIS — R45851 Suicidal ideations: Secondary | ICD-10-CM | POA: Diagnosis not present

## 2016-09-15 NOTE — ED Notes (Signed)
Breakfast tray ordered for tomorrow morning.

## 2016-09-15 NOTE — Progress Notes (Addendum)
09/15/16  CSW faxed IVC paperwork to [facility] @2010 , per request of Dr. Jake Samples accepting physician. Call report to [phone] ask for Santiago Glad.  CSW followed up with Levada Dy [facility] Admissions, [phone] to coordinate pt's transfer.  Pt. Can be transferred tonight.  Areatha Keas. Judi Cong, MSW, [place] Work Disposition [phone]  [facility] notified.

## 2016-09-15 NOTE — Consult Note (Signed)
Telepsych Consultation   Reason for Consult:  Homicidal ideations Referring Physician:  EDP Patient Identification: Justin Chaney MRN:  [id number] Principal Diagnosis: Homicidal ideation  Diagnosis:   Patient Active Problem List   Diagnosis Date Noted  . Suicidal ideations [R45.851]   . Hallucinations [R44.3]   . Homicidal ideation [R45.850]   . Suicidal thoughts [R45.851]   . Cocaine use disorder, severe, dependence ([place]) [F14.20] 11/07/2015  . Amphetamine use disorder, severe, dependence ([place]) [F15.20] 11/07/2015  . Cannabis use disorder, moderate, in sustained remission ([place]) [F12.21] 11/07/2015  . Intermittent explosive disorder [F63.81] 09/05/2015  . Schizoaffective disorder, bipolar type ([place]) [F25.0] 09/04/2015  . Non compliance w medication regimen [Z91.14] 01/17/2015    Total Time spent with patient: 30 minutes  Subjective:   Justin Chaney is a 21 y.o. male patient admitted with increased homicidal ideations over the past few weeks.  HPI:  Per tele assessment note on chart written by Darlen Round, [facility] Counselor: Justin Chaney is an 21 y.o. male, African American, Single who presents to [facility] ED per ED report: hx of Bipolar disorder, depression, schizophrenia, VP shunt, asthmapresents to the Emergency Department complaining of gradual, persistent, progressively worsening homicidal ideations persistent over the last several weeks. Patient reports that he is a satanistas the current incarnation of [place]. He reports that daily he drinks blood. He reports he believes in child and human sacrifice. He also states he has the antichrist. He denies suicidal ideation. He does not have a specific plan to harm anyone in particular but reports that anyone can be a victim. He reports smoking cigarettes but no alcohol or drug usage. He also endorses associated auditory and visual hallucinations. He reports he is here tonight because his mother is worried about him. He  reports he has previously been hospitalized for psychiatric problems. He has not taken medications in greater than one year.  Patient states no real primary concern, but does acknowledge SI with thoughts and AVH regarding satanic rituals. Patient states same as other hospitalizations and acknowledges himself as the antichrist and endorses human sacrificesAnd drinking blood. Patient states that he resides in [place] with a friend at present. Patient denies current SI or plan. Patient acknowledges current HI with no victim name mentioned. Patient acknowledges current AVH, but states that he believes what he sees and hears are real with demonic nature. Patient denies S.A. Patient acknowledges history or multiple inpatient psych admissions at multiple locations in past years.  Patient states he is not seen outpatient for psychiatric care. Patient is dressed in scrubs and is alert and oriented x4. Patient speech was within normal limits and motor behavior appeared normal. Patient thought process is coherent. Patient does/ does not appear to be responding to internal stimuli. Patient was cooperative throughout the assessment and states that he/ she is agreeable to inpatient psychiatric treatment.   Diagnosis: Schizophrenia  Today during tele psych consult:  Pt was seen and chart reviewed.  Pt denies suicidal ideation, endorses homicidal ideation,endorses  auditory/visual hallucinations and does appear to be responding to internal stimuli. Pt was calm and cooperative, alert & oriented x 3, dressed in paper scrubs and sitting on the hospital bed. Pt stated he is satanist and is [name] reincarnated. Pt states "I drink blood and eat flesh, I believe in sacrificing men, women and children. I see things that others don't and I hear voices all the time. The voices are demons that tell me to sacrifice children. Satan came to me and showed me about  the world and what it really is and told me the story about God. I  have been like this since I was [age]. That is when Edmonia Lynch came to me." Pt stated it has been months since he has taken any antipsychotics and UDS was negative for any substances. Pt stated he has been hospitalized numerous times in psychiatric facilities because the homicidal thoughts become too much.   Past Psychiatric History: Schizoaffective disorder, Cocaine use disorder, Intermittent Explosive Disorder, Homicidal and suicidal ideations  Risk to Self: Suicidal Ideation: No Suicidal Intent: No Is patient at risk for suicide?: No Suicidal Plan?: No Access to Means: No What has been your use of drugs/alcohol within the last 12 months?: denies How many times?: 0 Other Self Harm Risks: denies Triggers for Past Attempts: None known Intentional Self Injurious Behavior: None Risk to Others: Homicidal Ideation: Yes-Currently Present Thoughts of Harm to Others: Yes-Currently Present Comment - Thoughts of Harm to Others: human sacrifices/ drink blood Current Homicidal Intent: Yes-Currently Present Current Homicidal Plan: Yes-Currently Present Describe Current Homicidal Plan: kill everyone Access to Homicidal Means: No Identified Victim: none noted History of harm to others?: No Assessment of Violence: In past 6-12 months Violent Behavior Description: n/a Does patient have access to weapons?: No Criminal Charges Pending?: No Does patient have a court date: No Prior Inpatient Therapy: Prior Inpatient Therapy: Yes Prior Therapy Dates: multiple Prior Therapy Facilty/Provider(s): several facilities Reason for Treatment: psychosis/ Schizophrenia Prior Outpatient Therapy: Prior Outpatient Therapy: No Prior Therapy Dates: n/a Prior Therapy Facilty/Provider(s): n/a Reason for Treatment: n/a Does patient have an ACCT team?: No Does patient have Intensive In-House Services?  : No Does patient have [facility] services? : No Does patient have P4CC services?: No  Past Medical History:  Past Medical  History:  Diagnosis Date  . Asthma   . Bipolar disorder ([place])   . 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 Depression   . Homelessness   . Schizophrenia ([place])   . Seizures ([place])     Past Surgical History:  Procedure Laterality Date  . TONSILLECTOMY    . VENTRICULO-PERITONEAL SHUNT PLACEMENT / LAPAROSCOPIC INSERTION PERITONEAL CATHETER     Family History:  Family History  Problem Relation Age of Onset  . Hypertension Mother   . Mental illness Neg Hx    Family Psychiatric  History: Unknown Social History:  History  Alcohol Use  . Yes     History  Drug Use  . Types: Marijuana, Other-see comments, Amphetamines    Comment: Adderall, "I don't do weed, only rarely"    Social History   Social History  . Marital status: Single    Spouse name: N/A  . Number of children: N/A  . Years of education: N/A   Social History Main Topics  . Smoking status: Former Smoker    Packs/day: 0.50    Types: Cigarettes    Quit date: 10/22/2015  . Smokeless tobacco: Never Used  . Alcohol use Yes  . Drug use: Yes    Types: Marijuana, Other-see comments, Amphetamines     Comment: Adderall, "I don't do weed, only rarely"  . Sexual activity: Not Asked   Other Topics Concern  . None   Social History Narrative  . None   Additional Social History:    Allergies:   Allergies  Allergen Reactions  . Amoxicillin Hives and Other (See Comments)    Has patient had a PCN reaction causing immediate rash, facial/tongue/throat swelling, SOB or lightheadedness  with hypotension: No Has patient had a PCN reaction causing severe rash involving mucus membranes or skin necrosis: No Has patient had a PCN reaction that required hospitalization No Has patient had a PCN reaction occurring within the last 10 years: No If all of the above answers are "NO", then may proceed with Cephalosporin use.  Marland Kitchen Penicillins Hives and Other (See Comments)    Has patient had a PCN reaction causing  immediate rash, facial/tongue/throat swelling, SOB or lightheadedness with hypotension: No Has patient had a PCN reaction causing severe rash involving mucus membranes or skin necrosis: No Has patient had a PCN reaction that required hospitalization No Has patient had a PCN reaction occurring within the last 10 years: No If all of the above answers are "NO", then may proceed with Cephalosporin use.    Labs:  Results for orders placed or performed during the hospital encounter of 09/14/16 (from the past 48 hour(s))  Comprehensive metabolic panel     Status: Abnormal   Collection Time: 09/14/16  8:56 PM  Result Value Ref Range   Sodium 141 135 - 145 mmol/L   Potassium 4.0 3.5 - 5.1 mmol/L   Chloride 104 101 - 111 mmol/L   CO2 26 22 - 32 mmol/L   Glucose, Bld 110 (H) 65 - 99 mg/dL   BUN 8 6 - 20 mg/dL   Creatinine, Ser 0.77 0.61 - 1.24 mg/dL   Calcium 9.5 8.9 - 10.3 mg/dL   Total Protein 7.1 6.5 - 8.1 g/dL   Albumin 3.8 3.5 - 5.0 g/dL   AST 28 15 - 41 U/L   ALT 23 17 - 63 U/L   Alkaline Phosphatase 37 (L) 38 - 126 U/L   Total Bilirubin 0.4 0.3 - 1.2 mg/dL   GFR calc non Af Amer >60 >60 mL/min   GFR calc Af Amer >60 >60 mL/min    Comment: (NOTE) The eGFR has been calculated using the CKD EPI equation. This calculation has not been validated in all clinical situations. eGFR's persistently <60 mL/min signify possible Chronic Kidney Disease.    Anion gap 11 5 - 15  Ethanol     Status: None   Collection Time: 09/14/16  8:56 PM  Result Value Ref Range   Alcohol, Ethyl (B) <5 <5 mg/dL    Comment:        LOWEST DETECTABLE LIMIT FOR SERUM ALCOHOL IS 5 mg/dL FOR MEDICAL PURPOSES ONLY   Salicylate level     Status: None   Collection Time: 09/14/16  8:56 PM  Result Value Ref Range   Salicylate Lvl <8.2 2.8 - 30.0 mg/dL  Acetaminophen level     Status: Abnormal   Collection Time: 09/14/16  8:56 PM  Result Value Ref Range   Acetaminophen (Tylenol), Serum <10 (L) 10 - 30 ug/mL     Comment:        THERAPEUTIC CONCENTRATIONS VARY SIGNIFICANTLY. A RANGE OF 10-30 ug/mL MAY BE AN EFFECTIVE CONCENTRATION FOR MANY PATIENTS. HOWEVER, SOME ARE BEST TREATED AT CONCENTRATIONS OUTSIDE THIS RANGE. ACETAMINOPHEN CONCENTRATIONS >150 ug/mL AT 4 HOURS AFTER INGESTION AND >50 ug/mL AT 12 HOURS AFTER INGESTION ARE OFTEN ASSOCIATED WITH TOXIC REACTIONS.   cbc     Status: None   Collection Time: 09/14/16  8:56 PM  Result Value Ref Range   WBC 7.3 4.0 - 10.5 K/uL   RBC 5.39 4.22 - 5.81 MIL/uL   Hemoglobin 14.9 13.0 - 17.0 g/dL   HCT 44.6 39.0 - 52.0 %  MCV 82.7 78.0 - 100.0 fL   MCH 27.6 26.0 - 34.0 pg   MCHC 33.4 30.0 - 36.0 g/dL   RDW 12.5 11.5 - 15.5 %   Platelets 248 150 - 400 K/uL  Rapid urine drug screen (hospital performed)     Status: None   Collection Time: 09/14/16  9:24 PM  Result Value Ref Range   Opiates NONE DETECTED NONE DETECTED   Cocaine NONE DETECTED NONE DETECTED   Benzodiazepines NONE DETECTED NONE DETECTED   Amphetamines NONE DETECTED NONE DETECTED   Tetrahydrocannabinol NONE DETECTED NONE DETECTED   Barbiturates NONE DETECTED NONE DETECTED    Comment:        DRUG SCREEN FOR MEDICAL PURPOSES ONLY.  IF CONFIRMATION IS NEEDED FOR ANY PURPOSE, NOTIFY LAB WITHIN 5 DAYS.        LOWEST DETECTABLE LIMITS FOR URINE DRUG SCREEN Drug Class       Cutoff (ng/mL) Amphetamine      1000 Barbiturate      200 Benzodiazepine   443 Tricyclics       154 Opiates          300 Cocaine          300 THC              50     Current Facility-Administered Medications  Medication Dose Route Frequency Provider Last Rate Last Dose  . acetaminophen (TYLENOL) tablet 650 mg  650 mg Oral Q4H PRN Hannah Muthersbaugh, PA-C      . alum & mag hydroxide-simeth (MAALOX/MYLANTA) 200-200-20 MG/5ML suspension 30 mL  30 mL Oral PRN Hannah Muthersbaugh, PA-C      . ibuprofen (ADVIL,MOTRIN) tablet 600 mg  600 mg Oral Q8H PRN Hannah Muthersbaugh, PA-C      . LORazepam (ATIVAN)  tablet 1 mg  1 mg Oral Q8H PRN Hannah Muthersbaugh, PA-C      . nicotine (NICODERM CQ - dosed in mg/24 hours) patch 21 mg  21 mg Transdermal Daily Hannah Muthersbaugh, PA-C      . ondansetron (ZOFRAN) tablet 4 mg  4 mg Oral Q8H PRN Abigail Butts, PA-C       Current Outpatient Prescriptions  Medication Sig Dispense Refill  . carbamazepine (TEGRETOL) 200 MG tablet Take 1 tablet (200 mg total) by mouth 2 (two) times daily after a meal. 60 tablet 0  . OLANZapine zydis (ZYPREXA) 10 MG disintegrating tablet Take 1 tablet (10 mg total) by mouth at bedtime. 30 tablet 0    Musculoskeletal: Unable to assess: camera  Psychiatric Specialty Exam: Physical Exam  Review of Systems  Psychiatric/Behavioral: Positive for depression and hallucinations (auditory and visual). Negative for memory loss, substance abuse and suicidal ideas. The patient is not nervous/anxious and does not have insomnia.     Blood pressure (!) 113/47, pulse 82, temperature 98.2 F (36.8 C), temperature source Oral, resp. rate 20, SpO2 96 %.There is no height or weight on file to calculate BMI.  General Appearance: Casual and Fairly Groomed  Eye Contact:  Fair  Speech:  Clear and Coherent and Pressured  Volume:  Normal  Mood:  Anxious and Depressed  Affect:  Congruent, Depressed and Restricted  Thought Process:  Disorganized  Orientation:  Full (Time, Place, and Person)  Thought Content:  Hallucinations: Auditory Command:  to scarifice people Visual, Ideas of Reference:   Delusions and Tangential  Suicidal Thoughts:  No  Homicidal Thoughts:  Yes.  with intent/plan  Memory:  Immediate;   Good Recent;  Fair Remote;   Fair  Judgement:  Fair  Insight:  Fair  Psychomotor Activity:  Normal  Concentration:  Concentration: Fair and Attention Span: Good  Recall:  Good  Fund of Knowledge:  Good  Language:  Good  Akathisia:  No  Handed:  Right  AIMS (if indicated):     Assets:  Communication Skills Desire for  Improvement Financial Resources/Insurance Housing Leisure Time Physical Health Resilience Social Support  ADL's:  Intact  Cognition:  WNL  Sleep:        Treatment Plan Summary: Daily contact with patient to assess and evaluate symptoms and progress in treatment and Medication management  Disposition: Recommend psychiatric Inpatient admission when medically cleared.  TTS to seek placement  Ethelene Hal, NP 09/15/2016 10:21 AM

## 2016-09-15 NOTE — ED Notes (Signed)
Dinner tray delivered.

## 2016-09-15 NOTE — Progress Notes (Signed)
Referral submitted to: [address], [place], [facility], [place], [place], [place], [place], [place], [place], [place], [place], [place], [place], [place], [place], [place], [place]. Nash Shearer, LPC-A, [facility]  Counselor 09/15/2016 6:29 AM

## 2016-09-15 NOTE — ED Notes (Signed)
Pt given snack. 

## 2016-09-15 NOTE — ED Notes (Signed)
IVC paperwork given to EDP to have pt IVC'd for transport to [facility].

## 2016-09-15 NOTE — Progress Notes (Signed)
Received call from [place], intake at [facility]. States admitting MD wants to offer acceptance to behavioral health unit if pt can be under IVC prior to transfer due to "his reporting drinking blood, homicidal/ suicidal ideation, thoughts of human sacrifice. Feel too dangerouns admitting him voluntarily due to safety risk."   Notified EDP Eulis Foster of request for IVC for admission;  left voicemail with Gerald Stabs and will update [place] with outcome.   Sharren Bridge, MSW, LCSW Clinical Social Work, Disposition  09/15/2016 [phone]

## 2016-09-15 NOTE — ED Notes (Signed)
IVC paperwork faxed to [facility] and copy in medical record drawer.

## 2016-09-15 NOTE — ED Notes (Signed)
TTS done 

## 2016-09-15 NOTE — ED Notes (Signed)
Pt up to use phone.

## 2016-09-15 NOTE — Progress Notes (Addendum)
Per Frederico Hamman, [place] meets inpatient criteria. Per Yolanda Manges RN [place] currently under review Roniyah Llorens K. Quenton Recendez, LCAS-A, LPC-A, [facility]  Counselor 09/15/2016 2:06 AM

## 2016-09-15 NOTE — ED Notes (Signed)
IVC paperwork has been faxed to [facility] [place] and they have said that it has been received.  Waiting on patient to be served.

## 2016-09-15 NOTE — ED Provider Notes (Signed)
I was asked to evaluate this patient, for the possibility of involuntary commitment. He has been seen by TTS.  At this time the patient is alert, cooperative and comfortable. He does not appear to be responding to internal stimuli. The patient states that he has schizophrenia and multiple admissions for that in the past. He feels different today, and states that he is seeking help for psychosis, she is upset several of his friends. He states that he is back in line to take over for Satan, and that he drinks, blood daily, in a satanic ritual. He states that he has thoughts of killing various people, but will not specify who. He states that he wants to torture people and has done that in the past. He denies prior homicide. He states that he has heard voices in the past, but is not hearing them now.  At this time the patient is delusional, indicating acute psychosis. He has thoughts of homicide, but is guarded about that might be about. He is at risk to others, and possibly to himself. Therefore I filled out involuntary commitment papers on the patient, to hold him until he can be placed.   Daleen Bo, MD 09/15/16 [phone]

## 2016-09-15 NOTE — BH Assessment (Signed)
Tele Assessment Note   Justin Chaney is an 21 y.o. male, African American, Single who presents to [facility] per ED report: hx of Bipolar disorder, depression, schizophrenia, VP shunt, asthma presents to the Emergency Department complaining of gradual, persistent, progressively worsening homicidal ideations persistent over the last several weeks. 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  He reports that daily he drinks blood. He reports he believes in child and human sacrifice. He also states he has the antichrist. He denies suicidal ideation. He does not have a specific plan to harm anyone in particular but reports that anyone can be a victim. He reports smoking cigarettes but no alcohol or drug usage. He also endorses associated auditory and visual hallucinations. He reports he is here tonight because his mother is worried about him. He reports he has previously been hospitalized for psychiatric problems. He has not taken medications in greater than one year.  Patient states no real primary concern, but does acknowledge SI with thoughts and AVH regarding satanic rituals. Patient states same as other hospitalizations and acknowledges himself as the antichrist and endorses human sacrificesAnd drinking blood. Patient states that he resides in [place] with a friend at present. Patient denies current SI or plan. Patient acknowledges current HI with no victim name mentioned. Patient acknowledges current AVH, but states that he believes what he sees and hears are real with demonic nature. Patient denies S.A. Patient acknowledges history or multiple inpatient psych admissions at multiple locations in past years.  Patient states he is not seen outpatient for psychiatric care. Patient is dressed in scrubs and is alert and oriented x4. Patient speech was within normal limits and motor behavior appeared normal. Patient thought process is coherent. Patient does/ does  not appear to be responding to internal stimuli. Patient was cooperative throughout the assessment and states that he/ she is agreeable to inpatient psychiatric treatment.   Diagnosis: Schizophrenia  Past Medical History:  Past Medical History:  Diagnosis Date  . Asthma   . Bipolar disorder ([place])   . 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 Depression   . Homelessness   . Schizophrenia ([name])   . Seizures ([place])     Past Surgical History:  Procedure Laterality Date  . TONSILLECTOMY    . VENTRICULO-PERITONEAL SHUNT PLACEMENT / LAPAROSCOPIC INSERTION PERITONEAL CATHETER      Family History:  Family History  Problem Relation Age of Onset  . Hypertension Mother   . Mental illness Neg Hx     Social History:  reports that he quit smoking about 10 months ago. His smoking use included Cigarettes. He smoked 0.50 packs per day. He has never used smokeless tobacco. He reports that he drinks alcohol. He reports that he uses drugs, including Marijuana, Other-see comments, and Amphetamines.  Additional Social History:  Alcohol / Drug Use Pain Medications: SEE MAR Prescriptions: SEE MAR Over the Counter: SEE MAR History of alcohol / drug use?: No history of alcohol / drug abuse  CIWA: CIWA-Ar BP: 144/65 Pulse Rate: 98 COWS:    PATIENT STRENGTHS: (choose at least two) Active sense of humor Communication skills Financial means  Allergies:  Allergies  Allergen Reactions  . Amoxicillin Hives and Other (See Comments)    Has patient had a PCN reaction causing immediate rash, facial/tongue/throat swelling, SOB or lightheadedness with hypotension: No Has patient had a PCN reaction causing severe rash involving mucus membranes or skin necrosis: No Has  patient had a PCN reaction that required hospitalization No Has patient had a PCN reaction occurring within the last 10 years: No If all of the above answers are "NO", then may proceed with Cephalosporin use.  Marland Kitchen  Penicillins Hives and Other (See Comments)    Has patient had a PCN reaction causing immediate rash, facial/tongue/throat swelling, SOB or lightheadedness with hypotension: No Has patient had a PCN reaction causing severe rash involving mucus membranes or skin necrosis: No Has patient had a PCN reaction that required hospitalization No Has patient had a PCN reaction occurring within the last 10 years: No If all of the above answers are "NO", then may proceed with Cephalosporin use.    Home Medications:  (Not in a hospital admission)  OB/GYN Status:  No LMP for male patient.  General Assessment Data Location of Assessment: [facility] ED TTS Assessment: In system Is this a Tele or Face-to-Face Assessment?: Tele Assessment Is this an Initial Assessment or a Re-assessment for this encounter?: Initial Assessment Marital status: Single Maiden name: n/a Is patient pregnant?: No Pregnancy Status: No Living Arrangements: Other (Comment) (sattes lives with friend) Can pt return to current living arrangement?: Yes Admission Status: Voluntary Is patient capable of signing voluntary admission?: Yes Referral Source: Other Insurance type: BCBS     Crisis Care Plan Living Arrangements: Other (Comment) (sattes lives with friend) Name of Psychiatrist: none Name of Therapist: none  Education Status Is patient currently in school?: No Current Grade: n/a Highest grade of school patient has completed: unknown Name of school: na Contact person: na  Risk to self with the past 6 months Suicidal Ideation: No Has patient been a risk to self within the past 6 months prior to admission? : No Suicidal Intent: No Has patient had any suicidal intent within the past 6 months prior to admission? : No Is patient at risk for suicide?: No Suicidal Plan?: No Has patient had any suicidal plan within the past 6 months prior to admission? : No Access to Means: No What has been your use of drugs/alcohol within the  last 12 months?: denies Previous Attempts/Gestures: No How many times?: 0 Other Self Harm Risks: denies Triggers for Past Attempts: None known Intentional Self Injurious Behavior: None Family Suicide History: Unknown Recent stressful life event(s): Trauma (Comment) Persecutory voices/beliefs?: No Depression: No Substance abuse history and/or treatment for substance abuse?: No Suicide prevention information given to non-admitted patients: Not applicable  Risk to Others within the past 6 months Homicidal Ideation: Yes-Currently Present Does patient have any lifetime risk of violence toward others beyond the six months prior to admission? : Yes (comment) Thoughts of Harm to Others: Yes-Currently Present Comment - Thoughts of Harm to Others: human sacrifices/ drink blood Current Homicidal Intent: Yes-Currently Present Current Homicidal Plan: Yes-Currently Present Describe Current Homicidal Plan: kill everyone Access to Homicidal Means: No Identified Victim: none noted History of harm to others?: No Assessment of Violence: In past 6-12 months Violent Behavior Description: n/a Does patient have access to weapons?: No Criminal Charges Pending?: No Does patient have a court date: No Is patient on probation?: No  Psychosis Hallucinations: Auditory, Visual Delusions: None noted  Mental Status Report Appearance/Hygiene: In scrubs Eye Contact: Fair Motor Activity: Unremarkable Speech: Logical/coherent Level of Consciousness: Alert Mood: Euphoric, Preoccupied Affect: Preoccupied Anxiety Level: None Thought Processes: Irrelevant Judgement: Impaired Orientation: Person, Place, Time, Situation, Appropriate for developmental age, Not oriented Obsessive Compulsive Thoughts/Behaviors: None  Cognitive Functioning Concentration: Normal Memory: Recent Intact, Remote Intact IQ: Below  Average Level of Function: unknown Insight: Poor Impulse Control: Fair Appetite: Good Weight Loss:  0 Weight Gain: 0 Sleep: Decreased Total Hours of Sleep: 4 Vegetative Symptoms: None  ADLScreening [facility] Assessment Services) Patient's cognitive ability adequate to safely complete daily activities?: Yes Patient able to express need for assistance with ADLs?: Yes Independently performs ADLs?: Yes (appropriate for developmental age)  Prior Inpatient Therapy Prior Inpatient Therapy: Yes Prior Therapy Dates: multiple Prior Therapy Facilty/Provider(s): several facilities Reason for Treatment: psychosis/ Schizophrenia  Prior Outpatient Therapy Prior Outpatient Therapy: No Prior Therapy Dates: n/a Prior Therapy Facilty/Provider(s): n/a Reason for Treatment: n/a Does patient have an ACCT team?: No Does patient have Intensive In-House Services?  : No Does patient have [facility] services? : No Does patient have P4CC services?: No  ADL Screening (condition at time of admission) Patient's cognitive ability adequate to safely complete daily activities?: Yes Is the patient deaf or have difficulty hearing?: No Does the patient have difficulty seeing, even when wearing glasses/contacts?: No Does the patient have difficulty concentrating, remembering, or making decisions?: No Patient able to express need for assistance with ADLs?: Yes Does the patient have difficulty dressing or bathing?: No Independently performs ADLs?: Yes (appropriate for developmental age) Does the patient have difficulty walking or climbing stairs?: No Weakness of Legs: None Weakness of Arms/Hands: None       Abuse/Neglect Assessment (Assessment to be complete while patient is alone) Physical Abuse: Denies Verbal Abuse: Denies Sexual Abuse: Denies Exploitation of patient/patient's resources: Denies Self-Neglect: Denies Values / Beliefs Cultural Requests During Hospitalization: Other (comment) Animator) Spiritual Requests During Hospitalization: None   Advance Directives (For Healthcare) Does Patient Have a  Medical Advance Directive?: No    Additional Information 1:1 In Past 12 Months?: No CIRT Risk: No Elopement Risk: No Does patient have medical clearance?: No     Disposition: Per Frederico Hamman, PA meets inpatient criteria Disposition Initial Assessment Completed for this Encounter: Yes Disposition of Patient: Other dispositions (TBD)  Nimsi Males K Blen Ransome 09/15/2016 1:20 AM

## 2016-09-15 NOTE — ED Notes (Signed)
Lunch tray delivered.

## 2016-09-15 NOTE — ED Notes (Signed)
Dent received IVC papers and will be faxing them to [place] at [facility] will inform me when [facility] will be accepting patient.

## 2016-09-15 NOTE — ED Notes (Signed)
IVC paperwork faxed and accepted @ Magistrate's office

## 2016-09-15 NOTE — ED Notes (Signed)
Pt has been served by [facility] for IVC.

## 2016-09-15 NOTE — Progress Notes (Signed)
Still having problems with cart. RN is in process of getting other cart for TTS. Lanyiah Brix K. Nash Shearer, LPC-A, [facility]  Counselor 09/15/2016 12:33 AM

## 2016-09-16 DIAGNOSIS — R45850 Homicidal ideations: Secondary | ICD-10-CM | POA: Insufficient documentation

## 2016-09-16 DIAGNOSIS — F200 Paranoid schizophrenia: Secondary | ICD-10-CM | POA: Insufficient documentation

## 2016-10-02 ENCOUNTER — Encounter (HOSPITAL_COMMUNITY): Payer: Self-pay | Admitting: Emergency Medicine

## 2016-10-02 ENCOUNTER — Emergency Department (HOSPITAL_COMMUNITY)
Admission: EM | Admit: 2016-10-02 | Discharge: 2016-10-03 | Disposition: A | Discharge status: Other Acute Inpatient Hospital | Payer: Medicaid Other | Attending: Emergency Medicine | Admitting: Emergency Medicine

## 2016-10-02 DIAGNOSIS — Z87891 Personal history of nicotine dependence: Secondary | ICD-10-CM | POA: Diagnosis not present

## 2016-10-02 DIAGNOSIS — J45909 Unspecified asthma, uncomplicated: Secondary | ICD-10-CM | POA: Insufficient documentation

## 2016-10-02 DIAGNOSIS — R45851 Suicidal ideations: Secondary | ICD-10-CM | POA: Diagnosis present

## 2016-10-02 DIAGNOSIS — F1410 Cocaine abuse, uncomplicated: Secondary | ICD-10-CM | POA: Diagnosis not present

## 2016-10-02 DIAGNOSIS — Z79899 Other long term (current) drug therapy: Secondary | ICD-10-CM | POA: Diagnosis not present

## 2016-10-02 LAB — CBC
HCT: 42.1 % (ref 39.0–52.0)
Hemoglobin: 14.1 g/dL (ref 13.0–17.0)
MCH: 26.7 pg (ref 26.0–34.0)
MCHC: 33.5 g/dL (ref 30.0–36.0)
MCV: 79.6 fL (ref 78.0–100.0)
Platelets: 271 10*3/uL (ref 150–400)
RBC: 5.29 MIL/uL (ref 4.22–5.81)
RDW: 12.5 % (ref 11.5–15.5)
WBC: 10.1 10*3/uL (ref 4.0–10.5)

## 2016-10-02 LAB — RAPID URINE DRUG SCREEN, HOSP PERFORMED
Amphetamines: NOT DETECTED
Barbiturates: NOT DETECTED
Benzodiazepines: NOT DETECTED
Cocaine: POSITIVE — AB
Opiates: NOT DETECTED
Tetrahydrocannabinol: NOT DETECTED

## 2016-10-02 LAB — COMPREHENSIVE METABOLIC PANEL
ALT: 27 U/L (ref 17–63)
AST: 54 U/L — ABNORMAL HIGH (ref 15–41)
Albumin: 4.1 g/dL (ref 3.5–5.0)
Alkaline Phosphatase: 78 U/L (ref 38–126)
Anion gap: 8 (ref 5–15)
BUN: 12 mg/dL (ref 6–20)
CO2: 23 mmol/L (ref 22–32)
Calcium: 8.9 mg/dL (ref 8.9–10.3)
Chloride: 109 mmol/L (ref 101–111)
Creatinine, Ser: 1.12 mg/dL (ref 0.61–1.24)
GFR calc Af Amer: >60 mL/min (ref >60)
GFR calc non Af Amer: >60 mL/min (ref >60)
Glucose, Bld: 110 mg/dL — ABNORMAL HIGH (ref 65–99)
Potassium: 3.8 mmol/L (ref 3.5–5.1)
Sodium: 140 mmol/L (ref 135–145)
Total Bilirubin: 0.5 mg/dL (ref 0.3–1.2)
Total Protein: 6.8 g/dL (ref 6.5–8.1)

## 2016-10-02 LAB — ETHANOL: Alcohol, Ethyl (B): <5 mg/dL (ref <5)

## 2016-10-02 LAB — ACETAMINOPHEN LEVEL: Acetaminophen (Tylenol), Serum: <10 ug/mL — ABNORMAL LOW (ref 10–30)

## 2016-10-02 LAB — LITHIUM LEVEL: Lithium Lvl: <0.06 mmol/L — ABNORMAL LOW (ref 0.60–1.20)

## 2016-10-02 LAB — SALICYLATE LEVEL: Salicylate Lvl: <7 mg/dL (ref 2.8–30.0)

## 2016-10-02 NOTE — ED Notes (Signed)
Bed: WTR5 Expected date:  Expected time:  Means of arrival:  Comments: 

## 2016-10-02 NOTE — ED Notes (Signed)
Sandwich and drink provided to pt 

## 2016-10-02 NOTE — ED Triage Notes (Signed)
Pt had thoughts of killing himself earlier today; pt asked staff at the [facility] to call [employer] to help him get to the hospital;  Pt states he would use a pocket knife to kill himself; states he is schizophrenic and is hearing voices; pt says he doesn't want to talk about what the voices are hearing but understands he will have to talk about it with a counselor; pt denies other hallucinations and homicidal ideation; [facility] where he was evaluated for being a "satanist"; calm, cooperative, A&Ox4

## 2016-10-02 NOTE — ED Notes (Signed)
Bed: WA32 Expected date:  Expected time:  Means of arrival:  Comments: 

## 2016-10-02 NOTE — ED Provider Notes (Signed)
[facility] DEPT Provider Note   CSN: [id number] Arrival date & time: 10/02/16  2006     History   Chief Complaint Chief Complaint  Patient presents with  . Suicidal    HPI Justin Chaney is a 21 y.o. male.  Patient c/o being possessed by demons and devils, and states depression is worse in past week, and now is having thoughts of suicide. Denies plan. Denies thoughts of harming others. Denies any attempt to harm self. Is eating and drinking. Normal appetite. Denies other recent physical health problems. States compliant w his normal meds. Denies problems w etoh or substance abuse.      The history is provided by the patient.    Past Medical History:  Diagnosis Date  . Asthma   . Bipolar disorder ([place])   . 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 Depression   . Homelessness   . Schizophrenia ([place])   . Seizures [facility])     Patient Active Problem List   Diagnosis Date Noted  . Suicidal ideations   . Hallucinations   . Homicidal ideation   . Suicidal thoughts   . Cocaine use disorder, severe, dependence ([place]) 11/07/2015  . Amphetamine use disorder, severe, dependence ([place]) 11/07/2015  . Cannabis use disorder, moderate, in sustained remission ([place]) 11/07/2015  . Intermittent explosive disorder 09/05/2015  . Schizoaffective disorder, bipolar type ([place]) 09/04/2015  . Non compliance w medication regimen 01/17/2015    Past Surgical History:  Procedure Laterality Date  . TONSILLECTOMY    . VENTRICULO-PERITONEAL SHUNT PLACEMENT / LAPAROSCOPIC INSERTION PERITONEAL CATHETER         Home Medications    Prior to Admission medications   Medication Sig Start Date End Date Taking? Authorizing Provider  carbamazepine (TEGRETOL) 200 MG tablet Take 1 tablet (200 mg total) by mouth 2 (two) times daily after a meal. 09/11/16   Benjamine Mola, FNP  OLANZapine zydis (ZYPREXA) 10 MG disintegrating tablet Take 1 tablet (10 mg total) by mouth at bedtime.  09/11/16   Benjamine Mola, FNP    Family History Family History  Problem Relation Age of Onset  . Hypertension Mother   . Mental illness Neg Hx     Social History Social History  Substance Use Topics  . Smoking status: Former Smoker    Packs/day: 0.50    Types: Cigarettes    Quit date: 10/22/2015  . Smokeless tobacco: Never Used  . Alcohol use No     Allergies   Amoxicillin and Penicillins   Review of Systems Review of Systems  Constitutional: Negative for fever.  HENT: Negative for sore throat.   Eyes: Negative for redness.  Respiratory: Negative for shortness of breath.   Cardiovascular: Negative for chest pain.  Gastrointestinal: Negative for abdominal pain and vomiting.  Genitourinary: Negative for flank pain.  Musculoskeletal: Negative for back pain and neck pain.  Skin: Negative for rash.  Neurological: Negative for headaches.  Hematological: Does not bruise/bleed easily.  Psychiatric/Behavioral: Positive for dysphoric mood and suicidal ideas.     Physical Exam Updated Vital Signs BP 115/76 (BP Location: Right Arm)   Pulse 102   Temp 97.7 F (36.5 C) (Oral)   Resp 18   SpO2 95%   Physical Exam  Constitutional: He appears well-developed and well-nourished. No distress.  HENT:  Head: Atraumatic.  Eyes: Conjunctivae are normal. Pupils are equal, round, and reactive to light.  Neck: Neck supple. No tracheal deviation present.  Cardiovascular: Normal rate, regular  rhythm, normal heart sounds and intact distal pulses.   Pulmonary/Chest: Effort normal and breath sounds normal. No accessory muscle usage. No respiratory distress.  Abdominal: Soft. Bowel sounds are normal. He exhibits no distension. There is no tenderness.  Musculoskeletal: He exhibits no edema.  Neurological: He is alert.  Speech clear/fluent. Steady gait.   Skin: Skin is warm and dry. He is not diaphoretic.  Psychiatric:  States possessed by demons. +SI.  Nursing note and vitals  reviewed.    ED Treatments / Results  Labs (all labs ordered are listed, but only abnormal results are displayed) Results for orders placed or performed during the hospital encounter of 10/02/16  Ethanol  Result Value Ref Range   Alcohol, Ethyl (B) <5 <5 mg/dL  Salicylate level  Result Value Ref Range   Salicylate Lvl [id number] 2.8 - 30.0 mg/dL  Acetaminophen level  Result Value Ref Range   Acetaminophen (Tylenol), Serum <10 (L) 10 - 30 ug/mL  cbc  Result Value Ref Range   WBC 10.1 4.0 - 10.5 K/uL   RBC 5.29 4.22 - 5.81 MIL/uL   Hemoglobin 14.1 13.0 - 17.0 g/dL   HCT 42.1 39.0 - 52.0 %   MCV 79.6 78.0 - 100.0 fL   MCH 26.7 26.0 - 34.0 pg   MCHC 33.5 30.0 - 36.0 g/dL   RDW 12.5 11.5 - 15.5 %   Platelets 271 150 - 400 K/uL  Rapid urine drug screen (hospital performed)  Result Value Ref Range   Opiates NONE DETECTED NONE DETECTED   Cocaine POSITIVE (A) NONE DETECTED   Benzodiazepines NONE DETECTED NONE DETECTED   Amphetamines NONE DETECTED NONE DETECTED   Tetrahydrocannabinol NONE DETECTED NONE DETECTED   Barbiturates NONE DETECTED NONE DETECTED    EKG  EKG Interpretation None       Radiology No results found.  Procedures Procedures (including critical care time)  Medications Ordered in ED Medications - No data to display   Initial Impression / Assessment and Plan / ED Course  I have reviewed the triage vital signs and the nursing notes.  Pertinent labs & imaging results that were available during my care of the patient were reviewed by me and considered in my medical decision making (see chart for details).  Labs sent.  [place] team consulted.  Dispo per [facility] team.     Final Clinical Impressions(s) / ED Diagnoses   Final diagnoses:  None    New Prescriptions New Prescriptions   No medications on file     Lajean Saver, MD 10/02/16 2210

## 2016-10-02 NOTE — ED Notes (Signed)
Pt placed in maroon scrubs and wanded by security. 

## 2016-10-02 NOTE — BH Assessment (Signed)
Tele Assessment Note   Justin Chaney is an 21 y.o. male who presents to the ED voluntarily. Pt reports he went to the [facility] today after he attempted suicide with his pocket knife. Pt stated "I told them I tried to kill myself and the people at the hotel told the cops I had a gun so like 10 police officers came out there with assault rifles." Pt stated he was d/c from [facility] on 09/30/16 after being on the [facility] wait list for 14 days. Pt stated "I just need help." Pt reports he is a "satanist" and he believes in "sacrificing children and women."    Pt reports he has attempted suicide multiple times in the past due to [place]. Pt reports he has been hearing voices for several years and the voices have been getting worse over the last several weeks. Pt was asked to identify his hx of harm towards others and he smiled at the assessor and stated "I don't want to go there." Pt reports he has attempted to harm others in the past but chose not to disclose his past homicidal ideations. Pt reports he does not have any current HI towards others. Pt has multiple inpt admissions due to Schizophrenia and hallucinations.   Per [name], NP pt meets criteria for inpt treatment. Case discussed with Lajean Saver, MD who is in agreement with disposition. TTS to seek placement.   Diagnosis: Schizophrenia; Cocaine Use D/O; Unspecified Delusional D/O   Past Medical History:  Past Medical History:  Diagnosis Date   Asthma    Bipolar disorder ([place])    Brain ventricular shunt obstruction (HCC)    hydrochelpis   Depression    Homelessness    Schizophrenia ([place])    Seizures ([place])     Past Surgical History:  Procedure Laterality Date   TONSILLECTOMY     VENTRICULO-PERITONEAL SHUNT PLACEMENT / LAPAROSCOPIC INSERTION PERITONEAL CATHETER      Family History:  Family History  Problem Relation Age of Onset   Hypertension Mother    Mental illness Neg Hx     Social History:  reports that he quit  smoking about a year ago. His smoking use included Cigarettes. He smoked 0.50 packs per day. He has never used smokeless tobacco. He reports that he uses drugs, including Marijuana, Other-see comments, and Amphetamines. He reports that he does not drink alcohol.  Additional Social History:  Alcohol / Drug Use Pain Medications: See PTA meds  Prescriptions: See PTA meds  Over the Counter: See PTA meds  History of alcohol / drug use?: Yes Substance #1 Name of Substance 1: Meth 1 - Age of First Use: 16 1 - Amount (size/oz): "a lot" 1 - Frequency: daily 1 - Duration: 1 year 1 - Last Use / Amount: 2016 Substance #2 Name of Substance 2: Cocaine 2 - Age of First Use: 19 2 - Amount (size/oz): "a couple bumps" 2 - Frequency: "every so often" 2 - Duration: ongoing 2 - Last Use / Amount: 10/01/16 Substance #3 Name of Substance 3: Molly 3 - Age of First Use: 19 3 - Amount (size/oz): $10 worth 3 - Frequency: not often 3 - Duration: less than 1 year 3 - Last Use / Amount: 2 months ago Substance #4 Name of Substance 4: Marijuana 4 - Age of First Use: "I was in high school" 4 - Amount (size/oz): 1 joint 4 - Frequency: occasional 4 - Duration: years 4 - Last Use / Amount: 10/01/16  CIWA: CIWA-Ar BP:  115/76 Pulse Rate: 102 COWS:    PATIENT STRENGTHS: (choose at least two) Capable of independent living Occupational psychologist fund of knowledge Motivation for treatment/growth  Allergies:  Allergies  Allergen Reactions   Amoxicillin Hives and Other (See Comments)    Has patient had a PCN reaction causing immediate rash, facial/tongue/throat swelling, SOB or lightheadedness with hypotension: No Has patient had a PCN reaction causing severe rash involving mucus membranes or skin necrosis: No Has patient had a PCN reaction that required hospitalization No Has patient had a PCN reaction occurring within the last 10 years: No If all of the above answers are "NO",  then may proceed with Cephalosporin use.   Penicillins Hives and Other (See Comments)    Has patient had a PCN reaction causing immediate rash, facial/tongue/throat swelling, SOB or lightheadedness with hypotension: No Has patient had a PCN reaction causing severe rash involving mucus membranes or skin necrosis: No Has patient had a PCN reaction that required hospitalization No Has patient had a PCN reaction occurring within the last 10 years: No If all of the above answers are "NO", then may proceed with Cephalosporin use.    Home Medications:  (Not in a hospital admission)  OB/GYN Status:  No LMP for male patient.  General Assessment Data Location of Assessment: [facility] TTS Assessment: In system Is this a Tele or Face-to-Face Assessment?: Face-to-Face Is this an Initial Assessment or a Re-assessment for this encounter?: Initial Assessment Marital status: Single Is patient pregnant?: No Pregnancy Status: No Living Arrangements: Parent, Other relatives Can pt return to current living arrangement?: Yes Admission Status: Voluntary Is patient capable of signing voluntary admission?: Yes Referral Source: Self/Family/Friend Insurance type: BCBS     Crisis Care Plan Living Arrangements: Parent, Other relatives Name of Psychiatrist: none Name of Therapist: none  Education Status Is patient currently in school?: No Highest grade of school patient has completed: 9th  Risk to self with the past 6 months Suicidal Ideation: Yes-Currently Present Has patient been a risk to self within the past 6 months prior to admission? : Yes Suicidal Intent: Yes-Currently Present Has patient had any suicidal intent within the past 6 months prior to admission? : Yes Is patient at risk for suicide?: Yes Suicidal Plan?: Yes-Currently Present Has patient had any suicidal plan within the past 6 months prior to admission? : Yes Specify Current Suicidal Plan: pt has a plan to kill himself using his  pocket knife Access to Means: Yes Specify Access to Suicidal Means: pt has access to a pocket knife What has been your use of drugs/alcohol within the last 12 months?: reports to using cocaine and marijuana yesterday. Previous Attempts/Gestures: Yes How many times?: 6 Triggers for Past Attempts: Hallucinations, Unpredictable Intentional Self Injurious Behavior: Cutting Comment - Self Injurious Behavior: pt reports to cutting himself when triggered Family Suicide History: No Recent stressful life event(s): Other (Comment) (hallucinations ) Persecutory voices/beliefs?: Yes Depression: No Substance abuse history and/or treatment for substance abuse?: No Suicide prevention information given to non-admitted patients: Not applicable  Risk to Others within the past 6 months Homicidal Ideation: No-Not Currently/Within Last 6 Months Does patient have any lifetime risk of violence toward others beyond the six months prior to admission? : Yes (comment) (pt reports he had thoughts of HI in the past) Thoughts of Harm to Others: No-Not Currently Present/Within Last 6 Months Comment - Thoughts of Harm to Others: pt has hx of HI  Current Homicidal Intent: No-Not Currently/Within Last 6  Months Current Homicidal Plan: No-Not Currently/Within Last 6 Months Access to Homicidal Means: No History of harm to others?: Yes Assessment of Violence: In past 6-12 months Violent Behavior Description: pt reports he has hx of HI and has harmed others in the past. Does patient have access to weapons?: No Criminal Charges Pending?: No Does patient have a court date: No Is patient on probation?: No  Psychosis Hallucinations: Auditory, Visual Delusions: Unspecified  Mental Status Report Appearance/Hygiene: In scrubs Eye Contact: Good Motor Activity: Freedom of movement Speech: Logical/coherent Level of Consciousness: Alert Mood: Apathetic Affect: Blunted, Flat Anxiety Level: None Thought Processes:  Coherent Judgement: Impaired Orientation: Person, Place, Time, Appropriate for developmental age, Situation Obsessive Compulsive Thoughts/Behaviors: None  Cognitive Functioning Concentration: Normal Memory: Remote Intact, Recent Intact IQ: Average Insight: Poor Impulse Control: Poor Appetite: Good Sleep: No Change Total Hours of Sleep: 8 Vegetative Symptoms: None  ADLScreening [facility] Assessment Services) Patient's cognitive ability adequate to safely complete daily activities?: Yes Patient able to express need for assistance with ADLs?: Yes Independently performs ADLs?: Yes (appropriate for developmental age)  Prior Inpatient Therapy Prior Inpatient Therapy: Yes Prior Therapy Dates: multiple Prior Therapy Facilty/Provider(s): several facilities Reason for Treatment: psychosis/ Schizophrenia  Prior Outpatient Therapy Prior Outpatient Therapy: Yes Prior Therapy Dates: 2018 Prior Therapy Facilty/Provider(s): [facility] Reason for Treatment: Schizophrenia Does patient have an ACCT team?: No Does patient have Intensive In-House Services?  : No Does patient have [facility] services? : Yes Does patient have P4CC services?: No  ADL Screening (condition at time of admission) Patient's cognitive ability adequate to safely complete daily activities?: Yes Is the patient deaf or have difficulty hearing?: No Does the patient have difficulty seeing, even when wearing glasses/contacts?: No Does the patient have difficulty concentrating, remembering, or making decisions?: No Patient able to express need for assistance with ADLs?: Yes Does the patient have difficulty dressing or bathing?: No Independently performs ADLs?: Yes (appropriate for developmental age) Does the patient have difficulty walking or climbing stairs?: No Weakness of Legs: None Weakness of Arms/Hands: None  Home Assistive Devices/Equipment Home Assistive Devices/Equipment: None    Abuse/Neglect Assessment (Assessment to  be complete while patient is alone) Physical Abuse: Denies Verbal Abuse: Yes, past (Comment) (in childhood ) Sexual Abuse: Denies Exploitation of patient/patient's resources: Denies Self-Neglect: Denies     Regulatory affairs officer (For Healthcare) Does Patient Have a Medical Advance Directive?: No Would patient like information on creating a medical advance directive?: No - Patient declined    Additional Information 1:1 In Past 12 Months?: No CIRT Risk: No Elopement Risk: No Does patient have medical clearance?: Yes     Disposition:  Disposition Initial Assessment Completed for this Encounter: Yes Disposition of Patient: Inpatient treatment program Type of inpatient treatment program: Adult (per [name], NP)  Lyanne Co 10/02/2016 9:30 PM

## 2016-10-03 DIAGNOSIS — F259 Schizoaffective disorder, unspecified: Secondary | ICD-10-CM | POA: Insufficient documentation

## 2016-10-03 NOTE — ED Notes (Signed)
Patient arrived to unit from TCU. Patient irritable and states "I'm gonna eat your face off". Patient went right to his bed and covered up on arrival to his room. No signs/symptoms of distress noted. Respirations regular and unlabored.

## 2016-10-03 NOTE — Progress Notes (Signed)
CSW faxed requested documents to [facility]. CSW contacted [facility] and confirmed documents received. CSW spoke with staff member Molli Barrows, staff reported patient accepted by Dr. Michaelle Birks and that report can be given to [facility] at [phone]).

## 2016-10-03 NOTE — ED Notes (Signed)
[facility] Staff informed that pt has knife locked up with security

## 2016-10-03 NOTE — ED Notes (Signed)
Patient transferred to [facility].  Report called to [facility], Therapist, sports.  Patient has been pleasant and cooperative this morning.  He left the unit ambulatory with [name].  All belongings were given to the officer.

## 2016-10-03 NOTE — BH Assessment (Signed)
Contacted Jinny Sanders at [facility] requests IVC paperwork be faxed to [phone] once patient has been served and he will call back with accepting physician and number for report.   Rosalin Hawking, LCSW Therapeutic Triage Specialist [place] 10/03/2016 8:16 AM

## 2016-10-03 NOTE — BH Assessment (Signed)
Pt has been referred to the following inpt facilities for possible admission: [facility], [facility], [place], [facility], [place], [place], [facility], [facility] [place], MSW, LCSWA

## 2016-10-03 NOTE — BH Assessment (Signed)
Pt has been offered a bed at [facility] contingent upon the pt being IVC'd. Spoke with [place], PA and advised pt must be IVC'd prior to transport.   Lind Covert, MSW, Latanya Presser

## 2016-10-03 NOTE — BH Assessment (Signed)
Received call from [facility]. They have a bed available for Pt but will only accept if Pt is placed under IVC. Notified [facility] and TTS staff.   Orpah Greek Anson Fret, LPC, [facility], [facility] Triage Specialist [phone]

## 2016-10-06 DIAGNOSIS — Z720 Tobacco use: Secondary | ICD-10-CM | POA: Insufficient documentation

## 2016-12-19 ENCOUNTER — Emergency Department (HOSPITAL_COMMUNITY)
Admission: EM | Admit: 2016-12-19 | Discharge: 2016-12-20 | Disposition: A | Discharge status: Other Acute Inpatient Hospital | Payer: Medicaid Other | Attending: Emergency Medicine | Admitting: Emergency Medicine

## 2016-12-19 ENCOUNTER — Encounter (HOSPITAL_COMMUNITY): Payer: Self-pay | Admitting: Emergency Medicine

## 2016-12-19 DIAGNOSIS — R45851 Suicidal ideations: Secondary | ICD-10-CM | POA: Diagnosis present

## 2016-12-19 DIAGNOSIS — R440 Auditory hallucinations: Secondary | ICD-10-CM | POA: Insufficient documentation

## 2016-12-19 DIAGNOSIS — Z87891 Personal history of nicotine dependence: Secondary | ICD-10-CM | POA: Diagnosis not present

## 2016-12-19 DIAGNOSIS — J45909 Unspecified asthma, uncomplicated: Secondary | ICD-10-CM | POA: Diagnosis not present

## 2016-12-19 DIAGNOSIS — F1290 Cannabis use, unspecified, uncomplicated: Secondary | ICD-10-CM | POA: Diagnosis not present

## 2016-12-19 DIAGNOSIS — F329 Major depressive disorder, single episode, unspecified: Secondary | ICD-10-CM | POA: Diagnosis not present

## 2016-12-19 LAB — CBC
HCT: 44.6 % (ref 39.0–52.0)
Hemoglobin: 15.2 g/dL (ref 13.0–17.0)
MCH: 28.2 pg (ref 26.0–34.0)
MCHC: 34.1 g/dL (ref 30.0–36.0)
MCV: 82.7 fL (ref 78.0–100.0)
Platelets: 265 10*3/uL (ref 150–400)
RBC: 5.39 MIL/uL (ref 4.22–5.81)
RDW: 12.3 % (ref 11.5–15.5)
WBC: 9 10*3/uL (ref 4.0–10.5)

## 2016-12-19 NOTE — ED Notes (Signed)
Called staffing office for sitter- no one available at this time

## 2016-12-19 NOTE — ED Triage Notes (Signed)
Per pt, he is feeling suicidal, wants to get help before "he crashes".  Pt has a hx of self harm and attempts.  States that he has had some stressors including the recent death of a friend.  Does get psy services at [facility].

## 2016-12-20 ENCOUNTER — Inpatient Hospital Stay (HOSPITAL_COMMUNITY)
Admission: AD | Admit: 2016-12-20 | Discharge: 2016-12-30 | DRG: 885 | Disposition: A | Discharge status: Home / Self Care | Payer: Medicaid Other | Source: Intra-hospital | Attending: Psychiatry | Admitting: Psychiatry

## 2016-12-20 DIAGNOSIS — E669 Obesity, unspecified: Secondary | ICD-10-CM | POA: Diagnosis present

## 2016-12-20 DIAGNOSIS — F319 Bipolar disorder, unspecified: Secondary | ICD-10-CM | POA: Diagnosis present

## 2016-12-20 DIAGNOSIS — R45850 Homicidal ideations: Secondary | ICD-10-CM | POA: Diagnosis present

## 2016-12-20 DIAGNOSIS — F250 Schizoaffective disorder, bipolar type: Secondary | ICD-10-CM | POA: Diagnosis not present

## 2016-12-20 DIAGNOSIS — Z881 Allergy status to other antibiotic agents status: Secondary | ICD-10-CM

## 2016-12-20 DIAGNOSIS — D181 Lymphangioma, any site: Secondary | ICD-10-CM | POA: Diagnosis not present

## 2016-12-20 DIAGNOSIS — Z79899 Other long term (current) drug therapy: Secondary | ICD-10-CM

## 2016-12-20 DIAGNOSIS — F4321 Adjustment disorder with depressed mood: Secondary | ICD-10-CM | POA: Diagnosis not present

## 2016-12-20 DIAGNOSIS — F1290 Cannabis use, unspecified, uncomplicated: Secondary | ICD-10-CM

## 2016-12-20 DIAGNOSIS — Z9119 Patient's noncompliance with other medical treatment and regimen: Secondary | ICD-10-CM | POA: Diagnosis not present

## 2016-12-20 DIAGNOSIS — F411 Generalized anxiety disorder: Secondary | ICD-10-CM | POA: Diagnosis present

## 2016-12-20 DIAGNOSIS — Z87891 Personal history of nicotine dependence: Secondary | ICD-10-CM | POA: Diagnosis not present

## 2016-12-20 DIAGNOSIS — R440 Auditory hallucinations: Secondary | ICD-10-CM

## 2016-12-20 DIAGNOSIS — F909 Attention-deficit hyperactivity disorder, unspecified type: Secondary | ICD-10-CM | POA: Diagnosis not present

## 2016-12-20 DIAGNOSIS — F17210 Nicotine dependence, cigarettes, uncomplicated: Secondary | ICD-10-CM | POA: Diagnosis present

## 2016-12-20 DIAGNOSIS — R45851 Suicidal ideations: Secondary | ICD-10-CM | POA: Diagnosis present

## 2016-12-20 DIAGNOSIS — F1421 Cocaine dependence, in remission: Secondary | ICD-10-CM | POA: Clinically undetermined

## 2016-12-20 DIAGNOSIS — F1511 Other stimulant abuse, in remission: Secondary | ICD-10-CM | POA: Clinically undetermined

## 2016-12-20 DIAGNOSIS — F1221 Cannabis dependence, in remission: Secondary | ICD-10-CM | POA: Diagnosis present

## 2016-12-20 DIAGNOSIS — Z8249 Family history of ischemic heart disease and other diseases of the circulatory system: Secondary | ICD-10-CM

## 2016-12-20 DIAGNOSIS — F6381 Intermittent explosive disorder: Secondary | ICD-10-CM | POA: Diagnosis present

## 2016-12-20 DIAGNOSIS — F4310 Post-traumatic stress disorder, unspecified: Secondary | ICD-10-CM | POA: Diagnosis present

## 2016-12-20 DIAGNOSIS — Z982 Presence of cerebrospinal fluid drainage device: Secondary | ICD-10-CM | POA: Diagnosis not present

## 2016-12-20 DIAGNOSIS — Z6841 Body Mass Index (BMI) 40.0 and over, adult: Secondary | ICD-10-CM

## 2016-12-20 DIAGNOSIS — R443 Hallucinations, unspecified: Secondary | ICD-10-CM | POA: Diagnosis not present

## 2016-12-20 DIAGNOSIS — Z62810 Personal history of physical and sexual abuse in childhood: Secondary | ICD-10-CM | POA: Diagnosis present

## 2016-12-20 DIAGNOSIS — F602 Antisocial personality disorder: Secondary | ICD-10-CM | POA: Diagnosis present

## 2016-12-20 DIAGNOSIS — G4700 Insomnia, unspecified: Secondary | ICD-10-CM | POA: Diagnosis present

## 2016-12-20 DIAGNOSIS — J45909 Unspecified asthma, uncomplicated: Secondary | ICD-10-CM | POA: Diagnosis present

## 2016-12-20 DIAGNOSIS — F17200 Nicotine dependence, unspecified, uncomplicated: Secondary | ICD-10-CM | POA: Diagnosis present

## 2016-12-20 LAB — RAPID URINE DRUG SCREEN, HOSP PERFORMED
Amphetamines: NOT DETECTED
Barbiturates: NOT DETECTED
Benzodiazepines: NOT DETECTED
Cocaine: NOT DETECTED
Opiates: NOT DETECTED
Tetrahydrocannabinol: NOT DETECTED

## 2016-12-20 LAB — COMPREHENSIVE METABOLIC PANEL
ALT: 29 U/L (ref 17–63)
AST: 35 U/L (ref 15–41)
Albumin: 4.1 g/dL (ref 3.5–5.0)
Alkaline Phosphatase: 97 U/L (ref 38–126)
Anion gap: 6 (ref 5–15)
BUN: 10 mg/dL (ref 6–20)
CO2: 24 mmol/L (ref 22–32)
Calcium: 9.2 mg/dL (ref 8.9–10.3)
Chloride: 108 mmol/L (ref 101–111)
Creatinine, Ser: 1.14 mg/dL (ref 0.61–1.24)
GFR calc Af Amer: >60 mL/min (ref >60)
GFR calc non Af Amer: >60 mL/min (ref >60)
Glucose, Bld: 121 mg/dL — ABNORMAL HIGH (ref 65–99)
Potassium: 4.2 mmol/L (ref 3.5–5.1)
Sodium: 138 mmol/L (ref 135–145)
Total Bilirubin: 0.7 mg/dL (ref 0.3–1.2)
Total Protein: 6.5 g/dL (ref 6.5–8.1)

## 2016-12-20 LAB — ETHANOL: Alcohol, Ethyl (B): <5 mg/dL (ref <5)

## 2016-12-20 LAB — ACETAMINOPHEN LEVEL: Acetaminophen (Tylenol), Serum: <10 ug/mL — ABNORMAL LOW (ref 10–30)

## 2016-12-20 LAB — LITHIUM LEVEL: Lithium Lvl: 0.51 mmol/L — ABNORMAL LOW (ref 0.60–1.20)

## 2016-12-20 LAB — SALICYLATE LEVEL: Salicylate Lvl: <7 mg/dL (ref 2.8–30.0)

## 2016-12-20 MED ORDER — ONDANSETRON HCL 4 MG PO TABS: 4.0000 mg | ORAL_TABLET | Freq: Three times a day (TID) | ORAL | Status: DC | PRN

## 2016-12-20 MED ORDER — ACETAMINOPHEN 325 MG PO TABS: 650.0000 mg | ORAL_TABLET | ORAL | Status: DC | PRN

## 2016-12-20 MED ORDER — NICOTINE 21 MG/24HR TD PT24: 21.0000 mg | MEDICATED_PATCH | Freq: Every day | TRANSDERMAL | Status: DC

## 2016-12-20 NOTE — ED Notes (Signed)
Patient currently in the shower.

## 2016-12-20 NOTE — BHH Counselor (Signed)
Pt faxed to the following facilities with open beds: [facility], [facility], [facility], [facility], [place], [place], [facility], [facility]

## 2016-12-20 NOTE — ED Triage Notes (Signed)
TC from [facility]  Pt accepted and can be transferred after 2130 . Room #  506- bed 1 . Accepting MD [place].

## 2016-12-20 NOTE — ED Provider Notes (Signed)
[facility] DEPT Provider Note   CSN: [id number] Arrival date & time: 12/19/16  2258     History   Chief Complaint Chief Complaint  Patient presents with  . Suicidal    HPI Justin Chaney is a 21 y.o. male.  Patient presents with c/o depression with suicidal thoughts without plan. No self harm prior to arrival. He has a history of schizophrenia, depression, asthma, polysubstance abuse. He states he takes his medication as prescribed. He states his best friend recently died and this has been difficult to deal with. He denies HI but is hearing voices which are not command voices.    The history is provided by the patient. No language interpreter was used.    Past Medical History:  Diagnosis Date  . Asthma   . Bipolar disorder ([place])   . 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 Depression   . Homelessness   . Schizophrenia ([place])   . Seizures [facility])     Patient Active Problem List   Diagnosis Date Noted  . Suicidal ideations   . Hallucinations   . Homicidal ideation   . Suicidal thoughts   . Cocaine use disorder, severe, dependence ([place]) 11/07/2015  . Amphetamine use disorder, severe, dependence ([place]) 11/07/2015  . Cannabis use disorder, moderate, in sustained remission ([place]) 11/07/2015  . Intermittent explosive disorder 09/05/2015  . Schizoaffective disorder, bipolar type ([place]) 09/04/2015  . Non compliance w medication regimen 01/17/2015    Past Surgical History:  Procedure Laterality Date  . TONSILLECTOMY    . VENTRICULO-PERITONEAL SHUNT PLACEMENT / LAPAROSCOPIC INSERTION PERITONEAL CATHETER         Home Medications    Prior to Admission medications   Medication Sig Start Date End Date Taking? Authorizing Provider  ARIPiprazole (ABILIFY) 10 MG tablet Take 10 mg by mouth daily.   Yes [provider]  lithium carbonate 300 MG capsule Take 300-600 mg by mouth See admin instructions. Take 1 capsule every morning and take 2  capsules at night 10/07/16  Yes [provider]  carbamazepine (TEGRETOL) 200 MG tablet Take 1 tablet (200 mg total) by mouth 2 (two) times daily after a meal. Patient not taking: Reported on 10/02/2016 09/11/16   [place], Elyse Jarvis, FNP  OLANZapine zydis (ZYPREXA) 10 MG disintegrating tablet Take 1 tablet (10 mg total) by mouth at bedtime. Patient not taking: Reported on 10/02/2016 09/11/16   Benjamine Mola, FNP    Family History Family History  Problem Relation Age of Onset  . Hypertension Mother   . Mental illness Neg Hx     Social History Social History  Substance Use Topics  . Smoking status: Former Smoker    Packs/day: 0.50    Types: Cigarettes    Quit date: 10/22/2015  . Smokeless tobacco: Never Used  . Alcohol use No     Allergies   Amoxicillin and Penicillins   Review of Systems Review of Systems  Constitutional: Negative for chills and fever.  HENT: Negative.   Respiratory: Negative.   Cardiovascular: Negative.   Gastrointestinal: Negative.   Musculoskeletal: Negative.   Skin: Negative.   Neurological: Negative.   Psychiatric/Behavioral: Positive for dysphoric mood, hallucinations and suicidal ideas.     Physical Exam Updated Vital Signs BP 119/65 (BP Location: Left Arm)   Pulse (!) 102   Temp 99 F (37.2 C) (Oral)   Resp 18   Ht 5\' 3"  (1.6 m)   SpO2 98%   Physical Exam  Constitutional:  He is oriented to person, place, and time. He appears well-developed and well-nourished.  HENT:  Head: Normocephalic.  Neck: Normal range of motion. Neck supple.  Cardiovascular: Normal rate and regular rhythm.   No murmur heard. Pulmonary/Chest: Effort normal and breath sounds normal. He has no wheezes. He has no rales.  Abdominal: Soft. Bowel sounds are normal. There is no tenderness. There is no rebound and no guarding.  Musculoskeletal: Normal range of motion.  Neurological: He is alert and oriented to person, place, and time.  Skin: Skin is warm and dry. No  rash noted.  Psychiatric: His speech is normal and behavior is normal. He is actively hallucinating. He exhibits a depressed mood. He expresses suicidal ideation.     ED Treatments / Results  Labs (all labs ordered are listed, but only abnormal results are displayed) Labs Reviewed  COMPREHENSIVE METABOLIC PANEL - Abnormal; Notable for the following:       Result Value   Glucose, Bld 121 (*)    All other components within normal limits  ACETAMINOPHEN LEVEL - Abnormal; Notable for the following:    Acetaminophen (Tylenol), Serum <10 (*)    All other components within normal limits  ETHANOL  SALICYLATE LEVEL  CBC  RAPID URINE DRUG SCREEN, HOSP PERFORMED  LITHIUM LEVEL   Results for orders placed or performed during the hospital encounter of 12/19/16  Comprehensive metabolic panel  Result Value Ref Range   Sodium 138 135 - 145 mmol/L   Potassium 4.2 3.5 - 5.1 mmol/L   Chloride 108 101 - 111 mmol/L   CO2 24 22 - 32 mmol/L   Glucose, Bld 121 (H) 65 - 99 mg/dL   BUN 10 6 - 20 mg/dL   Creatinine, Ser 1.14 0.61 - 1.24 mg/dL   Calcium 9.2 8.9 - 10.3 mg/dL   Total Protein 6.5 6.5 - 8.1 g/dL   Albumin 4.1 3.5 - 5.0 g/dL   AST 35 15 - 41 U/L   ALT 29 17 - 63 U/L   Alkaline Phosphatase 97 38 - 126 U/L   Total Bilirubin 0.7 0.3 - 1.2 mg/dL   GFR calc non Af Amer >60 >60 mL/min   GFR calc Af Amer >60 >60 mL/min   Anion gap 6 5 - 15  Ethanol  Result Value Ref Range   Alcohol, Ethyl (B) <5 <5 mg/dL  Salicylate level  Result Value Ref Range   Salicylate Lvl <4.5 2.8 - 30.0 mg/dL  Acetaminophen level  Result Value Ref Range   Acetaminophen (Tylenol), Serum <10 (L) 10 - 30 ug/mL  cbc  Result Value Ref Range   WBC 9.0 4.0 - 10.5 K/uL   RBC 5.39 4.22 - 5.81 MIL/uL   Hemoglobin 15.2 13.0 - 17.0 g/dL   HCT 44.6 39.0 - 52.0 %   MCV 82.7 78.0 - 100.0 fL   MCH 28.2 26.0 - 34.0 pg   MCHC 34.1 30.0 - 36.0 g/dL   RDW 12.3 11.5 - 15.5 %   Platelets 265 150 - 400 K/uL  Rapid urine drug  screen (hospital performed)  Result Value Ref Range   Opiates NONE DETECTED NONE DETECTED   Cocaine NONE DETECTED NONE DETECTED   Benzodiazepines NONE DETECTED NONE DETECTED   Amphetamines NONE DETECTED NONE DETECTED   Tetrahydrocannabinol NONE DETECTED NONE DETECTED   Barbiturates NONE DETECTED NONE DETECTED    EKG  EKG Interpretation None       Radiology No results found.  Procedures Procedures (including critical care time)  Medications Ordered in ED Medications  ondansetron (ZOFRAN) tablet 4 mg (not administered)  nicotine (NICODERM CQ - dosed in mg/24 hours) patch 21 mg (not administered)  acetaminophen (TYLENOL) tablet 650 mg (not administered)     Initial Impression / Assessment and Plan / ED Course  I have reviewed the triage vital signs and the nursing notes.  Pertinent labs & imaging results that were available during my care of the patient were reviewed by me and considered in my medical decision making (see chart for details).     Patient with history of schizophrenia, previous SI, presents with SI without plan or attempt at self harm prior to arrival. TTS consultation will be required to determine disposition.  4:10 - Per TTS consultation, the patient meets inpatient criteria and a bed is being sought. He is currently content being voluntary but it is felt with his auditory or sometimes visual hallucinations, history of severe schizophrenia and suicidal ideations that, if he wants to leave that IVC petition would be appropriate.  Final Clinical Impressions(s) / ED Diagnoses   Final diagnoses:  None   1. Suicidal ideation 2. Auditory hallucinations  New Prescriptions New Prescriptions   No medications on file     Charlann Lange, Hershal Coria 12/20/16 0413    Merryl Hacker, MD 12/22/16 [phone]
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
## 2016-12-20 NOTE — ED Notes (Signed)
[place] arrived  

## 2016-12-20 NOTE — Consult Note (Signed)
Telepsych Consultation   Reason for Consult: Consult for Justin Chaney, a 21yo male presenting with suicide ideations without any specific plan. Referring Physician:  EDP Patient Identification: Justin Chaney MRN:  [id number] Principal Diagnosis: <principal problem not specified> Diagnosis:   Patient Active Problem List   Diagnosis Date Noted  . Suicidal ideations [R45.851]   . Hallucinations [R44.3]   . Homicidal ideation [R45.850]   . Suicidal thoughts [R45.851]   . Cocaine use disorder, severe, dependence ([place]) [F14.20] 11/07/2015  . 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 Cannabis use disorder, moderate, in sustained remission ([place]) [F12.21] 11/07/2015  . Intermittent explosive disorder [F63.81] 09/05/2015  . Schizoaffective disorder, bipolar type ([place]) [F25.0] 09/04/2015  . Non compliance w medication regimen [Z91.14] 01/17/2015    Total Time spent with patient: 30 minutes  Subjective:   Justin Chaney is a 21 y.o. male patient admitted with SI without plan.  HPI: Per the assessment documented by Curlene Dolphin on 12/20/16, "Justin Chaney is an 21 y.o. male.  -Clinician reviewed note by Charlann Lange, PA.  Patient presents with c/o depression with suicidal thoughts without plan. No self harm prior to arrival. He has a history of schizophrenia, depression, asthma, polysubstance abuse. He states he takes his medication as prescribed. He states his best friend recently died and this has been difficult to deal with. He denies HI but is hearing voices which are not command voices.  Patient says that he lives with his mother.  He got his mother to bring him to [place].  Patient is having suicidal thoughts but without a plan.  Patient says that he has had multiple attempts in the past.  Patient says that the reason for his suicidality now is that he had a friend that died about a week & a half ago.    Patient denies HI.  He does hear  voices but they are not command in nature.  Patient says he sometimes will see "things from other dimensions like angels & demons."  Patient denies any substance abuse issues and his UDS is clear.  Patient gets medication management from [facility].  He has had multiple psychiatric hospitalizations with the last being at [facility] about two months ago.  -Clinician discussed patient care with [name], [place].  He recommends inpatient psychiatric care.  Clinician discussed disposition with Charlann Lange, PA and she is in agreement.  TTS to seek placement as there are no appropriate beds at [facility]  On Exam: Pt is alert and oriented x4, sitting upright in the hospital bed. Pt reiterated the above statement of why he's in the hospital. He continues to endorse suicide ideation without any specific plan. He reports increasing depression since the death of his friend. He also reporting both auditory and visual hallucination of seeing dead people. Pt reports living with his brother and will return there upon discharge.   Past Psychiatric History: Schizophrenia  Risk to Self: Suicidal Ideation: Yes-Currently Present Suicidal Intent: No Is patient at risk for suicide?: Yes Suicidal Plan?: No Access to Means: No What has been your use of drugs/alcohol within the last 12 months?: NOne How many times?:  (Multiple) Other Self Harm Risks: None Triggers for Past Attempts: Unpredictable Intentional Self Injurious Behavior: None Risk to Others: Homicidal Ideation: No Thoughts of Harm to Others: No Current Homicidal Intent: No Current Homicidal Plan: No Access to Homicidal Means: No Identified Victim: No one History of harm to others?: Yes Assessment of Violence: In distant past  Violent Behavior Description: Usually not fighting unless there is a reason to. Does patient have access to weapons?: No Criminal Charges Pending?: No Does patient have a court date: No Prior Inpatient Therapy: Prior  Inpatient Therapy: Yes Prior Therapy Dates: 2 months ago Prior Therapy Facilty/Provider(s): [facility]? Reason for Treatment: SI Prior Outpatient Therapy: Prior Outpatient Therapy: Yes Prior Therapy Dates: [place] Prior Therapy Facilty/Provider(s): [facility] Reason for Treatment: med management Does patient have an ACCT team?: No Does patient have Intensive In-House Services?  : No Does patient have [facility] services? : No Does patient have P4CC services?: Yes  Past Medical History:  Past Medical History:  Diagnosis Date  . Asthma   . Bipolar disorder ([place])   . 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 Depression   . Homelessness   . Schizophrenia ([place])   . Seizures ([place])     Past Surgical History:  Procedure Laterality Date  . TONSILLECTOMY    . VENTRICULO-PERITONEAL SHUNT PLACEMENT / LAPAROSCOPIC INSERTION PERITONEAL CATHETER     Family History:  Family History  Problem Relation Age of Onset  . Hypertension Mother   . Mental illness Neg Hx    Family Psychiatric  History: Unknown Social History:  History  Alcohol Use No     History  Drug Use  . Types: Marijuana, Other-see comments, Amphetamines    Comment: Adderall, "I don't do weed, only rarely"    Social History   Social History  . Marital status: Single    Spouse name: N/A  . Number of children: N/A  . Years of education: N/A   Social History Main Topics  . Smoking status: Former Smoker    Packs/day: 0.50    Types: Cigarettes    Quit date: 10/22/2015  . Smokeless tobacco: Never Used  . Alcohol use No  . Drug use: Yes    Types: Marijuana, Other-see comments, Amphetamines     Comment: Adderall, "I don't do weed, only rarely"  . Sexual activity: Not Asked   Other Topics Concern  . None   Social History Narrative  . None   Additional Social History:    Allergies:   Allergies  Allergen Reactions  . Amoxicillin Hives and Other (See Comments)    Has patient had a PCN  reaction causing immediate rash, facial/tongue/throat swelling, SOB or lightheadedness with hypotension: No Has patient had a PCN reaction causing severe rash involving mucus membranes or skin necrosis: No Has patient had a PCN reaction that required hospitalization No Has patient had a PCN reaction occurring within the last 10 years: No If all of the above answers are "NO", then may proceed with Cephalosporin use.  Marland Kitchen Penicillins Hives and Other (See Comments)    Has patient had a PCN reaction causing immediate rash, facial/tongue/throat swelling, SOB or lightheadedness with hypotension: No Has patient had a PCN reaction causing severe rash involving mucus membranes or skin necrosis: No Has patient had a PCN reaction that required hospitalization No Has patient had a PCN reaction occurring within the last 10 years: No If all of the above answers are "NO", then may proceed with Cephalosporin use.    Labs:  Results for orders placed or performed during the hospital encounter of 12/19/16 (from the past 48 hour(s))  Comprehensive metabolic panel     Status: Abnormal   Collection Time: 12/19/16 11:32 PM  Result Value Ref Range   Sodium 138 135 - 145 mmol/L   Potassium 4.2 3.5 - 5.1  mmol/L   Chloride 108 101 - 111 mmol/L   CO2 24 22 - 32 mmol/L   Glucose, Bld 121 (H) 65 - 99 mg/dL   BUN 10 6 - 20 mg/dL   Creatinine, Ser 1.14 0.61 - 1.24 mg/dL   Calcium 9.2 8.9 - 10.3 mg/dL   Total Protein 6.5 6.5 - 8.1 g/dL   Albumin 4.1 3.5 - 5.0 g/dL   AST 35 15 - 41 U/L   ALT 29 17 - 63 U/L   Alkaline Phosphatase 97 38 - 126 U/L   Total Bilirubin 0.7 0.3 - 1.2 mg/dL   GFR calc non Af Amer >60 >60 mL/min   GFR calc Af Amer >60 >60 mL/min    Comment: (NOTE) The eGFR has been calculated using the CKD EPI equation. This calculation has not been validated in all clinical situations. eGFR's persistently <60 mL/min signify possible Chronic Kidney Disease.    Anion gap 6 5 - 15  Ethanol     Status: None    Collection Time: 12/19/16 11:32 PM  Result Value Ref Range   Alcohol, Ethyl (B) <5 <5 mg/dL    Comment:        LOWEST DETECTABLE LIMIT FOR SERUM ALCOHOL IS 5 mg/dL FOR MEDICAL PURPOSES ONLY   Salicylate level     Status: None   Collection Time: 12/19/16 11:32 PM  Result Value Ref Range   Salicylate Lvl <9.3 2.8 - 30.0 mg/dL  Acetaminophen level     Status: Abnormal   Collection Time: 12/19/16 11:32 PM  Result Value Ref Range   Acetaminophen (Tylenol), Serum <10 (L) 10 - 30 ug/mL    Comment:        THERAPEUTIC CONCENTRATIONS VARY SIGNIFICANTLY. A RANGE OF 10-30 ug/mL MAY BE AN EFFECTIVE CONCENTRATION FOR MANY PATIENTS. HOWEVER, SOME ARE BEST TREATED AT CONCENTRATIONS OUTSIDE THIS RANGE. ACETAMINOPHEN CONCENTRATIONS >150 ug/mL AT 4 HOURS AFTER INGESTION AND >50 ug/mL AT 12 HOURS AFTER INGESTION ARE OFTEN ASSOCIATED WITH TOXIC REACTIONS.   cbc     Status: None   Collection Time: 12/19/16 11:32 PM  Result Value Ref Range   WBC 9.0 4.0 - 10.5 K/uL   RBC 5.39 4.22 - 5.81 MIL/uL   Hemoglobin 15.2 13.0 - 17.0 g/dL   HCT 44.6 39.0 - 52.0 %   MCV 82.7 78.0 - 100.0 fL   MCH 28.2 26.0 - 34.0 pg   MCHC 34.1 30.0 - 36.0 g/dL   RDW 12.3 11.5 - 15.5 %   Platelets 265 150 - 400 K/uL  Rapid urine drug screen (hospital performed)     Status: None   Collection Time: 12/19/16 11:34 PM  Result Value Ref Range   Opiates NONE DETECTED NONE DETECTED   Cocaine NONE DETECTED NONE DETECTED   Benzodiazepines NONE DETECTED NONE DETECTED   Amphetamines NONE DETECTED NONE DETECTED   Tetrahydrocannabinol NONE DETECTED NONE DETECTED   Barbiturates NONE DETECTED NONE DETECTED    Comment:        DRUG SCREEN FOR MEDICAL PURPOSES ONLY.  IF CONFIRMATION IS NEEDED FOR ANY PURPOSE, NOTIFY LAB WITHIN 5 DAYS.        LOWEST DETECTABLE LIMITS FOR URINE DRUG SCREEN Drug Class       Cutoff (ng/mL) Amphetamine      1000 Barbiturate      200 Benzodiazepine   570 Tricyclics       177 Opiates           300 Cocaine  300 THC              50   Lithium level     Status: Abnormal   Collection Time: 12/20/16  1:06 AM  Result Value Ref Range   Lithium Lvl 0.51 (L) 0.60 - 1.20 mmol/L    Current Facility-Administered Medications  Medication Dose Route Frequency Provider Last Rate Last Dose  . acetaminophen (TYLENOL) tablet 650 mg  650 mg Oral Q4H PRN Upstill, Shari, PA-C      . nicotine (NICODERM CQ - dosed in mg/24 hours) patch 21 mg  21 mg Transdermal Daily Upstill, Shari, PA-C      . ondansetron (ZOFRAN) tablet 4 mg  4 mg Oral Q8H PRN Charlann Lange, PA-C       Current Outpatient Prescriptions  Medication Sig Dispense Refill  . ARIPiprazole (ABILIFY) 10 MG tablet Take 10 mg by mouth daily.    Marland Kitchen lithium carbonate 300 MG capsule Take 300-600 mg by mouth See admin instructions. Take 1 capsule every morning and take 2 capsules at night    . carbamazepine (TEGRETOL) 200 MG tablet Take 1 tablet (200 mg total) by mouth 2 (two) times daily after a meal. (Patient not taking: Reported on 10/02/2016) 60 tablet 0  . OLANZapine zydis (ZYPREXA) 10 MG disintegrating tablet Take 1 tablet (10 mg total) by mouth at bedtime. (Patient not taking: Reported on 10/02/2016) 30 tablet 0    Musculoskeletal: UTA via telepsych  Psychiatric Specialty Exam: Physical Exam  Nursing note and vitals reviewed.   Review of Systems  Psychiatric/Behavioral: Positive for depression, hallucinations and suicidal ideas. Negative for memory loss and substance abuse. The patient is nervous/anxious and has insomnia.   All other systems reviewed and are negative.   Blood pressure 117/72, pulse (!) 110, temperature 97.7 F (36.5 C), temperature source Oral, resp. rate 19, height '5\' 3"'  (1.6 m), SpO2 98 %.There is no height or weight on file to calculate BMI.  General Appearance: pt on hospital gown  Eye Contact:  Good  Speech:  Clear and Coherent and Normal Rate  Volume:  Normal  Mood:  Anxious and Depressed  Affect:   Congruent  Thought Process:  Coherent  Orientation:  Full (Time, Place, and Person)  Thought Content:  WDL and Logical  Suicidal Thoughts:  Yes.  without intent/plan  Homicidal Thoughts:  No  Memory:  Immediate;   Fair Recent;   Fair Remote;   Fair  Judgement:  Good  Insight:  Fair  Psychomotor Activity:  Normal  Concentration:  Concentration: Good and Attention Span: Good  Recall:  [place] of Knowledge:  Good  Language:  Good  Akathisia:  NA  Handed:  Right  AIMS (if indicated):     Assets:  Communication Skills Desire for Improvement Financial Resources/Insurance  ADL's:  Intact  Cognition:  WNL  Sleep:        Treatment Plan Summary: Daily contact with patient to assess and evaluate symptoms and progress in treatment, Medication management and Plan to admit inpatient for medication managment and safety monitoring.    Disposition: Recommend psychiatric Inpatient admission when medically cleared. Supportive therapy provided about ongoing stressors. Refer to IOP. Discussed crisis plan, support from social network, calling 911, coming to the Emergency Department, and calling Suicide Hotline. Given patient's previous multiple suicide attempts  and current suicidal threats, patient meets the criteria for inpatient admission and will benefit from inpatient program.  Vicenta Aly, NP 12/20/2016 4:23 PM

## 2016-12-20 NOTE — Progress Notes (Signed)
Pt accepted to [facility] [id number]. Pt may arrive at 2130. Dr. Shea Evans accepting.  Call report to [phone].  Areatha Keas. Judi Cong, MSW, [place] Work Disposition [phone]

## 2016-12-20 NOTE — ED Notes (Signed)
Called Dr Ralene Bathe to complete EMTALA

## 2016-12-21 ENCOUNTER — Encounter (HOSPITAL_COMMUNITY): Payer: Self-pay

## 2016-12-21 DIAGNOSIS — G9608 Other cranial cerebrospinal fluid leak: Secondary | ICD-10-CM | POA: Insufficient documentation

## 2016-12-21 DIAGNOSIS — F17200 Nicotine dependence, unspecified, uncomplicated: Secondary | ICD-10-CM | POA: Diagnosis present

## 2016-12-21 DIAGNOSIS — F17210 Nicotine dependence, cigarettes, uncomplicated: Secondary | ICD-10-CM

## 2016-12-21 DIAGNOSIS — F250 Schizoaffective disorder, bipolar type: Principal | ICD-10-CM

## 2016-12-21 DIAGNOSIS — G960 Cerebrospinal fluid leak: Secondary | ICD-10-CM

## 2016-12-21 DIAGNOSIS — F1511 Other stimulant abuse, in remission: Secondary | ICD-10-CM

## 2016-12-21 DIAGNOSIS — F909 Attention-deficit hyperactivity disorder, unspecified type: Secondary | ICD-10-CM | POA: Insufficient documentation

## 2016-12-21 DIAGNOSIS — R45851 Suicidal ideations: Secondary | ICD-10-CM

## 2016-12-21 DIAGNOSIS — D181 Lymphangioma, any site: Secondary | ICD-10-CM

## 2016-12-21 DIAGNOSIS — F1421 Cocaine dependence, in remission: Secondary | ICD-10-CM | POA: Clinically undetermined

## 2016-12-21 DIAGNOSIS — F319 Bipolar disorder, unspecified: Secondary | ICD-10-CM | POA: Diagnosis present

## 2016-12-21 MED ORDER — ASENAPINE MALEATE 5 MG SL SUBL: 5.0000 mg | SUBLINGUAL_TABLET | Freq: Two times a day (BID) | SUBLINGUAL | Status: DC | PRN

## 2016-12-21 MED ORDER — TRAZODONE HCL 50 MG PO TABS
50.0000 mg | ORAL_TABLET | Freq: Every evening | ORAL | Status: DC | PRN
  Administered 2016-12-21 – 2016-12-29 (×9): 50 mg via ORAL
  Filled 2016-12-21 (×8): qty 1

## 2016-12-21 MED ORDER — MAGNESIUM HYDROXIDE 400 MG/5ML PO SUSP
30.0000 mL | Freq: Every day | ORAL | Status: DC | PRN
  Administered 2016-12-26: 30 mL via ORAL
  Filled 2016-12-21: qty 30

## 2016-12-21 MED ORDER — LITHIUM CARBONATE 300 MG PO CAPS
300.0000 mg | ORAL_CAPSULE | Freq: Every day | ORAL | Status: DC
  Administered 2016-12-21 – 2016-12-27 (×7): 300 mg via ORAL
  Filled 2016-12-21 (×9): qty 1

## 2016-12-21 MED ORDER — LITHIUM CARBONATE 300 MG PO CAPS
600.0000 mg | ORAL_CAPSULE | Freq: Every day | ORAL | Status: DC
  Administered 2016-12-21 – 2016-12-29 (×9): 600 mg via ORAL
  Filled 2016-12-21 (×12): qty 2

## 2016-12-21 MED ORDER — ALUM & MAG HYDROXIDE-SIMETH 200-200-20 MG/5ML PO SUSP
30.0000 mL | ORAL | Status: DC | PRN
  Administered 2016-12-23 – 2016-12-28 (×3): 30 mL via ORAL
  Filled 2016-12-21 (×3): qty 30

## 2016-12-21 MED ORDER — ARIPIPRAZOLE 10 MG PO TABS
10.0000 mg | ORAL_TABLET | Freq: Every day | ORAL | Status: DC
  Administered 2016-12-21 – 2016-12-24 (×4): 10 mg via ORAL
  Filled 2016-12-21 (×7): qty 1

## 2016-12-21 MED ORDER — HYDROXYZINE HCL 25 MG PO TABS
25.0000 mg | ORAL_TABLET | Freq: Three times a day (TID) | ORAL | Status: DC | PRN
  Administered 2016-12-21 – 2016-12-29 (×9): 25 mg via ORAL
  Filled 2016-12-21 (×9): qty 1

## 2016-12-21 MED ORDER — NICOTINE 21 MG/24HR TD PT24
21.0000 mg | MEDICATED_PATCH | Freq: Every day | TRANSDERMAL | Status: DC
  Administered 2016-12-23 – 2016-12-29 (×7): 21 mg via TRANSDERMAL
  Filled 2016-12-21 (×12): qty 1

## 2016-12-21 NOTE — Progress Notes (Signed)
Date: 12/21/16 Time: 1000 Location: 500 Hall Dayroom  Group Topic: Leisure Education  Goal Area(s) Addresses:  Patient will identify positive leisure activities.  Patient will identify one positive benefit of participation in leisure activities.   Behavioral Response: Engaged  Intervention: Visual merchandiser, scissors, glue sticks  Activity: Leisure Armed forces training and education officer.  Patients were picture themselves as the heads of their own community centers.  Patients were to identify pictures of activities they would offer at their community. Patients were to cut out the pictures and make a collage of the various activities they identified.  Education:  Leisure Education, Dentist  Education Outcome: Acknowledges education/In group clarification offered/Needs additional education  Clinical Observations/Feedback: Pt arrived late but was able to get on task and complete the activity.  Pt stated he would offer cooking classes, music lessons and business classes at his community center.  Pt explained that using his leisure time more effectively would help him stay focused and away from the wrong people.   Victorino Sparrow, LRT/CTRS        Victorino Sparrow A 12/21/2016 11:43 AM
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
## 2016-12-21 NOTE — [facility] Group Notes (Signed)
[place] LCSW Group Therapy  12/21/2016 , 1:56 PM   Type of Therapy:  Group Therapy  Participation Level:  Active  Participation Quality:  Attentive  Affect:  Appropriate  Cognitive:  Alert  Insight:  Improving  Engagement in Therapy:  Engaged  Modes of Intervention:  Discussion, Exploration and Socialization  Summary of Progress/Problems: Today's group focused on the term Diagnosis.  Participants were asked to define the term, and then pronounce whether it is a negative, positive or neutral term. In and out of group multiple times.  Cited his mother, sister and brother as main supports.  "I've been staying with them for 2 months now.  It's a great situation, I have my own space."  Justin Chaney B 12/21/2016 , 1:56 PM

## 2016-12-21 NOTE — Social Work (Signed)
Referred to [facility], is [place] resident.  Justin Prosser, LCSW Lead Clinical Social Worker Phone:  [phone]  

## 2016-12-21 NOTE — Progress Notes (Signed)
Recreation Therapy Notes  Animal-Assisted Activity (AAA) Program Checklist/Progress Notes Patient Eligibility Criteria Checklist & Daily Group note for Rec Tx Intervention  Date: 05.22.2018 Time: 2:45pm Location: [address]    AAA/T Program Assumption of Risk Form signed by Patient/ or Parent Legal Guardian Yes  Patient is free of allergies or sever asthma Yes  Patient reports no fear of animals Yes  Patient reports no history of cruelty to animals Yes  Patient understands his/her participation is voluntary Yes  Patient washes hands before animal contact Yes  Patient washes hands after animal contact Yes  Behavioral Response: Appropriate    Education: Hand Washing, Appropriate Animal Interaction   Education Outcome: Acknowledges education.   Clinical Observations/Feedback: Patient discussed with MD for appropriateness in pet therapy session. Both LRT and MD agree patient is appropriate for participation. Patient offered participation in session and signed necessary consent form without issue. Patient attended and participated appropriately in pet therapy session, petting therapy dog appropriately and interacting with peers appropriately.   Laureen Ochs Shayleigh Bouldin, LRT/CTRS        Lane Hacker 12/21/2016 3:23 PM

## 2016-12-21 NOTE — Progress Notes (Signed)
Recreation Therapy Notes  INPATIENT RECREATION THERAPY ASSESSMENT  Patient Details Name: Justin Chaney MRN: [id number] DOB: [birth year] Today's Date: Jan 15, 2017  Patient Stressors: Death, Other (Comment)  Pt stated he was here for suicidal thoughts, depression and hearing voices. Pt stated his best friend dying last week and dealing with adulthood were both stressors.  Coping Skills:   Isolate, Avoidance, Exercise, Art/Dance, Talking, Music  Personal Challenges: Anger, Communication, Concentration, Expressing Yourself, Social Interaction, Stress Management, Time Management, Trusting Others, Work Midwife (2+):  Music - Write music, Music - Listen, Social - Family, Individual - Other (Comment) Lacinda Axon, go out)  Futures trader Resources:  Yes  Community Resources:  Sondra Barges, [place]  Current Use: Yes  Patient Strengths:  Strong minded, good heart  Patient Identified Areas of Improvement:  Listening, following instructions  Current Recreation Participation:  Everyday  Patient Goal for Hospitalization:  "To get better, take meds and get the right dosage".  [place] of Residence:  [place] of Residence:  [place]  Current [place] (including self-harm):  No  Current HI:  No  Consent to Intern Participation: N/A   Victorino Sparrow, LRT/CTRS  Victorino Sparrow A 15-Jan-2017, 3:04 PM

## 2016-12-21 NOTE — Tx Team (Signed)
Initial Treatment Plan 12/21/2016 12:43 AM Justin Chaney TM[facility]:[id number]    PATIENT STRESSORS: Loss of friend recently Other: self harm thoughts/ increased auditory hallucinations   PATIENT STRENGTHS: Ability for insight Average or above average intelligence Communication skills General fund of knowledge Motivation for treatment/growth   PATIENT IDENTIFIED PROBLEMS: "I've been hearing more voices recently"  "been depressed recently"  "my friend died a week and a half ago"  Passive suicidal ideation               DISCHARGE CRITERIA:  Ability to meet basic life and health needs Improved stabilization in mood, thinking, and/or behavior Need for constant or close observation no longer present Safe-care adequate arrangements made Verbal commitment to aftercare and medication compliance  PRELIMINARY DISCHARGE PLAN: Outpatient therapy Return to previous living arrangement  PATIENT/FAMILY INVOLVEMENT: This treatment plan has been presented to and reviewed with the patient, Justin Chaney. The patient and family have been given the opportunity to ask questions and make suggestions.  Elenore Rota, RN 12/21/2016, 12:43 AM

## 2016-12-21 NOTE — Progress Notes (Signed)
Patient has reported decreased mood and increased SI due to death of close friend.  Patient denies SI, HI and AVH at this time.  Patient has attended groups and been compliant with medications.  Patient has no behavioral dyscontrol this shift.   Assess patient for safety, offer medications as prescribed, engage patient in 1:1 staff talks.

## 2016-12-21 NOTE — BHH Counselor (Signed)
Adult Comprehensive Assessment  Patient ID: Diontay Rosencrans, male   DOB: 1996/04/24, 21 y.o.   MRN: [id number]   Information Source: Information source: Patient  Current Stressors:  Educational / Learning stressors: No GED Employment / Job issues: Engineer, building services / Lack of resources (include bankruptcy):Fixed income Physical health (include injuries & life threatening diseases): Has a shunt that he has not had checked for quite awhile  Living/Environment/Situation:  Living Arrangements: Family Living conditions (as described by patient or guardian):Good  Has been staying with mother for a couple of months  States it is both supportive and comfortable  Family History:  Marital status: Single Does patient have children?: No  Childhood History:  By whom was/is the patient raised?: Mother/father and step-parent Additional childhood history information: never met father Description of patient's relationship with caregiver when they were a child: never had a good relationship with mom-"I'm the black sheep of the family"  Patient's description of current relationship with people who raised him/her: see above Does patient have siblings?: Yes Number of Siblings: 2 Description of patient's current relationship with siblings: 26 yo half sister, 27 YO half brother-he is annoying as hell Did patient suffer any verbal/emotional/physical/sexual abuse as a child?: No Did patient suffer from severe childhood neglect?: No Has patient ever been sexually abused/assaulted/raped as an adolescent or adult?: No Was the patient ever a victim of a crime or a disaster?: No Witnessed domestic violence?: No Has patient been effected by domestic violence as an adult?: No  Education:  Highest grade of school patient has completed: 9th (I was kicked out of school for missing too many days) Currently a Ship broker?: No Learning disability?: No  Employment/Work Situation:  Employment  situation: Disability-has been getting it for about 9 months now What is the longest time patient has a held a job?: 2 months 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 [employer] Has patient ever been in the [employer]?: No Has patient ever served in Recruitment consultant?: No  Financial Resources:  Copywriter, advertising, [employer] Does patient have a Programmer, applications or guardian?: No  Alcohol/Substance Abuse:  What has been your use of drugs/alcohol within the last 12 months?:States he has not used for a year, but UDS, while not positive this time, was positive for cocaine in March of this year during ED visit Alcohol/Substance Abuse Treatment Hx: Denies past history Has alcohol/substance abuse ever caused legal problems?: No  Social Support System:  Heritage manager System: None Type of faith/religion: Spirituality How does patient's faith help to cope with current illness?: Meditation  Leisure/Recreation:  Leisure and Hobbies: Write music, dance, cook  Strengths/Needs:  What things does the patient do well?: Like to help people out In what areas does patient struggle / problems for patient: Easily distracted.   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  He presents with worsening symptoms of sadness, crying spells, as well as SI. Furthermore, he identifes symptoms of AH, VH and Paranoia  Daley reports that the  current trigger is his good  friend passing away last week from a seizure. At d/c, he will return home with family and follow up with [facility].  He can benefit from crises stabilization, medication managment, therapeutic milieu and referral for TCT services through [facility].  [name]. 12/21/2016

## 2016-12-21 NOTE — BHH Suicide Risk Assessment (Signed)
[facility] Admission Suicide Risk Assessment   Nursing information obtained from:  Patient Demographic factors:  Male, Low socioeconomic status Current Mental Status:  Suicidal ideation indicated by others Loss Factors:  Loss of significant relationship (Friend died recently) Historical Factors:  Prior suicide attempts, Impulsivity Risk Reduction Factors:  Living with another person, especially a relative  Total Time spent with patient: 20 minutes Principal Problem: Schizoaffective disorder, bipolar type ([place]) Diagnosis:   Patient Active Problem List   Diagnosis Date Noted  . Cocaine use disorder, severe, in early remission ([place]) [F14.21] 12/21/2016  . 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 ADHD (attention deficit hyperactivity disorder) [F90.9] 12/21/2016  . 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 Tobacco use disorder [F17.200] 12/21/2016  . Tobacco use [Z72.0] 10/06/2016  . Homicidal ideations [R45.850] 09/16/2016  . Suicidal ideation [R45.851]   . Hallucinations [R44.3]   . Homicidal ideation [R45.850]   . Suicidal thoughts [R45.851]   . Cannabis use disorder, moderate, in sustained remission ([place]) [F12.21] 11/07/2015  . Depression [F32.9] 10/29/2015  . Intermittent explosive disorder [F63.81] 09/05/2015  . Schizoaffective disorder, bipolar type ([place]) [F25.0] 09/04/2015  . Malingering [Z76.5] 06/05/2015  . Antisocial personality disorder [F60.2] 06/03/2015  . Cocaine abuse [F14.10] 06/03/2015   Subjective Data: Please see H&P.   Continued Clinical Symptoms:  Alcohol Use Disorder Identification Test Final Score (AUDIT): 0 The "Alcohol Use Disorders Identification Test", Guidelines for Use in Primary Care, Second Edition.  World Pharmacologist [facility]). Score between 0-7:  no or low risk or alcohol related problems. Score between 8-15:  moderate risk of alcohol related problems. Score between 16-19:  high risk of alcohol related problems. Score 20 or above:   warrants further diagnostic evaluation for alcohol dependence and treatment.   CLINICAL FACTORS:   Alcohol/Substance Abuse/Dependencies Currently Psychotic Previous Psychiatric Diagnoses and Treatments   Musculoskeletal: Strength & Muscle Tone: within normal limits Gait & Station: normal Patient leans: N/A  Psychiatric Specialty Exam: Physical Exam  ROS  Blood pressure 116/70, pulse 86, temperature 98 F (36.7 C), temperature source Oral, resp. rate 16, height 5\' 3"  (1.6 m), weight 107 kg (236 lb), SpO2 100 %.Body mass index is 41.81 kg/m.                              Please see H&P.                             COGNITIVE FEATURES THAT CONTRIBUTE TO RISK:  Closed-mindedness, Polarized thinking and Thought constriction (tunnel vision)    SUICIDE RISK:   Moderate:  Frequent suicidal ideation with limited intensity, and duration, some specificity in terms of plans, no associated intent, good self-control, limited dysphoria/symptomatology, some risk factors present, and identifiable protective factors, including available and accessible social support.  PLAN OF CARE: Please see H&P.   I certify that inpatient services furnished can reasonably be expected to improve the patient's condition.   Feven Alderfer, MD 12/21/2016, 10:32 AM

## 2016-12-21 NOTE — Progress Notes (Addendum)
Patient ID: Justin Chaney, male   DOB: 12-Jul-1996, 21 y.o.   MRN: [id number]  21 year old male presents to [facility] with complaints of increased auditory hallucinations and passive thoughts of suicide. Pt denies any plan while at [facility], verbally agrees to contact staff before acting on any harmful thoughts. Pt endorses intermittent visual hallucinations of shadows. Pt reports that he currently lives with his mother and brother, has been taking his prescribed medications and has not been using any substances as has in the past. Pt main complaint is grief over the loss of a friend of his a week and a half ago. Pt reports that while he is at [facility] he would like "go to groups and make new friends." Pt currently presents with a blunted affect and manipulative behavior. Pt has a past history of asthma, AV stent from hydrocephaly as a child, childhood seizures, schizophrenia, bipolar and depression. Consents signed, skin/belongings search completed and pt oriented to unit. Pt stable at this time. Pt given the opportunity to express concerns and ask questions. Pt given toiletries and prn medications. Currently lying in bed with eyes closed. Will continue to monitor.

## 2016-12-22 LAB — LIPID PANEL
Cholesterol: 130 mg/dL (ref 0–200)
HDL: 35 mg/dL — ABNORMAL LOW (ref >40)
LDL Cholesterol: 56 mg/dL (ref 0–99)
Total CHOL/HDL Ratio: 3.7 RATIO
Triglycerides: 193 mg/dL — ABNORMAL HIGH (ref <150)
VLDL: 39 mg/dL (ref 0–40)

## 2016-12-22 LAB — TSH: TSH: 3.427 u[IU]/mL (ref 0.350–4.500)

## 2016-12-22 NOTE — BHH Group Notes (Signed)
[place] Group Notes:  (Counselor/Nursing/MHT/Case Management/Adjunct)  12/22/2016 1:15PM  Type of Therapy:  Group Therapy  Participation Level:  Active  Participation Quality:  Appropriate  Affect:  Flat  Cognitive:  Oriented  Insight:  Improving  Engagement in Group:  Limited  Engagement in Therapy:  Limited  Modes of Intervention:  Discussion, Exploration and Socialization  Summary of Progress/Problems: The topic for group was balance in life.  Pt participated in the discussion about when their life was in balance and out of balance and how this feels.  Pt discussed ways to get back in balance and short term goals they can work on to get where they want to be. Stayed the entire time, minimal engagement, but not disruptive.  "I feel pretty balanced today.  I'm trying not to worry and stay focused on the positives.  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   Roque Lias B 12/22/2016 3:20 PM

## 2016-12-22 NOTE — Progress Notes (Signed)
Patient has reported decreased mood and increased SI due to death of close friend.  Patient denies SI, HI and AVH at this time.  Patient has attended groups and been compliant with medications.  Patient has no behavioral dyscontrol this shift.   Assess patient for safety, offer medications as prescribed, engage patient in 1:1 staff talks.   Continue to monitor patient as prescribed, patient able to contract for safety.

## 2016-12-22 NOTE — Plan of Care (Signed)
Problem: Safety: Goal: Periods of time without injury will increase Outcome: Progressing Patient is on q15 minute safety checks and contracts for safety on the unit. Patient currently free of injury.

## 2016-12-22 NOTE — Progress Notes (Signed)
Recreation Therapy Notes  Date: 12/22/16 Time: 1000 Location: 500 Hall Dayroom  Group Topic: Wellness  Goal Area(s) Addresses:  Patient will define components of whole wellness. Patient will verbalize benefit of whole wellness.  Behavioral Response: Engaged  Intervention:  2 small beach balls, chairs  Activity: Group Juggle.  Patients were seated in a circle.  Patients were to pass the ball back and forth to each other.  Once the the patients got the first ball going in a good rotation, LRT added a second ball.  Patients had to keep both balls going.  The balls could bounce off the floor but the could not come to a complete stop.  Education: Wellness, Dentist.   Education Outcome: Acknowledges education/In group clarification offered/Needs additional education.   Clinical Observations/Feedback: Pt was social with peers and bright.  Pt was engaged throughout activity.  Pt encouraged peers to hit the balls softly to keep them going.   Victorino Sparrow, LRT/CTRS         Victorino Sparrow A 12/22/2016 12:23 PM

## 2016-12-22 NOTE — Tx Team (Signed)
Interdisciplinary Treatment and Diagnostic Plan Update  12/22/2016 Time of Session: 8:52 AM  Gearald Stonebraker MRN: [id number]  Principal Diagnosis: Schizoaffective disorder, bipolar type (HCC)  Secondary Diagnoses: Principal Problem:   Schizoaffective disorder, bipolar type (HCC) Active Problems:   Intermittent explosive disorder   Cannabis use disorder, moderate, in sustained remission (HCC)   Cocaine use disorder, severe, in early remission (HCC)   Amphetamine abuse in remission   Tobacco use disorder   Current Medications:  Current Facility-Administered Medications  Medication Dose Route Frequency Provider Last Rate Last Dose  . alum & mag hydroxide-simeth (MAALOX/MYLANTA) 200-200-20 MG/5ML suspension 30 mL  30 mL Oral Q4H PRN Okonkwo, Justina A, NP      . ARIPiprazole (ABILIFY) tablet 10 mg  10 mg Oral QHS Jomarie Longs, MD   10 mg at 12/21/16 2037  . asenapine (SAPHRIS) sublingual tablet 5 mg  5 mg Sublingual BID PRN Eappen, Saramma, MD      . hydrOXYzine (ATARAX/VISTARIL) tablet 25 mg  25 mg Oral TID PRN Beryle Lathe, Justina A, NP   25 mg at 12/21/16 0032  . lithium carbonate capsule 300 mg  300 mg Oral Daily Eappen, Levin Bacon, MD   300 mg at 12/22/16 0724  . lithium carbonate capsule 600 mg  600 mg Oral Q supper Jomarie Longs, MD   600 mg at 12/21/16 1713  . magnesium hydroxide (MILK OF MAGNESIA) suspension 30 mL  30 mL Oral Daily PRN Okonkwo, Justina A, NP      . nicotine (NICODERM CQ - dosed in mg/24 hours) patch 21 mg  21 mg Transdermal Daily Okonkwo, Justina A, NP      . traZODone (DESYREL) tablet 50 mg  50 mg Oral QHS PRN Okonkwo, Justina A, NP   50 mg at 12/21/16 2156    PTA Medications: Prescriptions Prior to Admission  Medication Sig Dispense Refill Last Dose  . ARIPiprazole (ABILIFY) 10 MG tablet Take 10 mg by mouth daily.   12/19/2016 at Unknown time  . carbamazepine (TEGRETOL) 200 MG tablet Take 1 tablet (200 mg total) by mouth 2 (two) times daily after a meal.  (Patient not taking: Reported on 10/02/2016) 60 tablet 0 Not Taking at Unknown time  . lithium carbonate 300 MG capsule Take 300-600 mg by mouth See admin instructions. Take 1 capsule every morning and take 2 capsules at night   12/19/2016 at Unknown time  . OLANZapine zydis (ZYPREXA) 10 MG disintegrating tablet Take 1 tablet (10 mg total) by mouth at bedtime. (Patient not taking: Reported on 10/02/2016) 30 tablet 0 Not Taking at Unknown time    Treatment Modalities: Medication Management, Group therapy, Case management,  1 to 1 session with clinician, Psychoeducation, Recreational therapy.   Physician Treatment Plan for Primary Diagnosis: Schizoaffective disorder, bipolar type (HCC) Long Term Goal(s): Improvement in symptoms so as ready for discharge  Short Term Goals: Ability to verbalize feelings will improve Compliance with prescribed medications will improve   Medication Management: Evaluate patient's response, side effects, and tolerance of medication regimen.  Therapeutic Interventions: 1 to 1 sessions, Unit Group sessions and Medication administration.  Evaluation of Outcomes: Progressing  Physician Treatment Plan for Secondary Diagnosis: Principal Problem:   Schizoaffective disorder, bipolar type (HCC) Active Problems:   Intermittent explosive disorder   Cannabis use disorder, moderate, in sustained remission (HCC)   Cocaine use disorder, severe, in early remission (HCC)   Amphetamine abuse in remission   Tobacco use disorder   Long Term Goal(s): Improvement in symptoms so as  ready for discharge  Short Term Goals: Ability to verbalize feelings will improve Compliance with prescribed medications will improve   Medication Management: Evaluate patient's response, side effects, and tolerance of medication regimen.  Therapeutic Interventions: 1 to 1 sessions, Unit Group sessions and Medication administration.  Evaluation of Outcomes: Progressing   RN Treatment Plan for  Primary Diagnosis: Schizoaffective disorder, bipolar type ([place]) Long Term Goal(s): Knowledge of disease and therapeutic regimen to maintain health will improve  Short Term Goals: Ability to identify and develop effective coping behaviors will improve and Compliance with prescribed medications will improve  Medication Management: RN will administer medications as ordered by provider, will assess and evaluate patient's response and provide education to patient for prescribed medication. RN will report any adverse and/or side effects to prescribing provider.  Therapeutic Interventions: 1 on 1 counseling sessions, Psychoeducation, Medication administration, Evaluate responses to treatment, Monitor vital signs and CBGs as ordered, Perform/monitor CIWA, COWS, AIMS and Fall Risk screenings as ordered, Perform wound care treatments as ordered.  Evaluation of Outcomes: Progressing   LCSW Treatment Plan for Primary Diagnosis: Schizoaffective disorder, bipolar type ([place]) Long Term Goal(s): Safe transition to appropriate next level of care at discharge, Engage patient in therapeutic group addressing interpersonal concerns.  Short Term Goals: Engage patient in aftercare planning with referrals and resources  Therapeutic Interventions: Assess for all discharge needs, 1 to 1 time with Social worker, Explore available resources and support systems, Assess for adequacy in community support network, Educate family and significant other(s) on suicide prevention, Complete Psychosocial Assessment, Interpersonal group therapy.  Evaluation of Outcomes: Met Return home, follow up [facility]   Progress in Treatment: Attending groups: Yes Participating in groups: Yes Taking medication as prescribed: Yes Toleration medication: Yes, no side effects reported at this time Family/Significant other contact made: No Patient understands diagnosis:No  Limited insight.  Apparently feels safe in the hospital based on number of  recent past hospitalizations and ED visits Discussing patient identified problems/goals with staff: Yes Medical problems stabilized or resolved: Yes Denies suicidal/homicidal ideation: Yes Issues/concerns per patient self-inventory: None Other: N/A  New problem(s) identified: None identified at this time.   New Short Term/Long Term Goal(s): None identified at this time.   Discharge Plan or Barriers:   Reason for Continuation of Hospitalization:  Delusions  Depression Hallucinations Medication stabilization   Estimated Length of Stay: 3-5 days  Attendees: Patient: 12/22/2016  8:52 AM  Physician: Ursula Alert, MD 12/22/2016  8:52 AM  Nursing: Hoy Register, RN 12/22/2016  8:52 AM  RN Care Manager: Lars Pinks, RN 12/22/2016  8:52 AM  Social Worker: Ripley Fraise 12/22/2016  8:52 AM  Recreational Therapist: Laretta Bolster  12/22/2016  8:52 AM  Other: Norberto Sorenson 12/22/2016  8:52 AM  Other:  12/22/2016  8:52 AM    Scribe for Treatment Team:  Roque Lias LCSW 12/22/2016 8:52 AM

## 2016-12-22 NOTE — Progress Notes (Signed)
Nursing Progress Note [id number]  D) Patient presents calm and cooperative on the unit. Patient minimizes reasons for admission and states he believes in many conspiracy theories that the rest of the world "calls me crazy for". Patient states "did you know they are cloning [name] now? They have been doing it since the 1930s". Patient watching basketball game this evening and approached the nurses station multiple times this evening. Patient directed away from the nurses station. Patient reports passive SI and AVH. Patient contracts for safety on the unit. Patient requested medications for sleep.  A)  Emotional support given. 1:1 interaction and active listening provided. Patient medicated as prescribed. Medications and plan of care reviewed with patient. Patient verbalized understanding without further questions.  Snacks and fluids provided. Opportunities for questions or concerns presented to patient. Patient encouraged to continue to work on treatment goals. Labs, vital signs and patient behavior monitored throughout shift. Patient safety maintained with q15 min safety checks. Low fall risk precautions in place and reviewed with patient; patient verbalized understanding.  R) Patient receptive to interaction with nurse. Patient remains safe on the unit at this time. Patient denies any adverse medication reactions at this time. Patient is resting in bed without complaints. Will continue to monitor.

## 2016-12-22 NOTE — Progress Notes (Signed)
[facility] MD Progress Note  12/22/2016 2:52 PM Justin Chaney  MRN:  [id number] Subjective:  Justin Chaney states " I am still suicidal , but getting better.'  Objective:Patient seen and chart reviewed.Discussed patient with treatment team.  Pt today seen as anxious , reports sleep last night as restless since his room mate was disruptive. Pt reports he is tolerating medications well, denies any ADRs. Continues to have passive SI on and off, he is trying to cope. Continue treatment.     Principal Problem: Schizoaffective disorder, bipolar type ([place]) Diagnosis:   Patient Active Problem List   Diagnosis Date Noted  . Cocaine use disorder, severe, in early remission ([place]) [F14.21] 12/21/2016  . 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 ADHD (attention deficit hyperactivity disorder) [F90.9] 12/21/2016  . 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 Tobacco use disorder [F17.200] 12/21/2016  . Tobacco use [Z72.0] 10/06/2016  . Homicidal ideations [R45.850] 09/16/2016  . Suicidal ideation [R45.851]   . Hallucinations [R44.3]   . Homicidal ideation [R45.850]   . Suicidal thoughts [R45.851]   . Cannabis use disorder, moderate, in sustained remission ([place]) [F12.21] 11/07/2015  . Depression [F32.9] 10/29/2015  . Intermittent explosive disorder [F63.81] 09/05/2015  . Schizoaffective disorder, bipolar type ([place]) [F25.0] 09/04/2015  . Malingering [Z76.5] 06/05/2015  . Antisocial personality disorder [F60.2] 06/03/2015  . Cocaine abuse [F14.10] 06/03/2015   Total Time spent with patient: 20 minutes  Past Psychiatric History: Please see H&P.   Past Medical History:  Past Medical History:  Diagnosis Date  . Asthma   . Bipolar disorder ([place])   . 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 Depression   . Homelessness   . Schizophrenia ([place])   . Seizures ([place])    childhood seizures    Past Surgical History:  Procedure Laterality Date  . TONSILLECTOMY    .  VENTRICULO-PERITONEAL SHUNT PLACEMENT / LAPAROSCOPIC INSERTION PERITONEAL CATHETER     Family History:  Family History  Problem Relation Age of Onset  . Hypertension Mother   . Mental illness Neg Hx    Family Psychiatric  History: Please see H&P.  Social History: Please see H&P.  History  Alcohol Use No     History  Drug Use  . Types: Marijuana, Other-see comments, Amphetamines    Comment: Adderall, "I don't do weed, only rarely"    Social History   Social History  . Marital status: Single    Spouse name: N/A  . Number of children: N/A  . Years of education: N/A   Social History Main Topics  . Smoking status: Former Smoker    Packs/day: 0.50    Types: Cigarettes    Quit date: 10/22/2015  . Smokeless tobacco: Never Used  . Alcohol use No  . Drug use: Yes    Types: Marijuana, Other-see comments, Amphetamines     Comment: Adderall, "I don't do weed, only rarely"  . Sexual activity: Not Asked   Other Topics Concern  . None   Social History Narrative  . None   Additional Social History:    Pain Medications: None Prescriptions: Abilify and Lithium Over the Counter: None History of alcohol / drug use?: No history of alcohol / drug abuse Longest period of sobriety (when/how long): unknown                    Sleep: Poor  Appetite:  Fair  Current Medications: Current Facility-Administered Medications  Medication Dose Route Frequency Provider Last Rate Last  Dose  . alum & mag hydroxide-simeth (MAALOX/MYLANTA) 200-200-20 MG/5ML suspension 30 mL  30 mL Oral Q4H PRN Okonkwo, Justina A, NP      . ARIPiprazole (ABILIFY) tablet 10 mg  10 mg Oral QHS Ursula Alert, MD   10 mg at 12/21/16 2037  . asenapine (SAPHRIS) sublingual tablet 5 mg  5 mg Sublingual BID PRN Alabama Doig, MD      . hydrOXYzine (ATARAX/VISTARIL) tablet 25 mg  25 mg Oral TID PRN Lu Duffel, Justina A, NP   25 mg at 12/21/16 0032  . lithium carbonate capsule 300 mg  300 mg Oral Daily Glenisha Gundry,  Ria Clock, MD   300 mg at 12/22/16 0724  . lithium carbonate capsule 600 mg  600 mg Oral Q supper Ursula Alert, MD   600 mg at 12/21/16 1713  . magnesium hydroxide (MILK OF MAGNESIA) suspension 30 mL  30 mL Oral Daily PRN Okonkwo, Justina A, NP      . nicotine (NICODERM CQ - dosed in mg/24 hours) patch 21 mg  21 mg Transdermal Daily Okonkwo, Justina A, NP      . traZODone (DESYREL) tablet 50 mg  50 mg Oral QHS PRN Lu Duffel, Justina A, NP   50 mg at 12/21/16 2156    Lab Results:  Results for orders placed or performed during the hospital encounter of 12/20/16 (from the past 48 hour(s))  TSH     Status: None   Collection Time: 12/22/16  6:18 AM  Result Value Ref Range   TSH 3.427 0.350 - 4.500 uIU/mL    Comment: Performed by a 3rd Generation assay with a functional sensitivity of <=0.01 uIU/mL. Performed at [facility], [place] [address], [place], [place] [address]   Lipid panel     Status: Abnormal   Collection Time: 12/22/16  6:18 AM  Result Value Ref Range   Cholesterol 130 0 - 200 mg/dL   Triglycerides 193 (H) <150 mg/dL   HDL 35 (L) >40 mg/dL   Total CHOL/HDL Ratio 3.7 RATIO   VLDL 39 0 - 40 mg/dL   LDL Cholesterol 56 0 - 99 mg/dL    Comment:        Total Cholesterol/HDL:CHD Risk Coronary Heart Disease Risk Table                     Men   Women  1/2 Average Risk   3.4   3.3  Average Risk       5.0   4.4  2 X Average Risk   9.6   7.1  3 X Average Risk  23.4   11.0        Use the calculated Patient Ratio above and the CHD Risk Table to determine the patient's CHD Risk.        ATP III CLASSIFICATION (LDL):  <100     mg/dL   Optimal  100-129  mg/dL   Near or Above                    Optimal  130-159  mg/dL   Borderline  160-189  mg/dL   High  >190     mg/dL   Very High Performed at [facility] [address], [place], [place] [address]     Blood Alcohol level:  Lab Results  Component Value Date   [facility] <5 12/19/2016   ETH <5 76/72/0947     Metabolic Disorder Labs: Lab Results  Component Value  Date   HGBA1C 5.0 09/09/2015   MPG 97 09/09/2015   Lab Results  Component Value Date   PROLACTIN 8.0 09/09/2015   Lab Results  Component Value Date   CHOL 130 12/22/2016   TRIG 193 (H) 12/22/2016   HDL 35 (L) 12/22/2016   CHOLHDL 3.7 12/22/2016   VLDL 39 12/22/2016   LDLCALC 56 12/22/2016   LDLCALC 84 09/09/2015    Physical Findings: AIMS: Facial and Oral Movements Muscles of Facial Expression: None, normal Lips and Perioral Area: None, normal Jaw: None, normal Tongue: None, normal,Extremity Movements Upper (arms, wrists, hands, fingers): None, normal Lower (legs, knees, ankles, toes): None, normal, Trunk Movements Neck, shoulders, hips: None, normal, Overall Severity Severity of abnormal movements (highest score from questions above): None, normal Incapacitation due to abnormal movements: None, normal Patient's awareness of abnormal movements (rate only patient's report): No Awareness, Dental Status Current problems with teeth and/or dentures?: No Does patient usually wear dentures?: No  CIWA:    COWS:     Musculoskeletal: Strength & Muscle Tone: within normal limits Gait & Station: normal Patient leans: N/A  Psychiatric Specialty Exam: Physical Exam  Review of Systems  Psychiatric/Behavioral: Positive for depression and suicidal ideas. The patient is nervous/anxious and has insomnia.   All other systems reviewed and are negative.   Blood pressure (!) 103/47, pulse 87, temperature 98 F (36.7 C), temperature source Oral, resp. rate 16, height 5\' 3"  (1.6 m), weight 107 kg (236 lb), SpO2 100 %.Body mass index is 41.81 kg/m.  General Appearance: Casual  Eye Contact:  Fair  Speech:  Normal Rate  Volume:  Decreased  Mood:  Anxious and Dysphoric  Affect:  Congruent  Thought Process:  Goal Directed and Descriptions of Associations: Intact  Orientation:  Full (Time, Place, and Person)  Thought Content:   Hallucinations: Auditory and Paranoid Ideation  Suicidal Thoughts:  Yes.  without intent/plan, passive on and off  Homicidal Thoughts:  No  Memory:  Immediate;   Fair Recent;   Fair Remote;   Fair  Judgement:  Impaired  Insight:  Shallow  Psychomotor Activity:  Normal  Concentration:  Concentration: Fair and Attention Span: Fair  Recall:  [employer] of Knowledge:  Fair  Language:  Fair  Akathisia:  No  Handed:  Right  AIMS (if indicated):     Assets:  Desire for Improvement  ADL's:  Intact  Cognition:  WNL  Sleep:  Number of Hours: 3     Treatment Plan Summary:Patient presented after the recent death of his friend , continues to make progress, sleep continues to be an issues, continue treatment. Daily contact with patient to assess and evaluate symptoms and progress in treatment, Medication management and Plan see below   Will continue Abilify 10 mg po qhs for psychosis. Will continue Li 300 mg - 600 mg po daily and with supper for mood sx. Li level - 12/21/16- 0.51, repeat . Will continue Trazodone 50 mg po qhs prn for insomnia. TSH reviewed - wnl. EKG reviewed - qtc - wnl. CSW will continue to work on disposition.   Cruzita Lipa, MD 12/22/2016, 2:52 PM

## 2016-12-22 NOTE — Progress Notes (Signed)
Adult Psychoeducational Group Note  Date:  12/22/2016 Time:  8:55 PM  Group Topic/Focus:  Wrap-Up Group:   The focus of this group is to help patients review their daily goal of treatment and discuss progress on daily workbooks.  Participation Level:  Active  Participation Quality:  Appropriate  Affect:  Appropriate  Cognitive:  Oriented  Insight: Limited  Engagement in Group:  Engaged  Modes of Intervention:  Socialization and Support  Additional Comments:  Patient attended and participated in group tonight. He reports that his day was great. He learnt some new things about himself. He talked with his mother today.  Salley Scarlet [facility] 12/22/2016, 8:55 PM

## 2016-12-23 LAB — HEMOGLOBIN A1C
Hgb A1c MFr Bld: 5 % (ref 4.8–5.6)
Mean Plasma Glucose: 97 mg/dL

## 2016-12-23 LAB — PROLACTIN: Prolactin: 2.4 ng/mL — ABNORMAL LOW (ref 4.0–15.2)

## 2016-12-23 NOTE — Plan of Care (Signed)
Problem: Medication: Goal: Compliance with prescribed medication regimen will improve Outcome: Progressing Patient is compliant with medication regime.

## 2016-12-23 NOTE — Progress Notes (Signed)
Adult Psychoeducational Group Note  Date:  12/23/2016 Time:  10:35 AM  Group Topic/Focus:  Making Healthy Choices:   The focus of this group is to help patients identify negative/unhealthy choices they were using prior to admission and identify positive/healthier coping strategies to replace them upon discharge.  Participation Level:  Did Not Attend   Justin Chaney 12/23/2016, 10:35 AM

## 2016-12-23 NOTE — Plan of Care (Signed)
Problem: Activity: Goal: Interest or engagement in activities will improve Outcome: Progressing Patient attending groups. Patient interactive with staff and peers.

## 2016-12-23 NOTE — [facility] Group Notes (Signed)
[facility] Mental Health Association Group Therapy  12/23/2016 , 5:22 PM    Type of Therapy:  Mental Health Association Presentation  Participation Level:  Active  Participation Quality:  Attentive  Affect:  Blunted  Cognitive:  Oriented  Insight:  Limited  Engagement in Therapy:  Engaged  Modes of Intervention:  Discussion, Education and Socialization  Summary of Progress/Problems:  [name] from [facility] came to present his recovery story and play the guitar.  Stayed the entire time, slept through most of group.  Apologized afterwards  [name] 12/23/2016 , 5:22 PM

## 2016-12-23 NOTE — Progress Notes (Signed)
DAR NOTE: Patient presents with calm affect and mood is appropriate to situation.  reports good night sleep.  Reports hearing voices but contracts for safety. Described energy level as normal and concentration as good.  Rates depression at 6, hopelessness at 6, and anxiety at 0.  Maintained on routine safety checks.  Medications given as prescribed.  Support and encouragement offered as needed.  Attended group and participated.  States goal for today is "to get better."  Patient observed socializing with peers in the dayroom.  Offered no complaint.

## 2016-12-23 NOTE — Progress Notes (Signed)
[facility] MD Progress Note  12/23/2016 5:59 PM Justin Chaney  MRN:  [id number] Subjective:  Patient reports ongoing sense of loss and depression related to the recent death of a friend. He describes passive SI, but denies plan or intention of hurting self . He denies medication side effects.  Objective: I have discussed case with staff and have met with patient. He is a 21 year old man, known to our unit from prior psychiatric admissions, history of schizoaffective disorder and substance abuse. Presented for worsening depression, suicidal ideations, psychotic symptoms. Reports he has been experiencing auditory hallucinations telling him to hurt people/ himself, and that he feels the TV is talking to him directly at times .States worsening mood symptoms are related to recent unexpected death of a friend. Patient has a history of substance abuse, but states he has not been using drugs or alcohol recently. He is prescribed Lithium, Abilify, states he was taking regularly, but had stopped the antipsychotic a few days prior to this admission. On unit, he is presenting calm, visible on unit, going to some groups, no disruptive or agitated behaviors . Denies medication side effects.       Principal Problem: Schizoaffective disorder, bipolar type ([place]) Diagnosis:   Patient Active Problem List   Diagnosis Date Noted  . Cocaine use disorder, severe, in early remission ([place]) [F14.21] 12/21/2016  . 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 ADHD (attention deficit hyperactivity disorder) [F90.9] 12/21/2016  . 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 Tobacco use disorder [F17.200] 12/21/2016  . Tobacco use [Z72.0] 10/06/2016  . Homicidal ideations [R45.850] 09/16/2016  . Suicidal ideation [R45.851]   . Hallucinations [R44.3]   . Homicidal ideation [R45.850]   . Suicidal thoughts [R45.851]   . Cannabis use disorder, moderate, in sustained remission ([place]) [F12.21] 11/07/2015  . Depression  [F32.9] 10/29/2015  . Intermittent explosive disorder [F63.81] 09/05/2015  . Schizoaffective disorder, bipolar type ([place]) [F25.0] 09/04/2015  . Malingering [Z76.5] 06/05/2015  . Antisocial personality disorder [F60.2] 06/03/2015  . Cocaine abuse [F14.10] 06/03/2015   Total Time spent with patient: 20 minutes   Past Psychiatric History: Please see H&P.   Past Medical History:  Past Medical History:  Diagnosis Date  . Asthma   . Bipolar disorder ([place])   . 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 Depression   . Homelessness   . Schizophrenia ([place])   . Seizures ([place])    childhood seizures    Past Surgical History:  Procedure Laterality Date  . TONSILLECTOMY    . VENTRICULO-PERITONEAL SHUNT PLACEMENT / LAPAROSCOPIC INSERTION PERITONEAL CATHETER     Family History:  Family History  Problem Relation Age of Onset  . Hypertension Mother   . Mental illness Neg Hx    Family Psychiatric  History: Please see H&P.  Social History: Please see H&P.  History  Alcohol Use No     History  Drug Use  . Types: Marijuana, Other-see comments, Amphetamines    Comment: Adderall, "I don't do weed, only rarely"    Social History   Social History  . Marital status: Single    Spouse name: N/A  . Number of children: N/A  . Years of education: N/A   Social History Main Topics  . Smoking status: Former Smoker    Packs/day: 0.50    Types: Cigarettes    Quit date: 10/22/2015  . Smokeless tobacco: Never Used  . Alcohol use No  . Drug use: Yes    Types:  Marijuana, Other-see comments, Amphetamines     Comment: Adderall, "I don't do weed, only rarely"  . Sexual activity: Not Asked   Other Topics Concern  . None   Social History Narrative  . None   Additional Social History:    Pain Medications: None Prescriptions: Abilify and Lithium Over the Counter: None History of alcohol / drug use?: No history of alcohol / drug abuse Longest period of sobriety  (when/how long): unknown  Sleep: Fair  Appetite:  Fair  Current Medications: Current Facility-Administered Medications  Medication Dose Route Frequency Provider Last Rate Last Dose  . alum & mag hydroxide-simeth (MAALOX/MYLANTA) 200-200-20 MG/5ML suspension 30 mL  30 mL Oral Q4H PRN Okonkwo, Justina A, NP   30 mL at 12/23/16 0332  . ARIPiprazole (ABILIFY) tablet 10 mg  10 mg Oral QHS Ursula Alert, MD   10 mg at 12/22/16 2123  . asenapine (SAPHRIS) sublingual tablet 5 mg  5 mg Sublingual BID PRN Eappen, Saramma, MD      . hydrOXYzine (ATARAX/VISTARIL) tablet 25 mg  25 mg Oral TID PRN Lu Duffel, Justina A, NP   25 mg at 12/22/16 1607  . lithium carbonate capsule 300 mg  300 mg Oral Daily Eappen, Ria Clock, MD   300 mg at 12/23/16 0747  . lithium carbonate capsule 600 mg  600 mg Oral Q supper Ursula Alert, MD   600 mg at 12/23/16 1658  . magnesium hydroxide (MILK OF MAGNESIA) suspension 30 mL  30 mL Oral Daily PRN Okonkwo, Justina A, NP      . nicotine (NICODERM CQ - dosed in mg/24 hours) patch 21 mg  21 mg Transdermal Daily Okonkwo, Justina A, NP   21 mg at 12/23/16 0746  . traZODone (DESYREL) tablet 50 mg  50 mg Oral QHS PRN Lu Duffel, Justina A, NP   50 mg at 12/22/16 2123    Lab Results:  Results for orders placed or performed during the hospital encounter of 12/20/16 (from the past 48 hour(s))  TSH     Status: None   Collection Time: 12/22/16  6:18 AM  Result Value Ref Range   TSH 3.427 0.350 - 4.500 uIU/mL    Comment: Performed by a 3rd Generation assay with a functional sensitivity of <=0.01 uIU/mL. Performed at [facility], [place] [address] [place], [place] [address]   Prolactin     Status: Abnormal   Collection Time: 12/22/16  6:18 AM  Result Value Ref Range   Prolactin 2.4 (L) 4.0 - 15.2 ng/mL    Comment: (NOTE) Performed At: [facility] [place], [place] [id number] [name] MD [id number] Performed at [facility], [place] [address], [place],  [address]   Lipid panel     Status: Abnormal   Collection Time: 12/22/16  6:18 AM  Result Value Ref Range   Cholesterol 130 0 - 200 mg/dL   Triglycerides 193 (H) <150 mg/dL   HDL 35 (L) >40 mg/dL   Total CHOL/HDL Ratio 3.7 RATIO   VLDL 39 0 - 40 mg/dL   LDL Cholesterol 56 0 - 99 mg/dL    Comment:        Total Cholesterol/HDL:CHD Risk Coronary Heart Disease Risk Table                     Men   Women  1/2 Average Risk   3.4   3.3  Average Risk       5.0  4.4  2 X Average Risk   9.6   7.1  3 X Average Risk  23.4   11.0        Use the calculated Patient Ratio above and the CHD Risk Table to determine the patient's CHD Risk.        ATP III CLASSIFICATION (LDL):  <100     mg/dL   Optimal  100-129  mg/dL   Near or Above                    Optimal  130-159  mg/dL   Borderline  160-189  mg/dL   High  >190     mg/dL   Very High Performed at [facility] [address], [place], [place] [address]   Hemoglobin A1c     Status: None   Collection Time: 12/22/16  6:18 AM  Result Value Ref Range   Hgb A1c MFr Bld 5.0 4.8 - 5.6 %    Comment: (NOTE)         Pre-diabetes: 5.7 - 6.4         Diabetes: >6.4         Glycemic control for adults with diabetes: <7.0    Mean Plasma Glucose 97 mg/dL    Comment: (NOTE) Performed At: [facility] [place], [place] [id number] [name] MD [id number] Performed at [facility], [place] [address] [place], [place] [address]     Blood Alcohol level:  Lab Results  Component Value Date   [facility] <5 12/19/2016   ETH <5 92/33/0076    Metabolic Disorder Labs: Lab Results  Component Value Date   HGBA1C 5.0 12/22/2016   MPG 97 12/22/2016   MPG 97 09/09/2015   Lab Results  Component Value Date   PROLACTIN 2.4 (L) 12/22/2016   PROLACTIN 8.0 09/09/2015   Lab Results  Component Value Date   CHOL 130 12/22/2016   TRIG 193 (H)  12/22/2016   HDL 35 (L) 12/22/2016   CHOLHDL 3.7 12/22/2016   VLDL 39 12/22/2016   LDLCALC 56 12/22/2016   LDLCALC 84 09/09/2015    Physical Findings: AIMS: Facial and Oral Movements Muscles of Facial Expression: None, normal Lips and Perioral Area: None, normal Jaw: None, normal Tongue: None, normal,Extremity Movements Upper (arms, wrists, hands, fingers): None, normal Lower (legs, knees, ankles, toes): None, normal, Trunk Movements Neck, shoulders, hips: None, normal, Overall Severity Severity of abnormal movements (highest score from questions above): None, normal Incapacitation due to abnormal movements: None, normal Patient's awareness of abnormal movements (rate only patient's report): No Awareness, Dental Status Current problems with teeth and/or dentures?: No Does patient usually wear dentures?: No  CIWA:    COWS:     Musculoskeletal: Strength & Muscle Tone: within normal limits Gait & Station: normal Patient leans: N/A  Psychiatric Specialty Exam: Physical Exam  Review of Systems  Psychiatric/Behavioral: Positive for depression and suicidal ideas. The patient is nervous/anxious and has insomnia.   All other systems reviewed and are negative. denies chest pain, denies shortness of breath, no vomiting   Blood pressure (!) 109/44, pulse 98, temperature 97.5 F (36.4 C), temperature source Oral, resp. rate 20, height '5\' 3"'  (1.6 m), weight 107 kg (236 lb), SpO2 100 %.Body mass index is 41.81 kg/m.  General Appearance: Fairly Groomed  Eye Contact:  Good  Speech:  Normal Rate  Volume:  Decreased  Mood:  depressed   Affect:  constricted, but reactive, briefly  tearful when discussing death of friend, as above   Thought Process:  Linear and Descriptions of Associations: Intact  Orientation:  Other:  fully alert and attentive   Thought Content:  describes auditory hallucinations, does not currently present internally preoccupied   Suicidal Thoughts:  No, denies plan or  intention of suicide or of hurting self, contracts for safety on unit, endorses passive SI at times.   Homicidal Thoughts:  No  Memory:  Recent and remote grossly intact   Judgement:  Fair  Insight:  Fair  Psychomotor Activity:  Normal  Concentration:  Concentration: Good and Attention Span: Good  Recall:  Good  Fund of Knowledge:  Good  Language:  Good  Akathisia:  Negative  Handed:  Right  AIMS (if indicated):     Assets:  Desire for Improvement Physical Health Resilience  ADL's:  Intact  Cognition:  WNL  Sleep:  Number of Hours: 4.75     Assessment :patient presents with some improvement , but continues to report depression, grief , and auditory hallucinations. Does not appear internally preoccupied or grossly thought disordered. He is endorsing some intermittent passive SI, but denying active SI , and contracting for safety on unit . Tolerating Li and Abilify well .   Plan:  Reviewed as below today 5/24.  Encourage milieu and group participation to work on coping skills and symptom reduction Will continue Abilify 10 mg po qhs for psychosis. Will continue LiCO3  300 mg QAM and - 600 mg QPM for mood disorder  Will continue Trazodone 50 mg QHS PRN  for insomnia. Will continue Vistaril 25 mgrs Q 8 hours PRN for anxiety  Treatment team working on disposition planning options     Jenne Campus, MD 12/23/2016, 5:59 PM   Patient ID: [name], male   DOB: 1996/06/28, 21 y.o.   MRN: [id number]

## 2016-12-23 NOTE — Progress Notes (Signed)
Pt attended the evening karaoke group. Pt was engaged  and appropriate. Clint Bolder, NT 12/23/16 9:49 PM

## 2016-12-23 NOTE — Progress Notes (Signed)
Nursing Progress Note [id number]  D) Patient presents calm and cooperative. Patient is seen interactive in the milieu. Patient reports passive SI but denies HI. Patient reports hearing voices but contracts for safety on the unit. Patient reports sleeping well with current regimen. Patient transferred rooms this evening. Patient informed potential for roommate, patient was accepting.  A)  Emotional support given. 1:1 interaction and active listening provided. Patient medicated as prescribed. Medications and plan of care reviewed with patient. Patient verbalized understanding without further questions.  Snacks and fluids provided. Opportunities for questions or concerns presented to patient. Patient encouraged to continue to work on treatment goals. Labs, vital signs and patient behavior monitored throughout shift. Patient safety maintained with q15 min safety checks. Low fall risk precautions in place and reviewed with patient; patient verbalized understanding.  R) Patient receptive to interaction with nurse. Patient remains safe on the unit at this time. Patient denies any adverse medication reactions at this time. Patient is resting in bed without complaints. Will continue to monitor.

## 2016-12-23 NOTE — Plan of Care (Signed)
Problem: Safety: Goal: Ability to disclose and discuss suicidal ideas will improve Outcome: Progressing Patient denies suicidal thoughts and ideation.

## 2016-12-24 DIAGNOSIS — Z87891 Personal history of nicotine dependence: Secondary | ICD-10-CM

## 2016-12-24 DIAGNOSIS — F4321 Adjustment disorder with depressed mood: Secondary | ICD-10-CM

## 2016-12-24 DIAGNOSIS — R443 Hallucinations, unspecified: Secondary | ICD-10-CM

## 2016-12-24 NOTE — Progress Notes (Signed)
Adult Psychoeducational Group Note  Date:  12/24/2016 Time:  9:10 PM  Group Topic/Focus:  Wrap-Up Group:   The focus of this group is to help patients review their daily goal of treatment and discuss progress on daily workbooks.  Participation Level:  Active  Participation Quality:  Appropriate  Affect:  Appropriate  Cognitive:  Appropriate  Insight: Appropriate  Engagement in Group:  Engaged  Modes of Intervention:  Discussion  Additional Comments:  The patient expressed that he attended groups.The patient also said that he rates today a 9.  Nash Shearer 12/24/2016, 9:10 PM

## 2016-12-24 NOTE — Progress Notes (Signed)
[facility] MD Progress Note  12/24/2016 2:35 PM Josephine Rudnick  MRN:  [id number] Subjective:  Patient states that he is ok, still hears voices.  States he slept well.  Objective: I have discussed case with staff and have met with patient. Trammell has been to [facility] with several previous admissions, history of schizoaffective disorder and substance abuse. Presented ED with voices and suicidal ideations due to recent unexpected death of a friend.  Medications PTA include Lithium, Abilify, states he was taking regularly, but had stopped the antipsychotic a few days prior to this admission.  Principal Problem: Schizoaffective disorder, bipolar type ([place]) Diagnosis:   Patient Active Problem List   Diagnosis Date Noted  . Schizoaffective disorder, bipolar type ([place]) [F25.0] 09/04/2015    Priority: High  . Cocaine use disorder, severe, in early remission ([place]) [F14.21] 12/21/2016  . 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 ADHD (attention deficit hyperactivity disorder) [F90.9] 12/21/2016  . 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 Tobacco use disorder [F17.200] 12/21/2016  . Tobacco use [Z72.0] 10/06/2016  . Homicidal ideations [R45.850] 09/16/2016  . Suicidal ideation [R45.851]   . Hallucinations [R44.3]   . Homicidal ideation [R45.850]   . Suicidal thoughts [R45.851]   . Cannabis use disorder, moderate, in sustained remission ([place]) [F12.21] 11/07/2015  . Depression [F32.9] 10/29/2015  . Intermittent explosive disorder [F63.81] 09/05/2015  . Malingering [Z76.5] 06/05/2015  . Antisocial personality disorder [F60.2] 06/03/2015  . Cocaine abuse [F14.10] 06/03/2015   Total Time spent with patient: 20 minutes   Past Psychiatric History: Please see H&P.   Past Medical History:  Past Medical History:  Diagnosis Date  . Asthma   . Bipolar disorder ([place])   . 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 Depression   . Homelessness   . Schizophrenia ([place])   .  Seizures ([place])    childhood seizures    Past Surgical History:  Procedure Laterality Date  . TONSILLECTOMY    . VENTRICULO-PERITONEAL SHUNT PLACEMENT / LAPAROSCOPIC INSERTION PERITONEAL CATHETER     Family History:  Family History  Problem Relation Age of Onset  . Hypertension Mother   . Mental illness Neg Hx    Family Psychiatric  History: Please see H&P.  Social History: Please see H&P.  History  Alcohol Use No     History  Drug Use  . Types: Marijuana, Other-see comments, Amphetamines    Comment: Adderall, "I don't do weed, only rarely"    Social History   Social History  . Marital status: Single    Spouse name: N/A  . Number of children: N/A  . Years of education: N/A   Social History Main Topics  . Smoking status: Former Smoker    Packs/day: 0.50    Types: Cigarettes    Quit date: 10/22/2015  . Smokeless tobacco: Never Used  . Alcohol use No  . Drug use: Yes    Types: Marijuana, Other-see comments, Amphetamines     Comment: Adderall, "I don't do weed, only rarely"  . Sexual activity: Not Asked   Other Topics Concern  . None   Social History Narrative  . None   Additional Social History:    Pain Medications: None Prescriptions: Abilify and Lithium Over the Counter: None History of alcohol / drug use?: No history of alcohol / drug abuse Longest period of sobriety (when/how long): unknown  Sleep: Fair  Appetite:  Fair  Current Medications: Current Facility-Administered Medications  Medication Dose Route Frequency Provider Last Rate  Last Dose  . alum & mag hydroxide-simeth (MAALOX/MYLANTA) 200-200-20 MG/5ML suspension 30 mL  30 mL Oral Q4H PRN Okonkwo, Justina A, NP   30 mL at 12/23/16 0332  . ARIPiprazole (ABILIFY) tablet 10 mg  10 mg Oral QHS Ursula Alert, MD   10 mg at 12/23/16 2002  . asenapine (SAPHRIS) sublingual tablet 5 mg  5 mg Sublingual BID PRN Eappen, Saramma, MD      . hydrOXYzine (ATARAX/VISTARIL) tablet 25 mg  25 mg Oral TID PRN  Lu Duffel, Justina A, NP   25 mg at 12/24/16 1202  . lithium carbonate capsule 300 mg  300 mg Oral Daily Eappen, Ria Clock, MD   300 mg at 12/24/16 0829  . lithium carbonate capsule 600 mg  600 mg Oral Q supper Ursula Alert, MD   600 mg at 12/23/16 1658  . magnesium hydroxide (MILK OF MAGNESIA) suspension 30 mL  30 mL Oral Daily PRN Okonkwo, Justina A, NP      . nicotine (NICODERM CQ - dosed in mg/24 hours) patch 21 mg  21 mg Transdermal Daily Okonkwo, Justina A, NP   21 mg at 12/24/16 0830  . traZODone (DESYREL) tablet 50 mg  50 mg Oral QHS PRN Lu Duffel, Justina A, NP   50 mg at 12/22/16 2123    Lab Results:  No results found for this or any previous visit (from the past 48 hour(s)).  Blood Alcohol level:  Lab Results  Component Value Date   ETH <5 12/19/2016   ETH <5 16/05/9603    Metabolic Disorder Labs: Lab Results  Component Value Date   HGBA1C 5.0 12/22/2016   MPG 97 12/22/2016   MPG 97 09/09/2015   Lab Results  Component Value Date   PROLACTIN 2.4 (L) 12/22/2016   PROLACTIN 8.0 09/09/2015   Lab Results  Component Value Date   CHOL 130 12/22/2016   TRIG 193 (H) 12/22/2016   HDL 35 (L) 12/22/2016   CHOLHDL 3.7 12/22/2016   VLDL 39 12/22/2016   LDLCALC 56 12/22/2016   LDLCALC 84 09/09/2015    Physical Findings: AIMS: Facial and Oral Movements Muscles of Facial Expression: None, normal Lips and Perioral Area: None, normal Jaw: None, normal Tongue: None, normal,Extremity Movements Upper (arms, wrists, hands, fingers): None, normal Lower (legs, knees, ankles, toes): None, normal, Trunk Movements Neck, shoulders, hips: None, normal, Overall Severity Severity of abnormal movements (highest score from questions above): None, normal Incapacitation due to abnormal movements: None, normal Patient's awareness of abnormal movements (rate only patient's report): No Awareness, Dental Status Current problems with teeth and/or dentures?: No Does patient usually wear  dentures?: No  CIWA:    COWS:     Musculoskeletal: Strength & Muscle Tone: within normal limits Gait & Station: normal Patient leans: N/A  Psychiatric Specialty Exam: Physical Exam  Nursing note and vitals reviewed.   Review of Systems  Psychiatric/Behavioral: Positive for depression and suicidal ideas. The patient is nervous/anxious and has insomnia.   All other systems reviewed and are negative. denies chest pain, denies shortness of breath, no vomiting   Blood pressure (!) 109/44, pulse 98, temperature 97.5 F (36.4 C), temperature source Oral, resp. rate 20, height '5\' 3"'  (1.6 m), weight 107 kg (236 lb), SpO2 100 %.Body mass index is 41.81 kg/m.  General Appearance: Fairly Groomed  Eye Contact:  Good  Speech:  Normal Rate  Volume:  Decreased  Mood:  depressed   Affect:  constricted, but reactive, briefly tearful when discussing death of friend, as  above   Thought Process:  Linear and Descriptions of Associations: Intact  Orientation:  Other:  fully alert and attentive   Thought Content:  describes auditory hallucinations, does not currently present internally preoccupied   Suicidal Thoughts:  No, denies plan or intention of suicide or of hurting self, contracts for safety on unit, endorses passive SI at times.   Homicidal Thoughts:  No  Memory:  Recent and remote grossly intact   Judgement:  Fair  Insight:  Fair  Psychomotor Activity:  Normal  Concentration:  Concentration: Good and Attention Span: Good  Recall:  Good  Fund of Knowledge:  Good  Language:  Good  Akathisia:  Negative  Handed:  Right  AIMS (if indicated):     Assets:  Desire for Improvement Physical Health Resilience  ADL's:  Intact  Cognition:  WNL  Sleep:  Number of Hours: 4.25   Assessment:  patient presents with some improvement , but continues to report depression, grief , and auditory hallucinations. Does not appear internally preoccupied or grossly thought disordered. He is endorsing some  intermittent passive SI, but denying active SI , and contracting for safety on unit . Tolerating Li and Abilify well .   Plan:  Reviewed as below today 12/24/2016  Encourage milieu and group participation to work on coping skills and symptom reduction Will continue Abilify 10 mg po qhs for psychosis. Will continue LiCO3  300 mg QAM and - 600 mg QPM for mood disorder  Will continue Trazodone 50 mg QHS PRN  for insomnia. Will continue Vistaril 25 mgrs Q 8 hours PRN for anxiety  Treatment team working on disposition planning options    Janett Labella, NP [facility] 12/24/2016, 2:35 PM  Agree with NP progress note

## 2016-12-24 NOTE — BHH Group Notes (Signed)
[place] LCSW Group Therapy  12/24/2016  1:05 PM  Type of Therapy:  Group therapy  Participation Level:  Active  Participation Quality:  Attentive  Affect:  Flat  Cognitive:  Oriented  Insight:  Limited  Engagement in Therapy:  Limited  Modes of Intervention:  Discussion, Socialization  Summary of Progress/Problems:  Chaplain was here to lead a group on themes of hope and courage. "Care is having a connection with someone-it's a kind of energy.  Everything is based off of energy." Identifed CSW as someone who gives care.  But then mentioned family members. "It's different with family-with blood."  Looking forward to a visit with family later today.  "It's OK to care for others, but don't be used.  Sometimes you can't loan other people money."  Commented several times that this was good group today. Roque Lias B 12/24/2016 1:28 PM

## 2016-12-24 NOTE — Plan of Care (Signed)
Problem: Health Behavior/Discharge Planning: Goal: Compliance with treatment plan for underlying cause of condition will improve Outcome: Progressing Patient taking medications, working on coping skills and attending groups.

## 2016-12-24 NOTE — Progress Notes (Signed)
Recreation Therapy Notes  Date: 12/24/16 Time: 1000 Location: 500 Hall Dayroom  Group Topic: Communication, Team Building, Problem Solving  Goal Area(s) Addresses:  Patient will effectively work with peer towards shared goal.  Patient will identify skill used to make activity successful.  Patient will identify how skills used during activity can be used to reach post d/c goals.   Behavioral Response: Engaged  Intervention: STEM Activity   Activity: Metallurgist. In teams, patients were asked to build the tallest freestanding tower possible out of 15 pipe cleaners. Systematically resources were removed, for example patient ability to use both hands and patient ability to verbally communicate.    Education: Education officer, community, Dentist.   Education Outcome: Acknowledges education/In group clarification offered/Needs additional education.   Clinical Observations/Feedback: Pt was engaged during group.  Pt also showed some frustration for not being able to talk or use one hand during activity.  Pt stated they had to use creativity to complete the activity.  Pt was appropriate throughout group.   Victorino Sparrow, LRT/CTRS      Victorino Sparrow A 12/24/2016 12:13 PM

## 2016-12-24 NOTE — Progress Notes (Signed)
DAR NOTE: Patient presents with anxious affect and depressed mood.  Reports auditory hallucination and suicidal thoughts but contracts for safety.  Described energy level as normal and concentration as good.  Rates depression at 4, hopelessness at 2, and anxiety at 0.  Maintained on routine safety checks.  Medications given as prescribed.  Support and encouragement offered as needed.  Attended group and participated.  States goal for today is "to get better."  Patient observed socializing with peers in the dayroom.  Vistaril 25 mg given for complain of anxiety with good effect.

## 2016-12-24 NOTE — Progress Notes (Signed)
Nursing Progress Note [id number]  D) Patient presents pleasant, calm and cooperative on the unit. Patient attended karaoke this evening. Patient had a bright affect. Patient is seen interactive in the milieu. Patient denies SI/HI or pain but endorses aduitory hallucinations. Patient contracts for safety on the unit.  A)  Emotional support given. 1:1 interaction and active listening provided. Patient medicated as prescribed. Medications and plan of care reviewed with patient. Patient verbalized understanding without further questions.  Snacks and fluids provided. Opportunities for questions or concerns presented to patient. Patient encouraged to continue to work on treatment goals. Labs, vital signs and patient behavior monitored throughout shift. Patient safety maintained with q15 min safety checks. Low fall risk precautions in place and reviewed with patient; patient verbalized understanding.  R) Patient receptive to interaction with nurse. Patient remains safe on the unit at this time. Patient denies any adverse medication reactions at this time. Patient is resting in bed without complaints. Will continue to monitor.

## 2016-12-25 MED ORDER — ARIPIPRAZOLE 15 MG PO TABS
15.0000 mg | ORAL_TABLET | Freq: Every day | ORAL | Status: DC
  Administered 2016-12-25 – 2016-12-26 (×2): 15 mg via ORAL
  Filled 2016-12-25 (×5): qty 1

## 2016-12-25 NOTE — Progress Notes (Signed)
[facility] MD Progress Note  12/25/2016 1:06 PM Justin Chaney  MRN:  [id number] Subjective:  Patient was less communicative this morning.  He states he would rather not talk about it.    Objective: I have discussed case with staff and have met with patient. Justin Chaney has been to [facility] with several previous admissions, history of schizoaffective disorder and substance abuse.  He is guarded today more than yesterday.  However, he remains pleasant on approach.  He states that he feels that his moods are still erratic.    Principal Problem: Schizoaffective disorder, bipolar type ([place]) Diagnosis:   Patient Active Problem List   Diagnosis Date Noted  . Schizoaffective disorder, bipolar type ([place]) [F25.0] 09/04/2015    Priority: High  . Cocaine use disorder, severe, in early remission ([place]) [F14.21] 12/21/2016  . 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 ADHD (attention deficit hyperactivity disorder) [F90.9] 12/21/2016  . 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 Tobacco use disorder [F17.200] 12/21/2016  . Tobacco use [Z72.0] 10/06/2016  . Homicidal ideations [R45.850] 09/16/2016  . Suicidal ideation [R45.851]   . Hallucinations [R44.3]   . Homicidal ideation [R45.850]   . Suicidal thoughts [R45.851]   . Cannabis use disorder, moderate, in sustained remission ([place]) [F12.21] 11/07/2015  . Depression [F32.9] 10/29/2015  . Intermittent explosive disorder [F63.81] 09/05/2015  . Malingering [Z76.5] 06/05/2015  . Antisocial personality disorder [F60.2] 06/03/2015  . Cocaine abuse [F14.10] 06/03/2015   Total Time spent with patient: 20 minutes   Past Psychiatric History: Please see H&P.   Past Medical History:  Past Medical History:  Diagnosis Date  . Asthma   . Bipolar disorder ([place])   . 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 Depression   . Homelessness   . Schizophrenia ([place])   . Seizures ([place])    childhood seizures    Past Surgical History:  Procedure  Laterality Date  . TONSILLECTOMY    . VENTRICULO-PERITONEAL SHUNT PLACEMENT / LAPAROSCOPIC INSERTION PERITONEAL CATHETER     Family History:  Family History  Problem Relation Age of Onset  . Hypertension Mother   . Mental illness Neg Hx    Family Psychiatric  History: Please see H&P.  Social History: Please see H&P.  History  Alcohol Use No     History  Drug Use  . Types: Marijuana, Other-see comments, Amphetamines    Comment: Adderall, "I don't do weed, only rarely"    Social History   Social History  . Marital status: Single    Spouse name: N/A  . Number of children: N/A  . Years of education: N/A   Social History Main Topics  . Smoking status: Former Smoker    Packs/day: 0.50    Types: Cigarettes    Quit date: 10/22/2015  . Smokeless tobacco: Never Used  . Alcohol use No  . Drug use: Yes    Types: Marijuana, Other-see comments, Amphetamines     Comment: Adderall, "I don't do weed, only rarely"  . Sexual activity: Not Asked   Other Topics Concern  . None   Social History Narrative  . None   Additional Social History:    Pain Medications: None Prescriptions: Abilify and Lithium Over the Counter: None History of alcohol / drug use?: No history of alcohol / drug abuse Longest period of sobriety (when/how long): unknown  Sleep: Fair  Appetite:  Fair  Current Medications: Current Facility-Administered Medications  Medication Dose Route Frequency Provider Last Rate Last Dose  . alum &  mag hydroxide-simeth (MAALOX/MYLANTA) 200-200-20 MG/5ML suspension 30 mL  30 mL Oral Q4H PRN Okonkwo, Justina A, NP   30 mL at 12/23/16 0332  . ARIPiprazole (ABILIFY) tablet 15 mg  15 mg Oral QHS Kerrie Buffalo, NP      . asenapine (SAPHRIS) sublingual tablet 5 mg  5 mg Sublingual BID PRN Eappen, Ria Clock, MD      . hydrOXYzine (ATARAX/VISTARIL) tablet 25 mg  25 mg Oral TID PRN Lu Duffel, Justina A, NP   25 mg at 12/24/16 2109  . lithium carbonate capsule 300 mg  300 mg Oral  Daily Eappen, Ria Clock, MD   300 mg at 12/25/16 0809  . lithium carbonate capsule 600 mg  600 mg Oral Q supper Ursula Alert, MD   600 mg at 12/24/16 1637  . magnesium hydroxide (MILK OF MAGNESIA) suspension 30 mL  30 mL Oral Daily PRN Okonkwo, Justina A, NP      . nicotine (NICODERM CQ - dosed in mg/24 hours) patch 21 mg  21 mg Transdermal Daily Okonkwo, Justina A, NP   21 mg at 12/25/16 0809  . traZODone (DESYREL) tablet 50 mg  50 mg Oral QHS PRN Lu Duffel, Justina A, NP   50 mg at 12/24/16 2109    Lab Results:  No results found for this or any previous visit (from the past 48 hour(s)).  Blood Alcohol level:  Lab Results  Component Value Date   ETH <5 12/19/2016   ETH <5 16/60/6301    Metabolic Disorder Labs: Lab Results  Component Value Date   HGBA1C 5.0 12/22/2016   MPG 97 12/22/2016   MPG 97 09/09/2015   Lab Results  Component Value Date   PROLACTIN 2.4 (L) 12/22/2016   PROLACTIN 8.0 09/09/2015   Lab Results  Component Value Date   CHOL 130 12/22/2016   TRIG 193 (H) 12/22/2016   HDL 35 (L) 12/22/2016   CHOLHDL 3.7 12/22/2016   VLDL 39 12/22/2016   LDLCALC 56 12/22/2016   LDLCALC 84 09/09/2015    Physical Findings: AIMS: Facial and Oral Movements Muscles of Facial Expression: None, normal Lips and Perioral Area: None, normal Jaw: None, normal Tongue: None, normal,Extremity Movements Upper (arms, wrists, hands, fingers): None, normal Lower (legs, knees, ankles, toes): None, normal, Trunk Movements Neck, shoulders, hips: None, normal, Overall Severity Severity of abnormal movements (highest score from questions above): None, normal Incapacitation due to abnormal movements: None, normal Patient's awareness of abnormal movements (rate only patient's report): No Awareness, Dental Status Current problems with teeth and/or dentures?: No Does patient usually wear dentures?: No  CIWA:    COWS:     Musculoskeletal: Strength & Muscle Tone: within normal limits Gait  & Station: normal Patient leans: N/A  Psychiatric Specialty Exam: Physical Exam  Nursing note and vitals reviewed.   Review of Systems  Psychiatric/Behavioral: Positive for depression and suicidal ideas. The patient is nervous/anxious and has insomnia.   All other systems reviewed and are negative. denies chest pain, denies shortness of breath, no vomiting   Blood pressure (!) 101/59, pulse 98, temperature 98.1 F (36.7 C), temperature source Oral, resp. rate 20, height '5\' 3"'  (1.6 m), weight 107 kg (236 lb), SpO2 100 %.Body mass index is 41.81 kg/m.  General Appearance: Fairly Groomed  Eye Contact:  Good  Speech:  Normal Rate  Volume:  Decreased  Mood:  depressed   Affect:  constricted, but reactive, briefly tearful when discussing death of friend, as above   Thought Process:  Linear and Descriptions  of Associations: Intact  Orientation:  Other:  fully alert and attentive   Thought Content:  describes auditory hallucinations, does not currently present internally preoccupied   Suicidal Thoughts:  No, denies plan or intention of suicide or of hurting self, contracts for safety on unit, endorses passive SI at times.   Homicidal Thoughts:  No  Memory:  Recent and remote grossly intact   Judgement:  Fair  Insight:  Fair  Psychomotor Activity:  Normal  Concentration:  Concentration: Good and Attention Span: Good  Recall:  Good  Fund of Knowledge:  Good  Language:  Good  Akathisia:  Negative  Handed:  Right  AIMS (if indicated):     Assets:  Desire for Improvement Physical Health Resilience  ADL's:  Intact  Cognition:  WNL  Sleep:  Number of Hours: 6.25   Assessment:  patient presents with some improvement , but continues to report depression, grief , and auditory hallucinations. Does not appear internally preoccupied or grossly thought disordered. He is endorsing some intermittent passive SI, but denying active SI , and contracting for safety on unit . Tolerating Li and Abilify  well .   Plan:  Reviewed as below today 12/25/2016  Encourage milieu and group participation to work on coping skills and symptom reduction Increase Abilify to 15 mg po qhs for psychosis. Will continue LiCO3  300 mg QAM and - 600 mg QPM for mood disorder. Level 0.51 Will continue Trazodone 50 mg QHS PRN  for insomnia. Will continue Vistaril 25 mgrs Q 8 hours PRN for anxiety  Treatment team working on disposition planning options   Janett Labella, NP [facility] 12/25/2016, 1:06 PM

## 2016-12-25 NOTE — BHH Group Notes (Signed)
[place] Group Notes:  (Clinical Social Work)  12/25/2016  11:15-12:00PM  Summary of Progress/Problems:   Today's process group involved patients discussing their feelings related to being hospitalized, as well as what positive coping techniques they plan to use in order to stay out of the hospital and at home.  The patient expressed a primary feeling about being hospitalized is relief to be safe and have therapists to talk to, to allow his emotions to come out.  He stated to stay out of the hospital he needs to take his medications and find more positive activities.  Type of Therapy:  Group Therapy - Process  Participation Level:  Active  Participation Quality:  Appropriate and Attentive  Affect:  Blunted  Cognitive:  Appropriate  Insight:  Developing/Improving  Engagement in Therapy:  Engaged  Modes of Intervention:  Exploration, Discussion  Selmer Dominion, LCSW 12/25/2016, 12:51 PM

## 2016-12-25 NOTE — Progress Notes (Signed)
Adult Psychoeducational Group Note  Date:  12/25/2016 Time:  8:51 PM  Group Topic/Focus:  Wrap-Up Group:   The focus of this group is to help patients review their daily goal of treatment and discuss progress on daily workbooks.  Participation Level:  Active  Participation Quality:  Appropriate  Affect:  Appropriate  Cognitive:  Appropriate  Insight: Appropriate  Engagement in Group:  Engaged  Modes of Intervention:  Discussion  Additional Comments:   Nash Shearer 12/25/2016, 8:51 PM

## 2016-12-25 NOTE — Progress Notes (Signed)
Patient ID: Justin Chaney, male   DOB: June 27, 1996, 21 y.o.   MRN: [id number]   D: Pt has been appropriate on the unit today, he reported that he feels much better this admission than he did the last admission. Pt reported that his last admissions he was on drugs, and now he is clean. Pt reported that his depression was a 3, his hopelessness was a 1, and his anxiety was a 1. Pt reported that his goal for today was to get better. Pt reported being negative SI/HI, no AH/VH noted. A: 15 min checks continued for patient safety. R: Pt safety maintained.

## 2016-12-25 NOTE — Progress Notes (Signed)
D: Pt flat, isolative withdrawn to self even while in the dayroom. Pt at the time of assessment endorses moderate anxiety and command auditory hallucinations; states, "the some still want me to hurt myself."-Pt contracts for safety.  Pt denies depression, pain, SI and HI.  A: Medications offered as prescribed.  All patient's questions and concerns addressed. Support, encouragement, and safe environment provided.  15-minute safety checks continue. R: Pt was med compliant. Pt attended wrap-up group. Safety checks continue.

## 2016-12-26 LAB — LITHIUM LEVEL: Lithium Lvl: 0.19 mmol/L — ABNORMAL LOW (ref 0.60–1.20)

## 2016-12-26 NOTE — [facility] Group Notes (Signed)
[facility] Group Notes:  (Clinical Social Work)  12/26/2016  10:00-11:00AM  Summary of Progress/Problems:  The main focus of today's process group was to listen to a variety of genres of music and to identify that different types of music provoke different responses.  The patient then was able to identify personally what was soothing for them, as well as energizing, as well as how patient can personally use this knowledge in sleep habits, with depression, and with other symptoms.  The patient expressed at the beginning of group the overall feeling of "Happy" and did give some insightful answers about various music made him feel.  He could not stop talking to other people, however, even within seconds of having been asked not to do so.  Type of Therapy:  Music Therapy   Participation Level:  Active  Participation Quality:  Attentive and Sharing and Distracting  Affect:  Blunted  Cognitive:  Disorganized  Insight:  Improving  Engagement in Therapy:  Limited  Modes of Intervention:   Activity, Exploration  Selmer Dominion, LCSW 12/26/2016

## 2016-12-26 NOTE — Progress Notes (Signed)
Patient ID: Justin Chaney, male   DOB: August 15, 1995, 21 y.o.   MRN: [id number]   D: Pt has been appropriate on the unit today, he has attended all groups and engaged in treatment. Pt reported that his depression was a 7, his hopelessness was a 0, and his anxiety was a 2. Pt reported that his goal for today was to go to group. Pt reported being negative SI/HI, no AH/VH noted. A: 15 min checks continued for patient safety. R: Pt safety maintained.

## 2016-12-26 NOTE — Progress Notes (Signed)
[facility] MD Progress Note  12/26/2016 11:03 AM Justin Chaney  MRN:  [id number] Subjective:  Patient states that he is ok. Objective: I have discussed case with staff and have met with patient. Justin Chaney has been to [facility] with several previous admissions, history of schizoaffective disorder and substance abuse.  He is less guarded today more than yesterday.  However, he remains pleasant on approach.    Principal Problem: Schizoaffective disorder, bipolar type ([place]) Diagnosis:   Patient Active Problem List   Diagnosis Date Noted  . Schizoaffective disorder, bipolar type ([place]) [F25.0] 09/04/2015    Priority: High  . Cocaine use disorder, severe, in early remission ([place]) [F14.21] 12/21/2016  . 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 ADHD (attention deficit hyperactivity disorder) [F90.9] 12/21/2016  . 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 Tobacco use disorder [F17.200] 12/21/2016  . Tobacco use [Z72.0] 10/06/2016  . Homicidal ideations [R45.850] 09/16/2016  . Suicidal ideation [R45.851]   . Hallucinations [R44.3]   . Homicidal ideation [R45.850]   . Suicidal thoughts [R45.851]   . Cannabis use disorder, moderate, in sustained remission ([place]) [F12.21] 11/07/2015  . Depression [F32.9] 10/29/2015  . Intermittent explosive disorder [F63.81] 09/05/2015  . Malingering [Z76.5] 06/05/2015  . Antisocial personality disorder [F60.2] 06/03/2015  . Cocaine abuse [F14.10] 06/03/2015   Total Time spent with patient: 20 minutes   Past Psychiatric History: Please see H&P.   Past Medical History:  Past Medical History:  Diagnosis Date  . Asthma   . Bipolar disorder ([place])   . 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 Depression   . Homelessness   . Schizophrenia ([place])   . Seizures ([place])    childhood seizures    Past Surgical History:  Procedure Laterality Date  . TONSILLECTOMY    . VENTRICULO-PERITONEAL SHUNT PLACEMENT / LAPAROSCOPIC INSERTION PERITONEAL  CATHETER     Family History:  Family History  Problem Relation Age of Onset  . Hypertension Mother   . Mental illness Neg Hx    Family Psychiatric  History: Please see H&P.  Social History: Please see H&P.  History  Alcohol Use No     History  Drug Use  . Types: Marijuana, Other-see comments, Amphetamines    Comment: Adderall, "I don't do weed, only rarely"    Social History   Social History  . Marital status: Single    Spouse name: N/A  . Number of children: N/A  . Years of education: N/A   Social History Main Topics  . Smoking status: Former Smoker    Packs/day: 0.50    Types: Cigarettes    Quit date: 10/22/2015  . Smokeless tobacco: Never Used  . Alcohol use No  . Drug use: Yes    Types: Marijuana, Other-see comments, Amphetamines     Comment: Adderall, "I don't do weed, only rarely"  . Sexual activity: Not Asked   Other Topics Concern  . None   Social History Narrative  . None   Additional Social History:    Pain Medications: None Prescriptions: Abilify and Lithium Over the Counter: None History of alcohol / drug use?: No history of alcohol / drug abuse Longest period of sobriety (when/how long): unknown  Sleep: Fair  Appetite:  Fair  Current Medications: Current Facility-Administered Medications  Medication Dose Route Frequency Provider Last Rate Last Dose  . alum & mag hydroxide-simeth (MAALOX/MYLANTA) 200-200-20 MG/5ML suspension 30 mL  30 mL Oral Q4H PRN Okonkwo, Justina A, NP   30 mL at  12/23/16 0332  . ARIPiprazole (ABILIFY) tablet 15 mg  15 mg Oral QHS Kerrie Buffalo, NP   15 mg at 12/25/16 2104  . asenapine (SAPHRIS) sublingual tablet 5 mg  5 mg Sublingual BID PRN Ursula Alert, MD      . hydrOXYzine (ATARAX/VISTARIL) tablet 25 mg  25 mg Oral TID PRN Lu Duffel, Justina A, NP   25 mg at 12/25/16 2007  . lithium carbonate capsule 300 mg  300 mg Oral Daily Eappen, Saramma, MD   300 mg at 12/26/16 0830  . lithium carbonate capsule 600 mg  600  mg Oral Q supper Ursula Alert, MD   600 mg at 12/25/16 1657  . magnesium hydroxide (MILK OF MAGNESIA) suspension 30 mL  30 mL Oral Daily PRN Okonkwo, Justina A, NP      . nicotine (NICODERM CQ - dosed in mg/24 hours) patch 21 mg  21 mg Transdermal Daily Okonkwo, Justina A, NP   21 mg at 12/26/16 0830  . traZODone (DESYREL) tablet 50 mg  50 mg Oral QHS PRN Lu Duffel, Justina A, NP   50 mg at 12/25/16 2104    Lab Results:  Results for orders placed or performed during the hospital encounter of 12/20/16 (from the past 48 hour(s))  Lithium level     Status: Abnormal   Collection Time: 12/26/16  6:06 AM  Result Value Ref Range   Lithium Lvl 0.19 (L) 0.60 - 1.20 mmol/L    Comment: Performed at [facility], [place] [address] [place], [place] [address]    Blood Alcohol level:  Lab Results  Component Value Date   [facility] <5 12/19/2016   ETH <5 38/17/7116    Metabolic Disorder Labs: Lab Results  Component Value Date   HGBA1C 5.0 12/22/2016   MPG 97 12/22/2016   MPG 97 09/09/2015   Lab Results  Component Value Date   PROLACTIN 2.4 (L) 12/22/2016   PROLACTIN 8.0 09/09/2015   Lab Results  Component Value Date   CHOL 130 12/22/2016   TRIG 193 (H) 12/22/2016   HDL 35 (L) 12/22/2016   CHOLHDL 3.7 12/22/2016   VLDL 39 12/22/2016   LDLCALC 56 12/22/2016   LDLCALC 84 09/09/2015    Physical Findings: AIMS: Facial and Oral Movements Muscles of Facial Expression: None, normal Lips and Perioral Area: None, normal Jaw: None, normal Tongue: None, normal,Extremity Movements Upper (arms, wrists, hands, fingers): None, normal Lower (legs, knees, ankles, toes): None, normal, Trunk Movements Neck, shoulders, hips: None, normal, Overall Severity Severity of abnormal movements (highest score from questions above): None, normal Incapacitation due to abnormal movements: None, normal Patient's awareness of abnormal movements (rate only patient's report): No Awareness, Dental  Status Current problems with teeth and/or dentures?: No Does patient usually wear dentures?: No  CIWA:    COWS:     Musculoskeletal: Strength & Muscle Tone: within normal limits Gait & Station: normal Patient leans: N/A  Psychiatric Specialty Exam: Physical Exam  Nursing note and vitals reviewed.   Review of Systems  Psychiatric/Behavioral: Positive for depression and suicidal ideas. The patient is nervous/anxious and has insomnia.   All other systems reviewed and are negative. denies chest pain, denies shortness of breath, no vomiting   Blood pressure (!) 106/54, pulse 83, temperature 97.7 F (36.5 C), temperature source Oral, resp. rate 16, height 5' 3" (1.6 m), weight 107 kg (236 lb), SpO2 100 %.Body mass index is 41.81 kg/m.  General Appearance: Fairly Groomed  Eye Contact:  Good  Speech:  Normal Rate  Volume:  Decreased  Mood:  depressed   Affect:  constricted, but reactive, briefly tearful when discussing death of friend, as above   Thought Process:  Linear and Descriptions of Associations: Intact  Orientation:  Other:  fully alert and attentive   Thought Content:  describes auditory hallucinations, does not currently present internally preoccupied   Suicidal Thoughts:  No, denies plan or intention of suicide or of hurting self, contracts for safety on unit, endorses passive SI at times.   Homicidal Thoughts:  No  Memory:  Recent and remote grossly intact   Judgement:  Fair  Insight:  Fair  Psychomotor Activity:  Normal  Concentration:  Concentration: Good and Attention Span: Good  Recall:  Good  Fund of Knowledge:  Good  Language:  Good  Akathisia:  Negative  Handed:  Right  AIMS (if indicated):     Assets:  Desire for Improvement Physical Health Resilience  ADL's:  Intact  Cognition:  WNL  Sleep:  Number of Hours: 5.5   Assessment:  patient presents with some improvement , but continues to report depression, grief , and auditory hallucinations. Does not  appear internally preoccupied or grossly thought disordered. He is endorsing some intermittent passive SI, but denying active SI , and contracting for safety on unit . Tolerating Li and Abilify well .   Plan:  Reviewed as below today 12/26/2016  Encourage milieu and group participation to work on coping skills and symptom reduction Increase Abilify to 15 mg po qhs for psychosis. Will continue LiCO3  300 mg QAM and - 600 mg QPM for mood disorder. Level 0.51 Will continue Trazodone 50 mg QHS PRN  for insomnia. Will continue Vistaril 25 mgrs Q 8 hours PRN for anxiety  Treatment team working on disposition planning options   Janett Labella, NP [facility] 12/26/2016, 11:03 AM

## 2016-12-26 NOTE — Progress Notes (Signed)
Pt attend wrap up group. His day was a 9. His goal was to take his medication to get better.

## 2016-12-26 NOTE — Progress Notes (Signed)
Patient ID: Justin Chaney, male   DOB: Oct 28, 1995, 21 y.o.   MRN: [id number]  D: Patient observed watching TV and interacting well with peers on approach. Denies  SI/HI/AVH and pain. pt stated he is doing well and tolerating medication. No behavioral issues noted.  A: Support and encouragement offered as needed. Medications administered as prescribed.  R: Patient safe and cooperative on the unit. Pt attended evening wrap up group.

## 2016-12-26 NOTE — Progress Notes (Signed)
D: Pt flat, isolative withdrawn to self even while in the dayroom. Pt at the time of assessment was paranoid, endorsed moderate anxiety, and visual and auditory hallucinations. Pt inspected one of his medications for a while before taking it; when asked about his reason for the inspection he stated, "I thought I saw a face on it." Pt denies depression, pain, SI and HI; "the voices are there but they are not asking me to hart myself; infact, I can hardly understand what they are saying today."  A: Medications offered as prescribed.  All patient's questions and concerns addressed. Support, encouragement, and safe environment provided.  15-minute safety checks continue. R: Pt was med compliant. Pt attended wrap-up group. Safety checks continue.

## 2016-12-27 MED ORDER — LITHIUM CARBONATE 300 MG PO CAPS
600.0000 mg | ORAL_CAPSULE | Freq: Every day | ORAL | Status: DC
  Administered 2016-12-28 – 2016-12-30 (×3): 600 mg via ORAL
  Filled 2016-12-27 (×5): qty 2

## 2016-12-27 MED ORDER — ARIPIPRAZOLE 10 MG PO TABS
20.0000 mg | ORAL_TABLET | Freq: Every day | ORAL | Status: DC
  Administered 2016-12-27: 20 mg via ORAL
  Filled 2016-12-27 (×3): qty 2

## 2016-12-27 NOTE — Progress Notes (Signed)
D: Pt denied SI to this writer but admitted AVH in the a.m. He has been active in the milieu, no RIS immediately evident. He is calm, cooperative, and compliant with medications.  A: Meds given as ordered. Monitored evening meds more closely for cheeking -- none evident. Q15 safety checks maintained. Support/encouragement offered.  R: Pt remains free from harm and continues with treatment. Will continue to monitor for needs/safety.

## 2016-12-27 NOTE — Plan of Care (Signed)
Problem: Medication: Goal: Compliance with prescribed medication regimen will improve Outcome: Progressing Pt compliant with medication regime this shift.

## 2016-12-27 NOTE — Tx Team (Signed)
Interdisciplinary Treatment and Diagnostic Plan Update  12/27/2016 Time of Session: 11:40 AM  Justin Chaney MRN: [id number]  Principal Diagnosis: Schizoaffective disorder, bipolar type ([place])  Secondary Diagnoses: Principal Problem:   Schizoaffective disorder, bipolar type ([place]) Active Problems:   Intermittent explosive disorder   Cannabis use disorder, moderate, in sustained remission (HCC)   Cocaine use disorder, severe, in early remission ([place])   Amphetamine abuse in remission   Tobacco use disorder   Current Medications:  Current Facility-Administered Medications  Medication Dose Route Frequency Provider Last Rate Last Dose  . alum & mag hydroxide-simeth (MAALOX/MYLANTA) 200-200-20 MG/5ML suspension 30 mL  30 mL Oral Q4H PRN Okonkwo, Justina A, NP   30 mL at 12/23/16 0332  . ARIPiprazole (ABILIFY) tablet 15 mg  15 mg Oral QHS Kerrie Buffalo, NP   15 mg at 12/26/16 2105  . asenapine (SAPHRIS) sublingual tablet 5 mg  5 mg Sublingual BID PRN Ursula Alert, MD      . hydrOXYzine (ATARAX/VISTARIL) tablet 25 mg  25 mg Oral TID PRN Lu Duffel, Justina A, NP   25 mg at 12/26/16 2031  . lithium carbonate capsule 300 mg  300 mg Oral Daily Eappen, Ria Clock, MD   300 mg at 12/27/16 5456  . lithium carbonate capsule 600 mg  600 mg Oral Q supper Ursula Alert, MD   600 mg at 12/26/16 1614  . magnesium hydroxide (MILK OF MAGNESIA) suspension 30 mL  30 mL Oral Daily PRN Okonkwo, Justina A, NP   30 mL at 12/26/16 1127  . nicotine (NICODERM CQ - dosed in mg/24 hours) patch 21 mg  21 mg Transdermal Daily Okonkwo, Justina A, NP   21 mg at 12/27/16 0824  . traZODone (DESYREL) tablet 50 mg  50 mg Oral QHS PRN Lu Duffel, Justina A, NP   50 mg at 12/26/16 2105    PTA Medications: Prescriptions Prior to Admission  Medication Sig Dispense Refill Last Dose  . ARIPiprazole (ABILIFY) 10 MG tablet Take 10 mg by mouth daily.   12/19/2016 at Unknown time  . carbamazepine (TEGRETOL) 200 MG tablet Take 1 tablet  (200 mg total) by mouth 2 (two) times daily after a meal. (Patient not taking: Reported on 10/02/2016) 60 tablet 0 Not Taking at Unknown time  . lithium carbonate 300 MG capsule Take 300-600 mg by mouth See admin instructions. Take 1 capsule every morning and take 2 capsules at night   12/19/2016 at Unknown time  . OLANZapine zydis (ZYPREXA) 10 MG disintegrating tablet Take 1 tablet (10 mg total) by mouth at bedtime. (Patient not taking: Reported on 10/02/2016) 30 tablet 0 Not Taking at Unknown time    Treatment Modalities: Medication Management, Group therapy, Case management,  1 to 1 session with clinician, Psychoeducation, Recreational therapy.   Physician Treatment Plan for Primary Diagnosis: Schizoaffective disorder, bipolar type ([place]) Long Term Goal(s): Improvement in symptoms so as ready for discharge  Short Term Goals: Ability to verbalize feelings will improve Compliance with prescribed medications will improve   Medication Management: Evaluate patient's response, side effects, and tolerance of medication regimen.  Therapeutic Interventions: 1 to 1 sessions, Unit Group sessions and Medication administration.  Evaluation of Outcomes: Progressing   5/28:  Patient with continued command AH to kill people, possibility of cheeking his medications, will monitor closely.  Will increase Abilify to 20 mg PO qhs for psychosis/mood sx. Will increase Li to 600 mg PO daily and 600 mg PO qpm for mood lability. Li level reviewed- Results for [employer] (  MRN [id number]) as of 12/27/2016 13:43  Ref. Range 12/26/2016 06:06  Lithium Latest Ref Range: 0.60 - 1.20 mmol/L 0.19 (L)  His Li level went down inspite of being on it for several days - on admission was higher.  Will continue Trazodone 50 mg QHS PRN  for insomnia. Will continue Vistaril 25 mgrs Q 8 hours PRN for anxiety    Physician Treatment Plan for Secondary Diagnosis: Principal Problem:   Schizoaffective disorder, bipolar type  ([place]) Active Problems:   Intermittent explosive disorder   Cannabis use disorder, moderate, in sustained remission (HCC)   Cocaine use disorder, severe, in early remission (HCC)   Amphetamine abuse in remission   Tobacco use disorder   Long Term Goal(s): Improvement in symptoms so as ready for discharge  Short Term Goals: Ability to verbalize feelings will improve Compliance with prescribed medications will improve   Medication Management: Evaluate patient's response, side effects, and tolerance of medication regimen.  Therapeutic Interventions: 1 to 1 sessions, Unit Group sessions and Medication administration.  Evaluation of Outcomes: Progressing   RN Treatment Plan for Primary Diagnosis: Schizoaffective disorder, bipolar type ([place]) Long Term Goal(s): Knowledge of disease and therapeutic regimen to maintain health will improve  Short Term Goals: Ability to identify and develop effective coping behaviors will improve and Compliance with prescribed medications will improve  Medication Management: RN will administer medications as ordered by provider, will assess and evaluate patient's response and provide education to patient for prescribed medication. RN will report any adverse and/or side effects to prescribing provider.  Therapeutic Interventions: 1 on 1 counseling sessions, Psychoeducation, Medication administration, Evaluate responses to treatment, Monitor vital signs and CBGs as ordered, Perform/monitor CIWA, COWS, AIMS and Fall Risk screenings as ordered, Perform wound care treatments as ordered.  Evaluation of Outcomes: Progressing   LCSW Treatment Plan for Primary Diagnosis: Schizoaffective disorder, bipolar type ([place]) Long Term Goal(s): Safe transition to appropriate next level of care at discharge, Engage patient in therapeutic group addressing interpersonal concerns.  Short Term Goals: Engage patient in aftercare planning with referrals and resources  Therapeutic  Interventions: Assess for all discharge needs, 1 to 1 time with Social worker, Explore available resources and support systems, Assess for adequacy in community support network, Educate family and significant other(s) on suicide prevention, Complete Psychosocial Assessment, Interpersonal group therapy.  Evaluation of Outcomes: Met Return home, follow up [facility]   Progress in Treatment: Attending groups: Yes Participating in groups: Yes Taking medication as prescribed: Yes Toleration medication: Yes, no side effects reported at this time Family/Significant other contact made: No Patient understands diagnosis:No  Limited insight.  Apparently feels safe in the hospital based on number of recent past hospitalizations and ED visits Discussing patient identified problems/goals with staff: Yes Medical problems stabilized or resolved: Yes Denies suicidal/homicidal ideation: Yes Issues/concerns per patient self-inventory: None Other: N/A  New problem(s) identified: None identified at this time.   New Short Term/Long Term Goal(s): None identified at this time.   Discharge Plan or Barriers:   Reason for Continuation of Hospitalization:  Delusions  Depression Hallucinations Medication stabilization   Estimated Length of Stay: 3-5 days  Attendees: Patient: 12/27/2016  11:40 AM  Physician: Ursula Alert, MD 12/27/2016  11:40 AM  Nursing: Hoy Register, RN 12/27/2016  11:40 AM  RN Care Manager: Lars Pinks, RN 12/27/2016  11:40 AM  Social Worker: Ripley Fraise 12/27/2016  11:40 AM  Recreational Therapist: Laretta Bolster  12/27/2016  11:40 AM  Other: Norberto Sorenson 12/27/2016  11:40 AM  Other:  12/27/2016  11:40 AM    Scribe for Treatment Team:  Roque Lias LCSW 12/27/2016 11:40 AM

## 2016-12-27 NOTE — Progress Notes (Signed)
[facility] MD Progress Note  12/27/2016 1:45 PM Justin Chaney  MRN:  [id number] Subjective:  Patient states " I have thoughts about hurting other people , I hear voices asking me to do so.'  Objective: Patient seen and chart reviewed.Discussed patient with treatment team.  Pt today seen as more interactive in milieu. He however continues to have command AH asking him to kill other people. Pt reports SI as improved as well as denies sleep issues , appetite issues and is making progress in those areas. Discussed increasing his abilify and he agrees. Pt's Li level subtherapeutic inspite of being on 900 mg - unknown if he is cheeking it , will monitor closely. He states he is compliant. Continue treatment.    Principal Problem: Schizoaffective disorder, bipolar type (Justin Chaney) Diagnosis:   Patient Active Problem List   Diagnosis Date Noted  . Cocaine use disorder, severe, in early remission ([place]) [F14.21] 12/21/2016  . 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 ADHD (attention deficit hyperactivity disorder) [F90.9] 12/21/2016  . 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 Tobacco use disorder [F17.200] 12/21/2016  . Tobacco use [Z72.0] 10/06/2016  . Homicidal ideations [R45.850] 09/16/2016  . Suicidal ideation [R45.851]   . Hallucinations [R44.3]   . Homicidal ideation [R45.850]   . Suicidal thoughts [R45.851]   . Cannabis use disorder, moderate, in sustained remission ([place]) [F12.21] 11/07/2015  . Depression [F32.9] 10/29/2015  . Intermittent explosive disorder [F63.81] 09/05/2015  . Schizoaffective disorder, bipolar type ([place]) [F25.0] 09/04/2015  . Malingering [Z76.5] 06/05/2015  . Antisocial personality disorder [F60.2] 06/03/2015  . Cocaine abuse [F14.10] 06/03/2015   Total Time spent with patient: 25 minutes   Past Psychiatric History: Please see H&P.   Past Medical History:  Past Medical History:  Diagnosis Date  . Asthma   . Bipolar disorder ([place])   . Brain ventricular  shunt obstruction (HCC)    hydrochelpis  . Depression   . Homelessness   . Schizophrenia ([place])   . Seizures ([place])    childhood seizures    Past Surgical History:  Procedure Laterality Date  . TONSILLECTOMY    . VENTRICULO-PERITONEAL SHUNT PLACEMENT / LAPAROSCOPIC INSERTION PERITONEAL CATHETER     Family History:  Family History  Problem Relation Age of Onset  . Hypertension Mother   . Mental illness Neg Hx    Family Psychiatric  History: Please see H&P.  Social History: Please see H&P.  History  Alcohol Use No     History  Drug Use  . Types: Marijuana, Other-see comments, Amphetamines    Comment: Adderall, "I don't do weed, only rarely"    Social History   Social History  . Marital status: Single    Spouse name: N/A  . Number of children: N/A  . Years of education: N/A   Social History Main Topics  . Smoking status: Former Smoker    Packs/day: 0.50    Types: Cigarettes    Quit date: 10/22/2015  . Smokeless tobacco: Never Used  . Alcohol use No  . Drug use: Yes    Types: Marijuana, Other-see comments, Amphetamines     Comment: Adderall, "I don't do weed, only rarely"  . Sexual activity: Not Asked   Other Topics Concern  . None   Social History Narrative  . None   Additional Social History:    Pain Medications: None Prescriptions: Abilify and Lithium Over the Counter: None History of alcohol / drug use?: No history of alcohol / drug abuse Longest period of sobriety (  when/how long): unknown  Sleep: Fair  Appetite:  Fair  Current Medications: Current Facility-Administered Medications  Medication Dose Route Frequency Provider Last Rate Last Dose  . alum & mag hydroxide-simeth (MAALOX/MYLANTA) 200-200-20 MG/5ML suspension 30 mL  30 mL Oral Q4H PRN Okonkwo, Justina A, NP   30 mL at 12/23/16 0332  . ARIPiprazole (ABILIFY) tablet 20 mg  20 mg Oral QHS Tramaine Snell, MD      . asenapine (SAPHRIS) sublingual tablet 5 mg  5 mg Sublingual BID PRN Saryah Loper,  Jamieson Hetland, MD      . hydrOXYzine (ATARAX/VISTARIL) tablet 25 mg  25 mg Oral TID PRN Lu Duffel, Justina A, NP   25 mg at 12/26/16 2031  . lithium carbonate capsule 600 mg  600 mg Oral Q supper Ursula Alert, MD   600 mg at 12/26/16 1614  . [START ON 12/28/2016] lithium carbonate capsule 600 mg  600 mg Oral Daily Mahalie Kanner, MD      . magnesium hydroxide (MILK OF MAGNESIA) suspension 30 mL  30 mL Oral Daily PRN Okonkwo, Justina A, NP   30 mL at 12/26/16 1127  . nicotine (NICODERM CQ - dosed in mg/24 hours) patch 21 mg  21 mg Transdermal Daily Okonkwo, Justina A, NP   21 mg at 12/27/16 0824  . traZODone (DESYREL) tablet 50 mg  50 mg Oral QHS PRN Lu Duffel, Justina A, NP   50 mg at 12/26/16 2105    Lab Results:  Results for orders placed or performed during the hospital encounter of 12/20/16 (from the past 48 hour(s))  Lithium level     Status: Abnormal   Collection Time: 12/26/16  6:06 AM  Result Value Ref Range   Lithium Lvl 0.19 (L) 0.60 - 1.20 mmol/L    Comment: Performed at [facility], [place] [address] [place], [place] [address]    Blood Alcohol level:  Lab Results  Component Value Date   [facility] <5 12/19/2016   ETH <5 18/56/3149    Metabolic Disorder Labs: Lab Results  Component Value Date   HGBA1C 5.0 12/22/2016   MPG 97 12/22/2016   MPG 97 09/09/2015   Lab Results  Component Value Date   PROLACTIN 2.4 (L) 12/22/2016   PROLACTIN 8.0 09/09/2015   Lab Results  Component Value Date   CHOL 130 12/22/2016   TRIG 193 (H) 12/22/2016   HDL 35 (L) 12/22/2016   CHOLHDL 3.7 12/22/2016   VLDL 39 12/22/2016   LDLCALC 56 12/22/2016   LDLCALC 84 09/09/2015    Physical Findings: AIMS: Facial and Oral Movements Muscles of Facial Expression: None, normal Lips and Perioral Area: None, normal Jaw: None, normal Tongue: None, normal,Extremity Movements Upper (arms, wrists, hands, fingers): None, normal Lower (legs, knees, ankles, toes): None, normal, Trunk  Movements Neck, shoulders, hips: None, normal, Overall Severity Severity of abnormal movements (highest score from questions above): None, normal Incapacitation due to abnormal movements: None, normal Patient's awareness of abnormal movements (rate only patient's report): No Awareness, Dental Status Current problems with teeth and/or dentures?: No Does patient usually wear dentures?: No  CIWA:    COWS:     Musculoskeletal: Strength & Muscle Tone: within normal limits Gait & Station: normal Patient leans: N/A  Psychiatric Specialty Exam: Physical Exam  Nursing note and vitals reviewed.   Review of Systems  Psychiatric/Behavioral: Positive for hallucinations.  All other systems reviewed and are negative.   Blood pressure (!) 107/52, pulse (!) 104, temperature 98 F (36.7 C), temperature source Oral,  resp. rate 20, height 5\' 3"  (1.6 m), weight 107 kg (236 lb), SpO2 100 %.Body mass index is 41.81 kg/m.  General Appearance: Fairly Groomed  Eye Contact:  Fair  Speech:  Normal Rate  Volume:  Decreased  Mood:  Anxious and Dysphoric  Affect:  Congruent  Thought Process:  Goal Directed and Descriptions of Associations: Intact  Orientation:  Full (Time, Place, and Person)  Thought Content:  Hallucinations: Auditory Command:  kill others , contracts for safety and Rumination  Suicidal Thoughts:  No  Homicidal Thoughts:  denies but has AH asking him to kill others  Memory: Recent, remote, immediate - fair  Judgement:  Fair  Insight:  Fair  Psychomotor Activity:  Normal  Concentration:  Concentration: Fair and Attention Span: Fair  Recall:  [employer] of Knowledge:  Fair  Language:  Fair  Akathisia:  Negative  Handed:  Right  AIMS (if indicated):     Assets:  Desire for Improvement Physical Health Resilience  ADL's:  Intact  Cognition:  WNL  Sleep:  Number of Hours: 6   Will continue today 12/27/16  plan as below except where it is noted.    Assessment:  Patient with  continued command AH to kill people, possibility of cheeking his medications, will monitor closely.  Plan:  Encourage milieu and group participation to work on coping skills and symptom reduction Will increase Abilify to 20 mg PO qhs for psychosis/mood sx. Will increase Li to 600 mg PO daily and 600 mg PO qpm for mood lability. Li level reviewed- Results for Justin, Chaney (MRN [id number]) as of 12/27/2016 13:43  Ref. Range 12/26/2016 06:06  Lithium Latest Ref Range: 0.60 - 1.20 mmol/L 0.19 (L)  His Li level went down inspite of being on it for several days - on admission was higher.   Will continue Trazodone 50 mg QHS PRN  for insomnia. Will continue Vistaril 25 mgrs Q 8 hours PRN for anxiety  CSW will continue to work on disposition.  Jamika Sadek, MD  12/27/2016, 1:45 PM

## 2016-12-27 NOTE — Progress Notes (Signed)
Adult Psychoeducational Group Note  Date:  12/27/2016 Time:  9:56 PM  Group Topic/Focus:  Wrap-Up Group:   The focus of this group is to help patients review their daily goal of treatment and discuss progress on daily workbooks.  Participation Level:  Active  Participation Quality:  Appropriate and Attentive  Affect:  Appropriate  Cognitive:  Alert and Appropriate  Insight: Appropriate and Improving  Engagement in Group:  Engaged  Modes of Intervention:  Discussion  Additional Comments:  Patient stated his goal for today was to attend all groups and to talk with doctors about his medication. Patient felt magnificent when he achieved his goal today. Patient rated his day a 6 out of 10 today. Patient stated something positive that happened today was he overcame the voices in his head. Patient stated he would let tech know if this problem comes up tonight and he can not deal with it himself.  Candy Sledge 12/27/2016, 9:56 PM

## 2016-12-27 NOTE — Progress Notes (Signed)
Recreation Therapy Notes  Date: 12/27/16 Time: 1000 Location: 500 Hall Dayroom  Group Topic: Leisure Education  Goal Area(s) Addresses:  Patient will identify positive leisure activities.  Patient will identify one positive benefit of participation in leisure activities.   Behavioral Response: Engaged  Intervention: Dry erase marker, eraser, slips of paper with words  Activity: Pictionary.  Patients were divided into 2 teams.  Patients had to pull a word from the can and either draw it or act it out.  The team was given a minute to guess the picture.  If the team did not guess correctly, the other got the chance to steal the point.  If no one guessed the picture, no one got the point and the next team would go.  Education:  Leisure Education, Dentist  Education Outcome: Acknowledges education/In group clarification offered/Needs additional education  Clinical Observations/Feedback: Pt was bright and showed eagerness.  Pt was so eager at times that, he would try to go when it was the other teams turn.  Pt was active and supportive of peers and seemed to really enjoy group.   Victorino Sparrow, LRT/CTRS         Victorino Sparrow A 12/27/2016 12:22 PM

## 2016-12-27 NOTE — BHH Group Notes (Signed)
[facility] LCSW Aftercare Discharge Planning Group Note   12/27/2016 11:36 AM  Participation Quality:  Minimally engaged  Mood/Affect:  Flat and Grim  Depression Rating:    Anxiety Rating:    Thoughts of Suicide:  No Will you contract for safety?   NA  Current AVH:  Yes  Plan for Discharge/Comments:  "I'm going through some things."  Declined to clarify.  Big contrast from Friday when his mood was good and he was denying symptoms.  Transportation Means:   Supports:  [name]

## 2016-12-28 MED ORDER — ARIPIPRAZOLE 15 MG PO TABS
25.0000 mg | ORAL_TABLET | Freq: Every day | ORAL | Status: DC
  Administered 2016-12-28 – 2016-12-29 (×2): 25 mg via ORAL
  Filled 2016-12-28 (×5): qty 1

## 2016-12-28 NOTE — Tx Team (Signed)
Interdisciplinary Treatment and Diagnostic Plan Update  12/28/2016 Time of Session: 10:40 AM  Justin Chaney MRN: [id number]  Principal Diagnosis: Schizoaffective disorder, bipolar type ([place])  Secondary Diagnoses: Principal Problem:   Schizoaffective disorder, bipolar type ([place]) Active Problems:   Intermittent explosive disorder   Cannabis use disorder, moderate, in sustained remission (HCC)   Cocaine use disorder, severe, in early remission ([place])   Amphetamine abuse in remission   Tobacco use disorder   Current Medications:  Current Facility-Administered Medications  Medication Dose Route Frequency Provider Last Rate Last Dose  . alum & mag hydroxide-simeth (MAALOX/MYLANTA) 200-200-20 MG/5ML suspension 30 mL  30 mL Oral Q4H PRN Okonkwo, Justina A, NP   30 mL at 12/27/16 1718  . ARIPiprazole (ABILIFY) tablet 20 mg  20 mg Oral QHS Ursula Alert, MD   20 mg at 12/27/16 2039  . asenapine (SAPHRIS) sublingual tablet 5 mg  5 mg Sublingual BID PRN Eappen, Saramma, MD      . hydrOXYzine (ATARAX/VISTARIL) tablet 25 mg  25 mg Oral TID PRN Lu Duffel, Justina A, NP   25 mg at 12/26/16 2031  . lithium carbonate capsule 600 mg  600 mg Oral Q supper Ursula Alert, MD   600 mg at 12/27/16 1620  . lithium carbonate capsule 600 mg  600 mg Oral Daily Eappen, Ria Clock, MD   600 mg at 12/28/16 0813  . magnesium hydroxide (MILK OF MAGNESIA) suspension 30 mL  30 mL Oral Daily PRN Okonkwo, Justina A, NP   30 mL at 12/26/16 1127  . nicotine (NICODERM CQ - dosed in mg/24 hours) patch 21 mg  21 mg Transdermal Daily Okonkwo, Justina A, NP   21 mg at 12/28/16 0813  . traZODone (DESYREL) tablet 50 mg  50 mg Oral QHS PRN Lu Duffel, Justina A, NP   50 mg at 12/27/16 2038    PTA Medications: Prescriptions Prior to Admission  Medication Sig Dispense Refill Last Dose  . ARIPiprazole (ABILIFY) 10 MG tablet Take 10 mg by mouth daily.   12/19/2016 at Unknown time  . carbamazepine (TEGRETOL) 200 MG tablet Take 1 tablet  (200 mg total) by mouth 2 (two) times daily after a meal. (Patient not taking: Reported on 10/02/2016) 60 tablet 0 Not Taking at Unknown time  . lithium carbonate 300 MG capsule Take 300-600 mg by mouth See admin instructions. Take 1 capsule every morning and take 2 capsules at night   12/19/2016 at Unknown time  . OLANZapine zydis (ZYPREXA) 10 MG disintegrating tablet Take 1 tablet (10 mg total) by mouth at bedtime. (Patient not taking: Reported on 10/02/2016) 30 tablet 0 Not Taking at Unknown time    Treatment Modalities: Medication Management, Group therapy, Case management,  1 to 1 session with clinician, Psychoeducation, Recreational therapy.   Physician Treatment Plan for Primary Diagnosis: Schizoaffective disorder, bipolar type ([place]) Long Term Goal(s): Improvement in symptoms so as ready for discharge  Short Term Goals: Ability to verbalize feelings will improve Compliance with prescribed medications will improve   Medication Management: Evaluate patient's response, side effects, and tolerance of medication regimen.  Therapeutic Interventions: 1 to 1 sessions, Unit Group sessions and Medication administration.  Evaluation of Outcomes: Adequate for Discharge   5/28:  Patient with continued command AH to kill people, possibility of cheeking his medications, will monitor closely.  Will increase Abilify to 20 mg PO qhs for psychosis/mood sx. Will increase Li to 600 mg PO daily and 600 mg PO qpm for mood lability. Li level reviewed- Results for  Justin Chaney, Justin Chaney (MRN [id number]) as of 12/27/2016 13:43  Ref. Range 12/26/2016 06:06  Lithium Latest Ref Range: 0.60 - 1.20 mmol/L 0.19 (L)  His Li level went down inspite of being on it for several days - on admission was higher.  Will continue Trazodone 50 mg QHS PRN  for insomnia. Will continue Vistaril 25 mgrs Q 8 hours PRN for anxiety    Physician Treatment Plan for Secondary Diagnosis: Principal Problem:   Schizoaffective disorder,  bipolar type ([place]) Active Problems:   Intermittent explosive disorder   Cannabis use disorder, moderate, in sustained remission (HCC)   Cocaine use disorder, severe, in early remission (HCC)   Amphetamine abuse in remission   Tobacco use disorder   Long Term Goal(s): Improvement in symptoms so as ready for discharge  Short Term Goals: Ability to verbalize feelings will improve Compliance with prescribed medications will improve   Medication Management: Evaluate patient's response, side effects, and tolerance of medication regimen.  Therapeutic Interventions: 1 to 1 sessions, Unit Group sessions and Medication administration.  Evaluation of Outcomes: Adequate for Discharge   RN Treatment Plan for Primary Diagnosis: Schizoaffective disorder, bipolar type ([place]) Long Term Goal(s): Knowledge of disease and therapeutic regimen to maintain health will improve  Short Term Goals: Ability to identify and develop effective coping behaviors will improve and Compliance with prescribed medications will improve  Medication Management: RN will administer medications as ordered by provider, will assess and evaluate patient's response and provide education to patient for prescribed medication. RN will report any adverse and/or side effects to prescribing provider.  Therapeutic Interventions: 1 on 1 counseling sessions, Psychoeducation, Medication administration, Evaluate responses to treatment, Monitor vital signs and CBGs as ordered, Perform/monitor CIWA, COWS, AIMS and Fall Risk screenings as ordered, Perform wound care treatments as ordered.  Evaluation of Outcomes: Adequate for Discharge   LCSW Treatment Plan for Primary Diagnosis: Schizoaffective disorder, bipolar type ([place]) Long Term Goal(s): Safe transition to appropriate next level of care at discharge, Engage patient in therapeutic group addressing interpersonal concerns.  Short Term Goals: Engage patient in aftercare planning with  referrals and resources  Therapeutic Interventions: Assess for all discharge needs, 1 to 1 time with Social worker, Explore available resources and support systems, Assess for adequacy in community support network, Educate family and significant other(s) on suicide prevention, Complete Psychosocial Assessment, Interpersonal group therapy.  Evaluation of Outcomes:  Return home, follow up [facility]   Progress in Treatment: Attending groups: Yes Participating in groups: Yes Taking medication as prescribed: Yes Toleration medication: Yes, no side effects reported at this time Family/Significant other contact made: No Patient understands diagnosis:No  Limited insight.  Apparently feels safe in the hospital based on number of recent past hospitalizations and ED visits Discussing patient identified problems/goals with staff: Yes Medical problems stabilized or resolved: Yes Denies suicidal/homicidal ideation: Yes Issues/concerns per patient self-inventory: None Other: N/A  New problem(s) identified: None identified at this time.   New Short Term/Long Term Goal(s): None identified at this time.   Discharge Plan or Barriers:   Reason for Continuation of Hospitalization: Due to an amazing, miraculous turn around, pt who had command hallucinations yesterday instructing him to kill others is asymptomatic today   Estimated Length of Stay: D/C today  Attendees: Patient: 12/28/2016  10:40 AM  Physician: Ursula Alert, MD 12/28/2016  10:40 AM  Nursing: Hoy Register, RN 12/28/2016  10:40 AM  RN Care Manager: Lars Pinks, RN 12/28/2016  10:40 AM  Social Worker: Ripley Fraise 12/28/2016  10:40  AM  Recreational Therapist: Marjette  12/28/2016  10:40 AM  Other: Norberto Sorenson 12/28/2016  10:40 AM  Other:  12/28/2016  10:40 AM    Scribe for Treatment Team:  Roque Lias LCSW 12/28/2016 10:40 AM

## 2016-12-28 NOTE — Progress Notes (Signed)
D: Pt was alert and oriented x 4. Pt at the time of assessment continue to be paranoid about his medications-will inspect each med before taking it (Pt understood that this was an abnormal behavior.). Pt denied hallucinations, anxiety, depression, pain, SI or HI; "I can't hear the voices at this time." Pt observed interacting with peers and staff. A: Medications offered as prescribed.  All patient's questions and concerns addressed. Support, encouragement, and safe environment provided.  15-minute safety checks continue. R: Pt was med compliant. Pt attended wrap-up group. Safety checks continue.

## 2016-12-28 NOTE — Progress Notes (Signed)
Adult Psychoeducational Group Note  Date:  12/28/2016 Time:  8:53 PM  Group Topic/Focus:  Wrap-Up Group:   The focus of this group is to help patients review their daily goal of treatment and discuss progress on daily workbooks.  Participation Level:  Active  Participation Quality:  Appropriate  Affect:  Appropriate  Cognitive:  Alert  Insight: Appropriate  Engagement in Group:  Engaged  Modes of Intervention:  Discussion  Additional Comments:  Patient stated having a good day. Patient also stated he had a nervous breakdown because he was still hearing voices and was not ready to leave. Patient's goal for today was to take medication and attend groups. Patient met goal.   Justin Chaney [name] 12/28/2016, 8:53 PM

## 2016-12-28 NOTE — Progress Notes (Signed)
[facility] MD Progress Note  12/28/2016 3:59 PM Justin Chaney  MRN:  [id number] Subjective:  Patient states " I am anxious , I might hurt myself if I go today , I want to attend groups.'   Objective:Patient seen and chart reviewed.Discussed patient with treatment team.  Pt seen as anxious this AM, reports he does not believe he should be discharged . He had initially told writer this AM that he is OK with discharge plan . However , he became restless soon after that . He also reported that he will hurt self if he walks out of the unit. He wants to make use of groups that are helpful.    Principal Problem: Schizoaffective disorder, bipolar type ([place]) Diagnosis:   Patient Active Problem List   Diagnosis Date Noted  . Cocaine use disorder, severe, in early remission ([place]) [F14.21] 12/21/2016  . 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 ADHD (attention deficit hyperactivity disorder) [F90.9] 12/21/2016  . 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 Tobacco use disorder [F17.200] 12/21/2016  . Tobacco use [Z72.0] 10/06/2016  . Homicidal ideations [R45.850] 09/16/2016  . Suicidal ideation [R45.851]   . Hallucinations [R44.3]   . Homicidal ideation [R45.850]   . Suicidal thoughts [R45.851]   . Cannabis use disorder, moderate, in sustained remission ([place]) [F12.21] 11/07/2015  . Depression [F32.9] 10/29/2015  . Intermittent explosive disorder [F63.81] 09/05/2015  . Schizoaffective disorder, bipolar type ([place]) [F25.0] 09/04/2015  . Malingering [Z76.5] 06/05/2015  . Antisocial personality disorder [F60.2] 06/03/2015  . Cocaine abuse [F14.10] 06/03/2015   Total Time spent with patient: 20 minutes   Past Psychiatric History: Please see H&P.   Past Medical History:  Past Medical History:  Diagnosis Date  . Asthma   . Bipolar disorder ([place])   . 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 Depression   . Homelessness   . Schizophrenia ([place])   . Seizures ([place])    childhood seizures    Past Surgical History:  Procedure Laterality Date  . TONSILLECTOMY    . VENTRICULO-PERITONEAL SHUNT PLACEMENT / LAPAROSCOPIC INSERTION PERITONEAL CATHETER     Family History:  Family History  Problem Relation Age of Onset  . Hypertension Mother   . Mental illness Neg Hx    Family Psychiatric  History: Please see H&P.  Social History: Please see H&P.  History  Alcohol Use No     History  Drug Use  . Types: Marijuana, Other-see comments, Amphetamines    Comment: Adderall, "I don't do weed, only rarely"    Social History   Social History  . Marital status: Single    Spouse name: N/A  . Number of children: N/A  . Years of education: N/A   Social History Main Topics  . Smoking status: Former Smoker    Packs/day: 0.50    Types: Cigarettes    Quit date: 10/22/2015  . Smokeless tobacco: Never Used  . Alcohol use No  . Drug use: Yes    Types: Marijuana, Other-see comments, Amphetamines     Comment: Adderall, "I don't do weed, only rarely"  . Sexual activity: Not Asked   Other Topics Concern  . None   Social History Narrative  . None   Additional Social History:    Pain Medications: None Prescriptions: Abilify and Lithium Over the Counter: None History of alcohol / drug use?: No history of alcohol / drug abuse Longest period of sobriety (when/how long): unknown  Sleep: Fair  Appetite:  Fair  Current Medications: Current Facility-Administered Medications  Medication Dose Route Frequency Provider Last Rate Last Dose  . alum & mag hydroxide-simeth (MAALOX/MYLANTA) 200-200-20 MG/5ML suspension 30 mL  30 mL Oral Q4H PRN Okonkwo, Justina A, NP   30 mL at 12/27/16 1718  . ARIPiprazole (ABILIFY) tablet 20 mg  20 mg Oral QHS Ursula Alert, MD   20 mg at 12/27/16 2039  . asenapine (SAPHRIS) sublingual tablet 5 mg  5 mg Sublingual BID PRN Dewanda Fennema, MD      . hydrOXYzine (ATARAX/VISTARIL) tablet 25 mg  25 mg Oral TID PRN Lu Duffel, Justina A,  NP   25 mg at 12/28/16 1212  . lithium carbonate capsule 600 mg  600 mg Oral Q supper Ursula Alert, MD   600 mg at 12/27/16 1620  . lithium carbonate capsule 600 mg  600 mg Oral Daily Bluma Buresh, Ria Clock, MD   600 mg at 12/28/16 0813  . magnesium hydroxide (MILK OF MAGNESIA) suspension 30 mL  30 mL Oral Daily PRN Okonkwo, Justina A, NP   30 mL at 12/26/16 1127  . nicotine (NICODERM CQ - dosed in mg/24 hours) patch 21 mg  21 mg Transdermal Daily Okonkwo, Justina A, NP   21 mg at 12/28/16 0813  . traZODone (DESYREL) tablet 50 mg  50 mg Oral QHS PRN Lu Duffel, Justina A, NP   50 mg at 12/27/16 2038    Lab Results:  No results found for this or any previous visit (from the past 48 hour(s)).  Blood Alcohol level:  Lab Results  Component Value Date   ETH <5 12/19/2016   ETH <5 01/30/1600    Metabolic Disorder Labs: Lab Results  Component Value Date   HGBA1C 5.0 12/22/2016   MPG 97 12/22/2016   MPG 97 09/09/2015   Lab Results  Component Value Date   PROLACTIN 2.4 (L) 12/22/2016   PROLACTIN 8.0 09/09/2015   Lab Results  Component Value Date   CHOL 130 12/22/2016   TRIG 193 (H) 12/22/2016   HDL 35 (L) 12/22/2016   CHOLHDL 3.7 12/22/2016   VLDL 39 12/22/2016   LDLCALC 56 12/22/2016   LDLCALC 84 09/09/2015    Physical Findings: AIMS: Facial and Oral Movements Muscles of Facial Expression: None, normal Lips and Perioral Area: None, normal Jaw: None, normal Tongue: None, normal,Extremity Movements Upper (arms, wrists, hands, fingers): None, normal Lower (legs, knees, ankles, toes): None, normal, Trunk Movements Neck, shoulders, hips: None, normal, Overall Severity Severity of abnormal movements (highest score from questions above): None, normal Incapacitation due to abnormal movements: None, normal Patient's awareness of abnormal movements (rate only patient's report): No Awareness, Dental Status Current problems with teeth and/or dentures?: No Does patient usually wear  dentures?: No  CIWA:    COWS:     Musculoskeletal: Strength & Muscle Tone: within normal limits Gait & Station: normal Patient leans: N/A  Psychiatric Specialty Exam: Physical Exam  Nursing note and vitals reviewed.   Review of Systems  Psychiatric/Behavioral: Positive for hallucinations.  All other systems reviewed and are negative.   Blood pressure 117/69, pulse 99, temperature 97.7 F (36.5 C), temperature source Oral, resp. rate 20, height 5\' 3"  (1.6 m), weight 107 kg (236 lb), SpO2 100 %.Body mass index is 41.81 kg/m.  General Appearance: Fairly Groomed  Eye Contact:  Fair  Speech:  Normal Rate  Volume:  Decreased  Mood:  Anxious and Depressed  Affect:  Congruent  Thought Process:  Goal Directed and Descriptions of Associations: Intact  Orientation:  Full (Time, Place, and Person)  Thought Content:  Hallucinations: Auditory Command:  less loud  and Rumination- command to hurt people - but is decreasing   Suicidal Thoughts:  No, but will kill self if he walks out today since he does not feel safe  Homicidal Thoughts:  No  Memory: recent, remote, immediate - fair  Judgement:  Fair  Insight:  Fair  Psychomotor Activity:  Normal  Concentration:  Concentration: Fair and Attention Span: Fair  Recall:  [employer] of Knowledge:  Fair  Language:  Fair  Akathisia:  Negative  Handed:  Right  AIMS (if indicated):     Assets:  Desire for Improvement Physical Health Resilience  ADL's:  Intact  Cognition:  WNL  Sleep:  Number of Hours: 6.25   Will continue today 12/28/16 plan as below except where it is noted.    Assessment: Patient with worsening anxiety sx , was scheduled to be discharged today , however reported not feeling safe and reported that he will harm self if he walks out of here today.    Plan:  Encourage milieu and group participation to work on coping skills and symptom reduction Will increase Abilify to 25 mg po qhs for psychosis/mood sx. Will increase  Li to 600 mg PO daily and 600 mg PO qpm for mood lability. Li level reviewed- Results for Justin, Chaney (MRN [id number]) as of 12/27/2016 13:43  Ref. Range 12/26/2016 06:06  Lithium Latest Ref Range: 0.60 - 1.20 mmol/L 0.19 (L)  His Li level went down inspite of being on it for several days - on admission was higher.   Will continue Trazodone 50 mg QHS PRN  for insomnia. Will continue Vistaril 25 mgrs Q 8 hours PRN for anxiety  CSW will continue to work on disposition.  Carlyle Achenbach, MD  12/28/2016, 3:59 PM

## 2016-12-28 NOTE — Progress Notes (Signed)
Recreation Therapy Notes  Animal-Assisted Activity (AAA) Program Checklist/Progress Notes Patient Eligibility Criteria Checklist & Daily Group note for Rec TxIntervention  Date: 12/28/2016 Time: 2:55pm Location: [address]    AAA/T Program Assumption of Risk Form signed by Patient/ or Parent Legal Guardian Yes  Patient is free of allergies or sever asthma Yes  Patient reports no fear of animals Yes  Patient reports no history of cruelty to animals Yes  Patient understands his/her participation is voluntary Yes  Patient washes hands before animal contact Yes  Patient washes hands after animal contact Yes  Behavioral Response: Appropriate    Education:Hand Washing, Appropriate Animal Interaction   Education Outcome: Acknowledges education.   Clinical Observations/Feedback: Patient discussed with MD for appropriateness in pet therapy session. Both LRT and MD agree patient is appropriate for participation. Patient offered participation in session and signed necessary consent form without issue. Patient attended and participated appropriately in pet therapy session, petting therapy dog appropriately and interacting with peers appropriately.     Donovan Kail, Recreational Therapy Intern      Donovan Kail 12/28/2016 3:03 PM

## 2016-12-28 NOTE — Progress Notes (Signed)
Recreation Therapy Notes  Date: 12/28/16 Time: 1000 Location: [address]  Group Topic: Coping Skills  Goal Area(s) Addresses:  Patients will be able to identify positive coping skills. Patients will be able to identify benefits of using coping skills post d/c.  Behavioral Response: Minimal  Intervention: Mindmap, pencils  Activity: Mindmap.  Patients were given a blank copy of a mindmap.  LRT and patients filled in the first eight boxes together with various situations that require coping skills (bills, loss, anger, medication, family, surgery, relapse and stress.  Patients were to then identify coping skills that can be used to deal with these situations.  LRT would then fill in the coping skills on the board so patients could fill in the blanks on their sheets.  Education: Radiographer, therapeutic, Dentist.   Education Outcome: Acknowledges understanding/In group clarification offered/Needs additional education.   Clinical Observations/Feedback  Pt stated using coping skills "make you feel human".  Pt also stated where someone uses their positive or negative coping skills depends on the person.  Pt was attentive and engaged during group.   Victorino Sparrow, LRT/CTRS      Victorino Sparrow A 12/28/2016 12:01 PM

## 2016-12-28 NOTE — [facility] Group Notes (Signed)
[facility] LCSW Group Therapy  12/28/2016 1:56 PM   Type of Therapy:  Group Therapy  Participation Level:  Active  Participation Quality:  Attentive  Affect:  Appropriate  Cognitive:  Appropriate  Insight:  Improving  Engagement in Therapy:  Engaged  Modes of Intervention:  Clarification, Education, Exploration and Socialization  Summary of Progress/Problems: Today's group focused on resilience.  Came initially with bag of belongings.  Stayed for awhile, left with bag and did not return. Roque Lias B 12/28/2016 , 1:56 PM

## 2016-12-28 NOTE — Progress Notes (Signed)
D: Justin Chaney denied SI, HI, and AVH this a.m. After Dr. Shea Evans cleared him for discharge, he said he did not feel he was ready and said he was having SI and was experiencing hallucinations. He has been visible in the milieu and among his peers. He did not fill out his self inventory form.   A: Meds given as ordered. Q15 safety checks maintained. Support/encouragement offered.  R: Pt remains free from harm and continues with treatment. Will continue to monitor for needs/safety.

## 2016-12-28 NOTE — Progress Notes (Signed)
Patient ID: Justin Chaney, male   DOB: [birth year], 21 y.o.   MRN: [id number]  Pt currently presents with a flat affect and anxious behavior. Pt reports to writer that their goal is to "take their meds and go to groups." States he met his goal today, when asked about tomorrows goal he states "I don't know, its not tomorrow yet." Pt responses are sarcastic in nature. Requests a sandwich during snack time, oriented to rules and regulations of the unit. Pt reports good sleep with current medication regimen.   Pt provided with medications per providers orders. Pt's labs and vitals were monitored throughout the night. Pt given a 1:1 about emotional and mental status. Pt supported and encouraged to express concerns and questions. Pt educated on medications.  Pt's safety ensured with 15 minute and environmental checks. Pt currently denies SI and HI. Endorses AH and VH. Pt verbally agrees to seek staff if SI/HI occurs, AH/VH worsens and to consult with staff before acting on any harmful thoughts. Will continue POC.

## 2016-12-28 NOTE — [facility] Suicide Risk Assessment (Deleted)
[facility] Discharge Suicide Risk Assessment   Principal Problem: Schizoaffective disorder, bipolar type [facility]) Discharge Diagnoses:  Patient Active Problem List   Diagnosis Date Noted  . Cocaine use disorder, severe, in early remission ([place]) [F14.21] 12/21/2016  . 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 ADHD (attention deficit hyperactivity disorder) [F90.9] 12/21/2016  . 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 Tobacco use disorder [F17.200] 12/21/2016  . Tobacco use [Z72.0] 10/06/2016  . Homicidal ideations [R45.850] 09/16/2016  . Suicidal ideation [R45.851]   . Hallucinations [R44.3]   . Homicidal ideation [R45.850]   . Suicidal thoughts [R45.851]   . Cannabis use disorder, moderate, in sustained remission ([place]) [F12.21] 11/07/2015  . Depression [F32.9] 10/29/2015  . Intermittent explosive disorder [F63.81] 09/05/2015  . Schizoaffective disorder, bipolar type ([place]) [F25.0] 09/04/2015  . Malingering [Z76.5] 06/05/2015  . Antisocial personality disorder [F60.2] 06/03/2015  . Cocaine abuse [F14.10] 06/03/2015    Total Time spent with patient: 30 minutes  Musculoskeletal: Strength & Muscle Tone: within normal limits Gait & Station: normal Patient leans: N/A  Psychiatric Specialty Exam: Review of Systems  Psychiatric/Behavioral: Negative for depression and suicidal ideas.  All other systems reviewed and are negative.   Blood pressure 117/69, pulse 99, temperature 97.7 F (36.5 C), temperature source Oral, resp. rate 20, height 5\' 3"  (1.6 m), weight 107 kg (236 lb), SpO2 100 %.Body mass index is 41.81 kg/m.  General Appearance: Casual  Eye Contact::  Fair  Speech:  Clear and Coherent409  Volume:  Normal  Mood:  Euthymic  Affect:  Appropriate  Thought Process:  Goal Directed and Descriptions of Associations: Intact  Orientation:  Full (Time, Place, and Person)  Thought Content:  Logical  Suicidal Thoughts:  No  Homicidal Thoughts:  No  Memory:  Immediate;    Fair Recent;   Fair Remote;   Fair  Judgement:  Fair  Insight:  Fair  Psychomotor Activity:  Normal  Concentration:  Fair  Recall:  [employer] of Knowledge:Fair  Language: Fair  Akathisia:  No  Handed:  Right  AIMS (if indicated):   0  Assets:  Communication Skills Desire for Improvement  Sleep:  Number of Hours: 6.25  Cognition: WNL  ADL's:  Intact   Mental Status Per Nursing Assessment::   On Admission:  Suicidal ideation indicated by others  Demographic Factors:  Male  Loss Factors: NA  Historical Factors: Impulsivity  Risk Reduction Factors:   Positive social support and Positive therapeutic relationship  Continued Clinical Symptoms:  Previous Psychiatric Diagnoses and Treatments  Cognitive Features That Contribute To Risk:  None    Suicide Risk:  Minimal: No identifiable suicidal ideation.  Patients presenting with no risk factors but with morbid ruminations; may be classified as minimal risk based on the severity of the depressive symptoms  Follow-up Information    [facility] Follow up on 12/30/2016.   Specialty:  Behavioral Health Why:  Thursday at 1:00 with Josph Macho.  If you signed up for TCT services with Myles Gip, you can call him at [phone] to secure a ride for your hospital d/c appointment Contact information: [place]  [phone]           Plan Of Care/Follow-up recommendations:  Activity:  no restrictions Diet:  regular Tests:  Li level needed on monday - 01/02/2017 - possibility patient may be cheeking medications Other:  none  Tabari Volkert, MD 12/28/2016, 11:38 AM

## 2016-12-28 NOTE — BHH Suicide Risk Assessment (Signed)
[facility] INPATIENT:  Family/Significant Other Suicide Prevention Education  Suicide Prevention Education:  Contact Attempts: Nolon Yellin, mother, [phone] has been identified by the patient as the family member/significant other with whom the patient will be residing, and identified as the person(s) who will aid the patient in the event of a mental health crisis.  With written consent from the patient, two attempts were made to provide suicide prevention education, prior to and/or following the patient's discharge.  We were unsuccessful in providing suicide prevention education.  A suicide education pamphlet was given to the patient to share with family/significant other.Phone just rings and rings until there is a hang up.  No VM.  Date and time of first attempt:  12/28/2016 10:54 AM  Date and time of second attempt:12/28/2016 4:40 PM   [name] 12/28/2016, 10:53 AM

## 2016-12-29 NOTE — [facility] Group Notes (Signed)
[facility] LCSW Group Therapy  12/29/2016 1:15 pm  Type of Therapy: Process Group Therapy  Participation Level:  Active  Participation Quality:  Appropriate  Affect:  Flat  Cognitive:  Oriented  Insight:  Improving  Engagement in Group:  Limited  Engagement in Therapy:  Limited  Modes of Intervention:  Activity, Clarification, Education, Problem-solving and Support  Summary of Progress/Problems: Today's group addressed the issue of overcoming obstacles.  Patients were asked to identify their biggest obstacle post d/c that stands in the way of their on-going success, and then problem solve as to how to manage this. Invited.  Chose to not attend.  [name] 12/29/2016   3:53 PM

## 2016-12-29 NOTE — Tx Team (Signed)
Interdisciplinary Treatment and Diagnostic Plan Update  12/29/2016 Time of Session: 3:46 PM  Justin Chaney MRN: [id number]  Principal Diagnosis: Schizoaffective disorder, bipolar type ([place])  Secondary Diagnoses: Principal Problem:   Schizoaffective disorder, bipolar type ([place]) Active Problems:   Intermittent explosive disorder   Cannabis use disorder, moderate, in sustained remission ([place])   Cocaine use disorder, severe, in early remission ([place])   Amphetamine abuse in remission   Tobacco use disorder   Current Medications:  Current Facility-Administered Medications  Medication Dose Route Frequency Provider Last Rate Last Dose  . alum & mag hydroxide-simeth (MAALOX/MYLANTA) 200-200-20 MG/5ML suspension 30 mL  30 mL Oral Q4H PRN Okonkwo, Justina A, NP   30 mL at 12/28/16 1652  . ARIPiprazole (ABILIFY) tablet 25 mg  25 mg Oral QHS Ursula Alert, MD   25 mg at 12/28/16 2030  . asenapine (SAPHRIS) sublingual tablet 5 mg  5 mg Sublingual BID PRN Eappen, Ria Clock, MD      . hydrOXYzine (ATARAX/VISTARIL) tablet 25 mg  25 mg Oral TID PRN Lu Duffel, Justina A, NP   25 mg at 12/29/16 1207  . lithium carbonate capsule 600 mg  600 mg Oral Q supper Ursula Alert, MD   600 mg at 12/28/16 1651  . lithium carbonate capsule 600 mg  600 mg Oral Daily Eappen, Ria Clock, MD   600 mg at 12/29/16 0835  . magnesium hydroxide (MILK OF MAGNESIA) suspension 30 mL  30 mL Oral Daily PRN Okonkwo, Justina A, NP   30 mL at 12/26/16 1127  . nicotine (NICODERM CQ - dosed in mg/24 hours) patch 21 mg  21 mg Transdermal Daily Okonkwo, Justina A, NP   21 mg at 12/29/16 0835  . traZODone (DESYREL) tablet 50 mg  50 mg Oral QHS PRN Okonkwo, Justina A, NP   50 mg at 12/28/16 2030    PTA Medications: Prescriptions Prior to Admission  Medication Sig Dispense Refill Last Dose  . ARIPiprazole (ABILIFY) 10 MG tablet Take 10 mg by mouth daily.   12/19/2016 at Unknown time  . carbamazepine (TEGRETOL) 200 MG tablet Take 1 tablet  (200 mg total) by mouth 2 (two) times daily after a meal. (Patient not taking: Reported on 10/02/2016) 60 tablet 0 Not Taking at Unknown time  . lithium carbonate 300 MG capsule Take 300-600 mg by mouth See admin instructions. Take 1 capsule every morning and take 2 capsules at night   12/19/2016 at Unknown time  . OLANZapine zydis (ZYPREXA) 10 MG disintegrating tablet Take 1 tablet (10 mg total) by mouth at bedtime. (Patient not taking: Reported on 10/02/2016) 30 tablet 0 Not Taking at Unknown time    Treatment Modalities: Medication Management, Group therapy, Case management,  1 to 1 session with clinician, Psychoeducation, Recreational therapy.   Physician Treatment Plan for Primary Diagnosis: Schizoaffective disorder, bipolar type ([place]) Long Term Goal(s): Improvement in symptoms so as ready for discharge  Short Term Goals: Ability to verbalize feelings will improve Compliance with prescribed medications will improve   Medication Management: Evaluate patient's response, side effects, and tolerance of medication regimen.  Therapeutic Interventions: 1 to 1 sessions, Unit Group sessions and Medication administration.  Evaluation of Outcomes: Adequate for Discharge   5/28:  Patient with continued command AH to kill people, possibility of cheeking his medications, will monitor closely.  Will increase Abilify to 20 mg PO qhs for psychosis/mood sx. Will increase Li to 600 mg PO daily and 600 mg PO qpm for mood lability. Li level reviewed- Results for  Justin Chaney, Justin Chaney (MRN [id number]) as of 12/27/2016 13:43  Ref. Range 12/26/2016 06:06  Lithium Latest Ref Range: 0.60 - 1.20 mmol/L 0.19 (L)  His Li level went down inspite of being on it for several days - on admission was higher.  Will continue Trazodone 50 mg QHS PRN  for insomnia. Will continue Vistaril 25 mgrs Q 8 hours PRN for anxiety    Physician Treatment Plan for Secondary Diagnosis: Principal Problem:   Schizoaffective disorder,  bipolar type ([place]) Active Problems:   Intermittent explosive disorder   Cannabis use disorder, moderate, in sustained remission ([facility])   Cocaine use disorder, severe, in early remission ([facility])   Amphetamine abuse in remission   Tobacco use disorder   Long Term Goal(s): Improvement in symptoms so as ready for discharge  Short Term Goals: Ability to verbalize feelings will improve Compliance with prescribed medications will improve   Medication Management: Evaluate patient's response, side effects, and tolerance of medication regimen.  Therapeutic Interventions: 1 to 1 sessions, Unit Group sessions and Medication administration.  Evaluation of Outcomes: Adequate for Discharge   RN Treatment Plan for Primary Diagnosis: Schizoaffective disorder, bipolar type ([place]) Long Term Goal(s): Knowledge of disease and therapeutic regimen to maintain health will improve  Short Term Goals: Ability to identify and develop effective coping behaviors will improve and Compliance with prescribed medications will improve  Medication Management: RN will administer medications as ordered by provider, will assess and evaluate patient's response and provide education to patient for prescribed medication. RN will report any adverse and/or side effects to prescribing provider.  Therapeutic Interventions: 1 on 1 counseling sessions, Psychoeducation, Medication administration, Evaluate responses to treatment, Monitor vital signs and CBGs as ordered, Perform/monitor CIWA, COWS, AIMS and Fall Risk screenings as ordered, Perform wound care treatments as ordered.  Evaluation of Outcomes: Adequate for Discharge   LCSW Treatment Plan for Primary Diagnosis: Schizoaffective disorder, bipolar type ([place]) Long Term Goal(s): Safe transition to appropriate next level of care at discharge, Engage patient in therapeutic group addressing interpersonal concerns.  Short Term Goals: Engage patient in aftercare planning with  referrals and resources   Therapeutic Interventions: Assess for all discharge needs, 1 to 1 time with Social worker, Explore available resources and support systems, Assess for adequacy in community support network, Educate family and significant other(s) on suicide prevention, Complete Psychosocial Assessment, Interpersonal group therapy.  Evaluation of Outcomes: Met  Return home, follow up [facility]   Progress in Treatment: Attending groups: Yes Participating in groups: Yes Taking medication as prescribed: Yes Toleration medication: Yes, no side effects reported at this time Family/Significant other contact made: No Patient understands diagnosis:No  Limited insight.  Apparently feels safe in the hospital based on number of recent past hospitalizations and ED visits Discussing patient identified problems/goals with staff: Yes Medical problems stabilized or resolved: Yes Denies suicidal/homicidal ideation: Yes Issues/concerns per patient self-inventory: None Other: N/A  New problem(s) identified: None identified at this time.   New Short Term/Long Term Goal(s): None identified at this time.   Discharge Plan or Barriers:   Reason for Continuation of Hospitalization:  5/29: Due to an amazing, miraculous turn around, pt who had command hallucinations yesterday instructing him to kill others is asymptomatic today and asking to be discharged  5/30:  Due to another amazing turn around, pt who was ready for d/c yesterday AM then became symptomatic a couple of hours later, stating "I am anxious, I might hurt myself if I go today."  D/C cancelled as a result;  LOS unknown at this point.  5/31: Yet another amazing turnaround renders Justin Chaney ready to d/c today.   Estimated Length of Stay: D/C today  Attendees: Patient: 12/29/2016  3:46 PM  Physician: Ursula Alert, MD 12/29/2016  3:46 PM  Nursing: Elesa Massed, RN 12/29/2016  3:46 PM  RN Care Manager: Lars Pinks, RN 12/29/2016  3:46 PM   Social Worker: Ripley Fraise 12/29/2016  3:46 PM  Recreational Therapist: Laretta Bolster  12/29/2016  3:46 PM  Other: Norberto Sorenson 12/29/2016  3:46 PM  Other:  12/29/2016  3:46 PM    Scribe for Treatment Team:  Roque Lias LCSW 12/29/2016 3:46 PM

## 2016-12-29 NOTE — Plan of Care (Signed)
Problem: Safety: Goal: Periods of time without injury will increase Outcome: Progressing Pt. remains a low fall risk, denies SI/HI, +AVH, Q 15 checks in effect.

## 2016-12-29 NOTE — Progress Notes (Signed)
[facility] MD Progress Note  12/29/2016 4:07 PM Justin Chaney  MRN:  [id number] Subjective:  Patient states " I am still nervous , I still hear voices.'    Objective:Patient seen and chart reviewed.Discussed patient with treatment team.  Patient today seen as anxious , continues to endorse AH , reports he continues to be fearful of his safety and wants to work on Radiographer, therapeutic . Per staff -pt continues to need support.      Principal Problem: Schizoaffective disorder, bipolar type ([place]) Diagnosis:   Patient Active Problem List   Diagnosis Date Noted  . Cocaine use disorder, severe, in early remission ([place]) [F14.21] 12/21/2016  . 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 ADHD (attention deficit hyperactivity disorder) [F90.9] 12/21/2016  . 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 Tobacco use disorder [F17.200] 12/21/2016  . Tobacco use [Z72.0] 10/06/2016  . Homicidal ideations [R45.850] 09/16/2016  . Suicidal ideation [R45.851]   . Hallucinations [R44.3]   . Homicidal ideation [R45.850]   . Suicidal thoughts [R45.851]   . Cannabis use disorder, moderate, in sustained remission ([place]) [F12.21] 11/07/2015  . Depression [F32.9] 10/29/2015  . Intermittent explosive disorder [F63.81] 09/05/2015  . Schizoaffective disorder, bipolar type ([place]) [F25.0] 09/04/2015  . Malingering [Z76.5] 06/05/2015  . Antisocial personality disorder [F60.2] 06/03/2015  . Cocaine abuse [F14.10] 06/03/2015   Total Time spent with patient: 20 minutes  Past Psychiatric History: Please see H&P.   Past Medical History:  Past Medical History:  Diagnosis Date  . Asthma   . Bipolar disorder ([place])   . 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 Depression   . Homelessness   . Schizophrenia ([place])   . Seizures ([place])    childhood seizures    Past Surgical History:  Procedure Laterality Date  . TONSILLECTOMY    . VENTRICULO-PERITONEAL SHUNT PLACEMENT / LAPAROSCOPIC INSERTION  PERITONEAL CATHETER     Family History:  Family History  Problem Relation Age of Onset  . Hypertension Mother   . Mental illness Neg Hx    Family Psychiatric  History: Please see H&P.  Social History: Please see H&P.  History  Alcohol Use No     History  Drug Use  . Types: Marijuana, Other-see comments, Amphetamines    Comment: Adderall, "I don't do weed, only rarely"    Social History   Social History  . Marital status: Single    Spouse name: N/A  . Number of children: N/A  . Years of education: N/A   Social History Main Topics  . Smoking status: Former Smoker    Packs/day: 0.50    Types: Cigarettes    Quit date: 10/22/2015  . Smokeless tobacco: Never Used  . Alcohol use No  . Drug use: Yes    Types: Marijuana, Other-see comments, Amphetamines     Comment: Adderall, "I don't do weed, only rarely"  . Sexual activity: Not Asked   Other Topics Concern  . None   Social History Narrative  . None   Additional Social History:    Pain Medications: None Prescriptions: Abilify and Lithium Over the Counter: None History of alcohol / drug use?: No history of alcohol / drug abuse Longest period of sobriety (when/how long): unknown  Sleep: Fair  Appetite:  Fair  Current Medications: Current Facility-Administered Medications  Medication Dose Route Frequency Provider Last Rate Last Dose  . alum & mag hydroxide-simeth (MAALOX/MYLANTA) 200-200-20 MG/5ML suspension 30 mL  30 mL Oral Q4H PRN Okonkwo, Justina A,  NP   30 mL at 12/28/16 1652  . ARIPiprazole (ABILIFY) tablet 25 mg  25 mg Oral QHS Ursula Alert, MD   25 mg at 12/28/16 2030  . asenapine (SAPHRIS) sublingual tablet 5 mg  5 mg Sublingual BID PRN Seidy Labreck, Ria Clock, MD      . hydrOXYzine (ATARAX/VISTARIL) tablet 25 mg  25 mg Oral TID PRN Lu Duffel, Justina A, NP   25 mg at 12/29/16 1207  . lithium carbonate capsule 600 mg  600 mg Oral Q supper Ursula Alert, MD   600 mg at 12/28/16 1651  . lithium carbonate  capsule 600 mg  600 mg Oral Daily Tesneem Dufrane, Ria Clock, MD   600 mg at 12/29/16 0835  . magnesium hydroxide (MILK OF MAGNESIA) suspension 30 mL  30 mL Oral Daily PRN Okonkwo, Justina A, NP   30 mL at 12/26/16 1127  . nicotine (NICODERM CQ - dosed in mg/24 hours) patch 21 mg  21 mg Transdermal Daily Okonkwo, Justina A, NP   21 mg at 12/29/16 0835  . traZODone (DESYREL) tablet 50 mg  50 mg Oral QHS PRN Okonkwo, Justina A, NP   50 mg at 12/28/16 2030    Lab Results:  No results found for this or any previous visit (from the past 48 hour(s)).  Blood Alcohol level:  Lab Results  Component Value Date   ETH <5 12/19/2016   ETH <5 54/27/0623    Metabolic Disorder Labs: Lab Results  Component Value Date   HGBA1C 5.0 12/22/2016   MPG 97 12/22/2016   MPG 97 09/09/2015   Lab Results  Component Value Date   PROLACTIN 2.4 (L) 12/22/2016   PROLACTIN 8.0 09/09/2015   Lab Results  Component Value Date   CHOL 130 12/22/2016   TRIG 193 (H) 12/22/2016   HDL 35 (L) 12/22/2016   CHOLHDL 3.7 12/22/2016   VLDL 39 12/22/2016   LDLCALC 56 12/22/2016   LDLCALC 84 09/09/2015    Physical Findings: AIMS: Facial and Oral Movements Muscles of Facial Expression: None, normal Lips and Perioral Area: None, normal Jaw: None, normal Tongue: None, normal,Extremity Movements Upper (arms, wrists, hands, fingers): None, normal Lower (legs, knees, ankles, toes): None, normal, Trunk Movements Neck, shoulders, hips: None, normal, Overall Severity Severity of abnormal movements (highest score from questions above): None, normal Incapacitation due to abnormal movements: None, normal Patient's awareness of abnormal movements (rate only patient's report): No Awareness, Dental Status Current problems with teeth and/or dentures?: No Does patient usually wear dentures?: No  CIWA:    COWS:     Musculoskeletal: Strength & Muscle Tone: within normal limits Gait & Station: normal Patient leans: N/A  Psychiatric  Specialty Exam: Physical Exam  Nursing note and vitals reviewed.   Review of Systems  Psychiatric/Behavioral: Positive for hallucinations.  All other systems reviewed and are negative.   Blood pressure 108/63, pulse 100, temperature 98.4 F (36.9 C), temperature source Oral, resp. rate 20, height 5\' 3"  (1.6 m), weight 107 kg (236 lb), SpO2 100 %.Body mass index is 41.81 kg/m.  General Appearance: Fairly Groomed  Eye Contact:  Fair  Speech:  Normal Rate  Volume:  Decreased  Mood:  Anxious and Depressed  Affect:  Congruent  Thought Process:  Goal Directed and Descriptions of Associations: Intact  Orientation:  Full (Time, Place, and Person)  Thought Content:  Hallucinations: Auditory Command:  improving and Rumination command to hurt self, improving  Suicidal Thoughts:  No but will kill self if he gets out of here  Homicidal Thoughts:  No  Memory: recent, remote, immediate - fair  Judgement:  Fair  Insight:  Fair  Psychomotor Activity:  Normal  Concentration:  Concentration: Fair and Attention Span: Fair  Recall:  [employer] of Knowledge:  Fair  Language:  Fair  Akathisia:  Negative  Handed:  Right  AIMS (if indicated):     Assets:  Desire for Improvement Physical Health Resilience  ADL's:  Intact  Cognition:  WNL  Sleep:  Number of Hours: 4.25    Will continue today 12/29/16 plan as below except where it is noted.   Assessment: Patient with worsening sx of anxiety and sadness , discharge was scheduled for yesterday , but reported SI if discharged , today seems to be improving. Possible discharge tomorrow if continues to improve.   Plan: - No changes made today Encourage milieu and group participation to work on coping skills and symptom reduction Will increase Abilify to 25 mg po qhs for psychosis/mood sx. Will increase Li to 600 mg PO daily and 600 mg PO qpm for mood lability. Li level reviewed- Results for MITSUO, BUDNICK (MRN [id number]) as of 12/27/2016 13:43   Ref. Range 12/26/2016 06:06  Lithium Latest Ref Range: 0.60 - 1.20 mmol/L 0.19 (L)  His Li level went down inspite of being on it for several days - on admission was higher.  Next Li level - 01/02/2017  Will continue Trazodone 50 mg QHS PRN  for insomnia. Will continue Vistaril 25 mgrs Q 8 hours PRN for anxiety  CSW will continue to work on disposition.  Felicia Bloomquist, MD  12/29/2016, 4:07 PM

## 2016-12-29 NOTE — Progress Notes (Signed)
Recreation Therapy Notes  Date: 12/29/16 Time: 1000 Location: 500 Hall Dayroom  Group Topic: Wellness  Goal Area(s) Addresses:  Patient will define components of whole wellness. Patient will verbalize benefit of whole wellness.  Behavioral Response: Engaged  Intervention:  Tax adviser, beach ball  Activity:  Keep It Chartered certified accountant.  Patients were seated in a circle.  Patients were to pass the ball back and forth to each other.  LRT would count the number of hits the patients got on the ball.  Patients could bounce the ball off of the floor but the ball could not come to a complete stop.  If the ball stopped the count would start from the beginning.   Education: Wellness, Dentist.   Education Outcome: Acknowledges education/In group clarification offered/Needs additional education.   Clinical Observations/Feedback: Pt was engaged and active during group.  Pt was social with peers.  Pt left early and did not return.   Victorino Sparrow, LRT/CTRS         Victorino Sparrow A 12/29/2016 12:05 PM

## 2016-12-29 NOTE — Progress Notes (Signed)
Adult Psychoeducational Group Note  Date:  12/29/2016 Time:  9:12 PM  Group Topic/Focus:  Wrap-Up Group:   The focus of this group is to help patients review their daily goal of treatment and discuss progress on daily workbooks.  Participation Level:  Active  Participation Quality:  Appropriate  Affect:  Appropriate  Cognitive:  Alert  Insight: Appropriate  Engagement in Group:  Engaged  Modes of Intervention:  Discussion  Additional Comments:  Patient rated his day an 8. Patient's goal for today was to attend groups. Patient stated he attend all except for one.   [name] 12/29/2016, 9:12 PM

## 2016-12-29 NOTE — Progress Notes (Signed)
  DATA ACTION RESPONSE  Objective- Pt. is visible in the dayroom, seen watching TV and interacting with peers. Presents with an animated/anxious affect and mood. Appears hyperactive. No further c/o. No abnormal s/s.  Subjective- Denies having any SI/HI/Pain at this time. +AVH stating "I hear random people talking to me, telling me to hurt myself, I see spiritual stuff". Pt. endorses that he is not ready to go home. Is cooperative and remain safe on the unit.  1:1 interaction in private to establish rapport. Encouragement, education, & support given from staff.  PRN Vistaril and Trazdone requested and will re-eval accordingly.   Safety maintained with Q 15 checks. Continue with POC.

## 2016-12-30 MED ORDER — LITHIUM CARBONATE 600 MG PO CAPS: ORAL_CAPSULE | ORAL | 0 refills | Status: DC

## 2016-12-30 MED ORDER — ARIPIPRAZOLE 10 MG PO TABS: 10.0000 mg | ORAL_TABLET | Freq: Every day | ORAL | 0 refills | Status: DC

## 2016-12-30 MED ORDER — TRAZODONE HCL 50 MG PO TABS: 50.0000 mg | ORAL_TABLET | Freq: Every evening | ORAL | 0 refills | Status: DC | PRN

## 2016-12-30 MED ORDER — HYDROXYZINE HCL 25 MG PO TABS: 25.0000 mg | ORAL_TABLET | Freq: Three times a day (TID) | ORAL | 0 refills | Status: DC | PRN

## 2016-12-30 MED ORDER — NICOTINE 21 MG/24HR TD PT24: 21.0000 mg | MEDICATED_PATCH | Freq: Every day | TRANSDERMAL | 0 refills | Status: DC

## 2016-12-30 NOTE — Progress Notes (Signed)
  [facility] Adult Case Management Discharge Plan :  Will you be returning to the same living situation after discharge:  No. At discharge, do you have transportation home?: Yes,  bus pass Do you have the ability to pay for your medications: Yes,  MCD  Release of information consent forms completed and in the chart;  Patient's signature needed at discharge.  Patient to Follow up at: Follow-up Information    [facility] Follow up on 01/04/2017.   Specialty:  Behavioral Health Why:  Tuesday at 1:20 with Josph Macho.  If you signed up for TCT services with Myles Gip, you can call him at [phone] to secure a ride for your hospital d/c appointment Contact information: [place] [address] [phone]           Next level of care provider has access to [place] and Suicide Prevention discussed: Yes,  yes  Have you used any form of tobacco in the last 30 days? (Cigarettes, Smokeless Tobacco, Cigars, and/or Pipes): Yes  Has patient been referred to the Quitline?: Patient refused referral  Patient has been referred for addiction treatment: [facility] 12/30/2016, 1:21 PM

## 2016-12-30 NOTE — Progress Notes (Signed)
Recreation Therapy Notes  Date:12/30/16 Time:1000 Location: 500 Hall Dayroom  Group Topic: Anger Management  Goal Area(s) Addresses:  Patient will identify triggers for anger.  Patient will identify physical reaction to anger.  Patient will identify benefit of using coping skills when angry.  Behavioral Response:Engaged  Intervention:Worksheets, pencils  Activity:Umbrella of Emotion. Patients were given a picture of an umbrella. In the umbrella, patients were to identify underlying emotions to their anger. If they were unable to name specific emotions that the anger is covering, they were to identify circumstances and situations that get them angry.  Education:Anger Management, Discharge Planning   Education Outcome: Acknowledges education/In group clarification offered/Needs additional education.   Clinical Observations/Feedback:Pt arrived late.  Pt shared that yelling at him, putting your finger in his face during an argument and being lied to make him angry.  Pt identified his coping skills as avoid the argument, listen to music, don't talk to them and forgive but don't forget.  Pt expressed identifying his coping skills will help him to use them more often.   Victorino Sparrow, LRT/CTRS       Ria Comment, Glorie Dowlen A 12/30/2016 1:01 PM

## 2016-12-30 NOTE — Discharge Summary (Signed)
Physician Discharge Summary Note  Patient:  Justin Chaney is an 21 y.o., male MRN:  [id number] DOB:  04/02/1996 Patient phone:  [phone] (home)  Patient address:   [place] [address],  Total Time spent with patient: 30 minutes  Date of Admission:  12/20/2016 Date of Discharge: 12/30/2016  Reason for Admission:  Per H&P, Kiaan is a 40 y old AAM,single, who is on SSI, lives in [place] with mother, has a hx of schizoaffective do as well as several other diagnosis( antisocial PD , IED) as well as polysubstance abuse, who presented today with worsening depression and SI.  Principal Problem: Schizoaffective disorder, bipolar type ([place]) Discharge Diagnoses: Patient Active Problem List   Diagnosis Date Noted  . Cocaine use disorder, severe, in early remission ([place]) [F14.21] 12/21/2016  . 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 ADHD (attention deficit hyperactivity disorder) [F90.9] 12/21/2016  . 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 Tobacco use disorder [F17.200] 12/21/2016  . Tobacco use [Z72.0] 10/06/2016  . Homicidal ideations [R45.850] 09/16/2016  . Suicidal ideation [R45.851]   . Hallucinations [R44.3]   . Homicidal ideation [R45.850]   . Suicidal thoughts [R45.851]   . Cannabis use disorder, moderate, in sustained remission ([place]) [F12.21] 11/07/2015  . Depression [F32.9] 10/29/2015  . Intermittent explosive disorder [F63.81] 09/05/2015  . Schizoaffective disorder, bipolar type ([place]) [F25.0] 09/04/2015  . Malingering [Z76.5] 06/05/2015  . Antisocial personality disorder [F60.2] 06/03/2015  . Cocaine abuse [F14.10] 06/03/2015    Past Psychiatric History: See H&P  Past Medical History:  Past Medical History:  Diagnosis Date  . Asthma   . Bipolar disorder ([place])   . 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 Depression   . Homelessness   . Schizophrenia ([place])   . Seizures ([place])    childhood seizures    Past Surgical  History:  Procedure Laterality Date  . TONSILLECTOMY    . VENTRICULO-PERITONEAL SHUNT PLACEMENT / LAPAROSCOPIC INSERTION PERITONEAL CATHETER     Family History:  Family History  Problem Relation Age of Onset  . Hypertension Mother   . Mental illness Neg Hx    Family Psychiatric  History: See H&P  Social History:  History  Alcohol Use No     History  Drug Use  . Types: Marijuana, Other-see comments, Amphetamines    Comment: Adderall, "I don't do weed, only rarely"    Social History   Social History  . Marital status: Single    Spouse name: N/A  . Number of children: N/A  . Years of education: N/A   Social History Main Topics  . Smoking status: Former Smoker    Packs/day: 0.50    Types: Cigarettes    Quit date: 10/22/2015  . Smokeless tobacco: Never Used  . Alcohol use No  . Drug use: Yes    Types: Marijuana, Other-see comments, Amphetamines     Comment: Adderall, "I don't do weed, only rarely"  . Sexual activity: Not Asked   Other Topics Concern  . None   Social History Narrative  . None    Hospital Course:  Patient were discharged on the following medications:  ARIPiprazole 10 MG tablet at bedtime for depression HydrOXYzine 25 MG tablet three times a day as needed for anxiety Lithium 600 MG capsule in the morning and at supper mood control Nicotine 21 mg/24hr patch for smoking cessation TraZODone 50 MG tablet for sleep    Physical Findings: AIMS: Facial and Oral Movements Muscles of  Facial Expression: None, normal Lips and Perioral Area: None, normal Jaw: None, normal Tongue: None, normal,Extremity Movements Upper (arms, wrists, hands, fingers): None, normal Lower (legs, knees, ankles, toes): None, normal, Trunk Movements Neck, shoulders, hips: None, normal, Overall Severity Severity of abnormal movements (highest score from questions above): None, normal Incapacitation due to abnormal movements: None, normal Patient's awareness of abnormal  movements (rate only patient's report): No Awareness, Dental Status Current problems with teeth and/or dentures?: No Does patient usually wear dentures?: No  CIWA:    COWS:     Musculoskeletal: Strength & Muscle Tone: within normal limits Gait & Station: normal Patient leans: N/A  Psychiatric Specialty Exam: Physical Exam  ROS  Blood pressure 108/63, pulse 100, temperature 98.4 F (36.9 C), temperature source Oral, resp. rate 20, height 5\' 3"  (1.6 m), weight 107 kg (236 lb), SpO2 100 %.Body mass index is 41.81 kg/m.  General Appearance: patient was wearing a hospital scrub.  Eye Contact:  Good  Speech:  Clear and Coherent and Normal Rate  Volume:  Normal  Mood:  Euthymic  Affect:  Congruent  Thought Process:  Coherent  Orientation:  Full (Time, Place, and Person)  Thought Content:  WDL and Logical  Suicidal Thoughts:  No  Homicidal Thoughts:  No  Memory:  Immediate;   Good Recent;   Good Remote;   Good  Judgement:  Good  Insight:  Good  Psychomotor Activity:  Normal  Concentration:  Concentration: Good and Attention Span: Good  Recall:  Good  Fund of Knowledge:  Good  Language:  Good  Akathisia:  Negative  Handed:  Right  AIMS (if indicated):     Assets:  Communication Skills Desire for Improvement Financial Resources/Insurance Housing Physical Health Resilience Social Support  ADL's:  Intact  Cognition:  WNL  Sleep:  Number of Hours: 6.75     Have you used any form of tobacco in the last 30 days? (Cigarettes, Smokeless Tobacco, Cigars, and/or Pipes): Yes  Has this patient used any form of tobacco in the last 30 days? (Cigarettes, Smokeless Tobacco, Cigars, and/or Pipes) Yes, discharged on 21 mg Nicotine patch   Blood Alcohol level:  Lab Results  Component Value Date   ETH <5 12/19/2016   ETH <5 01/23/7627    Metabolic Disorder Labs:  Lab Results  Component Value Date   HGBA1C 5.0 12/22/2016   MPG 97 12/22/2016   MPG 97 09/09/2015   Lab Results   Component Value Date   PROLACTIN 2.4 (L) 12/22/2016   PROLACTIN 8.0 09/09/2015   Lab Results  Component Value Date   CHOL 130 12/22/2016   TRIG 193 (H) 12/22/2016   HDL 35 (L) 12/22/2016   CHOLHDL 3.7 12/22/2016   VLDL 39 12/22/2016   LDLCALC 56 12/22/2016   LDLCALC 84 09/09/2015    See Psychiatric Specialty Exam and Suicide Risk Assessment completed by Attending Physician prior to discharge.  Discharge destination:  Home  Is patient on multiple antipsychotic therapies at discharge:  No   Has Patient had three or more failed trials of antipsychotic monotherapy by history:  No  Recommended Plan for Multiple Antipsychotic Therapies: NA  Discharge Instructions    Discharge instructions    Complete by:  As directed    FYI: Please have your blood drawn tomorrow 12/31/16 for your Lithium level check.     Allergies as of 12/30/2016      Reactions   Amoxicillin Hives, Other (See Comments)   Has patient had a PCN reaction  causing immediate rash, facial/tongue/throat swelling, SOB or lightheadedness with hypotension: No Has patient had a PCN reaction causing severe rash involving mucus membranes or skin necrosis: No Has patient had a PCN reaction that required hospitalization No Has patient had a PCN reaction occurring within the last 10 years: No If all of the above answers are "NO", then may proceed with Cephalosporin use.   Penicillins Hives, Other (See Comments)   Has patient had a PCN reaction causing immediate rash, facial/tongue/throat swelling, SOB or lightheadedness with hypotension: No Has patient had a PCN reaction causing severe rash involving mucus membranes or skin necrosis: No Has patient had a PCN reaction that required hospitalization No Has patient had a PCN reaction occurring within the last 10 years: No If all of the above answers are "NO", then may proceed with Cephalosporin use.      Medication List    STOP taking these medications   carbamazepine 200 MG  tablet Commonly known as:  TEGRETOL   OLANZapine zydis 10 MG disintegrating tablet Commonly known as:  ZYPREXA     TAKE these medications     Indication  ARIPiprazole 10 MG tablet Commonly known as:  ABILIFY Take 1 tablet (10 mg total) by mouth at bedtime. For depression What changed:  when to take this  additional instructions  Indication:  Major Depressive Disorder   hydrOXYzine 25 MG tablet Commonly known as:  ATARAX/VISTARIL Take 1 tablet (25 mg total) by mouth 3 (three) times daily as needed for anxiety.  Indication:  Anxiety Neurosis   lithium 600 MG capsule Take 1 capsule (600 mgs) in the morning and at supper: For mood stabilization What changed:  medication strength  how much to take  how to take this  when to take this  additional instructions  Indication:  Manic-Depression   nicotine 21 mg/24hr patch Commonly known as:  NICODERM CQ - dosed in mg/24 hours Place 1 patch (21 mg total) onto the skin daily. For smoking ceasation Start taking on:  12/31/2016  Indication:  Nicotine Addiction   traZODone 50 MG tablet Commonly known as:  DESYREL Take 1 tablet (50 mg total) by mouth at bedtime as needed for sleep.  Indication:  Trouble Sleeping      Follow-up Information    [facility] Follow up on 01/04/2017.   Specialty:  Behavioral Health Why:  Tuesday at 1:20 with Josph Macho.  If you signed up for TCT services with Myles Gip, you can call him at [phone] to secure a ride for your hospital d/c appointment Contact information: [place] [address] [phone]           Follow-up recommendations:  Activities as tolerated. Diet: As recommended by your primary care provider. Keep all scheduled follow-up appointment as recommended.   Comments:   Patient is instructed prior to discharge to; Marland Kitchen Take all medications as prescribed by his mental healthcare provider. . Report any adverse effects and or reactions from the medicines to his  outpatient provider promptly. . To not engage in alcohol and or illegal drug use while on prescription medicines. . To follow-up with his primary care provider for your other medical issues, concerns and or health care needs. . In the event of worsening symptoms, patient is instructed to call the crisis hotline, 911 and or go to the nearest ED for appropriate evaluation and treatment of symptoms.   Signed: Vicenta Aly, NP 12/30/2016, 11:33 AM

## 2016-12-30 NOTE — Plan of Care (Signed)
Problem: [facility] Participation in Recreation Therapeutic Interventions Goal: STG-Patient will identify at least five coping skills for ** STG: Coping Skills - Patient will be able to identify at least 5 coping skills for depression by conclusion of recreation therapy tx  Outcome: Completed/Met Date Met: 12/30/16 Pt was able to identify coping skills at completion of coping skills and anger management recreation therapy sessions.  Victorino Sparrow, LRT/CTRS

## 2016-12-30 NOTE — BHH Suicide Risk Assessment (Signed)
[facility] Discharge Suicide Risk Assessment   Principal Problem: Schizoaffective disorder, bipolar type ([place])  H&P: Patient is a 21 year old African-American male who lives with his mother [place],, was admitted for worsening of depression with suicidal ideation. Patient is diagnosed with schizoaffective disorder, antisocial personality disorder, polysubstance use disorder.  Patient states that he decompensated because his friend recently passed away from a seizure. He has that he was very close to his friend, started hearing voices, felt depressed and paranoid.  Patient states that he is doing much better, denies hearing any voices, feeling paranoid, adds his mood has improved and he is ready for discharge. Patient denies having any thoughts of hurting himself or others. He has that he takes his medications regularly as prescribed. He reports that he's eating fine and sleeping well. Discharge Diagnoses:  Patient Active Problem List   Diagnosis Date Noted  . Cocaine use disorder, severe, in early remission ([place]) [F14.21] 12/21/2016  . 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 ADHD (attention deficit hyperactivity disorder) [F90.9] 12/21/2016  . 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 Tobacco use disorder [F17.200] 12/21/2016  . Tobacco use [Z72.0] 10/06/2016  . Homicidal ideations [R45.850] 09/16/2016  . Suicidal ideation [R45.851]   . Hallucinations [R44.3]   . Homicidal ideation [R45.850]   . Suicidal thoughts [R45.851]   . Cannabis use disorder, moderate, in sustained remission ([place]) [F12.21] 11/07/2015  . Depression [F32.9] 10/29/2015  . Intermittent explosive disorder [F63.81] 09/05/2015  . Schizoaffective disorder, bipolar type ([place]) [F25.0] 09/04/2015  . Malingering [Z76.5] 06/05/2015  . Antisocial personality disorder [F60.2] 06/03/2015  . Cocaine abuse [F14.10] 06/03/2015    Total Time spent with patient: 30 minutes  Musculoskeletal: Strength &  Muscle Tone: within normal limits Gait & Station: normal Patient leans: N/A  Psychiatric Specialty Exam: Review of Systems  Constitutional: Negative.  Negative for fever, malaise/fatigue and weight loss.  HENT: Negative.  Negative for congestion and sore throat.   Eyes: Negative.  Negative for blurred vision, double vision and photophobia.  Respiratory: Negative.  Negative for cough, shortness of breath and wheezing.   Cardiovascular: Negative.  Negative for chest pain and palpitations.  Gastrointestinal: Negative.  Negative for abdominal pain, heartburn, nausea and vomiting.  Genitourinary: Negative.  Negative for dysuria.  Musculoskeletal: Negative.  Negative for falls and myalgias.  Skin: Negative.  Negative for rash.  Neurological: Negative.  Negative for dizziness, focal weakness, seizures, loss of consciousness, weakness and headaches.  Endo/Heme/Allergies: Negative.  Negative for environmental allergies.  Psychiatric/Behavioral: Negative.  Negative for depression, hallucinations, memory loss, substance abuse and suicidal ideas. The patient is not nervous/anxious and does not have insomnia.     Blood pressure 108/63, pulse 100, temperature 98.4 F (36.9 C), temperature source Oral, resp. rate 20, height 5\' 3"  (1.6 m), weight 107 kg (236 lb), SpO2 100 %.Body mass index is 41.81 kg/m.  General Appearance: Casual  Eye Contact::  Good  Speech:  Clear and Coherent and Normal Rate409  Volume:  Normal  Mood:  Euthymic  Affect:  Congruent and Full Range  Thought Process:  Goal Directed and Descriptions of Associations: Intact  Orientation:  Full (Time, Place, and Person)  Thought Content:  WDL  Suicidal Thoughts:  No  Homicidal Thoughts:  No  Memory:  Immediate;   Fair Recent;   Fair Remote;   Fair  Judgement:  Intact  Insight:  Fair  Psychomotor Activity:  Normal  Concentration:  Good  Recall:  Good  Fund of [place]  Language:  Fair  Akathisia:  No  Handed:  Right   AIMS (if indicated):     Assets:  Housing Leisure Time Physical Health Social Support  Sleep:  Number of Hours: 6.75  Cognition: WNL  ADL's:  Intact   Mental Status Per Nursing Assessment::   On Admission:  Suicidal ideation indicated by others  Demographic Factors:  Male and Adolescent or young adult  Loss Factors: NA  Historical Factors: Impulsivity  Risk Reduction Factors:   Living with another person, especially a relative and Positive therapeutic relationship  Continued Clinical Symptoms:  Previous Psychiatric Diagnoses and Treatments  Cognitive Features That Contribute To Risk:  None    Suicide Risk:  Minimal: No identifiable suicidal ideation.  Patients presenting with no risk factors but with morbid ruminations; may be classified as minimal risk based on the severity of the depressive symptoms  Follow-up Information    [facility] Follow up on 01/04/2017.   Specialty:  Behavioral Health Why:  Tuesday at 1:20 with Josph Macho.  If you signed up for TCT services with Myles Gip, you can call him at [phone] to secure a ride for your hospital d/c appointment Contact information: [place] [address] [phone]           Plan Of Care/Follow-up recommendations:  Activity:  As tolerated Diet:  Regular Other:  Follow-up appointments and take your medications as prescribed  Hampton Abbot, MD 12/30/2016, 11:05 AM

## 2016-12-30 NOTE — Progress Notes (Signed)
Patient was discharged to home/selfcare.  Patient acknowledged understanding of all discharge instructions. Patient was eager to discharge and cheerful upon discharge.  Patient left the hospital unaccompanied with a plan to return to previous living situation.

## 2016-12-30 NOTE — BHH Suicide Risk Assessment (Signed)
[place] INPATIENT:  Family/Significant Other Suicide Prevention Education  Suicide Prevention Education:  Education Completed; Labron Bloodgood, mother, [phone]  has been identified by the patient as the family member/significant other with whom the patient will be residing, and identified as the person(s) who will aid the patient in the event of a mental health crisis (suicidal ideations/suicide attempt).  With written consent from the patient, the family member/significant other has been provided the following suicide prevention education, prior to the and/or following the discharge of the patient.  The suicide prevention education provided includes the following:  Suicide risk factors  Suicide prevention and interventions  National Suicide Hotline telephone number  [facility] assessment telephone number  [facility] Emergency Assistance [place] and/or Residential Mobile Crisis Unit telephone number  Request made of family/significant other to:  Remove weapons (e.g., guns, rifles, knives), all items previously/currently identified as safety concern.    Remove drugs/medications (over-the-counter, prescriptions, illicit drugs), all items previously/currently identified as a safety concern.  The family member/significant other verbalizes understanding of the suicide prevention education information provided.  The family member/significant other agrees to remove the items of safety concern listed above. Pt had previously given me wrong number.  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 She cited the fact that he had a friend who recently died as the precipitant for hospitalization.  She states he is getting better about going to appointments at [facility] and taking his meds regularly.  [name] 12/30/2016, 10:25 AM

## 2017-01-12 ENCOUNTER — Encounter (HOSPITAL_COMMUNITY): Payer: Self-pay | Admitting: Emergency Medicine

## 2017-01-12 DIAGNOSIS — F329 Major depressive disorder, single episode, unspecified: Secondary | ICD-10-CM | POA: Insufficient documentation

## 2017-01-12 DIAGNOSIS — Z87891 Personal history of nicotine dependence: Secondary | ICD-10-CM | POA: Diagnosis not present

## 2017-01-12 DIAGNOSIS — R440 Auditory hallucinations: Secondary | ICD-10-CM | POA: Diagnosis present

## 2017-01-12 DIAGNOSIS — R000 Tachycardia, unspecified: Secondary | ICD-10-CM | POA: Diagnosis not present

## 2017-01-12 DIAGNOSIS — F17210 Nicotine dependence, cigarettes, uncomplicated: Secondary | ICD-10-CM | POA: Diagnosis not present

## 2017-01-12 DIAGNOSIS — F1290 Cannabis use, unspecified, uncomplicated: Secondary | ICD-10-CM | POA: Diagnosis not present

## 2017-01-12 DIAGNOSIS — R443 Hallucinations, unspecified: Secondary | ICD-10-CM | POA: Diagnosis not present

## 2017-01-12 DIAGNOSIS — R441 Visual hallucinations: Secondary | ICD-10-CM | POA: Insufficient documentation

## 2017-01-12 DIAGNOSIS — Z79899 Other long term (current) drug therapy: Secondary | ICD-10-CM | POA: Diagnosis not present

## 2017-01-12 DIAGNOSIS — J45909 Unspecified asthma, uncomplicated: Secondary | ICD-10-CM | POA: Insufficient documentation

## 2017-01-12 DIAGNOSIS — F909 Attention-deficit hyperactivity disorder, unspecified type: Secondary | ICD-10-CM | POA: Insufficient documentation

## 2017-01-12 DIAGNOSIS — F250 Schizoaffective disorder, bipolar type: Secondary | ICD-10-CM | POA: Diagnosis not present

## 2017-01-12 LAB — COMPREHENSIVE METABOLIC PANEL
ALT: 27 U/L (ref 17–63)
AST: 25 U/L (ref 15–41)
Albumin: 4.3 g/dL (ref 3.5–5.0)
Alkaline Phosphatase: 94 U/L (ref 38–126)
Anion gap: 8 (ref 5–15)
BUN: 10 mg/dL (ref 6–20)
CO2: 24 mmol/L (ref 22–32)
Calcium: 9.4 mg/dL (ref 8.9–10.3)
Chloride: 110 mmol/L (ref 101–111)
Creatinine, Ser: 1.09 mg/dL (ref 0.61–1.24)
GFR calc Af Amer: >60 mL/min (ref >60)
GFR calc non Af Amer: >60 mL/min (ref >60)
Glucose, Bld: 95 mg/dL (ref 65–99)
Potassium: 3.7 mmol/L (ref 3.5–5.1)
Sodium: 142 mmol/L (ref 135–145)
Total Bilirubin: 0.6 mg/dL (ref 0.3–1.2)
Total Protein: 7 g/dL (ref 6.5–8.1)

## 2017-01-12 LAB — RAPID URINE DRUG SCREEN, HOSP PERFORMED
Amphetamines: NOT DETECTED
Barbiturates: NOT DETECTED
Benzodiazepines: NOT DETECTED
Cocaine: NOT DETECTED
Opiates: NOT DETECTED
Tetrahydrocannabinol: NOT DETECTED

## 2017-01-12 LAB — ETHANOL: Alcohol, Ethyl (B): <5 mg/dL (ref <5)

## 2017-01-12 LAB — CBC
HCT: 44.6 % (ref 39.0–52.0)
Hemoglobin: 14.9 g/dL (ref 13.0–17.0)
MCH: 27.8 pg (ref 26.0–34.0)
MCHC: 33.4 g/dL (ref 30.0–36.0)
MCV: 83.2 fL (ref 78.0–100.0)
Platelets: 287 10*3/uL (ref 150–400)
RBC: 5.36 MIL/uL (ref 4.22–5.81)
RDW: 12.1 % (ref 11.5–15.5)
WBC: 13.1 10*3/uL — ABNORMAL HIGH (ref 4.0–10.5)

## 2017-01-12 LAB — ACETAMINOPHEN LEVEL: Acetaminophen (Tylenol), Serum: <10 ug/mL — ABNORMAL LOW (ref 10–30)

## 2017-01-12 LAB — SALICYLATE LEVEL: Salicylate Lvl: <7 mg/dL (ref 2.8–30.0)

## 2017-01-12 LAB — LITHIUM LEVEL: Lithium Lvl: 0.57 mmol/L — ABNORMAL LOW (ref 0.60–1.20)

## 2017-01-12 NOTE — ED Triage Notes (Signed)
Pt presents with thoughts/ideas of harming self and others; pt states he believes he is lucifer and eats souls; pt has hallicinations visual and auditory regarding the same; pt states he is a very dark person and his friends do not see his beliefs in the same way he does and they recommended he seek treatment; pt is very calm, cooperative, and offering information freely; pt states he is here voluntarily and requesting long term treatment [facility]

## 2017-01-12 NOTE — ED Notes (Signed)
Pt changed into wine colored scrubs, wanded by security, called staffing for SI sitter, pt took night time doses of lithium, abilify, and vistaril

## 2017-01-13 ENCOUNTER — Emergency Department (HOSPITAL_COMMUNITY)
Admission: EM | Admit: 2017-01-13 | Discharge: 2017-01-15 | Disposition: A | Discharge status: Home / Self Care | Payer: Medicaid Other | Attending: Emergency Medicine | Admitting: Emergency Medicine

## 2017-01-13 DIAGNOSIS — R45851 Suicidal ideations: Secondary | ICD-10-CM

## 2017-01-13 DIAGNOSIS — R443 Hallucinations, unspecified: Secondary | ICD-10-CM

## 2017-01-13 MED ORDER — LITHIUM CARBONATE 300 MG PO CAPS
600.0000 mg | ORAL_CAPSULE | Freq: Two times a day (BID) | ORAL | Status: DC
  Administered 2017-01-13 – 2017-01-15 (×6): 600 mg via ORAL
  Filled 2017-01-13 (×7): qty 2

## 2017-01-13 MED ORDER — ARIPIPRAZOLE 10 MG PO TABS
10.0000 mg | ORAL_TABLET | Freq: Every day | ORAL | Status: DC
  Administered 2017-01-13 – 2017-01-14 (×2): 10 mg via ORAL
  Filled 2017-01-13 (×2): qty 1

## 2017-01-13 MED ORDER — NICOTINE 21 MG/24HR TD PT24
21.0000 mg | MEDICATED_PATCH | Freq: Every day | TRANSDERMAL | Status: DC
  Administered 2017-01-14 – 2017-01-15 (×2): 21 mg via TRANSDERMAL
  Filled 2017-01-13 (×2): qty 1

## 2017-01-13 MED ORDER — TRAZODONE HCL 50 MG PO TABS
50.0000 mg | ORAL_TABLET | Freq: Every evening | ORAL | Status: DC | PRN
  Administered 2017-01-13 – 2017-01-14 (×2): 50 mg via ORAL
  Filled 2017-01-13 (×2): qty 1

## 2017-01-13 MED ORDER — HYDROXYZINE HCL 25 MG PO TABS: 25.0000 mg | ORAL_TABLET | Freq: Three times a day (TID) | ORAL | Status: DC | PRN

## 2017-01-13 NOTE — ED Notes (Signed)
Patient eating lunch at this time.

## 2017-01-13 NOTE — ED Notes (Signed)
Linens changed, patient taking shower, given clean scrubs.

## 2017-01-13 NOTE — ED Notes (Signed)
Pt shown list from his bookbag found in his notebook, pt pulled out page and tore it into pieces, pieces taped back together,  items on list such as pipe cutters, sandwich bags, muratic acid, sudafed, drano crystals, cold packs, tubing, batteries, salt zippo, naptha. Marcus from TTS made aware, list shown to [employer].

## 2017-01-13 NOTE — ED Notes (Signed)
Patient Belongings is in [place] [address].

## 2017-01-13 NOTE — ED Notes (Addendum)
Pt states that he is [name] and he eats souls. Pt states that he lives with two of his friends and he got into a argument with them about this and is unsure if he can return to his home. Pt states that he believes he can eat souls and he feels both SI and HI, pt states he attempted to hang himself today but was found by him room mates, pt also states that six days ago he attempted to run out in front of cars and hang himself. When questioned pt if he is still having SI thoughts, pt started looking around the room for objects. Pt states he feels HI towards anyone because he is the devil and that he has full conversations with spirits. States he has been taking his medication regularly.Pt requesting [facility] and asking to be IVC.

## 2017-01-13 NOTE — ED Notes (Signed)
Delivered home meds to pharmacy with count sheet in the chart.

## 2017-01-13 NOTE — Progress Notes (Signed)
Pt meets criteria for inpatient admission; CSW faxed referral packet to the following facilities in attempts to secure inpatient bed:    [facility] [place] [facility]  TTS will continue to seek placement.   Adriana Reams, [place] Work [phone]

## 2017-01-13 NOTE — ED Notes (Signed)
Regular Diet ordered for patient.

## 2017-01-13 NOTE — ED Notes (Signed)
Regular Diet was ordered for Dinner. 

## 2017-01-13 NOTE — ED Notes (Signed)
Pt assessed, calm and oriented, wanted staff to know "i am a draconian reptilian vampire and I eat souls". PA at bedside.

## 2017-01-13 NOTE — ED Provider Notes (Signed)
[facility] DEPT Provider Note   CSN: [id number] Arrival date & time: 01/12/17  2214     History   Chief Complaint Chief Complaint  Patient presents with  . Hallucinations  . Suicidal    HPI Justin Chaney is a 21 y.o. male.  Patient presents emergency department with chief complaint of hallucinations and suicidal and homicidal ideations. Patient states that "I am [name] and [employer]."  He states that he is also a vampire.  He states that he tried to kill himself today, but couldn't get the rope tight enough to hang himself.  He states that he also feels violent towards others.  He denies any fever, chills, CP, SOB, n/v/d.  He denies any other symptoms.  There are no modifying factors.   The history is provided by the patient. No language interpreter was used.    Past Medical History:  Diagnosis Date  . Asthma   . Bipolar disorder ([place])   . 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 Depression   . Homelessness   . Schizophrenia ([place])   . Seizures ([place])    childhood seizures    Patient Active Problem List   Diagnosis Date Noted  . Cocaine use disorder, severe, in early remission ([place]) 12/21/2016  . Amphetamine abuse in remission 12/21/2016  . ADHD (attention deficit hyperactivity disorder) 12/21/2016  . 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 Tobacco use disorder 12/21/2016  . Tobacco use 10/06/2016  . Homicidal ideations 09/16/2016  . Suicidal ideation   . Hallucinations   . Homicidal ideation   . Suicidal thoughts   . Cannabis use disorder, moderate, in sustained remission ([place]) 11/07/2015  . Depression 10/29/2015  . Intermittent explosive disorder 09/05/2015  . Schizoaffective disorder, bipolar type ([place]) 09/04/2015  . Malingering 06/05/2015  . Antisocial personality disorder 06/03/2015  . Cocaine abuse 06/03/2015    Past Surgical History:  Procedure Laterality Date  . TONSILLECTOMY    . VENTRICULO-PERITONEAL SHUNT PLACEMENT /  LAPAROSCOPIC INSERTION PERITONEAL CATHETER         Home Medications    Prior to Admission medications   Medication Sig Start Date End Date Taking? Authorizing Provider  ARIPiprazole (ABILIFY) 10 MG tablet Take 1 tablet (10 mg total) by mouth at bedtime. For depression 12/30/16   Hughie Closs A, NP  hydrOXYzine (ATARAX/VISTARIL) 25 MG tablet Take 1 tablet (25 mg total) by mouth 3 (three) times daily as needed for anxiety. 12/30/16   Hughie Closs A, NP  lithium carbonate 600 MG capsule Take 1 capsule (600 mgs) in the morning and at supper: For mood stabilization 12/30/16   Okonkwo, Justina A, NP  nicotine (NICODERM CQ - DOSED IN MG/24 HOURS) 21 mg/24hr patch Place 1 patch (21 mg total) onto the skin daily. For smoking ceasation 12/31/16   Hughie Closs A, NP  traZODone (DESYREL) 50 MG tablet Take 1 tablet (50 mg total) by mouth at bedtime as needed for sleep. 12/30/16   Vicenta Aly, NP    Family History Family History  Problem Relation Age of Onset  . Hypertension Mother   . Mental illness Neg Hx     Social History Social History  Substance Use Topics  . Smoking status: Former Smoker    Packs/day: 0.50    Types: Cigarettes    Quit date: 10/22/2015  . Smokeless tobacco: Never Used  . Alcohol use No     Allergies   Amoxicillin and Penicillins   Review of Systems Review  of Systems  All other systems reviewed and are negative.    Physical Exam Updated Vital Signs BP 113/66 (BP Location: Left Arm)   Pulse (!) 120   Temp 97.9 F (36.6 C) (Oral)   Resp 18   Ht 5\' 3"  (1.6 m)   Wt 110.7 kg (244 lb)   SpO2 97%   BMI 43.22 kg/m   Physical Exam  Constitutional: He is oriented to person, place, and time. He appears well-developed and well-nourished.  HENT:  Head: Normocephalic and atraumatic.  Eyes: Conjunctivae and EOM are normal. Pupils are equal, round, and reactive to light. Right eye exhibits no discharge. Left eye exhibits no discharge. No scleral  icterus.  Neck: Normal range of motion. Neck supple. No JVD present.  Cardiovascular: Regular rhythm and normal heart sounds.  Exam reveals no gallop and no friction rub.   No murmur heard. tachycardic  Pulmonary/Chest: Effort normal and breath sounds normal. No respiratory distress. He has no wheezes. He has no rales. He exhibits no tenderness.  Abdominal: Soft. He exhibits no distension and no mass. There is no tenderness. There is no rebound and no guarding.  Musculoskeletal: Normal range of motion. He exhibits no edema or tenderness.  Neurological: He is alert and oriented to person, place, and time.  Skin: Skin is warm and dry.  Psychiatric: He has a normal mood and affect. His behavior is normal. Judgment and thought content normal.  Nursing note and vitals reviewed.    ED Treatments / Results  Labs (all labs ordered are listed, but only abnormal results are displayed) Labs Reviewed  ACETAMINOPHEN LEVEL - Abnormal; Notable for the following:       Result Value   Acetaminophen (Tylenol), Serum <10 (*)    All other components within normal limits  CBC - Abnormal; Notable for the following:    WBC 13.1 (*)    All other components within normal limits  LITHIUM LEVEL - Abnormal; Notable for the following:    Lithium Lvl 0.57 (*)    All other components within normal limits  COMPREHENSIVE METABOLIC PANEL  ETHANOL  SALICYLATE LEVEL  RAPID URINE DRUG SCREEN, HOSP PERFORMED    EKG  EKG Interpretation None       Radiology No results found.  Procedures Procedures (including critical care time)  Medications Ordered in ED Medications - No data to display   Initial Impression / Assessment and Plan / ED Course  I have reviewed the triage vital signs and the nursing notes.  Pertinent labs & imaging results that were available during my care of the patient were reviewed by me and considered in my medical decision making (see chart for details).     Patient with  hallucinations, suicidal ideations and homicidal ideations.  Here voluntarily right now.  Will IVC if needed.  Calm and cooperating.  TTS consult.  TTS recommends inpatient treatment. Working on finding a facility.  Final Clinical Impressions(s) / ED Diagnoses   Final diagnoses:  Suicidal ideation  Hallucinations    New Prescriptions New Prescriptions   No medications on file     Montine Circle, Hershal Coria 01/13/17 0109    Gareth Morgan, MD 01/13/17 1721

## 2017-01-13 NOTE — ED Notes (Signed)
Sitter ambulating pt to shower room.
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
## 2017-01-13 NOTE — ED Notes (Signed)
TTS in progress 

## 2017-01-13 NOTE — ED Notes (Signed)
Breakfast tray ordered 

## 2017-01-14 NOTE — ED Notes (Signed)
Pt ambulating around pod with sitter

## 2017-01-14 NOTE — ED Notes (Signed)
Pt resting in bed quietly, sitter at bedside.

## 2017-01-14 NOTE — ED Notes (Signed)
Pt ate all of dinner

## 2017-01-14 NOTE — ED Notes (Signed)
Pt had shower

## 2017-01-14 NOTE — BHH Counselor (Addendum)
Reassessment:  Patient reported continuing to have SI, with a plan to harm himself with "any random thing."  Patient stated having HI with the identified victim of "anybody."  Patient was unable to contract for safety.  Patient reported having AVH of angels and demons. During assessment, Patient was alert and cooperative.  Patient was oriented to the time, place, location, and situation.  Patient reported wanting to receive inpatient treatment.    Per Otila Kluver, NP Patient continues to meet inpatient criteria  Leroy Sea, [place] Therapeutic Triage Specialist [phone]

## 2017-01-14 NOTE — ED Notes (Signed)
Pt reports being tired of sitting in same spot, pt taken on a walk with sitter around the pod. Assisted back into room, cooperative and calm, sitter at bedside

## 2017-01-14 NOTE — BHH Counselor (Deleted)
Followed up with [facility] in [place] to see if pt has been reviewed. Per Jasmine pt "does not meet criteria for admission" and has been declined. Pt was tele-psyched by Otila Kluver NP who is continuing to recommend inpatient admission.   [address] [place], LCAS

## 2017-01-14 NOTE — ED Notes (Signed)
TTS at bedside. 

## 2017-01-14 NOTE — ED Notes (Signed)
Pt asked to take another walk, sitter and patient ambulating around pod.

## 2017-01-15 DIAGNOSIS — R443 Hallucinations, unspecified: Secondary | ICD-10-CM | POA: Diagnosis not present

## 2017-01-15 DIAGNOSIS — F1290 Cannabis use, unspecified, uncomplicated: Secondary | ICD-10-CM

## 2017-01-15 DIAGNOSIS — F17210 Nicotine dependence, cigarettes, uncomplicated: Secondary | ICD-10-CM | POA: Diagnosis not present

## 2017-01-15 DIAGNOSIS — F1190 Opioid use, unspecified, uncomplicated: Secondary | ICD-10-CM | POA: Diagnosis not present

## 2017-01-15 DIAGNOSIS — F250 Schizoaffective disorder, bipolar type: Secondary | ICD-10-CM

## 2017-01-15 NOTE — ED Notes (Signed)
Pt up to desk inquiring as to whether or not we have found placement.  This RN informed him that as of right now, we have not.

## 2017-01-15 NOTE — BH Assessment (Signed)
The patient denies SI, HI and A/V. Reports last thoughts occurred yesterday morning. When this clinician brought up the severity of his initial symptoms and questioned these thoughts dissipating overnight, the patient states he is tired of sitting there. This clinician will inform SW and provider on call.

## 2017-01-15 NOTE — Discharge Instructions (Signed)
Follow up for out pt treatment as instructed by behavior health

## 2017-01-15 NOTE — ED Notes (Signed)
Pt updated about his plan of care to be discharged. Pt verbalized understanding.

## 2017-01-15 NOTE — ED Notes (Signed)
Pt up to desk inquiring about how much it would be to take a train ([employer]) from here to [place].  When asked why he was up worrying about this, pt replied "It's just something that's going around in my head."  This RN looked this up for the patient in hopes it would stop bothering him and he would sleep.  FYI: From [place] to [place] is a little over $1200.  From [facility] to [facility] is a little over $1800.  Pt was shocked by these numbers and stated, "Well, never mind, I guess I'm not going there."  Pt ambulated back to room.

## 2017-01-15 NOTE — ED Notes (Signed)
Pt calmly watching TV. Pt has inquired about plan of care. Dinner tray ordered.

## 2017-01-15 NOTE — ED Triage Notes (Signed)
Second TTS in progress.

## 2017-01-15 NOTE — Consult Note (Signed)
Telepsych Consultation   Reason for Consult: Consult for Justin Chaney, a 21 y.o. male admitted to the ED for suicide ideations and psychosis. Referring Physician: EDP Patient Identification: Justin Chaney MRN:  [id number] Principal Diagnosis: <principal problem not specified> Diagnosis:   Patient Active Problem List   Diagnosis Date Noted  . Cocaine use disorder, severe, in early remission ([place]) [F14.21] 12/21/2016  . 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 ADHD (attention deficit hyperactivity disorder) [F90.9] 12/21/2016  . 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 Tobacco use disorder [F17.200] 12/21/2016  . Tobacco use [Z72.0] 10/06/2016  . Homicidal ideations [R45.850] 09/16/2016  . Suicidal ideation [R45.851]   . Hallucinations [R44.3]   . Homicidal ideation [R45.850]   . Suicidal thoughts [R45.851]   . Cannabis use disorder, moderate, in sustained remission ([place]) [F12.21] 11/07/2015  . Depression [F32.9] 10/29/2015  . Intermittent explosive disorder [F63.81] 09/05/2015  . Schizoaffective disorder, bipolar type ([place]) [F25.0] 09/04/2015  . Malingering [Z76.5] 06/05/2015  . Antisocial personality disorder [F60.2] 06/03/2015  . Cocaine abuse [F14.10] 06/03/2015    Total Time spent with patient: 30 minutes  Subjective:   Justin Chaney is a 21 y.o. male patient admitted with c/o auditory and visual hallucinations.  HPI: Per the assessment completed on 01/13/17 by Curlene Dolphin: Justin Chaney is an 21 y.o. male.  -Clinician reviewed note by Montine Circle, PA.  Patient presents emergency department with chief complaint of hallucinations and suicidal and homicidal ideations. Patient states that "I am [name] and the [employer]." He states that he is also a vampire. He states that he tried to kill himself today, but couldn't get the rope tight enough to hang himself. He states that he also feels violent towards others.   Patient was discharged from  [facility] on 12/30/16.  He said that there have been no changes to medications since discharge.  Patient also says he is taking medications as prescribed.  Patient denies any ETOH or illicit drug use.    Patient says he lives with two housemates.  He and housmates were talking tonight and he told them that he was a "draconion reptilian vampire who eats souls."  He also told them about his being [name].  Patient was brought to [facility] by those very same housemates.  Patient says that also on 06/13 he got a rope to hang himself but he said the rope was not strong enough.  Patient also had tried to step into traffic on [address] about 6 days ago.  Patient has some thoughts of harming others but no intention or plan to do so.  Patient is hearing voices of angels and demons.  Also sees them.  Patient has delusional thinking, I.e.vampire and [name] identities.  -Clinician spoke with Patriciaann Clan, PA who recommends inpatient care.  Clinician spoke with Montine Circle, PA and informed him that TTS will pursue inpatient care for patient.  Diagnosis: Schizophrenia; Bipolar d/o.  On my assessment today: Patient was seen, chart reviewed with treatment team. Patient was sitting up in bed, awake, alert and oriented x4. Patient reported that the reason he was back to the hospital was because his friends told him to go seek inpatient because he (patient) believes that he is a vampire and he eats souls. Patient stated that at the time of this hospital admission, that he was having hallucinations and suicide ideations, but not anymore. Patient reports that he still believes he is a vampire and and that he eats souls. Justin Chaney has been eating souls  for a long time but won't tell us whose souls he eats. Patient said he never stopped taking his medications when he got home but he didn't follow up with his appointment. Said the only reason he is here is again because his friends thinks he needs a long term treatment. Patient  kept insisting that his friends that he lives with wants him to go the hospital for placement. Patient started requesting to be discharged today if we cannot get him a long-term placement. Then he stated that there are some people that go the hospital more often than him. Patient reports good sleep and appetite, he denies any SI/HI/VAH. Patient does not seem to be responding to internal stimuli at this time.   Past Psychiatric History: -Bipolar disorder -Depression -Homelessness -Schizophrenia -Seizures (childhood)  Risk to Self: Suicidal Ideation: Yes-Currently Present Suicidal Intent: Yes-Currently Present Is patient at risk for suicide?: Yes Suicidal Plan?: Yes-Currently Present Specify Current Suicidal Plan: Hang self Access to Means: Yes Specify Access to Suicidal Means: Rope What has been your use of drugs/alcohol within the last 12 months?: Pt says none this episode How many times?:  (Multiple times) Other Self Harm Risks: Hx of cutting Triggers for Past Attempts: Unpredictable Intentional Self Injurious Behavior: None Risk to Others: Homicidal Ideation: Yes-Currently Present Thoughts of Harm to Others: Yes-Currently Present Comment - Thoughts of Harm to Others: Passing thougths of harming others. Current Homicidal Intent: No Current Homicidal Plan: No Access to Homicidal Means: No Identified Victim: No one in particular History of harm to others?: No Assessment of Violence: None Noted Violent Behavior Description: Pt cannot remembe Does patient have access to weapons?: Yes (Comment) (Knives but no guns) Criminal Charges Pending?: No Does patient have a court date: No Prior Inpatient Therapy: Prior Inpatient Therapy: Yes Prior Therapy Dates: 2 months ago; 11/2016 Prior Therapy Facilty/Provider(s): Audry Riles, [facility] Reason for Treatment: psychosis Prior Outpatient Therapy: Prior Outpatient Therapy: Yes Prior Therapy Dates: Currrent Prior Therapy Facilty/Provider(s):  [facility] Reason for Treatment: med management Does patient have an ACCT team?: No Does patient have Intensive In-House Services?  : No Does patient have [facility] services? : Yes Does patient have P4[facility] services?: No  Past Medical History:  Past Medical History:  Diagnosis Date  . Asthma   . Bipolar disorder ([place])   . 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 Depression   . Homelessness   . Schizophrenia ([place])   . Seizures ([place])    childhood seizures    Past Surgical History:  Procedure Laterality Date  . TONSILLECTOMY    . VENTRICULO-PERITONEAL SHUNT PLACEMENT / LAPAROSCOPIC INSERTION PERITONEAL CATHETER     Family History:  Family History  Problem Relation Age of Onset  . Hypertension Mother   . Mental illness Neg Hx    Family Psychiatric  History:   Social History:  History  Alcohol Use No     History  Drug Use  . Types: Marijuana, Other-see comments, Amphetamines    Comment: Adderall, "I don't do weed, only rarely"    Social History   Social History  . Marital status: Single    Spouse name: N/A  . Number of children: N/A  . Years of education: N/A   Social History Main Topics  . Smoking status: Former Smoker    Packs/day: 0.50    Types: Cigarettes    Quit date: 10/22/2015  . Smokeless tobacco: Never Used  . Alcohol use No  . Drug use: Yes  Types: Marijuana, Other-see comments, Amphetamines     Comment: Adderall, "I don't do weed, only rarely"  . Sexual activity: Not Asked   Other Topics Concern  . None   Social History Narrative  . None   Additional Social History:    Allergies:   Allergies  Allergen Reactions  . Amoxicillin Hives and Other (See Comments)    Has patient had a PCN reaction causing immediate rash, facial/tongue/throat swelling, SOB or lightheadedness with hypotension: No Has patient had a PCN reaction causing severe rash involving mucus membranes or skin necrosis: No Has patient had a PCN reaction that  required hospitalization No Has patient had a PCN reaction occurring within the last 10 years: No If all of the above answers are "NO", then may proceed with Cephalosporin use.  Marland Kitchen Penicillins Hives and Other (See Comments)    Has patient had a PCN reaction causing immediate rash, facial/tongue/throat swelling, SOB or lightheadedness with hypotension: No Has patient had a PCN reaction causing severe rash involving mucus membranes or skin necrosis: No Has patient had a PCN reaction that required hospitalization No Has patient had a PCN reaction occurring within the last 10 years: No If all of the above answers are "NO", then may proceed with Cephalosporin use.    Labs: No results found for this or any previous visit (from the past 48 hour(s)).  Current Facility-Administered Medications  Medication Dose Route Frequency Provider Last Rate Last Dose  . ARIPiprazole (ABILIFY) tablet 10 mg  10 mg Oral QHS Gareth Morgan, MD   10 mg at 01/14/17 2107  . hydrOXYzine (ATARAX/VISTARIL) tablet 25 mg  25 mg Oral TID PRN Gareth Morgan, MD      . lithium carbonate capsule 600 mg  600 mg Oral BID WC Gareth Morgan, MD   600 mg at 01/15/17 0851  . nicotine (NICODERM CQ - dosed in mg/24 hours) patch 21 mg  21 mg Transdermal Daily Gareth Morgan, MD   21 mg at 01/14/17 1052  . traZODone (DESYREL) tablet 50 mg  50 mg Oral QHS PRN Gareth Morgan, MD   50 mg at 01/14/17 2107   Current Outpatient Prescriptions  Medication Sig Dispense Refill  . ARIPiprazole (ABILIFY) 10 MG tablet Take 1 tablet (10 mg total) by mouth at bedtime. For depression 30 tablet 0  . hydrOXYzine (ATARAX/VISTARIL) 25 MG tablet Take 1 tablet (25 mg total) by mouth 3 (three) times daily as needed for anxiety. 60 tablet 0  . lithium carbonate 600 MG capsule Take 1 capsule (600 mgs) in the morning and at supper: For mood stabilization 60 capsule 0  . nicotine (NICODERM CQ - DOSED IN MG/24 HOURS) 21 mg/24hr patch Place 1 patch (21 mg  total) onto the skin daily. For smoking ceasation 28 patch 0  . traZODone (DESYREL) 50 MG tablet Take 1 tablet (50 mg total) by mouth at bedtime as needed for sleep. 30 tablet 0    Musculoskeletal: UTA via camera.  Psychiatric Specialty Exam: Physical Exam  Nursing note and vitals reviewed.   Review of Systems  Psychiatric/Behavioral: Negative for depression, hallucinations, memory loss, substance abuse and suicidal ideas. The patient is not nervous/anxious and does not have insomnia.   All other systems reviewed and are negative.   Blood pressure 113/71, pulse 94, temperature 98.4 F (36.9 C), temperature source Oral, resp. rate 18, height 5\' 3"  (1.6 m), weight 110.7 kg (244 lb), SpO2 97 %.Body mass index is 43.22 kg/m.  General Appearance: patient wearing hospital scrub  sitting in bed.  Eye Contact:  Good  Speech:  Clear and Coherent and Normal Rate  Volume:  Normal  Mood:  ambivalent mood  Affect:  Appropriate, Congruent and angry  Thought Process:  Coherent and Goal Directed  Orientation:  Full (Time, Place, and Person)  Thought Content:  WDL, Logical and Paranoid Ideation  Suicidal Thoughts:  No  Homicidal Thoughts:  No  Memory:  Immediate;   Good Recent;   Good Remote;   Fair  Judgement:  Intact  Insight:  Present  Psychomotor Activity:  Normal  Concentration:  Concentration: Good and Attention Span: Good  Recall:  Good  Fund of Knowledge:  Good  Language:  Good  Akathisia:  Negative  Handed:  Right  AIMS (if indicated):     Assets:  Communication Skills Desire for Improvement Housing Leisure Time Physical Health Resilience  ADL's:  Intact  Cognition:  WNL  Sleep:        Treatment Plan Summary: Plan to discharge patient per patient request as patient is not meeting inpatient criteria.  Disposition: No evidence of imminent risk to self or others at present.   Patient does not meet criteria for psychiatric inpatient admission. Supportive therapy provided  about ongoing stressors. Refer to IOP. Discussed crisis plan, support from social network, calling 911, coming to the Emergency Department, and calling Suicide Hotline.  Vicenta Aly, NP 01/15/2017 10:32 AM

## 2017-01-15 NOTE — ED Triage Notes (Signed)
Pt to POD -C for shower.

## 2017-01-15 NOTE — ED Notes (Signed)
Afternoon snack and water provided

## 2017-01-15 NOTE — ED Notes (Signed)
Spoke with [facility] about patient's request to be re-evaluated and sent home. To evaluate and update.

## 2017-02-09 IMAGING — CT CT MAXILLOFACIAL W/O CM
3 of 5 series · 14 of 47 positions shown, 16 images · non-contrast
Comparison: None.

EXAM:
CT HEAD WITHOUT CONTRAST

CT MAXILLOFACIAL WITHOUT CONTRAST
TECHNIQUE: Multidetector CT imaging of the head and maxillofacial structures
were performed using the standard protocol without intravenous
contrast. Multiplanar CT image reconstructions of the maxillofacial
structures were also generated.

[Series 3: facial st · axial · 0.35mm/px · z∈[-156,-16]mm · 8 of 87 slices shown, 10 images]
[im 11/87  brain]
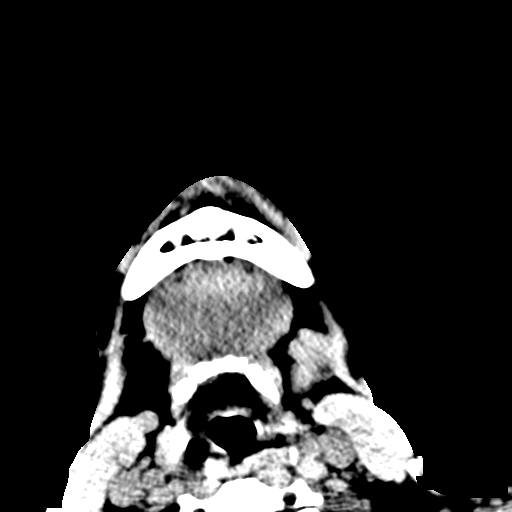
[im 11/87  bone]
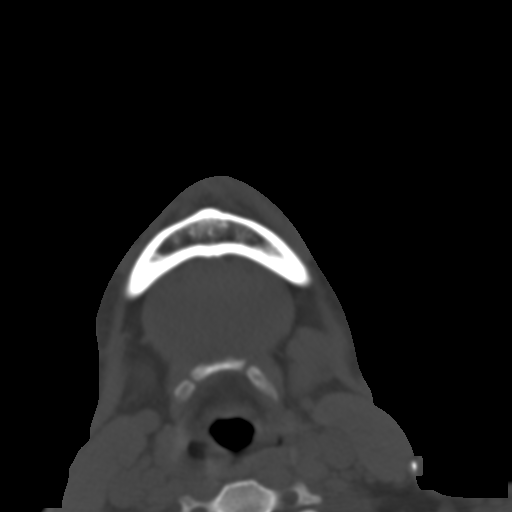
[im 21/87  bone]
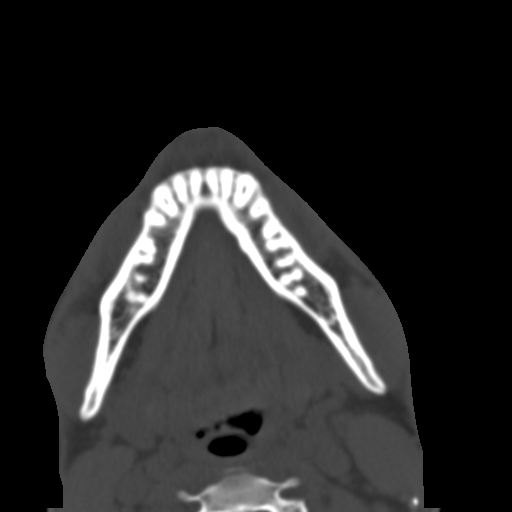
[im 31/87  bone]
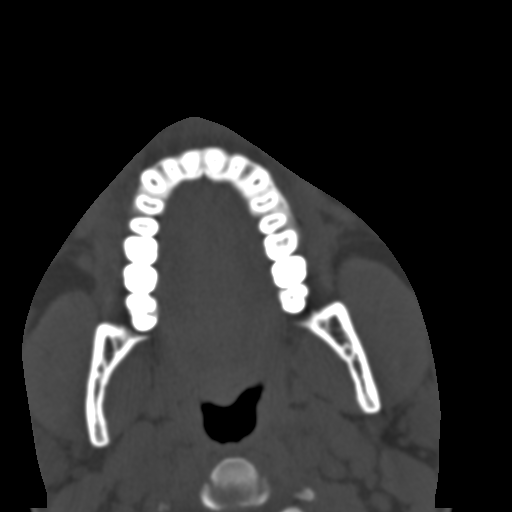
[im 41/87  bone]
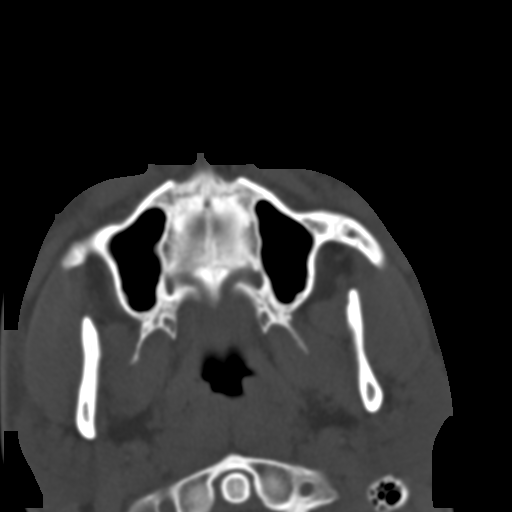
[im 51/87  brain]
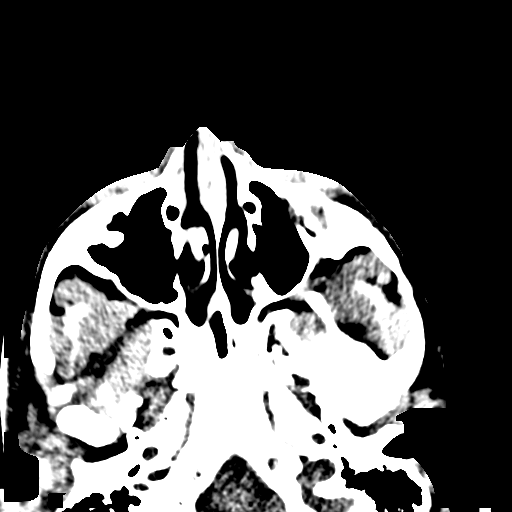
[im 51/87  bone]
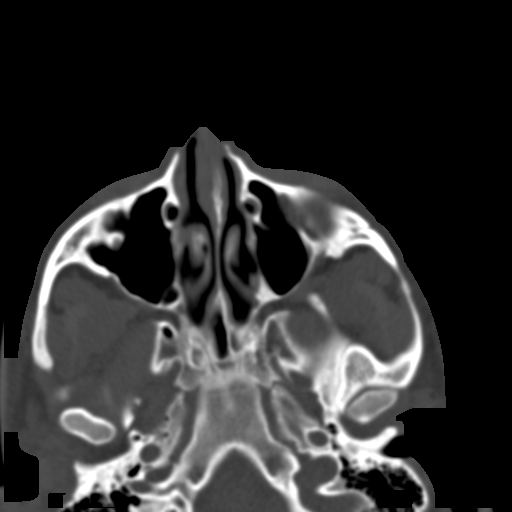
[im 61/87  bone]
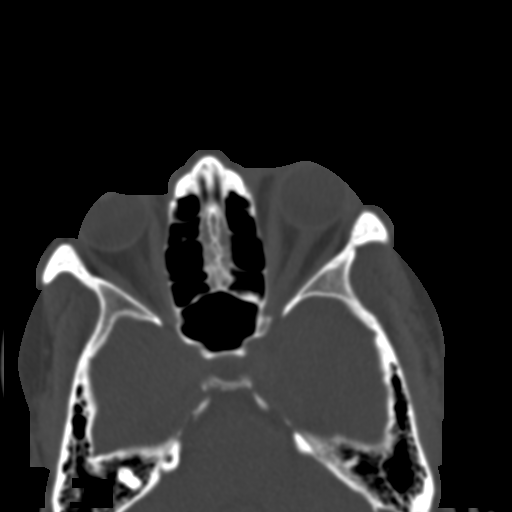
[im 71/87  bone]
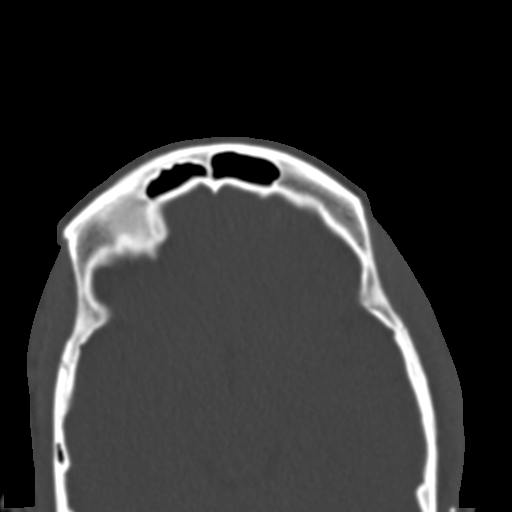
[im 81/87  bone]
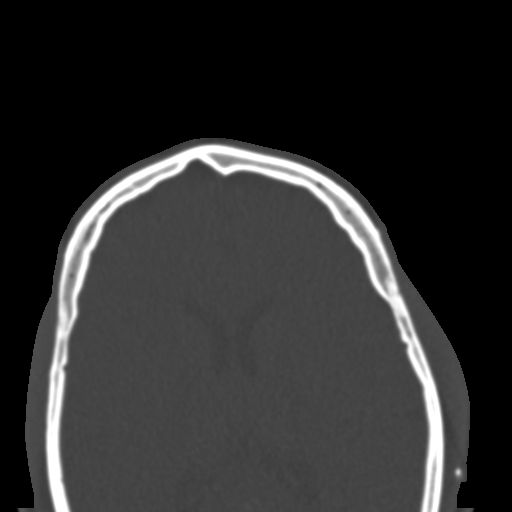

[Series 5: coronal st · coronal · 0.34mm/px · 3 of 89 slices shown]
[im 30/89  bone]
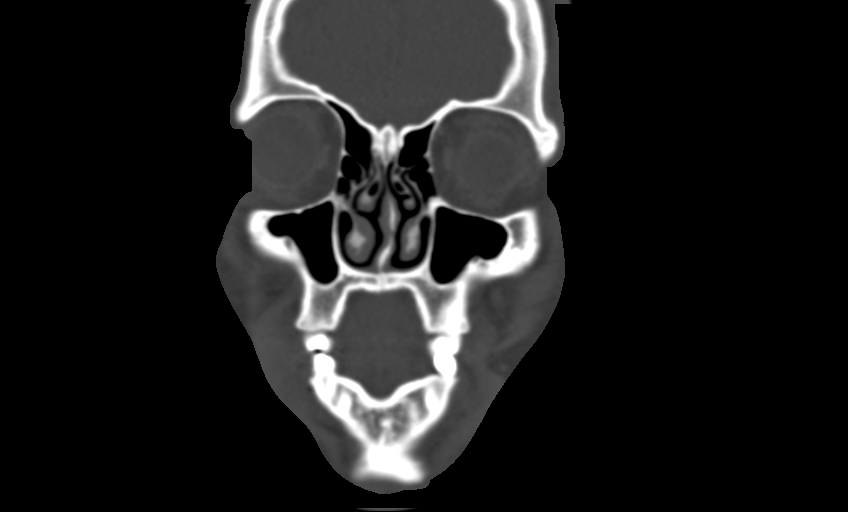
[im 40/89  bone]
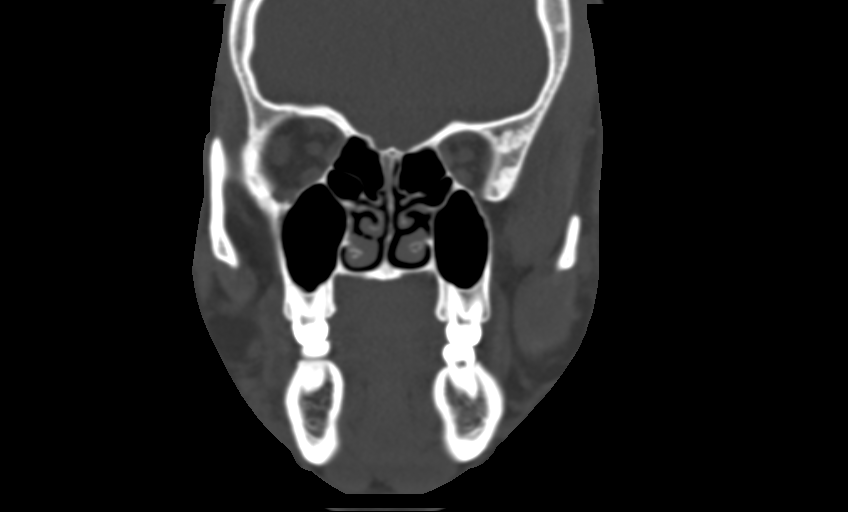
[im 49/89  bone]
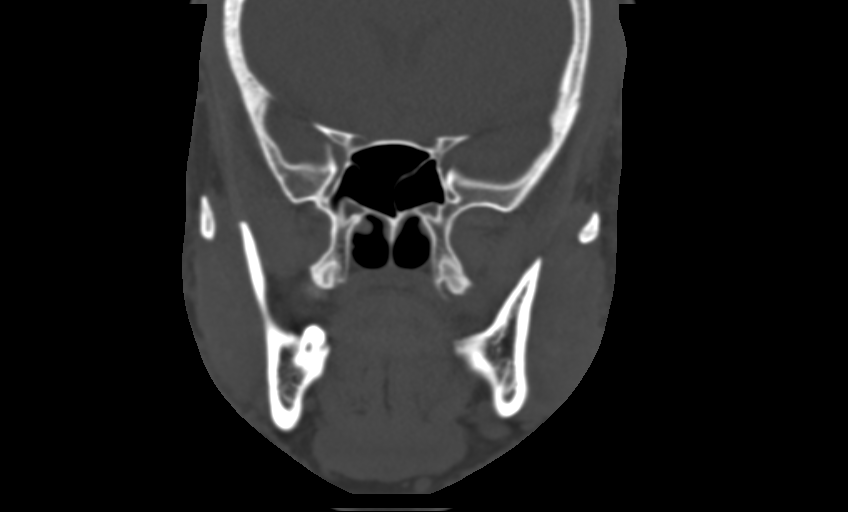

[Series 6: sagittal st · sagittal · 0.34mm/px · 3 of 76 slices shown]
[im 26/76  bone]
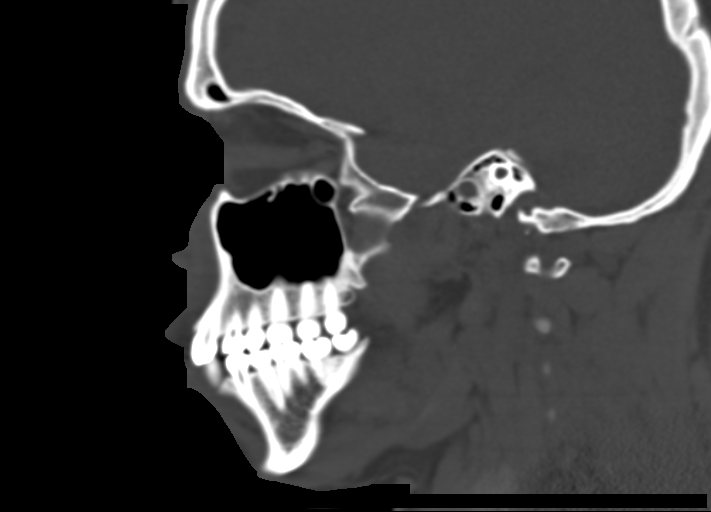
[im 38/76  bone]
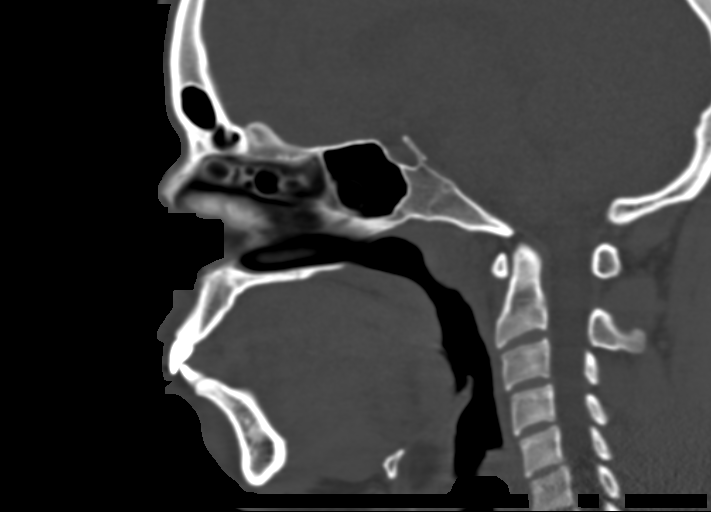
[im 51/76  bone]
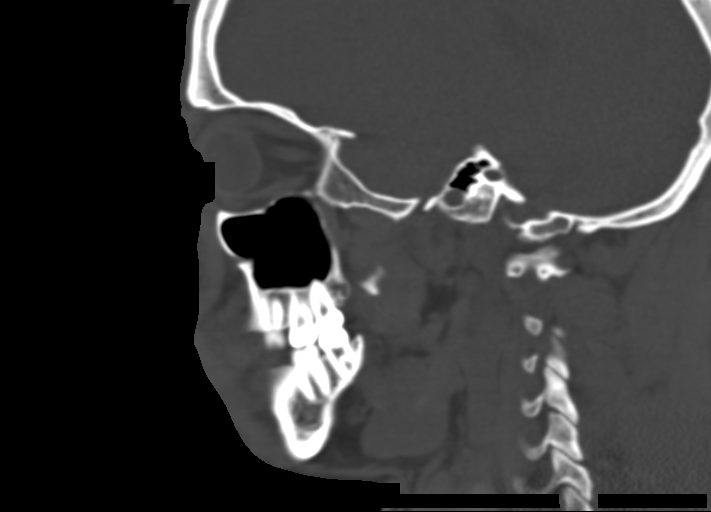

[14 of 47 positions shown; findings below may reference images not displayed]

FINDINGS: CT HEAD FINDINGS

There is no acute intracranial hemorrhage or infarct. No mass lesion
or midline shift. Gray-white matter differentiation is well
maintained. Ventricles are normal in size without evidence of
hydrocephalus. CSF containing spaces are within normal limits. No
extra-axial fluid collection.

Orbital soft tissues are within normal limits.

The paranasal sinuses and mastoid air cells are well pneumatized and
free of fluid.

Catheter tube Arnav in the left scalp with left frontal burr hole
again noted. Calvarium intact. No new scalp soft tissue abnormality.

CT MAXILLOFACIAL FINDINGS

Mild soft tissue swelling within the left face. Globes intact. No
retro-orbital hematoma or other pathology. Bony orbits intact
without evidence of orbital floor fracture.

Zygomatic arches intact. No maxillary fracture. Pterygoid plates
intact. Minimal irregularity about the nasal bones is stable from
previous. Nasal septum intact.

Mandible intact. Mandibular condyles normally situated within the
temporomandibular fossa.

Paranasal sinuses are clear.
IMPRESSION: CT HEAD:

No acute intracranial process.

CT MAXILLOFACIAL:

1. No acute maxillofacial fracture.
2. Mild left facial contusion.

## 2017-06-22 ENCOUNTER — Encounter (HOSPITAL_COMMUNITY): Payer: Self-pay | Admitting: Family Medicine

## 2017-06-22 ENCOUNTER — Emergency Department (HOSPITAL_COMMUNITY)
Admission: EM | Admit: 2017-06-22 | Discharge: 2017-06-24 | Disposition: A | Discharge status: Home / Self Care | Payer: Medicaid Other | Attending: Emergency Medicine | Admitting: Emergency Medicine

## 2017-06-22 DIAGNOSIS — F250 Schizoaffective disorder, bipolar type: Secondary | ICD-10-CM | POA: Diagnosis not present

## 2017-06-22 DIAGNOSIS — Z87891 Personal history of nicotine dependence: Secondary | ICD-10-CM | POA: Diagnosis not present

## 2017-06-22 DIAGNOSIS — Z046 Encounter for general psychiatric examination, requested by authority: Secondary | ICD-10-CM | POA: Insufficient documentation

## 2017-06-22 DIAGNOSIS — R441 Visual hallucinations: Secondary | ICD-10-CM | POA: Insufficient documentation

## 2017-06-22 DIAGNOSIS — J45909 Unspecified asthma, uncomplicated: Secondary | ICD-10-CM | POA: Diagnosis not present

## 2017-06-22 DIAGNOSIS — R45850 Homicidal ideations: Secondary | ICD-10-CM | POA: Insufficient documentation

## 2017-06-22 DIAGNOSIS — F419 Anxiety disorder, unspecified: Secondary | ICD-10-CM | POA: Diagnosis not present

## 2017-06-22 DIAGNOSIS — Z79899 Other long term (current) drug therapy: Secondary | ICD-10-CM | POA: Insufficient documentation

## 2017-06-22 DIAGNOSIS — R4587 Impulsiveness: Secondary | ICD-10-CM | POA: Diagnosis not present

## 2017-06-22 DIAGNOSIS — R440 Auditory hallucinations: Secondary | ICD-10-CM | POA: Diagnosis present

## 2017-06-22 DIAGNOSIS — R443 Hallucinations, unspecified: Secondary | ICD-10-CM

## 2017-06-22 DIAGNOSIS — G4700 Insomnia, unspecified: Secondary | ICD-10-CM | POA: Diagnosis not present

## 2017-06-22 DIAGNOSIS — R451 Restlessness and agitation: Secondary | ICD-10-CM | POA: Diagnosis not present

## 2017-06-22 LAB — LITHIUM LEVEL: Lithium Lvl: 0.22 mmol/L — ABNORMAL LOW (ref 0.60–1.20)

## 2017-06-22 LAB — CBC
HCT: 43.7 % (ref 39.0–52.0)
Hemoglobin: 15 g/dL (ref 13.0–17.0)
MCH: 28.6 pg (ref 26.0–34.0)
MCHC: 34.3 g/dL (ref 30.0–36.0)
MCV: 83.2 fL (ref 78.0–100.0)
Platelets: 259 10*3/uL (ref 150–400)
RBC: 5.25 MIL/uL (ref 4.22–5.81)
RDW: 12.5 % (ref 11.5–15.5)
WBC: 10.3 10*3/uL (ref 4.0–10.5)

## 2017-06-22 LAB — ETHANOL: Alcohol, Ethyl (B): <10 mg/dL (ref <10)

## 2017-06-22 LAB — COMPREHENSIVE METABOLIC PANEL
ALT: 49 U/L (ref 17–63)
AST: 33 U/L (ref 15–41)
Albumin: 4.2 g/dL (ref 3.5–5.0)
Alkaline Phosphatase: 102 U/L (ref 38–126)
Anion gap: 7 (ref 5–15)
BUN: 12 mg/dL (ref 6–20)
CO2: 25 mmol/L (ref 22–32)
Calcium: 8.9 mg/dL (ref 8.9–10.3)
Chloride: 103 mmol/L (ref 101–111)
Creatinine, Ser: 0.96 mg/dL (ref 0.61–1.24)
GFR calc Af Amer: >60 mL/min (ref >60)
GFR calc non Af Amer: >60 mL/min (ref >60)
Glucose, Bld: 113 mg/dL — ABNORMAL HIGH (ref 65–99)
Potassium: 3.6 mmol/L (ref 3.5–5.1)
Sodium: 135 mmol/L (ref 135–145)
Total Bilirubin: 0.6 mg/dL (ref 0.3–1.2)
Total Protein: 7.3 g/dL (ref 6.5–8.1)

## 2017-06-22 LAB — RAPID URINE DRUG SCREEN, HOSP PERFORMED
Amphetamines: NOT DETECTED
Barbiturates: NOT DETECTED
Benzodiazepines: NOT DETECTED
Cocaine: NOT DETECTED
Opiates: NOT DETECTED
Tetrahydrocannabinol: NOT DETECTED

## 2017-06-22 LAB — ACETAMINOPHEN LEVEL: Acetaminophen (Tylenol), Serum: <10 ug/mL — ABNORMAL LOW (ref 10–30)

## 2017-06-22 LAB — SALICYLATE LEVEL: Salicylate Lvl: <7 mg/dL (ref 2.8–30.0)

## 2017-06-22 MED ORDER — ARIPIPRAZOLE 10 MG PO TABS
10.0000 mg | ORAL_TABLET | Freq: Every day | ORAL | Status: DC
  Administered 2017-06-23: 10 mg via ORAL
  Filled 2017-06-22: qty 1

## 2017-06-22 MED ORDER — RISPERIDONE 1 MG PO TBDP: 2.0000 mg | ORAL_TABLET | Freq: Three times a day (TID) | ORAL | Status: DC | PRN

## 2017-06-22 MED ORDER — TRAZODONE HCL 50 MG PO TABS
50.0000 mg | ORAL_TABLET | Freq: Every evening | ORAL | Status: DC | PRN
  Administered 2017-06-23: 50 mg via ORAL
  Filled 2017-06-22: qty 1

## 2017-06-22 MED ORDER — HYDROXYZINE HCL 25 MG PO TABS: 25.0000 mg | ORAL_TABLET | Freq: Three times a day (TID) | ORAL | Status: DC | PRN

## 2017-06-22 MED ORDER — ACETAMINOPHEN 325 MG PO TABS: 650.0000 mg | ORAL_TABLET | ORAL | Status: DC | PRN

## 2017-06-22 MED ORDER — ALUM & MAG HYDROXIDE-SIMETH 200-200-20 MG/5ML PO SUSP: 30.0000 mL | Freq: Four times a day (QID) | ORAL | Status: DC | PRN

## 2017-06-22 MED ORDER — NICOTINE 21 MG/24HR TD PT24: 21.0000 mg | MEDICATED_PATCH | Freq: Every day | TRANSDERMAL | Status: DC

## 2017-06-22 MED ORDER — ONDANSETRON HCL 4 MG PO TABS: 4.0000 mg | ORAL_TABLET | Freq: Three times a day (TID) | ORAL | Status: DC | PRN

## 2017-06-22 MED ORDER — LORAZEPAM 1 MG PO TABS: 1.0000 mg | ORAL_TABLET | ORAL | Status: DC | PRN

## 2017-06-22 MED ORDER — LITHIUM CARBONATE 300 MG PO CAPS
600.0000 mg | ORAL_CAPSULE | Freq: Two times a day (BID) | ORAL | Status: DC
  Administered 2017-06-23 – 2017-06-24 (×3): 600 mg via ORAL
  Filled 2017-06-22 (×3): qty 2

## 2017-06-22 NOTE — ED Notes (Signed)
Provided patient two sandwiches, 2 cheese sticks, a cup of coke, and a cup of water.

## 2017-06-22 NOTE — ED Triage Notes (Signed)
Patient was at [employer], called for homicidal and suicidal thoughts. Patient reports he is hearting voices, visualizing demons and angels, and experiencing a spiritual warfare within himself. Officers report he has been calm and cooperative. However, he was verbal about stating "I am Jesus, I am the Devil", "I hear voices to kill people".

## 2017-06-22 NOTE — ED Notes (Signed)
TTS at bedside. 
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
## 2017-06-22 NOTE — ED Notes (Signed)
Belongings: Pink Bookbag and white belongings bag. Placed in locker 26.

## 2017-06-22 NOTE — ED Provider Notes (Signed)
[facility] DEPT Provider Note   CSN: [id number] Arrival date & time: 06/22/17  1921     History   Chief Complaint Chief Complaint  Patient presents with  . Psychiatric Evaluation    HPI Justin Chaney is a 21 y.o. male.  HPI  21 year old male with a history of schizophrenia and a VP shunt presents with hallucinations.  He states this is a chronic problem.  It has been gradually worsening.  He hears voices and also has thoughts in his head of torturing and killing people.  He is not suicidal this time.  He denies drug use but his chart review shows that he has had a prior history of amphetamine and cocaine abuse.  He is here voluntarily wanting help and admission.  He states he has been compliant with his medicines.  Denies alcohol abuse.  No headaches, vomiting, dizziness.  No fevers.  He feels like he is merging with the devil.  Past Medical History:  Diagnosis Date  . Asthma   . Bipolar disorder ([place])   . 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 Depression   . Homelessness   . Schizophrenia ([place])   . Seizures ([place])    childhood seizures    Patient Active Problem List   Diagnosis Date Noted  . Cocaine use disorder, severe, in early remission ([place]) 12/21/2016  . 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 ADHD (attention deficit hyperactivity disorder) 12/21/2016  . 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 Tobacco use disorder 12/21/2016  . Tobacco use 10/06/2016  . Homicidal ideations 09/16/2016  . Suicidal ideation   . Hallucinations   . Homicidal ideation   . Suicidal thoughts   . Cannabis use disorder, moderate, in sustained remission ([place]) 11/07/2015  . Depression 10/29/2015  . Intermittent explosive disorder 09/05/2015  . Schizoaffective disorder, bipolar type ([place]) 09/04/2015  . Malingering 06/05/2015  . Antisocial personality disorder ([place]) 06/03/2015  . Cocaine abuse ([place]) 06/03/2015    Past Surgical  History:  Procedure Laterality Date  . TONSILLECTOMY    . VENTRICULO-PERITONEAL SHUNT PLACEMENT / LAPAROSCOPIC INSERTION PERITONEAL CATHETER         Home Medications    Prior to Admission medications   Medication Sig Start Date End Date Taking? Authorizing Provider  ARIPiprazole (ABILIFY) 10 MG tablet Take 1 tablet (10 mg total) by mouth at bedtime. For depression 12/30/16   Hughie Closs A, NP  hydrOXYzine (ATARAX/VISTARIL) 25 MG tablet Take 1 tablet (25 mg total) by mouth 3 (three) times daily as needed for anxiety. 12/30/16   Hughie Closs A, NP  lithium carbonate 600 MG capsule Take 1 capsule (600 mgs) in the morning and at supper: For mood stabilization 12/30/16   Okonkwo, Justina A, NP  nicotine (NICODERM CQ - DOSED IN MG/24 HOURS) 21 mg/24hr patch Place 1 patch (21 mg total) onto the skin daily. For smoking ceasation 12/31/16   Hughie Closs A, NP  traZODone (DESYREL) 50 MG tablet Take 1 tablet (50 mg total) by mouth at bedtime as needed for sleep. 12/30/16   Vicenta Aly, NP    Family History Family History  Problem Relation Age of Onset  . Hypertension Mother   . Mental illness Neg Hx     Social History Social History   Tobacco Use  . Smoking status: Former Smoker    Packs/day: 0.50    Types: Cigarettes    Last attempt to quit: 10/22/2015    Years since quitting:  1.6  . Smokeless tobacco: Never Used  Substance Use Topics  . Alcohol use: No  . Drug use: No    Comment: History of use     Allergies   Amoxicillin and Penicillins   Review of Systems Review of Systems  Constitutional: Negative for fever.  Gastrointestinal: Negative for vomiting.  Neurological: Negative for headaches.  Psychiatric/Behavioral: Positive for hallucinations. Negative for self-injury and suicidal ideas.  All other systems reviewed and are negative.    Physical Exam Updated Vital Signs BP 128/60 (BP Location: Right Arm)   Pulse 94   Temp 98.3 F (36.8 C) (Oral)    Resp 20   Ht 5\' 3"  (1.6 m)   Wt 107 kg (236 lb)   SpO2 100%   BMI 41.81 kg/m   Physical Exam  Constitutional: He is oriented to person, place, and time. He appears well-developed and well-nourished. No distress.  HENT:  Head: Normocephalic and atraumatic.  Right Ear: External ear normal.  Left Ear: External ear normal.  Nose: Nose normal.  Eyes: Right eye exhibits no discharge. Left eye exhibits no discharge.  Neck: Neck supple.  Cardiovascular: Normal rate, regular rhythm and normal heart sounds.  Pulmonary/Chest: Effort normal and breath sounds normal.  Abdominal: Soft. There is no tenderness.  Musculoskeletal: He exhibits no edema.  Neurological: He is alert and oriented to person, place, and time.  Skin: Skin is warm and dry. He is not diaphoretic.  Psychiatric: He is not actively hallucinating. He expresses homicidal ideation. He expresses no suicidal ideation. He expresses homicidal plans.  Nursing note and vitals reviewed.    ED Treatments / Results  Labs (all labs ordered are listed, but only abnormal results are displayed) Labs Reviewed  COMPREHENSIVE METABOLIC PANEL - Abnormal; Notable for the following components:      Result Value   Glucose, Bld 113 (*)    All other components within normal limits  ACETAMINOPHEN LEVEL - Abnormal; Notable for the following components:   Acetaminophen (Tylenol), Serum <10 (*)    All other components within normal limits  ETHANOL  SALICYLATE LEVEL  CBC  RAPID URINE DRUG SCREEN, HOSP PERFORMED  LITHIUM LEVEL    EKG  EKG Interpretation None       Radiology No results found.  Procedures Procedures (including critical care time)  Medications Ordered in ED Medications  ARIPiprazole (ABILIFY) tablet 10 mg (10 mg Oral Refused 06/22/17 2201)  hydrOXYzine (ATARAX/VISTARIL) tablet 25 mg (not administered)  lithium carbonate capsule 600 mg (not administered)  nicotine (NICODERM CQ - dosed in mg/24 hours) patch 21 mg (21 mg  Transdermal Refused 06/22/17 2201)  traZODone (DESYREL) tablet 50 mg (not administered)  ondansetron (ZOFRAN) tablet 4 mg (not administered)  alum & mag hydroxide-simeth (MAALOX/MYLANTA) 200-200-20 MG/5ML suspension 30 mL (not administered)  risperiDONE (RISPERDAL M-TABS) disintegrating tablet 2 mg (not administered)    And  LORazepam (ATIVAN) tablet 1 mg (not administered)  acetaminophen (TYLENOL) tablet 650 mg (not administered)     Initial Impression / Assessment and Plan / ED Course  I have reviewed the triage vital signs and the nursing notes.  Pertinent labs & imaging results that were available during my care of the patient were reviewed by me and considered in my medical decision making (see chart for details).     Patient's vital signs are unremarkable.  He appears medically stable for psychiatric admission and disposition.  TTS has seen patient and he is meeting the criteria for inpatient admission.  They are  looking for placement. Voluntary  Final Clinical Impressions(s) / ED Diagnoses   Final diagnoses:  Hallucinations    ED Discharge Orders    None       Sherwood Gambler, MD 06/22/17 2203

## 2017-06-23 DIAGNOSIS — R451 Restlessness and agitation: Secondary | ICD-10-CM | POA: Diagnosis not present

## 2017-06-23 DIAGNOSIS — R45850 Homicidal ideations: Secondary | ICD-10-CM | POA: Diagnosis not present

## 2017-06-23 DIAGNOSIS — Z87891 Personal history of nicotine dependence: Secondary | ICD-10-CM | POA: Diagnosis not present

## 2017-06-23 DIAGNOSIS — R443 Hallucinations, unspecified: Secondary | ICD-10-CM | POA: Diagnosis not present

## 2017-06-23 DIAGNOSIS — F250 Schizoaffective disorder, bipolar type: Secondary | ICD-10-CM | POA: Diagnosis not present

## 2017-06-23 DIAGNOSIS — R4587 Impulsiveness: Secondary | ICD-10-CM | POA: Diagnosis not present

## 2017-06-23 NOTE — BH Assessment (Addendum)
[facility] Assessment Progress Note  Per Corena Pilgrim, MD, this pt requires psychiatric hospitalization at this time.  The following facilities have been contacted to seek placement for this pt, with results as noted:  Beds available, information sent, decision pending:  [facility]:  [facility] (due to pt acuity) [facility] (due to pt acuity)   At capacity:  [facility] [place], [place] Triage Specialist [phone]

## 2017-06-23 NOTE — BHH Counselor (Signed)
Clinician has been accepted to [facility] in [place], [place] by [place]. Pt has been assigned to [facility], and can come anytime tomorrow. Attending physician: Dr. Mila Homer. Nursing report: [phone]. Updated disposition discussed with Claiborne Billings, RN.   Vertell Novak, MS, [facility], [facility] Triage Specialist [phone]

## 2017-06-23 NOTE — ED Triage Notes (Signed)
Pt is in his room watching television at this hour.
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
## 2017-06-23 NOTE — ED Notes (Signed)
Care transferred to [place], [place] for [facility] 36. Patient was wanded by security. Patient was given a coke in which he drunk all of it. Used the restroom prior to ambulating to room 36. Belongings have been given to [employer] staff.

## 2017-06-23 NOTE — Consult Note (Signed)
[facility] Face-to-Face Psychiatry Consult   Reason for Consult:  Psychosis and homicidal thoughts Referring Physician:  EDP Patient Identification: Justin Chaney MRN:  [id number] Principal Diagnosis: Schizoaffective disorder, bipolar type [facility]) Diagnosis:   Patient Active Problem List   Diagnosis Date Noted  . Intermittent explosive disorder [F63.81] 09/05/2015    Priority: High  . Schizoaffective disorder, bipolar type ([place]) [F25.0] 09/04/2015    Priority: High  . Cocaine use disorder, severe, in early remission ([place]) [F14.21] 12/21/2016  . 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 ADHD (attention deficit hyperactivity disorder) [F90.9] 12/21/2016  . 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 Tobacco use disorder [F17.200] 12/21/2016  . Tobacco use [Z72.0] 10/06/2016  . Homicidal ideations [R45.850] 09/16/2016  . Suicidal ideation [R45.851]   . Hallucinations [R44.3]   . Homicidal ideation [R45.850]   . Suicidal thoughts [R45.851]   . Cannabis use disorder, moderate, in sustained remission ([place]) [F12.21] 11/07/2015  . Depression [F32.9] 10/29/2015  . Malingering [Z76.5] 06/05/2015  . Antisocial personality disorder ([place]) [F60.2] 06/03/2015  . Cocaine abuse ([place]) [F14.10] 06/03/2015    Total Time spent with patient: 45 minutes  Subjective:   Justin Chaney is a 21 y.o. male patient admitted with homicidal thoughts and psychosis.  HPI:  Patient with history of Schizoaffective disorder-Bipolar type and multiple inpatient admissions in the past. He is endorsing auditory/visual hallucinations and homicidal thoughts. Patient reports that he has been seeing demons and angels. He reports recurrent homicidal thoughts towards men, women and children with plan to torture them. Patient believes he is [name] and has been engaging in spiritual warfare with demons and angels. Patient denies suicidal ideation and auditory hallucinations.   Past Psychiatric History: as  above  Risk to Self: Suicidal Ideation: No(Pt denies. ) Suicidal Intent: No Is patient at risk for suicide?: No Suicidal Plan?: No Access to Means: No What has been your use of drugs/alcohol within the last 12 months?: Cigarettes.  How many times?: (Pt reported, "I don't know." ) Other Self Harm Risks: Pt denies.  Triggers for Past Attempts: Unknown Intentional Self Injurious Behavior: None(Pt denies.) Risk to Others: Homicidal Ideation: Yes-Currently Present Thoughts of Harm to Others: Yes-Currently Present Comment - Thoughts of Harm to Others: Pt reported, he is homicidal towards men, women and children.  Current Homicidal Intent: Yes-Currently Present Current Homicidal Plan: No Access to Homicidal Means: No(Pt denies. ) Identified Victim: Pt reported, men, women and children. History of harm to others?: No(Pt denies. ) Assessment of Violence: None Noted Violent Behavior Description: Pt denies.  Does patient have access to weapons?: No(Pt denies. ) Criminal Charges Pending?: No Does patient have a court date: No Prior Inpatient Therapy: Prior Inpatient Therapy: Yes Prior Therapy Dates: Pt reported, "it's been a while."  Prior Therapy Facilty/Provider(s): [place], [place], [place], [place], hospital in [place].  Reason for Treatment: Pt reported, anger issues and SI.  Prior Outpatient Therapy: Prior Outpatient Therapy: Yes Prior Therapy Dates: Current Prior Therapy Facilty/Provider(s): [facility] Reason for Treatment: Medication management. Does patient have an ACCT team?: No Does patient have Intensive In-House Services?  : No Does patient have [facility] services? : Yes Does patient have P4CC services?: No  Past Medical History:  Past Medical History:  Diagnosis Date  . Asthma   . Bipolar disorder ([place])   . 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 Depression   . Homelessness   . Schizophrenia ([place])   . Seizures ([place])  childhood seizures    Past Surgical History:  Procedure Laterality Date  . TONSILLECTOMY    . VENTRICULO-PERITONEAL SHUNT PLACEMENT / LAPAROSCOPIC INSERTION PERITONEAL CATHETER     Family History:  Family History  Problem Relation Age of Onset  . Hypertension Mother   . Mental illness Neg Hx    Family Psychiatric  History:  Social History:  Social History   Substance and Sexual Activity  Alcohol Use No     Social History   Substance and Sexual Activity  Drug Use No   Comment: History of use    Social History   Socioeconomic History  . Marital status: Single    Spouse name: None  . Number of children: None  . Years of education: None  . Highest education level: None  Social Needs  . Financial resource strain: None  . Food insecurity - worry: None  . Food insecurity - inability: None  . Transportation needs - medical: None  . Transportation needs - non-medical: None  Occupational History  . None  Tobacco Use  . Smoking status: Former Smoker    Packs/day: 0.50    Types: Cigarettes    Last attempt to quit: 10/22/2015    Years since quitting: 1.6  . Smokeless tobacco: Never Used  Substance and Sexual Activity  . Alcohol use: No  . Drug use: No    Comment: History of use  . Sexual activity: None  Other Topics Concern  . None  Social History Narrative  . None   Additional Social History:    Allergies:   Allergies  Allergen Reactions  . Amoxicillin Hives and Other (See Comments)    Has patient had a PCN reaction causing immediate rash, facial/tongue/throat swelling, SOB or lightheadedness with hypotension: No Has patient had a PCN reaction causing severe rash involving mucus membranes or skin necrosis: No Has patient had a PCN reaction that required hospitalization No Has patient had a PCN reaction occurring within the last 10 years: No If all of the above answers are "NO", then may proceed with Cephalosporin use.  Marland Kitchen Penicillins Hives and Other (See  Comments)    Has patient had a PCN reaction causing immediate rash, facial/tongue/throat swelling, SOB or lightheadedness with hypotension: No Has patient had a PCN reaction causing severe rash involving mucus membranes or skin necrosis: No Has patient had a PCN reaction that required hospitalization No Has patient had a PCN reaction occurring within the last 10 years: No If all of the above answers are "NO", then may proceed with Cephalosporin use.    Labs:  Results for orders placed or performed during the hospital encounter of 06/22/17 (from the past 48 hour(s))  Comprehensive metabolic panel     Status: Abnormal   Collection Time: 06/22/17  8:05 PM  Result Value Ref Range   Sodium 135 135 - 145 mmol/L   Potassium 3.6 3.5 - 5.1 mmol/L   Chloride 103 101 - 111 mmol/L   CO2 25 22 - 32 mmol/L   Glucose, Bld 113 (H) 65 - 99 mg/dL   BUN 12 6 - 20 mg/dL   Creatinine, Ser 0.96 0.61 - 1.24 mg/dL   Calcium 8.9 8.9 - 10.3 mg/dL   Total Protein 7.3 6.5 - 8.1 g/dL   Albumin 4.2 3.5 - 5.0 g/dL   AST 33 15 - 41 U/L   ALT 49 17 - 63 U/L   Alkaline Phosphatase 102 38 - 126 U/L   Total Bilirubin 0.6 0.3 - 1.2  mg/dL   GFR calc non Af Amer >60 >60 mL/min   GFR calc Af Amer >60 >60 mL/min    Comment: (NOTE) The eGFR has been calculated using the CKD EPI equation. This calculation has not been validated in all clinical situations. eGFR's persistently <60 mL/min signify possible Chronic Kidney Disease.    Anion gap 7 5 - 15  Ethanol     Status: None   Collection Time: 06/22/17  8:05 PM  Result Value Ref Range   Alcohol, Ethyl (B) <10 <10 mg/dL    Comment:        LOWEST DETECTABLE LIMIT FOR SERUM ALCOHOL IS 10 mg/dL FOR MEDICAL PURPOSES ONLY   Salicylate level     Status: None   Collection Time: 06/22/17  8:05 PM  Result Value Ref Range   Salicylate Lvl <6.5 2.8 - 30.0 mg/dL  Acetaminophen level     Status: Abnormal   Collection Time: 06/22/17  8:05 PM  Result Value Ref Range    Acetaminophen (Tylenol), Serum <10 (L) 10 - 30 ug/mL    Comment:        THERAPEUTIC CONCENTRATIONS VARY SIGNIFICANTLY. A RANGE OF 10-30 ug/mL MAY BE AN EFFECTIVE CONCENTRATION FOR MANY PATIENTS. HOWEVER, SOME ARE BEST TREATED AT CONCENTRATIONS OUTSIDE THIS RANGE. ACETAMINOPHEN CONCENTRATIONS >150 ug/mL AT 4 HOURS AFTER INGESTION AND >50 ug/mL AT 12 HOURS AFTER INGESTION ARE OFTEN ASSOCIATED WITH TOXIC REACTIONS.   cbc     Status: None   Collection Time: 06/22/17  8:05 PM  Result Value Ref Range   WBC 10.3 4.0 - 10.5 K/uL   RBC 5.25 4.22 - 5.81 MIL/uL   Hemoglobin 15.0 13.0 - 17.0 g/dL   HCT 43.7 39.0 - 52.0 %   MCV 83.2 78.0 - 100.0 fL   MCH 28.6 26.0 - 34.0 pg   MCHC 34.3 30.0 - 36.0 g/dL   RDW 12.5 11.5 - 15.5 %   Platelets 259 150 - 400 K/uL  Rapid urine drug screen (hospital performed)     Status: None   Collection Time: 06/22/17  8:05 PM  Result Value Ref Range   Opiates NONE DETECTED NONE DETECTED   Cocaine NONE DETECTED NONE DETECTED   Benzodiazepines NONE DETECTED NONE DETECTED   Amphetamines NONE DETECTED NONE DETECTED   Tetrahydrocannabinol NONE DETECTED NONE DETECTED   Barbiturates NONE DETECTED NONE DETECTED    Comment:        DRUG SCREEN FOR MEDICAL PURPOSES ONLY.  IF CONFIRMATION IS NEEDED FOR ANY PURPOSE, NOTIFY LAB WITHIN 5 DAYS.        LOWEST DETECTABLE LIMITS FOR URINE DRUG SCREEN Drug Class       Cutoff (ng/mL) Amphetamine      1000 Barbiturate      200 Benzodiazepine   790 Tricyclics       383 Opiates          300 Cocaine          300 THC              50   Lithium level     Status: Abnormal   Collection Time: 06/22/17  8:09 PM  Result Value Ref Range   Lithium Lvl 0.22 (L) 0.60 - 1.20 mmol/L    Current Facility-Administered Medications  Medication Dose Route Frequency Provider Last Rate Last Dose  . acetaminophen (TYLENOL) tablet 650 mg  650 mg Oral Q4H PRN Sherwood Gambler, MD      . alum & mag hydroxide-simeth (MAALOX/MYLANTA)  200-200-20  MG/5ML suspension 30 mL  30 mL Oral Q6H PRN Sherwood Gambler, MD      . ARIPiprazole (ABILIFY) tablet 10 mg  10 mg Oral QHS Sherwood Gambler, MD      . hydrOXYzine (ATARAX/VISTARIL) tablet 25 mg  25 mg Oral TID PRN Sherwood Gambler, MD      . lithium carbonate capsule 600 mg  600 mg Oral BID WC Sherwood Gambler, MD   600 mg at 06/23/17 8119  . risperiDONE (RISPERDAL M-TABS) disintegrating tablet 2 mg  2 mg Oral Q8H PRN Sherwood Gambler, MD       And  . LORazepam (ATIVAN) tablet 1 mg  1 mg Oral PRN Sherwood Gambler, MD      . nicotine (NICODERM CQ - dosed in mg/24 hours) patch 21 mg  21 mg Transdermal Daily Sherwood Gambler, MD      . ondansetron [facility]) tablet 4 mg  4 mg Oral Q8H PRN Sherwood Gambler, MD      . traZODone (DESYREL) tablet 50 mg  50 mg Oral QHS PRN Sherwood Gambler, MD       Current Outpatient Medications  Medication Sig Dispense Refill  . ARIPiprazole (ABILIFY) 10 MG tablet Take 1 tablet (10 mg total) by mouth at bedtime. For depression 30 tablet 0  . hydrOXYzine (ATARAX/VISTARIL) 25 MG tablet Take 1 tablet (25 mg total) by mouth 3 (three) times daily as needed for anxiety. 60 tablet 0  . lithium carbonate 600 MG capsule Take 1 capsule (600 mgs) in the morning and at supper: For mood stabilization 60 capsule 0  . nicotine (NICODERM CQ - DOSED IN MG/24 HOURS) 21 mg/24hr patch Place 1 patch (21 mg total) onto the skin daily. For smoking ceasation 28 patch 0  . traZODone (DESYREL) 50 MG tablet Take 1 tablet (50 mg total) by mouth at bedtime as needed for sleep. 30 tablet 0    Musculoskeletal: Strength & Muscle Tone: within normal limits Gait & Station: normal Patient leans: N/A  Psychiatric Specialty Exam: Physical Exam  Psychiatric: His affect is labile. His speech is rapid and/or pressured. He is agitated, aggressive and actively hallucinating. Cognition and memory are normal. He expresses impulsivity. He expresses homicidal ideation.    Review of Systems   Constitutional: Negative.   HENT: Negative.   Eyes: Negative.   Respiratory: Negative.   Cardiovascular: Negative.   Gastrointestinal: Negative.   Genitourinary: Negative.   Musculoskeletal: Negative.   Skin: Negative.   Neurological: Negative.   Endo/Heme/Allergies: Negative.   Psychiatric/Behavioral: Positive for hallucinations.    Blood pressure (!) 115/54, pulse 74, temperature 97.7 F (36.5 C), temperature source Oral, resp. rate 20, height '5\' 3"'  (1.6 m), weight 107 kg (236 lb), SpO2 98 %.Body mass index is 41.81 kg/m.  General Appearance: Casual  Eye Contact:  Good  Speech:  Clear and Coherent  Volume:  Increased  Mood:  Irritable  Affect:  Labile  Thought Process:  Coherent  Orientation:  Full (Time, Place, and Person)  Thought Content:  Hallucinations: Visual  Suicidal Thoughts:  No  Homicidal Thoughts:  Yes.  with intent/plan  Memory:  Immediate;   Fair Recent;   Fair Remote;   Fair  Judgement:  Poor  Insight:  Shallow  Psychomotor Activity:  Increased and Restlessness  Concentration:  Concentration: Fair and Attention Span: Fair  Recall:  [employer] of Knowledge:  Fair  Language:  Fair  Akathisia:  No  Handed:  Right  AIMS (if indicated):  Assets:  Communication Skills  ADL's:  Intact  Cognition:  WNL  Sleep:   fair     Treatment Plan Summary: Daily contact with patient to assess and evaluate symptoms and progress in treatment and Medication management Continue Abilify 10 mg qhs and Lithium 600 mg bid for Schizoaffective Bipolar.  Disposition: Recommend psychiatric Inpatient admission when medically cleared.  Corena Pilgrim, MD 06/23/2017 10:21 AM

## 2017-06-23 NOTE — ED Notes (Signed)
Pt presents with AVH, hearing voices and seeing Demons and Angels.  SI, and HI also. Awake, alert & responsive, no distress noted,  Pt not forthcoming with information, stating he has answered the same questions over and over again.  Monitoring for safety, Q 15 min checks in effect.  Safety check for contraband completed, no items found.

## 2017-06-24 DIAGNOSIS — F419 Anxiety disorder, unspecified: Secondary | ICD-10-CM

## 2017-06-24 DIAGNOSIS — F250 Schizoaffective disorder, bipolar type: Secondary | ICD-10-CM | POA: Diagnosis not present

## 2017-06-24 DIAGNOSIS — G4700 Insomnia, unspecified: Secondary | ICD-10-CM | POA: Diagnosis not present

## 2017-06-24 DIAGNOSIS — Z87891 Personal history of nicotine dependence: Secondary | ICD-10-CM | POA: Diagnosis not present

## 2017-06-24 NOTE — ED Notes (Signed)
Bed: [facility] Expected date:  Expected time:  Means of arrival:  Comments: [place]

## 2017-06-24 NOTE — ED Notes (Signed)
Pt transferred from TCU, complaint of SI, HI and AVH. Seeing Demons and [place].  A&O x 3, no distress noted, calm & cooperative.  Monitoring for safety, Q 15 min checks in effect.

## 2017-06-24 NOTE — ED Notes (Signed)
Pt awake and showering this am. This RN was preparing to call report to [facility] however d/t distance of trip and shift changes with the transportation service, pt's transfer will need to be after 0700. Pt made aware and is agreeable to transport later this am. Md aware and in agreement with plan as well. Will continue to monitor.

## 2017-06-24 NOTE — BHH Suicide Risk Assessment (Signed)
Suicide Risk Assessment  Discharge Assessment   [facility] Discharge Suicide Risk Assessment   Principal Problem: Schizoaffective disorder, bipolar type [facility]) Discharge Diagnoses:  Patient Active Problem List   Diagnosis Date Noted  . Schizoaffective disorder, bipolar type ([place]) [F25.0] 09/04/2015    Priority: High  . Cocaine use disorder, severe, in early remission ([place]) [F14.21] 12/21/2016  . Amphetamine abuse in remission ([place]) [F15.11] 12/21/2016  . ADHD (attention deficit hyperactivity disorder) [F90.9] 12/21/2016  . 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 Tobacco use disorder [F17.200] 12/21/2016  . Tobacco use [Z72.0] 10/06/2016  . Homicidal ideations [R45.850] 09/16/2016  . Suicidal ideation [R45.851]   . Hallucinations [R44.3]   . Homicidal ideation [R45.850]   . Suicidal thoughts [R45.851]   . Cannabis use disorder, moderate, in sustained remission ([place]) [F12.21] 11/07/2015  . Depression [F32.9] 10/29/2015  . Intermittent explosive disorder [F63.81] 09/05/2015  . Malingering [Z76.5] 06/05/2015  . Antisocial personality disorder ([place]) [F60.2] 06/03/2015  . Cocaine abuse ([place]) [F14.10] 06/03/2015    Total Time spent with patient: 30 minutes  Musculoskeletal: Strength & Muscle Tone: within normal limits Gait & Station: normal Patient leans: N/A  Psychiatric Specialty Exam: Physical Exam  Constitutional: He is oriented to person, place, and time. He appears well-developed and well-nourished.  HENT:  Head: Normocephalic.  Neck: Normal range of motion.  Respiratory: Effort normal.  Musculoskeletal: Normal range of motion.  Neurological: He is alert and oriented to person, place, and time.  Psychiatric: He has a normal mood and affect. His speech is normal and behavior is normal. Judgment and thought content normal. Cognition and memory are normal.    Review of Systems  Constitutional: Negative.   HENT: Negative.   Eyes: Negative.   Respiratory: Negative.    Cardiovascular: Negative.   Gastrointestinal: Negative.   Genitourinary: Negative.   Musculoskeletal: Negative.   Skin: Negative.   Neurological: Negative.   Endo/Heme/Allergies: Negative.   Psychiatric/Behavioral: Negative.   All other systems reviewed and are negative.   Blood pressure (!) 115/47, pulse 82, temperature 98.4 F (36.9 C), temperature source Oral, resp. rate 18, height 5\' 3"  (1.6 m), weight 107 kg (236 lb), SpO2 95 %.Body mass index is 41.81 kg/m.  General Appearance: Casual  Eye Contact:  Good  Speech:  Clear and Coherent  Volume:  Normal  Mood:  Irritable  Affect:  Congruent  Thought Process:  Coherent  Orientation:  Full (Time, Place, and Person)  Thought Content:  WDL  Suicidal Thoughts:  No  Homicidal Thoughts:  No  Memory:  Immediate, good; recent, good; remote, good  Judgement:  Fair  Insight:  Fair  Psychomotor Activity:  WDL  Concentration:  Concentration and attention span:  Good  Recall:  Good  Fund of Knowledge:  Fair  Language: Good  Akathisia:  No  Handed:  Right  AIMS (if indicated):     Assets:  Communication Skills  ADL's:  Intact  Cognition:  WNL  Sleep:   good    Mental Status Per Nursing Assessment::   On Admission:   homicidal ideations and hallucinations  Demographic Factors:  Male and Adolescent or young adult  Loss Factors: NA  Historical Factors: NA  Risk Reduction Factors:   Sense of responsibility to family and Positive therapeutic relationship  Continued Clinical Symptoms:  Irritable   Cognitive Features That Contribute To Risk:  None    Suicide Risk:  Minimal: No identifiable suicidal ideation.  Patients presenting with no risk factors but with morbid ruminations; may be  classified as minimal risk based on the severity of the depressive symptoms    Plan Of Care/Follow-up recommendations:  Activity:  as tolerated Diet:  heart healthy diet  Edge Mauger, NP 06/24/2017, 10:01 AM

## 2017-06-24 NOTE — BHH Counselor (Signed)
Clinician asked the pt if he knew someone that could pick him up once he is discharged, from [place]. Clinician dicussed the approximate distance from [facility] to [facility]. Pt asked questions to the reason for the change. Clinician expressed to the pt that she herself is unsure. Per Justin Chaney, because the pt is voluntary he has to have returning transportion coordinated before nursing report is called. Clinician expressed to the pt that inpatient treatment will continue to be sought. Clinician discussed updated disposition with the pt's RN. TTS will continue to seek placement.   Justin Novak, MS, [facility], [facility] Triage Specialist [phone]

## 2017-06-24 NOTE — BH Assessment (Signed)
[place] Assessment Progress Note  Per Corena Pilgrim, MD, this pt does not require psychiatric hospitalization at this time.  Pt is to be discharged from [facility] with recommendation to continue treatment with [facility].  This has been included in pt's discharge instructions.  Pt's nurse, Darryll Capers, has been notified.  Jalene Mullet, [place] Triage Specialist [phone]

## 2017-06-24 NOTE — Discharge Instructions (Signed)
For your mental health needs, you are advised to continue treatment with [facility]: ° °     [facility] °     [address] °     [place], [place] [address] °     [phone]

## 2017-06-24 NOTE — BHH Counselor (Addendum)
Clinician received a call from [place] at [facility] asking if the pt has transportation home after he is discharged from treatment. Clinician spoke to the [facility] and noted the pt will have transport to [place] but will have to find transport home after he is discharged because he is voluntary. Clinician discussed the information with Alexander Bergeron and expressed she will touch basis with the pt to see if he could coordinate transport once he is discharged from treatment. Clinician to follow up with RN.   Vertell Novak, MS,  [facility], [name] Triage Specialist [phone]

## 2017-06-24 NOTE — Consult Note (Signed)
[facility] Face-to-Face Psychiatry Consult   Reason for Consult:  Psychosis and homicidal thoughts Referring Physician:  EDP Patient Identification: Justin Chaney MRN:  [id number] Principal Diagnosis: Schizoaffective disorder, bipolar type [facility]) Diagnosis:   Patient Active Problem List   Diagnosis Date Noted  . Schizoaffective disorder, bipolar type ([place]) [F25.0] 09/04/2015    Priority: High  . Cocaine use disorder, severe, in early remission ([place]) [F14.21] 12/21/2016  . 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 ADHD (attention deficit hyperactivity disorder) [F90.9] 12/21/2016  . 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 Tobacco use disorder [F17.200] 12/21/2016  . Tobacco use [Z72.0] 10/06/2016  . Homicidal ideations [R45.850] 09/16/2016  . Suicidal ideation [R45.851]   . Hallucinations [R44.3]   . Homicidal ideation [R45.850]   . Suicidal thoughts [R45.851]   . Cannabis use disorder, moderate, in sustained remission ([place]) [F12.21] 11/07/2015  . Depression [F32.9] 10/29/2015  . Intermittent explosive disorder [F63.81] 09/05/2015  . Malingering [Z76.5] 06/05/2015  . Antisocial personality disorder ([place]) [F60.2] 06/03/2015  . Cocaine abuse ([place]) [F14.10] 06/03/2015    Total Time spent with patient: 30 minutes  Subjective:   Justin Chaney is a 21 y.o. male patient is stable for discharge  HPI:  Patient with history of Schizoaffective disorder-Bipolar type and multiple inpatient admissions in the past. He is endorsing auditory/visual hallucinations and homicidal thoughts. Patient reports that he has been seeing demons and angels. However, he has not been responding to internal stimuli since admission two days ago, no distress.  Calm, cooperative, and coherent.  He continues to take his medication and is no threat to himself or others, no prior charges of assaults.  [name] appears to be feigning symptoms to gain admissions when he needs a place to stay.  Past  Psychiatric History: as above  Risk to Self: Suicidal Ideation: No(Pt denies. ) Suicidal Intent: No Is patient at risk for suicide?: No Suicidal Plan?: No Access to Means: No What has been your use of drugs/alcohol within the last 12 months?: Cigarettes.  How many times?: (Pt reported, "I don't know." ) Other Self Harm Risks: Pt denies.  Triggers for Past Attempts: Unknown Intentional Self Injurious Behavior: None(Pt denies.) Risk to Others: None Prior Inpatient Therapy: Prior Inpatient Therapy: Yes Prior Therapy Dates: Pt reported, "it's been a while."  Prior Therapy Facilty/Provider(s): [facility], [facility], [facility], hospital in [place].  Reason for Treatment: Pt reported, anger issues and SI.  Prior Outpatient Therapy: Prior Outpatient Therapy: Yes Prior Therapy Dates: Current Prior Therapy Facilty/Provider(s): [facility] Reason for Treatment: Medication management. Does patient have an ACCT team?: No Does patient have Intensive In-House Services?  : No Does patient have [facility] services? : Yes Does patient have P4CC services?: No  Past Medical History:  Past Medical History:  Diagnosis Date  . Asthma   . Bipolar disorder ([place])   . 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 Depression   . Homelessness   . Schizophrenia ([place])   . Seizures ([place])    childhood seizures    Past Surgical History:  Procedure Laterality Date  . TONSILLECTOMY    . VENTRICULO-PERITONEAL SHUNT PLACEMENT / LAPAROSCOPIC INSERTION PERITONEAL CATHETER     Family History:  Family History  Problem Relation Age of Onset  . Hypertension Mother   . Mental illness Neg Hx    Family Psychiatric  History:  Social History:  Social History   Substance and Sexual Activity  Alcohol Use No  Social History   Substance and Sexual Activity  Drug Use No   Comment: History of use    Social History   Socioeconomic History  . Marital status: Single     Spouse name: None  . Number of children: None  . Years of education: None  . Highest education level: None  Social Needs  . Financial resource strain: None  . Food insecurity - worry: None  . Food insecurity - inability: None  . Transportation needs - medical: None  . Transportation needs - non-medical: None  Occupational History  . None  Tobacco Use  . Smoking status: Former Smoker    Packs/day: 0.50    Types: Cigarettes    Last attempt to quit: 10/22/2015    Years since quitting: 1.6  . Smokeless tobacco: Never Used  Substance and Sexual Activity  . Alcohol use: No  . Drug use: No    Comment: History of use  . Sexual activity: None  Other Topics Concern  . None  Social History Narrative  . None   Additional Social History:    Allergies:   Allergies  Allergen Reactions  . Amoxicillin Hives and Other (See Comments)    CHILDHOOD ALLERGY Has patient had a PCN reaction causing immediate rash, facial/tongue/throat swelling, SOB or lightheadedness with hypotension: YES Has patient had a PCN reaction causing severe rash involving mucus membranes or skin necrosis: No Has patient had a PCN reaction that required hospitalization No Has patient had a PCN reaction occurring within the last 10 years: No If all of the above answers are "NO", then may proceed with Cephalosporin use.  Marland Kitchen Penicillins Hives and Other (See Comments)    CHILDHOOD ALLERGY Has patient had a PCN reaction causing immediate rash, facial/tongue/throat swelling, SOB or lightheadedness with hypotension: No Has patient had a PCN reaction causing severe rash involving mucus membranes or skin necrosis: No Has patient had a PCN reaction that required hospitalization No Has patient had a PCN reaction occurring within the last 10 years: No If all of the above answers are "NO", then may proceed with Cephalosporin use.    Labs:  Results for orders placed or performed during the hospital encounter of 06/22/17 (from the  past 48 hour(s))  Comprehensive metabolic panel     Status: Abnormal   Collection Time: 06/22/17  8:05 PM  Result Value Ref Range   Sodium 135 135 - 145 mmol/L   Potassium 3.6 3.5 - 5.1 mmol/L   Chloride 103 101 - 111 mmol/L   CO2 25 22 - 32 mmol/L   Glucose, Bld 113 (H) 65 - 99 mg/dL   BUN 12 6 - 20 mg/dL   Creatinine, Ser 0.96 0.61 - 1.24 mg/dL   Calcium 8.9 8.9 - 10.3 mg/dL   Total Protein 7.3 6.5 - 8.1 g/dL   Albumin 4.2 3.5 - 5.0 g/dL   AST 33 15 - 41 U/L   ALT 49 17 - 63 U/L   Alkaline Phosphatase 102 38 - 126 U/L   Total Bilirubin 0.6 0.3 - 1.2 mg/dL   GFR calc non Af Amer >60 >60 mL/min   GFR calc Af Amer >60 >60 mL/min    Comment: (NOTE) The eGFR has been calculated using the CKD EPI equation. This calculation has not been validated in all clinical situations. eGFR's persistently <60 mL/min signify possible Chronic Kidney Disease.    Anion gap 7 5 - 15  Ethanol     Status: None   Collection Time:  06/22/17  8:05 PM  Result Value Ref Range   Alcohol, Ethyl (B) <10 <10 mg/dL    Comment:        LOWEST DETECTABLE LIMIT FOR SERUM ALCOHOL IS 10 mg/dL FOR MEDICAL PURPOSES ONLY   Salicylate level     Status: None   Collection Time: 06/22/17  8:05 PM  Result Value Ref Range   Salicylate Lvl <0.5 2.8 - 30.0 mg/dL  Acetaminophen level     Status: Abnormal   Collection Time: 06/22/17  8:05 PM  Result Value Ref Range   Acetaminophen (Tylenol), Serum <10 (L) 10 - 30 ug/mL    Comment:        THERAPEUTIC CONCENTRATIONS VARY SIGNIFICANTLY. A RANGE OF 10-30 ug/mL MAY BE AN EFFECTIVE CONCENTRATION FOR MANY PATIENTS. HOWEVER, SOME ARE BEST TREATED AT CONCENTRATIONS OUTSIDE THIS RANGE. ACETAMINOPHEN CONCENTRATIONS >150 ug/mL AT 4 HOURS AFTER INGESTION AND >50 ug/mL AT 12 HOURS AFTER INGESTION ARE OFTEN ASSOCIATED WITH TOXIC REACTIONS.   cbc     Status: None   Collection Time: 06/22/17  8:05 PM  Result Value Ref Range   WBC 10.3 4.0 - 10.5 K/uL   RBC 5.25 4.22 - 5.81  MIL/uL   Hemoglobin 15.0 13.0 - 17.0 g/dL   HCT 43.7 39.0 - 52.0 %   MCV 83.2 78.0 - 100.0 fL   MCH 28.6 26.0 - 34.0 pg   MCHC 34.3 30.0 - 36.0 g/dL   RDW 12.5 11.5 - 15.5 %   Platelets 259 150 - 400 K/uL  Rapid urine drug screen (hospital performed)     Status: None   Collection Time: 06/22/17  8:05 PM  Result Value Ref Range   Opiates NONE DETECTED NONE DETECTED   Cocaine NONE DETECTED NONE DETECTED   Benzodiazepines NONE DETECTED NONE DETECTED   Amphetamines NONE DETECTED NONE DETECTED   Tetrahydrocannabinol NONE DETECTED NONE DETECTED   Barbiturates NONE DETECTED NONE DETECTED    Comment:        DRUG SCREEN FOR MEDICAL PURPOSES ONLY.  IF CONFIRMATION IS NEEDED FOR ANY PURPOSE, NOTIFY LAB WITHIN 5 DAYS.        LOWEST DETECTABLE LIMITS FOR URINE DRUG SCREEN Drug Class       Cutoff (ng/mL) Amphetamine      1000 Barbiturate      200 Benzodiazepine   397 Tricyclics       673 Opiates          300 Cocaine          300 THC              50   Lithium level     Status: Abnormal   Collection Time: 06/22/17  8:09 PM  Result Value Ref Range   Lithium Lvl 0.22 (L) 0.60 - 1.20 mmol/L    Current Facility-Administered Medications  Medication Dose Route Frequency Provider Last Rate Last Dose  . acetaminophen (TYLENOL) tablet 650 mg  650 mg Oral Q4H PRN Sherwood Gambler, MD      . alum & mag hydroxide-simeth (MAALOX/MYLANTA) 200-200-20 MG/5ML suspension 30 mL  30 mL Oral Q6H PRN Sherwood Gambler, MD      . ARIPiprazole (ABILIFY) tablet 10 mg  10 mg Oral QHS Sherwood Gambler, MD   10 mg at 06/23/17 2111  . hydrOXYzine (ATARAX/VISTARIL) tablet 25 mg  25 mg Oral TID PRN Sherwood Gambler, MD      . lithium carbonate capsule 600 mg  600 mg Oral BID WC Sherwood Gambler, MD  600 mg at 06/24/17 0759  . risperiDONE (RISPERDAL M-TABS) disintegrating tablet 2 mg  2 mg Oral Q8H PRN Sherwood Gambler, MD       And  . LORazepam (ATIVAN) tablet 1 mg  1 mg Oral PRN Sherwood Gambler, MD      . nicotine  (NICODERM CQ - dosed in mg/24 hours) patch 21 mg  21 mg Transdermal Daily Sherwood Gambler, MD      . ondansetron [facility]) tablet 4 mg  4 mg Oral Q8H PRN Sherwood Gambler, MD      . traZODone (DESYREL) tablet 50 mg  50 mg Oral QHS PRN Sherwood Gambler, MD   50 mg at 06/23/17 2111   Current Outpatient Medications  Medication Sig Dispense Refill  . ARIPiprazole (ABILIFY) 10 MG tablet Take 1 tablet (10 mg total) by mouth at bedtime. For depression 30 tablet 0  . hydrOXYzine (ATARAX/VISTARIL) 25 MG tablet Take 1 tablet (25 mg total) by mouth 3 (three) times daily as needed for anxiety. 60 tablet 0  . lithium carbonate 600 MG capsule Take 1 capsule (600 mgs) in the morning and at supper: For mood stabilization 60 capsule 0  . nicotine (NICODERM CQ - DOSED IN MG/24 HOURS) 21 mg/24hr patch Place 1 patch (21 mg total) onto the skin daily. For smoking ceasation 28 patch 0  . traZODone (DESYREL) 50 MG tablet Take 1 tablet (50 mg total) by mouth at bedtime as needed for sleep. 30 tablet 0    Musculoskeletal: Strength & Muscle Tone: within normal limits Gait & Station: normal Patient leans: N/A  Psychiatric Specialty Exam: Physical Exam  Constitutional: He is oriented to person, place, and time. He appears well-developed and well-nourished.  HENT:  Head: Normocephalic.  Neck: Normal range of motion.  Respiratory: Effort normal.  Musculoskeletal: Normal range of motion.  Neurological: He is alert and oriented to person, place, and time.  Psychiatric: He has a normal mood and affect. His speech is normal and behavior is normal. Judgment and thought content normal. Cognition and memory are normal.    Review of Systems  Constitutional: Negative.   HENT: Negative.   Eyes: Negative.   Respiratory: Negative.   Cardiovascular: Negative.   Gastrointestinal: Negative.   Genitourinary: Negative.   Musculoskeletal: Negative.   Skin: Negative.   Neurological: Negative.   Endo/Heme/Allergies: Negative.    Psychiatric/Behavioral: Negative.   All other systems reviewed and are negative.   Blood pressure (!) 115/47, pulse 82, temperature 98.4 F (36.9 C), temperature source Oral, resp. rate 18, height _0  (1.6 m), weight 107 kg (236 lb), SpO2 95 %.Body mass index is 41.81 kg/m.  General Appearance: Casual  Eye Contact:  Good  Speech:  Clear and Coherent  Volume:  Normal  Mood:  Irritable  Affect:  Congruent  Thought Process:  Coherent  Orientation:  Full (Time, Place, and Person)  Thought Content:  WDL  Suicidal Thoughts:  No  Homicidal Thoughts:  No  Memory:  Immediate, good; recent, good; remote, good  Judgement:  Fair  Insight:  Fair  Psychomotor Activity:  WDL  Concentration:  Concentration and attention span:  Good  Recall:  Good  Fund of Knowledge:  Fair  Language: Good  Akathisia:  No  Handed:  Right  AIMS (if indicated):     Assets:  Communication Skills  ADL's:  Intact  Cognition:  WNL  Sleep:   good     Treatment Plan Summary: Daily contact with patient to assess and evaluate symptoms  and progress in treatment and Medication management  Schizoaffective Bipolar: -Crisis stabilization -Medication management:  Continue Abilify 10 mg daily for mood stabilization, Lithium 600 mg BID for mood stabilization, Vistaril 25 mg TID PRN anxiety, and Trazodone 50 mg at bedtime PRN sleep -Individual counseling  Disposition: Discharge home  Waylan Boga, NP 06/24/2017 9:55 AM  Patient seen face-to-face for psychiatric evaluation, chart reviewed and case discussed with the physician extender and developed treatment plan. Reviewed the information documented and agree with the treatment plan. Corena Pilgrim, MD

## 2017-06-25 ENCOUNTER — Encounter (HOSPITAL_COMMUNITY): Payer: Self-pay

## 2017-06-25 ENCOUNTER — Other Ambulatory Visit: Payer: Self-pay

## 2017-06-25 ENCOUNTER — Emergency Department (HOSPITAL_COMMUNITY)
Admission: EM | Admit: 2017-06-25 | Discharge: 2017-06-27 | Disposition: A | Discharge status: Home / Self Care | Payer: Medicaid Other | Attending: Emergency Medicine | Admitting: Emergency Medicine

## 2017-06-25 DIAGNOSIS — Z87891 Personal history of nicotine dependence: Secondary | ICD-10-CM | POA: Diagnosis not present

## 2017-06-25 DIAGNOSIS — F250 Schizoaffective disorder, bipolar type: Secondary | ICD-10-CM | POA: Diagnosis not present

## 2017-06-25 DIAGNOSIS — R45851 Suicidal ideations: Secondary | ICD-10-CM | POA: Diagnosis not present

## 2017-06-25 DIAGNOSIS — T50902A Poisoning by unspecified drugs, medicaments and biological substances, intentional self-harm, initial encounter: Secondary | ICD-10-CM

## 2017-06-25 DIAGNOSIS — J45909 Unspecified asthma, uncomplicated: Secondary | ICD-10-CM | POA: Insufficient documentation

## 2017-06-25 DIAGNOSIS — T424X2A Poisoning by benzodiazepines, intentional self-harm, initial encounter: Secondary | ICD-10-CM | POA: Diagnosis not present

## 2017-06-25 DIAGNOSIS — T43592A Poisoning by other antipsychotics and neuroleptics, intentional self-harm, initial encounter: Secondary | ICD-10-CM | POA: Diagnosis not present

## 2017-06-25 DIAGNOSIS — T391X2A Poisoning by 4-Aminophenol derivatives, intentional self-harm, initial encounter: Secondary | ICD-10-CM | POA: Diagnosis not present

## 2017-06-25 LAB — ACETAMINOPHEN LEVEL: Acetaminophen (Tylenol), Serum: <10 ug/mL — ABNORMAL LOW (ref 10–30)

## 2017-06-25 LAB — RAPID URINE DRUG SCREEN, HOSP PERFORMED
Amphetamines: NOT DETECTED
Barbiturates: NOT DETECTED
Benzodiazepines: NOT DETECTED
Cocaine: NOT DETECTED
Opiates: NOT DETECTED
Tetrahydrocannabinol: NOT DETECTED

## 2017-06-25 LAB — CBC
HCT: 43.4 % (ref 39.0–52.0)
Hemoglobin: 14.6 g/dL (ref 13.0–17.0)
MCH: 28.2 pg (ref 26.0–34.0)
MCHC: 33.6 g/dL (ref 30.0–36.0)
MCV: 83.8 fL (ref 78.0–100.0)
Platelets: 245 10*3/uL (ref 150–400)
RBC: 5.18 MIL/uL (ref 4.22–5.81)
RDW: 12.5 % (ref 11.5–15.5)
WBC: 11.2 10*3/uL — ABNORMAL HIGH (ref 4.0–10.5)

## 2017-06-25 LAB — COMPREHENSIVE METABOLIC PANEL
ALT: 56 U/L (ref 17–63)
AST: 38 U/L (ref 15–41)
Albumin: 4.1 g/dL (ref 3.5–5.0)
Alkaline Phosphatase: 96 U/L (ref 38–126)
Anion gap: 8 (ref 5–15)
BUN: 13 mg/dL (ref 6–20)
CO2: 26 mmol/L (ref 22–32)
Calcium: 8.9 mg/dL (ref 8.9–10.3)
Chloride: 106 mmol/L (ref 101–111)
Creatinine, Ser: 0.91 mg/dL (ref 0.61–1.24)
GFR calc Af Amer: >60 mL/min (ref >60)
GFR calc non Af Amer: >60 mL/min (ref >60)
Glucose, Bld: 102 mg/dL — ABNORMAL HIGH (ref 65–99)
Potassium: 3.5 mmol/L (ref 3.5–5.1)
Sodium: 140 mmol/L (ref 135–145)
Total Bilirubin: 0.7 mg/dL (ref 0.3–1.2)
Total Protein: 7.1 g/dL (ref 6.5–8.1)

## 2017-06-25 LAB — CBG MONITORING, ED: Glucose-Capillary: 121 mg/dL — ABNORMAL HIGH (ref 65–99)

## 2017-06-25 LAB — LITHIUM LEVEL: Lithium Lvl: 0.07 mmol/L — ABNORMAL LOW (ref 0.60–1.20)

## 2017-06-25 LAB — SALICYLATE LEVEL: Salicylate Lvl: <7 mg/dL (ref 2.8–30.0)

## 2017-06-25 LAB — ETHANOL: Alcohol, Ethyl (B): <10 mg/dL (ref <10)

## 2017-06-25 MED ORDER — IBUPROFEN 200 MG PO TABS: 600.0000 mg | ORAL_TABLET | Freq: Three times a day (TID) | ORAL | Status: DC | PRN

## 2017-06-25 MED ORDER — SODIUM CHLORIDE 0.9 % IV BOLUS (SEPSIS)
1000.0000 mL | Freq: Once | INTRAVENOUS | Status: AC
  Administered 2017-06-25: 1000 mL via INTRAVENOUS

## 2017-06-25 MED ORDER — TRAZODONE HCL 50 MG PO TABS: 50.0000 mg | ORAL_TABLET | Freq: Every evening | ORAL | Status: DC | PRN

## 2017-06-25 MED ORDER — ARIPIPRAZOLE 10 MG PO TABS
10.0000 mg | ORAL_TABLET | Freq: Every day | ORAL | Status: DC
  Filled 2017-06-25: qty 1

## 2017-06-25 MED ORDER — HYDROXYZINE HCL 25 MG PO TABS: 25.0000 mg | ORAL_TABLET | Freq: Three times a day (TID) | ORAL | Status: DC | PRN

## 2017-06-25 MED ORDER — NICOTINE 21 MG/24HR TD PT24: 21.0000 mg | MEDICATED_PATCH | Freq: Every day | TRANSDERMAL | Status: DC

## 2017-06-25 MED ORDER — LITHIUM CARBONATE 300 MG PO CAPS
600.0000 mg | ORAL_CAPSULE | Freq: Every morning | ORAL | Status: DC
  Administered 2017-06-26 – 2017-06-27 (×2): 600 mg via ORAL
  Filled 2017-06-25 (×2): qty 2

## 2017-06-25 MED ORDER — ALUM & MAG HYDROXIDE-SIMETH 200-200-20 MG/5ML PO SUSP: 30.0000 mL | Freq: Four times a day (QID) | ORAL | Status: DC | PRN

## 2017-06-25 MED ORDER — ONDANSETRON HCL 4 MG PO TABS: 4.0000 mg | ORAL_TABLET | Freq: Three times a day (TID) | ORAL | Status: DC | PRN

## 2017-06-25 NOTE — ED Notes (Signed)
SBAR Report received from previous nurse. Pt received calm and visible on unit. Pt denies current HI, A/V H at this time, and appears otherwise stable and free of distress and went directly to bed. Pt endorses suicide attempt last night and that he threw up all the pills he took. Pt reminded of camera surveillance, q 15 min rounds, and rules of the milieu. Will continue to assess.

## 2017-06-25 NOTE — ED Notes (Signed)
Bed: [facility]A Expected date:  Expected time:  Means of arrival:  Comments: EMS overdose-coming from [facility]/vomited after taking meds-HR90's

## 2017-06-25 NOTE — ED Notes (Signed)
Bed: [facility] Expected date:  Expected time:  Means of arrival:  Comments: [facility]

## 2017-06-25 NOTE — ED Triage Notes (Signed)
Patient arrives by [facility] from [place] with reported overdose of his own medication. Patient reports he took Abilify, Percocet, xanax, Trazadone, Visteril-no pills are missing from the bottles. Patient reports he was kicked out of where he was staying and is now homless.

## 2017-06-25 NOTE — ED Notes (Signed)
Pt belongings placed in cabinet at nursing station at 16-18 area

## 2017-06-25 NOTE — ED Provider Notes (Signed)
Emergency Department Provider Note   I have reviewed the triage vital signs and the nursing notes.   HISTORY  Chief Complaint Suicidal and Ingestion   HPI Justin Chaney is a 21 y.o. male with PMH of asthma, Bipolar disorder, and Schizophrenia to the emergency department for evaluation after he reportedly took multiple medications in a suicide attempt.  The patient states that he Percocet, Xanax, Trazadone, and Visteril 1 hour PTA.  Patient states he had overwhelming depression and so decided to take the overdose of pills.  He states that 10 minutes after taking the meds he immediately vomited.  He denies any shortness of breath or chest pain.  He went to [facility] and was referred here for further evaluation. Denies hallucinations. No HI. Denies EtOH or drug use.    Past Medical History:  Diagnosis Date  . Asthma   . Bipolar disorder ([place])   . 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 Depression   . Homelessness   . Schizophrenia ([place])   . Seizures ([place])    childhood seizures    Patient Active Problem List   Diagnosis Date Noted  . Cocaine use disorder, severe, in early remission ([place]) 12/21/2016  . Amphetamine abuse in remission ([place]) 12/21/2016  . ADHD (attention deficit hyperactivity disorder) 12/21/2016  . 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 Tobacco use disorder 12/21/2016  . Tobacco use 10/06/2016  . Homicidal ideations 09/16/2016  . Suicidal ideation   . Hallucinations   . Homicidal ideation   . Suicidal thoughts   . Cannabis use disorder, moderate, in sustained remission ([place]) 11/07/2015  . Depression 10/29/2015  . Intermittent explosive disorder 09/05/2015  . Schizoaffective disorder, bipolar type ([place]) 09/04/2015  . Malingering 06/05/2015  . Antisocial personality disorder ([place]) 06/03/2015  . Cocaine abuse ([place]) 06/03/2015    Past Surgical History:  Procedure Laterality Date  . TONSILLECTOMY    . VENTRICULO-PERITONEAL SHUNT PLACEMENT /  LAPAROSCOPIC INSERTION PERITONEAL CATHETER      Current Outpatient Rx  . Order #: [id number] Class: Normal  . Order #: [id number] Class: Normal  . Order #: [id number] Class: Normal  . Order #: [id number] Class: Normal  . Order #: [id number] Class: Normal    Allergies Amoxicillin and Penicillins  Family History  Problem Relation Age of Onset  . Hypertension Mother   . Mental illness Neg Hx     Social History Social History   Tobacco Use  . Smoking status: Former Smoker    Packs/day: 0.50    Types: Cigarettes    Last attempt to quit: 10/22/2015    Years since quitting: 1.6  . Smokeless tobacco: Never Used  Substance Use Topics  . Alcohol use: No  . Drug use: No    Comment: History of use    Review of Systems  Constitutional: No fever/chills Eyes: No visual changes. ENT: No sore throat. Cardiovascular: Denies chest pain. Respiratory: Denies shortness of breath. Gastrointestinal: No abdominal pain. Positive nausea, no vomiting.  No diarrhea.  No constipation. Genitourinary: Negative for dysuria. Musculoskeletal: Negative for back pain. Skin: Negative for rash. Neurological: Negative for headaches, focal weakness or numbness.  10-point ROS otherwise negative.  ____________________________________________   PHYSICAL EXAM:  VITAL SIGNS: ED Triage Vitals  Enc Vitals Group     BP 06/25/17 2104 (!) 124/96     Pulse Rate 06/25/17 2104 96     Resp 06/25/17 2105 (!) 23     Temp --      Temp src --  SpO2 06/25/17 2043 98 %     Weight 06/25/17 2247 236 lb (107 kg)     Height 06/25/17 2247 5\' 3"  (1.6 m)    Constitutional: Alert and oriented. Well appearing and in no acute distress. Eyes: Conjunctivae are normal. Head: Atraumatic. Nose: No congestion/rhinnorhea. Mouth/Throat: Mucous membranes are moist.   Neck: No stridor.   Cardiovascular: Normal rate, regular rhythm. Good peripheral circulation. Grossly normal heart sounds.   Respiratory: Normal respiratory  effort.  No retractions. Lungs CTAB. Gastrointestinal: Soft and nontender. No distention.  Musculoskeletal: No lower extremity tenderness nor edema. No gross deformities of extremities. Neurologic:  Normal speech and language. No gross focal neurologic deficits are appreciated.  Skin:  Skin is warm, dry and intact. No rash noted.  ____________________________________________   LABS (all labs ordered are listed, but only abnormal results are displayed)  Labs Reviewed  COMPREHENSIVE METABOLIC PANEL - Abnormal; Notable for the following components:      Result Value   Glucose, Bld 102 (*)    All other components within normal limits  ACETAMINOPHEN LEVEL - Abnormal; Notable for the following components:   Acetaminophen (Tylenol), Serum <10 (*)    All other components within normal limits  CBC - Abnormal; Notable for the following components:   WBC 11.2 (*)    All other components within normal limits  LITHIUM LEVEL - Abnormal; Notable for the following components:   Lithium Lvl 0.07 (*)    All other components within normal limits  CBG MONITORING, ED - Abnormal; Notable for the following components:   Glucose-Capillary 121 (*)    All other components within normal limits  ETHANOL  SALICYLATE LEVEL  RAPID URINE DRUG SCREEN, HOSP PERFORMED   ____________________________________________  EKG   EKG Interpretation  Date/Time:  Saturday June 25 2017 21:07:47 EST Ventricular Rate:  92 PR Interval:    QRS Duration: 84 QT Interval:  355 QTC Calculation: 440 R Axis:   60 Text Interpretation:  Sinus rhythm No STEMI.  Confirmed by Nanda Quinton [phone]) on 06/25/2017 9:30:00 PM      ____________________________________________   PROCEDURES  Procedure(s) performed:   Procedures  None ____________________________________________   INITIAL IMPRESSION / ASSESSMENT AND PLAN / ED COURSE  Pertinent labs & imaging results that were available during my care of the patient  were reviewed by me and considered in my medical decision making (see chart for details).  Patient presents emergency department for evaluation after suicide attempt.  He had vomiting shortly after taking the overdose.  Vital signs are normal.  EKG unremarkable.  Labs unremarkable.  After ED observation the patient is medically cleared for psychiatry evaluation.  He is cooperative and is actively seeking care.  I have not placed patient under IVC at this time.   ____________________________________________  FINAL CLINICAL IMPRESSION(S) / ED DIAGNOSES  Final diagnoses:  Intentional drug overdose, initial encounter ([place])  Suicidal ideations     MEDICATIONS GIVEN DURING THIS VISIT:  Medications  ibuprofen (ADVIL,MOTRIN) tablet 600 mg (not administered)  ondansetron (ZOFRAN) tablet 4 mg (not administered)  alum & mag hydroxide-simeth (MAALOX/MYLANTA) 200-200-20 MG/5ML suspension 30 mL (not administered)  nicotine (NICODERM CQ - dosed in mg/24 hours) patch 21 mg (not administered)  ARIPiprazole (ABILIFY) tablet 10 mg (not administered)  hydrOXYzine (ATARAX/VISTARIL) tablet 25 mg (not administered)  lithium carbonate capsule 600 mg (not administered)  traZODone (DESYREL) tablet 50 mg (not administered)  sodium chloride 0.9 % bolus 1,000 mL (0 mLs Intravenous Stopped 06/25/17 2206)  Note:  This document was prepared using Dragon voice recognition software and may include unintentional dictation errors.  Nanda Quinton, MD Emergency Medicine    Kolten Ryback, W[facility], MD 06/25/17 [phone]

## 2017-06-26 ENCOUNTER — Encounter (HOSPITAL_COMMUNITY): Payer: Self-pay | Admitting: Behavioral Health

## 2017-06-26 NOTE — ED Notes (Signed)
Pt cleared by poison control.  

## 2017-06-26 NOTE — ED Notes (Signed)
Pt in day room interacting with peers.

## 2017-06-26 NOTE — ED Notes (Signed)
Pt compliant with medication regimen. Pt reports that he "feels better." Pt also reports he slept good last night. Pt observed resting throughout the shift. Pt denies SI/HI.AVH. Encouragement and support provided. Special checks q 15 mins in place for safety, Video monitoring in place. Will continue to monitor.

## 2017-06-26 NOTE — BH Assessment (Signed)
[place] Assessment Progress Note    Per [name], FNP, patient will need to be observed and monitored for safety and be re-assessed in the morning by the psychiatrist. Informed Dr. Venora Maples of this disposition.

## 2017-06-26 NOTE — ED Notes (Signed)
SBAR Report received from previous nurse. Pt received calm and visible on unit. Pt denies current HI, A/V H,  anxiety, or pain at this time, and appears otherwise stable and free of distress. Pt reminded of camera surveillance, q 15 min rounds, and rules of the milieu. Will continue to assess.

## 2017-06-26 NOTE — ED Notes (Signed)
Pt taking a shower 

## 2017-06-26 NOTE — BH Assessment (Addendum)
Assessment Note  Justin Chaney is an 21 y.o. male with a history of Bipolar disorder, and Schizophrenia to the emergency department for evaluation after he reportedly took multiple medications in a suicide attempt.  The patient states that he Percocet, Xanax, Trazadone, and Vistaril 1 hour PTA. He states that he threw up the overdose of medications prior to arriving at the ED. Patient states he had overwhelming depression and so decided to take the overdose of pills. Patient states that "I am going through a lot."  When asked what he was going through, he stated: "I don't want to talk about it." Patient states that he was staying with a friend, but something has happened and he states that he can no longer stay there and that he is essentially homeless.  He states that he has no contact or support from his family and he is currently not in a relationship.  Patient is on disability for schizo-affective disorder and states that he he is his own guardian.  Patient states that he goes to [facility] for services and states that he is compliant with his medications, but could not tell this Probation officer the name of his psychiatrist at [facility] and states: "I only go there when I run out of my medications to get them refilled."  Patient states that he has been suicidal in the past and was recently in the ED here last week with homicidal ideations and psychosis and states that he was going to [facility] but would not have a ride back at the completion of his treatment there and he was discharged from the ED.  Patient states that he has made attempts in the past to hurt himself, but was evasive in his reporting of these attempts to this Probation officer.  He states: "Why are you asking me all of these questions?"    Patient states that he took the overdose tonight because he was "so depressed that I could not take it anymore, I wanted to end it all."  However, again patient would not share any specifics about why he wanted to end it  all. He states that he does not care about life anymore and just wants to die.  He states that he just feels hopeless, helpless and states that he is lacking motivation to thrive.  Patient denies any current psychosis and denies any HI. He states that he is eating and sleeping well. Patient denies any drug or alcohol use and states that he has not used anything in a long time and that he was never really a heavy user.  He states that he smoked marijuana on one to two occasions in the past and states that he never really drank alcohol.  Patient may be seeking admission to the hospital for secondary gain.  His UDS is negative.  Also, he states that he has been compliant with his medications, but his lithium level is not at a therapeutic level.  TTS recommends that patient be monitored for safety tonight and patient will be seen in the AM for reassessment or Psch Eval.     Diagnosis: Schizo-affective Disorder Depressed Mood F25.1  Past Medical History:  Past Medical History:  Diagnosis Date  . Asthma   . Bipolar disorder ([place])   . 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 Depression   . Homelessness   . Schizophrenia ([place])   . Seizures ([place])    childhood seizures    Past Surgical History:  Procedure Laterality Date  .  TONSILLECTOMY    . VENTRICULO-PERITONEAL SHUNT PLACEMENT / LAPAROSCOPIC INSERTION PERITONEAL CATHETER      Family History:  Family History  Problem Relation Age of Onset  . Hypertension Mother   . Mental illness Neg Hx     Social History:  reports that he quit smoking about 20 months ago. His smoking use included cigarettes. He smoked 0.50 packs per day. he has never used smokeless tobacco. He reports that he does not drink alcohol or use drugs.  Additional Social History:  Alcohol / Drug Use Pain Medications: denies Prescriptions: denies Over the Counter: denies History of alcohol / drug use?: No history of alcohol / drug abuse  CIWA:  CIWA-Ar BP: 108/72 Pulse Rate: 79 COWS:    Allergies:  Allergies  Allergen Reactions  . Amoxicillin Hives and Other (See Comments)    CHILDHOOD ALLERGY Has patient had a PCN reaction causing immediate rash, facial/tongue/throat swelling, SOB or lightheadedness with hypotension: YES Has patient had a PCN reaction causing severe rash involving mucus membranes or skin necrosis: No Has patient had a PCN reaction that required hospitalization No Has patient had a PCN reaction occurring within the last 10 years: No If all of the above answers are "NO", then may proceed with Cephalosporin use.  Marland Kitchen Penicillins Hives and Other (See Comments)    CHILDHOOD ALLERGY Has patient had a PCN reaction causing immediate rash, facial/tongue/throat swelling, SOB or lightheadedness with hypotension: No Has patient had a PCN reaction causing severe rash involving mucus membranes or skin necrosis: No Has patient had a PCN reaction that required hospitalization No Has patient had a PCN reaction occurring within the last 10 years: No If all of the above answers are "NO", then may proceed with Cephalosporin use.    Home Medications:  (Not in a hospital admission)  OB/GYN Status:  No LMP for male patient.  General Assessment Data Location of Assessment: [facility] ED TTS Assessment: In system Is this a Tele or Face-to-Face Assessment?: Face-to-Face Is this an Initial Assessment or a Re-assessment for this encounter?: Initial Assessment Marital status: Single Living Arrangements: Alone(lost his place to stay and is essentially homeless) Can pt return to current living arrangement?: No Admission Status: Voluntary Is patient capable of signing voluntary admission?: Yes Referral Source: MD Insurance type: (Medicaid)     Crisis Care Plan Living Arrangements: Alone(lost his place to stay and is essentially homeless) Legal Guardian: Other:(self) Name of Psychiatrist: (Goes to [facility], does not know his  name) Name of Therapist: (none reported)  Education Status Is patient currently in school?: No Current Grade: (NA) Highest grade of school patient has completed: 9 Name of school: ([employer]) Contact person: NA  Risk to self with the past 6 months Suicidal Ideation: Yes-Currently Present Has patient been a risk to self within the past 6 months prior to admission? : Yes Suicidal Intent: Yes-Currently Present Has patient had any suicidal intent within the past 6 months prior to admission? : Yes Is patient at risk for suicide?: Yes(homeless and no support) Suicidal Plan?: Yes-Currently Present(states that he took multiple mental health medications) Has patient had any suicidal plan within the past 6 months prior to admission? : Yes Specify Current Suicidal Plan: (overdose) Access to Means: Yes Specify Access to Suicidal Means: (mental health medications) What has been your use of drugs/alcohol within the last 12 months?: (denies) Previous Attempts/Gestures: Yes How many times?: (would not answer) Other Self Harm Risks: (none reported) Triggers for Past Attempts: Unknown Intentional Self Injurious  Behavior: None Family Suicide History: Unknown Recent stressful life event(s): Other (Comment) Persecutory voices/beliefs?: Yes Depression: Yes Depression Symptoms: Despondent, Loss of interest in usual pleasures, Feeling worthless/self pity Substance abuse history and/or treatment for substance abuse?: No Suicide prevention information given to non-admitted patients: Not applicable  Risk to Others within the past 6 months Homicidal Ideation: No Does patient have any lifetime risk of violence toward others beyond the six months prior to admission? : No Thoughts of Harm to Others: No Comment - Thoughts of Harm to Others: (none) Current Homicidal Intent: No Current Homicidal Plan: No Access to Homicidal Means: No Identified Victim: (none) History of harm to others?:  No Assessment of Violence: On admission Violent Behavior Description: (none) Does patient have access to weapons?: No Criminal Charges Pending?: No Does patient have a court date: No Is patient on probation?: No  Psychosis Hallucinations: None noted Delusions: None noted  Mental Status Report Appearance/Hygiene: Disheveled Eye Contact: Fair Motor Activity: Unremarkable Speech: Unremarkable Level of Consciousness: Alert Mood: Depressed Affect: Depressed Anxiety Level: Minimal Thought Processes: Coherent Judgement: Impaired Orientation: Person, Place, Time, Situation Obsessive Compulsive Thoughts/Behaviors: None  Cognitive Functioning Concentration: Decreased Memory: Recent Intact, Remote Intact IQ: Average Insight: Fair Impulse Control: Fair Appetite: Good Weight Gain: (5-10 lbs) Sleep: No Change Total Hours of Sleep: 8 Vegetative Symptoms: Unable to Assess  ADLScreening [facility] Assessment Services) Patient's cognitive ability adequate to safely complete daily activities?: Yes Patient able to express need for assistance with ADLs?: Yes Independently performs ADLs?: Yes (appropriate for developmental age)  Prior Inpatient Therapy Prior Inpatient Therapy: Yes Prior Therapy Dates: (would not disclose, states that it has been a good while ) Prior Therapy Facilty/Provider(s): ([facility] in the past) Reason for Treatment: (schizo-affective Disorder/Depressed)  Prior Outpatient Therapy Prior Outpatient Therapy: Yes Prior Therapy Dates: (current) Prior Therapy Facilty/Provider(s): [facility]) Reason for Treatment: (schizoaffective disorder) Does patient have an ACCT team?: No Does patient have Intensive In-House Services?  : No Does patient have [facility] services? : Yes Does patient have P4CC services?: No  ADL Screening (condition at time of admission) Patient's cognitive ability adequate to safely complete daily activities?: Yes Is the patient deaf or have  difficulty hearing?: No Does the patient have difficulty seeing, even when wearing glasses/contacts?: No Does the patient have difficulty concentrating, remembering, or making decisions?: No Patient able to express need for assistance with ADLs?: Yes Does the patient have difficulty dressing or bathing?: No Independently performs ADLs?: Yes (appropriate for developmental age) Does the patient have difficulty walking or climbing stairs?: No Weakness of Legs: None Weakness of Arms/Hands: None       Abuse/Neglect Assessment (Assessment to be complete while patient is alone) Physical Abuse: Denies Verbal Abuse: Denies Sexual Abuse: Denies Exploitation of patient/patient's resources: Denies Self-Neglect: Denies Values / Beliefs Cultural Requests During Hospitalization: None Spiritual Requests During Hospitalization: None Consults Spiritual Care Consult Needed: No Social Work Consult Needed: No Regulatory affairs officer (For Healthcare) Does Patient Have a Medical Advance Directive?: No Would patient like information on creating a medical advance directive?: No - Patient declined    Additional Information 1:1 In Past 12 Months?: No CIRT Risk: No Elopement Risk: No Does patient have medical clearance?: No     Disposition: Per [name], FNP, patient will need to be observed and monitored for safety and be re-assessed in the morning by the psychiatrist. Disposition Initial Assessment Completed for this Encounter: Yes Disposition of Patient: Pending Review with psychiatrist Type of inpatient treatment program: Adult  On Site Evaluation by:   Reviewed with Physician:    Judeth Porch Kim Oki 06/26/2017 1:28 AM

## 2017-06-27 DIAGNOSIS — T1491XA Suicide attempt, initial encounter: Secondary | ICD-10-CM

## 2017-06-27 DIAGNOSIS — T391X2A Poisoning by 4-Aminophenol derivatives, intentional self-harm, initial encounter: Secondary | ICD-10-CM

## 2017-06-27 DIAGNOSIS — Z560 Unemployment, unspecified: Secondary | ICD-10-CM

## 2017-06-27 DIAGNOSIS — T424X2A Poisoning by benzodiazepines, intentional self-harm, initial encounter: Secondary | ICD-10-CM

## 2017-06-27 DIAGNOSIS — F250 Schizoaffective disorder, bipolar type: Secondary | ICD-10-CM

## 2017-06-27 DIAGNOSIS — T43592A Poisoning by other antipsychotics and neuroleptics, intentional self-harm, initial encounter: Secondary | ICD-10-CM

## 2017-06-27 DIAGNOSIS — R4587 Impulsiveness: Secondary | ICD-10-CM

## 2017-06-27 NOTE — Discharge Instructions (Signed)
For your mental health needs, you are advised to continue treatment with [facility]: ° °     [facility] °     [address] °     [place], [place] [address] °     [phone] °

## 2017-06-27 NOTE — BH Assessment (Signed)
[facility] Assessment Progress Note  Per Corena Pilgrim, MD, this pt does not require psychiatric hospitalization at this time.  Pt is to be discharged from [facility] with recommendation to continue treatment with [facility].  This has been included in pt's discharge instructions.  Pt's nurse, Caryl Pina, has been notified.  Jalene Mullet, [place] Triage Specialist [phone]

## 2017-06-27 NOTE — BHH Suicide Risk Assessment (Signed)
Suicide Risk Assessment  Discharge Assessment   [facility] Discharge Suicide Risk Assessment   Principal Problem: Schizoaffective disorder, bipolar type [facility]) Discharge Diagnoses:  Patient Active Problem List   Diagnosis Date Noted  . Homicidal ideation [R45.850]     Priority: High  . Schizoaffective disorder, bipolar type ([place]) [F25.0] 09/04/2015    Priority: High  . Cocaine use disorder, severe, in early remission ([place]) [F14.21] 12/21/2016  . 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 ADHD (attention deficit hyperactivity disorder) [F90.9] 12/21/2016  . 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 Tobacco use disorder [F17.200] 12/21/2016  . Tobacco use [Z72.0] 10/06/2016  . Homicidal ideations [R45.850] 09/16/2016  . Suicidal ideation [R45.851]   . Hallucinations [R44.3]   . Suicidal thoughts [R45.851]   . Cannabis use disorder, moderate, in sustained remission ([place]) [F12.21] 11/07/2015  . Depression [F32.9] 10/29/2015  . Intermittent explosive disorder [F63.81] 09/05/2015  . Malingering [Z76.5] 06/05/2015  . Antisocial personality disorder ([place]) [F60.2] 06/03/2015  . Cocaine abuse ([place]) [F14.10] 06/03/2015   Pt was seen and chart reviewed with treatment team and Dr Darleene Cleaver. Pt stated he took an overdose of Percocet, Xanax, Abilify, and Vistaril 1 hour prior to arrival but then threw them up. BAL and UDS were negative.  Pt stated he knows he should not come to the ED as often as he does and he is going to go back to [place] and do whatever he needs to to get his life together.  Pt denies suicidal/homicidal ideation, denies auditory/visual hallucinations and does not appear to be responding to internal stimuli. Pt is stable and psychiatrically clear for discharge.  Total Time spent with patient: 45 minutes  Musculoskeletal: Strength & Muscle Tone: within normal limits Gait & Station: normal Patient leans: N/A  Psychiatric Specialty Exam:   Blood pressure 115/61, pulse  88, temperature 98.7 F (37.1 C), temperature source Oral, resp. rate 17, height 5\' 3"  (1.6 m), weight 107 kg (236 lb), SpO2 97 %.Body mass index is 41.81 kg/m.  General Appearance: Casual  Eye Contact::  Good  Speech:  Clear and Coherent409  Volume:  Normal  Mood:  Euthymic  Affect:  Congruent  Thought Process:  Coherent, Goal Directed and Linear  Orientation:  Full (Time, Place, and Person)  Thought Content:  Logical  Suicidal Thoughts:  No  Homicidal Thoughts:  No  Memory:  Immediate;   Good Recent;   Good Remote;   Fair  Judgement:  Fair  Insight:  Fair  Psychomotor Activity:  Normal  Concentration:  Good  Recall:  Good  Fund of Knowledge:Good  Language: Good  Akathisia:  No  Handed:  Right  AIMS (if indicated):     Assets:  Communication Skills Desire for Improvement Financial Resources/Insurance Housing Physical Health  Sleep:     Cognition: WNL  ADL's:  Intact   Mental Status Per Nursing Assessment::   On Admission:   depressed  Demographic Factors:  Male, Adolescent or young adult, Low socioeconomic status and Unemployed  Loss Factors: Financial problems/change in socioeconomic status  Historical Factors: Impulsivity  Risk Reduction Factors:   Sense of responsibility to family  Continued Clinical Symptoms:  Depression:   Impulsivity  Cognitive Features That Contribute To Risk:  Closed-mindedness    Suicide Risk:  Minimal: No identifiable suicidal ideation.  Patients presenting with no risk factors but with morbid ruminations; may be classified as minimal risk based on the severity of the depressive symptoms    Plan Of Care/Follow-up recommendations:  Activity:  as tolerated Diet:  Heart Healthy  Ethelene Hal, NP 06/27/2017, 12:01 PM

## 2017-06-27 NOTE — ED Notes (Addendum)
Pt d/c home per MD order. Discharge summary reviewed with pt. Pt verbalizes understanding. Pt denies SI/HI/AVH. Pt signed for personal property and property returned. Bus pass provided per pt request. Pt signed e-signature. Ambulatory off unit.

## 2017-08-12 ENCOUNTER — Encounter (HOSPITAL_COMMUNITY): Payer: Self-pay | Admitting: Emergency Medicine

## 2017-08-12 ENCOUNTER — Emergency Department (HOSPITAL_COMMUNITY)
Admission: EM | Admit: 2017-08-12 | Discharge: 2017-08-12 | Disposition: A | Discharge status: Home / Self Care | Payer: Medicaid Other | Attending: Emergency Medicine | Admitting: Emergency Medicine

## 2017-08-12 DIAGNOSIS — Z5321 Procedure and treatment not carried out due to patient leaving prior to being seen by health care provider: Secondary | ICD-10-CM | POA: Diagnosis not present

## 2017-08-12 DIAGNOSIS — M79604 Pain in right leg: Secondary | ICD-10-CM | POA: Insufficient documentation
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
## 2017-08-12 NOTE — ED Notes (Signed)
PA attempted to engage in conversation with pt and pt would not speak to him and then left without allowing the PA to examine him. Pt walked out of ED stable and ambulatory
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
## 2017-11-06 IMAGING — CR DG CHEST 2V
2 series · 2 of 2 positions shown · non-contrast
Comparison: 12/08/2014

CLINICAL DATA: Cough with fever for 3-4 days. Left-sided chest pain
when coughing

EXAM:
CHEST  2 VIEW

[w chest pa]
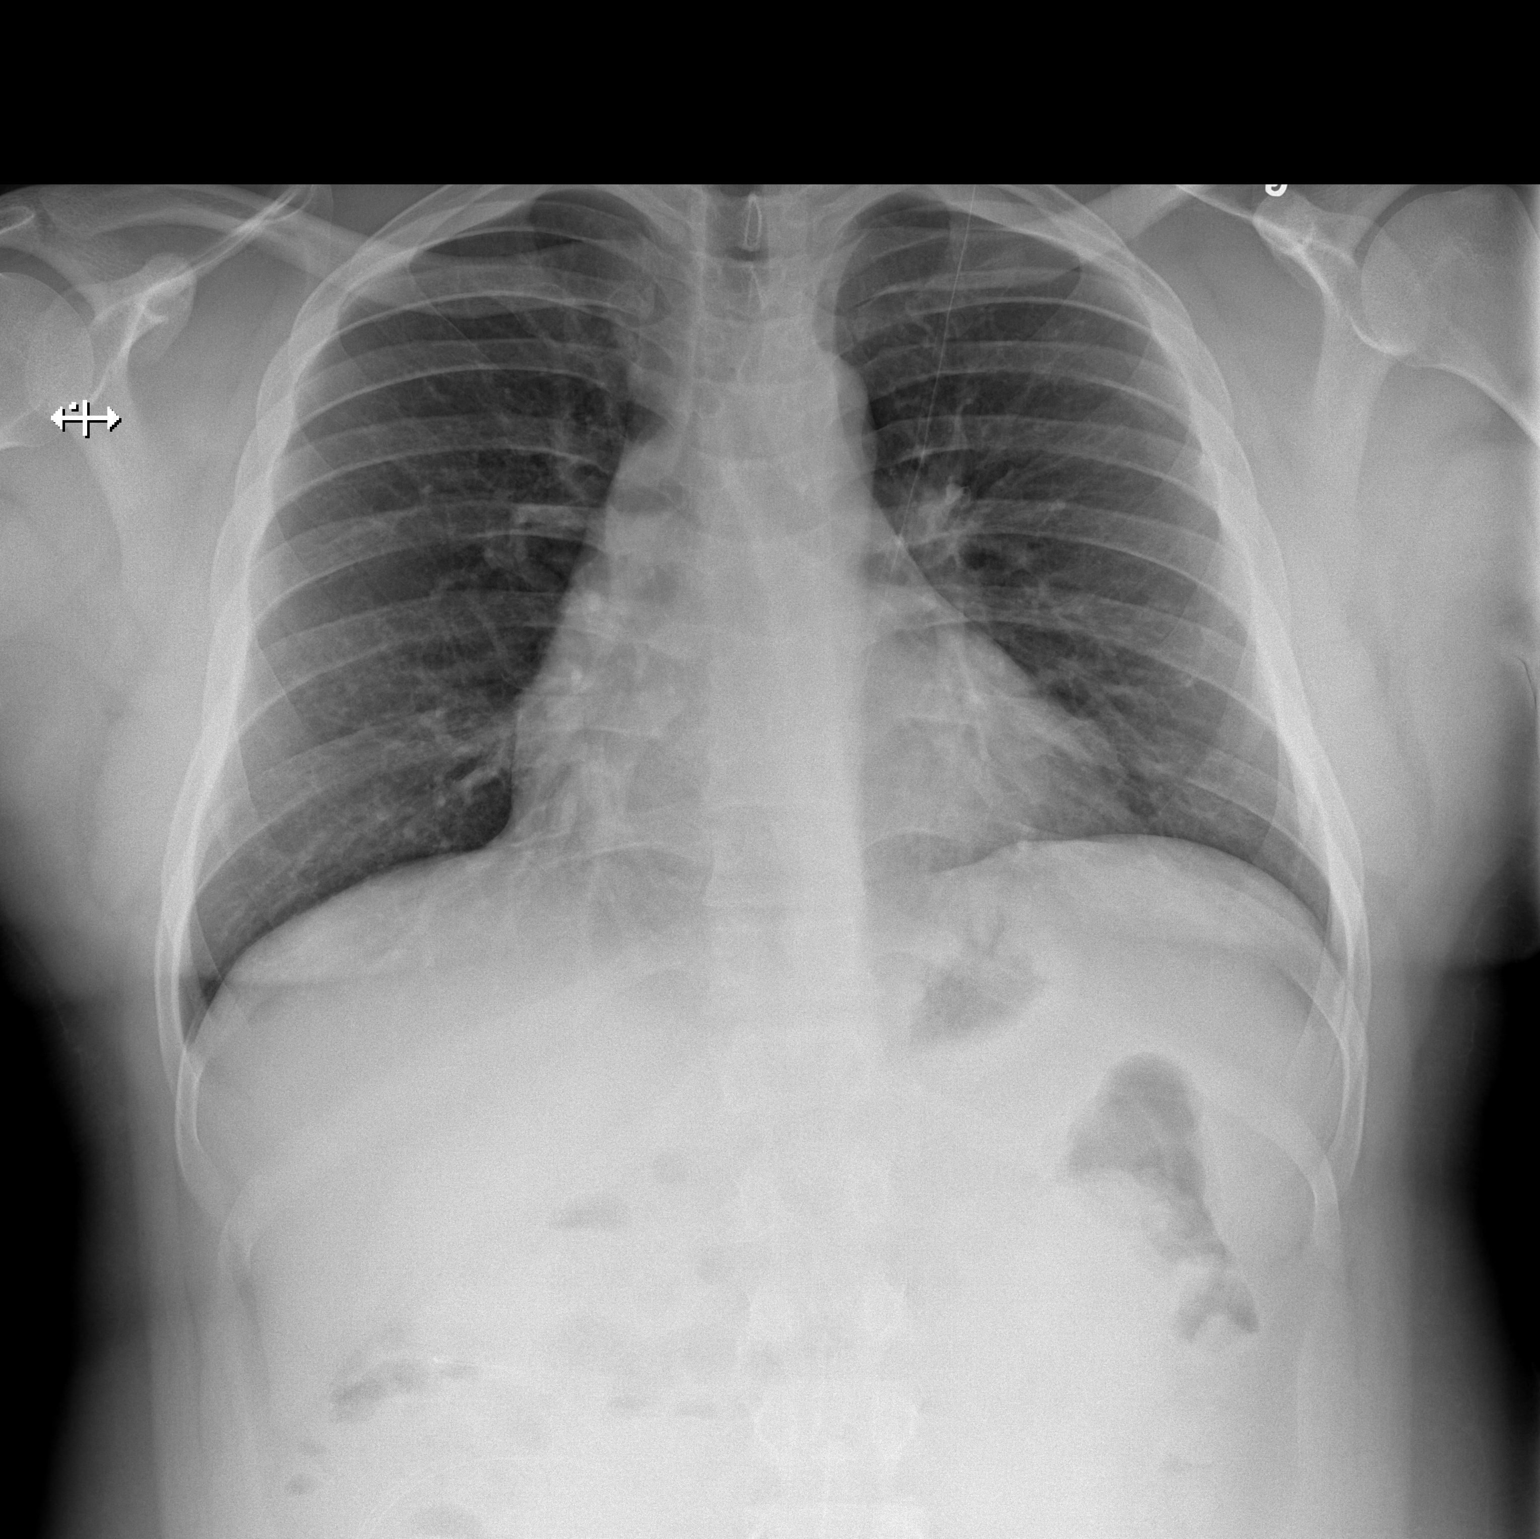

[w chest lat]
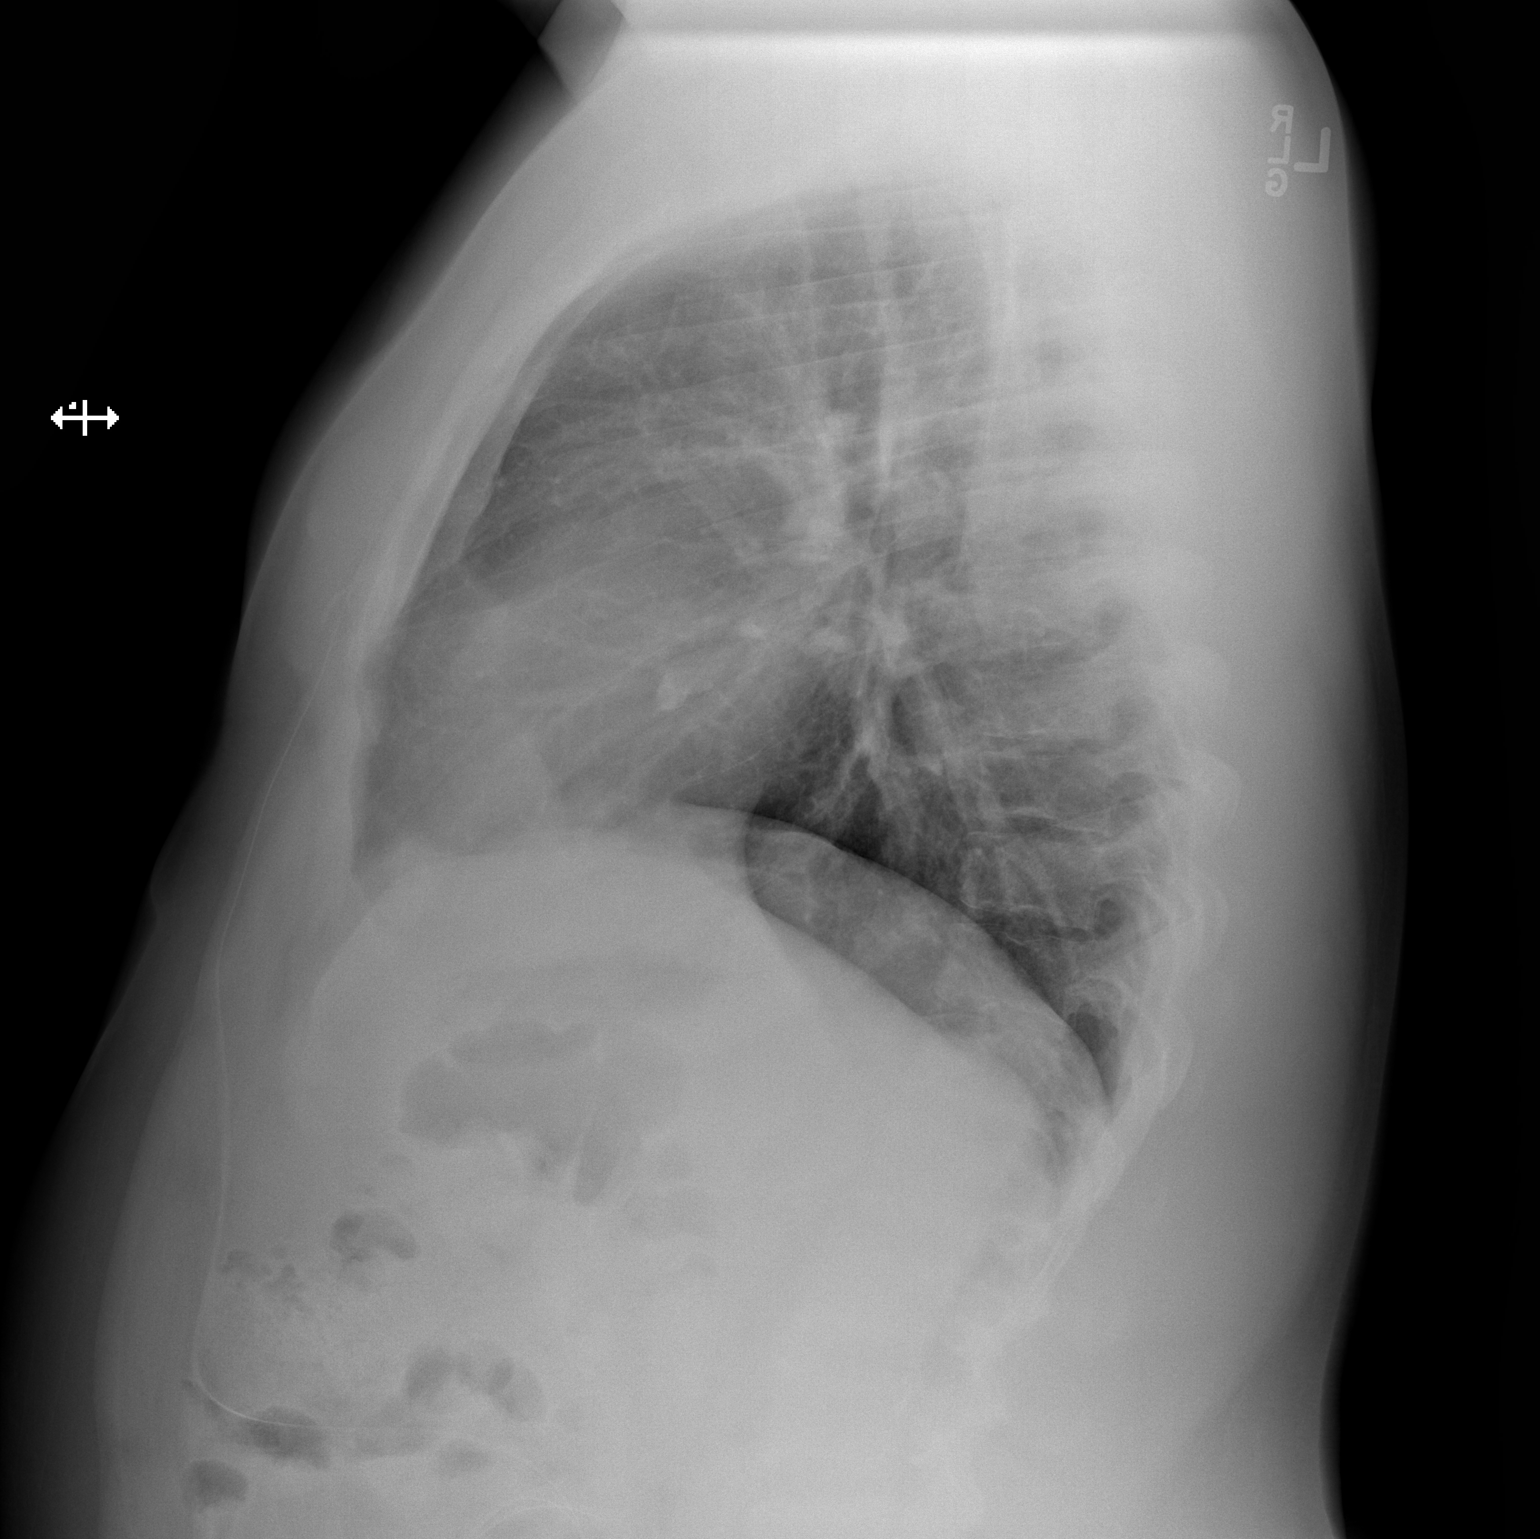

[2 of 2 positions shown; findings below may reference images not displayed]

FINDINGS: Normal heart size and mediastinal contours. No acute infiltrate or
edema. No effusion or pneumothorax. No acute osseous findings.
IMPRESSION: No active cardiopulmonary disease.

## 2017-12-11 ENCOUNTER — Encounter (HOSPITAL_COMMUNITY): Payer: Self-pay | Admitting: Emergency Medicine

## 2017-12-11 ENCOUNTER — Emergency Department (HOSPITAL_COMMUNITY)
Admission: EM | Admit: 2017-12-11 | Discharge: 2017-12-11 | Payer: Medicaid Other | Attending: Emergency Medicine | Admitting: Emergency Medicine

## 2017-12-11 ENCOUNTER — Emergency Department (HOSPITAL_COMMUNITY): Payer: Medicaid Other

## 2017-12-11 ENCOUNTER — Other Ambulatory Visit: Payer: Self-pay

## 2017-12-11 DIAGNOSIS — R112 Nausea with vomiting, unspecified: Secondary | ICD-10-CM | POA: Insufficient documentation

## 2017-12-11 DIAGNOSIS — F17210 Nicotine dependence, cigarettes, uncomplicated: Secondary | ICD-10-CM | POA: Insufficient documentation

## 2017-12-11 DIAGNOSIS — F909 Attention-deficit hyperactivity disorder, unspecified type: Secondary | ICD-10-CM | POA: Diagnosis not present

## 2017-12-11 DIAGNOSIS — R1084 Generalized abdominal pain: Secondary | ICD-10-CM | POA: Insufficient documentation

## 2017-12-11 DIAGNOSIS — Z79899 Other long term (current) drug therapy: Secondary | ICD-10-CM | POA: Diagnosis not present

## 2017-12-11 DIAGNOSIS — K5900 Constipation, unspecified: Secondary | ICD-10-CM | POA: Diagnosis not present

## 2017-12-11 LAB — COMPREHENSIVE METABOLIC PANEL
ALT: 35 U/L (ref 17–63)
AST: 31 U/L (ref 15–41)
Albumin: 4.5 g/dL (ref 3.5–5.0)
Alkaline Phosphatase: 92 U/L (ref 38–126)
Anion gap: 14 (ref 5–15)
BUN: 18 mg/dL (ref 6–20)
CO2: 24 mmol/L (ref 22–32)
Calcium: 9.4 mg/dL (ref 8.9–10.3)
Chloride: 102 mmol/L (ref 101–111)
Creatinine, Ser: 1.31 mg/dL — ABNORMAL HIGH (ref 0.61–1.24)
GFR calc Af Amer: >60 mL/min (ref >60)
GFR calc non Af Amer: >60 mL/min (ref >60)
Glucose, Bld: 125 mg/dL — ABNORMAL HIGH (ref 65–99)
Potassium: 4.1 mmol/L (ref 3.5–5.1)
Sodium: 140 mmol/L (ref 135–145)
Total Bilirubin: 2.2 mg/dL — ABNORMAL HIGH (ref 0.3–1.2)
Total Protein: 8.4 g/dL — ABNORMAL HIGH (ref 6.5–8.1)

## 2017-12-11 LAB — CBC
HCT: 49 % (ref 39.0–52.0)
Hemoglobin: 16.6 g/dL (ref 13.0–17.0)
MCH: 28.3 pg (ref 26.0–34.0)
MCHC: 33.9 g/dL (ref 30.0–36.0)
MCV: 83.6 fL (ref 78.0–100.0)
Platelets: 285 10*3/uL (ref 150–400)
RBC: 5.86 MIL/uL — ABNORMAL HIGH (ref 4.22–5.81)
RDW: 13.4 % (ref 11.5–15.5)
WBC: 15 10*3/uL — ABNORMAL HIGH (ref 4.0–10.5)

## 2017-12-11 LAB — URINALYSIS, ROUTINE W REFLEX MICROSCOPIC
Bilirubin Urine: NEGATIVE
Glucose, UA: NEGATIVE mg/dL
Hgb urine dipstick: NEGATIVE
Ketones, ur: 5 mg/dL — AB
Leukocytes, UA: NEGATIVE
Nitrite: NEGATIVE
Protein, ur: 100 mg/dL — AB
Specific Gravity, Urine: 1.031 — ABNORMAL HIGH (ref 1.005–1.030)
pH: 5 (ref 5.0–8.0)

## 2017-12-11 LAB — LITHIUM LEVEL: Lithium Lvl: <0.06 mmol/L — ABNORMAL LOW (ref 0.60–1.20)

## 2017-12-11 LAB — LIPASE, BLOOD: Lipase: 24 U/L (ref 11–51)

## 2017-12-11 MED ORDER — ONDANSETRON HCL 4 MG/2ML IJ SOLN
4.0000 mg | Freq: Once | INTRAMUSCULAR | Status: AC
  Administered 2017-12-11: 4 mg via INTRAVENOUS
  Filled 2017-12-11: qty 2

## 2017-12-11 MED ORDER — MORPHINE SULFATE (PF) 4 MG/ML IV SOLN
INTRAVENOUS | Status: AC
  Filled 2017-12-11: qty 1

## 2017-12-11 MED ORDER — ACETAMINOPHEN 325 MG PO TABS
650.0000 mg | ORAL_TABLET | Freq: Once | ORAL | Status: AC
Start: 2017-12-11 — End: 2017-12-11
  Administered 2017-12-11: 650 mg via ORAL
  Filled 2017-12-11: qty 2

## 2017-12-11 MED ORDER — SODIUM CHLORIDE 0.9 % IV BOLUS
1000.0000 mL | Freq: Once | INTRAVENOUS | Status: AC
  Administered 2017-12-11: 1000 mL via INTRAVENOUS

## 2017-12-11 MED ORDER — HYDROMORPHONE HCL 1 MG/ML IJ SOLN
1.0000 mg | Freq: Once | INTRAMUSCULAR | Status: AC
  Administered 2017-12-11: 1 mg via INTRAVENOUS
  Filled 2017-12-11: qty 1

## 2017-12-11 MED ORDER — IOPAMIDOL (ISOVUE-300) INJECTION 61%
100.0000 mL | Freq: Once | INTRAVENOUS | Status: AC | PRN
  Administered 2017-12-11: 100 mL via INTRAVENOUS

## 2017-12-11 MED ORDER — IOPAMIDOL (ISOVUE-300) INJECTION 61%
INTRAVENOUS | Status: AC
  Filled 2017-12-11: qty 100

## 2017-12-11 MED ORDER — METRONIDAZOLE IN NACL 5-0.79 MG/ML-% IV SOLN
500.0000 mg | Freq: Once | INTRAVENOUS | Status: DC
  Filled 2017-12-11: qty 100

## 2017-12-11 MED ORDER — MORPHINE SULFATE (PF) 4 MG/ML IV SOLN
4.0000 mg | Freq: Once | INTRAVENOUS | Status: AC
  Administered 2017-12-11: 4 mg via INTRAVENOUS

## 2017-12-11 MED ORDER — CIPROFLOXACIN IN D5W 400 MG/200ML IV SOLN
400.0000 mg | Freq: Once | INTRAVENOUS | Status: AC
  Administered 2017-12-11: 400 mg via INTRAVENOUS
  Filled 2017-12-11: qty 200

## 2017-12-11 NOTE — ED Triage Notes (Addendum)
Pt /mother report that pt has c/o nausea and vomiting x 24 hours. Pt is concerned about his sharp abdominal cramping and c/o constipation. Reports one soft formed stool yesterday. None today. Pt had "brain surgery" at [facility] on 5/7 for removal of V/P shunt. 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 Pt stated that he drank 2 bottles citrate of magnesia and vomited them both. Current emesis is reported as green/brown. Also reports burning on urination. .Pt is alert, oriented, appropriate and ambulatory. Mother at bedside.

## 2017-12-11 NOTE — ED Notes (Signed)
Pt's Mom informed of impending transfer at patients request.

## 2017-12-11 NOTE — ED Notes (Addendum)
Weyman Pedro receiving MD Milo [place] [phone] for Report  [phone] (transport)

## 2017-12-11 NOTE — ED Provider Notes (Addendum)
Complains of diffuse abdominal pain onset 3 days ago accompanied by vomiting, approximately 5 episodes, greenish material..  On exam alert nontoxic abdomen is obese, diffusely tender.  He refused genital exam   Orlie Dakin, MD 12/11/17 1530 4:40 PM patient resting comfortably alert Glasgow Coma Score 15.  He remains mildly tachycardic.  He will be transferred to [facility].  Dr. Weyman Pedro is accepting physician   Orlie Dakin, MD 12/11/17 [phone]

## 2017-12-17 NOTE — ED Provider Notes (Signed)
[facility] DEPT Provider Note   CSN: [id number] Arrival date & time: 12/11/17  1124     History   Chief Complaint Chief Complaint  Patient presents with  . Nausea  . Emesis  . Constipation  . Urinary Tract Infection    burning on urination    HPI Justin Chaney is a 22 y.o. male.  HPI Patient presents to the emergency department with generalized abdominal pain with vomiting that started yesterday.  The patient states that he had a recent drain removed from his ventricle at [facility].  The patient states that nothing seems to make the condition better or worse.  The patient denies chest pain, shortness of breath, headache,blurred vision, neck pain, fever, cough, weakness, numbness, dizziness, anorexia, edema,  diarrhea, rash, back pain, dysuria, hematemesis, bloody stool, near syncope, or syncope. Past Medical History:  Diagnosis Date  . Asthma   . Bipolar disorder ([place])   . 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 Depression   . Homelessness   . Schizophrenia ([place])   . Seizures ([place])    childhood seizures    Patient Active Problem List   Diagnosis Date Noted  . Cocaine use disorder, severe, in early remission ([place]) 12/21/2016  . 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 ADHD (attention deficit hyperactivity disorder) 12/21/2016  . 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 Tobacco use disorder 12/21/2016  . Tobacco use 10/06/2016  . Homicidal ideations 09/16/2016  . Suicidal ideation   . Hallucinations   . Homicidal ideation   . Suicidal thoughts   . Cannabis use disorder, moderate, in sustained remission ([place]) 11/07/2015  . Depression 10/29/2015  . Intermittent explosive disorder 09/05/2015  . Schizoaffective disorder, bipolar type ([place]) 09/04/2015  . Malingering 06/05/2015  . Antisocial personality disorder ([place]) 06/03/2015  . Cocaine abuse ([place]) 06/03/2015    Past Surgical History:  Procedure Laterality Date    . SHUNT REMOVAL    . TONSILLECTOMY    . VENTRICULO-PERITONEAL SHUNT PLACEMENT / LAPAROSCOPIC INSERTION PERITONEAL CATHETER          Home Medications    Prior to Admission medications   Medication Sig Start Date End Date Taking? Authorizing Provider  acetaminophen (TYLENOL) 500 MG tablet Take 500 mg by mouth every 6 (six) hours as needed for moderate pain or headache.   Yes [provider]  ARIPiprazole (ABILIFY) 5 MG tablet Take 5 mg by mouth daily.  11/29/17  Yes [provider]  lithium carbonate (LITHOBID) 300 MG CR tablet Take 300 mg by mouth daily.  10/26/17  Yes [provider]  ARIPiprazole (ABILIFY) 10 MG tablet Take 1 tablet (10 mg total) by mouth at bedtime. For depression Patient not taking: Reported on 12/11/2017 12/30/16   Hughie Closs A, NP  hydrOXYzine (ATARAX/VISTARIL) 25 MG tablet Take 1 tablet (25 mg total) by mouth 3 (three) times daily as needed for anxiety. Patient not taking: Reported on 12/11/2017 12/30/16   Hughie Closs A, NP  lithium carbonate 600 MG capsule Take 1 capsule (600 mgs) in the morning and at supper: For mood stabilization Patient not taking: Reported on 12/11/2017 12/30/16   Hughie Closs A, NP  nicotine (NICODERM CQ - DOSED IN MG/24 HOURS) 21 mg/24hr patch Place 1 patch (21 mg total) onto the skin daily. For smoking ceasation Patient not taking: Reported on 12/11/2017 12/31/16   Hughie Closs A, NP  traZODone (DESYREL) 50 MG tablet Take 1 tablet (50 mg total) by mouth at  bedtime as needed for sleep. Patient not taking: Reported on 12/11/2017 12/30/16   Vicenta Aly, NP    Family History Family History  Problem Relation Age of Onset  . Hypertension Mother   . Mental illness Neg Hx     Social History Social History   Tobacco Use  . Smoking status: Current Every Day Smoker    Packs/day: 0.00    Types: Cigars  . Smokeless tobacco: Never Used  Substance Use Topics  . Alcohol use: No  . Drug use: No     Types: Other-see comments, Amphetamines    Comment: History of use     Allergies   Amoxicillin and Penicillins   Review of Systems Review of Systems All other systems negative except as documented in the HPI. All pertinent positives and negatives as reviewed in the HPI.  Physical Exam Updated Vital Signs BP 114/68 (BP Location: Right Arm)   Pulse (!) 129   Temp (!) 102.1 F (38.9 C) (Oral)   Resp (!) 28   Wt 106.6 kg (235 lb)   SpO2 95%   BMI 41.63 kg/m   Physical Exam  Constitutional: He is oriented to person, place, and time. He appears well-developed and well-nourished. No distress.  HENT:  Head: Normocephalic and atraumatic.  Mouth/Throat: Oropharynx is clear and moist.  Eyes: Pupils are equal, round, and reactive to light.  Neck: Normal range of motion. Neck supple.  Cardiovascular: Normal rate, regular rhythm and normal heart sounds. Exam reveals no gallop and no friction rub.  No murmur heard. Pulmonary/Chest: Effort normal and breath sounds normal. No respiratory distress. He has no wheezes.  Abdominal: Soft. Bowel sounds are normal. He exhibits no distension and no mass. There is tenderness. There is no rebound and no guarding.  Neurological: He is alert and oriented to person, place, and time. He exhibits normal muscle tone. Coordination normal.  Skin: Skin is warm and dry. Capillary refill takes less than 2 seconds. No rash noted. No erythema.  Psychiatric: He has a normal mood and affect. His behavior is normal.  Nursing note and vitals reviewed.    ED Treatments / Results  Labs (all labs ordered are listed, but only abnormal results are displayed) Labs Reviewed  COMPREHENSIVE METABOLIC PANEL - Abnormal; Notable for the following components:      Result Value   Glucose, Bld 125 (*)    Creatinine, Ser 1.31 (*)    Total Protein 8.4 (*)    Total Bilirubin 2.2 (*)    All other components within normal limits  CBC - Abnormal; Notable for the following  components:   WBC 15.0 (*)    RBC 5.86 (*)    All other components within normal limits  URINALYSIS, ROUTINE W REFLEX MICROSCOPIC - Abnormal; Notable for the following components:   Color, Urine AMBER (*)    APPearance HAZY (*)    Specific Gravity, Urine 1.031 (*)    Ketones, ur 5 (*)    Protein, ur 100 (*)    Bacteria, UA FEW (*)    All other components within normal limits  LITHIUM LEVEL - Abnormal; Notable for the following components:   Lithium Lvl <0.06 (*)    All other components within normal limits  LIPASE, BLOOD    EKG None  Radiology No results found.  Procedures Procedures (including critical care time)  Medications Ordered in ED Medications  ondansetron (ZOFRAN) injection 4 mg (4 mg Intravenous Given 12/11/17 1353)  morphine 4 MG/ML injection 4  mg (4 mg Intravenous Given 12/11/17 1352)  iopamidol (ISOVUE-300) 61 % injection 100 mL (100 mLs Intravenous Contrast Given 12/11/17 1419)  sodium chloride 0.9 % bolus 1,000 mL (0 mLs Intravenous Stopped 12/11/17 1702)  HYDROmorphone (DILAUDID) injection 1 mg (1 mg Intravenous Given 12/11/17 1600)  ciprofloxacin (CIPRO) IVPB 400 mg (400 mg Intravenous Transfusing/Transfer 12/11/17 1705)  acetaminophen (TYLENOL) tablet 650 mg (650 mg Oral Given 12/11/17 1659)     Initial Impression / Assessment and Plan / ED Course  I have reviewed the triage vital signs and the nursing notes.  Pertinent labs & imaging results that were available during my care of the patient were reviewed by me and considered in my medical decision making (see chart for details).   Patient will be referred back to [facility] based on the fact that he still has a portion of this shunt in his abdomen which could be causing a potential source of infection.  I spoken with general surgery here who felt that the patient will need to be back at [facility] for further evaluation care of this issue.  I spoke with the on-call neurosurgeon at [facility] who agreed to accept the patient in  transfer.  Patient is advised the plan and all questions were answered.    Final Clinical Impressions(s) / ED Diagnoses   Final diagnoses:  Generalized abdominal pain    ED Discharge Orders    None       Dalia Heading, PA-C 12/17/17 [place], MD 12/17/17 1459

## 2018-01-03 ENCOUNTER — Other Ambulatory Visit: Payer: Self-pay

## 2018-01-03 ENCOUNTER — Encounter (HOSPITAL_COMMUNITY): Payer: Self-pay

## 2018-01-03 ENCOUNTER — Emergency Department (HOSPITAL_COMMUNITY)
Admission: EM | Admit: 2018-01-03 | Discharge: 2018-01-03 | Disposition: A | Discharge status: Home / Self Care | Payer: Medicaid Other | Attending: Emergency Medicine | Admitting: Emergency Medicine

## 2018-01-03 DIAGNOSIS — Z79899 Other long term (current) drug therapy: Secondary | ICD-10-CM | POA: Insufficient documentation

## 2018-01-03 DIAGNOSIS — J45909 Unspecified asthma, uncomplicated: Secondary | ICD-10-CM | POA: Insufficient documentation

## 2018-01-03 DIAGNOSIS — J029 Acute pharyngitis, unspecified: Secondary | ICD-10-CM | POA: Diagnosis present

## 2018-01-03 DIAGNOSIS — F17290 Nicotine dependence, other tobacco product, uncomplicated: Secondary | ICD-10-CM | POA: Diagnosis not present

## 2018-01-03 DIAGNOSIS — R1110 Vomiting, unspecified: Secondary | ICD-10-CM | POA: Diagnosis not present

## 2018-01-03 LAB — COMPREHENSIVE METABOLIC PANEL WITH GFR
ALT: 38 U/L (ref 17–63)
AST: 30 U/L (ref 15–41)
Albumin: 3.6 g/dL (ref 3.5–5.0)
Alkaline Phosphatase: 81 U/L (ref 38–126)
Anion gap: 7 (ref 5–15)
BUN: 10 mg/dL (ref 6–20)
CO2: 27 mmol/L (ref 22–32)
Calcium: 9 mg/dL (ref 8.9–10.3)
Chloride: 107 mmol/L (ref 101–111)
Creatinine, Ser: 0.72 mg/dL (ref 0.61–1.24)
GFR calc Af Amer: >60 mL/min
GFR calc non Af Amer: >60 mL/min
Glucose, Bld: 105 mg/dL — ABNORMAL HIGH (ref 65–99)
Potassium: 3.7 mmol/L (ref 3.5–5.1)
Sodium: 141 mmol/L (ref 135–145)
Total Bilirubin: 0.6 mg/dL (ref 0.3–1.2)
Total Protein: 7.3 g/dL (ref 6.5–8.1)

## 2018-01-03 LAB — CBC
HCT: 37.8 % — ABNORMAL LOW (ref 39.0–52.0)
Hemoglobin: 12.2 g/dL — ABNORMAL LOW (ref 13.0–17.0)
MCH: 27.2 pg (ref 26.0–34.0)
MCHC: 32.3 g/dL (ref 30.0–36.0)
MCV: 84.2 fL (ref 78.0–100.0)
Platelets: 304 10*3/uL (ref 150–400)
RBC: 4.49 MIL/uL (ref 4.22–5.81)
RDW: 13.4 % (ref 11.5–15.5)
WBC: 8 10*3/uL (ref 4.0–10.5)

## 2018-01-03 LAB — LIPASE, BLOOD: Lipase: 29 U/L (ref 11–51)

## 2018-01-03 MED ORDER — DEXAMETHASONE SODIUM PHOSPHATE 10 MG/ML IJ SOLN
10.0000 mg | Freq: Once | INTRAMUSCULAR | Status: AC
Start: 2018-01-03 — End: 2018-01-03
  Administered 2018-01-03: 10 mg via INTRAMUSCULAR
  Filled 2018-01-03: qty 1

## 2018-01-03 MED ORDER — ACETAMINOPHEN 325 MG PO TABS: 650.0000 mg | ORAL_TABLET | Freq: Four times a day (QID) | ORAL | 0 refills | Status: DC | PRN

## 2018-01-03 MED ORDER — KETOROLAC TROMETHAMINE 30 MG/ML IJ SOLN
30.0000 mg | Freq: Once | INTRAMUSCULAR | Status: AC
Start: 2018-01-03 — End: 2018-01-03
  Administered 2018-01-03: 30 mg via INTRAMUSCULAR
  Filled 2018-01-03: qty 1

## 2018-01-03 NOTE — ED Provider Notes (Signed)
[facility] DEPT Provider Note   CSN: [id number] Arrival date & time: 01/03/18  1836     History   Chief Complaint Chief Complaint  Patient presents with  . Emesis  . Sore Throat    HPI Justin Chaney is a 22 y.o. male.  HPI  Patient is a 22 year old male with a history of bipolar disorder, schizophrenia, VP shunt (cranial portion removed), presenting for sore throat and episodes of vomiting.  Patient reports over the past 2 days, he has had sore throat with swallowing, subjective chills, as well as 2 episodes of emesis yesterday, nonbilious and nonbloody, and one episode of emesis today.  Patient denies any obstructive swallowing or difficulty breathing.  Patient denies any change in voice quality.  Patient denies any abdominal pain, nausea, vomiting, or diarrhea today.  Patient reports that one month ago he had his appendix removed, and was healing well.  Patient denies any recent head trauma or headaches.  No known sick exposures.  Patient reports recent cocaine use, but no marijuana use.  Patient denies any antipyretics prior to arrival.  Past Medical History:  Diagnosis Date  . Asthma   . Bipolar disorder ([place])   . 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 Depression   . Homelessness   . Schizophrenia ([place])   . Seizures ([place])    childhood seizures    Patient Active Problem List   Diagnosis Date Noted  . Cocaine use disorder, severe, in early remission ([place]) 12/21/2016  . 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 ADHD (attention deficit hyperactivity disorder) 12/21/2016  . 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 Tobacco use disorder 12/21/2016  . Tobacco use 10/06/2016  . Homicidal ideations 09/16/2016  . Suicidal ideation   . Hallucinations   . Homicidal ideation   . Suicidal thoughts   . Cannabis use disorder, moderate, in sustained remission ([place]) 11/07/2015  . Depression 10/29/2015  . Intermittent explosive  disorder 09/05/2015  . Schizoaffective disorder, bipolar type ([place]) 09/04/2015  . Malingering 06/05/2015  . Antisocial personality disorder ([place]) 06/03/2015  . Cocaine abuse ([place]) 06/03/2015    Past Surgical History:  Procedure Laterality Date  . APPENDECTOMY    . SHUNT REMOVAL    . TONSILLECTOMY    . VENTRICULO-PERITONEAL SHUNT PLACEMENT / LAPAROSCOPIC INSERTION PERITONEAL CATHETER          Home Medications    Prior to Admission medications   Medication Sig Start Date End Date Taking? Authorizing Provider  acetaminophen (TYLENOL) 500 MG tablet Take 500 mg by mouth every 6 (six) hours as needed for moderate pain or headache.   Yes [provider]  ARIPiprazole (ABILIFY) 5 MG tablet Take 5 mg by mouth daily.  11/29/17  Yes [provider]  lithium carbonate (LITHOBID) 300 MG CR tablet Take 300 mg by mouth daily.  10/26/17  Yes [provider]  ARIPiprazole (ABILIFY) 10 MG tablet Take 1 tablet (10 mg total) by mouth at bedtime. For depression Patient not taking: Reported on 12/11/2017 12/30/16   Hughie Closs A, NP  hydrOXYzine (ATARAX/VISTARIL) 25 MG tablet Take 1 tablet (25 mg total) by mouth 3 (three) times daily as needed for anxiety. Patient not taking: Reported on 12/11/2017 12/30/16   Hughie Closs A, NP  lithium carbonate 600 MG capsule Take 1 capsule (600 mgs) in the morning and at supper: For mood stabilization Patient not taking: Reported on 12/11/2017 12/30/16   Hughie Closs A, NP  nicotine (NICODERM  CQ - DOSED IN MG/24 HOURS) 21 mg/24hr patch Place 1 patch (21 mg total) onto the skin daily. For smoking ceasation Patient not taking: Reported on 12/11/2017 12/31/16   Hughie Closs A, NP  traZODone (DESYREL) 50 MG tablet Take 1 tablet (50 mg total) by mouth at bedtime as needed for sleep. Patient not taking: Reported on 12/11/2017 12/30/16   Vicenta Aly, NP    Family History Family History  Problem Relation Age of Onset  . Hypertension  Mother   . Mental illness Neg Hx     Social History Social History   Tobacco Use  . Smoking status: Current Every Day Smoker    Packs/day: 0.00    Types: Cigars  . Smokeless tobacco: Never Used  Substance Use Topics  . Alcohol use: No  . Drug use: No    Types: Other-see comments, Amphetamines    Comment: History of use     Allergies   Amoxicillin and Penicillins   Review of Systems Review of Systems  Constitutional: Positive for chills. Negative for fever.  HENT: Positive for sore throat. Negative for trouble swallowing.   Respiratory: Negative for shortness of breath, wheezing and stridor.   Gastrointestinal: Positive for vomiting. Negative for abdominal pain and nausea.  Skin: Negative for color change and wound.  Neurological: Negative for dizziness, weakness, light-headedness and headaches.  All other systems reviewed and are negative.    Physical Exam Updated Vital Signs BP 121/74   Pulse 73   Temp 98.3 F (36.8 C) (Oral)   Resp 16   Ht 5\' 3"  (1.6 m)   Wt 99.8 kg (220 lb)   SpO2 99%   BMI 38.97 kg/m   Physical Exam  Constitutional: He appears well-developed and well-nourished. No distress.  HENT:  Head: Normocephalic and atraumatic.  Normal phonation. No muffled voice sounds. Patient swallows secretions without difficulty. Dentition normal. No lesions of tongue or buccal mucosa. Uvula midline. No asymmetric swelling of the posterior pharynx. Mild erythema of posterior pharynx.  Tonsils are surgically absent.  No lingual swelling. No induration inferior to tongue. No submandibular tenderness, swelling, or induration.  Tissues of the neck supple. cervical lymphadenopathy. Right TM without erythema or effusion; left TM without erythema or effusion.  Eyes: Pupils are equal, round, and reactive to light. Conjunctivae and EOM are normal.  Neck: Normal range of motion. Neck supple.  Cardiovascular: Normal rate, regular rhythm, S1 normal and S2 normal.  No  murmur heard. Pulmonary/Chest: Effort normal and breath sounds normal. He has no wheezes. He has no rales.  Abdominal: Soft. He exhibits no distension. There is no tenderness. There is no guarding.  Patient has well-healed laparoscopic incision scars.   Musculoskeletal: Normal range of motion. He exhibits no edema or deformity.  Lymphadenopathy:    He has no cervical adenopathy.  Neurological: He is alert.  Cranial nerves grossly intact. Patient moves extremities symmetrically and with good coordination.  Skin: Skin is warm and dry. No rash noted. No erythema.  Psychiatric: He has a normal mood and affect. His behavior is normal. Judgment and thought content normal.  Nursing note and vitals reviewed.    ED Treatments / Results  Labs (all labs ordered are listed, but only abnormal results are displayed) Labs Reviewed  COMPREHENSIVE METABOLIC PANEL - Abnormal; Notable for the following components:      Result Value   Glucose, Bld 105 (*)    All other components within normal limits  CBC - Abnormal; Notable for the  following components:   Hemoglobin 12.2 (*)    HCT 37.8 (*)    All other components within normal limits  LIPASE, BLOOD    EKG None  Radiology No results found.  Procedures Procedures (including critical care time)  Medications Ordered in ED Medications  dexamethasone (DECADRON) injection 10 mg (10 mg Intramuscular Given 01/03/18 2116)  ketorolac (TORADOL) 30 MG/ML injection 30 mg (30 mg Intramuscular Given 01/03/18 2115)     Initial Impression / Assessment and Plan / ED Course  I have reviewed the triage vital signs and the nursing notes.  Pertinent labs & imaging results that were available during my care of the patient were reviewed by me and considered in my medical decision making (see chart for details).     Patient is nontoxic-appearing, afebrile, and in no acute distress.  Airway is intact.  No evidence of PTA, RPA, or Ludwig's angina.  Patient had 3  episodes of emesis, and emesis is not intractable.  Patient's lab scopic appendectomy was approaching 4 weeks ago, therefore doubt extubation related throat discomfort.  Patient without nausea or emesis at this time.  Patient had no recent head trauma, has had the cranial portion of his VP shunt removed, therefore doubt intracranial pressure as cause of patient's vomiting.  Additionally, abdomen is benign and nonsurgical, and patient has well-healing laparoscopic incision scars.  Lab work unremarkable.  Patient treated emergency department with single doses of Toradol and Decadron, and reports complete symptom medic improvement.  Patient tolerating p.o. in emergency department without difficulty.  Instructed patient to use Tylenol for throat discomfort, and avoid ibuprofen due to interaction with lithium.  Patient given return precautions for any difficulty breathing, difficulty swallowing, or intractable nausea or vomiting.  Patient is in understanding and agrees the plan of care.  Final Clinical Impressions(s) / ED Diagnoses   Final diagnoses:  Viral pharyngitis  Vomiting, intractability of vomiting not specified, presence of nausea not specified, unspecified vomiting type    ED Discharge Orders        Ordered    acetaminophen (TYLENOL) 325 MG tablet  Every 6 hours PRN     01/03/18 2202       Albesa Seen, PA-C 01/04/18 Aviva Signs, MD 01/04/18 [phone]

## 2018-01-03 NOTE — ED Notes (Signed)
Patient states that he ate and drank without any problems or issuses

## 2018-01-03 NOTE — Discharge Instructions (Signed)
Please read and follow all provided instructions.  Your diagnoses today include:  1. Viral pharyngitis   2. Vomiting, intractability of vomiting not specified, presence of nausea not specified, unspecified vomiting type     You appear to have an upper respiratory infection (URI). An upper respiratory tract infection, or cold, is a viral infection of the air passages leading to the lungs. It should improve gradually after 5-7 days. You may have a lingering cough that lasts for 2- 4 weeks after the infection.  Tests performed today include: Vital signs. See below for your results today.   Medications prescribed:   Take any prescribed medications only as directed. Treatment for your infection is aimed at treating the symptoms. There are no medications, such as antibiotics, that will cure your infection.   Home care instructions:  Follow any educational materials contained in this packet.   Your illness is contagious and can be spread to others, especially during the first 3 or 4 days. It cannot be cured by antibiotics or other medicines. Take basic precautions such as washing your hands often, covering your mouth when you cough or sneeze, and avoiding public places where you could spread your illness to others.   Please continue drinking plenty of fluids.  Use over-the-counter medicines as needed as directed on packaging for symptom relief.  Please use Tylenol.  Ibuprofen can interact with your lithium.  Follow-up instructions: Please follow-up with your primary care provider in the next 3 days for further evaluation of your symptoms if you are not feeling better.   Return instructions:  Please return to the Emergency Department if you experience worsening symptoms.  RETURN IMMEDIATELY IF you develop shortness of breath, chest pain, confusion or altered mental status, a new rash, become dizzy, faint, or poorly responsive, or are unable to be cared for at home. Please return if you have  persistent vomiting and cannot keep down fluids or develop a fever that is not controlled by tylenol or motrin.   Please return if you have any other emergent concerns.  Additional Information:  Your vital signs today were: BP 121/74    Pulse 73    Temp 98.3 F (36.8 C) (Oral)    Resp 16    Ht 5\' 3"  (1.6 m)    Wt 99.8 kg (220 lb)    SpO2 99%    BMI 38.97 kg/m  If your blood pressure (BP) was elevated above 135/85 this visit, please have this repeated by your doctor within one month. --------------

## 2018-01-03 NOTE — ED Triage Notes (Signed)
Patient c/o sore throat 3-4 days ago and  Vomiting since yesterday x 3-4 times. Patient states he ha been using sore throat spray, but not working.

## 2018-03-07 ENCOUNTER — Emergency Department (HOSPITAL_COMMUNITY)
Admission: EM | Admit: 2018-03-07 | Discharge: 2018-03-08 | Disposition: A | Discharge status: Home / Self Care | Payer: Medicaid Other | Attending: Emergency Medicine | Admitting: Emergency Medicine

## 2018-03-07 ENCOUNTER — Encounter (HOSPITAL_COMMUNITY): Payer: Self-pay | Admitting: Emergency Medicine

## 2018-03-07 ENCOUNTER — Other Ambulatory Visit: Payer: Self-pay

## 2018-03-07 DIAGNOSIS — F333 Major depressive disorder, recurrent, severe with psychotic symptoms: Secondary | ICD-10-CM | POA: Insufficient documentation

## 2018-03-07 DIAGNOSIS — R45851 Suicidal ideations: Secondary | ICD-10-CM | POA: Insufficient documentation

## 2018-03-07 DIAGNOSIS — F1910 Other psychoactive substance abuse, uncomplicated: Secondary | ICD-10-CM | POA: Diagnosis present

## 2018-03-07 DIAGNOSIS — F17210 Nicotine dependence, cigarettes, uncomplicated: Secondary | ICD-10-CM | POA: Insufficient documentation

## 2018-03-07 DIAGNOSIS — J45909 Unspecified asthma, uncomplicated: Secondary | ICD-10-CM | POA: Insufficient documentation

## 2018-03-07 LAB — CBC
HCT: 41.7 % (ref 39.0–52.0)
Hemoglobin: 13.5 g/dL (ref 13.0–17.0)
MCH: 27.4 pg (ref 26.0–34.0)
MCHC: 32.4 g/dL (ref 30.0–36.0)
MCV: 84.8 fL (ref 78.0–100.0)
Platelets: 285 10*3/uL (ref 150–400)
RBC: 4.92 MIL/uL (ref 4.22–5.81)
RDW: 12.3 % (ref 11.5–15.5)
WBC: 7.1 10*3/uL (ref 4.0–10.5)

## 2018-03-07 LAB — RAPID URINE DRUG SCREEN, HOSP PERFORMED
Amphetamines: POSITIVE — AB
Barbiturates: NOT DETECTED
Benzodiazepines: NOT DETECTED
Cocaine: POSITIVE — AB
Opiates: NOT DETECTED
Tetrahydrocannabinol: NOT DETECTED

## 2018-03-07 LAB — COMPREHENSIVE METABOLIC PANEL
ALT: 25 U/L (ref 0–44)
AST: 25 U/L (ref 15–41)
Albumin: 3.7 g/dL (ref 3.5–5.0)
Alkaline Phosphatase: 91 U/L (ref 38–126)
Anion gap: 12 (ref 5–15)
BUN: 11 mg/dL (ref 6–20)
CO2: 21 mmol/L — ABNORMAL LOW (ref 22–32)
Calcium: 9.2 mg/dL (ref 8.9–10.3)
Chloride: 107 mmol/L (ref 98–111)
Creatinine, Ser: 0.83 mg/dL (ref 0.61–1.24)
GFR calc Af Amer: >60 mL/min (ref >60)
GFR calc non Af Amer: >60 mL/min (ref >60)
Glucose, Bld: 131 mg/dL — ABNORMAL HIGH (ref 70–99)
Potassium: 3.5 mmol/L (ref 3.5–5.1)
Sodium: 140 mmol/L (ref 135–145)
Total Bilirubin: 0.5 mg/dL (ref 0.3–1.2)
Total Protein: 7.1 g/dL (ref 6.5–8.1)

## 2018-03-07 LAB — ACETAMINOPHEN LEVEL: Acetaminophen (Tylenol), Serum: <10 ug/mL — ABNORMAL LOW (ref 10–30)

## 2018-03-07 LAB — SALICYLATE LEVEL: Salicylate Lvl: <7 mg/dL (ref 2.8–30.0)

## 2018-03-07 LAB — ETHANOL: Alcohol, Ethyl (B): <10 mg/dL (ref <10)

## 2018-03-07 MED ORDER — ACETAMINOPHEN 325 MG PO TABS: 650.0000 mg | ORAL_TABLET | ORAL | Status: DC | PRN

## 2018-03-07 NOTE — BH Assessment (Signed)
[place] [facility] Assessment Progress Note    TTS has attempted to see patient twice in the past hour, but RN has been tied up with an overdose and a critical patient.  When patient is ready to be seen, ED Staff can call [phone].

## 2018-03-07 NOTE — ED Notes (Addendum)
Patient is standing up in hallway near RN station; pt asked to go into room so RN can call report on another patient; patient states he is not worried about anyone else and continues to stand in hallway with aggressive stance;Patient requesting to go through  Belongings to ensure everything is there, patient advised he will not be able to go through belongings but everything will be inventoried; Patient is redirect to room by [facility]

## 2018-03-07 NOTE — ED Provider Notes (Signed)
[facility] EMERGENCY DEPARTMENT Provider Note   CSN: [id number] Arrival date & time: 03/07/18  1518     History   Chief Complaint Chief Complaint  Patient presents with  . Suicidal  . Addiction Problem    HPI Justin Chaney is a 22 y.o. male.  Justin Chaney is a 22 y.o. Male with a history of asthma, childhood seizures and hydrocephalus s/p VP shunt, bipolar disorder and schizophrenia, who presents to the emergency department for evaluation of suicidal ideations and substance use.  Patient reports 2 days ago he attempted to hang himself, and continues to have suicidal ideations with thoughts of overdosing.  Patient reports for the past month he has been heavily using meth and cocaine, last use was approximately yesterday.  Patient continues to express thoughts of depression and no longer wanting to live.  He denies any HI or AVH.  Reports prior suicide attempts in the past, and psychiatric hospitalizations.  Patient denies any focal medical complaints, no headache, vision changes, no neck pain or injury from attempting to hang himself.  Patient denies any chest pain or shortness of breath, did not experience any chest pain while using cocaine.  Denies any lacerations or abrasions, no pain, numbness or weakness in his extremities.     Past Medical History:  Diagnosis Date  . Asthma   . Bipolar disorder ([place])   . 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 Depression   . Homelessness   . Schizophrenia ([place])   . Seizures ([place])    childhood seizures    Patient Active Problem List   Diagnosis Date Noted  . Cocaine use disorder, severe, in early remission ([place]) 12/21/2016  . 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 ADHD (attention deficit hyperactivity disorder) 12/21/2016  . 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 Tobacco use disorder 12/21/2016  . Tobacco use 10/06/2016  . Homicidal ideations 09/16/2016  . Suicidal ideation   .  Hallucinations   . Homicidal ideation   . Suicidal thoughts   . Cannabis use disorder, moderate, in sustained remission ([place]) 11/07/2015  . Depression 10/29/2015  . Intermittent explosive disorder 09/05/2015  . Schizoaffective disorder, bipolar type ([place]) 09/04/2015  . Malingering 06/05/2015  . Antisocial personality disorder ([place]) 06/03/2015  . Cocaine abuse ([place]) 06/03/2015    Past Surgical History:  Procedure Laterality Date  . APPENDECTOMY    . SHUNT REMOVAL    . TONSILLECTOMY    . VENTRICULO-PERITONEAL SHUNT PLACEMENT / LAPAROSCOPIC INSERTION PERITONEAL CATHETER          Home Medications    Prior to Admission medications   Medication Sig Start Date End Date Taking? Authorizing Provider  acetaminophen (TYLENOL) 325 MG tablet Take 2 tablets (650 mg total) by mouth every 6 (six) hours as needed. 01/03/18   Langston Masker B, PA-C  ARIPiprazole (ABILIFY) 10 MG tablet Take 1 tablet (10 mg total) by mouth at bedtime. For depression Patient not taking: Reported on 12/11/2017 12/30/16   Hughie Closs A, NP  ARIPiprazole (ABILIFY) 5 MG tablet Take 5 mg by mouth daily.  11/29/17   [provider]  hydrOXYzine (ATARAX/VISTARIL) 25 MG tablet Take 1 tablet (25 mg total) by mouth 3 (three) times daily as needed for anxiety. Patient not taking: Reported on 12/11/2017 12/30/16   Hughie Closs A, NP  lithium carbonate (LITHOBID) 300 MG CR tablet Take 300 mg by mouth daily.  10/26/17   [provider]  lithium carbonate 600 MG capsule  Take 1 capsule (600 mgs) in the morning and at supper: For mood stabilization Patient not taking: Reported on 12/11/2017 12/30/16   Hughie Closs A, NP  nicotine (NICODERM CQ - DOSED IN MG/24 HOURS) 21 mg/24hr patch Place 1 patch (21 mg total) onto the skin daily. For smoking ceasation Patient not taking: Reported on 12/11/2017 12/31/16   Hughie Closs A, NP  traZODone (DESYREL) 50 MG tablet Take 1 tablet (50 mg total) by mouth at bedtime as  needed for sleep. Patient not taking: Reported on 12/11/2017 12/30/16   Vicenta Aly, NP    Family History Family History  Problem Relation Age of Onset  . Hypertension Mother   . Mental illness Neg Hx     Social History Social History   Tobacco Use  . Smoking status: Current Every Day Smoker    Packs/day: 0.00    Types: Cigars  . Smokeless tobacco: Never Used  Substance Use Topics  . Alcohol use: No  . Drug use: No    Types: Other-see comments, Amphetamines    Comment: History of use     Allergies   Amoxicillin and Penicillins   Review of Systems Review of Systems  Constitutional: Negative for chills and fever.  HENT: Negative for congestion, rhinorrhea and sore throat.   Eyes: Negative for pain and visual disturbance.  Respiratory: Negative for cough and shortness of breath.   Cardiovascular: Negative for chest pain.  Gastrointestinal: Negative for abdominal pain, diarrhea, nausea and vomiting.  Genitourinary: Negative for dysuria and frequency.  Musculoskeletal: Negative for arthralgias, back pain, myalgias and neck pain.  Skin: Negative for color change and rash.  Neurological: Negative for dizziness, weakness, light-headedness, numbness and headaches.  Psychiatric/Behavioral: Positive for dysphoric mood, self-injury and suicidal ideas. Negative for behavioral problems and hallucinations.     Physical Exam Updated Vital Signs BP 128/75 (BP Location: Right Arm)   Pulse (!) 106   Temp 98.8 F (37.1 C) (Oral)   Resp 16   Ht 5\' 3"  (1.6 m)   SpO2 94%   BMI 38.97 kg/m   Physical Exam  Constitutional: He appears well-developed and well-nourished. No distress.  HENT:  Head: Normocephalic and atraumatic.  Mouth/Throat: Oropharynx is clear and moist.  Eyes: Pupils are equal, round, and reactive to light. EOM are normal. Right eye exhibits no discharge. Left eye exhibits no discharge.  Neck: Normal range of motion. Neck supple.  Tenderness to palpation  over the anterior posterior neck, no bruising, no crepitus, no stridor, no midline cervical spine tenderness.  Cardiovascular: Normal rate, regular rhythm, normal heart sounds and intact distal pulses.  Pulmonary/Chest: Effort normal and breath sounds normal. No respiratory distress. He has no wheezes. He has no rales.  Respirations equal and unlabored, patient able to speak in full sentences, lungs clear to auscultation bilaterally  Abdominal: Soft. Bowel sounds are normal. He exhibits no distension and no mass. There is no tenderness. There is no guarding.  Abdomen soft, nondistended, nontender to palpation in all quadrants without guarding or peritoneal signs  Musculoskeletal: He exhibits no edema or deformity.  Neurological: He is alert. Coordination normal.  Speech is clear, able to follow commands CN III-XII intact Normal strength in upper and lower extremities bilaterally including dorsiflexion and plantar flexion, strong and equal grip strength Sensation normal to light and sharp touch Moves extremities without ataxia, coordination intact  Skin: Skin is warm and dry. Capillary refill takes less than 2 seconds. He is not diaphoretic.  Psychiatric: His affect  is blunt. His speech is delayed. He is slowed. He is not actively hallucinating. Thought content is not delusional. He exhibits a depressed mood. He expresses suicidal ideation. He expresses no homicidal ideation. He expresses suicidal plans. He expresses no homicidal plans.  Nursing note and vitals reviewed.    ED Treatments / Results  Labs (all labs ordered are listed, but only abnormal results are displayed) Labs Reviewed  COMPREHENSIVE METABOLIC PANEL - Abnormal; Notable for the following components:      Result Value   CO2 21 (*)    Glucose, Bld 131 (*)    All other components within normal limits  ACETAMINOPHEN LEVEL - Abnormal; Notable for the following components:   Acetaminophen (Tylenol), Serum <10 (*)    All other  components within normal limits  RAPID URINE DRUG SCREEN, HOSP PERFORMED - Abnormal; Notable for the following components:   Cocaine POSITIVE (*)    Amphetamines POSITIVE (*)    All other components within normal limits  ETHANOL  SALICYLATE LEVEL  CBC  LITHIUM LEVEL    EKG None  Radiology No results found.  Procedures Procedures (including critical care time)  Medications Ordered in ED Medications - No data to display   Initial Impression / Assessment and Plan / ED Course  I have reviewed the triage vital signs and the nursing notes.  Pertinent labs & imaging results that were available during my care of the patient were reviewed by me and considered in my medical decision making (see chart for details).  Patient presents for evaluation of suicidal ideations, patient reports he attempted to hang himself 2 days ago, did not sustain any injury to the neck, exam reveals no crepitus, spinal tenderness or stridor.  He reports he continues to have suicidal ideations with plans to overdose, has been using meth and cocaine heavily over the past month.  Denies any HI or AVH, feel patient will require psychiatric evaluation for further disposition.  Medical screening labs ordered, as well as EKG, patient does take lithium, took his last regular dose yesterday, lithium level ordered as well.  No focal medical complaints, no focal findings on exam.  Patient was initially tachycardic at 106, this resolved without intervention and on my exam pt with normal HR and vitals are otherwise stable.   Labs unremarkable, no leukocytosis, stable hemoglobin, no acute electrolyte derangements requiring intervention, glucose slightly elevated at 131, patient not fasting.  Negative ethanol, salicylate and acetaminophen levels.  UDS positive for cocaine and amphetamines as patient reported.  Lithium level pending.  EKG without concerning ischemic changes.  At this time patient is medically cleared for  psychiatric evaluation.  TTS consult placed and patient placed under ED psych hold.  Will defer to psychiatry on restarting patient's psychiatric medications.  Awaiting TTS recommendations for appropriate disposition.  Final Clinical Impressions(s) / ED Diagnoses   Final diagnoses:  Suicidal ideation  Polysubstance abuse [facility])    ED Discharge Orders    None       Janet Berlin 03/07/18 2142    Sherwood Gambler, MD 03/09/18 [phone]
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
## 2018-03-07 NOTE — ED Triage Notes (Signed)
Pt reports feeling suicidal. Pt reports attempting to hang himself 2 days ago. Pt reports using meth and cocaine, last use was approximately yesterday. Pt reports feeling depressed.

## 2018-03-08 ENCOUNTER — Encounter (HOSPITAL_COMMUNITY): Payer: Self-pay

## 2018-03-08 ENCOUNTER — Other Ambulatory Visit: Payer: Self-pay

## 2018-03-08 ENCOUNTER — Emergency Department (HOSPITAL_COMMUNITY)
Admission: EM | Admit: 2018-03-08 | Discharge: 2018-03-09 | Disposition: A | Discharge status: Home / Self Care | Payer: Medicaid Other | Source: Home / Self Care

## 2018-03-08 DIAGNOSIS — R440 Auditory hallucinations: Secondary | ICD-10-CM

## 2018-03-08 DIAGNOSIS — R45851 Suicidal ideations: Secondary | ICD-10-CM

## 2018-03-08 DIAGNOSIS — F250 Schizoaffective disorder, bipolar type: Secondary | ICD-10-CM | POA: Diagnosis present

## 2018-03-08 DIAGNOSIS — R45850 Homicidal ideations: Secondary | ICD-10-CM

## 2018-03-08 DIAGNOSIS — F1410 Cocaine abuse, uncomplicated: Secondary | ICD-10-CM

## 2018-03-08 HISTORY — DX: Other symptoms and signs involving emotional state: R45.89

## 2018-03-08 HISTORY — DX: Other symptoms and signs involving appearance and behavior: R46.89

## 2018-03-08 HISTORY — DX: Suicidal ideations: R45.851

## 2018-03-08 LAB — LITHIUM LEVEL: Lithium Lvl: <0.06 mmol/L — ABNORMAL LOW (ref 0.60–1.20)

## 2018-03-08 MED ORDER — HALOPERIDOL 1 MG PO TABS
5.0000 mg | ORAL_TABLET | Freq: Two times a day (BID) | ORAL | Status: DC
  Administered 2018-03-08: 5 mg via ORAL
  Filled 2018-03-08: qty 5

## 2018-03-08 MED ORDER — LITHIUM CARBONATE 300 MG PO CAPS: 600.0000 mg | ORAL_CAPSULE | Freq: Two times a day (BID) | ORAL | Status: DC

## 2018-03-08 NOTE — ED Triage Notes (Addendum)
Pt reports SI/HI. Voices telling him to harm self and others. Pt state him attempted to hang himself and overdose on drugs several days ago. Pt admits to being here earlier today. He states he was denied a bed because of his insurance. Pt discharged from [facility] at 3pm today. Seen by ACT at [facility] and did not meet criteria for inpatient admission.

## 2018-03-08 NOTE — ED Notes (Signed)
Patients ACT team member Justin Chaney at bedside speaking with patient. Mr Justin Chaney spoke with CSW regarding patients care and that patient did not meet inpatient criteria. Belongings returned to patient and medications previously stored in pharmacy picked up and returned to patient.

## 2018-03-08 NOTE — ED Notes (Addendum)
Pt is calm but suspicious of peopel, doesn't want lights or tv on, sits on the bed.  He doesn't interact with staff much at all. He did take his scheduled medications without difficulty.  No needs voiced.

## 2018-03-08 NOTE — [facility] Counselor (Signed)
Per Jinny Blossom, RN, patient will be observed and monitored overnight for safety and withdrawal potential and reassessed in the am

## 2018-03-08 NOTE — ED Notes (Signed)
Patient belongings have been inventoried and placed in locker 2 along with Medications counted and sent to [facility]

## 2018-03-08 NOTE — ED Notes (Signed)
Pt changed into scrubs, 2 bags belongings locked in locker # 30,security contacted to wand pt.

## 2018-03-08 NOTE — Progress Notes (Signed)
Disposition CSW contacted [facility] crisis line [phone]), identified as patient's ACTT team, and asked that they come and assess patient for outpatient needs and provide supportive services, including transportation.  Areatha Keas. Judi Cong, MSW, [place] Disposition Clinical Social Work [phone] (cell) [phone] (office)

## 2018-03-08 NOTE — ED Notes (Signed)
Pt. Belongings searched and inventory and placed in [place] [address].

## 2018-03-08 NOTE — Progress Notes (Addendum)
Patient is seen by me today via tele-psych and I have consulted with Dr. Parke Poisson.  While speaking with patient patient comes agitated with the more questions I asked him about his previous treatments, previous hospitalizations, and his medications.  Patient reports that he does not follow up with anybody for outpatient treatment but then states that he has stayed on his same medications, and when pushed more on asking how he is getting his medications prescribed he admits to being on [facility].  He is informed that we will need to contact them due to continued medical treatment he becomes agitated and finally states that they have not provided him with the housing that he wants and that he needs to have inpatient hospitalization for that.  Patient does not appear to be responding to any internal stimuli.  Patient is agitated about involving his current treatment team.  He then starts and that they do nothing for him and they have never helped him.  Patient then cusses and states that the GD word.  Patient then tells me that he does not want to talk to him or if we are going to talk to the ACT team. I reviewed patient's chart and patient has numerous ED and hospitalization visits in the past year.  Numerous ED visits where he was discharged due to seeking shelter.  Suspect patient is seeking secondary gain of shelter from inpatient unit and he has a long history of chronic reports of SI and substance abuse to gain this shelter to the inpatient unit.  CSW was contacted and [facility] will asked him to come and assess him, however at this time patient does not meet criteria for inpatient treatment and will be psychiatric cleared.  I have attempted to contact Dr. Coralyn Pear, but he is busy and I contacted the pot F charge nurse and notified her and advised her to that Dr. Octaviano Batty on though he may contact me.  Marland Kitchen.Agree with NP Progress Note

## 2018-03-08 NOTE — Progress Notes (Addendum)
Disposition CSW received call from patient's ACTT QP with [facility], Worthy Keeler, ([phone]) who will not be able to come and assess patient until later this afternoon as he has another patient who is in crisis and is not certain how long that will take.  Notified Valetta Fuller, RN in [facility] Psych ED.  Areatha Keas. Judi Cong, MSW, [place] Disposition Clinical Social Work [phone] (cell) [phone] (office)  Addendum: Called QP @ 1:25 PM and left a vm requesting ETA to [facility] ED.

## 2018-03-08 NOTE — BH Assessment (Signed)
Assessment Note  Justin Chaney is an 22 y.o. male. history of bipolar disorder, schizophrenia, substance abuse and homelessness, who presents to the emergency department for evaluation for worsening SI and command voices to harm himself and others. Patient reports hearing voices that are becoming more consistent telling him to harm himself and those around him, but denies voices telling him to harm a specific person. Patient was seen at [facility] last night and was discharged from there today after overnight observation, he was evaluated by behavioral health as well as his ACT Team, Mr. Angelica Chessman, at the the hospital, patient reports "they just could not help me". Patient stated "I had Medicaid so they couldn't give me a bed". Patient reported attempt to hang himself and overdose on drugs several days ago. Patient continued to repeat following when asked about plan to harm self and others, "whatever the voices tell me to do that's what I will do".  Per [facility] ED staff: Patient presentation at [facility] yesterday, with suicidal ideation and seeking help for his methamphetamine problem.  Patient states that he is having suicidal thoughts, but when asked what his plan was he stated "anything really."  When asked if he had prior suicide attempts, he stated, "I don't know."  When asked how many times he had been hospitalized for mental health issues, he stated, "I don't know."  However, he did indicate that he was last hospitalized in March because of suicidal thoughts, but could not name what facility he was in.  When patient asked if he was homicidal, he said "yes."  When asked if he wanted to hurt anyone specific, he stated, "anyone."   Patient states that he is hearing voices telling him to sacrifice himself  Disposition:  Disposition Initial Assessment Completed for this Encounter: Yes  Patriciaann Clan, NP, overnight observation for safety, reevaluate in the morning. Benedetto Goad, PA, and RN notified of  disposition.   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 Asthma   . Bipolar disorder ([place])   . 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 Depression   . Homelessness   . Schizophrenia ([place])   . Seizures ([place])    childhood seizures  . Suicidal behavior   . Suicidal intent     Past Surgical History:  Procedure Laterality Date  . APPENDECTOMY    . SHUNT REMOVAL    . TONSILLECTOMY    . VENTRICULO-PERITONEAL SHUNT PLACEMENT / LAPAROSCOPIC INSERTION PERITONEAL CATHETER      Family History:  Family History  Problem Relation Age of Onset  . Hypertension Mother   . Mental illness Neg Hx     Social History:  reports that he has been smoking cigars.  He has been smoking about 0.00 packs per day. He has never used smokeless tobacco. He reports that he does not drink alcohol or use drugs.  Additional Social History:  Alcohol / Drug Use Pain Medications: see MAR Prescriptions: see MAR Over the Counter: see MAR  CIWA: CIWA-Ar BP: 125/68 Pulse Rate: 77 COWS:    Allergies:  Allergies  Allergen Reactions  . Amoxicillin Hives and Other (See Comments)    CHILDHOOD ALLERGY Has patient had a PCN reaction causing immediate rash, facial/tongue/throat swelling, SOB or lightheadedness with hypotension: YES Has patient had a PCN reaction causing severe rash involving mucus membranes or skin necrosis: No  Has patient had a PCN reaction that required hospitalization No Has patient had a PCN reaction occurring within the last 10 years: No If all of the above answers are "NO", then may proceed with Cephalosporin use.  Marland Kitchen Penicillins Hives and Other (See Comments)    CHILDHOOD ALLERGY Has patient had a PCN reaction causing immediate rash, facial/tongue/throat swelling, SOB or lightheadedness with hypotension: No Has  patient had a PCN reaction causing severe rash involving mucus membranes or skin necrosis: No Has patient had a PCN reaction that required hospitalization No Has patient had a PCN reaction occurring within the last 10 years: No If all of the above answers are "NO", then may proceed with Cephalosporin use.    Home Medications:  (Not in a hospital admission)  OB/GYN Status:  No LMP for male patient.  General Assessment Data Location of Assessment: [facility] ED TTS Assessment: In system Is this a Tele or Face-to-Face Assessment?: Face-to-Face Is this an Initial Assessment or a Re-assessment for this encounter?: Initial Assessment Marital status: Single Living Arrangements: Other (Comment)(homeless) Can pt return to current living arrangement?: Yes Admission Status: Voluntary Is patient capable of signing voluntary admission?: Yes Referral Source: Self/Family/Friend Insurance type: (Medicaid)     Crisis Care Plan Living Arrangements: Other (Comment)(homeless) Legal Guardian: Other:(self) Name of Psychiatrist: none Name of Therapist: none  Education Status Is patient currently in school?: No Is the patient employed, unemployed or receiving disability?: Receiving disability income  Risk to self with the past 6 months Suicidal Ideation: Yes-Currently Present Has patient been a risk to self within the past 6 months prior to admission? : No Suicidal Intent: Yes-Currently Present Has patient had any suicidal intent within the past 6 months prior to admission? : No Is patient at risk for suicide?: Yes Suicidal Plan?: Yes-Currently Present("whatever they tell me to do") Has patient had any suicidal plan within the past 6 months prior to admission? : No Specify Current Suicidal Plan: ("whatever they tell me to do") Access to Means: (unknown) What has been your use of drugs/alcohol within the last 12 months?: (meth) Previous Attempts/Gestures: No How many times?: (0) Other Self Harm  Risks: homeless and drug problem Triggers for Past Attempts: Unknown Intentional Self Injurious Behavior: None Family Suicide History: Unable to assess Recent stressful life event(s): Financial Problems, Other (Comment)(homeless) Persecutory voices/beliefs?: No Depression: Yes Depression Symptoms: Insomnia, Isolating, Loss of interest in usual pleasures, Feeling worthless/self pity Substance abuse history and/or treatment for substance abuse?: Yes Suicide prevention information given to non-admitted patients: Not applicable  Risk to Others within the past 6 months Homicidal Ideation: Yes-Currently Present Does patient have any lifetime risk of violence toward others beyond the six months prior to admission? : No Thoughts of Harm to Others: Yes-Currently Present Comment - Thoughts of Harm to Others: ("whatever they tell me to do") Current Homicidal Intent: Yes-Currently Present Current Homicidal Plan: Yes-Currently Present("whatever they tell me to do") Describe Current Homicidal Plan: (unknown) Access to Homicidal Means: (unknown) Identified Victim: (unknown) History of harm to others?: No Assessment of Violence: None Noted Violent Behavior Description: (none) Does patient have access to weapons?: No Criminal Charges Pending?: No Does patient have a court date: No Is patient on probation?: No  Psychosis Hallucinations: Auditory(kill self and harm others) Delusions: None noted  Mental Status Report Appearance/Hygiene: Unremarkable, In scrubs Eye Contact: Poor Motor Activity: Freedom of movement Speech: Logical/coherent Level of Consciousness: Alert Mood: Depressed, Helpless, Irritable Affect: Flat Anxiety Level: Minimal Thought Processes: Coherent, Relevant Judgement: Impaired  Orientation: Person, Place, Time, Situation Obsessive Compulsive Thoughts/Behaviors: Moderate  Cognitive Functioning Concentration: Fair Memory: Recent Intact, Remote Intact Is patient IDD:  No Is patient DD?: No Insight: Poor Impulse Control: Poor Appetite: Poor Have you had any weight changes? : Loss Amount of the weight change? (lbs): (unknown) Sleep: Decreased Total Hours of Sleep: (none) Vegetative Symptoms: Not bathing, Decreased grooming  ADLScreening [facility] Assessment Services) Patient's cognitive ability adequate to safely complete daily activities?: Yes Patient able to express need for assistance with ADLs?: Yes Independently performs ADLs?: Yes (appropriate for developmental age)  Prior Inpatient Therapy Prior Inpatient Therapy: Yes Prior Therapy Dates: March 2019 Prior Therapy Facilty/Provider(s): multiple by Hx:  [facility], [facility], [facility], [facility], [facility] Reason for Treatment: depression  Prior Outpatient Therapy Prior Outpatient Therapy: No Does patient have an ACCT team?: Yes Does patient have Intensive In-House Services?  : No Does patient have [facility] services? : No Does patient have P4CC services?: No  ADL Screening (condition at time of admission) Patient's cognitive ability adequate to safely complete daily activities?: Yes Patient able to express need for assistance with ADLs?: Yes Independently performs ADLs?: Yes (appropriate for developmental age)             Regulatory affairs officer (For Healthcare) Does Patient Have a Medical Advance Directive?: No Would patient like information on creating a medical advance directive?: No - Patient declined    Additional Information 1:1 In Past 12 Months?: No CIRT Risk: No Elopement Risk: No Does patient have medical clearance?: Yes     Disposition:  Disposition Initial Assessment Completed for this Encounter: Yes  Patriciaann Clan, NP, overnight observation for safety, reevaluate in the morning. Benedetto Goad, PA, and RN notified of disposition.   On Site Evaluation by: Kirtland Bouchard, [place] with Physician:  Patriciaann Clan, NP  Venora Maples 03/08/2018 11:30 PM

## 2018-03-08 NOTE — ED Notes (Signed)
Pt ambulatory w/o difficulty from triage

## 2018-03-08 NOTE — ED Provider Notes (Signed)
[facility] DEPT Provider Note   CSN: [id number] Arrival date & time: 03/08/18  1824     History   Chief Complaint Chief Complaint  Patient presents with  . Suicidal  . Homicidal  . Medical Clearance    HPI Justin Chaney is a 22 y.o. male.  Justin Chaney is a 22 y.o. Male with history of bipolar disorder, schizophrenia, substance abuse and homelessness, who presents to the emergency department for evaluation of persistent worsening suicidal ideation, reports attempt to hang himself and overdose on drugs several days ago.  Patient now is also endorsing HI and AVH.  He reports he has voices that are becoming more consistent telling him to harm himself and those around him, but denies voices telling him to harm a specific person.  Patient reports that he was seen at [facility] last night and was discharged from there today after overnight observation, he was evaluated by behavioral health as well as his act team, patient reports "they just could not help me".  He reports he thinks he was declined because of his insurance.  Patient has not used any additional drugs since leaving the hospital this afternoon.  Denies any new medical complaints, denies chest pain, shortness of breath, abdominal pain, nausea or vomiting.  No headaches, vision changes, pain weakness or tingling in extremities.      Past Medical History:  Diagnosis Date  . Asthma   . Bipolar disorder ([place])   . 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 Depression   . Homelessness   . Schizophrenia ([place])   . Seizures ([place])    childhood seizures  . Suicidal behavior   . Suicidal intent     Patient Active Problem List   Diagnosis Date Noted  . Cocaine use disorder, severe, in early remission ([place]) 12/21/2016  . Amphetamine abuse in remission ([place]) 12/21/2016  . ADHD (attention deficit hyperactivity disorder) 12/21/2016  . 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 Tobacco use  disorder 12/21/2016  . Tobacco use 10/06/2016  . Homicidal ideations 09/16/2016  . Suicidal ideation   . Hallucinations   . Homicidal ideation   . Suicidal thoughts   . Cannabis use disorder, moderate, in sustained remission ([place]) 11/07/2015  . Depression 10/29/2015  . Intermittent explosive disorder 09/05/2015  . Schizoaffective disorder, bipolar type ([place]) 09/04/2015  . Malingering 06/05/2015  . Antisocial personality disorder ([place]) 06/03/2015  . Cocaine abuse ([place]) 06/03/2015    Past Surgical History:  Procedure Laterality Date  . APPENDECTOMY    . SHUNT REMOVAL    . TONSILLECTOMY    . VENTRICULO-PERITONEAL SHUNT PLACEMENT / LAPAROSCOPIC INSERTION PERITONEAL CATHETER          Home Medications    Prior to Admission medications   Medication Sig Start Date End Date Taking? Authorizing Provider  acetaminophen (TYLENOL) 325 MG tablet Take 2 tablets (650 mg total) by mouth every 6 (six) hours as needed. Patient not taking: Reported on 03/07/2018 01/03/18   Langston Masker B, PA-C  ARIPiprazole (ABILIFY) 10 MG tablet Take 1 tablet (10 mg total) by mouth at bedtime. For depression Patient not taking: Reported on 12/11/2017 12/30/16   Hughie Closs A, NP  ARIPiprazole (ABILIFY) 5 MG tablet Take 5 mg by mouth daily.  11/29/17   [provider]  hydrOXYzine (ATARAX/VISTARIL) 25 MG tablet Take 1 tablet (25 mg total) by mouth 3 (three) times daily as needed for anxiety. Patient not taking: Reported on 12/11/2017 12/30/16  Hughie Closs A, NP  lithium carbonate (LITHOBID) 300 MG CR tablet Take 300 mg by mouth daily.  10/26/17   [provider]  lithium carbonate 600 MG capsule Take 1 capsule (600 mgs) in the morning and at supper: For mood stabilization Patient not taking: Reported on 12/11/2017 12/30/16   Hughie Closs A, NP  nicotine (NICODERM CQ - DOSED IN MG/24 HOURS) 21 mg/24hr patch Place 1 patch (21 mg total) onto the skin daily. For smoking ceasation Patient not  taking: Reported on 12/11/2017 12/31/16   Hughie Closs A, NP  traZODone (DESYREL) 50 MG tablet Take 1 tablet (50 mg total) by mouth at bedtime as needed for sleep. Patient not taking: Reported on 12/11/2017 12/30/16   Vicenta Aly, NP    Family History Family History  Problem Relation Age of Onset  . Hypertension Mother   . Mental illness Neg Hx     Social History Social History   Tobacco Use  . Smoking status: Current Every Day Smoker    Packs/day: 0.00    Types: Cigars  . Smokeless tobacco: Never Used  Substance Use Topics  . Alcohol use: No  . Drug use: No    Types: Other-see comments, Amphetamines    Comment: History of use     Allergies   Amoxicillin and Penicillins   Review of Systems Review of Systems  Constitutional: Negative for chills and fever.  HENT: Negative for congestion, rhinorrhea and sore throat.   Eyes: Negative for visual disturbance.  Respiratory: Negative for cough and shortness of breath.   Cardiovascular: Negative for chest pain.  Gastrointestinal: Negative for abdominal pain, nausea and vomiting.  Genitourinary: Negative for dysuria and frequency.  Musculoskeletal: Negative for arthralgias and myalgias.  Skin: Negative for color change and rash.  Neurological: Negative for dizziness, syncope, speech difficulty and numbness.  Psychiatric/Behavioral: Positive for dysphoric mood, hallucinations, self-injury and suicidal ideas.     Physical Exam Updated Vital Signs BP 125/68 (BP Location: Left Arm)   Pulse 77   Temp 97.6 F (36.4 C) (Oral)   Resp 18   Ht 5\' 3"  (1.6 m)   SpO2 100%   BMI 38.97 kg/m   Physical Exam  Constitutional: He is oriented to person, place, and time. He appears well-developed and well-nourished. No distress.  HENT:  Head: Normocephalic and atraumatic.  Mouth/Throat: Oropharynx is clear and moist.  Eyes: Pupils are equal, round, and reactive to light. EOM are normal. Right eye exhibits no discharge. Left  eye exhibits no discharge.  Neck: Neck supple.  Cardiovascular: Normal rate, regular rhythm, normal heart sounds and intact distal pulses.  Pulmonary/Chest: Effort normal and breath sounds normal. No respiratory distress.  Respirations equal and unlabored, patient able to speak in full sentences, lungs clear to auscultation bilaterally  Abdominal: Soft. Bowel sounds are normal. He exhibits no distension and no mass. There is no tenderness. There is no guarding.  Abdomen soft, nondistended, nontender to palpation in all quadrants without guarding or peritoneal signs  Musculoskeletal: He exhibits no edema or deformity.  Neurological: He is alert and oriented to person, place, and time. Coordination normal.  Skin: Skin is warm and dry. He is not diaphoretic.  Psychiatric: His affect is labile. He is withdrawn. He is not actively hallucinating. He expresses homicidal and suicidal ideation. He expresses suicidal plans. He expresses no homicidal plans. He is noncommunicative.  Nursing note and vitals reviewed.    ED Treatments / Results  Labs (all labs ordered are listed,  but only abnormal results are displayed) Labs Reviewed - No data to display  EKG None  Radiology No results found.  Procedures Procedures (including critical care time)  Medications Ordered in ED Medications - No data to display   Initial Impression / Assessment and Plan / ED Course  I have reviewed the triage vital signs and the nursing notes.  Pertinent labs & imaging results that were available during my care of the patient were reviewed by me and considered in my medical decision making (see chart for details).  Pt presents with persisting suicidal ideations, also complaining of during voices telling him to harm himself and others.  Patient was seen at [facility] yesterday and behavioral health felt that patient did not meet inpatient criteria he returns with worsening SI.  Patient has no new focal medical complaints  and normal vitals.  Do not feel that repeating medical screening labs would be valuable at this time.  Will consult TTS for evaluation, patient is medically cleared at this time.  Awaiting TTS recommendations for appropriate disposition.  Final Clinical Impressions(s) / ED Diagnoses   Final diagnoses:  Suicidal ideation  Homicidal ideation  Auditory hallucination    ED Discharge Orders    None       Janet Berlin 03/08/18 2123    Fredia Sorrow, MD 03/11/18 [id number]

## 2018-03-08 NOTE — ED Notes (Signed)
Patient refused to sign Medical clearance rules/regulations for PODF-Monique,RN

## 2018-03-08 NOTE — ED Notes (Addendum)
Patient ambulatory to restroom and waiting for breakfast tray.

## 2018-03-09 NOTE — BH Assessment (Signed)
[facility] Assessment Progress Note  Per Buford Dresser, DO, this pt does not require psychiatric hospitalization at this time.  Pt is to be discharged from [facility] with recommendation to continue treatment with the [facility] ACT Team.  He is to be discharged by 14:00.  This has been included in pt's discharge instructions.  Pt's ACT Team contact, Worthy Keeler ([phone]), has been notified.  Pt would also benefit from seeing Peer Support Specialists; they will be asked to speak to pt.  Pt's nurse, Diane, has been notified.  Jalene Mullet, [place] Triage Specialist [phone]

## 2018-03-09 NOTE — ED Notes (Signed)
Pt discharged home. Discharged instructions read to pt who verbalized understanding. All belongings returned to pt who signed for same. Denies SI/HI, is not delusional and not responding to internal stimuli. Escorted pt to the ED exit.    Pt was very irritable at being discharged.

## 2018-03-09 NOTE — BHH Suicide Risk Assessment (Signed)
Suicide Risk Assessment  Discharge Assessment   [facility] Discharge Suicide Risk Assessment   Principal Problem: Schizoaffective disorder, bipolar type [facility]) Discharge Diagnoses:  Patient Active Problem List   Diagnosis Date Noted  . Homicidal ideation [R45.850]     Priority: High  . Schizoaffective disorder, bipolar type ([place]) [F25.0] 09/04/2015    Priority: High  . Cocaine use disorder, severe, in early remission ([place]) [F14.21] 12/21/2016  . Amphetamine abuse in remission ([place]) [F15.11] 12/21/2016  . ADHD (attention deficit hyperactivity disorder) [F90.9] 12/21/2016  . 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 Tobacco use disorder [F17.200] 12/21/2016  . Tobacco use [Z72.0] 10/06/2016  . Homicidal ideations [R45.850] 09/16/2016  . Suicidal ideation [R45.851]   . Hallucinations [R44.3]   . Suicidal thoughts [R45.851]   . Cannabis use disorder, moderate, in sustained remission ([place]) [F12.21] 11/07/2015  . Depression [F32.9] 10/29/2015  . Intermittent explosive disorder [F63.81] 09/05/2015  . Malingering [Z76.5] 06/05/2015  . Antisocial personality disorder ([place]) [F60.2] 06/03/2015  . Cocaine abuse ([place]) [F14.10] 06/03/2015   Pt was discharged from [facility] this morning and came to [facility] after being discharged. Pt's UDS on 03-07-18 was positive for cocaine and amphetamines, BAL negative. Pt stated he didn't know what we needed to talk about when asked why he was here.Pt would not disclose much information and kept his head covered with his blanket. Pt is avoidant of answering questions due to his agenda is to try and go inpatient. Pt will be given outpatient resources and speak with peer support for community resources for help with substance abuse. Pt is psychiatrically clear for discharge.   Total Time spent with patient: 30 minutes  Musculoskeletal: Strength & Muscle Tone: within normal limits Gait & Station: not assessed Patient leans: N/A  Psychiatric Specialty Exam:   Blood pressure  107/70, pulse 75, temperature 98.5 F (36.9 C), temperature source Oral, resp. rate 16, height 5\' 3"  (1.6 m), SpO2 96 %.Body mass index is 38.97 kg/m.  General Appearance: Casual  Eye Contact::  Fair  Speech:  [phone]  Volume:  Decreased  Mood:  Depressed and Irritable  Affect:  Congruent and Depressed  Thought Process:  Coherent, Goal Directed, Linear and Descriptions of Associations: Intact  Orientation:  Full (Time, Place, and Person)  Thought Content:  Logical  Suicidal Thoughts:  No  Homicidal Thoughts:  No  Memory:  Immediate;   Good Recent;   Good Remote;   Fair  Judgement:  Fair  Insight:  Lacking  Psychomotor Activity:  Decreased  Concentration:  Fair  Recall:  [employer] of Knowledge:Good  Language: Good  Akathisia:  No  Handed:  Right  AIMS (if indicated):     Assets:  Communication Skills Social Support  Sleep:     Cognition: WNL  ADL's:  Intact   Mental Status Per Nursing Assessment::   On Admission:   Depressed  Demographic Factors:  Male, Adolescent or young adult, Low socioeconomic status and Unemployed  Loss Factors: Financial problems/change in socioeconomic status  Historical Factors: Impulsivity  Risk Reduction Factors:   Sense of responsibility to family  Continued Clinical Symptoms:  Depression:   Impulsivity Alcohol/Substance Abuse/Dependencies  Cognitive Features That Contribute To Risk:  Closed-mindedness    Suicide Risk:  Minimal: No identifiable suicidal ideation.  Patients presenting with no risk factors but with morbid ruminations; may be classified as minimal risk based on the severity of the depressive symptoms   Plan Of Care/Follow-up recommendations:  Activity:  as tolerated Diet:  Heart Healthy  Ethelene Hal, NP 03/09/2018, 1:34 PM

## 2018-03-09 NOTE — Discharge Instructions (Addendum)
For your behavioral health needs, you are advised to continue treatment with the [facility]: ° °     [facility] °     [facility], [address] °     [address] °     [place], [place]  [address] °     [phone] °     Crisis number: [phone] °

## 2018-03-09 NOTE — BH Assessment (Signed)
Patriciaann Clan, NP, overnight observation for safety, reevaluate in the morning. Benedetto Goad, PA, and RN notified of disposition.

## 2018-04-13 ENCOUNTER — Ambulatory Visit (HOSPITAL_COMMUNITY)
Admission: RE | Admit: 2018-04-13 | Discharge: 2018-04-13 | Disposition: A | Discharge status: Home / Self Care | Payer: Medicaid Other | Attending: Psychiatry | Admitting: Psychiatry

## 2018-04-13 DIAGNOSIS — F1910 Other psychoactive substance abuse, uncomplicated: Secondary | ICD-10-CM | POA: Insufficient documentation

## 2018-04-13 DIAGNOSIS — F1410 Cocaine abuse, uncomplicated: Secondary | ICD-10-CM | POA: Diagnosis not present

## 2018-04-13 DIAGNOSIS — F329 Major depressive disorder, single episode, unspecified: Secondary | ICD-10-CM | POA: Insufficient documentation
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
## 2018-04-13 NOTE — H&P (Signed)
Behavioral Health Medical Screening Exam  Justin Chaney is an 22 y.o. male patient presents as walk in at [facility]; with complaints of depressing and requesting assistance with detox and rehab placement.  Patient denies suicidal ideation; but states that he has had passive suicidal thoughts; no plan, no intent.  Patient also denies homicidal ideation, psychosis, and paranoia.   Discussed the [employer] at [facility] and patient states that he is interested but does not have transportation.  Resources information given to patient and bus tickets that would take patient one block form [facility].    Total Time spent with patient: 45 minutes  Psychiatric Specialty Exam: Physical Exam  Vitals reviewed. Constitutional: He is oriented to person, place, and time. He appears well-developed and well-nourished.  HENT:  Head: Normocephalic.  Neck: Normal range of motion.  Respiratory: Effort normal.  Musculoskeletal: Normal range of motion.  Neurological: He is alert and oriented to person, place, and time.  Skin: Skin is warm and dry.  Psychiatric: His speech is normal. He is not actively hallucinating. Thought content is not paranoid and not delusional. He exhibits a depressed mood (Stable). He expresses no homicidal ideation. Suicidal: Passive thoughts. He expresses no suicidal plans.    Review of Systems  Psychiatric/Behavioral: Positive for depression and substance abuse (Cocaine, meth, and THC). Hallucinations: Denies. Suicidal ideas: Reports passive suicidal thoughts; but does not want to kill himself. Nervous/anxious: Denies. Insomnia: denies.   All other systems reviewed and are negative.   Blood pressure 114/80, pulse 68, temperature 97.9 F (36.6 C), resp. rate 16, SpO2 100 %.There is no height or weight on file to calculate BMI.  General Appearance: Casual  Eye Contact:  Good  Speech:  Clear and Coherent and Normal Rate  Volume:  Normal  Mood:  Appropriate   Affect:  Appropriate and Congruent  Thought Process:  Coherent and Goal Directed  Orientation:  Full (Time, Place, and Person)  Thought Content:  WDL and Logical  Suicidal Thoughts:  No  Homicidal Thoughts:  No  Memory:  Immediate;   Good Recent;   Good Remote;   Good  Judgement:  Intact  Insight:  Present  Psychomotor Activity:  Normal  Concentration: Concentration: Good and Attention Span: Good  Recall:  Good  Fund of Knowledge:Good  Language: Good  Akathisia:  No  Handed:  Right  AIMS (if indicated):     Assets:  Communication Skills Desire for Improvement Physical Health Resilience  Sleep:       Musculoskeletal: Strength & Muscle Tone: within normal limits Gait & Station: normal Patient leans: N/A  Blood pressure 114/80, pulse 68, temperature 97.9 F (36.6 C), resp. rate 16, SpO2 100 %.  Recommendations:  Outpatient psychiatric resources.  Referral to [facility] for [employer].    Based on my evaluation the patient does not appear to have an emergency medical condition.  Channel Papandrea, NP 04/13/2018, 6:23 PM

## 2018-05-06 ENCOUNTER — Inpatient Hospital Stay
Admission: EM | Admit: 2018-05-06 | Discharge: 2018-05-08 | DRG: 885 | Disposition: A | Discharge status: Home / Self Care | Payer: Medicaid - Out of State | Attending: Psychiatry | Admitting: Psychiatry

## 2018-05-06 ENCOUNTER — Encounter (HOSPITAL_COMMUNITY): Payer: Self-pay

## 2018-05-06 DIAGNOSIS — R45851 Suicidal ideations: Secondary | ICD-10-CM | POA: Diagnosis present

## 2018-05-06 DIAGNOSIS — Z880 Allergy status to penicillin: Secondary | ICD-10-CM

## 2018-05-06 DIAGNOSIS — R000 Tachycardia, unspecified: Secondary | ICD-10-CM | POA: Diagnosis present

## 2018-05-06 DIAGNOSIS — E876 Hypokalemia: Secondary | ICD-10-CM | POA: Diagnosis present

## 2018-05-06 DIAGNOSIS — F1590 Other stimulant use, unspecified, uncomplicated: Secondary | ICD-10-CM | POA: Diagnosis present

## 2018-05-06 DIAGNOSIS — Z91410 Personal history of adult physical and sexual abuse: Secondary | ICD-10-CM

## 2018-05-06 DIAGNOSIS — F1190 Opioid use, unspecified, uncomplicated: Secondary | ICD-10-CM | POA: Diagnosis present

## 2018-05-06 DIAGNOSIS — Z590 Homelessness: Secondary | ICD-10-CM

## 2018-05-06 DIAGNOSIS — F1410 Cocaine abuse, uncomplicated: Secondary | ICD-10-CM | POA: Diagnosis present

## 2018-05-06 DIAGNOSIS — F39 Unspecified mood [affective] disorder: Secondary | ICD-10-CM | POA: Diagnosis present

## 2018-05-06 DIAGNOSIS — Z79899 Other long term (current) drug therapy: Secondary | ICD-10-CM

## 2018-05-06 DIAGNOSIS — F1221 Cannabis dependence, in remission: Secondary | ICD-10-CM | POA: Diagnosis present

## 2018-05-06 DIAGNOSIS — Z91411 Personal history of adult psychological abuse: Secondary | ICD-10-CM

## 2018-05-06 DIAGNOSIS — F1110 Opioid abuse, uncomplicated: Secondary | ICD-10-CM | POA: Diagnosis present

## 2018-05-06 DIAGNOSIS — F29 Unspecified psychosis not due to a substance or known physiological condition: Principal | ICD-10-CM | POA: Diagnosis present

## 2018-05-06 DIAGNOSIS — F602 Antisocial personality disorder: Secondary | ICD-10-CM | POA: Diagnosis present

## 2018-05-06 DIAGNOSIS — H109 Unspecified conjunctivitis: Secondary | ICD-10-CM | POA: Diagnosis present

## 2018-05-06 DIAGNOSIS — F3181 Bipolar II disorder: Secondary | ICD-10-CM | POA: Diagnosis present

## 2018-05-06 DIAGNOSIS — F17210 Nicotine dependence, cigarettes, uncomplicated: Secondary | ICD-10-CM | POA: Diagnosis present

## 2018-05-06 DIAGNOSIS — F17200 Nicotine dependence, unspecified, uncomplicated: Secondary | ICD-10-CM | POA: Diagnosis present

## 2018-05-06 DIAGNOSIS — F1510 Other stimulant abuse, uncomplicated: Secondary | ICD-10-CM | POA: Diagnosis present

## 2018-05-06 DIAGNOSIS — Z915 Personal history of self-harm: Secondary | ICD-10-CM

## 2018-05-06 DIAGNOSIS — R7401 Elevation of levels of liver transaminase levels: Secondary | ICD-10-CM | POA: Diagnosis present

## 2018-05-06 DIAGNOSIS — E669 Obesity, unspecified: Secondary | ICD-10-CM | POA: Diagnosis present

## 2018-05-06 DIAGNOSIS — F259 Schizoaffective disorder, unspecified: Secondary | ICD-10-CM | POA: Diagnosis present

## 2018-05-06 LAB — URINALYSIS WITH CULTURE REFLEX, WHEN INDICATED
Bilirubin: NEGATIVE
Blood: NEGATIVE
Glucose: NEGATIVE
Leuk Esterase: NEGATIVE
Nitrite: NEGATIVE
Specific Gravity: 1.032 — ABNORMAL HIGH (ref 1.002–1.030)
pH: 5 (ref 5.0–8.0)

## 2018-05-06 LAB — CBC WITH DIFF, BLOOD
ANC-Automated: 8 10*3/uL — ABNORMAL HIGH (ref 1.6–7.0)
Abs Basophils: 0 10*3/uL (ref <0.1)
Abs Eosinophils: 0.1 10*3/uL (ref 0.1–0.5)
Abs Lymphs: 2.3 10*3/uL (ref 0.8–3.1)
Abs Monos: 1 10*3/uL — ABNORMAL HIGH (ref 0.2–0.8)
Basophils: 0 %
Eosinophils: 1 %
Hct: 42 % (ref 40.0–50.0)
Hgb: 14.2 gm/dL (ref 13.7–17.5)
Lymphocytes: 20 %
MCH: 28.9 pg (ref 26.0–32.0)
MCHC: 33.8 g/dL (ref 32.0–36.0)
MCV: 85.4 um3 (ref 79.0–95.0)
MPV: 9.9 fL (ref 9.4–12.4)
Monocytes: 9 %
Plt Count: 253 10*3/uL (ref 140–370)
RBC: 4.92 10*6/uL (ref 4.60–6.10)
RDW: 12.9 % (ref 12.0–14.0)
Segs: 70 %
WBC: 11.4 10*3/uL — ABNORMAL HIGH (ref 4.0–10.0)

## 2018-05-06 LAB — BASIC METABOLIC PANEL, BLOOD
Anion Gap: 14 mmol/L (ref 7–15)
BUN: 16 mg/dL (ref 6–20)
Bicarbonate: 23 mmol/L (ref 22–29)
Calcium: 9.6 mg/dL (ref 8.5–10.6)
Chloride: 101 mmol/L (ref 98–107)
Creatinine: 0.92 mg/dL (ref 0.67–1.17)
GFR: >60 mL/min
Glucose: 124 mg/dL — ABNORMAL HIGH (ref 70–99)
Potassium: 3.3 mmol/L — ABNORMAL LOW (ref 3.5–5.1)
Sodium: 138 mmol/L (ref 136–145)

## 2018-05-06 LAB — UR DRUGS OF ABUSE SCREEN
Barbiturates Screen: NEGATIVE
Benzodiazepine Screen: NEGATIVE
Methadone Screen: NEGATIVE
Oxycodone Screen: NEGATIVE
Phencyclidine Screen: NEGATIVE
THC Screen: NEGATIVE

## 2018-05-06 LAB — LITHIUM, BLOOD: Lithium: <0.1 mmol/L — ABNORMAL LOW (ref 0.6–1.2)

## 2018-05-06 LAB — ACETAMINOPHEN, BLOOD: Acetaminophen: <5 ug/mL — ABNORMAL LOW (ref 10–30)

## 2018-05-06 MED ORDER — ACETAMINOPHEN 325 MG PO TABS
650.0000 mg | ORAL_TABLET | ORAL | Status: DC | PRN
Start: 2018-05-06 — End: 2018-05-08

## 2018-05-06 MED ORDER — LITHIUM CARBONATE 300 MG OR TBCR
300.0000 mg | EXTENDED_RELEASE_TABLET | Freq: Two times a day (BID) | ORAL | Status: DC
Start: 2018-05-06 — End: 2018-05-08
  Administered 2018-05-06 – 2018-05-08 (×5): 300 mg via ORAL
  Filled 2018-05-06 (×4): qty 1

## 2018-05-06 MED ORDER — ALUM & MAG HYDROXIDE-SIMETH 200-200-20 MG/5ML OR SUSP
30.0000 mL | Freq: Four times a day (QID) | ORAL | Status: DC | PRN
Start: 2018-05-06 — End: 2018-05-08

## 2018-05-06 MED ORDER — OLANZAPINE ODT 5 MG OR TBDP
5.0000 mg | ORAL_TABLET | Freq: Every day | ORAL | Status: DC | PRN
Start: 2018-05-06 — End: 2018-05-08

## 2018-05-06 MED ORDER — POTASSIUM CHLORIDE CRYS CR 10 MEQ OR TBCR
20.0000 meq | EXTENDED_RELEASE_TABLET | Freq: Once | ORAL | Status: AC
Start: 2018-05-06 — End: 2018-05-06
  Administered 2018-05-06: 20 meq via ORAL
  Filled 2018-05-06: qty 2

## 2018-05-06 MED ORDER — ARIPIPRAZOLE 10 MG OR TBDP
10.0000 mg | ORAL_TABLET | Freq: Every day | ORAL | Status: DC
Start: 2018-05-06 — End: 2018-05-08
  Administered 2018-05-06 – 2018-05-08 (×3): 10 mg via ORAL
  Filled 2018-05-06 (×3): qty 1

## 2018-05-06 MED ORDER — MAGNESIUM HYDROXIDE 400 MG/5ML OR SUSP
30.0000 mL | Freq: Two times a day (BID) | ORAL | Status: DC | PRN
Start: 2018-05-06 — End: 2018-05-08

## 2018-05-06 MED ORDER — HYDROXYZINE HCL 25 MG OR TABS
25.0000 mg | ORAL_TABLET | ORAL | Status: DC | PRN
Start: 2018-05-06 — End: 2018-05-08

## 2018-05-06 NOTE — ED EKG Interpretation (Signed)
ED EKG Interpretation    EKG:   Sinus tach, rate of 111. Inferolateral twi. Unclear accuity.

## 2018-05-06 NOTE — ED Notes (Signed)
05/06/2018 1:37 PM Rush Farmer    Two EKG copies handed to/signed by Dr. Annamaria Helling.   Signed copy placed in EKG folder. EKG transmitted.

## 2018-05-06 NOTE — ED MD Progress Note (Signed)
Workup Review as of May 06 1558   Others' Documentation   Sat May 06, 2018   1529 Psych recommendations: abilify 10mg  now, reassessment by psych upon metab & social work. Will update oncoming team with recommendations      [TC]   1435 Sinus tachycardia   ECG 12 Lead [TC]   1435 Cbc wnl, slight leukocytosis   CBC w/ Diff Lavender(!) [TC]   1435 Electrolytes wnl   Basic Metabolic Panel, Blood Green Plasma Separator Tube(!) [TC]   1434 Negative tylenol level    Acetaminophen(!): <5 [TC]   1434 Negative Li level   Lithium(!): <0.1 [TC]      Workup Review User Index  [TC] Sadie Haber, MD      Signout from - Dr. Cases    History - Drew Rocha is a 22 year old male w/ schizo, on lithium and abilify, od on meth. OD workup, including lithium.     Vitals:  Vitals:    05/06/18 1347 05/06/18 1358 05/06/18 1400 05/06/18 1403   BP:  115/55 (!) 104/47 113/60   BP Location:  Right arm Right arm Right arm   BP Patient Position:  Supine Sitting Standing   Pulse:       Resp:       Temp:       SpO2: 96% 96% 94% 94%   Height:           Lab Results:  Labs Reviewed   CBC WITH DIFF, BLOOD - Abnormal; Notable for the following components:       Result Value    WBC 11.4 (*)     ANC-Automated 8.0 (*)     Abs Monos 1.0 (*)     All other components within normal limits   BASIC METABOLIC PANEL, BLOOD - Abnormal; Notable for the following components:    Glucose 124 (*)     Potassium 3.3 (*)     All other components within normal limits   HIV 1/2 ANTIBODY & P24 ANTIGEN ASSAY, BLOOD   URINALYSIS WITH CULTURE REFLEX, WHEN INDICATED   ACETAMINOPHEN, BLOOD   LITHIUM, BLOOD   URINE IMMUNOASSAY DRUG SCREEN       Imaging Results:  No orders to display       Dispo -   Tox workup  Psych consult  Level 2      Kris Hartmann, [place]  Emergency Medicine    4:00 PM  Results for orders placed or performed during the hospital encounter of 05/06/18   CBC w/ Diff Lavender   Result Value Ref Range    WBC 11.4 (H) 4.0 - 10.0 1000/mm3    RBC 4.92 4.60 - 6.10  mill/mm3    Hgb 14.2 13.7 - 17.5 gm/dL    Hct 09.8 11.9 - 14.7 %    MCV 85.4 79.0 - 95.0 um3    MCH 28.9 26.0 - 32.0 pgm    MCHC 33.8 32.0 - 36.0 g/dL    RDW 82.9 56.2 - 13.0 %    MPV 9.9 9.4 - 12.4 fL    Plt Count 253 140 - 370 1000/mm3    Segs 70 %    Lymphocytes 20 %    Monocytes 9 %    Eosinophils 1 %    Basophils 0 %    ANC-Automated 8.0 (H) 1.6 - 7.0 1000/mm3    Abs Lymphs 2.3 0.8 - 3.1 1000/mm3    Abs Monos 1.0 (H) 0.2 - 0.8 1000/mm3  Abs Eosinophils 0.1 <0.1 - 0.5 1000/mm3    Abs Basophils 0.0 <0.1 1000/mm3    Diff Type Automated    Basic Metabolic Panel, Blood Green Plasma Separator Tube   Result Value Ref Range    Glucose 124 (H) 70 - 99 mg/dL    BUN 16 6 - 20 mg/dL    Creatinine 8.65 7.84 - 1.17 mg/dL    GFR >69 mL/min    Sodium 138 136 - 145 mmol/L    Potassium 3.3 (L) 3.5 - 5.1 mmol/L    Chloride 101 98 - 107 mmol/L    Bicarbonate 23 22 - 29 mmol/L    Anion Gap 14 7 - 15 mmol/L    Calcium 9.6 8.5 - 10.6 mg/dL   Acetaminophen, Blood - See Instructions   Result Value Ref Range    Acetaminophen <5 (L) 10 - 30 mcg/mL   Lithium - See Instructions   Result Value Ref Range    Lithium <0.1 (L) 0.6 - 1.2 mmol/L       4:52 PM  abilify 10mg  per psych  Likely admit, sw working on [facility] for [place] admission  Request fro repeat vitals (initially tachy)
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
## 2018-05-06 NOTE — ED Provider Notes (Signed)
Emergency Department Note  [facility] electronic medical record reviewed for pertinent medical history.     Patient: Drew Rocha, MRN [id number], DOB 1996/07/17  The Date of Service for the Emergency Room encounter is 05/06/2018 12:54 PM     Nursing Triage Note :   Chief Complaint   Patient presents with   . Mental Health Problem     BIBM SI attempt, 2-3x more heroin and meth than usual, reports was si attetmp, hx schizo, compliant w psych meds. vss for medics. fsg 119.        HPI :   Belvin Gauss is a 22 year old male with PMHx as below notable for schizophrenia, bipolar presents with SI.  Patient states that he just came from [place] and has been feeling suicidal.  He tried "overdosing with meth and heroin".  States he takes Abilify and lithium which he has been taking as prescribed.    Initial or first presentation of SI:             no  Past suicide attempts:   no  Psychiatric inpatient admissions:  yes  Current psychiatric medications:  Abilify and lithium, compliant  Plan:      Overdose  Illicit drug use:    occasional  EtOH intoxication currently:   denies  EtOH use:     denies  Head trauma or history of TBI:  no, denies fall.   Access to weapons:    no    No other medical complaints at this time. Denies F/ N/ V/ D/ diaphoresis/ chills/ CP/ palpitations/ SOB/ HA/ neck pain or stiffness/ photophobia/ blurry or double vision/ recent unexplained weight loss/ numbness/ tingling/ weakness/ gait or balance issues    Past Medical History : No past medical history on file.    Past Surgical history : No past surgical history on file.    Family History: No family history on file.    Social History:  Social History     Tobacco Use   . Smoking status: Not on file   Substance Use Topics   . Alcohol use: Not on file   . Drug use: Not on file     Tobacco: Denies  Alcohol: See HPI  Illicit drugs: See HPI  Housed    Medications:   None       Allergies: Amoxicillin and Penicillins    Review of Systems:  Complete review  of 12 systems reviewed and negative unless otherwise noted in the HPI or above. This was done per my custom and practice for systems appropriate to the chief complaint in an emergency department setting and varies depending on the quality of history that the patient is able to provide. History reviewed today as available from records and EPIC chart.      Physical Exam:   05/06/18  1347 05/06/18  1358 05/06/18  1400 05/06/18  1403   BP:  115/55 (!) 104/47 113/60   Pulse:       Resp:       Temp:       SpO2: 96% 96% 94% 94%       Nursing note and vitals reviewed. Pt not hypoxic.  Gen: lying in bed comfortably, NAD  HEENT: NCAT, EOMI, MMM  Neck: able to range neck without distress, no posterior midline tenderness  CV: RRR, no M  Pulm: CTA b/l, no wheezes  Abd: +bs, soft, NTND  Back: no midline tenderness, no CVA tenderness  MSK: 5/5 b/l finger grasp,  ankle flexion/extension  Neuro: A&Ox3, no gait ataxia, sensation in tact to light touch in b/l upper/lower extremity  Skin: no rashes/ecchymoses visualized  Ext: 2+ radial/tp pulses, moves all extremities, no LE edema  Psych: +SI, with  plan, -HI, -AH, - VH    ED Course & Clinical Decision Making:  22 year old male with PMHx as listed in HPI presents with SI with plan to overdose on pills    Vitals: wnl/ stable/ afebrile  Pertinent Exam Findings: unremarkable, no focal findings    Medicallycleared  Medically cleared.  Will get BAC/UTox and Psych consult. Patient placed on level 2 and suicide precautions/sitter. Initial workup to include:   - BAC/UTox   - Psych consult    Given this does not represent the initial presentation of SI symptoms and patient with prior history of SI and psychiatric pathology, no further indication for neuroimaging or broader lab-work (such as TSH, basic labs). Will continue to reassess patient and adjust workup and treatment based on psych recommendations. Disposition pending psych.       Orders Placed This Encounter   Procedures   . CBC w/ Diff  Lavender   . HIV 1/2 Antibody & P24 Antigen Assay Yellow serum separator tube   . Urinalysis with Culture Reflex, when indicated   . Basic Metabolic Panel, Blood Green Plasma Separator Tube   . Acetaminophen, Blood - See Instructions   . Lithium - See Instructions   . Urine Immunoassay Drug Screen   . IP Consult to Psychiatry   . ECG 12 Lead     Medications - No data to display    Diagnostic Imaging Studies  No results found.    Workup Review as of May 07 1531   [facility] Documentation   ZOX May 06, 2018   1529 Psych recommendations: abilify 10mg  now, reassessment by psych upon metab & social work. Will update oncoming team with recommendations        1435 Sinus tachycardia   ECG 12 Lead   1435 Cbc wnl, slight leukocytosis   CBC w/ Diff Lavender(!)   1435 Electrolytes wnl   Basic Metabolic Panel, Blood Green Plasma Separator Tube(!)   1434 Negative tylenol level    Acetaminophen(!): <5   1434 Negative Li level   Lithium(!): <0.1       I have discussed my thought process and plan for the patient with the attending physician, Jolaine Click, Bevelyn Buckles, MD.    Sadie Haber, MD  Emergency Medicine PGY-1         Sadie Haber, MD  Resident  05/06/18 1535       Ernestine Mcmurray, MD  05/06/18 [phone]

## 2018-05-06 NOTE — ED Notes (Signed)
1 belonging bag placed in outside psych locker

## 2018-05-06 NOTE — ED Notes (Cosign Needed)
Spoke to [place] at extension [phone]. She said Casimiro Needle, RN will be receiving this pt, but they are not ready for the pt yet. Alba Cory said Casimiro Needle will call.
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
## 2018-05-06 NOTE — Consults (Signed)
Psychiatry Consult Note    Date of Admission: 05/06/2018  Consulting Attending: Ernestine Mcmurray, MD  Reason for Consult: SRA     History of Present Illness:     Drew Rocha is a 22 year old male with self-reported hx of schizoaffective  and bipolar disorders on abilify and lithium, who just came off a greyhound bus from [place] to start a new life. Endorses SI and CAH, which led him to attempt to "OD on meth and heroin" about 3 hours prior to self-presenting. Has never taken heroin before and does not say where he got the drugs from. UTox +amphetamines, cocaine, opiates..    Pt to be admitted to [facility]. Please see H&P for more detail.    Margreta Journey PGY1

## 2018-05-06 NOTE — ED Floor Report (Signed)
ED to Psychiatry Floor Report    Drew Rocha is a 22 year old male    Triage Chief Complaint:    Chief Complaint   Patient presents with   . Mental Health Problem     BIBM SI attempt, 2-3x more heroin and meth than usual, reports was si attetmp, hx schizo, compliant w psych meds. vss for medics. fsg 119.        Admitted for: SI, HX of schizophrenia (CMS-HCC)    Active Psychiatric Symptoms: Pt is agitated a little because he thought he might be discharged and states " I came here to get help, I don't want to be DC". Other than that, pt hs been calm, resting in bed. 1 to 1 sitter has been utilized.     PIV Removed:  Yes  (Reminder: [facility] and [facility] do not accept patients with IVs)    Legal Status: level 2    Restrained in the ED: No    Attempt to Elope or AWOL during ED visit:  No    Housing Situation: shelter    Can Patient return to current housing when discharged?  N/A     ADL: independent.    Last Set of Vitals:  BP 113/60 (BP Location: Right arm, BP Patient Position: Standing)   Pulse (!) 124   Temp 99.3 F (37.4 C)   Resp 20   Ht 5\' 3"  (1.6 m)   SpO2 94%     BAL Result:  No results found for: ETOH       Urine Tox Result:    Lab Results   Component Value Date    BARBCLASS Negative 05/06/2018    BENZYLCGNN Pending confirm 05/06/2018    BENZDIAZCL Negative 05/06/2018    METHADONE Negative 05/06/2018    OPIATESCL Pending confirm 05/06/2018    OXY Negative 05/06/2018    PHENCYCLDN Negative 05/06/2018    TETCANNABIN Negative 05/06/2018    UDI See Comment 05/06/2018       PMH:  No past medical history on file.    Medications Given:    Medications   ARIPiprazole (ABILIFY DISCMELT) disintegrating tablet 10 mg (10 mg Oral Given by Other 05/06/18 1710)           Call Lockie Pares A Trai Ells at [phone] for questions.

## 2018-05-06 NOTE — Interdisciplinary (Signed)
05/06/18 1706   Assessment   Assessment Type Initial   Referral Information   Referral Type Discharge Planning     SW asked by psychiatry team to inquire about a crisis bed for patient.  Presented to ED requesting admission to a psychiatric floor secondary to IV heroin and IV meth use stating he took as an attempt to OD and kill himself.  2 prior history of suicide attempts by hanging and cutting his throat.  New to [facility] and got off a bus from [place] 2 days ago to "start a new life".    SW called all the crisis houses and most do not have beds available until Monday.    [facility] a bed tomorrow morning and [facility] has a bed late this evening.  Spoke with Ollen Gross after meeting with the patient and gave her clinical information and the questions I knew she would ask.  She felt the patient was too high risk and acute for a crisis house given recent SI attempt and patient stating he is still feeling suicidal.  She was also concerned about two serious attempts within the past year.  She denied patient and felt patient needed an inpatient behavioral health stay.      This information ws relayed to psychiatric resident.  He asked that I call [facility] to see if they have beds available as patient does not have insurance.  SW called [facility] and spoke with charge RN who stated they are full and do not have beds available and to call back tomorrow.    Nathaniel Man, LCSW  Clinical Social Worker

## 2018-05-06 NOTE — Interdisciplinary (Signed)
Admitted 22 year old  [place] male from ED on voluntary legal status.  Presented with primary target symptoms of Command Auditory hallucinations to kill self, Meth, Heroin, IV drug use with attempt to overdose, past history of suicide attempt, has been treated with lithium, and Abilify for mood and auditory hallucinations recently relocated form [place] to start a new life  .  Placed on every 10 minute level of observation.  Refer to PADB for further details.

## 2018-05-07 DIAGNOSIS — F17200 Nicotine dependence, unspecified, uncomplicated: Secondary | ICD-10-CM

## 2018-05-07 DIAGNOSIS — R45851 Suicidal ideations: Secondary | ICD-10-CM | POA: Diagnosis present

## 2018-05-07 DIAGNOSIS — F39 Unspecified mood [affective] disorder: Secondary | ICD-10-CM | POA: Diagnosis present

## 2018-05-07 DIAGNOSIS — F1190 Opioid use, unspecified, uncomplicated: Secondary | ICD-10-CM | POA: Diagnosis present

## 2018-05-07 DIAGNOSIS — F1221 Cannabis dependence, in remission: Secondary | ICD-10-CM

## 2018-05-07 DIAGNOSIS — R7401 Elevation of levels of liver transaminase levels: Secondary | ICD-10-CM | POA: Diagnosis present

## 2018-05-07 DIAGNOSIS — F1590 Other stimulant use, unspecified, uncomplicated: Secondary | ICD-10-CM | POA: Diagnosis present

## 2018-05-07 DIAGNOSIS — R740 Nonspecific elevation of levels of transaminase and lactic acid dehydrogenase [LDH]: Secondary | ICD-10-CM

## 2018-05-07 LAB — LIVER PANEL, BLOOD
ALT (SGPT): 60 U/L — ABNORMAL HIGH (ref 0–41)
AST (SGOT): 110 U/L — ABNORMAL HIGH (ref 0–40)
Albumin: 3.7 g/dL (ref 3.5–5.2)
Alkaline Phos: 96 U/L (ref 40–129)
Bilirubin, Dir: <0.2 mg/dL (ref <0.2)
Bilirubin, Tot: 0.4 mg/dL (ref <1.2)
Total Protein: 7 g/dL (ref 6.0–8.0)

## 2018-05-07 LAB — ECG 12-LEAD
ATRIAL RATE: 111 {beats}/min
P AXIS: 48 degrees
PR INTERVAL: 120 ms
QRS INTERVAL/DURATION: 86 ms
QT: 344 ms
QTC INTERVAL: 467 ms
R AXIS: 31 degrees
T AXIS: 59 degrees
VENTRICULAR RATE: 111 {beats}/min

## 2018-05-07 LAB — BASIC METABOLIC PANEL, BLOOD
Anion Gap: 13 mmol/L (ref 7–15)
BUN: 10 mg/dL (ref 6–20)
Bicarbonate: 24 mmol/L (ref 22–29)
Calcium: 9.2 mg/dL (ref 8.5–10.6)
Chloride: 100 mmol/L (ref 98–107)
Creatinine: 0.83 mg/dL (ref 0.67–1.17)
GFR: >60 mL/min
Glucose: 117 mg/dL — ABNORMAL HIGH (ref 70–99)
Potassium: 4.5 mmol/L (ref 3.5–5.1)
Sodium: 137 mmol/L (ref 136–145)

## 2018-05-07 LAB — LIPID(CHOL FRACT) PANEL, BLOOD
Cholesterol: 133 mg/dL (ref <200)
HDL-Cholesterol: 38 mg/dL
LDL-Chol (Calc): 72 mg/dL (ref <160)
Non-HDL Cholesterol: 95 mg/dL
Triglycerides: 113 mg/dL (ref 10–170)

## 2018-05-07 LAB — HIV-ANTIBODY RAPID TEST, BLOOD: HIV 1/2 Rapid Antibody Test: NONREACTIVE

## 2018-05-07 LAB — TSH, BLOOD: TSH: 0.89 u[IU]/mL (ref 0.27–4.20)

## 2018-05-07 LAB — HIV 1/2 ANTIBODY & P24 ANTIGEN ASSAY, BLOOD: HIV 1/2 Antibody & P24 Antigen Assay: NONREACTIVE

## 2018-05-07 LAB — GLYCOSYLATED HGB(A1C), BLOOD: Glyco Hgb (A1C): 4.8 % (ref 4.8–5.8)

## 2018-05-07 LAB — PREALBUMIN (TRANSTHYRETIN), BLOOD: Prealbumin: 21 mg/dL (ref 20–40)

## 2018-05-07 MED ORDER — NICOTINE POLACRILEX 2 MG MT LOZG
2.0000 mg | LOZENGE | OROMUCOSAL | Status: DC | PRN
Start: 2018-05-07 — End: 2018-05-08

## 2018-05-07 NOTE — Plan of Care (Signed)
Problem: Promotion of Mental Health and Safety  Goal: The pt remains safe, receives appropriate treatment and achieves outcomes (psychologically, psychosocially, physically, and spiritually) within the limitations of the disease process by discharge.  Outcome: Progressing  Flowsheets (Taken 05/07/2018 0600)  Guidelines: Inpatient Nursing Guidelines; Thought disorder; Schizophrenia  Individualized Interventions/Recommendations #1: Promote sleep hygiene.  Note:   Slept 7.5 hours through the night. NAD.      Problem: Promotion of Mental Health and Safety  Goal: The pt remains safe, receives appropriate treatment and achieves outcomes (psychologically, psychosocially, physically, and spiritually) within the limitations of the disease process by discharge.  Outcome: Progressing  Flowsheets (Taken 05/07/2018 0600)  Guidelines: Inpatient Nursing Guidelines; Thought disorder; Schizophrenia  Individualized Interventions/Recommendations #1: Promote sleep hygiene.  Note:   Slept 7.5 hours through the night. NAD.

## 2018-05-07 NOTE — Plan of Care (Signed)
Problem: Promotion of Mental Health and Safety  Goal: The pt remains safe, receives appropriate treatment and achieves outcomes (psychologically, psychosocially, physically, and spiritually) within the limitations of the disease process by discharge.  Outcome: Not Progressing  Flowsheets  Taken 05/07/2018 1614  Patient /Family stated Goal: utilize positive coping skills  Taken 05/07/2018 1631  Guidelines: Inpatient Nursing Guidelines;Mood disorder;Thought disorder  Outcome Evaluation (rationale for progressing/not progressing) every shift: see notes below  Individualized Interventions/Recommendations #1: encourage compliance with medications to target sx  Individualized Interventions/Recommendations #2: provide structure, encourage attendance and participation with groups  Individualized Interventions/Recommendations #3: encourage to utilize positive coping skills  Note:   Patient was in the dayroom making phone calls at the beginning of the shift. Upon approached his initial statement was, " can I have a single bed room, I cant sleep with these people." Labile, entitled, anxious, irritable  denies depressed, si, hi and hallucinations but states, " I was before coming here, I was hearing voices telling me to end my life, but I feel safe here." Ate well, " I am, extremely satisfied with my dinner tonight, I have 2 burgers." Declined to join groups and retired to bed early. Compliant with medications, observe patient safety q10 min.

## 2018-05-07 NOTE — Plan of Care (Signed)
Problem: Promotion of Mental Health and Safety  Goal: The pt remains safe, receives appropriate treatment and achieves outcomes (psychologically, psychosocially, physically, and spiritually) within the limitations of the disease process by discharge.  Outcome: Progressing  Flowsheets  Taken 05/07/2018 0800 by Garald Braver August, RN  Patient /Family stated Goal: "to get better"  Taken 05/07/2018 0600 by Harlon Ditty, RN  Guidelines: Inpatient Nursing Guidelines;Thought disorder;Schizophrenia  Taken 05/07/2018 1610 by Garald Braver August, RN  Outcome Evaluation (rationale for progressing/not progessing) every shift: Pt is friendly, pleasant, cooperative. Pt is seen eating and grooming adequately. On first interaction, pt is quick to verbalize needs, requesting more food at breakfast and requesting clothes from dryer. Pt reports SI with no plan or intent, feels safe on unit. When asking patietn about events leading to admission, pt says "i came here on a grey hound. I felt like I wanted to kill myself least fastest way possible. I dont want to live anymore" Pt reports AH prior to admission, pt denies it this morning. Pt says while smiling, "I think the extraterrestial aliens were messing with me." Pt does not seem to be internally preoccupied. Pt is seems to be entitled. Pt was encouraged to save plastic pitcher, while pt was throwing away, nurse asked for patient to save pitcher. pt threw it away anyway and says "I will get another one."   Individualized Interventions/Recommendations #2: provide 1:1 interaction  Individualized Interventions/Recommendations #3: encourage expression thoughts and feelings  Individualized Interventions/Recommendations #4: provide structured environment     Problem: Promotion of Mental Health and Safety  Goal: The pt remains safe, receives appropriate treatment and achieves outcomes (psychologically, psychosocially, physically, and spiritually) within the limitations of the disease  process by discharge.  Outcome: Progressing  Flowsheets  Taken 05/07/2018 0800 by Garald Braver August, RN  Patient /Family stated Goal: "to get better"  Taken 05/07/2018 0600 by Harlon Ditty, RN  Guidelines: Inpatient Nursing Guidelines;Thought disorder;Schizophrenia  Taken 05/07/2018 9604 by Garald Braver August, RN  Outcome Evaluation (rationale for progressing/not progessing) every shift: Pt is friendly, pleasant, cooperative. Pt is seen eating and grooming adequately. On first interaction, pt is quick to verbalize needs, requesting more food at breakfast and requesting clothes from dryer. Pt reports SI with no plan or intent, feels safe on unit. When asking patietn about events leading to admission, pt says "i came here on a grey hound. I felt like I wanted to kill myself least fastest way possible. I dont want to live anymore" Pt reports AH prior to admission, pt denies it this morning. Pt says while smiling, "I think the extraterrestial aliens were messing with me." Pt does not seem to be internally preoccupied.  Individualized Interventions/Recommendations #2: provide 1:1 interaction  Individualized Interventions/Recommendations #3: encourage expression thoughts and feelings  Individualized Interventions/Recommendations #4: provide structured environment

## 2018-05-08 ENCOUNTER — Other Ambulatory Visit: Payer: Self-pay

## 2018-05-08 DIAGNOSIS — F29 Unspecified psychosis not due to a substance or known physiological condition: Principal | ICD-10-CM

## 2018-05-08 LAB — CONFIRM AMPHETAMINES-URINE
Conf. Amphetamine: POSITIVE ng/mL — AB
Conf. MDA: NEGATIVE ng/mL
Conf. MDMA: NEGATIVE ng/mL
Conf. Methamphetamine: POSITIVE ng/mL — AB

## 2018-05-08 LAB — SYPHILIS EIA SCREEN, BLOOD: Syphilis Screen: NEGATIVE

## 2018-05-08 MED ORDER — NICOTINE 14 MG/24HR TD PT24
1.0000 | MEDICATED_PATCH | Freq: Every day | TRANSDERMAL | 0 refills | Status: AC
Start: 2018-05-08 — End: ?

## 2018-05-08 MED ORDER — LITHIUM CARBONATE 300 MG OR TBCR
300.0000 mg | EXTENDED_RELEASE_TABLET | Freq: Two times a day (BID) | ORAL | 0 refills | Status: AC
Start: 2018-05-08 — End: ?

## 2018-05-08 MED ORDER — ARIPIPRAZOLE 10 MG OR TBDP
10.0000 mg | ORAL_TABLET | Freq: Every day | ORAL | 0 refills | Status: AC
Start: 2018-05-09 — End: ?

## 2018-05-08 MED ORDER — INFLUENZA VAC SPLIT QUAD 0.5 ML IM SUSY
0.5000 mL | PREFILLED_SYRINGE | INTRAMUSCULAR | Status: DC
Start: 2018-05-08 — End: 2018-05-08

## 2018-05-08 NOTE — Plan of Care (Signed)
Psychiatry Multidisciplinary Treatment Plan         MR#: [id number]  DENVIL, CANNING  CSN: [id number]  DOB: 08-31-1995 M       Admission Date: 05/06/2018      Legal Status: Voluntary           PATIENT STRENGTHS:  Patient Strengths: Seeking treatment voluntarily;Intact cognition;Average intelligence;Good physical health    IDENTIFIED PROBLEMS:   Active Hospital Problems    Diagnosis   . 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 Unspecified mood (affective) disorder (CMS-HCC) [F39]   . 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 Opioid use [F11.90]   . Tobacco use disorder [F17.200]   . Cannabis use disorder, moderate, in early remission (CMS-HCC) [F12.21]   . Suicidal ideation [R45.851]   . Transaminitis [R74.0]      Resolved Hospital Problems   No resolved problems to display.     ANTICIPATED DISCHARGE PLAN: Unknown, to be determined    Discharge Criteria:  - Patient will experience no hallucinations, verbal or auditory, that trigger problem behaviors requiring medications for 24 consecutive hours.  - Patient will have no verbalizations of suicide ideations for 24 consecutive hours.  - Appropriate aftercare plans will be established based on the patient's level of care needs, cognitive strengths and weaknesses, geographical location, and financial criteria.  - Patient will establish a stable medication regimen; no problem behaviors requiring medications for 24 consecutive hours.    Patient-Stated Goal: "to feel safe"  ELOS: 2-4 days         _________________________         ____/____/___      _________  Patient Signature                                      Date                    Time    _________________________  Printed Name    Patient refused to sign:   Yes       No    _________________________         ____/____/___      _________  Witness          Date                    Time

## 2018-05-08 NOTE — Interdisciplinary (Addendum)
BH Social Work Psychosocial Assessment:    Drew Rocha, 22 year old male.   Admitted on 05/06/2018  Psychosocial Assessment Interview on 05/08/2018    Documentation:    Legal Status: 72 Hour Hold --DTS exp 10/8 @ 1300. Firearms Prohibition Report filed to [employer]. Paper copy placed in Consents portion of patient's record.     Is Patient Minor?: No       Guardian: None    Presenting Problems: Patient was BIBP after reported OD on medication and heroin, in the context of CAH and utox positive for amphetamines, cocaine, and opiates.     Problem List:   Patient Active Problem List   Diagnosis   . Psychosis, unspecified psychosis type (CMS-HCC)   . Unspecified mood (affective) disorder (CMS-HCC)   . Stimulant use disorder   . Opioid use   . Tobacco use disorder   . Cannabis use disorder, moderate, in early remission (CMS-HCC)   . Suicidal ideation   . Transaminitis       Current Living Situation: Homeless;Other/Comment(shelter)-- said he has been staying at [facility] since he arrived in [place] a few days prior to admission.     Community Services/Support Systems/Professional Contacts:  Shelter    Current Insurance: Unfunded - patient reported he thought he had Medi-caid from [place], however admissions attempted to verify and found no eligibility.     Current Income: Disability    Education: unknown    Language:  English    Ethnicity:  African American    Patient Strengths:  History of indepedent functionality resources; Average intelligence      Substance Abuse History:    Substance Used:  Opiates:  heroin, moprhine, percodan, vicodin; Amphetamines: crystal, cocaine, ecstasy     Last Use: prior to admission       Assessment:  Patient is a 22 year old male with a self-reported history of schizoaffective disorder and bipolar disorder, who was BIBA after he reported SA by OD on heroin and medication, in the context of CAH and reported medication compliance, with utox positive for methamphetamines, cocaine,  and opiates. Patient reported recently moving from [facility] to [facility] for a "fresh start" and has been staying at a local shelter for a couple of days. He has been denying SI since he was admitted, and has been noted to be smiling and in no acute distress. Patient was cleared for discharge today. He was provided with community resources for emergency food, clothing, and shelter, sobriety resources, and outpatient psychiatric resources. Also given information on [facility] through [facility] and [facility].        Plan:  Discharge today.      Discharge Planning:    Outpatient Follow-up Care: Yes(No current providers but will be given info prior to dc)     [facility]  Your initial visit will be on a walk-in basis.   Walk-in hours are from 8:00 AM to 10:00 PM every day, including weekends.  [address] [place] [address]  Tel: [phone], FAX: [phone].      Patient Expects Discharge to:: Return to shelter with outpatient psychiatric follow up resources.

## 2018-05-08 NOTE — Progress Notes (Incomplete)
PSYCHIATRY PROGRESS NOTE    ID: Drew Rocha is a 22 year old male who recently arrived here by bus from [facility] with a self reported hx of schizoaffective and bipolar d/o as well as two previous suicide attempts, on Abilify and Lithium, BIBP on 5150 for DTS after attempting to "OD on meth and heroin" in the context of SI and CAH as well as long term polysubstance abuse. Utox +amphetamines, cocaine, and opiates.     Target Symptoms: SI, CAH    Subjective:  Yesterday, pt continued to endorse SI w/out a plan. Currently denies AH, but did state "I think the extraterrestrial aliens were messing with me." No PRN meds were given, he ate all of his meals, and he slept 7.25 hours overnight.    Today,    Family, Psychiatric, or Social History Updates: ***    Scheduled Medications:  . ARIPiprazole  10 mg Daily   . lithium  300 mg BID       PRN Medications:  . acetaminophen  650 mg Q4H PRN     . aluminum-magnesium-simethicone  30 mL Q6H PRN     . hydrOXYzine HCL  25 mg Q4H PRN     . magnesium hydroxide  30 mL Q12H PRN     . nicotine polacrilex  2 mg Q1H PRN     . OLANZapine  5 mg Daily PRN         Review of Systems: ***    Objective:   05/06/18  1746 05/06/18  1800 05/06/18  1906 05/07/18  0609   BP: 108/76 116/86 128/86 122/73   Pulse: 98 96 98 92   Resp: 18 16 16 18    Temp: 99 F (37.2 C) 98.8 F (37.1 C) 98.8 F (37.1 C) 98.1 F (36.7 C)   SpO2: 96% 98%       Pain: Pain Score: 0  Night Shift Hours of Sleep: 7.25 hrs.  Administered PRN meds in the last 24 hours (or last weekend): Dose: ***  Time: ***    New Labs/Studies: ***    Mental Status Examination:    Appearance: ***  Behavior: ***  Motor / abnormal involuntary movements: ***  Gait: ***  Speech: ***  Mood: ***  Affect: ***  Thought process: {Coherent / Incoherent:15656}, {Logical / Illogical:15657}  Associations: {Associations:15658}  Thought content: ***  Perceptions: {Perceptions:15659}  Insight / judgment: ***  Sensorium / orientation / intellectual functions:  ***    [place] Functional Assessment Scale:  Most Recent Total Score: 23  Last 24 Hours Total Score  Avg: 23.7  Min: 23  Max: 24    Narrative Assessment:   ***    Active Problem List:  ***    Plan:  A.  Psychiatric: ***  B.  Medical:  ***  C.  Psychosocial: ***  D.  Legal: ***  E.  Disposition: ***    This patient and his care were discussed with the attending psychiatrist, Dr. Marland Kitchen

## 2018-05-08 NOTE — Plan of Care (Addendum)
Problem: Promotion of Mental Health and Safety  Goal: The pt remains safe, receives appropriate treatment and achieves outcomes (psychologically, psychosocially, physically, and spiritually) within the limitations of the disease process by discharge.  Outcome: Progressing  Flowsheets (Taken 05/08/2018 0553)  Guidelines: Inpatient Nursing Guidelines; Thought disorder; Psychosis  Individualized Interventions/Recommendations #1: Promote sleep hygiene  Note:   Slept 7.25 hours through the night. NAD.   0645: Refused am labs     Problem: Promotion of Mental Health and Safety  Goal: The pt remains safe, receives appropriate treatment and achieves outcomes (psychologically, psychosocially, physically, and spiritually) within the limitations of the disease process by discharge.  Outcome: Progressing  Flowsheets (Taken 05/08/2018 0553)  Guidelines: Inpatient Nursing Guidelines; Thought disorder; Psychosis  Individualized Interventions/Recommendations #1: Promote sleep hygiene  Note:   Slept 7.25 hours through the night. NAD.

## 2018-05-08 NOTE — Plan of Care (Signed)
Problem: Promotion of Mental Health and Safety  Goal: The pt remains safe, receives appropriate treatment and achieves outcomes (psychologically, psychosocially, physically, and spiritually) within the limitations of the disease process by discharge.  Outcome: Discharged  Flowsheets (Taken 05/08/2018 0553 by Harlon Ditty, RN)  Guidelines: Inpatient Nursing Guidelines;Thought disorder;Psychosis  Note:   Patient cleared for discharge this afternoon. All belongings returned to patient. In good understanding of all discharge and follow up care recommendations. Provided with [employer] Provided with scripts to fill meds at pharmacy of choice. Patient provided with bus tokens for transportation.     Problem: Promotion of Mental Health and Safety  Goal: The pt remains safe, receives appropriate treatment and achieves outcomes (psychologically, psychosocially, physically, and spiritually) within the limitations of the disease process by discharge.  Outcome: Discharged  Flowsheets (Taken 05/08/2018 0553 by Harlon Ditty, RN)  Guidelines: Inpatient Nursing Guidelines;Thought disorder;Psychosis  Note:   Patient cleared for discharge this afternoon. All belongings returned to patient. In good understanding of all discharge and follow up care recommendations. Provided with [employer] Provided with scripts to fill meds at pharmacy of choice. Patient provided with bus tokens for transportation.

## 2018-05-08 NOTE — Discharge Instructions (Signed)
Diagnosis and Reason for Admission    You were admitted to the hospital for the following reason(s): You had thoughts to harm yourself.    Your full diagnosis list is located on this After Visit Summary in the Hospital Problems section.    What Happened During Your Hospital Stay    The main tests and treatments done for you during this hospitalization were:    Inpatient psychiatric hospitalization and treatment    You have the following medical test/study results pending at discharge: None    The following evaluation is still important to complete after discharge from the hospital:  Please follow up with your primary care provider or outpatient psychiatrist to have your lithium level tested by blood test on 05/10/18 between 8-10 AM.     Instructions for After Discharge    Your diet at home should be a regular diet.    Your activity level at home should be:  regular activity.    Specific activity restrictions:    None    Other instructions:  None    Your medication list is located on this After Visit Summary in the Current Discharge Medication List section.  Your nurse will review this information with you before you leave the hospital.    It is very important for you to keep a current medication list with you in order to assist your doctors with your medical care.  Bring this After Visit Summary with you to your follow up appointments.    Reasons to Contact a Doctor Urgently    Call 911 or go to the nearest hospital Emergency Department immediately if:  You have thoughts about hurting yourself or other people.    You should contact your outpatient therapist or psychiatrist for any of the following reasons:   Worsening mental health symptoms    If you have any questions about your hospital care, your pending test results,your medications, or if you have new or concerning symptoms soon after going home from the hospital, and you need to contact your hospital psychiatrist, your hospital psychiatrist can be contacted at  the [facility] ([facility]) at [phone].    Once you are able to see outpatient psychiatrist, he or she will then be responsible for further medication refills, or appointment referrals.    What Needs to Happen Next After Discharge -- Appointments and Follow Up    Any appointments already scheduled at  clinics will be listed in the Scheduled Appointments section at the top of this After Visit Summary.  Any appointments that have been requested, but have not yet been scheduled, will be listed below that under Post Discharge Referral.    Additional Information for You from your Case Manager and/or Social Worker (if applicable)  Next level of care referral: Patient referred to next level of care: [facility]    Substance abuse referral: Your next level of care provider will manage your addictions treatment      You will be discharging today to your own recognizance, and were provided a list of emergency resources for food, clothing, shelter, and mental health outpatient treatment providers by your social worker.     Your outpatient psychiatric follow up:  [facility]  Your initial visit will be on a walk-in basis.   Walk-in hours are from 8:00 AM to 10:00 PM every day, including weekends.  [address] [place] [address]  Tel: [phone], FAX: [phone].  AVS was faxed to this provider for coordination of care on 05/08/18 at 1200.    You were given a list of sobriety resources for 12 step support groups, rehab, and outpatient treatment services in [facility], and were encouraged by your social worker to utilize these resources to maintain sobriety.     Please utilize the [facility] [employer], [phone], 24 hours/7 days per week for psychiatric emergencies and referrals.    Medical Home Information  Your primary care provider or clinic currently on file at [place] [place] is: No Pcp, Per Patient  You currently have an advance  directive or living will on file at [facility]: Yes    If we are assigning you a new medical home for your follow up after discharge, that information will appear here:       Specific timing of follow-up:  Within 7 days    Additional Discharge Information (if applicable)            Handouts Given to You (if applicable)

## 2018-05-08 NOTE — Discharge Summary (Signed)
Psychiatry Service Discharge Summary    Date of Admission:  05/06/2018  Date of Discharge:  05/08/2018    Hospital Problem List (required):  Active Hospital Problems    Diagnosis   . 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 Unspecified mood (affective) disorder (CMS-HCC) [F39]   . 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 Opioid use [F11.90]   . Tobacco use disorder [F17.200]   . Cannabis use disorder, moderate, in early remission (CMS-HCC) [F12.21]   . Transaminitis [R74.0]      Resolved Hospital Problems    Diagnosis   . Suicidal ideation [R45.851]     Additional Hospital Diagnoses ("rule out" or "suspected" diagnoses, etc.):  R/O antisocial personality disorder, by history    Principal Procedure During This Hospitalization (required):  Inpatient psychiatric hospitalization and treatment    Other Procedures Performed During This Hospitalization (required):  Lab tests    Procedure results are available in Chart Review in Epic.  For those providers external to [place], the key procedure results are listed below:     05/07/2018 11:00   Glycated Hemoglobin 4.8   Triglycerides 113   Cholesterol 133   HDL-Cholesterol 38   Non-HDL Cholesterol 95   LDL-Chol (Calc) 72   TSH 0.89      05/07/2018 11:00   Creatinine 0.83   GFR >60     Consultations Obtained During This Hospitalization:  None    Key consultant recommendations:  N/A    Reason for Admission to the Hospital:  Drew Rocha is a22 year oldmale withself-reportedhx ofschizoaffectiveand bipolar disorders on Abilify and lithium who was BIBP on 5150 for DTS after reported suicide attempt with heroin and meth, in the context of recently moving from [place], medication non-adherence, and utox +amph, cocaine, and opiates.    Patient was brought in by medics after reported suicide attempt with heroin and meth, though the suicidal intent was unclear as the patient was medically stable without significant evidence of a toxidrome upon arrival to the ED.  On admission interview, he endorsed auditory hallucinations telling him to "hurt myself in evil ways", having a "mental breakdown," and visual hallucinations of "demons." He endorsed chronic suicidal ideation over the last year, denied having a plan, and reported two previous suicide attempts by cutting his throat and hanging himself.     Patient presented with suicidal ideation and command auditory hallucinations in the context of unclear suicide attempt on meth and heroin. He was at elevated acute risk of suicide given concerning history of prior suicide attempts, psychosocial stressors as patient is new to the area, and intoxication of the meth and heroin that he used. He was admitted to the inpatient psychiatric unit for medication management and stabilization.     Please see H&P dated 05/07/18 for more details.    Hospital Course by Problem:    A. Psychiatric:   # Psychosis, unspecified  Patient presented with auditory hallucinations and visual hallucinations. It's remains unclear if the patient has a primary psychotic disorder or if his symptoms are due to long-term polysubstance use. He was restarted on his outpatient dose of Abilify 10 mg po daily and observed on the unit as he metabolized the substances that he attempted to overdose on prior to his presentation. On day of discharge, he was linear, coherent, logical, and goal-directed in his mental status exam, and denied auditory and visual hallucinations. He was discharged with Abilify 10 mg po daily.     # Mood disorder, unspecified  Patient presented with chronic suicidal ideation and psychosocial stressors from recent move to [facility]. His suicidal ideation improved with metabolization on the unit and restarting his outpatient dose of lithium 300 mg po twice daily. He was discharged with this dose and was advised to obtain a lithium level morning of 05/10/18 by following up with an outpatient psychiatrist or primary care provider. On day of discharge, he  denied suicidal ideation, intent, and plan. He was appropriately future-oriented to transferring his SSI from [place] to [facility] as this is a higher cost of living area. This patient is likely at chronically elevated risk of suicide given his psychiatric diagnoses, history of attempts, and psychosocial stressors; however, he is at low acute risk of suicide at this time. His primary modifiable risk factors include his psychiatric symptoms, which we have addressed by restarting his medications and providing him with options for outpatient follow up [facility]), and his lack of housing resources, which we have addressed by providing him with a list of crisis housing that he can contact.     # Stimulant use disorder  # H/O cannabis use disorder  # Opiate use, R/O disorder  Patient was counseled with motivational interviewing during his stay. He appeared to be pre-contemplative. He was provided with outpatient resources should he consider cessation.     #Tobacco use disorder: Patient was offered nicotine replacement in the form of 14 mg patch and lozenges during this hospitalization. Patient was provided with education about cessation and resources for helping do so, including Quit Line referral. Nursing staff worked with patient to identify situations that trigger cravings and identify coping skills to deal with these triggers.  Patient was provided with nicotine replacement prescription at discharge.    B. Medical:     # Hypokalemia: patient with K+ decreased to 3.3 on admission, was repleted with oral potassium, and hypokalemia resolved on repeat BMP.    # Conjunctivitis: patient with some eye redness and discharge on exam, and endorsed itchiness, but otherwise asymptomatic without fever. He was monitored during his admission and remained relatively asymptomatic. Patient was advised to follow up with his primary care physician upon discharge.     C. Legal: voluntary    Comprehensive Risk Assessments  at Discharge:  A comprehensive suicide risk assessment was performed and the patient was assessed to be at a low acute risk of suicide. Modifiable risk factors include precipitating stressors and intoxication.  Intoxication had resolved by the time of discharge.  Additional stressors addressed as above.  Non-modifiable risk factors include previous suicide attempts, existing psychiatric diagnoses and male gender. The patient also has protective factors of future life plans and access to health care.    A violence risk assessment was also performed. The following behavioral risk factors are associated with acute violence risk and have been present within the past 24 hours: irritability. Based on these factors, this patient's acute risk of violence in the inpatient setting in the next 24 hours is assessed to be low. Historical (non-modifiable) risk factors for violence in this patient include: none.      Tests Outstanding at Discharge Requiring Follow Up:  None    Discharge Condition (required):  Improved.    Mental Status Examination at Discharge:    Appearance: overweight black male wearing purple T-shirt and shorts, appropriately groomed  Behavior: poor eye contact, irritable, uncooperative  Motor / abnormal involuntary movements: no psychomotor agitation or retardation, no involuntary movements noted   Gait: steady  and independent  Speech: curt and impolite tone, normal volume and rate  Mood: "fine"  Affect: euthymic, restricted range, congruent  Thought process: coherent, logical  Associations: linear and goal-directed  Thought content: denied suicidal and homicidal ideation, no evidence of delusional content; future oriented regarding housing, accessing his funds, possible moved back to [place]  Perceptions: no abnl  Insight / judgment: poor / poor   Sensorium: Level of consciousness awake; attentive; recent memory intact; remote memory intact  Orientation: Patient is oriented to person, place, time and  situation  Intellectual Functions: Fund of knowledge average based on grammar and vocabulary; Interpretations abstract    Clinical Global Impression - Severity: 4= Moderately ill  :Suggested Guidelines= Overt symptomatology causing noticeable, but modest, functional impairment.     [place] Functional Assessment Scale:  Time of Discharge Total Score: 22    Key Physical Exam Findings at Discharge:  No significant physical examination findings at the time of discharge.    Discharge Diet:  Regular.    Discharge Medications:     What To Do With Your Medications      START taking these medications      Add'l Info   ARIPiprazole 10 MG disintegrating tablet  Commonly known as:  ABILIFY DISCMELT  Take 1 tablet (10 mg) by mouth daily.  Start taking on:  05/09/2018   Quantity:  7 tablet  Refills:  0     lithium 300 MG Controlled-Release tablet  Commonly known as:  LITHOBID  Take 1 tablet (300 mg) by mouth 2 times daily.   Quantity:  14 tablet  Refills:  0     nicotine 14 MG/24HR  Commonly known as:  NICODERM CQ  Apply 1 patch topically daily.   Quantity:  7 patch  Refills:  0           Where to Get Your Medications      Please check with staff for printed prescription or if prescription was faxed to your pharmacy.    Bring a paper prescription for each of these medications   ARIPiprazole 10 MG disintegrating tablet   lithium 300 MG Controlled-Release tablet   nicotine 14 MG/24HR         Patient Discharging with >1 antipsychotic, including PRN antipsychotics: No    Allergies:  Allergies   Allergen Reactions   . Amoxicillin Hives   . Penicillins Hives       Discharge Disposition:  Home.    Discharge Code Status:  Full code / full care  This code status is not changed from the time of admission.    Follow Up Appointments:    [facility]  Your initial visit will be on a walk-in basis.   Walk-in hours are from 8:00 AM to 10:00 PM every day, including weekends.  [address] [place]  [address]  Tel: [phone], FAX: [phone].    AVS was faxed to this provider for coordination of care on 05/08/18 at 1200.    You were given a list of sobriety resources for 12 step support groups, rehab, and outpatient treatment services in [facility], and were encouraged by your social worker to utilize these resources to maintain sobriety.     Please utilize the [facility] [employer], [phone], 24 hours/7 days per week for psychiatric emergencies and referrals.    Discharging Physician's Contact Information:  [facility] ([facility]) at [phone].  Psychiatry Attending Addendum    I spent greater than 30 minutes in day of discharge care including examining the patient, reviewing the medical record, medical decision making, counseling and coordination of care. I agree with the findings and plan as documented above with additional comments made in underlined/italicized text above. I otherwise agree with resident physician documentation as noted.     Maxcine Ham, MD, MPH  Psychiatry Attending

## 2018-05-09 LAB — CONFIRM OPIATES, URINE
Conf. 6 Acetylmorphine: NEGATIVE ng/mL (ref 0–9)
Conf. Codeine: NEGATIVE ng/mL (ref 0–99)
Conf. EDDP: NEGATIVE ng/mL (ref 0–99)
Conf. Fentanyl: POSITIVE ng/mL — AB (ref 0–4)
Conf. Hydrocodone: NEGATIVE ng/mL (ref 0–99)
Conf. Hydromorphone: NEGATIVE ng/mL (ref 0–99)
Conf. Methadone: NEGATIVE ng/mL (ref 0–99)
Conf. Morphine: POSITIVE ng/mL — AB (ref 0–99)
Conf. Norcodeine: NEGATIVE ng/mL (ref 0–99)
Conf. Norfentanyl: POSITIVE ng/mL — AB (ref 0–4)
Conf. Norhydrocodone: NEGATIVE ng/mL (ref 0–99)
Conf. Noroxycodone: NEGATIVE ng/mL (ref 0–49)
Conf. Oxycodone: NEGATIVE ng/mL (ref 0–49)
Conf. Oxymorphone: NEGATIVE ng/mL (ref 0–49)

## 2018-05-09 LAB — CONFIRM COCAINE-URINE: Conf. Cocaine: POSITIVE — AB

## 2018-05-22 LAB — ECG 12-LEAD
ATRIAL RATE: 86 {beats}/min
ECG INTERPRETATION: NORMAL
P AXIS: 57 degrees
PR INTERVAL: 114 ms
QRS INTERVAL/DURATION: 88 ms
QT: 350 ms
QTC INTERVAL: 418 ms
R AXIS: 45 degrees
T AXIS: 65 degrees
VENTRICULAR RATE: 86 {beats}/min

## 2018-08-01 ENCOUNTER — Emergency Department (HOSPITAL_COMMUNITY)
Admission: EM | Admit: 2018-08-01 | Discharge: 2018-08-04 | Disposition: A | Discharge status: Home / Self Care | Payer: Medicaid Other | Attending: Emergency Medicine | Admitting: Emergency Medicine

## 2018-08-01 DIAGNOSIS — J45909 Unspecified asthma, uncomplicated: Secondary | ICD-10-CM | POA: Diagnosis not present

## 2018-08-01 DIAGNOSIS — F419 Anxiety disorder, unspecified: Secondary | ICD-10-CM | POA: Diagnosis not present

## 2018-08-01 DIAGNOSIS — Z046 Encounter for general psychiatric examination, requested by authority: Secondary | ICD-10-CM | POA: Insufficient documentation

## 2018-08-01 DIAGNOSIS — Z9114 Patient's other noncompliance with medication regimen: Secondary | ICD-10-CM | POA: Insufficient documentation

## 2018-08-01 DIAGNOSIS — R45850 Homicidal ideations: Secondary | ICD-10-CM | POA: Diagnosis not present

## 2018-08-01 DIAGNOSIS — F17290 Nicotine dependence, other tobacco product, uncomplicated: Secondary | ICD-10-CM | POA: Diagnosis not present

## 2018-08-01 DIAGNOSIS — R443 Hallucinations, unspecified: Secondary | ICD-10-CM | POA: Diagnosis not present

## 2018-08-01 DIAGNOSIS — G4700 Insomnia, unspecified: Secondary | ICD-10-CM | POA: Diagnosis not present

## 2018-08-01 DIAGNOSIS — R440 Auditory hallucinations: Secondary | ICD-10-CM | POA: Diagnosis present

## 2018-08-01 DIAGNOSIS — F250 Schizoaffective disorder, bipolar type: Secondary | ICD-10-CM | POA: Diagnosis not present

## 2018-08-01 DIAGNOSIS — R451 Restlessness and agitation: Secondary | ICD-10-CM | POA: Diagnosis not present

## 2018-08-01 DIAGNOSIS — Z79899 Other long term (current) drug therapy: Secondary | ICD-10-CM | POA: Insufficient documentation

## 2018-08-01 DIAGNOSIS — F918 Other conduct disorders: Secondary | ICD-10-CM | POA: Insufficient documentation

## 2018-08-01 LAB — ACETAMINOPHEN LEVEL: Acetaminophen (Tylenol), Serum: <10 ug/mL — ABNORMAL LOW (ref 10–30)

## 2018-08-01 LAB — CBC WITH DIFFERENTIAL/PLATELET
Abs Immature Granulocytes: 0.02 10*3/uL (ref 0.00–0.07)
Basophils Absolute: 0.1 10*3/uL (ref 0.0–0.1)
Basophils Relative: 1 %
Eosinophils Absolute: 0.1 10*3/uL (ref 0.0–0.5)
Eosinophils Relative: 1 %
HCT: 47.1 % (ref 39.0–52.0)
Hemoglobin: 15.1 g/dL (ref 13.0–17.0)
Immature Granulocytes: 0 %
Lymphocytes Relative: 35 %
Lymphs Abs: 2.6 10*3/uL (ref 0.7–4.0)
MCH: 27.2 pg (ref 26.0–34.0)
MCHC: 32.1 g/dL (ref 30.0–36.0)
MCV: 84.9 fL (ref 80.0–100.0)
Monocytes Absolute: 1 10*3/uL (ref 0.1–1.0)
Monocytes Relative: 13 %
Neutro Abs: 3.7 10*3/uL (ref 1.7–7.7)
Neutrophils Relative %: 50 %
Platelets: 248 10*3/uL (ref 150–400)
RBC: 5.55 MIL/uL (ref 4.22–5.81)
RDW: 12.4 % (ref 11.5–15.5)
WBC: 7.4 10*3/uL (ref 4.0–10.5)
nRBC: 0 % (ref 0.0–0.2)

## 2018-08-01 LAB — COMPREHENSIVE METABOLIC PANEL
ALT: 20 U/L (ref 0–44)
AST: 19 U/L (ref 15–41)
Albumin: 4.1 g/dL (ref 3.5–5.0)
Alkaline Phosphatase: 69 U/L (ref 38–126)
Anion gap: 9 (ref 5–15)
BUN: 10 mg/dL (ref 6–20)
CO2: 24 mmol/L (ref 22–32)
Calcium: 8.8 mg/dL — ABNORMAL LOW (ref 8.9–10.3)
Chloride: 105 mmol/L (ref 98–111)
Creatinine, Ser: 0.84 mg/dL (ref 0.61–1.24)
GFR calc Af Amer: >60 mL/min (ref >60)
GFR calc non Af Amer: >60 mL/min (ref >60)
Glucose, Bld: 96 mg/dL (ref 70–99)
Potassium: 3.6 mmol/L (ref 3.5–5.1)
Sodium: 138 mmol/L (ref 135–145)
Total Bilirubin: 0.6 mg/dL (ref 0.3–1.2)
Total Protein: 7.3 g/dL (ref 6.5–8.1)

## 2018-08-01 LAB — ETHANOL: Alcohol, Ethyl (B): <10 mg/dL (ref <10)

## 2018-08-01 LAB — SALICYLATE LEVEL: Salicylate Lvl: <7 mg/dL (ref 2.8–30.0)
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
## 2018-08-01 NOTE — ED Notes (Signed)
Patient refused vitals.

## 2018-08-01 NOTE — ED Notes (Addendum)
Patient standing in room in the dark in a stance with arms crossed.
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
## 2018-08-01 NOTE — ED Notes (Signed)
Patient calm, cooperative, resting at this time.
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
## 2018-08-02 DIAGNOSIS — F419 Anxiety disorder, unspecified: Secondary | ICD-10-CM

## 2018-08-02 DIAGNOSIS — G4700 Insomnia, unspecified: Secondary | ICD-10-CM

## 2018-08-02 DIAGNOSIS — F250 Schizoaffective disorder, bipolar type: Secondary | ICD-10-CM

## 2018-08-02 DIAGNOSIS — R45850 Homicidal ideations: Secondary | ICD-10-CM

## 2018-08-02 LAB — RAPID URINE DRUG SCREEN, HOSP PERFORMED
Amphetamines: NOT DETECTED
Barbiturates: NOT DETECTED
Benzodiazepines: NOT DETECTED
Cocaine: NOT DETECTED
Opiates: NOT DETECTED
Tetrahydrocannabinol: NOT DETECTED

## 2018-08-02 MED ORDER — ARIPIPRAZOLE 10 MG PO TABS
10.0000 mg | ORAL_TABLET | Freq: Every day | ORAL | Status: DC
  Administered 2018-08-03: 10 mg via ORAL
  Filled 2018-08-02: qty 1

## 2018-08-02 MED ORDER — TRAZODONE HCL 50 MG PO TABS: 50.0000 mg | ORAL_TABLET | Freq: Every evening | ORAL | Status: DC | PRN
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
## 2018-08-02 NOTE — ED Notes (Signed)
Pt continues to come to [place] asking he will be leaving. Pt was made aware that planning was  in process for finding placement.

## 2018-08-02 NOTE — BH Assessment (Signed)
[place] Assessment Progress Note  Per Norman Clay, MD, this pt requires psychiatric hospitalization at this time.  Dr Modesta Messing also finds that pt meets criteria for IVC, which shehas initiated.  IVC documents have been faxed to [facility], and at [employer] confirms receipt.  She has since faxed Findings and Custody Order to this Probation officer.  At 16:30 I called [employer] and spoke to [employer], who took demographic information, agreeing to dispatch law enforcement to fill out Return of Service.  Arrival of law enforcement is pending as of this writing.  The following facilities have been contacted to seek placement for this pt, with results as noted:  Beds available, information sent, decision pending:  [place]:  [facility] (pt too acute for milieu at this time)   Jalene [place]let, [place] Coordinator [phone]

## 2018-08-03 LAB — LITHIUM LEVEL: Lithium Lvl: <0.06 mmol/L — ABNORMAL LOW (ref 0.60–1.20)

## 2018-08-03 MED ORDER — ARIPIPRAZOLE 5 MG PO TABS
15.0000 mg | ORAL_TABLET | Freq: Every day | ORAL | Status: DC
  Filled 2018-08-03: qty 1

## 2018-08-03 MED ORDER — ZIPRASIDONE MESYLATE 20 MG IM SOLR
20.0000 mg | Freq: Once | INTRAMUSCULAR | Status: AC
  Administered 2018-08-03: 20 mg via INTRAMUSCULAR
  Filled 2018-08-03: qty 20

## 2018-08-03 MED ORDER — LITHIUM CARBONATE 300 MG PO CAPS
300.0000 mg | ORAL_CAPSULE | Freq: Two times a day (BID) | ORAL | Status: DC
  Administered 2018-08-03 (×2): 300 mg via ORAL
  Filled 2018-08-03 (×3): qty 1

## 2018-08-03 NOTE — ED Notes (Signed)
Pt alert and oriented. Pt denies any si, hi, and avh at this time. Pt cooperative. Pt resting in room. Pt safe, will continue to monitor.

## 2018-08-03 NOTE — ED Notes (Signed)
Gave report to Delia Chimes, looking for inpatient placement

## 2018-08-03 NOTE — BH Assessment (Signed)
[place] Assessment Progress Note   Per Norman Clay, MD, this pt continues to require psychiatric hospitalization at this time.  Pt remains under IVC initiated by Dr Modesta Messing on 08/02/2018.  The following facilities have been contacted to seek placement for this pt, with results as noted:  Beds available, information sent, decision pending:  [facility] (re-referred today) [facility]   At capacity:  [facility] [place] [facility], [place] Coordinator [phone]

## 2018-08-03 NOTE — ED Notes (Signed)
Psych Provider at bedside. 
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
## 2018-08-03 NOTE — ED Notes (Signed)
Pt is agitated and verbally abusive to staff. EDP informed. Geodon 20 mg IM given. WIll continue to monitor.

## 2018-08-03 NOTE — ED Notes (Signed)
Patient is alert and oriented x4, pt denies pain, pt c/o hearing voices and and seeing "spirits and demons", denies SI, c/o HI in general no one specific, lithium level drawn and sent to lab.  Pt fell back asleep

## 2018-08-03 NOTE — ED Notes (Signed)
Pt transferred to unit without belongings and became irate when he felt that staff he lost them. Even prior to that, he had been uncooperative with assessment or interview. Pt remains safe with checks and rounds in place. Will continue to monitor for needs/safety.

## 2018-08-03 NOTE — Progress Notes (Signed)
Patient refused VS this morning. 

## 2018-08-03 NOTE — ED Provider Notes (Signed)
1:00 AM  Pt becoming very agitated per staff.  Will give IM Geodon as patient unable to be re-directed.   Carrye Goller, Delice Bison, DO 08/03/18 [phone]

## 2018-08-04 DIAGNOSIS — R443 Hallucinations, unspecified: Secondary | ICD-10-CM

## 2018-08-04 NOTE — BH Assessment (Signed)
[facility] Assessment Progress Note  Per Norman Clay, MD, this pt does not require psychiatric hospitalization at this time.  Pt presents under IVC initiated by Dr Modesta Messing, which she has now rescinded.  Pt is to be discharged from [facility].  Pt has received ACT Team services from [facility] in the past, but they have been unable to locate the pt, and they therefore suspended providing services for the pt.  This Probation officer called them at 09:20 and spoke to [place] to ask if pt could be re-established.  Herbie [place] reports that he is uncertain, and will staff the case with the ACT Team team lead.  I asked him to call back, but as of this writing, no return call has been received.  ED staff spoke to pt about initiating ACT Team services through [facility], and initially pt was receptive to this idea, but after being released from [facility], he became eager to be discharged.  Discharge instructions include contact information for the four ACT Team service providers for pt's area of residence, with instructions to contact them at pt's own initiative at his earliest opportunity.  Pt's nurse, Nena Jordan, has been notified.  Jalene Mullet, [place] Triage Specialist [phone]

## 2018-08-04 NOTE — Consult Note (Signed)
[facility] Psych ED Discharge  08/04/2018 11:42 AM Justin Chaney  MRN:  [id number] Principal Problem: Schizoaffective disorder, bipolar type [facility]) Discharge Diagnoses: Principal Problem:   Schizoaffective disorder, bipolar type ([place])   Subjective: Pt was seen and chart reviewed with treatment team and Dr Modesta Messing. Pt is angry and irritable that he has been here for 4 days and we have not found him an inpatient bed. Pt stands in the room and glares at the providers with arms crossed over his chest. Pt can appear to be intimidating. Pt was informed that multiple facilities have been contacted on his behalf, but as of today there have been no bed offers. Pt became angry at this information and stated "just get me to hell out of here. I am ready to go." Pt, in the past has had an ACT team with [facility] and today, Aaron Edelman with [facility], tried to help him by contacting [facility] on his behalf to help him get back on an ACT team. He turned this option down and told Aaron Edelman with [facility] not to waste his time and he just wanted to leave. Pt refused to answer the questions asked by Dr Modesta Messing today. Pt is psychiatrically clear.   Total Time spent with patient: 30 minutes  Past Psychiatric History: As above  Past Medical History:  Past Medical History:  Diagnosis Date  . Asthma   . Bipolar disorder ([place])   . 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 Depression   . Homelessness   . Schizophrenia ([place])   . Seizures ([place])    childhood seizures  . Suicidal behavior   . Suicidal intent     Past Surgical History:  Procedure Laterality Date  . APPENDECTOMY    . SHUNT REMOVAL    . TONSILLECTOMY    . VENTRICULO-PERITONEAL SHUNT PLACEMENT / LAPAROSCOPIC INSERTION PERITONEAL CATHETER     Family History:  Family History  Problem Relation Age of Onset  . Hypertension Mother   . Mental illness Neg Hx    Family Psychiatric  History: Pt did not give this information Social History:   Social History   Substance and Sexual Activity  Alcohol Use No     Social History   Substance and Sexual Activity  Drug Use No  . Types: Other-see comments, Amphetamines   Comment: History of use    Social History   Socioeconomic History  . Marital status: Single    Spouse name: Not on file  . Number of children: Not on file  . Years of education: Not on file  . Highest education level: Not on file  Occupational History  . Not on file  Social Needs  . Financial resource strain: Not on file  . Food insecurity:    Worry: Not on file    Inability: Not on file  . Transportation needs:    Medical: Not on file    Non-medical: Not on file  Tobacco Use  . Smoking status: Current Every Day Smoker    Packs/day: 0.00    Types: Cigars  . Smokeless tobacco: Never Used  Substance and Sexual Activity  . Alcohol use: No  . Drug use: No    Types: Other-see comments, Amphetamines    Comment: History of use  . Sexual activity: Not on file  Lifestyle  . Physical activity:    Days per week: Not on file    Minutes per session: Not on file  . Stress: Not on  file  Relationships  . Social connections:    Talks on phone: Not on file    Gets together: Not on file    Attends religious service: Not on file    Active member of club or organization: Not on file    Attends meetings of clubs or organizations: Not on file    Relationship status: Not on file  Other Topics Concern  . Not on file  Social History Narrative  . Not on file    Has this patient used any form of tobacco in the last 30 days? (Cigarettes, Smokeless Tobacco, Cigars, and/or Pipes) Prescription not provided because: Pt declined   Current Medications: Current Facility-Administered Medications  Medication Dose Route Frequency Provider Last Rate Last Dose  . ARIPiprazole (ABILIFY) tablet 15 mg  15 mg Oral Daily Hisada, Reina, MD      . lithium carbonate capsule 300 mg  300 mg Oral BID Norman Clay, MD   300 mg at  08/03/18 2135  . traZODone (DESYREL) tablet 50 mg  50 mg Oral QHS PRN Withrow, Elyse Jarvis, FNP       Current Outpatient Medications  Medication Sig Dispense Refill  . ARIPiprazole (ABILIFY) 5 MG tablet Take 5 mg by mouth daily.   1  . lithium carbonate (LITHOBID) 300 MG CR tablet Take 300 mg by mouth daily.   1  . lithium carbonate 600 MG capsule Take 1 capsule (600 mgs) in the morning and at supper: For mood stabilization 60 capsule 0  . acetaminophen (TYLENOL) 325 MG tablet Take 2 tablets (650 mg total) by mouth every 6 (six) hours as needed. (Patient not taking: Reported on 08/01/2018) 56 tablet 0  . ARIPiprazole (ABILIFY) 10 MG tablet Take 1 tablet (10 mg total) by mouth at bedtime. For depression (Patient not taking: Reported on 03/08/2018) 30 tablet 0  . hydrOXYzine (ATARAX/VISTARIL) 25 MG tablet Take 1 tablet (25 mg total) by mouth 3 (three) times daily as needed for anxiety. (Patient not taking: Reported on 08/01/2018) 60 tablet 0  . nicotine (NICODERM CQ - DOSED IN MG/24 HOURS) 21 mg/24hr patch Place 1 patch (21 mg total) onto the skin daily. For smoking ceasation (Patient not taking: Reported on 03/08/2018) 28 patch 0  . traZODone (DESYREL) 50 MG tablet Take 1 tablet (50 mg total) by mouth at bedtime as needed for sleep. (Patient not taking: Reported on 08/01/2018) 30 tablet 0   PTA Medications: (Not in a hospital admission)   Musculoskeletal: Strength & Muscle Tone: within normal limits Gait & Station: normal Patient leans: N/A  Psychiatric Specialty Exam: Physical Exam  ROS  Blood pressure 113/73, pulse 97, temperature 98.1 F (36.7 C), temperature source Oral, resp. rate 18, SpO2 97 %.There is no height or weight on file to calculate BMI.  General Appearance: Casual  Eye Contact:  Minimal  Speech:  Clear and Coherent  Volume:  Increased  Mood:  Angry and Irritable  Affect:  Congruent  Thought Process:  Coherent, Linear and Descriptions of Associations: Intact  Orientation:   Full (Time, Place, and Person)  Thought Content:  Logical and angry that he has not gotten an inpatient bed.  Suicidal Thoughts:  No  Homicidal Thoughts:  No  Memory:  Immediate;   Good Recent;   Good Remote;   Fair  Judgement:  Fair  Insight:  Fair  Psychomotor Activity:  Restlessness  Concentration:  Concentration: Good and Attention Span: Good  Recall:  Good  Fund of Knowledge:  Good  Language:  Good  Akathisia:  No  Handed:  Right  AIMS (if indicated):     Assets:  Agricultural consultant Housing Social Support  ADL's:  Intact  Cognition:  WNL  Sleep:        Demographic Factors:  Male, Low socioeconomic status and Unemployed  Loss Factors: Financial problems/change in socioeconomic status  Historical Factors: Family history of mental illness or substance abuse  Risk Reduction Factors:   Sense of responsibility to family and Living with another person, especially a relative  Continued Clinical Symptoms:  Bipolar Disorder:   Mixed State  Cognitive Features That Contribute To Risk:  Closed-mindedness    Suicide Risk:  Minimal: No identifiable suicidal ideation.  Patients presenting with no risk factors but with morbid ruminations; may be classified as minimal risk based on the severity of the depressive symptoms    Plan Of Care/Follow-up recommendations:  Activity:  as tolerated Diet:  Heart healthy  Disposition and treatment Plan: Schizoaffective disorder, bipolar type ([place]) Take all medications as prescribed by your outpatient provider. Keep all follow-up appointments as scheduled. Please contact one of the outpatient providers in your discharge instructions to establish care for medication management. Do not consume alcohol or use illegal drugs while on prescription medications. Report any adverse effects from your medications to your primary care provider promptly.  In the event of recurrent symptoms or worsening symptoms,  call 911, a crisis hotline, or go to the nearest emergency department for evaluation.   Ethelene Hal, NP 08/04/2018, 11:42 AM

## 2018-08-04 NOTE — ED Notes (Signed)
Patient refused to allow nursing staff to retrieve vitals, nurse was notified

## 2018-08-04 NOTE — ED Notes (Signed)
Pt discharged with resources.  All belongings were returned to patient.

## 2018-08-04 NOTE — Discharge Instructions (Signed)
For your behavioral health needs, you are advised to follow up with an outpatient provider.  You are eligible for ACT Team services.  Contact any of the following ACT Team providers to ask about enrolling in their program:       [facility]      [address] [place], [place] [address]      [phone]        [facility]      [address]      [place], [place] [address]      [phone]       [facility]      [facility], [address]      [address]      [place], [place]  [address]      [phone]       [facility]      [address]      [place], [place] [address]      [phone]

## 2018-08-18 ENCOUNTER — Other Ambulatory Visit: Payer: Self-pay

## 2018-08-18 ENCOUNTER — Emergency Department (HOSPITAL_COMMUNITY)
Admission: EM | Admit: 2018-08-18 | Discharge: 2018-08-19 | Disposition: A | Discharge status: Other Acute Inpatient Hospital | Payer: Medicaid Other | Attending: Emergency Medicine | Admitting: Emergency Medicine

## 2018-08-18 DIAGNOSIS — Z7289 Other problems related to lifestyle: Secondary | ICD-10-CM | POA: Insufficient documentation

## 2018-08-18 DIAGNOSIS — R443 Hallucinations, unspecified: Secondary | ICD-10-CM | POA: Insufficient documentation

## 2018-08-18 DIAGNOSIS — Z79899 Other long term (current) drug therapy: Secondary | ICD-10-CM | POA: Insufficient documentation

## 2018-08-18 DIAGNOSIS — R45851 Suicidal ideations: Secondary | ICD-10-CM | POA: Insufficient documentation

## 2018-08-18 DIAGNOSIS — J45909 Unspecified asthma, uncomplicated: Secondary | ICD-10-CM | POA: Insufficient documentation

## 2018-08-18 DIAGNOSIS — F329 Major depressive disorder, single episode, unspecified: Secondary | ICD-10-CM | POA: Diagnosis present

## 2018-08-18 DIAGNOSIS — F17290 Nicotine dependence, other tobacco product, uncomplicated: Secondary | ICD-10-CM | POA: Insufficient documentation

## 2018-08-18 DIAGNOSIS — Z046 Encounter for general psychiatric examination, requested by authority: Secondary | ICD-10-CM | POA: Insufficient documentation

## 2018-08-18 DIAGNOSIS — F1910 Other psychoactive substance abuse, uncomplicated: Secondary | ICD-10-CM | POA: Diagnosis not present

## 2018-08-18 DIAGNOSIS — R110 Nausea: Secondary | ICD-10-CM | POA: Diagnosis not present

## 2018-08-18 LAB — COMPREHENSIVE METABOLIC PANEL
ALT: 28 U/L (ref 0–44)
AST: 28 U/L (ref 15–41)
Albumin: 3.9 g/dL (ref 3.5–5.0)
Alkaline Phosphatase: 60 U/L (ref 38–126)
Anion gap: 10 (ref 5–15)
BUN: 11 mg/dL (ref 6–20)
CO2: 23 mmol/L (ref 22–32)
Calcium: 9.1 mg/dL (ref 8.9–10.3)
Chloride: 107 mmol/L (ref 98–111)
Creatinine, Ser: 0.74 mg/dL (ref 0.61–1.24)
GFR calc Af Amer: >60 mL/min (ref >60)
GFR calc non Af Amer: >60 mL/min (ref >60)
Glucose, Bld: 109 mg/dL — ABNORMAL HIGH (ref 70–99)
Potassium: 3.7 mmol/L (ref 3.5–5.1)
Sodium: 140 mmol/L (ref 135–145)
Total Bilirubin: 0.6 mg/dL (ref 0.3–1.2)
Total Protein: 7 g/dL (ref 6.5–8.1)

## 2018-08-18 LAB — LITHIUM LEVEL: Lithium Lvl: <0.06 mmol/L — ABNORMAL LOW (ref 0.60–1.20)

## 2018-08-18 LAB — CBC
HCT: 43 % (ref 39.0–52.0)
Hemoglobin: 13.9 g/dL (ref 13.0–17.0)
MCH: 27.4 pg (ref 26.0–34.0)
MCHC: 32.3 g/dL (ref 30.0–36.0)
MCV: 84.8 fL (ref 80.0–100.0)
Platelets: 270 10*3/uL (ref 150–400)
RBC: 5.07 MIL/uL (ref 4.22–5.81)
RDW: 12 % (ref 11.5–15.5)
WBC: 8.6 10*3/uL (ref 4.0–10.5)
nRBC: 0 % (ref 0.0–0.2)

## 2018-08-18 LAB — RAPID URINE DRUG SCREEN, HOSP PERFORMED
Amphetamines: NOT DETECTED
Barbiturates: NOT DETECTED
Benzodiazepines: NOT DETECTED
Cocaine: NOT DETECTED
Opiates: NOT DETECTED
Tetrahydrocannabinol: NOT DETECTED

## 2018-08-18 MED ORDER — HYDROXYZINE HCL 25 MG PO TABS: 25.0000 mg | ORAL_TABLET | Freq: Three times a day (TID) | ORAL | Status: DC | PRN

## 2018-08-18 MED ORDER — TRAZODONE HCL 50 MG PO TABS: 50.0000 mg | ORAL_TABLET | Freq: Every evening | ORAL | Status: DC | PRN

## 2018-08-18 MED ORDER — LITHIUM CARBONATE 300 MG PO CAPS
600.0000 mg | ORAL_CAPSULE | Freq: Two times a day (BID) | ORAL | Status: DC
  Administered 2018-08-19: 600 mg via ORAL
  Filled 2018-08-18: qty 2

## 2018-08-18 MED ORDER — NICOTINE 21 MG/24HR TD PT24: 21.0000 mg | MEDICATED_PATCH | Freq: Every day | TRANSDERMAL | Status: DC

## 2018-08-18 MED ORDER — ARIPIPRAZOLE 10 MG PO TABS
10.0000 mg | ORAL_TABLET | Freq: Every day | ORAL | Status: DC
  Administered 2018-08-19: 10 mg via ORAL
  Filled 2018-08-18 (×2): qty 1

## 2018-08-18 NOTE — ED Notes (Signed)
Patient given bag lunch and ginger ale, okay per PA.

## 2018-08-18 NOTE — Progress Notes (Signed)
Pt meets inpatient criteria per [name], NP. Referral information has been faxed to the following hospitals for review:  [facility]  [facility]  [facility]  [facility]   Disposition will continue to assist with inpatient placement needs.   Audree Camel, LCSW, [place] Disposition [place] [facility]S [phone]
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
## 2018-08-18 NOTE — ED Notes (Signed)
Belongings inventoried, locker 3

## 2018-08-18 NOTE — BH Assessment (Signed)
Tele Assessment Note   Patient Name: Justin Chaney MRN: [id number] Referring Physician: Ruthann Cancer Location of Patient: [facility] Location of Provider: [place]  Justin Chaney is an 23 y.o. male presents to [facility] alone voluntarily. Pt reprots he overdosed on Heroin today and had to be given Narcan. Pt has hx of SI and AH/VH. Pt has hx with SA with methamphetamines, cocaine, and heroin. Pt reports worsening depression and " I just want to die a slow peaceful death". Pt denies homicidal thoughts or physical aggression. Pt denies having access to firearms. Pt denies having any legal problems at this time. Pt has hx of multiple inpatient hospitalizations. Pt has outpatient services with [facility] but reports he does not have services at this time. Pt lives with friends but has past reports of homelessness.   Pt is dressed in scrubs, alert, oriented x4 with rapid speech and restless motor behavior. Eye contact is good and Pt is pleasant. Pt's mood is depressed and affect is flat. Thought process is coherent and relevant. Pt's insight is poor and judgement is impaired. Pt was cooperative throughout assessment. He says he is willing to sign voluntarily into a psychiatric facility.    Diagnosis: F25.0 Schizoaffective disorder, Bipolar type   Past Medical History:  Past Medical History:  Diagnosis Date  . Asthma   . Bipolar disorder ([place])   . 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 Depression   . Homelessness   . Schizophrenia ([place])   . Seizures ([place])    childhood seizures  . Suicidal behavior   . Suicidal intent     Past Surgical History:  Procedure Laterality Date  . APPENDECTOMY    . SHUNT REMOVAL    . TONSILLECTOMY    . VENTRICULO-PERITONEAL SHUNT PLACEMENT / LAPAROSCOPIC INSERTION PERITONEAL CATHETER      Family History:  Family History  Problem Relation Age of Onset  . Hypertension Mother   . Mental illness Neg Hx     Social History:   reports that he has been smoking cigars. He has been smoking about 0.00 packs per day. He has never used smokeless tobacco. He reports that he does not drink alcohol or use drugs.  Additional Social History:  Alcohol / Drug Use Pain Medications: see MAR Prescriptions: see MAR Over the Counter: see MAR History of alcohol / drug use?: Yes Longest period of sobriety (when/how long): Patient reports being clean for 1 year Negative Consequences of Use: Financial, Scientist, research (physical sciences), Work / School Substance #1 Name of Substance 1: Methamphetamine 1 - Age of First Use: 15 1 - Amount (size/oz): UTA 1 - Frequency: 2x daily 1 - Duration: 5 years 1 - Last Use / Amount: 08/18/2018  CIWA: CIWA-Ar BP: (!) 145/93 Pulse Rate: 86 COWS:    Allergies:  Allergies  Allergen Reactions  . Amoxicillin Hives and Other (See Comments)    CHILDHOOD ALLERGY Has patient had a PCN reaction causing immediate rash, facial/tongue/throat swelling, SOB or lightheadedness with hypotension: YES Has patient had a PCN reaction causing severe rash involving mucus membranes or skin necrosis: No Has patient had a PCN reaction that required hospitalization No Has patient had a PCN reaction occurring within the last 10 years: No If all of the above answers are "NO", then may proceed with Cephalosporin use.  Marland Kitchen Penicillins Hives and Other (See Comments)    CHILDHOOD ALLERGY Has patient had a PCN reaction causing immediate rash, facial/tongue/throat swelling, SOB or lightheadedness with hypotension: No Has patient  had a PCN reaction causing severe rash involving mucus membranes or skin necrosis: No Has patient had a PCN reaction that required hospitalization No Has patient had a PCN reaction occurring within the last 10 years: No If all of the above answers are "NO", then may proceed with Cephalosporin use.    Home Medications: (Not in a hospital admission)   OB/GYN Status:  No LMP for male patient.  General Assessment  Data Location of Assessment: [facility] ED TTS Assessment: In system Is this a Tele or Face-to-Face Assessment?: Tele Assessment Is this an Initial Assessment or a Re-assessment for this encounter?: Initial Assessment Language Other than English: No Living Arrangements: Homeless/Shelter What gender do you identify as?: Male Marital status: Single Living Arrangements: (Homeless) Can pt return to current living arrangement?: Yes Admission Status: Voluntary Is patient capable of signing voluntary admission?: Yes Referral Source: Self/Family/Friend Insurance type: Medicaid     Crisis Care Plan Living Arrangements: (Homeless)     Risk to self with the past 6 months Suicidal Ideation: Yes-Currently Present Has patient been a risk to self within the past 6 months prior to admission? : No Suicidal Intent: Yes-Currently Present Has patient had any suicidal intent within the past 6 months prior to admission? : No Is patient at risk for suicide?: Yes Suicidal Plan?: Yes-Currently Present(Pt reports he tried to overdose on heroin) Has patient had any suicidal plan within the past 6 months prior to admission? : No Specify Current Suicidal Plan: Pt reports he overdosed on Heroin Access to Means: Yes Specify Access to Suicidal Means: Heroin an other narcotics What has been your use of drugs/alcohol within the last 12 months?: Cocain Heroin Methamphetimines Previous Attempts/Gestures: Yes How many times?: 4 Other Self Harm Risks: Cutting Triggers for Past Attempts: None known Intentional Self Injurious Behavior: Cutting Comment - Self Injurious Behavior: Cutting Family Suicide History: No Recent stressful life event(s): Conflict (Comment), Loss (Comment), Trauma (Comment) Persecutory voices/beliefs?: No Depression: Yes Depression Symptoms: Insomnia, Loss of interest in usual pleasures, Feeling worthless/self pity Substance abuse history and/or treatment for substance abuse?: Yes Suicide  prevention information given to non-admitted patients: Not applicable  Risk to Others within the past 6 months Homicidal Ideation: No Does patient have any lifetime risk of violence toward others beyond the six months prior to admission? : No Thoughts of Harm to Others: No Current Homicidal Intent: No Current Homicidal Plan: No Access to Homicidal Means: No History of harm to others?: No Assessment of Violence: None Noted Violent Behavior Description: None Does patient have access to weapons?: No Criminal Charges Pending?: No Does patient have a court date: No Is patient on probation?: No  Psychosis Hallucinations: Auditory, Visual Delusions: None noted  Mental Status Report Appearance/Hygiene: In scrubs Eye Contact: Good Motor Activity: Freedom of movement, Hyperactivity, Shuffling Speech: Logical/coherent, Rapid Level of Consciousness: Alert Mood: Depressed Affect: Flat Anxiety Level: None Thought Processes: Coherent, Relevant Judgement: Impaired Orientation: Person, Place, Time, Situation Obsessive Compulsive Thoughts/Behaviors: None  Cognitive Functioning Concentration: Normal Memory: Recent Intact Is patient IDD: No Insight: Poor Impulse Control: Poor Appetite: Good Have you had any weight changes? : No Change Sleep: Decreased Total Hours of Sleep: 4 Vegetative Symptoms: None  ADLScreening [facility] Assessment Services) Patient's cognitive ability adequate to safely complete daily activities?: Yes Patient able to express need for assistance with ADLs?: Yes Independently performs ADLs?: Yes (appropriate for developmental age)  Prior Inpatient Therapy Prior Inpatient Therapy: Yes Prior Therapy Dates: 2019, 2018, numerous Prior Therapy Facilty/Provider(s): [facility], [place] Reason  for Treatment: Schizoaffective disorder  Prior Outpatient Therapy Prior Outpatient Therapy: Yes Prior Therapy Dates: ongoing Prior Therapy Facilty/Provider(s): [facility] Reason for Treatment:  schizoaffective disorder Does patient have an ACCT team?: No Does patient have Intensive In-House Services?  : No Does patient have [facility] services? : No Does patient have P4CC services?: No  ADL Screening (condition at time of admission) Patient's cognitive ability adequate to safely complete daily activities?: Yes Is the patient deaf or have difficulty hearing?: No Does the patient have difficulty seeing, even when wearing glasses/contacts?: No Does the patient have difficulty concentrating, remembering, or making decisions?: No Patient able to express need for assistance with ADLs?: Yes Does the patient have difficulty dressing or bathing?: No Independently performs ADLs?: Yes (appropriate for developmental age) Does the patient have difficulty walking or climbing stairs?: No Weakness of Legs: None Weakness of Arms/Hands: None  Home Assistive Devices/Equipment Home Assistive Devices/Equipment: None  Therapy Consults (therapy consults require a physician order) PT Evaluation Needed: No OT Evalulation Needed: No SLP Evaluation Needed: No Abuse/Neglect Assessment (Assessment to be complete while patient is alone) Abuse/Neglect Assessment Can Be Completed: Yes Physical Abuse: Denies Verbal Abuse: Denies Sexual Abuse: Denies Exploitation of patient/patient's resources: Denies Self-Neglect: Denies Values / Beliefs Cultural Requests During Hospitalization: None Spiritual Requests During Hospitalization: None Consults Spiritual Care Consult Needed: No Social Work Consult Needed: No Regulatory affairs officer (For Healthcare) Does Patient Have a Medical Advance Directive?: No Does patient want to make changes to medical advance directive?: No - Patient declined Would patient like information on creating a medical advance directive?: No - Patient declined          Disposition:  Disposition Initial Assessment Completed for this Encounter: Yes  Per [name], NP pt meets  inpatient criteria. CSW to look for placement.   This service was provided via telemedicine using a 2-way, interactive audio and video technology.  Names of all persons participating in this telemedicine service and their role in this encounter. Name: Wissam Resor Role: Pt  Name: Steffanie Rainwater, [place], [facility] Role: Therapeutic Triage Specialist  Name:  Role:   Name:  Role:     Steffanie Rainwater, [place], [facility] 08/18/2018 10:17 PM

## 2018-08-18 NOTE — ED Provider Notes (Addendum)
[facility] EMERGENCY DEPARTMENT Provider Note   CSN: [id number] Arrival date & time: 08/18/18  1637     History   Chief Complaint Chief Complaint  Patient presents with  . Suicidal    HPI Sayeed Weatherall is a 23 y.o. male who presents with SI with plan to OD on heroin. PMH significant for homelessness, schizophrenia, seizures, asthma, bipolar d/o, hydrocephalus s/p shunt, polysubstance abuse. He was discharged from [facility] on Jan 3rd although pt denies this and states he hasn't been here in years. He is supposed to be taking Lithium. He states he wants to kill himself. He has hopelessness, worthlessness. He doesn't have any close friends or family although he lives with a friend currently. He states he tried to OD on heroin earlier today in the woods. It didn't work so he tried to kill himself again by injecting in the arm and missed so he decided to come to the ED. He denies any headache, chest pain, abdominal pain, vomiting.  He has some nausea without vomiting.  He also states that he uses meth and cocaine.  HPI  Past Medical History:  Diagnosis Date  . Asthma   . Bipolar disorder ([place])   . 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 Depression   . Homelessness   . Schizophrenia ([place])   . Seizures ([place])    childhood seizures  . Suicidal behavior   . Suicidal intent     Patient Active Problem List   Diagnosis Date Noted  . Cocaine use disorder, severe, in early remission ([place]) 12/21/2016  . 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 ADHD (attention deficit hyperactivity disorder) 12/21/2016  . 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 Tobacco use disorder 12/21/2016  . Tobacco use 10/06/2016  . Homicidal ideations 09/16/2016  . Suicidal ideation   . Hallucinations   . Homicidal ideation   . Suicidal thoughts   . Cannabis use disorder, moderate, in sustained remission ([place]) 11/07/2015  . Depression 10/29/2015  . Intermittent explosive  disorder 09/05/2015  . Schizoaffective disorder, bipolar type ([place]) 09/04/2015  . Malingering 06/05/2015  . Antisocial personality disorder ([place]) 06/03/2015  . Cocaine abuse ([place]) 06/03/2015    Past Surgical History:  Procedure Laterality Date  . APPENDECTOMY    . SHUNT REMOVAL    . TONSILLECTOMY    . VENTRICULO-PERITONEAL SHUNT PLACEMENT / LAPAROSCOPIC INSERTION PERITONEAL CATHETER          Home Medications    Prior to Admission medications   Medication Sig Start Date End Date Taking? Authorizing Provider  acetaminophen (TYLENOL) 325 MG tablet Take 2 tablets (650 mg total) by mouth every 6 (six) hours as needed. Patient not taking: Reported on 08/01/2018 01/03/18   Langston Masker B, PA-C  ARIPiprazole (ABILIFY) 10 MG tablet Take 1 tablet (10 mg total) by mouth at bedtime. For depression Patient not taking: Reported on 03/08/2018 12/30/16   Hughie Closs A, NP  ARIPiprazole (ABILIFY) 5 MG tablet Take 5 mg by mouth daily.  11/29/17   [provider]  hydrOXYzine (ATARAX/VISTARIL) 25 MG tablet Take 1 tablet (25 mg total) by mouth 3 (three) times daily as needed for anxiety. Patient not taking: Reported on 08/01/2018 12/30/16   Hughie Closs A, NP  lithium carbonate (LITHOBID) 300 MG CR tablet Take 300 mg by mouth daily.  10/26/17   [provider]  lithium carbonate 600 MG capsule Take 1 capsule (600 mgs) in the morning and at supper: For mood  stabilization 12/30/16   Okonkwo, Justina A, NP  nicotine (NICODERM CQ - DOSED IN MG/24 HOURS) 21 mg/24hr patch Place 1 patch (21 mg total) onto the skin daily. For smoking ceasation Patient not taking: Reported on 03/08/2018 12/31/16   Hughie Closs A, NP  traZODone (DESYREL) 50 MG tablet Take 1 tablet (50 mg total) by mouth at bedtime as needed for sleep. Patient not taking: Reported on 08/01/2018 12/30/16   Vicenta Aly, NP    Family History Family History  Problem Relation Age of Onset  . Hypertension Mother   .  Mental illness Neg Hx     Social History Social History   Tobacco Use  . Smoking status: Current Every Day Smoker    Packs/day: 0.00    Types: Cigars  . Smokeless tobacco: Never Used  Substance Use Topics  . Alcohol use: No  . Drug use: No    Types: Other-see comments, Amphetamines    Comment: History of use     Allergies   Amoxicillin and Penicillins   Review of Systems Review of Systems  Constitutional: Negative for fever.  Respiratory: Negative for shortness of breath.   Cardiovascular: Negative for chest pain.  Gastrointestinal: Positive for nausea. Negative for abdominal pain, diarrhea and vomiting.  Psychiatric/Behavioral: Positive for dysphoric mood, self-injury and suicidal ideas.  All other systems reviewed and are negative.    Physical Exam Updated Vital Signs BP (!) 145/93 (BP Location: Left Arm)   Pulse 86   Temp 98.1 F (36.7 C) (Oral)   Resp 14   SpO2 98%   Physical Exam Vitals signs and nursing note reviewed.  Constitutional:      General: He is not in acute distress.    Appearance: Normal appearance. He is well-developed.     Comments: Calm and cooperative  HENT:     Head: Normocephalic and atraumatic.  Eyes:     General: No scleral icterus.       Right eye: No discharge.        Left eye: No discharge.     Conjunctiva/sclera: Conjunctivae normal.     Pupils: Pupils are equal, round, and reactive to light.  Neck:     Musculoskeletal: Normal range of motion.  Cardiovascular:     Rate and Rhythm: Normal rate and regular rhythm.  Pulmonary:     Effort: Pulmonary effort is normal. No respiratory distress.     Breath sounds: Normal breath sounds.  Abdominal:     General: There is no distension.     Palpations: Abdomen is soft.     Tenderness: There is no abdominal tenderness.  Musculoskeletal:     Comments: Raised area over the left antecubital space that patient points to is where he injected heroin earlier today.  Skin:    General:  Skin is warm and dry.  Neurological:     Mental Status: He is alert and oriented to person, place, and time.  Psychiatric:        Mood and Affect: Affect is flat.        Speech: Speech normal.        Behavior: Behavior normal.        Thought Content: Thought content includes suicidal ideation. Thought content includes suicidal plan.        Judgment: Judgment is impulsive.      ED Treatments / Results  Labs (all labs ordered are listed, but only abnormal results are displayed) Labs Reviewed  COMPREHENSIVE METABOLIC PANEL - Abnormal; Notable  for the following components:      Result Value   Glucose, Bld 109 (*)    All other components within normal limits  CBC  RAPID URINE DRUG SCREEN, HOSP PERFORMED  ETHANOL  SALICYLATE LEVEL  ACETAMINOPHEN LEVEL  LITHIUM LEVEL    EKG None  Radiology No results found.  Procedures Procedures (including critical care time)  Medications Ordered in ED Medications - No data to display   Initial Impression / Assessment and Plan / ED Course  I have reviewed the triage vital signs and the nursing notes.  Pertinent labs & imaging results that were available during my care of the patient were reviewed by me and considered in my medical decision making (see chart for details).  23 year old male presents with suicidal ideation with attempt to OD on heroin.  He is hypertensive but otherwise vital signs are normal.  He did not seem intoxicated on exam.  Interestingly his drug screen is negative therefore question secondary gain.  Labs are normal.  Patient medically cleared  11:13 PM Pt accepted at [facility] tomorrow   Final Clinical Impressions(s) / ED Diagnoses   Final diagnoses:  Suicidal ideation  Polysubstance abuse [facility])    ED Discharge Orders    None       Recardo Evangelist, PA-C 08/18/18 2107    Recardo Evangelist, PA-C 08/18/18 2313    Dorie Rank, MD 08/19/18 1622

## 2018-08-18 NOTE — BH Assessment (Signed)
Received call from [facility] stating that Dr. [name] Sports has accepted Pt to their [place]. They request Pt arrive as close to 0900 as possible. Nursing report can be called to [phone]. Notified Janetta Hora, PA-C and Otila Kluver, RN of acceptance.   Orpah Greek Anson Fret, LPC, [facility] Triage Specialist [phone]

## 2018-08-19 NOTE — ED Notes (Signed)
Pt is awake and eating breakfast.  Medication was given. Pt signed papers for discharge.

## 2018-08-19 NOTE — ED Notes (Signed)
Report has been received from night shift. Pt is currently asleep.

## 2018-08-19 NOTE — ED Notes (Signed)
ALL belongings - 2 labeled belongings bags and 1 valuables envelope - [place] - Pt aware.

## 2018-08-19 NOTE — ED Notes (Signed)
Pt asked for a cup of ice and ginger ale. Pt was given beverage.

## 2018-08-19 NOTE — ED Notes (Signed)
Pt eating breakfast. Pt voiced understanding and agreement w/tx plan - accepted to [facility].

## 2018-08-19 NOTE — ED Notes (Signed)
Pt came out of room and stated he woke up on the floor. The sitter and I informed pt he was not on the floor. His feet were hanging off the bed while he was sleeping. Pt dismissed it and asked for cookies. I informed pt his breakfast will come usually between 7am-8am. Pt walked back in room and allowed sitter to obtain morning vitals.

## 2018-08-19 NOTE — ED Notes (Signed)
When asking pt about [facility], pt states "oh yeah, they're here." When asked who is here pt states, "I know you don't know, and I don't really know, but they're here."

## 2018-08-19 NOTE — ED Notes (Signed)
Meal tray was given - placed on side table. Pt is asleep.

## 2019-01-09 ENCOUNTER — Emergency Department (HOSPITAL_COMMUNITY)
Admission: EM | Admit: 2019-01-09 | Discharge: 2019-01-10 | Disposition: A | Discharge status: Other Acute Inpatient Hospital | Payer: Medicaid Other | Attending: Emergency Medicine | Admitting: Emergency Medicine

## 2019-01-09 ENCOUNTER — Encounter (HOSPITAL_COMMUNITY): Payer: Self-pay | Admitting: Emergency Medicine

## 2019-01-09 ENCOUNTER — Other Ambulatory Visit: Payer: Self-pay

## 2019-01-09 DIAGNOSIS — Z79899 Other long term (current) drug therapy: Secondary | ICD-10-CM | POA: Insufficient documentation

## 2019-01-09 DIAGNOSIS — Z046 Encounter for general psychiatric examination, requested by authority: Secondary | ICD-10-CM | POA: Insufficient documentation

## 2019-01-09 DIAGNOSIS — F209 Schizophrenia, unspecified: Secondary | ICD-10-CM | POA: Insufficient documentation

## 2019-01-09 DIAGNOSIS — F17290 Nicotine dependence, other tobacco product, uncomplicated: Secondary | ICD-10-CM | POA: Insufficient documentation

## 2019-01-09 DIAGNOSIS — R45851 Suicidal ideations: Secondary | ICD-10-CM | POA: Insufficient documentation

## 2019-01-09 DIAGNOSIS — F329 Major depressive disorder, single episode, unspecified: Secondary | ICD-10-CM | POA: Insufficient documentation

## 2019-01-09 DIAGNOSIS — Z20828 Contact with and (suspected) exposure to other viral communicable diseases: Secondary | ICD-10-CM | POA: Insufficient documentation

## 2019-01-09 DIAGNOSIS — Z7289 Other problems related to lifestyle: Secondary | ICD-10-CM | POA: Insufficient documentation

## 2019-01-09 DIAGNOSIS — J45909 Unspecified asthma, uncomplicated: Secondary | ICD-10-CM | POA: Insufficient documentation

## 2019-01-09 DIAGNOSIS — Z9114 Patient's other noncompliance with medication regimen: Secondary | ICD-10-CM | POA: Insufficient documentation

## 2019-01-09 LAB — CBC
HCT: 43.4 % (ref 39.0–52.0)
Hemoglobin: 14.5 g/dL (ref 13.0–17.0)
MCH: 28.4 pg (ref 26.0–34.0)
MCHC: 33.4 g/dL (ref 30.0–36.0)
MCV: 84.9 fL (ref 80.0–100.0)
Platelets: 285 10*3/uL (ref 150–400)
RBC: 5.11 MIL/uL (ref 4.22–5.81)
RDW: 11.5 % (ref 11.5–15.5)
WBC: 7.5 10*3/uL (ref 4.0–10.5)
nRBC: 0 % (ref 0.0–0.2)

## 2019-01-09 LAB — RAPID URINE DRUG SCREEN, HOSP PERFORMED
Amphetamines: NOT DETECTED
Barbiturates: NOT DETECTED
Benzodiazepines: NOT DETECTED
Cocaine: NOT DETECTED
Opiates: NOT DETECTED
Tetrahydrocannabinol: NOT DETECTED

## 2019-01-09 LAB — COMPREHENSIVE METABOLIC PANEL
ALT: 28 U/L (ref 0–44)
AST: 29 U/L (ref 15–41)
Albumin: 4.2 g/dL (ref 3.5–5.0)
Alkaline Phosphatase: 69 U/L (ref 38–126)
Anion gap: 11 (ref 5–15)
BUN: 10 mg/dL (ref 6–20)
CO2: 23 mmol/L (ref 22–32)
Calcium: 9.2 mg/dL (ref 8.9–10.3)
Chloride: 101 mmol/L (ref 98–111)
Creatinine, Ser: 0.82 mg/dL (ref 0.61–1.24)
GFR calc Af Amer: >60 mL/min (ref >60)
GFR calc non Af Amer: >60 mL/min (ref >60)
Glucose, Bld: 101 mg/dL — ABNORMAL HIGH (ref 70–99)
Potassium: 3.4 mmol/L — ABNORMAL LOW (ref 3.5–5.1)
Sodium: 135 mmol/L (ref 135–145)
Total Bilirubin: 0.7 mg/dL (ref 0.3–1.2)
Total Protein: 7.1 g/dL (ref 6.5–8.1)

## 2019-01-09 NOTE — ED Provider Notes (Signed)
[facility] EMERGENCY DEPARTMENT Provider Note   CSN: [id number] Arrival date & time: 01/09/19  2229    History   Chief Complaint Chief Complaint  Patient presents with  . Suicidal    HPI Justin Chaney is a 23 y.o. male presenting for evaluation of suicidal thoughts, auditory, and visual hallucinations.  Patient states he has had gradually worsening symptoms over the past several months.  He reports he is very depressed and suicidal, he attempted to hang himself several days ago, had no loss of consciousness.  He is having no neck pain.  2 days he took a whole bunch of pills and attempted overdose and kill himself, he does not know what he took, the pills were "from a friend."  He has had no symptoms since, including drowsiness, chest pain, nausea, vomiting, abdominal pain, diarrhea, constipation.  Patient states he is also hearing voices all over, including her mother calyxes.  The voices are threatening and telling him to kill himself.  He also states he has not slept in several weeks, although he does nap sometimes during the day.  He reports seeing black spots.  He denies homicidal thoughts.  He reports a history of schizophrenia and bipolar, states that at one point he was on medication but he cannot remember when he last took any medicine.  He quit smoking last month.  Denies alcohol or drug use.  Patient states his mom dropped him off today, this is why he is here.  He is here under voluntary commitment. 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 He denies fevers, HA, cough.      HPI  Past Medical History:  Diagnosis Date  . Asthma   . Bipolar disorder ([place])   . 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 Depression   . Homelessness   . Schizophrenia ([place])   . Seizures ([place])    childhood  seizures  . Suicidal behavior   . Suicidal intent     Patient Active Problem List   Diagnosis Date Noted  . Cocaine use disorder, severe, in early remission ([place]) 12/21/2016  . 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 ADHD (attention deficit hyperactivity disorder) 12/21/2016  . 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 Tobacco use disorder 12/21/2016  . Tobacco use 10/06/2016  . Homicidal ideations 09/16/2016  . Suicidal ideation   . Hallucinations   . Homicidal ideation   . Suicidal thoughts   . Cannabis use disorder, moderate, in sustained remission ([place]) 11/07/2015  . Depression 10/29/2015  . Intermittent explosive disorder 09/05/2015  . Schizoaffective disorder, bipolar type ([place]) 09/04/2015  . Malingering 06/05/2015  . Antisocial personality disorder ([place]) 06/03/2015  . Cocaine abuse ([place]) 06/03/2015    Past Surgical History:  Procedure Laterality Date  . APPENDECTOMY    . SHUNT REMOVAL    . TONSILLECTOMY    . VENTRICULO-PERITONEAL SHUNT PLACEMENT / LAPAROSCOPIC INSERTION PERITONEAL CATHETER          Home Medications    Prior to Admission medications   Medication Sig Start Date End Date Taking? Authorizing Provider  acetaminophen (TYLENOL) 325 MG tablet Take 2 tablets (650 mg total) by mouth every 6 (six) hours as needed. Patient not taking: Reported on 08/01/2018 01/03/18   Langston Masker B, PA-C  ARIPiprazole (ABILIFY) 10 MG  tablet Take 1 tablet (10 mg total) by mouth at bedtime. For depression 12/30/16   Hughie Closs A, NP  hydrOXYzine (ATARAX/VISTARIL) 25 MG tablet Take 1 tablet (25 mg total) by mouth 3 (three) times daily as needed for anxiety. Patient not taking: Reported on 08/01/2018 12/30/16   Hughie Closs A, NP  lithium carbonate (LITHOBID) 300 MG CR tablet Take 300 mg by mouth 2 (two) times daily.  10/26/17   [provider]  lithium carbonate 600 MG capsule Take 1 capsule (600 mgs) in the morning and at supper: For mood stabilization  Patient not taking: Reported on 08/18/2018 12/30/16   Hughie Closs A, NP  nicotine (NICODERM CQ - DOSED IN MG/24 HOURS) 21 mg/24hr patch Place 1 patch (21 mg total) onto the skin daily. For smoking ceasation Patient not taking: Reported on 03/08/2018 12/31/16   Hughie Closs A, NP  traZODone (DESYREL) 50 MG tablet Take 1 tablet (50 mg total) by mouth at bedtime as needed for sleep. Patient not taking: Reported on 08/01/2018 12/30/16   Vicenta Aly, NP    Family History Family History  Problem Relation Age of Onset  . Hypertension Mother   . Mental illness Neg Hx     Social History Social History   Tobacco Use  . Smoking status: Current Every Day Smoker    Packs/day: 0.00    Types: Cigars  . Smokeless tobacco: Never Used  Substance Use Topics  . Alcohol use: No  . Drug use: No    Types: Other-see comments, Amphetamines    Comment: History of use     Allergies   Amoxicillin and Penicillins   Review of Systems Review of Systems  Psychiatric/Behavioral: Positive for hallucinations, self-injury, sleep disturbance and suicidal ideas.  All other systems reviewed and are negative.    Physical Exam Updated Vital Signs BP 135/81 (BP Location: Left Arm)   Pulse (!) 102   Temp 98.6 F (37 C) (Oral)   Resp 16   SpO2 98%   Physical Exam Vitals signs and nursing note reviewed.  Constitutional:      General: He is not in acute distress.    Appearance: He is well-developed.     Comments: Appears nontoxic  HENT:     Head: Normocephalic and atraumatic.     Comments: Airway intact. No swelling. Neck:     Musculoskeletal: Normal range of motion and neck supple.     Comments: No sign of neck injury,s welling or pain. Full active ROM of the head without pain. Pulmonary:     Effort: Pulmonary effort is normal.  Abdominal:     General: There is no distension.  Musculoskeletal: Normal range of motion.  Skin:    General: Skin is warm.     Findings: No rash.   Neurological:     Mental Status: He is alert and oriented to person, place, and time.  Psychiatric:        Attention and Perception: He perceives auditory and visual hallucinations.        Speech: Speech is rapid and pressured.        Thought Content: Thought content includes suicidal ideation. Thought content includes suicidal plan.     Comments: Cooperative with exam.  Rapid and pressured speech.  Patient reporting SI with attempt self-harm in the past several days.  Reporting auditory and visual hallucinations.      ED Treatments / Results  Labs (all labs ordered are listed, but only abnormal results are displayed)  Labs Reviewed  NOVEL CORONAVIRUS, NAA (HOSPITAL ORDER, SEND-OUT TO REF LAB)  COMPREHENSIVE METABOLIC PANEL  ETHANOL  SALICYLATE LEVEL  ACETAMINOPHEN LEVEL  CBC  RAPID URINE DRUG SCREEN, HOSP PERFORMED    EKG None  Radiology No results found.  Procedures Procedures (including critical care time)  Medications Ordered in ED Medications - No data to display   Initial Impression / Assessment and Plan / ED Course  I have reviewed the triage vital signs and the nursing notes.  Pertinent labs & imaging results that were available during my care of the patient were reviewed by me and considered in my medical decision making (see chart for details).        Patient presenting for evaluation of suicidal thoughts, auditory, visual hallucinations.  Physical exam patient.  Cooperative, confused speech.  History of bipolar and schizophrenia, not on medication. Medical clearance labs ordered. covid test ordered. As pt is not taking home meds, no home meds ordered.  Physical exam reassuring, no sign of neck injury, pain, or swelling. Doubt he actually tried to hang himself. I do not believe he needs imaging at this time. No obvious side effects from attempted OD with unknown pills. As this was 2 days ago, I do not believe he will need further obvs for this.   Pt  attempting to leave the dept. He is reports SI with attempts, and has auditory and visual hallucinations. IVC paperwork completed.   Oncoming PA made aware that pt is pending TTS.  Final Clinical Impressions(s) / ED Diagnoses   Final diagnoses:  None    ED Discharge Orders    None       Franchot Heidelberg, PA-C 01/10/19 1315    Dorie Rank, MD 01/11/19 [phone]

## 2019-01-09 NOTE — ED Triage Notes (Signed)
Pt reports feeling suicidal, no attempts or concrete plan. Also states that he hasn't slept in days, is "hearing voices from hell". Pt has not been taking his prescribed medications.

## 2019-01-10 ENCOUNTER — Other Ambulatory Visit: Payer: Self-pay

## 2019-01-10 ENCOUNTER — Encounter (HOSPITAL_COMMUNITY): Payer: Self-pay

## 2019-01-10 ENCOUNTER — Inpatient Hospital Stay (HOSPITAL_COMMUNITY)
Admission: AD | Admit: 2019-01-10 | Discharge: 2019-01-16 | DRG: 885 | Disposition: A | Discharge status: Home / Self Care | Payer: Medicaid Other | Attending: Psychiatry | Admitting: Psychiatry

## 2019-01-10 ENCOUNTER — Other Ambulatory Visit: Payer: Self-pay | Admitting: Behavioral Health

## 2019-01-10 DIAGNOSIS — F17290 Nicotine dependence, other tobacco product, uncomplicated: Secondary | ICD-10-CM | POA: Diagnosis present

## 2019-01-10 DIAGNOSIS — Z1159 Encounter for screening for other viral diseases: Secondary | ICD-10-CM

## 2019-01-10 DIAGNOSIS — R45851 Suicidal ideations: Secondary | ICD-10-CM | POA: Diagnosis present

## 2019-01-10 DIAGNOSIS — Z915 Personal history of self-harm: Secondary | ICD-10-CM | POA: Diagnosis not present

## 2019-01-10 DIAGNOSIS — F419 Anxiety disorder, unspecified: Secondary | ICD-10-CM | POA: Diagnosis present

## 2019-01-10 DIAGNOSIS — F259 Schizoaffective disorder, unspecified: Principal | ICD-10-CM | POA: Diagnosis present

## 2019-01-10 DIAGNOSIS — J45909 Unspecified asthma, uncomplicated: Secondary | ICD-10-CM | POA: Diagnosis present

## 2019-01-10 DIAGNOSIS — F250 Schizoaffective disorder, bipolar type: Secondary | ICD-10-CM | POA: Diagnosis not present

## 2019-01-10 LAB — SALICYLATE LEVEL: Salicylate Lvl: <7 mg/dL (ref 2.8–30.0)

## 2019-01-10 LAB — ETHANOL: Alcohol, Ethyl (B): <10 mg/dL (ref <10)

## 2019-01-10 LAB — ACETAMINOPHEN LEVEL: Acetaminophen (Tylenol), Serum: <10 ug/mL — ABNORMAL LOW (ref 10–30)

## 2019-01-10 MED ORDER — ALUM & MAG HYDROXIDE-SIMETH 200-200-20 MG/5ML PO SUSP: 30.0000 mL | ORAL | Status: DC | PRN

## 2019-01-10 MED ORDER — HYDROXYZINE HCL 25 MG PO TABS: 25.0000 mg | ORAL_TABLET | Freq: Three times a day (TID) | ORAL | Status: DC | PRN

## 2019-01-10 MED ORDER — OLANZAPINE 5 MG PO TABS
5.0000 mg | ORAL_TABLET | Freq: Once | ORAL | Status: AC
  Administered 2019-01-10: 5 mg via ORAL
  Filled 2019-01-10: qty 1
  Filled 2019-01-10: qty 2

## 2019-01-10 MED ORDER — TRAZODONE HCL 50 MG PO TABS
50.0000 mg | ORAL_TABLET | Freq: Every evening | ORAL | Status: DC | PRN
  Administered 2019-01-10 – 2019-01-15 (×8): 50 mg via ORAL
  Filled 2019-01-10 (×17): qty 1

## 2019-01-10 MED ORDER — ACETAMINOPHEN 325 MG PO TABS: 650.0000 mg | ORAL_TABLET | Freq: Four times a day (QID) | ORAL | Status: DC | PRN

## 2019-01-10 NOTE — Tx Team (Signed)
Initial Treatment Plan 01/10/2019 9:56 PM [name] JKD:[id number]    PATIENT STRESSORS: Other: psychosis    PATIENT STRENGTHS: Communication skills Physical Health Supportive family/friends   PATIENT IDENTIFIED PROBLEMS: "I can't remember why I'm here"  "I need some medicine for voices"                   DISCHARGE CRITERIA:  Improved stabilization in mood, thinking, and/or behavior Need for constant or close observation no longer present Reduction of life-threatening or endangering symptoms to within safe limits  PRELIMINARY DISCHARGE PLAN: Return to previous living arrangement  PATIENT/FAMILY INVOLVEMENT: This treatment plan has been presented to and reviewed with the patient, [name].  The patient and family have been given the opportunity to ask questions and make suggestions.  Noel Christmas, RN 01/10/2019, 9:56 PM

## 2019-01-10 NOTE — ED Notes (Signed)
Lunch tray ordered 

## 2019-01-10 NOTE — Progress Notes (Addendum)
Patient admitted for psychosis and reported suicide attempts. Family concerned, he lives with family. Skin assessment significant for scars to R forearm. "I don't remember why I'm here". " I need medicine for voices". Endorses AH and SI. Denies HI, VH, and contracts for safety. Patient has history of methamphetamine use reports in TTS assessment he quit using meth as month ago. Patient oriented to the unit. He remains in his room except to ask for medication for voices and to get medication for AH and sleep as ordered by provider. Patient remains safe and will continue to monitor. Much of admission documentation obtained from chart.    TTS assessment: Justin Chaney is a 23 y.o. male who was brought to [facility] at the decision of himself and his family due to experiencing AH and VH. Pt has been IVCed. Pt shares he has been hearing voices coming from the walls, floor, ceiling, cosmos--everywhere--for several years and that over time the voices have gotten worse. He states these voices have been telling him to join an army--pt stated the type of army he was told to join but clinician could not hear/understand him despite asking him to repeat himself numerous times. Pt states he has also been seeing black spots and shadows, which is something that is a newer experience, and states this has also gotten worse, as he's now able to feel these things he sees.  Pt states he is not experiencing SI, though he does acknowledge he has attempted to kill himself 4 times previously and that the last attempt was "not too long ago." Pt states he has been hospitalized in the past, though he cannot state when or where; his records state his most recent hospitalization was several months ago in [place], where pt reported he was traveling to meet the devil but that he needed to stay somewhere safe so he didn't kill himself. Pt states the voices he hears are telling him to "destroy everything," though he has no plan. Pt denies HI.  Pt denies current/recent NSSIB, though he states that several years ago he engaged via cutting and cut "down to the bone," requiring 10 stitches. Pt denies engagement in the legal system, access to guns/weapons, or any use of EtOH/illegal substances.  When asked who pt has for support, pt answered "the spirits; they've been watching me ever since I was a child." Clinician inquired as to whether these were religious spirits or another kind and pt answered that these were not religious spirits but did not provide additional information.  Pt provided the name of his mother and her phone number to contact for collateral information; clinician called and left a HIPAA-compliant message requesting she return her phone call.  Pt is oriented x4. It was not possible to assess pt's memory. Pt was, overall, cooperative throughout the assessment process. Pt's insight, judgement, and impulse control is poor at this time.

## 2019-01-10 NOTE — ED Notes (Signed)
Pt denied snack at snack time.

## 2019-01-10 NOTE — ED Notes (Signed)
Pt declined lunch for sitter. Attempted to redo psych assessment pt states he "doesn't feel like talking right now."

## 2019-01-10 NOTE — ED Notes (Signed)
IVC paper served and faxed to [facility]; 3 copies on chart, copy sent to medical records and original in red folder-Monique,RN

## 2019-01-10 NOTE — ED Notes (Signed)
Pt not responding to psych assessment questions at this time, unclear whether pt is asleep or ignoring this RN

## 2019-01-10 NOTE — BH Assessment (Addendum)
Tele Assessment Note   Patient Name: Justin Chaney MRN: [id number] Referring Physician: Franchot Heidelberg, PA-C Location of Patient: [facility] Location of Provider: [place] is a 23 y.o. male who was brought to [facility] at the decision of himself and his family due to experiencing AH and VH. Pt has been IVCed. Pt shares he has been hearing voices coming from the walls, floor, ceiling, cosmos--everywhere--for several years and that over time the voices have gotten worse. He states these voices have been telling him to join an army--pt stated the type of army he was told to join but clinician could not hear/understand him despite asking him to repeat himself numerous times. Pt states he has also been seeing black spots and shadows, which is something that is a newer experience, and states this has also gotten worse, as he's now able to feel these things he sees.  Pt states he is not experiencing SI, though he does acknowledge he has attempted to kill himself 4 times previously and that the last attempt was "not too long ago." Pt states he has been hospitalized in the past, though he cannot state when or where; his records state his most recent hospitalization was several months ago in [place], where pt reported he was traveling to meet the devil but that he needed to stay somewhere safe so he didn't kill himself. Pt states the voices he hears are telling him to "destroy everything," though he has no plan. Pt denies HI. Pt denies current/recent NSSIB, though he states that several years ago he engaged via cutting and cut "down to the bone," requiring 10 stitches. Pt denies engagement in the legal system, access to guns/weapons, or any use of EtOH/illegal substances.  When asked who pt has for support, pt answered "the spirits; they've been watching me ever since I was a child." Clinician inquired as to whether these were religious spirits or another kind and pt  answered that these were not religious spirits but did not provide additional information.  Pt provided the name of his mother and her phone number to contact for collateral information; clinician called and left a HIPAA-compliant message requesting she return her phone call.  Pt is oriented x4. It was not possible to assess pt's memory. Pt was, overall, cooperative throughout the assessment process. Pt's insight, judgement, and impulse control is poor at this time.   Diagnosis: F20.9, Schizophrenia   Past Medical History:  Past Medical History:  Diagnosis Date  . Asthma   . Bipolar disorder ([place])   . 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 Depression   . Homelessness   . Schizophrenia ([place])   . Seizures ([place])    childhood seizures  . Suicidal behavior   . Suicidal intent     Past Surgical History:  Procedure Laterality Date  . APPENDECTOMY    . SHUNT REMOVAL    . TONSILLECTOMY    . VENTRICULO-PERITONEAL SHUNT PLACEMENT / LAPAROSCOPIC INSERTION PERITONEAL CATHETER      Family History:  Family History  Problem Relation Age of Onset  . Hypertension Mother   . Mental illness Neg Hx     Social History:  reports that he has been smoking cigars. He has been smoking about 0.00 packs per day. He has never used smokeless tobacco. He reports that he does not drink alcohol or use drugs.  Additional Social History:  Alcohol / Drug Use Pain Medications: Please see MAR Prescriptions:  Please see MAR Over the Counter: Please see MAR History of alcohol / drug use?: No history of alcohol / drug abuse Longest period of sobriety (when/how long): Pt denies SA  CIWA: CIWA-Ar BP: 135/81 Pulse Rate: (!) 102 COWS:    Allergies:  Allergies  Allergen Reactions  . Amoxicillin Hives and Other (See Comments)    CHILDHOOD ALLERGY Has patient had a PCN reaction causing immediate rash, facial/tongue/throat swelling, SOB or lightheadedness with hypotension: YES Has  patient had a PCN reaction causing severe rash involving mucus membranes or skin necrosis: No Has patient had a PCN reaction that required hospitalization No Has patient had a PCN reaction occurring within the last 10 years: No If all of the above answers are "NO", then may proceed with Cephalosporin use.  Marland Kitchen Penicillins Hives and Other (See Comments)    CHILDHOOD ALLERGY Has patient had a PCN reaction causing immediate rash, facial/tongue/throat swelling, SOB or lightheadedness with hypotension: No Has patient had a PCN reaction causing severe rash involving mucus membranes or skin necrosis: No Has patient had a PCN reaction that required hospitalization No Has patient had a PCN reaction occurring within the last 10 years: No If all of the above answers are "NO", then may proceed with Cephalosporin use.    Home Medications: (Not in a hospital admission)   OB/GYN Status:  No LMP for male patient.  General Assessment Data Assessment unable to be completed: Yes Reason for not completing assessment: Multiple assessments ordered simultaneously Location of Assessment: [facility] ED TTS Assessment: In system Is this a Tele or Face-to-Face Assessment?: Tele Assessment Is this an Initial Assessment or a Re-assessment for this encounter?: Initial Assessment Patient Accompanied by:: N/A Language Other than English: No Living Arrangements: Other (Comment)(Pt lives with family members) What gender do you identify as?: (UTA) Marital status: Single Maiden name: Kranz Pregnancy Status: No Living Arrangements: Parent, Other relatives Can pt return to current living arrangement?: Yes Admission Status: Involuntary Petitioner: Family member Is patient capable of signing voluntary admission?: Yes Referral Source: Self/Family/Friend Insurance type: Medicaid     Crisis Care Plan Living Arrangements: Parent, Other relatives Legal Guardian: Other:(UTA) Name of Psychiatrist: [place] Name of Therapist:  UTA  Education Status Is patient currently in school?: No Is the patient employed, unemployed or receiving disability?: Receiving disability income  Risk to self with the past 6 months Suicidal Ideation: No Has patient been a risk to self within the past 6 months prior to admission? : Yes Suicidal Intent: No Has patient had any suicidal intent within the past 6 months prior to admission? : Yes Is patient at risk for suicide?: No Suicidal Plan?: No Has patient had any suicidal plan within the past 6 months prior to admission? : Yes Access to Means: No What has been your use of drugs/alcohol within the last 12 months?: Pt denies SA Previous Attempts/Gestures: Yes How many times?: 4 Other Self Harm Risks: Pt is currently delusional Triggers for Past Attempts: Unknown Intentional Self Injurious Behavior: Cutting Comment - Self Injurious Behavior: Pt previously engaged in NSSIB via cutting(Pt states he cut himself to the bone requiring 10 stitches) Family Suicide History: Unknown Recent stressful life event(s): Other (Comment), Turmoil (Comment)(Pt is currently delusional) Persecutory voices/beliefs?: No Depression: No Depression Symptoms: Isolating Substance abuse history and/or treatment for substance abuse?: No Suicide prevention information given to non-admitted patients: Not applicable  Risk to Others within the past 6 months Homicidal Ideation: No Does patient have any lifetime risk of violence toward others  beyond the six months prior to admission? : No Thoughts of Harm to Others: No Current Homicidal Intent: No Current Homicidal Plan: No Access to Homicidal Means: No Identified Victim: None noted History of harm to others?: No Assessment of Violence: On admission Violent Behavior Description: None noted Does patient have access to weapons?: No(Pt denies access to guns/weapons) Criminal Charges Pending?: No Does patient have a court date: No Is patient on probation?:  No  Psychosis Hallucinations: Auditory, Tactile, Visual, With command Delusions: Grandiose  Mental Status Report Appearance/Hygiene: In scrubs Eye Contact: Other (Comment)(Pt stares at the ceiling throughout the assessment) Motor Activity: Freedom of movement, Other (Comment)(Pt is lying on his hospital bed) Speech: Soft, Slow Level of Consciousness: Quiet/awake Mood: Preoccupied Affect: Flat Anxiety Level: Minimal Thought Processes: Circumstantial Judgement: Impaired Orientation: Person, Place, Time, Situation Obsessive Compulsive Thoughts/Behaviors: Severe  Cognitive Functioning Concentration: Fair Memory: Unable to Assess Is patient IDD: No Insight: Unable to Assess Impulse Control: Unable to Assess Appetite: (UTA - pt states he has "no appetite for food") Have you had any weight changes? : No Change Sleep: Decreased Total Hours of Sleep: 1(Pt states he has had "no sleep in almost 2 weeks") Vegetative Symptoms: None  ADLScreening [facility] Assessment Services) Patient's cognitive ability adequate to safely complete daily activities?: (UTA) Patient able to express need for assistance with ADLs?: (UTA) Independently performs ADLs?: (UTA)  Prior Inpatient Therapy Prior Inpatient Therapy: Yes Prior Therapy Dates: Multiple Prior Therapy Facilty/Provider(s): Most recent hospitalization was in [place] Reason for Treatment: MH  Prior Outpatient Therapy Prior Outpatient Therapy: (Unknown)  ADL Screening (condition at time of admission) Patient's cognitive ability adequate to safely complete daily activities?: (UTA) Is the patient deaf or have difficulty hearing?: (UTA) Does the patient have difficulty seeing, even when wearing glasses/contacts?: (UTA) Does the patient have difficulty concentrating, remembering, or making decisions?: (UTA) Patient able to express need for assistance with ADLs?: (UTA) Does the patient have difficulty dressing or bathing?:  (UTA) Independently performs ADLs?: (UTA) Does the patient have difficulty walking or climbing stairs?: (UTA) Weakness of Legs: (UTA) Weakness of Arms/Hands: (UTA)  Home Assistive Devices/Equipment Home Assistive Devices/Equipment: (UTA)  Therapy Consults (therapy consults require a physician order) PT Evaluation Needed: (UTA) OT Evalulation Needed: (UTA) SLP Evaluation Needed: (UTA) Abuse/Neglect Assessment (Assessment to be complete while patient is alone) Abuse/Neglect Assessment Can Be Completed: Unable to assess, patient is non-responsive or altered mental status Values / Beliefs Cultural Requests During Hospitalization: (UTA) Spiritual Requests During Hospitalization: (UTA) Consults Spiritual Care Consult Needed: (UTA) Social Work Consult Needed: Special educational needs teacher) Regulatory affairs officer (For Healthcare) Does Patient Have a Medical Advance Directive?: Unable to assess, patient is non-responsive or altered mental status          Disposition: [name], NP, reviewed pt's chart and information and determined pt meets criteria for inpatient hospitalization. There are currently no appropriate beds for pt at [facility], so pt's referral information will be faxed out to multiple hospitals for potential placement. This information was provided to pt's nurse, Flossie Dibble, at [phone].   Disposition Initial Assessment Completed for this Encounter: Yes Patient referred to: Other (Comment)(Pt's referral info will be faxed to multiple hospitals)  This service was provided via telemedicine using a 2-way, interactive audio and video technology.  Names of all persons participating in this telemedicine service and their role in this encounter. Name: [name] Role: Patient  Name: [name] Role: Nurse Practitioner  Name: Windell Hummingbird Role: Clinician    Dannielle Burn  01/10/2019 3:06 AM

## 2019-01-10 NOTE — ED Provider Notes (Signed)
  Physical Exam  BP 135/81 (BP Location: Left Arm)   Pulse (!) 102   Temp 98.6 F (37 C) (Oral)   Resp 16   SpO2 98%   Physical Exam HENT:     Head: Normocephalic.  Eyes:     General: Lids are normal.  Neck:     Musculoskeletal: Normal range of motion.  Cardiovascular:     Rate and Rhythm: Normal rate.  Pulmonary:     Effort: Pulmonary effort is normal. No respiratory distress.  Musculoskeletal: Normal range of motion.  Neurological:     Mental Status: He is alert.  Psychiatric:     Comments: Awake moving around in bed. No distress.      ED Course/Procedures     Procedures  MDM   0845: Pt evaluated. NAD. VS WNL, will update this morning. ER work up reviewed and unremarkable. Med cleared 6/9. Psych evaluated on 6/9 recommending inpatient treatment, pending placement by [facility]. COVID negative.        Kinnie Feil, PA-C 01/10/19 0712    Blanchie Dessert, MD 01/12/19 2117

## 2019-01-10 NOTE — Progress Notes (Signed)
Pt accepted to [facility], Bed 502-2 [name], NP is the accepting provider.  [name], MD is the attending provider.  Call report to  [phone]  [facility] Psych ED notified.   Pt is IVC. IVC TO BE FAXED TO [facility].  Pt may be transported by [facility] Pt scheduled  to arrive at [place].  [place], [place], RN, WILL CONTACT ED.  Areatha Keas. Judi Cong, MSW, [place] Disposition Clinical Social Work [phone] (cell) [phone] (office)

## 2019-01-11 DIAGNOSIS — F250 Schizoaffective disorder, bipolar type: Secondary | ICD-10-CM

## 2019-01-11 LAB — NOVEL CORONAVIRUS, NAA (HOSP ORDER, SEND-OUT TO REF LAB; TAT 18-24 HRS): SARS-CoV-2, NAA: NOT DETECTED

## 2019-01-11 MED ORDER — LORAZEPAM 1 MG PO TABS: 2.0000 mg | ORAL_TABLET | ORAL | Status: DC | PRN

## 2019-01-11 MED ORDER — BENZTROPINE MESYLATE 1 MG PO TABS
1.0000 mg | ORAL_TABLET | Freq: Two times a day (BID) | ORAL | Status: DC
  Administered 2019-01-11 – 2019-01-16 (×10): 1 mg via ORAL
  Filled 2019-01-11 (×7): qty 1
  Filled 2019-01-11: qty 14
  Filled 2019-01-11 (×4): qty 1
  Filled 2019-01-11: qty 14
  Filled 2019-01-11 (×3): qty 1

## 2019-01-11 MED ORDER — HALOPERIDOL 5 MG PO TABS
5.0000 mg | ORAL_TABLET | Freq: Every day | ORAL | Status: DC
  Administered 2019-01-11 – 2019-01-16 (×6): 5 mg via ORAL
  Filled 2019-01-11 (×8): qty 1

## 2019-01-11 MED ORDER — LITHIUM CARBONATE ER 300 MG PO TBCR
300.0000 mg | EXTENDED_RELEASE_TABLET | Freq: Two times a day (BID) | ORAL | Status: DC
  Administered 2019-01-11 – 2019-01-16 (×11): 300 mg via ORAL
  Filled 2019-01-11 (×5): qty 1
  Filled 2019-01-11: qty 14
  Filled 2019-01-11 (×6): qty 1
  Filled 2019-01-11: qty 14
  Filled 2019-01-11 (×4): qty 1

## 2019-01-11 MED ORDER — TEMAZEPAM 30 MG PO CAPS
30.0000 mg | ORAL_CAPSULE | Freq: Every day | ORAL | Status: DC
  Administered 2019-01-11 – 2019-01-15 (×5): 30 mg via ORAL
  Filled 2019-01-11 (×3): qty 1

## 2019-01-11 MED ORDER — LORAZEPAM 2 MG/ML IJ SOLN: 2.0000 mg | INTRAMUSCULAR | Status: DC | PRN

## 2019-01-11 MED ORDER — HALOPERIDOL 5 MG PO TABS
15.0000 mg | ORAL_TABLET | Freq: Every day | ORAL | Status: DC
  Administered 2019-01-11 – 2019-01-14 (×4): 15 mg via ORAL
  Filled 2019-01-11 (×5): qty 3

## 2019-01-11 NOTE — Progress Notes (Signed)
Recreation Therapy Notes  INPATIENT RECREATION THERAPY ASSESSMENT  Patient Details Name: Justin Chaney MRN: [id number] DOB: 1996/04/06 Today's Date: 01/11/2019  Comments:  Patient is experiencing psychosis and hearing voices such as the devil. Patient recently went to [place] to meet the devil. Patient has a hx of self injurious behavior, and 4 previous suicide attempts even though he denies  current SI. Patient has a hx of substance use.       Information Obtained From: Chart Review  Reason for Admission (Per Patient): Active Symptoms("I cant remember why I am here")  Patient Stressors: ("psychosis")  [place] of Residence:  Investment banker, corporate- homeless  Patient Strengths:  "Armed forces logistics/support/administrative officer, physical health, supportive friends and family"  Patient Identified Areas of Improvement:  "i cant remember why I am here, I need some medicine for voices"  Current SI (including self-harm):  No  Current HI:  No  Current AVH: Yes  Staff Intervention Plan: Group Attendance, Collaborate with Interdisciplinary Treatment Team  Consent to Intern Participation: N/A  Tomi Likens, LRT/CTRS  [place] 01/11/2019, 3:33 PM

## 2019-01-11 NOTE — BHH Suicide Risk Assessment (Signed)
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  World Pharmacologist [facility]). Score between 0-7:  no or low risk or alcohol related problems. Score between 8-15:  moderate risk of alcohol related problems. Score between 16-19:  high risk of alcohol related problems. Score 20 or above:  warrants further diagnostic evaluation for alcohol dependence and treatment.   CLINICAL FACTORS:   Schizophrenia:   Command hallucinatons   Musculoskeletal: Strength & Muscle Tone: within normal limits Gait & Station: normal Patient leans: N/A  Psychiatric Specialty Exam: Physical Exam  ROS  Blood pressure 117/79, pulse 100, temperature 98.6 F (37 C), temperature source Oral, resp. rate 18, height 5\' 2"  (1.575 m), weight 103.4 kg, SpO2 100 %.Body mass index is 41.7 kg/m.  General Appearance: Casual  Eye Contact:  Minimal  Speech:  Pressured  Volume:  Decreased  Mood:  Dysphoric  Affect:  Congruent and Constricted  Thought Process:  Irrelevant, Linear and Descriptions of Associations: Loose  Orientation:  Full (Time, Place, and Person)  Thought Content:  Illogical, Delusions and Hallucinations: Auditory  Suicidal  Thoughts:  Yes.  without intent/plan  Homicidal Thoughts:  No  Memory:  Immediate;   Fair  Judgement:  Fair  Insight:  Fair  Psychomotor Activity:  Normal  Concentration:  Concentration: Fair  Recall:  [employer] of Knowledge:  Fair  Language:  Fair  Akathisia:  Negative  Handed:  Right  AIMS (if indicated):     Assets:  Resilience Social Support  ADL's:  Intact  Cognition:  WNL  Sleep:  Number of Hours: 6.75      COGNITIVE FEATURES THAT CONTRIBUTE TO RISK:  Loss of executive function    SUICIDE RISK:   Mild:  Suicidal ideation of limited frequency, intensity, duration, and specificity.  There are no identifiable plans, no associated intent, mild dysphoria and related symptoms, good self-control (both objective and subjective assessment), few other risk factors, and identifiable protective factors, including available and accessible social support.  PLAN OF CARE: Admit for stabilization see orders  I certify that inpatient services furnished can reasonably be expected to improve the patient's condition.   [name], MD 01/11/2019, 11:30 AM

## 2019-01-11 NOTE — Plan of Care (Signed)
Progress note  D: pt found in bed; compliant with medication administration. Pt is angry and ambivalent with his care. Pt states he doesn't want to be called [place]. Pt has been in bed most of the day. Pt denies any physical pain or problems. Pt denies si/hi/ah/vh and verbally agrees to approach staff if these become apparent or before harming himself/others while at [facility]. A: pt provided support and encouragement. Pt given medication per protocol and standing orders. Q27m safety checks implemented and continued. R: pt sfae on the unit; will continue to monitor.  Pt progressing in the following metrics  Problem: Education: Goal: Knowledge of [place] [facility] Education information/materials will improve Outcome: Progressing Goal: Emotional status will improve Outcome: Progressing Goal: Verbalization of understanding the information provided will improve Outcome: Progressing   Problem: Activity: Goal: Sleeping patterns will improve Outcome: Progressing

## 2019-01-11 NOTE — Progress Notes (Signed)
NUTRITION ASSESSMENT  Pt identified as at risk on the Malnutrition Screen Tool  INTERVENTION:  Supplements: Ensure Enlive po BID, each supplement provides 350 kcal and 20 grams of protein  NUTRITION DIAGNOSIS: Unintentional weight loss related to sub-optimal intake as evidenced by pt report.   Goal: Pt to meet >/= 90% of their estimated nutrition needs.  Monitor:  PO intake  Assessment:   Patient with PMH significant for asthma, bipolar disorder, depression, schizophrenia, suicidal behaviors, and seizures. Presents this admission with psychosis and reported suicide attempt.   Psychiatric assessment notes pt "has no appetite for food." No meal completions charted this admission. Weight noted to fluctuate throughout the year. RD to provide supplements to maximize kcal and protein this admission.   23 y.o. male  Height: Ht Readings from Last 1 Encounters:  01/10/19 5\' 2"  (1.575 m)    Weight: Wt Readings from Last 1 Encounters:  01/10/19 103.4 kg    Weight Hx: Wt Readings from Last 10 Encounters:  01/10/19 103.4 kg  01/03/18 99.8 kg  12/11/17 106.6 kg  06/25/17 107 kg  06/22/17 107 kg  01/12/17 110.7 kg  12/21/16 107 kg  11/18/15 99.8 kg  11/16/15 103.4 kg  11/06/15 103 kg    BMI:  Body mass index is 41.7 kg/m. Pt meets criteria for morbid obesity based on current BMI.  Estimated Nutritional Needs: Kcal: 25-30 kcal/kg Protein: > 1 gram protein/kg Fluid: 1 ml/kcal  Diet Order:  Diet Order            Diet regular Room service appropriate? No; Fluid consistency: Thin; Fluid restriction: 2000 mL Fluid  Diet effective now             Pt is also offered choice of unit snacks mid-morning and mid-afternoon.  Pt is eating as desired.   Lab results and medications reviewed.   Mariana Single RD, LDN Clinical Nutrition Pager # [phone]

## 2019-01-11 NOTE — H&P (Signed)
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 Diagnosis:  Active Problems:   Schizoaffective disorder ([place])  History of Present Illness:  This is the first admission here but the second this year for Justin Chaney who is 23 years of age and probably suffering from a paranoid schizophrenic condition although past prescriptions for lithium and depressive complaints seem to indicate that other clinicians trended towards schizoaffective condition-at any rate the patient is quite symptomatic he was delusional he had recently gone to [place] in order to meet the devil and further he has been having extensive auditory hallucinations coming from the floor the ceiling so forth telling him to kill himself and he also reports seeing spots that are described as black and floating and nearly continually seen. What we do know is that he has a history of being diagnosed with a psychotic disorder treated with lithium and oral Abilify and compliance is unknown he also has a history of cannabis abuse versus dependency and methamphetamine abuse.  He also has a history of self-harm gestures and states he is attempted to kill himself 4 times by cutting himself or overdosing at one point endorsing a very severe laceration of the arm requiring 10 stitches. We do not have collateral history at this point in time but the assessments are consistent. Drug screen negative on this admission alcohol level negligible.  When I interview him he is alert he makes no eye contact he talks about seeing spots and hearing voices "coming from all over" he denies wanting to harm himself now and can contract for safety but reports recently has had these command hallucinations.  He understands what it is to contract.  No involuntary movements.  According to  the assessment team again all the histories do seem consistent- assessment: Justin Barnhillis a 23 y.o.malewho was brought to [facility] at the decision of himself and his family due to experiencing AH and VH. Pt has been IVCed. Pt shares he has been hearing voices coming from the walls, floor, ceiling, cosmos--everywhere--for several years and that over time the voices have gotten worse. He states these voices have been telling him to join an army--pt stated the type of army he was told to join but clinician could not hear/understand him despite asking him to repeat himself numerous times. Pt states he has also been seeing black spots and shadows, which is something that is a newer experience, and states this has also gotten worse, as he's now able to feel these things he sees.  Pt states he is not experiencing SI, though he does acknowledge he has attempted to kill himself 4 times previously and that the last attempt was "not too long ago." Pt states he has been hospitalized in the past, though he cannot state when or where; his records state his most recent hospitalization was several months ago in [place], where pt reported he was traveling to meet the devil but that he needed to stay somewhere safe so he didn't kill himself. Pt states the voices he hears are telling him to "destroy everything," though he has no plan. Pt denies HI. Pt denies current/recent NSSIB, though he states that several years ago he engaged via cutting and cut "down to the bone," requiring 10 stitches. Pt denies engagement in the legal system, access to guns/weapons, or any use of EtOH/illegal substances.  When asked who pt has for support,  pt answered "the spirits; they've been watching me ever since I was a child." Clinician inquired as to whether these were religious spirits or another kind and pt answered that these were not religious spirits but did not provide additional information.  Pt provided the name of his mother and  her phone number to contact for collateral information; clinician called and left a HIPAA-compliant message requesting she return her phone call.  Pt is oriented x4. It was not possible to assess pt's memory. Pt was, overall, cooperative throughout the assessment process. Pt's insight, judgement, and impulse control is poor at this time. 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 Yes.    Has the patient been a risk to self in the past 6 months? Yes.    Has the patient been a risk to self within the distant past? Yes.    Is the patient a risk to others? No.  Has the patient been a risk to others in the past 6 months? No.  Has the patient been a risk to others within the distant past? No.   Alcohol Screening: 1. How often do you have a drink containing alcohol?: Never 2. How many drinks containing alcohol do you have on a typical day when you are drinking?: 1 or 2 3. How often do you have six or more drinks on one occasion?: Never AUDIT-C Score: 0 9. Have you or someone else been injured as a result of your drinking?: No 10. Has a relative or friend or a doctor or another health worker been concerned about your drinking or suggested you cut down?: No Alcohol Use Disorder Identification Test Final Score (AUDIT): 0 Alcohol Brief Interventions/Follow-up: AUDIT Score <7 follow-up not indicated Substance Abuse History in the last 12 months:  Yes.   Consequences of Substance Abuse: NA Previous Psychotropic Medications: Yes  Psychological Evaluations: No  Past Medical History:  Past Medical  History:  Diagnosis Date  . Asthma   . Bipolar disorder (Justin Chaney)   . 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 Depression   . Homelessness   . Schizophrenia ([place])   . Seizures ([place])    childhood seizures  . Suicidal behavior   . Suicidal intent     Past Surgical History:  Procedure Laterality Date  . APPENDECTOMY    . SHUNT REMOVAL    . TONSILLECTOMY    . VENTRICULO-PERITONEAL SHUNT PLACEMENT / LAPAROSCOPIC INSERTION PERITONEAL CATHETER     Family History:  Family History  Problem Relation Age of Onset  . Hypertension Mother   . Mental illness Neg Hx    Tobacco Screening: Have you used any form of tobacco in the last 30 days? (Cigarettes, Smokeless Tobacco, Cigars, and/or Pipes): Patient Refused Screening Social History:  Social History   Substance and Sexual Activity  Alcohol Use No     Social History   Substance and Sexual Activity  Drug Use No  . Types: Other-see comments, Amphetamines   Comment: History of use    Additional Social History:  Allergies:   Allergies  Allergen Reactions  . Amoxicillin Hives and Other (See Comments)    CHILDHOOD ALLERGY Has patient had a PCN reaction causing immediate rash, facial/tongue/throat swelling, SOB or lightheadedness with hypotension: YES Has patient had a PCN reaction causing severe rash involving mucus membranes or skin necrosis: No Has patient had a PCN reaction that required hospitalization No Has patient had a PCN reaction occurring within the last 10 years: No If all of the above answers are "NO", then may proceed with Cephalosporin use.  Marland Kitchen Penicillins Hives and Other (See Comments)    CHILDHOOD ALLERGY Has patient had a PCN reaction causing immediate rash, facial/tongue/throat swelling, SOB or lightheadedness with hypotension: No Has patient had a PCN reaction causing severe rash involving mucus membranes or skin necrosis: No Has patient had a PCN reaction  that required hospitalization No Has patient had a PCN reaction occurring within the last 10 years: No If all of the above answers are "NO", then may proceed with Cephalosporin use.   Lab Results:  Results for orders placed or performed during the hospital encounter of 01/09/19 (from the past 48 hour(s))  Comprehensive metabolic panel     Status: Abnormal   Collection Time: 01/09/19 11:01 PM  Result Value Ref Range   Sodium 135 135 - 145 mmol/L   Potassium 3.4 (L) 3.5 - 5.1 mmol/L   Chloride 101 98 - 111 mmol/L   CO2 23 22 - 32 mmol/L   Glucose, Bld 101 (H) 70 - 99 mg/dL   BUN 10 6 - 20 mg/dL   Creatinine, Ser 0.82 0.61 - 1.24 mg/dL   Calcium 9.2 8.9 - 10.3 mg/dL   Total Protein 7.1 6.5 - 8.1 g/dL   Albumin 4.2 3.5 - 5.0 g/dL   AST 29 15 - 41 U/L   ALT 28 0 - 44 U/L   Alkaline Phosphatase 69 38 - 126 U/L   Total Bilirubin 0.7 0.3 - 1.2 mg/dL   GFR calc non Af Amer >60 >60 mL/min   GFR calc Af Amer >60 >60 mL/min   Anion gap 11 5 - 15    Comment: Performed at [facility], [address], [place], [place] [address]  Ethanol     Status: None   Collection Time: 01/09/19 11:01 PM  Result Value Ref Range   Alcohol, Ethyl (B) <10 <10 mg/dL    Comment: (NOTE) Lowest detectable limit for serum alcohol is 10 mg/dL. For medical purposes only. Performed at [facility], [place] [address], [place], [place] [address]   Salicylate level     Status: None   Collection Time: 01/09/19 11:01 PM  Result Value Ref Range   Salicylate Lvl <8.2 2.8 - 30.0 mg/dL    Comment: Performed at [facility] [address], [place], [place] [address]  Acetaminophen level     Status: Abnormal   Collection Time: 01/09/19 11:01 PM  Result Value Ref Range   Acetaminophen (Tylenol), Serum <10 (L) 10 - 30 ug/mL    Comment: (NOTE) Therapeutic concentrations vary significantly. A range of 10-30 ug/mL  may be an effective concentration for many patients. However, some  are best treated at  concentrations outside of this range. Acetaminophen concentrations >150 ug/mL at 4 hours after ingestion  and >50 ug/mL at 12 hours after ingestion are often associated with  toxic reactions. Performed at [facility], [place] [address] [place], [place] [address]   cbc  Status: None   Collection Time: 01/09/19 11:01 PM  Result Value Ref Range   WBC 7.5 4.0 - 10.5 K/uL   RBC 5.11 4.22 - 5.81 MIL/uL   Hemoglobin 14.5 13.0 - 17.0 g/dL   HCT 43.4 39.0 - 52.0 %   MCV 84.9 80.0 - 100.0 fL   MCH 28.4 26.0 - 34.0 pg   MCHC 33.4 30.0 - 36.0 g/dL   RDW 11.5 11.5 - 15.5 %   Platelets 285 150 - 400 K/uL   nRBC 0.0 0.0 - 0.2 %    Comment: Performed at [facility], [place] [address], [place], [place] [address]  Rapid urine drug screen (hospital performed)     Status: None   Collection Time: 01/09/19 11:04 PM  Result Value Ref Range   Opiates NONE DETECTED NONE DETECTED   Cocaine NONE DETECTED NONE DETECTED   Benzodiazepines NONE DETECTED NONE DETECTED   Amphetamines NONE DETECTED NONE DETECTED   Tetrahydrocannabinol NONE DETECTED NONE DETECTED   Barbiturates NONE DETECTED NONE DETECTED    Comment: (NOTE) DRUG SCREEN FOR MEDICAL PURPOSES ONLY.  IF CONFIRMATION IS NEEDED FOR ANY PURPOSE, NOTIFY LAB WITHIN 5 DAYS. LOWEST DETECTABLE LIMITS FOR URINE DRUG SCREEN Drug Class                     Cutoff (ng/mL) Amphetamine and metabolites    1000 Barbiturate and metabolites    200 Benzodiazepine                 427 Tricyclics and metabolites     300 Opiates and metabolites        300 Cocaine and metabolites        300 THC                            50 Performed at [facility], [place] [address] [place], [place] [address]   Novel Coronavirus,NAA,(SEND-OUT TO REF LAB - TAT 24-48 hrs); Hosp Order     Status: None   Collection Time: 01/10/19  1:36 AM   Specimen: Nasopharyngeal Swab; Respiratory  Result Value Ref Range   SARS-CoV-2, NAA NOT DETECTED NOT DETECTED    Comment:  (NOTE) This test was developed and its performance characteristics determined by [employer]. This test has not been FDA cleared or approved. This test has been authorized by FDA under an Emergency Use Authorization (EUA). This test is only authorized for the duration of time the declaration that circumstances exist justifying the authorization of the emergency use of in vitro diagnostic tests for detection of SARS-CoV-2 virus and/or diagnosis of COVID-19 infection under section 564(b)(1) of the Act, 21 U.S.C. 628BTD-1(V)(6), unless the authorization is terminated or revoked sooner. When diagnostic testing is negative, the possibility of a false negative result should be considered in the context of a patient's recent exposures and the presence of clinical signs and symptoms consistent with COVID-19. An individual without symptoms of COVID-19 and who is not shedding SARS-CoV-2 virus would expect to have a negative (not detected) result in this assay. Performed  At: [facility] [place], [place] [id number] Rush Farmer MD YI:[id number]    Coronavirus Source NASOPHARYNGEAL     Comment: Performed at [facility], [place] [address], [place], [place] [address]    Blood Alcohol level:  Lab Results  Component Value Date   ETH <10 01/09/2019   ETH <10 08/01/2018  Metabolic Disorder Labs:  Lab Results  Component Value Date   HGBA1C 5.0 12/22/2016   MPG 97 12/22/2016   MPG 97 09/09/2015   Lab Results  Component Value Date   PROLACTIN 2.4 (L) 12/22/2016   PROLACTIN 8.0 09/09/2015   Lab Results  Component Value Date   CHOL 130 12/22/2016   TRIG 193 (H) 12/22/2016   HDL 35 (L) 12/22/2016   CHOLHDL 3.7 12/22/2016   VLDL 39 12/22/2016   LDLCALC 56 12/22/2016   LDLCALC 84 09/09/2015    Current Medications: Current Facility-Administered Medications  Medication Dose Route Frequency Provider Last Rate Last Dose  . acetaminophen (TYLENOL)  tablet 650 mg  650 mg Oral Q6H PRN Mordecai Maes, NP      . alum & mag hydroxide-simeth (MAALOX/MYLANTA) 200-200-20 MG/5ML suspension 30 mL  30 mL Oral Q4H PRN Mordecai Maes, NP      . benztropine (COGENTIN) tablet 1 mg  1 mg Oral BID [name], MD      . haloperidol (HALDOL) tablet 15 mg  15 mg Oral QHS [name], MD      . haloperidol (HALDOL) tablet 5 mg  5 mg Oral Daily [name], MD      . hydrOXYzine (ATARAX/VISTARIL) tablet 25 mg  25 mg Oral TID PRN [name] A, NP      . lithium carbonate (LITHOBID) CR tablet 300 mg  300 mg Oral Q12H [name], MD      . LORazepam (ATIVAN) tablet 2 mg  2 mg Oral Q4H PRN [name], MD       Or  . LORazepam (ATIVAN) injection 2 mg  2 mg Intramuscular Q4H PRN [name], MD      . temazepam (RESTORIL) capsule 30 mg  30 mg Oral QHS [name], MD      . traZODone (DESYREL) tablet 50 mg  50 mg Oral QHS,MR X 1 [name] A, NP   50 mg at 01/10/19 2126   PTA Medications: Medications Prior to Admission  Medication Sig Dispense Refill Last Dose  . acetaminophen (TYLENOL) 325 MG tablet Take 2 tablets (650 mg total) by mouth every 6 (six) hours as needed. (Patient not taking: Reported on 08/01/2018) 56 tablet 0   . ARIPiprazole (ABILIFY) 10 MG tablet Take 1 tablet (10 mg total) by mouth at bedtime. For depression (Patient not taking: Reported on 01/10/2019) 30 tablet 0   . hydrOXYzine (ATARAX/VISTARIL) 25 MG tablet Take 1 tablet (25 mg total) by mouth 3 (three) times daily as needed for anxiety. (Patient not taking: Reported on 08/01/2018) 60 tablet 0   . lithium carbonate (LITHOBID) 300 MG CR tablet Take 300 mg by mouth 2 (two) times daily.   1   . lithium carbonate 600 MG capsule Take 1 capsule (600 mgs) in the morning and at supper: For mood stabilization (Patient not taking: Reported on 08/18/2018) 60 capsule 0   . nicotine (NICODERM CQ - DOSED IN MG/24 HOURS) 21 mg/24hr patch Place 1 patch (21 mg total) onto the skin daily. For  smoking ceasation (Patient not taking: Reported on 03/08/2018) 28 patch 0   . traZODone (DESYREL) 50 MG tablet Take 1 tablet (50 mg total) by mouth at bedtime as needed for sleep. (Patient not taking: Reported on 08/01/2018) 30 tablet 0      Musculoskeletal: Strength & Muscle Tone: within normal limits Gait & Station: normal Patient leans: N/A  Psychiatric Specialty Exam: Physical Exam  ROS  Blood pressure 117/79, pulse  100, temperature 98.6 F (37 C), temperature source Oral, resp. rate 18, height 5\' 2"  (1.575 m), weight 103.4 kg, SpO2 100 %.Body mass index is 41.7 kg/m.  General Appearance: Casual  Eye Contact:  Minimal  Speech:  Pressured  Volume:  Decreased  Mood:  Dysphoric  Affect:  Congruent and Constricted  Thought Process:  Irrelevant, Linear and Descriptions of Associations: Loose  Orientation:  Full (Time, Place, and Person)  Thought Content:  Illogical, Delusions and Hallucinations: Auditory  Suicidal Thoughts:  Yes.  without intent/plan  Homicidal Thoughts:  No  Memory:  Immediate;   Fair  Judgement:  Fair  Insight:  Fair  Psychomotor Activity:  Normal  Concentration:  Concentration: Fair  Recall:  [employer] of Knowledge:  Fair  Language:  Fair  Akathisia:  Negative  Handed:  Right  AIMS (if indicated):     Assets:  Resilience Social Support  ADL's:  Intact  Cognition:  WNL  Sleep:  Number of Hours: 6.75      Treatment Plan Summary: Daily contact with patient to assess and evaluate symptoms and progress in treatment and Medication management  Observation Level/Precautions:  15 minute checks  Laboratory:  UDS  Psychotherapy:  Reality based  Medications:  Several additions  Consultations:  n/a  Discharge Concerns:  Long term stability  Estimated LOS: 7 - 10 days  Other:  I. Schizophrenia H/o substance abuse   Physician Treatment Plan for Primary Diagnosis: <principal problem not specified> Long Term Goal(s): Improvement in symptoms so as  ready for discharge  Short Term Goals: Ability to demonstrate self-control will improve and Ability to identify and develop effective coping behaviors will improve  Physician Treatment Plan for Secondary Diagnosis: Active Problems:   Schizoaffective disorder ([place])  Long Term Goal(s): Improvement in symptoms so as ready for discharge  Short Term Goals: Ability to maintain clinical measurements within normal limits will improve and Compliance with prescribed medications will improve  I certify that inpatient services furnished can reasonably be expected to improve the patient's condition.    [name], MD 6/11/202011:32 AM

## 2019-01-11 NOTE — Progress Notes (Signed)
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 Patient will follow directions on first prompt.  Behavioral Response: Appropriate  Intervention: Worksheet  Activity:  Staff on [employer] were provided with a worksheet on [employer], Strengths and Qualities. Staff was instructed to give it to the patients and have them work on it in place of [place]. Staff was also given 2 coloring sheets and one word search and were given the option to give them out.  Education:  Ability to follow Directions, Change of thought processes Discharge Planning.   Education Outcome: Acknowledges education/In group clarification offered  Clinical Observations/Feedback: . Due to COVID-19, guidelines group was not held. Group members were provided a learning activity worksheet to work on the topic and above-stated goals. LRT is available to answer any questions patient may have regarding the worksheet.  Tomi Likens, LRT/CTRS         Joffre Lucks L Krystalle Pilkington 01/11/2019 11:55 AM

## 2019-01-12 DIAGNOSIS — F259 Schizoaffective disorder, unspecified: Principal | ICD-10-CM

## 2019-01-12 NOTE — Progress Notes (Signed)
Louis observe laying in bed with eyes awake. He denies SI/HI/AVH at present. Pt was guarded with interaction; stays isolative to room. Appears to be responding to internal stimuli. Support offered. Will continue with POC.

## 2019-01-12 NOTE — Progress Notes (Addendum)
[facility] MD Progress Note  01/12/2019 12:35 PM Justin Chaney  MRN:  [id number]   Subjective:  Justin Chaney " I just want to be left alone, I don't want anything to eat either."  Evaluation: Justin Chaney seen resting in bed with blankets pulled over his head. Patient denied any concerns, as he stated he doesn't want to answers any questions at this time. Chart was reviewed, patient is irritable, angry and guarded since his admissions. Patient is isolative to his room.  NP will attempt to reassess and rely assessment to attending Psychiatrist. Support, encouragement and reassessment was provided.    Principal Problem: Schizoaffective disorder ([place]) Diagnosis: Principal Problem:   Schizoaffective disorder ([place])  Total Time spent with patient: 15 minutes  Past Psychiatric History:   Past Medical History:  Past Medical History:  Diagnosis Date  . Asthma   . Bipolar disorder ([place])   . 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 Depression   . Homelessness   . Schizophrenia ([place])   . Seizures ([place])    childhood seizures  . Suicidal behavior   . Suicidal intent     Past Surgical History:  Procedure Laterality Date  . APPENDECTOMY    . SHUNT REMOVAL    . TONSILLECTOMY    . VENTRICULO-PERITONEAL SHUNT PLACEMENT / LAPAROSCOPIC INSERTION PERITONEAL CATHETER     Family History:  Family History  Problem Relation Age of Onset  . Hypertension Mother   . Mental illness Neg Hx    Family Psychiatric  History:  Social History:  Social History   Substance and Sexual Activity  Alcohol Use No     Social History   Substance and Sexual Activity  Drug Use No  . Types: Other-see comments, Amphetamines   Comment: History of use    Social History   Socioeconomic History  . Marital status: Single    Spouse name: Not on file  . Number of children: Not on file  . Years of education: Not on file  . Highest education level: Not on file  Occupational History  . Not on file   Social Needs  . Financial resource strain: Not on file  . Food insecurity    Worry: Not on file    Inability: Not on file  . Transportation needs    Medical: Not on file    Non-medical: Not on file  Tobacco Use  . Smoking status: Current Every Day Smoker    Packs/day: 0.00    Types: Cigars  . Smokeless tobacco: Never Used  Substance and Sexual Activity  . Alcohol use: No  . Drug use: No    Types: Other-see comments, Amphetamines    Comment: History of use  . Sexual activity: Not on file  Lifestyle  . Physical activity    Days per week: Not on file    Minutes per session: Not on file  . Stress: Not on file  Relationships  . Social Herbalist on phone: Not on file    Gets together: Not on file    Attends religious service: Not on file    Active member of club or organization: Not on file    Attends meetings of clubs or organizations: Not on file    Relationship status: Not on file  Other Topics Concern  . Not on file  Social History Narrative  . Not on file   Additional Social History:  Sleep: NA  Appetite:  NA  Current Medications: Current Facility-Administered Medications  Medication Dose Route Frequency Provider Last Rate Last Dose  . acetaminophen (TYLENOL) tablet 650 mg  650 mg Oral Q6H PRN Mordecai Maes, NP      . alum & mag hydroxide-simeth (MAALOX/MYLANTA) 200-200-20 MG/5ML suspension 30 mL  30 mL Oral Q4H PRN Mordecai Maes, NP      . benztropine (COGENTIN) tablet 1 mg  1 mg Oral BID [name], MD   1 mg at 01/12/19 1610  . haloperidol (HALDOL) tablet 15 mg  15 mg Oral QHS [name], MD   15 mg at 01/11/19 2118  . haloperidol (HALDOL) tablet 5 mg  5 mg Oral Daily [name], MD   5 mg at 01/12/19 9604  . hydrOXYzine (ATARAX/VISTARIL) tablet 25 mg  25 mg Oral TID PRN [name] A, NP      . lithium carbonate (LITHOBID) CR tablet 300 mg  300 mg Oral Q12H [name], MD   300 mg at 01/12/19 5409  .  LORazepam (ATIVAN) tablet 2 mg  2 mg Oral Q4H PRN [name], MD       Or  . LORazepam (ATIVAN) injection 2 mg  2 mg Intramuscular Q4H PRN [name], MD      . temazepam (RESTORIL) capsule 30 mg  30 mg Oral QHS [name], MD   30 mg at 01/11/19 2120  . traZODone (DESYREL) tablet 50 mg  50 mg Oral QHS,MR X 1 [name] A, NP   50 mg at 01/11/19 2128    Lab Results: No results found for this or any previous visit (from the past 27 hour(s)).  Blood Alcohol level:  Lab Results  Component Value Date   ETH <10 01/09/2019   ETH <10 81/19/1478    Metabolic Disorder Labs: Lab Results  Component Value Date   HGBA1C 5.0 12/22/2016   MPG 97 12/22/2016   MPG 97 09/09/2015   Lab Results  Component Value Date   PROLACTIN 2.4 (L) 12/22/2016   PROLACTIN 8.0 09/09/2015   Lab Results  Component Value Date   CHOL 130 12/22/2016   TRIG 193 (H) 12/22/2016   HDL 35 (L) 12/22/2016   CHOLHDL 3.7 12/22/2016   VLDL 39 12/22/2016   LDLCALC 56 12/22/2016   LDLCALC 84 09/09/2015    Physical Findings: AIMS: Facial and Oral Movements Muscles of Facial Expression: None, normal Lips and Perioral Area: None, normal Jaw: None, normal Tongue: None, normal,Extremity Movements Upper (arms, wrists, hands, fingers): None, normal Lower (legs, knees, ankles, toes): None, normal, Trunk Movements Neck, shoulders, hips: None, normal, Overall Severity Severity of abnormal movements (highest score from questions above): None, normal Incapacitation due to abnormal movements: None, normal Patient's awareness of abnormal movements (rate only patient's report): No Awareness, Dental Status Current problems with teeth and/or dentures?: No Does patient usually wear dentures?: No  CIWA:    COWS:     Musculoskeletal: Strength & Muscle Tone: within normal limits Gait & Station: N/A Patient leans: N/A  Psychiatric Specialty Exam: Physical Exam  Constitutional: He appears well-developed.  Psychiatric:  He has a normal mood and affect. His behavior is normal.    Review of Systems  Psychiatric/Behavioral: Positive for depression and hallucinations. The patient is nervous/anxious.        Irritable and guarded   All other systems reviewed and are negative.   Blood pressure 117/79, pulse 100, temperature 98.6 F (37 C), temperature source Oral, resp. rate 18,  height 5\' 2"  (1.575 m), weight 103.4 kg, SpO2 100 %.Body mass index is 41.7 kg/m.  General Appearance: Guarded  Eye Contact:  Minimal  Speech:  Clear and Coherent  Volume:  Normal  Mood:  Angry  Affect:  Flat  Thought Process:  Linear  Orientation:  NA  Thought Content:  NA  Suicidal Thoughts:  N/A  Homicidal Thoughts:  N/A  Memory:  NA  Judgement:  NA  Insight:  Fair  Psychomotor Activity:  NA  Concentration:  Concentration: NA  Recall:  NA  Fund of Knowledge:  NA  Language:  NA  Akathisia:  NA  Handed:   AIMS (if indicated):     Assets:  Communication Skills Desire for Improvement Social Support  ADL's:  Intact  Cognition:  WNL  Sleep:  Number of Hours: 6.5     Treatment Plan Summary: Daily contact with patient to assess and evaluate symptoms and progress in treatment and Medication management   Continue with current treatment plan on January 12, 2019 as listed below except where noted   Schizoaffective   Continue Haldol 5 mg daily and 15 mg QHS  Continue Cogentin 1 mg p.o. twice daily Continue lithium 300 mg p.o. twice daily Continue Restoril 30 mg p.o. nightly Continue trazodone 50 mg p.o. PRN nightly  CSW to continue working on discharge disposition Patient encouraged to participate within the therapeutic milieu  Derrill [place], NP 01/12/2019, 12:35 PM   Attest to NP progress note

## 2019-01-12 NOTE — Progress Notes (Signed)
[facility] NOVEL CORONAVIRUS (COVID-19) DAILY CHECK-OFF SYMPTOMS - answer yes or no to each - every day NO YES  Have you had a fever in the past 24 hours?  . Fever (Temp > 37.80C / 100F) X   Have you had any of these symptoms in the past 24 hours? . New Cough .  Sore Throat  .  Shortness of Breath .  Difficulty Breathing .  Unexplained Body Aches   X   Have you had any one of these symptoms in the past 24 hours not related to allergies?   . Runny Nose .  Nasal Congestion .  Sneezing   X   If you have had runny nose, nasal congestion, sneezing in the past 24 hours, has it worsened?  X   EXPOSURES - check yes or no X   Have you traveled outside the state in the past 14 days?  X   Have you been in contact with someone with a confirmed diagnosis of COVID-19 or PUI in the past 14 days without wearing appropriate PPE?  X   Have you been living in the same home as a person with confirmed diagnosis of COVID-19 or a PUI (household contact)?    X   Have you been diagnosed with COVID-19?    X              What to do next: Answered NO to all: Answered YES to anything:   Proceed with unit schedule Follow the [facility] Inpatient Flowsheet.   
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
## 2019-01-12 NOTE — Progress Notes (Signed)
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 Patient will follow directions on first prompt.  Behavioral Response: Appropriate  Intervention: Worksheet  Activity:  Staff on [employer] were provided with a worksheet on [employer]. Staff was instructed to give it to the patients and have them work on it in place of [place]. Staff was also given 2 coloring sheets and 2 word search and were given the option to give them out.  Education:  Ability to follow Directions, Change of thought processes Discharge Planning.   Education Outcome: Acknowledges education/In group clarification offered  Clinical Observations/Feedback: . Due to COVID-19, guidelines group was not held. Group members were provided a learning activity worksheet to work on the topic and above-stated goals. LRT is available to answer any questions patient may have regarding the worksheet.  Tomi Likens, LRT/CTRS         [name] L Mae Cianci 01/12/2019 12:49 PM

## 2019-01-12 NOTE — Progress Notes (Signed)
Patient ID: Justin Chaney, male   DOB: [birth year], 23 y.o.   MRN: [id number] D: Pt with angry affect, appears guarded, refused to speak when staff attempted to engage him in conversations, however, he denies SI/HI/AVH.  A: Pt given all meds this AM and took them as scheduled. Pt is being maintained on Q15 minute checks for safety.  R: Will continue to monitor on Q15 minute safety checks

## 2019-01-12 NOTE — Progress Notes (Signed)
spirituality group facilitated by Simone Curia, MDIv, B[facility].  Group Description:  Group focused on topic of hope.  Patients participated in facilitated discussion around topic, connecting with one another around experiences and definitions for hope.  Group members engaged with visual explorer photos, reflecting on what hope looks like for them today.  Group engaged in discussion around how their definitions of hope are present today in hospital.   Modalities: Psycho-social ed, Adlerian, Narrative, MI Patient Progress: Pt invited to group.  Did not attend

## 2019-01-12 NOTE — Tx Team (Signed)
Interdisciplinary Treatment and Diagnostic Plan Update  01/12/2019 Time of Session: 09:47am Justin Chaney MRN: [id number]  Principal Diagnosis: <principal problem not specified>  Secondary Diagnoses: Active Problems:   Schizoaffective disorder (HCC)   Current Medications:  Current Facility-Administered Medications  Medication Dose Route Frequency Provider Last Rate Last Dose  . acetaminophen (TYLENOL) tablet 650 mg  650 mg Oral Q6H PRN Mordecai Maes, NP      . alum & mag hydroxide-simeth (MAALOX/MYLANTA) 200-200-20 MG/5ML suspension 30 mL  30 mL Oral Q4H PRN Mordecai Maes, NP      . benztropine (COGENTIN) tablet 1 mg  1 mg Oral BID [name], MD   1 mg at 01/12/19 9935  . haloperidol (HALDOL) tablet 15 mg  15 mg Oral QHS [name], MD   15 mg at 01/11/19 2118  . haloperidol (HALDOL) tablet 5 mg  5 mg Oral Daily [name], MD   5 mg at 01/12/19 7017  . hydrOXYzine (ATARAX/VISTARIL) tablet 25 mg  25 mg Oral TID PRN [name] A, NP      . lithium carbonate (LITHOBID) CR tablet 300 mg  300 mg Oral Q12H [name], MD   300 mg at 01/12/19 7939  . LORazepam (ATIVAN) tablet 2 mg  2 mg Oral Q4H PRN [name], MD       Or  . LORazepam (ATIVAN) injection 2 mg  2 mg Intramuscular Q4H PRN [name], MD      . temazepam (RESTORIL) capsule 30 mg  30 mg Oral QHS [name], MD   30 mg at 01/11/19 2120  . traZODone (DESYREL) tablet 50 mg  50 mg Oral QHS,MR X 1 [name] A, NP   50 mg at 01/11/19 2128   PTA Medications: Medications Prior to Admission  Medication Sig Dispense Refill Last Dose  . acetaminophen (TYLENOL) 325 MG tablet Take 2 tablets (650 mg total) by mouth every 6 (six) hours as needed. (Patient not taking: Reported on 08/01/2018) 56 tablet 0   . ARIPiprazole (ABILIFY) 10 MG tablet Take 1 tablet (10 mg total) by mouth at bedtime. For depression (Patient not taking: Reported on 01/10/2019) 30 tablet 0   . hydrOXYzine (ATARAX/VISTARIL) 25 MG tablet Take 1  tablet (25 mg total) by mouth 3 (three) times daily as needed for anxiety. (Patient not taking: Reported on 08/01/2018) 60 tablet 0   . lithium carbonate (LITHOBID) 300 MG CR tablet Take 300 mg by mouth 2 (two) times daily.   1   . lithium carbonate 600 MG capsule Take 1 capsule (600 mgs) in the morning and at supper: For mood stabilization (Patient not taking: Reported on 08/18/2018) 60 capsule 0   . nicotine (NICODERM CQ - DOSED IN MG/24 HOURS) 21 mg/24hr patch Place 1 patch (21 mg total) onto the skin daily. For smoking ceasation (Patient not taking: Reported on 03/08/2018) 28 patch 0   . traZODone (DESYREL) 50 MG tablet Take 1 tablet (50 mg total) by mouth at bedtime as needed for sleep. (Patient not taking: Reported on 08/01/2018) 30 tablet 0     Patient Stressors: Other: psychosis   Patient Strengths: Armed forces logistics/support/administrative officer Physical Health Supportive family/friends  Treatment Modalities: Medication Management, Group therapy, Case management,  1 to 1 session with clinician, Psychoeducation, Recreational therapy.   Physician Treatment Plan for Primary Diagnosis: <principal problem not specified> Long Term Goal(s): Improvement in symptoms so as ready for discharge Improvement in symptoms so as ready for discharge   Short Term Goals: Ability to  demonstrate self-control will improve Ability to identify and develop effective coping behaviors will improve Ability to maintain clinical measurements within normal limits will improve Compliance with prescribed medications will improve  Medication Management: Evaluate patient's response, side effects, and tolerance of medication regimen.  Therapeutic Interventions: 1 to 1 sessions, Unit Group sessions and Medication administration.  Evaluation of Outcomes: Not Progressing  Physician Treatment Plan for Secondary Diagnosis: Active Problems:   Schizoaffective disorder ([place])  Long Term Goal(s): Improvement in symptoms so as ready for  discharge Improvement in symptoms so as ready for discharge   Short Term Goals: Ability to demonstrate self-control will improve Ability to identify and develop effective coping behaviors will improve Ability to maintain clinical measurements within normal limits will improve Compliance with prescribed medications will improve     Medication Management: Evaluate patient's response, side effects, and tolerance of medication regimen.  Therapeutic Interventions: 1 to 1 sessions, Unit Group sessions and Medication administration.  Evaluation of Outcomes: Not Progressing   RN Treatment Plan for Primary Diagnosis: <principal problem not specified> Long Term Goal(s): Knowledge of disease and therapeutic regimen to maintain health will improve  Short Term Goals: Ability to participate in decision making will improve, Ability to verbalize feelings will improve, Ability to disclose and discuss suicidal ideas, Ability to identify and develop effective coping behaviors will improve and Compliance with prescribed medications will improve  Medication Management: RN will administer medications as ordered by provider, will assess and evaluate patient's response and provide education to patient for prescribed medication. RN will report any adverse and/or side effects to prescribing provider.  Therapeutic Interventions: 1 on 1 counseling sessions, Psychoeducation, Medication administration, Evaluate responses to treatment, Monitor vital signs and CBGs as ordered, Perform/monitor CIWA, COWS, AIMS and Fall Risk screenings as ordered, Perform wound care treatments as ordered.  Evaluation of Outcomes: Not Progressing   LCSW Treatment Plan for Primary Diagnosis: <principal problem not specified> Long Term Goal(s): Safe transition to appropriate next level of care at discharge, Engage patient in therapeutic group addressing interpersonal concerns.  Short Term Goals: Engage patient in aftercare planning with  referrals and resources and Increase skills for wellness and recovery  Therapeutic Interventions: Assess for all discharge needs, 1 to 1 time with Social worker, Explore available resources and support systems, Assess for adequacy in community support network, Educate family and significant other(s) on suicide prevention, Complete Psychosocial Assessment, Interpersonal group therapy.  Evaluation of Outcomes: Not Progressing   Progress in Treatment: Attending groups: No. Participating in groups: No. Taking medication as prescribed: Yes. Toleration medication: Yes. Family/Significant other contact made: No, will contact:  will contact if given consent to contact Patient understands diagnosis: No. Discussing patient identified problems/goals with staff: Yes. Medical problems stabilized or resolved: Yes. Denies suicidal/homicidal ideation: Yes. Issues/concerns per patient self-inventory: No. Other:   New problem(s) identified: No, Describe:  None  New Short Term/Long Term Goal(s):  Patient Goals:  Pt could not answer  Discharge Plan or Barriers: Pt has not gotten out of bed to complete any assessments or aftercare with CSW.  Reason for Continuation of Hospitalization: Delusions  Hallucinations Medication stabilization  Estimated Length of Stay: 3-5 days  Attendees: Patient: 01/12/2019   Physician: Dr. Neita Garnet, MD 01/12/2019  Nursing: Tamela Oddi, RN 01/12/2019   RN Care Manager: 01/12/2019   Social Worker: Ardelle Anton, LCSW 01/12/2019   Recreational Therapist:  01/12/2019   Other:  01/12/2019   Other:  01/12/2019   Other: 01/12/2019       Scribe  for Treatment Team: Trecia Rogers, Marlinda Mike 01/12/2019 10:49 AM

## 2019-01-13 NOTE — Progress Notes (Signed)
Patient has been isolative to his room tonight, only coming out to take his scheduled medications. He has a blunted affect and does not want to talk with Probation officer. He answered questions asked and that was it. He denies si/hi/auditroy/visual hallucinations. Writer feels that patient is responding to internal stimuli. Safety maintained on unit with 15 min checks.

## 2019-01-13 NOTE — Plan of Care (Signed)
Progress note  D: pt found in bed; compliant with medication administration. Pt denies any physical symptoms. Pt is still denying his name is actually his, but is more pleasant about this. Pt has been reclusive to his room. Pt denies si/h/ah/vh and verbally agrees to approach staff if these become apparent or before harming himself/others while at [facility]. A: pt provided support and encouragement. Pt given medication per protocol and standing orders. Q19m safety checks implemented and continued.  R: pt safe on the unit. Will continue to monitor.   Pt progressing in the following metrics  Problem: Coping: Goal: Ability to demonstrate self-control will improve Outcome: Progressing   Problem: Health Behavior/Discharge Planning: Goal: Compliance with treatment plan for underlying cause of condition will improve Outcome: Progressing   Problem: Physical Regulation: Goal: Ability to maintain clinical measurements within normal limits will improve Outcome: Progressing   Problem: Safety: Goal: Periods of time without injury will increase Outcome: Progressing

## 2019-01-13 NOTE — Progress Notes (Addendum)
[facility] MD Progress Note  01/13/2019 1:07 PM Justin Chaney  MRN:  [id number]   Subjective:  Justin Chaney reported " I want to seen if I can get my own place."   Evaluation: Justin Chaney seen resting in bed. Continues to present flat and guarded however less irritable than on yesteday.  He reports taking medications as prescribed denies medication side effects.  Patient reports auditory hallucinations that are intermittent denies that they are command in nature.  Patient is focused on discharging this relates to housing and group home placement.  Patient advised to follow-up with social work.  Denies depression or depressive symptoms.  Chart review patient continues to isolate to her room.  Patient reports a good appetite.  States he is resting well throughout the night.  Support,  encouragement and reassurance were provided.   Principal Problem: Schizoaffective disorder ([place]) Diagnosis: Principal Problem:   Schizoaffective disorder ([place])  Total Time spent with patient: 15 minutes  Past Psychiatric History:   Past Medical History:  Past Medical History:  Diagnosis Date  . Asthma   . Bipolar disorder ([place])   . 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 Depression   . Homelessness   . Schizophrenia ([place])   . Seizures ([place])    childhood seizures  . Suicidal behavior   . Suicidal intent     Past Surgical History:  Procedure Laterality Date  . APPENDECTOMY    . SHUNT REMOVAL    . TONSILLECTOMY    . VENTRICULO-PERITONEAL SHUNT PLACEMENT / LAPAROSCOPIC INSERTION PERITONEAL CATHETER     Family History:  Family History  Problem Relation Age of Onset  . Hypertension Mother   . Mental illness Neg Hx    Family Psychiatric  History:  Social History:  Social History   Substance and Sexual Activity  Alcohol Use No     Social History   Substance and Sexual Activity  Drug Use No  . Types: Other-see comments, Amphetamines   Comment: History of use    Social History    Socioeconomic History  . Marital status: Single    Spouse name: Not on file  . Number of children: Not on file  . Years of education: Not on file  . Highest education level: Not on file  Occupational History  . Not on file  Social Needs  . Financial resource strain: Not on file  . Food insecurity    Worry: Not on file    Inability: Not on file  . Transportation needs    Medical: Not on file    Non-medical: Not on file  Tobacco Use  . Smoking status: Current Every Day Smoker    Packs/day: 0.00    Types: Cigars  . Smokeless tobacco: Never Used  Substance and Sexual Activity  . Alcohol use: No  . Drug use: No    Types: Other-see comments, Amphetamines    Comment: History of use  . Sexual activity: Not on file  Lifestyle  . Physical activity    Days per week: Not on file    Minutes per session: Not on file  . Stress: Not on file  Relationships  . Social Herbalist on phone: Not on file    Gets together: Not on file    Attends religious service: Not on file    Active member of club or organization: Not on file    Attends meetings of clubs or organizations: Not on file    Relationship status:  Not on file  Other Topics Concern  . Not on file  Social History Narrative  . Not on file   Additional Social History:                         Sleep: NA  Appetite:  NA  Current Medications: Current Facility-Administered Medications  Medication Dose Route Frequency Provider Last Rate Last Dose  . acetaminophen (TYLENOL) tablet 650 mg  650 mg Oral Q6H PRN Mordecai Maes, NP      . alum & mag hydroxide-simeth (MAALOX/MYLANTA) 200-200-20 MG/5ML suspension 30 mL  30 mL Oral Q4H PRN Mordecai Maes, NP      . benztropine (COGENTIN) tablet 1 mg  1 mg Oral BID [name], MD   1 mg at 01/13/19 0745  . haloperidol (HALDOL) tablet 15 mg  15 mg Oral QHS [name], MD   15 mg at 01/12/19 2117  . haloperidol (HALDOL) tablet 5 mg  5 mg Oral Daily [name], MD   5 mg at 01/13/19 0745  . hydrOXYzine (ATARAX/VISTARIL) tablet 25 mg  25 mg Oral TID PRN [name] A, NP      . lithium carbonate (LITHOBID) CR tablet 300 mg  300 mg Oral Q12H [name], MD   300 mg at 01/13/19 0745  . LORazepam (ATIVAN) tablet 2 mg  2 mg Oral Q4H PRN [name], MD       Or  . LORazepam (ATIVAN) injection 2 mg  2 mg Intramuscular Q4H PRN [name], MD      . temazepam (RESTORIL) capsule 30 mg  30 mg Oral QHS [name], MD   30 mg at 01/12/19 2116  . traZODone (DESYREL) tablet 50 mg  50 mg Oral QHS,MR X 1 [name] A, NP   50 mg at 01/12/19 2118    Lab Results: No results found for this or any previous visit (from the past 48 hour(s)).  Blood Alcohol level:  Lab Results  Component Value Date   ETH <10 01/09/2019   ETH <10 76/73/4193    Metabolic Disorder Labs: Lab Results  Component Value Date   HGBA1C 5.0 12/22/2016   MPG 97 12/22/2016   MPG 97 09/09/2015   Lab Results  Component Value Date   PROLACTIN 2.4 (L) 12/22/2016   PROLACTIN 8.0 09/09/2015   Lab Results  Component Value Date   CHOL 130 12/22/2016   TRIG 193 (H) 12/22/2016   HDL 35 (L) 12/22/2016   CHOLHDL 3.7 12/22/2016   VLDL 39 12/22/2016   LDLCALC 56 12/22/2016   LDLCALC 84 09/09/2015    Physical Findings: AIMS: Facial and Oral Movements Muscles of Facial Expression: None, normal Lips and Perioral Area: None, normal Jaw: None, normal Tongue: None, normal,Extremity Movements Upper (arms, wrists, hands, fingers): None, normal Lower (legs, knees, ankles, toes): None, normal, Trunk Movements Neck, shoulders, hips: None, normal, Overall Severity Severity of abnormal movements (highest score from questions above): None, normal Incapacitation due to abnormal movements: None, normal Patient's awareness of abnormal movements (rate only patient's report): No Awareness, Dental Status Current problems with teeth and/or dentures?: No Does patient usually wear  dentures?: No  CIWA:    COWS:     Musculoskeletal: Strength & Muscle Tone: within normal limits Gait & Station: N/A Patient leans: N/A  Psychiatric Specialty Exam: Physical Exam  Constitutional: He appears well-developed.  Psychiatric: He has a normal mood and affect. His behavior is normal.    Review  of Systems  Psychiatric/Behavioral: Positive for depression and hallucinations. The patient is nervous/anxious.        Irritable and guarded   All other systems reviewed and are negative.   Blood pressure 117/79, pulse 100, temperature 98.6 F (37 C), temperature source Oral, resp. rate 18, height 5\' 2"  (1.575 m), weight 103.4 kg, SpO2 100 %.Body mass index is 41.7 kg/m.  General Appearance: Guarded  Eye Contact:  Minimal  Speech:  Clear and Coherent  Volume:  Normal  Mood:  Angry  Affect:  Flat  Thought Process:  Linear  Orientation:  NA  Thought Content:  NA  Suicidal Thoughts:  N/A  Homicidal Thoughts:  N/A  Memory:  NA  Judgement:  NA  Insight:  Fair  Psychomotor Activity:  NA  Concentration:  Concentration: NA  Recall:  NA  Fund of Knowledge:  NA  Language:  NA  Akathisia:  NA  Handed:   AIMS (if indicated):     Assets:  Communication Skills Desire for Improvement Social Support  ADL's:  Intact  Cognition:  WNL  Sleep:  Number of Hours: 6.75     Treatment Plan Summary: Daily contact with patient to assess and evaluate symptoms and progress in treatment and Medication management   Continue with current treatment plan on 6/13/ 2020 as listed below except where noted   Schizoaffective   Continue Haldol 5 mg daily and 15 mg QHS  Continue Cogentin 1 mg p.o. twice daily Continue lithium 300 mg p.o. twice daily Continue Restoril 30 mg p.o. nightly Continue trazodone 50 mg p.o. PRN nightly  CSW to continue working on discharge disposition Patient encouraged to participate within the therapeutic milieu  Derrill [place], NP 01/13/2019, 1:07 PM   Attest to  NP progress note

## 2019-01-13 NOTE — [facility] Group Notes (Signed)
[facility] Group Notes: (Clinical Social Work)   01/13/2019      Type of Therapy:  Group Therapy   Participation Level:  Did Not Attend - was invited both individually by MHT and by overhead announcement, chose not to attend.   Selmer Dominion, LCSW 01/13/2019, 12:31 PM

## 2019-01-14 NOTE — Progress Notes (Addendum)
[facility] MD Progress Note  01/14/2019 1:25 PM Justin Chaney  MRN:  [id number]    Evaluation: [name] observed resting in bed.  Patient continues to isolate to his room.  Patient was more engaged throughout this assessment than on previous attempts for daily evaluation. He reported voice has decreased since restarting medicaitons. Patient as asked to folllow-up social worker in order to be placed at a group home. Reported " I don't want to go back with my mom at this time." Patient didn't provided additional explanation with not wanting to return home. Patient continues to present flat, guarded and irritable.  Reports his depression 3 out of 10 with 10 being the worst.  Denies suicidal or homicidal ideations.  [name] reports mild sedation with medication however states " I just need to get used to them again" as he reports he has been off his medication prior to admission.  Reports a good appetite.  States he is rested well throughout the night.  Support,encouragement and reassurance was provided.  Principal Problem: Schizoaffective disorder ([place]) Diagnosis: Principal Problem:   Schizoaffective disorder ([place])  Total Time spent with patient: 15 minutes  Past Psychiatric History:   Past Medical History:  Past Medical History:  Diagnosis Date  . Asthma   . Bipolar disorder ([place])   . 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 Depression   . Homelessness   . Schizophrenia ([place])   . Seizures ([place])    childhood seizures  . Suicidal behavior   . Suicidal intent     Past Surgical History:  Procedure Laterality Date  . APPENDECTOMY    . SHUNT REMOVAL    . TONSILLECTOMY    . VENTRICULO-PERITONEAL SHUNT PLACEMENT / LAPAROSCOPIC INSERTION PERITONEAL CATHETER     Family History:  Family History  Problem Relation Age of Onset  . Hypertension Mother   . Mental illness Neg Hx    Family Psychiatric  History:  Social History:  Social History   Substance and Sexual Activity   Alcohol Use No     Social History   Substance and Sexual Activity  Drug Use No  . Types: Other-see comments, Amphetamines   Comment: History of use    Social History   Socioeconomic History  . Marital status: Single    Spouse name: Not on file  . Number of children: Not on file  . Years of education: Not on file  . Highest education level: Not on file  Occupational History  . Not on file  Social Needs  . Financial resource strain: Not on file  . Food insecurity    Worry: Not on file    Inability: Not on file  . Transportation needs    Medical: Not on file    Non-medical: Not on file  Tobacco Use  . Smoking status: Current Every Day Smoker    Packs/day: 0.00    Types: Cigars  . Smokeless tobacco: Never Used  Substance and Sexual Activity  . Alcohol use: No  . Drug use: No    Types: Other-see comments, Amphetamines    Comment: History of use  . Sexual activity: Not on file  Lifestyle  . Physical activity    Days per week: Not on file    Minutes per session: Not on file  . Stress: Not on file  Relationships  . Social Herbalist on phone: Not on file    Gets together: Not on file    Attends religious  service: Not on file    Active member of club or organization: Not on file    Attends meetings of clubs or organizations: Not on file    Relationship status: Not on file  Other Topics Concern  . Not on file  Social History Narrative  . Not on file   Additional Social History:                         Sleep: NA  Appetite:  NA  Current Medications: Current Facility-Administered Medications  Medication Dose Route Frequency Provider Last Rate Last Dose  . acetaminophen (TYLENOL) tablet 650 mg  650 mg Oral Q6H PRN Mordecai Maes, NP      . alum & mag hydroxide-simeth (MAALOX/MYLANTA) 200-200-20 MG/5ML suspension 30 mL  30 mL Oral Q4H PRN Mordecai Maes, NP      . benztropine (COGENTIN) tablet 1 mg  1 mg Oral BID [name], MD   1 mg  at 01/14/19 0745  . haloperidol (HALDOL) tablet 15 mg  15 mg Oral QHS [name], MD   15 mg at 01/13/19 2135  . haloperidol (HALDOL) tablet 5 mg  5 mg Oral Daily [name], MD   5 mg at 01/14/19 0745  . hydrOXYzine (ATARAX/VISTARIL) tablet 25 mg  25 mg Oral TID PRN [name] A, NP      . lithium carbonate (LITHOBID) CR tablet 300 mg  300 mg Oral Q12H [name], MD   300 mg at 01/14/19 0745  . LORazepam (ATIVAN) tablet 2 mg  2 mg Oral Q4H PRN [name], MD       Or  . LORazepam (ATIVAN) injection 2 mg  2 mg Intramuscular Q4H PRN [name], MD      . temazepam (RESTORIL) capsule 30 mg  30 mg Oral QHS [name], MD   30 mg at 01/13/19 2136  . traZODone (DESYREL) tablet 50 mg  50 mg Oral QHS,MR X 1 [name] A, NP   50 mg at 01/13/19 2136    Lab Results: No results found for this or any previous visit (from the past 48 hour(s)).  Blood Alcohol level:  Lab Results  Component Value Date   ETH <10 01/09/2019   ETH <10 93/79/0240    Metabolic Disorder Labs: Lab Results  Component Value Date   HGBA1C 5.0 12/22/2016   MPG 97 12/22/2016   MPG 97 09/09/2015   Lab Results  Component Value Date   PROLACTIN 2.4 (L) 12/22/2016   PROLACTIN 8.0 09/09/2015   Lab Results  Component Value Date   CHOL 130 12/22/2016   TRIG 193 (H) 12/22/2016   HDL 35 (L) 12/22/2016   CHOLHDL 3.7 12/22/2016   VLDL 39 12/22/2016   LDLCALC 56 12/22/2016   LDLCALC 84 09/09/2015    Physical Findings: AIMS: Facial and Oral Movements Muscles of Facial Expression: None, normal Lips and Perioral Area: None, normal Jaw: None, normal Tongue: None, normal,Extremity Movements Upper (arms, wrists, hands, fingers): None, normal Lower (legs, knees, ankles, toes): None, normal, Trunk Movements Neck, shoulders, hips: None, normal, Overall Severity Severity of abnormal movements (highest score from questions above): None, normal Incapacitation due to abnormal movements: None, normal Patient's  awareness of abnormal movements (rate only patient's report): No Awareness, Dental Status Current problems with teeth and/or dentures?: No Does patient usually wear dentures?: No  CIWA:    COWS:     Musculoskeletal: Strength & Muscle Tone: within normal limits Gait &  Station: N/A Patient leans: N/A  Psychiatric Specialty Exam: Physical Exam  Vitals reviewed. Constitutional: He appears well-developed.  Psychiatric: He has a normal mood and affect. His behavior is normal.    Review of Systems  Psychiatric/Behavioral: Positive for depression and hallucinations. The patient is nervous/anxious.        Irritable and guarded   All other systems reviewed and are negative.   Blood pressure 117/79, pulse 100, temperature 98.6 F (37 C), temperature source Oral, resp. rate 18, height 5\' 2"  (1.575 m), weight 103.4 kg, SpO2 100 %.Body mass index is 41.7 kg/m.  General Appearance: Guarded  Eye Contact:  Minimal  Speech:  Clear and Coherent  Volume:  Normal  Mood:  Angry  Affect:  Flat  Thought Process:  Linear  Orientation:  NA  Thought Content:  NA  Suicidal Thoughts:  N/A  Homicidal Thoughts:  N/A  Memory:  NA  Judgement:  NA  Insight:  Fair  Psychomotor Activity:  NA  Concentration:  Concentration: NA  Recall:  NA  Fund of Knowledge:  NA  Language:  NA  Akathisia:  NA  Handed:   AIMS (if indicated):     Assets:  Communication Skills Desire for Improvement Social Support  ADL's:  Intact  Cognition:  WNL  Sleep:  Number of Hours: 6.75     Treatment Plan Summary: Daily contact with patient to assess and evaluate symptoms and progress in treatment and Medication management   Continue with current treatment plan on 01/14/2019 as listed below except where noted   Schizoaffective:  Continue Haldol 5 mg daily and 15 mg QHS  Continue Cogentin 1 mg p.o. twice daily Continue lithium 300 mg p.o. twice daily Continue Restoril 30 mg p.o. nightly Continue trazodone 50 mg p.o.  PRN nightly  CSW to continue working on discharge disposition Patient encouraged to participate within the therapeutic milieu  Derrill Center, NP 01/14/2019, 1:25 PM   Attest to NP progress note

## 2019-01-14 NOTE — Plan of Care (Signed)
Progress note  D: pt found in bed; compliant with medication administration. Pt isn't as confrontational as previous interactions. Pt denies any physical pain or symptoms, but is concrete in his thinking. Pt denies any si/hi/ah/vh and verbally agrees to approach staff if these become apparent or before harming himself/others while at [place]. A: pt provided support and encouragement. Pt given medication per protocol and standing orders. Q69m safety checks implemented and continued.  R: pt safe on the unit. Will continue to monitor.  Pt progressing in the following metrics  Problem: Education: Goal: Mental status will improve Outcome: Progressing   Problem: Coping: Goal: Ability to verbalize frustrations and anger appropriately will improve Outcome: Progressing   Problem: Education: Goal: Knowledge of the prescribed therapeutic regimen will improve Outcome: Progressing

## 2019-01-14 NOTE — Progress Notes (Signed)
Patient has been isolative to his room but was cooperative and came for his scheduled medications and snack. He reports that he plans to stay on his medications once discharged. He has been in his room reading his bible. Support givne and safety maintained on unit with 15 min checks

## 2019-01-14 NOTE — [facility] Group Notes (Signed)
[facility] LCSW Group Therapy Note  Date/Time:  01/14/2019  11:00AM-12:00PM  Type of Therapy and Topic:  Group Therapy:  Music and Mood  Participation Level:  Did Not Attend   Description of Group: In this process group, members listened to a variety of genres of music and identified that different types of music evoke different responses.  Patients were encouraged to identify music that was soothing for them and music that was energizing for them.  Patients discussed how this knowledge can help with wellness and recovery in various ways including managing depression and anxiety as well as encouraging healthy sleep habits.    Therapeutic Goals: 1. Patients will explore the impact of different varieties of music on mood 2. Patients will verbalize the thoughts they have when listening to different types of music 3. Patients will identify music that is soothing to them as well as music that is energizing to them 4. Patients will discuss how to use this knowledge to assist in maintaining wellness and recovery 5. Patients will explore the use of music as a coping skill  Summary of Patient Progress:  Did not attend  Therapeutic Modalities: Solution Focused Brief Therapy Activity   Selmer Dominion, LCSW

## 2019-01-15 MED ORDER — HALOPERIDOL 5 MG PO TABS
20.0000 mg | ORAL_TABLET | Freq: Every day | ORAL | Status: DC
  Administered 2019-01-15: 20 mg via ORAL
  Filled 2019-01-15: qty 4
  Filled 2019-01-15: qty 28
  Filled 2019-01-15 (×2): qty 4

## 2019-01-15 NOTE — Plan of Care (Signed)
Progress note  D: pt found in bed; compliant with medication administration. Pt denies any physical pain or symptoms. Pt was groggy and slightly disoriented, though improved from yesterday. Pt failed to go to the cafeteria today. Pt denies si/hi/ah/vh and verbally agrees to approach staff if these become apparent or before harming himself/others while at [place].  A: pt provided support and encouragement. Pt given medication per protocol and standing orders. Q34m safety checks implemented and continued.  R: pt safe on the unit. Will continue to monitor.   Pt progressing int he following metrics  Problem: Activity: Goal: Will verbalize the importance of balancing activity with adequate rest periods Outcome: Progressing   Problem: Education: Goal: Will be free of psychotic symptoms Outcome: Progressing   Problem: Coping: Goal: Will verbalize feelings Outcome: Progressing

## 2019-01-15 NOTE — Progress Notes (Signed)
[facility] MD Progress Note  01/15/2019 2:12 PM Justin Chaney  MRN:  [id number] Subjective:   Patient seen remains in his room states he slept well reports continued but diminished auditory hallucinations without content of harming himself in the context of these hallucinations.  Denies wanting to harm self no EPS or TD he can contract here understands what that means no EPS or TD Principal Problem: Schizoaffective disorder ([place]) Diagnosis: Principal Problem:   Schizoaffective disorder ([place])  Total Time spent with patient: 20 minutes  Past Medical History:  Past Medical History:  Diagnosis Date  . Asthma   . Bipolar disorder ([place])   . 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 Depression   . Homelessness   . Schizophrenia ([place])   . Seizures ([place])    childhood seizures  . Suicidal behavior   . Suicidal intent     Past Surgical History:  Procedure Laterality Date  . APPENDECTOMY    . SHUNT REMOVAL    . TONSILLECTOMY    . VENTRICULO-PERITONEAL SHUNT PLACEMENT / LAPAROSCOPIC INSERTION PERITONEAL CATHETER     Family History:  Family History  Problem Relation Age of Onset  . Hypertension Mother   . Mental illness Neg Hx    Family Psychiatric  History: neg Social History:  Social History   Substance and Sexual Activity  Alcohol Use No     Social History   Substance and Sexual Activity  Drug Use No  . Types: Other-see comments, Amphetamines   Comment: History of use    Social History   Socioeconomic History  . Marital status: Single    Spouse name: Not on file  . Number of children: Not on file  . Years of education: Not on file  . Highest education level: Not on file  Occupational History  . Not on file  Social Needs  . Financial resource strain: Not on file  . Food insecurity    Worry: Not on file    Inability: Not on file  . Transportation needs    Medical: Not on file    Non-medical: Not on file  Tobacco Use  . Smoking status: Current  Every Day Smoker    Packs/day: 0.00    Types: Cigars  . Smokeless tobacco: Never Used  Substance and Sexual Activity  . Alcohol use: No  . Drug use: No    Types: Other-see comments, Amphetamines    Comment: History of use  . Sexual activity: Not on file  Lifestyle  . Physical activity    Days per week: Not on file    Minutes per session: Not on file  . Stress: Not on file  Relationships  . Social Herbalist on phone: Not on file    Gets together: Not on file    Attends religious service: Not on file    Active member of club or organization: Not on file    Attends meetings of clubs or organizations: Not on file    Relationship status: Not on file  Other Topics Concern  . Not on file  Social History Narrative  . Not on file   Additional Social History:                         Sleep: Good  Appetite:  Good  Current Medications: Current Facility-Administered Medications  Medication Dose Route Frequency Provider Last Rate Last Dose  . acetaminophen (TYLENOL) tablet 650 mg  650 mg Oral Q6H PRN Mordecai Maes, NP      . alum & mag hydroxide-simeth (MAALOX/MYLANTA) 200-200-20 MG/5ML suspension 30 mL  30 mL Oral Q4H PRN Mordecai Maes, NP      . benztropine (COGENTIN) tablet 1 mg  1 mg Oral BID [name], MD   1 mg at 01/15/19 3419  . haloperidol (HALDOL) tablet 20 mg  20 mg Oral QHS [name], MD      . haloperidol (HALDOL) tablet 5 mg  5 mg Oral Daily [name], MD   5 mg at 01/15/19 6222  . hydrOXYzine (ATARAX/VISTARIL) tablet 25 mg  25 mg Oral TID PRN [name] A, NP      . lithium carbonate (LITHOBID) CR tablet 300 mg  300 mg Oral Q12H [name], MD   300 mg at 01/15/19 0729  . LORazepam (ATIVAN) tablet 2 mg  2 mg Oral Q4H PRN [name], MD       Or  . LORazepam (ATIVAN) injection 2 mg  2 mg Intramuscular Q4H PRN [name], MD      . temazepam (RESTORIL) capsule 30 mg  30 mg Oral QHS [name], MD   30 mg at 01/14/19 2120  .  traZODone (DESYREL) tablet 50 mg  50 mg Oral QHS,MR X 1 [name] A, NP   50 mg at 01/14/19 2120    Lab Results: No results found for this or any previous visit (from the past 48 hour(s)).  Blood Alcohol level:  Lab Results  Component Value Date   ETH <10 01/09/2019   ETH <10 97/98/9211    Metabolic Disorder Labs: Lab Results  Component Value Date   HGBA1C 5.0 12/22/2016   MPG 97 12/22/2016   MPG 97 09/09/2015   Lab Results  Component Value Date   PROLACTIN 2.4 (L) 12/22/2016   PROLACTIN 8.0 09/09/2015   Lab Results  Component Value Date   CHOL 130 12/22/2016   TRIG 193 (H) 12/22/2016   HDL 35 (L) 12/22/2016   CHOLHDL 3.7 12/22/2016   VLDL 39 12/22/2016   LDLCALC 56 12/22/2016   LDLCALC 84 09/09/2015    Physical Findings: AIMS: Facial and Oral Movements Muscles of Facial Expression: None, normal Lips and Perioral Area: None, normal Jaw: None, normal Tongue: None, normal,Extremity Movements Upper (arms, wrists, hands, fingers): None, normal Lower (legs, knees, ankles, toes): None, normal, Trunk Movements Neck, shoulders, hips: None, normal, Overall Severity Severity of abnormal movements (highest score from questions above): None, normal Incapacitation due to abnormal movements: None, normal Patient's awareness of abnormal movements (rate only patient's report): No Awareness, Dental Status Current problems with teeth and/or dentures?: No Does patient usually wear dentures?: No  CIWA:    COWS:     Musculoskeletal: Strength & Muscle Tone:nl Gait & Station: normal Patient leans: N/A  Psychiatric Specialty Exam: Physical Exam  ROS  Blood pressure 117/79, pulse 100, temperature 98.6 F (37 C), temperature source Oral, resp. rate 18, height 5\' 2"  (1.575 m), weight 103.4 kg, SpO2 100 %.Body mass index is 41.7 kg/m.  General Appearance: Casual  Eye Contact:  Minimal  Speech:  Slow  Volume:  Decreased  Mood:  Dysphoric  Affect:  Appropriate and Congruent   Thought Process:  Coherent and Descriptions of Associations: Circumstantial  Orientation:  Full (Time, Place, and Person)  Thought Content:  Logical and Hallucinations: Auditory  Suicidal Thoughts:  No  Homicidal Thoughts:  No  Memory:  Recent;   Fair  Judgement:  Good  Insight:  Good  Psychomotor Activity:  Normal  Concentration:  Concentration: Fair  Recall:  Good  Fund of Knowledge:  Good  Language:  Good  Akathisia:  Negative  Handed:  Right  AIMS (if indicated):     Assets:  Leisure Time Physical Health  ADL's:  Intact  Cognition:  WNL  Sleep:  Number of Hours: 6.75     Treatment Plan Summary: Daily contact with patient to assess and evaluate symptoms and progress in treatment, Medication management and Plan To new cognitive and reality based therapy escalate Haldol to 20 mg at bedtime and 5 in the morning will continue no changes address negative symptoms through therapy  [name], MD 01/15/2019, 2:12 PM

## 2019-01-15 NOTE — Progress Notes (Signed)
Recreation Therapy Notes  Date: 6.15.20 Time: 1000 Location:  500 Hall Dayroom   Group Topic: Communication, Team Building, Problem Solving  Goal Area(s) Addresses:  Patient will effectively work with peer towards shared goal.  Patient will identify skill used to make activity successful.  Patient will identify how skills used during activity can be used to reach post d/c goals.   Intervention: STEM Activity   Activity:  Straw Bridge.  Patients were put into groups of 2-3.  Patients were given 20 straws and 2 feet of masking tape.  Patients were to work together to a free standing bridge that could hold a small 500 piece puzzle box.    Education: Education officer, community, Dentist.   Education Outcome: Acknowledges education/In group clarification offered/Needs additional education.   Clinical Observations/Feedback: Pt did not attend group.    Victorino Sparrow, LRT/CTRS         Victorino Sparrow A 01/15/2019 11:42 AM

## 2019-01-16 MED ORDER — HALOPERIDOL 20 MG PO TABS: 20.0000 mg | ORAL_TABLET | Freq: Every day | ORAL | 2 refills | Status: DC

## 2019-01-16 MED ORDER — TRAZODONE HCL 50 MG PO TABS: 50.0000 mg | ORAL_TABLET | Freq: Every evening | ORAL | 1 refills | Status: DC | PRN

## 2019-01-16 MED ORDER — TRAZODONE HCL 50 MG PO TABS
50.0000 mg | ORAL_TABLET | Freq: Every evening | ORAL | Status: DC | PRN
  Filled 2019-01-16: qty 7

## 2019-01-16 MED ORDER — ARIPIPRAZOLE ER 400 MG IM SRER
400.0000 mg | INTRAMUSCULAR | Status: DC
  Administered 2019-01-16: 400 mg via INTRAMUSCULAR

## 2019-01-16 MED ORDER — LITHIUM CARBONATE ER 300 MG PO TBCR: 300.0000 mg | EXTENDED_RELEASE_TABLET | Freq: Two times a day (BID) | ORAL | 2 refills | Status: DC

## 2019-01-16 MED ORDER — ARIPIPRAZOLE ER 400 MG IM SRER: 400.0000 mg | INTRAMUSCULAR | 11 refills | Status: DC

## 2019-01-16 MED ORDER — BENZTROPINE MESYLATE 1 MG PO TABS: 1.0000 mg | ORAL_TABLET | Freq: Two times a day (BID) | ORAL | 2 refills | Status: DC

## 2019-01-16 NOTE — Plan of Care (Signed)
Pt was appropriate with groups.    Victorino Sparrow, LRT/CTRS

## 2019-01-16 NOTE — Progress Notes (Signed)
   01/15/19 2015  COVID-19 Daily Checkoff  Have you had a fever (temp > 37.80C/100F)  in the past 24 hours?  No  COVID-19 EXPOSURE  Have you traveled outside the state in the past 14 days? No  Have you been in contact with someone with a confirmed diagnosis of COVID-19 or PUI in the past 14 days without wearing appropriate PPE? No  Have you been living in the same home as a person with confirmed diagnosis of COVID-19 or a PUI (household contact)? No  Have you been diagnosed with COVID-19? No

## 2019-01-16 NOTE — Discharge Summary (Signed)
Physician Discharge Summary Note  Patient:  Justin Chaney is an 23 y.o., male MRN:  [id number] DOB:  Jun 02, 1996 Patient phone:  [phone] (home)  Patient address:   [place] [address],  Total Time spent with patient: 15 minutes  Date of Admission:  01/10/2019 Date of Discharge: 01/16/19  Reason for Admission:  Psychotic symptoms  Principal Problem: Schizoaffective disorder [facility]) Discharge Diagnoses: Principal Problem:   Schizoaffective disorder [facility])   Past Psychiatric History: Hospitalized in February 2020 in [place] for suicidal ideation and psychotic symptoms. History of THC and methamphetamine abuse. Reports history of 4 prior suicide attempts.   Past Medical History:  Past Medical History:  Diagnosis Date  . Asthma   . Bipolar disorder ([place])   . 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 Depression   . Homelessness   . Schizophrenia ([name])   . Seizures ([place])    childhood seizures  . Suicidal behavior   . Suicidal intent     Past Surgical History:  Procedure Laterality Date  . APPENDECTOMY    . SHUNT REMOVAL    . TONSILLECTOMY    . VENTRICULO-PERITONEAL SHUNT PLACEMENT / LAPAROSCOPIC INSERTION PERITONEAL CATHETER     Family History:  Family History  Problem Relation Age of Onset  . Hypertension Mother   . Mental illness Neg Hx    Family Psychiatric  History: Denies Social History:  Social History   Substance and Sexual Activity  Alcohol Use No     Social History   Substance and Sexual Activity  Drug Use No  . Types: Other-see comments, Amphetamines   Comment: History of use    Social History   Socioeconomic History  . Marital status: Single    Spouse name: Not on file  . Number of children: Not on file  . Years of education: Not on file  . Highest education level: Not on file  Occupational History  . Not on file  Social Needs  . Financial resource strain: Not on file  . Food insecurity     Worry: Not on file    Inability: Not on file  . Transportation needs    Medical: Not on file    Non-medical: Not on file  Tobacco Use  . Smoking status: Current Every Day Smoker    Packs/day: 0.00    Types: Cigars  . Smokeless tobacco: Never Used  Substance and Sexual Activity  . Alcohol use: No  . Drug use: No    Types: Other-see comments, Amphetamines    Comment: History of use  . Sexual activity: Not on file  Lifestyle  . Physical activity    Days per week: Not on file    Minutes per session: Not on file  . Stress: Not on file  Relationships  . Social Herbalist on phone: Not on file    Gets together: Not on file    Attends religious service: Not on file    Active member of club or organization: Not on file    Attends meetings of clubs or organizations: Not on file    Relationship status: Not on file  Other Topics Concern  . Not on file  Social History Narrative  . Not on file    Hospital Course:  From admission assessment: Justin Chaney is a 23 y.o. male who was brought to [facility] at the decision of himself and his family due to experiencing AH and VH. Pt has been IVCed.  Pt shares he has been hearing voices coming from the walls, floor, ceiling, cosmos--everywhere--for several years and that over time the voices have gotten worse. He states these voices have been telling him to join an army--pt stated the type of army he was told to join but clinician could not hear/understand him despite asking him to repeat himself numerous times. Pt states he has also been seeing black spots and shadows, which is something that is a newer experience, and states this has also gotten worse, as he's now able to feel these things he sees. Pt states he is not experiencing SI, though he does acknowledge he has attempted to kill himself 4 times previously and that the last attempt was "not too long ago." Pt states he has been hospitalized in the past, though he cannot state when or where;  his records state his most recent hospitalization was several months ago in [place], where pt reported he was traveling to meet the devil but that he needed to stay somewhere safe so he didn't kill himself. Pt states the voices he hears are telling him to "destroy everything," though he has no plan. Pt denies HI. Pt denies current/recent NSSIB, though he states that several years ago he engaged via cutting and cut "down to the bone," requiring 10 stitches. Pt denies engagement in the legal system, access to guns/weapons, or any use of EtOH/illegal substances. When asked who pt has for support, pt answered "the spirits; they've been watching me ever since I was a child." Clinician inquired as to whether these were religious spirits or another kind and pt answered that these were not religious spirits but did not provide additional information.  From admission H&P: This is the first admission here but the second this year for 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 What we do know is that he has a history of being diagnosed with a psychotic disorder treated with lithium and oral Abilify and compliance is unknown he also has a history of cannabis abuse versus dependency and methamphetamine abuse.  He also has a history of self-harm gestures and states he is attempted to kill himself 4 times by cutting himself or overdosing at one point endorsing a very  severe laceration of the arm requiring 10 stitches. We do not have collateral history at this point in time but the assessments are consistent. Drug screen negative on this admission alcohol level negligible. When I interview him he is alert he makes no eye contact he talks about seeing spots and hearing voices "coming from all over" he denies wanting to harm himself now and can contract for safety but reports recently has had these command hallucinations.  He understands what it is to contract.  No involuntary movements.  According to the assessment team again all the histories do seem consistent.  Mr. Schellenberg was admitted for psychotic symptoms. He was started on Haldol, Cogentin, lithium and trazodone. He received Abilify Maintena 400 mg on 01/16/19.  Staff  made several attempts to call patient's mother for collateral information during hospitalization but without response. Mr. Housey remained on the [facility] unit for 6 days. He stabilized with medication and therapy. He was discharged on the medications listed below. He has shown improvement with improved mood, affect, sleep, appetite, and interaction. He denies any SI/HI/AVH and contracts for safety. He agrees to follow up at [facility] (see below). He is provided with prescriptions for medications upon discharge. He is discharging home with family via bus.  Physical Findings: AIMS: Facial and Oral Movements Muscles of Facial Expression: None, normal Lips and Perioral Area: None, normal Jaw: None, normal Tongue: None, normal,Extremity Movements Upper (arms, wrists, hands, fingers): None, normal Lower (legs, knees, ankles, toes): None, normal, Trunk Movements Neck, shoulders, hips: None, normal, Overall Severity Severity of abnormal movements (highest score from questions above): None, normal Incapacitation due to abnormal movements: None, normal Patient's awareness of abnormal movements (rate only patient's report): No Awareness, Dental Status Current  problems with teeth and/or dentures?: No Does patient usually wear dentures?: No  CIWA:    COWS:     Musculoskeletal: Strength & Muscle Tone: within normal limits Gait & Station: normal Patient leans: N/A  Psychiatric Specialty Exam: Physical Exam  Nursing note and vitals reviewed. Constitutional: He is oriented to person, place, and time. He appears well-developed and well-nourished.  Cardiovascular: Normal rate.  Respiratory: Effort normal.  Neurological: He is alert and oriented to person, place, and time.    Review of Systems  Constitutional: Negative.   Respiratory: Negative for cough and shortness of breath.   Cardiovascular: Negative for chest pain.  Gastrointestinal: Negative for diarrhea, nausea and vomiting.  Neurological: Negative for headaches.  Psychiatric/Behavioral: Negative for depression, hallucinations, substance abuse and suicidal ideas. The patient is not nervous/anxious and does not have insomnia.     Blood pressure 106/84, pulse 98, temperature 98.1 F (36.7 C), resp. rate 18, height 5\' 2"  (1.575 m), weight 103.4 kg, SpO2 100 %.Body mass index is 41.7 kg/m.  See MD's discharge SRA     Have you used any form of tobacco in the last 30 days? (Cigarettes, Smokeless Tobacco, Cigars, and/or Pipes): Patient Refused Screening  Has this patient used any form of tobacco in the last 30 days? (Cigarettes, Smokeless Tobacco, Cigars, and/or Pipes)  No  Blood Alcohol level:  Lab Results  Component Value Date   ETH <10 01/09/2019   ETH <10 16/05/9603    Metabolic Disorder Labs:  Lab Results  Component Value Date   HGBA1C 5.0 12/22/2016   MPG 97 12/22/2016   MPG 97 09/09/2015   Lab Results  Component Value Date   PROLACTIN 2.4 (L) 12/22/2016   PROLACTIN 8.0 09/09/2015   Lab Results  Component Value Date   CHOL 130 12/22/2016   TRIG 193 (H) 12/22/2016   HDL 35 (L) 12/22/2016   CHOLHDL 3.7 12/22/2016   VLDL 39 12/22/2016   LDLCALC 56 12/22/2016    LDLCALC 84 09/09/2015    See Psychiatric Specialty Exam and Suicide Risk Assessment completed by Attending Physician prior to discharge.  Discharge destination:  Home  Is patient on multiple antipsychotic therapies at discharge:  Yes,   Do you recommend tapering to monotherapy for antipsychotics?  Yes   Has Patient had three or more failed trials of antipsychotic monotherapy by history:  Yes,   Antipsychotic medications that previously failed include:   1.  Risperdal., 2.  Zyprexa. and 3.  Abilify.  Recommended Plan for Multiple Antipsychotic Therapies:  Taper to monotherapy as described:  Outpatient psychiatrist may taper to antipsychotic monotherapy as patient's symptoms allow.   Allergies as of 01/16/2019      Reactions   Amoxicillin Hives, Other (See Comments)   CHILDHOOD ALLERGY Has patient had a PCN reaction causing immediate rash, facial/tongue/throat swelling, SOB or lightheadedness with hypotension: YES Has patient had a PCN reaction causing severe rash involving mucus membranes or skin necrosis: No Has patient had a PCN reaction that required hospitalization No Has patient had a PCN reaction occurring within the last 10 years: No If all of the above answers are "NO", then may proceed with Cephalosporin use.   Penicillins Hives, Other (See Comments)   CHILDHOOD ALLERGY Has patient had a PCN reaction causing immediate rash, facial/tongue/throat swelling, SOB or lightheadedness with hypotension: No Has patient had a PCN reaction causing severe rash involving mucus membranes or skin necrosis: No Has patient had a PCN reaction that required hospitalization No Has patient had a PCN reaction occurring within the last 10 years: No If all of the above answers are "NO", then may proceed with Cephalosporin use.      Medication List    STOP taking these medications   acetaminophen 325 MG tablet Commonly known as: Tylenol   ARIPiprazole 10 MG tablet Commonly known as:  ABILIFY Replaced by: ARIPiprazole ER 400 MG Srer injection   hydrOXYzine 25 MG tablet Commonly known as: ATARAX/VISTARIL     TAKE these medications     Indication  ARIPiprazole ER 400 MG Srer injection Commonly known as: ABILIFY MAINTENA Inject 2 mLs (400 mg total) into the muscle every 28 (twenty-eight) days. DUE APPROX 7/16 Replaces: ARIPiprazole 10 MG tablet  Indication: MIXED BIPOLAR AFFECTIVE DISORDER   benztropine 1 MG tablet Commonly known as: COGENTIN Take 1 tablet (1 mg total) by mouth 2 (two) times daily.  Indication: Extrapyramidal Reaction caused by Medications   haloperidol 20 MG tablet Commonly known as: HALDOL Take 1 tablet (20 mg total) by mouth at bedtime.  Indication: Psychosis   lithium carbonate 300 MG CR tablet Commonly known as: LITHOBID Take 1 tablet (300 mg total) by mouth every 12 (twelve) hours. What changed:   when to take this  Another medication with the same name was removed. Continue taking this medication, and follow the directions you see here.  Indication: Manic-Depression   nicotine 21 mg/24hr patch Commonly known as: NICODERM CQ - dosed in mg/24 hours Place 1 patch (21 mg total) onto the skin daily. For smoking ceasation  Indication: Nicotine Addiction   traZODone 50 MG tablet Commonly known as: DESYREL Take 1 tablet (50 mg total) by mouth at bedtime as needed for sleep.  Indication: Anxiety Disorder      Follow-up Information    [facility] Follow up on 01/24/2019.   Why: Telephonic hospital follow up appointment is Wednesday, 6/24 at 9:30a.  The provider will contact you the day of the appointment.   Please ask the provider to recommend you to TCT services or you can call the number above and ask to speak with Langley Gauss. Contact information: [address] [place] [phone]           Follow-up recommendations: Activity as tolerated. Diet as recommended by primary care physician. Keep all scheduled  follow-up appointments as recommended.   Comments:   Patient is instructed to take all prescribed medications as recommended. Report any side effects or adverse reactions to your outpatient psychiatrist. Patient is instructed to abstain from alcohol  and illegal drugs while on prescription medications. In the event of worsening symptoms, patient is instructed to call the crisis hotline, 911, or go to the nearest emergency department for evaluation and treatment.  Signed: Connye Burkitt, NP 01/16/2019, 10:04 AM

## 2019-01-16 NOTE — Progress Notes (Signed)
D:  Saber was pleasant and cooperative this evening.  He remained isolative to his room but did come out for evening snack.  He reported the medications are helping and denies SI/HI or A/V hallucinations.  He took his hs medications without difficulty.  He is currently resting with his eyes closed and appears to be asleep. A:  1:1 with RN for support and encouragement.  Medications as ordered.  Q 15 minute checks maintained for safety.  Encouraged participation in group and unit activities.   R:  Goebel remains safe on the unit.  We will continue to monitor the progress towards his goals.

## 2019-01-16 NOTE — Progress Notes (Signed)
Recreation Therapy Notes  Date: 6.16.20 Time:  1000 Location: 500 Hall Dayroom  Group Topic:  Goal Setting  Goal Area(s) Addresses:  Patient will be able to identify at least 3 life goals .  Patient will be able to identify benefit of investing in life goals.  Patient will be able to identify benefit of setting life goals.   Intervention: Worksheet  Activity: Lobbyist.  Patients were given a worksheet that asked patients to set a goal for the week, month, year and 5 years.  Patients were to then identify obstacles they would face, what they need to achieve goals and what they could start doing now to work towards goals.  Education:  Discharge Planning, Coping Skills  Education Outcome: Acknowledges Education/In Group Clarification Provided/Needs Additional Education  Clinical Observations:  Pt did not attend group.   Victorino Sparrow, LRT/CTRS         Ria Comment, Dondrea Clendenin A 01/16/2019 11:30 AM

## 2019-01-16 NOTE — Progress Notes (Signed)
Recreation Therapy Notes  INPATIENT RECREATION TR PLAN  Patient Details Name: Justin Chaney MRN: [id number] DOB: 03/01/1996 Today's Date: 01/16/2019  Rec Therapy Plan Is patient appropriate for Therapeutic Recreation?: Yes Treatment times per week: about 3 days Estimated Length of Stay: 5-7 days TR Treatment/Interventions: Group participation (Comment)  Discharge Criteria Pt will be discharged from therapy if:: Discharged Treatment plan/goals/alternatives discussed and agreed upon by:: Patient/family  Discharge Summary Short term goals set: See patient care plan Short term goals met: Adequate for discharge Progress toward goals comments: Groups attended Which groups?: Coping skills, Self-esteem Reason goals not met: None Therapeutic equipment acquired: N/A Reason patient discharged from therapy: Discharge from hospital Pt/family agrees with progress & goals achieved: Yes Date patient discharged from therapy: 01/16/19    Victorino Sparrow, LRT/CTRS  Ria Comment, Nekeshia Lenhardt A 01/16/2019, 9:26 AM

## 2019-01-16 NOTE — BHH Suicide Risk Assessment (Signed)
[facility] Discharge Suicide Risk Assessment   Principal Problem: Schizoaffective disorder [facility]) Discharge Diagnoses: Principal Problem:   Schizoaffective disorder ([place])   Total Time spent with patient: 45 minutes  Musculoskeletal: Strength & Muscle Tone: within normal limits Gait & Station: normal  Psychiatric Specialty Exam: ROS  Blood pressure 106/84, pulse 98, temperature 98.1 F (36.7 C), resp. rate 18, height 5\' 2"  (1.575 m), weight 103.4 kg, SpO2 100 %.Body mass index is 41.7 kg/m.  General Appearance: Casual  Eye Contact::  Good  Speech:  Clear and Coherent409  Volume:  Normal  Mood:  Euthymic  Affect:  Full Range  Thought Process:  Coherent and Descriptions of Associations: Intact  Orientation:  Full (Time, Place, and Person)  Thought Content:  Tangential  Suicidal Thoughts:  No  Homicidal Thoughts:  No  Memory:  Immediate;   Good  Judgement:  Intact  Insight:  Fair  Psychomotor Activity:  Normal  Concentration:  Good  Recall:  Good  Fund of Knowledge:Good  Language: Good  Akathisia:  Negative  Handed:  Right  AIMS (if indicated):     Assets:  Communication Skills Desire for Improvement  Sleep:  Number of Hours: 6  Cognition: WNL  ADL's:  Intact   Mental Status Per Nursing Assessment::   On Admission:  Suicidal ideation indicated by patient  Demographic Factors:  Male  Loss Factors: NA  Historical Factors:n/a Risk Reduction Factors:   Positive social support and Positive therapeutic relationship  Continued Clinical Symptoms:  Schizophrenia:   Paranoid or undifferentiated type  Cognitive Features That Contribute To Risk:  None    Suicide Risk:  Minimal: No identifiable suicidal ideation.  Patients presenting with no risk factors but with morbid ruminations; may be classified as minimal risk based on the severity of the depressive symptoms  Follow-up Information    [facility] Follow up on 01/24/2019.   Why: Telephonic hospital follow up appointment is  Wednesday, 6/24 at 9:30a.  The provider will contact you the day of the appointment.   Please ask the provider to recommend you to TCT services or you can call the number above and ask to speak with Langley Gauss. Contact information: [address]  [place] [address] [phone]         No current suicidal thoughts plans or intent denies current auditory or visual hallucinations no delusional thought on this exam, is requesting to go back on his aripiprazole long-acting injectable he has not had that in 3 years but no sensitivity to it  Plan Of Care/Follow-up recommendations:  Activity:  full  Stefanny Pieri, MD 01/16/2019, 9:50 AM

## 2019-01-16 NOTE — Progress Notes (Signed)
  [facility] Adult Case Management Discharge Plan :  Will you be returning to the same living situation after discharge:  Yes,  with mom, brother, and sister At discharge, do you have transportation home?: Yes,  bus Do you have the ability to pay for your medications: Yes,  medicaid  Release of information consent forms completed and in the chart;  Patient's signature needed at discharge.  Patient to Follow up at: Follow-up Information    [facility] Follow up on 01/24/2019.   Why: Telephonic hospital follow up appointment is Wednesday, 6/24 at 9:30a.  The provider will contact you the day of the appointment.   Please ask the provider to recommend you to TCT services or you can call the number above and ask to speak with Langley Gauss. Contact information: [place] [phone]           Next level of care provider has access to [place] and Suicide Prevention discussed: No.; CSW tried to contact pt's mother twice.  Have you used any form of tobacco in the last 30 days? (Cigarettes, Smokeless Tobacco, Cigars, and/or Pipes): Patient Refused Screening  Has patient been referred to the Quitline?: Patient refused referral  Patient has been referred for addiction treatment: [place], LCSW 01/16/2019, 10:09 AM

## 2019-01-16 NOTE — Plan of Care (Signed)
  Problem: Education: Goal: Knowledge of [place] [facility] Education information/materials will improve Outcome: Adequate for Discharge Goal: Emotional status will improve Outcome: Adequate for Discharge Goal: Mental status will improve Outcome: Adequate for Discharge Goal: Verbalization of understanding the information provided will improve Outcome: Adequate for Discharge   Problem: Activity: Goal: Interest or engagement in activities will improve Outcome: Adequate for Discharge Goal: Sleeping patterns will improve Outcome: Adequate for Discharge   Problem: Coping: Goal: Ability to verbalize frustrations and anger appropriately will improve Outcome: Adequate for Discharge Goal: Ability to demonstrate self-control will improve Outcome: Adequate for Discharge   Problem: Health Behavior/Discharge Planning: Goal: Identification of resources available to assist in meeting health care needs will improve Outcome: Adequate for Discharge Goal: Compliance with treatment plan for underlying cause of condition will improve Outcome: Adequate for Discharge   Problem: Physical Regulation: Goal: Ability to maintain clinical measurements within normal limits will improve Outcome: Adequate for Discharge   Problem: Safety: Goal: Periods of time without injury will increase Outcome: Adequate for Discharge   Problem: Activity: Goal: Will verbalize the importance of balancing activity with adequate rest periods Outcome: Adequate for Discharge   Problem: Education: Goal: Will be free of psychotic symptoms Outcome: Adequate for Discharge Goal: Knowledge of the prescribed therapeutic regimen will improve Outcome: Adequate for Discharge   Problem: Coping: Goal: Coping ability will improve Outcome: Adequate for Discharge Goal: Will verbalize feelings Outcome: Adequate for Discharge   Problem: Health Behavior/Discharge Planning: Goal: Compliance with prescribed medication regimen will  improve Outcome: Adequate for Discharge   Problem: Nutritional: Goal: Ability to achieve adequate nutritional intake will improve Outcome: Adequate for Discharge   Problem: Role Relationship: Goal: Ability to communicate needs accurately will improve Outcome: Adequate for Discharge Goal: Ability to interact with others will improve Outcome: Adequate for Discharge   Problem: Safety: Goal: Ability to redirect hostility and anger into socially appropriate behaviors will improve Outcome: Adequate for Discharge Goal: Ability to remain free from injury will improve Outcome: Adequate for Discharge   Problem: Self-Care: Goal: Ability to participate in self-care as condition permits will improve Outcome: Adequate for Discharge   Problem: Self-Concept: Goal: Will verbalize positive feelings about self Outcome: Adequate for Discharge   Problem: Education: Goal: Ability to make informed decisions regarding treatment will improve Outcome: Adequate for Discharge   Problem: Coping: Goal: Coping ability will improve Outcome: Adequate for Discharge   Problem: Health Behavior/Discharge Planning: Goal: Identification of resources available to assist in meeting health care needs will improve Outcome: Adequate for Discharge   Problem: Medication: Goal: Compliance with prescribed medication regimen will improve Outcome: Adequate for Discharge   Problem: Self-Concept: Goal: Ability to disclose and discuss suicidal ideas will improve Outcome: Adequate for Discharge Goal: Will verbalize positive feelings about self Outcome: Adequate for Discharge

## 2019-01-16 NOTE — BHH Suicide Risk Assessment (Addendum)
[facility] INPATIENT:  Family/Significant Other Suicide Prevention Education  Suicide Prevention Education:  Contact Attempts: Pt's mother, Stewart Sasaki,  has been identified by the patient as the family member/significant other with whom the patient will be residing, and identified as the person(s) who will aid the patient in the event of a mental health crisis.  With written consent from the patient, two attempts were made to provide suicide prevention education, prior to and/or following the patient's discharge.  We were unsuccessful in providing suicide prevention education.  A suicide education pamphlet was given to the patient to share with family/significant other.  Date and time of first attempt: 06/16 / 9:45am Date and time of second attempt: 06/16 / 10:05am  Trecia Rogers 01/16/2019, 9:50 AM

## 2019-01-16 NOTE — Progress Notes (Signed)
Pt discharged to lobby. Pt was stable and appreciative at that time. All papers, samples and prescriptions were given and valuables returned. Verbal understanding expressed. Denies SI/HI and A/VH. Pt given opportunity to express concerns and ask questions.  

## 2019-03-14 ENCOUNTER — Ambulatory Visit: Payer: Self-pay | Admitting: Family Medicine

## 2019-05-05 ENCOUNTER — Other Ambulatory Visit: Payer: Self-pay

## 2019-05-05 ENCOUNTER — Emergency Department (HOSPITAL_COMMUNITY)
Admission: EM | Admit: 2019-05-05 | Discharge: 2019-05-07 | Disposition: A | Discharge status: Other Acute Inpatient Hospital | Payer: Medicaid Other | Attending: Emergency Medicine | Admitting: Emergency Medicine

## 2019-05-05 ENCOUNTER — Encounter (HOSPITAL_COMMUNITY): Payer: Self-pay | Admitting: Emergency Medicine

## 2019-05-05 DIAGNOSIS — F1410 Cocaine abuse, uncomplicated: Secondary | ICD-10-CM

## 2019-05-05 DIAGNOSIS — F1420 Cocaine dependence, uncomplicated: Secondary | ICD-10-CM | POA: Insufficient documentation

## 2019-05-05 DIAGNOSIS — R45851 Suicidal ideations: Secondary | ICD-10-CM | POA: Insufficient documentation

## 2019-05-05 DIAGNOSIS — J45909 Unspecified asthma, uncomplicated: Secondary | ICD-10-CM | POA: Insufficient documentation

## 2019-05-05 DIAGNOSIS — F250 Schizoaffective disorder, bipolar type: Secondary | ICD-10-CM | POA: Diagnosis present

## 2019-05-05 DIAGNOSIS — F17290 Nicotine dependence, other tobacco product, uncomplicated: Secondary | ICD-10-CM | POA: Diagnosis not present

## 2019-05-05 DIAGNOSIS — Z79899 Other long term (current) drug therapy: Secondary | ICD-10-CM | POA: Insufficient documentation

## 2019-05-05 DIAGNOSIS — Z20828 Contact with and (suspected) exposure to other viral communicable diseases: Secondary | ICD-10-CM | POA: Diagnosis not present

## 2019-05-05 LAB — COMPREHENSIVE METABOLIC PANEL
ALT: 24 U/L (ref 0–44)
AST: 36 U/L (ref 15–41)
Albumin: 4.4 g/dL (ref 3.5–5.0)
Alkaline Phosphatase: 71 U/L (ref 38–126)
Anion gap: 12 (ref 5–15)
BUN: 14 mg/dL (ref 6–20)
CO2: 22 mmol/L (ref 22–32)
Calcium: 9 mg/dL (ref 8.9–10.3)
Chloride: 103 mmol/L (ref 98–111)
Creatinine, Ser: 1.08 mg/dL (ref 0.61–1.24)
GFR calc Af Amer: >60 mL/min (ref >60)
GFR calc non Af Amer: >60 mL/min (ref >60)
Glucose, Bld: 114 mg/dL — ABNORMAL HIGH (ref 70–99)
Potassium: 3.4 mmol/L — ABNORMAL LOW (ref 3.5–5.1)
Sodium: 137 mmol/L (ref 135–145)
Total Bilirubin: 1.4 mg/dL — ABNORMAL HIGH (ref 0.3–1.2)
Total Protein: 7.1 g/dL (ref 6.5–8.1)

## 2019-05-05 LAB — CBC
HCT: 45.1 % (ref 39.0–52.0)
Hemoglobin: 14.9 g/dL (ref 13.0–17.0)
MCH: 28.9 pg (ref 26.0–34.0)
MCHC: 33 g/dL (ref 30.0–36.0)
MCV: 87.6 fL (ref 80.0–100.0)
Platelets: 298 10*3/uL (ref 150–400)
RBC: 5.15 MIL/uL (ref 4.22–5.81)
RDW: 12.4 % (ref 11.5–15.5)
WBC: 10.1 10*3/uL (ref 4.0–10.5)
nRBC: 0 % (ref 0.0–0.2)

## 2019-05-05 LAB — ACETAMINOPHEN LEVEL: Acetaminophen (Tylenol), Serum: <10 ug/mL — ABNORMAL LOW (ref 10–30)

## 2019-05-05 LAB — SALICYLATE LEVEL: Salicylate Lvl: <7 mg/dL (ref 2.8–30.0)

## 2019-05-05 LAB — RAPID URINE DRUG SCREEN, HOSP PERFORMED
Amphetamines: NOT DETECTED
Barbiturates: NOT DETECTED
Benzodiazepines: NOT DETECTED
Cocaine: POSITIVE — AB
Opiates: NOT DETECTED
Tetrahydrocannabinol: NOT DETECTED

## 2019-05-05 LAB — ETHANOL: Alcohol, Ethyl (B): <10 mg/dL (ref <10)

## 2019-05-05 NOTE — ED Notes (Signed)
Greg placed belongings in locker #2

## 2019-05-05 NOTE — ED Notes (Signed)
Blood work collected prior to arrival to bed
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
## 2019-05-05 NOTE — ED Notes (Signed)
Pt changed into paper scrubs, belongings bagged and labeled, and security called to wand pt.

## 2019-05-05 NOTE — ED Triage Notes (Signed)
C/o suicidal ideation with a plan to hang himself.  Also requesting detox from cocaine.

## 2019-05-06 LAB — LITHIUM LEVEL: Lithium Lvl: <0.06 mmol/L — ABNORMAL LOW (ref 0.60–1.20)

## 2019-05-06 LAB — SARS CORONAVIRUS 2 BY RT PCR (HOSPITAL ORDER, PERFORMED IN ~~LOC~~ HOSPITAL LAB): SARS Coronavirus 2: NEGATIVE

## 2019-05-06 MED ORDER — HALOPERIDOL 5 MG PO TABS
5.0000 mg | ORAL_TABLET | Freq: Every day | ORAL | Status: DC
  Administered 2019-05-06 (×2): 5 mg via ORAL
  Filled 2019-05-06 (×2): qty 1

## 2019-05-06 MED ORDER — TRAZODONE HCL 50 MG PO TABS: 50.0000 mg | ORAL_TABLET | Freq: Every evening | ORAL | Status: DC | PRN

## 2019-05-06 MED ORDER — LORAZEPAM 1 MG PO TABS: 1.0000 mg | ORAL_TABLET | Freq: Four times a day (QID) | ORAL | Status: DC | PRN

## 2019-05-06 MED ORDER — POTASSIUM CHLORIDE CRYS ER 20 MEQ PO TBCR
40.0000 meq | EXTENDED_RELEASE_TABLET | Freq: Once | ORAL | Status: AC
  Administered 2019-05-06: 40 meq via ORAL
  Filled 2019-05-06: qty 2

## 2019-05-06 MED ORDER — LITHIUM CARBONATE ER 300 MG PO TBCR
300.0000 mg | EXTENDED_RELEASE_TABLET | Freq: Two times a day (BID) | ORAL | Status: DC
  Administered 2019-05-06 – 2019-05-07 (×4): 300 mg via ORAL
  Filled 2019-05-06 (×7): qty 1

## 2019-05-06 MED ORDER — NICOTINE 21 MG/24HR TD PT24
21.0000 mg | MEDICATED_PATCH | Freq: Every day | TRANSDERMAL | Status: DC
  Filled 2019-05-06 (×2): qty 1

## 2019-05-06 MED ORDER — BENZTROPINE MESYLATE 1 MG PO TABS
1.0000 mg | ORAL_TABLET | Freq: Two times a day (BID) | ORAL | Status: DC
  Administered 2019-05-06 – 2019-05-07 (×4): 1 mg via ORAL
  Filled 2019-05-06 (×4): qty 1

## 2019-05-06 NOTE — ED Notes (Signed)
Upon assessment pt reports hearing voices. Per pt. Voice are telling him "all kinds of ways to kill himself." Pt. States voices tell him to hang himself, stab, and different ways to hurt himself. Pt denies HI. Pt. States he does not intend to follow commands and does not wish to die. Pt is calm, cooperative, and pleasant. Pt. In hallway near nurses station. Pt in purple scrubs. All belonging removed by previous RN. Pt. wanded by security.

## 2019-05-06 NOTE — ED Notes (Signed)
Family at bedside. 

## 2019-05-06 NOTE — ED Notes (Signed)
This RN spoke w/pt - advised tx plan - accepted to [facility] and may be transported today after 1800. Pt voiced understanding and agreement w/tx plan. Advised he agrees to sign himself in as stay he wants Inpt help. [place], O.V. [phone] - advised of pt's agreement. Advised will accept pt as voluntary and may arrive after 1800.

## 2019-05-06 NOTE — Progress Notes (Signed)
Patient meets criteria for inpatient treatment. Per Colin Broach, there are no appropriate beds available at [facility] currently. CSW faxed referrals to the following facilities for review:  [facility], [facility], [facility], [facility], [facility], [facility], [place] [facility], [facility], [facility], [facility], [facility].  TTS will continue to seek bed placement.   Maxie Better, MSW, LCSW Clinical Social Worker 05/06/2019 9:26 AM

## 2019-05-06 NOTE — ED Notes (Signed)
[facility] called inquiring about pt.

## 2019-05-06 NOTE — ED Notes (Signed)
Lunch tray ordered 

## 2019-05-06 NOTE — ED Notes (Signed)
Ordered breakfast--Justin Chaney 

## 2019-05-06 NOTE — BHH Counselor (Signed)
05/06/2019: Pt woke up, he reported continued suicidal ideation and anxiety.  Pt stated that he did not have auditory hallucinations at the moment, but that is his experience.  10//10/2018 assessment:  23 y.o. single male who presents unaccompanied to [facility] ED reporting symptoms of depression, hallucinations, suicidal ideation and cocaine use. Pt has a history of schizoaffective disorder and multiple presentations to locals EDs with mental health symptoms. Pt reports he feels severely depressed and acknowledges symptoms including crying spells, social withdrawal, loss of interest in usual pleasures, fatigue, irritability, decreased concentration, decreased sleep, decreased appetite and feelings of guilt, worthlessness and hopelessness. He reports current suicidal ideation with plan to hang himself. He says he feels alone. He says he has attempted suicide several times in the past by cutting his throat and wrists. He reports hearing voices telling him to kill himself and fears he will act on these voices. Pt says "I don't feel like I should live." He reports seeing "spirits" and "dark shadows." He denies homicidal ideation or history of violence.

## 2019-05-06 NOTE — ED Provider Notes (Signed)
[facility] EMERGENCY DEPARTMENT Provider Note   CSN: [id number] Arrival date & time: 05/05/19  2220     History   Chief Complaint Chief Complaint  Patient presents with  . Suicidal    HPI Justin Chaney is a 23 y.o. male.     The history is provided by the patient.  Mental Health Problem Presenting symptoms: suicidal thoughts   Degree of incapacity (severity):  Severe Onset quality:  Sudden Timing:  Constant Progression:  Worsening Chronicity:  New Context: drug abuse   Relieved by:  None tried Worsened by:  Nothing Associated symptoms: no abdominal pain, no chest pain and no headaches    Patient with history of asthma bipolar disorder presents with suicidal thoughts.  He reports he is thought of hanging himself. He admits to abusing cocaine. He has no other acute complaints at this time. Past Medical History:  Diagnosis Date  . Asthma   . Bipolar disorder ([place])   . 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 Depression   . Homelessness   . Schizophrenia ([place])   . Seizures ([place])    childhood seizures  . Suicidal behavior   . Suicidal intent     Patient Active Problem List   Diagnosis Date Noted  . Schizoaffective disorder ([place]) 01/10/2019  . Cocaine use disorder, severe, in early remission ([place]) 12/21/2016  . 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 ADHD (attention deficit hyperactivity disorder) 12/21/2016  . 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 Tobacco use disorder 12/21/2016  . Tobacco use 10/06/2016  . Homicidal ideations 09/16/2016  . Suicidal ideation   . Hallucinations   . Homicidal ideation   . Suicidal thoughts   . Cannabis use disorder, moderate, in sustained remission ([place]) 11/07/2015  . Depression 10/29/2015  . Intermittent explosive disorder 09/05/2015  . Schizoaffective disorder, bipolar type ([place]) 09/04/2015  . Malingering 06/05/2015  . Antisocial personality disorder ([place]) 06/03/2015  .  Cocaine abuse ([place]) 06/03/2015    Past Surgical History:  Procedure Laterality Date  . APPENDECTOMY    . SHUNT REMOVAL    . TONSILLECTOMY    . VENTRICULO-PERITONEAL SHUNT PLACEMENT / LAPAROSCOPIC INSERTION PERITONEAL CATHETER          Home Medications    Prior to Admission medications   Medication Sig Start Date End Date Taking? Authorizing Provider  ARIPiprazole ER (ABILIFY MAINTENA) 400 MG SRER injection Inject 2 mLs (400 mg total) into the muscle every 28 (twenty-eight) days. DUE APPROX 7/16 01/16/19   [name], MD  benztropine (COGENTIN) 1 MG tablet Take 1 tablet (1 mg total) by mouth 2 (two) times daily. 01/16/19   [name], MD  haloperidol (HALDOL) 20 MG tablet Take 1 tablet (20 mg total) by mouth at bedtime. 01/16/19   [name], MD  lithium carbonate (LITHOBID) 300 MG CR tablet Take 1 tablet (300 mg total) by mouth every 12 (twelve) hours. 01/16/19   [name], MD  nicotine (NICODERM CQ - DOSED IN MG/24 HOURS) 21 mg/24hr patch Place 1 patch (21 mg total) onto the skin daily. For smoking ceasation Patient not taking: Reported on 03/08/2018 12/31/16   Hughie Closs A, NP  traZODone (DESYREL) 50 MG tablet Take 1 tablet (50 mg total) by mouth at bedtime as needed for sleep. 01/16/19   [name], MD    Family History Family History  Problem Relation Age of Onset  . Hypertension Mother   . Mental illness Neg Hx  Social History Social History   Tobacco Use  . Smoking status: Current Every Day Smoker    Packs/day: 0.00    Types: Cigars  . Smokeless tobacco: Never Used  Substance Use Topics  . Alcohol use: No  . Drug use: No    Types: Other-see comments, Amphetamines    Comment: History of use     Allergies   Amoxicillin and Penicillins   Review of Systems Review of Systems  Constitutional: Negative for fever.  Cardiovascular: Negative for chest pain.  Gastrointestinal: Negative for abdominal pain.  Neurological: Negative for headaches.   Psychiatric/Behavioral: Positive for suicidal ideas.  All other systems reviewed and are negative.    Physical Exam Updated Vital Signs BP 130/85   Pulse 95   Temp 98.2 F (36.8 C) (Oral)   Resp 16   SpO2 96%   Physical Exam  CONSTITUTIONAL: Disheveled, no acute distress HEAD: Normocephalic/atraumatic EYES: EOMI ENMT: Mucous membranes moist NECK: supple no meningeal signs CV: S1/S2 noted, no murmurs/rubs/gallops noted LUNGS: Lungs are clear to auscultation bilaterally, no apparent distress ABDOMEN: soft, nontender NEURO: Pt is awake/alert/appropriate, moves all extremitiesx4.  No facial droop.   EXTREMITIES: pulses normal/equal, full ROM, no wounds or abscesses noted either arm SKIN: warm, color normal PSYCH: no abnormalities of mood noted, alert and oriented to situation  ED Treatments / Results  Labs (all labs ordered are listed, but only abnormal results are displayed) Labs Reviewed  COMPREHENSIVE METABOLIC PANEL - Abnormal; Notable for the following components:      Result Value   Potassium 3.4 (*)    Glucose, Bld 114 (*)    Total Bilirubin 1.4 (*)    All other components within normal limits  ACETAMINOPHEN LEVEL - Abnormal; Notable for the following components:   Acetaminophen (Tylenol), Serum <10 (*)    All other components within normal limits  RAPID URINE DRUG SCREEN, HOSP PERFORMED - Abnormal; Notable for the following components:   Cocaine POSITIVE (*)    All other components within normal limits  ETHANOL  SALICYLATE LEVEL  CBC  LITHIUM LEVEL    EKG None  Radiology No results found.  Procedures Procedures  Medications Ordered in ED Medications  LORazepam (ATIVAN) tablet 1 mg (has no administration in time range)  benztropine (COGENTIN) tablet 1 mg (has no administration in time range)  haloperidol (HALDOL) tablet 5 mg (has no administration in time range)  lithium carbonate (LITHOBID) CR tablet 300 mg (has no administration in time range)   traZODone (DESYREL) tablet 50 mg (has no administration in time range)  nicotine (NICODERM CQ - dosed in mg/24 hours) patch 21 mg (has no administration in time range)     Initial Impression / Assessment and Plan / ED Course  I have reviewed the triage vital signs and the nursing notes.  Pertinent labs results that were available during my care of the patient were reviewed by me and considered in my medical decision making (see chart for details).        12:31 AM Pt medically stable Will obtain lithium level, start meds and will consult psych Pt awake/alert, no distress, eating a sandwich   Final Clinical Impressions(s) / ED Diagnoses   Final diagnoses:  Suicidal ideation  Cocaine abuse [facility])    ED Discharge Orders    None       Ripley Fraise, MD 05/06/19 [phone]

## 2019-05-06 NOTE — ED Notes (Signed)
Called safe transport to pick up

## 2019-05-06 NOTE — ED Notes (Signed)
Secure message sent to pharmacy regards to missing Lithobid medication

## 2019-05-06 NOTE — ED Notes (Signed)
Attempted to call report to accepting facility at this time.  Receiving RN to call back.

## 2019-05-06 NOTE — Progress Notes (Addendum)
Per Shante in admissions at [facility], pt has been accepted for admission (after 6pm today) to [place]. Accepting provider: Dr. Elaina Hoops. Number for report: [phone]. CSW provided Rise Mu, RN with above information.  Kebin Maye S. Ouida Sills, MSW, LCSW Clinical Social Worker 05/06/2019 11:09 AM    Shante from admissions called back to request that pt be IVCed for safe transport due to ongoing SI and hallucinations and that IVC paperwork be faxed prior to transfer this evening. Becky RN notified. CSW contacted Shante to notify her that sheriff's dept will not transport until tomorrow. Doylene Bode stated that pt can arrive anytime after 8am tomorrow, 10/5.   Davinder Haff S. Ouida Sills, MSW, LCSW Clinical Social Worker 05/06/2019 11:16 AM

## 2019-05-07 NOTE — ED Notes (Signed)
Patient eating lunch tray.

## 2019-05-07 NOTE — ED Notes (Addendum)
Attempted to call report to Ellin Mayhew, this RN left her name/contact number.

## 2019-05-07 NOTE — ED Notes (Signed)
Ordered bfast 

## 2019-05-07 NOTE — ED Notes (Signed)
Regular lunch tray ordered 

## 2019-07-22 ENCOUNTER — Other Ambulatory Visit: Payer: Self-pay

## 2019-07-22 ENCOUNTER — Emergency Department (HOSPITAL_COMMUNITY)
Admission: EM | Admit: 2019-07-22 | Discharge: 2019-07-22 | Disposition: A | Discharge status: Other Acute Inpatient Hospital | Payer: Medicaid Other | Attending: Emergency Medicine | Admitting: Emergency Medicine

## 2019-07-22 DIAGNOSIS — F17290 Nicotine dependence, other tobacco product, uncomplicated: Secondary | ICD-10-CM | POA: Diagnosis not present

## 2019-07-22 DIAGNOSIS — Z590 Homelessness: Secondary | ICD-10-CM | POA: Diagnosis not present

## 2019-07-22 DIAGNOSIS — R45851 Suicidal ideations: Secondary | ICD-10-CM | POA: Insufficient documentation

## 2019-07-22 DIAGNOSIS — F1520 Other stimulant dependence, uncomplicated: Secondary | ICD-10-CM | POA: Diagnosis not present

## 2019-07-22 DIAGNOSIS — F250 Schizoaffective disorder, bipolar type: Secondary | ICD-10-CM | POA: Diagnosis not present

## 2019-07-22 DIAGNOSIS — J45909 Unspecified asthma, uncomplicated: Secondary | ICD-10-CM | POA: Diagnosis not present

## 2019-07-22 DIAGNOSIS — Z008 Encounter for other general examination: Secondary | ICD-10-CM | POA: Insufficient documentation

## 2019-07-22 DIAGNOSIS — F1120 Opioid dependence, uncomplicated: Secondary | ICD-10-CM | POA: Insufficient documentation

## 2019-07-22 DIAGNOSIS — F1910 Other psychoactive substance abuse, uncomplicated: Secondary | ICD-10-CM | POA: Diagnosis present

## 2019-07-22 DIAGNOSIS — Z79899 Other long term (current) drug therapy: Secondary | ICD-10-CM | POA: Diagnosis not present

## 2019-07-22 DIAGNOSIS — Z20828 Contact with and (suspected) exposure to other viral communicable diseases: Secondary | ICD-10-CM | POA: Diagnosis not present

## 2019-07-22 LAB — COMPREHENSIVE METABOLIC PANEL
ALT: 37 U/L (ref 0–44)
AST: 48 U/L — ABNORMAL HIGH (ref 15–41)
Albumin: 3.9 g/dL (ref 3.5–5.0)
Alkaline Phosphatase: 99 U/L (ref 38–126)
Anion gap: 11 (ref 5–15)
BUN: <5 mg/dL — ABNORMAL LOW (ref 6–20)
CO2: 28 mmol/L (ref 22–32)
Calcium: 9 mg/dL (ref 8.9–10.3)
Chloride: 97 mmol/L — ABNORMAL LOW (ref 98–111)
Creatinine, Ser: 0.85 mg/dL (ref 0.61–1.24)
GFR calc Af Amer: >60 mL/min (ref >60)
GFR calc non Af Amer: >60 mL/min (ref >60)
Glucose, Bld: 101 mg/dL — ABNORMAL HIGH (ref 70–99)
Potassium: 3.6 mmol/L (ref 3.5–5.1)
Sodium: 136 mmol/L (ref 135–145)
Total Bilirubin: 0.7 mg/dL (ref 0.3–1.2)
Total Protein: 7.1 g/dL (ref 6.5–8.1)

## 2019-07-22 LAB — CBC
HCT: 45.8 % (ref 39.0–52.0)
Hemoglobin: 14.8 g/dL (ref 13.0–17.0)
MCH: 27.7 pg (ref 26.0–34.0)
MCHC: 32.3 g/dL (ref 30.0–36.0)
MCV: 85.6 fL (ref 80.0–100.0)
Platelets: 296 10*3/uL (ref 150–400)
RBC: 5.35 MIL/uL (ref 4.22–5.81)
RDW: 12.2 % (ref 11.5–15.5)
WBC: 9.4 10*3/uL (ref 4.0–10.5)
nRBC: 0 % (ref 0.0–0.2)

## 2019-07-22 LAB — RAPID URINE DRUG SCREEN, HOSP PERFORMED
Amphetamines: POSITIVE — AB
Barbiturates: NOT DETECTED
Benzodiazepines: NOT DETECTED
Cocaine: NOT DETECTED
Opiates: NOT DETECTED
Tetrahydrocannabinol: NOT DETECTED

## 2019-07-22 LAB — RESPIRATORY PANEL BY RT PCR (FLU A&B, COVID)
Influenza A by PCR: NEGATIVE
Influenza B by PCR: NEGATIVE
SARS Coronavirus 2 by RT PCR: NEGATIVE

## 2019-07-22 LAB — ETHANOL: Alcohol, Ethyl (B): <10 mg/dL (ref <10)

## 2019-07-22 LAB — LITHIUM LEVEL: Lithium Lvl: 0.06 mmol/L — ABNORMAL LOW (ref 0.60–1.20)

## 2019-07-22 MED ORDER — TRAZODONE HCL 50 MG PO TABS
50.0000 mg | ORAL_TABLET | Freq: Every evening | ORAL | Status: DC | PRN
  Administered 2019-07-22: 04:00:00 50 mg via ORAL
  Filled 2019-07-22: qty 1

## 2019-07-22 MED ORDER — BENZTROPINE MESYLATE 1 MG PO TABS
1.0000 mg | ORAL_TABLET | Freq: Two times a day (BID) | ORAL | Status: DC
  Administered 2019-07-22: 1 mg via ORAL
  Filled 2019-07-22: qty 1

## 2019-07-22 MED ORDER — LITHIUM CARBONATE ER 300 MG PO TBCR
300.0000 mg | EXTENDED_RELEASE_TABLET | Freq: Two times a day (BID) | ORAL | Status: DC
  Administered 2019-07-22: 04:00:00 300 mg via ORAL
  Filled 2019-07-22: qty 1

## 2019-07-22 MED ORDER — ARIPIPRAZOLE LAUROXIL ER 662 MG/2.4ML IM PRSY: 662.0000 mg | PREFILLED_SYRINGE | INTRAMUSCULAR | Status: DC

## 2019-07-22 MED ORDER — HALOPERIDOL 5 MG PO TABS: 20.0000 mg | ORAL_TABLET | Freq: Every day | ORAL | Status: DC

## 2019-07-22 NOTE — ED Notes (Signed)
Sitter leaving at this time Chief Financial Officer does not have another Chartered certified accountant. Pt being monitored by staff.

## 2019-07-22 NOTE — ED Notes (Signed)
Pt ambulated into [facility] for teleassessment

## 2019-07-22 NOTE — ED Notes (Signed)
Pt moved to hall 21 for patient and staff safety, no sitter available at this time, multiple safety concerns in room.

## 2019-07-22 NOTE — ED Notes (Signed)
report given to Wells Guiles, [place]
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
## 2019-07-22 NOTE — ED Notes (Signed)
Breakfast ordered 

## 2019-07-22 NOTE — ED Provider Notes (Signed)
23 year old male with extensive psychiatric history including suicide attempts and polysubstance abuse presents with last night with attempted overdose on heroin and methamphetamines.  Patient seen, evaluated, and medically cleared.  Patient accepted to psychiatric facility for further treatment and evaluation.  I have reviewed the patient's vital signs, labs, and reassessed him and he appears stable for transfer   Pattricia Boss, MD 07/22/19 1433

## 2019-07-22 NOTE — ED Notes (Signed)
Pt arrived to Rm 49 via stretcher. Pt noted to be sleeping - eyes closed. Respirations even, unlabored. Pt noted to be wearing burgundy scrubs. Sitter w/pt.

## 2019-07-22 NOTE — ED Notes (Signed)
Attempted to waken pt so may be moved to Rm 49. Pt wakens then lays back down on stretcher and closes his eyes. Breakfast tray delivered. Sitter w/pt.

## 2019-07-22 NOTE — ED Notes (Signed)
Pt belongings inventoried and secured in locker number 2.

## 2019-07-22 NOTE — ED Notes (Signed)
Attempted to call report to Sharlene Motts - [phone] - was advised RN will return call.

## 2019-07-22 NOTE — ED Triage Notes (Signed)
Pt said he has been feeling like he wanted to kill himself for about 3 days and has been doing a lot of drugs. Pt said he has been doing meth and heroin and wish he could have overdosed on them.

## 2019-07-22 NOTE — ED Notes (Signed)
Pt now awake - ambulated to bathroom and back to room w/o difficulty. Pt eating lunch. Pt voiced understanding and agreement w/tx plan - accepted to [facility].

## 2019-07-22 NOTE — ED Notes (Signed)
Woke pt so may eat lunch and advised accepted [facility] - pt woke for very brief moment then returned to sleeping.

## 2019-07-22 NOTE — ED Notes (Addendum)
[facility] transporting pt to [facility] See note - all belongings given to [facility] - Pt aware.

## 2019-07-22 NOTE — ED Notes (Signed)
Pt refused VS at this time .

## 2019-07-22 NOTE — ED Notes (Signed)
Staffing called for sitter.   

## 2019-07-22 NOTE — Progress Notes (Signed)
Pt accepted to [facility] Dr. Domingo Madeira is the accepting provider.  Call report to [phone] Jacqlyn Larsen, RN @ [facility] ED notified.   Pt is Voluntary  Pt may be transported by [place] may arrive to facility as soon as transport is set up.   Darletta Moll MSW, [place] Worker Disposition  [facility] Ph: [phone] Fax: [phone]  07/22/2019 11:01 AM

## 2019-07-22 NOTE — ED Notes (Signed)
ALL belongings - 2 labeled belongings bags and 1 valuables envelope - [facility] - Pt aware.

## 2019-07-22 NOTE — ED Provider Notes (Signed)
[facility] EMERGENCY DEPARTMENT Provider Note  CSN: [id number] Arrival date & time: 07/22/19 0004  Chief Complaint(s) Medical Clearance and Suicidal  HPI Justin Chaney is a 23 y.o. male with extensive psychiatric past medical history listed below including prior suicide attempts polysubstance abuse.  Patient also homeless.  He presents here for reported suicide attempt by overdosing on heroin and methamphetamine.  States that he is shooting up.  Reports that he died 3 times today.  States that he has been trying to kill himself for several days.  Reports that he does not care to live anymore.  Denies any homicidal ideations or AVH.  Denies any physical complaints.  HPI  Past Medical History Past Medical History:  Diagnosis Date  . Asthma   . Bipolar disorder ([place])   . 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 Depression   . Homelessness   . Schizophrenia ([place])   . Seizures ([place])    childhood seizures  . Suicidal behavior   . Suicidal intent    Patient Active Problem List   Diagnosis Date Noted  . Schizoaffective disorder ([place]) 01/10/2019  . Cocaine use disorder, severe, in early remission ([place]) 12/21/2016  . 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 ADHD (attention deficit hyperactivity disorder) 12/21/2016  . 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 Tobacco use disorder 12/21/2016  . Tobacco use 10/06/2016  . Homicidal ideations 09/16/2016  . Suicidal ideation   . Hallucinations   . Homicidal ideation   . Suicidal thoughts   . Cannabis use disorder, moderate, in sustained remission ([place]) 11/07/2015  . Depression 10/29/2015  . Intermittent explosive disorder 09/05/2015  . Schizoaffective disorder, bipolar type ([place]) 09/04/2015  . Malingering 06/05/2015  . Antisocial personality disorder ([place]) 06/03/2015  . Cocaine abuse ([place]) 06/03/2015   Home Medication(s) Prior to Admission medications   Medication Sig Start Date End Date  Taking? Authorizing Provider  ARIPiprazole ER (ABILIFY MAINTENA) 400 MG SRER injection Inject 2 mLs (400 mg total) into the muscle every 28 (twenty-eight) days. DUE APPROX 7/16 Patient taking differently: Inject 400 mg into the muscle every 28 (twenty-eight) days.  01/16/19   [name], MD  ARIPiprazole Lauroxil ER (ARISTADA) 662 MG/2.4ML PRSY Inject 662 mg into the muscle every 28 (twenty-eight) days.    [provider]  benztropine (COGENTIN) 1 MG tablet Take 1 tablet (1 mg total) by mouth 2 (two) times daily. 01/16/19   [name], MD  haloperidol (HALDOL) 20 MG tablet Take 1 tablet (20 mg total) by mouth at bedtime. 01/16/19   [name], MD  lithium carbonate (LITHOBID) 300 MG CR tablet Take 1 tablet (300 mg total) by mouth every 12 (twelve) hours. 01/16/19   [name], MD  traZODone (DESYREL) 50 MG tablet Take 1 tablet (50 mg total) by mouth at bedtime as needed for sleep. 01/16/19   [name], MD  Past Surgical History Past Surgical History:  Procedure Laterality Date  . APPENDECTOMY    . SHUNT REMOVAL    . TONSILLECTOMY    . VENTRICULO-PERITONEAL SHUNT PLACEMENT / LAPAROSCOPIC INSERTION PERITONEAL CATHETER     Family History Family History  Problem Relation Age of Onset  . Hypertension Mother   . Mental illness Neg Hx     Social History Social History   Tobacco Use  . Smoking status: Current Every Day Smoker    Packs/day: 0.00    Types: Cigars  . Smokeless tobacco: Never Used  Substance Use Topics  . Alcohol use: No  . Drug use: No    Types: Other-see comments, Amphetamines    Comment: History of use   Allergies Amoxicillin and Penicillins  Review of Systems Review of Systems All other systems are reviewed and are negative for acute change except as noted in the HPI  Physical Exam Vital Signs  I have reviewed the  triage vital signs BP (!) 120/57 (BP Location: Right Arm)   Pulse 84   Temp 98.7 F (37.1 C) (Oral)   Resp 18   SpO2 94%   Physical Exam Vitals reviewed.  Constitutional:      General: He is not in acute distress.    Appearance: He is well-developed. He is not diaphoretic.  HENT:     Head: Normocephalic and atraumatic.     Nose: Nose normal.  Eyes:     General: No scleral icterus.       Right eye: No discharge.        Left eye: No discharge.     Conjunctiva/sclera: Conjunctivae normal.     Pupils: Pupils are equal, round, and reactive to light.  Cardiovascular:     Rate and Rhythm: Normal rate and regular rhythm.     Heart sounds: No murmur. No friction rub. No gallop.   Pulmonary:     Effort: Pulmonary effort is normal. No respiratory distress.     Breath sounds: Normal breath sounds. No stridor. No rales.  Abdominal:     General: There is no distension.     Palpations: Abdomen is soft.     Tenderness: There is no abdominal tenderness.  Musculoskeletal:        General: No tenderness.     Cervical back: Normal range of motion and neck supple.  Skin:    General: Skin is warm and dry.     Findings: Wound (to BUE. no surrounding erythema or discharge ) present. No erythema, lesion or rash.  Neurological:     Mental Status: He is alert and oriented to person, place, and time.     ED Results and Treatments Labs (all labs ordered are listed, but only abnormal results are displayed) Labs Reviewed  COMPREHENSIVE METABOLIC PANEL - Abnormal; Notable for the following components:      Result Value   Chloride 97 (*)    Glucose, Bld 101 (*)    BUN <5 (*)    AST 48 (*)    All other components within normal limits  RAPID URINE DRUG SCREEN, HOSP PERFORMED - Abnormal; Notable for the following components:   Amphetamines POSITIVE (*)    All other components within normal limits  RESPIRATORY PANEL BY RT PCR (FLU A&B, COVID)  ETHANOL  CBC  LITHIUM LEVEL  EKG  EKG Interpretation  Date/Time:    Ventricular Rate:    PR Interval:    QRS Duration:   QT Interval:    QTC Calculation:   R Axis:     Text Interpretation:        Radiology No results found.  Pertinent labs & imaging results that were available during my care of the patient were reviewed by me and considered in my medical decision making (see chart for details).  Medications Ordered in ED Medications  ARIPiprazole Lauroxil ER PRSY 662 mg (has no administration in time range)  haloperidol (HALDOL) tablet 20 mg (has no administration in time range)  benztropine (COGENTIN) tablet 1 mg (has no administration in time range)  lithium carbonate (LITHOBID) CR tablet 300 mg (has no administration in time range)  traZODone (DESYREL) tablet 50 mg (has no administration in time range)                                                                                                                                    Procedures Procedures  (including critical care time)  Medical Decision Making / ED Course I have reviewed the nursing notes for this encounter and the patient's prior records (if available in EHR or on provided paperwork).   Clarnece Dort was evaluated in Emergency Department on 07/22/2019 for the symptoms described in the history of present illness. He was evaluated in the context of the global COVID-19 pandemic, which necessitated consideration that the patient might be at risk for infection with the SARS-CoV-2 virus that causes COVID-19. Institutional protocols and algorithms that pertain to the evaluation of patients at risk for COVID-19 are in a state of rapid change based on information released by regulatory bodies including the CDC and federal and state organizations. These policies and algorithms were followed during the patient's care in the ED.  Patient presents for  reported suicide attempt by overdosing on methamphetamine and heroin.  He is well-appearing and well-hydrated, nontoxic.  Labs grossly reassuring.  Medically clear for behavioral health evaluation.      Final Clinical Impression(s) / ED Diagnoses Final diagnoses:  Suicidal ideations  Polysubstance abuse ([place])      This chart was dictated using voice recognition software.  Despite best efforts to proofread,  errors can occur which can change the documentation meaning.   Fatima Blank, MD 07/22/19 [phone]

## 2019-07-22 NOTE — ED Notes (Signed)
Pt adamantly refusing covid swab, when asked to sign valuables envelope pt drew lines on paper.  CN made aware of refusal for COVID, pt unable to move to purple zone until results obtained from swab.  MD made aware

## 2019-07-22 NOTE — ED Notes (Signed)
Pt very upset, pacing in room, pt cursing and upset with staff.  Pt states he has nothing to say to me stating "get the fuck out of my room" pt very agitated, [employer] and security bedside.

## 2019-07-22 NOTE — Progress Notes (Signed)
Patient meets criteria for inpatient treatment per [name], NP. No appropriate beds at [facility] currently. CSW faxed referrals to the following facilities for review:  [facility]  [facility]    TTS will continue to seek bed placement.     Darletta Moll MSW, [place] Worker Disposition  [facility] Ph: [phone] Fax: [phone] 07/22/2019 9:43 AM

## 2019-08-10 ENCOUNTER — Encounter (HOSPITAL_COMMUNITY): Payer: Self-pay | Admitting: Emergency Medicine

## 2019-08-10 ENCOUNTER — Emergency Department (HOSPITAL_COMMUNITY): Payer: Medicaid Other

## 2019-08-10 ENCOUNTER — Inpatient Hospital Stay (HOSPITAL_COMMUNITY)
Admission: EM | Admit: 2019-08-10 | Discharge: 2019-09-10 | DRG: 871 | Discharge status: Against Medical Advice | Payer: Medicaid Other | Attending: Family Medicine | Admitting: Family Medicine

## 2019-08-10 ENCOUNTER — Other Ambulatory Visit: Payer: Self-pay

## 2019-08-10 DIAGNOSIS — R7881 Bacteremia: Secondary | ICD-10-CM

## 2019-08-10 DIAGNOSIS — J45909 Unspecified asthma, uncomplicated: Secondary | ICD-10-CM | POA: Diagnosis present

## 2019-08-10 DIAGNOSIS — Z9119 Patient's noncompliance with other medical treatment and regimen: Secondary | ICD-10-CM

## 2019-08-10 DIAGNOSIS — J15211 Pneumonia due to Methicillin susceptible Staphylococcus aureus: Secondary | ICD-10-CM | POA: Diagnosis present

## 2019-08-10 DIAGNOSIS — J189 Pneumonia, unspecified organism: Secondary | ICD-10-CM

## 2019-08-10 DIAGNOSIS — G4700 Insomnia, unspecified: Secondary | ICD-10-CM | POA: Diagnosis present

## 2019-08-10 DIAGNOSIS — F17290 Nicotine dependence, other tobacco product, uncomplicated: Secondary | ICD-10-CM | POA: Diagnosis present

## 2019-08-10 DIAGNOSIS — R05 Cough: Secondary | ICD-10-CM

## 2019-08-10 DIAGNOSIS — E872 Acidosis: Secondary | ICD-10-CM | POA: Diagnosis present

## 2019-08-10 DIAGNOSIS — R059 Cough, unspecified: Secondary | ICD-10-CM

## 2019-08-10 DIAGNOSIS — D696 Thrombocytopenia, unspecified: Secondary | ICD-10-CM | POA: Diagnosis present

## 2019-08-10 DIAGNOSIS — Z79899 Other long term (current) drug therapy: Secondary | ICD-10-CM

## 2019-08-10 DIAGNOSIS — Z982 Presence of cerebrospinal fluid drainage device: Secondary | ICD-10-CM

## 2019-08-10 DIAGNOSIS — D62 Acute posthemorrhagic anemia: Secondary | ICD-10-CM | POA: Diagnosis present

## 2019-08-10 DIAGNOSIS — K920 Hematemesis: Secondary | ICD-10-CM | POA: Diagnosis not present

## 2019-08-10 DIAGNOSIS — W1830XA Fall on same level, unspecified, initial encounter: Secondary | ICD-10-CM | POA: Diagnosis not present

## 2019-08-10 DIAGNOSIS — F1990 Other psychoactive substance use, unspecified, uncomplicated: Secondary | ICD-10-CM | POA: Diagnosis present

## 2019-08-10 DIAGNOSIS — R7401 Elevation of levels of liver transaminase levels: Secondary | ICD-10-CM | POA: Diagnosis present

## 2019-08-10 DIAGNOSIS — I2690 Septic pulmonary embolism without acute cor pulmonale: Secondary | ICD-10-CM | POA: Diagnosis present

## 2019-08-10 DIAGNOSIS — F1520 Other stimulant dependence, uncomplicated: Secondary | ICD-10-CM | POA: Diagnosis present

## 2019-08-10 DIAGNOSIS — B9561 Methicillin susceptible Staphylococcus aureus infection as the cause of diseases classified elsewhere: Secondary | ICD-10-CM | POA: Diagnosis present

## 2019-08-10 DIAGNOSIS — R6510 Systemic inflammatory response syndrome (SIRS) of non-infectious origin without acute organ dysfunction: Secondary | ICD-10-CM | POA: Diagnosis present

## 2019-08-10 DIAGNOSIS — R6520 Severe sepsis without septic shock: Secondary | ICD-10-CM | POA: Diagnosis present

## 2019-08-10 DIAGNOSIS — F329 Major depressive disorder, single episode, unspecified: Secondary | ICD-10-CM | POA: Diagnosis present

## 2019-08-10 DIAGNOSIS — Z6841 Body Mass Index (BMI) 40.0 and over, adult: Secondary | ICD-10-CM

## 2019-08-10 DIAGNOSIS — F1910 Other psychoactive substance abuse, uncomplicated: Secondary | ICD-10-CM | POA: Diagnosis present

## 2019-08-10 DIAGNOSIS — R0902 Hypoxemia: Secondary | ICD-10-CM | POA: Diagnosis not present

## 2019-08-10 DIAGNOSIS — J90 Pleural effusion, not elsewhere classified: Secondary | ICD-10-CM | POA: Diagnosis not present

## 2019-08-10 DIAGNOSIS — E669 Obesity, unspecified: Secondary | ICD-10-CM | POA: Diagnosis present

## 2019-08-10 DIAGNOSIS — K5909 Other constipation: Secondary | ICD-10-CM | POA: Diagnosis present

## 2019-08-10 DIAGNOSIS — D649 Anemia, unspecified: Secondary | ICD-10-CM | POA: Diagnosis present

## 2019-08-10 DIAGNOSIS — R918 Other nonspecific abnormal finding of lung field: Secondary | ICD-10-CM

## 2019-08-10 DIAGNOSIS — Z20822 Contact with and (suspected) exposure to covid-19: Secondary | ICD-10-CM | POA: Diagnosis present

## 2019-08-10 DIAGNOSIS — A419 Sepsis, unspecified organism: Secondary | ICD-10-CM

## 2019-08-10 DIAGNOSIS — F250 Schizoaffective disorder, bipolar type: Secondary | ICD-10-CM | POA: Diagnosis present

## 2019-08-10 DIAGNOSIS — Z720 Tobacco use: Secondary | ICD-10-CM | POA: Diagnosis present

## 2019-08-10 DIAGNOSIS — Y92230 Patient room in hospital as the place of occurrence of the external cause: Secondary | ICD-10-CM | POA: Diagnosis not present

## 2019-08-10 DIAGNOSIS — D6959 Other secondary thrombocytopenia: Secondary | ICD-10-CM | POA: Diagnosis present

## 2019-08-10 DIAGNOSIS — R1110 Vomiting, unspecified: Secondary | ICD-10-CM

## 2019-08-10 DIAGNOSIS — A4101 Sepsis due to Methicillin susceptible Staphylococcus aureus: Principal | ICD-10-CM | POA: Diagnosis present

## 2019-08-10 DIAGNOSIS — I330 Acute and subacute infective endocarditis: Secondary | ICD-10-CM | POA: Diagnosis present

## 2019-08-10 DIAGNOSIS — E876 Hypokalemia: Secondary | ICD-10-CM | POA: Diagnosis not present

## 2019-08-10 LAB — POC SARS CORONAVIRUS 2 AG -  ED: SARS Coronavirus 2 Ag: NEGATIVE

## 2019-08-10 MED ORDER — ACETAMINOPHEN 650 MG RE SUPP
975.0000 mg | Freq: Once | RECTAL | Status: AC
  Administered 2019-08-11: 975 mg via RECTAL
  Filled 2019-08-10: qty 1

## 2019-08-10 MED ORDER — ACETAMINOPHEN 325 MG PO TABS
650.0000 mg | ORAL_TABLET | Freq: Once | ORAL | Status: AC | PRN
  Administered 2019-08-10: 650 mg via ORAL
  Filled 2019-08-10: qty 2

## 2019-08-10 MED ORDER — ACETAMINOPHEN 500 MG PO TABS
1000.0000 mg | ORAL_TABLET | Freq: Once | ORAL | Status: DC
  Filled 2019-08-10: qty 2

## 2019-08-10 MED ORDER — METOCLOPRAMIDE HCL 5 MG/ML IJ SOLN
10.0000 mg | Freq: Once | INTRAMUSCULAR | Status: AC
  Administered 2019-08-11: 10 mg via INTRAMUSCULAR
  Filled 2019-08-10: qty 2

## 2019-08-10 MED ORDER — ONDANSETRON HCL 4 MG/2ML IJ SOLN
4.0000 mg | Freq: Once | INTRAMUSCULAR | Status: AC
  Administered 2019-08-11: 4 mg via INTRAVENOUS
  Filled 2019-08-10: qty 2

## 2019-08-10 NOTE — ED Notes (Signed)
Pt asleep in wheelchair. Called x2 for vital recheck before this writer woke pt up.

## 2019-08-10 NOTE — ED Triage Notes (Addendum)
Per EMS, patient from home, c/o chills, decreased appetite and body aches x1 week. Patient also endorses N/V/D. Ambulatory. Febrile in triage. Hx bipolar and schizophrenia.

## 2019-08-10 NOTE — ED Notes (Signed)
Failed attempt to collect labs   

## 2019-08-10 NOTE — ED Provider Notes (Signed)
[facility] DEPT Provider Note   CSN: [id number] Arrival date & time: 08/10/19  1605     History Chief Complaint  Patient presents with  . Generalized Body Aches  . Chills  . Emesis    Justin Chaney is a 24 y.o. male with a history of schizoaffective disorder, IVDU, heroin, methamphetamine, and cocaine use who presents to the emergency department with a chief complaint of fever, chills, body aches, cough, shortness of breath, nausea, vomiting, diarrhea, decreased appetite, and loss of sense of taste and smell for the last week.  He is unsure of having episodes of vomiting and diarrhea he has had today.  He reports that he was prompted to come to the emergency department today because his mother told him to.  He has had no treatment prior to arrival.  Also reports that he has been having some dysuria and frequent urination.  He is unable to state when the symptoms began.  He denies hematemesis, melena, or hematochezia.  States "I'm not answering any more questions from you. I've already told them what's wrong with me."  Per chart review, he has a history of IV drug use.  He will not state when the last time he used.  He denies any known or suspected COVID-19 contacts.  He does report that his mother was ill last week "with a kidney problem."   He has some wounds on his arm that he initially thought was eczema.  He is unsure how long they have been present.  History is limited secondary to patient cooperation.  The history is provided by the patient. No language interpreter was used.       Past Medical History:  Diagnosis Date  . Asthma   . Bipolar disorder ([place])   . 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 Depression   . Homelessness   . Schizophrenia ([place])   . Seizures ([place])    childhood seizures  . Suicidal behavior   . Suicidal intent     Patient Active Problem List   Diagnosis Date Noted  . SIRS (systemic  inflammatory response syndrome) ([place]) 08/11/2019  . Pancytopenia ([place]) 08/11/2019  . Schizoaffective disorder ([place]) 01/10/2019  . Cocaine use disorder, severe, in early remission ([place]) 12/21/2016  . 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 ADHD (attention deficit hyperactivity disorder) 12/21/2016  . 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 Tobacco use disorder 12/21/2016  . Tobacco use 10/06/2016  . Homicidal ideations 09/16/2016  . Suicidal ideation   . Hallucinations   . Homicidal ideation   . Suicidal thoughts   . Cannabis use disorder, moderate, in sustained remission ([place]) 11/07/2015  . Depression 10/29/2015  . Intermittent explosive disorder 09/05/2015  . Schizoaffective disorder, bipolar type ([place]) 09/04/2015  . Malingering 06/05/2015  . Antisocial personality disorder ([place]) 06/03/2015  . Cocaine abuse ([place]) 06/03/2015    Past Surgical History:  Procedure Laterality Date  . APPENDECTOMY    . SHUNT REMOVAL    . TONSILLECTOMY    . VENTRICULO-PERITONEAL SHUNT PLACEMENT / LAPAROSCOPIC INSERTION PERITONEAL CATHETER         Family History  Problem Relation Age of Onset  . Hypertension Mother   . Mental illness Neg Hx     Social History   Tobacco Use  . Smoking status: Current Every Day Smoker    Packs/day: 0.00    Types: Cigars  . Smokeless tobacco: Never Used  Substance Use Topics  . Alcohol use: No  .  Drug use: No    Types: Other-see comments, Amphetamines    Comment: History of use    Home Medications Prior to Admission medications   Medication Sig Start Date End Date Taking? Authorizing Provider  ARIPiprazole ER (ABILIFY MAINTENA) 400 MG SRER injection Inject 2 mLs (400 mg total) into the muscle every 28 (twenty-eight) days. DUE APPROX 7/16 Patient taking differently: Inject 400 mg into the muscle every 28 (twenty-eight) days.  01/16/19   [name], MD  ARIPiprazole Lauroxil ER (ARISTADA) 662 MG/2.4ML PRSY Inject 662 mg into the muscle every 28  (twenty-eight) days.    [provider]  benztropine (COGENTIN) 1 MG tablet Take 1 tablet (1 mg total) by mouth 2 (two) times daily. 01/16/19   [name], MD  haloperidol (HALDOL) 20 MG tablet Take 1 tablet (20 mg total) by mouth at bedtime. 01/16/19   [name], MD  lithium carbonate (LITHOBID) 300 MG CR tablet Take 1 tablet (300 mg total) by mouth every 12 (twelve) hours. 01/16/19   [name], MD  traZODone (DESYREL) 50 MG tablet Take 1 tablet (50 mg total) by mouth at bedtime as needed for sleep. 01/16/19   [name], MD    Allergies    Amoxicillin  Review of Systems   Review of Systems  Constitutional: Positive for chills and fever.  HENT:       Loss of sense of taste and smell  Eyes: Negative for visual disturbance.  Respiratory: Positive for cough and shortness of breath.   Cardiovascular: Positive for chest pain.  Gastrointestinal: Positive for abdominal pain, diarrhea, nausea and vomiting. Negative for anal bleeding and blood in stool.  Genitourinary: Positive for dysuria. Negative for frequency, hematuria and urgency.  Musculoskeletal: Positive for myalgias. Negative for neck pain and neck stiffness.  Neurological: Negative for dizziness, syncope, weakness, numbness and headaches.    Physical Exam Updated Vital Signs BP 95/82   Pulse (!) 125   Temp (!) 103.3 F (39.6 C) (Oral)   Resp 19   SpO2 93%   Physical Exam Vitals and nursing note reviewed.  Constitutional:      General: He is not in acute distress.    Appearance: He is well-developed. He is ill-appearing. He is not toxic-appearing or diaphoretic.  HENT:     Head: Normocephalic.  Eyes:     Conjunctiva/sclera: Conjunctivae normal.  Cardiovascular:     Rate and Rhythm: Normal rate and regular rhythm.     Pulses: Normal pulses.     Heart sounds: Normal heart sounds. No murmur. No friction rub. No gallop.      Comments: Heart is regular rate and rhythm without murmurs rubs or gallops.  Pulmonary:     Effort: Pulmonary effort is normal.  Abdominal:     General: There is no distension.     Palpations: Abdomen is soft.     Comments: Abdomen is mildly distended, but soft.  Diffusely tender to palpation without focal tenderness.  No rebound or guarding.  Musculoskeletal:     Cervical back: Neck supple.     Comments: Ulcerated wounds are noted to the bilateral arms.  Skin:    General: Skin is warm and dry.  Neurological:     Mental Status: He is alert.  Psychiatric:        Behavior: Behavior normal.     ED Results / Procedures / Treatments   Labs (all labs ordered are listed, but only abnormal results are displayed) Labs Reviewed  COMPREHENSIVE METABOLIC PANEL -  Abnormal; Notable for the following components:      Result Value   Sodium 133 (*)    Potassium 2.8 (*)    Chloride 96 (*)    Glucose, Bld 110 (*)    Calcium 8.5 (*)    Albumin 3.4 (*)    AST 56 (*)    ALT 52 (*)    Total Bilirubin 7.6 (*)    All other components within normal limits  LACTIC ACID, PLASMA - Abnormal; Notable for the following components:   Lactic Acid, Venous 3.5 (*)    All other components within normal limits  LACTIC ACID, PLASMA - Abnormal; Notable for the following components:   Lactic Acid, Venous 2.5 (*)    All other components within normal limits  CBC WITH DIFFERENTIAL/PLATELET - Abnormal; Notable for the following components:   Hemoglobin 11.7 (*)    HCT 35.9 (*)    Platelets 42 (*)    Lymphs Abs 0.4 (*)    All other components within normal limits  CULTURE, BLOOD (ROUTINE X 2)  CULTURE, BLOOD (ROUTINE X 2)  SARS CORONAVIRUS 2 (TAT 6-24 HRS)  LIPASE, BLOOD  URINALYSIS, ROUTINE W REFLEX MICROSCOPIC  RAPID URINE DRUG SCREEN, HOSP PERFORMED  HIV ANTIBODY (ROUTINE TESTING W REFLEX)  LACTIC ACID, PLASMA  LACTIC ACID, PLASMA  LACTATE DEHYDROGENASE  DIC (DISSEMINATED INTRAVASCULAR COAGULATION) PANEL  PATHOLOGIST SMEAR REVIEW  POC SARS CORONAVIRUS 2 AG -  ED    EKG  None  Radiology DG Chest Portable 1 View  Result Date: 08/10/2019 CLINICAL DATA:  Fever and cough EXAM: PORTABLE CHEST 1 VIEW COMPARISON:  11/17/2015 FINDINGS: Patient rotation exaggerate the cardiomediastinal silhouette. No consolidation or effusion. No pneumothorax. IMPRESSION: No active disease allowing for patient rotation Electronically Signed   By: Donavan Foil M.D.   On: 08/10/2019 23:43   US Abdomen Limited RUQ  Result Date: 08/11/2019 CLINICAL DATA:  Elevated transaminase level EXAM: ULTRASOUND ABDOMEN LIMITED RIGHT UPPER QUADRANT COMPARISON:  None. FINDINGS: Gallbladder: No gallstones or wall thickening visualized. No sonographic Murphy sign noted by sonographer. Common bile duct: Diameter: 3 mm Liver: No focal lesion identified. Within normal limits in parenchymal echogenicity. Portal vein is patent on color Doppler imaging with normal direction of blood flow towards the liver. Other: None. IMPRESSION: Normal examination Electronically Signed   By: Prudencio Pair M.D.   On: 08/11/2019 02:30    Procedures .Critical Care Performed by: Joanne Gavel, PA-C Authorized by: Joanne Gavel, PA-C   Critical care provider statement:    Critical care time (minutes):  40   Critical care time was exclusive of:  Separately billable procedures and treating other patients and teaching time   Critical care was necessary to treat or prevent imminent or life-threatening deterioration of the following conditions:  Sepsis   Critical care was time spent personally by me on the following activities:  Ordering and performing treatments and interventions, ordering and review of laboratory studies, ordering and review of radiographic studies, pulse oximetry, re-evaluation of patient's condition, review of old charts, obtaining history from patient or surrogate, gastric intubation, examination of patient, evaluation of patient's response to treatment and development of treatment plan with patient or surrogate    I assumed direction of critical care for this patient from another provider in my specialty: no     (including critical care time)  Medications Ordered in ED Medications  potassium chloride 10 mEq in 100 mL IVPB (10 mEq Intravenous New Bag/Given 08/11/19 0154)  metroNIDAZOLE (FLAGYL)  IVPB 500 mg (has no administration in time range)  vancomycin (VANCOCIN) IVPB 1000 mg/200 mL premix (has no administration in time range)  sodium chloride 0.9 % bolus 1,000 mL (has no administration in time range)  iohexol (OMNIPAQUE) 350 MG/ML injection 100 mL (has no administration in time range)  acetaminophen (TYLENOL) tablet 650 mg (has no administration in time range)    Or  acetaminophen (TYLENOL) suppository 650 mg (has no administration in time range)  ondansetron (ZOFRAN) tablet 4 mg (has no administration in time range)    Or  ondansetron (ZOFRAN) injection 4 mg (has no administration in time range)  sodium chloride (PF) 0.9 % injection (has no administration in time range)  acetaminophen (TYLENOL) tablet 650 mg (650 mg Oral Given by Other 08/10/19 1620)  ondansetron (ZOFRAN) injection 4 mg (4 mg Intravenous Given 08/11/19 0039)  metoCLOPramide (REGLAN) injection 10 mg (10 mg Intramuscular Given 08/11/19 0039)  acetaminophen (TYLENOL) suppository 975 mg (975 mg Rectal Given 08/11/19 0043)  sodium chloride 0.9 % bolus 1,000 mL (1,000 mLs Intravenous New Bag/Given 08/11/19 0223)  ceFEPIme (MAXIPIME) 2 g in sodium chloride 0.9 % 100 mL IVPB (2 g Intravenous New Bag/Given 08/11/19 0227)    ED Course  I have reviewed the triage vital signs and the nursing notes.  Pertinent labs & imaging results that were available during my care of the patient were reviewed by me and considered in my medical decision making (see chart for details).    MDM Rules/Calculators/A&P                      24 year old male with a history of schizoaffective disorder, IVDU, heroin, methamphetamine, and cocaine use presenting with  fever, chills, body aches, nausea, vomiting, diarrhea cough, shortness of breath for the last week.   Febrile to 103.3 on arrival.  He is tachycardic in the 130s.  He is normotensive and has no hypoxia.  He is tender to palpation diffusely throughout the abdomen on exam.  Lungs are clear to auscultation bilaterally and chest x-ray is unremarkable.  Initial concern for presentation is for COVID-19 given that the patient's symptoms began approximately 1 week ago. However, rapid Covid test is negative.  Will order PCR test.    The patient was initially given a dose of oral Tylenol, but vomited shortly thereafter.  He tolerated Tylenol suppository.  Labs are notable for elevated lactate of 3.5.  IV fluids have been ordered.  Transaminases are mildly elevated at 56 and 52 for AST and ALT respectively.  Total bilirubin is 7.6.  Will order right upper quadrant ultrasound.  He is also hypokalemic at 2.8.  Will order IV potassium chloride as he is still unable to tolerate p.o. fluids at this time. Hemoglobin is noted to be 11.7, down from 14.8 on December 20.  He denies any hematemesis, melena, hematochezia.  Will continue to follow.  He is also noted to be thrombocytopenic with platelets of 42, down from 296 on 12/20.   Since the patient is meets sepsis criteria, he was empirically started on cefepime, vancomycin, and metronidazole given concern for intra-abdominal etiology.  RUQ [name] is unremarkable.  On re-evaluation, he reports that he is feeling somewhat improved.  He is endorsing a headache and back pain.  He also elaborates on history of present illness.  Reports that he has been so short of breath over the last few days and generally weak that he could barely walk.  He reports that he has  not used IV drugs in over a month.   He is alert and oriented x3.  Low suspicion for meningitis.  Given shortness of breath with persistent tachycardia, will order PE study, which is pending, since repeat Covid test is  pending.  The patient is requested I call his mother.  Attempted phone call unsuccessfully.  Could also consider endocarditis in the setting of IV drug use.  No murmurs auscultated on cardiac exam.   The patient was discussed with Dr. Randal Buba, attending physician. Consult to the hospitalist team and Dr. Hal Hope will accept for admission. The patient appears reasonably stabilized for admission considering the current resources, flow, and capabilities available in the ED at this time, and I doubt any other [facility] requiring further screening and/or treatment in the ED prior to admission.  Final Clinical Impression(s) / ED Diagnoses Final diagnoses:  Elevated transaminase level  Sepsis with acute organ dysfunction without septic shock, due to unspecified organism, unspecified type [facility])    Rx / DC Orders ED Discharge Orders    None       Joanne Gavel, PA-C 08/11/19 [place], April, MD 08/11/19 [phone]

## 2019-08-11 ENCOUNTER — Inpatient Hospital Stay (HOSPITAL_COMMUNITY): Payer: Medicaid Other

## 2019-08-11 ENCOUNTER — Encounter (HOSPITAL_COMMUNITY): Payer: Self-pay | Admitting: Internal Medicine

## 2019-08-11 ENCOUNTER — Emergency Department (HOSPITAL_COMMUNITY): Payer: Medicaid Other

## 2019-08-11 DIAGNOSIS — F1120 Opioid dependence, uncomplicated: Secondary | ICD-10-CM | POA: Diagnosis not present

## 2019-08-11 DIAGNOSIS — R7881 Bacteremia: Secondary | ICD-10-CM | POA: Diagnosis not present

## 2019-08-11 DIAGNOSIS — F1910 Other psychoactive substance abuse, uncomplicated: Secondary | ICD-10-CM | POA: Diagnosis not present

## 2019-08-11 DIAGNOSIS — D6959 Other secondary thrombocytopenia: Secondary | ICD-10-CM | POA: Diagnosis not present

## 2019-08-11 DIAGNOSIS — I2602 Saddle embolus of pulmonary artery with acute cor pulmonale: Secondary | ICD-10-CM | POA: Diagnosis not present

## 2019-08-11 DIAGNOSIS — D62 Acute posthemorrhagic anemia: Secondary | ICD-10-CM | POA: Diagnosis not present

## 2019-08-11 DIAGNOSIS — G4700 Insomnia, unspecified: Secondary | ICD-10-CM | POA: Diagnosis present

## 2019-08-11 DIAGNOSIS — R509 Fever, unspecified: Secondary | ICD-10-CM | POA: Diagnosis present

## 2019-08-11 DIAGNOSIS — F329 Major depressive disorder, single episode, unspecified: Secondary | ICD-10-CM | POA: Diagnosis present

## 2019-08-11 DIAGNOSIS — Z982 Presence of cerebrospinal fluid drainage device: Secondary | ICD-10-CM | POA: Diagnosis not present

## 2019-08-11 DIAGNOSIS — E876 Hypokalemia: Secondary | ICD-10-CM | POA: Diagnosis not present

## 2019-08-11 DIAGNOSIS — R6510 Systemic inflammatory response syndrome (SIRS) of non-infectious origin without acute organ dysfunction: Secondary | ICD-10-CM | POA: Diagnosis not present

## 2019-08-11 DIAGNOSIS — F250 Schizoaffective disorder, bipolar type: Secondary | ICD-10-CM | POA: Diagnosis present

## 2019-08-11 DIAGNOSIS — F319 Bipolar disorder, unspecified: Secondary | ICD-10-CM | POA: Diagnosis not present

## 2019-08-11 DIAGNOSIS — L98499 Non-pressure chronic ulcer of skin of other sites with unspecified severity: Secondary | ICD-10-CM | POA: Diagnosis not present

## 2019-08-11 DIAGNOSIS — I361 Nonrheumatic tricuspid (valve) insufficiency: Secondary | ICD-10-CM | POA: Diagnosis not present

## 2019-08-11 DIAGNOSIS — K920 Hematemesis: Secondary | ICD-10-CM | POA: Diagnosis not present

## 2019-08-11 DIAGNOSIS — J45909 Unspecified asthma, uncomplicated: Secondary | ICD-10-CM | POA: Diagnosis present

## 2019-08-11 DIAGNOSIS — J90 Pleural effusion, not elsewhere classified: Secondary | ICD-10-CM | POA: Diagnosis not present

## 2019-08-11 DIAGNOSIS — Z6841 Body Mass Index (BMI) 40.0 and over, adult: Secondary | ICD-10-CM | POA: Diagnosis not present

## 2019-08-11 DIAGNOSIS — R7401 Elevation of levels of liver transaminase levels: Secondary | ICD-10-CM | POA: Diagnosis not present

## 2019-08-11 DIAGNOSIS — Z79899 Other long term (current) drug therapy: Secondary | ICD-10-CM | POA: Diagnosis not present

## 2019-08-11 DIAGNOSIS — A4101 Sepsis due to Methicillin susceptible Staphylococcus aureus: Secondary | ICD-10-CM | POA: Diagnosis not present

## 2019-08-11 DIAGNOSIS — J15211 Pneumonia due to Methicillin susceptible Staphylococcus aureus: Secondary | ICD-10-CM | POA: Diagnosis not present

## 2019-08-11 DIAGNOSIS — Z20822 Contact with and (suspected) exposure to covid-19: Secondary | ICD-10-CM | POA: Diagnosis not present

## 2019-08-11 DIAGNOSIS — I2690 Septic pulmonary embolism without acute cor pulmonale: Secondary | ICD-10-CM | POA: Diagnosis not present

## 2019-08-11 DIAGNOSIS — R911 Solitary pulmonary nodule: Secondary | ICD-10-CM | POA: Diagnosis not present

## 2019-08-11 DIAGNOSIS — K5909 Other constipation: Secondary | ICD-10-CM | POA: Diagnosis present

## 2019-08-11 DIAGNOSIS — F1990 Other psychoactive substance use, unspecified, uncomplicated: Secondary | ICD-10-CM | POA: Diagnosis not present

## 2019-08-11 DIAGNOSIS — E872 Acidosis: Secondary | ICD-10-CM | POA: Diagnosis not present

## 2019-08-11 DIAGNOSIS — F17290 Nicotine dependence, other tobacco product, uncomplicated: Secondary | ICD-10-CM | POA: Diagnosis present

## 2019-08-11 DIAGNOSIS — W1830XA Fall on same level, unspecified, initial encounter: Secondary | ICD-10-CM | POA: Diagnosis not present

## 2019-08-11 DIAGNOSIS — D649 Anemia, unspecified: Secondary | ICD-10-CM | POA: Diagnosis present

## 2019-08-11 DIAGNOSIS — Z9119 Patient's noncompliance with other medical treatment and regimen: Secondary | ICD-10-CM | POA: Diagnosis not present

## 2019-08-11 DIAGNOSIS — I330 Acute and subacute infective endocarditis: Secondary | ICD-10-CM | POA: Diagnosis not present

## 2019-08-11 DIAGNOSIS — D61818 Other pancytopenia: Secondary | ICD-10-CM

## 2019-08-11 DIAGNOSIS — A419 Sepsis, unspecified organism: Secondary | ICD-10-CM | POA: Diagnosis not present

## 2019-08-11 DIAGNOSIS — B9561 Methicillin susceptible Staphylococcus aureus infection as the cause of diseases classified elsewhere: Secondary | ICD-10-CM | POA: Diagnosis not present

## 2019-08-11 DIAGNOSIS — R6520 Severe sepsis without septic shock: Secondary | ICD-10-CM | POA: Diagnosis present

## 2019-08-11 DIAGNOSIS — E669 Obesity, unspecified: Secondary | ICD-10-CM | POA: Diagnosis present

## 2019-08-11 DIAGNOSIS — Y92230 Patient room in hospital as the place of occurrence of the external cause: Secondary | ICD-10-CM | POA: Diagnosis not present

## 2019-08-11 DIAGNOSIS — I38 Endocarditis, valve unspecified: Secondary | ICD-10-CM | POA: Diagnosis not present

## 2019-08-11 DIAGNOSIS — F1520 Other stimulant dependence, uncomplicated: Secondary | ICD-10-CM | POA: Diagnosis not present

## 2019-08-11 DIAGNOSIS — I071 Rheumatic tricuspid insufficiency: Secondary | ICD-10-CM | POA: Diagnosis not present

## 2019-08-11 LAB — COMPREHENSIVE METABOLIC PANEL
ALT: 52 U/L — ABNORMAL HIGH (ref 0–44)
AST: 56 U/L — ABNORMAL HIGH (ref 15–41)
Albumin: 3.4 g/dL — ABNORMAL LOW (ref 3.5–5.0)
Alkaline Phosphatase: 111 U/L (ref 38–126)
Anion gap: 12 (ref 5–15)
BUN: 11 mg/dL (ref 6–20)
CO2: 25 mmol/L (ref 22–32)
Calcium: 8.5 mg/dL — ABNORMAL LOW (ref 8.9–10.3)
Chloride: 96 mmol/L — ABNORMAL LOW (ref 98–111)
Creatinine, Ser: 0.78 mg/dL (ref 0.61–1.24)
GFR calc Af Amer: >60 mL/min (ref >60)
GFR calc non Af Amer: >60 mL/min (ref >60)
Glucose, Bld: 110 mg/dL — ABNORMAL HIGH (ref 70–99)
Potassium: 2.8 mmol/L — ABNORMAL LOW (ref 3.5–5.1)
Sodium: 133 mmol/L — ABNORMAL LOW (ref 135–145)
Total Bilirubin: 7.6 mg/dL — ABNORMAL HIGH (ref 0.3–1.2)
Total Protein: 7.3 g/dL (ref 6.5–8.1)

## 2019-08-11 LAB — URINALYSIS, ROUTINE W REFLEX MICROSCOPIC
Bilirubin Urine: NEGATIVE
Glucose, UA: NEGATIVE mg/dL
Ketones, ur: NEGATIVE mg/dL
Leukocytes,Ua: NEGATIVE
Nitrite: NEGATIVE
Protein, ur: NEGATIVE mg/dL
Specific Gravity, Urine: 1.018 (ref 1.005–1.030)
pH: 7 (ref 5.0–8.0)

## 2019-08-11 LAB — HEPATIC FUNCTION PANEL
ALT: 54 U/L — ABNORMAL HIGH (ref 0–44)
AST: 57 U/L — ABNORMAL HIGH (ref 15–41)
Albumin: 3.4 g/dL — ABNORMAL LOW (ref 3.5–5.0)
Alkaline Phosphatase: 110 U/L (ref 38–126)
Bilirubin, Direct: 4.3 mg/dL — ABNORMAL HIGH (ref 0.0–0.2)
Indirect Bilirubin: 3.2 mg/dL — ABNORMAL HIGH (ref 0.3–0.9)
Total Bilirubin: 7.5 mg/dL — ABNORMAL HIGH (ref 0.3–1.2)
Total Protein: 7.7 g/dL (ref 6.5–8.1)

## 2019-08-11 LAB — CBC WITH DIFFERENTIAL/PLATELET
Abs Immature Granulocytes: 0.04 10*3/uL (ref 0.00–0.07)
Abs Immature Granulocytes: 0.06 10*3/uL (ref 0.00–0.07)
Basophils Absolute: 0 10*3/uL (ref 0.0–0.1)
Basophils Absolute: 0.1 10*3/uL (ref 0.0–0.1)
Basophils Relative: 1 %
Basophils Relative: 1 %
Eosinophils Absolute: 0 10*3/uL (ref 0.0–0.5)
Eosinophils Absolute: 0 10*3/uL (ref 0.0–0.5)
Eosinophils Relative: 0 %
Eosinophils Relative: 0 %
HCT: 35.9 % — ABNORMAL LOW (ref 39.0–52.0)
HCT: 45.3 % (ref 39.0–52.0)
Hemoglobin: 11.7 g/dL — ABNORMAL LOW (ref 13.0–17.0)
Hemoglobin: 14.5 g/dL (ref 13.0–17.0)
Immature Granulocytes: 1 %
Immature Granulocytes: 1 %
Lymphocytes Relative: 10 %
Lymphocytes Relative: 6 %
Lymphs Abs: 0.4 10*3/uL — ABNORMAL LOW (ref 0.7–4.0)
Lymphs Abs: 0.6 10*3/uL — ABNORMAL LOW (ref 0.7–4.0)
MCH: 27.5 pg (ref 26.0–34.0)
MCH: 27.2 pg (ref 26.0–34.0)
MCHC: 32.6 g/dL (ref 30.0–36.0)
MCHC: 32 g/dL (ref 30.0–36.0)
MCV: 84.3 fL (ref 80.0–100.0)
MCV: 85 fL (ref 80.0–100.0)
Monocytes Absolute: 0.2 10*3/uL (ref 0.1–1.0)
Monocytes Absolute: 0.6 10*3/uL (ref 0.1–1.0)
Monocytes Relative: 5 %
Monocytes Relative: 7 %
Neutro Abs: 3.6 10*3/uL (ref 1.7–7.7)
Neutro Abs: 7.7 10*3/uL (ref 1.7–7.7)
Neutrophils Relative %: 83 %
Neutrophils Relative %: 85 %
Platelets: 42 10*3/uL — ABNORMAL LOW (ref 150–400)
Platelets: 121 10*3/uL — ABNORMAL LOW (ref 150–400)
RBC: 4.26 MIL/uL (ref 4.22–5.81)
RBC: 5.33 MIL/uL (ref 4.22–5.81)
RDW: 12.9 % (ref 11.5–15.5)
RDW: 13 % (ref 11.5–15.5)
WBC: 4.3 10*3/uL (ref 4.0–10.5)
WBC: 9 10*3/uL (ref 4.0–10.5)
nRBC: 0 % (ref 0.0–0.2)
nRBC: 0 % (ref 0.0–0.2)

## 2019-08-11 LAB — MAGNESIUM: Magnesium: 2 mg/dL (ref 1.7–2.4)

## 2019-08-11 LAB — ACETAMINOPHEN LEVEL: Acetaminophen (Tylenol), Serum: <10 ug/mL — ABNORMAL LOW (ref 10–30)

## 2019-08-11 LAB — LACTIC ACID, PLASMA
Lactic Acid, Venous: 3.5 mmol/L (ref 0.5–1.9)
Lactic Acid, Venous: 2.5 mmol/L (ref 0.5–1.9)
Lactic Acid, Venous: 2.4 mmol/L (ref 0.5–1.9)
Lactic Acid, Venous: 2.4 mmol/L (ref 0.5–1.9)

## 2019-08-11 LAB — RAPID URINE DRUG SCREEN, HOSP PERFORMED
Amphetamines: POSITIVE — AB
Barbiturates: NOT DETECTED
Benzodiazepines: NOT DETECTED
Cocaine: NOT DETECTED
Opiates: NOT DETECTED
Tetrahydrocannabinol: NOT DETECTED

## 2019-08-11 LAB — HEPATITIS PANEL, ACUTE
HCV Ab: NONREACTIVE
Hep A IgM: NONREACTIVE
Hep B C IgM: NONREACTIVE
Hepatitis B Surface Ag: NONREACTIVE

## 2019-08-11 LAB — DIC (DISSEMINATED INTRAVASCULAR COAGULATION)PANEL
D-Dimer, Quant: 2.49 ug/mL-FEU — ABNORMAL HIGH (ref 0.00–0.50)
Fibrinogen: 510 mg/dL — ABNORMAL HIGH (ref 210–475)
INR: 1.2 (ref 0.8–1.2)
Platelets: 97 10*3/uL — ABNORMAL LOW (ref 150–400)
Prothrombin Time: 15.1 seconds (ref 11.4–15.2)
Smear Review: NONE SEEN
aPTT: 24 seconds (ref 24–36)

## 2019-08-11 LAB — BASIC METABOLIC PANEL
Anion gap: 12 (ref 5–15)
BUN: 9 mg/dL (ref 6–20)
CO2: 24 mmol/L (ref 22–32)
Calcium: 8.4 mg/dL — ABNORMAL LOW (ref 8.9–10.3)
Chloride: 98 mmol/L (ref 98–111)
Creatinine, Ser: 0.79 mg/dL (ref 0.61–1.24)
GFR calc Af Amer: >60 mL/min (ref >60)
GFR calc non Af Amer: >60 mL/min (ref >60)
Glucose, Bld: 104 mg/dL — ABNORMAL HIGH (ref 70–99)
Potassium: 3.2 mmol/L — ABNORMAL LOW (ref 3.5–5.1)
Sodium: 134 mmol/L — ABNORMAL LOW (ref 135–145)

## 2019-08-11 LAB — LITHIUM LEVEL: Lithium Lvl: <0.06 mmol/L — ABNORMAL LOW (ref 0.60–1.20)

## 2019-08-11 LAB — CK: Total CK: 827 U/L — ABNORMAL HIGH (ref 49–397)

## 2019-08-11 LAB — SARS CORONAVIRUS 2 (TAT 6-24 HRS): SARS Coronavirus 2: NEGATIVE

## 2019-08-11 LAB — C-REACTIVE PROTEIN: CRP: 28.5 mg/dL — ABNORMAL HIGH (ref <1.0)

## 2019-08-11 LAB — LIPASE, BLOOD: Lipase: 20 U/L (ref 11–51)

## 2019-08-11 LAB — LACTATE DEHYDROGENASE: LDH: 318 U/L — ABNORMAL HIGH (ref 98–192)

## 2019-08-11 LAB — HIV ANTIBODY (ROUTINE TESTING W REFLEX): HIV Screen 4th Generation wRfx: NONREACTIVE

## 2019-08-11 LAB — FERRITIN: Ferritin: 1042 ng/mL — ABNORMAL HIGH (ref 24–336)

## 2019-08-11 MED ORDER — METRONIDAZOLE IN NACL 5-0.79 MG/ML-% IV SOLN
500.0000 mg | Freq: Three times a day (TID) | INTRAVENOUS | Status: DC
  Administered 2019-08-11 – 2019-08-12 (×3): 500 mg via INTRAVENOUS
  Filled 2019-08-11 (×3): qty 100

## 2019-08-11 MED ORDER — ONDANSETRON HCL 4 MG/2ML IJ SOLN
4.0000 mg | Freq: Four times a day (QID) | INTRAMUSCULAR | Status: DC | PRN
  Administered 2019-08-11 – 2019-08-28 (×6): 4 mg via INTRAVENOUS
  Filled 2019-08-11 (×7): qty 2

## 2019-08-11 MED ORDER — POTASSIUM CHLORIDE 10 MEQ/100ML IV SOLN
10.0000 meq | INTRAVENOUS | Status: AC
  Administered 2019-08-11 (×2): 10 meq via INTRAVENOUS
  Filled 2019-08-11 (×2): qty 100

## 2019-08-11 MED ORDER — SODIUM CHLORIDE 0.9 % IV BOLUS
1000.0000 mL | Freq: Once | INTRAVENOUS | Status: AC
  Administered 2019-08-11: 1000 mL via INTRAVENOUS

## 2019-08-11 MED ORDER — VANCOMYCIN HCL 1250 MG/250ML IV SOLN
1250.0000 mg | Freq: Two times a day (BID) | INTRAVENOUS | Status: DC
  Administered 2019-08-11 – 2019-08-13 (×4): 1250 mg via INTRAVENOUS
  Filled 2019-08-11 (×6): qty 250

## 2019-08-11 MED ORDER — SODIUM CHLORIDE (PF) 0.9 % IJ SOLN
INTRAMUSCULAR | Status: AC
  Filled 2019-08-11: qty 50

## 2019-08-11 MED ORDER — ACETAMINOPHEN 325 MG PO TABS
650.0000 mg | ORAL_TABLET | Freq: Four times a day (QID) | ORAL | Status: DC | PRN
  Administered 2019-08-11 – 2019-08-27 (×11): 650 mg via ORAL
  Filled 2019-08-11 (×11): qty 2

## 2019-08-11 MED ORDER — KCL-LACTATED RINGERS 20 MEQ/L IV SOLN
INTRAVENOUS | Status: DC
  Filled 2019-08-11: qty 1000

## 2019-08-11 MED ORDER — KCL-LACTATED RINGERS 20 MEQ/L IV SOLN
INTRAVENOUS | Status: DC
  Filled 2019-08-11 (×2): qty 1000

## 2019-08-11 MED ORDER — SODIUM CHLORIDE 0.9 % IV SOLN
2.0000 g | Freq: Three times a day (TID) | INTRAVENOUS | Status: DC
  Administered 2019-08-11 – 2019-08-12 (×4): 2 g via INTRAVENOUS
  Filled 2019-08-11 (×4): qty 2

## 2019-08-11 MED ORDER — METRONIDAZOLE IN NACL 5-0.79 MG/ML-% IV SOLN
500.0000 mg | Freq: Once | INTRAVENOUS | Status: AC
  Administered 2019-08-11: 500 mg via INTRAVENOUS
  Filled 2019-08-11: qty 100

## 2019-08-11 MED ORDER — IBUPROFEN 200 MG PO TABS
200.0000 mg | ORAL_TABLET | ORAL | Status: DC | PRN
  Administered 2019-08-11 – 2019-08-26 (×5): 200 mg via ORAL
  Filled 2019-08-11 (×5): qty 1

## 2019-08-11 MED ORDER — KETOROLAC TROMETHAMINE 30 MG/ML IJ SOLN: 30.0000 mg | Freq: Once | INTRAMUSCULAR | Status: DC

## 2019-08-11 MED ORDER — KCL-LACTATED RINGERS 20 MEQ/L IV SOLN
INTRAVENOUS | Status: AC
  Filled 2019-08-11: qty 1000

## 2019-08-11 MED ORDER — POTASSIUM CHLORIDE 2 MEQ/ML IV SOLN: INTRAVENOUS | Status: DC

## 2019-08-11 MED ORDER — SODIUM CHLORIDE 0.9 % IV SOLN
2.0000 g | Freq: Once | INTRAVENOUS | Status: AC
  Administered 2019-08-11: 2 g via INTRAVENOUS
  Filled 2019-08-11: qty 2

## 2019-08-11 MED ORDER — IOHEXOL 350 MG/ML SOLN
100.0000 mL | Freq: Once | INTRAVENOUS | Status: AC | PRN
  Administered 2019-08-11: 100 mL via INTRAVENOUS

## 2019-08-11 MED ORDER — VANCOMYCIN HCL IN DEXTROSE 1-5 GM/200ML-% IV SOLN
1000.0000 mg | Freq: Once | INTRAVENOUS | Status: AC
  Administered 2019-08-11: 1000 mg via INTRAVENOUS
  Filled 2019-08-11: qty 200

## 2019-08-11 MED ORDER — ACETAMINOPHEN 650 MG RE SUPP: 650.0000 mg | Freq: Four times a day (QID) | RECTAL | Status: DC | PRN

## 2019-08-11 MED ORDER — CHLORHEXIDINE GLUCONATE CLOTH 2 % EX PADS
6.0000 | MEDICATED_PAD | Freq: Every day | CUTANEOUS | Status: DC
  Administered 2019-08-12 – 2019-08-13 (×2): 6 via TOPICAL

## 2019-08-11 MED ORDER — ONDANSETRON HCL 4 MG PO TABS: 4.0000 mg | ORAL_TABLET | Freq: Four times a day (QID) | ORAL | Status: DC | PRN

## 2019-08-11 MED ORDER — VANCOMYCIN HCL 1750 MG/350ML IV SOLN
1750.0000 mg | Freq: Two times a day (BID) | INTRAVENOUS | Status: DC
  Filled 2019-08-11: qty 350

## 2019-08-11 NOTE — ED Notes (Signed)
Pt continually pulling off monitoring device. Pt refuses to keep monitoring equipment on

## 2019-08-11 NOTE — ED Notes (Signed)
IV team consulted. IV in right upper arm is painful and difficult to flush. Vanc infusion not completed due to questionable patency of IV. Will resume vanc once IV team has established additional IV

## 2019-08-11 NOTE — ED Notes (Signed)
Pt given lunch tray which was 100% consumed while NT was in the room. Additionally, pt consumed 480 cc of oral fluids. Vitals reassessed and bedside cleaning completed. No other concerns/needs expressed by the pt at this time. Pt requested lights to be turned off so he can rest.

## 2019-08-11 NOTE — ED Notes (Signed)
Patient tolerated 6 oz beef broth without vomiting

## 2019-08-11 NOTE — ED Notes (Signed)
[facility] called to check on status of COVID swab. Lab to call back

## 2019-08-11 NOTE — ED Notes (Addendum)
Pt vomiting. Pt vomited in trash can, in bedside commode, on wall, and in floor. Room cleaned. Pt noted to be shivering, Oral temp obtained- 103.31F oral. Tylenol administered.

## 2019-08-11 NOTE — H&P (Signed)
History and Physical    Justin Chaney [id number] DOB: 07/07/1996 DOA: 08/10/2019  PCP: Patient, No Pcp Per  Patient coming from: Home.  Chief Complaint: Fever chills.  HPI: Justin Chaney is a 24 y.o. male with history of bipolar disorder, history of VP shunt, schizophrenia and IV drug abuse presents to the ER because of fever chills with nausea vomiting diarrhea shortness of breath ongoing for last 1 week.  Patient states he has not been taking any of his psychiatric medication for many months.  He has been having generalized body ache denies any Covid positive contacts.  He has been short of breath even at rest with some nonproductive cough headache is mostly frontal.  Denies any weakness of the upper or lower extremities.  ED Course: In the ER patient was febrile with temperature 103 F initially tachycardic to 130s.  Blood pressure was in the low normal improved with fluid bolus.  Labs reveal potassium 2.8 sodium 133 creatinine 0.78 lactic acid 3.5 improved with fluids CBC was remarkable for low platelets of 42 with hemoglobin of 11.7.  CT abdomen pelvis and CT chest was unremarkable.  Covid test is pending.  Patient was empirically started on antibiotics for possible loading sepsis of unknown source.  Since patient's LFTs were mildly elevated at around 56 and 52 sonogram of the abdomen was done which was unremarkable.  Review of Systems: As per HPI, rest all negative.   Past Medical History:  Diagnosis Date  . Asthma   . Bipolar disorder ([place])   . 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 Depression   . Homelessness   . Schizophrenia ([place])   . Seizures ([place])    childhood seizures  . Suicidal behavior   . Suicidal intent     Past Surgical History:  Procedure Laterality Date  . APPENDECTOMY    . SHUNT REMOVAL    . TONSILLECTOMY    . VENTRICULO-PERITONEAL SHUNT PLACEMENT / LAPAROSCOPIC INSERTION PERITONEAL CATHETER       reports that he has been  smoking cigars. He has been smoking about 0.00 packs per day. He has never used smokeless tobacco. He reports that he does not drink alcohol or use drugs.  Allergies  Allergen Reactions  . Amoxicillin Hives and Other (See Comments)    CHILDHOOD ALLERGY - Per [facility] records, tolerates Ancef and Rocephin Has patient had a PCN reaction causing immediate rash, facial/tongue/throat swelling, SOB or lightheadedness with hypotension: YES Has patient had a PCN reaction causing severe rash involving mucus membranes or skin necrosis: No Has patient had a PCN reaction that required hospitalization No    Family History  Problem Relation Age of Onset  . Hypertension Mother   . Mental illness Neg Hx     Prior to Admission medications   Medication Sig Start Date End Date Taking? Authorizing Provider  ARIPiprazole ER (ABILIFY MAINTENA) 400 MG SRER injection Inject 2 mLs (400 mg total) into the muscle every 28 (twenty-eight) days. DUE APPROX 7/16 Patient taking differently: Inject 400 mg into the muscle every 28 (twenty-eight) days.  01/16/19   [name], MD  ARIPiprazole Lauroxil ER (ARISTADA) 662 MG/2.4ML PRSY Inject 662 mg into the muscle every 28 (twenty-eight) days.    [provider]  benztropine (COGENTIN) 1 MG tablet Take 1 tablet (1 mg total) by mouth 2 (two) times daily. 01/16/19   [name], MD  haloperidol (HALDOL) 20 MG tablet Take 1 tablet (20 mg total) by mouth at  bedtime. 01/16/19   [name], MD  lithium carbonate (LITHOBID) 300 MG CR tablet Take 1 tablet (300 mg total) by mouth every 12 (twelve) hours. 01/16/19   [name], MD  traZODone (DESYREL) 50 MG tablet Take 1 tablet (50 mg total) by mouth at bedtime as needed for sleep. 01/16/19   [name], MD    Physical Exam: Constitutional: Moderately built and nourished. Vitals:   08/11/19 0100 08/11/19 0130 08/11/19 0200 08/11/19 0230  BP: (!) 103/47 (!) 113/58 (!) 119/38 95/82  Pulse: (!) 131 (!) 127 (!) 130 (!) 125    Resp: (!) 24 (!) 24  19  Temp:      TempSrc:      SpO2: 97% 97% 95% 93%   Eyes: Anicteric no pallor. ENMT: No discharge from the ears eyes nose or mouth. Neck: No mass or.  No neck rigidity. Respiratory: No rhonchi or crepitations. Cardiovascular: S1-S2 heard. Abdomen: Soft nontender bowel sounds present. Musculoskeletal: No edema. Skin: No rash. Neurologic: Alert awake oriented to time place and person.  Moves all extremities 5 x 5. Psychiatric: Appears normal.   Labs on Admission: I have personally reviewed following labs and imaging studies  CBC: Recent Labs  Lab 08/11/19 0003  WBC 4.3  NEUTROABS 3.6  HGB 11.7*  HCT 35.9*  MCV 84.3  PLT 42*   Basic Metabolic Panel: Recent Labs  Lab 08/11/19 0003  NA 133*  K 2.8*  CL 96*  CO2 25  GLUCOSE 110*  BUN 11  CREATININE 0.78  CALCIUM 8.5*   GFR: CrCl cannot be calculated (Unknown ideal weight.). Liver Function Tests: Recent Labs  Lab 08/11/19 0003  AST 56*  ALT 52*  ALKPHOS 111  BILITOT 7.6*  PROT 7.3  ALBUMIN 3.4*   Recent Labs  Lab 08/11/19 0003  LIPASE 20   No results for input(s): AMMONIA in the last 168 hours. Coagulation Profile: No results for input(s): INR, PROTIME in the last 168 hours. Cardiac Enzymes: No results for input(s): CKTOTAL, CKMB, CKMBINDEX, TROPONINI in the last 168 hours. BNP (last 3 results) No results for input(s): PROBNP in the last 8760 hours. HbA1C: No results for input(s): HGBA1C in the last 72 hours. CBG: No results for input(s): GLUCAP in the last 168 hours. Lipid Profile: No results for input(s): CHOL, HDL, LDLCALC, TRIG, CHOLHDL, LDLDIRECT in the last 72 hours. Thyroid Function Tests: No results for input(s): TSH, T4TOTAL, FREET4, T3FREE, THYROIDAB in the last 72 hours. Anemia Panel: No results for input(s): VITAMINB12, FOLATE, FERRITIN, TIBC, IRON, RETICCTPCT in the last 72 hours. Urine analysis:    Component Value Date/Time   COLORURINE AMBER (A)  12/11/2017 1147   APPEARANCEUR HAZY (A) 12/11/2017 1147   LABSPEC 1.031 (H) 12/11/2017 1147   PHURINE 5.0 12/11/2017 1147   GLUCOSEU NEGATIVE 12/11/2017 1147   HGBUR NEGATIVE 12/11/2017 1147   BILIRUBINUR NEGATIVE 12/11/2017 1147   KETONESUR 5 (A) 12/11/2017 1147   PROTEINUR 100 (A) 12/11/2017 1147   UROBILINOGEN 2.0 (H) 04/01/2014 2200   NITRITE NEGATIVE 12/11/2017 1147   LEUKOCYTESUR NEGATIVE 12/11/2017 1147   Sepsis Labs: @LABRCNTIP (procalcitonin:4,lacticidven:4) )No results found for this or any previous visit (from the past 240 hour(s)).   Radiological Exams on Admission: DG Chest Portable 1 View  Result Date: 08/10/2019 CLINICAL DATA:  Fever and cough EXAM: PORTABLE CHEST 1 VIEW COMPARISON:  11/17/2015 FINDINGS: Patient rotation exaggerate the cardiomediastinal silhouette. No consolidation or effusion. No pneumothorax. IMPRESSION: No active disease allowing for patient rotation Electronically Signed  By: Donavan Foil M.D.   On: 08/10/2019 23:43   US Abdomen Limited RUQ  Result Date: 08/11/2019 CLINICAL DATA:  Elevated transaminase level EXAM: ULTRASOUND ABDOMEN LIMITED RIGHT UPPER QUADRANT COMPARISON:  None. FINDINGS: Gallbladder: No gallstones or wall thickening visualized. No sonographic Murphy sign noted by sonographer. Common bile duct: Diameter: 3 mm Liver: No focal lesion identified. Within normal limits in parenchymal echogenicity. Portal vein is patent on color Doppler imaging with normal direction of blood flow towards the liver. Other: None. IMPRESSION: Normal examination Electronically Signed   By: Prudencio Pair M.D.   On: 08/11/2019 02:30      Assessment/Plan Principal Problem:   SIRS (systemic inflammatory response syndrome) (HCC) Active Problems:   Schizoaffective disorder, bipolar type (HCC)   Pancytopenia ([place])    1. SIRS source not clear.  Covid test is pending.  Blood cultures have been obtained and patient started on empiric antibiotics.  Since patient has  history of VP shunt I have ordered CT head.  Continue with IV fluids follow lactate.  Given the abnormal CBC may have to rule any hematological causes also. 2. Anemia and thrombocytopenia which appears to be new -we will have to rule out hemolytic process for which I have ordered smear review LDH repeat CBC and DIC panel.  Closely follow CBC with differential.  Will need hematology input if persist. 3. Mildly elevated LFTs likely from sepsis.  Sonogram of the abdomen was unremarkable.  Will follow LFTs check acute hepatitis panel. 4. History of drug abuse patient states he has not had any IV drugs more than 1 month. 5. History of schizophrenia -patient states he has not taken any of his medications for many months.  Check lithium levels.  Covid test is pending.  Given the septic-like picture will need inpatient status.   DVT prophylaxis: SCDs due to thrombocytopenia. Code Status: Full code. Family Communication: Try to reach patient's mother. Disposition Plan: Home.  When stable. Consults called: None. Admission status: Inpatient.   Rise Patience MD [facility] Pager [phone].  If 7PM-7AM, please contact night-coverage [email or web address] Password [email or web address]  08/11/2019, 3:21 AM

## 2019-08-11 NOTE — ED Notes (Signed)
Vancomycin still infusing. Will administer Cefepime and LR when vanc is completed

## 2019-08-11 NOTE — Progress Notes (Signed)
A consult was received from an ED physician for vancomycin and cefepime per pharmacy dosing (for an indication other than meningitis). The patient's profile has been reviewed for ht/wt/allergies/indication/available labs. A one time order has been placed for the above antibiotics.  Further antibiotics/pharmacy consults should be ordered by admitting physician if indicated.                       Reuel Boom, PharmD, BCPS [phone] 08/11/2019, 1:55 AM

## 2019-08-11 NOTE — ED Notes (Signed)
MD made aware that this pt has been spiking temps of 103.5-103.9.administered tylenol at [place] and 1555. Pt has some liver dysfunction on last blood draw.  pt has been producing tea colored urine and vomiting. I have administered Zofran IV. Pts HR remains elevated 120bpm-135bpm. Other vitals WNL

## 2019-08-11 NOTE — Progress Notes (Signed)
Assessed IV in right hand. Dressing changed and IV now flushes great.

## 2019-08-11 NOTE — ED Notes (Signed)
Pts room was cleaned, bed linens changed, and new gown given. Pts belongings placed in bags. Pt requested his shirt, phone chargers x 2, notepad, pen, belt to be thrown away bc per pts statement "It has the sickness all over it." Pt witnessed these belongings be placed in the trash by the NT and did not object to this. Pt given beef broth. Pt seems to be tolerating this with no emesis at this time - will continue to monitor. Pt now back in bed, given warm blankets, and no other concerns/needs expressed at this time. RN aware.

## 2019-08-11 NOTE — Progress Notes (Signed)
PHARMACY - PHYSICIAN COMMUNICATION CRITICAL VALUE ALERT - BLOOD CULTURE IDENTIFICATION (BCID)  Cordarious Vieyra is an 24 y.o. male who presented to [facility] on [facility]/10/2019 with a chief complaint of N/V/D, fever, chills  Assessment:  One set of BCx obtained, both bottles with GPC, BCID not performed, await cx  Name of physician (or Provider) Contacted: Dr Dessie Coma  Current antibiotics: Vancomycin, Cefepime, Flagyl  Changes to prescribed antibiotics recommended:  Patient is on recommended antibiotics - No changes needed  No results found for this or any previous visit.  Minda Ditto PharmD 08/11/2019  1:46 PM

## 2019-08-11 NOTE — ED Notes (Signed)
Pt standing in doorway yelling "hello, hello". NT asked what the pt needed and pt responded "I want a coke. Its hot in here." Pt using aggressive tone with NT. Pt not wanting to shut the door. NT kindly explained to the pt that the door needed to be closed bc the pt is on isolation precautions and his covid test is not back yet. Pt agreed to shut the door. RN aware.

## 2019-08-11 NOTE — ED Notes (Signed)
Vanc restarted in 22G in right Hand

## 2019-08-11 NOTE — ED Notes (Signed)
Pt reoriented to place and situation

## 2019-08-11 NOTE — ED Notes (Signed)
Pt called out to nursing stations, cursing staff. Pt states "get this shit off of me. Im frustrated" Pt verbally aggressive with staff. Pt removed all monitoring equipment. Pt updated on plan of care. Pt has dislodged IV. IV team consulted.

## 2019-08-11 NOTE — Progress Notes (Signed)
PROGRESS NOTE    Justin Chaney  [id number] DOB: Dec 28, 1995 DOA: 08/10/2019 PCP: Patient, No Pcp Per  Brief Narrative:  H&P per Dr. Griffin Dakin is a 24 y.o. male with history of bipolar disorder, history of VP shunt, schizophrenia and IV drug abuse presents to the ER because of fever chills with nausea vomiting diarrhea shortness of breath ongoing for last 1 week.  Patient states he has not been taking any of his psychiatric medication for many months.  He has been having generalized body ache denies any Covid positive contacts.  He has been short of breath even at rest with some nonproductive cough headache is mostly frontal.  Denies any weakness of the upper or lower extremities.  ED Course: In the ER patient was febrile with temperature 103 F initially tachycardic to 130s.  Blood pressure was in the low normal improved with fluid bolus.  Labs reveal potassium 2.8 sodium 133 creatinine 0.78 lactic acid 3.5 improved with fluids CBC was remarkable for low platelets of 42 with hemoglobin of 11.7.  CT abdomen pelvis and CT chest was unremarkable.  Covid test is pending.  Patient was empirically started on antibiotics for possible loading sepsis of unknown source.  Since patient's LFTs were mildly elevated at around 56 and 52 sonogram of the abdomen was done which was unremarkable.   Assessment & Plan:   Principal Problem:   SIRS (systemic inflammatory response syndrome) (HCC) Active Problems:   Schizoaffective disorder, bipolar type (HCC)   Pancytopenia ([place])   1. SIRS source not clear. Has GPC Bacteremia 1. Blood cultures showing GPC already, will likely need repeat BCX after 48 hours 2. Continue vanc cefepime, flagyl, but will descalate based on sensitivities 3. Covid test is Negativd.  4. Since patient has history of VP shunt I have ordered CT head.  Continue with IV fluids follow lactate.  5. Has target cells, no schistocytes 6. All labs show markers of  inflammation, no anemia 2. Anemia and thrombocytopenia which appears to be new 1. No shistocytes, monitor 3. Mildly elevated LFTs likely from sepsis 1. Sonogram of the abdomen was unremarkable.   2. Will follow LFTs  3. Check acute hepatitis panel. 4. History of drug abuse patient states he has not had any IV drugs more than 1 month. 5. History of schizophrenia -patient states he has not taken any of his medications for many months.  Check lithium levels.  DVT prophylaxis: SCDs due to thrombocytopenia. Code Status: Full code. Family Communication: Try to reach patient's mother. Disposition Plan: Home.  When stable. Consults called: None. Admission status: Inpatient.   Subjective: No acute events overnight. Refuses to answer questions "states you can ask the other doctors who asked me"   Objective: Vitals:   08/11/19 0942 08/11/19 1115 08/11/19 1135 08/11/19 1234  BP: (!) 106/53  108/65   Pulse: (!) 130 (!) 120 (!) 119 (!) 109  Resp: (!) 22  20   Temp: (!) 103.9 F (39.9 C)  100.1 F (37.8 C)   TempSrc:   Oral   SpO2: 100% 95% 95% 95%   No intake or output data in the 24 hours ending 08/11/19 1336 There were no vitals filed for this visit.  Examination:  General exam: Appears calm and comfortable  Respiratory system: Clear to auscultation. Respiratory effort normal. Cardiovascular system: S1 & S2 heard, RRR. No JVD, murmurs, rubs, gallops or clicks. No pedal edema. Gastrointestinal system: Abdomen is nondistended, soft and nontender. No organomegaly or masses felt. Normal  bowel sounds heard. Central nervous system: Alert and oriented. No focal neurological deficits. Extremities: Symmetric 5 x 5 power. Skin: No rashes, lesions or ulcers Psychiatry: Judgement and insight appear normal. Mood & affect appropriate.     Data Reviewed: I have personally reviewed following labs and imaging studies  CBC: Recent Labs  Lab 08/11/19 0003 08/11/19 0556 08/11/19 0647  WBC  4.3 9.0  --   NEUTROABS 3.6 7.7  --   HGB 11.7* 14.5  --   HCT 35.9* 45.3  --   MCV 84.3 85.0  --   PLT 42* 121* 97*   Basic Metabolic Panel: Recent Labs  Lab 08/11/19 0003 08/11/19 0556  NA 133* 134*  K 2.8* 3.2*  CL 96* 98  CO2 25 24  GLUCOSE 110* 104*  BUN 11 9  CREATININE 0.78 0.79  CALCIUM 8.5* 8.4*  MG  --  2.0   GFR: CrCl cannot be calculated (Unknown ideal weight.). Liver Function Tests: Recent Labs  Lab 08/11/19 0003 08/11/19 0556  AST 56* 57*  ALT 52* 54*  ALKPHOS 111 110  BILITOT 7.6* 7.5*  PROT 7.3 7.7  ALBUMIN 3.4* 3.4*   Recent Labs  Lab 08/11/19 0003  LIPASE 20   No results for input(s): AMMONIA in the last 168 hours. Coagulation Profile: Recent Labs  Lab 08/11/19 0647  INR 1.2   Cardiac Enzymes: Recent Labs  Lab 08/11/19 0800  CKTOTAL 827*   BNP (last 3 results) No results for input(s): PROBNP in the last 8760 hours. HbA1C: No results for input(s): HGBA1C in the last 72 hours. CBG: No results for input(s): GLUCAP in the last 168 hours. Lipid Profile: No results for input(s): CHOL, HDL, LDLCALC, TRIG, CHOLHDL, LDLDIRECT in the last 72 hours. Thyroid Function Tests: No results for input(s): TSH, T4TOTAL, FREET4, T3FREE, THYROIDAB in the last 72 hours. Anemia Panel: Recent Labs    08/11/19 0800  FERRITIN 1,042*   Sepsis Labs: Recent Labs  Lab 08/11/19 0003 08/11/19 0111 08/11/19 0409 08/11/19 0647  LATICACIDVEN 3.5* 2.5* 2.4* 2.4*    Recent Results (from the past 240 hour(s))  Blood culture (routine x 2)     Status: None (Preliminary result)   Collection Time: 08/11/19 12:03 AM   Specimen: BLOOD  Result Value Ref Range Status   Specimen Description   Final    BLOOD RIGHT ARM Performed at [facility] [address], [place], [place] [address]    Special Requests   Final    BOTTLES DRAWN AEROBIC AND ANAEROBIC Blood Culture adequate volume Performed at [facility]  [address], [place], [place] [address]    Culture  Setup Time   Final    GRAM POSITIVE COCCI IN CLUSTERS IN BOTH AEROBIC AND ANAEROBIC BOTTLES CRITICAL RESULT CALLED TO, READ BACK BY AND VERIFIED WITH: M. HICKS PHARMD, AT 1334 08/11/19 BY Rush Landmark Performed at [facility], [place] [address], [place], [place] [address]    Culture GRAM POSITIVE COCCI  Final   Report Status PENDING  Incomplete         Radiology Studies: CT HEAD WO CONTRAST  Result Date: 08/11/2019 CLINICAL DATA:  Headache EXAM: CT HEAD WITHOUT CONTRAST TECHNIQUE: Contiguous axial images were obtained from the base of the skull through the vertex without intravenous contrast. COMPARISON:  May 8, 20163 FINDINGS: Brain: The ventricles are normal in size and configuration. There is no intracranial mass, hemorrhage, extra-axial fluid collection, or midline shift. The brain parenchyma  appears unremarkable. No acute infarct is demonstrable. Vascular: No hyperdense vessel.  No evident vascular calcification. Skull: There is a burr hole in the left frontal region. Previous shunt tubing in this area is no longer evident. Bony calvarium otherwise appears intact. Sinuses/Orbits: There is mucosal thickening involving several ethmoid air cells. Other paranasal sinuses are clear. Orbits appear symmetric bilaterally. Other: Mastoid air cells are clear. IMPRESSION: Postoperative change involving the left frontal bone. No mass or hemorrhage. Brain parenchyma appears unremarkable. There is mucosal thickening in several ethmoid air cells. Electronically Signed   By: Lowella Grip III M.D.   On: 08/11/2019 07:58   CT Angio Chest PE W and/or Wo Contrast  Result Date: 08/11/2019 CLINICAL DATA:  Chills, decreased appetite and body aches. Nausea, vomiting and diarrhea. EXAM: CT ANGIOGRAPHY CHEST CT ABDOMEN AND PELVIS WITH CONTRAST TECHNIQUE: Multidetector CT imaging of the chest was performed using the standard protocol during bolus administration of  intravenous contrast. Multiplanar CT image reconstructions and MIPs were obtained to evaluate the vascular anatomy. Multidetector CT imaging of the abdomen and pelvis was performed using the standard protocol during bolus administration of intravenous contrast. CONTRAST:  147mL OMNIPAQUE IOHEXOL 350 MG/ML SOLN COMPARISON:  None. FINDINGS: CTA CHEST FINDINGS Cardiovascular: Contrast injection is sufficient to demonstrate satisfactory opacification of the pulmonary arteries to the segmental level; however, examination is degraded by motion.There is no pulmonary embolus. The main pulmonary artery is within normal limits for size. There is no CT evidence of acute right heart strain. The visualized aorta is normal. Heart size is normal, without pericardial effusion. Mediastinum/Nodes: No mediastinal, hilar or axillary lymphadenopathy. The visualized thyroid and thoracic esophageal course are unremarkable. Lungs/Pleura: No pulmonary nodules or masses. No pleural effusion or pneumothorax. No focal airspace consolidation. No focal pleural abnormality. Musculoskeletal: No chest wall abnormality. No acute or significant osseous findings. Review of the MIP images confirms the above findings. CT ABDOMEN and PELVIS FINDINGS Hepatobiliary: Normal hepatic contours and density. No visible biliary dilatation. Normal gallbladder. Pancreas: Normal contours without ductal dilatation. No peripancreatic fluid collection. Spleen: Normal. Adrenals/Urinary Tract: --Adrenal glands: Normal. --Right kidney/ureter: No hydronephrosis or perinephric stranding. No nephrolithiasis. No obstructing ureteral stones. --Left kidney/ureter: No hydronephrosis or perinephric stranding. No nephrolithiasis. No obstructing ureteral stones. --Urinary bladder: Unremarkable. Stomach/Bowel: --Stomach/Duodenum: No hiatal hernia or other gastric abnormality. Normal duodenal course and caliber. --Small bowel: No dilatation or inflammation. --Colon: No focal  abnormality. --Appendix: Normal. Vascular/Lymphatic: Normal course and caliber of the major abdominal vessels. No abdominal or pelvic lymphadenopathy. Reproductive: Normal prostate and seminal vesicles. Musculoskeletal. No bony spinal canal stenosis or focal osseous abnormality. Other: None. IMPRESSION: 1. Motion degraded examination without pulmonary embolus to the segmental level. 2. No acute abnormality of the chest, abdomen or pelvis. 3. 1.5 cm nodular opacity of the medial left lower lobe. Consider one of the following in 3 months for both low-risk and high-risk individuals: (a) repeat chest CT, (b) follow-up PET-CT, or (c) tissue sampling. This recommendation follows the consensus statement: Guidelines for Management of Incidental Pulmonary Nodules Detected on CT Images: From the Fleischner Society 2017; Radiology 2017; 284:228-243. Electronically Signed   By: Ulyses Jarred M.D.   On: 08/11/2019 04:21   CT ABDOMEN PELVIS W CONTRAST  Result Date: 08/11/2019 CLINICAL DATA:  Chills, decreased appetite and body aches. Nausea, vomiting and diarrhea. EXAM: CT ANGIOGRAPHY CHEST CT ABDOMEN AND PELVIS WITH CONTRAST TECHNIQUE: Multidetector CT imaging of the chest was performed using the standard protocol during bolus administration of intravenous contrast. Multiplanar  CT image reconstructions and MIPs were obtained to evaluate the vascular anatomy. Multidetector CT imaging of the abdomen and pelvis was performed using the standard protocol during bolus administration of intravenous contrast. CONTRAST:  136mL OMNIPAQUE IOHEXOL 350 MG/ML SOLN COMPARISON:  None. FINDINGS: CTA CHEST FINDINGS Cardiovascular: Contrast injection is sufficient to demonstrate satisfactory opacification of the pulmonary arteries to the segmental level; however, examination is degraded by motion.There is no pulmonary embolus. The main pulmonary artery is within normal limits for size. There is no CT evidence of acute right heart strain. The  visualized aorta is normal. Heart size is normal, without pericardial effusion. Mediastinum/Nodes: No mediastinal, hilar or axillary lymphadenopathy. The visualized thyroid and thoracic esophageal course are unremarkable. Lungs/Pleura: No pulmonary nodules or masses. No pleural effusion or pneumothorax. No focal airspace consolidation. No focal pleural abnormality. Musculoskeletal: No chest wall abnormality. No acute or significant osseous findings. Review of the MIP images confirms the above findings. CT ABDOMEN and PELVIS FINDINGS Hepatobiliary: Normal hepatic contours and density. No visible biliary dilatation. Normal gallbladder. Pancreas: Normal contours without ductal dilatation. No peripancreatic fluid collection. Spleen: Normal. Adrenals/Urinary Tract: --Adrenal glands: Normal. --Right kidney/ureter: No hydronephrosis or perinephric stranding. No nephrolithiasis. No obstructing ureteral stones. --Left kidney/ureter: No hydronephrosis or perinephric stranding. No nephrolithiasis. No obstructing ureteral stones. --Urinary bladder: Unremarkable. Stomach/Bowel: --Stomach/Duodenum: No hiatal hernia or other gastric abnormality. Normal duodenal course and caliber. --Small bowel: No dilatation or inflammation. --Colon: No focal abnormality. --Appendix: Normal. Vascular/Lymphatic: Normal course and caliber of the major abdominal vessels. No abdominal or pelvic lymphadenopathy. Reproductive: Normal prostate and seminal vesicles. Musculoskeletal. No bony spinal canal stenosis or focal osseous abnormality. Other: None. IMPRESSION: 1. Motion degraded examination without pulmonary embolus to the segmental level. 2. No acute abnormality of the chest, abdomen or pelvis. 3. 1.5 cm nodular opacity of the medial left lower lobe. Consider one of the following in 3 months for both low-risk and high-risk individuals: (a) repeat chest CT, (b) follow-up PET-CT, or (c) tissue sampling. This recommendation follows the consensus  statement: Guidelines for Management of Incidental Pulmonary Nodules Detected on CT Images: From the Fleischner Society 2017; Radiology 2017; 284:228-243. Electronically Signed   By: Ulyses Jarred M.D.   On: 08/11/2019 04:21   DG Chest Portable 1 View  Result Date: 08/10/2019 CLINICAL DATA:  Fever and cough EXAM: PORTABLE CHEST 1 VIEW COMPARISON:  11/17/2015 FINDINGS: Patient rotation exaggerate the cardiomediastinal silhouette. No consolidation or effusion. No pneumothorax. IMPRESSION: No active disease allowing for patient rotation Electronically Signed   By: Donavan Foil M.D.   On: 08/10/2019 23:43   US Abdomen Limited RUQ  Result Date: 08/11/2019 CLINICAL DATA:  Elevated transaminase level EXAM: ULTRASOUND ABDOMEN LIMITED RIGHT UPPER QUADRANT COMPARISON:  None. FINDINGS: Gallbladder: No gallstones or wall thickening visualized. No sonographic Murphy sign noted by sonographer. Common bile duct: Diameter: 3 mm Liver: No focal lesion identified. Within normal limits in parenchymal echogenicity. Portal vein is patent on color Doppler imaging with normal direction of blood flow towards the liver. Other: None. IMPRESSION: Normal examination Electronically Signed   By: Prudencio Pair M.D.   On: 08/11/2019 02:30        Scheduled Meds: Continuous Infusions: . ceFEPime (MAXIPIME) IV 2 g (08/11/19 1324)  . lactated ringers with KCl 20 mEq/L    . metronidazole    . vancomycin 166.7 mL/hr at 08/11/19 1235     LOS: 0 days   Time spent: 25 minutes   Arma Heading, MD [facility] Pager  [phone]  If 7PM-7AM, please contact night-coverage [email or web address] Password [email or web address] 08/11/2019, 1:36 PM

## 2019-08-11 NOTE — Progress Notes (Signed)
Pharmacy Antibiotic Note  Justin Chaney is a 24 y.o. male admitted on 08/10/2019 with sepsis.  Pharmacy has been consulted for vancomycin and cefepime dosing.  Plan:  Vancomycin 1000 mg IV now, then 1250 mg IV q12 hr (est AUC 493 based on SCr 0.8; Vd 0.5)  Measure vancomycin AUC at steady state as indicated  SCr q48 while on vanc  Cefepime 2 g IV q8 hr     Temp (24hrs), Avg:102.4 F (39.1 C), Min:101.5 F (38.6 C), Max:103.3 F (39.6 C)  Recent Labs  Lab 08/11/19 0003 08/11/19 0111  WBC 4.3  --   CREATININE 0.78  --   LATICACIDVEN 3.5* 2.5*    CrCl cannot be calculated (Unknown ideal weight.).    Allergies  Allergen Reactions  . Amoxicillin Hives and Other (See Comments)    CHILDHOOD ALLERGY - Per [facility] records, tolerates Ancef and Rocephin Has patient had a PCN reaction causing immediate rash, facial/tongue/throat swelling, SOB or lightheadedness with hypotension: YES Has patient had a PCN reaction causing severe rash involving mucus membranes or skin necrosis: No Has patient had a PCN reaction that required hospitalization No     Thank you for allowing pharmacy to be a part of this patient's care.  Shweta Aman A 08/11/2019 3:32 AM

## 2019-08-12 ENCOUNTER — Inpatient Hospital Stay (HOSPITAL_COMMUNITY): Payer: Medicaid Other

## 2019-08-12 DIAGNOSIS — F17290 Nicotine dependence, other tobacco product, uncomplicated: Secondary | ICD-10-CM

## 2019-08-12 DIAGNOSIS — F1910 Other psychoactive substance abuse, uncomplicated: Secondary | ICD-10-CM | POA: Diagnosis present

## 2019-08-12 DIAGNOSIS — F250 Schizoaffective disorder, bipolar type: Secondary | ICD-10-CM

## 2019-08-12 DIAGNOSIS — R7881 Bacteremia: Secondary | ICD-10-CM | POA: Diagnosis present

## 2019-08-12 DIAGNOSIS — Z881 Allergy status to other antibiotic agents status: Secondary | ICD-10-CM

## 2019-08-12 DIAGNOSIS — L98499 Non-pressure chronic ulcer of skin of other sites with unspecified severity: Secondary | ICD-10-CM

## 2019-08-12 DIAGNOSIS — I361 Nonrheumatic tricuspid (valve) insufficiency: Secondary | ICD-10-CM

## 2019-08-12 DIAGNOSIS — Z982 Presence of cerebrospinal fluid drainage device: Secondary | ICD-10-CM

## 2019-08-12 DIAGNOSIS — D696 Thrombocytopenia, unspecified: Secondary | ICD-10-CM | POA: Diagnosis present

## 2019-08-12 DIAGNOSIS — F1990 Other psychoactive substance use, unspecified, uncomplicated: Secondary | ICD-10-CM | POA: Diagnosis present

## 2019-08-12 DIAGNOSIS — R7401 Elevation of levels of liver transaminase levels: Secondary | ICD-10-CM | POA: Diagnosis present

## 2019-08-12 DIAGNOSIS — B9561 Methicillin susceptible Staphylococcus aureus infection as the cause of diseases classified elsewhere: Secondary | ICD-10-CM

## 2019-08-12 LAB — CBC
HCT: 32.4 % — ABNORMAL LOW (ref 39.0–52.0)
Hemoglobin: 10.4 g/dL — ABNORMAL LOW (ref 13.0–17.0)
MCH: 26.9 pg (ref 26.0–34.0)
MCHC: 32.1 g/dL (ref 30.0–36.0)
MCV: 83.9 fL (ref 80.0–100.0)
Platelets: 107 10*3/uL — ABNORMAL LOW (ref 150–400)
RBC: 3.86 MIL/uL — ABNORMAL LOW (ref 4.22–5.81)
RDW: 13.1 % (ref 11.5–15.5)
WBC: 10.6 10*3/uL — ABNORMAL HIGH (ref 4.0–10.5)
nRBC: 0 % (ref 0.0–0.2)

## 2019-08-12 LAB — ECHOCARDIOGRAM COMPLETE
Height: 62 in
Weight: 3647.29 oz

## 2019-08-12 LAB — COMPREHENSIVE METABOLIC PANEL
ALT: 35 U/L (ref 0–44)
AST: 38 U/L (ref 15–41)
Albumin: 2.7 g/dL — ABNORMAL LOW (ref 3.5–5.0)
Alkaline Phosphatase: 71 U/L (ref 38–126)
Anion gap: 10 (ref 5–15)
BUN: 8 mg/dL (ref 6–20)
CO2: 23 mmol/L (ref 22–32)
Calcium: 7.9 mg/dL — ABNORMAL LOW (ref 8.9–10.3)
Chloride: 100 mmol/L (ref 98–111)
Creatinine, Ser: 0.67 mg/dL (ref 0.61–1.24)
GFR calc Af Amer: >60 mL/min (ref >60)
GFR calc non Af Amer: >60 mL/min (ref >60)
Glucose, Bld: 137 mg/dL — ABNORMAL HIGH (ref 70–99)
Potassium: 3 mmol/L — ABNORMAL LOW (ref 3.5–5.1)
Sodium: 133 mmol/L — ABNORMAL LOW (ref 135–145)
Total Bilirubin: 5 mg/dL — ABNORMAL HIGH (ref 0.3–1.2)
Total Protein: 5.9 g/dL — ABNORMAL LOW (ref 6.5–8.1)

## 2019-08-12 LAB — CK: Total CK: 471 U/L — ABNORMAL HIGH (ref 49–397)

## 2019-08-12 LAB — LACTIC ACID, PLASMA
Lactic Acid, Venous: 1.8 mmol/L (ref 0.5–1.9)
Lactic Acid, Venous: 1.5 mmol/L (ref 0.5–1.9)

## 2019-08-12 LAB — PHOSPHORUS: Phosphorus: 1.1 mg/dL — ABNORMAL LOW (ref 2.5–4.6)

## 2019-08-12 LAB — MAGNESIUM: Magnesium: 2.2 mg/dL (ref 1.7–2.4)

## 2019-08-12 LAB — MRSA PCR SCREENING: MRSA by PCR: NEGATIVE

## 2019-08-12 MED ORDER — LIP MEDEX EX OINT
1.0000 "application " | TOPICAL_OINTMENT | CUTANEOUS | Status: DC | PRN
  Filled 2019-08-12: qty 7

## 2019-08-12 MED ORDER — GUAIFENESIN-DM 100-10 MG/5ML PO SYRP
5.0000 mL | ORAL_SOLUTION | ORAL | Status: DC | PRN
  Administered 2019-08-12 – 2019-08-29 (×4): 5 mL via ORAL
  Filled 2019-08-12 (×5): qty 10

## 2019-08-12 MED ORDER — LACTATED RINGERS IV BOLUS
500.0000 mL | Freq: Once | INTRAVENOUS | Status: AC
  Administered 2019-08-12: 500 mL via INTRAVENOUS

## 2019-08-12 MED ORDER — POTASSIUM & SODIUM PHOSPHATES 280-160-250 MG PO PACK
2.0000 | PACK | Freq: Three times a day (TID) | ORAL | Status: DC
  Administered 2019-08-12 – 2019-08-18 (×24): 2 via ORAL
  Filled 2019-08-12 (×29): qty 2

## 2019-08-12 MED ORDER — POTASSIUM CHLORIDE 2 MEQ/ML IV SOLN
INTRAVENOUS | Status: DC
  Filled 2019-08-12 (×3): qty 1000

## 2019-08-12 MED ORDER — POTASSIUM CHLORIDE 20 MEQ/15ML (10%) PO SOLN
20.0000 meq | ORAL | Status: AC
  Administered 2019-08-12 (×3): 20 meq
  Filled 2019-08-12 (×2): qty 15

## 2019-08-12 MED ORDER — TRAZODONE HCL 50 MG PO TABS
50.0000 mg | ORAL_TABLET | Freq: Once | ORAL | Status: AC
  Administered 2019-08-12: 50 mg via ORAL
  Filled 2019-08-12: qty 1

## 2019-08-12 MED ORDER — LACTATED RINGERS IV SOLN: INTRAVENOUS | Status: AC

## 2019-08-12 MED ORDER — ORAL CARE MOUTH RINSE
15.0000 mL | Freq: Two times a day (BID) | OROMUCOSAL | Status: DC
  Administered 2019-08-12 – 2019-09-04 (×27): 15 mL via OROMUCOSAL

## 2019-08-12 MED ORDER — CEFAZOLIN SODIUM-DEXTROSE 2-4 GM/100ML-% IV SOLN
2.0000 g | Freq: Three times a day (TID) | INTRAVENOUS | Status: DC
  Administered 2019-08-12 – 2019-09-10 (×85): 2 g via INTRAVENOUS
  Filled 2019-08-12 (×88): qty 100

## 2019-08-12 NOTE — Progress Notes (Signed)
PROGRESS NOTE    Justin Chaney  [id number] DOB: 1995/11/18 DOA: 08/10/2019 PCP: Patient, No Pcp Per  Brief Narrative:  H&P per Dr. Griffin Dakin is a 24 y.o. male with history of bipolar disorder, history of VP shunt, schizophrenia and IV drug abuse presents to the ER because of fever chills with nausea vomiting diarrhea shortness of breath ongoing for last 1 week.  Patient states he has not been taking any of his psychiatric medication for many months.  He has been having generalized body ache denies any Covid positive contacts.  He has been short of breath even at rest with some nonproductive cough headache is mostly frontal.  Denies any weakness of the upper or lower extremities.  ED Course: In the ER patient was febrile with temperature 103 F initially tachycardic to 130s.  Blood pressure was in the low normal improved with fluid bolus.  Labs reveal potassium 2.8 sodium 133 creatinine 0.78 lactic acid 3.5 improved with fluids CBC was remarkable for low platelets of 42 with hemoglobin of 11.7.  CT abdomen pelvis and CT chest was unremarkable.  Covid test is pending.  Patient was empirically started on antibiotics for possible loading sepsis of unknown source.  Since patient's LFTs were mildly elevated at around 56 and 52 sonogram of the abdomen was done which was unremarkable.  1/10: Growing Staph Aureus in blood, seen by ID consults, pending ehco, will get repeat blood cultures, cont vanc, start cefazolin stop flagyl and cefepime   Assessment & Plan:   Principal Problem:   Staphylococcus aureus bacteremia Active Problems:   Schizoaffective disorder, bipolar type (HCC)   Tobacco use   SIRS (systemic inflammatory response syndrome) (HCC)   Normocytic anemia   Thrombocytopenia (HCC)   Polysubstance abuse (HCC)   IVDU (intravenous drug user)   S/P VP shunt   Transaminitis   Hyperbilirubinemia   1. Staph Aureus bacteremia 1. I consults, apprec recs 2. pending  echocardiogram 3. Blood cultures showing GPC already, will likely need repeat BCX after 48 hours 4. Cont vanc and start cefazolin per ID recs 5. Covid test is Negativd.  6. All labs show markers of inflammation, no anemia, likely from sepsis from bacteremia 2. Anemia and thrombocytopenia which appears to be new 1. No shistocytes, monitor 3. Mildly elevated LFTs likely from sepsis 1. Sonogram of the abdomen was unremarkable.   2. Will follow LFTs  3. Check acute hepatitis panel. 4. History of drug abuse patient states he has not had any IV drugs more than 1 month. 5. History of schizophrenia -patient states he has not taken any of his medications for many months.  Check lithium levels.  DVT prophylaxis: SCDs due to thrombocytopenia. Can consider lovenox tomorrow Code Status: Full code. Family Communication: Discussed with patient bedside Disposition Plan: Home.  When stable. Will cont IV ABX Consults called: None. Admission status: Inpatient.   Subjective: Patient continues to not feel well and be agitated with staff. Encouraged to staff in bed and ask nursing staff for questions. bloodp ressure soft today, will give bolus of IV fluids as patient oral intake quite minimal. Otherwise no complaints.   Objective: Vitals:   08/12/19 1038 08/12/19 1044 08/12/19 1100 08/12/19 1200  BP: (!) 93/39 (!) 85/33 (!) 111/43 (!) 115/39  Pulse:   (!) 109 (!) 106  Resp:   (!) 28 (!) 27  Temp:      TempSrc:      SpO2:   99% 98%  Weight:  Height:        Intake/Output Summary (Last 24 hours) at 08/12/2019 1308 Last data filed at 08/12/2019 1200 Gross per 24 hour  Intake 2519.29 ml  Output --  Net 2519.29 ml   Filed Weights   08/12/19 0000  Weight: 103.4 kg    Examination:  General exam: Appears calm but slightly uncomfortable, warm to the touch Respiratory system: Clear to auscultation. Respiratory effort normal. Cardiovascular system: S1 & S2 heard, RRR. No JVD, murmurs, rubs,  gallops or clicks. No pedal edema. Gastrointestinal system: Abdomen is nondistended, soft and nontender. No organomegaly or masses felt. Normal bowel sounds heard. Central nervous system: Alert and oriented. No focal neurological deficits. Extremities: Symmetric 5 x 5 power. Skin: No rashes, lesions or ulcers Psychiatry: Judgement and insight appear normal. Mood & affect appropriate.    Data Reviewed: I have personally reviewed following labs and imaging studies  CBC: Recent Labs  Lab 08/11/19 0003 08/11/19 0556 08/11/19 0647 08/12/19 0120  WBC 4.3 9.0  --  10.6*  NEUTROABS 3.6 7.7  --   --   HGB 11.7* 14.5  --  10.4*  HCT 35.9* 45.3  --  32.4*  MCV 84.3 85.0  --  83.9  PLT 42* 121* 97* [id number]*   Basic Metabolic Panel: Recent Labs  Lab 08/11/19 0003 08/11/19 0556 08/12/19 0120  NA 133* 134* 133*  K 2.8* 3.2* 3.0*  CL 96* 98 100  CO2 25 24 23   GLUCOSE 110* 104* 137*  BUN 11 9 8   CREATININE 0.78 0.79 0.67  CALCIUM 8.5* 8.4* 7.9*  MG  --  2.0 2.2  PHOS  --   --  1.1*   GFR: Estimated Creatinine Clearance: 147.9 mL/min (by C-G formula based on SCr of 0.67 mg/dL). Liver Function Tests: Recent Labs  Lab 08/11/19 0003 08/11/19 0556 08/12/19 0120  AST 56* 57* 38  ALT 52* 54* 35  ALKPHOS 111 110 71  BILITOT 7.6* 7.5* 5.0*  PROT 7.3 7.7 5.9*  ALBUMIN 3.4* 3.4* 2.7*   Recent Labs  Lab 08/11/19 0003  LIPASE 20   No results for input(s): AMMONIA in the last 168 hours. Coagulation Profile: Recent Labs  Lab 08/11/19 0647  INR 1.2   Cardiac Enzymes: Recent Labs  Lab 08/11/19 0800 08/12/19 0820  CKTOTAL 827* 471*   BNP (last 3 results) No results for input(s): PROBNP in the last 8760 hours. HbA1C: No results for input(s): HGBA1C in the last 72 hours. CBG: No results for input(s): GLUCAP in the last 168 hours. Lipid Profile: No results for input(s): CHOL, HDL, LDLCALC, TRIG, CHOLHDL, LDLDIRECT in the last 72 hours. Thyroid Function Tests: No results for  input(s): TSH, T4TOTAL, FREET4, T3FREE, THYROIDAB in the last 72 hours. Anemia Panel: Recent Labs    08/11/19 0800  FERRITIN 1,042*   Sepsis Labs: Recent Labs  Lab 08/11/19 0111 08/11/19 0409 08/11/19 0647 08/12/19 0932  LATICACIDVEN 2.5* 2.4* 2.4* 1.8    Recent Results (from the past 240 hour(s))  Blood culture (routine x 2)     Status: Abnormal (Preliminary result)   Collection Time: 08/11/19 12:03 AM   Specimen: BLOOD  Result Value Ref Range Status   Specimen Description   Final    BLOOD RIGHT ARM Performed at [facility] [address] [place], [place] [address]    Special Requests   Final    BOTTLES DRAWN AEROBIC AND ANAEROBIC Blood Culture adequate volume Performed at [address] [place], [place] [address]    Culture  Setup Time   Final    GRAM POSITIVE COCCI IN CLUSTERS IN BOTH AEROBIC AND ANAEROBIC BOTTLES CRITICAL RESULT CALLED TO, READ BACK BY AND VERIFIED WITH: M. HICKS PHARMD, AT 1334 08/11/19 BY Rush Landmark Performed at [facility], [place] [address] [place], [place] [address]    Culture STAPHYLOCOCCUS AUREUS (A)  Final   Report Status PENDING  Incomplete  SARS CORONAVIRUS 2 (TAT 6-24 HRS) Nasopharyngeal Nasopharyngeal Swab     Status: None   Collection Time: 08/11/19  4:09 AM   Specimen: Nasopharyngeal Swab  Result Value Ref Range Status   SARS Coronavirus 2 NEGATIVE NEGATIVE Final    Comment: (NOTE) SARS-CoV-2 target nucleic acids are NOT DETECTED. The SARS-CoV-2 RNA is generally detectable in upper and lower respiratory specimens during the acute phase of infection. Negative results do not preclude SARS-CoV-2 infection, do not rule out co-infections with other pathogens, and should not be used as the sole basis for treatment or other patient management decisions. Negative results must be combined with clinical observations, patient history, and epidemiological information. The  expected result is Negative. Fact Sheet for Patients: SugarRoll.be Fact Sheet for Healthcare Providers: https://www.woods-mathews.com/ This test is not yet approved or cleared by the [place] FDA and  has been authorized for detection and/or diagnosis of SARS-CoV-2 by FDA under an Emergency Use Authorization (EUA). This EUA will remain  in effect (meaning this test can be used) for the duration of the COVID-19 declaration under Section 56 4(b)(1) of the Act, 21 U.S.C. section 360bbb-3(b)(1), unless the authorization is terminated or revoked sooner. Performed at [facility], [name] [address], [place], [place] [address]   MRSA PCR Screening     Status: None   Collection Time: 08/11/19 11:00 PM   Specimen: Nasopharyngeal  Result Value Ref Range Status   MRSA by PCR NEGATIVE NEGATIVE Final    Comment:        The GeneXpert MRSA Assay (FDA approved for NASAL specimens only), is one component of a comprehensive MRSA colonization surveillance program. It is not intended to diagnose MRSA infection nor to guide or monitor treatment for MRSA infections. Performed at [facility], [place] [address] [place], [place] [address]          Radiology Studies: CT HEAD WO CONTRAST  Result Date: 08/11/2019 CLINICAL DATA:  Headache EXAM: CT HEAD WITHOUT CONTRAST TECHNIQUE: Contiguous axial images were obtained from the base of the skull through the vertex without intravenous contrast. COMPARISON:  May 8, 20163 FINDINGS: Brain: The ventricles are normal in size and configuration. There is no intracranial mass, hemorrhage, extra-axial fluid collection, or midline shift. The brain parenchyma appears unremarkable. No acute infarct is demonstrable. Vascular: No hyperdense vessel.  No evident vascular calcification. Skull: There is a burr hole in the left frontal region. Previous shunt tubing in this area is no longer evident. Bony  calvarium otherwise appears intact. Sinuses/Orbits: There is mucosal thickening involving several ethmoid air cells. Other paranasal sinuses are clear. Orbits appear symmetric bilaterally. Other: Mastoid air cells are clear. IMPRESSION: Postoperative change involving the left frontal bone. No mass or hemorrhage. Brain parenchyma appears unremarkable. There is mucosal thickening in several ethmoid air cells. Electronically Signed   By: Lowella Grip III M.D.   On: 08/11/2019 07:58   CT Angio Chest PE W and/or Wo Contrast  Result Date: 08/11/2019 CLINICAL DATA:  Chills, decreased appetite and body aches. Nausea, vomiting  and diarrhea. EXAM: CT ANGIOGRAPHY CHEST CT ABDOMEN AND PELVIS WITH CONTRAST TECHNIQUE: Multidetector CT imaging of the chest was performed using the standard protocol during bolus administration of intravenous contrast. Multiplanar CT image reconstructions and MIPs were obtained to evaluate the vascular anatomy. Multidetector CT imaging of the abdomen and pelvis was performed using the standard protocol during bolus administration of intravenous contrast. CONTRAST:  141mL OMNIPAQUE IOHEXOL 350 MG/ML SOLN COMPARISON:  None. FINDINGS: CTA CHEST FINDINGS Cardiovascular: Contrast injection is sufficient to demonstrate satisfactory opacification of the pulmonary arteries to the segmental level; however, examination is degraded by motion.There is no pulmonary embolus. The main pulmonary artery is within normal limits for size. There is no CT evidence of acute right heart strain. The visualized aorta is normal. Heart size is normal, without pericardial effusion. Mediastinum/Nodes: No mediastinal, hilar or axillary lymphadenopathy. The visualized thyroid and thoracic esophageal course are unremarkable. Lungs/Pleura: No pulmonary nodules or masses. No pleural effusion or pneumothorax. No focal airspace consolidation. No focal pleural abnormality. Musculoskeletal: No chest wall abnormality. No acute or  significant osseous findings. Review of the MIP images confirms the above findings. CT ABDOMEN and PELVIS FINDINGS Hepatobiliary: Normal hepatic contours and density. No visible biliary dilatation. Normal gallbladder. Pancreas: Normal contours without ductal dilatation. No peripancreatic fluid collection. Spleen: Normal. Adrenals/Urinary Tract: --Adrenal glands: Normal. --Right kidney/ureter: No hydronephrosis or perinephric stranding. No nephrolithiasis. No obstructing ureteral stones. --Left kidney/ureter: No hydronephrosis or perinephric stranding. No nephrolithiasis. No obstructing ureteral stones. --Urinary bladder: Unremarkable. Stomach/Bowel: --Stomach/Duodenum: No hiatal hernia or other gastric abnormality. Normal duodenal course and caliber. --Small bowel: No dilatation or inflammation. --Colon: No focal abnormality. --Appendix: Normal. Vascular/Lymphatic: Normal course and caliber of the major abdominal vessels. No abdominal or pelvic lymphadenopathy. Reproductive: Normal prostate and seminal vesicles. Musculoskeletal. No bony spinal canal stenosis or focal osseous abnormality. Other: None. IMPRESSION: 1. Motion degraded examination without pulmonary embolus to the segmental level. 2. No acute abnormality of the chest, abdomen or pelvis. 3. 1.5 cm nodular opacity of the medial left lower lobe. Consider one of the following in 3 months for both low-risk and high-risk individuals: (a) repeat chest CT, (b) follow-up PET-CT, or (c) tissue sampling. This recommendation follows the consensus statement: Guidelines for Management of Incidental Pulmonary Nodules Detected on CT Images: From the Fleischner Society 2017; Radiology 2017; 284:228-243. Electronically Signed   By: Ulyses Jarred M.D.   On: 08/11/2019 04:21   CT ABDOMEN PELVIS W CONTRAST  Result Date: 08/11/2019 CLINICAL DATA:  Chills, decreased appetite and body aches. Nausea, vomiting and diarrhea. EXAM: CT ANGIOGRAPHY CHEST CT ABDOMEN AND PELVIS WITH  CONTRAST TECHNIQUE: Multidetector CT imaging of the chest was performed using the standard protocol during bolus administration of intravenous contrast. Multiplanar CT image reconstructions and MIPs were obtained to evaluate the vascular anatomy. Multidetector CT imaging of the abdomen and pelvis was performed using the standard protocol during bolus administration of intravenous contrast. CONTRAST:  13mL OMNIPAQUE IOHEXOL 350 MG/ML SOLN COMPARISON:  None. FINDINGS: CTA CHEST FINDINGS Cardiovascular: Contrast injection is sufficient to demonstrate satisfactory opacification of the pulmonary arteries to the segmental level; however, examination is degraded by motion.There is no pulmonary embolus. The main pulmonary artery is within normal limits for size. There is no CT evidence of acute right heart strain. The visualized aorta is normal. Heart size is normal, without pericardial effusion. Mediastinum/Nodes: No mediastinal, hilar or axillary lymphadenopathy. The visualized thyroid and thoracic esophageal course are unremarkable. Lungs/Pleura: No pulmonary nodules or masses. No pleural effusion or  pneumothorax. No focal airspace consolidation. No focal pleural abnormality. Musculoskeletal: No chest wall abnormality. No acute or significant osseous findings. Review of the MIP images confirms the above findings. CT ABDOMEN and PELVIS FINDINGS Hepatobiliary: Normal hepatic contours and density. No visible biliary dilatation. Normal gallbladder. Pancreas: Normal contours without ductal dilatation. No peripancreatic fluid collection. Spleen: Normal. Adrenals/Urinary Tract: --Adrenal glands: Normal. --Right kidney/ureter: No hydronephrosis or perinephric stranding. No nephrolithiasis. No obstructing ureteral stones. --Left kidney/ureter: No hydronephrosis or perinephric stranding. No nephrolithiasis. No obstructing ureteral stones. --Urinary bladder: Unremarkable. Stomach/Bowel: --Stomach/Duodenum: No hiatal hernia or  other gastric abnormality. Normal duodenal course and caliber. --Small bowel: No dilatation or inflammation. --Colon: No focal abnormality. --Appendix: Normal. Vascular/Lymphatic: Normal course and caliber of the major abdominal vessels. No abdominal or pelvic lymphadenopathy. Reproductive: Normal prostate and seminal vesicles. Musculoskeletal. No bony spinal canal stenosis or focal osseous abnormality. Other: None. IMPRESSION: 1. Motion degraded examination without pulmonary embolus to the segmental level. 2. No acute abnormality of the chest, abdomen or pelvis. 3. 1.5 cm nodular opacity of the medial left lower lobe. Consider one of the following in 3 months for both low-risk and high-risk individuals: (a) repeat chest CT, (b) follow-up PET-CT, or (c) tissue sampling. This recommendation follows the consensus statement: Guidelines for Management of Incidental Pulmonary Nodules Detected on CT Images: From the Fleischner Society 2017; Radiology 2017; 284:228-243. Electronically Signed   By: Ulyses Jarred M.D.   On: 08/11/2019 04:21   DG Chest Portable 1 View  Result Date: 08/10/2019 CLINICAL DATA:  Fever and cough EXAM: PORTABLE CHEST 1 VIEW COMPARISON:  11/17/2015 FINDINGS: Patient rotation exaggerate the cardiomediastinal silhouette. No consolidation or effusion. No pneumothorax. IMPRESSION: No active disease allowing for patient rotation Electronically Signed   By: Donavan Foil M.D.   On: 08/10/2019 23:43   ECHOCARDIOGRAM COMPLETE  Result Date: 08/12/2019   ECHOCARDIOGRAM REPORT   Patient Name:   TAYGEN ROBLEDO Date of Exam: 08/12/2019 Medical Rec #:  [id number]        Height:       62.0 in Accession #:    [id number]       Weight:       228.0 lb Date of Birth:  16-Oct-1995        BSA:          2.02 m Patient Age:    23 years         BP:           111/43 mmHg Patient Gender: M                HR:           108 bpm. Exam Location:  Inpatient Procedure: 2D Echo Indications:    Endocarditis I38  History:         Patient has no prior history of Echocardiogram examinations.                 Signs/Symptoms:Shortness of Breath and Bacteremia; Risk                 Factors:Current Smoker. Hx polysubstance abuse.  Sonographer:    Clayton Lefort RDCS (AE) Referring Phys: [id number] [place]  1. Left ventricular ejection fraction, by visual estimation, is 60 to 65%. The left ventricle has normal function. There is no left ventricular hypertrophy.  2. Global right ventricle has normal systolic function.The right ventricular size is normal.  3. Left atrial size was normal.  4. Right atrial size was  normal.  5. The mitral valve is normal in structure. Trivial mitral valve regurgitation. No evidence of mitral stenosis.  6. The tricuspid valve is normal in structure.  7. The aortic valve is tricuspid. Aortic valve regurgitation is not visualized. No evidence of aortic valve sclerosis or stenosis.  8. The pulmonic valve was not well visualized. Pulmonic valve regurgitation is not visualized.  9. The inferior vena cava is normal in size with greater than 50% respiratory variability, suggesting right atrial pressure of 3 mmHg. 10. Normal LV function; no obvious vegetations. 11. The left ventricle has no regional wall motion abnormalities. FINDINGS  Left Ventricle: Left ventricular ejection fraction, by visual estimation, is 60 to 65%. The left ventricle has normal function. The left ventricle has no regional wall motion abnormalities. There is no left ventricular hypertrophy. Left ventricular diastolic parameters were normal. Normal left atrial pressure. Right Ventricle: The right ventricular size is normal.Global RV systolic function is has normal systolic function. The tricuspid regurgitant velocity is 2.59 m/s, and with an assumed right atrial pressure of 3 mmHg, the estimated right ventricular systolic pressure is normal at 29.9 mmHg. Left Atrium: Left atrial size was normal in size. Right Atrium: Right atrial size was normal  in size Pericardium: There is no evidence of pericardial effusion. Mitral Valve: The mitral valve is normal in structure. Trivial mitral valve regurgitation. No evidence of mitral valve stenosis by observation. MV peak gradient, 5.1 mmHg. Tricuspid Valve: The tricuspid valve is normal in structure. Tricuspid valve regurgitation is mild. Aortic Valve: The aortic valve is tricuspid. Aortic valve regurgitation is not visualized. The aortic valve is structurally normal, with no evidence of sclerosis or stenosis. Aortic valve mean gradient measures 6.0 mmHg. Aortic valve peak gradient measures 13.5 mmHg. Aortic valve area, by VTI measures 2.98 cm. Pulmonic Valve: The pulmonic valve was not well visualized. Pulmonic valve regurgitation is not visualized. Pulmonic regurgitation is not visualized. Aorta: The aortic root is normal in size and structure. Venous: The inferior vena cava is normal in size with greater than 50% respiratory variability, suggesting right atrial pressure of 3 mmHg. IAS/Shunts: No atrial level shunt detected by color flow Doppler. Additional Comments: Normal LV function; no obvious vegetations.  LEFT VENTRICLE PLAX 2D LVOT diam:     2.20 cm  Diastology LVOT Area:     3.80 cm LV e' lateral:   14.70 cm/s                         LV E/e' lateral: 6.9                         LV e' medial:    16.40 cm/s                         LV E/e' medial:  6.2  RIGHT VENTRICLE             IVC RV Basal diam:  2.90 cm     IVC diam: 1.30 cm RV S prime:     13.90 cm/s TAPSE (M-mode): 1.9 cm LEFT ATRIUM             Index       RIGHT ATRIUM           Index LA Vol (A2C):   50.0 ml 24.74 ml/m RA Area:     13.90 cm LA Vol (A4C):   30.8 ml 15.24  ml/m RA Volume:   32.50 ml  16.08 ml/m LA Biplane Vol: 41.7 ml 20.63 ml/m  AORTIC VALVE AV Area (Vmax):    2.95 cm AV Area (Vmean):   3.46 cm AV Area (VTI):     2.98 cm AV Vmax:           184.00 cm/s AV Vmean:          111.000 cm/s AV VTI:            0.242 m AV Peak Grad:       13.5 mmHg AV Mean Grad:      6.0 mmHg LVOT Vmax:         143.00 cm/s LVOT Vmean:        101.000 cm/s LVOT VTI:          0.190 m LVOT/AV VTI ratio: 0.79  AORTA Ao Asc diam: 2.80 cm MITRAL VALVE                         TRICUSPID VALVE MV Area (PHT): 6.54 cm              TR Peak grad:   26.9 mmHg MV Peak grad:  5.1 mmHg              TR Vmax:        277.00 cm/s MV Mean grad:  2.0 mmHg MV Vmax:       1.13 m/s              SHUNTS MV Vmean:      66.0 cm/s             Systemic VTI:  0.19 m MV VTI:        0.19 m                Systemic Diam: 2.20 cm MV PHT:        33.64 msec MV Decel Time: 116 msec MV E velocity: 101.00 cm/s 103 cm/s MV A velocity: 55.20 cm/s  70.3 cm/s MV E/A ratio:  1.83        1.5  Kirk Ruths MD Electronically signed by Kirk Ruths MD Signature Date/Time: 08/12/2019/12:06:58 PM    Final    US Abdomen Limited RUQ  Result Date: 08/11/2019 CLINICAL DATA:  Elevated transaminase level EXAM: ULTRASOUND ABDOMEN LIMITED RIGHT UPPER QUADRANT COMPARISON:  None. FINDINGS: Gallbladder: No gallstones or wall thickening visualized. No sonographic Murphy sign noted by sonographer. Common bile duct: Diameter: 3 mm Liver: No focal lesion identified. Within normal limits in parenchymal echogenicity. Portal vein is patent on color Doppler imaging with normal direction of blood flow towards the liver. Other: None. IMPRESSION: Normal examination Electronically Signed   By: Prudencio Pair M.D.   On: 08/11/2019 02:30     Scheduled Meds: . Chlorhexidine Gluconate Cloth  6 each Topical Daily  . mouth rinse  15 mL Mouth Rinse BID  . potassium & sodium phosphates  2 packet Oral TID WC & HS  . potassium chloride  20 mEq Per Tube Q4H   Continuous Infusions: .  ceFAZolin (ANCEF) IV    . lactated ringers with KCl/Additives Pediatric custom IV fluid Stopped (08/12/19 0851)  . lactated ringers    . lactated ringers 999 mL/hr at 08/12/19 1200  . vancomycin Stopped (08/12/19 [id number])     LOS: 1 day   Time spent: 30  minutes   Arma Heading, MD [facility] Pager [phone]  If 7PM-7AM, please contact night-coverage [email or web address] Password [email or web address] 08/12/2019, 1:08 PM

## 2019-08-12 NOTE — Consult Note (Signed)
[facility] for Infectious Disease    Date of Admission:  08/10/2019           Day 2 vancomycin        Day 2 cefepime        Day 2 metronidazole       Reason for Consult: Automatic consultation for Staph aureus bacteremia     Assessment: He has staph aureus bacteremia, most likely from the recent injection site on his left forearm.  I suspect that his acute transaminitis and hyperbilirubinemia are secondary to his bacteremia.  He has no evidence of endocarditis by exam or TTE.  We will need to decide if it is worth pursuing a TEE, especially if only one blood culture is positive.  It will be a challenge to treat him as an outpatient given his mental health and substance abuse problems.  Obviously, he has not a candidate for outpatient IV antibiotic therapy except for intermittent long-acting agents like dalbavancin.  I will treat him with vancomycin and cefazolin to provide optimal coverage for MRSA and MSSA while awaiting antibiotic susceptibilities. I will order repeat blood cultures.  HIV and hepatitis C antibodies are negative.  Plan: 1. Continue vancomycin 2. Change cefepime and metronidazole to cefazolin pending final blood cultures 3. Repeat blood cultures  Principal Problem:   Staphylococcus aureus bacteremia Active Problems:   Transaminitis   Hyperbilirubinemia   Schizoaffective disorder, bipolar type (HCC)   Tobacco use   SIRS (systemic inflammatory response syndrome) (HCC)   Normocytic anemia   Thrombocytopenia (HCC)   Polysubstance abuse (HCC)   IVDU (intravenous drug user)   S/P VP shunt   Scheduled Meds: . Chlorhexidine Gluconate Cloth  6 each Topical Daily  . mouth rinse  15 mL Mouth Rinse BID  . potassium & sodium phosphates  2 packet Oral TID WC & HS  . potassium chloride  20 mEq Per Tube Q4H   Continuous Infusions: . ceFEPime (MAXIPIME) IV Stopped (08/12/19 1022)  . lactated ringers with KCl/Additives Pediatric custom IV fluid Stopped  (08/12/19 0851)  . lactated ringers 999 mL/hr at 08/12/19 1200  . metronidazole Stopped (08/12/19 [id number])  . vancomycin Stopped (08/12/19 0713)   PRN Meds:.acetaminophen **OR** acetaminophen, guaiFENesin-dextromethorphan, ibuprofen, lip balm, ondansetron **OR** ondansetron (ZOFRAN) IV  HPI: Justin Chaney is a 24 y.o. male with a history of bipolar disorder, polysubstance abuse and injecting drug use who was admitted on 08/10/2019 with a 1 week history of fever, chills, nausea, vomiting, diarrhea and shortness of breath.  He had a temperature of 103.9 degrees and an elevated lactic acid.  His AST, ALT and bilirubin were acutely elevated.  No acute abnormalities were noted on CT scans of head, chest or abdomen. He was started on broad empiric antibiotic therapy.  1 of 2 admission blood cultures is now growing staph aureus.  He recently developed some sores on his left forearm and seems to indicate that was where he had injected some drugs.  He had a VP shunt placed when he was 81 months old but it appears that it was removed at [facility] and 2019.   Review of Systems: Review of Systems  Constitutional: Positive for chills, fever and malaise/fatigue. Negative for diaphoresis and weight loss.  HENT: Positive for congestion. Negative for sore throat.   Respiratory: Positive for cough and shortness of breath. Negative for sputum production.   Cardiovascular: Positive for chest pain.  Gastrointestinal: Positive for abdominal  pain, diarrhea, nausea and vomiting.  Musculoskeletal: Positive for myalgias and neck pain.  Neurological: Positive for headaches.  Psychiatric/Behavioral: Positive for depression and substance abuse.    Past Medical History:  Diagnosis Date  . Asthma   . Bipolar disorder ([place])   . 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 Depression   . Homelessness   . Schizophrenia ([place])   . Seizures ([place])    childhood seizures  . Suicidal  behavior   . Suicidal intent     Social History   Tobacco Use  . Smoking status: Current Every Day Smoker    Packs/day: 0.00    Types: Cigars  . Smokeless tobacco: Never Used  Substance Use Topics  . Alcohol use: No  . Drug use: No    Types: Other-see comments, Amphetamines    Comment: History of use    Family History  Problem Relation Age of Onset  . Hypertension Mother   . Mental illness Neg Hx    Allergies  Allergen Reactions  . Amoxicillin Hives and Other (See Comments)    CHILDHOOD ALLERGY - Per [facility] records, tolerates Ancef and Rocephin Has patient had a PCN reaction causing immediate rash, facial/tongue/throat swelling, SOB or lightheadedness with hypotension: YES Has patient had a PCN reaction causing severe rash involving mucus membranes or skin necrosis: No Has patient had a PCN reaction that required hospitalization No    OBJECTIVE: Blood pressure (!) 115/39, pulse (!) 106, temperature 99.3 F (37.4 C), temperature source Oral, resp. rate (!) 27, height 5\' 2"  (1.575 m), weight 103.4 kg, SpO2 98 %.  Physical Exam Constitutional:      Comments: He is resting quietly in bed.  HENT:     Mouth/Throat:     Pharynx: No oropharyngeal exudate.  Cardiovascular:     Rate and Rhythm: Regular rhythm. Tachycardia present.     Heart sounds: No murmur.  Pulmonary:     Effort: Pulmonary effort is normal.     Breath sounds: Wheezing present. No rhonchi or rales.  Abdominal:     General: There is no distension.     Palpations: Abdomen is soft. There is no mass.  Musculoskeletal:        General: No swelling or tenderness.  Skin:    Findings: No rash.     Comments: 3 small ulcerated areas in his left antecubital fossa without any drainage or fluctuance.  He has multiple scars from probable track marks.  Neurological:     General: No focal deficit present.     Lab Results Lab Results  Component Value Date   WBC 10.6 (H) 08/12/2019   HGB 10.4 (L) 08/12/2019   HCT  32.4 (L) 08/12/2019   MCV 83.9 08/12/2019   PLT 107 (L) 08/12/2019    Lab Results  Component Value Date   CREATININE 0.67 08/12/2019   BUN 8 08/12/2019   NA 133 (L) 08/12/2019   K 3.0 (L) 08/12/2019   CL 100 08/12/2019   CO2 23 08/12/2019    Lab Results  Component Value Date   ALT 35 08/12/2019   AST 38 08/12/2019   ALKPHOS 71 08/12/2019   BILITOT 5.0 (H) 08/12/2019     Microbiology: Recent Results (from the past 240 hour(s))  Blood culture (routine x 2)     Status: Abnormal (Preliminary result)   Collection Time: 08/11/19 12:03 AM   Specimen: BLOOD  Result Value Ref Range Status   Specimen Description   Final  BLOOD RIGHT ARM Performed at [facility], [place] [address], [place], [place] [address]    Special Requests   Final    BOTTLES DRAWN AEROBIC AND ANAEROBIC Blood Culture adequate volume Performed at [facility] [address] [place], [place] [address]    Culture  Setup Time   Final    GRAM POSITIVE COCCI IN CLUSTERS IN BOTH AEROBIC AND ANAEROBIC BOTTLES CRITICAL RESULT CALLED TO, READ BACK BY AND VERIFIED WITH: M. HICKS PHARMD, AT 1334 08/11/19 BY Rush Landmark Performed at [facility], [place] [address] [place], [place] [address]    Culture STAPHYLOCOCCUS AUREUS (A)  Final   Report Status PENDING  Incomplete  SARS CORONAVIRUS 2 (TAT 6-24 HRS) Nasopharyngeal Nasopharyngeal Swab     Status: None   Collection Time: 08/11/19  4:09 AM   Specimen: Nasopharyngeal Swab  Result Value Ref Range Status   SARS Coronavirus 2 NEGATIVE NEGATIVE Final    Comment: (NOTE) SARS-CoV-2 target nucleic acids are NOT DETECTED. The SARS-CoV-2 RNA is generally detectable in upper and lower respiratory specimens during the acute phase of infection. Negative results do not preclude SARS-CoV-2 infection, do not rule out co-infections with other pathogens, and should not be used as the sole basis for treatment or other patient management  decisions. Negative results must be combined with clinical observations, patient history, and epidemiological information. The expected result is Negative. Fact Sheet for Patients: SugarRoll.be Fact Sheet for Healthcare Providers: https://www.woods-mathews.com/ This test is not yet approved or cleared by the [place] FDA and  has been authorized for detection and/or diagnosis of SARS-CoV-2 by FDA under an Emergency Use Authorization (EUA). This EUA will remain  in effect (meaning this test can be used) for the duration of the COVID-19 declaration under Section 56 4(b)(1) of the Act, 21 U.S.C. section 360bbb-3(b)(1), unless the authorization is terminated or revoked sooner. Performed at [facility], [place] [address], [place], [place] [address]   MRSA PCR Screening     Status: None   Collection Time: 08/11/19 11:00 PM   Specimen: Nasopharyngeal  Result Value Ref Range Status   MRSA by PCR NEGATIVE NEGATIVE Final    Comment:        The GeneXpert MRSA Assay (FDA approved for NASAL specimens only), is one component of a comprehensive MRSA colonization surveillance program. It is not intended to diagnose MRSA infection nor to guide or monitor treatment for MRSA infections. Performed at [facility], [place] [address], [place], [place] [address]     Michel Bickers, [place] [facility] [place] [facility] [phone] pager   [phone] cell 08/12/2019, 12:10 PM

## 2019-08-12 NOTE — Progress Notes (Signed)
  Echocardiogram 2D Echocardiogram has been performed.  Justin Chaney 08/12/2019, 11:05 AM

## 2019-08-13 DIAGNOSIS — A419 Sepsis, unspecified organism: Secondary | ICD-10-CM

## 2019-08-13 DIAGNOSIS — R6520 Severe sepsis without septic shock: Secondary | ICD-10-CM

## 2019-08-13 DIAGNOSIS — F1990 Other psychoactive substance use, unspecified, uncomplicated: Secondary | ICD-10-CM

## 2019-08-13 DIAGNOSIS — F319 Bipolar disorder, unspecified: Secondary | ICD-10-CM

## 2019-08-13 DIAGNOSIS — R911 Solitary pulmonary nodule: Secondary | ICD-10-CM

## 2019-08-13 LAB — COMPREHENSIVE METABOLIC PANEL
ALT: 32 U/L (ref 0–44)
AST: 34 U/L (ref 15–41)
Albumin: 2.5 g/dL — ABNORMAL LOW (ref 3.5–5.0)
Alkaline Phosphatase: 68 U/L (ref 38–126)
Anion gap: 10 (ref 5–15)
BUN: 7 mg/dL (ref 6–20)
CO2: 22 mmol/L (ref 22–32)
Calcium: 7.9 mg/dL — ABNORMAL LOW (ref 8.9–10.3)
Chloride: 106 mmol/L (ref 98–111)
Creatinine, Ser: 0.72 mg/dL (ref 0.61–1.24)
GFR calc Af Amer: >60 mL/min (ref >60)
GFR calc non Af Amer: >60 mL/min (ref >60)
Glucose, Bld: 112 mg/dL — ABNORMAL HIGH (ref 70–99)
Potassium: 3.4 mmol/L — ABNORMAL LOW (ref 3.5–5.1)
Sodium: 138 mmol/L (ref 135–145)
Total Bilirubin: 2.5 mg/dL — ABNORMAL HIGH (ref 0.3–1.2)
Total Protein: 5.7 g/dL — ABNORMAL LOW (ref 6.5–8.1)

## 2019-08-13 LAB — CULTURE, BLOOD (ROUTINE X 2): Special Requests: ADEQUATE

## 2019-08-13 LAB — CBC
HCT: 31.8 % — ABNORMAL LOW (ref 39.0–52.0)
Hemoglobin: 10.2 g/dL — ABNORMAL LOW (ref 13.0–17.0)
MCH: 27.2 pg (ref 26.0–34.0)
MCHC: 32.1 g/dL (ref 30.0–36.0)
MCV: 84.8 fL (ref 80.0–100.0)
Platelets: 154 10*3/uL (ref 150–400)
RBC: 3.75 MIL/uL — ABNORMAL LOW (ref 4.22–5.81)
RDW: 13.5 % (ref 11.5–15.5)
WBC: 12.3 10*3/uL — ABNORMAL HIGH (ref 4.0–10.5)
nRBC: 0 % (ref 0.0–0.2)

## 2019-08-13 LAB — PHOSPHORUS: Phosphorus: 2.9 mg/dL (ref 2.5–4.6)

## 2019-08-13 LAB — MAGNESIUM: Magnesium: 1.9 mg/dL (ref 1.7–2.4)

## 2019-08-13 LAB — PATHOLOGIST SMEAR REVIEW

## 2019-08-13 MED ORDER — HEPATITIS B VAC RECOMBINANT 10 MCG/ML IJ SUSP
1.0000 mL | Freq: Once | INTRAMUSCULAR | Status: AC
  Administered 2019-08-13: 10 ug via INTRAMUSCULAR
  Filled 2019-08-13 (×2): qty 1

## 2019-08-13 MED ORDER — HEPATITIS A VACCINE 1440 EL U/ML IM SUSP
1.0000 mL | Freq: Once | INTRAMUSCULAR | Status: AC
  Administered 2019-08-13: 1440 [IU] via INTRAMUSCULAR
  Filled 2019-08-13: qty 1

## 2019-08-13 MED ORDER — TRAZODONE HCL 50 MG PO TABS
50.0000 mg | ORAL_TABLET | Freq: Every evening | ORAL | Status: DC | PRN
  Administered 2019-08-13: 50 mg via ORAL
  Filled 2019-08-13: qty 1

## 2019-08-13 NOTE — Progress Notes (Signed)
INFECTIOUS DISEASE PROGRESS NOTE  ID: Justin Chaney is a 24 y.o. male with  Principal Problem:   Staphylococcus aureus bacteremia Active Problems:   Schizoaffective disorder, bipolar type (HCC)   Tobacco use   SIRS (systemic inflammatory response syndrome) (HCC)   Normocytic anemia   Thrombocytopenia (HCC)   Polysubstance abuse (HCC)   IVDU (intravenous drug user)   S/P VP shunt   Transaminitis   Hyperbilirubinemia  Subjective: Pt knows he is in ICU.  Not sure what his baseline is.  He has been off his mental health meds.  Chest pain with deep breathing  Abtx:  Anti-infectives (From admission, onward)   Start     Dose/Rate Route Frequency Ordered Stop   08/12/19 1800  ceFAZolin (ANCEF) IVPB 2g/100 mL premix     2 g 200 mL/hr over 30 Minutes Intravenous Every 8 hours 08/12/19 1251     08/11/19 1400  metroNIDAZOLE (FLAGYL) IVPB 500 mg  Status:  Discontinued     500 mg 100 mL/hr over 60 Minutes Intravenous Every 8 hours 08/11/19 0556 08/12/19 1229   08/11/19 1000  ceFEPIme (MAXIPIME) 2 g in sodium chloride 0.9 % 100 mL IVPB  Status:  Discontinued     2 g 200 mL/hr over 30 Minutes Intravenous Every 8 hours 08/11/19 0330 08/12/19 1229   08/11/19 0600  vancomycin (VANCOREADY) IVPB 1750 mg/350 mL  Status:  Discontinued     1,750 mg 175 mL/hr over 120 Minutes Intravenous Every 12 hours 08/11/19 0330 08/11/19 0335   08/11/19 0600  vancomycin (VANCOREADY) IVPB 1250 mg/250 mL  Status:  Discontinued     1,250 mg 166.7 mL/hr over 90 Minutes Intravenous Every 12 hours 08/11/19 0335 08/13/19 0928   08/11/19 0200  ceFEPIme (MAXIPIME) 2 g in sodium chloride 0.9 % 100 mL IVPB     2 g 200 mL/hr over 30 Minutes Intravenous  Once 08/11/19 0149 08/11/19 0257   08/11/19 0145  metroNIDAZOLE (FLAGYL) IVPB 500 mg     500 mg 100 mL/hr over 60 Minutes Intravenous  Once 08/11/19 0135 08/11/19 0623   08/11/19 0145  vancomycin (VANCOCIN) IVPB 1000 mg/200 mL premix     1,000 mg 200 mL/hr over  60 Minutes Intravenous  Once 08/11/19 0142 08/11/19 0509      Medications:  Scheduled: . Chlorhexidine Gluconate Cloth  6 each Topical Daily  . mouth rinse  15 mL Mouth Rinse BID  . potassium & sodium phosphates  2 packet Oral TID WC & HS    Objective: Vital signs in last 24 hours: Temp:  [99.1 F (37.3 C)-100.7 F (38.2 C)] 99.1 F (37.3 C) (01/11 0800) Pulse Rate:  [100-120] 106 (01/11 0400) Resp:  [24-41] 35 (01/11 0400) BP: (85-157)/(32-58) 113/48 (01/11 0400) SpO2:  [95 %-99 %] 96 % (01/11 0400)   General appearance: alert, cooperative and no distress Resp: rhonchi anterior - right Cardio: regularly irregular rhythm GI: normal findings: bowel sounds normal and soft, non-tender Extremities: edema trace  Lab Results Recent Labs    08/12/19 0120 08/13/19 0208 08/13/19 0519  WBC 10.6*  --  12.3*  HGB 10.4*  --  10.2*  HCT 32.4*  --  31.8*  NA 133* 138  --   K 3.0* 3.4*  --   CL 100 106  --   CO2 23 22  --   BUN 8 7  --   CREATININE 0.67 0.72  --    Liver Panel Recent Labs    08/11/19 0556 08/12/19  0120 08/13/19 0208  PROT 7.7 5.9* 5.7*  ALBUMIN 3.4* 2.7* 2.5*  AST 57* 38 34  ALT 54* 35 32  ALKPHOS 110 71 68  BILITOT 7.5* 5.0* 2.5*  BILIDIR 4.3*  --   --   IBILI 3.2*  --   --    Sedimentation Rate No results for input(s): ESRSEDRATE in the last 72 hours. C-Reactive Protein Recent Labs    08/11/19 0800  CRP 28.5*    Microbiology: Recent Results (from the past 240 hour(s))  Blood culture (routine x 2)     Status: Abnormal   Collection Time: 08/11/19 12:03 AM   Specimen: BLOOD  Result Value Ref Range Status   Specimen Description   Final    BLOOD RIGHT ARM Performed at [facility] [address], [place], [place] [address]    Special Requests   Final    BOTTLES DRAWN AEROBIC AND ANAEROBIC Blood Culture adequate volume Performed at [facility] [address] [place], [place] [address]    Culture   Setup Time   Final    GRAM POSITIVE COCCI IN CLUSTERS IN BOTH AEROBIC AND ANAEROBIC BOTTLES CRITICAL RESULT CALLED TO, READ BACK BY AND VERIFIED WITH: M. HICKS PHARMD, AT 1334 08/11/19 BY Rush Landmark Performed at [facility], [place] [address] [place], [place] [address]    Culture STAPHYLOCOCCUS AUREUS (A)  Final   Report Status 08/13/2019 FINAL  Final   Organism ID, Bacteria STAPHYLOCOCCUS AUREUS  Final      Susceptibility   Staphylococcus aureus - MIC*    CIPROFLOXACIN <=0.5 SENSITIVE Sensitive     ERYTHROMYCIN <=0.25 SENSITIVE Sensitive     GENTAMICIN <=0.5 SENSITIVE Sensitive     OXACILLIN 0.5 SENSITIVE Sensitive     TETRACYCLINE <=1 SENSITIVE Sensitive     VANCOMYCIN <=0.5 SENSITIVE Sensitive     TRIMETH/SULFA <=10 SENSITIVE Sensitive     CLINDAMYCIN <=0.25 SENSITIVE Sensitive     RIFAMPIN <=0.5 SENSITIVE Sensitive     Inducible Clindamycin NEGATIVE Sensitive     * STAPHYLOCOCCUS AUREUS  SARS CORONAVIRUS 2 (TAT 6-24 HRS) Nasopharyngeal Nasopharyngeal Swab     Status: None   Collection Time: 08/11/19  4:09 AM   Specimen: Nasopharyngeal Swab  Result Value Ref Range Status   SARS Coronavirus 2 NEGATIVE NEGATIVE Final    Comment: (NOTE) SARS-CoV-2 target nucleic acids are NOT DETECTED. The SARS-CoV-2 RNA is generally detectable in upper and lower respiratory specimens during the acute phase of infection. Negative results do not preclude SARS-CoV-2 infection, do not rule out co-infections with other pathogens, and should not be used as the sole basis for treatment or other patient management decisions. Negative results must be combined with clinical observations, patient history, and epidemiological information. The expected result is Negative. Fact Sheet for Patients: SugarRoll.be Fact Sheet for Healthcare Providers: https://www.woods-mathews.com/ This test is not yet approved or cleared by the [place] FDA and  has been  authorized for detection and/or diagnosis of SARS-CoV-2 by FDA under an Emergency Use Authorization (EUA). This EUA will remain  in effect (meaning this test can be used) for the duration of the COVID-19 declaration under Section 56 4(b)(1) of the Act, 21 U.S.C. section 360bbb-3(b)(1), unless the authorization is terminated or revoked sooner. Performed at [facility], [place] [address], [place], [place] [address]   MRSA PCR Screening     Status: None   Collection Time: 08/11/19 11:00 PM   Specimen: Nasopharyngeal  Result Value  Ref Range Status   MRSA by PCR NEGATIVE NEGATIVE Final    Comment:        The GeneXpert MRSA Assay (FDA approved for NASAL specimens only), is one component of a comprehensive MRSA colonization surveillance program. It is not intended to diagnose MRSA infection nor to guide or monitor treatment for MRSA infections. Performed at [facility], [place] [address] [place], [place] [address]   Culture, blood (routine x 2)     Status: None (Preliminary result)   Collection Time: 08/12/19 12:43 PM   Specimen: BLOOD LEFT ARM  Result Value Ref Range Status   Specimen Description   Final    BLOOD LEFT ARM Performed at [facility], [place] [address] [place], [place] [address]    Special Requests   Final    BOTTLES DRAWN AEROBIC AND ANAEROBIC Blood Culture adequate volume Performed at [place] [address], [place], [place] [address]    Culture   Final    NO GROWTH < 24 HOURS Performed at [place] [address] [place], [place] [address]    Report Status PENDING  Incomplete  Culture, blood (routine x 2)     Status: None (Preliminary result)   Collection Time: 08/12/19 12:43 PM   Specimen: BLOOD  Result Value Ref Range Status   Specimen Description   Final    BLOOD BLOOD LEFT HAND Performed at [place] [address], [place], [place] [address]    Special Requests    Final    BOTTLES DRAWN AEROBIC ONLY Blood Culture adequate volume Performed at [place] [facility] [address] [place], [place] [address]    Culture   Final    NO GROWTH < 24 HOURS Performed at [facility] [address], [place], [place] [address]    Report Status PENDING  Incomplete    Studies/Results: ECHOCARDIOGRAM COMPLETE  Result Date: 08/12/2019   ECHOCARDIOGRAM REPORT   Patient Name:   Justin Chaney Date of Exam: 08/12/2019 Medical Rec #:  [id number]        Height:       62.0 in Accession #:    [id number]       Weight:       228.0 lb Date of Birth:  March 17, 1996        BSA:          2.02 m Patient Age:    23 years         BP:           111/43 mmHg Patient Gender: M                HR:           108 bpm. 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                 Signs/Symptoms:Shortness of Breath and Bacteremia; Risk                 Factors:Current Smoker. Hx polysubstance abuse.  Sonographer:    Clayton Lefort RDCS (AE) Referring Phys: [id number] [place]  1. Left ventricular ejection fraction, by visual estimation, is 60 to 65%. The left ventricle has normal function. There is no left ventricular hypertrophy.  2. Global right ventricle has normal  systolic function.The right ventricular size is normal.  3. Left atrial size was normal.  4. Right atrial size was normal.  5. The mitral valve is normal in structure. Trivial mitral valve regurgitation. No evidence of mitral stenosis.  6. The tricuspid valve is normal in structure.  7. The aortic valve is tricuspid. Aortic valve regurgitation is not visualized. No evidence of aortic valve sclerosis or stenosis.  8. The pulmonic valve was not well visualized. Pulmonic valve regurgitation is not visualized.  9. The inferior vena cava is normal in size with greater than 50% respiratory variability, suggesting  right atrial pressure of 3 mmHg. 10. Normal LV function; no obvious vegetations. 11. The left ventricle has no regional wall motion abnormalities. FINDINGS  Left Ventricle: Left ventricular ejection fraction, by visual estimation, is 60 to 65%. The left ventricle has normal function. The left ventricle has no regional wall motion abnormalities. There is no left ventricular hypertrophy. Left ventricular diastolic parameters were normal. Normal left atrial pressure. Right Ventricle: The right ventricular size is normal.Global RV systolic function is has normal systolic function. The tricuspid regurgitant velocity is 2.59 m/s, and with an assumed right atrial pressure of 3 mmHg, the estimated right ventricular systolic pressure is normal at 29.9 mmHg. Left Atrium: Left atrial size was normal in size. Right Atrium: Right atrial size was normal in size Pericardium: There is no evidence of pericardial effusion. Mitral Valve: The mitral valve is normal in structure. Trivial mitral valve regurgitation. No evidence of mitral valve stenosis by observation. MV peak gradient, 5.1 mmHg. Tricuspid Valve: The tricuspid valve is normal in structure. Tricuspid valve regurgitation is mild. Aortic Valve: The aortic valve is tricuspid. Aortic valve regurgitation is not visualized. The aortic valve is structurally normal, with no evidence of sclerosis or stenosis. Aortic valve mean gradient measures 6.0 mmHg. Aortic valve peak gradient measures 13.5 mmHg. Aortic valve area, by VTI measures 2.98 cm. Pulmonic Valve: The pulmonic valve was not well visualized. Pulmonic valve regurgitation is not visualized. Pulmonic regurgitation is not visualized. Aorta: The aortic root is normal in size and structure. Venous: The inferior vena cava is normal in size with greater than 50% respiratory variability, suggesting right atrial pressure of 3 mmHg. IAS/Shunts: No atrial level shunt detected by color flow Doppler. Additional Comments: Normal LV  function; no obvious vegetations.  LEFT VENTRICLE PLAX 2D LVOT diam:     2.20 cm  Diastology LVOT Area:     3.80 cm LV e' lateral:   14.70 cm/s                         LV E/e' lateral: 6.9                         LV e' medial:    16.40 cm/s                         LV E/e' medial:  6.2  RIGHT VENTRICLE             IVC RV Basal diam:  2.90 cm     IVC diam: 1.30 cm RV S prime:     13.90 cm/s TAPSE (M-mode): 1.9 cm LEFT ATRIUM             Index       RIGHT ATRIUM           Index LA Vol (A2C):  50.0 ml 24.74 ml/m RA Area:     13.90 cm LA Vol (A4C):   30.8 ml 15.24 ml/m RA Volume:   32.50 ml  16.08 ml/m LA Biplane Vol: 41.7 ml 20.63 ml/m  AORTIC VALVE AV Area (Vmax):    2.95 cm AV Area (Vmean):   3.46 cm AV Area (VTI):     2.98 cm AV Vmax:           184.00 cm/s AV Vmean:          111.000 cm/s AV VTI:            0.242 m AV Peak Grad:      13.5 mmHg AV Mean Grad:      6.0 mmHg LVOT Vmax:         143.00 cm/s LVOT Vmean:        101.000 cm/s LVOT VTI:          0.190 m LVOT/AV VTI ratio: 0.79  AORTA Ao Asc diam: 2.80 cm MITRAL VALVE                         TRICUSPID VALVE MV Area (PHT): 6.54 cm              TR Peak grad:   26.9 mmHg MV Peak grad:  5.1 mmHg              TR Vmax:        277.00 cm/s MV Mean grad:  2.0 mmHg MV Vmax:       1.13 m/s              SHUNTS MV Vmean:      66.0 cm/s             Systemic VTI:  0.19 m MV VTI:        0.19 m                Systemic Diam: 2.20 cm MV PHT:        33.64 msec MV Decel Time: 116 msec MV E velocity: 101.00 cm/s 103 cm/s MV A velocity: 55.20 cm/s  70.3 cm/s MV E/A ratio:  1.83        1.5  Kirk Ruths MD Electronically signed by Kirk Ruths MD Signature Date/Time: 08/12/2019/12:06:58 PM    Final      Assessment/Plan: MSSA bacteremia IVDA  Bipolar Chest nodule  Total days of antibiotics: 3 vanco/cefepime/flagyl --> ancef  Needs TEE Hepatitis and HIV are (-) Will start A/B vaccines Flu vax Repeat BCx (P) 1-10 Appreciate pharm assistance to narrow  anbx Restart home psych meds.  CT chest with single nodule         Bobby Rumpf MD, FACP Infectious Diseases (pager) [phone] [email or web address] 08/13/2019, 9:53 AM  LOS: 2 days

## 2019-08-13 NOTE — Progress Notes (Signed)
   08/13/19 1850  MEWS Score  Resp (!) 22  Pulse Rate (!) 104  BP 118/72  Temp 99.3 F (37.4 C)  SpO2 99 %  O2 Device Room Air  MEWS Score  MEWS RR 1  MEWS Pulse 1  MEWS Systolic 0  MEWS LOC 0  MEWS Temp 0  MEWS Score 2  MEWS Score Color Yellow  MEWS Assessment  Is this an acute change? Yes  MEWS guidelines implemented *See [place]  Provider Notification  Provider Name/Title Kamineni  Date Provider Notified 08/13/19  Time Provider Notified 1857  Notification Reason Change in status  Response No new orders   Patient just transferred from ICU with vital signs that have not changed.  Patient consistently tachycardic, with borderline elevated respiratory rate.  However, patient started in the mews per protocol.

## 2019-08-13 NOTE — Progress Notes (Signed)
Pt for TEE 1130 08/14/19 will obtain consent 08/14/19

## 2019-08-13 NOTE — Progress Notes (Addendum)
PROGRESS NOTE    Justin Chaney  [id number]  DOB: [birth year]  PCP: Patient, No Pcp Per Admit date:08/10/2019  24 y.o.malewithhistory of bipolar disorder, VP shunt, schizophrenia and IVDA who has not been taking his psych meds for months, presented with fever/chills,myalgias associated with frontal headache,N/V/D and cough/ shortness of breath X1 week.  ED Course: Febrile with Tmax 103 F, tachycardic to 130s. Blood pressure was in the low normal improved with fluid bolus. Labs reveal potassium 2.8, Na 133, Cr 0.78, lactic acid 3.5-> improved with fluids CBC was remarkable for low platelets of 42 with hemoglobin of 11.7. CT abdomen pelvis and CT chest were unremarkable.Covid test negative.  Hospital course: Patient was empirically started on antibiotics for possible loading sepsis of unknown source. Since patient's LFTs were mildly elevated at around 56 and 52 sonogram of the abdomen was done which was unremarkable.Blood cultures grew staph aureus,seen by ID ->now on vancomycin/cefazolin- will likely need repeat BCX after 48 hours  Subjective:  Sleeping comfortably. States feels like his breath is shallow but denies any chest pain/dyspnea per se. Tachycardic on monitor.   Objective: Vitals:   08/13/19 0200 08/13/19 0332 08/13/19 0400 08/13/19 0800  BP: (!) 135/58  (!) 113/48 (!) 110/40  Pulse: 100  (!) 106 100  Resp: (!) 28  (!) 35 (!) 26  Temp:  99.6 F (37.6 C)  99.1 F (37.3 C)  TempSrc:  Oral  Oral  SpO2: 95%  96% 99%  Weight:      Height:        Intake/Output Summary (Last 24 hours) at 08/13/2019 1152 Last data filed at 08/13/2019 1028 Gross per 24 hour  Intake 4623.9 ml  Output 1665 ml  Net 2958.9 ml   Filed Weights   08/12/19 0000  Weight: 103.4 kg    Physical Examination:  General exam: Appears calm and comfortable  Respiratory system: Clear to auscultation. Respiratory effort normal. Cardiovascular system: S1 & S2 heard, RRR. No JVD, murmurs, rubs,  gallops or clicks. No pedal edema. Gastrointestinal system: Abdomen is nondistended, soft and nontender. Normal bowel sounds heard. Central nervous system: Alert and oriented. No new focal neurological deficits. Extremities: No contractures, edema or joint deformities.  Skin: No rashes, lesions or ulcers Psychiatry: Judgement and insight appear normal. Mood & affect appropriate.   Data Reviewed: I have personally reviewed following labs and imaging studies  CBC: Recent Labs  Lab 08/11/19 0003 08/11/19 0556 08/11/19 0647 08/12/19 0120 08/13/19 0519  WBC 4.3 9.0  --  10.6* 12.3*  NEUTROABS 3.6 7.7  --   --   --   HGB 11.7* 14.5  --  10.4* 10.2*  HCT 35.9* 45.3  --  32.4* 31.8*  MCV 84.3 85.0  --  83.9 84.8  PLT 42* 121* 97* 107* [id number]   Basic Metabolic Panel: Recent Labs  Lab 08/11/19 0003 08/11/19 0556 08/12/19 0120 08/13/19 0208  NA 133* 134* 133* 138  K 2.8* 3.2* 3.0* 3.4*  CL 96* 98 100 106  CO2 25 24 23 22   GLUCOSE 110* 104* 137* 112*  BUN 11 9 8 7   CREATININE 0.78 0.79 0.67 0.72  CALCIUM 8.5* 8.4* 7.9* 7.9*  MG  --  2.0 2.2 1.9  PHOS  --   --  1.1* 2.9   GFR: Estimated Creatinine Clearance: 147.9 mL/min (by C-G formula based on SCr of 0.72 mg/dL). Liver Function Tests: Recent Labs  Lab 08/11/19 0003 08/11/19 0556 08/12/19 0120 08/13/19 0208  AST 56* 57* 38 34  ALT 52* 54* 35 32  ALKPHOS 111 110 71 68  BILITOT 7.6* 7.5* 5.0* 2.5*  PROT 7.3 7.7 5.9* 5.7*  ALBUMIN 3.4* 3.4* 2.7* 2.5*   Recent Labs  Lab 08/11/19 0003  LIPASE 20   No results for input(s): AMMONIA in the last 168 hours. Coagulation Profile: Recent Labs  Lab 08/11/19 0647  INR 1.2   Cardiac Enzymes: Recent Labs  Lab 08/11/19 0800 08/12/19 0820  CKTOTAL 827* 471*   BNP (last 3 results) No results for input(s): PROBNP in the last 8760 hours. HbA1C: No results for input(s): HGBA1C in the last 72 hours. CBG: No results for input(s): GLUCAP in the last 168 hours. Lipid  Profile: No results for input(s): CHOL, HDL, LDLCALC, TRIG, CHOLHDL, LDLDIRECT in the last 72 hours. Thyroid Function Tests: No results for input(s): TSH, T4TOTAL, FREET4, T3FREE, THYROIDAB in the last 72 hours. Anemia Panel: Recent Labs    08/11/19 0800  FERRITIN 1,042*   Sepsis Labs: Recent Labs  Lab 08/11/19 0409 08/11/19 0647 08/12/19 0932 08/12/19 1233  LATICACIDVEN 2.4* 2.4* 1.8 1.5    Recent Results (from the past 240 hour(s))  Blood culture (routine x 2)     Status: Abnormal   Collection Time: 08/11/19 12:03 AM   Specimen: BLOOD  Result Value Ref Range Status   Specimen Description   Final    BLOOD RIGHT ARM Performed at [facility] [address], [place], [place] [address]    Special Requests   Final    BOTTLES DRAWN AEROBIC AND ANAEROBIC Blood Culture adequate volume Performed at [facility] [address] [place], [place] [address]    Culture  Setup Time   Final    GRAM POSITIVE COCCI IN CLUSTERS IN BOTH AEROBIC AND ANAEROBIC BOTTLES CRITICAL RESULT CALLED TO, READ BACK BY AND VERIFIED WITH: M. HICKS PHARMD, AT 1334 08/11/19 BY Rush Landmark Performed at [facility], [place] [address] [place], [place] [address]    Culture STAPHYLOCOCCUS AUREUS (A)  Final   Report Status 08/13/2019 FINAL  Final   Organism ID, Bacteria STAPHYLOCOCCUS AUREUS  Final      Susceptibility   Staphylococcus aureus - MIC*    CIPROFLOXACIN <=0.5 SENSITIVE Sensitive     ERYTHROMYCIN <=0.25 SENSITIVE Sensitive     GENTAMICIN <=0.5 SENSITIVE Sensitive     OXACILLIN 0.5 SENSITIVE Sensitive     TETRACYCLINE <=1 SENSITIVE Sensitive     VANCOMYCIN <=0.5 SENSITIVE Sensitive     TRIMETH/SULFA <=10 SENSITIVE Sensitive     CLINDAMYCIN <=0.25 SENSITIVE Sensitive     RIFAMPIN <=0.5 SENSITIVE Sensitive     Inducible Clindamycin NEGATIVE Sensitive     * STAPHYLOCOCCUS AUREUS  SARS CORONAVIRUS 2 (TAT 6-24 HRS) Nasopharyngeal Nasopharyngeal Swab      Status: None   Collection Time: 08/11/19  4:09 AM   Specimen: Nasopharyngeal Swab  Result Value Ref Range Status   SARS Coronavirus 2 NEGATIVE NEGATIVE Final    Comment: (NOTE) SARS-CoV-2 target nucleic acids are NOT DETECTED. The SARS-CoV-2 RNA is generally detectable in upper and lower respiratory specimens during the acute phase of infection. Negative results do not preclude SARS-CoV-2 infection, do not rule out co-infections with other pathogens, and should not be used as the sole basis for treatment or other patient management decisions. Negative results must be combined with clinical observations, patient history, and epidemiological information. The expected result is Negative. Fact Sheet for Patients: SugarRoll.be Fact Sheet for Healthcare Providers: https://www.woods-mathews.com/ This test  is not yet approved or cleared by the [place] and  has been authorized for detection and/or diagnosis of SARS-CoV-2 by FDA under an Emergency Use Authorization (EUA). This EUA will remain  in effect (meaning this test can be used) for the duration of the COVID-19 declaration under Section 56 4(b)(1) of the Act, 21 U.S.C. section 360bbb-3(b)(1), unless the authorization is terminated or revoked sooner. Performed at [facility], [place] [address] [place], [place] [address]   MRSA PCR Screening     Status: None   Collection Time: 08/11/19 11:00 PM   Specimen: Nasopharyngeal  Result Value Ref Range Status   MRSA by PCR NEGATIVE NEGATIVE Final    Comment:        The GeneXpert MRSA Assay (FDA approved for NASAL specimens only), is one component of a comprehensive MRSA colonization surveillance program. It is not intended to diagnose MRSA infection nor to guide or monitor treatment for MRSA infections. Performed at [facility], [place] [address], [place], [place] [address]   Culture, blood (routine x 2)      Status: None (Preliminary result)   Collection Time: 08/12/19 12:43 PM   Specimen: BLOOD LEFT ARM  Result Value Ref Range Status   Specimen Description   Final    BLOOD LEFT ARM Performed at [facility], [place] [address], [place], [place] [address]    Special Requests   Final    BOTTLES DRAWN AEROBIC AND ANAEROBIC Blood Culture adequate volume Performed at [facility] [address] [place], [place] [address]    Culture   Final    NO GROWTH < 24 HOURS Performed at [facility] [address], [place], [place] [address]    Report Status PENDING  Incomplete  Culture, blood (routine x 2)     Status: None (Preliminary result)   Collection Time: 08/12/19 12:43 PM   Specimen: BLOOD  Result Value Ref Range Status   Specimen Description   Final    BLOOD BLOOD LEFT HAND Performed at [facility] [address], [place], [place] [address]    Special Requests   Final    BOTTLES DRAWN AEROBIC ONLY Blood Culture adequate volume Performed at [facility] [address] [place], [place] [address]    Culture   Final    NO GROWTH < 24 HOURS Performed at [facility] [address], [place],  [address]    Report Status PENDING  Incomplete      Radiology Studies: ECHOCARDIOGRAM COMPLETE  Result Date: 08/12/2019   ECHOCARDIOGRAM REPORT   Patient Name:   GAURAV ROHLMAN Date of Exam: 08/12/2019 Medical Rec #:  [id number]        Height:       62.0 in Accession #:    [id number]       Weight:       228.0 lb Date of Birth:  [birth year]        BSA:          2.02 m Patient Age:    23 years         BP:           111/43 mmHg Patient Gender: M                HR:           108 bpm. Exam Location:  Inpatient Procedure: 2D Echo Indications:  Endocarditis I38  History:        Patient has no prior history of Echocardiogram examinations.                 Signs/Symptoms:Shortness of Breath and Bacteremia; Risk                  Factors:Current Smoker. Hx polysubstance abuse.  Sonographer:    Clayton Lefort RDCS (AE) Referring Phys: [id number] [place]  1. Left ventricular ejection fraction, by visual estimation, is 60 to 65%. The left ventricle has normal function. There is no left ventricular hypertrophy.  2. Global right ventricle has normal systolic function.The right ventricular size is normal.  3. Left atrial size was normal.  4. Right atrial size was normal.  5. The mitral valve is normal in structure. Trivial mitral valve regurgitation. No evidence of mitral stenosis.  6. The tricuspid valve is normal in structure.  7. The aortic valve is tricuspid. Aortic valve regurgitation is not visualized. No evidence of aortic valve sclerosis or stenosis.  8. The pulmonic valve was not well visualized. Pulmonic valve regurgitation is not visualized.  9. The inferior vena cava is normal in size with greater than 50% respiratory variability, suggesting right atrial pressure of 3 mmHg. 10. Normal LV function; no obvious vegetations. 11. The left ventricle has no regional wall motion abnormalities. FINDINGS  Left Ventricle: Left ventricular ejection fraction, by visual estimation, is 60 to 65%. The left ventricle has normal function. The left ventricle has no regional wall motion abnormalities. There is no left ventricular hypertrophy. Left ventricular diastolic parameters were normal. Normal left atrial pressure. Right Ventricle: The right ventricular size is normal.Global RV systolic function is has normal systolic function. The tricuspid regurgitant velocity is 2.59 m/s, and with an assumed right atrial pressure of 3 mmHg, the estimated right ventricular systolic pressure is normal at 29.9 mmHg. Left Atrium: Left atrial size was normal in size. Right Atrium: Right atrial size was normal in size Pericardium: There is no evidence of pericardial effusion. Mitral Valve: The mitral valve is normal in structure. Trivial mitral valve  regurgitation. No evidence of mitral valve stenosis by observation. MV peak gradient, 5.1 mmHg. Tricuspid Valve: The tricuspid valve is normal in structure. Tricuspid valve regurgitation is mild. Aortic Valve: The aortic valve is tricuspid. Aortic valve regurgitation is not visualized. The aortic valve is structurally normal, with no evidence of sclerosis or stenosis. Aortic valve mean gradient measures 6.0 mmHg. Aortic valve peak gradient measures 13.5 mmHg. Aortic valve area, by VTI measures 2.98 cm. Pulmonic Valve: The pulmonic valve was not well visualized. Pulmonic valve regurgitation is not visualized. Pulmonic regurgitation is not visualized. Aorta: The aortic root is normal in size and structure. Venous: The inferior vena cava is normal in size with greater than 50% respiratory variability, suggesting right atrial pressure of 3 mmHg. IAS/Shunts: No atrial level shunt detected by color flow Doppler. Additional Comments: Normal LV function; no obvious vegetations.  LEFT VENTRICLE PLAX 2D LVOT diam:     2.20 cm  Diastology LVOT Area:     3.80 cm LV e' lateral:   14.70 cm/s                         LV E/e' lateral: 6.9                         LV e' medial:    16.40 cm/s  LV E/e' medial:  6.2  RIGHT VENTRICLE             IVC RV Basal diam:  2.90 cm     IVC diam: 1.30 cm RV S prime:     13.90 cm/s TAPSE (M-mode): 1.9 cm LEFT ATRIUM             Index       RIGHT ATRIUM           Index LA Vol (A2C):   50.0 ml 24.74 ml/m RA Area:     13.90 cm LA Vol (A4C):   30.8 ml 15.24 ml/m RA Volume:   32.50 ml  16.08 ml/m LA Biplane Vol: 41.7 ml 20.63 ml/m  AORTIC VALVE AV Area (Vmax):    2.95 cm AV Area (Vmean):   3.46 cm AV Area (VTI):     2.98 cm AV Vmax:           184.00 cm/s AV Vmean:          111.000 cm/s AV VTI:            0.242 m AV Peak Grad:      13.5 mmHg AV Mean Grad:      6.0 mmHg LVOT Vmax:         143.00 cm/s LVOT Vmean:        101.000 cm/s LVOT VTI:          0.190 m LVOT/AV VTI  ratio: 0.79  AORTA Ao Asc diam: 2.80 cm MITRAL VALVE                         TRICUSPID VALVE MV Area (PHT): 6.54 cm              TR Peak grad:   26.9 mmHg MV Peak grad:  5.1 mmHg              TR Vmax:        277.00 cm/s MV Mean grad:  2.0 mmHg MV Vmax:       1.13 m/s              SHUNTS MV Vmean:      66.0 cm/s             Systemic VTI:  0.19 m MV VTI:        0.19 m                Systemic Diam: 2.20 cm MV PHT:        33.64 msec MV Decel Time: 116 msec MV E velocity: 101.00 cm/s 103 cm/s MV A velocity: 55.20 cm/s  70.3 cm/s MV E/A ratio:  1.83        1.5  Kirk Ruths MD Electronically signed by Kirk Ruths MD Signature Date/Time: 08/12/2019/12:06:58 PM    Final         Scheduled Meds: . Chlorhexidine Gluconate Cloth  6 each Topical Daily  . hepatitis A virus (PF) vaccine  1 mL Intramuscular Once  . hepatitis b vaccine for adults  1 mL Intramuscular Once  . mouth rinse  15 mL Mouth Rinse BID  . potassium & sodium phosphates  2 packet Oral TID WC & HS   Continuous Infusions: .  ceFAZolin (ANCEF) IV Stopped (08/13/19 1028)    Assessment & Plan:   1. Staph aureus bacteremia in IV drug abuser with Sepsis POA--Seen by ID. Sensitivities back--appears to be MSSA, remains on cefazolin.  Off Vancomycin. Repeat blood cx sent 1/10. Will likely need TEE.   2. Transient Anemia, thrombocytopenia: likely related to sepsis, improved spontaneously. No evidence of schistocytes on smear  3. Mildly elevated LFTs POA- likely related to sepsis or cholestasis. Sonogram of the abdomen was unremarkable. HIV/ hepatitis panel unremarkable.  4. History of drug abuse- patient claims he has not had any IV drugs more than 1 month. No signs of withdrawal. Close monitoring for now  5. Schizophrenia-patient states he has not taken any of his medications for many months. Lithium levels on 12/20 and 1/09 <0.06. Resume Trazodone at bedtime. Check EKG for qtc before resuming Haldol. Will need psych f/u before  resuming lithium  DVT prophylaxis:SCDs due to thrombocytopenia.Will start lovenox as not ambulating much Code Status: Full code Family / Patient Communication: d/w patient Disposition Plan: Will need to finish IV abx course, await ID final recommendations Downgrade to telemetry     LOS: 2 days    Time spent: 25 minutes    Gu[place]ford Shi, MD [facility] Pager [phone]  If 7PM-7AM, please contact night-coverage [email or web address] Password [facility] 08/13/2019, 11:52 AM

## 2019-08-14 ENCOUNTER — Inpatient Hospital Stay (HOSPITAL_COMMUNITY)
Admit: 2019-08-14 | Discharge: 2019-08-14 | Disposition: A | Discharge status: Home / Self Care | Payer: Medicaid Other | Attending: Physician Assistant | Admitting: Physician Assistant

## 2019-08-14 ENCOUNTER — Inpatient Hospital Stay (HOSPITAL_COMMUNITY): Payer: Medicaid Other | Admitting: Certified Registered Nurse Anesthetist

## 2019-08-14 ENCOUNTER — Encounter (HOSPITAL_COMMUNITY)
Admission: EM | Discharge status: Against Medical Advice | Payer: Self-pay | Source: Home / Self Care | Attending: Internal Medicine

## 2019-08-14 ENCOUNTER — Encounter (HOSPITAL_COMMUNITY): Payer: Self-pay | Admitting: Emergency Medicine

## 2019-08-14 DIAGNOSIS — I071 Rheumatic tricuspid insufficiency: Secondary | ICD-10-CM

## 2019-08-14 DIAGNOSIS — I38 Endocarditis, valve unspecified: Secondary | ICD-10-CM

## 2019-08-14 DIAGNOSIS — I330 Acute and subacute infective endocarditis: Secondary | ICD-10-CM

## 2019-08-14 HISTORY — PX: TEE WITHOUT CARDIOVERSION: SHX5443

## 2019-08-14 LAB — COMPREHENSIVE METABOLIC PANEL
ALT: 31 U/L (ref 0–44)
AST: 35 U/L (ref 15–41)
Albumin: 2.6 g/dL — ABNORMAL LOW (ref 3.5–5.0)
Alkaline Phosphatase: 74 U/L (ref 38–126)
Anion gap: 9 (ref 5–15)
BUN: 6 mg/dL (ref 6–20)
CO2: 24 mmol/L (ref 22–32)
Calcium: 8.3 mg/dL — ABNORMAL LOW (ref 8.9–10.3)
Chloride: 106 mmol/L (ref 98–111)
Creatinine, Ser: 0.63 mg/dL (ref 0.61–1.24)
GFR calc Af Amer: >60 mL/min (ref >60)
GFR calc non Af Amer: >60 mL/min (ref >60)
Glucose, Bld: 116 mg/dL — ABNORMAL HIGH (ref 70–99)
Potassium: 3.5 mmol/L (ref 3.5–5.1)
Sodium: 139 mmol/L (ref 135–145)
Total Bilirubin: 1.7 mg/dL — ABNORMAL HIGH (ref 0.3–1.2)
Total Protein: 6.6 g/dL (ref 6.5–8.1)

## 2019-08-14 LAB — MAGNESIUM: Magnesium: 2.1 mg/dL (ref 1.7–2.4)

## 2019-08-14 LAB — CBC
HCT: 32.3 % — ABNORMAL LOW (ref 39.0–52.0)
Hemoglobin: 10.3 g/dL — ABNORMAL LOW (ref 13.0–17.0)
MCH: 27.4 pg (ref 26.0–34.0)
MCHC: 31.9 g/dL (ref 30.0–36.0)
MCV: 85.9 fL (ref 80.0–100.0)
Platelets: 217 10*3/uL (ref 150–400)
RBC: 3.76 MIL/uL — ABNORMAL LOW (ref 4.22–5.81)
RDW: 13.9 % (ref 11.5–15.5)
WBC: 12 10*3/uL — ABNORMAL HIGH (ref 4.0–10.5)
nRBC: 0 % (ref 0.0–0.2)

## 2019-08-14 LAB — PHOSPHORUS: Phosphorus: 3.5 mg/dL (ref 2.5–4.6)

## 2019-08-14 SURGERY — ECHOCARDIOGRAM, TRANSESOPHAGEAL
Anesthesia: Monitor Anesthesia Care

## 2019-08-14 MED ORDER — HALOPERIDOL 2 MG PO TABS
2.0000 mg | ORAL_TABLET | Freq: Every day | ORAL | Status: DC
  Administered 2019-08-14 – 2019-09-04 (×13): 2 mg via ORAL
  Filled 2019-08-14 (×27): qty 1

## 2019-08-14 MED ORDER — LACTATED RINGERS IV SOLN: INTRAVENOUS | Status: DC

## 2019-08-14 MED ORDER — ENOXAPARIN SODIUM 40 MG/0.4ML ~~LOC~~ SOLN
40.0000 mg | SUBCUTANEOUS | Status: DC
  Administered 2019-08-15 – 2019-08-27 (×4): 40 mg via SUBCUTANEOUS
  Filled 2019-08-14 (×7): qty 0.4

## 2019-08-14 MED ORDER — LIDOCAINE 2% (20 MG/ML) 5 ML SYRINGE
INTRAMUSCULAR | Status: DC | PRN
  Administered 2019-08-14: 100 mg via INTRAVENOUS

## 2019-08-14 MED ORDER — SODIUM CHLORIDE 0.9 % IV SOLN
INTRAVENOUS | Status: DC | PRN
  Administered 2019-08-31: 250 mL via INTRAVENOUS

## 2019-08-14 MED ORDER — PHENOL 1.4 % MT LIQD
1.0000 | OROMUCOSAL | Status: DC | PRN
  Administered 2019-08-14 – 2019-08-15 (×2): 1 via OROMUCOSAL
  Filled 2019-08-14: qty 177

## 2019-08-14 MED ORDER — PROPOFOL 500 MG/50ML IV EMUL
INTRAVENOUS | Status: DC | PRN
  Administered 2019-08-14: 100 ug/kg/min via INTRAVENOUS

## 2019-08-14 MED ORDER — SODIUM CHLORIDE 0.9 % IV SOLN: INTRAVENOUS | Status: DC

## 2019-08-14 NOTE — Anesthesia Procedure Notes (Signed)
Procedure Name: MAC Date/Time: 08/14/2019 11:50 AM Performed by: Harden Mo, CRNA Pre-anesthesia Checklist: Patient identified, Emergency Drugs available, Suction available and Patient being monitored Patient Re-evaluated:Patient Re-evaluated prior to induction Oxygen Delivery Method: Nasal cannula Preoxygenation: Pre-oxygenation with 100% oxygen Induction Type: IV induction Placement Confirmation: positive ETCO2 and breath sounds checked- equal and bilateral Dental Injury: Teeth and Oropharynx as per pre-operative assessment

## 2019-08-14 NOTE — H&P (Signed)
   INTERVAL PROCEDURE H&P  History and Physical Interval Note:  08/14/2019 10:33 AM  Justin Chaney has presented today for their planned procedure. The various methods of treatment have been discussed with the patient and family. After consideration of risks, benefits and other options for treatment, the patient has consented to the procedure.  The patients' outpatient history has been reviewed, patient examined, and no change in status from most recent office note within the past 30 days. I have reviewed the patients' chart and labs and will proceed as planned. Questions were answered to the patient's satisfaction.   Pixie Casino, MD, [facility], [place] Director of the [facility]  Cardiovascular [facility] Diplomate of the American Board of Clinical Lipidology Attending Cardiologist  Direct Dial: [phone]  Fax: [phone]  Website:  [email or web address] 08/14/2019, 10:33 AM

## 2019-08-14 NOTE — Anesthesia Postprocedure Evaluation (Signed)
Anesthesia Post Note  Patient: Justin Chaney  Procedure(s) Performed: TRANSESOPHAGEAL ECHOCARDIOGRAM (TEE) (N/A )     Patient location during evaluation: Endoscopy Anesthesia Type: MAC Level of consciousness: awake and alert, patient cooperative and oriented Pain management: pain level controlled Vital Signs Assessment: post-procedure vital signs reviewed and stable Respiratory status: spontaneous breathing, nonlabored ventilation and respiratory function stable Cardiovascular status: blood pressure returned to baseline and stable Postop Assessment: no apparent nausea or vomiting Anesthetic complications: no    Last Vitals:  Vitals:   08/14/19 1240 08/14/19 1407  BP: (!) 113/57 117/72  Pulse: 89 72  Resp: (!) 28   Temp:  36.8 C  SpO2: 98% 100%    Last Pain:  Vitals:   08/14/19 1407  TempSrc: Oral  PainSc:                  [name],[name]

## 2019-08-14 NOTE — Progress Notes (Signed)
PROGRESS NOTE    Justin Chaney  [id number]  DOB: 1995/11/26  PCP: Patient, No Pcp Per Admit date:08/10/2019  24 y.o.malewithhistory of bipolar disorder, VP shunt, schizophrenia and IVDA who has not been taking his psych meds for months, presented with fever/chills,myalgias associated with frontal headache,N/V/D and cough/ shortness of breath X1 week.  ED Course: Febrile with Tmax 103 F, tachycardic to 130s. Blood pressure was in the low normal improved with fluid bolus. Labs reveal potassium 2.8, Na 133, Cr 0.78, lactic acid 3.5-> improved with fluids CBC was remarkable for low platelets of 42 with hemoglobin of 11.7. CT abdomen pelvis and CT chest were unremarkable.Covid test negative.  Hospital course: Patient was empirically started on antibiotics for possible loading sepsis of unknown source. Since patient's LFTs were mildly elevated at around 56 and 52 sonogram of the abdomen was done which was unremarkable.Blood cultures grew staph aureus,seen by ID ->now on vancomycin/cefazolin- will likely need repeat BCX after 48 hours  Subjective:  Sleeping comfortably. Underwent TEE today. HR improved  Objective: Vitals:   08/14/19 1230 08/14/19 1240 08/14/19 1407 08/14/19 1544  BP: 103/62 (!) 113/57 117/72 119/81  Pulse: 90 89 72 79  Resp: (!) 31 (!) 28  (!) 26  Temp:   98.2 F (36.8 C) 98.2 F (36.8 C)  TempSrc:   Oral Oral  SpO2: 97% 98% 100% 100%  Weight:      Height:        Intake/Output Summary (Last 24 hours) at 08/14/2019 1848 Last data filed at 08/14/2019 1805 Gross per 24 hour  Intake 2037 ml  Output --  Net 2037 ml   Filed Weights   08/12/19 0000  Weight: 103.4 kg    Physical Examination:  General exam: Appears calm and comfortable  Respiratory system: Clear to auscultation. Respiratory effort normal. Cardiovascular system: S1 & S2 heard, RRR. No JVD, murmurs, rubs, gallops or clicks. No pedal edema. Gastrointestinal system: Abdomen is nondistended,  soft and nontender. Normal bowel sounds heard. Central nervous system: Alert and oriented. No new focal neurological deficits. Extremities: No contractures, edema or joint deformities.  Skin: No rashes, lesions or ulcers Psychiatry: Judgement and insight appear normal. Mood & affect appropriate.   Data Reviewed: I have personally reviewed following labs and imaging studies  CBC: Recent Labs  Lab 08/11/19 0003 08/11/19 0556 08/11/19 0647 08/12/19 0120 08/13/19 0519 08/14/19 0452  WBC 4.3 9.0  --  10.6* 12.3* 12.0*  NEUTROABS 3.6 7.7  --   --   --   --   HGB 11.7* 14.5  --  10.4* 10.2* 10.3*  HCT 35.9* 45.3  --  32.4* 31.8* 32.3*  MCV 84.3 85.0  --  83.9 84.8 85.9  PLT 42* 121* 97* 107* 154 [id number]   Basic Metabolic Panel: Recent Labs  Lab 08/11/19 0003 08/11/19 0556 08/12/19 0120 08/13/19 0208 08/14/19 0452  NA 133* 134* 133* 138 139  K 2.8* 3.2* 3.0* 3.4* 3.5  CL 96* 98 100 106 106  CO2 25 24 23 22 24   GLUCOSE 110* 104* 137* 112* 116*  BUN 11 9 8 7 6   CREATININE 0.78 0.79 0.67 0.72 0.63  CALCIUM 8.5* 8.4* 7.9* 7.9* 8.3*  MG  --  2.0 2.2 1.9 2.1  PHOS  --   --  1.1* 2.9 3.5   GFR: Estimated Creatinine Clearance: 147.9 mL/min (by C-G formula based on SCr of 0.63 mg/dL). Liver Function Tests: Recent Labs  Lab 08/11/19 0003 08/11/19 0556 08/12/19 0120 08/13/19 0208 08/14/19  0452  AST 56* 57* 38 34 35  ALT 52* 54* 35 32 31  ALKPHOS 111 110 71 68 74  BILITOT 7.6* 7.5* 5.0* 2.5* 1.7*  PROT 7.3 7.7 5.9* 5.7* 6.6  ALBUMIN 3.4* 3.4* 2.7* 2.5* 2.6*   Recent Labs  Lab 08/11/19 0003  LIPASE 20   No results for input(s): AMMONIA in the last 168 hours. Coagulation Profile: Recent Labs  Lab 08/11/19 0647  INR 1.2   Cardiac Enzymes: Recent Labs  Lab 08/11/19 0800 08/12/19 0820  CKTOTAL 827* 471*   BNP (last 3 results) No results for input(s): PROBNP in the last 8760 hours. HbA1C: No results for input(s): HGBA1C in the last 72 hours. CBG: No results for  input(s): GLUCAP in the last 168 hours. Lipid Profile: No results for input(s): CHOL, HDL, LDLCALC, TRIG, CHOLHDL, LDLDIRECT in the last 72 hours. Thyroid Function Tests: No results for input(s): TSH, T4TOTAL, FREET4, T3FREE, THYROIDAB in the last 72 hours. Anemia Panel: No results for input(s): VITAMINB12, FOLATE, FERRITIN, TIBC, IRON, RETICCTPCT in the last 72 hours. Sepsis Labs: Recent Labs  Lab 08/11/19 0409 08/11/19 0647 08/12/19 0932 08/12/19 1233  LATICACIDVEN 2.4* 2.4* 1.8 1.5    Recent Results (from the past 240 hour(s))  Blood culture (routine x 2)     Status: Abnormal   Collection Time: 08/11/19 12:03 AM   Specimen: BLOOD  Result Value Ref Range Status   Specimen Description   Final    BLOOD RIGHT ARM Performed at [facility] [address], [place], [place] [address]    Special Requests   Final    BOTTLES DRAWN AEROBIC AND ANAEROBIC Blood Culture adequate volume Performed at [facility] [address], [place], [place] [address]    Culture  Setup Time   Final    GRAM POSITIVE COCCI IN CLUSTERS IN BOTH AEROBIC AND ANAEROBIC BOTTLES CRITICAL RESULT CALLED TO, READ BACK BY AND VERIFIED WITH: M. HICKS PHARMD, AT 1334 08/11/19 BY [place] Performed at [facility], [place] [address] [place], [place] [address]    Culture STAPHYLOCOCCUS AUREUS (A)  Final   Report Status 08/13/2019 FINAL  Final   Organism ID, Bacteria STAPHYLOCOCCUS AUREUS  Final      Susceptibility   Staphylococcus aureus - MIC*    CIPROFLOXACIN <=0.5 SENSITIVE Sensitive     ERYTHROMYCIN <=0.25 SENSITIVE Sensitive     GENTAMICIN <=0.5 SENSITIVE Sensitive     OXACILLIN 0.5 SENSITIVE Sensitive     TETRACYCLINE <=1 SENSITIVE Sensitive     VANCOMYCIN <=0.5 SENSITIVE Sensitive     TRIMETH/SULFA <=10 SENSITIVE Sensitive     CLINDAMYCIN <=0.25 SENSITIVE Sensitive     RIFAMPIN <=0.5 SENSITIVE Sensitive     Inducible Clindamycin NEGATIVE Sensitive     *  STAPHYLOCOCCUS AUREUS  SARS CORONAVIRUS 2 (TAT 6-24 HRS) Nasopharyngeal Nasopharyngeal Swab     Status: None   Collection Time: 08/11/19  4:09 AM   Specimen: Nasopharyngeal Swab  Result Value Ref Range Status   SARS Coronavirus 2 NEGATIVE NEGATIVE Final    Comment: (NOTE) SARS-CoV-2 target nucleic acids are NOT DETECTED. The SARS-CoV-2 RNA is generally detectable in upper and lower respiratory specimens during the acute phase of infection. Negative results do not preclude SARS-CoV-2 infection, do not rule out co-infections with other pathogens, and should not be used as the sole basis for treatment or other patient management decisions. Negative results must be combined with clinical observations, patient history, and epidemiological information. The  expected result is Negative. Fact Sheet for Patients: SugarRoll.be Fact Sheet for Healthcare Providers: https://www.woods-mathews.com/ This test is not yet approved or cleared by the [place] FDA and  has been authorized for detection and/or diagnosis of SARS-CoV-2 by FDA under an Emergency Use Authorization (EUA). This EUA will remain  in effect (meaning this test can be used) for the duration of the COVID-19 declaration under Section 56 4(b)(1) of the Act, 21 U.S.C. section 360bbb-3(b)(1), unless the authorization is terminated or revoked sooner. Performed at [facility], [place] [address], [place], [place] [address]   MRSA PCR Screening     Status: None   Collection Time: 08/11/19 11:00 PM   Specimen: Nasopharyngeal  Result Value Ref Range Status   MRSA by PCR NEGATIVE NEGATIVE Final    Comment:        The GeneXpert MRSA Assay (FDA approved for NASAL specimens only), is one component of a comprehensive MRSA colonization surveillance program. It is not intended to diagnose MRSA infection nor to guide or monitor treatment for MRSA infections. Performed at [facility], [place] [address], [place], [place] [address]   Culture, blood (routine x 2)     Status: None (Preliminary result)   Collection Time: 08/12/19 12:43 PM   Specimen: BLOOD LEFT ARM  Result Value Ref Range Status   Specimen Description   Final    BLOOD LEFT ARM Performed at [facility], [place] [address], [place], [place] [address]    Special Requests   Final    BOTTLES DRAWN AEROBIC AND ANAEROBIC Blood Culture adequate volume Performed at [facility] [address] [place], [place] [address]    Culture   Final    NO GROWTH 2 DAYS Performed at [facility] [address] [place], [place] [address]    Report Status PENDING  Incomplete  Culture, blood (routine x 2)     Status: None (Preliminary result)   Collection Time: 08/12/19 12:43 PM   Specimen: BLOOD  Result Value Ref Range Status   Specimen Description   Final    BLOOD BLOOD LEFT HAND Performed at [facility] [address] [place] [address] [place], [place] [address]    Special Requests   Final    BOTTLES DRAWN AEROBIC ONLY Blood Culture adequate volume Performed at [facility] [address], [place], [place] [address]    Culture   Final    NO GROWTH 2 DAYS Performed at [facility] [address], [place], [place] [address]    Report Status PENDING  Incomplete      Radiology Studies: ECHO TEE  Result Date: 08/14/2019   TRANSESOPHOGEAL ECHO REPORT   Patient Name:   FIELDING LAGRONE Date of Exam: 08/14/2019 Medical Rec #:  [id number]        Height:       62.0 in Accession #:    [id number]       Weight:       228.0 lb Date of Birth:  1995/08/09        BSA:          2.02 m Patient Age:    23 years         BP:           103/45 mmHg Patient Gender: M                HR:  90 bpm. Exam Location:  Inpatient  Procedure: Transesophageal Echo, 3D Echo, Limited Color Doppler and Cardiac            Doppler Indications:     Endocarditis I38  History:          Patient has prior history of Echocardiogram examinations, most                  recent 08/12/2019. Substance abuse.  Sonographer:     Darlina Sicilian RDCS Referring Phys:  [id number] Tami Lin [facility] Diagnosing Phys: Lyman Bishop MD  PROCEDURE: Patients was monitored while under deep sedation. The transesophogeal probe was passed through the esophogus of the patient. Imaged were obtained with the patient in a supine position. Image quality was excellent. The patient developed no complications during the procedure. IMPRESSIONS  1. Left ventricular ejection fraction, by visual estimation, is 60 to 65%. The left ventricle has normal function. There is no left ventricular hypertrophy.  2. The left ventricle has no regional wall motion abnormalities.  3. Global right ventricle has normal systolic function.The right ventricular size is normal. No increase in right ventricular wall thickness.  4. Left atrial size was normal.  5. Right atrial size was normal.  6. The pericardial effusion is circumferential.  7. Trivial pericardial effusion is present.  8. The mitral valve is grossly normal. Trivial mitral valve regurgitation.  9. Tricuspid valve visualized with 2D/3D modes. 10. Moderately sized vegetation on the tricuspid valve. 11. The tricuspid valve is abnormal. 12. The aortic valve is tricuspid. Aortic valve regurgitation is not visualized. 13. The pulmonic valve was grossly normal. Pulmonic valve regurgitation is trivial. 14. Findings consistent with Tricuspid valve endocarditis. FINDINGS  Left Ventricle: Left ventricular ejection fraction, by visual estimation, is 60 to 65%. The left ventricle has normal function. The left ventricle has no regional wall motion abnormalities. There is no left ventricular hypertrophy. Right Ventricle: The right ventricular size is normal. No increase in right ventricular wall thickness. Global RV systolic function is has normal systolic function. Left Atrium: Left atrial size was normal  in size. Right Atrium: Right atrial size was normal in size Pericardium: Trivial pericardial effusion is present. The pericardial effusion is circumferential. Mitral Valve: The mitral valve is grossly normal. Trivial mitral valve regurgitation. Tricuspid Valve: The tricuspid valve is abnormal. Tricuspid valve regurgitation mild-moderate. There is a moderately sized mobile tricuspid valve vegetation on the septal leaflet. The TV vegetation measures 15 mm x 5 mm. The flow in the hepatic veins is normal during ventricular systole. Tricuspid valve visualized with 2D/3D modes. Aortic Valve: The aortic valve is tricuspid. Aortic valve regurgitation is not visualized. Pulmonic Valve: The pulmonic valve was grossly normal. Pulmonic valve regurgitation is trivial. Aorta: The aortic root and ascending aorta are structurally normal, with no evidence of dilitation. Shunts: No atrial level shunt detected by color flow Doppler.  Lyman Bishop MD Electronically signed by Lyman Bishop MD Signature Date/Time: 08/14/2019/1:11:51 PM    Final         Scheduled Meds: . mouth rinse  15 mL Mouth Rinse BID  . potassium & sodium phosphates  2 packet Oral TID WC & HS   Continuous Infusions: . sodium chloride    .  ceFAZolin (ANCEF) IV Stopped (08/14/19 1804)    Assessment & Plan:   1. Staph aureus bacteremia in IV drug abuser with Sepsis POA--Seen by ID. Sensitivities back--appears to be MSSA, remains on cefazolin. Off Vancomycin. Repeat blood cx sent 1/10. TEE today  shows -1.5 x 0.5 cm mobile vegetation of the septal tricuspid leaflet with associated mild to moderate eccentric TR. Will need 6 week of abx therapy. Not a candidate for home IV rx given h/o IVDA.   2. Transient Anemia, thrombocytopenia: likely related to sepsis, improved spontaneously. No evidence of schistocytes on smear  3. Mildly elevated LFTs POA- likely related to sepsis or cholestasis. Sonogram of the abdomen was unremarkable. HIV/ hepatitis panel  unremarkable.  4. History of drug abuse- patient claims he has not had any IV drugs more than 1 month. No signs of withdrawal. Close monitoring for now  5. Schizophrenia-patient states he has not taken any of his medications for many months. Lithium levels on 12/20 and 1/09 <0.06. Resume Trazodone at bedtime. EKG done 1/11 shows sinus tachy with Qtc 453 ms. Resume haldol at lower dose and monitor Qtc periodically.Will need psych consult before resuming lithium- Consult request placed  DVT prophylaxis:SCDs due to thrombocytopenia.Will start lovenox as not ambulating much Code Status: Full code Family / Patient Communication: d/w patient Disposition Plan: Will need to finish IV abx course, await ID final recommendations Downgrade to telemetry     LOS: 3 days    Time spent: 25 minutes    [place] Shi, MD [facility] Pager [phone]  If 7PM-7AM, please contact night-coverage [email or web address] Password [facility] 08/14/2019, 6:48 PM

## 2019-08-14 NOTE — Anesthesia Preprocedure Evaluation (Addendum)
Anesthesia Evaluation  Patient identified by MRN, date of birth, ID band Patient awake    Reviewed: Allergy & Precautions, NPO status , Patient's Chart, lab work & pertinent test results  Airway Mallampati: II  TM Distance: >3 FB Neck ROM: Full    Dental  (+) Dental Advisory Given, Chipped,    Pulmonary asthma , Current Smoker,    breath sounds clear to auscultation       Cardiovascular negative cardio ROS   Rhythm:Regular Rate:Normal     Neuro/Psych Seizures -,  PSYCHIATRIC DISORDERS Depression Bipolar Disorder Schizophrenia    GI/Hepatic negative GI ROS, Neg liver ROS,   Endo/Other  negative endocrine ROS  Renal/GU negative Renal ROS     Musculoskeletal negative musculoskeletal ROS (+)   Abdominal Normal abdominal exam  (+)   Peds  Hematology   Anesthesia Other Findings Pt complains of inability to take deep breath but can't explain why and states it has been present for a few days. No wheezing noted. O2 saturation 100%.   Reproductive/Obstetrics                            Echo:  1. Left ventricular ejection fraction, by visual estimation, is 60 to 65%. The left ventricle has normal function. There is no left ventricular hypertrophy.  2. Global right ventricle has normal systolic function.The right ventricular size is normal.  3. Left atrial size was normal.  4. Right atrial size was normal.  5. The mitral valve is normal in structure. Trivial mitral valve regurgitation. No evidence of mitral stenosis.  6. The tricuspid valve is normal in structure.  7. The aortic valve is tricuspid. Aortic valve regurgitation is not visualized. No evidence of aortic valve sclerosis or stenosis.  8. The pulmonic valve was not well visualized. Pulmonic valve regurgitation is not visualized.  9. The inferior vena cava is normal in size with greater than 50% respiratory variability, suggesting right atrial  pressure of 3 mmHg. 10. Normal LV function; no obvious vegetations. 11. The left ventricle has no regional wall motion abnormalities.  Anesthesia Physical Anesthesia Plan  ASA: III  Anesthesia Plan: MAC   Post-op Pain Management:    Induction: Intravenous  PONV Risk Score and Plan: Propofol infusion  Airway Management Planned: Natural Airway and Nasal Cannula  Additional Equipment: None  Intra-op Plan:   Post-operative Plan:   Informed Consent: I have reviewed the patients History and Physical, chart, labs and discussed the procedure including the risks, benefits and alternatives for the proposed anesthesia with the patient or authorized representative who has indicated his/her understanding and acceptance.       Plan Discussed with: CRNA  Anesthesia Plan Comments:        Anesthesia Quick Evaluation

## 2019-08-14 NOTE — Transfer of Care (Signed)
Immediate Anesthesia Transfer of Care Note  Patient: Justin Chaney  Procedure(s) Performed: TRANSESOPHAGEAL ECHOCARDIOGRAM (TEE) (N/A )  Patient Location: Endoscopy Unit  Anesthesia Type:MAC  Level of Consciousness: drowsy  Airway & Oxygen Therapy: Patient Spontanous Breathing and Patient connected to nasal cannula oxygen  Post-op Assessment: Report given to RN and Post -op Vital signs reviewed and stable  Post vital signs: Reviewed and stable  Last Vitals:  Vitals Value Taken Time  BP 112/38 08/14/19 1216  Temp 36.5 C 08/14/19 1216  Pulse 90 08/14/19 1218  Resp 28 08/14/19 1218  SpO2 100 % 08/14/19 1218  Vitals shown include unvalidated device data.  Last Pain:  Vitals:   08/14/19 1216  TempSrc: Temporal  PainSc: 0-No pain         Complications: No apparent anesthesia complications

## 2019-08-14 NOTE — Progress Notes (Signed)
   Vital Signs MEWS/VS Documentation      08/14/2019 1230 08/14/2019 1240 08/14/2019 1407 08/14/2019 1544   MEWS Score:  3  2  2  2    MEWS Score Color:  Yellow  Yellow  Yellow  Yellow   Resp:  (!) 31  (!) 28  --  (!) 26   Pulse:  90  89  72  79   BP:  103/62  (!) 113/57  117/72  119/81   Temp:  --  --  98.2 F (36.8 C)  98.2 F (36.8 C)   O2 Device:  Room Air  Room Biomedical scientist   Level of Consciousness:  Responds to Voice  Alert  --  --     Pt is on yellow MEWS score due to elevated respiratory rate, will monitor pt closely       [name] 08/14/2019,7:38 PM

## 2019-08-14 NOTE — Progress Notes (Signed)
INFECTIOUS DISEASE PROGRESS NOTE  ID: Justin Chaney is a 24 y.o. male with  Principal Problem:   Staphylococcus aureus bacteremia Active Problems:   Schizoaffective disorder, bipolar type (HCC)   Tobacco use   SIRS (systemic inflammatory response syndrome) (HCC)   Normocytic anemia   Thrombocytopenia (HCC)   Polysubstance abuse (HCC)   IVDU (intravenous drug user)   S/P VP shunt   Transaminitis   Hyperbilirubinemia   Sepsis with acute organ dysfunction without septic shock (HCC)  Subjective: Wants to know when he can go home.   Abtx:  Anti-infectives (From admission, onward)   Start     Dose/Rate Route Frequency Ordered Stop   08/12/19 1800  ceFAZolin (ANCEF) IVPB 2g/100 mL premix     2 g 200 mL/hr over 30 Minutes Intravenous Every 8 hours 08/12/19 1251     08/11/19 1400  metroNIDAZOLE (FLAGYL) IVPB 500 mg  Status:  Discontinued     500 mg 100 mL/hr over 60 Minutes Intravenous Every 8 hours 08/11/19 0556 08/12/19 1229   08/11/19 1000  ceFEPIme (MAXIPIME) 2 g in sodium chloride 0.9 % 100 mL IVPB  Status:  Discontinued     2 g 200 mL/hr over 30 Minutes Intravenous Every 8 hours 08/11/19 0330 08/12/19 1229   08/11/19 0600  vancomycin (VANCOREADY) IVPB 1750 mg/350 mL  Status:  Discontinued     1,750 mg 175 mL/hr over 120 Minutes Intravenous Every 12 hours 08/11/19 0330 08/11/19 0335   08/11/19 0600  vancomycin (VANCOREADY) IVPB 1250 mg/250 mL  Status:  Discontinued     1,250 mg 166.7 mL/hr over 90 Minutes Intravenous Every 12 hours 08/11/19 0335 08/13/19 0928   08/11/19 0200  ceFEPIme (MAXIPIME) 2 g in sodium chloride 0.9 % 100 mL IVPB     2 g 200 mL/hr over 30 Minutes Intravenous  Once 08/11/19 0149 08/11/19 0257   08/11/19 0145  metroNIDAZOLE (FLAGYL) IVPB 500 mg     500 mg 100 mL/hr over 60 Minutes Intravenous  Once 08/11/19 0135 08/11/19 0623   08/11/19 0145  vancomycin (VANCOCIN) IVPB 1000 mg/200 mL premix     1,000 mg 200 mL/hr over 60 Minutes Intravenous  Once  08/11/19 0142 08/11/19 0509      Medications:  Scheduled: . mouth rinse  15 mL Mouth Rinse BID  . potassium & sodium phosphates  2 packet Oral TID WC & HS    Objective: Vital signs in last 24 hours: Temp:  [97.7 F (36.5 C)-99.7 F (37.6 C)] 98.2 F (36.8 C) (01/12 1544) Pulse Rate:  [72-109] 79 (01/12 1544) Resp:  [16-35] 26 (01/12 1544) BP: (97-133)/(38-81) 119/81 (01/12 1544) SpO2:  [97 %-100 %] 100 % (01/12 1544)   General appearance: cooperative and no distress Resp: clear to auscultation bilaterally Cardio: regular rate and rhythm GI: normal findings: bowel sounds normal and soft, non-tender  Lab Results Recent Labs    08/13/19 0208 08/13/19 0519 08/14/19 0452  WBC  --  12.3* 12.0*  HGB  --  10.2* 10.3*  HCT  --  31.8* 32.3*  NA 138  --  139  K 3.4*  --  3.5  CL 106  --  106  CO2 22  --  24  BUN 7  --  6  CREATININE 0.72  --  0.63   Liver Panel Recent Labs    08/13/19 0208 08/14/19 0452  PROT 5.7* 6.6  ALBUMIN 2.5* 2.6*  AST 34 35  ALT 32 31  ALKPHOS 68 74  BILITOT 2.5* 1.7*   Sedimentation Rate No results for input(s): ESRSEDRATE in the last 72 hours. C-Reactive Protein No results for input(s): CRP in the last 72 hours.  Microbiology: Recent Results (from the past 240 hour(s))  Blood culture (routine x 2)     Status: Abnormal   Collection Time: 08/11/19 12:03 AM   Specimen: BLOOD  Result Value Ref Range Status   Specimen Description   Final    BLOOD RIGHT ARM Performed at [facility] [address], [place], [place] [address]    Special Requests   Final    BOTTLES DRAWN AEROBIC AND ANAEROBIC Blood Culture adequate volume Performed at [facility] [address] [place], [place] [address]    Culture  Setup Time   Final    GRAM POSITIVE COCCI IN CLUSTERS IN BOTH AEROBIC AND ANAEROBIC BOTTLES CRITICAL RESULT CALLED TO, READ BACK BY AND VERIFIED WITH: M. HICKS PHARMD, AT 1334 08/11/19 BY Rush Landmark Performed at [facility], [place] [address], [place], [place] [address]    Culture STAPHYLOCOCCUS AUREUS (A)  Final   Report Status 08/13/2019 FINAL  Final   Organism ID, Bacteria STAPHYLOCOCCUS AUREUS  Final      Susceptibility   Staphylococcus aureus - MIC*    CIPROFLOXACIN <=0.5 SENSITIVE Sensitive     ERYTHROMYCIN <=0.25 SENSITIVE Sensitive     GENTAMICIN <=0.5 SENSITIVE Sensitive     OXACILLIN 0.5 SENSITIVE Sensitive     TETRACYCLINE <=1 SENSITIVE Sensitive     VANCOMYCIN <=0.5 SENSITIVE Sensitive     TRIMETH/SULFA <=10 SENSITIVE Sensitive     CLINDAMYCIN <=0.25 SENSITIVE Sensitive     RIFAMPIN <=0.5 SENSITIVE Sensitive     Inducible Clindamycin NEGATIVE Sensitive     * STAPHYLOCOCCUS AUREUS  SARS CORONAVIRUS 2 (TAT 6-24 HRS) Nasopharyngeal Nasopharyngeal Swab     Status: None   Collection Time: 08/11/19  4:09 AM   Specimen: Nasopharyngeal Swab  Result Value Ref Range Status   SARS Coronavirus 2 NEGATIVE NEGATIVE Final    Comment: (NOTE) SARS-CoV-2 target nucleic acids are NOT DETECTED. The SARS-CoV-2 RNA is generally detectable in upper and lower respiratory specimens during the acute phase of infection. Negative results do not preclude SARS-CoV-2 infection, do not rule out co-infections with other pathogens, and should not be used as the sole basis for treatment or other patient management decisions. Negative results must be combined with clinical observations, patient history, and epidemiological information. The expected result is Negative. Fact Sheet for Patients: SugarRoll.be Fact Sheet for Healthcare Providers: https://www.woods-mathews.com/ This test is not yet approved or cleared by the [place] FDA and  has been authorized for detection and/or diagnosis of SARS-CoV-2 by FDA under an Emergency Use Authorization (EUA). This EUA will remain  in effect (meaning this test can be used) for the duration of  the COVID-19 declaration under Section 56 4(b)(1) of the Act, 21 U.S.C. section 360bbb-3(b)(1), unless the authorization is terminated or revoked sooner. Performed at [facility], [place] [address] [place], [place] [address]   MRSA PCR Screening     Status: None   Collection Time: 08/11/19 11:00 PM   Specimen: Nasopharyngeal  Result Value Ref Range Status   MRSA by PCR NEGATIVE NEGATIVE Final    Comment:        The GeneXpert MRSA Assay (FDA approved for NASAL specimens only), is one component of a comprehensive MRSA colonization surveillance program. It is not intended to diagnose MRSA infection  nor to guide or monitor treatment for MRSA infections. Performed at [facility], [place] [address] [place], [place] [address]   Culture, blood (routine x 2)     Status: None (Preliminary result)   Collection Time: 08/12/19 12:43 PM   Specimen: BLOOD LEFT ARM  Result Value Ref Range Status   Specimen Description   Final    BLOOD LEFT ARM Performed at [facility], [place] [address], [place], [place] [address]    Special Requests   Final    BOTTLES DRAWN AEROBIC AND ANAEROBIC Blood Culture adequate volume Performed at [facility] [address] [place], [place] [address]    Culture   Final    NO GROWTH 2 DAYS Performed at [facility] [address], [place], [place] [address]    Report Status PENDING  Incomplete  Culture, blood (routine x 2)     Status: None (Preliminary result)   Collection Time: 08/12/19 12:43 PM   Specimen: BLOOD  Result Value Ref Range Status   Specimen Description   Final    BLOOD BLOOD LEFT HAND Performed at [facility] [address], [place], [place] [address]    Special Requests   Final    BOTTLES DRAWN AEROBIC ONLY Blood Culture adequate volume Performed at [facility] [address] [place], [place] [address]    Culture   Final    NO GROWTH 2  DAYS Performed at [facility] [address] [place], [place] [address]    Report Status PENDING  Incomplete    Studies/Results: ECHO TEE  Result Date: 08/14/2019   TRANSESOPHOGEAL ECHO REPORT   Patient Name:   Justin Chaney Date of Exam: 08/14/2019 Medical Rec #:  [id number]        Height:       62.0 in Accession #:    [id number]       Weight:       228.0 lb Date of Birth:  1995/09/09        BSA:          2.02 m Patient Age:    23 years         BP:           103/45 mmHg Patient Gender: M                HR:           90 bpm. Exam Location:  Inpatient  Procedure: Transesophageal Echo, 3D Echo, Limited Color Doppler and Cardiac            Doppler Indications:     Endocarditis I38  History:         Patient has prior history of Echocardiogram examinations, most                  recent 08/12/2019. Substance abuse.  Sonographer:     Darlina Sicilian RDCS Referring Phys:  [id number] Tami Lin [facility] Diagnosing Phys: Lyman Bishop MD  PROCEDURE: Patients was monitored while under deep sedation. The transesophogeal probe was passed through the esophogus of the patient. Imaged were obtained with the patient in a supine position. Image quality was excellent. The patient developed no complications during the procedure. IMPRESSIONS  1. Left ventricular ejection fraction, by visual estimation, is 60 to 65%. The left ventricle has normal function. There is no left ventricular hypertrophy.  2. The left ventricle has no regional wall motion  abnormalities.  3. Global right ventricle has normal systolic function.The right ventricular size is normal. No increase in right ventricular wall thickness.  4. Left atrial size was normal.  5. Right atrial size was normal.  6. The pericardial effusion is circumferential.  7. Trivial pericardial effusion is present.  8. The mitral valve is grossly normal. Trivial mitral valve regurgitation.  9. Tricuspid valve visualized with 2D/3D modes. 10. Moderately sized vegetation on the  tricuspid valve. 11. The tricuspid valve is abnormal. 12. The aortic valve is tricuspid. Aortic valve regurgitation is not visualized. 13. The pulmonic valve was grossly normal. Pulmonic valve regurgitation is trivial. 14. Findings consistent with Tricuspid valve endocarditis. FINDINGS  Left Ventricle: Left ventricular ejection fraction, by visual estimation, is 60 to 65%. The left ventricle has normal function. The left ventricle has no regional wall motion abnormalities. There is no left ventricular hypertrophy. Right Ventricle: The right ventricular size is normal. No increase in right ventricular wall thickness. Global RV systolic function is has normal systolic function. Left Atrium: Left atrial size was normal in size. Right Atrium: Right atrial size was normal in size Pericardium: Trivial pericardial effusion is present. The pericardial effusion is circumferential. Mitral Valve: The mitral valve is grossly normal. Trivial mitral valve regurgitation. Tricuspid Valve: The tricuspid valve is abnormal. Tricuspid valve regurgitation mild-moderate. There is a moderately sized mobile tricuspid valve vegetation on the septal leaflet. The TV vegetation measures 15 mm x 5 mm. The flow in the hepatic veins is normal during ventricular systole. Tricuspid valve visualized with 2D/3D modes. Aortic Valve: The aortic valve is tricuspid. Aortic valve regurgitation is not visualized. Pulmonic Valve: The pulmonic valve was grossly normal. Pulmonic valve regurgitation is trivial. Aorta: The aortic root and ascending aorta are structurally normal, with no evidence of dilitation. Shunts: No atrial level shunt detected by color flow Doppler.  Lyman Bishop MD Electronically signed by Lyman Bishop MD Signature Date/Time: 08/14/2019/1:11:51 PM    Final      Assessment/Plan: MSSA bacteremia TV IE with mild-mod TR IVDA  Bipolar Chest nodule  Total days of antibiotics: 4 vanco/cefepime/flagyl --> ancef  Repeat BCx 1-10 are  pending.  Would favor 6 weeks of anbx (ancef) for pt.  He will unfortunately need some type of placement for this.  Alternatively, could get him 28 days of rx then a single dose of oritavancin or dalbavancin. This makes his end date 09-08-19.  Please send labs to ID clinic if he is d/c.  Next Hep B dose on 09-13-19, last dose 02-10-20 Next Hep dose 02-10-20 Available as needed.             Bobby Rumpf MD, FACP Infectious Diseases (pager) [phone] [email or web address] 08/14/2019, 6:10 PM  LOS: 3 days

## 2019-08-14 NOTE — Progress Notes (Signed)
PHARMACY CONSULT NOTE FOR:  OUTPATIENT  PARENTERAL ANTIBIOTIC THERAPY (OPAT)  Indication: Endocarditis  Regimen: Cefazolin 2gm IV q8h End date: 09/08/2019  IV antibiotic discharge orders are pended. To discharging provider:  please sign these orders via discharge navigator,  Select New Orders & click on the button choice - Manage This Unsigned Work.     Thank you for allowing pharmacy to be a part of this patient's care.  Everette Rank, PharmD 08/14/2019, 6:29 PM

## 2019-08-14 NOTE — Progress Notes (Signed)
   Vital Signs MEWS/VS Documentation      08/14/2019 1220 08/14/2019 1230 08/14/2019 1240 08/14/2019 1407   MEWS Score:  3  3  2  2    MEWS Score Color:  Yellow  Yellow  Yellow  Yellow   Resp:  (!) 35  (!) 31  (!) 28  --   Pulse:  89  90  89  72   BP:  (!) 103/45  103/62  (!) 113/57  117/72   Temp:  --  --  --  98.2 F (36.8 C)   O2 Device:  Room Air  Room Health visitor  Room Air   Level of Consciousness:  Responds to Voice  Responds to Voice  Alert  --      Patient has been an ongoing yellow MEWS score due to tachypnea. MD is aware. Continuing with current orders for patient.      Nonie Hoyer S 08/14/2019,2:49 PM

## 2019-08-14 NOTE — CV Procedure (Signed)
TRANSESOPHAGEAL ECHOCARDIOGRAM (TEE) NOTE  INDICATIONS: infective endocarditis  PROCEDURE:   Informed consent was obtained prior to the procedure. The risks, benefits and alternatives for the procedure were discussed and the patient comprehended these risks.  Risks include, but are not limited to, cough, sore throat, vomiting, nausea, somnolence, esophageal and stomach trauma or perforation, bleeding, low blood pressure, aspiration, pneumonia, infection, trauma to the teeth and death.    After a procedural time-out, the patient was given propofol per anesthesia for sedation.  The patient's heart rate, blood pressure, and oxygen saturation are monitored continuously during the procedure. The transesophageal probe was inserted in the esophagus and stomach without difficulty and multiple views were obtained.  The patient was kept under observation until the patient left the procedure room.  I was present face-to-face 100% of this time. The patient left the procedure room in stable condition.   Agitated microbubble saline contrast was not administered.  COMPLICATIONS:    There were no immediate complications.  Findings:  1. LEFT VENTRICLE: The left ventricular wall thickness is normal.  The left ventricular cavity is normal in size. Wall motion is normal.  LVEF is 60-65%.  2. RIGHT VENTRICLE:  The right ventricle is normal in structure and function without any thrombus or masses.    3. LEFT ATRIUM:  The left atrium is normal in size without any thrombus or masses.  There is not spontaneous echo contrast ("smoke") in the left atrium consistent with a low flow state.  4. LEFT ATRIAL APPENDAGE:  The left atrial appendage is poorly visualized, however, there is no large thrombus in the ostium.  5. ATRIAL SEPTUM:  The atrial septum appears intact and is free of thrombus and/or masses.  There is no evidence for interatrial shunting by color doppler.  6. RIGHT ATRIUM:  The right atrium is  normal in size and function without any thrombus or masses.  7. MITRAL VALVE:  The mitral valve is normal in structure and function with trivial regurgitation.  There were no vegetations or stenosis.  8. AORTIC VALVE:  The aortic valve is trileaflet, normal in structure and function with no regurgitation.  There were no vegetations or stenosis  9. TRICUSPID VALVE:  The tricuspid valve is notable for vegetation 1.5 x 5 cm mobile vegetation on the septal leaflet with mild to moderate eccentric regurgitation. Findings consistent with endocarditis.  10.  PULMONIC VALVE:  The pulmonic valve is normal in structure and function with trivial regurgitation.  There were no vegetations or stenosis.   11. AORTIC ARCH, ASCENDING AND DESCENDING AORTA:  There was no Ron Parker et. Al, 1992) atherosclerosis of the ascending aorta, aortic arch, or proximal descending aorta.  12. PULMONARY VEINS: Anomalous pulmonary venous return was not noted.  13. PERICARDIUM: The pericardium appeared normal and non-thickened.  There is a trivial circumferential pericardial effusion.  IMPRESSION:   1. 1.5 x 0.5 cm mobile vegetation of the septal tricuspid leaflet with associated mild to moderate eccentric TR. 2. No ostial LAA thrombus 3. Negative for PFO by color doppler 4. Normal biatrial size 5. Trivial circumferential pericardial effusion 6. LVEF 60-65%  RECOMMENDATIONS:    1. Findings suggestive of tricuspid valve endocarditis. Recommend minimum 6 weeks antibiotics per ID recommendations.  Time Spent Directly with the Patient:  45 minutes   Pixie Casino, MD, [facility], [place] Director of the Advanced Lipid Disorders &  Cardiovascular Risk Reduction Clinic Diplomate of the American Board of Clinical  Lipidology Attending Cardiologist  Direct Dial: [phone]  Fax: [phone]  Website:  [email or web address] [email or web address] Ducey 08/14/2019, 12:19 PM

## 2019-08-14 NOTE — Progress Notes (Signed)
  Echocardiogram Echocardiogram Transesophageal has been performed.  Darlina Sicilian M 08/14/2019, 12:28 PM

## 2019-08-15 ENCOUNTER — Encounter: Payer: Self-pay | Admitting: *Deleted

## 2019-08-15 DIAGNOSIS — F1520 Other stimulant dependence, uncomplicated: Secondary | ICD-10-CM

## 2019-08-15 DIAGNOSIS — F1120 Opioid dependence, uncomplicated: Secondary | ICD-10-CM

## 2019-08-15 LAB — CBC
HCT: 33.6 % — ABNORMAL LOW (ref 39.0–52.0)
Hemoglobin: 10.6 g/dL — ABNORMAL LOW (ref 13.0–17.0)
MCH: 27.3 pg (ref 26.0–34.0)
MCHC: 31.5 g/dL (ref 30.0–36.0)
MCV: 86.6 fL (ref 80.0–100.0)
Platelets: 232 10*3/uL (ref 150–400)
RBC: 3.88 MIL/uL — ABNORMAL LOW (ref 4.22–5.81)
RDW: 14 % (ref 11.5–15.5)
WBC: 11.4 10*3/uL — ABNORMAL HIGH (ref 4.0–10.5)
nRBC: 0 % (ref 0.0–0.2)

## 2019-08-15 LAB — COMPREHENSIVE METABOLIC PANEL
ALT: 31 U/L (ref 0–44)
AST: 32 U/L (ref 15–41)
Albumin: 2.8 g/dL — ABNORMAL LOW (ref 3.5–5.0)
Alkaline Phosphatase: 83 U/L (ref 38–126)
Anion gap: 9 (ref 5–15)
BUN: 8 mg/dL (ref 6–20)
CO2: 22 mmol/L (ref 22–32)
Calcium: 8.4 mg/dL — ABNORMAL LOW (ref 8.9–10.3)
Chloride: 104 mmol/L (ref 98–111)
Creatinine, Ser: 0.58 mg/dL — ABNORMAL LOW (ref 0.61–1.24)
GFR calc Af Amer: >60 mL/min (ref >60)
GFR calc non Af Amer: >60 mL/min (ref >60)
Glucose, Bld: 120 mg/dL — ABNORMAL HIGH (ref 70–99)
Potassium: 4 mmol/L (ref 3.5–5.1)
Sodium: 135 mmol/L (ref 135–145)
Total Bilirubin: 1.2 mg/dL (ref 0.3–1.2)
Total Protein: 7.2 g/dL (ref 6.5–8.1)

## 2019-08-15 LAB — MAGNESIUM: Magnesium: 2.3 mg/dL (ref 1.7–2.4)

## 2019-08-15 LAB — PHOSPHORUS: Phosphorus: 3.2 mg/dL (ref 2.5–4.6)

## 2019-08-15 MED ORDER — TRAZODONE HCL 50 MG PO TABS: 150.0000 mg | ORAL_TABLET | Freq: Every day | ORAL | Status: DC

## 2019-08-15 MED ORDER — QUETIAPINE FUMARATE 100 MG PO TABS
300.0000 mg | ORAL_TABLET | Freq: Every day | ORAL | Status: DC
  Administered 2019-08-15 – 2019-09-03 (×4): 300 mg via ORAL
  Filled 2019-08-15 (×15): qty 3

## 2019-08-15 MED ORDER — TRAZODONE HCL 100 MG PO TABS
100.0000 mg | ORAL_TABLET | Freq: Every evening | ORAL | Status: DC | PRN
  Administered 2019-08-20 – 2019-09-06 (×15): 100 mg via ORAL
  Filled 2019-08-15 (×17): qty 1

## 2019-08-15 NOTE — TOC Initial Note (Signed)
Transition of Care [facility]) - Initial/Assessment Note    Patient Details  Name: Justin Chaney MRN: [id number] Date of Birth: [birth year]  [facility]) CM/SW Contact:    [name], LCSW Phone Number: 08/15/2019, 4:57 PM  Clinical Narrative:   Mr Crismon is diagnosed with Staph aureus bacteremia, and Schizoaffective D/O, and requires 6 weeks of IV abx.  He has a history of IV drug use, and so will need to go to to rehab or stay here in the hospital.   Although he has not had a stable living situation for some time now, Mr Suhre states he is invested in finding an apartment, and asked me for resources related to this.  He admits to not taking his mental health medications regularly, and not following up with a psychistrist. Mr Fimbres also makes it clear with me that he does not intend to stay in the hospital for 6 weeks, nor go to rehab for that amount of time.  He is hopeful that his mother will allow him to stay at home with she and his siblings for 6 months during the course of his treatment.  I explained that the Dr would need to approve that, and he has said no to this point.  TOC will reach out to potential resources for rehab. TOC will continue to follow during the course of hospitalization.                Expected Discharge Plan: Skilled Nursing Facility Barriers to Discharge: SNF Pending bed offer   Patient Goals and CMS Choice Patient states their goals for this hospitalization and ongoing recovery are:: "I want to see if I can stay with my mom.  I think she will let me if you tell her it is only for 6 weeks." CMS Medicare.gov Compare Post Acute Care list provided to:: Patient Choice offered to / list presented to : Patient  Expected Discharge Plan and Services Expected Discharge Plan: [place]   Discharge Planning Services: CM Consult   Living arrangements for the past 2 months: No permanent address                                       Prior Living Arrangements/Services Living arrangements for the past 2 months: No permanent address Lives with:: Self Patient language and need for interpreter reviewed:: Yes Do you feel safe going back to the place where you live?: Yes      Need for Family Participation in Patient Care: No (Comment) Care giver support system in place?: Yes (comment)   Criminal Activity/Legal Involvement Pertinent to Current Situation/Hospitalization: No - Comment as needed  Activities of Daily Living Home Assistive Devices/Equipment: None ADL Screening (condition at time of admission) Patient's cognitive ability adequate to safely complete daily activities?: Yes Is the patient deaf or have difficulty hearing?: No Does the patient have difficulty seeing, even when wearing glasses/contacts?: No Does the patient have difficulty concentrating, remembering, or making decisions?: No Patient able to express need for assistance with ADLs?: Yes Does the patient have difficulty dressing or bathing?: No Independently performs ADLs?: Yes (appropriate for developmental age) Does the patient have difficulty walking or climbing stairs?: No Weakness of Legs: None Weakness of Arms/Hands: None  Permission Sought/Granted Permission sought to share information with : Family Supports Permission granted to share information with : Yes, Verbal Permission Granted  Share Information with  NAME: Ms Yokoy[place]     Permission granted to share info w Relationship: mother  Permission granted to share info w Contact Information: [phone]  Emotional Assessment Appearance:: Appears stated age Attitude/Demeanor/Rapport: Engaged Affect (typically observed): Appropriate Orientation: : Oriented to Self, Oriented to Place, Oriented to  Time, Oriented to Situation Alcohol / Substance Use: Illicit Drugs Psych Involvement: No (comment)  Admission diagnosis:  SIRS (systemic inflammatory response syndrome) (HCC)  [R65.10] Elevated transaminase level [R74.01] Sepsis with acute organ dysfunction without septic shock, due to unspecified organism, unspecified type ([place]) [A41.9, R65.20] Patient Active Problem List   Diagnosis Date Noted  . Sepsis with acute organ dysfunction without septic shock ([place])   . Staphylococcus aureus bacteremia 08/12/2019  . Thrombocytopenia ([place]) 08/12/2019  . Polysubstance abuse ([place]) 08/12/2019  . IVDU (intravenous drug user) 08/12/2019  . 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 Transaminitis 08/12/2019  . Hyperbilirubinemia 08/12/2019  . SIRS (systemic inflammatory response syndrome) ([place]) 08/11/2019  . Normocytic anemia 08/11/2019  . Schizoaffective disorder ([place]) 01/10/2019  . Cocaine use disorder, severe, in early remission ([place]) 12/21/2016  . 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 ADHD (attention deficit hyperactivity disorder) 12/21/2016  . 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 Tobacco use disorder 12/21/2016  . Tobacco use 10/06/2016  . Homicidal ideations 09/16/2016  . Suicidal ideation   . Hallucinations   . Homicidal ideation   . Suicidal thoughts   . Cannabis use disorder, moderate, in sustained remission ([place]) 11/07/2015  . Depression 10/29/2015  . Intermittent explosive disorder 09/05/2015  . Schizoaffective disorder, bipolar type ([place]) 09/04/2015  . Malingering 06/05/2015  . Antisocial personality disorder ([place]) 06/03/2015  . Cocaine abuse ([place]) 06/03/2015   PCP:  Patient, No Pcp Per Pharmacy:   [facility] [place] [address] Phone: [phone] Fax: [phone]     Social Determinants of Health (SDOH) Interventions    Readmission Risk Interventions No flowsheet data found.

## 2019-08-15 NOTE — Progress Notes (Addendum)
INFECTIOUS DISEASE PROGRESS NOTE  ID: Justin Chaney is a 24 y.o. male with  Principal Problem:   Staphylococcus aureus bacteremia Active Problems:   Schizoaffective disorder, bipolar type (HCC)   Tobacco use   SIRS (systemic inflammatory response syndrome) (HCC)   Normocytic anemia   Thrombocytopenia (HCC)   Polysubstance abuse (HCC)   IVDU (intravenous drug user)   S/P VP shunt   Transaminitis   Hyperbilirubinemia   Sepsis with acute organ dysfunction without septic shock (HCC)  Subjective: Anxious about d/c plans.   Abtx:  Anti-infectives (From admission, onward)   Start     Dose/Rate Route Frequency Ordered Stop   08/12/19 1800  ceFAZolin (ANCEF) IVPB 2g/100 mL premix     2 g 200 mL/hr over 30 Minutes Intravenous Every 8 hours 08/12/19 1251     08/11/19 1400  metroNIDAZOLE (FLAGYL) IVPB 500 mg  Status:  Discontinued     500 mg 100 mL/hr over 60 Minutes Intravenous Every 8 hours 08/11/19 0556 08/12/19 1229   08/11/19 1000  ceFEPIme (MAXIPIME) 2 g in sodium chloride 0.9 % 100 mL IVPB  Status:  Discontinued     2 g 200 mL/hr over 30 Minutes Intravenous Every 8 hours 08/11/19 0330 08/12/19 1229   08/11/19 0600  vancomycin (VANCOREADY) IVPB 1750 mg/350 mL  Status:  Discontinued     1,750 mg 175 mL/hr over 120 Minutes Intravenous Every 12 hours 08/11/19 0330 08/11/19 0335   08/11/19 0600  vancomycin (VANCOREADY) IVPB 1250 mg/250 mL  Status:  Discontinued     1,250 mg 166.7 mL/hr over 90 Minutes Intravenous Every 12 hours 08/11/19 0335 08/13/19 0928   08/11/19 0200  ceFEPIme (MAXIPIME) 2 g in sodium chloride 0.9 % 100 mL IVPB     2 g 200 mL/hr over 30 Minutes Intravenous  Once 08/11/19 0149 08/11/19 0257   08/11/19 0145  metroNIDAZOLE (FLAGYL) IVPB 500 mg     500 mg 100 mL/hr over 60 Minutes Intravenous  Once 08/11/19 0135 08/11/19 0623   08/11/19 0145  vancomycin (VANCOCIN) IVPB 1000 mg/200 mL premix     1,000 mg 200 mL/hr over 60 Minutes Intravenous  Once 08/11/19  0142 08/11/19 0509      Medications:  Scheduled: . enoxaparin (LOVENOX) injection  40 mg Subcutaneous Q24H  . haloperidol  2 mg Oral QHS  . mouth rinse  15 mL Mouth Rinse BID  . potassium & sodium phosphates  2 packet Oral TID WC & HS    Objective: Vital signs in last 24 hours: Temp:  [97.7 F (36.5 C)-99.4 F (37.4 C)] 99.4 F (37.4 C) (01/13 0422) Pulse Rate:  [72-100] 100 (01/13 0422) Resp:  [16-35] 16 (01/13 0422) BP: (101-119)/(38-81) 101/50 (01/13 0422) SpO2:  [97 %-100 %] 100 % (01/13 0422)   General appearance: alert, cooperative, no distress and anxious, animated  Lab Results Recent Labs    08/14/19 0452 08/15/19 0612  WBC 12.0* 11.4*  HGB 10.3* 10.6*  HCT 32.3* 33.6*  NA 139 135  K 3.5 4.0  CL 106 104  CO2 24 22  BUN 6 8  CREATININE 0.63 0.58*   Liver Panel Recent Labs    08/14/19 0452 08/15/19 0612  PROT 6.6 7.2  ALBUMIN 2.6* 2.8*  AST 35 32  ALT 31 31  ALKPHOS 74 83  BILITOT 1.7* 1.2   Sedimentation Rate No results for input(s): ESRSEDRATE in the last 72 hours. C-Reactive Protein No results for input(s): CRP in the last 72 hours.  Microbiology: Recent Results (from the past 240 hour(s))  Blood culture (routine x 2)     Status: Abnormal   Collection Time: 08/11/19 12:03 AM   Specimen: BLOOD  Result Value Ref Range Status   Specimen Description   Final    BLOOD RIGHT ARM Performed at [facility] [address], [place], [place] [address]    Special Requests   Final    BOTTLES DRAWN AEROBIC AND ANAEROBIC Blood Culture adequate volume Performed at [facility] [address], [place], [place] [address]    Culture  Setup Time   Final    GRAM POSITIVE COCCI IN CLUSTERS IN BOTH AEROBIC AND ANAEROBIC BOTTLES CRITICAL RESULT CALLED TO, READ BACK BY AND VERIFIED WITH: M. HICKS PHARMD, AT 1334 08/11/19 BY Rush Landmark Performed at [facility], [place] [address] [place], [place] [address]     Culture STAPHYLOCOCCUS AUREUS (A)  Final   Report Status 08/13/2019 FINAL  Final   Organism ID, Bacteria STAPHYLOCOCCUS AUREUS  Final      Susceptibility   Staphylococcus aureus - MIC*    CIPROFLOXACIN <=0.5 SENSITIVE Sensitive     ERYTHROMYCIN <=0.25 SENSITIVE Sensitive     GENTAMICIN <=0.5 SENSITIVE Sensitive     OXACILLIN 0.5 SENSITIVE Sensitive     TETRACYCLINE <=1 SENSITIVE Sensitive     VANCOMYCIN <=0.5 SENSITIVE Sensitive     TRIMETH/SULFA <=10 SENSITIVE Sensitive     CLINDAMYCIN <=0.25 SENSITIVE Sensitive     RIFAMPIN <=0.5 SENSITIVE Sensitive     Inducible Clindamycin NEGATIVE Sensitive     * STAPHYLOCOCCUS AUREUS  SARS CORONAVIRUS 2 (TAT 6-24 HRS) Nasopharyngeal Nasopharyngeal Swab     Status: None   Collection Time: 08/11/19  4:09 AM   Specimen: Nasopharyngeal Swab  Result Value Ref Range Status   SARS Coronavirus 2 NEGATIVE NEGATIVE Final    Comment: (NOTE) SARS-CoV-2 target nucleic acids are NOT DETECTED. The SARS-CoV-2 RNA is generally detectable in upper and lower respiratory specimens during the acute phase of infection. Negative results do not preclude SARS-CoV-2 infection, do not rule out co-infections with other pathogens, and should not be used as the sole basis for treatment or other patient management decisions. Negative results must be combined with clinical observations, patient history, and epidemiological information. The expected result is Negative. Fact Sheet for Patients: SugarRoll.be Fact Sheet for Healthcare Providers: https://www.woods-mathews.com/ This test is not yet approved or cleared by the [place] FDA and  has been authorized for detection and/or diagnosis of SARS-CoV-2 by FDA under an Emergency Use Authorization (EUA). This EUA will remain  in effect (meaning this test can be used) for the duration of the COVID-19 declaration under Section 56 4(b)(1) of the Act, 21 U.S.C. section  360bbb-3(b)(1), unless the authorization is terminated or revoked sooner. Performed at [facility], [place] [address], [place], [place] [address]   MRSA PCR Screening     Status: None   Collection Time: 08/11/19 11:00 PM   Specimen: Nasopharyngeal  Result Value Ref Range Status   MRSA by PCR NEGATIVE NEGATIVE Final    Comment:        The GeneXpert MRSA Assay (FDA approved for NASAL specimens only), is one component of a comprehensive MRSA colonization surveillance program. It is not intended to diagnose MRSA infection nor to guide or monitor treatment for MRSA infections. Performed at [facility], [place] [address], [place], [place] [address]   Culture, blood (routine x 2)  Status: None (Preliminary result)   Collection Time: 08/12/19 12:43 PM   Specimen: BLOOD LEFT ARM  Result Value Ref Range Status   Specimen Description   Final    BLOOD LEFT ARM Performed at [facility], [address], [place], [place] [address]    Special Requests   Final    BOTTLES DRAWN AEROBIC AND ANAEROBIC Blood Culture adequate volume Performed at [facility] [address] [place], [place] [address]    Culture   Final    NO GROWTH 3 DAYS Performed at [facility], [place] [address] [place], [place] [address]    Report Status PENDING  Incomplete  Culture, blood (routine x 2)     Status: None (Preliminary result)   Collection Time: 08/12/19 12:43 PM   Specimen: BLOOD  Result Value Ref Range Status   Specimen Description   Final    BLOOD BLOOD LEFT HAND Performed at [facility] [address], [place], [place] [address]    Special Requests   Final    BOTTLES DRAWN AEROBIC ONLY Blood Culture adequate volume Performed at [facility] [address] [place], [place] [address]    Culture   Final    NO GROWTH 3 DAYS Performed at [facility], [place] [address], [place], [place] [address]     Report Status PENDING  Incomplete    Studies/Results: ECHO TEE  Result Date: 08/14/2019   TRANSESOPHOGEAL ECHO REPORT   Patient Name:   Justin Chaney Date of Exam: 08/14/2019 Medical Rec #:  [id number]        Height:       62.0 in Accession #:    [id number]       Weight:       228.0 lb Date of Birth:  [birth year]        BSA:          2.02 m Patient Age:    23 years         BP:           103/45 mmHg Patient Gender: M                HR:           90 bpm. Exam Location:  Inpatient  Procedure: Transesophageal Echo, 3D Echo, Limited Color Doppler and Cardiac            Doppler Indications:     Endocarditis I38  History:         Patient has prior history of Echocardiogram examinations, most                  recent 08/12/2019. Substance abuse.  Sonographer:     Darlina Sicilian RDCS Referring Phys:  [id number] Tami Lin [facility] Diagnosing Phys: Lyman Bishop MD  PROCEDURE: Patients was monitored while under deep sedation. The transesophogeal probe was passed through the esophogus of the patient. Imaged were obtained with the patient in a supine position. Image quality was excellent. The patient developed no complications during the procedure. IMPRESSIONS  1. Left ventricular ejection fraction, by visual estimation, is 60 to 65%. The left ventricle has normal function. There is no left ventricular hypertrophy.  2. The left ventricle has no regional wall motion abnormalities.  3. Global right ventricle has normal systolic function.The right ventricular size is normal. No increase in right ventricular wall thickness.  4. Left atrial size was normal.  5. Right atrial  size was normal.  6. The pericardial effusion is circumferential.  7. Trivial pericardial effusion is present.  8. The mitral valve is grossly normal. Trivial mitral valve regurgitation.  9. Tricuspid valve visualized with 2D/3D modes. 10. Moderately sized vegetation on the tricuspid valve. 11. The tricuspid valve is abnormal. 12. The aortic valve is tricuspid.  Aortic valve regurgitation is not visualized. 13. The pulmonic valve was grossly normal. Pulmonic valve regurgitation is trivial. 14. Findings consistent with Tricuspid valve endocarditis. FINDINGS  Left Ventricle: Left ventricular ejection fraction, by visual estimation, is 60 to 65%. The left ventricle has normal function. The left ventricle has no regional wall motion abnormalities. There is no left ventricular hypertrophy. Right Ventricle: The right ventricular size is normal. No increase in right ventricular wall thickness. Global RV systolic function is has normal systolic function. Left Atrium: Left atrial size was normal in size. Right Atrium: Right atrial size was normal in size Pericardium: Trivial pericardial effusion is present. The pericardial effusion is circumferential. Mitral Valve: The mitral valve is grossly normal. Trivial mitral valve regurgitation. Tricuspid Valve: The tricuspid valve is abnormal. Tricuspid valve regurgitation mild-moderate. There is a moderately sized mobile tricuspid valve vegetation on the septal leaflet. The TV vegetation measures 15 mm x 5 mm. The flow in the hepatic veins is normal during ventricular systole. Tricuspid valve visualized with 2D/3D modes. Aortic Valve: The aortic valve is tricuspid. Aortic valve regurgitation is not visualized. Pulmonic Valve: The pulmonic valve was grossly normal. Pulmonic valve regurgitation is trivial. Aorta: The aortic root and ascending aorta are structurally normal, with no evidence of dilitation. Shunts: No atrial level shunt detected by color flow Doppler.  Lyman Bishop MD Electronically signed by Lyman Bishop MD Signature Date/Time: 08/14/2019/1:11:51 PM    Final      Assessment/Plan: MSSA bacteremia TV IE with mild-mod TR IVDA  Bipolar Chest nodule  Total days of antibiotics:5 vanco/cefepime/flagyl --> ancef  Repeat BCx 1-10 are ngtd Would favor 28 days of rx then a single dose of oritavancin or dalbavancin. This  makes his end date 09-08-19.  I am not clear that he is psychiatrically stable to go home with IV anbx.  Please send labs to ID clinic if he is d/c.  Next Hep B dose on 09-13-19, last dose 02-10-20 Next Hep dose 02-10-20 Available as needed.          Bobby Rumpf MD, FACP Infectious Diseases (pager) [phone] [email or web address] 08/15/2019, 10:23 AM  LOS: 4 days

## 2019-08-15 NOTE — Consult Note (Signed)
[facility] Face-to-Face Psychiatry Consult   Reason for Consult: Methamphetamine dependence, fentanyl abuse Referring Physician: Kamineni Patient Identification: Justin Chaney MRN:  [id number] Principal Diagnosis: Staphylococcus aureus bacteremia Diagnosis:  Principal Problem:   Staphylococcus aureus bacteremia Active Problems:   Schizoaffective disorder, bipolar type (HCC)   Tobacco use   SIRS (systemic inflammatory response syndrome) (HCC)   Normocytic anemia   Thrombocytopenia (HCC)   Polysubstance abuse (HCC)   IVDU (intravenous drug user)   S/P VP shunt   Transaminitis   Hyperbilirubinemia   Sepsis with acute organ dysfunction without septic shock ([place])   Total Time spent with patient: 30 minutes  Subjective:   Justin Chaney is a 24 y.o. male patient admitted with sepsis, requiring IV antibiotics, the patient himself states "I know where I got it, I had a clean needle, was injecting myself but it was not going right, and I let the blood sit in the needle for 4 to 5 days before I injected it again" The patient describes himself as using methamphetamines "faithfully" every day at least 2 g since he was 24 years of age and intermittently abusing fentanyl.  Drug screen was positive for amphetamines as expected  HPI:   The patient's last hospitalization with [name] was in June 2020, at that point in time he had multiple psychotic symptoms-and he has been diagnosed with a schizophrenic type condition versus schizoaffective disorder however he is now reporting that his psychotic symptoms are always propelled and induced by methamphetamines. It should be noted that he had been treated previously with antipsychotics lithium therapy so forth. At the present time he is alert and oriented to person place time and situation he denies auditory or visual hallucinations denies cravings and withdrawal symptoms.  States he would like medications for sleep.  Past Psychiatric History: Again last  admitted to our service in June with clearly psychosis but drug screen at that time was not obtained  Risk to Self:  Denies thoughts of self-harm Risk to Others:  Denies thoughts of harming others Prior Inpatient Therapy:  Last on our service in June Prior Outpatient Therapy:  Lost to follow-up  Past Medical History:  Past Medical History:  Diagnosis Date  . Asthma   . Bipolar disorder ([place])   . 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 Depression   . Homelessness   . Schizophrenia ([place])   . Seizures ([place])    childhood seizures  . Suicidal behavior   . Suicidal intent     Past Surgical History:  Procedure Laterality Date  . APPENDECTOMY    . SHUNT REMOVAL    . TEE WITHOUT CARDIOVERSION N/A 08/14/2019   Procedure: TRANSESOPHAGEAL ECHOCARDIOGRAM (TEE);  Surgeon: Pixie Casino, MD;  Location: [facility] ENDOSCOPY;  Service: Cardiovascular;  Laterality: N/A;  . TONSILLECTOMY    . VENTRICULO-PERITONEAL SHUNT PLACEMENT / LAPAROSCOPIC INSERTION PERITONEAL CATHETER     Family History:  Family History  Problem Relation Age of Onset  . Hypertension Mother   . Mental illness Neg Hx    Family Psychiatric  History: no data Social History:  Social History   Substance and Sexual Activity  Alcohol Use No     Social History   Substance and Sexual Activity  Drug Use No  . Types: Other-see comments, Amphetamines   Comment: History of use    Social History   Socioeconomic History  . Marital status: Single    Spouse name: Not on file  . Number of children: Not  on file  . Years of education: Not on file  . Highest education level: Not on file  Occupational History  . Not on file  Tobacco Use  . Smoking status: Current Every Day Smoker    Packs/day: 0.00    Types: Cigars  . Smokeless tobacco: Never Used  Substance and Sexual Activity  . Alcohol use: No  . Drug use: No    Types: Other-see comments, Amphetamines    Comment: History of use  . Sexual activity:  Not on file  Other Topics Concern  . Not on file  Social History Narrative  . Not on file   Social Determinants of Health   Financial Resource Strain:   . Difficulty of Paying Living Expenses: Not on file  Food Insecurity:   . Worried About Charity fundraiser in the Last Year: Not on file  . Ran Out of Food in the Last Year: Not on file  Transportation Needs:   . Lack of Transportation (Medical): Not on file  . Lack of Transportation (Non-Medical): Not on file  Physical Activity:   . Days of Exercise per Week: Not on file  . Minutes of Exercise per Session: Not on file  Stress:   . Feeling of Stress : Not on file  Social Connections:   . Frequency of Communication with Friends and Family: Not on file  . Frequency of Social Gatherings with Friends and Family: Not on file  . Attends Religious Services: Not on file  . Active Member of Clubs or Organizations: Not on file  . Attends Archivist Meetings: Not on file  . Marital Status: Not on file   Additional Social History:    Allergies:   Allergies  Allergen Reactions  . Amoxicillin Hives and Other (See Comments)    CHILDHOOD ALLERGY - Per [facility] records, tolerates Ancef and Rocephin Has patient had a PCN reaction causing immediate rash, facial/tongue/throat swelling, SOB or lightheadedness with hypotension: YES Has patient had a PCN reaction causing severe rash involving mucus membranes or skin necrosis: No Has patient had a PCN reaction that required hospitalization No    Labs:  Results for orders placed or performed during the hospital encounter of 08/10/19 (from the past 48 hour(s))  Comprehensive metabolic panel     Status: Abnormal   Collection Time: 08/14/19  4:52 AM  Result Value Ref Range   Sodium 139 135 - 145 mmol/L   Potassium 3.5 3.5 - 5.1 mmol/L   Chloride 106 98 - 111 mmol/L   CO2 24 22 - 32 mmol/L   Glucose, Bld 116 (H) 70 - 99 mg/dL   BUN 6 6 - 20 mg/dL   Creatinine, Ser 0.63 0.61 - 1.24  mg/dL   Calcium 8.3 (L) 8.9 - 10.3 mg/dL   Total Protein 6.6 6.5 - 8.1 g/dL   Albumin 2.6 (L) 3.5 - 5.0 g/dL   AST 35 15 - 41 U/L   ALT 31 0 - 44 U/L   Alkaline Phosphatase 74 38 - 126 U/L   Total Bilirubin 1.7 (H) 0.3 - 1.2 mg/dL   GFR calc non Af Amer >60 >60 mL/min   GFR calc Af Amer >60 >60 mL/min   Anion gap 9 5 - 15    Comment: Performed at [facility], [place] [address] [place], [place] [address]  CBC     Status: Abnormal   Collection Time: 08/14/19  4:52 AM  Result Value Ref Range   WBC 12.0 (  H) 4.0 - 10.5 K/uL   RBC 3.76 (L) 4.22 - 5.81 MIL/uL   Hemoglobin 10.3 (L) 13.0 - 17.0 g/dL   HCT 32.3 (L) 39.0 - 52.0 %   MCV 85.9 80.0 - 100.0 fL   MCH 27.4 26.0 - 34.0 pg   MCHC 31.9 30.0 - 36.0 g/dL   RDW 13.9 11.5 - 15.5 %   Platelets 217 150 - 400 K/uL   nRBC 0.0 0.0 - 0.2 %    Comment: Performed at [facility], [place] [address] [place], [place] [address]  Magnesium     Status: None   Collection Time: 08/14/19  4:52 AM  Result Value Ref Range   Magnesium 2.1 1.7 - 2.4 mg/dL    Comment: Performed at [facility], [place] [address], [place], [place] [address]  Phosphorus     Status: None   Collection Time: 08/14/19  4:52 AM  Result Value Ref Range   Phosphorus 3.5 2.5 - 4.6 mg/dL    Comment: Performed at [facility], [place] [address], [place], [place] [address]  Comprehensive metabolic panel     Status: Abnormal   Collection Time: 08/15/19  6:12 AM  Result Value Ref Range   Sodium 135 135 - 145 mmol/L   Potassium 4.0 3.5 - 5.1 mmol/L   Chloride 104 98 - 111 mmol/L   CO2 22 22 - 32 mmol/L   Glucose, Bld 120 (H) 70 - 99 mg/dL   BUN 8 6 - 20 mg/dL   Creatinine, Ser 0.58 (L) 0.61 - 1.24 mg/dL   Calcium 8.4 (L) 8.9 - 10.3 mg/dL   Total Protein 7.2 6.5 - 8.1 g/dL   Albumin 2.8 (L) 3.5 - 5.0 g/dL   AST 32 15 - 41 U/L   ALT 31 0 - 44 U/L   Alkaline Phosphatase 83 38 - 126 U/L   Total Bilirubin 1.2 0.3 -  1.2 mg/dL   GFR calc non Af Amer >60 >60 mL/min   GFR calc Af Amer >60 >60 mL/min   Anion gap 9 5 - 15    Comment: Performed at [facility], [place] [address], [place], [place] [address]  CBC     Status: Abnormal   Collection Time: 08/15/19  6:12 AM  Result Value Ref Range   WBC 11.4 (H) 4.0 - 10.5 K/uL   RBC 3.88 (L) 4.22 - 5.81 MIL/uL   Hemoglobin 10.6 (L) 13.0 - 17.0 g/dL   HCT 33.6 (L) 39.0 - 52.0 %   MCV 86.6 80.0 - 100.0 fL   MCH 27.3 26.0 - 34.0 pg   MCHC 31.5 30.0 - 36.0 g/dL   RDW 14.0 11.5 - 15.5 %   Platelets 232 150 - 400 K/uL   nRBC 0.0 0.0 - 0.2 %    Comment: Performed at [facility], [place] [address], [place], [place] [address]  Magnesium     Status: None   Collection Time: 08/15/19  6:12 AM  Result Value Ref Range   Magnesium 2.3 1.7 - 2.4 mg/dL    Comment: Performed at [facility], [place] [address] [place], [place] [address]  Phosphorus     Status: None   Collection Time: 08/15/19  6:12 AM  Result Value Ref Range   Phosphorus 3.2 2.5 - 4.6 mg/dL    Comment: Performed at [facility], [place] [address], [place], [place] [address]    Current Facility-Administered Medications  Medication  Dose Route Frequency Provider Last Rate Last Admin  . 0.9 %  sodium chloride infusion   Intravenous PRN Pixie Casino, MD      . acetaminophen (TYLENOL) tablet 650 mg  650 mg Oral Q6H PRN Pixie Casino, MD   650 mg at 08/11/19 1555   Or  . acetaminophen (TYLENOL) suppository 650 mg  650 mg Rectal Q6H PRN Pixie Casino, MD      . ceFAZolin (ANCEF) IVPB 2g/100 mL premix  2 g Intravenous Q8H Pixie Casino, MD 200 mL/hr at 08/15/19 0919 2 g at 08/15/19 0919  . enoxaparin (LOVENOX) injection 40 mg  40 mg Subcutaneous Q24H Guilford Shi, MD   40 mg at 08/15/19 0615  . guaiFENesin-dextromethorphan (ROBITUSSIN DM) 100-10 MG/5ML syrup 5 mL  5 mL Oral Q4H PRN Pixie Casino, MD   5 mL at 08/12/19 2045   . haloperidol (HALDOL) tablet 2 mg  2 mg Oral QHS Guilford Shi, MD   2 mg at 08/14/19 2103  . ibuprofen (ADVIL) tablet 200 mg  200 mg Oral Q4H PRN Pixie Casino, MD   200 mg at 08/11/19 2240  . lip balm (CARMEX) ointment 1 application  1 application Topical PRN Hilty, Nadean Corwin, MD      . MEDLINE mouth rinse  15 mL Mouth Rinse BID Pixie Casino, MD   15 mL at 08/15/19 0919  . ondansetron (ZOFRAN) tablet 4 mg  4 mg Oral Q6H PRN Pixie Casino, MD       Or  . ondansetron [facility]) injection 4 mg  4 mg Intravenous Q6H PRN Pixie Casino, MD   4 mg at 08/13/19 2008  . phenol (CHLORASEPTIC) mouth spray 1 spray  1 spray Mouth/Throat PRN Guilford Shi, MD   1 spray at 08/14/19 1732  . potassium & sodium phosphates (PHOS-NAK) 280-160-250 MG packet 2 packet  2 packet Oral TID WC & HS Hilty, Nadean Corwin, MD   2 packet at 08/15/19 0815  . traZODone (DESYREL) tablet 150 mg  150 mg Oral QHS [name], MD        Musculoskeletal: Strength & Muscle Tone: within normal limits Gait & Station: normal Patient leans: N/A  Psychiatric Specialty Exam: Physical Exam  Review of Systems  Blood pressure (!) 101/50, pulse 100, temperature 99.4 F (37.4 C), resp. rate 16, height 5\' 2"  (1.575 m), weight 103.4 kg, SpO2 100 %.Body mass index is 41.69 kg/m.  General Appearance: Casual  Eye Contact:  Good  Speech:  Clear and Coherent  Volume:  Normal  Mood:  Euthymic  Affect:  Congruent  Thought Process:  Coherent and Linear  Orientation:  Full (Time, Place, and Person)  Thought Content:  Logical  Suicidal Thoughts:  No  Homicidal Thoughts:  No  Memory:  Immediate;   Good Recent;   Good Remote;   Good  Judgement:  Good  Insight: Generally intact  Psychomotor Activity:  Normal  Concentration:  Concentration: Good and Attention Span: Good  Recall:  Good  Fund of Knowledge:  Good  Language:  Good  Akathisia:  Negative  Handed:  Right  AIMS (if indicated):     Assets:  Leisure  Time Physical Health Resilience  ADL's:  Intact  Cognition:  WNL  Sleep:      Assessment 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    [name], MD 08/15/2019 11:16 AM

## 2019-08-15 NOTE — Progress Notes (Signed)
PROGRESS NOTE    Danzel Cissell  [id number] DOB: 05/11/1996 DOA: 08/10/2019 PCP: Patient, No Pcp Per    Brief Narrative:  24 year old gentleman with history of bipolar disorder, VP shunt, schizophrenia and IV drug use admitted to hospital with fever chills myalgia, frontal headache and shortness of breath of 1 week.  In the emergency room he was febrile with temperature 103, tachycardic 130, blood pressure low normal responded to fluid.  Lactic acidosis and platelets of 42.  Patient was empirically treated with antibiotics.  Blood cultures with MSSA and echocardiogram with endocarditis.   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  Sepsis resolving. Initially treated with broad-spectrum, now on Ancef. As per ID recommendation, Ancef until 09/08/2019 and a dose of dalbavancin before discharge.  Transient anemia thrombocytopenia: Due to sepsis.  Improved.  History of drug use: Patient has been using heroin recently.  No evidence of withdrawal.  Does not want any treatment.  Patient states that he can take care of himself.  Schizophrenia: Not taking medicine for a while.  Seen by psychiatry.  Started on Seroquel and Haldol at night.   DVT prophylaxis: Lovenox subcu Code Status: Full code Family Communication: None Disposition Plan: Remains in the hospital.  Not a PICC line candidate with active  drug use.   Consultants:   Infectious disease  Psychiatry  Procedures:   None  Antimicrobials:  Anti-infectives (From admission, onward)   Start     Dose/Rate Route Frequency Ordered Stop   08/12/19 1800  ceFAZolin (ANCEF) IVPB 2g/100 mL premix     2 g 200 mL/hr over 30 Minutes Intravenous Every 8 hours 08/12/19 1251     08/11/19 1400  metroNIDAZOLE (FLAGYL) IVPB 500 mg  Status:  Discontinued     500 mg 100 mL/hr over 60 Minutes Intravenous Every 8 hours 08/11/19 0556 08/12/19 1229   08/11/19 1000  ceFEPIme (MAXIPIME) 2 g in sodium chloride 0.9 % 100 mL IVPB  Status:  Discontinued     2 g 200 mL/hr over 30 Minutes Intravenous Every 8 hours 08/11/19 0330 08/12/19 1229   08/11/19 0600  vancomycin (VANCOREADY) IVPB 1750 mg/350 mL  Status:  Discontinued     1,750 mg 175 mL/hr over 120 Minutes Intravenous Every 12 hours 08/11/19 0330 08/11/19 0335   08/11/19 0600  vancomycin (VANCOREADY) IVPB 1250 mg/250 mL  Status:  Discontinued     1,250 mg 166.7 mL/hr over 90 Minutes Intravenous Every 12 hours 08/11/19 0335 08/13/19 0928   08/11/19 0200  ceFEPIme (MAXIPIME) 2 g in sodium chloride 0.9 % 100 mL IVPB     2 g 200 mL/hr over 30 Minutes Intravenous  Once 08/11/19 0149 08/11/19 0257   08/11/19 0145  metroNIDAZOLE (FLAGYL) IVPB 500 mg     500 mg 100 mL/hr over 60 Minutes Intravenous  Once 08/11/19 0135 08/11/19 0623   08/11/19 0145  vancomycin (VANCOCIN) IVPB 1000 mg/200 mL premix  1,000 mg 200 mL/hr over 60 Minutes Intravenous  Once 08/11/19 0142 08/11/19 0509         Subjective: Patient seen and examined.  Denies any complaints.  He was wondering when he is able to go home.  He was surprised to hear that he is going to stay in the hospital for more than a month.  Afebrile.  Objective: Vitals:   08/14/19 2031 08/14/19 2200 08/15/19 0000 08/15/19 0422  BP: (!) 109/52   (!) 101/50  Pulse: 96   100  Resp: (!) 28 (!) 21 20 16   Temp: 98.7 F (37.1 C)   99.4 F (37.4 C)    TempSrc:      SpO2:    100%  Weight:      Height:        Intake/Output Summary (Last 24 hours) at 08/15/2019 1247 Last data filed at 08/15/2019 1200 Gross per 24 hour  Intake 2535 ml  Output 1875 ml  Net 660 ml   Filed Weights   08/12/19 0000  Weight: 103.4 kg    Examination:  General exam: Appears calm and comfortable  Respiratory system: Clear to auscultation. Respiratory effort normal. Cardiovascular system: S1 & S2 heard, RRR. No JVD, murmurs, rubs, gallops or clicks. No pedal edema. Gastrointestinal system: Abdomen is nondistended, soft and nontender. No organomegaly or masses felt. Normal bowel sounds heard. Central nervous system: Alert and oriented. No focal neurological deficits. Extremities: Symmetric 5 x 5 power. Skin: No rashes, lesions or ulcers Psychiatry: Judgement and insight appear normal. Mood & affect appropriate.     Data Reviewed: I have personally reviewed following labs and imaging studies  CBC: Recent Labs  Lab 08/11/19 0003 08/11/19 0556 08/11/19 0647 08/12/19 0120 08/13/19 0519 08/14/19 0452 08/15/19 0612  WBC 4.3 9.0  --  10.6* 12.3* 12.0* 11.4*  NEUTROABS 3.6 7.7  --   --   --   --   --   HGB 11.7* 14.5  --  10.4* 10.2* 10.3* 10.6*  HCT 35.9* 45.3  --  32.4* 31.8* 32.3* 33.6*  MCV 84.3 85.0  --  83.9 84.8 85.9 86.6  PLT 42* 121* 97* 107* 154 217 [id number]   Basic Metabolic Panel: Recent Labs  Lab 08/11/19 0556 08/12/19 0120 08/13/19 0208 08/14/19 0452 08/15/19 0612  NA 134* 133* 138 139 135  K 3.2* 3.0* 3.4* 3.5 4.0  CL 98 100 106 106 104  CO2 24 23 22 24 22   GLUCOSE 104* 137* 112* 116* 120*  BUN 9 8 7 6 8   CREATININE 0.79 0.67 0.72 0.63 0.58*  CALCIUM 8.4* 7.9* 7.9* 8.3* 8.4*  MG 2.0 2.2 1.9 2.1 2.3  PHOS  --  1.1* 2.9 3.5 3.2   GFR: Estimated Creatinine Clearance: 147.9 mL/min (A) (by C-G formula based on SCr of 0.58 mg/dL (L)). Liver Function Tests: Recent Labs  Lab 08/11/19 0556 08/12/19 0120 08/13/19 0208  08/14/19 0452 08/15/19 0612  AST 57* 38 34 35 32  ALT 54* 35 32 31 31  ALKPHOS 110 71 68 74 83  BILITOT 7.5* 5.0* 2.5* 1.7* 1.2  PROT 7.7 5.9* 5.7* 6.6 7.2  ALBUMIN 3.4* 2.7* 2.5* 2.6* 2.8*   Recent Labs  Lab 08/11/19 0003  LIPASE 20   No results for input(s): AMMONIA in the last 168 hours. Coagulation Profile: Recent Labs  Lab 08/11/19 0647  INR 1.2   Cardiac Enzymes: Recent Labs  Lab 08/11/19 0800 08/12/19 0820  CKTOTAL 827* 471*   BNP (last 3  results) No results for input(s): PROBNP in the last 8760 hours. HbA1C: No results for input(s): HGBA1C in the last 72 hours. CBG: No results for input(s): GLUCAP in the last 168 hours. Lipid Profile: No results for input(s): CHOL, HDL, LDLCALC, TRIG, CHOLHDL, LDLDIRECT in the last 72 hours. Thyroid Function Tests: No results for input(s): TSH, T4TOTAL, FREET4, T3FREE, THYROIDAB in the last 72 hours. Anemia Panel: No results for input(s): VITAMINB12, FOLATE, FERRITIN, TIBC, IRON, RETICCTPCT in the last 72 hours. Sepsis Labs: Recent Labs  Lab 08/11/19 0409 08/11/19 0647 08/12/19 0932 08/12/19 1233  LATICACIDVEN 2.4* 2.4* 1.8 1.5    Recent Results (from the past 240 hour(s))  Blood culture (routine x 2)     Status: Abnormal   Collection Time: 08/11/19 12:03 AM   Specimen: BLOOD  Result Value Ref Range Status   Specimen Description   Final    BLOOD RIGHT ARM Performed at [facility] [address] [place], [place] [address]    Special Requests   Final    BOTTLES DRAWN AEROBIC AND ANAEROBIC Blood Culture adequate volume Performed at [facility] [address], [place], [place] [address]    Culture  Setup Time   Final    GRAM POSITIVE COCCI IN CLUSTERS IN BOTH AEROBIC AND ANAEROBIC BOTTLES CRITICAL RESULT CALLED TO, READ BACK BY AND VERIFIED WITH: M. HICKS PHARMD, AT 1334 08/11/19 BY Rush Landmark Performed at [facility], [place] [address] [place], [place] [address]     Culture STAPHYLOCOCCUS AUREUS (A)  Final   Report Status 08/13/2019 FINAL  Final   Organism ID, Bacteria STAPHYLOCOCCUS AUREUS  Final      Susceptibility   Staphylococcus aureus - MIC*    CIPROFLOXACIN <=0.5 SENSITIVE Sensitive     ERYTHROMYCIN <=0.25 SENSITIVE Sensitive     GENTAMICIN <=0.5 SENSITIVE Sensitive     OXACILLIN 0.5 SENSITIVE Sensitive     TETRACYCLINE <=1 SENSITIVE Sensitive     VANCOMYCIN <=0.5 SENSITIVE Sensitive     TRIMETH/SULFA <=10 SENSITIVE Sensitive     CLINDAMYCIN <=0.25 SENSITIVE Sensitive     RIFAMPIN <=0.5 SENSITIVE Sensitive     Inducible Clindamycin NEGATIVE Sensitive     * STAPHYLOCOCCUS AUREUS  SARS CORONAVIRUS 2 (TAT 6-24 HRS) Nasopharyngeal Nasopharyngeal Swab     Status: None   Collection Time: 08/11/19  4:09 AM   Specimen: Nasopharyngeal Swab  Result Value Ref Range Status   SARS Coronavirus 2 NEGATIVE NEGATIVE Final    Comment: (NOTE) SARS-CoV-2 target nucleic acids are NOT DETECTED. The SARS-CoV-2 RNA is generally detectable in upper and lower respiratory specimens during the acute phase of infection. Negative results do not preclude SARS-CoV-2 infection, do not rule out co-infections with other pathogens, and should not be used as the sole basis for treatment or other patient management decisions. Negative results must be combined with clinical observations, patient history, and epidemiological information. The expected result is Negative. Fact Sheet for Patients: SugarRoll.be Fact Sheet for Healthcare Providers: https://www.woods-mathews.com/ This test is not yet approved or cleared by the [place] FDA and  has been authorized for detection and/or diagnosis of SARS-CoV-2 by FDA under an Emergency Use Authorization (EUA). This EUA will remain  in effect (meaning this test can be used) for the duration of the COVID-19 declaration under Section 56 4(b)(1) of the Act, 21 U.S.C. section  360bbb-3(b)(1), unless the authorization is terminated or revoked sooner. Performed at [facility], [place] [address], [place], [place]  [id number]   MRSA PCR Screening     Status: None   Collection Time: 08/11/19 11:00 PM   Specimen: Nasopharyngeal  Result Value Ref Range Status   MRSA by PCR NEGATIVE NEGATIVE Final    Comment:        The GeneXpert MRSA Assay (FDA approved for NASAL specimens only), is one component of a comprehensive MRSA colonization surveillance program. It is not intended to diagnose MRSA infection nor to guide or monitor treatment for MRSA infections. Performed at [facility], [place] [address], [place], [place] [address]   Culture, blood (routine x 2)     Status: None (Preliminary result)   Collection Time: 08/12/19 12:43 PM   Specimen: BLOOD LEFT ARM  Result Value Ref Range Status   Specimen Description   Final    BLOOD LEFT ARM Performed at [facility], [place] [address], [place], [place] [address]    Special Requests   Final    BOTTLES DRAWN AEROBIC AND ANAEROBIC Blood Culture adequate volume Performed at [facility] [address] [place], [place] [address]    Culture   Final    NO GROWTH 3 DAYS Performed at [facility], [place] [address], [place], [place] [address]    Report Status PENDING  Incomplete  Culture, blood (routine x 2)     Status: None (Preliminary result)   Collection Time: 08/12/19 12:43 PM   Specimen: BLOOD  Result Value Ref Range Status   Specimen Description   Final    BLOOD BLOOD LEFT HAND Performed at [facility] [address], [place], [place] [address]    Special Requests   Final    BOTTLES DRAWN AEROBIC ONLY Blood Culture adequate volume Performed at [facility] [address] [place], [place] [address]    Culture   Final    NO GROWTH 3 DAYS Performed at [facility], [place] [address], [place], [place] [address]     Report Status PENDING  Incomplete         Radiology Studies: ECHO TEE  Result Date: 08/14/2019   TRANSESOPHOGEAL ECHO REPORT   Patient Name:   FAISAL NELLUM Date of Exam: 08/14/2019 Medical Rec #:  [id number]        Height:       62.0 in Accession #:    [id number]       Weight:       228.0 lb Date of Birth:  07-17-1996        BSA:          2.02 m Patient Age:    23 years         BP:           103/45 mmHg Patient Gender: M                HR:           90 bpm. Exam Location:  Inpatient  Procedure: Transesophageal Echo, 3D Echo, Limited Color Doppler and Cardiac            Doppler Indications:     Endocarditis I38  History:         Patient has prior history of Echocardiogram examinations, most                  recent 08/12/2019. Substance abuse.  Sonographer:     Darlina Sicilian RDCS Referring Phys:  [id number] [place] [facility] Diagnosing  Phys: Lyman Bishop MD  PROCEDURE: Patients was monitored while under deep sedation. The transesophogeal probe was passed through the esophogus of the patient. Imaged were obtained with the patient in a supine position. Image quality was excellent. The patient developed no complications during the procedure. IMPRESSIONS  1. Left ventricular ejection fraction, by visual estimation, is 60 to 65%. The left ventricle has normal function. There is no left ventricular hypertrophy.  2. The left ventricle has no regional wall motion abnormalities.  3. Global right ventricle has normal systolic function.The right ventricular size is normal. No increase in right ventricular wall thickness.  4. Left atrial size was normal.  5. Right atrial size was normal.  6. The pericardial effusion is circumferential.  7. Trivial pericardial effusion is present.  8. The mitral valve is grossly normal. Trivial mitral valve regurgitation.  9. Tricuspid valve visualized with 2D/3D modes. 10. Moderately sized vegetation on the tricuspid valve. 11. The tricuspid valve is abnormal. 12. The aortic valve is  tricuspid. Aortic valve regurgitation is not visualized. 13. The pulmonic valve was grossly normal. Pulmonic valve regurgitation is trivial. 14. Findings consistent with Tricuspid valve endocarditis. FINDINGS  Left Ventricle: Left ventricular ejection fraction, by visual estimation, is 60 to 65%. The left ventricle has normal function. The left ventricle has no regional wall motion abnormalities. There is no left ventricular hypertrophy. Right Ventricle: The right ventricular size is normal. No increase in right ventricular wall thickness. Global RV systolic function is has normal systolic function. Left Atrium: Left atrial size was normal in size. Right Atrium: Right atrial size was normal in size Pericardium: Trivial pericardial effusion is present. The pericardial effusion is circumferential. Mitral Valve: The mitral valve is grossly normal. Trivial mitral valve regurgitation. Tricuspid Valve: The tricuspid valve is abnormal. Tricuspid valve regurgitation mild-moderate. There is a moderately sized mobile tricuspid valve vegetation on the septal leaflet. The TV vegetation measures 15 mm x 5 mm. The flow in the hepatic veins is normal during ventricular systole. Tricuspid valve visualized with 2D/3D modes. Aortic Valve: The aortic valve is tricuspid. Aortic valve regurgitation is not visualized. Pulmonic Valve: The pulmonic valve was grossly normal. Pulmonic valve regurgitation is trivial. Aorta: The aortic root and ascending aorta are structurally normal, with no evidence of dilitation. Shunts: No atrial level shunt detected by color flow Doppler.  Lyman Bishop MD Electronically signed by Lyman Bishop MD Signature Date/Time: 08/14/2019/1:11:51 PM    Final         Scheduled Meds: . enoxaparin (LOVENOX) injection  40 mg Subcutaneous Q24H  . haloperidol  2 mg Oral QHS  . mouth rinse  15 mL Mouth Rinse BID  . potassium & sodium phosphates  2 packet Oral TID WC & HS  . QUEtiapine  300 mg Oral QHS    Continuous Infusions: . sodium chloride    .  ceFAZolin (ANCEF) IV 2 g (08/15/19 0919)     LOS: 4 days    Time spent: 25 minutes    Barb Merino, MD [facility] Pager [phone]

## 2019-08-16 LAB — COMPREHENSIVE METABOLIC PANEL
ALT: 33 U/L (ref 0–44)
AST: 30 U/L (ref 15–41)
Albumin: 2.7 g/dL — ABNORMAL LOW (ref 3.5–5.0)
Alkaline Phosphatase: 83 U/L (ref 38–126)
Anion gap: 6 (ref 5–15)
BUN: 10 mg/dL (ref 6–20)
CO2: 25 mmol/L (ref 22–32)
Calcium: 8.3 mg/dL — ABNORMAL LOW (ref 8.9–10.3)
Chloride: 106 mmol/L (ref 98–111)
Creatinine, Ser: 0.59 mg/dL — ABNORMAL LOW (ref 0.61–1.24)
GFR calc Af Amer: >60 mL/min (ref >60)
GFR calc non Af Amer: >60 mL/min (ref >60)
Glucose, Bld: 119 mg/dL — ABNORMAL HIGH (ref 70–99)
Potassium: 4.2 mmol/L (ref 3.5–5.1)
Sodium: 137 mmol/L (ref 135–145)
Total Bilirubin: 0.9 mg/dL (ref 0.3–1.2)
Total Protein: 7 g/dL (ref 6.5–8.1)

## 2019-08-16 LAB — CBC
HCT: 33.5 % — ABNORMAL LOW (ref 39.0–52.0)
Hemoglobin: 10.3 g/dL — ABNORMAL LOW (ref 13.0–17.0)
MCH: 27.1 pg (ref 26.0–34.0)
MCHC: 30.7 g/dL (ref 30.0–36.0)
MCV: 88.2 fL (ref 80.0–100.0)
Platelets: 393 10*3/uL (ref 150–400)
RBC: 3.8 MIL/uL — ABNORMAL LOW (ref 4.22–5.81)
RDW: 14.4 % (ref 11.5–15.5)
WBC: 10.1 10*3/uL (ref 4.0–10.5)
nRBC: 0 % (ref 0.0–0.2)

## 2019-08-16 LAB — PHOSPHORUS: Phosphorus: 4.2 mg/dL (ref 2.5–4.6)

## 2019-08-16 LAB — MAGNESIUM: Magnesium: 2.2 mg/dL (ref 1.7–2.4)

## 2019-08-16 NOTE — Progress Notes (Signed)
afvss His repeat BCx drawn 24h after his initial is positive.  Will repeat his BCx today and continue to watch.  No change in his current rx

## 2019-08-16 NOTE — TOC Progression Note (Signed)
Transition of Care [facility]) - Progression Note    Patient Details  Name: Justin Chaney MRN: [id number] Date of Birth: 1996/06/13  Transition of Care [facility]) CM/SW [place], [place] Phone Number: 08/16/2019, 4:06 PM  Clinical Narrative:   Spoke with Georga Kaufmann, Director of Care Management about this case.  Thedore Mins states that we will not currently be able to find a rehab placement for this patient based on his recent vast experience with this particular population, and suggests that I talk to the Dr further about the possibility of a time frame for switching to PO abx. TOC will continue to follow during the course of hospitalization.    Expected Discharge Plan: Skilled Nursing Facility Barriers to Discharge: SNF Pending bed offer  Expected Discharge Plan and Services Expected Discharge Plan: [facility]   Discharge Planning Services: CM Consult   Living arrangements for the past 2 months: No permanent address                                       Social Determinants of Health (SDOH) Interventions    Readmission Risk Interventions No flowsheet data found.

## 2019-08-16 NOTE — Progress Notes (Signed)
PROGRESS NOTE    Justin Chaney  [id number] DOB: 09/12/1995 DOA: 08/10/2019 PCP: Patient, No Pcp Per    Brief Narrative:  24 year old gentleman with history of bipolar disorder, VP shunt, schizophrenia and IV drug use admitted to hospital with fever chills myalgia, frontal headache and shortness of breath of 1 week.  In the emergency room he was febrile with temperature 103, tachycardic 130, blood pressure low normal responded to fluid.  Lactic acidosis and platelets of 42.  Patient was empirically treated with antibiotics.  Blood cultures with MSSA and echocardiogram with endocarditis.   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  Drawn within 24 hours. Repeat blood cultures, 08/16/2019. Clinically improving.  Sepsis resolving. Initially treated with broad-spectrum, now on Ancef. As per ID recommendation, Ancef until 09/08/2019 and a dose of dalbavancin before discharge.  Transient anemia thrombocytopenia: Due to sepsis.  Improved.  History of drug use: Patient has been using heroin recently.  No evidence of withdrawal.  Does not want any treatment.  Patient states that he can take care of himself.  Schizophrenia: Not taking medicine for a while.  Seen by psychiatry.  Started on Seroquel and Haldol at night.   DVT prophylaxis: Lovenox subcu Code Status: Full code Family Communication: None Disposition Plan:  Remains in the hospital.  Not a PICC line candidate with active drug use.   Consultants:   Infectious disease  Psychiatry  Procedures:   None  Antimicrobials:  Anti-infectives (From admission, onward)   Start     Dose/Rate Route Frequency Ordered Stop   08/12/19 1800  ceFAZolin (ANCEF) IVPB 2g/100 mL premix     2 g 200 mL/hr over 30 Minutes Intravenous Every 8 hours 08/12/19 1251     08/11/19 1400  metroNIDAZOLE (FLAGYL) IVPB 500 mg  Status:  Discontinued     500 mg 100 mL/hr over 60 Minutes Intravenous Every 8 hours 08/11/19 0556 08/12/19 1229   08/11/19 1000  ceFEPIme (MAXIPIME) 2 g in sodium chloride 0.9 % 100 mL IVPB  Status:  Discontinued     2 g 200 mL/hr over 30 Minutes Intravenous Every 8 hours 08/11/19 0330 08/12/19 1229   08/11/19 0600  vancomycin (VANCOREADY) IVPB 1750 mg/350 mL  Status:  Discontinued     1,750 mg 175 mL/hr over 120 Minutes Intravenous Every 12 hours 08/11/19 0330 08/11/19 0335   08/11/19 0600  vancomycin (VANCOREADY) IVPB 1250 mg/250 mL  Status:  Discontinued     1,250 mg 166.7 mL/hr over 90 Minutes Intravenous Every 12 hours 08/11/19 0335 08/13/19 0928   08/11/19 0200  ceFEPIme (MAXIPIME) 2 g in sodium chloride 0.9 % 100 mL IVPB     2 g 200 mL/hr over 30 Minutes Intravenous  Once 08/11/19 0149 08/11/19 0257   08/11/19 0145  metroNIDAZOLE (FLAGYL) IVPB 500 mg     500 mg 100 mL/hr over 60 Minutes Intravenous  Once 08/11/19 0135 08/11/19 0623   08/11/19 0145  vancomycin (VANCOCIN)  IVPB 1000 mg/200 mL premix     1,000 mg 200 mL/hr over 60 Minutes Intravenous  Once 08/11/19 0142 08/11/19 0509         Subjective: No overnight events.  Denies any complaints.  Wondering about going home.  Remains afebrile.  Objective: Vitals:   08/15/19 0422 08/15/19 1331 08/15/19 2031 08/16/19 0536  BP: (!) 101/50 115/76 (!) 120/97 112/62  Pulse: 100 97 (!) 106 96  Resp: 16 18 18 19   Temp: 99.4 F (37.4 C) 98.5 F (36.9 C) 98.5 F (36.9 C) 97.6 F (36.4  C)  TempSrc:  Oral Oral Oral  SpO2: 100% 98% 100% 96%  Weight:      Height:        Intake/Output Summary (Last 24 hours) at 08/16/2019 1057 Last data filed at 08/16/2019 0900 Gross per 24 hour  Intake 820 ml  Output 500 ml  Net 320 ml   Filed Weights   08/12/19 0000  Weight: 103.4 kg    Examination:  General exam: Appears calm and comfortable  Respiratory system: Clear to auscultation. Respiratory effort normal. Cardiovascular system: S1 & S2 heard, RRR. No JVD, murmurs, rubs, gallops or clicks. No pedal edema. Gastrointestinal system: Abdomen is nondistended, soft and nontender. No organomegaly or masses felt. Normal bowel sounds heard. Central nervous system: Alert and oriented. No focal neurological deficits. Extremities: Symmetric 5 x 5 power. Skin: No rashes, lesions or ulcers Psychiatry: Judgement and insight appear normal. Mood & affect appropriate.     Data Reviewed: I have personally reviewed following labs and imaging studies  CBC: Recent Labs  Lab 08/11/19 0003 08/11/19 0003 08/11/19 0556 08/11/19 [id number] 08/12/19 0120 08/13/19 0519 08/14/19 0452 08/15/19 0612 08/16/19 0458  WBC 4.3   < > 9.0   < > 10.6* 12.3* 12.0* 11.4* 10.1  NEUTROABS 3.6  --  7.7  --   --   --   --   --   --   HGB 11.7*   < > 14.5   < > 10.4* 10.2* 10.3* 10.6* 10.3*  HCT 35.9*   < > 45.3   < > 32.4* 31.8* 32.3* 33.6* 33.5*  MCV 84.3   < > 85.0   < > 83.9 84.8 85.9 86.6 88.2  PLT 42*   < > 121*   < > 107* 154 217 232 393   < > = values in this interval not displayed.   Basic Metabolic Panel: Recent Labs  Lab 08/12/19 0120 08/13/19 0208 08/14/19 0452 08/15/19 0612 08/16/19 0458  NA 133* 138 139 135 137  K 3.0* 3.4* 3.5 4.0 4.2  CL 100 106 106 104 106  CO2 23 22 24 22 25   GLUCOSE 137* 112* 116* 120* 119*  BUN 8 7 6 8 10   CREATININE 0.67 0.72 0.63 0.58* 0.59*  CALCIUM 7.9* 7.9* 8.3* 8.4* 8.3*  MG 2.2 1.9 2.1 2.3 2.2  PHOS 1.1* 2.9 3.5 3.2 4.2   GFR: Estimated Creatinine  Clearance: 147.9 mL/min (A) (by C-G formula based on SCr of 0.59 mg/dL (L)). Liver Function Tests: Recent Labs  Lab 08/12/19 0120 08/13/19 0208 08/14/19 0452 08/15/19 0612 08/16/19 0458  AST 38 34 35 32 30  ALT 35 32 31 31 33  ALKPHOS 71 68 74 83 83  BILITOT 5.0* 2.5* 1.7* 1.2 0.9  PROT 5.9* 5.7* 6.6 7.2 7.0  ALBUMIN 2.7* 2.5* 2.6* 2.8* 2.7*   Recent Labs  Lab 08/11/19 0003  LIPASE 20   No results for input(s):  AMMONIA in the last 168 hours. Coagulation Profile: Recent Labs  Lab 08/11/19 0647  INR 1.2   Cardiac Enzymes: Recent Labs  Lab 08/11/19 0800 08/12/19 0820  CKTOTAL 827* 471*   BNP (last 3 results) No results for input(s): PROBNP in the last 8760 hours. HbA1C: No results for input(s): HGBA1C in the last 72 hours. CBG: No results for input(s): GLUCAP in the last 168 hours. Lipid Profile: No results for input(s): CHOL, HDL, LDLCALC, TRIG, CHOLHDL, LDLDIRECT in the last 72 hours. Thyroid Function Tests: No results for input(s): TSH, T4TOTAL, FREET4, T3FREE, THYROIDAB in the last 72 hours. Anemia Panel: No results for input(s): VITAMINB12, FOLATE, FERRITIN, TIBC, IRON, RETICCTPCT in the last 72 hours. Sepsis Labs: Recent Labs  Lab 08/11/19 0409 08/11/19 0647 08/12/19 0932 08/12/19 1233  LATICACIDVEN 2.4* 2.4* 1.8 1.5    Recent Results (from the past 240 hour(s))  Blood culture (routine x 2)     Status: Abnormal   Collection Time: 08/11/19 12:03 AM   Specimen: BLOOD  Result Value Ref Range Status   Specimen Description   Final    BLOOD RIGHT ARM Performed at [facility] [address] [place],  [address]    Special Requests   Final    BOTTLES DRAWN AEROBIC AND ANAEROBIC Blood Culture adequate volume Performed at [facility] [address], [place], [place] [address]    Culture  Setup Time   Final    GRAM POSITIVE COCCI IN CLUSTERS IN BOTH AEROBIC AND ANAEROBIC BOTTLES CRITICAL RESULT CALLED  TO, READ BACK BY AND VERIFIED WITH: M. HICKS PHARMD, AT 1334 08/11/19 BY Rush Landmark Performed at [facility], [place] [address] [place],  [address]    Culture STAPHYLOCOCCUS AUREUS (A)  Final   Report Status 08/13/2019 FINAL  Final   Organism ID, Bacteria STAPHYLOCOCCUS AUREUS  Final      Susceptibility   Staphylococcus aureus - MIC*    CIPROFLOXACIN <=0.5 SENSITIVE Sensitive     ERYTHROMYCIN <=0.25 SENSITIVE Sensitive     GENTAMICIN <=0.5 SENSITIVE Sensitive     OXACILLIN 0.5 SENSITIVE Sensitive     TETRACYCLINE <=1 SENSITIVE Sensitive     VANCOMYCIN <=0.5 SENSITIVE Sensitive     TRIMETH/SULFA <=10 SENSITIVE Sensitive     CLINDAMYCIN <=0.25 SENSITIVE Sensitive     RIFAMPIN <=0.5 SENSITIVE Sensitive     Inducible Clindamycin NEGATIVE Sensitive     * STAPHYLOCOCCUS AUREUS  SARS CORONAVIRUS 2 (TAT 6-24 HRS) Nasopharyngeal Nasopharyngeal Swab     Status: None   Collection Time: 08/11/19  4:09 AM   Specimen: Nasopharyngeal Swab  Result Value Ref Range Status   SARS Coronavirus 2 NEGATIVE NEGATIVE Final    Comment: (NOTE) SARS-CoV-2 target nucleic acids are NOT DETECTED. The SARS-CoV-2 RNA is generally detectable in upper and lower respiratory specimens during the acute phase of infection. Negative results do not preclude SARS-CoV-2 infection, do not rule out co-infections with other pathogens, and should not be used as the sole basis for treatment or other patient management decisions. Negative results must be combined with clinical observations, patient history, and epidemiological information. The expected result is Negative. Fact Sheet for Patients: SugarRoll.be Fact Sheet for Healthcare Providers: https://www.woods-mathews.com/ This test is not yet approved or cleared by the [place] FDA and  has been authorized for detection and/or diagnosis of SARS-CoV-2 by FDA under an Emergency Use Authorization (EUA). This EUA will  remain  in effect (meaning this test can be used)  for the duration of the COVID-19 declaration under Section 56 4(b)(1) of the Act, 21 U.S.C. section 360bbb-3(b)(1), unless the authorization is terminated or revoked sooner. Performed at [facility], [place] [address] [place], [place] [address]   MRSA PCR Screening     Status: None   Collection Time: 08/11/19 11:00 PM   Specimen: Nasopharyngeal  Result Value Ref Range Status   MRSA by PCR NEGATIVE NEGATIVE Final    Comment:        The GeneXpert MRSA Assay (FDA approved for NASAL specimens only), is one component of a comprehensive MRSA colonization surveillance program. It is not intended to diagnose MRSA infection nor to guide or monitor treatment for MRSA infections. Performed at [facility], [place] [address], [place], [place] [address]   Culture, blood (routine x 2)     Status: Abnormal (Preliminary result)   Collection Time: 08/12/19 12:43 PM   Specimen: BLOOD LEFT ARM  Result Value Ref Range Status   Specimen Description   Final    BLOOD LEFT ARM Performed at [facility] [address] [place], [place] [address]    Special Requests   Final    BOTTLES DRAWN AEROBIC AND ANAEROBIC Blood Culture adequate volume Performed at [facility] [address], [place], [place] [address]    Culture  Setup Time   Final    ANAEROBIC BOTTLE ONLY GRAM POSITIVE COCCI CRITICAL VALUE NOTED.  VALUE IS CONSISTENT WITH PREVIOUSLY REPORTED AND CALLED VALUE.    Culture (A)  Final    STAPHYLOCOCCUS AUREUS SUSCEPTIBILITIES PERFORMED ON PREVIOUS CULTURE WITHIN THE LAST 5 DAYS. Performed at [facility], [place] [address], [place], [place] [address]    Report Status PENDING  Incomplete  Culture, blood (routine x 2)     Status: None (Preliminary result)   Collection Time: 08/12/19 12:43 PM   Specimen: BLOOD  Result Value Ref Range Status   Specimen Description   Final    BLOOD BLOOD  LEFT HAND Performed at [facility] [address], [place], [place] [address]    Special Requests   Final    BOTTLES DRAWN AEROBIC ONLY Blood Culture adequate volume Performed at [facility] [address] [place], [place] [address]    Culture   Final    NO GROWTH 4 DAYS Performed at [facility], [place] [address], [place],  [address]    Report Status PENDING  Incomplete         Radiology Studies: ECHO TEE  Result Date: 08/14/2019   TRANSESOPHOGEAL ECHO REPORT   Patient Name:   BRODEN MAZIN Date of Exam: 08/14/2019 Medical Rec #:  [id number]        Height:       62.0 in Accession #:    [id number]       Weight:       228.0 lb Date of Birth:  [birth year]        BSA:          2.02 m Patient Age:    23 years         BP:           103/45 mmHg Patient Gender: M                HR:           90 bpm. Exam Location:  Inpatient  Procedure: Transesophageal Echo, 3D Echo, Limited Color  Doppler and Cardiac            Doppler Indications:     Endocarditis I38  History:         Patient has prior history of Echocardiogram examinations, most                  recent 08/12/2019. Substance abuse.  Sonographer:     Darlina Sicilian RDCS Referring Phys:  [id number] Tami Lin [facility] Diagnosing Phys: Lyman Bishop MD  PROCEDURE: Patients was monitored while under deep sedation. The transesophogeal probe was passed through the esophogus of the patient. Imaged were obtained with the patient in a supine position. Image quality was excellent. The patient developed no complications during the procedure. IMPRESSIONS  1. Left ventricular ejection fraction, by visual estimation, is 60 to 65%. The left ventricle has normal function. There is no left ventricular hypertrophy.  2. The left ventricle has no regional wall motion abnormalities.  3. Global right ventricle has normal systolic function.The right ventricular size is normal. No increase in right ventricular wall thickness.   4. Left atrial size was normal.  5. Right atrial size was normal.  6. The pericardial effusion is circumferential.  7. Trivial pericardial effusion is present.  8. The mitral valve is grossly normal. Trivial mitral valve regurgitation.  9. Tricuspid valve visualized with 2D/3D modes. 10. Moderately sized vegetation on the tricuspid valve. 11. The tricuspid valve is abnormal. 12. The aortic valve is tricuspid. Aortic valve regurgitation is not visualized. 13. The pulmonic valve was grossly normal. Pulmonic valve regurgitation is trivial. 14. Findings consistent with Tricuspid valve endocarditis. FINDINGS  Left Ventricle: Left ventricular ejection fraction, by visual estimation, is 60 to 65%. The left ventricle has normal function. The left ventricle has no regional wall motion abnormalities. There is no left ventricular hypertrophy. Right Ventricle: The right ventricular size is normal. No increase in right ventricular wall thickness. Global RV systolic function is has normal systolic function. Left Atrium: Left atrial size was normal in size. Right Atrium: Right atrial size was normal in size Pericardium: Trivial pericardial effusion is present. The pericardial effusion is circumferential. Mitral Valve: The mitral valve is grossly normal. Trivial mitral valve regurgitation. Tricuspid Valve: The tricuspid valve is abnormal. Tricuspid valve regurgitation mild-moderate. There is a moderately sized mobile tricuspid valve vegetation on the septal leaflet. The TV vegetation measures 15 mm x 5 mm. The flow in the hepatic veins is normal during ventricular systole. Tricuspid valve visualized with 2D/3D modes. Aortic Valve: The aortic valve is tricuspid. Aortic valve regurgitation is not visualized. Pulmonic Valve: The pulmonic valve was grossly normal. Pulmonic valve regurgitation is trivial. Aorta: The aortic root and ascending aorta are structurally normal, with no evidence of dilitation. Shunts: No atrial level shunt  detected by color flow Doppler.  Lyman Bishop MD Electronically signed by Lyman Bishop MD Signature Date/Time: 08/14/2019/1:11:51 PM    Final         Scheduled Meds:  enoxaparin (LOVENOX) injection  40 mg Subcutaneous Q24H   haloperidol  2 mg Oral QHS   mouth rinse  15 mL Mouth Rinse BID   potassium & sodium phosphates  2 packet Oral TID WC & HS   QUEtiapine  300 mg Oral QHS   Continuous Infusions:  sodium chloride      ceFAZolin (ANCEF) IV 2 g (08/16/19 0232)     LOS: 5 days    Time spent: 25 minutes    Barb Merino, MD [facility] Pager [phone]

## 2019-08-17 LAB — COMPREHENSIVE METABOLIC PANEL
ALT: 29 U/L (ref 0–44)
AST: 23 U/L (ref 15–41)
Albumin: 2.8 g/dL — ABNORMAL LOW (ref 3.5–5.0)
Alkaline Phosphatase: 84 U/L (ref 38–126)
Anion gap: 9 (ref 5–15)
BUN: 11 mg/dL (ref 6–20)
CO2: 23 mmol/L (ref 22–32)
Calcium: 8.7 mg/dL — ABNORMAL LOW (ref 8.9–10.3)
Chloride: 105 mmol/L (ref 98–111)
Creatinine, Ser: 0.59 mg/dL — ABNORMAL LOW (ref 0.61–1.24)
GFR calc Af Amer: >60 mL/min (ref >60)
GFR calc non Af Amer: >60 mL/min (ref >60)
Glucose, Bld: 120 mg/dL — ABNORMAL HIGH (ref 70–99)
Potassium: 4.4 mmol/L (ref 3.5–5.1)
Sodium: 137 mmol/L (ref 135–145)
Total Bilirubin: 0.8 mg/dL (ref 0.3–1.2)
Total Protein: 7.3 g/dL (ref 6.5–8.1)

## 2019-08-17 LAB — CBC
HCT: 35 % — ABNORMAL LOW (ref 39.0–52.0)
Hemoglobin: 10.9 g/dL — ABNORMAL LOW (ref 13.0–17.0)
MCH: 27.7 pg (ref 26.0–34.0)
MCHC: 31.1 g/dL (ref 30.0–36.0)
MCV: 88.8 fL (ref 80.0–100.0)
Platelets: 488 K/uL — ABNORMAL HIGH (ref 150–400)
RBC: 3.94 MIL/uL — ABNORMAL LOW (ref 4.22–5.81)
RDW: 14.6 % (ref 11.5–15.5)
WBC: 10.4 K/uL (ref 4.0–10.5)
nRBC: 0 % (ref 0.0–0.2)

## 2019-08-17 LAB — CULTURE, BLOOD (ROUTINE X 2): Special Requests: ADEQUATE

## 2019-08-17 LAB — MAGNESIUM: Magnesium: 2.2 mg/dL (ref 1.7–2.4)

## 2019-08-17 LAB — PHOSPHORUS: Phosphorus: 4.1 mg/dL (ref 2.5–4.6)

## 2019-08-17 MED ORDER — SODIUM CHLORIDE 0.9% FLUSH
10.0000 mL | INTRAVENOUS | Status: DC | PRN
  Administered 2019-08-18: 10 mL

## 2019-08-17 MED ORDER — SODIUM CHLORIDE 0.9% FLUSH
10.0000 mL | Freq: Two times a day (BID) | INTRAVENOUS | Status: DC
  Administered 2019-08-17 – 2019-08-18 (×2): 10 mL

## 2019-08-17 NOTE — Progress Notes (Signed)
Patient on his right side where he complained about numerous previous I. V.'s ,but refuses to lay on his back for me to evaluate the left arm or anything in either upper arm.

## 2019-08-17 NOTE — Progress Notes (Signed)
Pt alerted this nurse that his IV catheter was coming out - when assessed the saline lock was still intact, angiocath was visibly malpositioned, this nurse repositioned the cath, saline flushed without issues but no blood return. I left this in place and placed an order for the IV team consult. Advising pt someone else would come assess the site/ start new IV.

## 2019-08-17 NOTE — Progress Notes (Signed)
Midline catheter placed by IV nurse.

## 2019-08-17 NOTE — Progress Notes (Signed)
Pt refused his Lovenox- cursing at me when I threw it away stating I was wasting medicine as he only takes this every other day. Threw out IV tubing and he also c/o wasting stuff. While he was cursing - I left the room.

## 2019-08-17 NOTE — Progress Notes (Signed)
   Vital Signs MEWS/VS Documentation      08/16/2019 2024 08/16/2019 2111 08/17/2019 0556 08/17/2019 1355   MEWS Score:  0  1  0  2   MEWS Score Color:  Green  Green  Green  Yellow   Resp:  --  20  17  18    Pulse:  --  (!) 108  98  (!) 113   BP:  --  (!) 103/58  (!) 101/55  120/66   Temp:  --  99.3 F (37.4 C)  98.7 F (37.1 C)  98 F (36.7 C)   O2 Device:  --  Room [employer]  Room Air   Level of Consciousness:  Alert  --  --  --     Patient is agitated because he did not want vital signs taken. Advised NT that someone had already taken vitals. Nurse explained to patient the significance of vital signs. Patient reluctantly agreed. Nurse attempted to retake vital signs and reassess patient. Patient refused.     Venetia Maxon Apollonia Amini 08/17/2019,2:20 PM

## 2019-08-17 NOTE — Progress Notes (Signed)
Pt does not wish to take Haldol & Seroquel together as it is too sedating for him. Last night he only took Seroquel and states he did not sleep well& he was angry & moody with hangover effects. Tonight he only wants Haldol to see how that effects him.

## 2019-08-17 NOTE — Progress Notes (Signed)
PROGRESS NOTE    Justin Chaney  [id number] DOB: 1996-01-28 DOA: 08/10/2019 PCP: Patient, No Pcp Per    Brief Narrative:  24 year old gentleman with history of bipolar disorder, VP shunt, schizophrenia and IV drug use admitted to hospital with fever chills myalgia, frontal headache and shortness of breath of 1 week.  In the emergency room he was febrile with temperature 103, tachycardic 130, blood pressure low normal responded to fluid.  Lactic acidosis and platelets of 42.  Patient was empirically treated with antibiotics.  Blood cultures with MSSA and echocardiogram with endocarditis.   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  Drawn within 24 hours. Repeat blood cultures, 08/16/2019, negative so far. Clinically improving.  Sepsis resolving. Initially treated with broad-spectrum, now on Ancef. As per ID recommendation, Ancef until 09/08/2019 and a dose of dalbavancin before discharge.  Transient anemia thrombocytopenia: Due to sepsis.  Improved.  History of drug use: Patient has been using heroin recently.  No evidence of withdrawal.  Does not want any treatment.  Patient states that he can take care of himself.  Schizophrenia: Not taking medicine for a while.  Seen by psychiatry.  Started on Seroquel and Haldol at night.  Difficult iv access, mid line catheter placed today.  DVT prophylaxis: Lovenox  subcu Code Status: Full code Family Communication: None Disposition Plan: Remains in the hospital.  Not an outpatient infusion candidate due to active drug use.   Consultants:   Infectious disease  Psychiatry  Procedures:   None  Antimicrobials:  Anti-infectives (From admission, onward)   Start     Dose/Rate Route Frequency Ordered Stop   08/12/19 1800  ceFAZolin (ANCEF) IVPB 2g/100 mL premix     2 g 200 mL/hr over 30 Minutes Intravenous Every 8 hours 08/12/19 1251     08/11/19 1400  metroNIDAZOLE (FLAGYL) IVPB 500 mg  Status:  Discontinued     500 mg 100 mL/hr over 60 Minutes Intravenous Every 8 hours 08/11/19 0556 08/12/19 1229   08/11/19 1000  ceFEPIme (MAXIPIME) 2 g in sodium chloride 0.9 % 100 mL IVPB  Status:  Discontinued     2 g 200 mL/hr over 30 Minutes Intravenous Every 8 hours 08/11/19 0330 08/12/19 1229   08/11/19 0600  vancomycin (VANCOREADY) IVPB 1750 mg/350 mL  Status:  Discontinued     1,750 mg 175 mL/hr over 120 Minutes Intravenous Every 12 hours 08/11/19 0330 08/11/19 0335   08/11/19 0600  vancomycin (VANCOREADY) IVPB 1250 mg/250 mL  Status:  Discontinued     1,250 mg 166.7 mL/hr over 90 Minutes Intravenous Every 12 hours 08/11/19 0335 08/13/19 0928   08/11/19 0200  ceFEPIme (MAXIPIME) 2 g in sodium chloride 0.9 % 100 mL IVPB     2 g 200 mL/hr over 30 Minutes Intravenous  Once 08/11/19 0149 08/11/19 0257   08/11/19 0145  metroNIDAZOLE (FLAGYL) IVPB 500 mg     500 mg 100 mL/hr over 60 Minutes Intravenous  Once 08/11/19 0135 08/11/19 0623   08/11/19 0145  vancomycin (VANCOCIN) IVPB 1000 mg/200 mL premix     1,000 mg 200 mL/hr over 60 Minutes Intravenous  Once 08/11/19 0142 08/11/19 0509         Subjective: No overnight events.  He had some IV access issues.  Currently no complaints.  Objective: Vitals:   08/16/19 0536 08/16/19 1358 08/16/19 2111 08/17/19 0556  BP: 112/62 104/69 (!) 103/58 (!) 101/55  Pulse: 96 97 (!) 108 98  Resp: 19 16 20 17    Temp: 97.6 F (36.4 C) 98.5 F (36.9 C) 99.3 F (37.4 C) 98.7 F (37.1 C)  TempSrc: Oral Oral Oral Oral  SpO2: 96% 98% 96% 97%  Weight:      Height:        Intake/Output Summary (Last 24 hours) at 08/17/2019 1355 Last data filed at 08/17/2019 0600 Gross per 24 hour  Intake --  Output 100 ml  Net -100 ml   Filed Weights   08/12/19 0000  Weight: 103.4 kg    Examination:  General exam: Appears calm and comfortable  Respiratory system: Clear to auscultation. Respiratory effort normal. Cardiovascular system: S1 & S2 heard, RRR. No JVD, murmurs, rubs, gallops or clicks. No pedal edema. Gastrointestinal system: Abdomen is nondistended, soft and nontender. No organomegaly or masses felt. Normal bowel sounds heard. Central nervous system: Alert and oriented. No focal neurological deficits. Extremities: Symmetric 5 x 5 power. Skin: No rashes, lesions or ulcers Psychiatry: Judgement and insight appear normal. Mood & affect appropriate.     Data Reviewed: I have personally reviewed following labs and imaging studies  CBC: Recent Labs  Lab 08/11/19 0003 08/11/19 0003 08/11/19 0556 08/11/19 [id number] 08/13/19 0519 08/14/19 0452 08/15/19 0612 08/16/19 0458 08/17/19 0444  WBC 4.3   < > 9.0   < > 12.3* 12.0* 11.4* 10.1 10.4  NEUTROABS 3.6  --  7.7  --   --   --   --   --   --   HGB 11.7*   < > 14.5   < > 10.2* 10.3* 10.6* 10.3* 10.9*  HCT 35.9*   < > 45.3   < > 31.8* 32.3* 33.6* 33.5* 35.0*  MCV 84.3   < > 85.0   < > 84.8 85.9 86.6 88.2 88.8  PLT 42*   < > 121*   < > 154 217 232 393 488*   < > = values in this interval not displayed.   Basic Metabolic Panel: Recent Labs  Lab 08/13/19 0208 08/14/19 0452 08/15/19 0612 08/16/19 0458 08/17/19 0444  NA 138 139 135 137 137  K 3.4* 3.5 4.0 4.2 4.4  CL 106 106 104 106 105  CO2 22 24 22 25 23   GLUCOSE 112* 116* 120* 119* 120*  BUN 7 6 8 10 11   CREATININE 0.72 0.63 0.58* 0.59* 0.59*  CALCIUM 7.9* 8.3* 8.4* 8.3* 8.7*  MG 1.9  2.1 2.3 2.2 2.2  PHOS 2.9 3.5 3.2 4.2 4.1   GFR: Estimated Creatinine Clearance: 147.9 mL/min (A) (by C-G formula based on SCr of 0.59 mg/dL (L)). Liver Function Tests: Recent Labs  Lab 08/13/19 0208 08/14/19 0452 08/15/19 0612 08/16/19 0458 08/17/19 0444  AST 34 35 32 30 23  ALT 32 31 31 33 29  ALKPHOS 68 74 83 83 84  BILITOT 2.5* 1.7* 1.2 0.9 0.8  PROT 5.7* 6.6 7.2 7.0 7.3  ALBUMIN 2.5* 2.6* 2.8* 2.7* 2.8*   Recent Labs  Lab 08/11/19  0003  LIPASE 20   No results for input(s): AMMONIA in the last 168 hours. Coagulation Profile: Recent Labs  Lab 08/11/19 0647  INR 1.2   Cardiac Enzymes: Recent Labs  Lab 08/11/19 0800 08/12/19 0820  CKTOTAL 827* 471*   BNP (last 3 results) No results for input(s): PROBNP in the last 8760 hours. HbA1C: No results for input(s): HGBA1C in the last 72 hours. CBG: No results for input(s): GLUCAP in the last 168 hours. Lipid Profile: No results for input(s): CHOL, HDL, LDLCALC, TRIG, CHOLHDL, LDLDIRECT in the last 72 hours. Thyroid Function Tests: No results for input(s): TSH, T4TOTAL, FREET4, T3FREE, THYROIDAB in the last 72 hours. Anemia Panel: No results for input(s): VITAMINB12, FOLATE, FERRITIN, TIBC, IRON, RETICCTPCT in the last 72 hours. Sepsis Labs: Recent Labs  Lab 08/11/19 0409 08/11/19 0647 08/12/19 0932 08/12/19 1233  LATICACIDVEN 2.4* 2.4* 1.8 1.5    Recent Results (from the past 240 hour(s))  Blood culture (routine x 2)     Status: Abnormal   Collection Time: 08/11/19 12:03 AM   Specimen: BLOOD  Result Value Ref Range Status   Specimen Description   Final    BLOOD RIGHT ARM Performed at [facility] [address], [place], [place] [address]    Special Requests   Final    BOTTLES DRAWN AEROBIC AND ANAEROBIC Blood Culture adequate volume Performed at [facility] [address], [place], [place] [address]    Culture  Setup Time   Final    GRAM POSITIVE COCCI IN  CLUSTERS IN BOTH AEROBIC AND ANAEROBIC BOTTLES CRITICAL RESULT CALLED TO, READ BACK BY AND VERIFIED WITH: M. HICKS PHARMD, AT 1334 08/11/19 BY Rush Landmark Performed at [facility], [place] [address] [place], [place] [address]    Culture STAPHYLOCOCCUS AUREUS (A)  Final   Report Status 08/13/2019 FINAL  Final   Organism ID, Bacteria STAPHYLOCOCCUS AUREUS  Final      Susceptibility   Staphylococcus aureus - MIC*    CIPROFLOXACIN <=0.5 SENSITIVE Sensitive     ERYTHROMYCIN <=0.25 SENSITIVE Sensitive     GENTAMICIN <=0.5 SENSITIVE Sensitive     OXACILLIN 0.5 SENSITIVE Sensitive     TETRACYCLINE <=1 SENSITIVE Sensitive     VANCOMYCIN <=0.5 SENSITIVE Sensitive     TRIMETH/SULFA <=10 SENSITIVE Sensitive     CLINDAMYCIN <=0.25 SENSITIVE Sensitive     RIFAMPIN <=0.5 SENSITIVE Sensitive     Inducible Clindamycin NEGATIVE Sensitive     * STAPHYLOCOCCUS AUREUS  SARS CORONAVIRUS 2 (TAT 6-24 HRS) Nasopharyngeal Nasopharyngeal Swab     Status: None   Collection Time: 08/11/19  4:09 AM   Specimen: Nasopharyngeal Swab  Result Value Ref Range Status   SARS Coronavirus 2 NEGATIVE NEGATIVE Final    Comment: (NOTE) SARS-CoV-2 target nucleic acids are NOT DETECTED. The SARS-CoV-2 RNA is generally detectable in upper and lower respiratory specimens during the acute phase of infection. Negative results do not preclude SARS-CoV-2 infection, do not rule out co-infections with other pathogens, and should not be used as the sole basis for treatment or other patient management decisions. Negative results must be combined with clinical observations, patient history, and epidemiological information. The expected result is Negative. Fact Sheet for Patients: SugarRoll.be Fact Sheet for Healthcare Providers: https://www.woods-mathews.com/ This test is not yet approved or cleared by the [place] FDA and  has been authorized for detection and/or diagnosis of  SARS-CoV-2 by FDA under an Emergency Use Authorization (EUA). This EUA will  remain  in effect (meaning this test can be used) for the duration of the COVID-19 declaration under Section 56 4(b)(1) of the Act, 21 U.S.C. section 360bbb-3(b)(1), unless the authorization is terminated or revoked sooner. Performed at [facility], [place] [address], [place], [place] [address]   MRSA PCR Screening     Status: None   Collection Time: 08/11/19 11:00 PM   Specimen: Nasopharyngeal  Result Value Ref Range Status   MRSA by PCR NEGATIVE NEGATIVE Final    Comment:        The GeneXpert MRSA Assay (FDA approved for NASAL specimens only), is one component of a comprehensive MRSA colonization surveillance program. It is not intended to diagnose MRSA infection nor to guide or monitor treatment for MRSA infections. Performed at [facility], [place] [address], [place], [place] [address]   Culture, blood (routine x 2)     Status: Abnormal   Collection Time: 08/12/19 12:43 PM   Specimen: BLOOD LEFT ARM  Result Value Ref Range Status   Specimen Description   Final    BLOOD LEFT ARM Performed at [facility] [address] [place], [place] [address]    Special Requests   Final    BOTTLES DRAWN AEROBIC AND ANAEROBIC Blood Culture adequate volume Performed at [facility] [address] [place], [place] [address]    Culture  Setup Time   Final    ANAEROBIC BOTTLE ONLY GRAM POSITIVE COCCI CRITICAL VALUE NOTED.  VALUE IS CONSISTENT WITH PREVIOUSLY REPORTED AND CALLED VALUE.    Culture (A)  Final    STAPHYLOCOCCUS AUREUS SUSCEPTIBILITIES PERFORMED ON PREVIOUS CULTURE WITHIN THE LAST 5 DAYS. Performed at [facility], [place] [address], [place], [place] [address]    Report Status 08/17/2019 FINAL  Final  Culture, blood (routine x 2)     Status: None (Preliminary result)   Collection Time: 08/12/19 12:43 PM   Specimen: BLOOD  Result Value Ref  Range Status   Specimen Description   Final    BLOOD BLOOD LEFT HAND Performed at [facility] [address], [place], [place] [address]    Special Requests   Final    BOTTLES DRAWN AEROBIC ONLY Blood Culture adequate volume Performed at [facility] [address], [place], [place] [address]    Culture   Final    NO GROWTH 4 DAYS Performed at [facility], [place] [address] [place], [place] [address]    Report Status PENDING  Incomplete         Radiology Studies: No results found.      Scheduled Meds: . enoxaparin (LOVENOX) injection  40 mg Subcutaneous Q24H  . haloperidol  2 mg Oral QHS  . mouth rinse  15 mL Mouth Rinse BID  . potassium & sodium phosphates  2 packet Oral TID WC & HS  . QUEtiapine  300 mg Oral QHS  . sodium chloride flush  10-40 mL Intracatheter Q12H   Continuous Infusions: . sodium chloride    .  ceFAZolin (ANCEF) IV 2 g (08/17/19 1048)     LOS: 6 days    Time spent: 25 minutes    Barb Merino, MD [facility] Pager [phone]

## 2019-08-17 NOTE — Progress Notes (Signed)
When IV team arrived - saline lock had been removed by pt and he was asleep and unwilling to let the IV nurse start new IV. Pt became verbally abusive with staff stating he was not gonna change positions to allow IV nurse to do her job while being informed that was the only way he would get IV antibiotics.

## 2019-08-17 NOTE — Progress Notes (Signed)
   Vital Signs MEWS/VS Documentation      08/17/2019 0556 08/17/2019 1015 08/17/2019 1355 08/17/2019 1705   MEWS Score:  0  0  2  1   MEWS Score Color:  Green  Green  Yellow  Green   Resp:  17  --  18  18   Pulse:  98  --  (!) 113  (!) 107   BP:  (!) 101/55  --  120/66  (!) 123/59   Temp:  98.7 F (37.1 C)  --  98 F (36.7 C)  98.3 F (36.8 C)   O2 Device:  Room Air  --  Room Air  Room Air   Level of Consciousness:  --  Alert  --  --      Patient is alert, calm, and cooperative. Ambulating in hall. No signs of distress noted or voiced. States Seroquel makes him sleepy and mean to people. Patient stated he didn't like being mean to people.  Will continue to monitor.      Venetia Maxon Nakiah Osgood 08/17/2019,5:08 PM

## 2019-08-18 LAB — CBC
HCT: 39.6 % (ref 39.0–52.0)
Hemoglobin: 12.4 g/dL — ABNORMAL LOW (ref 13.0–17.0)
MCH: 27.6 pg (ref 26.0–34.0)
MCHC: 31.3 g/dL (ref 30.0–36.0)
MCV: 88 fL (ref 80.0–100.0)
Platelets: 525 10*3/uL — ABNORMAL HIGH (ref 150–400)
RBC: 4.5 MIL/uL (ref 4.22–5.81)
RDW: 14.6 % (ref 11.5–15.5)
WBC: 10.8 10*3/uL — ABNORMAL HIGH (ref 4.0–10.5)
nRBC: 0 % (ref 0.0–0.2)

## 2019-08-18 LAB — COMPREHENSIVE METABOLIC PANEL
ALT: 29 U/L (ref 0–44)
AST: 23 U/L (ref 15–41)
Albumin: 3.1 g/dL — ABNORMAL LOW (ref 3.5–5.0)
Alkaline Phosphatase: 92 U/L (ref 38–126)
Anion gap: 9 (ref 5–15)
BUN: 12 mg/dL (ref 6–20)
CO2: 24 mmol/L (ref 22–32)
Calcium: 9 mg/dL (ref 8.9–10.3)
Chloride: 102 mmol/L (ref 98–111)
Creatinine, Ser: 0.63 mg/dL (ref 0.61–1.24)
GFR calc Af Amer: >60 mL/min (ref >60)
GFR calc non Af Amer: >60 mL/min (ref >60)
Glucose, Bld: 148 mg/dL — ABNORMAL HIGH (ref 70–99)
Potassium: 4.1 mmol/L (ref 3.5–5.1)
Sodium: 135 mmol/L (ref 135–145)
Total Bilirubin: 0.9 mg/dL (ref 0.3–1.2)
Total Protein: 7.9 g/dL (ref 6.5–8.1)

## 2019-08-18 LAB — MAGNESIUM: Magnesium: 2 mg/dL (ref 1.7–2.4)

## 2019-08-18 LAB — PHOSPHORUS: Phosphorus: 3.6 mg/dL (ref 2.5–4.6)

## 2019-08-18 NOTE — Progress Notes (Signed)
PROGRESS NOTE  Justin Chaney [id number] DOB: 07/15/1996 DOA: 08/10/2019 PCP: Patient, No Pcp Per  HPI/Recap of past 93 hours: 24 year old gentleman with history of bipolar disorder, VP shunt, schizophrenia and IV drug use admitted to hospital with fever chills myalgia, frontal headache and shortness of breath of 1 week.  In the emergency room he was febrile with temperature 103, tachycardic 130, blood pressure low normal responded to fluid.  Lactic acidosis and platelets of 42.  Patient was empirically treated with antibiotics.  Blood cultures with MSSA and echocardiogram with endocarditis.  08/18/19: Seen and examined.  No new complaints.   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  Drawn within 24 hours. Repeat blood cultures, 08/16/2019, negative so far. Clinically improving.  Sepsis resolving. Initially treated with broad-spectrum, now on Ancef. As per ID recommendation, Ancef until 09/08/2019 and a dose of dalbavancin before discharge.  Transient anemia thrombocytopenia: Due to sepsis.  Improved.  History of drug use: Patient has been using heroin recently.  No evidence of withdrawal.  Does not want any treatment.  Patient states that he can take care of himself.  Schizophrenia: Not taking medicine for a while.  Seen by psychiatry.  Started on Seroquel and Haldol at night.  Difficult iv access, mid line  catheter placed today.  DVT prophylaxis: Lovenox subcu Code Status: Full code Family Communication: None Disposition Plan: Remains in the hospital.  Not an outpatient infusion candidate due to active drug use.   Consultants:   Infectious disease  Psychiatry  Procedures:   None  Antimicrobials:    Objective: Vitals:   08/17/19 1355 08/17/19 1705 08/17/19 2003 08/18/19 0452  BP: 120/66 (!) 123/59 122/68 120/72  Pulse: (!) 113 (!) 107 (!) 104 97  Resp: 18 18 18 20   Temp: 98 F (36.7 C) 98.3 F (36.8 C) 98.5 F (36.9 C) 98.6 F (37 C)  TempSrc: Oral Oral Oral Oral  SpO2: 97% 98% 100% 98%  Weight:      Height:        Intake/Output Summary (Last 24 hours) at 08/18/2019 1355 Last data filed at 08/17/2019 1800 Gross per 24 hour  Intake 600 ml  Output --  Net 600 ml   Filed Weights   08/12/19 0000  Weight: 103.4 kg    Exam:  . General: 24 y.o. year-old male well developed well nourished in no acute distress.  Alert and oriented x3. . Cardiovascular: Regular rate and rhythm with no rubs or gallops.  No thyromegaly or JVD noted.   Marland Kitchen Respiratory: Clear to auscultation with no wheezes or rales. Good inspiratory effort. . Abdomen: Soft nontender nondistended with normal bowel sounds x4 quadrants. . Musculoskeletal: No lower extremity edema. 2/4 pulses in all 4 extremities. Marland Kitchen Psychiatry: Mood is appropriate for condition and setting   Data Reviewed: CBC: Recent Labs  Lab 08/14/19 0452 08/15/19 0612 08/16/19 0458 08/17/19 0444 08/18/19 0858  WBC 12.0* 11.4* 10.1 10.4 10.8*  HGB 10.3* 10.6* 10.3* 10.9*  12.4*  HCT 32.3* 33.6* 33.5* 35.0* 39.6  MCV 85.9 86.6 88.2 88.8 88.0  PLT 217 232 393 488* [id number]*   Basic Metabolic Panel: Recent Labs  Lab 08/14/19 0452 08/15/19 0612 08/16/19 0458 08/17/19 0444 08/18/19 0858  NA 139 135 137 137 135  K 3.5 4.0 4.2 4.4 4.1  CL 106 104 106 105 102  CO2 24 22 25 23 24   GLUCOSE 116* 120* 119* 120* 148*  BUN 6 8 10  11 12   CREATININE 0.63 0.58* 0.59* 0.59* 0.63  CALCIUM 8.3* 8.4* 8.3* 8.7* 9.0  MG 2.1 2.3 2.2 2.2 2.0  PHOS 3.5 3.2 4.2 4.1 3.6   GFR: Estimated Creatinine Clearance: 147.9 mL/min (by C-G formula based on SCr of 0.63 mg/dL). Liver Function Tests: Recent Labs  Lab 08/14/19 0452 08/15/19 0612 08/16/19 0458 08/17/19 0444 08/18/19 0858  AST 35 32 30 23 23   ALT 31 31 33 29 29  ALKPHOS 74 83 83 84 92  BILITOT 1.7* 1.2 0.9 0.8 0.9  PROT 6.6 7.2 7.0 7.3 7.9  ALBUMIN 2.6* 2.8* 2.7* 2.8* 3.1*   No results for input(s): LIPASE, AMYLASE in the last 168 hours. No results for input(s): AMMONIA in the last 168 hours. Coagulation Profile: No results for input(s): INR, PROTIME in the last 168 hours. Cardiac Enzymes: Recent Labs  Lab 08/12/19 0820  CKTOTAL 471*   BNP (last 3 results) No results for input(s): PROBNP in the last 8760 hours. HbA1C: No results for input(s): HGBA1C in the last 72 hours. CBG: No results for input(s): GLUCAP in the last 168 hours. Lipid Profile: No results for input(s): CHOL, HDL, LDLCALC, TRIG, CHOLHDL, LDLDIRECT in the last 72 hours. Thyroid Function Tests: No results for input(s): TSH, T4TOTAL, FREET4, T3FREE, THYROIDAB in the last 72 hours. Anemia Panel: No results for input(s): VITAMINB12, FOLATE, FERRITIN, TIBC, IRON, RETICCTPCT in the last 72 hours. Urine analysis:    Component Value Date/Time   COLORURINE YELLOW 08/10/2019 1615   APPEARANCEUR CLEAR 08/10/2019 1615   LABSPEC 1.018 08/10/2019 1615   PHURINE 7.0 08/10/2019 1615   GLUCOSEU NEGATIVE 08/10/2019 1615   HGBUR SMALL (A) 08/10/2019 1615   BILIRUBINUR NEGATIVE 08/10/2019 1615   KETONESUR NEGATIVE 08/10/2019 1615   PROTEINUR NEGATIVE 08/10/2019 1615   UROBILINOGEN 2.0 (H) 04/01/2014 2200   NITRITE NEGATIVE 08/10/2019 1615   LEUKOCYTESUR NEGATIVE 08/10/2019 1615   Sepsis Labs: @LABRCNTIP (procalcitonin:4,lacticidven:4)  ) Recent Results (from the past 240 hour(s))  Blood culture  (routine x 2)     Status: Abnormal   Collection Time: 08/11/19 12:03 AM   Specimen: BLOOD  Result Value Ref Range Status   Specimen Description   Final    BLOOD RIGHT ARM Performed at [facility] [address] [place], [place] [address]    Special Requests   Final    BOTTLES DRAWN AEROBIC AND ANAEROBIC Blood Culture adequate volume Performed at [facility] [address] [place], [place] [address]    Culture  Setup Time   Final    GRAM POSITIVE COCCI IN CLUSTERS IN BOTH AEROBIC AND ANAEROBIC BOTTLES CRITICAL RESULT CALLED TO, READ BACK BY AND VERIFIED WITH: M. HICKS PHARMD, AT 1334 08/11/19 BY Rush Landmark Performed at [facility], [place] [address] [place], [place] [address]    Culture STAPHYLOCOCCUS AUREUS (A)  Final   Report Status 08/13/2019 FINAL  Final   Organism ID, Bacteria STAPHYLOCOCCUS AUREUS  Final      Susceptibility   Staphylococcus  aureus - MIC*    CIPROFLOXACIN <=0.5 SENSITIVE Sensitive     ERYTHROMYCIN <=0.25 SENSITIVE Sensitive     GENTAMICIN <=0.5 SENSITIVE Sensitive     OXACILLIN 0.5 SENSITIVE Sensitive     TETRACYCLINE <=1 SENSITIVE Sensitive     VANCOMYCIN <=0.5 SENSITIVE Sensitive     TRIMETH/SULFA <=10 SENSITIVE Sensitive     CLINDAMYCIN <=0.25 SENSITIVE Sensitive     RIFAMPIN <=0.5 SENSITIVE Sensitive     Inducible Clindamycin NEGATIVE Sensitive     * STAPHYLOCOCCUS AUREUS  SARS CORONAVIRUS 2 (TAT 6-24 HRS) Nasopharyngeal Nasopharyngeal Swab     Status: None   Collection Time: 08/11/19  4:09 AM   Specimen: Nasopharyngeal Swab  Result Value Ref Range Status   SARS Coronavirus 2 NEGATIVE NEGATIVE Final    Comment: (NOTE) SARS-CoV-2 target nucleic acids are NOT DETECTED. The SARS-CoV-2 RNA is generally detectable in upper and lower respiratory specimens during the acute phase of infection. Negative results do not preclude SARS-CoV-2 infection, do not rule out co-infections with other pathogens, and should  not be used as the sole basis for treatment or other patient management decisions. Negative results must be combined with clinical observations, patient history, and epidemiological information. The expected result is Negative. Fact Sheet for Patients: SugarRoll.be Fact Sheet for Healthcare Providers: https://www.woods-mathews.com/ This test is not yet approved or cleared by the [place] FDA and  has been authorized for detection and/or diagnosis of SARS-CoV-2 by FDA under an Emergency Use Authorization (EUA). This EUA will remain  in effect (meaning this test can be used) for the duration of the COVID-19 declaration under Section 56 4(b)(1) of the Act, 21 U.S.C. section 360bbb-3(b)(1), unless the authorization is terminated or revoked sooner. Performed at [facility], [place] [address] [place], [place] [address]   MRSA PCR Screening     Status: None   Collection Time: 08/11/19 11:00 PM   Specimen: Nasopharyngeal  Result Value Ref Range Status   MRSA by PCR NEGATIVE NEGATIVE Final    Comment:        The GeneXpert MRSA Assay (FDA approved for NASAL specimens only), is one component of a comprehensive MRSA colonization surveillance program. It is not intended to diagnose MRSA infection nor to guide or monitor treatment for MRSA infections. Performed at [name], [place] [address], [place], [place] [address]   Culture, blood (routine x 2)     Status: Abnormal   Collection Time: 08/12/19 12:43 PM   Specimen: BLOOD LEFT ARM  Result Value Ref Range Status   Specimen Description   Final    BLOOD LEFT ARM Performed at [facility] [address] [place], [place] [address]    Special Requests   Final    BOTTLES DRAWN AEROBIC AND ANAEROBIC Blood Culture adequate volume Performed at [facility] [address] [place], [place] [address]    Culture  Setup Time   Final    ANAEROBIC  BOTTLE ONLY GRAM POSITIVE COCCI CRITICAL VALUE NOTED.  VALUE IS CONSISTENT WITH PREVIOUSLY REPORTED AND CALLED VALUE.    Culture (A)  Final    STAPHYLOCOCCUS AUREUS SUSCEPTIBILITIES PERFORMED ON PREVIOUS CULTURE WITHIN THE LAST 5 DAYS. Performed at [facility], [place] [address], [place],  [address]    Report Status 08/17/2019 FINAL  Final  Culture, blood (routine x 2)     Status: None (Preliminary result)   Collection Time: 08/12/19 12:43 PM   Specimen: BLOOD  Result Value  Ref Range Status   Specimen Description   Final    BLOOD BLOOD LEFT HAND Performed at [facility] [address], [place], [place] [address]    Special Requests   Final    BOTTLES DRAWN AEROBIC ONLY Blood Culture adequate volume Performed at [facility] [address] [place], [place] [address]    Culture   Final    NO GROWTH 4 DAYS Performed at [facility], [place] [address], [place], [place] [address]    Report Status PENDING  Incomplete  Culture, blood (routine x 2)     Status: None (Preliminary result)   Collection Time: 08/16/19  9:19 AM   Specimen: BLOOD RIGHT HAND  Result Value Ref Range Status   Specimen Description   Final    BLOOD RIGHT HAND Performed at [facility] [address], [place], [place] [address]    Special Requests   Final    BOTTLES DRAWN AEROBIC AND ANAEROBIC Blood Culture adequate volume Performed at [facility] [address], [place], [place] [address]    Culture   Final    NO GROWTH 2 DAYS Performed at [facility] [address], [place], [place] [address]    Report Status PENDING  Incomplete      Studies: No results found.  Scheduled Meds: . enoxaparin (LOVENOX) injection  40 mg Subcutaneous Q24H  . haloperidol  2 mg Oral QHS  . mouth rinse  15 mL Mouth Rinse BID  . potassium & sodium phosphates  2 packet Oral TID WC & HS  . QUEtiapine  300 mg Oral QHS  .  sodium chloride flush  10-40 mL Intracatheter Q12H    Continuous Infusions: . sodium chloride Stopped (08/17/19 2149)  .  ceFAZolin (ANCEF) IV 2 g (08/18/19 1031)     LOS: 7 days     Kayleen Memos, MD [facility] Pager [phone]  If 7PM-7AM, please contact night-coverage [email or web address] Password [email or web address] 08/18/2019, 1:55 PM

## 2019-08-19 LAB — CULTURE, BLOOD (ROUTINE X 2)
Culture: NO GROWTH
Special Requests: ADEQUATE

## 2019-08-19 MED ORDER — POTASSIUM & SODIUM PHOSPHATES 280-160-250 MG PO PACK
1.0000 | PACK | Freq: Three times a day (TID) | ORAL | Status: DC
  Administered 2019-08-19 – 2019-08-27 (×31): 1 via ORAL
  Filled 2019-08-19 (×41): qty 1

## 2019-08-19 NOTE — Progress Notes (Signed)
PROGRESS NOTE  Justin Chaney [id number] DOB: 12/07/1995 DOA: 08/10/2019 PCP: Patient, No Pcp Per  HPI/Recap of past 29 hours: 24 year old gentleman with history of bipolar disorder, VP shunt, schizophrenia and IV drug use admitted to hospital with fever chills myalgia, frontal headache and shortness of breath of 1 week.  In the emergency room he was febrile with temperature 103, tachycardic 130, blood pressure low normal responded to fluid.  Lactic acidosis and platelets of 42.  Patient was empirically treated with antibiotics.  Blood cultures with MSSA and echocardiogram with endocarditis.  08/19/19: Seen and examined.  No acute events overnight.  On IV antibiotics for MSSA bacteremia in the setting of IV drug use.   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  Drawn within 24 hours. Repeat blood cultures, 08/16/2019, negative so far. Clinically improving.  Sepsis resolving. Initially treated with broad-spectrum, now on Ancef. As per ID recommendation, Ancef until 09/08/2019 and a dose of dalbavancin before discharge.  Transient anemia thrombocytopenia: Due to sepsis.  Improved.  History of drug use: Patient has been using heroin recently.  No evidence of withdrawal.  Does not want any treatment.  Patient states that he can take care of himself.  Schizophrenia: Not taking medicine for a while.  Seen by psychiatry.   Started on Seroquel and Haldol at night.  Difficult iv access, mid line catheter placed today.  DVT prophylaxis: Lovenox subcu daily. Code Status: Full code Family Communication: None Disposition Plan: Remains in the hospital.  Not an outpatient infusion candidate due to active drug use.   Consultants:   Infectious disease  Psychiatry  Procedures:   None  Antimicrobials:    Objective: Vitals:   08/18/19 1510 08/18/19 2035 08/19/19 0557 08/19/19 1320  BP: (!) 113/47 126/86 104/63 112/67  Pulse: (!) 104 (!) 102 96 (!) 105  Resp:  18 18 18   Temp:  (!) 97.5 F (36.4 C) 98.1 F (36.7 C) 97.7 F (36.5 C)  TempSrc:  Oral Oral Oral  SpO2:  100% 98% 98%  Weight:      Height:       No intake or output data in the 24 hours ending 08/19/19 1522 Filed Weights   08/12/19 0000  Weight: 103.4 kg    Exam:  . General: 24 y.o. year-old male well-developed and nourished no acute distress.  Alert and oriented x3.  .  Cardiovascular: Regular rate and rhythm no rub or gallop.   Marland Kitchen Respiratory: Clear to auscultation with no wheezes or rales.  Good inspiratory effort. . Abdomen: Soft nontender normal bowel sounds present.   . Musculoskeletal: No lower extremity edema bilaterally. Psychiatry: Mood is appropriate for condition and setting.  Data Reviewed: CBC: Recent Labs  Lab 08/14/19 0452 08/15/19 0612 08/16/19 0458 08/17/19 0444 08/18/19 0858  WBC 12.0* 11.4* 10.1 10.4 10.8*  HGB 10.3* 10.6* 10.3* 10.9* 12.4*  HCT 32.3* 33.6* 33.5* 35.0* 39.6  MCV 85.9 86.6 88.2 88.8 88.0  PLT 217 232 393  488* [id number]*   Basic Metabolic Panel: Recent Labs  Lab 08/14/19 0452 08/15/19 0612 08/16/19 0458 08/17/19 0444 08/18/19 0858  NA 139 135 137 137 135  K 3.5 4.0 4.2 4.4 4.1  CL 106 104 106 105 102  CO2 24 22 25 23 24   GLUCOSE 116* 120* 119* 120* 148*  BUN 6 8 10 11 12   CREATININE 0.63 0.58* 0.59* 0.59* 0.63  CALCIUM 8.3* 8.4* 8.3* 8.7* 9.0  MG 2.1 2.3 2.2 2.2 2.0  PHOS  3.5 3.2 4.2 4.1 3.6   GFR: Estimated Creatinine Clearance: 147.9 mL/min (by C-G formula based on SCr of 0.63 mg/dL). Liver Function Tests: Recent Labs  Lab 08/14/19 0452 08/15/19 0612 08/16/19 0458 08/17/19 0444 08/18/19 0858  AST 35 32 30 23 23   ALT 31 31 33 29 29  ALKPHOS 74 83 83 84 92  BILITOT 1.7* 1.2 0.9 0.8 0.9  PROT 6.6 7.2 7.0 7.3 7.9  ALBUMIN 2.6* 2.8* 2.7* 2.8* 3.1*   No results for input(s): LIPASE, AMYLASE in the last 168 hours. No results for input(s): AMMONIA in the last 168 hours. Coagulation Profile: No results for input(s): INR, PROTIME in the last 168 hours. Cardiac Enzymes: No results for input(s): CKTOTAL, CKMB, CKMBINDEX, TROPONINI in the last 168 hours. BNP (last 3 results) No results for input(s): PROBNP in the last 8760 hours. HbA1C: No results for input(s): HGBA1C in the last 72 hours. CBG: No results for input(s): GLUCAP in the last 168 hours. Lipid Profile: No results for input(s): CHOL, HDL, LDLCALC, TRIG, CHOLHDL, LDLDIRECT in the last 72 hours. Thyroid Function Tests: No results for input(s): TSH, T4TOTAL, FREET4, T3FREE, THYROIDAB in the last 72 hours. Anemia Panel: No results for input(s): VITAMINB12, FOLATE, FERRITIN, TIBC, IRON, RETICCTPCT in the last 72 hours. Urine analysis:    Component Value Date/Time   COLORURINE YELLOW 08/10/2019 1615   APPEARANCEUR CLEAR 08/10/2019 1615   LABSPEC 1.018 08/10/2019 1615   PHURINE 7.0 08/10/2019 1615   GLUCOSEU NEGATIVE 08/10/2019 1615   HGBUR SMALL (A) 08/10/2019 1615   BILIRUBINUR NEGATIVE 08/10/2019 1615   KETONESUR NEGATIVE 08/10/2019 1615   PROTEINUR NEGATIVE 08/10/2019 1615   UROBILINOGEN 2.0 (H) 04/01/2014 2200   NITRITE NEGATIVE 08/10/2019 1615   LEUKOCYTESUR NEGATIVE 08/10/2019 1615   Sepsis Labs: @LABRCNTIP (procalcitonin:4,lacticidven:4)  ) Recent Results (from the past 240 hour(s))  Blood culture (routine x 2)     Status: Abnormal   Collection Time: 08/11/19 12:03 AM    Specimen: BLOOD  Result Value Ref Range Status   Specimen Description   Final    BLOOD RIGHT ARM Performed at [facility] [address], [place], [place] [address]    Special Requests   Final    BOTTLES DRAWN AEROBIC AND ANAEROBIC Blood Culture adequate volume Performed at [facility] [address] [place], [place] [address]    Culture  Setup Time   Final    GRAM POSITIVE COCCI IN CLUSTERS IN BOTH AEROBIC AND ANAEROBIC BOTTLES CRITICAL RESULT CALLED TO, READ BACK BY AND VERIFIED WITH: M. HICKS PHARMD, AT 1334 08/11/19 BY Rush Landmark Performed at [facility], [place] [address], [place],  [address]    Culture STAPHYLOCOCCUS AUREUS (A)  Final   Report Status 08/13/2019 FINAL  Final   Organism ID, Bacteria STAPHYLOCOCCUS AUREUS  Final      Susceptibility   Staphylococcus aureus - MIC*    CIPROFLOXACIN <=0.5 SENSITIVE Sensitive     ERYTHROMYCIN <=0.25 SENSITIVE Sensitive  GENTAMICIN <=0.5 SENSITIVE Sensitive     OXACILLIN 0.5 SENSITIVE Sensitive     TETRACYCLINE <=1 SENSITIVE Sensitive     VANCOMYCIN <=0.5 SENSITIVE Sensitive     TRIMETH/SULFA <=10 SENSITIVE Sensitive     CLINDAMYCIN <=0.25 SENSITIVE Sensitive     RIFAMPIN <=0.5 SENSITIVE Sensitive     Inducible Clindamycin NEGATIVE Sensitive     * STAPHYLOCOCCUS AUREUS  SARS CORONAVIRUS 2 (TAT 6-24 HRS) Nasopharyngeal Nasopharyngeal Swab     Status: None   Collection Time: 08/11/19  4:09 AM   Specimen: Nasopharyngeal Swab  Result Value Ref Range Status   SARS Coronavirus 2 NEGATIVE NEGATIVE Final    Comment: (NOTE) SARS-CoV-2 target nucleic acids are NOT DETECTED. The SARS-CoV-2 RNA is generally detectable in upper and lower respiratory specimens during the acute phase of infection. Negative results do not preclude SARS-CoV-2 infection, do not rule out co-infections with other pathogens, and should not be used as the sole basis for treatment or other patient management  decisions. Negative results must be combined with clinical observations, patient history, and epidemiological information. The expected result is Negative. Fact Sheet for Patients: SugarRoll.be Fact Sheet for Healthcare Providers: https://www.woods-mathews.com/ This test is not yet approved or cleared by the [place] FDA and  has been authorized for detection and/or diagnosis of SARS-CoV-2 by FDA under an Emergency Use Authorization (EUA). This EUA will remain  in effect (meaning this test can be used) for the duration of the COVID-19 declaration under Section 56 4(b)(1) of the Act, 21 U.S.C. section 360bbb-3(b)(1), unless the authorization is terminated or revoked sooner. Performed at [facility], [place] [address] [place], [place] [address]   MRSA PCR Screening     Status: None   Collection Time: 08/11/19 11:00 PM   Specimen: Nasopharyngeal  Result Value Ref Range Status   MRSA by PCR NEGATIVE NEGATIVE Final    Comment:        The GeneXpert MRSA Assay (FDA approved for NASAL specimens only), is one component of a comprehensive MRSA colonization surveillance program. It is not intended to diagnose MRSA infection nor to guide or monitor treatment for MRSA infections. Performed at [facility], [place] [address], [place], [place] [address]   Culture, blood (routine x 2)     Status: Abnormal   Collection Time: 08/12/19 12:43 PM   Specimen: BLOOD LEFT ARM  Result Value Ref Range Status   Specimen Description   Final    BLOOD LEFT ARM Performed at [facility] [address], [place], [place] [address]    Special Requests   Final    BOTTLES DRAWN AEROBIC AND ANAEROBIC Blood Culture adequate volume Performed at [facility] [address], [place], [place] [address]    Culture  Setup Time   Final    ANAEROBIC BOTTLE ONLY GRAM POSITIVE COCCI CRITICAL VALUE NOTED.  VALUE IS  CONSISTENT WITH PREVIOUSLY REPORTED AND CALLED VALUE.    Culture (A)  Final    STAPHYLOCOCCUS AUREUS SUSCEPTIBILITIES PERFORMED ON PREVIOUS CULTURE WITHIN THE LAST 5 DAYS. Performed at [facility], [place] [address], [place], [place] [address]    Report Status 08/17/2019 FINAL  Final  Culture, blood (routine x 2)     Status: None (Preliminary result)   Collection Time: 08/12/19 12:43 PM   Specimen: BLOOD  Result Value Ref Range Status   Specimen Description   Final    BLOOD BLOOD LEFT HAND Performed at [facility], [place] [address] [place], [place] [address]    Special Requests   Final    BOTTLES DRAWN AEROBIC ONLY Blood Culture adequate volume Performed at [facility] [address], [place], [place] [address]    Culture   Final    NO GROWTH 4 DAYS Performed at [facility], [place] [address], [place], [place] [address]    Report Status PENDING  Incomplete  Culture, blood (routine x 2)     Status: None (Preliminary result)   Collection Time: 08/16/19  9:19 AM   Specimen: BLOOD RIGHT HAND  Result Value Ref Range Status   Specimen Description   Final    BLOOD RIGHT HAND Performed at [facility] [address], [place], [place] [address]    Special Requests   Final    BOTTLES DRAWN AEROBIC AND ANAEROBIC Blood Culture adequate volume Performed at [facility] [address], [place], [place] [address]    Culture   Final    NO GROWTH 3 DAYS Performed at [facility], [place] [address], [place], [place] [address]    Report Status PENDING  Incomplete      Studies: No results found.  Scheduled Meds: . enoxaparin (LOVENOX) injection  40 mg Subcutaneous Q24H  . haloperidol  2 mg Oral QHS  . mouth rinse  15 mL Mouth Rinse BID  . potassium & sodium phosphates  1 packet Oral TID WC & HS  . QUEtiapine  300 mg Oral QHS    Continuous Infusions: . sodium chloride Stopped (08/17/19 2149)   .  ceFAZolin (ANCEF) IV 2 g (08/19/19 1024)     LOS: 8 days     Kayleen Memos, MD [facility] Pager [phone]  If 7PM-7AM, please contact night-coverage [email or web address] Password [facility] 08/19/2019, 3:22 PM

## 2019-08-20 MED ORDER — SENNOSIDES-DOCUSATE SODIUM 8.6-50 MG PO TABS
2.0000 | ORAL_TABLET | Freq: Two times a day (BID) | ORAL | Status: DC
  Administered 2019-08-20 – 2019-09-02 (×23): 2 via ORAL
  Filled 2019-08-20 (×34): qty 2

## 2019-08-20 NOTE — Progress Notes (Signed)
PROGRESS NOTE  Justin Chaney [id number] DOB: Mar 13, 1996 DOA: 08/10/2019 PCP: Patient, No Pcp Per  HPI/Recap of past 76 hours: 24 year old gentleman with history of bipolar disorder, VP shunt, schizophrenia and IV drug use admitted to hospital with fever chills myalgia, frontal headache and shortness of breath of 1 week.  In the emergency room he was febrile with temperature 103, tachycardic 130, blood pressure low normal responded to fluid.  Lactic acidosis and platelets of 42.  Patient was empirically treated with antibiotics.  Blood cultures with MSSA and echocardiogram with endocarditis.  08/20/19: Patient was seen in his room.  No acute events overnight.  He has no new complaints.  He is on IV antibiotics for MSSA bacteremia in the setting of IV drug abuse.    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  Drawn within 24 hours. Repeat blood cultures, 08/16/2019, negative so far. Clinically improving.  Sepsis resolving. Initially treated with broad-spectrum, now on Ancef. As per ID recommendation, Ancef until 09/08/2019 and a dose of dalbavancin before discharge.  Transient anemia thrombocytopenia: Due to sepsis.  Improved.  History of drug use: Patient has been using heroin recently.  No evidence of withdrawal.  Does not want any treatment.  Patient states that he can take care of himself.  Schizophrenia: Not taking  medicine for a while.  Seen by psychiatry.  Started on Seroquel and Haldol at night.  Difficult iv access, mid line catheter placed today.  DVT prophylaxis: Lovenox subcu daily. Code Status: Full code Family Communication: None Disposition Plan: Remains in the hospital.  Not an outpatient infusion candidate due to active drug use.   Consultants:   Infectious disease  Psychiatry  Procedures:   None  Antimicrobials:    Objective: Vitals:   08/19/19 0557 08/19/19 1320 08/19/19 2013 08/20/19 0555  BP: 104/63 112/67 113/73 104/67  Pulse: 96 (!) 105 (!) 108 92  Resp: 18 18 19 18   Temp: 98.1 F (36.7 C) 97.7 F (36.5 C) 98.3 F (36.8 C) 98.4 F (36.9 C)  TempSrc: Oral Oral Oral Oral  SpO2: 98% 98% 99% 97%  Weight:      Height:        Intake/Output Summary (Last 24 hours) at 08/20/2019 1240 Last data filed at 08/19/2019 2200 Gross per 24 hour  Intake 480 ml  Output --  Net 480 ml   Filed Weights   08/12/19 0000  Weight: 103.4 kg    Exam: No change from previous exam done on 08/19/2019.  Marland Kitchen General: 24 y.o. year-old male well-developed and nourished no acute distress.  Alert and oriented x3.  .  Cardiovascular: Regular rate and rhythm no rub or gallop.   Marland Kitchen Respiratory: Clear to auscultation with no wheezes or rales.  Good inspiratory effort. . Abdomen: Soft nontender normal bowel sounds present.   . Musculoskeletal: No lower extremity edema bilaterally. Psychiatry: Mood is appropriate for condition and setting.  Data Reviewed: CBC: Recent Labs  Lab 08/14/19 0452 08/15/19  [id number] 08/16/19 0458 08/17/19 0444 08/18/19 0858  WBC 12.0* 11.4* 10.1 10.4 10.8*  HGB 10.3* 10.6* 10.3* 10.9* 12.4*  HCT 32.3* 33.6* 33.5* 35.0* 39.6  MCV 85.9 86.6 88.2 88.8 88.0  PLT 217 232 393 488* [id number]*   Basic Metabolic Panel: Recent Labs  Lab 08/14/19 0452 08/15/19 0612 08/16/19 0458 08/17/19 0444 08/18/19 0858  NA 139 135 137 137 135  K 3.5 4.0 4.2 4.4 4.1  CL 106  104 106 105 102  CO2 24 22 25 23 24   GLUCOSE 116* 120* 119* 120* 148*  BUN 6 8 10 11 12   CREATININE 0.63 0.58* 0.59* 0.59* 0.63  CALCIUM 8.3* 8.4* 8.3* 8.7* 9.0  MG 2.1 2.3 2.2 2.2 2.0  PHOS 3.5 3.2 4.2 4.1 3.6   GFR: Estimated Creatinine Clearance: 147.9 mL/min (by C-G formula based on SCr of 0.63 mg/dL). Liver Function Tests: Recent Labs  Lab 08/14/19 0452 08/15/19 0612 08/16/19 0458 08/17/19 0444 08/18/19 0858  AST 35 32 30 23 23   ALT 31 31 33 29 29  ALKPHOS 74 83 83 84 92  BILITOT 1.7* 1.2 0.9 0.8 0.9  PROT 6.6 7.2 7.0 7.3 7.9  ALBUMIN 2.6* 2.8* 2.7* 2.8* 3.1*   No results for input(s): LIPASE, AMYLASE in the last 168 hours. No results for input(s): AMMONIA in the last 168 hours. Coagulation Profile: No results for input(s): INR, PROTIME in the last 168 hours. Cardiac Enzymes: No results for input(s): CKTOTAL, CKMB, CKMBINDEX, TROPONINI in the last 168 hours. BNP (last 3 results) No results for input(s): PROBNP in the last 8760 hours. HbA1C: No results for input(s): HGBA1C in the last 72 hours. CBG: No results for input(s): GLUCAP in the last 168 hours. Lipid Profile: No results for input(s): CHOL, HDL, LDLCALC, TRIG, CHOLHDL, LDLDIRECT in the last 72 hours. Thyroid Function Tests: No results for input(s): TSH, T4TOTAL, FREET4, T3FREE, THYROIDAB in the last 72 hours. Anemia Panel: No results for input(s): VITAMINB12, FOLATE, FERRITIN, TIBC, IRON, RETICCTPCT in the last 72 hours. Urine analysis:    Component Value Date/Time   COLORURINE YELLOW 08/10/2019 1615   APPEARANCEUR CLEAR 08/10/2019 1615   LABSPEC 1.018 08/10/2019 1615   PHURINE 7.0 08/10/2019 1615   GLUCOSEU NEGATIVE 08/10/2019 1615   HGBUR SMALL (A) 08/10/2019 1615   BILIRUBINUR NEGATIVE 08/10/2019 1615   KETONESUR NEGATIVE 08/10/2019 1615   PROTEINUR NEGATIVE 08/10/2019 1615   UROBILINOGEN 2.0 (H) 04/01/2014 2200   NITRITE NEGATIVE 08/10/2019 1615   LEUKOCYTESUR NEGATIVE 08/10/2019 1615    Sepsis Labs: @LABRCNTIP (procalcitonin:4,lacticidven:4)  ) Recent Results (from the past 240 hour(s))  Blood culture (routine x 2)     Status: Abnormal   Collection Time: 08/11/19 12:03 AM   Specimen: BLOOD  Result Value Ref Range Status   Specimen Description   Final    BLOOD RIGHT ARM Performed at [facility] [address], [place], [place] [address]    Special Requests   Final    BOTTLES DRAWN AEROBIC AND ANAEROBIC Blood Culture adequate volume Performed at [facility] [address] [place], [place] [address]    Culture  Setup Time   Final    GRAM POSITIVE COCCI IN CLUSTERS IN BOTH AEROBIC AND ANAEROBIC BOTTLES CRITICAL RESULT CALLED TO, READ BACK BY AND VERIFIED WITH: M. HICKS PHARMD, AT 1334 08/11/19 BY Rush Landmark Performed at [facility], [place] [address], [place], [place] [address]    Culture STAPHYLOCOCCUS AUREUS (A)  Final   Report Status  08/13/2019 FINAL  Final   Organism ID, Bacteria STAPHYLOCOCCUS AUREUS  Final      Susceptibility   Staphylococcus aureus - MIC*    CIPROFLOXACIN <=0.5 SENSITIVE Sensitive     ERYTHROMYCIN <=0.25 SENSITIVE Sensitive     GENTAMICIN <=0.5 SENSITIVE Sensitive     OXACILLIN 0.5 SENSITIVE Sensitive     TETRACYCLINE <=1 SENSITIVE Sensitive     VANCOMYCIN <=0.5 SENSITIVE Sensitive     TRIMETH/SULFA <=10 SENSITIVE Sensitive     CLINDAMYCIN <=0.25 SENSITIVE Sensitive     RIFAMPIN <=0.5 SENSITIVE Sensitive     Inducible Clindamycin NEGATIVE Sensitive     * STAPHYLOCOCCUS AUREUS  SARS CORONAVIRUS 2 (TAT 6-24 HRS) Nasopharyngeal Nasopharyngeal Swab     Status: None   Collection Time: 08/11/19  4:09 AM   Specimen: Nasopharyngeal Swab  Result Value Ref Range Status   SARS Coronavirus 2 NEGATIVE NEGATIVE Final    Comment: (NOTE) SARS-CoV-2 target nucleic acids are NOT DETECTED. The SARS-CoV-2 RNA is generally detectable in upper and lower respiratory specimens during the acute phase of  infection. Negative results do not preclude SARS-CoV-2 infection, do not rule out co-infections with other pathogens, and should not be used as the sole basis for treatment or other patient management decisions. Negative results must be combined with clinical observations, patient history, and epidemiological information. The expected result is Negative. Fact Sheet for Patients: SugarRoll.be Fact Sheet for Healthcare Providers: https://www.woods-mathews.com/ This test is not yet approved or cleared by the [place] FDA and  has been authorized for detection and/or diagnosis of SARS-CoV-2 by FDA under an Emergency Use Authorization (EUA). This EUA will remain  in effect (meaning this test can be used) for the duration of the COVID-19 declaration under Section 56 4(b)(1) of the Act, 21 U.S.C. section 360bbb-3(b)(1), unless the authorization is terminated or revoked sooner. Performed at [name], [place] [address] [place], [place] [address]   MRSA PCR Screening     Status: None   Collection Time: 08/11/19 11:00 PM   Specimen: Nasopharyngeal  Result Value Ref Range Status   MRSA by PCR NEGATIVE NEGATIVE Final    Comment:        The GeneXpert MRSA Assay (FDA approved for NASAL specimens only), is one component of a comprehensive MRSA colonization surveillance program. It is not intended to diagnose MRSA infection nor to guide or monitor treatment for MRSA infections. Performed at [facility], [place] [address] [place], [place] [address]   Culture, blood (routine x 2)     Status: Abnormal   Collection Time: 08/12/19 12:43 PM   Specimen: BLOOD LEFT ARM  Result Value Ref Range Status   Specimen Description   Final    BLOOD LEFT ARM Performed at [facility] [address], [place], [place] [address]    Special Requests   Final    BOTTLES DRAWN AEROBIC AND ANAEROBIC Blood Culture adequate  volume Performed at [facility] [address] [place], [place] [address]    Culture  Setup Time   Final    ANAEROBIC BOTTLE ONLY GRAM POSITIVE COCCI CRITICAL VALUE NOTED.  VALUE IS CONSISTENT WITH PREVIOUSLY REPORTED AND CALLED VALUE.    Culture (A)  Final    STAPHYLOCOCCUS AUREUS SUSCEPTIBILITIES PERFORMED ON PREVIOUS CULTURE WITHIN THE LAST 5 DAYS. Performed at [facility], [place] [address], [place], [place] [address]    Report Status 08/17/2019 FINAL  Final  Culture, blood (routine x 2)  Status: None   Collection Time: 08/12/19 12:43 PM   Specimen: BLOOD  Result Value Ref Range Status   Specimen Description BLOOD BLOOD LEFT HAND  Final   Special Requests   Final    BOTTLES DRAWN AEROBIC ONLY Blood Culture adequate volume Performed at [facility] [address] [place], [place] [address]    Culture NO GROWTH 7 DAYS  Final   Report Status 08/19/2019 FINAL  Final  Culture, blood (routine x 2)     Status: None (Preliminary result)   Collection Time: 08/16/19  9:19 AM   Specimen: BLOOD RIGHT HAND  Result Value Ref Range Status   Specimen Description   Final    BLOOD RIGHT HAND Performed at [facility] [address] [place], [place] [address]    Special Requests   Final    BOTTLES DRAWN AEROBIC AND ANAEROBIC Blood Culture adequate volume Performed at [facility] [address], [place], [place] [address]    Culture   Final    NO GROWTH 4 DAYS Performed at [facility], [place] [address], [place],  [address]    Report Status PENDING  Incomplete      Studies: No results found.  Scheduled Meds: . enoxaparin (LOVENOX) injection  40 mg Subcutaneous Q24H  . haloperidol  2 mg Oral QHS  . mouth rinse  15 mL Mouth Rinse BID  . potassium & sodium phosphates  1 packet Oral TID WC & HS  . QUEtiapine  300 mg Oral QHS    Continuous Infusions: . sodium chloride Stopped  (08/17/19 2149)  .  ceFAZolin (ANCEF) IV 2 g (08/20/19 1041)     LOS: 9 days     Kayleen Memos, MD [facility] Pager [phone]  If 7PM-7AM, please contact night-coverage [email or web address] Password [facility] 08/20/2019, 12:40 PM

## 2019-08-20 NOTE — Progress Notes (Signed)
    [facility] for Infectious Disease    Date of Admission:  08/10/2019   Total days of antibiotics 11, last + blood cx on 1/10           ID: Justin Chaney is a 24 y.o. male with MSSA TV endocarditis, moderate regurgitation Principal Problem:   Staphylococcus aureus bacteremia Active Problems:   Schizoaffective disorder, bipolar type (HCC)   Tobacco use   SIRS (systemic inflammatory response syndrome) (HCC)   Normocytic anemia   Thrombocytopenia (HCC)   Polysubstance abuse (HCC)   IVDU (intravenous drug user)   S/P VP shunt   Transaminitis   Hyperbilirubinemia   Sepsis with acute organ dysfunction without septic shock (HCC)    Subjective: Afebrile, he is feeling better, he doesn't want to go to rehab  Medications:  . enoxaparin (LOVENOX) injection  40 mg Subcutaneous Q24H  . haloperidol  2 mg Oral QHS  . mouth rinse  15 mL Mouth Rinse BID  . potassium & sodium phosphates  1 packet Oral TID WC & HS  . QUEtiapine  300 mg Oral QHS  . senna-docusate  2 tablet Oral BID    Objective: Vital signs in last 24 hours: Temp:  [98 F (36.7 C)-98.4 F (36.9 C)] 98 F (36.7 C) (01/18 1403) Pulse Rate:  [91-108] 91 (01/18 1403) Resp:  [18-20] 20 (01/18 1403) BP: (104-113)/(54-73) 105/54 (01/18 1403) SpO2:  [97 %-99 %] 99 % (01/18 1403) Physical Exam  Constitutional: He is oriented to person, place, and time. He appears well-developed and well-nourished. No distress.  HENT:  Mouth/Throat: Oropharynx is clear and moist. No oropharyngeal exudate.  Cardiovascular: Normal rate, regular rhythm and normal heart sounds. Exam reveals no gallop and no friction rub.  No murmur heard.  Pulmonary/Chest: Effort normal and breath sounds normal. No respiratory distress. He has no wheezes.  Abdominal: Soft. Bowel sounds are normal. He exhibits no distension. There is no tenderness.  Lymphadenopathy:  He has no cervical adenopathy.  Neurological: He is alert and oriented to person, place,  and time.  Skin: Skin is warm and dry. No rash noted. No erythema.  Psychiatric: He has a normal mood and affect. His behavior is normal.     Lab Results Recent Labs    08/18/19 0858  WBC 10.8*  HGB 12.4*  HCT 39.6  NA 135  K 4.1  CL 102  CO2 24  BUN 12  CREATININE 0.63   Liver Panel Recent Labs    08/18/19 0858  PROT 7.9  ALBUMIN 3.1*  AST 23  ALT 29  ALKPHOS 92  BILITOT 0.9    Microbiology: 1/10 blood cx x 1 - is + but in 1/4 bottles 1/14 NGTD Studies/Results: No results found.   Assessment/Plan: MSSA endocarditis = continue on ancef, with plan to treat for a uncomplicated right sided endocarditis, since responded to antibiotics within 72hr, and also no pulmonary involvement. Can do 2 wk of iv cefazolin. Would use 1/14 as day 1 through 1/28, then can consider long acting infusion.  [facility] Cell: [phone] Pager: [phone]  08/20/2019, 2:30 PM

## 2019-08-21 LAB — CULTURE, BLOOD (ROUTINE X 2)
Culture: NO GROWTH
Special Requests: ADEQUATE

## 2019-08-21 NOTE — Progress Notes (Signed)
PROGRESS NOTE  Justin Chaney [id number] DOB: [birth year] DOA: 08/10/2019 PCP: Patient, No Pcp Per  HPI/Recap of past 57 hours: 24 year old gentleman with history of bipolar disorder, VP shunt, schizophrenia and IV drug use admitted to hospital with fever chills myalgia, frontal headache and shortness of breath of 1 week.  In the emergency room he was febrile with temperature 103, tachycardic 130, blood pressure low normal responded to fluid.  Lactic acidosis and platelets of 42.  Patient was empirically treated with antibiotics.  Blood cultures with MSSA and echocardiogram with endocarditis.  08/21/19: Patient was seen and examined.  No acute events overnight.  No new complaints.  He is on IV antibiotics for MSSA bacteremia and right-sided endocarditis.  Ancef from 08/16/2019 through 08/30/2019 then ID will consider long-acting infusion.  In-house due to history of IV drug abuse.    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  Drawn within 24 hours. Repeat blood cultures, 08/16/2019, negative so far. Clinically improving.  Sepsis resolving. Initially treated with broad-spectrum, now on Ancef.              Ancef from 08/16/2019 through 08/30/2019 then ID will consider long-acting infusion.  In-house due to history of IV drug abuse.  Transient anemia thrombocytopenia: Due to sepsis.  Improved.  History of drug use: Patient has  been using heroin recently.  No evidence of withdrawal.  Does not want any treatment.  Patient states that he can take care of himself.  Schizophrenia: Not taking medicine for a while.  Seen by psychiatry.  Started on Seroquel and Haldol at night.  Difficult iv access, mid line catheter placed today.  DVT prophylaxis: Lovenox subcu daily. Code Status: Full code Family Communication: None Disposition Plan: Remains in the hospital.  Not an outpatient infusion candidate due to active drug abuse.   Consultants:   Infectious disease  Psychiatry  Procedures:   None  Antimicrobials:    Objective: Vitals:   08/20/19 1403 08/20/19 2034 08/21/19 0533 08/21/19 1400  BP: (!) 105/54 116/61 (!) 100/57 (!) 96/50  Pulse: 91 (!) 106 87 91  Resp: 20 20 15 19   Temp: 98 F (36.7 C) 98.4 F (36.9 C) 98.7 F (37.1 C) 98.4 F (36.9 C)  TempSrc:  Oral Oral Oral  SpO2: 99% 100% 99% 96%  Weight:      Height:       No intake or output data in the 24 hours ending 08/21/19 1751 Filed Weights   08/12/19 0000  Weight: 103.4 kg    Exam:  . General: 24 y.o. year-old male developed and well nourished no acute distress.  Alert oriented x3.  .  Cardiovascular: Regular rate and rhythm no rubs or gallops. Marland Kitchen Respiratory: Clear to auscultation no wheezes or rales.   . Abdomen: Soft nontender normal bowel sounds present. . Musculoskeletal: No lower extremity edema bilaterally. Psychiatry: Mood is appropriate for condition and setting.  Data Reviewed: CBC: Recent Labs  Lab 08/15/19 0612 08/16/19  [id number] 08/17/19 0444 08/18/19 0858  WBC 11.4* 10.1 10.4 10.8*  HGB 10.6* 10.3* 10.9* 12.4*  HCT 33.6* 33.5* 35.0* 39.6  MCV 86.6 88.2 88.8 88.0  PLT 232 393 488* [id number]*   Basic Metabolic Panel: Recent Labs  Lab 08/15/19 0612 08/16/19 0458 08/17/19 0444 08/18/19 0858  NA 135 137 137 135  K 4.0 4.2 4.4 4.1  CL 104 106 105 102  CO2 22 25 23 24   GLUCOSE 120* 119* 120* 148*  BUN 8 10 11  12   CREATININE 0.58* 0.59* 0.59* 0.63  CALCIUM 8.4* 8.3* 8.7* 9.0  MG 2.3 2.2 2.2 2.0  PHOS 3.2 4.2 4.1 3.6   GFR: Estimated Creatinine Clearance: 147.9 mL/min (by C-G formula based on SCr of 0.63 mg/dL). Liver Function Tests: Recent Labs  Lab 08/15/19 0612 08/16/19 0458 08/17/19 0444 08/18/19 0858  AST 32 30 23 23   ALT 31 33 29 29  ALKPHOS 83 83 84 92  BILITOT 1.2 0.9 0.8 0.9  PROT 7.2 7.0 7.3 7.9  ALBUMIN 2.8* 2.7* 2.8* 3.1*   No results for input(s): LIPASE, AMYLASE in the last 168 hours. No results for input(s): AMMONIA in the last 168 hours. Coagulation Profile: No results for input(s): INR, PROTIME in the last 168 hours. Cardiac Enzymes: No results for input(s): CKTOTAL, CKMB, CKMBINDEX, TROPONINI in the last 168 hours. BNP (last 3 results) No results for input(s): PROBNP in the last 8760 hours. HbA1C: No results for input(s): HGBA1C in the last 72 hours. CBG: No results for input(s): GLUCAP in the last 168 hours. Lipid Profile: No results for input(s): CHOL, HDL, LDLCALC, TRIG, CHOLHDL, LDLDIRECT in the last 72 hours. Thyroid Function Tests: No results for input(s): TSH, T4TOTAL, FREET4, T3FREE, THYROIDAB in the last 72 hours. Anemia Panel: No results for input(s): VITAMINB12, FOLATE, FERRITIN, TIBC, IRON, RETICCTPCT in the last 72 hours. Urine analysis:    Component Value Date/Time   COLORURINE YELLOW 08/10/2019 1615   APPEARANCEUR CLEAR 08/10/2019 1615   LABSPEC 1.018 08/10/2019 1615   PHURINE 7.0 08/10/2019 1615   GLUCOSEU NEGATIVE 08/10/2019 1615   HGBUR SMALL (A) 08/10/2019 1615   BILIRUBINUR NEGATIVE 08/10/2019 1615   KETONESUR NEGATIVE 08/10/2019 1615   PROTEINUR NEGATIVE 08/10/2019 1615   UROBILINOGEN 2.0 (H) 04/01/2014 2200   NITRITE NEGATIVE 08/10/2019 1615   LEUKOCYTESUR NEGATIVE 08/10/2019 1615   Sepsis Labs: @LABRCNTIP (procalcitonin:4,lacticidven:4)  ) Recent Results (from the past 240 hour(s))  MRSA PCR Screening     Status: None    Collection Time: 08/11/19 11:00 PM   Specimen: Nasopharyngeal  Result Value Ref Range Status   MRSA by PCR NEGATIVE NEGATIVE Final    Comment:        The GeneXpert MRSA Assay (FDA approved for NASAL specimens only), is one component of a comprehensive MRSA colonization surveillance program. It is not intended to diagnose MRSA infection nor to guide or monitor treatment for MRSA infections. Performed at [facility], [place] [address], [place], [place] [address]   Culture, blood (routine x 2)     Status: Abnormal   Collection Time: 08/12/19 12:43 PM   Specimen: BLOOD LEFT ARM  Result Value Ref Range Status   Specimen Description   Final    BLOOD LEFT ARM Performed at [facility] [address], [place], [place] [address]    Special Requests   Final    BOTTLES DRAWN AEROBIC AND ANAEROBIC Blood Culture adequate volume Performed at [facility] [address]  [place], [place] [address]    Culture  Setup Time   Final    ANAEROBIC BOTTLE ONLY GRAM POSITIVE COCCI CRITICAL VALUE NOTED.  VALUE IS CONSISTENT WITH PREVIOUSLY REPORTED AND CALLED VALUE.    Culture (A)  Final    STAPHYLOCOCCUS AUREUS SUSCEPTIBILITIES PERFORMED ON PREVIOUS CULTURE WITHIN THE LAST 5 DAYS. Performed at [facility], [place] [address] [place], [place] [address]    Report Status 08/17/2019 FINAL  Final  Culture, blood (routine x 2)     Status: None   Collection Time: 08/12/19 12:43 PM   Specimen: BLOOD  Result Value Ref Range Status   Specimen Description BLOOD BLOOD LEFT HAND  Final   Special Requests   Final    BOTTLES DRAWN AEROBIC ONLY Blood Culture adequate volume Performed at [facility] [address], [place], [place] [address]    Culture NO GROWTH 7 DAYS  Final   Report Status 08/19/2019 FINAL  Final  Culture, blood (routine x 2)     Status: None   Collection Time: 08/16/19  9:19 AM   Specimen: BLOOD RIGHT HAND  Result  Value Ref Range Status   Specimen Description   Final    BLOOD RIGHT HAND Performed at [facility] [address] [place], [place] [address]    Special Requests   Final    BOTTLES DRAWN AEROBIC AND ANAEROBIC Blood Culture adequate volume Performed at [facility] [address], [place], [place] [address]    Culture   Final    NO GROWTH 5 DAYS Performed at [facility], [place] [address], [place], [place] [address]    Report Status 08/21/2019 FINAL  Final      Studies: No results found.  Scheduled Meds: . enoxaparin (LOVENOX) injection  40 mg Subcutaneous Q24H  . haloperidol  2 mg Oral QHS  . mouth rinse  15 mL Mouth Rinse BID  . potassium & sodium phosphates  1 packet Oral TID WC & HS  . QUEtiapine  300 mg Oral QHS  . senna-docusate  2 tablet Oral BID    Continuous Infusions: . sodium chloride Stopped (08/17/19 2149)  .  ceFAZolin (ANCEF) IV 2 g (08/21/19 1646)     LOS: 10 days     Kayleen Memos, MD [facility] Pager [phone]  If 7PM-7AM, please contact night-coverage [email or web address] Password [facility] 08/21/2019, 5:51 PM

## 2019-08-22 NOTE — Progress Notes (Signed)
PROGRESS NOTE  Justin Chaney [id number] DOB: [birth year] DOA: 08/10/2019 PCP: Patient, No Pcp Per  HPI/Recap of past 51 hours: 24 year old gentleman with history of bipolar disorder, VP shunt, schizophrenia and IV drug use admitted to hospital with fever chills myalgia, frontal headache and shortness of breath of 1 week.  In the emergency room he was febrile with temperature 103, tachycardic 130, blood pressure low normal responded to fluid.  Lactic acidosis and platelets of 42.  Patient was empirically treated with antibiotics.  Blood cultures with MSSA and echocardiogram with endocarditis.  He is on IV antibiotics for MSSA bacteremia and right-sided endocarditis.  Ancef from 08/16/2019 through 08/30/2019 then ID will consider long-acting infusion.  In-house due to history of IV drug abuse.  08/22/19: Seen and examined.  No acute events overnight.  He has no new complaints.    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  Drawn within 24 hours. Repeat blood cultures, 08/16/2019, negative so far. Clinically improving.  Sepsis resolving. Initially treated with broad-spectrum, now on Ancef.              Ancef from 08/16/2019 through 08/30/2019 then ID will consider long-acting infusion.  In-house due to history of IV drug abuse.  Transient anemia thrombocytopenia: Due to sepsis.  Improved.  History of drug use: Patient has been  using heroin recently.  No evidence of withdrawal.  Does not want any treatment.  Patient states that he can take care of himself.  Schizophrenia: Not taking medicine for a while.  Seen by psychiatry.  Started on Seroquel and Haldol at night.   DVT prophylaxis: Lovenox subcu daily. Code Status: Full code Family Communication: None Disposition Plan: Remains in the hospital.  Not an outpatient infusion candidate due to active drug abuse.   Consultants:   Infectious disease  Psychiatry  Procedures:   None  Antimicrobials:    Objective: Vitals:   08/21/19 1400 08/21/19 1957 08/22/19 0704 08/22/19 1402  BP: (!) 96/50 114/72 115/76 113/68  Pulse: 91 (!) 102 97 (!) 103  Resp: 19 18 18 18   Temp: 98.4 F (36.9 C) 98.8 F (37.1 C) 98.2 F (36.8 C) 98.5 F (36.9 C)  TempSrc: Oral Oral Oral Oral  SpO2: 96% 99% 98% 99%  Weight:      Height:        Intake/Output Summary (Last 24 hours) at 08/22/2019 1425 Last data filed at 08/21/2019 2200 Gross per 24 hour  Intake 240 ml  Output --  Net 240 ml   Filed Weights   08/12/19 0000  Weight: 103.4 kg    Exam:  . General: 24 y.o. year-old male well-developed and nourished no acute distress.  Alert and oriented x3.  .  Cardiovascular: Regular rate and rhythm no rubs or gallops.   Marland Kitchen Respiratory: Clear to auscultation no wheezes or rales.   . Abdomen: Soft nontender normal bowel sounds present. . Musculoskeletal: No lower extremity edema bilaterally. Marland Kitchen Psychiatry: Mood is appropriate for  condition and setting.   Data Reviewed: CBC: Recent Labs  Lab 08/16/19 0458 08/17/19 0444 08/18/19 0858  WBC 10.1 10.4 10.8*  HGB 10.3* 10.9* 12.4*  HCT 33.5* 35.0* 39.6  MCV 88.2 88.8 88.0  PLT 393 488* [id number]*   Basic Metabolic Panel: Recent Labs  Lab 08/16/19 0458 08/17/19 0444 08/18/19 0858  NA 137 137 135  K 4.2 4.4 4.1  CL 106 105 102  CO2 25 23 24   GLUCOSE 119* 120* 148*  BUN 10 11 12   CREATININE 0.59* 0.59* 0.63   CALCIUM 8.3* 8.7* 9.0  MG 2.2 2.2 2.0  PHOS 4.2 4.1 3.6   GFR: Estimated Creatinine Clearance: 147.9 mL/min (by C-G formula based on SCr of 0.63 mg/dL). Liver Function Tests: Recent Labs  Lab 08/16/19 0458 08/17/19 0444 08/18/19 0858  AST 30 23 23   ALT 33 29 29  ALKPHOS 83 84 92  BILITOT 0.9 0.8 0.9  PROT 7.0 7.3 7.9  ALBUMIN 2.7* 2.8* 3.1*   No results for input(s): LIPASE, AMYLASE in the last 168 hours. No results for input(s): AMMONIA in the last 168 hours. Coagulation Profile: No results for input(s): INR, PROTIME in the last 168 hours. Cardiac Enzymes: No results for input(s): CKTOTAL, CKMB, CKMBINDEX, TROPONINI in the last 168 hours. BNP (last 3 results) No results for input(s): PROBNP in the last 8760 hours. HbA1C: No results for input(s): HGBA1C in the last 72 hours. CBG: No results for input(s): GLUCAP in the last 168 hours. Lipid Profile: No results for input(s): CHOL, HDL, LDLCALC, TRIG, CHOLHDL, LDLDIRECT in the last 72 hours. Thyroid Function Tests: No results for input(s): TSH, T4TOTAL, FREET4, T3FREE, THYROIDAB in the last 72 hours. Anemia Panel: No results for input(s): VITAMINB12, FOLATE, FERRITIN, TIBC, IRON, RETICCTPCT in the last 72 hours. Urine analysis:    Component Value Date/Time   COLORURINE YELLOW 08/10/2019 1615   APPEARANCEUR CLEAR 08/10/2019 1615   LABSPEC 1.018 08/10/2019 1615   PHURINE 7.0 08/10/2019 1615   GLUCOSEU NEGATIVE 08/10/2019 1615   HGBUR SMALL (A) 08/10/2019 1615   BILIRUBINUR NEGATIVE 08/10/2019 1615   KETONESUR NEGATIVE 08/10/2019 1615   PROTEINUR NEGATIVE 08/10/2019 1615   UROBILINOGEN 2.0 (H) 04/01/2014 2200   NITRITE NEGATIVE 08/10/2019 1615   LEUKOCYTESUR NEGATIVE 08/10/2019 1615   Sepsis Labs: @LABRCNTIP (procalcitonin:4,lacticidven:4)  ) Recent Results (from the past 240 hour(s))  Culture, blood (routine x 2)     Status: None   Collection Time: 08/16/19  9:19 AM   Specimen: BLOOD RIGHT HAND  Result Value  Ref Range Status   Specimen Description   Final    BLOOD RIGHT HAND Performed at [facility] [address] [place], [place] [address]    Special Requests   Final    BOTTLES DRAWN AEROBIC AND ANAEROBIC Blood Culture adequate volume Performed at [facility] [address] [place], [place] [address]    Culture   Final    NO GROWTH 5 DAYS Performed at [facility], [place] [address] [place], [place] [address]    Report Status 08/21/2019 FINAL  Final      Studies: No results found.  Scheduled Meds: . enoxaparin (LOVENOX) injection  40 mg Subcutaneous Q24H  . haloperidol  2 mg Oral QHS  . mouth rinse  15 mL Mouth Rinse BID  . potassium & sodium phosphates  1 packet Oral TID WC & HS  . QUEtiapine  300 mg Oral QHS  . senna-docusate  2 tablet Oral BID  Continuous Infusions: . sodium chloride Stopped (08/17/19 2149)  .  ceFAZolin (ANCEF) IV 2 g (08/22/19 1159)     LOS: 11 days     Kayleen Memos, MD [facility] Pager [phone]  If 7PM-7AM, please contact night-coverage [email or web address] Password [email or web address] 08/22/2019, 2:25 PM

## 2019-08-23 LAB — BASIC METABOLIC PANEL
Anion gap: 11 (ref 5–15)
BUN: 11 mg/dL (ref 6–20)
CO2: 27 mmol/L (ref 22–32)
Calcium: 9.5 mg/dL (ref 8.9–10.3)
Chloride: 100 mmol/L (ref 98–111)
Creatinine, Ser: 0.77 mg/dL (ref 0.61–1.24)
GFR calc Af Amer: >60 mL/min (ref >60)
GFR calc non Af Amer: >60 mL/min (ref >60)
Glucose, Bld: 109 mg/dL — ABNORMAL HIGH (ref 70–99)
Potassium: 4.2 mmol/L (ref 3.5–5.1)
Sodium: 138 mmol/L (ref 135–145)

## 2019-08-23 LAB — CBC
HCT: 39.5 % (ref 39.0–52.0)
Hemoglobin: 12.1 g/dL — ABNORMAL LOW (ref 13.0–17.0)
MCH: 27.3 pg (ref 26.0–34.0)
MCHC: 30.6 g/dL (ref 30.0–36.0)
MCV: 89 fL (ref 80.0–100.0)
Platelets: 434 10*3/uL — ABNORMAL HIGH (ref 150–400)
RBC: 4.44 MIL/uL (ref 4.22–5.81)
RDW: 13.6 % (ref 11.5–15.5)
WBC: 11.8 10*3/uL — ABNORMAL HIGH (ref 4.0–10.5)
nRBC: 0 % (ref 0.0–0.2)

## 2019-08-23 NOTE — Progress Notes (Signed)
PROGRESS NOTE  Justin Chaney [id number] DOB: 1996/04/05 DOA: 08/10/2019 PCP: Patient, No Pcp Per  HPI/Recap of past 41 hours: 24 year old gentleman with history of bipolar disorder, VP shunt, schizophrenia and IV drug use admitted to hospital with fever chills myalgia, frontal headache and shortness of breath of 1 week.  In the emergency room he was febrile with temperature 103, tachycardic 130, blood pressure low normal responded to fluid.  Lactic acidosis and platelets of 42.  Patient was empirically treated with antibiotics.  Blood cultures with MSSA and echocardiogram with endocarditis.  He is on IV antibiotics for MSSA bacteremia and right-sided endocarditis.  Ancef from 08/16/2019 through 08/30/2019 then ID will consider long-acting infusion.  In-house due to history of IV drug abuse.  08/23/19: Seen and examined.  No acute events overnight.  He has no new complaints.    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  Drawn within 24 hours. Repeat blood cultures, 08/16/2019, negative so far. Clinically improving.  Sepsis resolving. Initially treated with broad-spectrum, now on Ancef.              Ancef from 08/16/2019 through 08/30/2019 then ID will consider long-acting infusion.  In-house due to history of IV drug abuse.  Transient anemia thrombocytopenia: Due to sepsis.  Improved.  History of drug use: Patient has been  using heroin recently.  No evidence of withdrawal.  Does not want any treatment.  Patient states that he can take care of himself.  Schizophrenia: Not taking medicine for a while.  Seen by psychiatry.  Started on Seroquel and Haldol at night.  Mild leukocytosis, likely reactive Mild elevation of wbc 11.8 No fever No complaints   DVT prophylaxis: Lovenox subcu daily. Code Status: Full code Family Communication: None Disposition Plan: Remains in the hospital.  Not an outpatient infusion candidate due to active drug abuse.   Consultants:   Infectious disease  Psychiatry  Procedures:   None  Antimicrobials:    Objective: Vitals:   08/22/19 1402 08/22/19 2004 08/23/19 0629 08/23/19 1349  BP: 113/68 127/79 112/67 108/67  Pulse: (!) 103 (!) 102 91 (!) 107  Resp: 18 17 17 18   Temp: 98.5 F (36.9 C) 98.5 F (36.9 C) 98.5 F (36.9 C) 98.9 F (37.2 C)  TempSrc: Oral Oral Oral Oral  SpO2: 99% 100% 99% 99%  Weight:      Height:       No intake or output data in the 24 hours ending 08/23/19 1422 Filed Weights   08/12/19 0000  Weight: 103.4 kg    Exam:  No changes from prior . General: 24 y.o. year-old male well-developed and nourished no acute distress.  Alert and oriented x3.  .  Cardiovascular: Regular rate and rhythm no rubs or gallops.   Marland Kitchen Respiratory: Clear to auscultation no wheezes or rales.   . Abdomen: Soft nontender normal bowel sounds present. . Musculoskeletal: No lower extremity edema bilaterally. Marland Kitchen Psychiatry: Mood is appropriate for condition and  setting.   Data Reviewed: CBC: Recent Labs  Lab 08/17/19 0444 08/18/19 0858 08/23/19 0555  WBC 10.4 10.8* 11.8*  HGB 10.9* 12.4* 12.1*  HCT 35.0* 39.6 39.5  MCV 88.8 88.0 89.0  PLT 488* 525* [id number]*   Basic Metabolic Panel: Recent Labs  Lab 08/17/19 0444 08/18/19 0858 08/23/19 0555  NA 137 135 138  K 4.4 4.1 4.2  CL 105 102 100  CO2 23 24 27   GLUCOSE 120* 148* 109*  BUN 11 12 11     CREATININE 0.59* 0.63 0.77  CALCIUM 8.7* 9.0 9.5  MG 2.2 2.0  --   PHOS 4.1 3.6  --    GFR: Estimated Creatinine Clearance: 147.9 mL/min (by C-G formula based on SCr of 0.77 mg/dL). Liver Function Tests: Recent Labs  Lab 08/17/19 0444 08/18/19 0858  AST 23 23  ALT 29 29  ALKPHOS 84 92  BILITOT 0.8 0.9  PROT 7.3 7.9  ALBUMIN 2.8* 3.1*   No results for input(s): LIPASE, AMYLASE in the last 168 hours. No results for input(s): AMMONIA in the last 168 hours. Coagulation Profile: No results for input(s): INR, PROTIME in the last 168 hours. Cardiac Enzymes: No results for input(s): CKTOTAL, CKMB, CKMBINDEX, TROPONINI in the last 168 hours. BNP (last 3 results) No results for input(s): PROBNP in the last 8760 hours. HbA1C: No results for input(s): HGBA1C in the last 72 hours. CBG: No results for input(s): GLUCAP in the last 168 hours. Lipid Profile: No results for input(s): CHOL, HDL, LDLCALC, TRIG, CHOLHDL, LDLDIRECT in the last 72 hours. Thyroid Function Tests: No results for input(s): TSH, T4TOTAL, FREET4, T3FREE, THYROIDAB in the last 72 hours. Anemia Panel: No results for input(s): VITAMINB12, FOLATE, FERRITIN, TIBC, IRON, RETICCTPCT in the last 72 hours. Urine analysis:    Component Value Date/Time   COLORURINE YELLOW 08/10/2019 1615   APPEARANCEUR CLEAR 08/10/2019 1615   LABSPEC 1.018 08/10/2019 1615   PHURINE 7.0 08/10/2019 1615   GLUCOSEU NEGATIVE 08/10/2019 1615   HGBUR SMALL (A) 08/10/2019 1615   BILIRUBINUR NEGATIVE 08/10/2019 1615   KETONESUR NEGATIVE 08/10/2019 1615   PROTEINUR NEGATIVE 08/10/2019 1615   UROBILINOGEN 2.0 (H) 04/01/2014 2200   NITRITE NEGATIVE 08/10/2019 1615   LEUKOCYTESUR NEGATIVE 08/10/2019 1615   Sepsis Labs: @LABRCNTIP (procalcitonin:4,lacticidven:4)  ) Recent Results (from the past 240 hour(s))  Culture, blood (routine x 2)     Status: None   Collection Time: 08/16/19  9:19 AM   Specimen: BLOOD RIGHT HAND  Result Value Ref  Range Status   Specimen Description   Final    BLOOD RIGHT HAND Performed at [facility] [address] [place], [place] [address]    Special Requests   Final    BOTTLES DRAWN AEROBIC AND ANAEROBIC Blood Culture adequate volume Performed at [facility] [address], [place], [place] [address]    Culture   Final    NO GROWTH 5 DAYS Performed at [facility], [place] [address], [place], [place] [address]    Report Status 08/21/2019 FINAL  Final      Studies: No results found.  Scheduled Meds: . enoxaparin (LOVENOX) injection  40 mg Subcutaneous Q24H  . haloperidol  2 mg Oral QHS  . mouth rinse  15 mL Mouth Rinse BID  . potassium & sodium phosphates  1 packet Oral TID WC & HS  . QUEtiapine  300 mg Oral QHS  . senna-docusate  2 tablet Oral BID    Continuous Infusions: .  sodium chloride Stopped (08/17/19 2149)  .  ceFAZolin (ANCEF) IV 2 g (08/23/19 1001)     LOS: 12 days     Kayleen Memos, MD [facility] Pager [phone]  If 7PM-7AM, please contact night-coverage [email or web address] Password [email or web address] 08/23/2019, 2:22 PM

## 2019-08-24 MED ORDER — IBUPROFEN 200 MG PO TABS
600.0000 mg | ORAL_TABLET | Freq: Once | ORAL | Status: AC
  Administered 2019-08-24: 600 mg via ORAL
  Filled 2019-08-24: qty 3

## 2019-08-24 MED ORDER — LACTATED RINGERS IV BOLUS
1000.0000 mL | Freq: Once | INTRAVENOUS | Status: AC
  Administered 2019-08-24: 1000 mL via INTRAVENOUS

## 2019-08-24 MED ORDER — SODIUM CHLORIDE 0.9 % IV SOLN: INTRAVENOUS | Status: AC

## 2019-08-24 NOTE — Progress Notes (Signed)
PROGRESS NOTE  Justin Chaney [id number] DOB: [birth year] DOA: 08/10/2019 PCP: Patient, No Pcp Per  HPI/Recap of past 60 hours: 24 year old gentleman with history of bipolar disorder, VP shunt, schizophrenia and IV drug use admitted to hospital with fever chills myalgia, frontal headache and shortness of breath of 1 week.  In the emergency room he was febrile with temperature 103, tachycardic 130, blood pressure low normal responded to fluid.  Lactic acidosis and platelets of 42.  Patient was empirically treated with antibiotics.  Blood cultures with MSSA and echocardiogram with endocarditis.  He is on IV antibiotics for MSSA bacteremia and right-sided endocarditis.  Ancef from 08/16/2019 through 08/30/2019 then ID will consider long-acting infusion.  In-house due to history of IV drug abuse.  08/24/19: Seen and examined.  Patient had a fall overnight.  He reports he felt dizzy, felt like he was about to pass out prior to his fall.  Denies hitting his head.  Denies any pain this morning.  Orthostatic vital signs and twelve-lead EKG ordered.  Patient had refused EKG.     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  Drawn within 24 hours. Repeat blood cultures, 08/16/2019, negative so far. Clinically improving.  Sepsis resolving. Initially treated with broad-spectrum, now on Ancef.              Ancef from 08/16/2019 through  08/30/2019 then ID will consider long-acting infusion.  In-house due to history of IV drug abuse.  Transient anemia thrombocytopenia: Due to sepsis.  Improved.  History of drug use: Patient has been using heroin recently.  No evidence of withdrawal.  Does not want any treatment.  Patient states that he can take care of himself.  Schizophrenia: Not taking medicine for a while.  Seen by psychiatry.  Started on Seroquel and Haldol at night.  Mild leukocytosis, likely reactive Mild elevation of wbc 11.8 No fever No complaints  Fall in the hospital Possibly orthosthatic hypotension Orthosthatic vital signs pending Has declined 12 lead EKG Started IV fluid NS at 75cc/hr Continue to monitor w fall precautions PT to assess   DVT prophylaxis: Lovenox subcu daily. Code Status: Full code Family Communication: None Disposition Plan: Remains in the hospital.  Not an outpatient infusion candidate due to active drug abuse.   Consultants:   Infectious disease  Psychiatry  Procedures:   None  Antimicrobials:    Objective: Vitals:   08/24/19 0846 08/24/19 0849 08/24/19 0852 08/24/19 0900  BP: (!) 110/47 (!) 100/49 (!) 95/45   Pulse: (!) 101 (!) 107 (!) 123 98  Resp: 16 18 18    Temp: 98.6 F (37 C)     TempSrc: Oral     SpO2: 97% 99% 98%   Weight:      Height:        Intake/Output Summary (Last 24 hours) at 08/24/2019 1307 Last data filed at 08/24/2019 1000 Gross per 24 hour  Intake 300.66 ml  Output -  Net 300.66 ml  Filed Weights   08/12/19 0000  Weight: 103.4 kg    Exam:  No changes from prior . General: 24 y.o. year-old male well-developed well-nourished in no acute distress.  Alert and oriented x3.  .  Cardiovascular: Regular rate and rhythm no rubs or gallops.   Marland Kitchen Respiratory: Clear to auscultation no wheezes or rales.   . Abdomen: Soft nontender normal bowel sounds present.   . Musculoskeletal: No lower extremity edema bilaterally.   Psychiatry:  Mood is appropriate for condition and setting.  Data Reviewed: CBC: Recent Labs  Lab 08/18/19 0858 08/23/19 0555  WBC 10.8* 11.8*  HGB 12.4* 12.1*  HCT 39.6 39.5  MCV 88.0 89.0  PLT 525* [id number]*   Basic Metabolic Panel: Recent Labs  Lab 08/18/19 0858 08/23/19 0555  NA 135 138  K 4.1 4.2  CL 102 100  CO2 24 27  GLUCOSE 148* 109*  BUN 12 11  CREATININE 0.63 0.77  CALCIUM 9.0 9.5  MG 2.0  --   PHOS 3.6  --    GFR: Estimated Creatinine Clearance: 147.9 mL/min (by C-G formula based on SCr of 0.77 mg/dL). Liver Function Tests: Recent Labs  Lab 08/18/19 0858  AST 23  ALT 29  ALKPHOS 92  BILITOT 0.9  PROT 7.9  ALBUMIN 3.1*   No results for input(s): LIPASE, AMYLASE in the last 168 hours. No results for input(s): AMMONIA in the last 168 hours. Coagulation Profile: No results for input(s): INR, PROTIME in the last 168 hours. Cardiac Enzymes: No results for input(s): CKTOTAL, CKMB, CKMBINDEX, TROPONINI in the last 168 hours. BNP (last 3 results) No results for input(s): PROBNP in the last 8760 hours. HbA1C: No results for input(s): HGBA1C in the last 72 hours. CBG: No results for input(s): GLUCAP in the last 168 hours. Lipid Profile: No results for input(s): CHOL, HDL, LDLCALC, TRIG, CHOLHDL, LDLDIRECT in the last 72 hours. Thyroid Function Tests: No results for input(s): TSH, T4TOTAL, FREET4, T3FREE, THYROIDAB in the last 72 hours. Anemia Panel: No results for input(s): VITAMINB12, FOLATE, FERRITIN, TIBC, IRON, RETICCTPCT in the last 72 hours. Urine analysis:    Component Value Date/Time   COLORURINE YELLOW 08/10/2019 1615   APPEARANCEUR CLEAR 08/10/2019 1615   LABSPEC 1.018 08/10/2019 1615   PHURINE 7.0 08/10/2019 1615   GLUCOSEU NEGATIVE 08/10/2019 1615   HGBUR SMALL (A) 08/10/2019 1615   BILIRUBINUR NEGATIVE 08/10/2019 1615   KETONESUR NEGATIVE 08/10/2019 1615   PROTEINUR NEGATIVE 08/10/2019 1615   UROBILINOGEN 2.0 (H) 04/01/2014 2200   NITRITE NEGATIVE  08/10/2019 1615   LEUKOCYTESUR NEGATIVE 08/10/2019 1615   Sepsis Labs: @LABRCNTIP (procalcitonin:4,lacticidven:4)  ) Recent Results (from the past 240 hour(s))  Culture, blood (routine x 2)     Status: None   Collection Time: 08/16/19  9:19 AM   Specimen: BLOOD RIGHT HAND  Result Value Ref Range Status   Specimen Description   Final    BLOOD RIGHT HAND Performed at [facility] [address], [place], [place] [address]    Special Requests   Final    BOTTLES DRAWN AEROBIC AND ANAEROBIC Blood Culture adequate volume Performed at [facility] [address], [place], [place] [address]    Culture   Final    NO GROWTH 5 DAYS Performed at [facility], [place] [address] [place], [place]  [address]    Report Status 08/21/2019 FINAL  Final      Studies: No results found.  Scheduled Meds: . enoxaparin (LOVENOX) injection  40 mg Subcutaneous Q24H  . haloperidol  2 mg Oral QHS  . mouth rinse  15 mL Mouth Rinse BID  . potassium & sodium phosphates  1 packet Oral TID WC & HS  . QUEtiapine  300 mg Oral QHS  . senna-docusate  2 tablet Oral BID    Continuous Infusions: . sodium chloride Stopped (08/17/19 2149)  . sodium chloride Stopped (08/24/19 0646)  .  ceFAZolin (ANCEF) IV Stopped (08/24/19 0934)     LOS: 13 days     Kayleen Memos, MD [facility] Pager [phone]  If 7PM-7AM, please contact night-coverage [email or web address] Password [facility] 08/24/2019, 1:07 PM

## 2019-08-24 NOTE — Progress Notes (Signed)
    [facility]    Date of Admission:  08/10/2019   Total days of antibiotics 15           ID: Justin Chaney is a 24 y.o. male with  Principal Problem:   Staphylococcus aureus bacteremia Active Problems:   Schizoaffective disorder, bipolar type (HCC)   Tobacco use   SIRS (systemic inflammatory response syndrome) (HCC)   Normocytic anemia   Thrombocytopenia (HCC)   Polysubstance abuse (HCC)   IVDU (intravenous drug user)   S/P VP shunt   Transaminitis   Hyperbilirubinemia   Sepsis with acute organ dysfunction without septic shock (HCC)    Subjective: Afebrile.  Medications:  . enoxaparin (LOVENOX) injection  40 mg Subcutaneous Q24H  . haloperidol  2 mg Oral QHS  . mouth rinse  15 mL Mouth Rinse BID  . potassium & sodium phosphates  1 packet Oral TID WC & HS  . QUEtiapine  300 mg Oral QHS  . senna-docusate  2 tablet Oral BID    Objective: Vital signs in last 24 hours: Temp:  [97.6 F (36.4 C)-99.4 F (37.4 C)] 97.6 F (36.4 C) (01/22 1349) Pulse Rate:  [85-123] 95 (01/22 1349) Resp:  [16-18] 18 (01/22 1349) BP: (95-125)/(45-80) 122/76 (01/22 1349) SpO2:  [97 %-100 %] 98 % (01/22 1349)  Physical Exam  Constitutional: He is oriented to person, place, and time. He appears well-developed and well-nourished. No distress.  HENT:  Mouth/Throat: Oropharynx is clear and moist. No oropharyngeal exudate.  Cardiovascular: Normal rate, regular rhythm and normal heart sounds. Exam reveals no gallop and no friction rub.  No murmur heard.  Pulmonary/Chest: Effort normal and breath sounds normal. No respiratory distress. He has no wheezes.  Abdominal: Soft. Bowel sounds are normal. He exhibits no distension. There is no tenderness.  Lymphadenopathy:  He has no cervical adenopathy.  Neurological: He is alert and oriented to person, place, and time.  Skin: Skin is warm and dry. No rash noted. No erythema.  Psychiatric: He has a normal mood and affect.  His behavior is normal.     Lab Results Recent Labs    08/23/19 0555  WBC 11.8*  HGB 12.1*  HCT 39.5  NA 138  K 4.2  CL 100  CO2 27  BUN 11  CREATININE 0.77    Microbiology: 1/14- blood cx negative Studies/Results: No results found.   Assessment/Plan: mssa TV endocarditis = Continue on cefazolin plan for 2 wk through 1/28 then will discuss how to finish out 6 wk course with either orals such as linezolid vs. dalbavancin infusion. Will see back on [facility] Infectious Diseases Cell: [phone] Pager: [phone]  08/24/2019, 3:41 PM

## 2019-08-24 NOTE — Progress Notes (Signed)
Patient had an unassisted fall in the hallway after feeling sick. No injuries noted during assessment. Vitals taken and Zofran received. Patient c/o of nausea and heartburn and refused EKG. Patient assisted back in bed with the bed alarm on until he felt better. Dr. Silas [place] called made aware.

## 2019-08-24 NOTE — Progress Notes (Signed)
Alerted by nursing staff this morning that pt had fall during the night and the MD had ordered an EKG and orthostatic VS, which the pt had refused.  I rounded on the patient and requested his permission to allow [name] to complete an EKG and orthostatic vital signs.  The pt agreed to VS, however, not to the EKG, stating "nothing is wrong with my heart".  I educated the pt on the importance of obtaining the EKG to evaluate his heart to ensure no issues that would have resulted in a fall or further complications.  Pt continued to refuse the EKG at this time.

## 2019-08-24 NOTE — Progress Notes (Signed)
Notified by staff that pt had unhooked his IV and was asking for a red cap. Leadership entered the pt room to educate the pt on the importance of not unhooking his IV due to safety concerns and to alert nursing staff for IV needs.  Pt stated he needs a "break" from the IV at this time and refused reconnection.  In addition, pt a high fall risk due to unassisted fall last night when walking in the hallway. Pt seen turning off bed alarm when initiated and would alarm.  Explained to pt the importance of the bed alarm use to prevent future falls and for pt to alert staff when he needs to get out of bed and we can assist. Pt states he will not fall and turns off alarm when initiated by staff.  Pt upset at this time and instructs staff to leave the room.  Primary RN notified.

## 2019-08-24 NOTE — Progress Notes (Signed)
Patient stated he would allow [name] to do ekg, then refused to let the NT apply the lower leads. Stated im not lowering my shorts.  Justin Chaney also talked with pateint, he continues to refuse. Bethann Punches RN

## 2019-08-24 NOTE — Progress Notes (Signed)
   08/24/19 2008  Vitals  Temp (!) 102.4 F (39.1 C)  BP (!) 91/44  MAP (mmHg) (!) 60  BP Location Left Arm  BP Method Automatic  Patient Position (if appropriate) Lying  Pulse Rate (!) 110  Oxygen Therapy  SpO2 100 %  O2 Device Room Air  MEWS Score  MEWS Temp 2  MEWS Systolic 1  MEWS Pulse 1  MEWS RR 0  MEWS LOC 0  MEWS Score 4  MEWS Score Color Red  MEWS Assessment  Is this an acute change? Yes  MEWS guidelines implemented *See Datto  Provider Notification  Provider Name/Title Bodenheimer  Time Provider Notified 2016  Notification Type Page  Notification Reason Change in status  Rapid Response Notification  Name of Rapid Response RN Notified [place]  Date Rapid Response Notified 08/24/19  Time Rapid Response Notified 2017  Note  Observations c/o fever  mews intiated

## 2019-08-25 ENCOUNTER — Inpatient Hospital Stay (HOSPITAL_COMMUNITY): Payer: Medicaid Other

## 2019-08-25 LAB — COMPREHENSIVE METABOLIC PANEL
ALT: 25 U/L (ref 0–44)
AST: 20 U/L (ref 15–41)
Albumin: 3.3 g/dL — ABNORMAL LOW (ref 3.5–5.0)
Alkaline Phosphatase: 92 U/L (ref 38–126)
Anion gap: 9 (ref 5–15)
BUN: 7 mg/dL (ref 6–20)
CO2: 27 mmol/L (ref 22–32)
Calcium: 9.2 mg/dL (ref 8.9–10.3)
Chloride: 101 mmol/L (ref 98–111)
Creatinine, Ser: 0.85 mg/dL (ref 0.61–1.24)
GFR calc Af Amer: >60 mL/min (ref >60)
GFR calc non Af Amer: >60 mL/min (ref >60)
Glucose, Bld: 120 mg/dL — ABNORMAL HIGH (ref 70–99)
Potassium: 3.8 mmol/L (ref 3.5–5.1)
Sodium: 137 mmol/L (ref 135–145)
Total Bilirubin: 0.9 mg/dL (ref 0.3–1.2)
Total Protein: 7.7 g/dL (ref 6.5–8.1)

## 2019-08-25 LAB — CBC WITH DIFFERENTIAL/PLATELET
Abs Immature Granulocytes: 0.09 10*3/uL — ABNORMAL HIGH (ref 0.00–0.07)
Basophils Absolute: 0.1 10*3/uL (ref 0.0–0.1)
Basophils Relative: 1 %
Eosinophils Absolute: 0 10*3/uL (ref 0.0–0.5)
Eosinophils Relative: 0 %
HCT: 38.7 % — ABNORMAL LOW (ref 39.0–52.0)
Hemoglobin: 11.8 g/dL — ABNORMAL LOW (ref 13.0–17.0)
Immature Granulocytes: 1 %
Lymphocytes Relative: 14 %
Lymphs Abs: 1.8 10*3/uL (ref 0.7–4.0)
MCH: 27.1 pg (ref 26.0–34.0)
MCHC: 30.5 g/dL (ref 30.0–36.0)
MCV: 88.8 fL (ref 80.0–100.0)
Monocytes Absolute: 1.4 10*3/uL — ABNORMAL HIGH (ref 0.1–1.0)
Monocytes Relative: 10 %
Neutro Abs: 9.8 10*3/uL — ABNORMAL HIGH (ref 1.7–7.7)
Neutrophils Relative %: 74 %
Platelets: 371 10*3/uL (ref 150–400)
RBC: 4.36 MIL/uL (ref 4.22–5.81)
RDW: 13.3 % (ref 11.5–15.5)
WBC: 13.2 10*3/uL — ABNORMAL HIGH (ref 4.0–10.5)
nRBC: 0 % (ref 0.0–0.2)

## 2019-08-25 LAB — LIPASE, BLOOD: Lipase: 23 U/L (ref 11–51)

## 2019-08-25 LAB — LACTIC ACID, PLASMA: Lactic Acid, Venous: 1.3 mmol/L (ref 0.5–1.9)

## 2019-08-25 LAB — PROCALCITONIN: Procalcitonin: <0.1 ng/mL

## 2019-08-25 LAB — MAGNESIUM: Magnesium: 1.9 mg/dL (ref 1.7–2.4)

## 2019-08-25 MED ORDER — SODIUM CHLORIDE 0.9 % IV SOLN: INTRAVENOUS | Status: AC

## 2019-08-25 MED ORDER — POLYETHYLENE GLYCOL 3350 17 G PO PACK
17.0000 g | PACK | Freq: Every day | ORAL | Status: DC
  Administered 2019-08-25 – 2019-08-27 (×3): 17 g via ORAL
  Filled 2019-08-25 (×3): qty 1

## 2019-08-25 NOTE — Progress Notes (Signed)
PROGRESS NOTE  Justin Chaney [id number] DOB: May 20, 1996 DOA: 08/10/2019 PCP: Patient, No Pcp Per  HPI/Recap of past 49 hours: 24 year old gentleman with history of bipolar disorder, VP shunt, schizophrenia and IV drug use admitted to hospital with fever chills myalgia, frontal headache and shortness of breath of 1 week.  In the emergency room he was febrile with temperature 103, tachycardic 130, blood pressure low normal responded to fluid.  Lactic acidosis and platelets of 42.  Patient was empirically treated with antibiotics.  Blood cultures with MSSA and echocardiogram with endocarditis.  He is on IV antibiotics for MSSA bacteremia and right-sided endocarditis.  Ancef from 08/16/2019 through 08/30/2019 then ID will consider long-acting infusion.  In-house due to history of IV drug abuse.  Had a fall 1/22.  He reports he felt dizzy, felt like he was about to pass out prior to his fall.  Denies hitting his head.  Denies any pain this morning.  Orthostatic vital signs and twelve-lead EKG ordered.  Patient had refused EKG.  Febrile with Tmax 102.4.  And leukocytosis with WBC 13 K.  Blood cultures obtained peripherally.  Discussed with infectious disease Dr. Linus Salmons.  This could happen in the setting of tricuspid endocarditis.  We will continue to follow cultures.  Procalcitonin and lactic acid negative.  08/25/19: Seen and examined.  Denies abdominal pain although admits to nausea and vomiting last night.  Denies dysuria.  Admits to nonproductive intermittent cough.  Chest x-ray and abdominal x-ray obtained.    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  Drawn within 24 hours. Repeat blood cultures, 08/16/2019, negative so far. Clinically improving.  Sepsis resolving. Initially treated with broad-spectrum, now on Ancef.              Ancef from 08/16/2019 through 08/30/2019 then ID will consider long-acting infusion.  In-house due to history of IV drug abuse.  Recurrent fever and leukocytosis in the setting of MSSA bacteremia with tricuspid endocarditis Blood cultures ordered peripherally x2, continue to follow cultures Procalcitonin lactic acid so far negative Continue to monitor WBC and fever curve.  Discussed with infectious disease Dr. Linus Salmons 1/23.  Not unlikely to see recurrent fever and leukocytosis in the setting of R sided endocarditis.               Right suprahilar region opacity, unclear etiology Independently reviewed chest x-ray done on 08/25/2019 which showed right suprahilar hilar opacity. Patient is on Ancef for MSSA bacteremia.  Procalcitonin and lactic acid negative. Cultures have been drawn, continue to follow.  Obtain sputum culture if able, may need to be induced.  Transient anemia thrombocytopenia: Likely 2/2 to acute illness  History of drug use: Patient has been using heroin recently.  No evidence of withdrawal.  Does not want any treatment.  Patient states that he can take care of himself.  Schizophrenia: Not taking medicine for a while.  Seen by psychiatry.  Started on Seroquel and Haldol at night.  Fall  in the hospital Possibly orthosthatic hypotension Orthosthatic vital signs pending Has declined 12 lead EKG C/w IV fluid NS at 75cc/hr Continue to monitor w fall precautions PT to assess  Chronic constipation Continue bowel regimen.   DVT prophylaxis: Lovenox subcu daily. Code  Status: Full code Family Communication: None Disposition Plan: Remains in the hospital.  Not an outpatient infusion candidate due to active drug abuse.   Consultants:   Infectious disease  Psychiatry  Procedures:   None  Antimicrobials:    Objective: Vitals:   08/25/19 0147 08/25/19 0517 08/25/19 0903 08/25/19 1306  BP: 119/68 (!) 116/97 (!) 108/54 110/71  Pulse: 89 97 (!) 114 (!) 107  Resp: 18 18 (!) 22 20  Temp: 98.4 F (36.9 C) 98.8 F (37.1 C) 99.6 F (37.6 C) 98.7 F (37.1 C)  TempSrc: Oral Oral Oral Oral  SpO2: 97% 95% 98% 100%  Weight:      Height:        Intake/Output Summary (Last 24 hours) at 08/25/2019 1404 Last data filed at 08/25/2019 0600 Gross per 24 hour  Intake 1845.25 ml  Output 100 ml  Net 1745.25 ml   Filed Weights   08/12/19 0000  Weight: 103.4 kg    Exam:  No changes from prior . General: 24 y.o. year-old male Well-nourished no acute distress.  Alert and oriented x3. . Cardiovascular: Regular rate and rhythm no rubs or gallops. Marland Kitchen Respiratory: Clear to auscultation no wheezes or rales.   . Abdomen: Soft nontender normal bowel sounds present. . Musculoskeletal: No lower extremity edema bilaterally. Psychiatry: Mood is appropriate for condition and setting.   Data Reviewed: CBC: Recent Labs  Lab 08/23/19 0555 08/25/19 0536  WBC 11.8* 13.2*  NEUTROABS  --  9.8*  HGB 12.1* 11.8*  HCT 39.5 38.7*  MCV 89.0 88.8  PLT 434* [id number]   Basic Metabolic Panel: Recent Labs  Lab 08/23/19 0555 08/25/19 0536  NA 138 137  K 4.2 3.8  CL 100 101  CO2 27 27  GLUCOSE 109* 120*  BUN 11 7  CREATININE 0.77 0.85  CALCIUM 9.5 9.2  MG  --  1.9   GFR: Estimated Creatinine Clearance: 139.2 mL/min (by C-G formula based on SCr of 0.85 mg/dL). Liver Function Tests: Recent Labs  Lab 08/25/19 0536  AST 20  ALT 25  ALKPHOS 92  BILITOT 0.9  PROT 7.7  ALBUMIN 3.3*   Recent Labs  Lab 08/25/19 0536  LIPASE 23   No results for input(s):  AMMONIA in the last 168 hours. Coagulation Profile: No results for input(s): INR, PROTIME in the last 168 hours. Cardiac Enzymes: No results for input(s): CKTOTAL, CKMB, CKMBINDEX, TROPONINI in the last 168 hours. BNP (last 3 results) No results for input(s): PROBNP in the last 8760 hours. HbA1C: No results for input(s): HGBA1C in the last 72 hours. CBG: No results for input(s): GLUCAP in the last 168 hours. Lipid Profile: No results for input(s): CHOL, HDL, LDLCALC, TRIG, CHOLHDL, LDLDIRECT in the last 72 hours. Thyroid Function Tests: No results for input(s): TSH, T4TOTAL, FREET4, T3FREE, THYROIDAB in the last 72 hours. Anemia Panel: No results for input(s): VITAMINB12, FOLATE, FERRITIN, TIBC, IRON, RETICCTPCT in the last 72 hours. Urine analysis:    Component Value Date/Time   COLORURINE YELLOW 08/10/2019 [place] 08/10/2019 1615   LABSPEC 1.018 08/10/2019 1615   PHURINE 7.0 08/10/2019 1615   GLUCOSEU NEGATIVE 08/10/2019 1615   HGBUR SMALL (A) 08/10/2019 1615   BILIRUBINUR NEGATIVE 08/10/2019  [place] 08/10/2019 [place] 08/10/2019 1615   UROBILINOGEN 2.0 (H) 04/01/2014 2200   NITRITE NEGATIVE 08/10/2019 1615   LEUKOCYTESUR NEGATIVE 08/10/2019 1615   Sepsis Labs: @LABRCNTIP (procalcitonin:4,lacticidven:4)  ) Recent Results (from the past 240 hour(s))  Culture, blood (routine x 2)     Status: None   Collection Time: 08/16/19  9:19 AM   Specimen: BLOOD RIGHT HAND  Result Value Ref Range Status   Specimen Description   Final    BLOOD RIGHT HAND Performed at [facility], [place] [address] [place], [place] [address]    Special Requests   Final    BOTTLES DRAWN AEROBIC AND ANAEROBIC Blood Culture adequate volume Performed at [facility] [address] [place], [place] [address]    Culture   Final    NO GROWTH 5 DAYS Performed at [facility], [place] [address],  [place],  [address]    Report Status 08/21/2019 FINAL  Final      Studies: DG CHEST PORT 1 VIEW  Result Date: 08/25/2019 CLINICAL DATA:  Cough.  Endocarditis. EXAM: PORTABLE CHEST 1 VIEW COMPARISON:  08/10/2019 FINDINGS: New airspace densities in the right suprahilar region. Findings are suggestive for pneumonia. Left lung is clear. Heart size is within normal limits and stable. The trachea is midline. IMPRESSION: New opacities in the medial right upper lung and right suprahilar region. Findings are suggestive for pneumonia. Electronically Signed   By: Markus Daft M.D.   On: 08/25/2019 10:38   DG Abd Portable 1V  Result Date: 08/25/2019 CLINICAL DATA:  Nausea and vomiting EXAM: PORTABLE ABDOMEN - 1 VIEW COMPARISON:  None. FINDINGS: Bowel gas pattern is nonobstructive. Moderate amount of stool in the nondistended colon. No evidence of soft tissue mass or abnormal fluid collection. No evidence of free intraperitoneal air. No evidence of renal or ureteral calculi. Visualized osseous structures are unremarkable. IMPRESSION: Nonobstructive bowel gas pattern. Moderate amount of stool in the nondistended colon. Electronically Signed   By: Franki Cabot M.D.   On: 08/25/2019 10:40    Scheduled Meds: . enoxaparin (LOVENOX) injection  40 mg Subcutaneous Q24H  . haloperidol  2 mg Oral QHS  . mouth rinse  15 mL Mouth Rinse BID  . polyethylene glycol  17 g Oral Daily  . potassium & sodium phosphates  1 packet Oral TID WC & HS  . QUEtiapine  300 mg Oral QHS  . senna-docusate  2 tablet Oral BID    Continuous Infusions: . sodium chloride Stopped (08/17/19 2149)  . sodium chloride 75 mL/hr at 08/25/19 0508  .  ceFAZolin (ANCEF) IV 2 g (08/25/19 1050)     LOS: 14 days     Kayleen Memos, MD [facility] Pager [phone]  If 7PM-7AM, please contact night-coverage [email or web address] Password [facility] 08/25/2019, 2:04 PM

## 2019-08-25 NOTE — Progress Notes (Signed)
   Vital Signs MEWS/VS Documentation      08/25/2019 0858 08/25/2019 0903 08/25/2019 1306 08/25/2019 1532   MEWS Score:  0  3  1  0   MEWS Score Color:  Green  Yellow  Green  Green   Resp:  --  (!) 22  20  --   Pulse:  --  (!) 114  (!) 107  93   BP:  --  (!) 108/54  110/71  110/63   Temp:  --  99.6 F (37.6 C)  98.7 F (37.1 C)  98.8 F (37.1 C)   O2 Device:  --  Room Air  Room Air  --   Level of Consciousness:  Alert  --  --  --           Eston Mould 08/25/2019,0905  Currently following red MEWS protocol MD is aware

## 2019-08-25 NOTE — Progress Notes (Signed)
PT Cancellation Note  Patient Details Name: Justin Chaney MRN: [id number] DOB: 1996/03/04   Cancelled Treatment:    Reason Eval/Treat Not Completed: PT screened, no needs identified, will sign off. Pt states he does not need PT,  has been am bin hallway without difficulty today. RN confirms   [facility] 08/25/2019, 2:32 PM

## 2019-08-26 ENCOUNTER — Inpatient Hospital Stay (HOSPITAL_COMMUNITY): Payer: Medicaid Other

## 2019-08-26 ENCOUNTER — Inpatient Hospital Stay: Payer: Self-pay

## 2019-08-26 LAB — CBC WITH DIFFERENTIAL/PLATELET
Abs Immature Granulocytes: 0.06 10*3/uL (ref 0.00–0.07)
Basophils Absolute: 0.1 10*3/uL (ref 0.0–0.1)
Basophils Relative: 1 %
Eosinophils Absolute: 0 10*3/uL (ref 0.0–0.5)
Eosinophils Relative: 0 %
HCT: 34.2 % — ABNORMAL LOW (ref 39.0–52.0)
Hemoglobin: 10.7 g/dL — ABNORMAL LOW (ref 13.0–17.0)
Immature Granulocytes: 1 %
Lymphocytes Relative: 13 %
Lymphs Abs: 1.4 10*3/uL (ref 0.7–4.0)
MCH: 27.3 pg (ref 26.0–34.0)
MCHC: 31.3 g/dL (ref 30.0–36.0)
MCV: 87.2 fL (ref 80.0–100.0)
Monocytes Absolute: 1.3 10*3/uL — ABNORMAL HIGH (ref 0.1–1.0)
Monocytes Relative: 12 %
Neutro Abs: 7.9 10*3/uL — ABNORMAL HIGH (ref 1.7–7.7)
Neutrophils Relative %: 73 %
Platelets: 318 10*3/uL (ref 150–400)
RBC: 3.92 MIL/uL — ABNORMAL LOW (ref 4.22–5.81)
RDW: 13.3 % (ref 11.5–15.5)
WBC: 10.8 10*3/uL — ABNORMAL HIGH (ref 4.0–10.5)
nRBC: 0 % (ref 0.0–0.2)

## 2019-08-26 LAB — TROPONIN I (HIGH SENSITIVITY): Troponin I (High Sensitivity): 3 ng/L (ref <18)

## 2019-08-26 LAB — D-DIMER, QUANTITATIVE: D-Dimer, Quant: 1.38 ug/mL-FEU — ABNORMAL HIGH (ref 0.00–0.50)

## 2019-08-26 LAB — FIBRINOGEN: Fibrinogen: 752 mg/dL — ABNORMAL HIGH (ref 210–475)

## 2019-08-26 MED ORDER — SODIUM CHLORIDE 0.9 % IV SOLN: INTRAVENOUS | Status: DC

## 2019-08-26 MED ORDER — TRAMADOL HCL 50 MG PO TABS
50.0000 mg | ORAL_TABLET | Freq: Once | ORAL | Status: AC
  Administered 2019-08-26: 50 mg via ORAL
  Filled 2019-08-26: qty 1

## 2019-08-26 MED ORDER — IBUPROFEN 200 MG PO TABS
600.0000 mg | ORAL_TABLET | Freq: Once | ORAL | Status: AC
  Administered 2019-08-26: 600 mg via ORAL
  Filled 2019-08-26: qty 3

## 2019-08-26 MED ORDER — SODIUM CHLORIDE 0.9 % IV BOLUS
500.0000 mL | Freq: Once | INTRAVENOUS | Status: AC
  Administered 2019-08-26: 23:00:00 500 mL via INTRAVENOUS

## 2019-08-26 MED ORDER — MORPHINE SULFATE (PF) 2 MG/ML IV SOLN
2.0000 mg | INTRAVENOUS | Status: AC | PRN
  Administered 2019-08-26 – 2019-08-27 (×3): 2 mg via INTRAVENOUS
  Filled 2019-08-26 (×3): qty 1

## 2019-08-26 MED ORDER — TECHNETIUM TO 99M ALBUMIN AGGREGATED
1.5000 | Freq: Once | INTRAVENOUS | Status: AC | PRN
  Administered 2019-08-26: 19:00:00 1.5 via INTRAVENOUS

## 2019-08-26 MED ORDER — ENSURE ENLIVE PO LIQD
237.0000 mL | Freq: Two times a day (BID) | ORAL | Status: DC
  Administered 2019-08-30 – 2019-09-04 (×5): 237 mL via ORAL

## 2019-08-26 NOTE — Progress Notes (Signed)
Writer paged MD, regarding HR 152. Pt c/o chest pain. RR called.

## 2019-08-26 NOTE — Progress Notes (Signed)
Attempted to call RN, no answer at this time.  PICC plan to be placed 08/27/19.

## 2019-08-26 NOTE — Progress Notes (Signed)
This RN spoke with pt regarding need to place PIV/Midline/PICC  for CT scan per MD order. Pt refused and stated " I am not getting stuck anymore. Y'all are not putting anything else in me. I am fine." Explained to pt why he needed the access and that this was in his best interest. Pt then asked this RN to leave his room. Raquel Sarna, RN notified.  Randol Kern, RN VAST

## 2019-08-26 NOTE — Progress Notes (Signed)
Patient refused EKG. Patient cursing at [place] while checking his heart rate. Patient heart rate remaining 114-116. MD Dr. Nevada Crane paged new orders regarding patient red mews

## 2019-08-26 NOTE — Progress Notes (Signed)
Consult was placed to IV Therapy to place an 18 or 20ga catheter for a CT Angio;  Pt was upset saying "I already have an iv in my hand;"  Explained to the pt that the iv in his hand was not large enough for the CT.  Ultrasound was used, and an IV was attempted in the Left upper arm-  until the pt grabbed at the catheter and ultrasound probe;  This Probation officer called for assistance;  2 more attempts were made using the ultrasound, however the pt is extremely anxious and tenses up;  Severe scar tissue noted on all 3 attempts; pt upset because the catheters will not thread due to scar tissue;  NO further attempts made;  RN has called anesthesia dept to assess this pt.

## 2019-08-26 NOTE — Progress Notes (Signed)
Patient also refusing to placed on Tele. He stated He doesn't want anyone watching him. Patient was educated regarding tele services. He continues to refuse

## 2019-08-26 NOTE — Significant Event (Signed)
Rapid Response Event Note  Overview: Time Called: [id number] Arrival Time: 1640 Event Type: Cardiac  Initial Focused Assessment: Pt resting in bed complaints of "tearing" type pain in chest 6/10. States pain does not radiate and has been off and on since "the other night". Pt reports the pain is worse with cough and deep breaths. Pt refused 12 lead EKG. He did allow rapid to monitor heart rate and rhythm on rapid cart. Pt ST 110s-120s. MD to bedside and explained to patient need for CTA to check for PE. Pt voiced he was tired of being stuck by needles, didn't want labs or tests done, and was upset that he was told his infection was gone and now told it's still here.  Anesthesia came to bedside to attempt IV and was also unsuccessful. Rapid RN remained at bedside and encouraged pt to wear continuous cardiac monitoring. Pt did agree at the end of visit. Rapid also educated patient on PICC line and encouraged the patient to give it consideration in order to help with tests, meds, and labs needed for the hospitalization. Pt voiced he would think about it.   Plan of Care (if not transferred): Pt remains in 1505- now cardiac monitored        Wray Kearns, RN

## 2019-08-26 NOTE — Progress Notes (Signed)
   08/26/19 2141  MEWS Score  Temp (!) 102.8 F (39.3 C) (nurse notified)  BP 100/64  Pulse Rate (!) 121 (nurse notified)  Resp 18  SpO2 96 %  O2 Device Room Air  MEWS Score  MEWS Temp 2  MEWS Systolic 1  MEWS Pulse 2  MEWS RR 0  MEWS LOC 0  MEWS Score 5  MEWS Score Color Red  MEWS Assessment  Is this an acute change? No (fever )  MEWS guidelines implemented *See Row Information* Red  Rapid Response Notification  Name of Rapid Response RN Notified Pamala Hurry   Date Rapid Response Notified 08/26/19  Time Rapid Response Notified 2150  Provider Notification  Provider Name/Title Bodenheimer NP   Date Provider Notified 08/26/19  Time Provider Notified 2150  Notification Type Page  Notification Reason Change in status  Response See new orders  Date of Provider Response 08/26/19  Time of Provider Response 2206  Pt alert and active in room. HR does jump up to 150s when pt ambulating but sustains 120s at rest. Pt c/o pain, on call paged and new orders received. Additional PRN given for fever, see MAR.

## 2019-08-26 NOTE — Progress Notes (Signed)
PROGRESS NOTE  Justin Chaney [id number] DOB: 20-Aug-1995 DOA: 08/10/2019 PCP: Patient, No Pcp Per  HPI/Recap of past 59 hours: 24 year old gentleman with history of bipolar disorder, VP shunt, schizophrenia and IV drug use admitted to hospital with fever chills myalgia, frontal headache and shortness of breath of 1 week.  In the emergency room he was febrile with temperature 103, tachycardic 130, blood pressure low normal responded to fluid.  Lactic acidosis and platelets of 42.  Patient was empirically treated with antibiotics.  Blood cultures with MSSA and echocardiogram with endocarditis.  He is on IV antibiotics for MSSA bacteremia and right-sided endocarditis.  Ancef from 08/16/2019 through 08/30/2019 then ID will consider long-acting infusion.  In-house due to history of IV drug abuse.  Had a fall 1/22.  He reports he felt dizzy, felt like he was about to pass out prior to his fall.  Denies hitting his head.  Denies any pain this morning.  Orthostatic vital signs and twelve-lead EKG ordered.  Patient had refused EKG.  Febrile with Tmax 102.4.  And leukocytosis with WBC 13 K.  Blood cultures obtained peripherally.  Discussed with infectious disease Dr. Linus Salmons.  This could happen in the setting of tricuspid endocarditis.  We will continue to follow cultures.  Procalcitonin and lactic acid negative.  08/26/19: Seen and examined. Reports pleuritic pain worse when he attempts to take a deep breath. Tachycardic. Mild drop in O2 saturation down to 96% RA from 100% RA at baseline. CTA chest ordered to rule out PE. Unfortunately patient has poor peripheral IV access, working on establishing IV access for contrast.    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 Repeat blood cultures, 08/16/2019, negative Final. Initially treated with broad-spectrum, on Ancef.              ID recommended Ancef from 08/16/2019 through 08/30/2019 then ID will consider long-acting infusion or oral linezolid. Completing IV antibiotics in-house due to history of IV drug abuse.  Recurrent fever and leukocytosis in the setting of MSSA bacteremia with tricuspid endocarditis Blood cultures drawn on 08/25/19 peripherally in process. Continue to follow cultures. Procalcitonin and lactic acid negative on 2019-08-25. Continue to monitor WBC and fever curve.  Discussed with infectious disease Dr. Linus Salmons 1/23.  Not unlikely to see recurrent fever and leukocytosis in the setting of R sided endocarditis.  Pleuritic chest pain Reports pleuritic pain worse when he attempts to take a deep breath. Tachycardic. Mild drop in O2 saturation down to 96% RA from 100% RA at baseline. Rule out PE CTA chest ordered on 2019-08-26 to rule out PE Unfortunately patient has poor peripheral IV access, working on establishing IV access for contrast.               Right suprahilar region opacity on chest x-ray Independently reviewed chest x-ray done on 08/25/2019 which showed right suprahilar hilar opacity. Patient is on Ancef for MSSA bacteremia.  Procalcitonin and lactic acid negative. Cultures have been drawn, continue to  follow.  Obtain sputum culture if able, may need to be induced. WBC trending down. Afebrile overnight. Continue Ancef.  Medical noncompliance Patient has intermittently refused chemical DVT prophylaxis Lovenox subcu daily Also declined EKG and others  intermittently.  Transient anemia thrombocytopenia: Likely 2/2 to acute illness  History of drug use: Patient has been using heroin recently.  No evidence of withdrawal.  Does not want any treatment.  Patient states that he can take care of himself.  Schizophrenia: Not taking medicine for a while.  Seen by psychiatry.  Started on Seroquel and Haldol at night.  Fall in the hospital Possibly orthosthatic hypotension Orthosthatic vital signs pending Has declined 12 lead EKG Continue to monitor w fall precautions PT to assess. Patient declines stating he does not need PT.  Chronic constipation Continue bowel regimen.   DVT prophylaxis: Lovenox subcu daily. Code Status: Full code Family Communication: None Disposition Plan: Remains in the hospital.  Not an outpatient infusion candidate due to active drug abuse.   Consultants:   Infectious disease  Psychiatry  Procedures:   None  Antimicrobials:                Ancef  Objective: Vitals:   08/25/19 1306 08/25/19 1532 08/25/19 2125 08/26/19 0532  BP: 110/71 110/63 (!) 109/52 (!) 113/56  Pulse: (!) 107 93 (!) 102 (!) 103  Resp: 20 20 16 15   Temp: 98.7 F (37.1 C) 98.8 F (37.1 C) 99.5 F (37.5 C) 99.3 F (37.4 C)  TempSrc: Oral Oral Oral Oral  SpO2: 100% 100% 98% 96%  Weight:      Height:        Intake/Output Summary (Last 24 hours) at 08/26/2019 0949 Last data filed at 08/25/2019 2134 Gross per 24 hour  Intake 659.53 ml  Output 750 ml  Net -90.47 ml   Filed Weights   08/12/19 0000  Weight: 103.4 kg    Exam:  No changes from prior . General: 24 y.o. year-old male well-developed well-nourished in no acute distress. Appears uncomfortable when he takes a deep breath. Alert and oriented x3. . Cardiovascular: Tachycardic with no rubs or gallops. Marland Kitchen Respiratory: Clear to auscultation no wheezes or rales. . Abdomen: Normal bowel sounds present. Nontender.  . Musculoskeletal: No lower extremity edema  bilaterally. Marland Kitchen Psychiatry: Mood is appropriate for condition and setting.  Data Reviewed: CBC: Recent Labs  Lab 08/23/19 0555 08/25/19 0536 08/26/19 0538  WBC 11.8* 13.2* 10.8*  NEUTROABS  --  9.8* 7.9*  HGB 12.1* 11.8* 10.7*  HCT 39.5 38.7* 34.2*  MCV 89.0 88.8 87.2  PLT 434* 371 0000000   Basic Metabolic Panel: Recent Labs  Lab 08/23/19 0555 08/25/19 0536  NA 138 137  K 4.2 3.8  CL 100 101  CO2 27 27  GLUCOSE 109* 120*  BUN 11 7  CREATININE 0.77 0.85  CALCIUM 9.5 9.2  MG  --  1.9   GFR: Estimated Creatinine Clearance: 139.2 mL/min (by C-G formula based on SCr of 0.85 mg/dL). Liver Function Tests: Recent Labs  Lab 08/25/19 0536  AST 20  ALT 25  ALKPHOS 92  BILITOT 0.9  PROT 7.7  ALBUMIN 3.3*   Recent Labs  Lab 08/25/19 0536  LIPASE 23   No results for input(s): AMMONIA in the last 168 hours. Coagulation Profile: No results for input(s): INR, PROTIME in the last 168 hours. Cardiac Enzymes: No results for input(s): CKTOTAL, CKMB, CKMBINDEX, TROPONINI in the last 168 hours. BNP (last 3 results) No results for  input(s): PROBNP in the last 8760 hours. HbA1C: No results for input(s): HGBA1C in the last 72 hours. CBG: No results for input(s): GLUCAP in the last 168 hours. Lipid Profile: No results for input(s): CHOL, HDL, LDLCALC, TRIG, CHOLHDL, LDLDIRECT in the last 72 hours. Thyroid Function Tests: No results for input(s): TSH, T4TOTAL, FREET4, T3FREE, THYROIDAB in the last 72 hours. Anemia Panel: No results for input(s): VITAMINB12, FOLATE, FERRITIN, TIBC, IRON, RETICCTPCT in the last 72 hours. Urine analysis:    Component Value Date/Time   COLORURINE YELLOW 08/10/2019 Outlook 08/10/2019 1615   LABSPEC 1.018 08/10/2019 1615   PHURINE 7.0 08/10/2019 1615   GLUCOSEU NEGATIVE 08/10/2019 1615   HGBUR SMALL (A) 08/10/2019 1615   BILIRUBINUR NEGATIVE 08/10/2019 1615   KETONESUR NEGATIVE 08/10/2019 1615   PROTEINUR NEGATIVE 08/10/2019  1615   UROBILINOGEN 2.0 (H) 04/01/2014 2200   NITRITE NEGATIVE 08/10/2019 1615   LEUKOCYTESUR NEGATIVE 08/10/2019 1615   Sepsis Labs: @LABRCNTIP (procalcitonin:4,lacticidven:4)  ) No results found for this or any previous visit (from the past 240 hour(s)).    Studies: No results found.  Scheduled Meds: . enoxaparin (LOVENOX) injection  40 mg Subcutaneous Q24H  . haloperidol  2 mg Oral QHS  . mouth rinse  15 mL Mouth Rinse BID  . polyethylene glycol  17 g Oral Daily  . potassium & sodium phosphates  1 packet Oral TID WC & HS  . QUEtiapine  300 mg Oral QHS  . senna-docusate  2 tablet Oral BID    Continuous Infusions: . sodium chloride Stopped (08/17/19 2149)  .  ceFAZolin (ANCEF) IV 2 g (08/26/19 0917)     LOS: 15 days     Kayleen Memos, MD [facility] Pager [phone]  If 7PM-7AM, please contact night-coverage [email or web address] Password [facility] 08/26/2019, 9:49 AM

## 2019-08-26 NOTE — Progress Notes (Signed)
Writer went in patients room @ 1801 to obtain Vital signs. Patient refused his vital signs. States he just had them done

## 2019-08-26 NOTE — Progress Notes (Signed)
Rapid response was called patient c/o chest pain and remained in red mews. Dr Nevada Crane was paged. Dr. Nevada Crane came up and saw patient. Nurse from anesthesia team in the room inserting an IV in patient for his CT

## 2019-08-26 NOTE — Progress Notes (Signed)
Writer called anesthesia in OR for an IV (#18 or #20) for CT angio. Will await one of the anesthesiologist to try to come up and help. IV team has attempted IV start 3 times this morning without success.

## 2019-08-27 ENCOUNTER — Inpatient Hospital Stay (HOSPITAL_COMMUNITY): Payer: Medicaid Other

## 2019-08-27 DIAGNOSIS — I2602 Saddle embolus of pulmonary artery with acute cor pulmonale: Secondary | ICD-10-CM

## 2019-08-27 LAB — ECHOCARDIOGRAM LIMITED
Height: 62 in
Weight: 3647.29 oz

## 2019-08-27 MED ORDER — SODIUM CHLORIDE 0.9 % IV BOLUS
1000.0000 mL | Freq: Once | INTRAVENOUS | Status: AC
  Administered 2019-08-27: 1000 mL via INTRAVENOUS

## 2019-08-27 MED ORDER — HYDROMORPHONE HCL 1 MG/ML IJ SOLN
0.5000 mg | Freq: Once | INTRAMUSCULAR | Status: AC
  Administered 2019-08-27: 0.5 mg via INTRAVENOUS
  Filled 2019-08-27: qty 0.5

## 2019-08-27 MED ORDER — ACETAMINOPHEN 325 MG PO TABS
650.0000 mg | ORAL_TABLET | ORAL | Status: DC | PRN
  Administered 2019-08-28 – 2019-08-31 (×6): 650 mg via ORAL
  Filled 2019-08-27 (×7): qty 2

## 2019-08-27 MED ORDER — APIXABAN 5 MG PO TABS: 5.0000 mg | ORAL_TABLET | Freq: Two times a day (BID) | ORAL | Status: DC

## 2019-08-27 MED ORDER — ACETAMINOPHEN 650 MG RE SUPP
650.0000 mg | RECTAL | Status: DC | PRN
  Filled 2019-08-27: qty 1

## 2019-08-27 MED ORDER — MORPHINE SULFATE (PF) 2 MG/ML IV SOLN
2.0000 mg | INTRAVENOUS | Status: AC | PRN
  Administered 2019-08-27 – 2019-08-28 (×3): 2 mg via INTRAVENOUS
  Filled 2019-08-27 (×3): qty 1

## 2019-08-27 MED ORDER — PANTOPRAZOLE SODIUM 40 MG IV SOLR
40.0000 mg | Freq: Two times a day (BID) | INTRAVENOUS | Status: DC
  Administered 2019-08-27 – 2019-08-29 (×4): 40 mg via INTRAVENOUS
  Filled 2019-08-27 (×4): qty 40

## 2019-08-27 MED ORDER — APIXABAN 5 MG PO TABS
10.0000 mg | ORAL_TABLET | Freq: Two times a day (BID) | ORAL | Status: DC
  Administered 2019-08-27: 14:00:00 10 mg via ORAL
  Filled 2019-08-27 (×2): qty 2

## 2019-08-27 NOTE — Progress Notes (Signed)
Patient has consistently and adamantly refused a picc line.  Patient is also refusing Ct scan and x ray as well.  Patient is aware of risks involved but still declines treatment.

## 2019-08-27 NOTE — Progress Notes (Signed)
Discussed with patient in length why he needs to get picc line and tests.  Patient states that he is going home on Thursday and so he doesn't want picc line.  Explained to patient that his iv will not last forever and that picc line would prevent him from getting stuck as much.  Patient did not care and stated that he doesn't want another infection.  Patient states that he doesn't want anymore scan because he says he has had enough done and if there was any blood clots we would have seen them.  Explained to patient that the tests done before weren't specific to detect blood clots.  Patient agreed to let MD talk to family but stated that he didn't think they would answer.

## 2019-08-27 NOTE — Progress Notes (Signed)
  Echocardiogram 2D Echocardiogram limited has been performed.  Bobbye Charleston 08/27/2019, 11:11 AM

## 2019-08-27 NOTE — Progress Notes (Signed)
   Vital Signs MEWS/VS Documentation      08/27/2019 0521 08/27/2019 0700 08/27/2019 0939 08/27/2019 1409   MEWS Score:  0  0  1  3   MEWS Score Color:  Green  Green  Green  Yellow   Resp:  18  --  16  18   Pulse:  88  --  (!) 104  (!) 123   BP:  111/65  --  110/67  (!) 105/54   Temp:  98.2 F (36.8 C)  --  98.7 F (37.1 C)  (!) 101.2 F (38.4 C)   O2 Device:  Room Air  --  Room Air  --   Level of Consciousness:  --  --  Alert  Alert       Patient spiked another fever.  Dr. Nevada Crane made aware, who has spoke with infectious disease Dr. Baxter Flattery who said to continue current regimen.  Patient given tylenol and ice put underneath his arms.    Teryl Mcconaghy, Kathryne Hitch 08/27/2019,2:27 PM

## 2019-08-27 NOTE — Progress Notes (Signed)
2300 Notified per charge nurse that patients MEWS score is 5. Visually seen patient, on call provider notified new orders received. Will continue to monitor per MEWS protocol.

## 2019-08-27 NOTE — Progress Notes (Addendum)
ANTICOAGULATION CONSULT NOTE - Initial Consult  Pharmacy Consult for apixaban Indication: pulmonary embolus  Allergies  Allergen Reactions  . Amoxicillin Hives and Other (See Comments)    CHILDHOOD ALLERGY - Per [facility] records, tolerates Ancef and Rocephin Has patient had a PCN reaction causing immediate rash, facial/tongue/throat swelling, SOB or lightheadedness with hypotension: YES Has patient had a PCN reaction causing severe rash involving mucus membranes or skin necrosis: No Has patient had a PCN reaction that required hospitalization No    Patient Measurements: Height: 5\' 2"  (157.5 cm) Weight: 227 lb 15.3 oz (103.4 kg) IBW/kg (Calculated) : 54.6   Vital Signs: Temp: 98.7 F (37.1 C) (01/25 0939) Temp Source: Oral (01/25 0521) BP: 110/67 (01/25 0939) Pulse Rate: 104 (01/25 0939)  Labs: Recent Labs    08/25/19 0536 08/26/19 0538 08/26/19 1656  HGB 11.8* 10.7*  --   HCT 38.7* 34.2*  --   PLT 371 318  --   CREATININE 0.85  --   --   TROPONINIHS  --   --  3    Estimated Creatinine Clearance: 139.2 mL/min (by C-G formula based on SCr of 0.85 mg/dL).   Medical History: Past Medical History:  Diagnosis Date  . Asthma   . Bipolar disorder ([place])   . 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 Depression   . Homelessness   . Schizophrenia ([place])   . Seizures ([place])    childhood seizures  . Suicidal behavior   . Suicidal intent       Assessment: 24 year old gentleman with history of bipolar disorder, VP shunt, schizophrenia and IV drug use admitted to hospital with fever chills myalgia, frontal headache and shortness of breath of 1 week.  Echo could not r/o pulmonary embolism, pharmacy to dose eliquis.  CBC and Scr WNL  Goal of Therapy:  apixaban per indication and renal function   Plan:  -eliquis 10mg  po twice daily for 7 days then decrease to 5mg  tablet twice daily thereafter - f/u sign and symptoms of bleeding - pharmacy to provide  education  Dolly Rias RPh 08/27/2019, 1:20 PM

## 2019-08-27 NOTE — Progress Notes (Addendum)
[place] for Infectious Disease    Date of Admission:  08/10/2019   Total days of antibiotics 18           ID: Justin Chaney is a 24 y.o. male with right sided endocarditis with mssa know with pulmonary septic emboli Principal Problem:   Staphylococcus aureus bacteremia Active Problems:   Schizoaffective disorder, bipolar type (HCC)   Tobacco use   SIRS (systemic inflammatory response syndrome) (HCC)   Normocytic anemia   Thrombocytopenia (HCC)   Polysubstance abuse (HCC)   IVDU (intravenous drug user)   S/P VP shunt   Transaminitis   Hyperbilirubinemia   Sepsis with acute organ dysfunction without septic shock (HCC)    Subjective: Intermittent fevers, with cxr suggesting worsening pneumonia. Also having tachycardia/new onset hypoxia - which VQ scan suggested PE due to wedge shaped findings. Patient has poor iv access thus unable to get ct-a. He reports that he notices chest pain when he coughs. He wants to get rid of this infection  Medications:   apixaban  10 mg Oral BID   Followed by   Derrill Memo ON 09/03/2019] apixaban  5 mg Oral BID   feeding supplement (ENSURE ENLIVE)  237 mL Oral BID BM   haloperidol  2 mg Oral QHS   mouth rinse  15 mL Mouth Rinse BID   polyethylene glycol  17 g Oral Daily   potassium & sodium phosphates  1 packet Oral TID WC & HS   QUEtiapine  300 mg Oral QHS   senna-docusate  2 tablet Oral BID    Objective: Vital signs in last 24 hours: Temp:  [97.7 F (36.5 C)-102.8 F (39.3 C)] 101.2 F (38.4 C) (01/25 1409) Pulse Rate:  [87-152] 123 (01/25 1409) Resp:  [16-24] 18 (01/25 1409) BP: (98-135)/(54-87) 105/54 (01/25 1409) SpO2:  [95 %-100 %] 95 % (01/25 0939) Physical Exam  Constitutional: He is oriented to person, place, and time. He appears well-developed and well-nourished. No distress.  HENT:  Mouth/Throat: Oropharynx is clear and moist. No oropharyngeal exudate.  Cardiovascular: Normal rate, regular rhythm and normal  heart sounds. Exam reveals no gallop and no friction rub.  No murmur heard.  Pulmonary/Chest: Effort normal and breath sounds normal. No respiratory distress. He has no wheezes.  Abdominal: Soft. Bowel sounds are normal. He exhibits no distension. There is no tenderness.  Lymphadenopathy:  He has no cervical adenopathy.  Neurological: He is alert and oriented to person, place, and time.  Skin: Skin is warm and dry. No rash noted. No erythema.  Psychiatric: He has a normal mood and affect. His behavior is normal.     Lab Results Recent Labs    08/25/19 0536 08/26/19 0538  WBC 13.2* 10.8*  HGB 11.8* 10.7*  HCT 38.7* 34.2*  NA 137  --   K 3.8  --   CL 101  --   CO2 27  --   BUN 7  --   CREATININE 0.85  --    Liver Panel Recent Labs    08/25/19 0536  PROT 7.7  ALBUMIN 3.3*  AST 20  ALT 25  ALKPHOS 92  BILITOT 0.9   Microbiology: 1/14 blood cx ngtd 1/10 blood cx +mssa Studies/Results: NM Pulmonary Perfusion  Result Date: 08/26/2019 CLINICAL DATA:  Shortness of breath, vomiting EXAM: NUCLEAR MEDICINE PERFUSION LUNG SCAN TECHNIQUE: Perfusion images were obtained in multiple projections after intravenous injection of radiopharmaceutical. Ventilation scans intentionally deferred if perfusion scan and chest x-ray adequate for interpretation  during [place] 19 epidemic. RADIOPHARMACEUTICALS:  1.5 mCi Tc-78m MAA IV COMPARISON:  Chest x-ray 08/25/2019 FINDINGS: There is a perfusion defect noted anteriorly in the right upper lobe corresponding to the area of airspace disease on prior chest x-ray. No ventilation was performed. IMPRESSION: Wedge-shaped perfusion defect in the anterior right upper lobe corresponding to airspace disease seen on chest x-ray. Without ventilation, this is indeterminate for pulmonary embolus. Recommend CTA chest once adequate IV access is obtained. Electronically Signed   By: Rolm Baptise M.D.   On: 08/26/2019 19:49   ECHOCARDIOGRAM LIMITED  Result Date:  08/27/2019   ECHOCARDIOGRAM LIMITED REPORT   Patient Name:   Justin Chaney Date of Exam: 08/27/2019 Medical Rec #:  [id number]        Height:       62.0 in Accession #:    [id number]       Weight:       228.0 lb Date of Birth:  [birth year]        BSA:          2.02 m Patient Age:    23 years         BP:           110/67 mmHg Patient Gender: M                HR:           109 bpm. Exam Location:  Inpatient  Procedure: Limited Echo, Cardiac Doppler and Color Doppler Indications:    I26.02 Pulmonary embolus (suspected)  History:        Patient has prior history of Echocardiogram examinations. IVDU.                 Suspected pulmonary embolism.  Sonographer:    Roseanna Rainbow RDCS Referring Phys: [id number] [place]  Sonographer Comments: Limited echo for right heart strain due to suspected pulmonary embolus. Unable to obtain CTA. IMPRESSIONS  1. Left ventricular ejection fraction, by visual estimation, is 60 to 65%. Left ventricular septal wall thickness was moderately increased. Mildly increased left ventricular posterior wall thickness. There is mildly increased left ventricular wall thickness.  2. The left ventricle has no regional wall motion abnormalities.  3. Global right ventricle has mildly reduced systolic function.The right ventricular size is moderately enlarged.  4. Hypokinetic right ventricular apex.  5. Trivial mitral valve regurgitation.  6. Tricuspid valve regurgitation is mild.  7. Tricuspid valve regurgitation is mild.  8. Normal pulmonary artery systolic pressure.  9. The inferior vena cava is normal in size with greater than 50% respiratory variability, suggesting right atrial pressure of 3 mmHg. FINDINGS  Left Ventricle: Left ventricular ejection fraction, by visual estimation, is 60 to 65%. The left ventricle has no regional wall motion abnormalities. Left ventricular septal wall thickness was moderately increased. Mildly increased left ventricular posterior wall thickness. There is mildly increased  left ventricular wall thickness. Left ventricular diastolic parameters were normal. Normal left atrial pressure. Right Ventricle: The right ventricular size is moderately enlarged. Global RV systolic function is has mildly reduced systolic function. The tricuspid regurgitant velocity is 2.03 m/s, and with an assumed right atrial pressure of 3 mmHg, the estimated right ventricular systolic pressure is normal at 19.5 mmHg. The motion of the right ventricle apex hypokinetic. Mitral Valve: Trivial mitral valve regurgitation. Tricuspid Valve: Tricuspid valve regurgitation is mild. Venous: The inferior vena cava is normal in size with greater than 50% respiratory variability, suggesting right atrial pressure of 3  mmHg.  LEFT VENTRICLE          Normals PLAX 2D LVIDd:         3.80 cm  3.6 cm LVIDs:         2.40 cm  1.7 cm LV PW:         1.20 cm  1.4 cm LV IVS:        1.50 cm  1.3 cm LVOT diam:     2.00 cm  2.0 cm LV SV:         42 ml    79 ml LV SV Index:   19.13    45 ml/m2 LVOT Area:     3.14 cm 3.14 cm2  LV Volumes (MOD)             Normals LV area d, A2C:    22.80 cm LV area d, A4C:    34.70 cm LV area s, A2C:    12.60 cm LV area s, A4C:    19.40 cm LV major d, A2C:   7.28 cm LV major d, A4C:   8.05 cm LV major s, A2C:   5.61 cm LV major s, A4C:   6.50 cm LV vol d, MOD A2C: 59.8 ml   68 ml LV vol d, MOD A4C: 120.0 ml LV vol s, MOD A2C: 25.0 ml   24 ml LV vol s, MOD A4C: 48.3 ml LV SV MOD A2C:     34.8 ml LV SV MOD A4C:     120.0 ml LV SV MOD BP:      51.7 ml   45 ml RIGHT VENTRICLE             IVC RV S prime:     11.70 cm/s  IVC diam: 1.60 cm TAPSE (M-mode): 1.8 cm LEFT ATRIUM         Index LA diam:    3.40 cm 1.68 cm/m   AORTA                 Normals Ao Root diam: 3.40 cm 31 mm TRICUSPID VALVE             Normals TR Peak grad:   16.5 mmHg TR Vmax:        203.00 cm/s 288 cm/s  SHUNTS Systemic Diam: 2.00 cm  Skeet Latch MD Electronically signed by Skeet Latch MD Signature Date/Time: 08/27/2019/11:29:40 AM     Final    [name] EKG SITE RITE  Result Date: 08/26/2019 If Site Rite image not attached, placement could not be confirmed due to current cardiac rhythm.   Assessment/Plan: MSSA TV endocarditis with pulmonary septic emboli/pneumonia/abscess = continue on cefazolin. We will plan to treat for 6 wk total, patient initially wanted to leave on the 28th. He is now willing to stay til feb 8th. We will determine how to finish out the 6 wk course at that time.  [facility] Cell: [phone] Pager: [phone]  08/27/2019, 4:01 PM

## 2019-08-27 NOTE — Progress Notes (Signed)
Discussed echo findings with cardiology Dr. Oval Linsey.  Could not rule out pulmonary embolism.    Started [place], Eliquis, for 3 months.  Appreciate Dr. Oval Linsey taking the time to go over and reviewing all his TTEs and TEE.

## 2019-08-27 NOTE — Progress Notes (Signed)
   Vital Signs MEWS/VS Documentation      08/27/2019 0501 08/27/2019 0521 08/27/2019 0700 08/27/2019 0939   MEWS Score:  0  0  0  1   MEWS Score Color:  Green  Green  Green  Green   Resp:  18  18  --  16   Pulse:  87  88  --  (!) 104   BP:  118/76  111/65  --  110/67   Temp:  98.6 F (37 C)  98.2 F (36.8 C)  --  98.7 F (37.1 C)   O2 Device:  Room Air  Room Air  --  Room Air   Level of Consciousness:  Alert  --  --  Alert     Patient is no longer in the mews protocol per recent vital signs and status.    Justin Chaney 08/27/2019,10:04 AM

## 2019-08-27 NOTE — Progress Notes (Addendum)
NP was called by RN because pt vomited "frank red blood with clots". Bp 80s. T 103. RRRN called. NP to bedside.  S: + nausea today. No previous vomiting. Hasn't vomited blood since he was a child. Denies excessive use of NSAIDs or goody powders at home. Had BM today without bleeding. No abdominal pain. Denies ETOH use.  Per RN, pt vomited about 10cc of blood in approximately 100-200cc of vomitus-undigested food.  O: Well appearing male in NAD. T 103.20f. BP 102/50. HR 105. RR 18. O2 sat 96% on RA. He is alert and oriented x 4. Card: slightly tachycardic. S1S2. Resp: normal effort. Abd: round, + BS x 4 quadrants. NT, ND. Skin is warm to touch.  A/P: 1. Hematemesis x 1. Pt was started on Eliquis for a ? PE on VQ scan today. Has had one dose of 10mg  at 1400 hrs today. Had Ibuprofen for fever. Review of chart shows no hx liver disease and he denies ETOH use. Will give the pt Protonix IV and not start a drip unless this recurs. If recurs, get abd film and possibly place NGT. Hold Eliquis for now. D/c Ibuprofen. NPO. Stat CBC.  2. Hypotension-back up to normal. Giving a 1L NS bolus. 3. 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 ID is following and he is on appropriate treatment. Bolus should help. Continue supportive care.  4. ? PE-VQ was not definitely positive for PE. Pt refused new IV placement to obtain a CTA chest. Hold Eliquis tonight and follow. He had 10mg  today already. Perhaps, if this is a one time event, can restart in am.  KJKG, NP [facility] Update: Pt MEWS continues to fire red due to fever with resultant tachycardia. No further vomiting. He is refusing rectal tylenol.  Update: MEWS back to yellow. Temp down. CBC without leukocytosis. LA 0.7.  KJKG, NP [facility]

## 2019-08-27 NOTE — TOC Progression Note (Signed)
Transition of Care [facility]) - Progression Note    Patient Details  Name: Dent Kelner MRN: [id number] Date of Birth: 1995/09/13  [facility] CM/SW Contact  Ross Ludwig, [place] Phone Number: 08/27/2019, 5:29 PM  Clinical Narrative:     CSW received consult that patient will need Eliquis, patient has Medicaid.  Medicaid will pay for Eliquis, no other medication needs anticipated.   Expected Discharge Plan: Skilled Nursing Facility Barriers to Discharge: SNF Pending bed offer  Expected Discharge Plan and Services Expected Discharge Plan: [place]   Discharge Planning Services: CM Consult   Living arrangements for the past 2 months: No permanent address                                       Social Determinants of Health (SDOH) Interventions    Readmission Risk Interventions No flowsheet data found.

## 2019-08-27 NOTE — Progress Notes (Signed)
Nutrition Brief Note  Patient identified on the Malnutrition Screening Tool (MST) Report  Pt now LOS x 16. Pt did not eat anything yesterday but prior to that pt was consuming 50-100% of meals for a majority of admission. Do not feel supplements are needed at this time unless pt refuses to eat for extended period of time.  Per chart review, pt refusing most patient care at this time.   No recent weight loss noted in weight records PTA. Pt hasn't been weighed since 1/10.  Wt Readings from Last 15 Encounters:  08/12/19 103.4 kg  01/03/18 99.8 kg  12/11/17 106.6 kg  06/25/17 107 kg  06/22/17 107 kg  01/12/17 110.7 kg  11/16/15 103.4 kg  11/05/15 103 kg  10/10/15 108.9 kg    Body mass index is 41.69 kg/m. Patient meets criteria for obesity based on current BMI.   Current diet order is regular. Labs and medications reviewed.   No nutrition interventions warranted at this time. If nutrition issues arise, please consult RD.   Clayton Bibles, MS, RD, LDN Inpatient Clinical Dietitian Pager: [phone] After Hours Pager: [phone]

## 2019-08-27 NOTE — Progress Notes (Signed)
Patient requested gown change and linen change after CHG wipe bath with minimal assist from RN. Upon his return to the bed, patient refused bed alarm to be reset. Discussed in depth with patient about safety precautions based on his fall risk score. Patient continues to decline bed alarm reset on the bed. Patient advised to notify staff prior to getting out of bed independently, patient has agreed to this and reports understanding. Patient resting in the bed with bed in lowest position, and call bell within reach. RN will continue to monitor and perform frequent rounding.

## 2019-08-27 NOTE — Discharge Instructions (Addendum)
Information on my medicine - ELIQUIS (apixaban)  This medication education was reviewed with me or my healthcare representative as part of my discharge preparation.    Why was Eliquis prescribed for you? Eliquis was prescribed to treat blood clots that may have been found in the veins of your legs (deep vein thrombosis) or in your lungs (pulmonary embolism) and to reduce the risk of them occurring again.  What do You need to know about Eliquis ? The starting dose is 10 mg (two 5 mg tablets) taken TWICE daily for the FIRST SEVEN (7) DAYS, then on 09/05/2019 the dose is reduced to ONE 5 mg tablet taken TWICE daily.  Eliquis may be taken with or without food.   Try to take the dose about the same time in the morning and in the evening. If you have difficulty swallowing the tablet whole please discuss with your pharmacist how to take the medication safely.  Take Eliquis exactly as prescribed and DO NOT stop taking Eliquis without talking to the doctor who prescribed the medication.  Stopping may increase your risk of developing a new blood clot.  Refill your prescription before you run out.  After discharge, you should have regular check-up appointments with your healthcare provider that is prescribing your Eliquis.    What do you do if you miss a dose? If a dose of ELIQUIS is not taken at the scheduled time, take it as soon as possible on the same day and twice-daily administration should be resumed. The dose should not be doubled to make up for a missed dose.  Important Safety Information A possible side effect of Eliquis is bleeding. You should call your healthcare provider right away if you experience any of the following: ? Bleeding from an injury or your nose that does not stop. ? Unusual colored urine (red or dark brown) or unusual colored stools (red or black). ? Unusual bruising for unknown reasons. ? A serious fall or if you hit your head (even if there is no bleeding).  Some  medicines may interact with Eliquis and might increase your risk of bleeding or clotting while on Eliquis. To help avoid this, consult your healthcare provider or pharmacist prior to using any new prescription or non-prescription medications, including herbals, vitamins, non-steroidal anti-inflammatory drugs (NSAIDs) and supplements.  This website has more information on Eliquis (apixaban): http://www.eliquis.com/eliquis/home

## 2019-08-27 NOTE — Progress Notes (Addendum)
PROGRESS NOTE  Justin Chaney [id number] DOB: [birth year] DOA: 08/10/2019 PCP: Patient, No Pcp Per  HPI/Recap of past 46 hours: 24 year old gentleman with history of bipolar disorder, VP shunt, schizophrenia and IV drug use admitted to hospital with fever chills myalgia, frontal headache and shortness of breath of 1 week.  In the emergency room he was febrile with temperature 103, tachycardic 130, blood pressure low normal responded to fluid.  Lactic acidosis and platelets of 42.  Patient was empirically treated with antibiotics.  Blood cultures with MSSA and echocardiogram with endocarditis.  He is on IV antibiotics for MSSA bacteremia and right-sided endocarditis.  Ancef from 08/16/2019 through 08/30/2019 then ID will consider long-acting infusion.  In-house due to history of IV drug abuse.  Had a fall 1/22.  He reports he felt dizzy, felt like he was about to pass out prior to his fall.  Denies hitting his head.  Denies any pain this morning.  Orthostatic vital signs and twelve-lead EKG ordered.  Patient had refused EKG.  Febrile with Tmax 102.4.  And leukocytosis with WBC 13 K.  Blood cultures obtained peripherally.  Discussed with infectious disease Dr. Linus Salmons.  This could happen in the setting of tricuspid endocarditis.  We will continue to follow cultures.  Procalcitonin and lactic acid negative.  08/26/19:  Reports pleuritic pain worse when he attempts to take a deep breath. Tachycardic. Mild drop in O2 saturation down to 96% RA from 100% RA. CTA chest ordered to rule out PE. Unfortunately patient has poor peripheral IV access.  He declines PICC line or other attempts at establishing IV access.   08/27/19: Seen and examined.  Febrile overnight with T-max of 102.8.  Also persistently tachycardic with drop in O2 saturation down to 95%.  Continues to decline PICC line placement or any attempt to obtain IV peripheral access for contrast.  Reconsulted ID, discussed with Dr. Graylon Good.  Curb sided with  PCCM for second opinion on abnormal VQ scan.   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 Repeat blood cultures, 08/16/2019, negative Final. Initially treated with broad-spectrum, on Ancef.              ID recommended Ancef from 08/16/2019 through 08/30/2019 then ID will consider long-acting infusion or oral linezolid. Completing IV antibiotics in-house due to history of IV drug abuse.  Recurrent fever and leukocytosis in the setting of MSSA bacteremia with tricuspid endocarditis Blood cultures drawn on 08/25/19 peripherally in process. Continue to follow cultures. Procalcitonin and lactic acid negative on 2019-08-25. Continue to monitor WBC and fever curve.  Discussed with infectious disease Dr. Linus Salmons 1/23.  Not unlikely to see recurrent fever and leukocytosis in the setting of R sided endocarditis. Re-consulted ID, discussed with Dr. Graylon Good on 08/27/19.  Pleuritic chest pain Reports pleuritic pain worse when he attempts to take a deep breath. Tachycardic. Mild drop in O2 saturation down to 96% RA from 100% RA at baseline. Rule out PE CTA chest ordered on 2019-08-26  to rule out PE, pending Unfortunately patient has poor peripheral IV access Febrile overnight with T-max of 102.8.  Also persistently tachycardic with drop in O2 saturation down to 95%.  Continues to decline PICC line placement or any attempt to obtain IV peripheral  access for contrast.   Curb side requested on 08/27/19 with PCCM for second opinion on abnormal VQ scan. VQ scan done on 08/26/19 shows wedge shaped perfusion defect in the anterior R upper lobe corresponding to airspace disease seen on CXR.  Indeterminate for PE without ventilation.               Right suprahilar region opacity on chest x-ray Independently reviewed chest x-ray done on 08/25/2019 which showed right suprahilar hilar opacity. Patient is on Ancef for MSSA bacteremia.  Procalcitonin and lactic acid negative. Cultures have been drawn, continue to follow.  Obtain sputum culture if able, may need to be induced. WBC trending down.  Continue Ancef per ID.  Medical noncompliance Patient has intermittently refused chemical DVT prophylaxis Lovenox subcu daily Also declined EKG, IV access placement, and others.  Transient anemia thrombocytopenia: Likely 2/2 to acute illness  History of drug use: Patient has been using heroin recently.  No evidence of withdrawal.  Does not want any treatment.  Patient states that he can take care of himself.  Schizophrenia: Not taking medicine for a while.  Seen by psychiatry.  Started on Seroquel and Haldol at night.  Fall in the hospital Possibly orthosthatic hypotension Orthosthatic vital signs pending Has declined 12 lead EKG Continue to monitor w fall precautions PT to assess. Patient declines stating he does not need PT.  Chronic constipation Continue bowel regimen.   DVT prophylaxis: Lovenox subcu daily. Code Status: Full code Family Communication: None Disposition Plan: Remains in the hospital.  Ongoing work up to r/o PE.  Not an outpatient infusion candidate due to active drug abuse.   Consultants:   Infectious disease  Psychiatry  Procedures:   None  Antimicrobials:                Ancef  Objective: Vitals:   08/27/19 0019 08/27/19 0501 08/27/19 0521 08/27/19 0939  BP: 98/60 118/76 111/65 110/67  Pulse: (!) 108  87 88 (!) 104  Resp: 18 18 18 16   Temp: 98.3 F (36.8 C) 98.6 F (37 C) 98.2 F (36.8 C) 98.7 F (37.1 C)  TempSrc: Oral Oral Oral   SpO2: 96% 100% 99% 95%  Weight:      Height:        Intake/Output Summary (Last 24 hours) at 08/27/2019 1020 Last data filed at 08/27/2019 0630 Gross per 24 hour  Intake 1394.16 ml  Output 1375 ml  Net 19.16 ml   Filed Weights   08/12/19 0000  Weight: 103.4 kg    Exam:  No changes from prior . General: 24 y.o. year-old male well-developed well-nourished no acute distress.  Alert and oriented x3.   . Cardiovascular: Tachycardic with no rubs or gallops. Marland Kitchen Respiratory: Clear to auscultation no wheezes no rales.   . Abdomen: Normal bowel sounds present.  Obese.  Nontender. . Musculoskeletal: No lower extremity edema bilaterally. Psychiatry: Mood is appropriate for condition and setting.   Data Reviewed: CBC: Recent Labs  Lab 08/23/19 0555 08/25/19 0536 08/26/19 0538  WBC 11.8* 13.2* 10.8*  NEUTROABS  --  9.8* 7.9*  HGB 12.1* 11.8* 10.7*  HCT 39.5 38.7* 34.2*  MCV 89.0 88.8 87.2  PLT 434* 371 318   Basic  Metabolic Panel: Recent Labs  Lab 08/23/19 0555 08/25/19 0536  NA 138 137  K 4.2 3.8  CL 100 101  CO2 27 27  GLUCOSE 109* 120*  BUN 11 7  CREATININE 0.77 0.85  CALCIUM 9.5 9.2  MG  --  1.9   GFR: Estimated Creatinine Clearance: 139.2 mL/min (by C-G formula based on SCr of 0.85 mg/dL). Liver Function Tests: Recent Labs  Lab 08/25/19 0536  AST 20  ALT 25  ALKPHOS 92  BILITOT 0.9  PROT 7.7  ALBUMIN 3.3*   Recent Labs  Lab 08/25/19 0536  LIPASE 23   No results for input(s): AMMONIA in the last 168 hours. Coagulation Profile: No results for input(s): INR, PROTIME in the last 168 hours. Cardiac Enzymes: No results for input(s): CKTOTAL, CKMB, CKMBINDEX, TROPONINI in the last 168 hours. BNP (last 3 results) No results for input(s): PROBNP in the last 8760 hours. HbA1C: No results for input(s): HGBA1C in the last  72 hours. CBG: No results for input(s): GLUCAP in the last 168 hours. Lipid Profile: No results for input(s): CHOL, HDL, LDLCALC, TRIG, CHOLHDL, LDLDIRECT in the last 72 hours. Thyroid Function Tests: No results for input(s): TSH, T4TOTAL, FREET4, T3FREE, THYROIDAB in the last 72 hours. Anemia Panel: No results for input(s): VITAMINB12, FOLATE, FERRITIN, TIBC, IRON, RETICCTPCT in the last 72 hours. Urine analysis:    Component Value Date/Time   COLORURINE YELLOW 08/10/2019 [place] 08/10/2019 1615   LABSPEC 1.018 08/10/2019 1615   PHURINE 7.0 08/10/2019 1615   GLUCOSEU NEGATIVE 08/10/2019 1615   HGBUR SMALL (A) 08/10/2019 1615   BILIRUBINUR NEGATIVE 08/10/2019 1615   KETONESUR NEGATIVE 08/10/2019 1615   PROTEINUR NEGATIVE 08/10/2019 1615   UROBILINOGEN 2.0 (H) 04/01/2014 2200   NITRITE NEGATIVE 08/10/2019 1615   LEUKOCYTESUR NEGATIVE 08/10/2019 1615   Sepsis Labs: @LABRCNTIP (procalcitonin:4,lacticidven:4)  ) Recent Results (from the past 240 hour(s))  Culture, blood (routine x 2)     Status: None (Preliminary result)   Collection Time: 08/25/19  5:36 AM   Specimen: BLOOD  Result Value Ref Range Status   Specimen Description   Final    BLOOD LEFT ANTECUBITAL Performed at [facility], [address] [place], [place] [address]    Special Requests   Final    BOTTLES DRAWN AEROBIC AND ANAEROBIC Blood Culture adequate volume Performed at [facility], [address] [place], [place] [address]    Culture   Final    NO GROWTH 2 DAYS Performed at [facility], [employer] [address] [place], [place] [address]    Report Status PENDING  Incomplete  Culture, blood (routine x 2)     Status: None (Preliminary result)   Collection Time: 08/25/19  5:46 AM   Specimen: BLOOD  Result Value Ref Range Status   Specimen Description   Final    BLOOD LEFT ARM Performed at [facility], [address] [place], [place] [address]     Special Requests   Final    BOTTLES DRAWN AEROBIC AND ANAEROBIC Blood Culture results may not be optimal due to an excessive volume of blood received in culture bottles Performed at [facility], [address] [place], [place] [address]    Culture   Final    NO GROWTH 2 DAYS Performed at [facility], [place] [address], [place], [place] [address]    Report Status PENDING  Incomplete  Studies: NM Pulmonary Perfusion  Result Date: 08/26/2019 CLINICAL DATA:  Shortness of breath, vomiting EXAM: NUCLEAR MEDICINE PERFUSION LUNG SCAN TECHNIQUE: Perfusion images were obtained in multiple projections after intravenous injection of radiopharmaceutical. Ventilation scans intentionally deferred if perfusion scan and chest x-ray adequate for interpretation during COVID 19 epidemic. RADIOPHARMACEUTICALS:  1.5 mCi Tc-78m MAA IV COMPARISON:  Chest x-ray 08/25/2019 FINDINGS: There is a perfusion defect noted anteriorly in the right upper lobe corresponding to the area of airspace disease on prior chest x-ray. No ventilation was performed. IMPRESSION: Wedge-shaped perfusion defect in the anterior right upper lobe corresponding to airspace disease seen on chest x-ray. Without ventilation, this is indeterminate for pulmonary embolus. Recommend CTA chest once adequate IV access is obtained. Electronically Signed   By: Rolm Baptise M.D.   On: 08/26/2019 19:49   [name] EKG SITE RITE  Result Date: 08/26/2019 If Site Rite image not attached, placement could not be confirmed due to current cardiac rhythm.   Scheduled Meds: . enoxaparin (LOVENOX) injection  40 mg Subcutaneous Q24H  . feeding supplement (ENSURE ENLIVE)  237 mL Oral BID BM  . haloperidol  2 mg Oral QHS  . mouth rinse  15 mL Mouth Rinse BID  . polyethylene glycol  17 g Oral Daily  . potassium & sodium phosphates  1 packet Oral TID WC & HS  . QUEtiapine  300 mg Oral QHS  . senna-docusate  2 tablet Oral BID    Continuous  Infusions: . sodium chloride Stopped (08/17/19 2149)  . sodium chloride 100 mL/hr at 08/27/19 0128  .  ceFAZolin (ANCEF) IV 2 g (08/27/19 0935)     LOS: 16 days     Kayleen Memos, MD [facility] Pager [phone]  If 7PM-7AM, please contact night-coverage [email or web address] Password [email or web address] 08/27/2019, 10:20 AM

## 2019-08-27 NOTE — Progress Notes (Signed)
   08/27/19 2124  Vitals  Temp (!) 103.1 F (39.5 C)  Temp Source Oral  BP (!) 88/52  BP Location Left Leg  BP Method Automatic  Patient Position (if appropriate) Lying  Pulse Rate Source Dinamap  ECG Heart Rate (!) 120  Resp 18  Level of Consciousness  Level of Consciousness Alert  Oxygen Therapy  SpO2 96 %  O2 Device Room Air  MEWS Score  MEWS Temp 2  MEWS Systolic 1  MEWS Pulse 2  MEWS RR 0  MEWS LOC 0  MEWS Score 5  MEWS Score Color Red  mews red initiated

## 2019-08-27 NOTE — Progress Notes (Signed)
Frank(approx 10cc) Emesis(undigesteed food)at 0930  Mews red On call/rapid called

## 2019-08-27 NOTE — Progress Notes (Signed)
Spoke to primary RN regarding PICC order, RN will follow with MD and will d/c PICC if not needed. Will follow up.

## 2019-08-28 ENCOUNTER — Encounter (HOSPITAL_COMMUNITY): Payer: Self-pay | Admitting: Emergency Medicine

## 2019-08-28 LAB — BASIC METABOLIC PANEL
Anion gap: 11 (ref 5–15)
BUN: 6 mg/dL (ref 6–20)
CO2: 23 mmol/L (ref 22–32)
Calcium: 8.6 mg/dL — ABNORMAL LOW (ref 8.9–10.3)
Chloride: 103 mmol/L (ref 98–111)
Creatinine, Ser: 0.6 mg/dL — ABNORMAL LOW (ref 0.61–1.24)
GFR calc Af Amer: >60 mL/min (ref >60)
GFR calc non Af Amer: >60 mL/min (ref >60)
Glucose, Bld: 90 mg/dL (ref 70–99)
Potassium: 4 mmol/L (ref 3.5–5.1)
Sodium: 137 mmol/L (ref 135–145)

## 2019-08-28 LAB — IRON AND TIBC
Iron: 23 ug/dL — ABNORMAL LOW (ref 45–182)
Saturation Ratios: 11 % — ABNORMAL LOW (ref 17.9–39.5)
TIBC: 208 ug/dL — ABNORMAL LOW (ref 250–450)
UIBC: 185 ug/dL

## 2019-08-28 LAB — CBC
HCT: 32.8 % — ABNORMAL LOW (ref 39.0–52.0)
HCT: 35.2 % — ABNORMAL LOW (ref 39.0–52.0)
Hemoglobin: 10.3 g/dL — ABNORMAL LOW (ref 13.0–17.0)
Hemoglobin: 11 g/dL — ABNORMAL LOW (ref 13.0–17.0)
MCH: 26.7 pg (ref 26.0–34.0)
MCH: 27.2 pg (ref 26.0–34.0)
MCHC: 31.4 g/dL (ref 30.0–36.0)
MCHC: 31.3 g/dL (ref 30.0–36.0)
MCV: 85 fL (ref 80.0–100.0)
MCV: 86.9 fL (ref 80.0–100.0)
Platelets: 330 10*3/uL (ref 150–400)
Platelets: 300 10*3/uL (ref 150–400)
RBC: 3.86 MIL/uL — ABNORMAL LOW (ref 4.22–5.81)
RBC: 4.05 MIL/uL — ABNORMAL LOW (ref 4.22–5.81)
RDW: 13.7 % (ref 11.5–15.5)
RDW: 13.8 % (ref 11.5–15.5)
WBC: 9.2 10*3/uL (ref 4.0–10.5)
WBC: 9.8 10*3/uL (ref 4.0–10.5)
nRBC: 0 % (ref 0.0–0.2)
nRBC: 0 % (ref 0.0–0.2)

## 2019-08-28 LAB — FERRITIN: Ferritin: 459 ng/mL — ABNORMAL HIGH (ref 24–336)

## 2019-08-28 LAB — VITAMIN B12: Vitamin B-12: 558 pg/mL (ref 180–914)

## 2019-08-28 LAB — LACTIC ACID, PLASMA: Lactic Acid, Venous: 0.7 mmol/L (ref 0.5–1.9)

## 2019-08-28 MED ORDER — APIXABAN 5 MG PO TABS: 10.0000 mg | ORAL_TABLET | Freq: Two times a day (BID) | ORAL | Status: DC

## 2019-08-28 MED ORDER — SODIUM CHLORIDE 0.9 % IV BOLUS
500.0000 mL | Freq: Once | INTRAVENOUS | Status: AC
  Administered 2019-08-28: 03:00:00 500 mL via INTRAVENOUS

## 2019-08-28 MED ORDER — HYDROCOD POLST-CPM POLST ER 10-8 MG/5ML PO SUER
5.0000 mL | Freq: Two times a day (BID) | ORAL | Status: AC
  Administered 2019-08-28 – 2019-08-30 (×6): 5 mL via ORAL
  Filled 2019-08-28 (×6): qty 5

## 2019-08-28 MED ORDER — SODIUM CHLORIDE 0.9 % IV BOLUS
500.0000 mL | Freq: Once | INTRAVENOUS | Status: AC
  Administered 2019-08-28: 500 mL via INTRAVENOUS

## 2019-08-28 MED ORDER — APIXABAN 5 MG PO TABS
10.0000 mg | ORAL_TABLET | Freq: Two times a day (BID) | ORAL | Status: DC
  Administered 2019-08-28 – 2019-08-29 (×2): 10 mg via ORAL
  Filled 2019-08-28: qty 4
  Filled 2019-08-28: qty 2

## 2019-08-28 MED ORDER — APIXABAN 5 MG PO TABS: 5.0000 mg | ORAL_TABLET | Freq: Two times a day (BID) | ORAL | Status: DC

## 2019-08-28 MED ORDER — ACETAMINOPHEN 10 MG/ML IV SOLN
1000.0000 mg | Freq: Four times a day (QID) | INTRAVENOUS | Status: AC | PRN
  Administered 2019-08-28: 01:00:00 1000 mg via INTRAVENOUS
  Filled 2019-08-28: qty 100

## 2019-08-28 MED ORDER — POLYETHYLENE GLYCOL 3350 17 G PO PACK
17.0000 g | PACK | Freq: Two times a day (BID) | ORAL | Status: DC
  Administered 2019-08-28 – 2019-09-01 (×8): 17 g via ORAL
  Filled 2019-08-28 (×18): qty 1

## 2019-08-28 MED ORDER — MORPHINE SULFATE (PF) 2 MG/ML IV SOLN
2.0000 mg | INTRAVENOUS | Status: AC | PRN
  Administered 2019-08-28 – 2019-08-29 (×6): 2 mg via INTRAVENOUS
  Filled 2019-08-28 (×6): qty 1

## 2019-08-28 MED ORDER — SODIUM CHLORIDE 0.9 % IV SOLN: INTRAVENOUS | Status: AC

## 2019-08-28 MED ORDER — BISACODYL 10 MG RE SUPP: 10.0000 mg | Freq: Every day | RECTAL | Status: DC | PRN

## 2019-08-28 NOTE — Consult Note (Signed)
Reason for Consult: Seemingly self-limited hematemesis versus hemoptysis Referring Physician: Hospital team  Justin Chaney is an 24 y.o. male.  HPI: Patient's hospital computer chart was reviewed and patient seen and examined and he says he spit up a little bit of blood maybe 10 cc last night and had a little bit mixed in his food this morning and has not had a bowel movement in 2 days and may have thrown up a little blood years ago but otherwise has no specific GI complaints and was on a blood thinner for short time for possible pulmonary embolus and did have a TEE a week or 2 ago and has been coughing some but has no other complaints  Past Medical History:  Diagnosis Date  . Asthma   . Bipolar disorder ([place])   . 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 Depression   . Homelessness   . Schizophrenia ([place])   . Seizures ([place])    childhood seizures  . Suicidal behavior   . Suicidal intent     Past Surgical History:  Procedure Laterality Date  . APPENDECTOMY    . SHUNT REMOVAL    . TEE WITHOUT CARDIOVERSION N/A 08/14/2019   Procedure: TRANSESOPHAGEAL ECHOCARDIOGRAM (TEE);  Surgeon: Pixie Casino, MD;  Location: [facility] ENDOSCOPY;  Service: Cardiovascular;  Laterality: N/A;  . TONSILLECTOMY    . VENTRICULO-PERITONEAL SHUNT PLACEMENT / LAPAROSCOPIC INSERTION PERITONEAL CATHETER      Family History  Problem Relation Age of Onset  . Hypertension Mother   . Mental illness Neg Hx     Social History:  reports that he has been smoking cigars. He has been smoking about 0.00 packs per day. He has never used smokeless tobacco. He reports that he does not drink alcohol or use drugs.  Allergies:  Allergies  Allergen Reactions  . Amoxicillin Hives and Other (See Comments)    CHILDHOOD ALLERGY - Per [facility] records, tolerates Ancef and Rocephin Has patient had a PCN reaction causing immediate rash, facial/tongue/throat swelling, SOB or lightheadedness with hypotension:  YES Has patient had a PCN reaction causing severe rash involving mucus membranes or skin necrosis: No Has patient had a PCN reaction that required hospitalization No    Medications: I have reviewed the patient's current medications.  Results for orders placed or performed during the hospital encounter of 08/10/19 (from the past 48 hour(s))  D-dimer, quantitative (not at [facility])     Status: Abnormal   Collection Time: 08/26/19  4:56 PM  Result Value Ref Range   D-Dimer, Quant 1.38 (H) 0.00 - 0.50 ug/mL-FEU    Comment: (NOTE) At the manufacturer cut-off of 0.50 ug/mL FEU, this assay has been documented to exclude PE with a sensitivity and negative predictive value of 97 to 99%.  At this time, this assay has not been approved by the FDA to exclude DVT/VTE. Results should be correlated with clinical presentation. Performed at [facility], [place] [address] [place], [place] [address]   Fibrinogen     Status: Abnormal   Collection Time: 08/26/19  4:56 PM  Result Value Ref Range   Fibrinogen 752 (H) 210 - 475 mg/dL    Comment: Performed at [facility], [place] [address] [place], [place] [address]  Troponin I (High Sensitivity)     Status: None   Collection Time: 08/26/19  4:56 PM  Result Value Ref Range   Troponin I (High Sensitivity) 3 <18 ng/L    Comment: (NOTE) Elevated  high sensitivity troponin I (hsTnI) values and significant  changes across serial measurements may suggest ACS but many other  chronic and acute conditions are known to elevate hsTnI results.  Refer to the "Links" section for chest pain algorithms and additional  guidance. Performed at [facility], [place] [address], [place], [place] [address]   CBC     Status: Abnormal   Collection Time: 08/28/19 12:25 AM  Result Value Ref Range   WBC 9.2 4.0 - 10.5 K/uL   RBC 3.86 (L) 4.22 - 5.81 MIL/uL   Hemoglobin 10.3 (L) 13.0 - 17.0 g/dL   HCT 32.8 (L) 39.0 - 52.0 %    MCV 85.0 80.0 - 100.0 fL   MCH 26.7 26.0 - 34.0 pg   MCHC 31.4 30.0 - 36.0 g/dL   RDW 13.7 11.5 - 15.5 %   Platelets 330 150 - 400 K/uL   nRBC 0.0 0.0 - 0.2 %    Comment: Performed at [facility], [place] [address], [place], [place] [address]  Lactic acid, plasma     Status: None   Collection Time: 08/28/19  3:02 AM  Result Value Ref Range   Lactic Acid, Venous 0.7 0.5 - 1.9 mmol/L    Comment: Performed at [facility], [place] [address] [place], [place] [address]  Iron and TIBC     Status: Abnormal   Collection Time: 08/28/19  7:11 AM  Result Value Ref Range   Iron 23 (L) 45 - 182 ug/dL   TIBC 208 (L) 250 - 450 ug/dL   Saturation Ratios 11 (L) 17.9 - 39.5 %   UIBC 185 ug/dL    Comment: Performed at [facility], [place] [address] [place], [place] [address]  Ferritin     Status: Abnormal   Collection Time: 08/28/19  7:11 AM  Result Value Ref Range   Ferritin 459 (H) 24 - 336 ng/mL    Comment: Performed at [facility], [place] [address] [place], [place] [address]  Vitamin B12     Status: None   Collection Time: 08/28/19  7:11 AM  Result Value Ref Range   Vitamin B-12 558 180 - 914 pg/mL    Comment: (NOTE) This assay is not validated for testing neonatal or myeloproliferative syndrome specimens for Vitamin B12 levels. Performed at [facility], [place] [address], [place], [place] [address]   CBC     Status: Abnormal   Collection Time: 08/28/19  7:12 AM  Result Value Ref Range   WBC 9.8 4.0 - 10.5 K/uL   RBC 4.05 (L) 4.22 - 5.81 MIL/uL   Hemoglobin 11.0 (L) 13.0 - 17.0 g/dL   HCT 35.2 (L) 39.0 - 52.0 %   MCV 86.9 80.0 - 100.0 fL   MCH 27.2 26.0 - 34.0 pg   MCHC 31.3 30.0 - 36.0 g/dL   RDW 13.8 11.5 - 15.5 %   Platelets 300 150 - 400 K/uL   nRBC 0.0 0.0 - 0.2 %    Comment: Performed at [facility], [place] [address], [place], [place] [id number]  Basic metabolic panel      Status: Abnormal   Collection Time: 08/28/19  7:12 AM  Result Value Ref Range   Sodium 137 135 - 145 mmol/L   Potassium 4.0 3.5 - 5.1 mmol/L   Chloride 103 98 - 111 mmol/L   CO2 23 22 - 32 mmol/L   Glucose, Bld 90 70 - 99  mg/dL   BUN 6 6 - 20 mg/dL   Creatinine, Ser 0.60 (L) 0.61 - 1.24 mg/dL   Calcium 8.6 (L) 8.9 - 10.3 mg/dL   GFR calc non Af Amer >60 >60 mL/min   GFR calc Af Amer >60 >60 mL/min   Anion gap 11 5 - 15    Comment: Performed at [facility], [place] [address], [place], [place] [address]    NM Pulmonary Perfusion  Result Date: 08/26/2019 CLINICAL DATA:  Shortness of breath, vomiting EXAM: NUCLEAR MEDICINE PERFUSION LUNG SCAN TECHNIQUE: Perfusion images were obtained in multiple projections after intravenous injection of radiopharmaceutical. Ventilation scans intentionally deferred if perfusion scan and chest x-ray adequate for interpretation during COVID 19 epidemic. RADIOPHARMACEUTICALS:  1.5 mCi Tc-6m MAA IV COMPARISON:  Chest x-ray 08/25/2019 FINDINGS: There is a perfusion defect noted anteriorly in the right upper lobe corresponding to the area of airspace disease on prior chest x-ray. No ventilation was performed. IMPRESSION: Wedge-shaped perfusion defect in the anterior right upper lobe corresponding to airspace disease seen on chest x-ray. Without ventilation, this is indeterminate for pulmonary embolus. Recommend CTA chest once adequate IV access is obtained. Electronically Signed   By: Rolm Baptise M.D.   On: 08/26/2019 19:49   ECHOCARDIOGRAM LIMITED  Result Date: 08/27/2019   ECHOCARDIOGRAM LIMITED REPORT   Patient Name:   Justin Chaney Date of Exam: 08/27/2019 Medical Rec #:  [id number]        Height:       62.0 in Accession #:    [id number]       Weight:       228.0 lb Date of Birth:  10-30-1995        BSA:          2.02 m Patient Age:    23 years         BP:           110/67 mmHg Patient Gender: M                HR:           109 bpm. Exam  Location:  Inpatient  Procedure: Limited Echo, Cardiac Doppler and Color Doppler Indications:    I26.02 Pulmonary embolus (suspected)  History:        Patient has prior history of Echocardiogram examinations. IVDU.                 Suspected pulmonary embolism.  Sonographer:    Roseanna Rainbow RDCS Referring Phys: [id number] [place]  Sonographer Comments: Limited echo for right heart strain due to suspected pulmonary embolus. Unable to obtain CTA. IMPRESSIONS  1. Left ventricular ejection fraction, by visual estimation, is 60 to 65%. Left ventricular septal wall thickness was moderately increased. Mildly increased left ventricular posterior wall thickness. There is mildly increased left ventricular wall thickness.  2. The left ventricle has no regional wall motion abnormalities.  3. Global right ventricle has mildly reduced systolic function.The right ventricular size is moderately enlarged.  4. Hypokinetic right ventricular apex.  5. Trivial mitral valve regurgitation.  6. Tricuspid valve regurgitation is mild.  7. Tricuspid valve regurgitation is mild.  8. Normal pulmonary artery systolic pressure.  9. The inferior vena cava is normal in size with greater than 50% respiratory variability, suggesting right atrial pressure of 3 mmHg. FINDINGS  Left Ventricle: Left ventricular ejection fraction, by visual estimation, is 60 to 65%. The left ventricle has no regional wall motion abnormalities. Left ventricular  septal wall thickness was moderately increased. Mildly increased left ventricular posterior wall thickness. There is mildly increased left ventricular wall thickness. Left ventricular diastolic parameters were normal. Normal left atrial pressure. Right Ventricle: The right ventricular size is moderately enlarged. Global RV systolic function is has mildly reduced systolic function. The tricuspid regurgitant velocity is 2.03 m/s, and with an assumed right atrial pressure of 3 mmHg, the estimated right ventricular  systolic pressure is normal at 19.5 mmHg. The motion of the right ventricle apex hypokinetic. Mitral Valve: Trivial mitral valve regurgitation. Tricuspid Valve: Tricuspid valve regurgitation is mild. Venous: The inferior vena cava is normal in size with greater than 50% respiratory variability, suggesting right atrial pressure of 3 mmHg.  LEFT VENTRICLE          Normals PLAX 2D LVIDd:         3.80 cm  3.6 cm LVIDs:         2.40 cm  1.7 cm LV PW:         1.20 cm  1.4 cm LV IVS:        1.50 cm  1.3 cm LVOT diam:     2.00 cm  2.0 cm LV SV:         42 ml    79 ml LV SV Index:   19.13    45 ml/m2 LVOT Area:     3.14 cm 3.14 cm2  LV Volumes (MOD)             Normals LV area d, A2C:    22.80 cm LV area d, A4C:    34.70 cm LV area s, A2C:    12.60 cm LV area s, A4C:    19.40 cm LV major d, A2C:   7.28 cm LV major d, A4C:   8.05 cm LV major s, A2C:   5.61 cm LV major s, A4C:   6.50 cm LV vol d, MOD A2C: 59.8 ml   68 ml LV vol d, MOD A4C: 120.0 ml LV vol s, MOD A2C: 25.0 ml   24 ml LV vol s, MOD A4C: 48.3 ml LV SV MOD A2C:     34.8 ml LV SV MOD A4C:     120.0 ml LV SV MOD BP:      51.7 ml   45 ml RIGHT VENTRICLE             IVC RV S prime:     11.70 cm/s  IVC diam: 1.60 cm TAPSE (M-mode): 1.8 cm LEFT ATRIUM         Index LA diam:    3.40 cm 1.68 cm/m   AORTA                 Normals Ao Root diam: 3.40 cm 31 mm TRICUSPID VALVE             Normals TR Peak grad:   16.5 mmHg TR Vmax:        203.00 cm/s 288 cm/s  SHUNTS Systemic Diam: 2.00 cm  Skeet Latch MD Electronically signed by Skeet Latch MD Signature Date/Time: 08/27/2019/11:29:40 AM    Final    [name] EKG SITE RITE  Result Date: 08/26/2019 If Site Rite image not attached, placement could not be confirmed due to current cardiac rhythm.   Review of Systems negative except above Blood pressure 121/65, pulse 95, temperature 98.2 F (36.8 C), temperature source Oral, resp. rate 18, height 5\' 2"  (1.575 m), weight 103.4 kg, SpO2 98 %.  Physical Exam exam  pertinent for his abdomen being soft nontender BUN and creatinine normal hemoglobin stable iron studies compatible with anemia of chronic disease  Assessment/Plan: Seemingly self-limited hemoptysis versus hematemesis and patient with multiple medical problems on blood thinners Plan: The patient does not currently want an endoscopy and I think it is okay to hold off and would just keep him on pump inhibitors and see how he does and will allow clear liquids today and can slowly advance diet and okay with me to resume blood thinners if needed but if signs of further bleeding happy to proceed with endoscopy and please call us if that is needed  [facility] E 08/28/2019, 3:51 PM

## 2019-08-28 NOTE — Progress Notes (Signed)
   Vital Signs MEWS/VS Documentation      08/28/2019 1251 08/28/2019 1354 08/28/2019 1354 08/28/2019 1448   MEWS Score:  1  1  1   0   MEWS Score Color:  Green  Green  Green  Green   Resp:  (!) 22  --  20  18   Pulse:  100  --  (!) 101  95   BP:  132/85  --  117/68  121/65   Temp:  99 F (37.2 C)  --  98.2 F (36.8 C)  98.2 F (36.8 C)   O2 Device:  Room Air  --  --  Room Air   Level of Consciousness:  --  Alert  --  --           Imogene Burn 08/28/2019,2:49 PM Pt sitting on edge of bed, states he feels better. VS staying stable. Pt requested pain med. Will cont. To monitor under red mews guidelines

## 2019-08-28 NOTE — Significant Event (Addendum)
Rapid Response Event Note  Overview: Time Called: 0010 Arrival Time: 0013 Event Type: MEWS  Of 6  Initial Focused Assessment: Called for RRT for MEWS of 6. Temp 102.8, Resp 32, B/P 114/53, MAP 68,  HR 121.Marland Kitchen Patient is alert and oriented x 4. He is refusing the rectal tylenol that was ordered. Patient requesting that he receive his medications by mouth. Education provided as to why patient is NPO, d/t the vomiting frank blood earlier this evening. Encouraged patient to remove his blankets, which he refused.    Interventions: Education provided regarding the importance to be  compliance with care.  Spoke with NP new orders received for bolus and IV tylenol.   Plan of Care (if not transferred): Continue to monitor MEWS score, IV tylenol, encourage patient to be compliant with care   Event Summary: Name of Physician Notified: NP Baltazar Najjar at [place] Jillienne Egner MSN, RN-BC  [place]

## 2019-08-28 NOTE — Progress Notes (Signed)
   Vital Signs MEWS/VS Documentation      08/28/2019 1105 08/28/2019 1215 08/28/2019 1216 08/28/2019 1251   MEWS Score:  2  0  0  1   MEWS Score Color:  Yellow  Green  Green  Green   Resp:  (!) 25  20  -  (!) 22   Pulse:  (!) 104  98  -  100   BP:  136/73  130/76  -  132/85   Temp:  99.8 F (37.7 C)  99.1 F (37.3 C)  -  99 F (37.2 C)   O2 Device:  -  -  -  Room Air   Level of Consciousness:  -  -  Alert  -           Imogene Burn 08/28/2019,12:56 PM Pt sleeping, no new complaints. VS have improved, will cont to monitor under red mews guidelines

## 2019-08-28 NOTE — Progress Notes (Signed)
ANTICOAGULATION CONSULT NOTE - Initial Consult  Pharmacy Consult for apixaban Indication: pulmonary embolus  Allergies  Allergen Reactions  . Amoxicillin Hives and Other (See Comments)    CHILDHOOD ALLERGY - Per [facility] records, tolerates Ancef and Rocephin Has patient had a PCN reaction causing immediate rash, facial/tongue/throat swelling, SOB or lightheadedness with hypotension: YES Has patient had a PCN reaction causing severe rash involving mucus membranes or skin necrosis: No Has patient had a PCN reaction that required hospitalization No    Patient Measurements: Height: 5\' 2"  (157.5 cm) Weight: 227 lb 15.3 oz (103.4 kg) IBW/kg (Calculated) : 54.6   Vital Signs: Temp: 98.4 F (36.9 C) (01/26 1757) Temp Source: Oral (01/26 1757) BP: 130/88 (01/26 1757) Pulse Rate: 106 (01/26 1757)  Labs: Recent Labs    08/26/19 0538 08/26/19 0538 08/26/19 1656 08/28/19 0025 08/28/19 0712  HGB 10.7*   < >  --  10.3* 11.0*  HCT 34.2*  --   --  32.8* 35.2*  PLT 318  --   --  330 300  CREATININE  --   --   --   --  0.60*  TROPONINIHS  --   --  3  --   --    < > = values in this interval not displayed.    Estimated Creatinine Clearance: 147.9 mL/min (A) (by C-G formula based on SCr of 0.6 mg/dL (L)).   Medical History: Past Medical History:  Diagnosis Date  . Asthma   . Bipolar disorder ([place])   . 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 Depression   . Homelessness   . Schizophrenia ([place])   . Seizures ([place])    childhood seizures  . Suicidal behavior   . Suicidal intent       Assessment: Patient is a 24 year old gentleman with history of bipolar disorder, VP shunt, schizophrenia and IV drug use admitted to the hospital with fever chills myalgia, frontal headache and shortness of breath of 1 week. 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 She was started on Eliquis on 1/25 for PE. It was subsequently d/ced  after one dose (10 mg) given on 1/25 d/t hematemesis. GI team suspects it's "self-limited hemoptysis vs hematemesis." Pharmacy was consulted to resume Eliquis back on 1/26.    Plan:  -eliquis 10mg  po twice daily for 7 days then decrease to 5mg  tablet twice daily thereafter - f/u sign and symptoms of bleeding  Dia Sitter, PharmD, BCPS 08/28/2019 7:15 PM

## 2019-08-28 NOTE — Progress Notes (Signed)
   Vital Signs MEWS/VS Documentation      08/28/2019 1105 08/28/2019 1215 08/28/2019 1216 08/28/2019 1251   MEWS Score:  2  0  0  1   MEWS Score Color:  Yellow  Green  Green  Green   Resp:  (!) 25  20  --  (!) 22   Pulse:  (!) 104  98  --  100   BP:  136/73  130/76  --  132/85   Temp:  99.8 F (37.7 C)  99.1 F (37.3 C)  --  99 F (37.2 C)   O2 Device:  --  --  --  Room Air   Level of Consciousness:  --  --  Alert  --           Justin Chaney 08/28/2019,12:58 PM Pt laying in bed, no complaints, physician in room, VS improving, will cont to monitor under red mews guidelines

## 2019-08-28 NOTE — Significant Event (Addendum)
Rapid Response Event Note  Overview: Time Called: 2125 Arrival Time: 2150 Event Type: MEWS   Initial Focused Assessment: Call received for RRT for patients MEWS score of 5, Temp of 103 HR 123,  Primary Nurse reported that patient vomited frank red blood with clots.Sherrin Daisy primary nurse to notify provider. Patient was also started on Eliquis for a PE.   Interventions: NP at bedside NS bolus Hold Eliquis  Ice packs NPO  CBC   Plan of care. Patient to remain in room, continue to monitor MEWS score.    Event Summary: Name of Physician Notified: NP Baltazar Najjar at 2150  new orders noted and received.     at    Outcome: Stayed in room and stabalized Continue to monitor MEWS   Complete time 08/27/19 @ 2230

## 2019-08-28 NOTE — Progress Notes (Signed)
   Vital Signs MEWS/VS Documentation      08/28/2019 0700 08/28/2019 0900 08/28/2019 0920 08/28/2019 0930   MEWS Score:  2  --  4  4   MEWS Score Color:  Yellow  --  Red  Red   Resp:  --  --  (!) 22  --   Pulse:  --  --  (!) 116  --   BP:  --  --  (!) 120/53  --   Temp:  --  --  (!) 101.2 F (38.4 C)  --   O2 Device:  --  --  Room Air  --   Level of Consciousness:  --  --  --  Alert           Imogene Burn 08/28/2019,9:46 AM Pt laying in bed, no new complaints. Medicine given, provider notified, no new orders at this time, will follow red mews guidelines

## 2019-08-28 NOTE — Progress Notes (Signed)
   Vital Signs MEWS/VS Documentation      08/28/2019 1031 08/28/2019 1047 08/28/2019 1105 08/28/2019 1105   MEWS Score:  5  4  4   (Pended)   2   MEWS Score Color:  Red  Red  Red  (Pended)   Yellow   Resp:  (!) 26  (!) 28  --  (!) 25   Pulse:  (!) 112  (!) 112  --  (!) 104   BP:  120/68  133/75  --  136/73   Temp:  (!) 101.1 F (38.4 C)  (!) 100.4 F (38 C)  --  99.8 F (37.7 C)   Level of Consciousness:  --  --  Alert  (Pended)   --           Imogene Burn 08/28/2019,11:37 AM Pt laying in bed, no c/o SOB. Provider ordered IS, will educate pt on IS, cont to monitor under red mews guidelines

## 2019-08-28 NOTE — Progress Notes (Signed)
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  In the emergency room he was febrile with temperature 103, tachycardic 130, blood pressure low normal responded to fluid.  Lactic acidosis and platelets of 42.  Patient was empirically treated with antibiotics.  Blood cultures with MSSA and echocardiogram with endocarditis.  He is on IV antibiotics for MSSA bacteremia and right-sided endocarditis.  Ancef from 08/16/2019 through 08/30/2019 per ID.  Then will consider long-acting infusion.  IV infusions done in-house due to history of IV drug abuse.  Had a fall 1/22.  He reported feeling dizzy.  Denies hitting his head.  Denies any muscular or joints pain.  In the past few days has been having recurrent fevers.  ID made aware and is following.  Repeated blood cx done on 08/25/19 negative to date.  Per ID, keep on Ancef.  Hospital course complicated by pleuritic chest pain.  Trop negative however could not r/o PE on VQ scan and TTE.  Started on Eliquis on 08/27/19.  Had an upper GI bleed, vomitted frank blood about 10cc on the night of 08/27/19.  [facility] consulted, Dr. Watt Climes.  We greatly appreciate GI and ID assistance.   To note, patient has been quite non compliant with medical management.  Refusing blood tests, imaging, and treatments.  08/28/19:  Seen and examined reports pleuritic pain, worse when he takes a deep breath.  Iv Morphine and Tussionex ordered.    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 Repeat blood cultures, 08/16/2019, negative Final. Initially treated with broad-spectrum, now on Ancef.              ID recommended Ancef from 08/16/2019 through 08/30/2019 then ID will consider long-acting infusion or oral linezolid. Completing IV antibiotics in-house due to history of IV drug abuse.  Recurrent fever and leukocytosis in the setting of MSSA bacteremia with tricuspid endocarditis/ presumed septic embolism Blood cultures drawn on 08/25/19 peripherally negative to date. Continue to follow cultures. Procalcitonin and lactic acid negative on 1/23. Continue to monitor WBC and fever curve.  Discussed with infectious disease Dr. Linus Salmons 1/23.  Not unlikely to see recurrent fever and leukocytosis in the setting of R sided endocarditis. Re-consulted ID, discussed with Dr. Graylon Good on 08/27/19. Continue Ancef per ID.  Presumed septic pulmonary embolism, likely source of pleuritic chest pain Reports pleuritic pain worse when he attempts to take a deep breath. Tachycardic. Mild drop in O2 saturation down to 96% RA from 100% RA at baseline. Ordered CTA PE but patient refuses IV placement or PICC placement-Not done. VQ scan and TTE could not r/o PE Started on Eliquis on 08/27/19.  Upper GI bleed, unclear etiology Admits to previous hematemesis  but would not elaborate further Had an upper GI bleed, vomitted frank blood about 10cc on the night of 08/27/19.  [facility] consulted, Dr. Watt Climes.  We greatly appreciate GI assistance.  Started on IV protonix 40 mg BID No recurrences at the time of this visit Hg stable 11.0 from 10.3 Baseline  hg 12 Eliquis on hold and NPO until evaluated by GI  Acute blood loss anemia suspect GI source Baseline hg 12 Obtain FOBT Obtain iron studies Hematemesis reported  [place] H&H Repeat CBC and CMP in the AM                Right suprahilar region opacity on chest x-ray, ? PNA from endocarditis Independently reviewed chest x-ray done on 08/25/2019 which showed right suprahilar hilar opacity. Patient is on Ancef for MSSA bacteremia.  Procalcitonin and lactic acid negative. Cultures have been drawn, continue to follow.  Obtain sputum culture if able.  WBC trending down.  Continue Ancef per ID.  Medical noncompliance Patient has refused tests, imaging, treatments intermittently.  Resolved thrombocytopenia: Likely 2/2 to acute illness  History of IV drug abuse: Patient has been using heroin recently.  No evidence of withdrawal.  Does not want any treatment.  Patient states that he can take care of himself.  Schizophrenia/Bipolar disorder:  Continue Seroquel and Haldol at night.  Fall in the hospital Possibly orthosthatic hypotension Orthosthatic vital signs ordered and pending Has declined further evaluation Continue to monitor w fall precautions PT to assess. Patient declines PT assessment stating he does not need PT.  Chronic constipation Continue bowel regimen.   DVT prophylaxis: Eliquis on Hold due to upper GI bleed.  Defer to GI to restart.  Code Status: Full code  Family Communication: Called his mother Ms. Tamu [phone] and left a voicemail message while in the patient's room, with his permission to call on 08/28/19.  Disposition Plan: Remains in the hospital.  Ongoing work up to r/o PE.  Not an outpatient infusion candidate due to active drug abuse.   Consultants:   Infectious disease  Psychiatry  [facility] on 08/28/19  Procedures:   None  Antimicrobials:                Ancef  Objective: Vitals:   08/28/19 0330 08/28/19 0428 08/28/19 0533  08/28/19 0920  BP: 97/60 103/67 114/66 (!) 120/53  Pulse: (!) 106  (!) 106 (!) 116  Resp: (!) 32 (!) 25 (!) 24 (!) 22  Temp: 99.2 F (37.3 C) 98.8 F (37.1 C) 98.9 F (37.2 C) (!) 101.2 F (38.4 C)  TempSrc: Oral Oral Oral Oral  SpO2: 98% 97% 99% 97%  Weight:      Height:        Intake/Output Summary (Last 24 hours) at 08/28/2019 0952 Last data filed at 08/28/2019 0600 Gross per 24 hour  Intake 4142.67 ml  Output --  Net 4142.67 ml   Filed Weights   08/12/19 0000  Weight: 103.4 kg    Exam:  No changes from prior . General: 24 y.o. year-old male well-developed well-nourished no acute distress.  Alert and oriented x3.   . Cardiovascular: Tachycardic with no rubs or gallops.   Marland Kitchen Respiratory: Clear to auscultation with no wheezes or rales. . Abdomen: Normal bowel sounds present.  Mild discomfort in suprapubic area with palpation. .   Musculoskeletal: No lower extremity edema bilaterally. Psychiatry: Mood is irritable.   Data Reviewed: CBC: Recent Labs  Lab 08/23/19 0555 08/25/19 0536 08/26/19 0538 08/28/19  0025 08/28/19 0712  WBC 11.8* 13.2* 10.8* 9.2 9.8  NEUTROABS  --  9.8* 7.9*  --   --   HGB 12.1* 11.8* 10.7* 10.3* 11.0*  HCT 39.5 38.7* 34.2* 32.8* 35.2*  MCV 89.0 88.8 87.2 85.0 86.9  PLT 434* 371 318 330 [id number]   Basic Metabolic Panel: Recent Labs  Lab 08/23/19 0555 08/25/19 0536 08/28/19 0712  NA 138 137 137  K 4.2 3.8 4.0  CL 100 101 103  CO2 27 27 23   GLUCOSE 109* 120* 90  BUN 11 7 6   CREATININE 0.77 0.85 0.60*  CALCIUM 9.5 9.2 8.6*  MG  --  1.9  --    GFR: Estimated Creatinine Clearance: 147.9 mL/min (A) (by C-G formula based on SCr of 0.6 mg/dL (L)). Liver Function Tests: Recent Labs  Lab 08/25/19 0536  AST 20  ALT 25  ALKPHOS 92  BILITOT 0.9  PROT 7.7  ALBUMIN 3.3*   Recent Labs  Lab 08/25/19 0536  LIPASE 23   No results for input(s): AMMONIA in the last 168 hours. Coagulation Profile: No results for input(s): INR, PROTIME in  the last 168 hours. Cardiac Enzymes: No results for input(s): CKTOTAL, CKMB, CKMBINDEX, TROPONINI in the last 168 hours. BNP (last 3 results) No results for input(s): PROBNP in the last 8760 hours. HbA1C: No results for input(s): HGBA1C in the last 72 hours. CBG: No results for input(s): GLUCAP in the last 168 hours. Lipid Profile: No results for input(s): CHOL, HDL, LDLCALC, TRIG, CHOLHDL, LDLDIRECT in the last 72 hours. Thyroid Function Tests: No results for input(s): TSH, T4TOTAL, FREET4, T3FREE, THYROIDAB in the last 72 hours. Anemia Panel: No results for input(s): VITAMINB12, FOLATE, FERRITIN, TIBC, IRON, RETICCTPCT in the last 72 hours. Urine analysis:    Component Value Date/Time   COLORURINE YELLOW 08/10/2019 [place] 08/10/2019 1615   LABSPEC 1.018 08/10/2019 1615   PHURINE 7.0 08/10/2019 1615   GLUCOSEU NEGATIVE 08/10/2019 1615   HGBUR SMALL (A) 08/10/2019 1615   BILIRUBINUR NEGATIVE 08/10/2019 1615   KETONESUR NEGATIVE 08/10/2019 1615   PROTEINUR NEGATIVE 08/10/2019 1615   UROBILINOGEN 2.0 (H) 04/01/2014 2200   NITRITE NEGATIVE 08/10/2019 1615   LEUKOCYTESUR NEGATIVE 08/10/2019 1615   Sepsis Labs: @LABRCNTIP (procalcitonin:4,lacticidven:4)  ) Recent Results (from the past 240 hour(s))  Culture, blood (routine x 2)     Status: None (Preliminary result)   Collection Time: 08/25/19  5:36 AM   Specimen: BLOOD  Result Value Ref Range Status   Specimen Description   Final    BLOOD LEFT ANTECUBITAL Performed at [facility], [address] [place], [place] [address]    Special Requests   Final    BOTTLES DRAWN AEROBIC AND ANAEROBIC Blood Culture adequate volume Performed at [facility], [address] [place], [place] [address]    Culture   Final    NO GROWTH 2 DAYS Performed at [facility], [place] [address] [place], [place] [address]    Report Status PENDING  Incomplete  Culture, blood (routine x 2)     Status:  None (Preliminary result)   Collection Time: 08/25/19  5:46 AM   Specimen: BLOOD  Result Value Ref Range Status   Specimen Description   Final    BLOOD LEFT ARM Performed at [facility], [address] [place], [place] [address]    Special Requests   Final    BOTTLES DRAWN AEROBIC AND ANAEROBIC  Blood Culture results may not be optimal due to an excessive volume of blood received in culture bottles Performed at [facility], [address] [place], [place] [address]    Culture   Final    NO GROWTH 2 DAYS Performed at [facility], [place] [address] [place], [place] [address]    Report Status PENDING  Incomplete      Studies: ECHOCARDIOGRAM LIMITED  Result Date: 08/27/2019   ECHOCARDIOGRAM LIMITED REPORT   Patient Name:   ASAUN BRESTER Date of Exam: 08/27/2019 Medical Rec #:  [id number]        Height:       62.0 in Accession #:    [id number]       Weight:       228.0 lb Date of Birth:  [birth year]        BSA:          2.02 m Patient Age:    23 years         BP:           110/67 mmHg Patient Gender: M                HR:           109 bpm. Exam Location:  Inpatient  Procedure: Limited Echo, Cardiac Doppler and Color Doppler Indications:    I26.02 Pulmonary embolus (suspected)  History:        Patient has prior history of Echocardiogram examinations. IVDU.                 Suspected pulmonary embolism.  Sonographer:    Roseanna Rainbow RDCS Referring Phys: [id number] [place]  Sonographer Comments: Limited echo for right heart strain due to suspected pulmonary embolus. Unable to obtain CTA. IMPRESSIONS  1. Left ventricular ejection fraction, by visual estimation, is 60 to 65%. Left ventricular septal wall thickness was moderately increased. Mildly increased left ventricular posterior wall thickness. There is mildly increased left ventricular wall thickness.  2. The left ventricle has no regional wall motion abnormalities.  3. Global right ventricle has mildly reduced systolic  function.The right ventricular size is moderately enlarged.  4. Hypokinetic right ventricular apex.  5. Trivial mitral valve regurgitation.  6. Tricuspid valve regurgitation is mild.  7. Tricuspid valve regurgitation is mild.  8. Normal pulmonary artery systolic pressure.  9. The inferior vena cava is normal in size with greater than 50% respiratory variability, suggesting right atrial pressure of 3 mmHg. FINDINGS  Left Ventricle: Left ventricular ejection fraction, by visual estimation, is 60 to 65%. The left ventricle has no regional wall motion abnormalities. Left ventricular septal wall thickness was moderately increased. Mildly increased left ventricular posterior wall thickness. There is mildly increased left ventricular wall thickness. Left ventricular diastolic parameters were normal. Normal left atrial pressure. Right Ventricle: The right ventricular size is moderately enlarged. Global RV systolic function is has mildly reduced systolic function. The tricuspid regurgitant velocity is 2.03 m/s, and with an assumed right atrial pressure of 3 mmHg, the estimated right ventricular systolic pressure is normal at 19.5 mmHg. The motion of the right ventricle apex hypokinetic. Mitral Valve: Trivial mitral valve regurgitation. Tricuspid Valve: Tricuspid valve regurgitation is mild. Venous: The inferior vena cava is normal in size with greater than 50% respiratory variability, suggesting right atrial pressure of 3 mmHg.  LEFT VENTRICLE          Normals PLAX 2D LVIDd:  3.80 cm  3.6 cm LVIDs:         2.40 cm  1.7 cm LV PW:         1.20 cm  1.4 cm LV IVS:        1.50 cm  1.3 cm LVOT diam:     2.00 cm  2.0 cm LV SV:         42 ml    79 ml LV SV Index:   19.13    45 ml/m2 LVOT Area:     3.14 cm 3.14 cm2  LV Volumes (MOD)             Normals LV area d, A2C:    22.80 cm LV area d, A4C:    34.70 cm LV area s, A2C:    12.60 cm LV area s, A4C:    19.40 cm LV major d, A2C:   7.28 cm LV major d, A4C:   8.05 cm LV  major s, A2C:   5.61 cm LV major s, A4C:   6.50 cm LV vol d, MOD A2C: 59.8 ml   68 ml LV vol d, MOD A4C: 120.0 ml LV vol s, MOD A2C: 25.0 ml   24 ml LV vol s, MOD A4C: 48.3 ml LV SV MOD A2C:     34.8 ml LV SV MOD A4C:     120.0 ml LV SV MOD BP:      51.7 ml   45 ml RIGHT VENTRICLE             IVC RV S prime:     11.70 cm/s  IVC diam: 1.60 cm TAPSE (M-mode): 1.8 cm LEFT ATRIUM         Index LA diam:    3.40 cm 1.68 cm/m   AORTA                 Normals Ao Root diam: 3.40 cm 31 mm TRICUSPID VALVE             Normals TR Peak grad:   16.5 mmHg TR Vmax:        203.00 cm/s 288 cm/s  SHUNTS Systemic Diam: 2.00 cm  Skeet Latch MD Electronically signed by Skeet Latch MD Signature Date/Time: 08/27/2019/11:29:40 AM    Final     Scheduled Meds: . feeding supplement (ENSURE ENLIVE)  237 mL Oral BID BM  . haloperidol  2 mg Oral QHS  . mouth rinse  15 mL Mouth Rinse BID  . pantoprazole (PROTONIX) IV  40 mg Intravenous Q12H  . polyethylene glycol  17 g Oral BID  . QUEtiapine  300 mg Oral QHS  . senna-docusate  2 tablet Oral BID    Continuous Infusions: . sodium chloride Stopped (08/17/19 2149)  . sodium chloride 100 mL/hr at 08/27/19 2331  . acetaminophen 1,000 mg (08/28/19 0043)  .  ceFAZolin (ANCEF) IV 2 g (08/28/19 [id number])     LOS: 17 days     Kayleen Memos, MD [facility] Pager [phone]  If 7PM-7AM, please contact night-coverage [email or web address] Password [facility] 08/28/2019, 9:52 AM

## 2019-08-28 NOTE — Progress Notes (Signed)
Temp 102.8  Refusing tylenol pr  On call aware

## 2019-08-29 ENCOUNTER — Inpatient Hospital Stay (HOSPITAL_COMMUNITY): Payer: Medicaid Other

## 2019-08-29 ENCOUNTER — Encounter (HOSPITAL_COMMUNITY): Payer: Self-pay | Admitting: Emergency Medicine

## 2019-08-29 LAB — COMPREHENSIVE METABOLIC PANEL
ALT: 23 U/L (ref 0–44)
AST: 13 U/L — ABNORMAL LOW (ref 15–41)
Albumin: 2.8 g/dL — ABNORMAL LOW (ref 3.5–5.0)
Alkaline Phosphatase: 93 U/L (ref 38–126)
Anion gap: 11 (ref 5–15)
BUN: 5 mg/dL — ABNORMAL LOW (ref 6–20)
CO2: 25 mmol/L (ref 22–32)
Calcium: 8.8 mg/dL — ABNORMAL LOW (ref 8.9–10.3)
Chloride: 102 mmol/L (ref 98–111)
Creatinine, Ser: 0.73 mg/dL (ref 0.61–1.24)
GFR calc Af Amer: >60 mL/min (ref >60)
GFR calc non Af Amer: >60 mL/min (ref >60)
Glucose, Bld: 102 mg/dL — ABNORMAL HIGH (ref 70–99)
Potassium: 3.6 mmol/L (ref 3.5–5.1)
Sodium: 138 mmol/L (ref 135–145)
Total Bilirubin: 1.3 mg/dL — ABNORMAL HIGH (ref 0.3–1.2)
Total Protein: 7.3 g/dL (ref 6.5–8.1)

## 2019-08-29 LAB — CBC WITH DIFFERENTIAL/PLATELET
Abs Immature Granulocytes: 0.03 10*3/uL (ref 0.00–0.07)
Basophils Absolute: 0 10*3/uL (ref 0.0–0.1)
Basophils Relative: 1 %
Eosinophils Absolute: 0 10*3/uL (ref 0.0–0.5)
Eosinophils Relative: 0 %
HCT: 33.6 % — ABNORMAL LOW (ref 39.0–52.0)
Hemoglobin: 10.4 g/dL — ABNORMAL LOW (ref 13.0–17.0)
Immature Granulocytes: 0 %
Lymphocytes Relative: 15 %
Lymphs Abs: 1.1 10*3/uL (ref 0.7–4.0)
MCH: 27.2 pg (ref 26.0–34.0)
MCHC: 31 g/dL (ref 30.0–36.0)
MCV: 87.7 fL (ref 80.0–100.0)
Monocytes Absolute: 1.1 10*3/uL — ABNORMAL HIGH (ref 0.1–1.0)
Monocytes Relative: 14 %
Neutro Abs: 5.6 10*3/uL (ref 1.7–7.7)
Neutrophils Relative %: 70 %
Platelets: 276 10*3/uL (ref 150–400)
RBC: 3.83 MIL/uL — ABNORMAL LOW (ref 4.22–5.81)
RDW: 14.1 % (ref 11.5–15.5)
WBC: 7.9 10*3/uL (ref 4.0–10.5)
nRBC: 0 % (ref 0.0–0.2)

## 2019-08-29 LAB — FOLATE RBC
Folate, Hemolysate: 322 ng/mL
Folate, RBC: 982 ng/mL (ref >498)
Hematocrit: 32.8 % — ABNORMAL LOW (ref 37.5–51.0)

## 2019-08-29 LAB — CBC
HCT: 33.4 % — ABNORMAL LOW (ref 39.0–52.0)
Hemoglobin: 10.4 g/dL — ABNORMAL LOW (ref 13.0–17.0)
MCH: 26.9 pg (ref 26.0–34.0)
MCHC: 31.1 g/dL (ref 30.0–36.0)
MCV: 86.3 fL (ref 80.0–100.0)
Platelets: 306 10*3/uL (ref 150–400)
RBC: 3.87 MIL/uL — ABNORMAL LOW (ref 4.22–5.81)
RDW: 13.8 % (ref 11.5–15.5)
WBC: 9.2 10*3/uL (ref 4.0–10.5)
nRBC: 0 % (ref 0.0–0.2)

## 2019-08-29 MED ORDER — MORPHINE SULFATE (PF) 2 MG/ML IV SOLN
2.0000 mg | INTRAVENOUS | Status: AC | PRN
  Administered 2019-08-29 – 2019-08-30 (×6): 2 mg via INTRAVENOUS
  Filled 2019-08-29 (×6): qty 1

## 2019-08-29 MED ORDER — KETOROLAC TROMETHAMINE 30 MG/ML IJ SOLN
30.0000 mg | Freq: Once | INTRAMUSCULAR | Status: AC
  Administered 2019-08-29: 30 mg via INTRAVENOUS
  Filled 2019-08-29: qty 1

## 2019-08-29 MED ORDER — ENOXAPARIN SODIUM 40 MG/0.4ML ~~LOC~~ SOLN
40.0000 mg | SUBCUTANEOUS | Status: DC
  Administered 2019-08-30 – 2019-09-08 (×8): 40 mg via SUBCUTANEOUS
  Filled 2019-08-29 (×10): qty 0.4

## 2019-08-29 MED ORDER — SODIUM CHLORIDE 0.9 % IV BOLUS
500.0000 mL | Freq: Once | INTRAVENOUS | Status: AC
  Administered 2019-08-29: 500 mL via INTRAVENOUS

## 2019-08-29 MED ORDER — IOHEXOL 350 MG/ML SOLN
100.0000 mL | Freq: Once | INTRAVENOUS | Status: AC | PRN
  Administered 2019-08-29: 100 mL via INTRAVENOUS

## 2019-08-29 MED ORDER — SODIUM CHLORIDE (PF) 0.9 % IJ SOLN
INTRAMUSCULAR | Status: AC
  Filled 2019-08-29: qty 50

## 2019-08-29 MED ORDER — PANTOPRAZOLE SODIUM 40 MG PO TBEC
40.0000 mg | DELAYED_RELEASE_TABLET | Freq: Two times a day (BID) | ORAL | Status: DC
  Administered 2019-08-29 – 2019-09-09 (×20): 40 mg via ORAL
  Filled 2019-08-29 (×23): qty 1

## 2019-08-29 NOTE — Progress Notes (Signed)
   08/29/19 0140  Vitals  Temp (!) 102.8 F (39.3 C)  Temp Source Oral  BP (!) 122/47  MAP (mmHg) (!) 64  BP Location Right Arm  BP Method Automatic  Patient Position (if appropriate) Lying  Pulse Rate (!) 125  Pulse Rate Source Monitor  Resp 19  Oxygen Therapy  SpO2 100 %  MEWS Score  MEWS Temp 2  MEWS Systolic 0  MEWS Pulse 2  MEWS RR 0  MEWS LOC 0  MEWS Score 4  MEWS Score Color Red  MEWS Assessment  Is this an acute change? Yes  MEWS guidelines implemented *See [place]  Provider Notification  Provider Name/Title Baltazar Najjar  Date Provider Notified 08/29/19  Time Provider Notified [phone]  Notification Type Call  Notification Reason Change in status  Rapid Response Notification  Name of Rapid Response RN Notified Jennye Moccasin  Date Rapid Response Notified 08/29/19  Time Rapid Response Notified [id number]  Note  Observations c/o fever symptoms  mews red initiated

## 2019-08-29 NOTE — Progress Notes (Signed)
   08/29/19 1822  Vitals  Temp (!) 102.6 F (39.2 C)  Temp Source Oral  BP (!) 126/47  MAP (mmHg) 69  BP Location Left Arm  BP Method Automatic  Patient Position (if appropriate) Lying  Pulse Rate (!) 118  Pulse Rate Source Monitor  Resp 19  Oxygen Therapy  SpO2 95 %  O2 Device Room Air  MEWS Score  MEWS Temp 2  MEWS Systolic 0  MEWS Pulse 2  MEWS RR 0  MEWS LOC 0  MEWS Score 4  MEWS Score Color Red  MEWS Assessment  Is this an acute change? Yes  MEWS guidelines implemented *See [place]  Provider Notification  Provider Name/Title Dr. Lupita Leash, Karin Lieu and Dr. [place] Flattery   Date Provider Notified 08/29/19  Time Provider Notified [phone]  Notification Type Call  Notification Reason Change in status;Requested by patient/family  Response See new orders  Date of Provider Response 08/29/19  Time of Provider Response 1835  Rapid Response Notification  Name of Rapid Response RN Notified Zoe Suggs   Date Rapid Response Notified 08/29/19  Note  Observations .mew  Red MEWS initiated MD aware

## 2019-08-29 NOTE — Progress Notes (Addendum)
[facility] for Infectious Disease    Date of Admission:  08/10/2019     ID: Justin Chaney is a 24 y.o. male with  mssa tv endocarditis c/b pulm septic emboli/pneumonia Principal Problem:   Staphylococcus aureus bacteremia Active Problems:   Schizoaffective disorder, bipolar type (HCC)   Tobacco use   SIRS (systemic inflammatory response syndrome) (HCC)   Normocytic anemia   Thrombocytopenia (HCC)   Polysubstance abuse (HCC)   IVDU (intravenous drug user)   S/P VP shunt   Transaminitis   Hyperbilirubinemia   Sepsis with acute organ dysfunction without septic shock (HCC)    Subjective: Still febrile, and coughing. Underwent CT that shows bilateral patchy pneumonia  Medications:  . chlorpheniramine-HYDROcodone  5 mL Oral Q12H  . [START ON 08/30/2019] enoxaparin (LOVENOX) injection  40 mg Subcutaneous Q24H  . feeding supplement (ENSURE ENLIVE)  237 mL Oral BID BM  . haloperidol  2 mg Oral QHS  . mouth rinse  15 mL Mouth Rinse BID  . pantoprazole  40 mg Oral BID  . polyethylene glycol  17 g Oral BID  . QUEtiapine  300 mg Oral QHS  . senna-docusate  2 tablet Oral BID  . sodium chloride (PF)        Objective: Vital signs in last 24 hours: Temp:  [98.4 F (36.9 C)-102.9 F (39.4 C)] 100.1 F (37.8 C) (01/27 1437) Pulse Rate:  [105-125] 105 (01/27 1437) Resp:  [17-24] 18 (01/27 1437) BP: (102-130)/(47-88) 121/73 (01/27 1437) SpO2:  [92 %-100 %] 97 % (01/27 1437)  Physical Exam  Constitutional: He is oriented to person, place, and time. He appears well-developed and well-nourished. No distress.  HENT:  Mouth/Throat: Oropharynx is clear and moist. No oropharyngeal exudate.  Cardiovascular: Normal rate, regular rhythm and normal heart sounds. Exam reveals no gallop and no friction rub.  No murmur heard.  Pulmonary/Chest: Effort normal and breath sounds normal. No respiratory distress. He has no wheezes.  Abdominal: Soft. Bowel sounds are normal. He exhibits no  distension. There is no tenderness.  Lymphadenopathy:  He has no cervical adenopathy.  Neurological: He is alert and oriented to person, place, and time.  Skin: Skin is warm and dry. No rash noted. No erythema.  Psychiatric: He has a normal mood and affect. His behavior is normal.     Lab Results Recent Labs    08/28/19 0712 08/28/19 1122 08/29/19 0434  WBC 9.8  --  9.2  HGB 11.0*  --  10.4*  HCT 35.2* 32.8* 33.4*  NA 137  --  138  K 4.0  --  3.6  CL 103  --  102  CO2 23  --  25  BUN 6  --  5*  CREATININE 0.60*  --  0.73   Liver Panel Recent Labs    08/29/19 0434  PROT 7.3  ALBUMIN 2.8*  AST 13*  ALT 23  ALKPHOS 93  BILITOT 1.3*    Microbiology: reviewed Studies/Results: CT ANGIO CHEST PE W OR WO CONTRAST  Result Date: 08/29/2019 CLINICAL DATA:  Shortness of breath and chest pain EXAM: CT ANGIOGRAPHY CHEST WITH CONTRAST TECHNIQUE: Multidetector CT imaging of the chest was performed using the standard protocol during bolus administration of intravenous contrast. Multiplanar CT image reconstructions and MIPs were obtained to evaluate the vascular anatomy. CONTRAST:  161mL OMNIPAQUE IOHEXOL 350 MG/ML SOLN COMPARISON:  CT angiogram chest August 11, 2019; perfusion lung scan August 26, 2019 FINDINGS: Cardiovascular: There is no appreciable pulmonary embolus. There  is no thoracic aortic aneurysm or dissection. The visualized great vessels appear unremarkable. There is no appreciable pericardial effusion or pericardial thickening. Mediastinum/Nodes: Thyroid appears unremarkable. There is no appreciable thoracic adenopathy. No esophageal lesions are evident. Lungs/Pleura: There is airspace opacity with consolidation throughout much of the anterior segment of the right upper lobe. More patchy infiltrate is noted in the apical segment of the right upper lobe. There is a right pleural effusion with consolidation in the right base, which in part represents compressive atelectasis.  There is mild consolidation in the anterior and lateral segments of the left lower lobe with posterior left base atelectasis. Upper Abdomen: Visualized upper abdominal structures appear unremarkable. Musculoskeletal: No blastic or lytic bone lesions. No chest wall lesions evident. Review of the MIP images confirms the above findings. IMPRESSION: 1. No appreciable pulmonary embolus. No thoracic aortic aneurysm or dissection. 2. Multifocal pneumonia. Consolidation is greatest in the anterior segment right upper lobe. There is also consolidation in portions of the anterior and lateral segments of the left lower lobe. More ill-defined infiltrate noted in the apical segment right upper lobe. 3. Right pleural effusion with compressive atelectasis in the right base. There may be a degree of superimposed pneumonia in this area as well. 4.  No evident adenopathy. Electronically Signed   By: Lowella Grip III M.D.   On: 08/29/2019 13:10     Assessment/Plan: Persistent fever in the setting of MSSA pneumonia nad TV endocarditis = continue on cefazolin for now. Recommend to check CBC with diff tomorrow. If not trending down over the next 1-2 day, may consider changing course of therapy  MSSA TV endocarditis = plan for a total treatment period of 6 wk, patient is will to do 4 wk of iv therapy - roughly feb 8th    [facility]ious [facility] Cell: [phone] Pager: [phone]  08/29/2019, 5:50 PM

## 2019-08-29 NOTE — Progress Notes (Addendum)
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  In the ER found to be septic with lactic acidosis, leukocytosis and fever and tachycardia and was admitted.  Blood culture grew MSSA and echo showed endocarditis right-sided, seen by infectious disease and has been on antibiotic with plan to continue Ancef from 1/14-1/28-then consider long-acting infusion.  Patient had fall 1/22 without LOC.  He has been having recurrent fever, ID was notified and repeat blood culture ordered 1/23 -Neg so far.  Patient also had pleuritic chest pain troponin negative cannot rule out PE on VQ scan and TTE and placed on Eliquis 1/25 but had episode of vomiting frank blood on 1/25 and GI was consulted.  Patient has been noncompliant with medical management at times refusing blood test imaging and treatments.  Subjective:  C/o rt chest pain, on ars, Saturating well. Report taking CLD. 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  Overnight febrile 102.9. ID recommended Ancef from 08/16/2019 through 08/30/2019 then ID will consider long-acting infusion or oral linezolid. Completing IV antibiotics in-house due to history of IV drug abuse.  ID on 1/25 advised to continue Ancef till February 8 to complete 6 weeks course.  Per Dr Baxter Flattery ID cont ancef till feb 8, pt was agreeable for that- then 2 more wks of  antibiotics ( for total 6 wk) which ID will determine on feb 8.  Recurrent fever and leukocytosis in the setting of MSSA bacteremia, presumed septic embolism: Last blood culture 1/23 - so far.  procal and lactic acid Neg 1/23.  ID Dr Baxter Flattery was reconsulted 1/25 and continued on Ancef overnight again febrile 102.3. wbc stable at 9.2k.  CTA obtained today shows "Multifocal pneumonia. Consolidation is greatest in the anterior segment right upper lobe. There is also consolidation in portions of the anterior and lateral segments of the left lower lobe. More ill-defined infiltrate noted in the apical segment right upper lobe.Right pleural effusion with compressive atelectasis in the right base. There may be a degree of superimposed pneumonia in this area as well" given his recurrent fever will  D/w ID.  Remains on Ancef for now. Discussed w ID and fever likely from pneumonia and plan is to cont current antibiotics  Presumed septic pulmonary embolism/pleuritic chest pain: CT PE was ordered but patient refused IV placement of PICC placement, VQ scan and TTE was done could not rule out PE and was placed on Eliquis 1/25.  CTA does not show PE will discontinue Eliquis.  Episode of frank blood vomiting 1/25/?  Upper GI bleed seen by GI patient  did not want endoscopy, on Protonix PPI twice daily hemoglobin overall stable monitor.  Stopping Eliquis as there is no PE on the CT scan.  Advance diet as tolerated.  Acute blood loss anemia in the setting of chronic anemia: Monitor hemoglobin transfuse as needed. Recent Labs  Lab 08/25/19 0536 08/26/19 0538 08/28/19 0025  08/28/19 0712 08/29/19 0434  HGB 11.8* 10.7* 10.3* 11.0* 10.4*  HCT 38.7* 34.2* 32.8* 35.2* 33.4*    Thrombocytopenia: Resolved  IV drug abuse, has been using heroin recently no evidence of withdrawal, patient did not want any treatment  Schizophrenia/bipolar disorder on Seroquel and Haldol at night  Fall in the hospital, possibly  orthostatic?Marland Kitchen  PT was consulted and patient declined.  Chronic constipation  Medical noncompliance refusing testing care plan  Body mass index is 41.69 kg/m.   DVT prophylaxis: eliquis-> Lovenox Code Status: Full code Family Communication: plan of care discussed with patient at bedside. Dr Velvet Bathe his mother Ms. Tamu [phone] and left a voicemail message while in the patient's room 1/26 Disposition Plan: Remains inpatient pending completion of antibiotic further work-up   Consultants: ID, psychiatry, [facility] Procedures:see note  CTA 1."No appreciable pulmonary embolus. No thoracic aortic aneurysm or dissection.  2. Multifocal pneumonia. Consolidation is greatest in the anterior segment right upper lobe. There is also consolidation in portions of the anterior and lateral segments of the left lower lobe. More ill-defined infiltrate noted in the apical segment right upper lobe.  3. Right pleural effusion with compressive atelectasis in the right base. There may be a degree of superimposed pneumonia in this area as well.  4.  No evident adenopathy"   VQ scan 1/24  IMPRESSION: Wedge-shaped perfusion defect in the anterior right upper lobe corresponding to airspace disease seen on chest x-ray. Without ventilation, this is indeterminate for pulmonary embolus. Recommend CTA chest once adequate IV access is obtained.  Microbiology: Antimicrobials: Anti-infectives (From admission, onward)   Start     Dose/Rate Route Frequency Ordered Stop   08/12/19 1800  ceFAZolin (ANCEF) IVPB 2g/100 mL premix     2 g 200 mL/hr over 30 Minutes Intravenous Every 8 hours 08/12/19 1251     08/11/19 1400  metroNIDAZOLE (FLAGYL) IVPB 500 mg  Status:  Discontinued     500 mg 100 mL/hr over 60 Minutes Intravenous Every 8 hours 08/11/19 0556 08/12/19 1229   08/11/19 1000  ceFEPIme (MAXIPIME) 2 g in sodium chloride 0.9 % 100 mL IVPB  Status:  Discontinued     2 g 200 mL/hr over 30  Minutes Intravenous Every 8 hours 08/11/19 0330 08/12/19 1229   08/11/19 0600  vancomycin (VANCOREADY) IVPB 1750 mg/350 mL  Status:  Discontinued     1,750 mg 175 mL/hr over 120 Minutes Intravenous Every 12 hours 08/11/19 0330 08/11/19 0335   08/11/19 0600  vancomycin (VANCOREADY) IVPB 1250 mg/250 mL  Status:  Discontinued     1,250 mg 166.7 mL/hr over 90 Minutes Intravenous Every 12 hours 08/11/19 0335 08/13/19 0928   08/11/19 0200  ceFEPIme (MAXIPIME) 2 g in sodium chloride 0.9 % 100 mL IVPB     2 g 200 mL/hr over 30 Minutes Intravenous  Once 08/11/19 0149 08/11/19 0257   08/11/19 0145  metroNIDAZOLE (FLAGYL) IVPB 500 mg     500 mg 100 mL/hr over 60 Minutes Intravenous  Once 08/11/19 0135 08/11/19 0623   08/11/19 0145  vancomycin (VANCOCIN) IVPB 1000 mg/200 mL premix     1,000 mg 200 mL/hr over 60 Minutes Intravenous  Once 08/11/19 0142 08/11/19 0509      Medications: Scheduled Meds: . apixaban  10 mg Oral BID   Followed by  . [START ON 09/05/2019] apixaban  5 mg Oral BID  . chlorpheniramine-HYDROcodone  5 mL Oral Q12H  . feeding supplement (ENSURE ENLIVE)  237 mL Oral BID  BM  . haloperidol  2 mg Oral QHS  . mouth rinse  15 mL Mouth Rinse BID  . pantoprazole (PROTONIX) IV  40 mg Intravenous Q12H  . polyethylene glycol  17 g Oral BID  . QUEtiapine  300 mg Oral QHS  . senna-docusate  2 tablet Oral BID   Continuous Infusions: . sodium chloride Stopped (08/17/19 2149)  . sodium chloride 75 mL/hr at 08/28/19 2104  .  ceFAZolin (ANCEF) IV 2 g (08/29/19 0203)    Objective: Vitals:   08/29/19 0256 08/29/19 0330 08/29/19 0436 08/29/19 0632  BP: (!) 117/54 116/67 104/74 116/71  Pulse: (!) 116 (!) 115  (!) 105  Resp: 18 18 18 17   Temp: (!) 102.3 F (39.1 C) (!) 100.8 F (38.2 C) 99.4 F (37.4 C) 99.1 F (37.3 C)  TempSrc: Oral Oral Oral Oral  SpO2: 97% 100% 94% 95%  Weight:      Height:        Intake/Output Summary (Last 24 hours) at 08/29/2019 0811 Last data filed at  08/29/2019 0744 Gross per 24 hour  Intake 2450.87 ml  Output 700 ml  Net 1750.87 ml   Filed Weights   08/12/19 0000  Weight: 103.4 kg   Weight change:   Body mass index is 41.69 kg/m.  Intake/Output from previous day: 01/26 0701 - 01/27 0700 In: 2450.9 [I.V.:461.7; IV Piggyback:1989.2] Out: 350 [Urine:350] Intake/Output this shift: Total I/O In: -  Out: 350 [Urine:350]  Examination:  General exam: AAOx3, obese, on ra, in mild pain HEENT:Oral mucosa moist, Ear/Nose WNL grossly, dentition normal. Respiratory system: Diminished bs b/l mostly in the right upper lobe with crackles, no wheezing,no use of accessory muscle Cardiovascular system: S1 & S2 +, No JVD,. Gastrointestinal system: Abdomen soft, NT,ND, BS+ Nervous System:Alert, awake, moving extremities and grossly nonfocal Extremities: No edema, distal peripheral pulses palpable.  Skin: No rashes,no icterus. MSK: Normal muscle bulk,tone, power   Data Reviewed: I have personally reviewed following labs and imaging studies  CBC: Recent Labs  Lab 08/25/19 0536 08/26/19 0538 08/28/19 0025 08/28/19 0712 08/29/19 0434  WBC 13.2* 10.8* 9.2 9.8 9.2  NEUTROABS 9.8* 7.9*  --   --   --   HGB 11.8* 10.7* 10.3* 11.0* 10.4*  HCT 38.7* 34.2* 32.8* 35.2* 33.4*  MCV 88.8 87.2 85.0 86.9 86.3  PLT 371 318 330 300 [id number]   Basic Metabolic Panel: Recent Labs  Lab 08/23/19 0555 08/25/19 0536 08/28/19 0712 08/29/19 0434  NA 138 137 137 138  K 4.2 3.8 4.0 3.6  CL 100 101 103 102  CO2 27 27 23 25   GLUCOSE 109* 120* 90 102*  BUN 11 7 6  5*  CREATININE 0.77 0.85 0.60* 0.73  CALCIUM 9.5 9.2 8.6* 8.8*  MG  --  1.9  --   --    GFR: Estimated Creatinine Clearance: 147.9 mL/min (by C-G formula based on SCr of 0.73 mg/dL). Liver Function Tests: Recent Labs  Lab 08/25/19 0536 08/29/19 0434  AST 20 13*  ALT 25 23  ALKPHOS 92 93  BILITOT 0.9 1.3*  PROT 7.7 7.3  ALBUMIN 3.3* 2.8*   Recent Labs  Lab 08/25/19 0536  LIPASE  23   No results for input(s): AMMONIA in the last 168 hours. Coagulation Profile: No results for input(s): INR, PROTIME in the last 168 hours. Cardiac Enzymes: No results for input(s): CKTOTAL, CKMB, CKMBINDEX, TROPONINI in the last 168 hours. BNP (last 3 results) No results for input(s): PROBNP in the last 8760  hours. HbA1C: No results for input(s): HGBA1C in the last 72 hours. CBG: No results for input(s): GLUCAP in the last 168 hours. Lipid Profile: No results for input(s): CHOL, HDL, LDLCALC, TRIG, CHOLHDL, LDLDIRECT in the last 72 hours. Thyroid Function Tests: No results for input(s): TSH, T4TOTAL, FREET4, T3FREE, THYROIDAB in the last 72 hours. Anemia Panel: Recent Labs    08/28/19 0711  VITAMINB12 558  FERRITIN 459*  TIBC 208*  IRON 23*   Sepsis Labs: Recent Labs  Lab 08/25/19 0536 08/25/19 0545 08/28/19 0302  PROCALCITON <0.10  --   --   LATICACIDVEN  --  1.3 0.7    Recent Results (from the past 240 hour(s))  Culture, blood (routine x 2)     Status: None (Preliminary result)   Collection Time: 08/25/19  5:36 AM   Specimen: BLOOD  Result Value Ref Range Status   Specimen Description   Final    BLOOD LEFT ANTECUBITAL Performed at [facility], [address] [place], [place] [address]    Special Requests   Final    BOTTLES DRAWN AEROBIC AND ANAEROBIC Blood Culture adequate volume Performed at [facility], [address] [place], [place] [address]    Culture   Final    NO GROWTH 3 DAYS Performed at [facility], [place] [address], [place], [place] [address]    Report Status PENDING  Incomplete  Culture, blood (routine x 2)     Status: None (Preliminary result)   Collection Time: 08/25/19  5:46 AM   Specimen: BLOOD  Result Value Ref Range Status   Specimen Description   Final    BLOOD LEFT ARM Performed at [facility], [address] [place], [place] [address]    Special Requests   Final    BOTTLES DRAWN  AEROBIC AND ANAEROBIC Blood Culture results may not be optimal due to an excessive volume of blood received in culture bottles Performed at [facility], [address] [place], [place] [address]    Culture   Final    NO GROWTH 3 DAYS Performed at [facility], [place] [address], [place], [place] [address]    Report Status PENDING  Incomplete      Radiology Studies: ECHOCARDIOGRAM LIMITED  Result Date: 08/27/2019   ECHOCARDIOGRAM LIMITED REPORT   Patient Name:   SEYMOUR BENNETTS Date of Exam: 08/27/2019 Medical Rec #:  [id number]        Height:       62.0 in Accession #:    [id number]       Weight:       228.0 lb Date of Birth:  Sep 25, 1995        BSA:          2.02 m Patient Age:    23 years         BP:           110/67 mmHg Patient Gender: M                HR:           109 bpm. Exam Location:  Inpatient  Procedure: Limited Echo, Cardiac Doppler and Color Doppler Indications:    I26.02 Pulmonary embolus (suspected)  History:        Patient has prior history of Echocardiogram examinations. IVDU.                 Suspected pulmonary  embolism.  Sonographer:    Roseanna Rainbow RDCS Referring Phys: [id number] [place]  Sonographer Comments: Limited echo for right heart strain due to suspected pulmonary embolus. Unable to obtain CTA. IMPRESSIONS  1. Left ventricular ejection fraction, by visual estimation, is 60 to 65%. Left ventricular septal wall thickness was moderately increased. Mildly increased left ventricular posterior wall thickness. There is mildly increased left ventricular wall thickness.  2. The left ventricle has no regional wall motion abnormalities.  3. Global right ventricle has mildly reduced systolic function.The right ventricular size is moderately enlarged.  4. Hypokinetic right ventricular apex.  5. Trivial mitral valve regurgitation.  6. Tricuspid valve regurgitation is mild.  7. Tricuspid valve regurgitation is mild.  8. Normal pulmonary artery systolic pressure.  9. The  inferior vena cava is normal in size with greater than 50% respiratory variability, suggesting right atrial pressure of 3 mmHg. FINDINGS  Left Ventricle: Left ventricular ejection fraction, by visual estimation, is 60 to 65%. The left ventricle has no regional wall motion abnormalities. Left ventricular septal wall thickness was moderately increased. Mildly increased left ventricular posterior wall thickness. There is mildly increased left ventricular wall thickness. Left ventricular diastolic parameters were normal. Normal left atrial pressure. Right Ventricle: The right ventricular size is moderately enlarged. Global RV systolic function is has mildly reduced systolic function. The tricuspid regurgitant velocity is 2.03 m/s, and with an assumed right atrial pressure of 3 mmHg, the estimated right ventricular systolic pressure is normal at 19.5 mmHg. The motion of the right ventricle apex hypokinetic. Mitral Valve: Trivial mitral valve regurgitation. Tricuspid Valve: Tricuspid valve regurgitation is mild. Venous: The inferior vena cava is normal in size with greater than 50% respiratory variability, suggesting right atrial pressure of 3 mmHg.  LEFT VENTRICLE          Normals PLAX 2D LVIDd:         3.80 cm  3.6 cm LVIDs:         2.40 cm  1.7 cm LV PW:         1.20 cm  1.4 cm LV IVS:        1.50 cm  1.3 cm LVOT diam:     2.00 cm  2.0 cm LV SV:         42 ml    79 ml LV SV Index:   19.13    45 ml/m2 LVOT Area:     3.14 cm 3.14 cm2  LV Volumes (MOD)             Normals LV area d, A2C:    22.80 cm LV area d, A4C:    34.70 cm LV area s, A2C:    12.60 cm LV area s, A4C:    19.40 cm LV major d, A2C:   7.28 cm LV major d, A4C:   8.05 cm LV major s, A2C:   5.61 cm LV major s, A4C:   6.50 cm LV vol d, MOD A2C: 59.8 ml   68 ml LV vol d, MOD A4C: 120.0 ml LV vol s, MOD A2C: 25.0 ml   24 ml LV vol s, MOD A4C: 48.3 ml LV SV MOD A2C:     34.8 ml LV SV MOD A4C:     120.0 ml LV SV MOD BP:      51.7 ml   45 ml RIGHT VENTRICLE              IVC RV S prime:  11.70 cm/s  IVC diam: 1.60 cm TAPSE (M-mode): 1.8 cm LEFT ATRIUM         Index LA diam:    3.40 cm 1.68 cm/m   AORTA                 Normals Ao Root diam: 3.40 cm 31 mm TRICUSPID VALVE             Normals TR Peak grad:   16.5 mmHg TR Vmax:        203.00 cm/s 288 cm/s  SHUNTS Systemic Diam: 2.00 cm  Skeet Latch MD Electronically signed by Skeet Latch MD Signature Date/Time: 08/27/2019/11:29:40 AM    Final       LOS: 18 days   Time spent: More than 50% of that time was spent in counseling and/or coordination of care.  Antonieta Pert, MD [facility]  08/29/2019, 8:11 AM

## 2019-08-30 LAB — CULTURE, BLOOD (ROUTINE X 2)
Culture: NO GROWTH
Culture: NO GROWTH
Special Requests: ADEQUATE

## 2019-08-30 MED ORDER — MAGNESIUM CITRATE PO SOLN
1.0000 | Freq: Once | ORAL | Status: AC
  Administered 2019-08-30: 14:00:00 1 via ORAL

## 2019-08-30 NOTE — Progress Notes (Signed)
[facility] for Infectious Disease    Date of Admission:  08/10/2019   Total days of antibiotics           ID: Jacian Chaney is a 24 y.o. male with mssa tv endocarditis and pulmonary pneumonia Principal Problem:   Staphylococcus aureus bacteremia Active Problems:   Schizoaffective disorder, bipolar type (HCC)   Tobacco use   SIRS (systemic inflammatory response syndrome) (HCC)   Normocytic anemia   Thrombocytopenia (HCC)   Polysubstance abuse (HCC)   IVDU (intravenous drug user)   S/P VP shunt   Transaminitis   Hyperbilirubinemia   Sepsis with acute organ dysfunction without septic shock (HCC)    Subjective: Had high persistent fevers overnight but now improved. Repeat blood cx were taken and blood work WBC not elevated, no eosinophilia. Feeling better no fevers this morning. He is trying to use incentive spirometer more often  Medications:  . chlorpheniramine-HYDROcodone  5 mL Oral Q12H  . enoxaparin (LOVENOX) injection  40 mg Subcutaneous Q24H  . feeding supplement (ENSURE ENLIVE)  237 mL Oral BID BM  . haloperidol  2 mg Oral QHS  . mouth rinse  15 mL Mouth Rinse BID  . pantoprazole  40 mg Oral BID  . polyethylene glycol  17 g Oral BID  . QUEtiapine  300 mg Oral QHS  . senna-docusate  2 tablet Oral BID    Objective: Vital signs in last 24 hours: Temp:  [98.4 F (36.9 C)-102.6 F (39.2 C)] 98.6 F (37 C) (01/28 1356) Pulse Rate:  [92-118] 102 (01/28 1356) Resp:  [15-24] 18 (01/28 1356) BP: (105-126)/(47-69) 105/50 (01/28 1356) SpO2:  [95 %-100 %] 96 % (01/28 1457) Physical Exam  Constitutional: He is oriented to person, place, and time. He appears well-developed and well-nourished. No distress.  HENT:  Mouth/Throat: Oropharynx is clear and moist. No oropharyngeal exudate.  Cardiovascular: Normal rate, regular rhythm and normal heart sounds. Exam reveals no gallop and no friction rub.  No murmur heard.  Pulmonary/Chest: Effort normal and breath sounds  normal. No respiratory distress. He has no wheezes.  Abdominal: Soft. Bowel sounds are normal. He exhibits no distension. There is no tenderness.  Lymphadenopathy:  He has no cervical adenopathy.  Neurological: He is alert and oriented to person, place, and time.  Skin: Skin is warm and dry. No rash noted. No erythema.  Psychiatric: He has a normal mood and affect. His behavior is normal.     Lab Results Recent Labs    08/28/19 0712 08/28/19 1122 08/29/19 0434 08/29/19 1913  WBC 9.8   < > 9.2 7.9  HGB 11.0*   < > 10.4* 10.4*  HCT 35.2*   < > 33.4* 33.6*  NA 137  --  138  --   K 4.0  --  3.6  --   CL 103  --  102  --   CO2 23  --  25  --   BUN 6  --  5*  --   CREATININE 0.60*  --  0.73  --    < > = values in this interval not displayed.   Liver Panel Recent Labs    08/29/19 0434  PROT 7.3  ALBUMIN 2.8*  AST 13*  ALT 23  ALKPHOS 93  BILITOT 1.3*   Microbiology: 1/27 blood cx NGTD -at < 24hr Studies/Results: CT ANGIO CHEST PE W OR WO CONTRAST  Result Date: 08/29/2019 CLINICAL DATA:  Shortness of breath and chest pain EXAM: CT ANGIOGRAPHY  CHEST WITH CONTRAST TECHNIQUE: Multidetector CT imaging of the chest was performed using the standard protocol during bolus administration of intravenous contrast. Multiplanar CT image reconstructions and MIPs were obtained to evaluate the vascular anatomy. CONTRAST:  118mL OMNIPAQUE IOHEXOL 350 MG/ML SOLN COMPARISON:  CT angiogram chest August 11, 2019; perfusion lung scan August 26, 2019 FINDINGS: Cardiovascular: There is no appreciable pulmonary embolus. There is no thoracic aortic aneurysm or dissection. The visualized great vessels appear unremarkable. There is no appreciable pericardial effusion or pericardial thickening. Mediastinum/Nodes: Thyroid appears unremarkable. There is no appreciable thoracic adenopathy. No esophageal lesions are evident. Lungs/Pleura: There is airspace opacity with consolidation throughout much of the  anterior segment of the right upper lobe. More patchy infiltrate is noted in the apical segment of the right upper lobe. There is a right pleural effusion with consolidation in the right base, which in part represents compressive atelectasis. There is mild consolidation in the anterior and lateral segments of the left lower lobe with posterior left base atelectasis. Upper Abdomen: Visualized upper abdominal structures appear unremarkable. Musculoskeletal: No blastic or lytic bone lesions. No chest wall lesions evident. Review of the MIP images confirms the above findings. IMPRESSION: 1. No appreciable pulmonary embolus. No thoracic aortic aneurysm or dissection. 2. Multifocal pneumonia. Consolidation is greatest in the anterior segment right upper lobe. There is also consolidation in portions of the anterior and lateral segments of the left lower lobe. More ill-defined infiltrate noted in the apical segment right upper lobe. 3. Right pleural effusion with compressive atelectasis in the right base. There may be a degree of superimposed pneumonia in this area as well. 4.  No evident adenopathy. Electronically Signed   By: Lowella Grip III M.D.   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 Continue with cefazolin. Will watch to see his fever curve is trending down. If persistent fever, consider repeat CXR to see if he has effusion that would require thoracentesis/ concern for empyema  Will follow up on repeat blood cx from yesterday.  Plan to do iv cefazolin through feb 8th, then give oritavancin/dalbavancin for long acting medication vs linezolid to complete 6 wk  [facility] for Infectious Diseases Cell: [phone] Pager: [phone]  08/30/2019, 4:09 PM

## 2019-08-30 NOTE — Progress Notes (Signed)
   08/30/19 1203  MEWS Score  Temp 98.9 F (37.2 C)  BP (!) 116/57  Pulse Rate 99  Resp 18  SpO2 95 %  O2 Device Room Air  MEWS Score  MEWS Temp 0  MEWS Systolic 0  MEWS Pulse 0  MEWS RR 0  MEWS LOC 0  MEWS Score 0  MEWS Score Color Green  Patient is no longer in red/yellow mews.  Vitals are stable but will continue to be monitored.

## 2019-08-30 NOTE — Progress Notes (Signed)
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  In the ER found to be septic with lactic acidosis, leukocytosis and fever and tachycardia and was admitted.  Blood culture grew MSSA and echo showed endocarditis right-sided, seen by infectious disease and has been on antibiotic with plan to continue Ancef from 1/14-1/28-then consider long-acting infusion.  Patient had fall 1/22 without LOC.  He has been having recurrent fever, ID was notified and repeat blood culture ordered 1/23 -Neg so far.  Patient also had pleuritic chest pain troponin negative cannot rule out PE on VQ scan and TTE and placed on Eliquis 1/25 but had episode of vomiting frank blood on 1/25 and GI was consulted.  Patient has been noncompliant with medical management at times refusing blood test imaging and treatments. Patient agreed for CTA chest 1/27-that showed patchy bilateral pneumonia, no PE.   Subjective: Seen this morning reports he feels much improved today.  Has mild pleuritic chest pain, complains of constipation wants stronger laxatives.  On room air denies any shortness of breath. No episode of nausea vomiting or any kind of bleeding.  Wants to advance to soft diet. 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  Intermittent fever repeat blood culture sent 1/27, seen by ID again. Cont Ancef ( from 08/16/2019)- pt  agreed to continue till feb 8. ID following and has advised to continue Ancef till roughly till February 8 -which will 4 wk , but will need total 6 wks- ID will decide around feb 8 for further plan.  Recurrent fever and leukocytosis in the setting of MSSA bacteremia, bilateral pneumonia seen in the CTA chest 1/27 but no PE.  Repeat blood culture ordered 1/27 due to recurrent fever, ID on board.  May change the course depending upon his clinical presentation monitor for next couple of days.  CTA chest 1/27  "Multifocal pneumonia. Consolidation is greatest in the anterior segment right upper lobe. There is also consolidation in portions of the anterior and lateral segments of the left lower lobe. More ill-defined infiltrate noted in the apical segment right upper lobe.Right pleural effusion with compressive atelectasis in the right base. There may be a degree of superimposed pneumonia in this area as well"   Pleuritic chest pain: No PE.  VQ scan was indeterminate and given this presentation patient was empirically placed on anticoagulation with Eliquis for several days  and has been discontinued 1/27 after negative CTA chest CT for PE.  Episode of frank blood vomiting 1/25/?  Upper GI bleed seen by GI patient  did not want endoscopy, on Protonix PPI twice daily hemoglobin overall stable monitor. Off Eliquis as there is no PE on the CT scan.  Advance diet as tolerated.  Anemia suspecting due to chronic disease, some component Acute blood loss anemia. Hb stable. Recent Labs  Lab 08/26/19 [id number] 08/26/19 0538 08/28/19 0025 08/28/19 [id number] 08/28/19 1122 08/29/19 0434 08/29/19  1913  HGB 10.7*  --  10.3* 11.0*  --  10.4* 10.4*  HCT 34.2*   < > 32.8* 35.2* 32.8* 33.4* 33.6*   < > = values in this interval not displayed.   Constipation continue stool softener add mag citrate.  Thrombocytopenia: Resolved  IV drug abuse, has been using heroin recently no evidence of withdrawal, patient did not want any  treatment  Schizophrenia/bipolar disorder on Seroquel and Haldol at night. Stable modd.  Fall in the hospital, possibly orthostatic?Marland Kitchen  PT was consulted and patient declined.  Medical noncompliance refusing testing care plan  Body mass index is 41.69 kg/m.   DVT prophylaxis: eliquis-> Lovenox Code Status: Full code Family Communication: plan of care discussed with patient at bedside. Dr Velvet Bathe his mother Ms. Tamu [phone] and left a voicemail message while in the patient's room 1/26. Disposition Plan: Remains inpatient pending completion of iv antibiotic, improvement in the fever.  Unable to discharge with PICC line given IV drug abuse.  Consultants: ID, psychiatry, [facility] Procedures:see note  CTA 1."No appreciable pulmonary embolus. No thoracic aortic aneurysm or dissection.  2. Multifocal pneumonia. Consolidation is greatest in the anterior segment right upper lobe. There is also consolidation in portions of the anterior and lateral segments of the left lower lobe. More ill-defined infiltrate noted in the apical segment right upper lobe.  3. Right pleural effusion with compressive atelectasis in the right base. There may be a degree of superimposed pneumonia in this area as well.  4.  No evident adenopathy"   VQ scan 1/24  IMPRESSION: Wedge-shaped perfusion defect in the anterior right upper lobe corresponding to airspace disease seen on chest x-ray. Without ventilation, this is indeterminate for pulmonary embolus. Recommend CTA chest once adequate IV access is obtained.  Microbiology: Antimicrobials: Anti-infectives (From admission, onward)   Start     Dose/Rate Route Frequency Ordered Stop   08/12/19 1800  ceFAZolin (ANCEF) IVPB 2g/100 mL premix     2 g 200 mL/hr over 30 Minutes Intravenous Every 8 hours 08/12/19 1251     08/11/19 1400  metroNIDAZOLE (FLAGYL) IVPB 500 mg  Status:  Discontinued     500 mg 100 mL/hr over 60 Minutes Intravenous Every  8 hours 08/11/19 0556 08/12/19 1229   08/11/19 1000  ceFEPIme (MAXIPIME) 2 g in sodium chloride 0.9 % 100 mL IVPB  Status:  Discontinued     2 g 200 mL/hr over 30 Minutes Intravenous Every 8 hours 08/11/19 0330 08/12/19 1229   08/11/19 0600  vancomycin (VANCOREADY) IVPB 1750 mg/350 mL  Status:  Discontinued     1,750 mg 175 mL/hr over 120 Minutes Intravenous Every 12 hours 08/11/19 0330 08/11/19 0335   08/11/19 0600  vancomycin (VANCOREADY) IVPB 1250 mg/250 mL  Status:  Discontinued     1,250 mg 166.7 mL/hr over 90 Minutes Intravenous Every 12 hours 08/11/19 0335 08/13/19 0928   08/11/19 0200  ceFEPIme (MAXIPIME) 2 g in sodium chloride 0.9 % 100 mL IVPB     2 g 200 mL/hr over 30 Minutes Intravenous  Once 08/11/19 0149 08/11/19 0257   08/11/19 0145  metroNIDAZOLE (FLAGYL) IVPB 500 mg     500 mg 100 mL/hr over 60 Minutes Intravenous  Once 08/11/19 0135 08/11/19 0623   08/11/19 0145  vancomycin (VANCOCIN) IVPB 1000 mg/200 mL premix     1,000 mg 200 mL/hr over 60 Minutes Intravenous  Once 08/11/19 0142 08/11/19 0509      Medications: Scheduled Meds: . chlorpheniramine-HYDROcodone  5 mL Oral Q12H  . enoxaparin (LOVENOX) injection  40 mg Subcutaneous Q24H  . feeding supplement (ENSURE ENLIVE)  237 mL Oral BID BM  . haloperidol  2 mg Oral QHS  . mouth rinse  15 mL Mouth Rinse BID  . pantoprazole  40 mg Oral BID  . polyethylene glycol  17 g Oral BID  . QUEtiapine  300 mg Oral QHS  . senna-docusate  2 tablet Oral BID   Continuous Infusions: . sodium chloride Stopped (08/17/19 2149)  . sodium chloride 100 mL/hr at 08/30/19 0124  .  ceFAZolin (ANCEF) IV 2 g (08/30/19 0129)    Objective: Vitals:   08/29/19 2336 08/30/19 0130 08/30/19 0238 08/30/19 0335  BP: 118/67 118/69 114/66 (!) 117/59  Pulse: 95 92 99 (!) 103  Resp: 19 18 18 15   Temp: 98.4 F (36.9 C) 98.9 F (37.2 C) 99.5 F (37.5 C) 99.4 F (37.4 C)  TempSrc: Oral Oral Oral Oral  SpO2: 98% 97% 96% 97%  Weight:        Height:        Intake/Output Summary (Last 24 hours) at 08/30/2019 0805 Last data filed at 08/29/2019 1800 Gross per 24 hour  Intake 600 ml  Output --  Net 600 ml   Filed Weights   08/12/19 0000  Weight: 103.4 kg   Weight change:   Body mass index is 41.69 kg/m.  Intake/Output from previous day: 01/27 0701 - 01/28 0700 In: 600 [P.O.:600] Out: 350 [Urine:350] Intake/Output this shift: No intake/output data recorded.  Examination:  General exam: AAOx3, obese, on room air, not in acute distress.   HEENT:Oral mucosa moist, Ear/Nose WNL grossly, dentition normal. Respiratory system: Bilateral diminished breath sounds, mild crackles on the right lung,no use of accessory muscle Cardiovascular system: S1 & S2 +, No JVD,. Gastrointestinal system: Abdomen soft, NT,ND, BS+ Nervous System:Alert, awake, moving extremities and grossly nonfocal Extremities: No edema, distal peripheral pulses palpable.  Skin: No rashes,no icterus. MSK: Normal muscle bulk,tone, power   Data Reviewed: I have personally reviewed following labs and imaging studies  CBC: Recent Labs  Lab 08/25/19 0536 08/25/19 0536 08/26/19 0538 08/26/19 0538 08/28/19 0025 08/28/19 0712 08/28/19 1122 08/29/19 0434 08/29/19 1913  WBC 13.2*   < > 10.8*  --  9.2 9.8  --  9.2 7.9  NEUTROABS 9.8*  --  7.9*  --   --   --   --   --  5.6  HGB 11.8*   < > 10.7*  --  10.3* 11.0*  --  10.4* 10.4*  HCT 38.7*   < > 34.2*   < > 32.8* 35.2* 32.8* 33.4* 33.6*  MCV 88.8   < > 87.2  --  85.0 86.9  --  86.3 87.7  PLT 371   < > 318  --  330 300  --  306 276   < > = values in this interval not displayed.   Basic Metabolic Panel: Recent Labs  Lab 08/25/19 0536 08/28/19 0712 08/29/19 0434  NA 137 137 138  K 3.8 4.0 3.6  CL 101 103 102  CO2 27 23 25   GLUCOSE 120* 90 102*  BUN 7 6 5*  CREATININE 0.85 0.60* 0.73  CALCIUM 9.2 8.6* 8.8*  MG 1.9  --   --    GFR: Estimated Creatinine Clearance: 147.9 mL/min (by C-G formula  based on SCr of 0.73 mg/dL). Liver Function Tests: Recent Labs  Lab 08/25/19 0536 08/29/19 0434  AST 20 13*  ALT 25 23  ALKPHOS 92 93  BILITOT 0.9 1.3*  PROT 7.7 7.3  ALBUMIN 3.3* 2.8*   Recent Labs  Lab 08/25/19 0536  LIPASE 23   No results for input(s): AMMONIA in the last 168 hours. Coagulation Profile: No results for input(s): INR, PROTIME in the last 168 hours. Cardiac Enzymes: No results for input(s): CKTOTAL, CKMB, CKMBINDEX, TROPONINI in the last 168 hours. BNP (last 3 results) No results for input(s): PROBNP in the last 8760 hours. HbA1C: No results for input(s): HGBA1C in the last 72 hours. CBG: No results for input(s): GLUCAP in the last 168 hours. Lipid Profile: No results for input(s): CHOL, HDL, LDLCALC, TRIG, CHOLHDL, LDLDIRECT in the last 72 hours. Thyroid Function Tests: No results for input(s): TSH, T4TOTAL, FREET4, T3FREE, THYROIDAB in the last 72 hours. Anemia Panel: Recent Labs    08/28/19 0711  VITAMINB12 558  FERRITIN 459*  TIBC 208*  IRON 23*   Sepsis Labs: Recent Labs  Lab 08/25/19 0536 08/25/19 0545 08/28/19 0302  PROCALCITON <0.10  --   --   LATICACIDVEN  --  1.3 0.7    Recent Results (from the past 240 hour(s))  Culture, blood (routine x 2)     Status: None (Preliminary result)   Collection Time: 08/25/19  5:36 AM   Specimen: BLOOD  Result Value Ref Range Status   Specimen Description   Final    BLOOD LEFT ANTECUBITAL Performed at [facility], [address] [place], [place] [address]    Special Requests   Final    BOTTLES DRAWN AEROBIC AND ANAEROBIC Blood Culture adequate volume Performed at [facility], [address], [place], [place] [address]    Culture   Final    NO GROWTH 4 DAYS Performed at [facility], [place] [address], [place], [place] [address]    Report Status PENDING  Incomplete  Culture, blood (routine x 2)     Status: None (Preliminary result)   Collection Time: 08/25/19   5:46 AM   Specimen: BLOOD  Result Value Ref Range Status   Specimen Description   Final    BLOOD LEFT ARM Performed at [facility], [address] [place], [place] [address]    Special Requests   Final    BOTTLES DRAWN AEROBIC AND ANAEROBIC Blood Culture results may not be optimal due to an excessive volume of blood received in culture bottles Performed at [facility], [address] [place], [place] [address]    Culture   Final    NO GROWTH 4 DAYS Performed at [facility], [place] [address], [place], [place] [address]    Report Status PENDING  Incomplete      Radiology Studies: CT ANGIO CHEST PE W OR WO CONTRAST  Result Date: 08/29/2019 CLINICAL DATA:  Shortness of breath and chest pain EXAM: CT ANGIOGRAPHY CHEST WITH CONTRAST TECHNIQUE: Multidetector CT imaging of the chest was performed using the standard protocol during bolus administration of intravenous contrast. Multiplanar CT image reconstructions and MIPs were obtained to evaluate the vascular anatomy. CONTRAST:  137mL OMNIPAQUE IOHEXOL 350 MG/ML SOLN COMPARISON:  CT angiogram chest August 11, 2019; perfusion lung scan August 26, 2019 FINDINGS: Cardiovascular: There is no appreciable pulmonary embolus. There is no thoracic aortic aneurysm or dissection. The visualized great vessels appear unremarkable. There is no appreciable pericardial effusion or pericardial thickening. Mediastinum/Nodes: Thyroid appears unremarkable. There is no appreciable thoracic  adenopathy. No esophageal lesions are evident. Lungs/Pleura: There is airspace opacity with consolidation throughout much of the anterior segment of the right upper lobe. More patchy infiltrate is noted in the apical segment of the right upper lobe. There is a right pleural effusion with consolidation in the right base, which in part represents compressive atelectasis. There is mild consolidation in the anterior and lateral segments of the left lower lobe  with posterior left base atelectasis. Upper Abdomen: Visualized upper abdominal structures appear unremarkable. Musculoskeletal: No blastic or lytic bone lesions. No chest wall lesions evident. Review of the MIP images confirms the above findings. IMPRESSION: 1. No appreciable pulmonary embolus. No thoracic aortic aneurysm or dissection. 2. Multifocal pneumonia. Consolidation is greatest in the anterior segment right upper lobe. There is also consolidation in portions of the anterior and lateral segments of the left lower lobe. More ill-defined infiltrate noted in the apical segment right upper lobe. 3. Right pleural effusion with compressive atelectasis in the right base. There may be a degree of superimposed pneumonia in this area as well. 4.  No evident adenopathy. Electronically Signed   By: Lowella Grip III M.D.   On: 08/29/2019 13:10      LOS: 19 days   Time spent: More than 50% of that time was spent in counseling and/or coordination of care.  Antonieta Pert, MD [facility]  08/30/2019, 8:05 AM

## 2019-08-31 LAB — BASIC METABOLIC PANEL
Anion gap: 10 (ref 5–15)
BUN: 6 mg/dL (ref 6–20)
CO2: 25 mmol/L (ref 22–32)
Calcium: 8.7 mg/dL — ABNORMAL LOW (ref 8.9–10.3)
Chloride: 102 mmol/L (ref 98–111)
Creatinine, Ser: 0.73 mg/dL (ref 0.61–1.24)
GFR calc Af Amer: >60 mL/min (ref >60)
GFR calc non Af Amer: >60 mL/min (ref >60)
Glucose, Bld: 132 mg/dL — ABNORMAL HIGH (ref 70–99)
Potassium: 3.4 mmol/L — ABNORMAL LOW (ref 3.5–5.1)
Sodium: 137 mmol/L (ref 135–145)

## 2019-08-31 LAB — CBC
HCT: 29.7 % — ABNORMAL LOW (ref 39.0–52.0)
Hemoglobin: 9.4 g/dL — ABNORMAL LOW (ref 13.0–17.0)
MCH: 26.9 pg (ref 26.0–34.0)
MCHC: 31.6 g/dL (ref 30.0–36.0)
MCV: 85.1 fL (ref 80.0–100.0)
Platelets: 254 10*3/uL (ref 150–400)
RBC: 3.49 MIL/uL — ABNORMAL LOW (ref 4.22–5.81)
RDW: 14.4 % (ref 11.5–15.5)
WBC: 7.9 10*3/uL (ref 4.0–10.5)
nRBC: 0 % (ref 0.0–0.2)

## 2019-08-31 MED ORDER — POTASSIUM CHLORIDE CRYS ER 20 MEQ PO TBCR
20.0000 meq | EXTENDED_RELEASE_TABLET | Freq: Once | ORAL | Status: AC
  Administered 2019-08-31: 20 meq via ORAL
  Filled 2019-08-31: qty 1

## 2019-08-31 NOTE — Progress Notes (Signed)
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  In the ER found to be septic with lactic acidosis, leukocytosis and fever and tachycardia and was admitted.  Blood culture grew MSSA and echo showed endocarditis right-sided, seen by infectious disease and has been on antibiotic with plan to continue Ancef from 1/14-1/28-then consider long-acting infusion.  Patient had fall 1/22 without LOC.  He has been having recurrent fever, ID was notified and repeat blood culture ordered 1/23 -Neg so far.  Patient also had pleuritic chest pain troponin negative cannot rule out PE on VQ scan and TTE and placed on Eliquis 1/25 but had episode of vomiting frank blood on 1/25 and GI was consulted.  Patient has been noncompliant with medical management at times refusing blood test imaging and treatments. Patient agreed for CTA chest 1/27-that showed patchy bilateral pneumonia, no PE.   Subjective:  Patient feels much improved no more fever chest pain is less.  Ambulating in the room on room air.  Had a bowel movement after laxatives.  Tolerating diet.    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  Patient had recurrent fever spikes likely from pulmonary septic emboli/pneumonia- CTA done and repeat blood culture 1/27 amd ID reconsulted.  No more fever spike now.  Cont Ancef till 09/10/19- then outpatient oritavancin/dalbavancin for long acting medication vs linezolid to complete 6 wk.  CTA chest 1/27  "Multifocal pneumonia. Consolidation is greatest in the anterior segment right upper lobe. There is also consolidation  in portions of the anterior and lateral segments of the left lower lobe. More ill-defined infiltrate noted in the apical segment right upper lobe.Right pleural effusion with compressive atelectasis in the right base. There may be a degree of superimposed pneumonia in this area as well"   Pleuritic chest pain: No PE.  VQ scan was indeterminate and given this presentation patient was empirically placed on anticoagulation with Eliquis for several days  and has been discontinued 1/27 after negative CTA chest CT for PE.  Episode of frank blood vomiting 1/25/?  Upper GI bleed seen by GI patient  did not want endoscopy, on Protonix PPI twice daily hemoglobin overall stable monitor. Off Eliquis as there is no PE on the CT scan.  Continue soft diet.  Anemia suspecting due to chronic disease, some component Acute blood loss anemia. Hb stable. Recent Labs  Lab 08/28/19 0025 08/28/19 0025 08/28/19 0712 08/28/19 1122 08/29/19 0434 08/29/19 1913 08/31/19 0506  HGB 10.3*  --  11.0*  --  10.4* 10.4* 9.4*  HCT 32.8*   < > 35.2* 32.8* 33.4* 33.6* 29.7*   < > = values in this interval not displayed.   Constipation; improved with mag citrate.  Thrombocytopenia: Resolved  IV drug abuse, has been using heroin recently no evidence of withdrawal, patient did not want any treatment  Schizophrenia/bipolar disorder on Seroquel and Haldol at night. Stable modd.  Fall in the hospital, possibly orthostatic?Marland Kitchen  PT was consulted and patient declined.  Medical noncompliance refusing testing care plan  Mild hypokalemia will replete.  Body mass index is 41.69 kg/m.   DVT prophylaxis: eliquis-> Lovenox Code Status: Full  code Family Communication: plan of care discussed with patient at bedside. Dr Velvet Bathe his mother Ms. Tamu [phone] and left a voicemail message while in the patient's room 1/26.  Patient aware about ongoing plan.  No family at bedside. Disposition Plan: Remains inpatient pending completion  of iv antibiotic TILL February 8,-Unable to discharge with PICC line given IV drug abuse.  Consultants: ID, psychiatry, [facility] Procedures:see note  CTA 1."No appreciable pulmonary embolus. No thoracic aortic aneurysm or dissection.  2. Multifocal pneumonia. Consolidation is greatest in the anterior segment right upper lobe. There is also consolidation in portions of the anterior and lateral segments of the left lower lobe. More ill-defined infiltrate noted in the apical segment right upper lobe.  3. Right pleural effusion with compressive atelectasis in the right base. There may be a degree of superimposed pneumonia in this area as well.  4.  No evident adenopathy"   VQ scan 1/24  IMPRESSION: Wedge-shaped perfusion defect in the anterior right upper lobe corresponding to airspace disease seen on chest x-ray. Without ventilation, this is indeterminate for pulmonary embolus. Recommend CTA chest once adequate IV access is obtained.  Microbiology: Antimicrobials: Anti-infectives (From admission, onward)   Start     Dose/Rate Route Frequency Ordered Stop   08/12/19 1800  ceFAZolin (ANCEF) IVPB 2g/100 mL premix     2 g 200 mL/hr over 30 Minutes Intravenous Every 8 hours 08/12/19 1251     08/11/19 1400  metroNIDAZOLE (FLAGYL) IVPB 500 mg  Status:  Discontinued     500 mg 100 mL/hr over 60 Minutes Intravenous Every 8 hours 08/11/19 0556 08/12/19 1229   08/11/19 1000  ceFEPIme (MAXIPIME) 2 g in sodium chloride 0.9 % 100 mL IVPB  Status:  Discontinued     2 g 200 mL/hr over 30 Minutes Intravenous Every 8 hours 08/11/19 0330 08/12/19 1229   08/11/19 0600  vancomycin (VANCOREADY) IVPB 1750 mg/350 mL  Status:  Discontinued     1,750 mg 175 mL/hr over 120 Minutes Intravenous Every 12 hours 08/11/19 0330 08/11/19 0335   08/11/19 0600  vancomycin (VANCOREADY) IVPB 1250 mg/250 mL  Status:  Discontinued     1,250 mg 166.7 mL/hr over 90 Minutes Intravenous Every 12 hours 08/11/19  0335 08/13/19 0928   08/11/19 0200  ceFEPIme (MAXIPIME) 2 g in sodium chloride 0.9 % 100 mL IVPB     2 g 200 mL/hr over 30 Minutes Intravenous  Once 08/11/19 0149 08/11/19 0257   08/11/19 0145  metroNIDAZOLE (FLAGYL) IVPB 500 mg     500 mg 100 mL/hr over 60 Minutes Intravenous  Once 08/11/19 0135 08/11/19 0623   08/11/19 0145  vancomycin (VANCOCIN) IVPB 1000 mg/200 mL premix     1,000 mg 200 mL/hr over 60 Minutes Intravenous  Once 08/11/19 0142 08/11/19 0509      Medications: Scheduled Meds:  enoxaparin (LOVENOX) injection  40 mg Subcutaneous Q24H   feeding supplement (ENSURE ENLIVE)  237 mL Oral BID BM   haloperidol  2 mg Oral QHS   mouth rinse  15 mL Mouth Rinse BID   pantoprazole  40 mg Oral BID   polyethylene glycol  17 g Oral BID   QUEtiapine  300 mg Oral QHS   senna-docusate  2 tablet Oral BID   Continuous Infusions:  sodium chloride 250 mL (08/31/19 0301)    ceFAZolin (ANCEF) IV 2 g (08/31/19 1041)    Objective: Vitals:   08/30/19 1356 08/30/19 1457 08/30/19 2057 08/31/19 0545  BP: Marland Kitchen)  105/50  116/63 105/67  Pulse: (!) 102  (!) 107 91  Resp: 18  20 16   Temp: 98.6 F (37 C)  99.7 F (37.6 C) 98.3 F (36.8 C)  TempSrc: Oral   Oral  SpO2: 96% 96% 97% 96%  Weight:      Height:        Intake/Output Summary (Last 24 hours) at 08/31/2019 1048 Last data filed at 08/31/2019 0845 Gross per 24 hour  Intake 120 ml  Output --  Net 120 ml   Filed Weights   08/12/19 0000  Weight: 103.4 kg   Weight change:   Body mass index is 41.69 kg/m.  Intake/Output from previous day: No intake/output data recorded. Intake/Output this shift: Total I/O In: 120 [P.O.:120] Out: -   Examination:  General exam: Alert oriented x3, on room air, obese, not in distress.  HEENT:Oral mucosa moist, Ear/Nose WNL grossly, dentition normal. Respiratory system: Mild crackles in the right lung, no use of accessory muscles,  Cardiovascular system: S1 & S2 +, No  JVD,. Gastrointestinal system: Abdomen soft, NT,ND, BS+ Nervous System:Alert, awake, moving extremities and grossly nonfocal Extremities: No edema, distal peripheral pulses palpable.  Skin: No rashes,no icterus. MSK: Normal muscle bulk,tone, power   Data Reviewed: I have personally reviewed following labs and imaging studies  CBC: Recent Labs  Lab 08/25/19 0536 08/25/19 0536 08/26/19 0538 08/26/19 0538 08/28/19 0025 08/28/19 0025 08/28/19 0712 08/28/19 1122 08/29/19 0434 08/29/19 1913 08/31/19 0506  WBC 13.2*   < > 10.8*   < > 9.2  --  9.8  --  9.2 7.9 7.9  NEUTROABS 9.8*  --  7.9*  --   --   --   --   --   --  5.6  --   HGB 11.8*   < > 10.7*   < > 10.3*  --  11.0*  --  10.4* 10.4* 9.4*  HCT 38.7*   < > 34.2*   < > 32.8*   < > 35.2* 32.8* 33.4* 33.6* 29.7*  MCV 88.8   < > 87.2   < > 85.0  --  86.9  --  86.3 87.7 85.1  PLT 371   < > 318   < > 330  --  300  --  306 276 254   < > = values in this interval not displayed.   Basic Metabolic Panel: Recent Labs  Lab 08/25/19 0536 08/28/19 0712 08/29/19 0434 08/31/19 0506  NA 137 137 138 137  K 3.8 4.0 3.6 3.4*  CL 101 103 102 102  CO2 27 23 25 25   GLUCOSE 120* 90 102* 132*  BUN 7 6 5* 6  CREATININE 0.85 0.60* 0.73 0.73  CALCIUM 9.2 8.6* 8.8* 8.7*  MG 1.9  --   --   --    GFR: Estimated Creatinine Clearance: 147.9 mL/min (by C-G formula based on SCr of 0.73 mg/dL). Liver Function Tests: Recent Labs  Lab 08/25/19 0536 08/29/19 0434  AST 20 13*  ALT 25 23  ALKPHOS 92 93  BILITOT 0.9 1.3*  PROT 7.7 7.3  ALBUMIN 3.3* 2.8*   Recent Labs  Lab 08/25/19 0536  LIPASE 23   No results for input(s): AMMONIA in the last 168 hours. Coagulation Profile: No results for input(s): INR, PROTIME in the last 168 hours. Cardiac Enzymes: No results for input(s): CKTOTAL, CKMB, CKMBINDEX, TROPONINI in the last 168 hours. BNP (last 3 results) No results for input(s): PROBNP in the last 8760 hours.  HbA1C: No results for  input(s): HGBA1C in the last 72 hours. CBG: No results for input(s): GLUCAP in the last 168 hours. Lipid Profile: No results for input(s): CHOL, HDL, LDLCALC, TRIG, CHOLHDL, LDLDIRECT in the last 72 hours. Thyroid Function Tests: No results for input(s): TSH, T4TOTAL, FREET4, T3FREE, THYROIDAB in the last 72 hours. Anemia Panel: No results for input(s): VITAMINB12, FOLATE, FERRITIN, TIBC, IRON, RETICCTPCT in the last 72 hours. Sepsis Labs: Recent Labs  Lab 08/25/19 0536 08/25/19 0545 08/28/19 0302  PROCALCITON <0.10  --   --   LATICACIDVEN  --  1.3 0.7    Recent Results (from the past 240 hour(s))  Culture, blood (routine x 2)     Status: None   Collection Time: 08/25/19  5:36 AM   Specimen: BLOOD  Result Value Ref Range Status   Specimen Description   Final    BLOOD LEFT ANTECUBITAL Performed at [facility], [address] [place], [place] [address]    Special Requests   Final    BOTTLES DRAWN AEROBIC AND ANAEROBIC Blood Culture adequate volume Performed at [facility], [address] [place], [place] [address]    Culture   Final    NO GROWTH 5 DAYS Performed at [facility], [place] [address], [place], [place] [address]    Report Status 08/30/2019 FINAL  Final  Culture, blood (routine x 2)     Status: None   Collection Time: 08/25/19  5:46 AM   Specimen: BLOOD  Result Value Ref Range Status   Specimen Description   Final    BLOOD LEFT ARM Performed at [facility], [address] [place], [place] [address]    Special Requests   Final    BOTTLES DRAWN AEROBIC AND ANAEROBIC Blood Culture results may not be optimal due to an excessive volume of blood received in culture bottles Performed at [facility], [address] [place], [place] [address]    Culture   Final    NO GROWTH 5 DAYS Performed at [facility], [place] [address] [place], [place] [address]    Report Status 08/30/2019 FINAL  Final  Culture,  blood (routine x 2)     Status: None (Preliminary result)   Collection Time: 08/29/19  7:13 PM   Specimen: BLOOD  Result Value Ref Range Status   Specimen Description   Final    BLOOD LEFT ANTECUBITAL Performed at [place] [address] [place], [place] [address]    Special Requests   Final    BOTTLES DRAWN AEROBIC AND ANAEROBIC Blood Culture adequate volume Performed at [place] [address] [place], [place] [address]    Culture   Final    NO GROWTH 2 DAYS Performed at [place] [address] [place], [place] [address]    Report Status PENDING  Incomplete  Culture, blood (routine x 2)     Status: None (Preliminary result)   Collection Time: 08/29/19  7:18 PM   Specimen: BLOOD RIGHT HAND  Result Value Ref Range Status   Specimen Description   Final    BLOOD RIGHT HAND Performed at [facility] [address] [place], [place] [address]    Special Requests   Final    BOTTLES DRAWN AEROBIC ONLY Blood Culture adequate volume Performed at [facility] [address] [place], [place] [address]  Culture   Final    NO GROWTH 2 DAYS Performed at [facility], [place] [address], [place], [place] [address]    Report Status PENDING  Incomplete      Radiology Studies: CT ANGIO CHEST PE W OR WO CONTRAST  Result Date: 08/29/2019 CLINICAL DATA:  Shortness of breath and chest pain EXAM: CT ANGIOGRAPHY CHEST WITH CONTRAST TECHNIQUE: Multidetector CT imaging of the chest was performed using the standard protocol during bolus administration of intravenous contrast. Multiplanar CT image reconstructions and MIPs were obtained to evaluate the vascular anatomy. CONTRAST:  170mL OMNIPAQUE IOHEXOL 350 MG/ML SOLN COMPARISON:  CT angiogram chest August 11, 2019; perfusion lung scan August 26, 2019 FINDINGS: Cardiovascular: There is no appreciable pulmonary embolus. There is no thoracic aortic  aneurysm or dissection. The visualized great vessels appear unremarkable. There is no appreciable pericardial effusion or pericardial thickening. Mediastinum/Nodes: Thyroid appears unremarkable. There is no appreciable thoracic adenopathy. No esophageal lesions are evident. Lungs/Pleura: There is airspace opacity with consolidation throughout much of the anterior segment of the right upper lobe. More patchy infiltrate is noted in the apical segment of the right upper lobe. There is a right pleural effusion with consolidation in the right base, which in part represents compressive atelectasis. There is mild consolidation in the anterior and lateral segments of the left lower lobe with posterior left base atelectasis. Upper Abdomen: Visualized upper abdominal structures appear unremarkable. Musculoskeletal: No blastic or lytic bone lesions. No chest wall lesions evident. Review of the MIP images confirms the above findings. IMPRESSION: 1. No appreciable pulmonary embolus. No thoracic aortic aneurysm or dissection. 2. Multifocal pneumonia. Consolidation is greatest in the anterior segment right upper lobe. There is also consolidation in portions of the anterior and lateral segments of the left lower lobe. More ill-defined infiltrate noted in the apical segment right upper lobe. 3. Right pleural effusion with compressive atelectasis in the right base. There may be a degree of superimposed pneumonia in this area as well. 4.  No evident adenopathy. Electronically Signed   By: Lowella Grip III M.D.   On: 08/29/2019 13:10      LOS: 20 days   Time spent: More than 50% of that time was spent in counseling and/or coordination of care.  Antonieta Pert, MD [facility]  08/31/2019, 10:48 AM

## 2019-08-31 NOTE — Progress Notes (Signed)
ID PROGRESS NOTE   24yo M with MSSA TV endocarditis with septic pulmonary emboli with pneumonia. No longer febrile. Continue to treat with iv cefazolin 2gm IV Q 8 hr- plan through 2/8th then finish course of with oritavancin/vs oral abtx. Appears to be okay to stay for 4 wk iv course but not 6wk. Repeat blood cx from 1/27 when he was febrile are still no growth to date.  Continue course of iv abtx with cefazolin 2gm iv q8hr.  Justin Chaney [facility] [phone]

## 2019-09-01 NOTE — Progress Notes (Signed)
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  In the ER found to be septic with lactic acidosis, leukocytosis and fever and tachycardia and was admitted.  Blood culture grew MSSA and echo showed endocarditis right-sided, seen by infectious disease and has been on antibiotic with plan to continue Ancef from 1/14-1/28-then consider long-acting infusion.  Patient had fall 1/22 without LOC.  He has been having recurrent fever, ID was notified and repeat blood culture ordered 1/23 -Neg so far.  Patient also had pleuritic chest pain troponin negative cannot rule out PE on VQ scan and TTE and placed on Eliquis 1/25 but had episode of vomiting frank blood on 1/25 and GI was consulted.  Patient has been noncompliant with medical management at times refusing blood test imaging and treatments. Patient agreed for CTA chest 1/27-that showed patchy bilateral pneumonia, no PE.   Subjective: Seen this morning.  Resting comfortably.  Denies new complaint.  Doing overall well.  On room air.  Has been ambulating in the hallway this morning No recurrence of fever since last spike on 1/27-1 blood cultures were ordered and are no growth so far.  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 As per ID cont Ancef till 09/10/19- then outpatient oritavancin/dalbavancin for long acting medication vs linezolid to complete 6 wk.  CTA chest 1/27  "Multifocal pneumonia. Consolidation is  greatest in the anterior segment right upper lobe. There is also consolidation in portions of the anterior and lateral segments of the left lower lobe. More ill-defined infiltrate noted in the apical segment right upper lobe.Right pleural effusion with compressive atelectasis in the right base. There may be a degree of superimposed pneumonia in this area as well"   Pleuritic chest pain: No PE.  VQ scan was indeterminate and given this presentation patient was empirically placed on anticoagulation with Eliquis for several days  and has been discontinued 1/27 after negative CTA chest CT for PE.  Episode of frank blood vomiting 1/25/?  Upper GI bleed seen by GI patient  did not want endoscopy, on Protonix PPI twice daily hemoglobin overall stable monitor. Off Eliquis as there is no PE on the CT scan.  Continue soft diet.  Anemia suspecting due to chronic disease, some component Acute blood loss anemia. Hb stable. Recent Labs  Lab 08/28/19 0025 08/28/19 0025 08/28/19 0712 08/28/19 1122 08/29/19 0434 08/29/19 1913 08/31/19 0506  HGB 10.3*  --  11.0*  --  10.4* 10.4* 9.4*  HCT 32.8*   < > 35.2* 32.8* 33.4* 33.6* 29.7*   < > = values in this interval not displayed.   Constipation; improved with mag citrate.  Thrombocytopenia: Resolved  IV drug abuse, has been using heroin recently no evidence of withdrawal, patient did not want any treatment  Schizophrenia/bipolar disorder on Seroquel and Haldol at night. Stable modd.  Fall in the hospital, possibly orthostatic?Marland Kitchen  PT was consulted and patient declined.  Medical noncompliance refusing testing care plan  Mild hypokalemia will replete.  Body mass index  is 41.69 kg/m.   DVT prophylaxis: eliquis-> Lovenox Code Status: Full code Family Communication: plan of care discussed with patient at bedside. Dr Velvet Bathe his mother Ms. Tamu [phone] and left a voicemail message while in the patient's room 1/26.  Patient aware about ongoing plan.   No family at bedside. Disposition Plan: Remains inpatient pending completion of iv antibiotic until February 8,-Unable to discharge with PICC line given IV drug abuse.  Consultants: ID, psychiatry, [facility] Procedures:see note  CTA 1."No appreciable pulmonary embolus. No thoracic aortic aneurysm or dissection.  2. Multifocal pneumonia. Consolidation is greatest in the anterior segment right upper lobe. There is also consolidation in portions of the anterior and lateral segments of the left lower lobe. More ill-defined infiltrate noted in the apical segment right upper lobe.  3. Right pleural effusion with compressive atelectasis in the right base. There may be a degree of superimposed pneumonia in this area as well.  4.  No evident adenopathy"   VQ scan 1/24  IMPRESSION: Wedge-shaped perfusion defect in the anterior right upper lobe corresponding to airspace disease seen on chest x-ray. Without ventilation, this is indeterminate for pulmonary embolus. Recommend CTA chest once adequate IV access is obtained.  Microbiology: Antimicrobials: Anti-infectives (From admission, onward)   Start     Dose/Rate Route Frequency Ordered Stop   08/12/19 1800  ceFAZolin (ANCEF) IVPB 2g/100 mL premix     2 g 200 mL/hr over 30 Minutes Intravenous Every 8 hours 08/12/19 1251     08/11/19 1400  metroNIDAZOLE (FLAGYL) IVPB 500 mg  Status:  Discontinued     500 mg 100 mL/hr over 60 Minutes Intravenous Every 8 hours 08/11/19 0556 08/12/19 1229   08/11/19 1000  ceFEPIme (MAXIPIME) 2 g in sodium chloride 0.9 % 100 mL IVPB  Status:  Discontinued     2 g 200 mL/hr over 30 Minutes Intravenous Every 8 hours 08/11/19 0330 08/12/19 1229   08/11/19 0600  vancomycin (VANCOREADY) IVPB 1750 mg/350 mL  Status:  Discontinued     1,750 mg 175 mL/hr over 120 Minutes Intravenous Every 12 hours 08/11/19 0330 08/11/19 0335   08/11/19 0600  vancomycin (VANCOREADY) IVPB 1250 mg/250 mL  Status:  Discontinued       1,250 mg 166.7 mL/hr over 90 Minutes Intravenous Every 12 hours 08/11/19 0335 08/13/19 0928   08/11/19 0200  ceFEPIme (MAXIPIME) 2 g in sodium chloride 0.9 % 100 mL IVPB     2 g 200 mL/hr over 30 Minutes Intravenous  Once 08/11/19 0149 08/11/19 0257   08/11/19 0145  metroNIDAZOLE (FLAGYL) IVPB 500 mg     500 mg 100 mL/hr over 60 Minutes Intravenous  Once 08/11/19 0135 08/11/19 0623   08/11/19 0145  vancomycin (VANCOCIN) IVPB 1000 mg/200 mL premix     1,000 mg 200 mL/hr over 60 Minutes Intravenous  Once 08/11/19 0142 08/11/19 0509      Medications: Scheduled Meds: . enoxaparin (LOVENOX) injection  40 mg Subcutaneous Q24H  . feeding supplement (ENSURE ENLIVE)  237 mL Oral BID BM  . haloperidol  2 mg Oral QHS  . mouth rinse  15 mL Mouth Rinse BID  . pantoprazole  40 mg Oral BID  . polyethylene glycol  17 g Oral BID  . QUEtiapine  300 mg Oral QHS  . senna-docusate  2 tablet Oral BID   Continuous Infusions: . sodium chloride 250 mL (08/31/19 0301)  .  ceFAZolin (ANCEF) IV 2 g (09/01/19 0206)    Objective: Vitals:  08/31/19 0545 08/31/19 1351 08/31/19 2013 09/01/19 0451  BP: 105/67 121/73 127/77 129/73  Pulse: 91 97 97 (!) 101  Resp: 16 18 (!) 24 20  Temp: 98.3 F (36.8 C) 98.9 F (37.2 C) 99.1 F (37.3 C) 98 F (36.7 C)  TempSrc: Oral Oral Oral Oral  SpO2: 96% 98% 100% 97%  Weight:      Height:        Intake/Output Summary (Last 24 hours) at 09/01/2019 0801 Last data filed at 08/31/2019 1830 Gross per 24 hour  Intake 600 ml  Output --  Net 600 ml   Filed Weights   08/12/19 0000  Weight: 103.4 kg   Weight change:   Body mass index is 41.69 kg/m.  Intake/Output from previous day: 01/29 0701 - 01/30 0700 In: 600 [P.O.:600] Out: -  Intake/Output this shift: No intake/output data recorded.  Examination: General exam: Calm, comfortable, not in acute distress obese. HEENT:Oral mucosa moist, Ear/Nose WNL grossly, dentition normal. Respiratory system:  Bilateral equal air entry, no crackles and wheezing, no use of accessory muscle, Cardiovascular system: regular rate and rhythm, S1 & S2 heard, No JVD/murmurs. Gastrointestinal system: Abdomen soft, non-tender, non-distended, BS +. No hepatosplenomegaly palpable. Nervous System:Alert, awake and oriented at baseline. Able to move UE and LE, sensation intact. Extremities: No edema, distal peripheral pulses palpable.  Skin: No rashes,no icterus. MSK: Normal muscle bulk,tone, power Data Reviewed: I have personally reviewed following labs and imaging studies  CBC: Recent Labs  Lab 08/26/19 0538 08/26/19 0538 08/28/19 0025 08/28/19 0025 08/28/19 0712 08/28/19 1122 08/29/19 0434 08/29/19 1913 08/31/19 0506  WBC 10.8*   < > 9.2  --  9.8  --  9.2 7.9 7.9  NEUTROABS 7.9*  --   --   --   --   --   --  5.6  --   HGB 10.7*   < > 10.3*  --  11.0*  --  10.4* 10.4* 9.4*  HCT 34.2*   < > 32.8*   < > 35.2* 32.8* 33.4* 33.6* 29.7*  MCV 87.2   < > 85.0  --  86.9  --  86.3 87.7 85.1  PLT 318   < > 330  --  300  --  306 276 254   < > = values in this interval not displayed.   Basic Metabolic Panel: Recent Labs  Lab 08/28/19 0712 08/29/19 0434 08/31/19 0506  NA 137 138 137  K 4.0 3.6 3.4*  CL 103 102 102  CO2 23 25 25   GLUCOSE 90 102* 132*  BUN 6 5* 6  CREATININE 0.60* 0.73 0.73  CALCIUM 8.6* 8.8* 8.7*   GFR: Estimated Creatinine Clearance: 147.9 mL/min (by C-G formula based on SCr of 0.73 mg/dL). Liver Function Tests: Recent Labs  Lab 08/29/19 0434  AST 13*  ALT 23  ALKPHOS 93  BILITOT 1.3*  PROT 7.3  ALBUMIN 2.8*   No results for input(s): LIPASE, AMYLASE in the last 168 hours. No results for input(s): AMMONIA in the last 168 hours. Coagulation Profile: No results for input(s): INR, PROTIME in the last 168 hours. Cardiac Enzymes: No results for input(s): CKTOTAL, CKMB, CKMBINDEX, TROPONINI in the last 168 hours. BNP (last 3 results) No results for input(s): PROBNP in the  last 8760 hours. HbA1C: No results for input(s): HGBA1C in the last 72 hours. CBG: No results for input(s): GLUCAP in the last 168 hours. Lipid Profile: No results for input(s): CHOL, HDL, LDLCALC, TRIG, CHOLHDL, LDLDIRECT in the  last 72 hours. Thyroid Function Tests: No results for input(s): TSH, T4TOTAL, FREET4, T3FREE, THYROIDAB in the last 72 hours. Anemia Panel: No results for input(s): VITAMINB12, FOLATE, FERRITIN, TIBC, IRON, RETICCTPCT in the last 72 hours. Sepsis Labs: Recent Labs  Lab 08/28/19 0302  LATICACIDVEN 0.7    Recent Results (from the past 240 hour(s))  Culture, blood (routine x 2)     Status: None   Collection Time: 08/25/19  5:36 AM   Specimen: BLOOD  Result Value Ref Range Status   Specimen Description   Final    BLOOD LEFT ANTECUBITAL Performed at [facility], [address] [place], [place] [address]    Special Requests   Final    BOTTLES DRAWN AEROBIC AND ANAEROBIC Blood Culture adequate volume Performed at [facility], [address] [place], [place] [address]    Culture   Final    NO GROWTH 5 DAYS Performed at [facility], [place] [address] [place], [place] [address]    Report Status 08/30/2019 FINAL  Final  Culture, blood (routine x 2)     Status: None   Collection Time: 08/25/19  5:46 AM   Specimen: BLOOD  Result Value Ref Range Status   Specimen Description   Final    BLOOD LEFT ARM Performed at [facility], [address] [place], [place] [address]    Special Requests   Final    BOTTLES DRAWN AEROBIC AND ANAEROBIC Blood Culture results may not be optimal due to an excessive volume of blood received in culture bottles Performed at [facility], [address] [place], [place] [address]    Culture   Final    NO GROWTH 5 DAYS Performed at [facility], [place] [address], [place], [place] [address]    Report Status 08/30/2019 FINAL  Final  Culture, blood (routine x 2)     Status:  None (Preliminary result)   Collection Time: 08/29/19  7:13 PM   Specimen: BLOOD  Result Value Ref Range Status   Specimen Description   Final    BLOOD LEFT ANTECUBITAL Performed at [facility] [address], [place], [place] [address]    Special Requests   Final    BOTTLES DRAWN AEROBIC AND ANAEROBIC Blood Culture adequate volume Performed at [facility] [address], [place], [place] [address]    Culture   Final    NO GROWTH 2 DAYS Performed at [facility] [address], [place], [place] [address]    Report Status PENDING  Incomplete  Culture, blood (routine x 2)     Status: None (Preliminary result)   Collection Time: 08/29/19  7:18 PM   Specimen: BLOOD RIGHT HAND  Result Value Ref Range Status   Specimen Description   Final    BLOOD RIGHT HAND Performed at [facility] [address], [place], [name] [address]    Special Requests   Final    BOTTLES DRAWN AEROBIC ONLY Blood Culture adequate volume Performed at [facility] [address], [place], [place] [address]    Culture   Final    NO GROWTH 2 DAYS Performed at [facility] [address] [place], [place] [address]    Report Status PENDING  Incomplete      Radiology Studies: No results found.    LOS: 21 days   Time spent:  More than 50% of that time was spent in counseling and/or coordination of care.  Antonieta Pert, MD [facility]  09/01/2019, 8:01 AM

## 2019-09-02 LAB — CBC
HCT: 32.7 % — ABNORMAL LOW (ref 39.0–52.0)
Hemoglobin: 10.1 g/dL — ABNORMAL LOW (ref 13.0–17.0)
MCH: 26.9 pg (ref 26.0–34.0)
MCHC: 30.9 g/dL (ref 30.0–36.0)
MCV: 87 fL (ref 80.0–100.0)
Platelets: 353 10*3/uL (ref 150–400)
RBC: 3.76 MIL/uL — ABNORMAL LOW (ref 4.22–5.81)
RDW: 14.4 % (ref 11.5–15.5)
WBC: 7.1 10*3/uL (ref 4.0–10.5)
nRBC: 0 % (ref 0.0–0.2)

## 2019-09-02 LAB — BASIC METABOLIC PANEL
Anion gap: 9 (ref 5–15)
BUN: 8 mg/dL (ref 6–20)
CO2: 24 mmol/L (ref 22–32)
Calcium: 8.7 mg/dL — ABNORMAL LOW (ref 8.9–10.3)
Chloride: 105 mmol/L (ref 98–111)
Creatinine, Ser: 0.59 mg/dL — ABNORMAL LOW (ref 0.61–1.24)
GFR calc Af Amer: >60 mL/min (ref >60)
GFR calc non Af Amer: >60 mL/min (ref >60)
Glucose, Bld: 101 mg/dL — ABNORMAL HIGH (ref 70–99)
Potassium: 4 mmol/L (ref 3.5–5.1)
Sodium: 138 mmol/L (ref 135–145)

## 2019-09-02 NOTE — Progress Notes (Signed)
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  In the ER found to be septic with lactic acidosis, leukocytosis and fever and tachycardia and was admitted.  Blood culture grew MSSA and echo showed endocarditis right-sided, seen by infectious disease and has been on antibiotic with plan to continue Ancef from 1/14-1/28-then consider long-acting infusion.  Patient had fall 1/22 without LOC.  He has been having recurrent fever, ID was notified and repeat blood culture ordered 1/23 -Neg so far.  Patient also had pleuritic chest pain troponin negative cannot rule out PE on VQ scan and TTE and placed on Eliquis 1/25 but had episode of vomiting frank blood on 1/25 and GI was consulted.  Patient has been noncompliant with medical management at times refusing blood test imaging and treatments. Patient agreed for CTA chest 1/27-that showed patchy bilateral pneumonia, no PE-and his Eliquis was discontinued.  Blood cultures were ordered and ID was reconsulted.  Subsequently patient has become fever free chest pain significantly improved.  Subjective: Seen this morning.  Ambulating in the room, wants to discontinue telemetry and have regular diet. Denies nausea vomiting, chest pain improved. No events of fever, remains hemodynamically stable, saturating well on room air.  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 As per ID cont Ancef till 09/10/19- then outpatient  oritavancin/dalbavancin for long acting medication vs linezolid to complete 6 wk-we will need to discuss with ID around 09/09/19.  CTA chest 1/27  "Multifocal pneumonia. Consolidation is greatest in the anterior segment right upper lobe. There is also consolidation in portions of the anterior and lateral segments of the left lower lobe. More ill-defined infiltrate noted in the apical segment right upper lobe.Right pleural effusion with compressive atelectasis in the right base. There may be a degree of superimposed pneumonia in this area as well"   Pleuritic chest pain: Improved.  No PE.  VQ scan was indeterminate and given this presentation patient was empirically placed on anticoagulation with Eliquis for several days  and has been discontinued 1/27 after negative CTA chest CT for PE.  Episode of frank blood vomiting 1/25/?  Upper GI bleed seen by GI patient  did not want endoscopy, on Protonix PPI twice daily hemoglobin overall stable monitor. Off Eliquis as there is no PE on the CT scan.  Advance to regular diet.    Anemia suspecting due to chronic disease, some component Acute blood loss anemia. Hb stable. Recent Labs  Lab 08/28/19 0712 08/28/19 1122 08/29/19 0434 08/29/19 1913 08/31/19 0506 09/02/19 0546  HGB 11.0*  --  10.4* 10.4* 9.4* 10.1*  HCT 35.2* 32.8* 33.4* 33.6* 29.7* 32.7*   Constipation; improved with mag citrate.  Thrombocytopenia: Resolved  IV drug abuse: Cessation advised.  No evidence of withdrawal.  Monitor.  Schizophrenia/bipolar disorder on Seroquel and Haldol at night. Stable mood.  Fall in the hospital, possibly orthostatic?Marland Kitchen  PT was consulted and patient declined.  He has been ambulating in the room in the hallway without any issues since then.  Medical  noncompliance refusing testing care plan deviously and he seems to be more compliant now and agreeable to stay till feb 8 for 4 wks of iv antibiotics.  Mild hypokalemia will replete.  Body mass index is 41.69  kg/m.   DVT prophylaxis: eliquis-> Lovenox Code Status: Full code Family Communication: plan of care discussed with patient at bedside. Dr Velvet Bathe his mother Ms. Tamu [phone] and left a voicemail message while in the patient's room 1/26.  Patient aware about ongoing plan.  No family at bedside. Disposition Plan: Remains inpatient pending completion of iv antibiotic until February 8,-Unable to discharge with PICC line given IV drug abuse.  Consultants: ID, psychiatry, [facility] Procedures:see note  CTA 1."No appreciable pulmonary embolus. No thoracic aortic aneurysm or dissection.  2. Multifocal pneumonia. Consolidation is greatest in the anterior segment right upper lobe. There is also consolidation in portions of the anterior and lateral segments of the left lower lobe. More ill-defined infiltrate noted in the apical segment right upper lobe.  3. Right pleural effusion with compressive atelectasis in the right base. There may be a degree of superimposed pneumonia in this area as well.  4.  No evident adenopathy"   VQ scan 1/24  IMPRESSION: Wedge-shaped perfusion defect in the anterior right upper lobe corresponding to airspace disease seen on chest x-ray. Without ventilation, this is indeterminate for pulmonary embolus. Recommend CTA chest once adequate IV access is obtained.  Microbiology: Antimicrobials: Anti-infectives (From admission, onward)   Start     Dose/Rate Route Frequency Ordered Stop   08/12/19 1800  ceFAZolin (ANCEF) IVPB 2g/100 mL premix     2 g 200 mL/hr over 30 Minutes Intravenous Every 8 hours 08/12/19 1251     08/11/19 1400  metroNIDAZOLE (FLAGYL) IVPB 500 mg  Status:  Discontinued     500 mg 100 mL/hr over 60 Minutes Intravenous Every 8 hours 08/11/19 0556 08/12/19 1229   08/11/19 1000  ceFEPIme (MAXIPIME) 2 g in sodium chloride 0.9 % 100 mL IVPB  Status:  Discontinued     2 g 200 mL/hr over 30 Minutes Intravenous Every 8 hours 08/11/19  0330 08/12/19 1229   08/11/19 0600  vancomycin (VANCOREADY) IVPB 1750 mg/350 mL  Status:  Discontinued     1,750 mg 175 mL/hr over 120 Minutes Intravenous Every 12 hours 08/11/19 0330 08/11/19 0335   08/11/19 0600  vancomycin (VANCOREADY) IVPB 1250 mg/250 mL  Status:  Discontinued     1,250 mg 166.7 mL/hr over 90 Minutes Intravenous Every 12 hours 08/11/19 0335 08/13/19 0928   08/11/19 0200  ceFEPIme (MAXIPIME) 2 g in sodium chloride 0.9 % 100 mL IVPB     2 g 200 mL/hr over 30 Minutes Intravenous  Once 08/11/19 0149 08/11/19 0257   08/11/19 0145  metroNIDAZOLE (FLAGYL) IVPB 500 mg     500 mg 100 mL/hr over 60 Minutes Intravenous  Once 08/11/19 0135 08/11/19 0623   08/11/19 0145  vancomycin (VANCOCIN) IVPB 1000 mg/200 mL premix     1,000 mg 200 mL/hr over 60 Minutes Intravenous  Once 08/11/19 0142 08/11/19 0509      Medications: Scheduled Meds: . enoxaparin (LOVENOX) injection  40 mg Subcutaneous Q24H  . feeding supplement (ENSURE ENLIVE)  237 mL Oral BID BM  . haloperidol  2 mg Oral QHS  . mouth rinse  15 mL Mouth Rinse BID  . pantoprazole  40 mg Oral BID  . polyethylene glycol  17 g Oral BID  . QUEtiapine  300 mg Oral  QHS  . senna-docusate  2 tablet Oral BID   Continuous Infusions: . sodium chloride 250 mL (08/31/19 0301)  .  ceFAZolin (ANCEF) IV 2 g (09/02/19 0106)    Objective: Vitals:   09/01/19 0451 09/01/19 1209 09/01/19 2023 09/02/19 0556  BP: 129/73 138/71 113/62 (!) 120/57  Pulse: (!) 101 89 97 83  Resp: 20 20 18 18   Temp: 98 F (36.7 C) 98.9 F (37.2 C) 98.1 F (36.7 C) 98.5 F (36.9 C)  TempSrc: Oral Oral Oral Oral  SpO2: 97% 98% 100% 100%  Weight:      Height:        Intake/Output Summary (Last 24 hours) at 09/02/2019 0945 Last data filed at 09/02/2019 0557 Gross per 24 hour  Intake 2621.56 ml  Output 600 ml  Net 2021.56 ml   Filed Weights   08/12/19 0000  Weight: 103.4 kg   Weight change:   Body mass index is 41.69 kg/m.  Intake/Output  from previous day: 01/30 0701 - 01/31 0700 In: 2621.6 [P.O.:591; IV Piggyback:2030.6] Out: 600 [Urine:600] Intake/Output this shift: No intake/output data recorded.  Examination:  General exam: AAO,NAD, weak/frail HEENT:Oral mucosa moist, Ear/Nose WNL grossly, dentition normal. Respiratory system: Diminished breath sounds on the right upper chest but fairly equal breath sounds no wheezing. Cardiovascular system: S1 & S2 +, No JVD, regular RR. Gastrointestinal system: Abdomen soft, NT,ND, BS+ Nervous System:Alert, awake, moving extremities and grossly nonfocal Extremities: No edema, distal peripheral pulses palpable.  Skin: No rashes,no icterus. MSK: Normal muscle bulk,tone, power  Data Reviewed: I have personally reviewed following labs and imaging studies  CBC: Recent Labs  Lab 08/28/19 0712 08/28/19 1122 08/29/19 0434 08/29/19 1913 08/31/19 0506 09/02/19 0546  WBC 9.8  --  9.2 7.9 7.9 7.1  NEUTROABS  --   --   --  5.6  --   --   HGB 11.0*  --  10.4* 10.4* 9.4* 10.1*  HCT 35.2* 32.8* 33.4* 33.6* 29.7* 32.7*  MCV 86.9  --  86.3 87.7 85.1 87.0  PLT 300  --  306 276 254 0000000   Basic Metabolic Panel: Recent Labs  Lab 08/28/19 0712 08/29/19 0434 08/31/19 0506 09/02/19 0546  NA 137 138 137 138  K 4.0 3.6 3.4* 4.0  CL 103 102 102 105  CO2 23 25 25 24   GLUCOSE 90 102* 132* 101*  BUN 6 5* 6 8  CREATININE 0.60* 0.73 0.73 0.59*  CALCIUM 8.6* 8.8* 8.7* 8.7*   GFR: Estimated Creatinine Clearance: 147.9 mL/min (A) (by C-G formula based on SCr of 0.59 mg/dL (L)). Liver Function Tests: Recent Labs  Lab 08/29/19 0434  AST 13*  ALT 23  ALKPHOS 93  BILITOT 1.3*  PROT 7.3  ALBUMIN 2.8*   No results for input(s): LIPASE, AMYLASE in the last 168 hours. No results for input(s): AMMONIA in the last 168 hours. Coagulation Profile: No results for input(s): INR, PROTIME in the last 168 hours. Cardiac Enzymes: No results for input(s): CKTOTAL, CKMB, CKMBINDEX, TROPONINI in  the last 168 hours. BNP (last 3 results) No results for input(s): PROBNP in the last 8760 hours. HbA1C: No results for input(s): HGBA1C in the last 72 hours. CBG: No results for input(s): GLUCAP in the last 168 hours. Lipid Profile: No results for input(s): CHOL, HDL, LDLCALC, TRIG, CHOLHDL, LDLDIRECT in the last 72 hours. Thyroid Function Tests: No results for input(s): TSH, T4TOTAL, FREET4, T3FREE, THYROIDAB in the last 72 hours. Anemia Panel: No results for input(s): VITAMINB12, FOLATE,  FERRITIN, TIBC, IRON, RETICCTPCT in the last 72 hours. Sepsis Labs: Recent Labs  Lab 08/28/19 0302  LATICACIDVEN 0.7    Recent Results (from the past 240 hour(s))  Culture, blood (routine x 2)     Status: None   Collection Time: 08/25/19  5:36 AM   Specimen: BLOOD  Result Value Ref Range Status   Specimen Description   Final    BLOOD LEFT ANTECUBITAL Performed at [facility], [address] [place], [place] [address]    Special Requests   Final    BOTTLES DRAWN AEROBIC AND ANAEROBIC Blood Culture adequate volume Performed at [facility], [address] [place], [place] [address]    Culture   Final    NO GROWTH 5 DAYS Performed at [facility], [place] [address], [place], [place] [address]    Report Status 08/30/2019 FINAL  Final  Culture, blood (routine x 2)     Status: None   Collection Time: 08/25/19  5:46 AM   Specimen: BLOOD  Result Value Ref Range Status   Specimen Description   Final    BLOOD LEFT ARM Performed at [facility], [address] [place], [place] [address]    Special Requests   Final    BOTTLES DRAWN AEROBIC AND ANAEROBIC Blood Culture results may not be optimal due to an excessive volume of blood received in culture bottles Performed at [facility], [address] [place], [place] [address]    Culture   Final    NO GROWTH 5 DAYS Performed at [facility], [place] [address], [place], [place] [address]      Report Status 08/30/2019 FINAL  Final  Culture, blood (routine x 2)     Status: None (Preliminary result)   Collection Time: 08/29/19  7:13 PM   Specimen: BLOOD  Result Value Ref Range Status   Specimen Description   Final    BLOOD LEFT ANTECUBITAL Performed at [facility] [address], [place], [place] [address]    Special Requests   Final    BOTTLES DRAWN AEROBIC AND ANAEROBIC Blood Culture adequate volume Performed at [facility] [address], [place], [place] [address]    Culture   Final    NO GROWTH 3 DAYS Performed at [facility], [place] [address] [place], [place] [address]    Report Status PENDING  Incomplete  Culture, blood (routine x 2)     Status: None (Preliminary result)   Collection Time: 08/29/19  7:18 PM   Specimen: BLOOD RIGHT HAND  Result Value Ref Range Status   Specimen Description   Final    BLOOD RIGHT HAND Performed at [facility] [address] [place], [place] [address]    Special Requests   Final    BOTTLES DRAWN AEROBIC ONLY Blood Culture adequate volume Performed at [facility] [address], [place], [place] [address]    Culture   Final    NO GROWTH 3 DAYS Performed at [facility], [place] [address] [place],  [address]    Report Status PENDING  Incomplete      Radiology Studies: No results found.    LOS: 22 days   Time spent: More than 50% of that time was spent in counseling and/or coordination of care.  Antonieta Pert, MD [facility]  09/02/2019, 9:45 AM

## 2019-09-03 LAB — CULTURE, BLOOD (ROUTINE X 2)
Culture: NO GROWTH
Culture: NO GROWTH
Special Requests: ADEQUATE
Special Requests: ADEQUATE

## 2019-09-03 NOTE — Progress Notes (Signed)
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  In the ER found to be septic with lactic acidosis, leukocytosis and fever and tachycardia and was admitted.  Blood culture grew MSSA and echo showed endocarditis right-sided, seen by infectious disease and has been on antibiotic with plan to continue Ancef from 1/14-1/28-then consider long-acting infusion.  Patient had fall 1/22 without LOC.  He has been having recurrent fever, ID was notified and repeat blood culture ordered 1/23 -Neg so far.  Patient also had pleuritic chest pain troponin negative cannot rule out PE on VQ scan and TTE and placed on Eliquis 1/25 but had episode of vomiting frank blood on 1/25 and GI was consulted.  Patient has been noncompliant with medical management at times refusing blood test imaging and treatments. Patient agreed for CTA chest 1/27-that showed patchy bilateral pneumonia, no PE-and his Eliquis was discontinued.  Blood cultures were ordered and ID was reconsulted.  Subsequently patient has become fever free chest pain significantly improved.  Subjective: Sting comfortably denies any new complaints. No acute events overnight.  Afebrile T-max 99.1.  Saturating well on room air.  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 As per ID cont Ancef till 09/10/19- then outpatient oritavancin/dalbavancin for long  acting medication vs linezolid to complete 6 wk-we will need to discuss with ID around 09/09/19.  CTA chest 1/27  "Multifocal pneumonia. Consolidation is greatest in the anterior segment right upper lobe. There is also consolidation in portions of the anterior and lateral segments of the left lower lobe. More ill-defined infiltrate noted in the apical segment right upper lobe.Right pleural effusion with compressive atelectasis in the right base. There may be a degree of superimposed pneumonia in this area as well"   Pleuritic chest pain: Improved.  No PE.  VQ scan was indeterminate and given this presentation patient was empirically placed on anticoagulation with Eliquis for several days  and has been discontinued 1/27 after negative CTA chest CT for PE.  Episode of frank blood vomiting 1/25/?  Upper GI bleed seen by GI patient  did not want endoscopy, on Protonix PPI twice daily hemoglobin overall stable monitor. Off Eliquis as there is no PE on the CT scan.  Advance to regular diet.    Anemia suspecting due to chronic disease, some component Acute blood loss anemia. Hb stable. Recent Labs  Lab 08/28/19 0712 08/28/19 1122 08/29/19 0434 08/29/19 1913 08/31/19 0506 09/02/19 0546  HGB 11.0*  --  10.4* 10.4* 9.4* 10.1*  HCT 35.2* 32.8* 33.4* 33.6* 29.7* 32.7*   Constipation; improved with mag citrate.  Thrombocytopenia: Resolved  IV drug abuse: Cessation advised.  No evidence of withdrawal.  Monitor.  Schizophrenia/bipolar disorder on Seroquel and Haldol at night. Stable mood.  Fall in the hospital, possibly orthostatic?Marland Kitchen  PT was consulted and patient declined.  He has been ambulating in the room in the hallway without any issues since then.  Medical noncompliance refusing testing  care plan deviously and he seems to be more compliant now and agreeable to stay till feb 8 for 4 wks of iv antibiotics.  Mild hypokalemia will replete.  Body mass index is 41.69 kg/m.   DVT prophylaxis:  eliquis-> Lovenox Code Status: Full code Family Communication: plan of care discussed with patient at bedside. Dr Velvet Bathe his mother Ms. Tamu [phone] and left a voicemail message while in the patient's room 1/26.  Patient aware about ongoing plan.  No family at bedside. Disposition Plan: Remains inpatient pending completion of iv antibiotic until February 8,-Unable to discharge with PICC line given IV drug abuse.  Consultants: ID, psychiatry, [facility] Procedures:see note  CTA 1."No appreciable pulmonary embolus. No thoracic aortic aneurysm or dissection.  2. Multifocal pneumonia. Consolidation is greatest in the anterior segment right upper lobe. There is also consolidation in portions of the anterior and lateral segments of the left lower lobe. More ill-defined infiltrate noted in the apical segment right upper lobe.  3. Right pleural effusion with compressive atelectasis in the right base. There may be a degree of superimposed pneumonia in this area as well.  4.  No evident adenopathy"   VQ scan 1/24  IMPRESSION: Wedge-shaped perfusion defect in the anterior right upper lobe corresponding to airspace disease seen on chest x-ray. Without ventilation, this is indeterminate for pulmonary embolus. Recommend CTA chest once adequate IV access is obtained.  Microbiology: Antimicrobials: Anti-infectives (From admission, onward)   Start     Dose/Rate Route Frequency Ordered Stop   08/12/19 1800  ceFAZolin (ANCEF) IVPB 2g/100 mL premix     2 g 200 mL/hr over 30 Minutes Intravenous Every 8 hours 08/12/19 1251     08/11/19 1400  metroNIDAZOLE (FLAGYL) IVPB 500 mg  Status:  Discontinued     500 mg 100 mL/hr over 60 Minutes Intravenous Every 8 hours 08/11/19 0556 08/12/19 1229   08/11/19 1000  ceFEPIme (MAXIPIME) 2 g in sodium chloride 0.9 % 100 mL IVPB  Status:  Discontinued     2 g 200 mL/hr over 30 Minutes Intravenous Every 8 hours 08/11/19 0330 08/12/19 1229    08/11/19 0600  vancomycin (VANCOREADY) IVPB 1750 mg/350 mL  Status:  Discontinued     1,750 mg 175 mL/hr over 120 Minutes Intravenous Every 12 hours 08/11/19 0330 08/11/19 0335   08/11/19 0600  vancomycin (VANCOREADY) IVPB 1250 mg/250 mL  Status:  Discontinued     1,250 mg 166.7 mL/hr over 90 Minutes Intravenous Every 12 hours 08/11/19 0335 08/13/19 0928   08/11/19 0200  ceFEPIme (MAXIPIME) 2 g in sodium chloride 0.9 % 100 mL IVPB     2 g 200 mL/hr over 30 Minutes Intravenous  Once 08/11/19 0149 08/11/19 0257   08/11/19 0145  metroNIDAZOLE (FLAGYL) IVPB 500 mg     500 mg 100 mL/hr over 60 Minutes Intravenous  Once 08/11/19 0135 08/11/19 0623   08/11/19 0145  vancomycin (VANCOCIN) IVPB 1000 mg/200 mL premix     1,000 mg 200 mL/hr over 60 Minutes Intravenous  Once 08/11/19 0142 08/11/19 0509      Medications: Scheduled Meds:  enoxaparin (LOVENOX) injection  40 mg Subcutaneous Q24H   feeding supplement (ENSURE ENLIVE)  237 mL Oral BID BM   haloperidol  2 mg Oral QHS   mouth rinse  15 mL Mouth Rinse BID   pantoprazole  40 mg Oral BID   polyethylene glycol  17 g Oral BID   QUEtiapine  300 mg Oral QHS  senna-docusate  2 tablet Oral BID   Continuous Infusions:  sodium chloride 250 mL (08/31/19 0301)    ceFAZolin (ANCEF) IV 2 g (09/03/19 0256)    Objective: Vitals:   09/02/19 1854 09/02/19 2014 09/02/19 2343 09/03/19 0542  BP: 119/78 127/82 125/78 127/63  Pulse: 97 (!) 103 97 69  Resp: (!) 22 19 19 18   Temp: 99.1 F (37.3 C) 98.7 F (37.1 C) 97.9 F (36.6 C) 98.2 F (36.8 C)  TempSrc: Oral Oral Oral Oral  SpO2: 100% 99% 100% 98%  Weight:      Height:        Intake/Output Summary (Last 24 hours) at 09/03/2019 0723 Last data filed at 09/03/2019 0340 Gross per 24 hour  Intake 934 ml  Output 340 ml  Net 594 ml   Filed Weights   08/12/19 0000  Weight: 103.4 kg   Weight change:   Body mass index is 41.69 kg/m.  Intake/Output from previous day: 01/31 0701 -  02/01 0700 In: 934 [P.O.:834; IV Piggyback:100] Out: 340 [Urine:340] Intake/Output this shift: No intake/output data recorded.  Examination: General exam: AAO,NAD, weak/frail HEENT:Oral mucosa moist, Ear/Nose WNL grossly, dentition normal. Respiratory system: Bilaterally clear breath sounds,NT,no use of accessory muscle Cardiovascular system: S1 & S2 +, No JVD, regular RR. Gastrointestinal system: Abdomen soft, NT,ND, BS+ Nervous System:Alert, awake, moving extremities and grossly nonfocal Extremities: No edema, distal peripheral pulses palpable.  Skin: No rashes,no icterus. MSK: Normal muscle bulk,tone, power  Data Reviewed: I have personally reviewed following labs and imaging studies  CBC: Recent Labs  Lab 08/28/19 0712 08/28/19 1122 08/29/19 0434 08/29/19 1913 08/31/19 0506 09/02/19 0546  WBC 9.8  --  9.2 7.9 7.9 7.1  NEUTROABS  --   --   --  5.6  --   --   HGB 11.0*  --  10.4* 10.4* 9.4* 10.1*  HCT 35.2* 32.8* 33.4* 33.6* 29.7* 32.7*  MCV 86.9  --  86.3 87.7 85.1 87.0  PLT 300  --  306 276 254 0000000   Basic Metabolic Panel: Recent Labs  Lab 08/28/19 0712 08/29/19 0434 08/31/19 0506 09/02/19 0546  NA 137 138 137 138  K 4.0 3.6 3.4* 4.0  CL 103 102 102 105  CO2 23 25 25 24   GLUCOSE 90 102* 132* 101*  BUN 6 5* 6 8  CREATININE 0.60* 0.73 0.73 0.59*  CALCIUM 8.6* 8.8* 8.7* 8.7*   GFR: Estimated Creatinine Clearance: 147.9 mL/min (A) (by C-G formula based on SCr of 0.59 mg/dL (L)). Liver Function Tests: Recent Labs  Lab 08/29/19 0434  AST 13*  ALT 23  ALKPHOS 93  BILITOT 1.3*  PROT 7.3  ALBUMIN 2.8*   No results for input(s): LIPASE, AMYLASE in the last 168 hours. No results for input(s): AMMONIA in the last 168 hours. Coagulation Profile: No results for input(s): INR, PROTIME in the last 168 hours. Cardiac Enzymes: No results for input(s): CKTOTAL, CKMB, CKMBINDEX, TROPONINI in the last 168 hours. BNP (last 3 results) No results for input(s):  PROBNP in the last 8760 hours. HbA1C: No results for input(s): HGBA1C in the last 72 hours. CBG: No results for input(s): GLUCAP in the last 168 hours. Lipid Profile: No results for input(s): CHOL, HDL, LDLCALC, TRIG, CHOLHDL, LDLDIRECT in the last 72 hours. Thyroid Function Tests: No results for input(s): TSH, T4TOTAL, FREET4, T3FREE, THYROIDAB in the last 72 hours. Anemia Panel: No results for input(s): VITAMINB12, FOLATE, FERRITIN, TIBC, IRON, RETICCTPCT in the last 72 hours. Sepsis Labs:  Recent Labs  Lab 08/28/19 0302  LATICACIDVEN 0.7    Recent Results (from the past 240 hour(s))  Culture, blood (routine x 2)     Status: None   Collection Time: 08/25/19  5:36 AM   Specimen: BLOOD  Result Value Ref Range Status   Specimen Description   Final    BLOOD LEFT ANTECUBITAL Performed at [facility], [address] [place], [place] [address]    Special Requests   Final    BOTTLES DRAWN AEROBIC AND ANAEROBIC Blood Culture adequate volume Performed at [facility], [address] [place], [place] [address]    Culture   Final    NO GROWTH 5 DAYS Performed at [facility], [place] [address], [place], [place] [address]    Report Status 08/30/2019 FINAL  Final  Culture, blood (routine x 2)     Status: None   Collection Time: 08/25/19  5:46 AM   Specimen: BLOOD  Result Value Ref Range Status   Specimen Description   Final    BLOOD LEFT ARM Performed at [facility], [address] [place], [place] [address]    Special Requests   Final    BOTTLES DRAWN AEROBIC AND ANAEROBIC Blood Culture results may not be optimal due to an excessive volume of blood received in culture bottles Performed at [facility], [address] [place], [place] [address]    Culture   Final    NO GROWTH 5 DAYS Performed at [facility], [place] [address], [place], [place] [address]    Report Status 08/30/2019 FINAL  Final  Culture, blood (routine x  2)     Status: None (Preliminary result)   Collection Time: 08/29/19  7:13 PM   Specimen: BLOOD  Result Value Ref Range Status   Specimen Description   Final    BLOOD LEFT ANTECUBITAL Performed at [facility] [address] [place], [place] [address]    Special Requests   Final    BOTTLES DRAWN AEROBIC AND ANAEROBIC Blood Culture adequate volume Performed at [facility] [address], [place], [place] [address]    Culture   Final    NO GROWTH 4 DAYS Performed at [facility], [place] [address] [place], [place] [address]    Report Status PENDING  Incomplete  Culture, blood (routine x 2)     Status: None (Preliminary result)   Collection Time: 08/29/19  7:18 PM   Specimen: BLOOD RIGHT HAND  Result Value Ref Range Status   Specimen Description   Final    BLOOD RIGHT HAND Performed at [facility] [address] [place], [place] [address]    Special Requests   Final    BOTTLES DRAWN AEROBIC ONLY Blood Culture adequate volume Performed at [facility] [address] [place], [place] [address]    Culture   Final    NO GROWTH 4 DAYS Performed at [facility], [place] [address], [place], [place] [address]    Report Status PENDING  Incomplete      Radiology Studies: No results found.    LOS: 23 days   Time spent: More than 50% of that time was spent in counseling and/or coordination of care.  Antonieta Pert, MD [facility]  09/03/2019, 7:23 AM

## 2019-09-04 NOTE — Progress Notes (Signed)
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  In the ER found to be septic with lactic acidosis, leukocytosis and fever and tachycardia and was admitted.  Blood culture grew MSSA and echo showed endocarditis right-sided, seen by infectious disease and has been on antibiotic with plan to continue Ancef from 1/14-1/28-then consider long-acting infusion.  Patient had fall 1/22 without LOC.  He has been having recurrent fever, ID was notified and repeat blood culture ordered 1/23 -Neg so far.  Patient also had pleuritic chest pain troponin negative cannot rule out PE on VQ scan and TTE and placed on Eliquis 1/25 but had episode of vomiting frank blood on 1/25 and GI was consulted.  Patient has been noncompliant with medical management at times refusing blood test imaging and treatments. Patient agreed for CTA chest 1/27-that showed patchy bilateral pneumonia, no PE-and his Eliquis was discontinued.  Blood cultures were ordered and ID was reconsulted.  Subsequently patient has become fever free chest pain significantly improved. Patient remains hospitalized for IV antibiotics till FEB 8.  Subjective: Sleeping in the bed denies any complaints. Afebrile overnight T-max 98.5. 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 As per ID cont Ancef till 09/10/19- then outpatient oritavancin/dalbavancin for long acting medication vs linezolid to  complete 6 wk-we will need to discuss with ID around 09/09/19.  CTA chest 1/27  "Multifocal pneumonia. Consolidation is greatest in the anterior segment right upper lobe. There is also consolidation in portions of the anterior and lateral segments of the left lower lobe. More ill-defined infiltrate noted in the apical segment right upper lobe.Right pleural effusion with compressive atelectasis in the right base. There may be a degree of superimposed pneumonia in this area as well"   Pleuritic chest pain: Improved.  No PE.  VQ scan was indeterminate and given this presentation patient was empirically placed on anticoagulation with Eliquis for several days  and has been discontinued 1/27 after negative CTA chest CT for PE.  Episode of frank blood vomiting 1/25/?  Upper GI bleed seen by GI patient  did not want endoscopy, on Protonix PPI twice daily hemoglobin overall stable monitor. Off Eliquis as there is no PE on the CT scan.  Advance to regular diet.    Anemia suspecting due to chronic disease, some component Acute blood loss anemia. Hb stable. Recent Labs  Lab 08/28/19 1122 08/29/19 0434 08/29/19 1913 08/31/19 0506 09/02/19 0546  HGB  --  10.4* 10.4* 9.4* 10.1*  HCT 32.8* 33.4* 33.6* 29.7* 32.7*   Constipation; improved with mag citrate.  Thrombocytopenia: Resolved  IV drug abuse: Cessation advised.  No evidence of withdrawal.  Monitor.  Schizophrenia/bipolar disorder on Seroquel and Haldol at night. Stable mood.  Fall in the hospital, possibly orthostatic?Marland Kitchen  PT was consulted and patient declined.  He has been ambulating in the room in the hallway without any issues since then.  Medical noncompliance refusing testing care plan deviously and he seems to be more  compliant now and agreeable to stay till feb 8 for 4 wks of iv antibiotics. he is more agreeable with plan of care.  Follow-up routine labs in a.m.  Body mass index is 41.69 kg/m.   DVT prophylaxis: eliquis-> Lovenox Code  Status: Full code Family Communication: plan of care discussed with patient at bedside. Dr Velvet Bathe his mother Ms. Tamu [phone] and left a voicemail message while in the patient's room 1/26.  Patient aware about ongoing plan.  No family at bedside. Disposition Plan: Remains inpatient pending completion of iv antibiotic until February 8,-Unable to discharge with PICC line given IV drug abuse.  Consultants: ID, psychiatry, [facility] Procedures:see note  CTA 1."No appreciable pulmonary embolus. No thoracic aortic aneurysm or dissection.  2. Multifocal pneumonia. Consolidation is greatest in the anterior segment right upper lobe. There is also consolidation in portions of the anterior and lateral segments of the left lower lobe. More ill-defined infiltrate noted in the apical segment right upper lobe.  3. Right pleural effusion with compressive atelectasis in the right base. There may be a degree of superimposed pneumonia in this area as well.  4.  No evident adenopathy"   VQ scan 1/24  IMPRESSION: Wedge-shaped perfusion defect in the anterior right upper lobe corresponding to airspace disease seen on chest x-ray. Without ventilation, this is indeterminate for pulmonary embolus. Recommend CTA chest once adequate IV access is obtained.  Microbiology: Antimicrobials: Anti-infectives (From admission, onward)   Start     Dose/Rate Route Frequency Ordered Stop   08/12/19 1800  ceFAZolin (ANCEF) IVPB 2g/100 mL premix     2 g 200 mL/hr over 30 Minutes Intravenous Every 8 hours 08/12/19 1251     08/11/19 1400  metroNIDAZOLE (FLAGYL) IVPB 500 mg  Status:  Discontinued     500 mg 100 mL/hr over 60 Minutes Intravenous Every 8 hours 08/11/19 0556 08/12/19 1229   08/11/19 1000  ceFEPIme (MAXIPIME) 2 g in sodium chloride 0.9 % 100 mL IVPB  Status:  Discontinued     2 g 200 mL/hr over 30 Minutes Intravenous Every 8 hours 08/11/19 0330 08/12/19 1229   08/11/19 0600  vancomycin  (VANCOREADY) IVPB 1750 mg/350 mL  Status:  Discontinued     1,750 mg 175 mL/hr over 120 Minutes Intravenous Every 12 hours 08/11/19 0330 08/11/19 0335   08/11/19 0600  vancomycin (VANCOREADY) IVPB 1250 mg/250 mL  Status:  Discontinued     1,250 mg 166.7 mL/hr over 90 Minutes Intravenous Every 12 hours 08/11/19 0335 08/13/19 0928   08/11/19 0200  ceFEPIme (MAXIPIME) 2 g in sodium chloride 0.9 % 100 mL IVPB     2 g 200 mL/hr over 30 Minutes Intravenous  Once 08/11/19 0149 08/11/19 0257   08/11/19 0145  metroNIDAZOLE (FLAGYL) IVPB 500 mg     500 mg 100 mL/hr over 60 Minutes Intravenous  Once 08/11/19 0135 08/11/19 0623   08/11/19 0145  vancomycin (VANCOCIN) IVPB 1000 mg/200 mL premix     1,000 mg 200 mL/hr over 60 Minutes Intravenous  Once 08/11/19 0142 08/11/19 0509      Medications: Scheduled Meds: . enoxaparin (LOVENOX) injection  40 mg Subcutaneous Q24H  . feeding supplement (ENSURE ENLIVE)  237 mL Oral BID BM  . haloperidol  2 mg Oral QHS  . mouth rinse  15 mL Mouth Rinse BID  . pantoprazole  40 mg Oral BID  . polyethylene glycol  17 g Oral BID  . QUEtiapine  300 mg Oral QHS  .  senna-docusate  2 tablet Oral BID   Continuous Infusions: . sodium chloride 250 mL (08/31/19 0301)  .  ceFAZolin (ANCEF) IV 2 g (09/04/19 0217)    Objective: Vitals:   09/03/19 0542 09/03/19 1448 09/03/19 1949 09/04/19 0444  BP: 127/63 111/77 126/84 108/63  Pulse: 69 (!) 102 (!) 103 94  Resp: 18 16 16 17   Temp: 98.2 F (36.8 C) 98.5 F (36.9 C) 98.5 F (36.9 C) 98.2 F (36.8 C)  TempSrc: Oral Oral    SpO2: 98% 100% 97% 97%  Weight:      Height:        Intake/Output Summary (Last 24 hours) at 09/04/2019 0759 Last data filed at 09/04/2019 0317 Gross per 24 hour  Intake 1588.68 ml  Output --  Net 1588.68 ml   Filed Weights   08/12/19 0000  Weight: 103.4 kg   Weight change:   Body mass index is 41.69 kg/m.  Intake/Output from previous day: 02/01 0701 - 02/02 0700 In: 1588.7  [P.O.:1016; I.V.:172.8; IV Piggyback:399.9] Out: -  Intake/Output this shift: No intake/output data recorded.  Examination: General exam: AAOx3, NAD, obese, on room air.   HEENT:Oral mucosa moist, Ear/Nose WNL grossly, dentition normal. Respiratory system:  CTA BL, NAD,no use of accessory muscle Cardiovascular system: S1 & S2 +, No JVD, regular RR. Gastrointestinal system: Abdomen soft, NT,ND, BS+ Nervous System:Alert, awake, moving extremities and grossly nonfocal Extremities: No edema, distal peripheral pulses palpable.  Skin: No rashes,no icterus. MSK: Normal muscle bulk,tone, power  Data Reviewed: I have personally reviewed following labs and imaging studies  CBC: Recent Labs  Lab 08/28/19 1122 08/29/19 0434 08/29/19 1913 08/31/19 0506 09/02/19 0546  WBC  --  9.2 7.9 7.9 7.1  NEUTROABS  --   --  5.6  --   --   HGB  --  10.4* 10.4* 9.4* 10.1*  HCT 32.8* 33.4* 33.6* 29.7* 32.7*  MCV  --  86.3 87.7 85.1 87.0  PLT  --  306 276 254 0000000   Basic Metabolic Panel: Recent Labs  Lab 08/29/19 0434 08/31/19 0506 09/02/19 0546  NA 138 137 138  K 3.6 3.4* 4.0  CL 102 102 105  CO2 25 25 24   GLUCOSE 102* 132* 101*  BUN 5* 6 8  CREATININE 0.73 0.73 0.59*  CALCIUM 8.8* 8.7* 8.7*   GFR: Estimated Creatinine Clearance: 147.9 mL/min (A) (by C-G formula based on SCr of 0.59 mg/dL (L)). Liver Function Tests: Recent Labs  Lab 08/29/19 0434  AST 13*  ALT 23  ALKPHOS 93  BILITOT 1.3*  PROT 7.3  ALBUMIN 2.8*   No results for input(s): LIPASE, AMYLASE in the last 168 hours. No results for input(s): AMMONIA in the last 168 hours. Coagulation Profile: No results for input(s): INR, PROTIME in the last 168 hours. Cardiac Enzymes: No results for input(s): CKTOTAL, CKMB, CKMBINDEX, TROPONINI in the last 168 hours. BNP (last 3 results) No results for input(s): PROBNP in the last 8760 hours. HbA1C: No results for input(s): HGBA1C in the last 72 hours. CBG: No results for  input(s): GLUCAP in the last 168 hours. Lipid Profile: No results for input(s): CHOL, HDL, LDLCALC, TRIG, CHOLHDL, LDLDIRECT in the last 72 hours. Thyroid Function Tests: No results for input(s): TSH, T4TOTAL, FREET4, T3FREE, THYROIDAB in the last 72 hours. Anemia Panel: No results for input(s): VITAMINB12, FOLATE, FERRITIN, TIBC, IRON, RETICCTPCT in the last 72 hours. Sepsis Labs: No results for input(s): PROCALCITON, LATICACIDVEN in the last 168 hours.  Recent Results (  from the past 240 hour(s))  Culture, blood (routine x 2)     Status: None   Collection Time: 08/29/19  7:13 PM   Specimen: BLOOD  Result Value Ref Range Status   Specimen Description   Final    BLOOD LEFT ANTECUBITAL Performed at [facility] [address], [place], [place] [address]    Special Requests   Final    BOTTLES DRAWN AEROBIC AND ANAEROBIC Blood Culture adequate volume Performed at [facility] [address], [place], [place] [address]    Culture   Final    NO GROWTH 5 DAYS Performed at [facility], [place] [address], [place], [place] [address]    Report Status 09/03/2019 FINAL  Final  Culture, blood (routine x 2)     Status: None   Collection Time: 08/29/19  7:18 PM   Specimen: BLOOD RIGHT HAND  Result Value Ref Range Status   Specimen Description   Final    BLOOD RIGHT HAND Performed at [facility] [address], [place], [place] [address]    Special Requests   Final    BOTTLES DRAWN AEROBIC ONLY Blood Culture adequate volume Performed at [facility] [address] [place], [place] [address]    Culture   Final    NO GROWTH 5 DAYS Performed at [facility], [place] [address], [place], [place] [address]    Report Status 09/03/2019 FINAL  Final      Radiology Studies: No results found.    LOS: 24 days   Time spent: More than 50% of that time was spent in counseling and/or coordination of  care.  Antonieta Pert, MD [facility]  09/04/2019, 7:59 AM

## 2019-09-04 NOTE — Progress Notes (Signed)
Pt a high fall risk due to previous fall this admission.  Bed alarm initiated and re-educated the pt on the importance of him calling for assistance when needing to get OOB due to history of fall and to maintain safety.  Pt stated he has been walking around and will not fall.  When bed alarm alerted, pt observed turning off the alarm.  Pt refusing to leave alarm on.  Pt verbalized understanding that if he falls, could result in major injury.

## 2019-09-05 DIAGNOSIS — D696 Thrombocytopenia, unspecified: Secondary | ICD-10-CM

## 2019-09-05 DIAGNOSIS — Z720 Tobacco use: Secondary | ICD-10-CM

## 2019-09-05 LAB — CBC
HCT: 37.6 % — ABNORMAL LOW (ref 39.0–52.0)
Hemoglobin: 11.6 g/dL — ABNORMAL LOW (ref 13.0–17.0)
MCH: 26.9 pg (ref 26.0–34.0)
MCHC: 30.9 g/dL (ref 30.0–36.0)
MCV: 87.2 fL (ref 80.0–100.0)
Platelets: 419 10*3/uL — ABNORMAL HIGH (ref 150–400)
RBC: 4.31 MIL/uL (ref 4.22–5.81)
RDW: 14.7 % (ref 11.5–15.5)
WBC: 8.9 10*3/uL (ref 4.0–10.5)
nRBC: 0 % (ref 0.0–0.2)

## 2019-09-05 LAB — COMPREHENSIVE METABOLIC PANEL
ALT: 30 U/L (ref 0–44)
AST: 30 U/L (ref 15–41)
Albumin: 3.1 g/dL — ABNORMAL LOW (ref 3.5–5.0)
Alkaline Phosphatase: 85 U/L (ref 38–126)
Anion gap: 12 (ref 5–15)
BUN: 12 mg/dL (ref 6–20)
CO2: 22 mmol/L (ref 22–32)
Calcium: 9.1 mg/dL (ref 8.9–10.3)
Chloride: 102 mmol/L (ref 98–111)
Creatinine, Ser: 0.67 mg/dL (ref 0.61–1.24)
GFR calc Af Amer: >60 mL/min (ref >60)
GFR calc non Af Amer: >60 mL/min (ref >60)
Glucose, Bld: 121 mg/dL — ABNORMAL HIGH (ref 70–99)
Potassium: 5 mmol/L (ref 3.5–5.1)
Sodium: 136 mmol/L (ref 135–145)
Total Bilirubin: 0.9 mg/dL (ref 0.3–1.2)
Total Protein: 7.7 g/dL (ref 6.5–8.1)

## 2019-09-05 NOTE — Progress Notes (Signed)
PROGRESS NOTE    Justin Chaney  [id number] DOB: 1996/04/12 DOA: 08/10/2019 PCP: Patient, No Pcp Per   Brief Narrative: Justin Chaney is a 24 y.o. 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 Patient presented secondary to fevers/cihlls/myalgias and found to have evidence of sepsis secondary to MSSA bacteremia, TV endocarditis. During treatment course, he developed multifocal pneumonia, likely secondary to septic emboli.   Assessment & Plan:   Principal Problem:   Staphylococcus aureus bacteremia Active Problems:   Schizoaffective disorder, bipolar type (HCC)   Tobacco use   SIRS (systemic inflammatory response syndrome) (HCC)   Normocytic anemia   Thrombocytopenia (HCC)   Polysubstance abuse (HCC)   IVDU (intravenous drug user)   S/P VP shunt   Transaminitis   Hyperbilirubinemia   Sepsis with acute organ dysfunction without septic shock (HCC)   Sepsis Present on admission secondary to bacteremia, endocarditis, multifocal pneumonia.  Resolved with antibiotics.  Staphylococcus aureus bacteremia Likely secondary to IV drug use.  Patient has a history of injecting methamphetamine.  Staph aureus is methicillin sensitive.  Patient initially empirically treated with vancomycin, Flagyl, cefepime and was transitioned to ceftriaxone IV for continued treatment of bacteremia/endocarditis.  Plan is for 6 weeks of total antibiotics with a 4-week plan for IV cefazolin transition to oritavancin versus oral antibiotics per ID recommendations.  4-week course of cefazolin will last through until February 8.  Repeat blood cultures (08/29/2019) significant for no growth. -Continue cefazolin 2 g IV every 8 hours  Tricuspid valve endocarditis Pulmonary septic emboli/pneumonia Agitation seen on transesophageal echocardiogram from 08/14/2019.  CT chest on 08/29/2019 significant for multifocal pneumonia with associated right pleural effusion  and compressive atelectasis of the right base.  Patient being treated as mentioned above.  Patient currently with no associated symptoms.  Currently on room air. -Will obtain a 2-view chest x-ray in 2 days  Pleuritic chest pain Loculated secondary to pneumonia as mentioned above.  CTA negative for acute PE.  Improved.  Hematemesis Noted on chart review.  GI consulted and patient declined upper GI evaluation.  Patient placed on Protonix twice daily.  Anemia Likely anemia related to severe illness. Patient also had an episode of hematemesis. Patient declined EGD. Hemoglobin is stable. Anemia panel suggests chronic disease.  Constipation Improved with magnesium citrate.  Thrombocytopenia Likely secondary to sepsis. Resolved.  IV drug abuse Patient has a history of   Schizophrenia Bipolar disorder -Continue Seroquel and Haldol  Fall Occurred during hospitalization. Patient declined PT evaluation. No recurrent falls with ambulation. Per chart review, possibly related to orthostatic hypotension.   DVT prophylaxis: Lovenox Code Status:   Code Status: Full Code Family Communication: None at bedside Disposition Plan: Discharge home pending completion of IV antibiotics   Consultants:   Infectious disease  Procedures:   TRANSTHORACIC ECHOCARDIOGRAM (08/12/2019) IMPRESSIONS    1. Left ventricular ejection fraction, by visual estimation, is 60 to  65%. The left ventricle has normal function. There is no left ventricular  hypertrophy.  2. Global right ventricle has normal systolic function.The right  ventricular size is normal.  3. Left atrial size was normal.  4. Right atrial size was normal.  5. The mitral valve is normal in structure. Trivial mitral valve  regurgitation. No evidence of mitral stenosis.  6. The tricuspid valve is normal in structure.  7. The aortic valve is tricuspid. Aortic valve regurgitation is not  visualized. No evidence of aortic valve  sclerosis or stenosis.  8. The  pulmonic valve was not well visualized. Pulmonic valve  regurgitation is not visualized.  9. The inferior vena cava is normal in size with greater than 50%  respiratory variability, suggesting right atrial pressure of 3 mmHg.  10. Normal LV function; no obvious vegetations.  11. The left ventricle has no regional wall motion abnormalities.   TRANSESOPHAGEAL ECHOCARDIOGRAM (08/14/2019) IMPRESSIONS    1. Left ventricular ejection fraction, by visual estimation, is 60 to  65%. The left ventricle has normal function. There is no left ventricular  hypertrophy.  2. The left ventricle has no regional wall motion abnormalities.  3. Global right ventricle has normal systolic function.The right  ventricular size is normal. No increase in right ventricular wall  thickness.  4. Left atrial size was normal.  5. Right atrial size was normal.  6. The pericardial effusion is circumferential.  7. Trivial pericardial effusion is present.  8. The mitral valve is grossly normal. Trivial mitral valve  regurgitation.  9. Tricuspid valve visualized with 2D/3D modes.  10. Moderately sized vegetation on the tricuspid valve.  11. The tricuspid valve is abnormal.  12. The aortic valve is tricuspid. Aortic valve regurgitation is not  visualized.  13. The pulmonic valve was grossly normal. Pulmonic valve regurgitation is  trivial.  14. Findings consistent with Tricuspid valve endocarditis.   TRANSTHORACIC ECHOCARDIOGRAM (08/27/2019) IMPRESSIONS    1. Left ventricular ejection fraction, by visual estimation, is 60 to  65%. Left ventricular septal wall thickness was moderately increased.  Mildly increased left ventricular posterior wall thickness. There is  mildly increased left ventricular wall  thickness.  2. The left ventricle has no regional wall motion abnormalities.  3. Global right ventricle has mildly reduced systolic function.The right  ventricular  size is moderately enlarged.  4. Hypokinetic right ventricular apex.  5. Trivial mitral valve regurgitation.  6. Tricuspid valve regurgitation is mild.  7. Tricuspid valve regurgitation is mild.  8. Normal pulmonary artery systolic pressure.  9. The inferior vena cava is normal in size with greater than 50%  respiratory variability, suggesting right atrial pressure of 3 mmHg.   Antimicrobials:  Vancomycin IV (1/8>>1/10)  Flagyl IV (1/8>>1/9)  Cefepime IV (1/8>>1/10)  Cefazolin IV (1/10>>    Subjective: No concerns today  Objective: Vitals:   09/04/19 1357 09/04/19 2056 09/05/19 0457 09/05/19 1403  BP: 112/74 (!) 142/84 118/62 118/73  Pulse: 88 (!) 105 85 98  Resp: 18 16 20 18   Temp: 98.1 F (36.7 C) 98.2 F (36.8 C) 98.6 F (37 C) 98.1 F (36.7 C)  TempSrc: Oral Oral Oral Oral  SpO2: 98% 100% 97% 99%  Weight:      Height:        Intake/Output Summary (Last 24 hours) at 09/05/2019 1449 Last data filed at 09/05/2019 0900 Gross per 24 hour  Intake 1018.33 ml  Output --  Net 1018.33 ml   Filed Weights   08/12/19 0000  Weight: 103.4 kg    Examination:  General exam: Appears calm and comfortable Respiratory system: Clear to auscultation. Respiratory effort normal. Cardiovascular system: S1 & S2 heard, RRR. No murmurs, rubs, gallops or clicks. Gastrointestinal system: Abdomen is nondistended, soft and nontender. No organomegaly or masses felt. Normal bowel sounds heard. Central nervous system: Alert and oriented. No focal neurological deficits. Extremities: No edema. No calf tenderness Skin: No cyanosis. No rashes Psychiatry: Judgement and insight appear normal. Mood & affect appropriate.     Data Reviewed: I have personally reviewed following labs and imaging studies  CBC: Recent Labs  Lab 08/29/19 1913 08/31/19 0506 09/02/19 0546 09/05/19 0547  WBC 7.9 7.9 7.1 8.9  NEUTROABS 5.6  --   --   --   HGB 10.4* 9.4* 10.1* 11.6*  HCT 33.6* 29.7*  32.7* 37.6*  MCV 87.7 85.1 87.0 87.2  PLT 276 254 353 [id number]*   Basic Metabolic Panel: Recent Labs  Lab 08/31/19 0506 09/02/19 0546 09/05/19 0547  NA 137 138 136  K 3.4* 4.0 5.0  CL 102 105 102  CO2 25 24 22   GLUCOSE 132* 101* 121*  BUN 6 8 12   CREATININE 0.73 0.59* 0.67  CALCIUM 8.7* 8.7* 9.1   GFR: Estimated Creatinine Clearance: 147.9 mL/min (by C-G formula based on SCr of 0.67 mg/dL). Liver Function Tests: Recent Labs  Lab 09/05/19 0547  AST 30  ALT 30  ALKPHOS 85  BILITOT 0.9  PROT 7.7  ALBUMIN 3.1*   No results for input(s): LIPASE, AMYLASE in the last 168 hours. No results for input(s): AMMONIA in the last 168 hours. Coagulation Profile: No results for input(s): INR, PROTIME in the last 168 hours. Cardiac Enzymes: No results for input(s): CKTOTAL, CKMB, CKMBINDEX, TROPONINI in the last 168 hours. BNP (last 3 results) No results for input(s): PROBNP in the last 8760 hours. HbA1C: No results for input(s): HGBA1C in the last 72 hours. CBG: No results for input(s): GLUCAP in the last 168 hours. Lipid Profile: No results for input(s): CHOL, HDL, LDLCALC, TRIG, CHOLHDL, LDLDIRECT in the last 72 hours. Thyroid Function Tests: No results for input(s): TSH, T4TOTAL, FREET4, T3FREE, THYROIDAB in the last 72 hours. Anemia Panel: No results for input(s): VITAMINB12, FOLATE, FERRITIN, TIBC, IRON, RETICCTPCT in the last 72 hours. Sepsis Labs: No results for input(s): PROCALCITON, LATICACIDVEN in the last 168 hours.  Recent Results (from the past 240 hour(s))  Culture, blood (routine x 2)     Status: None   Collection Time: 08/29/19  7:13 PM   Specimen: BLOOD  Result Value Ref Range Status   Specimen Description   Final    BLOOD LEFT ANTECUBITAL Performed at [facility] [address] [place], [place] [address]    Special Requests   Final    BOTTLES DRAWN AEROBIC AND ANAEROBIC Blood Culture adequate volume Performed at [facility] [address] [place], [place] [address]    Culture   Final    NO GROWTH 5 DAYS Performed at [facility], [place] [address], [place], [place] [address]    Report Status 09/03/2019 FINAL  Final  Culture, blood (routine x 2)     Status: None   Collection Time: 08/29/19  7:18 PM   Specimen: BLOOD RIGHT HAND  Result Value Ref Range Status   Specimen Description   Final    BLOOD RIGHT HAND Performed at [facility] [address], [place], [place] [address]    Special Requests   Final    BOTTLES DRAWN AEROBIC ONLY Blood Culture adequate volume Performed at [facility] [address], [place], [place] [address]    Culture   Final    NO GROWTH 5 DAYS Performed at [facility], [place] [address] [place], [place] [address]    Report Status 09/03/2019 FINAL  Final         Radiology Studies: No results found.      Scheduled Meds: . enoxaparin (LOVENOX) injection  40 mg Subcutaneous Q24H  . feeding supplement (ENSURE  ENLIVE)  237 mL Oral BID BM  . haloperidol  2 mg Oral QHS  . mouth rinse  15 mL Mouth Rinse BID  . pantoprazole  40 mg Oral BID  . polyethylene glycol  17 g Oral BID  . QUEtiapine  300 mg Oral QHS  . senna-docusate  2 tablet Oral BID   Continuous Infusions: . sodium chloride Stopped (09/05/19 1103)  .  ceFAZolin (ANCEF) IV 2 g (09/05/19 0948)     LOS: 25 days     Cordelia Poche, MD [facility] 09/05/2019, 2:49 PM  If 7PM-7AM, please contact night-coverage [email or web address]

## 2019-09-06 NOTE — Progress Notes (Signed)
PROGRESS NOTE    Justin Chaney  [id number] DOB: 24-Jul-1996 DOA: 08/10/2019 PCP: Patient, No Pcp Per   Brief Narrative: Justin Chaney is a 24 y.o. 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 Patient presented secondary to fevers/cihlls/myalgias and found to have evidence of sepsis secondary to MSSA bacteremia, TV endocarditis. During treatment course, he developed multifocal pneumonia, likely secondary to septic emboli.   Assessment & Plan:   Principal Problem:   Staphylococcus aureus bacteremia Active Problems:   Schizoaffective disorder, bipolar type (HCC)   Tobacco use   SIRS (systemic inflammatory response syndrome) (HCC)   Normocytic anemia   Thrombocytopenia (HCC)   Polysubstance abuse (HCC)   IVDU (intravenous drug user)   S/P VP shunt   Transaminitis   Hyperbilirubinemia   Sepsis with acute organ dysfunction without septic shock (HCC)   Sepsis Present on admission secondary to bacteremia, endocarditis, multifocal pneumonia.  Resolved with antibiotics.  Staphylococcus aureus bacteremia Likely secondary to IV drug use.  Patient has a history of injecting methamphetamine.  Staph aureus is methicillin sensitive.  Patient initially empirically treated with vancomycin, Flagyl, cefepime and was transitioned to ceftriaxone IV for continued treatment of bacteremia/endocarditis.  Plan is for 6 weeks of total antibiotics with a 4-week plan for IV cefazolin transition to oritavancin versus oral antibiotics per ID recommendations.  4-week course of cefazolin will last through until February 8.  Repeat blood cultures (08/29/2019) significant for no growth. -Continue cefazolin 2 g IV every 8 hours  Tricuspid valve endocarditis Pulmonary septic emboli/pneumonia Agitation seen on transesophageal echocardiogram from 08/14/2019.  CT chest on 08/29/2019 significant for multifocal pneumonia with associated right pleural effusion  and compressive atelectasis of the right base.  Patient being treated as mentioned above.  Patient currently with no associated symptoms.  Currently on room air. -Will obtain a 2-view chest x-ray in AM  Pleuritic chest pain Loculated secondary to pneumonia as mentioned above.  CTA negative for acute PE.  Improved.  Hematemesis Noted on chart review.  GI consulted and patient declined upper GI evaluation.  Patient placed on Protonix twice daily.  Anemia Likely anemia related to severe illness. Patient also had an episode of hematemesis. Patient declined EGD. Hemoglobin is stable. Anemia panel suggests chronic disease.  Constipation Improved with magnesium citrate.  Thrombocytopenia Likely secondary to sepsis. Resolved.  IV drug abuse Patient has a history of amphetamine use  Schizophrenia Bipolar disorder -Continue Seroquel and Haldol  Fall Occurred during hospitalization. Patient declined PT evaluation. No recurrent falls with ambulation. Per chart review, possibly related to orthostatic hypotension.   DVT prophylaxis: Lovenox Code Status:   Code Status: Full Code Family Communication: None at bedside Disposition Plan: Discharge home pending completion of IV antibiotics   Consultants:   Infectious disease  Procedures:   TRANSTHORACIC ECHOCARDIOGRAM (08/12/2019) IMPRESSIONS    1. Left ventricular ejection fraction, by visual estimation, is 60 to  65%. The left ventricle has normal function. There is no left ventricular  hypertrophy.  2. Global right ventricle has normal systolic function.The right  ventricular size is normal.  3. Left atrial size was normal.  4. Right atrial size was normal.  5. The mitral valve is normal in structure. Trivial mitral valve  regurgitation. No evidence of mitral stenosis.  6. The tricuspid valve is normal in structure.  7. The aortic valve is tricuspid. Aortic valve regurgitation is not  visualized. No evidence of aortic  valve sclerosis or stenosis.  8. The  pulmonic valve was not well visualized. Pulmonic valve  regurgitation is not visualized.  9. The inferior vena cava is normal in size with greater than 50%  respiratory variability, suggesting right atrial pressure of 3 mmHg.  10. Normal LV function; no obvious vegetations.  11. The left ventricle has no regional wall motion abnormalities.   TRANSESOPHAGEAL ECHOCARDIOGRAM (08/14/2019) IMPRESSIONS    1. Left ventricular ejection fraction, by visual estimation, is 60 to  65%. The left ventricle has normal function. There is no left ventricular  hypertrophy.  2. The left ventricle has no regional wall motion abnormalities.  3. Global right ventricle has normal systolic function.The right  ventricular size is normal. No increase in right ventricular wall  thickness.  4. Left atrial size was normal.  5. Right atrial size was normal.  6. The pericardial effusion is circumferential.  7. Trivial pericardial effusion is present.  8. The mitral valve is grossly normal. Trivial mitral valve  regurgitation.  9. Tricuspid valve visualized with 2D/3D modes.  10. Moderately sized vegetation on the tricuspid valve.  11. The tricuspid valve is abnormal.  12. The aortic valve is tricuspid. Aortic valve regurgitation is not  visualized.  13. The pulmonic valve was grossly normal. Pulmonic valve regurgitation is  trivial.  14. Findings consistent with Tricuspid valve endocarditis.   TRANSTHORACIC ECHOCARDIOGRAM (08/27/2019) IMPRESSIONS    1. Left ventricular ejection fraction, by visual estimation, is 60 to  65%. Left ventricular septal wall thickness was moderately increased.  Mildly increased left ventricular posterior wall thickness. There is  mildly increased left ventricular wall  thickness.  2. The left ventricle has no regional wall motion abnormalities.  3. Global right ventricle has mildly reduced systolic function.The right    ventricular size is moderately enlarged.  4. Hypokinetic right ventricular apex.  5. Trivial mitral valve regurgitation.  6. Tricuspid valve regurgitation is mild.  7. Tricuspid valve regurgitation is mild.  8. Normal pulmonary artery systolic pressure.  9. The inferior vena cava is normal in size with greater than 50%  respiratory variability, suggesting right atrial pressure of 3 mmHg.   Antimicrobials:  Vancomycin IV (1/8>>1/10)  Flagyl IV (1/8>>1/9)  Cefepime IV (1/8>>1/10)  Cefazolin IV (1/10>>    Subjective: No issues  Objective: Vitals:   09/05/19 1403 09/05/19 2014 09/06/19 0528 09/06/19 1327  BP: 118/73 (!) 147/62 118/83 (!) 114/54  Pulse: 98 (!) 108 65 (!) 103  Resp: 18 18 19 18   Temp: 98.1 F (36.7 C) 98.3 F (36.8 C) 98.5 F (36.9 C) 98.2 F (36.8 C)  TempSrc: Oral Oral Oral Oral  SpO2: 99% 97% 100% 96%  Weight:      Height:        Intake/Output Summary (Last 24 hours) at 09/06/2019 1640 Last data filed at 09/05/2019 1700 Gross per 24 hour  Intake 240 ml  Output --  Net 240 ml   Filed Weights   08/12/19 0000  Weight: 103.4 kg    Examination:  General: Well appearing, no distress    Data Reviewed: I have personally reviewed following labs and imaging studies  CBC: Recent Labs  Lab 08/31/19 0506 09/02/19 0546 09/05/19 0547  WBC 7.9 7.1 8.9  HGB 9.4* 10.1* 11.6*  HCT 29.7* 32.7* 37.6*  MCV 85.1 87.0 87.2  PLT 254 353 [id number]*   Basic Metabolic Panel: Recent Labs  Lab 08/31/19 0506 09/02/19 0546 09/05/19 0547  NA 137 138 136  K 3.4* 4.0 5.0  CL 102 105 102  CO2  25 24 22   GLUCOSE 132* 101* 121*  BUN 6 8 12   CREATININE 0.73 0.59* 0.67  CALCIUM 8.7* 8.7* 9.1   GFR: Estimated Creatinine Clearance: 147.9 mL/min (by C-G formula based on SCr of 0.67 mg/dL). Liver Function Tests: Recent Labs  Lab 09/05/19 0547  AST 30  ALT 30  ALKPHOS 85  BILITOT 0.9  PROT 7.7  ALBUMIN 3.1*   No results for input(s): LIPASE, AMYLASE  in the last 168 hours. No results for input(s): AMMONIA in the last 168 hours. Coagulation Profile: No results for input(s): INR, PROTIME in the last 168 hours. Cardiac Enzymes: No results for input(s): CKTOTAL, CKMB, CKMBINDEX, TROPONINI in the last 168 hours. BNP (last 3 results) No results for input(s): PROBNP in the last 8760 hours. HbA1C: No results for input(s): HGBA1C in the last 72 hours. CBG: No results for input(s): GLUCAP in the last 168 hours. Lipid Profile: No results for input(s): CHOL, HDL, LDLCALC, TRIG, CHOLHDL, LDLDIRECT in the last 72 hours. Thyroid Function Tests: No results for input(s): TSH, T4TOTAL, FREET4, T3FREE, THYROIDAB in the last 72 hours. Anemia Panel: No results for input(s): VITAMINB12, FOLATE, FERRITIN, TIBC, IRON, RETICCTPCT in the last 72 hours. Sepsis Labs: No results for input(s): PROCALCITON, LATICACIDVEN in the last 168 hours.  Recent Results (from the past 240 hour(s))  Culture, blood (routine x 2)     Status: None   Collection Time: 08/29/19  7:13 PM   Specimen: BLOOD  Result Value Ref Range Status   Specimen Description   Final    BLOOD LEFT ANTECUBITAL Performed at [facility] [address], [place], [place] [address]    Special Requests   Final    BOTTLES DRAWN AEROBIC AND ANAEROBIC Blood Culture adequate volume Performed at [facility] [address] [place], [place] [address]    Culture   Final    NO GROWTH 5 DAYS Performed at [facility], [place] [address], [place], [place] [address]    Report Status 09/03/2019 FINAL  Final  Culture, blood (routine x 2)     Status: None   Collection Time: 08/29/19  7:18 PM   Specimen: BLOOD RIGHT HAND  Result Value Ref Range Status   Specimen Description   Final    BLOOD RIGHT HAND Performed at [facility] [address] [place], [name] [address]    Special Requests   Final    BOTTLES DRAWN AEROBIC ONLY Blood Culture  adequate volume Performed at [facility] [address], [place], [place] [address]    Culture   Final    NO GROWTH 5 DAYS Performed at [facility], [place] [address] [place],  [address]    Report Status 09/03/2019 FINAL  Final         Radiology Studies: No results found.      Scheduled Meds: . enoxaparin (LOVENOX) injection  40 mg Subcutaneous Q24H  . feeding supplement (ENSURE ENLIVE)  237 mL Oral BID BM  . haloperidol  2 mg Oral QHS  . mouth rinse  15 mL Mouth Rinse BID  . pantoprazole  40 mg Oral BID  . polyethylene glycol  17 g Oral BID  . QUEtiapine  300 mg Oral QHS  . senna-docusate  2 tablet Oral BID   Continuous Infusions: . sodium chloride Stopped (09/05/19 1103)  .  ceFAZolin (ANCEF) IV 2 g (09/06/19 0950)     LOS: 26 days  Cordelia Poche, MD [facility] 09/06/2019, 4:40 PM  If 7PM-7AM, please contact night-coverage [email or web address]

## 2019-09-07 ENCOUNTER — Inpatient Hospital Stay (HOSPITAL_COMMUNITY): Payer: Medicaid Other

## 2019-09-07 LAB — BASIC METABOLIC PANEL
Anion gap: 9 (ref 5–15)
BUN: 12 mg/dL (ref 6–20)
CO2: 25 mmol/L (ref 22–32)
Calcium: 9.4 mg/dL (ref 8.9–10.3)
Chloride: 104 mmol/L (ref 98–111)
Creatinine, Ser: 0.71 mg/dL (ref 0.61–1.24)
GFR calc Af Amer: >60 mL/min (ref >60)
GFR calc non Af Amer: >60 mL/min (ref >60)
Glucose, Bld: 98 mg/dL (ref 70–99)
Potassium: 4.2 mmol/L (ref 3.5–5.1)
Sodium: 138 mmol/L (ref 135–145)

## 2019-09-07 LAB — CBC
HCT: 38.1 % — ABNORMAL LOW (ref 39.0–52.0)
Hemoglobin: 11.6 g/dL — ABNORMAL LOW (ref 13.0–17.0)
MCH: 26.4 pg (ref 26.0–34.0)
MCHC: 30.4 g/dL (ref 30.0–36.0)
MCV: 86.8 fL (ref 80.0–100.0)
Platelets: 519 10*3/uL — ABNORMAL HIGH (ref 150–400)
RBC: 4.39 MIL/uL (ref 4.22–5.81)
RDW: 14.7 % (ref 11.5–15.5)
WBC: 7.6 10*3/uL (ref 4.0–10.5)
nRBC: 0 % (ref 0.0–0.2)

## 2019-09-07 NOTE — Progress Notes (Signed)
PROGRESS NOTE    Justin Chaney  [id number] DOB: [birth year] DOA: 08/10/2019 PCP: Patient, No Pcp Per   Brief Narrative: Justin Chaney is a 24 y.o. 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 Patient presented secondary to fevers/cihlls/myalgias and found to have evidence of sepsis secondary to MSSA bacteremia, TV endocarditis. During treatment course, he developed multifocal pneumonia, likely secondary to septic emboli.   Assessment & Plan:   Principal Problem:   Staphylococcus aureus bacteremia Active Problems:   Schizoaffective disorder, bipolar type (HCC)   Tobacco use   SIRS (systemic inflammatory response syndrome) (HCC)   Normocytic anemia   Thrombocytopenia (HCC)   Polysubstance abuse (HCC)   IVDU (intravenous drug user)   S/P VP shunt   Transaminitis   Hyperbilirubinemia   Sepsis with acute organ dysfunction without septic shock (HCC)   Sepsis Present on admission secondary to bacteremia, endocarditis, multifocal pneumonia.  Resolved with antibiotics.  Staphylococcus aureus bacteremia Likely secondary to IV drug use.  Patient has a history of injecting methamphetamine.  Staph aureus is methicillin sensitive.  Patient initially empirically treated with vancomycin, Flagyl, cefepime and was transitioned to ceftriaxone IV for continued treatment of bacteremia/endocarditis.  Plan is for 6 weeks of total antibiotics with a 4-week plan for IV cefazolin transition to oritavancin versus oral antibiotics per ID recommendations.  4-week course of cefazolin will last through until February 8.  Repeat blood cultures (08/29/2019) significant for no growth. -Continue cefazolin 2 g IV every 8 hours  Tricuspid valve endocarditis Pulmonary septic emboli/pneumonia Agitation seen on transesophageal echocardiogram from 08/14/2019.  CT chest on 08/29/2019 significant for multifocal pneumonia with associated right pleural effusion  and compressive atelectasis of the right base.  Patient being treated as mentioned above.  Patient currently with no associated symptoms.  Currently on room air. Chest x-ray improved. Repeat chest x-ray as an outpatient in about 4 weeks.  Pleuritic chest pain Loculated secondary to pneumonia as mentioned above.  CTA negative for acute PE.  Improved.  Hematemesis Noted on chart review.  GI consulted and patient declined upper GI evaluation.  Patient placed on Protonix twice daily.  Anemia Likely anemia related to severe illness. Patient also had an episode of hematemesis. Patient declined EGD. Hemoglobin is stable. Anemia panel suggests chronic disease.  Constipation Improved with magnesium citrate.  Thrombocytopenia Likely secondary to sepsis. Resolved.  IV drug abuse Patient has a history of amphetamine use  Schizophrenia Bipolar disorder Patient has been declining medication as he was trying to get himself off. He was also under the impression that his medication was for sleep. After discussion of benefits and risks, patient now agreeable to taking his medicaiton -Continue Seroquel and Haldol  Fall Occurred during hospitalization. Patient declined PT evaluation. No recurrent falls with ambulation. Per chart review, possibly related to orthostatic hypotension.   DVT prophylaxis: Lovenox Code Status:   Code Status: Full Code Family Communication: None at bedside Disposition Plan: Discharge home pending completion of IV antibiotics   Consultants:   Infectious disease  Procedures:   TRANSTHORACIC ECHOCARDIOGRAM (08/12/2019) IMPRESSIONS    1. Left ventricular ejection fraction, by visual estimation, is 60 to  65%. The left ventricle has normal function. There is no left ventricular  hypertrophy.  2. Global right ventricle has normal systolic function.The right  ventricular size is normal.  3. Left atrial size was normal.  4. Right atrial size was normal.  5. The  mitral valve is normal in structure. Trivial  mitral valve  regurgitation. No evidence of mitral stenosis.  6. The tricuspid valve is normal in structure.  7. The aortic valve is tricuspid. Aortic valve regurgitation is not  visualized. No evidence of aortic valve sclerosis or stenosis.  8. The pulmonic valve was not well visualized. Pulmonic valve  regurgitation is not visualized.  9. The inferior vena cava is normal in size with greater than 50%  respiratory variability, suggesting right atrial pressure of 3 mmHg.  10. Normal LV function; no obvious vegetations.  11. The left ventricle has no regional wall motion abnormalities.   TRANSESOPHAGEAL ECHOCARDIOGRAM (08/14/2019) IMPRESSIONS    1. Left ventricular ejection fraction, by visual estimation, is 60 to  65%. The left ventricle has normal function. There is no left ventricular  hypertrophy.  2. The left ventricle has no regional wall motion abnormalities.  3. Global right ventricle has normal systolic function.The right  ventricular size is normal. No increase in right ventricular wall  thickness.  4. Left atrial size was normal.  5. Right atrial size was normal.  6. The pericardial effusion is circumferential.  7. Trivial pericardial effusion is present.  8. The mitral valve is grossly normal. Trivial mitral valve  regurgitation.  9. Tricuspid valve visualized with 2D/3D modes.  10. Moderately sized vegetation on the tricuspid valve.  11. The tricuspid valve is abnormal.  12. The aortic valve is tricuspid. Aortic valve regurgitation is not  visualized.  13. The pulmonic valve was grossly normal. Pulmonic valve regurgitation is  trivial.  14. Findings consistent with Tricuspid valve endocarditis.   TRANSTHORACIC ECHOCARDIOGRAM (08/27/2019) IMPRESSIONS    1. Left ventricular ejection fraction, by visual estimation, is 60 to  65%. Left ventricular septal wall thickness was moderately increased.  Mildly  increased left ventricular posterior wall thickness. There is  mildly increased left ventricular wall  thickness.  2. The left ventricle has no regional wall motion abnormalities.  3. Global right ventricle has mildly reduced systolic function.The right  ventricular size is moderately enlarged.  4. Hypokinetic right ventricular apex.  5. Trivial mitral valve regurgitation.  6. Tricuspid valve regurgitation is mild.  7. Tricuspid valve regurgitation is mild.  8. Normal pulmonary artery systolic pressure.  9. The inferior vena cava is normal in size with greater than 50%  respiratory variability, suggesting right atrial pressure of 3 mmHg.   Antimicrobials:  Vancomycin IV (1/8>>1/10)  Flagyl IV (1/8>>1/9)  Cefepime IV (1/8>>1/10)  Cefazolin IV (1/10>>    Subjective: No concerns overnight  Objective: Vitals:   09/06/19 0528 09/06/19 1327 09/06/19 2104 09/07/19 0519  BP: 118/83 (!) 114/54 117/78 116/68  Pulse: 65 (!) 103 90 89  Resp: 19 18 16 17   Temp: 98.5 F (36.9 C) 98.2 F (36.8 C) 98.6 F (37 C) 98.4 F (36.9 C)  TempSrc: Oral Oral    SpO2: 100% 96% 98% 97%  Weight:      Height:       No intake or output data in the 24 hours ending 09/07/19 0914 Filed Weights   08/12/19 0000  Weight: 103.4 kg    Examination:  General exam: Appears calm and comfortable Respiratory system: Clear to auscultation. Respiratory effort normal. Cardiovascular system: S1 & S2 heard, Tachycardic, normal rhythm. No murmurs, rubs, gallops or clicks. Gastrointestinal system: Abdomen is nondistended, soft and nontender. No organomegaly or masses felt. Normal bowel sounds heard. Central nervous system: Alert and oriented. No focal neurological deficits. Extremities: No edema. No calf tenderness Skin: No cyanosis. No rashes  Psychiatry: Judgement and insight appear normal. Mood & affect appropriate.     Data Reviewed: I have personally reviewed following labs and imaging  studies  CBC: Recent Labs  Lab 09/02/19 0546 09/05/19 0547 09/07/19 0454  WBC 7.1 8.9 7.6  HGB 10.1* 11.6* 11.6*  HCT 32.7* 37.6* 38.1*  MCV 87.0 87.2 86.8  PLT 353 419* [id number]*   Basic Metabolic Panel: Recent Labs  Lab 09/02/19 0546 09/05/19 0547 09/07/19 0454  NA 138 136 138  K 4.0 5.0 4.2  CL 105 102 104  CO2 24 22 25   GLUCOSE 101* 121* 98  BUN 8 12 12   CREATININE 0.59* 0.67 0.71  CALCIUM 8.7* 9.1 9.4   GFR: Estimated Creatinine Clearance: 147.9 mL/min (by C-G formula based on SCr of 0.71 mg/dL). Liver Function Tests: Recent Labs  Lab 09/05/19 0547  AST 30  ALT 30  ALKPHOS 85  BILITOT 0.9  PROT 7.7  ALBUMIN 3.1*   No results for input(s): LIPASE, AMYLASE in the last 168 hours. No results for input(s): AMMONIA in the last 168 hours. Coagulation Profile: No results for input(s): INR, PROTIME in the last 168 hours. Cardiac Enzymes: No results for input(s): CKTOTAL, CKMB, CKMBINDEX, TROPONINI in the last 168 hours. BNP (last 3 results) No results for input(s): PROBNP in the last 8760 hours. HbA1C: No results for input(s): HGBA1C in the last 72 hours. CBG: No results for input(s): GLUCAP in the last 168 hours. Lipid Profile: No results for input(s): CHOL, HDL, LDLCALC, TRIG, CHOLHDL, LDLDIRECT in the last 72 hours. Thyroid Function Tests: No results for input(s): TSH, T4TOTAL, FREET4, T3FREE, THYROIDAB in the last 72 hours. Anemia Panel: No results for input(s): VITAMINB12, FOLATE, FERRITIN, TIBC, IRON, RETICCTPCT in the last 72 hours. Sepsis Labs: No results for input(s): PROCALCITON, LATICACIDVEN in the last 168 hours.  Recent Results (from the past 240 hour(s))  Culture, blood (routine x 2)     Status: None   Collection Time: 08/29/19  7:13 PM   Specimen: BLOOD  Result Value Ref Range Status   Specimen Description   Final    BLOOD LEFT ANTECUBITAL Performed at [facility] [address] [place], [place] [address]    Special  Requests   Final    BOTTLES DRAWN AEROBIC AND ANAEROBIC Blood Culture adequate volume Performed at [facility] [address], [place], [place] [address]    Culture   Final    NO GROWTH 5 DAYS Performed at [facility], [place] [address], [place], [place] [address]    Report Status 09/03/2019 FINAL  Final  Culture, blood (routine x 2)     Status: None   Collection Time: 08/29/19  7:18 PM   Specimen: BLOOD RIGHT HAND  Result Value Ref Range Status   Specimen Description   Final    BLOOD RIGHT HAND Performed at [facility] [address] [name], [place] [address]    Special Requests   Final    BOTTLES DRAWN AEROBIC ONLY Blood Culture adequate volume Performed at [facility] [address], [place], [place] [address]    Culture   Final    NO GROWTH 5 DAYS Performed at [facility], [place] [address], [place], [place] [address]    Report Status 09/03/2019 FINAL  Final         Radiology Studies: DG Chest 2 View  Result Date: 09/07/2019 CLINICAL DATA:  Recent diagnosis of pneumonia, resolution of  symptoms, previous tobacco abuse EXAM: CHEST - 2 VIEW COMPARISON:  08/25/2019 FINDINGS: Frontal and lateral views of the chest demonstrate persistent but improving right upper lobe airspace disease. No effusion or pneumothorax. Cardiac silhouette is unremarkable. No acute bony abnormalities. IMPRESSION: 1. Persistent but improving right upper lobe airspace disease. Please note that it may take 6-8 weeks for complete radiographic resolution of pneumonia after appropriate antibiotic therapy. Electronically Signed   By: Randa Ngo M.D.   On: 09/07/2019 08:49        Scheduled Meds: . enoxaparin (LOVENOX) injection  40 mg Subcutaneous Q24H  . feeding supplement (ENSURE ENLIVE)  237 mL Oral BID BM  . haloperidol  2 mg Oral QHS  . mouth rinse  15 mL Mouth Rinse BID  . pantoprazole  40 mg Oral BID  . polyethylene  glycol  17 g Oral BID  . QUEtiapine  300 mg Oral QHS  . senna-docusate  2 tablet Oral BID   Continuous Infusions: . sodium chloride Stopped (09/05/19 1103)  .  ceFAZolin (ANCEF) IV 2 g (09/07/19 0215)     LOS: 27 days     Cordelia Poche, MD [facility] 09/07/2019, 9:14 AM  If 7PM-7AM, please contact night-coverage [email or web address]

## 2019-09-08 NOTE — Progress Notes (Signed)
PROGRESS NOTE    Justin Chaney  [id number] DOB: 07-Aug-1995 DOA: 08/10/2019 PCP: Patient, No Pcp Per   Brief Narrative: Justin Chaney is a 24 y.o. 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 Patient presented secondary to fevers/cihlls/myalgias and found to have evidence of sepsis secondary to MSSA bacteremia, TV endocarditis. During treatment course, he developed multifocal pneumonia, likely secondary to septic emboli.   Assessment & Plan:   Principal Problem:   Staphylococcus aureus bacteremia Active Problems:   Schizoaffective disorder, bipolar type (HCC)   Tobacco use   SIRS (systemic inflammatory response syndrome) (HCC)   Normocytic anemia   Thrombocytopenia (HCC)   Polysubstance abuse (HCC)   IVDU (intravenous drug user)   S/P VP shunt   Transaminitis   Hyperbilirubinemia   Sepsis with acute organ dysfunction without septic shock (HCC)   Sepsis Present on admission secondary to bacteremia, endocarditis, multifocal pneumonia.  Resolved with antibiotics.  Staphylococcus aureus bacteremia Likely secondary to IV drug use.  Patient has a history of injecting methamphetamine.  Staph aureus is methicillin sensitive.  Patient initially empirically treated with vancomycin, Flagyl, cefepime and was transitioned to ceftriaxone IV for continued treatment of bacteremia/endocarditis.  Plan is for 6 weeks of total antibiotics with a 4-week plan for IV cefazolin transition to oritavancin versus oral antibiotics per ID recommendations.  4-week course of cefazolin will last through until February 8.  Repeat blood cultures (08/29/2019) significant for no growth. -Continue cefazolin 2 g IV every 8 hours  Tricuspid valve endocarditis Pulmonary septic emboli/pneumonia Agitation seen on transesophageal echocardiogram from 08/14/2019.  CT chest on 08/29/2019 significant for multifocal pneumonia with associated right pleural effusion  and compressive atelectasis of the right base.  Patient being treated as mentioned above.  Patient currently with no associated symptoms.  Currently on room air. Chest x-ray improved. Repeat chest x-ray as an outpatient in about 4 weeks.  Pleuritic chest pain Loculated secondary to pneumonia as mentioned above.  CTA negative for acute PE.  Improved.  Hematemesis Noted on chart review.  GI consulted and patient declined upper GI evaluation.  Patient placed on Protonix twice daily.  Anemia Likely anemia related to severe illness. Patient also had an episode of hematemesis. Patient declined EGD. Hemoglobin is stable. Anemia panel suggests chronic disease.  Constipation Improved with magnesium citrate.  Thrombocytopenia Likely secondary to sepsis. Resolved.  IV drug abuse Patient has a history of amphetamine use  Schizophrenia Bipolar disorder Patient has been declining medication as he was trying to get himself off. He was also under the impression that his medication was for sleep. After discussion of benefits and risks, patient now agreeable to taking his medicaiton -Continue Seroquel and Haldol  Fall Occurred during hospitalization. Patient declined PT evaluation. No recurrent falls with ambulation. Per chart review, possibly related to orthostatic hypotension.   DVT prophylaxis: Lovenox Code Status:   Code Status: Full Code Family Communication: None at bedside Disposition Plan: Discharge home pending completion of IV antibiotics   Consultants:   Infectious disease  Procedures:   TRANSTHORACIC ECHOCARDIOGRAM (08/12/2019) IMPRESSIONS    1. Left ventricular ejection fraction, by visual estimation, is 60 to  65%. The left ventricle has normal function. There is no left ventricular  hypertrophy.  2. Global right ventricle has normal systolic function.The right  ventricular size is normal.  3. Left atrial size was normal.  4. Right atrial size was normal.  5. The  mitral valve is normal in structure. Trivial  mitral valve  regurgitation. No evidence of mitral stenosis.  6. The tricuspid valve is normal in structure.  7. The aortic valve is tricuspid. Aortic valve regurgitation is not  visualized. No evidence of aortic valve sclerosis or stenosis.  8. The pulmonic valve was not well visualized. Pulmonic valve  regurgitation is not visualized.  9. The inferior vena cava is normal in size with greater than 50%  respiratory variability, suggesting right atrial pressure of 3 mmHg.  10. Normal LV function; no obvious vegetations.  11. The left ventricle has no regional wall motion abnormalities.   TRANSESOPHAGEAL ECHOCARDIOGRAM (08/14/2019) IMPRESSIONS    1. Left ventricular ejection fraction, by visual estimation, is 60 to  65%. The left ventricle has normal function. There is no left ventricular  hypertrophy.  2. The left ventricle has no regional wall motion abnormalities.  3. Global right ventricle has normal systolic function.The right  ventricular size is normal. No increase in right ventricular wall  thickness.  4. Left atrial size was normal.  5. Right atrial size was normal.  6. The pericardial effusion is circumferential.  7. Trivial pericardial effusion is present.  8. The mitral valve is grossly normal. Trivial mitral valve  regurgitation.  9. Tricuspid valve visualized with 2D/3D modes.  10. Moderately sized vegetation on the tricuspid valve.  11. The tricuspid valve is abnormal.  12. The aortic valve is tricuspid. Aortic valve regurgitation is not  visualized.  13. The pulmonic valve was grossly normal. Pulmonic valve regurgitation is  trivial.  14. Findings consistent with Tricuspid valve endocarditis.   TRANSTHORACIC ECHOCARDIOGRAM (08/27/2019) IMPRESSIONS    1. Left ventricular ejection fraction, by visual estimation, is 60 to  65%. Left ventricular septal wall thickness was moderately increased.  Mildly  increased left ventricular posterior wall thickness. There is  mildly increased left ventricular wall  thickness.  2. The left ventricle has no regional wall motion abnormalities.  3. Global right ventricle has mildly reduced systolic function.The right  ventricular size is moderately enlarged.  4. Hypokinetic right ventricular apex.  5. Trivial mitral valve regurgitation.  6. Tricuspid valve regurgitation is mild.  7. Tricuspid valve regurgitation is mild.  8. Normal pulmonary artery systolic pressure.  9. The inferior vena cava is normal in size with greater than 50%  respiratory variability, suggesting right atrial pressure of 3 mmHg.   Antimicrobials:  Vancomycin IV (1/8>>1/10)  Flagyl IV (1/8>>1/9)  Cefepime IV (1/8>>1/10)  Cefazolin IV (1/10>>    Subjective: Patient requested IV to be removed because of discomfort. Initially declined for me to evaluate him this morning.  Objective: Vitals:   09/06/19 1327 09/06/19 2104 09/07/19 0519 09/08/19 0116  BP:   116/68 128/64  Pulse: (!) 103 90 89 89  Resp: 18 16 17 20   Temp:   98.4 F (36.9 C) 98.3 F (36.8 C)  TempSrc: Oral     SpO2:   97% 100%  Weight:      Height:        Intake/Output Summary (Last 24 hours) at 09/08/2019 1231 Last data filed at 09/07/2019 1451 Gross per 24 hour  Intake 590 ml  Output 550 ml  Net 40 ml   Filed Weights   08/12/19 0000  Weight: 103.4 kg    Examination:  General exam: Appears calm and comfortable Respiratory system: Clear to auscultation. Respiratory effort normal. Cardiovascular system: S1 & S2 heard, normal rate, regular rhyhtm. Gastrointestinal system: Abdomen is nondistended, soft and nontender. No organomegaly or masses felt. Normal  bowel sounds heard. Central nervous system: Alert and oriented. No focal neurological deficits. Extremities: No edema. No calf tenderness Skin: No cyanosis. No rashes Psychiatry: Judgement and insight appear normal. Mood & affect  appropriate.      Data Reviewed: I have personally reviewed following labs and imaging studies  CBC: Recent Labs  Lab 09/02/19 0546 09/05/19 0547 09/07/19 0454  WBC 7.1 8.9 7.6  HGB 10.1* 11.6* 11.6*  HCT 32.7* 37.6* 38.1*  MCV 87.0 87.2 86.8  PLT 353 419* [id number]*   Basic Metabolic Panel: Recent Labs  Lab 09/02/19 0546 09/05/19 0547 09/07/19 0454  NA 138 136 138  K 4.0 5.0 4.2  CL 105 102 104  CO2 24 22 25   GLUCOSE 101* 121* 98  BUN 8 12 12   CREATININE 0.59* 0.67 0.71  CALCIUM 8.7* 9.1 9.4   GFR: Estimated Creatinine Clearance: 147.9 mL/min (by C-G formula based on SCr of 0.71 mg/dL). Liver Function Tests: Recent Labs  Lab 09/05/19 0547  AST 30  ALT 30  ALKPHOS 85  BILITOT 0.9  PROT 7.7  ALBUMIN 3.1*   No results for input(s): LIPASE, AMYLASE in the last 168 hours. No results for input(s): AMMONIA in the last 168 hours. Coagulation Profile: No results for input(s): INR, PROTIME in the last 168 hours. Cardiac Enzymes: No results for input(s): CKTOTAL, CKMB, CKMBINDEX, TROPONINI in the last 168 hours. BNP (last 3 results) No results for input(s): PROBNP in the last 8760 hours. HbA1C: No results for input(s): HGBA1C in the last 72 hours. CBG: No results for input(s): GLUCAP in the last 168 hours. Lipid Profile: No results for input(s): CHOL, HDL, LDLCALC, TRIG, CHOLHDL, LDLDIRECT in the last 72 hours. Thyroid Function Tests: No results for input(s): TSH, T4TOTAL, FREET4, T3FREE, THYROIDAB in the last 72 hours. Anemia Panel: No results for input(s): VITAMINB12, FOLATE, FERRITIN, TIBC, IRON, RETICCTPCT in the last 72 hours. Sepsis Labs: No results for input(s): PROCALCITON, LATICACIDVEN in the last 168 hours.  Recent Results (from the past 240 hour(s))  Culture, blood (routine x 2)     Status: None   Collection Time: 08/29/19  7:13 PM   Specimen: BLOOD  Result Value Ref Range Status   Specimen Description   Final    BLOOD LEFT ANTECUBITAL Performed at  [facility] [address], [place], [place] [address]    Special Requests   Final    BOTTLES DRAWN AEROBIC AND ANAEROBIC Blood Culture adequate volume Performed at [facility] [address] [place], [place] [address]    Culture   Final    NO GROWTH 5 DAYS Performed at [facility], [place] [address], [place], [place] [address]    Report Status 09/03/2019 FINAL  Final  Culture, blood (routine x 2)     Status: None   Collection Time: 08/29/19  7:18 PM   Specimen: BLOOD RIGHT HAND  Result Value Ref Range Status   Specimen Description   Final    BLOOD RIGHT HAND Performed at [facility] [address] [place], [place] [address]    Special Requests   Final    BOTTLES DRAWN AEROBIC ONLY Blood Culture adequate volume Performed at [facility] [address], [place], [place] [address]    Culture   Final    NO GROWTH 5 DAYS Performed at [facility], [place] [address], [place], [place] [address]    Report Status 09/03/2019 FINAL  Final  Radiology Studies: DG Chest 2 View  Result Date: 09/07/2019 CLINICAL DATA:  Recent diagnosis of pneumonia, resolution of symptoms, previous tobacco abuse EXAM: CHEST - 2 VIEW COMPARISON:  08/25/2019 FINDINGS: Frontal and lateral views of the chest demonstrate persistent but improving right upper lobe airspace disease. No effusion or pneumothorax. Cardiac silhouette is unremarkable. No acute bony abnormalities. IMPRESSION: 1. Persistent but improving right upper lobe airspace disease. Please note that it may take 6-8 weeks for complete radiographic resolution of pneumonia after appropriate antibiotic therapy. Electronically Signed   By: Randa Ngo M.D.   On: 09/07/2019 08:49        Scheduled Meds: . enoxaparin (LOVENOX) injection  40 mg Subcutaneous Q24H  . feeding supplement (ENSURE ENLIVE)  237 mL Oral BID BM  . haloperidol  2 mg Oral QHS  .  mouth rinse  15 mL Mouth Rinse BID  . pantoprazole  40 mg Oral BID  . polyethylene glycol  17 g Oral BID  . QUEtiapine  300 mg Oral QHS  . senna-docusate  2 tablet Oral BID   Continuous Infusions: . sodium chloride Stopped (09/05/19 1103)  .  ceFAZolin (ANCEF) IV 2 g (09/08/19 1132)     LOS: 28 days     Cordelia Poche, MD [facility] 09/08/2019, 12:31 PM  If 7PM-7AM, please contact night-coverage [email or web address]

## 2019-09-08 NOTE — Progress Notes (Signed)
pts IV was removed after this mornings dose of Ancef. Pt had continually demanded IV be removed d/t discomfort of placement and inability to bend his arm comfortably

## 2019-09-08 NOTE — Progress Notes (Signed)
Pt had a new IV placed at 2300 by IV team using doppler in his rt AC. Pt is not happy with the location, cant bend his arm, attempts to pad the area with gauze and re-adjust the tape does not seem to help . Pt has requested multiple times to have the IV removed.

## 2019-09-09 NOTE — Progress Notes (Signed)
PROGRESS NOTE    Justin Chaney  [id number] DOB: 1996/04/21 DOA: 08/10/2019 PCP: Patient, No Pcp Per   Brief Narrative: Justin Chaney is a 24 y.o. 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 Patient presented secondary to fevers/cihlls/myalgias and found to have evidence of sepsis secondary to MSSA bacteremia, TV endocarditis. During treatment course, he developed multifocal pneumonia, likely secondary to septic emboli.   Assessment & Plan:   Principal Problem:   Staphylococcus aureus bacteremia Active Problems:   Schizoaffective disorder, bipolar type (HCC)   Tobacco use   SIRS (systemic inflammatory response syndrome) (HCC)   Normocytic anemia   Thrombocytopenia (HCC)   Polysubstance abuse (HCC)   IVDU (intravenous drug user)   S/P VP shunt   Transaminitis   Hyperbilirubinemia   Sepsis with acute organ dysfunction without septic shock (HCC)   Sepsis Present on admission secondary to bacteremia, endocarditis, multifocal pneumonia.  Resolved with antibiotics.  Staphylococcus aureus bacteremia Likely secondary to IV drug use.  Patient has a history of injecting methamphetamine.  Staph aureus is methicillin sensitive.  Patient initially empirically treated with vancomycin, Flagyl, cefepime and was transitioned to ceftriaxone IV for continued treatment of bacteremia/endocarditis.  Plan is for 6 weeks of total antibiotics with a 4-week plan for IV cefazolin transition to oritavancin versus oral antibiotics per ID recommendations.  4-week course of cefazolin will last through until February 8.  Repeat blood cultures (08/29/2019) significant for no growth. -Continue cefazolin 2 g IV every 8 hours  Tricuspid valve endocarditis Pulmonary septic emboli/pneumonia Agitation seen on transesophageal echocardiogram from 08/14/2019.  CT chest on 08/29/2019 significant for multifocal pneumonia with associated right pleural effusion  and compressive atelectasis of the right base.  Patient being treated as mentioned above.  Patient currently with no associated symptoms.  Currently on room air. Chest x-ray improved. Repeat chest x-ray as an outpatient in about 4 weeks.  Pleuritic chest pain Loculated secondary to pneumonia as mentioned above.  CTA negative for acute PE.  Improved.  Hematemesis Noted on chart review.  GI consulted and patient declined upper GI evaluation.  Patient placed on Protonix twice daily.  Anemia Likely anemia related to severe illness. Patient also had an episode of hematemesis. Patient declined EGD. Hemoglobin is stable. Anemia panel suggests chronic disease.  Constipation Improved with magnesium citrate.  Thrombocytopenia Likely secondary to sepsis. Resolved.  IV drug abuse Patient has a history of amphetamine use  Schizophrenia Bipolar disorder Patient has been declining medication as he was trying to get himself off. He was also under the impression that his medication was for sleep. After discussion of benefits and risks, patient now agreeable to taking his medicaiton -Continue Seroquel and Haldol  Fall Occurred during hospitalization. Patient declined PT evaluation. No recurrent falls with ambulation. Per chart review, possibly related to orthostatic hypotension.   DVT prophylaxis: Lovenox Code Status:   Code Status: Full Code Family Communication: None at bedside Disposition Plan: Discharge home pending completion of IV antibiotics   Consultants:   Infectious disease  Procedures:   TRANSTHORACIC ECHOCARDIOGRAM (08/12/2019) IMPRESSIONS    1. Left ventricular ejection fraction, by visual estimation, is 60 to  65%. The left ventricle has normal function. There is no left ventricular  hypertrophy.  2. Global right ventricle has normal systolic function.The right  ventricular size is normal.  3. Left atrial size was normal.  4. Right atrial size was normal.  5. The  mitral valve is normal in structure. Trivial  mitral valve  regurgitation. No evidence of mitral stenosis.  6. The tricuspid valve is normal in structure.  7. The aortic valve is tricuspid. Aortic valve regurgitation is not  visualized. No evidence of aortic valve sclerosis or stenosis.  8. The pulmonic valve was not well visualized. Pulmonic valve  regurgitation is not visualized.  9. The inferior vena cava is normal in size with greater than 50%  respiratory variability, suggesting right atrial pressure of 3 mmHg.  10. Normal LV function; no obvious vegetations.  11. The left ventricle has no regional wall motion abnormalities.   TRANSESOPHAGEAL ECHOCARDIOGRAM (08/14/2019) IMPRESSIONS    1. Left ventricular ejection fraction, by visual estimation, is 60 to  65%. The left ventricle has normal function. There is no left ventricular  hypertrophy.  2. The left ventricle has no regional wall motion abnormalities.  3. Global right ventricle has normal systolic function.The right  ventricular size is normal. No increase in right ventricular wall  thickness.  4. Left atrial size was normal.  5. Right atrial size was normal.  6. The pericardial effusion is circumferential.  7. Trivial pericardial effusion is present.  8. The mitral valve is grossly normal. Trivial mitral valve  regurgitation.  9. Tricuspid valve visualized with 2D/3D modes.  10. Moderately sized vegetation on the tricuspid valve.  11. The tricuspid valve is abnormal.  12. The aortic valve is tricuspid. Aortic valve regurgitation is not  visualized.  13. The pulmonic valve was grossly normal. Pulmonic valve regurgitation is  trivial.  14. Findings consistent with Tricuspid valve endocarditis.   TRANSTHORACIC ECHOCARDIOGRAM (08/27/2019) IMPRESSIONS    1. Left ventricular ejection fraction, by visual estimation, is 60 to  65%. Left ventricular septal wall thickness was moderately increased.  Mildly  increased left ventricular posterior wall thickness. There is  mildly increased left ventricular wall  thickness.  2. The left ventricle has no regional wall motion abnormalities.  3. Global right ventricle has mildly reduced systolic function.The right  ventricular size is moderately enlarged.  4. Hypokinetic right ventricular apex.  5. Trivial mitral valve regurgitation.  6. Tricuspid valve regurgitation is mild.  7. Tricuspid valve regurgitation is mild.  8. Normal pulmonary artery systolic pressure.  9. The inferior vena cava is normal in size with greater than 50%  respiratory variability, suggesting right atrial pressure of 3 mmHg.   Antimicrobials:  Vancomycin IV (1/8>>1/10)  Flagyl IV (1/8>>1/9)  Cefepime IV (1/8>>1/10)  Cefazolin IV (1/10>>    Subjective: No issues overnight.  Objective: Vitals:   09/06/19 1327 09/08/19 0116 09/08/19 2200 09/09/19 0500  BP:  128/64 126/75 104/68  Pulse:  89 82 84  Resp:  20 19 19   Temp:  98.3 F (36.8 C) 98.5 F (36.9 C) 98.5 F (36.9 C)  TempSrc: Oral     SpO2:  100% 98% 98%  Weight:      Height:        Intake/Output Summary (Last 24 hours) at 09/09/2019 1132 Last data filed at 09/09/2019 0231 Gross per 24 hour  Intake 240 ml  Output --  Net 240 ml   Filed Weights   08/12/19 0000  Weight: 103.4 kg    Examination:  General exam: Appears calm and comfortable   Data Reviewed: I have personally reviewed following labs and imaging studies  CBC: Recent Labs  Lab 09/05/19 0547 09/07/19 0454  WBC 8.9 7.6  HGB 11.6* 11.6*  HCT 37.6* 38.1*  MCV 87.2 86.8  PLT 419* 519*   Basic  Metabolic Panel: Recent Labs  Lab 09/05/19 0547 09/07/19 0454  NA 136 138  K 5.0 4.2  CL 102 104  CO2 22 25  GLUCOSE 121* 98  BUN 12 12  CREATININE 0.67 0.71  CALCIUM 9.1 9.4   GFR: Estimated Creatinine Clearance: 147.9 mL/min (by C-G formula based on SCr of 0.71 mg/dL). Liver Function Tests: Recent Labs  Lab  09/05/19 0547  AST 30  ALT 30  ALKPHOS 85  BILITOT 0.9  PROT 7.7  ALBUMIN 3.1*   No results for input(s): LIPASE, AMYLASE in the last 168 hours. No results for input(s): AMMONIA in the last 168 hours. Coagulation Profile: No results for input(s): INR, PROTIME in the last 168 hours. Cardiac Enzymes: No results for input(s): CKTOTAL, CKMB, CKMBINDEX, TROPONINI in the last 168 hours. BNP (last 3 results) No results for input(s): PROBNP in the last 8760 hours. HbA1C: No results for input(s): HGBA1C in the last 72 hours. CBG: No results for input(s): GLUCAP in the last 168 hours. Lipid Profile: No results for input(s): CHOL, HDL, LDLCALC, TRIG, CHOLHDL, LDLDIRECT in the last 72 hours. Thyroid Function Tests: No results for input(s): TSH, T4TOTAL, FREET4, T3FREE, THYROIDAB in the last 72 hours. Anemia Panel: No results for input(s): VITAMINB12, FOLATE, FERRITIN, TIBC, IRON, RETICCTPCT in the last 72 hours. Sepsis Labs: No results for input(s): PROCALCITON, LATICACIDVEN in the last 168 hours.  No results found for this or any previous visit (from the past 240 hour(s)).       Radiology Studies: No results found.      Scheduled Meds: . enoxaparin (LOVENOX) injection  40 mg Subcutaneous Q24H  . feeding supplement (ENSURE ENLIVE)  237 mL Oral BID BM  . haloperidol  2 mg Oral QHS  . mouth rinse  15 mL Mouth Rinse BID  . pantoprazole  40 mg Oral BID  . polyethylene glycol  17 g Oral BID  . QUEtiapine  300 mg Oral QHS  . senna-docusate  2 tablet Oral BID   Continuous Infusions: . sodium chloride Stopped (09/05/19 1103)  .  ceFAZolin (ANCEF) IV 2 g (09/09/19 1003)     LOS: 29 days     Cordelia Poche, MD [facility] 09/09/2019, 11:32 AM  If 7PM-7AM, please contact night-coverage [email or web address]

## 2019-09-10 MED ORDER — ORITAVANCIN DIPHOSPHATE 400 MG IV SOLR
1200.0000 mg | Freq: Once | INTRAVENOUS | Status: DC
  Filled 2019-09-10: qty 120

## 2019-09-10 NOTE — Progress Notes (Signed)
Patient states he will be leaving as soon as his antibiotic is finished infusing.  Patient educated on complications of  leaving without a discharge order.  Patient states he will leave when he wants and he does not care about needing an order. Antibiotic hung and infusing at this time

## 2019-09-10 NOTE — Progress Notes (Signed)
Patient demanding paperwork to sign out against medical advice.  Patient signed paperwork and acknowledged that he was scheduled for another dose of antibiotics, but opted to leave.  Patient acknowledged the risk he was taking and also acknowledged that the MD could not prescribe anything once he signed paperwork.

## 2019-09-10 NOTE — TOC Transition Note (Signed)
Transition of Care [facility]) - CM/SW Discharge Note   Patient Details  Name: Justin Chaney MRN: [id number] Date of Birth: Jul 24, 1996  Transition of Care [facility]) CM/SW Contact:  [name], LCSW Phone Number: 09/10/2019, 9:36 AM   Clinical Narrative:   Patient to d/c today.  States his sister will give him a ride and he will be staying at his mom's for awhile.  Explains that he needs to complete his application for housing at [facility], and with the note I gave him verifying his status, he is hoping to get his own place there.  I STRESSED THE IMPORTANCE OF GOING TO HIS HOSPITAL FOLLOW UP APPOINTMENT ON Friday, and asked that he call to reschedule if he cannot get a ride from a family member.  TOC sign off.    Final next level of care: Home/Self Care Barriers to Discharge: Barriers Resolved   Patient Goals and CMS Choice Patient states their goals for this hospitalization and ongoing recovery are:: "I want to see if I can stay with my mom.  I think she will let me if you tell her it is only for 6 weeks." CMS Medicare.gov Compare Post Acute Care list provided to:: Patient Choice offered to / list presented to : Patient  Discharge Placement                       Discharge Plan and Services   Discharge Planning Services: CM Consult                                 Social Determinants of Health (SDOH) Interventions     Readmission Risk Interventions Readmission Risk Prevention Plan 08/29/2019  Transportation Screening Complete  Medication Review [employer] photographer) Complete  PCP or Specialist appointment within 3-5 days of discharge Not Complete  PCP/Specialist Appt Not Complete comments Earliest appointment is 2 weeks out  [place] or [place] Complete  SW Recovery Care/Counseling Consult Complete  Palliative Care Screening Not [place] Not Applicable  Some recent data might be hidden

## 2019-09-11 NOTE — Discharge Summary (Signed)
Patient left AGAINST MEDICAL ADVICE  Physician Discharge Summary  Justin Chaney [id number] DOB: 09-05-1995 DOA: 08/10/2019  PCP: Patient, No Pcp Per  Admit date: 08/10/2019 Discharge date: 09/11/2019  Admitted From: Home Disposition: Left against medical advice on day of planned discharge  Recommendations for Outpatient Follow-up:  1. Infectious disease follow-up 2. Repeat chest x-ray in 4-6 weeks to ensure resolution of pneumonia signs   Discharge Condition: Guarded CODE STATUS: Full code Diet recommendation: Regular diet   Brief/Interim Summary:  Admission HPI written by Joanne Gavel, PA-C   Chief Complaint: Fever chills.  HPI: Justin Chaney is a 24 y.o. male with history of bipolar disorder, history of VP shunt, schizophrenia and IV drug abuse presents to the ER because of fever chills with nausea vomiting diarrhea shortness of breath ongoing for last 1 week.  Patient states he has not been taking any of his psychiatric medication for many months.  He has been having generalized body ache denies any Covid positive contacts.  He has been short of breath even at rest with some nonproductive cough headache is mostly frontal.  Denies any weakness of the upper or lower extremities.  ED Course: In the ER patient was febrile with temperature 103 F initially tachycardic to 130s.  Blood pressure was in the low normal improved with fluid bolus.  Labs reveal potassium 2.8 sodium 133 creatinine 0.78 lactic acid 3.5 improved with fluids CBC was remarkable for low platelets of 42 with hemoglobin of 11.7.  CT abdomen pelvis and CT chest was unremarkable.  Covid test is pending.  Patient was empirically started on antibiotics for possible loading sepsis of unknown source.  Since patient's LFTs were mildly elevated at around 56 and 52 sonogram of the abdomen was done which was unremarkable.    Hospital course:  Sepsis Present on admission secondary to bacteremia, endocarditis,  multifocal pneumonia.  Resolved with antibiotics.  Staphylococcus aureus bacteremia Likely secondary to IV drug use.  Patient has a history of injecting methamphetamine.  Staph aureus is methicillin sensitive.  Patient initially empirically treated with vancomycin, Flagyl, cefepime and was transitioned to ceftriaxone IV for continued treatment of bacteremia/endocarditis.  Plan is for 6 weeks of total antibiotics with a 4-week plan for IV cefazolin transition to oritavancin versus oral antibiotics per ID recommendations.  4-week course of cefazolin will last through until February 8.  Repeat blood cultures (08/29/2019) significant for no growth. Prior to patient receiving oritavancin, he left against medical advice.  Tricuspid valve endocarditis Pulmonary septic emboli/pneumonia Agitation seen on transesophageal echocardiogram from 08/14/2019.  CT chest on 08/29/2019 significant for multifocal pneumonia with associated right pleural effusion and compressive atelectasis of the right base.  Patient being treated as mentioned above.  Patient currently with no associated symptoms.  Currently on room air. Chest x-ray improved. Repeat chest x-ray as an outpatient in about 4 weeks.  Pleuritic chest pain Loculated secondary to pneumonia as mentioned above.  CTA negative for acute PE.  Improved.  Hematemesis Noted on chart review.  GI consulted and patient declined upper GI evaluation.  Patient placed on Protonix twice daily.  Anemia Likely anemia related to severe illness. Patient also had an episode of hematemesis. Patient declined EGD. Hemoglobin is stable. Anemia panel suggests chronic disease.  Constipation Improved with magnesium citrate.  Thrombocytopenia Likely secondary to sepsis. Resolved.  IV drug abuse Patient has a history of amphetamine use  Schizophrenia Bipolar disorder Patient has been declining medication as he was trying to get himself  off. He was also under the impression  that his medication was for sleep. After discussion of benefits and risks, patient now agreeable to taking his medication. Continue Seroquel and Haldol  Fall Occurred during hospitalization. Patient declined PT evaluation. No recurrent falls with ambulation. Per chart review, possibly related to orthostatic hypotension.  Discharge Diagnoses:  Principal Problem:   Staphylococcus aureus bacteremia Active Problems:   Schizoaffective disorder, bipolar type (HCC)   Tobacco use   SIRS (systemic inflammatory response syndrome) (HCC)   Normocytic anemia   Thrombocytopenia (HCC)   Polysubstance abuse (HCC)   IVDU (intravenous drug user)   S/P VP shunt   Transaminitis   Hyperbilirubinemia   Sepsis with acute organ dysfunction without septic shock [facility])    Discharge Instructions   Allergies as of 09/10/2019      Reactions   Amoxicillin Hives, Other (See Comments)   CHILDHOOD ALLERGY - Per [facility] records, tolerates Ancef and Rocephin Has patient had a PCN reaction causing immediate rash, facial/tongue/throat swelling, SOB or lightheadedness with hypotension: YES Has patient had a PCN reaction causing severe rash involving mucus membranes or skin necrosis: No Has patient had a PCN reaction that required hospitalization No      Medication List    ASK your doctor about these medications   ARIPiprazole ER 400 MG Srer injection Commonly known as: ABILIFY MAINTENA Inject 2 mLs (400 mg total) into the muscle every 28 (twenty-eight) days. DUE APPROX 7/16   benztropine 1 MG tablet Commonly known as: COGENTIN Take 1 tablet (1 mg total) by mouth 2 (two) times daily.   haloperidol 20 MG tablet Commonly known as: HALDOL Take 1 tablet (20 mg total) by mouth at bedtime.   lithium carbonate 300 MG CR tablet Commonly known as: LITHOBID Take 1 tablet (300 mg total) by mouth every 12 (twelve) hours.   traZODone 50 MG tablet Commonly known as: DESYREL Take 1 tablet (50 mg total) by mouth at  bedtime as needed for sleep.      Follow-up [place] Follow up on 09/14/2019.   Specialty: Internal Medicine Why: Friday at 9:20 for your hospital follow up appointment.  If you are unable to keep this appointment, please call to cancel. Contact information: [place] [address] [phone]         Allergies  Allergen Reactions  . Amoxicillin Hives and Other (See Comments)    CHILDHOOD ALLERGY - Per [facility] records, tolerates Ancef and Rocephin Has patient had a PCN reaction causing immediate rash, facial/tongue/throat swelling, SOB or lightheadedness with hypotension: YES Has patient had a PCN reaction causing severe rash involving mucus membranes or skin necrosis: No Has patient had a PCN reaction that required hospitalization No    Consultations:  Infectious disease  Cardiology   Procedures/Studies: DG Chest 2 View  Result Date: 09/07/2019 CLINICAL DATA:  Recent diagnosis of pneumonia, resolution of symptoms, previous tobacco abuse EXAM: CHEST - 2 VIEW COMPARISON:  08/25/2019 FINDINGS: Frontal and lateral views of the chest demonstrate persistent but improving right upper lobe airspace disease. No effusion or pneumothorax. Cardiac silhouette is unremarkable. No acute bony abnormalities. IMPRESSION: 1. Persistent but improving right upper lobe airspace disease. Please note that it may take 6-8 weeks for complete radiographic resolution of pneumonia after appropriate antibiotic therapy. Electronically Signed   By: Randa Ngo M.D.   On: 09/07/2019 08:49   CT ANGIO CHEST PE W OR WO CONTRAST  Result  Date: 08/29/2019 CLINICAL DATA:  Shortness of breath and chest pain EXAM: CT ANGIOGRAPHY CHEST WITH CONTRAST TECHNIQUE: Multidetector CT imaging of the chest was performed using the standard protocol during bolus administration of intravenous contrast. Multiplanar CT image reconstructions and MIPs were obtained to  evaluate the vascular anatomy. CONTRAST:  171mL OMNIPAQUE IOHEXOL 350 MG/ML SOLN COMPARISON:  CT angiogram chest August 11, 2019; perfusion lung scan August 26, 2019 FINDINGS: Cardiovascular: There is no appreciable pulmonary embolus. There is no thoracic aortic aneurysm or dissection. The visualized great vessels appear unremarkable. There is no appreciable pericardial effusion or pericardial thickening. Mediastinum/Nodes: Thyroid appears unremarkable. There is no appreciable thoracic adenopathy. No esophageal lesions are evident. Lungs/Pleura: There is airspace opacity with consolidation throughout much of the anterior segment of the right upper lobe. More patchy infiltrate is noted in the apical segment of the right upper lobe. There is a right pleural effusion with consolidation in the right base, which in part represents compressive atelectasis. There is mild consolidation in the anterior and lateral segments of the left lower lobe with posterior left base atelectasis. Upper Abdomen: Visualized upper abdominal structures appear unremarkable. Musculoskeletal: No blastic or lytic bone lesions. No chest wall lesions evident. Review of the MIP images confirms the above findings. IMPRESSION: 1. No appreciable pulmonary embolus. No thoracic aortic aneurysm or dissection. 2. Multifocal pneumonia. Consolidation is greatest in the anterior segment right upper lobe. There is also consolidation in portions of the anterior and lateral segments of the left lower lobe. More ill-defined infiltrate noted in the apical segment right upper lobe. 3. Right pleural effusion with compressive atelectasis in the right base. There may be a degree of superimposed pneumonia in this area as well. 4.  No evident adenopathy. Electronically Signed   By: Lowella Grip III M.D.   On: 08/29/2019 13:10   NM Pulmonary Perfusion  Result Date: 08/26/2019 CLINICAL DATA:  Shortness of breath, vomiting EXAM: NUCLEAR MEDICINE PERFUSION LUNG  SCAN TECHNIQUE: Perfusion images were obtained in multiple projections after intravenous injection of radiopharmaceutical. Ventilation scans intentionally deferred if perfusion scan and chest x-ray adequate for interpretation during COVID 19 epidemic. RADIOPHARMACEUTICALS:  1.5 mCi Tc-85m MAA IV COMPARISON:  Chest x-ray 08/25/2019 FINDINGS: There is a perfusion defect noted anteriorly in the right upper lobe corresponding to the area of airspace disease on prior chest x-ray. No ventilation was performed. IMPRESSION: Wedge-shaped perfusion defect in the anterior right upper lobe corresponding to airspace disease seen on chest x-ray. Without ventilation, this is indeterminate for pulmonary embolus. Recommend CTA chest once adequate IV access is obtained. Electronically Signed   By: Rolm Baptise M.D.   On: 08/26/2019 19:49   DG CHEST PORT 1 VIEW  Result Date: 08/25/2019 CLINICAL DATA:  Cough.  Endocarditis. EXAM: PORTABLE CHEST 1 VIEW COMPARISON:  08/10/2019 FINDINGS: New airspace densities in the right suprahilar region. Findings are suggestive for pneumonia. Left lung is clear. Heart size is within normal limits and stable. The trachea is midline. IMPRESSION: New opacities in the medial right upper lung and right suprahilar region. Findings are suggestive for pneumonia. Electronically Signed   By: Markus Daft M.D.   On: 08/25/2019 10:38   DG Abd Portable 1V  Result Date: 08/25/2019 CLINICAL DATA:  Nausea and vomiting EXAM: PORTABLE ABDOMEN - 1 VIEW COMPARISON:  None. FINDINGS: Bowel gas pattern is nonobstructive. Moderate amount of stool in the nondistended colon. No evidence of soft tissue mass or abnormal fluid collection. No evidence of free intraperitoneal air. No evidence  of renal or ureteral calculi. Visualized osseous structures are unremarkable. IMPRESSION: Nonobstructive bowel gas pattern. Moderate amount of stool in the nondistended colon. Electronically Signed   By: Franki Cabot M.D.   On:  08/25/2019 10:40   ECHO TEE  Result Date: 08/14/2019   TRANSESOPHOGEAL ECHO REPORT   Patient Name:   RAYDELL HENIGAN Date of Exam: 08/14/2019 Medical Rec #:  [id number]        Height:       62.0 in Accession #:    [id number]       Weight:       228.0 lb Date of Birth:  [birth year]        BSA:          2.02 m Patient Age:    23 years         BP:           103/45 mmHg Patient Gender: M                HR:           90 bpm. Exam Location:  Inpatient  Procedure: Transesophageal Echo, 3D Echo, Limited Color Doppler and Cardiac            Doppler Indications:     Endocarditis I38  History:         Patient has prior history of Echocardiogram examinations, most                  recent 08/12/2019. Substance abuse.  Sonographer:     Darlina Sicilian RDCS Referring Phys:  [id number] Tami Lin [facility] Diagnosing Phys: Lyman Bishop MD  PROCEDURE: Patients was monitored while under deep sedation. The transesophogeal probe was passed through the esophogus of the patient. Imaged were obtained with the patient in a supine position. Image quality was excellent. The patient developed no complications during the procedure. IMPRESSIONS  1. Left ventricular ejection fraction, by visual estimation, is 60 to 65%. The left ventricle has normal function. There is no left ventricular hypertrophy.  2. The left ventricle has no regional wall motion abnormalities.  3. Global right ventricle has normal systolic function.The right ventricular size is normal. No increase in right ventricular wall thickness.  4. Left atrial size was normal.  5. Right atrial size was normal.  6. The pericardial effusion is circumferential.  7. Trivial pericardial effusion is present.  8. The mitral valve is grossly normal. Trivial mitral valve regurgitation.  9. Tricuspid valve visualized with 2D/3D modes. 10. Moderately sized vegetation on the tricuspid valve. 11. The tricuspid valve is abnormal. 12. The aortic valve is tricuspid. Aortic valve regurgitation is not  visualized. 13. The pulmonic valve was grossly normal. Pulmonic valve regurgitation is trivial. 14. Findings consistent with Tricuspid valve endocarditis. FINDINGS  Left Ventricle: Left ventricular ejection fraction, by visual estimation, is 60 to 65%. The left ventricle has normal function. The left ventricle has no regional wall motion abnormalities. There is no left ventricular hypertrophy. Right Ventricle: The right ventricular size is normal. No increase in right ventricular wall thickness. Global RV systolic function is has normal systolic function. Left Atrium: Left atrial size was normal in size. Right Atrium: Right atrial size was normal in size Pericardium: Trivial pericardial effusion is present. The pericardial effusion is circumferential. Mitral Valve: The mitral valve is grossly normal. Trivial mitral valve regurgitation. Tricuspid Valve: The tricuspid valve is abnormal. Tricuspid valve regurgitation mild-moderate. There is a moderately sized mobile tricuspid valve vegetation on the septal  leaflet. The TV vegetation measures 15 mm x 5 mm. The flow in the hepatic veins is normal during ventricular systole. Tricuspid valve visualized with 2D/3D modes. Aortic Valve: The aortic valve is tricuspid. Aortic valve regurgitation is not visualized. Pulmonic Valve: The pulmonic valve was grossly normal. Pulmonic valve regurgitation is trivial. Aorta: The aortic root and ascending aorta are structurally normal, with no evidence of dilitation. Shunts: No atrial level shunt detected by color flow Doppler.  Lyman Bishop MD Electronically signed by Lyman Bishop MD Signature Date/Time: 08/14/2019/1:11:51 PM    Final    ECHOCARDIOGRAM LIMITED  Result Date: 08/27/2019   ECHOCARDIOGRAM LIMITED REPORT   Patient Name:   JAQUEL GREENLEAF Date of Exam: 08/27/2019 Medical Rec #:  [id number]        Height:       62.0 in Accession #:    [id number]       Weight:       228.0 lb Date of Birth:  03-17-1996        BSA:           2.02 m Patient Age:    23 years         BP:           110/67 mmHg Patient Gender: M                HR:           109 bpm. Exam Location:  Inpatient  Procedure: Limited Echo, Cardiac Doppler and Color Doppler Indications:    I26.02 Pulmonary embolus (suspected)  History:        Patient has prior history of Echocardiogram examinations. IVDU.                 Suspected pulmonary embolism.  Sonographer:    Roseanna Rainbow RDCS Referring Phys: [id number] [place]  Sonographer Comments: Limited echo for right heart strain due to suspected pulmonary embolus. Unable to obtain CTA. IMPRESSIONS  1. Left ventricular ejection fraction, by visual estimation, is 60 to 65%. Left ventricular septal wall thickness was moderately increased. Mildly increased left ventricular posterior wall thickness. There is mildly increased left ventricular wall thickness.  2. The left ventricle has no regional wall motion abnormalities.  3. Global right ventricle has mildly reduced systolic function.The right ventricular size is moderately enlarged.  4. Hypokinetic right ventricular apex.  5. Trivial mitral valve regurgitation.  6. Tricuspid valve regurgitation is mild.  7. Tricuspid valve regurgitation is mild.  8. Normal pulmonary artery systolic pressure.  9. The inferior vena cava is normal in size with greater than 50% respiratory variability, suggesting right atrial pressure of 3 mmHg. FINDINGS  Left Ventricle: Left ventricular ejection fraction, by visual estimation, is 60 to 65%. The left ventricle has no regional wall motion abnormalities. Left ventricular septal wall thickness was moderately increased. Mildly increased left ventricular posterior wall thickness. There is mildly increased left ventricular wall thickness. Left ventricular diastolic parameters were normal. Normal left atrial pressure. Right Ventricle: The right ventricular size is moderately enlarged. Global RV systolic function is has mildly reduced systolic function. The  tricuspid regurgitant velocity is 2.03 m/s, and with an assumed right atrial pressure of 3 mmHg, the estimated right ventricular systolic pressure is normal at 19.5 mmHg. The motion of the right ventricle apex hypokinetic. Mitral Valve: Trivial mitral valve regurgitation. Tricuspid Valve: Tricuspid valve regurgitation is mild. Venous: The inferior vena cava is normal in size with greater than 50% respiratory variability, suggesting right  atrial pressure of 3 mmHg.  LEFT VENTRICLE          Normals PLAX 2D LVIDd:         3.80 cm  3.6 cm LVIDs:         2.40 cm  1.7 cm LV PW:         1.20 cm  1.4 cm LV IVS:        1.50 cm  1.3 cm LVOT diam:     2.00 cm  2.0 cm LV SV:         42 ml    79 ml LV SV Index:   19.13    45 ml/m2 LVOT Area:     3.14 cm 3.14 cm2  LV Volumes (MOD)             Normals LV area d, A2C:    22.80 cm LV area d, A4C:    34.70 cm LV area s, A2C:    12.60 cm LV area s, A4C:    19.40 cm LV major d, A2C:   7.28 cm LV major d, A4C:   8.05 cm LV major s, A2C:   5.61 cm LV major s, A4C:   6.50 cm LV vol d, MOD A2C: 59.8 ml   68 ml LV vol d, MOD A4C: 120.0 ml LV vol s, MOD A2C: 25.0 ml   24 ml LV vol s, MOD A4C: 48.3 ml LV SV MOD A2C:     34.8 ml LV SV MOD A4C:     120.0 ml LV SV MOD BP:      51.7 ml   45 ml RIGHT VENTRICLE             IVC RV S prime:     11.70 cm/s  IVC diam: 1.60 cm TAPSE (M-mode): 1.8 cm LEFT ATRIUM         Index LA diam:    3.40 cm 1.68 cm/m   AORTA                 Normals Ao Root diam: 3.40 cm 31 mm TRICUSPID VALVE             Normals TR Peak grad:   16.5 mmHg TR Vmax:        203.00 cm/s 288 cm/s  SHUNTS Systemic Diam: 2.00 cm  Skeet Latch MD Electronically signed by Skeet Latch MD Signature Date/Time: 08/27/2019/11:29:40 AM    Final    [name] EKG SITE RITE  Result Date: 08/26/2019 If Site Rite image not attached, placement could not be confirmed due to current cardiac rhythm.    TRANSTHORACIC ECHOCARDIOGRAM (08/12/2019) IMPRESSIONS    1. Left ventricular ejection  fraction, by visual estimation, is 60 to  65%. The left ventricle has normal function. There is no left ventricular  hypertrophy.  2. Global right ventricle has normal systolic function.The right  ventricular size is normal.  3. Left atrial size was normal.  4. Right atrial size was normal.  5. The mitral valve is normal in structure. Trivial mitral valve  regurgitation. No evidence of mitral stenosis.  6. The tricuspid valve is normal in structure.  7. The aortic valve is tricuspid. Aortic valve regurgitation is not  visualized. No evidence of aortic valve sclerosis or stenosis.  8. The pulmonic valve was not well visualized. Pulmonic valve  regurgitation is not visualized.  9. The inferior vena cava is normal in size with greater than 50%  respiratory variability, suggesting right atrial  pressure of 3 mmHg.  10. Normal LV function; no obvious vegetations.  11. The left ventricle has no regional wall motion abnormalities.   TRANSESOPHAGEAL ECHOCARDIOGRAM (08/14/2019) IMPRESSIONS    1. Left ventricular ejection fraction, by visual estimation, is 60 to  65%. The left ventricle has normal function. There is no left ventricular  hypertrophy.  2. The left ventricle has no regional wall motion abnormalities.  3. Global right ventricle has normal systolic function.The right  ventricular size is normal. No increase in right ventricular wall  thickness.  4. Left atrial size was normal.  5. Right atrial size was normal.  6. The pericardial effusion is circumferential.  7. Trivial pericardial effusion is present.  8. The mitral valve is grossly normal. Trivial mitral valve  regurgitation.  9. Tricuspid valve visualized with 2D/3D modes.  10. Moderately sized vegetation on the tricuspid valve.  11. The tricuspid valve is abnormal.  12. The aortic valve is tricuspid. Aortic valve regurgitation is not  visualized.  13. The pulmonic valve was grossly normal. Pulmonic valve  regurgitation is  trivial.  14. Findings consistent with Tricuspid valve endocarditis.   TRANSTHORACIC ECHOCARDIOGRAM (08/27/2019) IMPRESSIONS    1. Left ventricular ejection fraction, by visual estimation, is 60 to  65%. Left ventricular septal wall thickness was moderately increased.  Mildly increased left ventricular posterior wall thickness. There is  mildly increased left ventricular wall  thickness.  2. The left ventricle has no regional wall motion abnormalities.  3. Global right ventricle has mildly reduced systolic function.The right  ventricular size is moderately enlarged.  4. Hypokinetic right ventricular apex.  5. Trivial mitral valve regurgitation.  6. Tricuspid valve regurgitation is mild.  7. Tricuspid valve regurgitation is mild.  8. Normal pulmonary artery systolic pressure.  9. The inferior vena cava is normal in size with greater than 50%  respiratory variability, suggesting right atrial pressure of 3 mmHg.  Subjective: Wants to go home immediately rather than completing antibiotic course this afternoon. Would not agree to waiting to receive final antibiotic dose which would have covered him for 2 weeks.  Discharge Exam: Vitals:   09/09/19 1300 09/09/19 2000  BP: 109/72 107/62  Pulse: 85 91  Resp: 20 18  Temp: 98.6 F (37 C) 98.7 F (37.1 C)  SpO2: 98% 99%   Vitals:   09/08/19 2200 09/09/19 0500 09/09/19 1300 09/09/19 2000  BP: 126/75 104/68 109/72 107/62  Pulse: 82 84 85 91  Resp: 19 19 20 18   Temp: 98.5 F (36.9 C) 98.5 F (36.9 C) 98.6 F (37 C) 98.7 F (37.1 C)  TempSrc:   Oral Oral  SpO2: 98% 98% 98% 99%  Weight:      Height:        General: Pt is alert, awake, not in acute distress   The results of significant diagnostics from this hospitalization (including imaging, microbiology, ancillary and laboratory) are listed below for reference.     Microbiology: No results found for this or any previous visit (from the past 240  hour(s)).   Labs: BNP (last 3 results) No results for input(s): BNP in the last 8760 hours. Basic Metabolic Panel: Recent Labs  Lab 09/05/19 0547 09/07/19 0454  NA 136 138  K 5.0 4.2  CL 102 104  CO2 22 25  GLUCOSE 121* 98  BUN 12 12  CREATININE 0.67 0.71  CALCIUM 9.1 9.4   Liver Function Tests: Recent Labs  Lab 09/05/19 0547  AST 30  ALT 30  ALKPHOS  85  BILITOT 0.9  PROT 7.7  ALBUMIN 3.1*   No results for input(s): LIPASE, AMYLASE in the last 168 hours. No results for input(s): AMMONIA in the last 168 hours. CBC: Recent Labs  Lab 09/05/19 0547 09/07/19 0454  WBC 8.9 7.6  HGB 11.6* 11.6*  HCT 37.6* 38.1*  MCV 87.2 86.8  PLT 419* 519*   Cardiac Enzymes: No results for input(s): CKTOTAL, CKMB, CKMBINDEX, TROPONINI in the last 168 hours. BNP: Invalid input(s): POCBNP CBG: No results for input(s): GLUCAP in the last 168 hours. D-Dimer No results for input(s): DDIMER in the last 72 hours. Hgb A1c No results for input(s): HGBA1C in the last 72 hours. Lipid Profile No results for input(s): CHOL, HDL, LDLCALC, TRIG, CHOLHDL, LDLDIRECT in the last 72 hours. Thyroid function studies No results for input(s): TSH, T4TOTAL, T3FREE, THYROIDAB in the last 72 hours.  Invalid input(s): FREET3 Anemia work up No results for input(s): VITAMINB12, FOLATE, FERRITIN, TIBC, IRON, RETICCTPCT in the last 72 hours. Urinalysis    Component Value Date/Time   COLORURINE YELLOW 08/10/2019 1615   APPEARANCEUR CLEAR 08/10/2019 1615   LABSPEC 1.018 08/10/2019 1615   PHURINE 7.0 08/10/2019 1615   GLUCOSEU NEGATIVE 08/10/2019 1615   HGBUR SMALL (A) 08/10/2019 1615   BILIRUBINUR NEGATIVE 08/10/2019 1615   KETONESUR NEGATIVE 08/10/2019 1615   PROTEINUR NEGATIVE 08/10/2019 1615   UROBILINOGEN 2.0 (H) 04/01/2014 2200   NITRITE NEGATIVE 08/10/2019 1615   LEUKOCYTESUR NEGATIVE 08/10/2019 1615   Sepsis Labs Invalid input(s): PROCALCITONIN,  WBC,  LACTICIDVEN Microbiology No  results found for this or any previous visit (from the past 240 hour(s)).   Time coordinating discharge: 35 minutes  SIGNED:   Cordelia Poche, MD [facility] 09/11/2019, 2:22 PM

## 2019-09-14 ENCOUNTER — Ambulatory Visit: Payer: Self-pay | Admitting: Family Medicine

## 2019-10-18 ENCOUNTER — Ambulatory Visit: Payer: Medicaid Other | Admitting: Internal Medicine

## 2019-12-13 ENCOUNTER — Observation Stay (HOSPITAL_BASED_OUTPATIENT_CLINIC_OR_DEPARTMENT_OTHER)
Admission: AD | Admit: 2019-12-13 | Discharge: 2019-12-16 | Disposition: A | Discharge status: Home / Self Care | Payer: Medicaid Other | Source: Home / Self Care | Attending: Psychiatry | Admitting: Psychiatry

## 2019-12-13 ENCOUNTER — Observation Stay (HOSPITAL_COMMUNITY)
Admission: RE | Admit: 2019-12-13 | Discharge: 2019-12-13 | Disposition: A | Discharge status: Other Acute Inpatient Hospital | Payer: Medicaid Other | Attending: Behavioral Health | Admitting: Behavioral Health

## 2019-12-13 ENCOUNTER — Encounter (HOSPITAL_COMMUNITY): Payer: Self-pay | Admitting: Behavioral Health

## 2019-12-13 ENCOUNTER — Encounter (HOSPITAL_COMMUNITY): Payer: Self-pay | Admitting: Psychiatry

## 2019-12-13 ENCOUNTER — Other Ambulatory Visit: Payer: Self-pay

## 2019-12-13 DIAGNOSIS — F909 Attention-deficit hyperactivity disorder, unspecified type: Secondary | ICD-10-CM | POA: Insufficient documentation

## 2019-12-13 DIAGNOSIS — F209 Schizophrenia, unspecified: Secondary | ICD-10-CM | POA: Diagnosis present

## 2019-12-13 DIAGNOSIS — F1410 Cocaine abuse, uncomplicated: Secondary | ICD-10-CM | POA: Insufficient documentation

## 2019-12-13 DIAGNOSIS — J45909 Unspecified asthma, uncomplicated: Secondary | ICD-10-CM | POA: Insufficient documentation

## 2019-12-13 DIAGNOSIS — F17210 Nicotine dependence, cigarettes, uncomplicated: Secondary | ICD-10-CM | POA: Diagnosis not present

## 2019-12-13 DIAGNOSIS — Z79899 Other long term (current) drug therapy: Secondary | ICD-10-CM | POA: Insufficient documentation

## 2019-12-13 DIAGNOSIS — R45851 Suicidal ideations: Principal | ICD-10-CM | POA: Insufficient documentation

## 2019-12-13 DIAGNOSIS — Z982 Presence of cerebrospinal fluid drainage device: Secondary | ICD-10-CM | POA: Insufficient documentation

## 2019-12-13 DIAGNOSIS — Z9114 Patient's other noncompliance with medication regimen: Secondary | ICD-10-CM | POA: Diagnosis not present

## 2019-12-13 DIAGNOSIS — F1510 Other stimulant abuse, uncomplicated: Secondary | ICD-10-CM | POA: Diagnosis present

## 2019-12-13 DIAGNOSIS — Z20822 Contact with and (suspected) exposure to covid-19: Secondary | ICD-10-CM | POA: Diagnosis not present

## 2019-12-13 DIAGNOSIS — F250 Schizoaffective disorder, bipolar type: Secondary | ICD-10-CM | POA: Diagnosis present

## 2019-12-13 DIAGNOSIS — F259 Schizoaffective disorder, unspecified: Secondary | ICD-10-CM | POA: Diagnosis present

## 2019-12-13 DIAGNOSIS — F329 Major depressive disorder, single episode, unspecified: Secondary | ICD-10-CM | POA: Diagnosis not present

## 2019-12-13 LAB — URINALYSIS, COMPLETE (UACMP) WITH MICROSCOPIC
Bilirubin Urine: NEGATIVE
Glucose, UA: NEGATIVE mg/dL
Hgb urine dipstick: NEGATIVE
Ketones, ur: NEGATIVE mg/dL
Leukocytes,Ua: NEGATIVE
Nitrite: NEGATIVE
Protein, ur: NEGATIVE mg/dL
Specific Gravity, Urine: 1.013 (ref 1.005–1.030)
pH: 6 (ref 5.0–8.0)

## 2019-12-13 LAB — RAPID URINE DRUG SCREEN, HOSP PERFORMED
Amphetamines: POSITIVE — AB
Barbiturates: NOT DETECTED
Benzodiazepines: NOT DETECTED
Cocaine: NOT DETECTED
Opiates: NOT DETECTED
Tetrahydrocannabinol: NOT DETECTED

## 2019-12-13 LAB — COMPREHENSIVE METABOLIC PANEL
ALT: 32 U/L (ref 0–44)
AST: 39 U/L (ref 15–41)
Albumin: 4 g/dL (ref 3.5–5.0)
Alkaline Phosphatase: 76 U/L (ref 38–126)
Anion gap: 11 (ref 5–15)
BUN: 12 mg/dL (ref 6–20)
CO2: 24 mmol/L (ref 22–32)
Calcium: 8.9 mg/dL (ref 8.9–10.3)
Chloride: 105 mmol/L (ref 98–111)
Creatinine, Ser: 0.86 mg/dL (ref 0.61–1.24)
GFR calc Af Amer: >60 mL/min (ref >60)
GFR calc non Af Amer: >60 mL/min (ref >60)
Glucose, Bld: 112 mg/dL — ABNORMAL HIGH (ref 70–99)
Potassium: 3.5 mmol/L (ref 3.5–5.1)
Sodium: 140 mmol/L (ref 135–145)
Total Bilirubin: 0.5 mg/dL (ref 0.3–1.2)
Total Protein: 6.7 g/dL (ref 6.5–8.1)

## 2019-12-13 LAB — CBC WITH DIFFERENTIAL/PLATELET
Abs Immature Granulocytes: 0.01 10*3/uL (ref 0.00–0.07)
Basophils Absolute: 0.1 10*3/uL (ref 0.0–0.1)
Basophils Relative: 1 %
Eosinophils Absolute: 0.2 10*3/uL (ref 0.0–0.5)
Eosinophils Relative: 3 %
HCT: 44.6 % (ref 39.0–52.0)
Hemoglobin: 14.4 g/dL (ref 13.0–17.0)
Immature Granulocytes: 0 %
Lymphocytes Relative: 34 %
Lymphs Abs: 2.4 10*3/uL (ref 0.7–4.0)
MCH: 27.4 pg (ref 26.0–34.0)
MCHC: 32.3 g/dL (ref 30.0–36.0)
MCV: 85 fL (ref 80.0–100.0)
Monocytes Absolute: 0.9 10*3/uL (ref 0.1–1.0)
Monocytes Relative: 13 %
Neutro Abs: 3.5 10*3/uL (ref 1.7–7.7)
Neutrophils Relative %: 49 %
Platelets: 262 10*3/uL (ref 150–400)
RBC: 5.25 MIL/uL (ref 4.22–5.81)
RDW: 13.2 % (ref 11.5–15.5)
WBC: 7 10*3/uL (ref 4.0–10.5)
nRBC: 0 % (ref 0.0–0.2)

## 2019-12-13 LAB — SARS CORONAVIRUS 2 BY RT PCR (HOSPITAL ORDER, PERFORMED IN ~~LOC~~ HOSPITAL LAB): SARS Coronavirus 2: NEGATIVE

## 2019-12-13 LAB — ETHANOL: Alcohol, Ethyl (B): <10 mg/dL (ref <10)

## 2019-12-13 MED ORDER — ACETAMINOPHEN 325 MG PO TABS: 650.0000 mg | ORAL_TABLET | Freq: Four times a day (QID) | ORAL | Status: DC | PRN

## 2019-12-13 MED ORDER — BENZTROPINE MESYLATE 1 MG PO TABS
1.0000 mg | ORAL_TABLET | Freq: Two times a day (BID) | ORAL | Status: DC
  Administered 2019-12-14 – 2019-12-16 (×7): 1 mg via ORAL
  Filled 2019-12-13 (×7): qty 1

## 2019-12-13 MED ORDER — HYDROXYZINE HCL 25 MG PO TABS
25.0000 mg | ORAL_TABLET | Freq: Three times a day (TID) | ORAL | Status: DC | PRN
  Administered 2019-12-14 – 2019-12-16 (×3): 25 mg via ORAL
  Filled 2019-12-13 (×3): qty 1

## 2019-12-13 MED ORDER — BENZTROPINE MESYLATE 1 MG PO TABS
1.0000 mg | ORAL_TABLET | Freq: Two times a day (BID) | ORAL | Status: DC
  Administered 2019-12-13: 1 mg via ORAL
  Filled 2019-12-13: qty 1

## 2019-12-13 MED ORDER — ARIPIPRAZOLE ER 400 MG IM SRER
400.0000 mg | INTRAMUSCULAR | Status: DC
  Administered 2019-12-13: 400 mg via INTRAMUSCULAR
  Filled 2019-12-13: qty 2

## 2019-12-13 MED ORDER — ARIPIPRAZOLE 5 MG PO TABS
5.0000 mg | ORAL_TABLET | Freq: Every day | ORAL | Status: DC
  Administered 2019-12-14 – 2019-12-16 (×3): 5 mg via ORAL
  Filled 2019-12-13 (×3): qty 1

## 2019-12-13 MED ORDER — ARIPIPRAZOLE 5 MG PO TABS
5.0000 mg | ORAL_TABLET | Freq: Every day | ORAL | Status: DC
  Administered 2019-12-13: 5 mg via ORAL
  Filled 2019-12-13: qty 1

## 2019-12-13 MED ORDER — TRAZODONE HCL 50 MG PO TABS
50.0000 mg | ORAL_TABLET | Freq: Every evening | ORAL | Status: DC | PRN
  Filled 2019-12-13: qty 1

## 2019-12-13 MED ORDER — MAGNESIUM HYDROXIDE 400 MG/5ML PO SUSP: 30.0000 mL | Freq: Every day | ORAL | Status: DC | PRN

## 2019-12-13 MED ORDER — ALUM & MAG HYDROXIDE-SIMETH 200-200-20 MG/5ML PO SUSP: 30.0000 mL | ORAL | Status: DC | PRN

## 2019-12-13 MED ORDER — TRAZODONE HCL 50 MG PO TABS
50.0000 mg | ORAL_TABLET | Freq: Every evening | ORAL | Status: DC | PRN
  Administered 2019-12-14 (×2): 50 mg via ORAL
  Filled 2019-12-13 (×2): qty 1

## 2019-12-13 MED ORDER — MAGNESIUM HYDROXIDE 400 MG/5ML PO SUSP
30.0000 mL | Freq: Every day | ORAL | Status: DC | PRN
  Filled 2019-12-13: qty 30

## 2019-12-13 MED ORDER — ARIPIPRAZOLE ER 400 MG IM SRER: 400.0000 mg | INTRAMUSCULAR | Status: DC

## 2019-12-13 MED ORDER — HYDROXYZINE HCL 25 MG PO TABS
25.0000 mg | ORAL_TABLET | Freq: Three times a day (TID) | ORAL | Status: DC | PRN
  Filled 2019-12-13: qty 1

## 2019-12-13 NOTE — ED Notes (Signed)
Tele-psych at bedside.

## 2019-12-13 NOTE — ED Notes (Signed)
Pt is calling mother for a ride at this time. Pt has belongings at this time.

## 2019-12-13 NOTE — Consult Note (Signed)
Able to reach patient's mother, Justin Chaney, who is also the IVC petitioner. She reports patient is paranoid, delusional, worsening suicidal and homicidal behaviors in the setting of medication non-compliance.  States he is taking injectable medications, she is not sure of the name but notes symptomatic decline over the past few weeks since he missed his injection.  He is managed outpatient by [facility] for which outpatient follow-up for medication management was recommended but she is uncomfortable with this and cites safety concerns.   In lieu of his mother's verbalized safety concerns and the patient now requesting inpatient care for mood stabilization, will do the following:  Recommend 24 hours observation at [facility] for monitoring, can restart medication at this time.  EKG: QT/QTC 375/441 WNL, can restart the following medications:  Abilify Mantena 400mg  IM q28 days for mood stabilization Abilify 5mg  po daily for mood stabilization Cogentin 1mg  po BID for EPS  Can reassess patient in the AM for discharge.  In the interim, will ask SW to assist in coordinating outpatient follow-up with [name] prior to discharge. The plan was discussed with the patient and his mother with concordance.

## 2019-12-13 NOTE — Plan of Care (Signed)
[facility] Observation Crisis Plan  Reason for Crisis Plan:  Crisis Stabilization   Plan of Care:  Referral for Telepsychiatry/Psychiatric Consult  Family Support:      Current Living Environment:  Living Arrangements: Parent  Insurance:   Hospital Account    Name Acct ID Class Status Primary Coverage   Justin Chaney, Justin Chaney [id number] [place]        Guarantor Account (for Hospital Account [id number])    Name Relation to Pt Service Area Active? Acct Type   Justin Chaney Self [facility] Yes Behavioral Health   Address Phone       [place], [place] [address] [phone])          Coverage Information (for Hospital Account [id number])    F/O Payor/Plan Precert #   [facility] MEDICAID/[place]ICA[place]    Subscriber Subscriber #   Justin Chaney, Justin Chaney [id number]   Address Phone   [address] [place],  [address] [phone]      Legal Guardian:     Primary Care Provider:  Patient, No Pcp Per  Current Outpatient Providers:  unknown  Psychiatrist:     Counselor/Therapist:     Compliant with Medications:  Yes  Additional Information:   Justin Chaney 5/13/20216:11 PM

## 2019-12-13 NOTE — Progress Notes (Signed)
[employer] contacted for transport to [facility] for medical clearance.  Report given to Charge Nurse Illene..  Pt is IVC.  COVID test completed.

## 2019-12-13 NOTE — ED Notes (Signed)
This patients mother is refusing to come pick him up unless he is medicated or placed in psych. The pt now does not want to leave without any medication. Pt states"These voices are fucking my head up. I need help". Psych NP made aware.

## 2019-12-13 NOTE — BH Assessment (Signed)
[facility] Assessment Progress Note  Per Merlyn Lot, NP, this pt does not require psychiatric hospitalization at this time.  Pt presents under IVC initiated by pt's mother, which has been rescinded by Hampton Abbot, MD.  Pt is to be discharged from [facility] with recommendation to resume treatment with [facility].  This has been included in pt's discharge instructions.  I have made a call to [facility] to schedule an appointment; call rolled to voice mail and I left a message.  Pt is not to be held in the ED while awaiting a return call.  Pt's nurse, Crystal, has been notified.  Jalene Mullet, [facility] Triage Specialist [phone]

## 2019-12-13 NOTE — Progress Notes (Signed)
24 yo AA male admitted to observation unit due to SI with plan to OD on heroin or cut his wrist. Pt denies HI but endorse A/VH of "voices of spirits telling me I'm worthless and I see demons at night". Patient reports, "My suicidal thoughts come and go because I'm going through a lot of problems lately but I don't feel like talking about it right now. I'm just depressed". Pt denies intent to harm self or others at current. Pt endorses using crystal meth and listening to music as a coping skill. Pt stated his 2 goals while at the facility were to "get my medication straight and to learn how to deal with my emotions". Encouragement provided. Patient remained cooperative and calm throughout assessment. Q15 minute observation rounds initiated per facility protocol. Will monitor for safety.

## 2019-12-13 NOTE — ED Notes (Signed)
Pt provided with breakfast tray at this time.

## 2019-12-13 NOTE — ED Notes (Signed)
Pt requesting inpatient treatment at this time. Merlyn Lot NP made aware. Pt will be in patient.

## 2019-12-13 NOTE — ED Notes (Signed)
Per [name]: Pt to be discharged home. Justin Chaney is working to rescind IVC paperwork at this time. Pt provided with meal tray at this time

## 2019-12-13 NOTE — Progress Notes (Signed)
Patient ID: Justin Chaney, male   DOB: [birth year], 24 y.o.   MRN: [id number] Pt A&O x 4, resting at present, remains SI with AVH. Calm & cooperative at present, no distress noted, monitoring for safety.

## 2019-12-13 NOTE — H&P (Signed)
[name] MRN:  [id number] Date of Evaluation:  12/13/2019 Chief Complaint:  Schizophrenia ([place]) [F20.9] Schizoaffective disorder, bipolar type ([place]) [F25.0] Principal Diagnosis: <principal problem not specified> Diagnosis:  Active Problems:   Schizoaffective disorder, bipolar type ([place])   Schizophrenia ([place])  History of Present Illness:  The patient was initially cleared for discharge from [facility] earlier today but at the request of his mother who took out a IVC petition, that was rescinded, he agrees to remain for an additional 24 hours for monitoring.  His mother, Justin Chaney reports the patient is paranoid, delusional, worsening suicidal and homicidal behaviors in the setting of medication non-compliance and she no longer feels she can keep him safe at home.  In lieu of his mother's verbalized safety concerns and the patient now requesting inpatient care for mood stabilization, will restart his medications.    Per [facility] MSE Exam 12/13/2019: Justin Chaney is an 24 y.o. male who presents under IVC brought in by [employer]. Per IVC, "[name] is diagnosed with schizoaffective disorder. Mother states respondent has not taken medications in at least a month. He has not bathed in at least 3 weeks. [name] is stating people can see through his windows and are talking about him and his private parts. Pupils have been dilated and is sweating for a week. [name] told mother tonight if something happens to him remember he loves them. In the past this has been language respondent used before he attempts suicide"             Pt reports he did not want to come in to [facility] and had told his mother he was okay but she did not listen. Pt states "Don't know myself, don't enjoy life, never enjoyed life, always been an outsider". Pt is not specific about his stressors but states "life in general is stressful". He reports he's being using  crack cocaine for 2 weeks in a row in an attempt to overdose. 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 He endorses SI. Reports self harm in the past, last time was last year. He enordses AVH, states he see devils and demons through walls and stuff and they say "bad stuff" to him. Pt reports paranoia states he can hear people talking about him. He reports a history of suicidal attempt last year by hanging. Pt denies HI. Pt denies access to guns or weapons. He gets his outpatient services from [facility] . Pt states he does not know if he's on any medication and has not taken any in a longtime. Reports he has not slept in 2 weeks and has a poor appetite. Pt states he is on disability and lives with family members.  During evaluation pt is sitting; he is alert to person and place; cooperative; and mood is depressed/anxious congruent with affect. Pt is speaking in a clear tone at moderate volume, and normal pace; with poor eye contact.  His thought process is coherent and relevant; There is no indication that he is currently responding to internal/external stimuli or experiencing delusional thought content. Pt's insight is shallow, judgement is poor and impulse control is fair at this time.    Associated Signs/Symptoms: Depression Symptoms:  depressed mood, disturbed sleep, (Hypo) Manic Symptoms:  Impulsivity, Anxiety Symptoms:  Social Anxiety, per information obtained from patient's mother Psychotic Symptoms:  Delusions, Paranoia, per IVC and information obtained from patient's mother PTSD Symptoms: NA Total Time spent with patient: 30 minutes  Past Psychiatric History:   Is the patient at risk to self? No. per IVC   Has the patient been a risk to self in the past 6 months? Yes.   per IVC Has the patient been a risk to self within the distant past? Yes.    Is the patient a risk to others? Yes.  Per IVC and information reported by his mother  Has the patient been a risk to others in  the past 6 months? Yes.    Has the patient been a risk to others within the distant past? No.   Prior Inpatient Therapy: Prior Inpatient Therapy: Yes Prior Therapy Dates: (2020) Prior Therapy Facilty/Provider(s): [facility] Reason for Treatment: SI, schizo-affective Prior Outpatient Therapy: Prior Outpatient Therapy: Yes Prior Therapy Dates: 2020 Prior Therapy Facilty/Provider(s): [facility] Does patient have an ACCT team?: No Does patient have Intensive In-House Services?  : No Does patient have [facility] services? : No Does patient have P4CC services?: No  Alcohol Screening: 1. How often do you have a drink containing alcohol?: Monthly or less 2. How many drinks containing alcohol do you have on a typical day when you are drinking?: 3 or 4 3. How often do you have six or more drinks on one occasion?: Monthly AUDIT-C Score: 4 4. How often during the last year have you found that you were not able to stop drinking once you had started?: Less than monthly 5. How often during the last year have you failed to do what was normally expected from you because of drinking?: Never 6. How often during the last year have you needed a first drink in the morning to get yourself going after a heavy drinking session?: Never 7. How often during the last year have you had a feeling of guilt of remorse after drinking?: Never 8. How often during the last year have you been unable to remember what happened the night before because you had been drinking?: Never 9. Have you or someone else been injured as a result of your drinking?: No 10. Has a relative or friend or a doctor or another health worker been concerned about your drinking or suggested you cut down?: No Alcohol Use Disorder Identification Test Final Score (AUDIT): 5 Alcohol Brief Interventions/Follow-up: AUDIT Score <7 follow-up not indicated Substance Abuse History in the last 12 months:  Yes.   Consequences of Substance Abuse: Family Consequences:   disrupted family relationship Previous Psychotropic Medications: Yes  Psychological Evaluations: Yes  Past Medical History:  Past Medical History:  Diagnosis Date  . Asthma   . Bipolar disorder ([place])   . 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 Depression   . Homelessness   . Schizophrenia ([place])   . Seizures ([place])    childhood seizures  . Suicidal behavior   . Suicidal intent     Past Surgical History:  Procedure Laterality Date  . APPENDECTOMY    . SHUNT REMOVAL    . TEE WITHOUT CARDIOVERSION N/A 08/14/2019   Procedure: TRANSESOPHAGEAL ECHOCARDIOGRAM (TEE);  Surgeon: Pixie Casino, MD;  Location: [facility] ENDOSCOPY;  Service: Cardiovascular;  Laterality: N/A;  . TONSILLECTOMY    . VENTRICULO-PERITONEAL SHUNT PLACEMENT / LAPAROSCOPIC INSERTION PERITONEAL CATHETER     Family History:  Family History  Problem Relation Age of Onset  . Hypertension Mother   . Mental illness Neg Hx    Family Psychiatric History: unknown Tobacco Screening:   Social History:  Social History   Substance and Sexual Activity  Alcohol Use No     Social History   Substance and Sexual Activity  Drug Use Yes  . Types: Other-see comments, Amphetamines    Additional Social History: Marital status: Single    Pain Medications: see MAR Prescriptions: see MAR Over the Counter: see MAR                    Allergies:   Allergies  Allergen Reactions  . Amoxicillin Hives and Other (See Comments)    CHILDHOOD ALLERGY - Per [facility] records, tolerates Ancef and Rocephin Has patient had a PCN reaction causing immediate rash, facial/tongue/throat swelling, SOB or lightheadedness with hypotension: YES Has patient had a PCN reaction causing severe rash involving mucus membranes or skin necrosis: No Has patient had a PCN reaction that required hospitalization No   Lab Results:  Results for orders placed or performed during the hospital encounter of 12/13/19 (from the past 48  hour(s))  SARS Coronavirus 2 by RT PCR (hospital order, performed in [facility] hospital lab) Nasopharyngeal Nasopharyngeal Swab     Status: None   Collection Time: 12/13/19  2:28 AM   Specimen: Nasopharyngeal Swab  Result Value Ref Range   SARS Coronavirus 2 NEGATIVE NEGATIVE    Comment: (NOTE) SARS-CoV-2 target nucleic acids are NOT DETECTED. The SARS-CoV-2 RNA is generally detectable in upper and lower respiratory specimens during the acute phase of infection. The lowest concentration of SARS-CoV-2 viral copies this assay can detect is 250 copies / mL. A negative result does not preclude SARS-CoV-2 infection and should not be used as the sole basis for treatment or other patient management decisions.  A negative result may occur with improper specimen collection / handling, submission of specimen other than nasopharyngeal swab, presence of viral mutation(s) within the areas targeted by this assay, and inadequate number of viral copies (<250 copies / mL). A negative result must be combined with clinical observations, patient history, and epidemiological information. Fact Sheet for Patients:   StrictlyIdeas.no Fact Sheet for Healthcare Providers: BankingDealers.co.za This test is not yet approved or cleared  by the [place] FDA and has been authorized for detection and/or diagnosis of SARS-CoV-2 by FDA under an Emergency Use Authorization (EUA).  This EUA will remain in effect (meaning this test can be used) for the duration of the COVID-19 declaration under Section 564(b)(1) of the Act, 21 U.S.C. section 360bbb-3(b)(1), unless the authorization is terminated or revoked sooner. Performed at [facility], [place] [address] [place], [place] [address]   Urinalysis, Complete w Microscopic     Status: Abnormal   Collection Time: 12/13/19  7:20 AM  Result Value Ref Range   Color, Urine YELLOW YELLOW   APPearance  CLEAR CLEAR   Specific Gravity, Urine 1.013 1.005 - 1.030   pH 6.0 5.0 - 8.0   Glucose, UA NEGATIVE NEGATIVE mg/dL   Hgb urine dipstick NEGATIVE NEGATIVE   Bilirubin Urine NEGATIVE NEGATIVE   Ketones, ur NEGATIVE NEGATIVE mg/dL   Protein, ur NEGATIVE NEGATIVE mg/dL   Nitrite NEGATIVE NEGATIVE   Leukocytes,Ua NEGATIVE NEGATIVE   WBC, UA 0-5 0 - 5 WBC/hpf   Bacteria, UA RARE (A) NONE SEEN   Mucus PRESENT     Comment: Performed at [facility] [address] [place], [place] [address]  Urine rapid drug screen (hosp performed)not at [facility]     Status: Abnormal   Collection Time: 12/13/19  7:20 AM  Result Value Ref Range   Opiates NONE DETECTED  NONE DETECTED   Cocaine NONE DETECTED NONE DETECTED   Benzodiazepines NONE DETECTED NONE DETECTED   Amphetamines POSITIVE (A) NONE DETECTED   Tetrahydrocannabinol NONE DETECTED NONE DETECTED   Barbiturates NONE DETECTED NONE DETECTED    Comment: (NOTE) DRUG SCREEN FOR MEDICAL PURPOSES ONLY.  IF CONFIRMATION IS NEEDED FOR ANY PURPOSE, NOTIFY LAB WITHIN 5 DAYS. LOWEST DETECTABLE LIMITS FOR URINE DRUG SCREEN Drug Class                     Cutoff (ng/mL) Amphetamine and metabolites    1000 Barbiturate and metabolites    200 Benzodiazepine                 [id number] Tricyclics and metabolites     300 Opiates and metabolites        300 Cocaine and metabolites        300 THC                            50 Performed at [facility], [place] [address] [place], [place] [address]   Comprehensive metabolic panel     Status: Abnormal   Collection Time: 12/13/19  7:38 AM  Result Value Ref Range   Sodium 140 135 - 145 mmol/L   Potassium 3.5 3.5 - 5.1 mmol/L   Chloride 105 98 - 111 mmol/L   CO2 24 22 - 32 mmol/L   Glucose, Bld 112 (H) 70 - 99 mg/dL    Comment: Glucose reference range applies only to samples taken after fasting for at least 8 hours.   BUN 12 6 - 20 mg/dL   Creatinine, Ser 0.86 0.61 - 1.24 mg/dL    Calcium 8.9 8.9 - 10.3 mg/dL   Total Protein 6.7 6.5 - 8.1 g/dL   Albumin 4.0 3.5 - 5.0 g/dL   AST 39 15 - 41 U/L   ALT 32 0 - 44 U/L   Alkaline Phosphatase 76 38 - 126 U/L   Total Bilirubin 0.5 0.3 - 1.2 mg/dL   GFR calc non Af Amer >60 >60 mL/min   GFR calc Af Amer >60 >60 mL/min   Anion gap 11 5 - 15    Comment: Performed at [facility], [place] [address] [place], [place] [address]  Ethanol     Status: None   Collection Time: 12/13/19  7:38 AM  Result Value Ref Range   Alcohol, Ethyl (B) <10 <10 mg/dL    Comment: (NOTE) Lowest detectable limit for serum alcohol is 10 mg/dL. For medical purposes only. Performed at [facility], [place] [address], [place], [place]  [address]   CBC with Diff     Status: None   Collection Time: 12/13/19  7:38 AM  Result Value Ref Range   WBC 7.0 4.0 - 10.5 K/uL   RBC 5.25 4.22 - 5.81 MIL/uL   Hemoglobin 14.4 13.0 - 17.0 g/dL   HCT 44.6 39.0 - 52.0 %   MCV 85.0 80.0 - 100.0 fL   MCH 27.4 26.0 - 34.0 pg   MCHC 32.3 30.0 - 36.0 g/dL   RDW 13.2 11.5 - 15.5 %   Platelets 262 150 - 400 K/uL   nRBC 0.0 0.0 - 0.2 %   Neutrophils Relative % 49 %   Neutro Abs 3.5 1.7 - 7.7 K/uL   Lymphocytes Relative 34 %   Lymphs Abs 2.4 0.7 - 4.0 K/uL   Monocytes  Relative 13 %   Monocytes Absolute 0.9 0.1 - 1.0 K/uL   Eosinophils Relative 3 %   Eosinophils Absolute 0.2 0.0 - 0.5 K/uL   Basophils Relative 1 %   Basophils Absolute 0.1 0.0 - 0.1 K/uL   Immature Granulocytes 0 %   Abs Immature Granulocytes 0.01 0.00 - 0.07 K/uL    Comment: Performed at [facility], [place] [address], [place], [place] [address]    Blood Alcohol level:  Lab Results  Component Value Date   ETH <10 12/13/2019   ETH <10 123456    Metabolic Disorder Labs:  Lab Results  Component Value Date   HGBA1C 5.0 12/22/2016   MPG 97 12/22/2016   MPG 97 09/09/2015   Lab Results  Component Value Date   PROLACTIN 2.4 (L) 12/22/2016    PROLACTIN 8.0 09/09/2015   Lab Results  Component Value Date   CHOL 130 12/22/2016   TRIG 193 (H) 12/22/2016   HDL 35 (L) 12/22/2016   CHOLHDL 3.7 12/22/2016   VLDL 39 12/22/2016   LDLCALC 56 12/22/2016   LDLCALC 84 09/09/2015    Current Medications: Current Facility-Administered Medications  Medication Dose Route Frequency Provider Last Rate Last Admin  . acetaminophen (TYLENOL) tablet 650 mg  650 mg Oral Q6H PRN Anike, Adaku C, NP      . alum & mag hydroxide-simeth (MAALOX/MYLANTA) 200-200-20 MG/5ML suspension 30 mL  30 mL Oral Q4H PRN Anike, Adaku C, NP      . ARIPiprazole (ABILIFY) tablet 5 mg  5 mg Oral Daily Merlyn Lot E, NP      . ARIPiprazole ER (ABILIFY MAINTENA) injection 400 mg  400 mg Intramuscular Q28 days Merlyn Lot E, NP   400 mg at 12/13/19 1513  . benztropine (COGENTIN) tablet 1 mg  1 mg Oral BID Merlyn Lot E, NP      . hydrOXYzine (ATARAX/VISTARIL) tablet 25 mg  25 mg Oral TID PRN Anike, Adaku C, NP      . magnesium hydroxide (MILK OF MAGNESIA) suspension 30 mL  30 mL Oral Daily PRN Anike, Adaku C, NP      . traZODone (DESYREL) tablet 50 mg  50 mg Oral QHS PRN Anike, Adaku C, NP       Current Outpatient Medications  Medication Sig Dispense Refill  . ARIPiprazole ER (ABILIFY MAINTENA) 400 MG SRER injection Inject 2 mLs (400 mg total) into the muscle every 28 (twenty-eight) days. DUE APPROX 7/16 (Patient not taking: Reported on 08/12/2019) 1 each 11  . benztropine (COGENTIN) 1 MG tablet Take 1 tablet (1 mg total) by mouth 2 (two) times daily. (Patient not taking: Reported on 08/12/2019) 60 tablet 2  . haloperidol (HALDOL) 20 MG tablet Take 1 tablet (20 mg total) by mouth at bedtime. (Patient not taking: Reported on 08/12/2019) 30 tablet 2  . lithium carbonate (LITHOBID) 300 MG CR tablet Take 1 tablet (300 mg total) by mouth every 12 (twelve) hours. (Patient not taking: Reported on 08/12/2019) 60 tablet 2  . traZODone (DESYREL) 50 MG tablet Take 1 tablet (50 mg  total) by mouth at bedtime as needed for sleep. (Patient not taking: Reported on 08/12/2019) 30 tablet 1   PTA Medications: (Not in a hospital admission)  Musculoskeletal: Strength & Muscle Tone: within normal limits Gait & Station: normal Patient leans: N/A  Psychiatric Specialty Exam:   Blood pressure 115/82, pulse 85, temperature 98.3 F (36.8 C), temperature source Oral, resp. rate 18, SpO2 96 %.There is no  height or weight on file to calculate BMI.  General Appearance: Casual and Guarded  Eye Contact::  Good  Speech:  Clear and Coherent and Normal Rate  Volume:  Normal  Mood:  Irritable and "because I should not be here"  Affect:  Blunt and Congruent  Thought Process:  Coherent and Descriptions of Associations: Intact  Orientation:  Full (Time, Place, and Person)  Thought Content:  Logical  Suicidal Thoughts:  No  Homicidal Thoughts:  No  Memory:  Immediate;   Good Recent;   Good Remote;   Good  Judgement:  Fair  Insight:  Fair  Psychomotor Activity:  Normal  Concentration:  Good  Recall:  Good  Fund of Knowledge:Fair  Language: Good  Akathisia:  NA  Handed:  Right  AIMS (if indicated):     Assets:  Social Support  Sleep:     Cognition: WNL  ADL's:  Intact   Treatment Plan Summary: Daily contact with patient to assess and evaluate symptoms and progress in treatment and Medication management  Recommend 24 hours observation at [facility] for monitoring, can restart medication at this time.  EKG: QT/QTC 375/441 WNL, psychotropic medications started at emergency department on 5/13.  Can reassess patient in the AM for discharge.   SW has already coordinated outpatient follow-up appointment with [facility] for 5/19 for continued medication management.   The plan was discussed with the patient and his mother with concordance.    Observation Level/Precautions:  15 minute checks Laboratory:  labs already completed at [facility] ED 5/13.  No additional labs needed at this  time.  Psychotherapy:   Medications:   Abilify [place] 400mg  IM q28 days for mood stabilization Abilify 5mg  po daily for mood stabilization Cogentin 1mg  po BID for EPS Consultations:   Discharge Concerns:   Estimated LOS:24 hours observation Other:      Mallie Darting, NP 5/13/20213:51 PM

## 2019-12-13 NOTE — ED Notes (Signed)
Pt denies any thoughts of SI or HI at this time. Pt is calm and cooperative.

## 2019-12-13 NOTE — Progress Notes (Signed)
Patient ID: Justin Chaney, male   DOB: 02-08-1996, 24 y.o.   MRN: [id number] Pt A&O x 4, pt under IVC, presents with complaint of IV meth use, very sleepy, nodding out during assessment with TTS and NP.  As per NP Ada pt to transfer to [facility] for medical clearance.  Skin search completed, monitoring for safety.  [employer] dispatch requested at appprox. 3.40 am this morning.

## 2019-12-13 NOTE — Discharge Instructions (Signed)
For your mental health needs, you are advised to follow up with [facility].  Call them at your earliest opportunity to schedule an appointment:       [facility] [address]      [place], [place] [address]      [phone]      Crisis number: [phone]

## 2019-12-13 NOTE — BH Assessment (Signed)
Assessment Note  Justin Chaney is an 24 y.o. male. Pt presents to [facility] under IVC by his mother accompanied by [employer]. Pt states he was having suicidal thoughts of wanting to cut himself or overdose on drugs (heroin). Pt reports multiple SI attempts in the past the last one he attempted to cut himself last year. Pt denies current HI and self harm but endorses current AVH of hearing voices and spirits telling him he is worthless and seeing demons. Pt reports daily hallucinations. Pt reports he is dealing with a lot, feels alone and has no one but himself. Pt reports he has not slept in over a week and has not ate any food all week due to drug use. Pt reports abusing crystal meth daily and a few grams earlier today. Pt reports symptoms of depression: hopelessness, worthlessness, anxiety,isolation, tearfulness, insomnia. Pt reports he also has been non compliant with medications has not taken them in over a month and his last provider was [facility]. Pt states he was taking Lithium, Cogentin and Abilify. Per pt chart pt has history of multiple hospitilizations, last assessed on 07/2019 for similar presentation. Pt reports currently living with relatives, on disability and feels he has no support. At this time pt can not contract for safety. Pt denies access to weapons or violence.   Per IVC: [name] is diagnosed with schizoaffective disorder. Mother states respondent has not taken medications in at least a month. He has not bathed in at least 3 weeks. [name] is stating people can see through his windows and are talking about him and his private parts. Pupils have been dilated and is sweating for a week. [name] told mother tonight if something happens to him remember he loves them. In the past this has been language respondent used before he attempts suicide   Pt, alert and oriented x4. Pt speaks in a clear tone, at moderate volume and normal pace. Motor behavior appears slightly restless. Eye contact is  good. Pt's mood is depressed and affect is congruent with mood. Thought process is coherent and relevant. There is no indicationbyPt's current behavior that heis currently responding to internal stimuli or experiencing delusional thought content. He was cooperative throughout assessment. Pt can not contract for safety at this time and willing to seek inpatient treatment.    Diagnosis:   F25.0 Schizoaffective disorder, Bipolar type F15.20 Amphetamine-type substance use disorder, Severe F11.20 Opioid use disorder, Severe  Past Medical History:  Past Medical History:  Diagnosis Date  . Asthma   . Bipolar disorder ([place])   . 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 Depression   . Homelessness   . Schizophrenia ([place])   . Seizures ([place])    childhood seizures  . Suicidal behavior   . Suicidal intent     Past Surgical History:  Procedure Laterality Date  . APPENDECTOMY    . SHUNT REMOVAL    . TEE WITHOUT CARDIOVERSION N/A 08/14/2019   Procedure: TRANSESOPHAGEAL ECHOCARDIOGRAM (TEE);  Surgeon: Pixie Casino, MD;  [facility] END[facility];  Service: Cardiovascular;  Laterality: N/A;  . TONSILLECTOMY    . VENTRICULO-PERITONEAL SHUNT PLACEMENT / LAPAROSCOPIC INSERTION PERITONEAL CATHETER      Family History:  Family History  Problem Relation Age of Onset  . Hypertension Mother   . Mental illness Neg Hx     Social History:  reports that he has been smoking cigars. He has been smoking about 0.00 packs per day. He has never used smokeless tobacco. He  reports that he does not drink alcohol or use drugs.  Additional Social History:  Alcohol / Drug Use Pain Medications: see MAR Prescriptions: see MAR Over the Counter: see MAR  CIWA: CIWA-Ar BP: 124/73 Pulse Rate: (!) 117 COWS:    Allergies:  Allergies  Allergen Reactions  . Amoxicillin Hives and Other (See Comments)    CHILDHOOD ALLERGY - Per [facility] records, tolerates Ancef and Rocephin Has patient had a  PCN reaction causing immediate rash, facial/tongue/throat swelling, SOB or lightheadedness with hypotension: YES Has patient had a PCN reaction causing severe rash involving mucus membranes or skin necrosis: No Has patient had a PCN reaction that required hospitalization No    Home Medications:  Medications Prior to Admission  Medication Sig Dispense Refill  . ARIPiprazole ER (ABILIFY MAINTENA) 400 MG SRER injection Inject 2 mLs (400 mg total) into the muscle every 28 (twenty-eight) days. DUE APPROX 7/16 (Patient not taking: Reported on 08/12/2019) 1 each 11  . benztropine (COGENTIN) 1 MG tablet Take 1 tablet (1 mg total) by mouth 2 (two) times daily. (Patient not taking: Reported on 08/12/2019) 60 tablet 2  . haloperidol (HALDOL) 20 MG tablet Take 1 tablet (20 mg total) by mouth at bedtime. (Patient not taking: Reported on 08/12/2019) 30 tablet 2  . lithium carbonate (LITHOBID) 300 MG CR tablet Take 1 tablet (300 mg total) by mouth every 12 (twelve) hours. (Patient not taking: Reported on 08/12/2019) 60 tablet 2  . traZODone (DESYREL) 50 MG tablet Take 1 tablet (50 mg total) by mouth at bedtime as needed for sleep. (Patient not taking: Reported on 08/12/2019) 30 tablet 1    OB/GYN Status:  No LMP for male patient.  General Assessment Data Location of Assessment: [facility] Assessment Services TTS Assessment: In system Is this a Tele or Face-to-Face Assessment?: Face-to-Face Is this an Initial Assessment or a Re-assessment for this encounter?: Initial Assessment Patient Accompanied by:: Other Language Other than English: No Living Arrangements: Other (Comment) What gender do you identify as?: Male Marital status: Single Pregnancy Status: No Living Arrangements: Other relatives Can pt return to current living arrangement?: Yes Admission Status: Involuntary Petitioner: Family member Is patient capable of signing voluntary admission?: Yes Referral Source: Other     Crisis Care Plan Living  Arrangements: Other relatives Legal Guardian: Other:(self)  Education Status Is patient currently in school?: No     Risk to Others within the past 6 months Homicidal Ideation: No Does patient have any lifetime risk of violence toward others beyond the six months prior to admission? : No Thoughts of Harm to Others: No Current Homicidal Intent: No Current Homicidal Plan: No Access to Homicidal Means: No Identified Victim: none History of harm to others?: No Assessment of Violence: None Noted Violent Behavior Description: none Does patient have access to weapons?: No Criminal Charges Pending?: No Does patient have a court date: No Is patient on probation?: No           ADLScreening [facility]) Patient's cognitive ability adequate to safely complete daily activities?: Yes Patient able to express need for assistance with ADLs?: Yes Independently performs ADLs?: Yes (appropriate for developmental age)  Prior Inpatient Therapy Prior Inpatient Therapy: Yes     ADL Screening (condition at time of admission) Patient's cognitive ability adequate to safely complete daily activities?: Yes Patient able to express need for assistance with ADLs?: Yes Independently performs ADLs?: Yes (appropriate for developmental age)  Disposition: Talbot Grumbling, FNP recommends inpatient treatment. Per [facility] pt sent to [facility] for medical clearance no appropriate beds at this time. TTS to seek placement.    On Site Evaluation by:  Lovie Macadamia Reviewed with Physician:  Talbot Grumbling, FNP  Gloriajean Dell Izzak Fries 12/13/2019 2:05 AM

## 2019-12-13 NOTE — ED Notes (Addendum)
This pts mother  contacted me and is asking about his medication and refills for his psych meds. Mother reports that the pt does not have any medication at this time. Pt is also reporting that he is still hearing voices.Pt continues to deny SI/HI.  Psych NP made aware.

## 2019-12-13 NOTE — H&P (Signed)
Behavioral Health Medical Screening Exam  Justin Chaney is an 24 y.o. male who presents under IVC brought in by [employer]. Per IVC, "[name] is diagnosed with schizoaffective disorder. Mother states respondent has not taken medications in at least a month. He has not bathed in at least 3 weeks. [name] is stating people can see through his windows and are talking about him and his private parts. Pupils have been dilated and is sweating for a week. [name] told mother tonight if something happens to him remember he loves them. In the past this has been language respondent used before he attempts suicide"             [name] reports he did not want to come in to [facility] and had told his mother he was okay but she did not listen. Pt states "Don't know myself, don't enjoy life, never enjoyed life, always been an outsider". Pt is not specific about his stressors but states "life in general is stressful". He reports he's being using crack cocaine for 2 weeks in a row in an attempt to overdose. 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 He endorses SI. Reports self harm in the past, last time was last year. He enordses AVH, states he see devils and demons through walls and stuff and they say "bad stuff" to him. Pt reports paranoia states he can hear people talking about him. He reports a history of suicidal attempt last year by hanging. Pt denies HI. Pt denies access to guns or weapons. He gets his outpatient services from [facility]. Pt states he does not know if he's on any medication and has not taken any in a longtime. Reports he has not slept in 2 weeks and has a poor appetite. Pt states he is on disability and lives with family members.  During evaluation pt is sitting; he is alert to person and place; cooperative; and mood is depressed/anxious congruent with affect. Pt is speaking in a clear tone at moderate volume, and normal pace; with poor eye contact.  His thought process is coherent and  relevant; There is no indication that he is currently responding to internal/external stimuli or experiencing delusional thought content. Pt's insight is shallow, judgement is poor and impulse control is fair at this time.    Total Time spent with patient: 30 minutes  Psychiatric Specialty Exam: Physical Exam  Constitutional: He appears well-developed.  HENT:  Head: Normocephalic.  Eyes: Pupils are equal, round, and reactive to light.  Respiratory: Effort normal.  Musculoskeletal:        General: Normal range of motion.     Cervical back: Normal range of motion.  Neurological: He is alert.  Skin: Skin is warm and dry.  Psychiatric: His speech is normal and behavior is normal. His mood appears anxious. His affect is blunt. Cognition and memory are normal. He expresses impulsivity. He exhibits a depressed mood. He expresses suicidal ideation.    Review of Systems  Psychiatric/Behavioral: Positive for dysphoric mood, hallucinations, sleep disturbance and suicidal ideas. Negative for decreased concentration and self-injury. The patient is nervous/anxious. The patient is not hyperactive.   All other systems reviewed and are negative.   Blood pressure 124/73, pulse (!) 117, temperature 98.3 F (36.8 C), temperature source Oral, resp. rate 20, SpO2 99 %.There is no height or weight on file to calculate BMI.  General Appearance: Casual  Eye Contact:  Poor  Speech:  Normal Rate  Volume:  Decreased  Mood:  Anxious, Depressed, Dysphoric, Hopeless and Irritable  Affect:  Congruent and Depressed  Thought Process:  Coherent and Descriptions of Associations: Intact  Orientation:  Full (Time, Place, and Person)  Thought Content:  Hallucinations: Auditory Visual and Paranoid Ideation  Suicidal Thoughts:  Yes.  with intent/plan  Homicidal Thoughts:  No  Memory:  Recent;   Good  Judgement:  Poor  Insight:  Shallow  Psychomotor Activity:  Normal  Concentration: Concentration: Good  Recall:   [place] of Knowledge:Good  Language: Good  Akathisia:  No  Handed:  Right  AIMS (if indicated):     Assets:  Agricultural consultant Housing  Sleep:       Musculoskeletal: Strength & Muscle Tone: within normal limits Gait & Station: normal Patient leans: N/A  Blood pressure 124/73, pulse (!) 117, temperature 98.3 F (36.8 C), temperature source Oral, resp. rate 20, SpO2 99 %.  Recommendations:  Based on my evaluation the patient does not appear to have an emergency medical condition.   Disposition: Recommend psychiatric Inpatient admission when medically cleared. Supportive therapy provided about ongoing stressors.    Mliss Fritz, NP 12/13/2019, 3:00 AM

## 2019-12-13 NOTE — BH Assessment (Signed)
[facility] Assessment Progress Note  Per Hampton Abbot, MD, this pt would benefit from admission to the [facility] Observ[facility] Unit at this time.  Pt presents under IVC initiated by pt's mother, which Dr Dwyane Dee has rescinded; pt is now under voluntary status.  Jasmine reports that a bed will be available for pt later today, and she will contact this Probation officer with an assignment.  She recommends that I have pt sign consents.  Pt has signed Voluntary Admission and Consent for Treatment, as well as Consent to Release Information to [facility] only, and signed forms have been faxed to [facility].  Please note that earlier today Beverly Sessions had called me with appointment for the pt on Wednesday, 12/19/2019 at 08:30.  This appointment will be retained for now for inclusion in pt's discharge instructions when the time comes.  Pt's nurse, Nena Jordan, has been notified, and agrees to send original paperwork along with pt via [employer], and to call report to [phone].  Jalene Mullet, [place] Coordinator [phone]

## 2019-12-13 NOTE — ED Provider Notes (Signed)
[facility] DEPT Provider Note   CSN: [id number] Arrival date & time: 12/13/19  0700     History Chief Complaint  Patient presents with  . Psychiatric Evaluation    Justin Chaney is a 24 y.o. male.  Patient with history of schizoaffective disorder, VP shunt, suicidal ideation/attempt, substance abuse disorder --presents from behavioral health where he was evaluated this morning for suicidal ideations, hallucinations.  Patient reports being off his medicine and wants help getting back on it.  He states that he has been off for a long time.  He denies any recent health problems.  He denies headache and vomiting.  He denies any fevers or infectious type symptoms.  Coronavirus testing obtained prior to arrival in the ED, was negative.  Currently patient has no complaints.        Past Medical History:  Diagnosis Date  . Asthma   . Bipolar disorder ([place])   . 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 Depression   . Homelessness   . Schizophrenia ([place])   . Seizures ([place])    childhood seizures  . Suicidal behavior   . Suicidal intent     Patient Active Problem List   Diagnosis Date Noted  . Schizophrenia ([place]) 12/13/2019  . Sepsis with acute organ dysfunction without septic shock ([place])   . Staphylococcus aureus bacteremia 08/12/2019  . Thrombocytopenia ([place]) 08/12/2019  . Polysubstance abuse ([place]) 08/12/2019  . IVDU (intravenous drug user) 08/12/2019  . 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 Transaminitis 08/12/2019  . Hyperbilirubinemia 08/12/2019  . SIRS (systemic inflammatory response syndrome) ([place]) 08/11/2019  . Normocytic anemia 08/11/2019  . Schizoaffective disorder ([place]) 01/10/2019  . Cocaine use disorder, severe, in early remission ([place]) 12/21/2016  . Amphetamine abuse in remission ([place]) 12/21/2016  . ADHD (attention deficit hyperactivity disorder) 12/21/2016  . 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 Tobacco use disorder  12/21/2016  . Tobacco use 10/06/2016  . Homicidal ideations 09/16/2016  . Suicidal ideation   . Hallucinations   . Homicidal ideation   . Suicidal thoughts   . Cannabis use disorder, moderate, in sustained remission ([place]) 11/07/2015  . Depression 10/29/2015  . Intermittent explosive disorder 09/05/2015  . Schizoaffective disorder, bipolar type ([place]) 09/04/2015  . Malingering 06/05/2015  . Antisocial personality disorder ([place]) 06/03/2015  . Cocaine abuse ([place]) 06/03/2015    Past Surgical History:  Procedure Laterality Date  . APPENDECTOMY    . SHUNT REMOVAL    . TEE WITHOUT CARDIOVERSION N/A 08/14/2019   Procedure: TRANSESOPHAGEAL ECHOCARDIOGRAM (TEE);  Surgeon: Pixie Casino, MD;  Location: [facility] ENDOSCOPY;  Service: Cardiovascular;  Laterality: N/A;  . TONSILLECTOMY    . VENTRICULO-PERITONEAL SHUNT PLACEMENT / LAPAROSCOPIC INSERTION PERITONEAL CATHETER         Family History  Problem Relation Age of Onset  . Hypertension Mother   . Mental illness Neg Hx     Social History   Tobacco Use  . Smoking status: Current Every Day Smoker    Packs/day: 0.00    Types: Cigars  . Smokeless tobacco: Never Used  Substance Use Topics  . Alcohol use: No  . Drug use: Yes    Types: Other-see comments, Amphetamines    Home Medications Prior to Admission medications   Medication Sig Start Date End Date Taking? Authorizing Provider  ARIPiprazole ER (ABILIFY MAINTENA) 400 MG SRER injection Inject 2 mLs (400 mg total) into the muscle every 28 (twenty-eight) days. DUE APPROX 7/16 Patient not taking: Reported  on 08/12/2019 01/16/19   [name], MD  benztropine (COGENTIN) 1 MG tablet Take 1 tablet (1 mg total) by mouth 2 (two) times daily. Patient not taking: Reported on 08/12/2019 01/16/19   [name], MD  haloperidol (HALDOL) 20 MG tablet Take 1 tablet (20 mg total) by mouth at bedtime. Patient not taking: Reported on 08/12/2019 01/16/19   [name], MD  lithium carbonate (LITHOBID)  300 MG CR tablet Take 1 tablet (300 mg total) by mouth every 12 (twelve) hours. Patient not taking: Reported on 08/12/2019 01/16/19   [name], MD  traZODone (DESYREL) 50 MG tablet Take 1 tablet (50 mg total) by mouth at bedtime as needed for sleep. Patient not taking: Reported on 08/12/2019 01/16/19   [name], MD    Allergies    Amoxicillin  Review of Systems   Review of Systems  Constitutional: Negative for fever.  HENT: Negative for rhinorrhea and sore throat.   Eyes: Negative for redness.  Respiratory: Negative for cough.   Cardiovascular: Negative for chest pain.  Gastrointestinal: Negative for abdominal pain, diarrhea, nausea and vomiting.  Genitourinary: Negative for dysuria.  Musculoskeletal: Negative for myalgias.  Skin: Negative for rash.  Neurological: Negative for headaches.  Psychiatric/Behavioral: Positive for behavioral problems, hallucinations and suicidal ideas.    Physical Exam Updated Vital Signs BP 140/74 (BP Location: Right Arm)   Pulse (!) 110   Temp 98.3 F (36.8 C) (Oral)   Resp 20   SpO2 96%   Physical Exam Vitals and nursing note reviewed.  Constitutional:      Appearance: He is well-developed.  HENT:     Head: Normocephalic and atraumatic.  Eyes:     General:        Right eye: No discharge.        Left eye: No discharge.     Conjunctiva/sclera: Conjunctivae normal.  Cardiovascular:     Rate and Rhythm: Regular rhythm. Tachycardia present.     Heart sounds: Normal heart sounds.  Pulmonary:     Effort: Pulmonary effort is normal.     Breath sounds: Normal breath sounds.  Abdominal:     Palpations: Abdomen is soft.     Tenderness: There is no abdominal tenderness.  Musculoskeletal:     Cervical back: Normal range of motion and neck supple.  Skin:    General: Skin is warm and dry.  Neurological:     Mental Status: He is alert.  Psychiatric:        Attention and Perception: Attention normal.        Mood and Affect: Mood normal.         Behavior: Behavior normal.        Thought Content: Thought content includes suicidal ideation. Thought content includes suicidal plan.     ED Results / Procedures / Treatments   Labs (all labs ordered are listed, but only abnormal results are displayed) Labs Reviewed  URINALYSIS, COMPLETE (UACMP) WITH MICROSCOPIC - Abnormal; Notable for the following components:      Result Value   Bacteria, UA RARE (*)    All other components within normal limits  RAPID URINE DRUG SCREEN, HOSP PERFORMED - Abnormal; Notable for the following components:   Amphetamines POSITIVE (*)    All other components within normal limits  COMPREHENSIVE METABOLIC PANEL - Abnormal; Notable for the following components:   Glucose, Bld 112 (*)    All other components within normal limits  SARS CORONAVIRUS 2 BY RT PCR (HOSPITAL ORDER, PERFORMED  [facility])  ETHANOL  CBC WITH DIFFERENTIAL/PLATELET  HEMOGLOBIN A1C  MAGNESIUM  LIPID PANEL  TSH  PROLACTIN    EKG None  Radiology No results found.  Procedures Procedures (including critical care time)  Medications Ordered in ED Medications  acetaminophen (TYLENOL) tablet 650 mg (has no administration in time range)  alum & mag hydroxide-simeth (MAALOX/MYLANTA) 200-200-20 MG/5ML suspension 30 mL (has no administration in time range)  magnesium hydroxide (MILK OF MAGNESIA) suspension 30 mL (has no administration in time range)  hydrOXYzine (ATARAX/VISTARIL) tablet 25 mg (25 mg Oral Not Given 12/13/19 0300)  traZODone (DESYREL) tablet 50 mg (50 mg Oral Not Given 12/13/19 0300)    ED Course  I have reviewed the triage vital signs and the nursing notes.  Pertinent labs & imaging results that were available during my care of the patient were reviewed by me and considered in my medical decision making (see chart for details).  Patient seen and examined. He is under IVC. Work-up initiated at [facility]. Will follow-up labs while placement is sought.  Reviewed notes from [facility].  Patient does not have any compelling symptoms to suggest VP shunt malfunction at this point.  Vital signs reviewed and are as follows: BP 140/74 (BP Location: Right Arm)   Pulse (!) 110   Temp 98.3 F (36.8 C) (Oral)   Resp 20   SpO2 96%   9:14 AM Reviewed lab work-up. Pt is medically cleared.     MDM Rules/Calculators/A&P                      Pending placement. Under IVC.   Final Clinical Impression(s) / ED Diagnoses Final diagnoses:  Suicidal ideation    Rx / DC Orders ED Discharge Orders    None       Carlisle Cater, PA-C 12/13/19 0915    Sherwood Gambler, MD 12/13/19 [phone]

## 2019-12-13 NOTE — ED Notes (Signed)
Pt requesting to leave. Pt stating " when are they going to discharge me? I dont need to be here" Pt informed that it is psych's decision on dispo. Pt informed that he still remain under IVC.

## 2019-12-13 NOTE — ED Notes (Signed)
Meal Tray ordered for pt at this time.

## 2019-12-13 NOTE — [facility] Suicide Risk Assessment (Cosign Needed Addendum)
Suicide Risk Assessment  Discharge Assessment   [facility] Discharge Suicide Risk Assessment   Principal Problem: <principal problem not specified> Discharge Diagnoses: Active Problems:   Schizoaffective disorder, bipolar type (Justin Chaney)   Schizophrenia ([place])   Justin Chaney, 24 y.o., male patient seen via telepsych by this provider and Dr. Dwyane Dee and chart reviewed on 12/13/19.  On evaluation Justin Chaney reports urrently he is alert, attentive but guarded and irritable.  States he does not know why his mom had him hospitalized. Per records review, nursing staff noted patient to be calm and cooperative today, no safety concerns observed.  He contracts for safety, denies suicidal ideations or homicidal ideations or intent. He denies hallucinations, no delusions are expressed.  He does not appear internally preoccupied.  Several unsuccessful attempts were made to contact the patient's mother for collateral, Justin Chaney at  [phone].    Total Time spent with patient: 30 minutes  Musculoskeletal: Strength & Muscle Tone: within normal limits Gait & Station: normal Patient leans: N/A  Psychiatric Specialty Exam:   Blood pressure 115/82, pulse 85, temperature 98.3 F (36.8 C), temperature source Oral, resp. rate 18, SpO2 96 %.There is no height or weight on file to calculate BMI.  General Appearance: Casual and Guarded  Eye Contact::  Good  Speech:  Clear and Coherent and Normal Rate  Volume:  Normal  Mood:  Irritable and "because I should not be here"  Affect:  Blunt and Congruent  Thought Process:  Coherent and Descriptions of Associations: Intact  Orientation:  Full (Time, Place, and Person)  Thought Content:  Logical  Suicidal Thoughts:  No  Homicidal Thoughts:  No  Memory:  Immediate;   Good Recent;   Good Remote;   Good  Judgement:  Fair  Insight:  Fair  Psychomotor Activity:  Normal  Concentration:  Good  Recall:  Good  Fund of Knowledge:Fair  Language: Good   Akathisia:  NA  Handed:  Right  AIMS (if indicated):     Assets:  Social Support  Sleep:     Cognition: WNL  ADL's:  Intact   Mental Status Per Nursing Assessment::   On Admission:  Suicidal ideation indicated by patient  Demographic Factors:  Male  Loss Factors: NA  Historical Factors: Impulsivity  Risk Reduction Factors:   Living with another person, especially a relative and Positive social support  Continued Clinical Symptoms:  Previous Psychiatric Diagnoses and Treatments  Cognitive Features That Contribute To Risk:  Closed-mindedness    Suicide Risk:  Mild:  Suicidal ideation of limited frequency, intensity, duration, and specificity.  There are no identifiable plans, no associated intent, mild dysphoria and related symptoms, good self-control (both objective and subjective assessment), few other risk factors, and identifiable protective factors, including available and accessible social support.    Plan Of Care/Follow-up recommendations: Plan- As per above assessment , there are no current grounds for involuntary commitment at this time. Dr. Dwyane Dee has agreed to rescind the patient's IVC and he will be psych cleared.   Patient is not currently interested in inpatient services, but expresses agreement to continue outpatient treatment. He is currently followed by Beverly Sessions, SW will initiate contact for outpatient follow-up.  We reviewed the importance of medication compliance.   Addendum: Just received notification from Justin Ferris, RN caring for patient in the ED that patient now endorses dysphoric mood in the setting of worsening audible hallucinations.  I am also informed that his mother will not allow him to return home until he restarts  antipsychotic medications.  The patient's IVC was rescinded but he is now requesting voluntary admission for mood stabilization.  Information shared with SW who will seek inpatient placement.   Complete EKG, pending results, can  start aripiprazole injection.    Justin Darting, NP 12/13/2019, 11:01 AM

## 2019-12-13 NOTE — ED Notes (Signed)
Per psych NP Merlyn Lot NP: Pt to get medication at [facility]

## 2019-12-13 NOTE — Plan of Care (Signed)
[facility] Observation Crisis Plan  Reason for Crisis Plan:  Crisis Stabilization   Plan of Care:  Referral for Substance Abuse  Family Support:      Current Living Environment:  Living Arrangements: Other relatives  Insurance:   Hospital Account    Name Acct ID Class Status Primary Coverage   Justin Chaney, Justin Chaney [id number] [place]        Guarantor Account (for Hospital Account [id number])    Name Relation to Pt Service Area Active? Acct Type   Justin Chaney Self [facility] Yes Behavioral Health   Address Phone       [place], [place] [address] [phone])          Coverage Information (for Hospital Account [id number])    F/O Payor/Plan Precert #   [place]/DD/[place] #   [place], [place] [id number]   Address Phone   [address] [place], [place] [address] [phone]      Legal Guardian:  Legal Guardian: Other:(self)  Primary Care Provider:  Patient, No Pcp Per  Current Outpatient Providers:  None  Psychiatrist:  Name of Psychiatrist: [facility]  Counselor/Therapist:  Name of Therapist: none  Compliant with Medications:  No  Additional Information:   Jesusita Oka 5/13/20216:40 AM

## 2019-12-13 NOTE — ED Notes (Signed)
Pts mother stated that the patient is reporting constant voices and command hallucinations. Mother reports that the patient has been expressing SI to her due to the voices. Mother reports that while he has been here in the ED, he called and expressed vague SI.

## 2019-12-13 NOTE — ED Triage Notes (Signed)
PER [employer]: Pt is coming from [facility] with a c/o IVC by his mother. Pt is schizoaffective and not taking his medications. Pt attempted suicide in the past.

## 2019-12-14 NOTE — Progress Notes (Signed)
Patient ID: Justin Chaney, male   DOB: 1995-08-20, 24 y.o.   MRN: [id number]  12/14/2019 at 7:30 AM Observation unit progress note  24 year old male, single, no children, lives with his mother. 24 year old male, presented with suicidal ideations and reported plans to overdose on heroin or cut his wrists.  Endorsed depression.  Also endorsed psychotic symptoms.  Today presents as fair historian .  He reports "I was doing well, I was clean for years".  He reports he relapsed about 2 to 3 weeks ago on crystal methamphetamine.  He states he has been struggling with worsening depression and endorses suicidal ideations.  He states that prior to admission "I tried to hang myself myself but I was too high so I could not".  He also reports he was considering overdosing.  He also reports auditory hallucinations.  Describes seeing "demons", " spirits", and also endorses auditory hallucinations of a demeaning/critical nature telling him that he is "worthless".  Of note, he reports he had been experiencing some psychotic symptoms prior to his relapse but that hallucinations have worsened significantly since he relapsed. He was not taking psychiatric medications recently  Chart review indicates a prior hospitalization in October 2020 at another facility for similar presentation-  suicidal ideations, auditory hallucinations, relapse on stimulant.   He has been diagnosed with schizoaffective disorder.  He also has a history of stimulant use disorder.  At the time was managed with Abilify.  As per staff has been irritable and easily agitated/frustrated but no overt disruptive or threatening behaviors.   Labs reviewed-admission UDS positive for amphetamines.  BAL negative.  MSE -presents alert, attentive, fair eye contact, irritable and dysphoric.  Presents guarded and stated that he felt questions asked were "trick questions" describes mood as depressed.  Affect restricted and irritable.  No thought disorder is  noted at this time.  He endorses auditory and visual hallucinations as described above.  He endorses ongoing suicidal ideations, although currently does not endorse specific plan or intention.  Denies homicidal ideations.  Patient was restarted on Abilify at 5 mg daily and on Abilify Maintena 400 mg IM.  Assessment and plan-based on current presentation patient merits inpatient psychiatric admission. I reviewed with treatment team/CSW  Gabriel Earing , MD

## 2019-12-14 NOTE — Progress Notes (Signed)
Patient has been in room guarded, irritable, yelling at staff each time safety check is performed. Alert and oriented and not willing to disclose his feelings. Currently no self harm behaviors noted. Patient received medications (Vistaril and Trazodone) and emotional support provided. Behavioral expectations explained: patient agreed to remain compliant with nursing services. Safety precautions reinforced.

## 2019-12-14 NOTE — Progress Notes (Signed)
  COVID-19 Daily Checkoff  Have you had a fever (temp > 37.80C/100F)  in the past 24 hours?  No  If you have had runny nose, nasal congestion, sneezing in the past 24 hours, has it worsened? No  COVID-19 EXPOSURE  Have you traveled outside the state in the past 14 days? No  Have you been in contact with someone with a confirmed diagnosis of COVID-19 or PUI in the past 14 days without wearing appropriate PPE? No  Have you been living in the same home as a person with confirmed diagnosis of COVID-19 or a PUI (household contact)? No  Have you been diagnosed with COVID-19? No               D:  Patient laying in room eating breakfast in the dark. RN attempted to turn on lights to room and pt demanded lights to be turned back off. Allowed RN to question him in the dark. Sarcastic at times during brief interview. Returned with MD to complete assessment. Pt remarked to MD, "You're trying to trick me". Patient is very suspicious of others. Patient reports feeling suicidal "here and there. I just want to get back on my meds. I always see things but I started using crystal meth about 2 weeks ago and every since then, things got worse. I started seeing shadows, spirits and demons. I was too high on crystal meth when I was gonna cut or hang myself. I don't know, I was really high at the time. Patient refused vitals this morning. Patient stated, "I'm refusing".   Patient denies any physical complaints when asked. Easily agitated and gets frustrated easily. Patient denies intent to harm self or others when asked. Patient denies hallucinations at current. Patient took morning medications without difficulty or resistance.   A: Support and encouragement provided. Routine safety checks conducted every 15 minutes per unit protocol. Encouraged patient to notify staff if thoughts of harm toward self or others arise. He verbalized agreement.  R: Patient remains safe at this time, patient verbally contracts for  safety. Will continue to monitor.

## 2019-12-15 DIAGNOSIS — F250 Schizoaffective disorder, bipolar type: Secondary | ICD-10-CM

## 2019-12-15 DIAGNOSIS — F1510 Other stimulant abuse, uncomplicated: Secondary | ICD-10-CM

## 2019-12-15 NOTE — Progress Notes (Addendum)
[facility] MD Progress Note  12/15/2019 5:57 PM Justin Chaney  MRN:  [id number] Subjective:  "I not there yet." Principal Problem: Schizoaffective disorder, bipolar type ([place]) Diagnosis: Principal Problem:   Schizoaffective disorder, bipolar type ([place]) Active Problems:   Amphetamine abuse ([place])   Schizoaffective disorder ([place])  Total Time spent with patient: 30 minutes   Client denies hallucinations along with suicidal/homicidal ideations his sleep is "not good" and his appetite is "good."  He did receive services through the act team [facility] in the past, he reports he "dropped them."  He lives with his mother and he reports that it is going "pretty good".  Long history of getting upset with his mother resulting in arguments or altercations and coming to the emergency department to de-escalate.  Recommended discharge to the client who was adamant that he needed 1 more day prior to discharging as he did not feel stable enough.  Discussed him be involved with the discharge plan for tomorrow and continuing care at [facility].  Dr. Darleene Cleaver reviewed this client in person and concurs with the plan.   HPI per TTS:  24 year old male, single, no children, lives with his mother. 24 year old male, presented with suicidal ideations and reported plans to overdose on heroin or cut his wrists.  Endorsed depression.  Also endorsed psychotic symptoms.   Today presents as fair historian .  He reports "I was doing well, I was clean for years".  He reports he relapsed about 2 to 3 weeks ago on crystal methamphetamine.  He states he has been struggling with worsening depression and endorses suicidal ideations.  He states that prior to admission "I tried to hang myself myself but I was too high so I could not".  He also reports he was considering overdosing.  He also reports auditory hallucinations.  Describes seeing "demons", " spirits", and also endorses auditory hallucinations of a demeaning/critical nature telling him  that he is "worthless".  Of note, he reports he had been experiencing some psychotic symptoms prior to his relapse but that hallucinations have worsened significantly since he relapsed. He was not taking psychiatric medications recently   Chart review indicates a prior hospitalization in October 2020 at another facility for similar presentation-  suicidal ideations, auditory hallucinations, relapse on stimulant.   He has been diagnosed with schizoaffective disorder.  He also has a history of stimulant use disorder.  At the time was managed with Abilify.  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   Depression    Homelessness    Schizophrenia ([place])    Seizures ([place])    childhood seizures   Suicidal behavior    Suicidal intent     Past Surgical History:  Procedure Laterality Date   APPENDECTOMY     SHUNT REMOVAL     TEE WITHOUT CARDIOVERSION N/A 08/14/2019   Procedure: TRANSESOPHAGEAL ECHOCARDIOGRAM (TEE);  Surgeon: Pixie Casino, MD;  Location: [facility] ENDOSCOPY;  Service: Cardiovascular;  Laterality: N/A;   TONSILLECTOMY     VENTRICULO-PERITONEAL SHUNT PLACEMENT / LAPAROSCOPIC INSERTION PERITONEAL CATHETER     Family History:  Family History  Problem Relation Age of Onset   Hypertension Mother    Mental illness Neg Hx    Family Psychiatric  History: none Social History:  Social History   Substance and Sexual Activity  Alcohol Use No     Social History   Substance and Sexual Activity  Drug Use Yes   Types: Other-see comments, Amphetamines    Social History   Socioeconomic History   Marital status: Single    Spouse name: Not on file   Number of children: Not on file   Years of education: Not on file   Highest education level: Not on file  Occupational History   Not on file  Tobacco  Use   Smoking status: Current Every Day Smoker    Packs/day: 0.00    Types: Cigars   Smokeless tobacco: Never Used  Substance and Sexual Activity   Alcohol use: No   Drug use: Yes    Types: Other-see comments, Amphetamines   Sexual activity: Not on file  Other Topics Concern   Not on file  Social History Narrative   Not on file   Social Determinants of Health   Financial Resource Strain:    Difficulty of Paying Living Expenses:   Food Insecurity:    Worried About [place] in the Last Year:    Arboriculturist in the Last Year:   Transportation Needs:    Film/video editor (Medical):    Lack of Transportation (Non-Medical):   Physical Activity:    Days of Exercise per Week:    Minutes of Exercise per Session:   Stress:    Feeling of Stress :   Social Connections:    Frequency of Communication with Friends and Family:    Frequency of Social Gatherings with Friends and Family:    Attends Religious Services:    Active Member of Clubs or Organizations:    Attends Archivist Meetings:    Marital Status:    Additional Social History:                         Sleep: Fair  Appetite:  Good  Current Medications: Current Facility-Administered Medications  Medication Dose Route Frequency Provider Last Rate Last Admin   acetaminophen (TYLENOL) tablet 650 mg  650 mg Oral Q6H PRN Mallie Darting, NP       alum & mag hydroxide-simeth (MAALOX/MYLANTA) 200-200-20 MG/5ML suspension 30 mL  30 mL Oral Q4H PRN Merlyn Lot E, NP       ARIPiprazole (ABILIFY) tablet 5 mg  5 mg Oral Daily Merlyn Lot E, NP   5 mg at 12/15/19 0838   [START ON 01/10/2020] ARIPiprazole ER (ABILIFY MAINTENA) injection 400 mg  400 mg Intramuscular Q28 days Mallie Darting, NP       benztropine (COGENTIN) tablet 1 mg  1 mg Oral BID Merlyn Lot E, NP   1 mg at 12/15/19 1722   hydrOXYzine (ATARAX/VISTARIL) tablet 25 mg  25 mg Oral TID PRN Mallie Darting, NP   25 mg at 12/14/19  2025   magnesium hydroxide (MILK OF MAGNESIA) suspension 30 mL  30 mL Oral Daily PRN Mallie Darting, NP       traZODone (DESYREL) tablet 50 mg  50 mg Oral QHS PRN Mallie Darting, NP   50 mg at 12/14/19 2026    Lab Results: No results found for this or any previous visit (from the past 48 hour(s)).  Blood Alcohol level:  Lab Results  Component Value Date   [facility] <10 12/13/2019   ETH <10 [id number]    Metabolic Disorder Labs: Lab Results  Component Value Date  HGBA1C 5.0 12/22/2016   MPG 97 12/22/2016   MPG 97 09/09/2015   Lab Results  Component Value Date   PROLACTIN 2.4 (L) 12/22/2016   PROLACTIN 8.0 09/09/2015   Lab Results  Component Value Date   CHOL 130 12/22/2016   TRIG 193 (H) 12/22/2016   HDL 35 (L) 12/22/2016   CHOLHDL 3.7 12/22/2016   VLDL 39 12/22/2016   LDLCALC 56 12/22/2016   LDLCALC 84 09/09/2015    Physical Findings: AIMS: Facial and Oral Movements Muscles of Facial Expression: None, normal Lips and Perioral Area: None, normal Jaw: None, normal Tongue: None, normal,Extremity Movements Upper (arms, wrists, hands, fingers): None, normal Lower (legs, knees, ankles, toes): None, normal, Trunk Movements Neck, shoulders, hips: None, normal, Overall Severity Severity of abnormal movements (highest score from questions above): None, normal Incapacitation due to abnormal movements: None, normal Patient's awareness of abnormal movements (rate only patient's report): No Awareness, Dental Status Current problems with teeth and/or dentures?: No Does patient usually wear dentures?: No  CIWA:  CIWA-Ar Total: 0 COWS:  COWS Total Score: 1  Musculoskeletal: Strength & Muscle Tone: within normal limits Gait & Station: normal Patient leans: N/A  Psychiatric Specialty Exam: Physical Exam  Nursing note and vitals reviewed. Constitutional: He is oriented to person, place, and time. He appears well-developed and well-nourished.  HENT:  Head: Normocephalic.   Respiratory: Effort normal.  Musculoskeletal:        General: Normal range of motion.     Cervical back: Normal range of motion.  Neurological: He is alert and oriented to person, place, and time.  Psychiatric: His speech is normal. Judgment and thought content normal. His mood appears anxious. His affect is blunt. He is actively hallucinating. Cognition and memory are normal.    Review of Systems  Psychiatric/Behavioral: Positive for hallucinations. The patient is nervous/anxious.   All other systems reviewed and are negative.   Blood pressure 123/61, pulse 96, temperature 98.4 F (36.9 C), temperature source Oral, resp. rate 18, height 5\' 1"  (1.549 m), weight 103.4 kg, SpO2 99 %.Body mass index is 43.07 kg/m.  General Appearance: Casual  Eye Contact:  Fair  Speech:  Clear and Coherent  Volume:  Normal  Mood:  Anxious, Depressed and Irritable  Affect:  Blunt  Thought Process:  Coherent and Descriptions of Associations: Intact  Orientation:  Full (Time, Place, and Person)  Thought Content:  Hallucinations: Auditory Visual  Suicidal Thoughts:  No  Homicidal Thoughts:  No  Memory:  Immediate;   Fair Recent;   Fair Remote;   Fair  Judgement:  Fair  Insight:  Fair  Psychomotor Activity:  Decreased  Concentration:  Concentration: Fair and Attention Span: Fair  Recall:  [employer] of Knowledge:  Fair  Language:  Good  Akathisia:  No  Handed:  Right  AIMS (if indicated):     Assets:  Housing Leisure Time Physical Health Resilience Social Support  ADL's:  Intact  Cognition:  WNL  Sleep:       Treatment Plan Summary: Daily contact with patient to assess and evaluate symptoms and progress in treatment, Medication management and Plan schizoaffective disorder, bipolar type:   -Abilify 5 mg BID -24 hour stabilization in Obs  EPS: Cogentin 1 mg BID  Anxiety: -Vistaril 25 mg TID PRN   Insomnia: -Trazodone 50 mg PRN  Waylan Boga, NP 12/15/2019, 5:57 PM Patient seen  face-to-face for psychiatric evaluation, chart reviewed and case discussed with the physician extender and developed treatment plan. Reviewed  the information documented and agree with the treatment plan. Corena Pilgrim, MD

## 2019-12-15 NOTE — Progress Notes (Signed)
   12/15/19 0800  Psych Admission Type (Psych Patients Only)  Admission Status Voluntary  Psychosocial Assessment  Patient Complaints Depression;Restlessness;Irritability  Eye Contact Poor  Facial Expression Flat  Affect Irritable  Speech Logical/coherent  Interaction Guarded;Avoidant  Motor Activity Fidgety;Restless  Appearance/Hygiene Unremarkable  Behavior Characteristics Irritable;Guarded  Mood Suspicious;Irritable  Thought Process  Coherency WDL  Content WDL  Delusions None reported or observed  Perception WDL  Hallucination None reported or observed  Judgment Impaired  Confusion None  Danger to Self  Current suicidal ideation? Denies  Self-Injurious Behavior No self-injurious ideation or behavior indicators observed or expressed   Agreement Not to Harm Self Yes  Description of Agreement Verbally contracts for safety  Danger to Others  Danger to Others None reported or observed

## 2019-12-15 NOTE — Progress Notes (Signed)
Patient meets criteria for inpatient treatment. No appropriate or available beds at [facility]. CSW faxed referrals to the following facilities for review:  [facility]   TTS will continue to seek bed placement.  Chalmers Guest. Guerry Bruin, MSW, [place] Work/Disposition Phone: [phone] Fax: [phone]

## 2019-12-16 DIAGNOSIS — F250 Schizoaffective disorder, bipolar type: Secondary | ICD-10-CM | POA: Diagnosis not present

## 2019-12-16 DIAGNOSIS — F1510 Other stimulant abuse, uncomplicated: Secondary | ICD-10-CM | POA: Diagnosis not present

## 2019-12-16 MED ORDER — ARIPIPRAZOLE 5 MG PO TABS: 5.0000 mg | ORAL_TABLET | Freq: Every day | ORAL | 0 refills | Status: DC

## 2019-12-16 NOTE — BHH Suicide Risk Assessment (Cosign Needed)
Suicide Risk Assessment     BHH Discharge Suicide Risk Assessment   Principal Problem: Schizoaffective disorder, bipolar type [facility]) Discharge Diagnoses: Principal Problem:   Schizoaffective disorder, bipolar type ([place]) Active Problems:   Amphetamine abuse ([place])   Schizoaffective disorder ([place])   Total Time spent with patient: 30 minutes  Musculoskeletal: Strength & Muscle Tone: within normal limits Gait & Station: normal Patient leans: N/A  Psychiatric Specialty Exam:   Blood pressure 123/61, pulse 96, temperature 98.4 F (36.9 C), temperature source Oral, resp. rate 18, height 5\' 1"  (1.549 m), weight 103.4 kg, SpO2 99 %.Body mass index is 43.07 kg/m.  General Appearance: Casual  Eye Contact::  Good  Speech:  Normal Rate409  Volume:  Normal  Mood:  Anxious  Affect:  Blunt  Thought Process:  Coherent and Descriptions of Associations: Intact  Orientation:  Full (Time, Place, and Person)  Thought Content:  EDL  Suicidal Thoughts:  No  Homicidal Thoughts:  No  Memory:  Immediate;   Good Recent;   Good Remote;   Good  Judgement:  Good  Insight:  Good  Psychomotor Activity:  Normal  Concentration:  Good  Recall:  Good  Fund of Knowledge:Fair  Language: Good  Akathisia:  No  Handed:  Right  AIMS (if indicated):     Assets:  Housing Leisure Time Physical Health Resilience Social Support  Sleep:     Cognition: WNL  ADL's:  Intact   Mental Status Per Nursing Assessment::   On Admission:  Suicide plan  Demographic Factors:  Male and Adolescent or young adult  Loss Factors: NA  Historical Factors: Impulsivity  Risk Reduction Factors:   Sense of responsibility to family, Living with another person, especially a relative, Positive social support and Positive therapeutic relationship  Continued Clinical Symptoms:  None  Cognitive Features That Contribute To Risk:  None    Suicide Risk:  Minimal: No identifiable suicidal ideation.  Patients presenting  with no risk factors but with morbid ruminations; may be classified as minimal risk based on the severity of the depressive symptoms   Plan Of Care/Follow-up recommendations:  Plan schizoaffective disorder, bipolar type:  -Abilify 5 mg BID -24 hour stabilization in Obs  EPS: Cogentin 1 mg BID  Anxiety: -Vistaril 25 mg TID PRN   Insomnia: -Trazodone 50 mg PRN Activity:  as tolerated Diet:  heart healthy diet  Waylan Boga, NP 12/16/2019, 2:51 PM

## 2019-12-16 NOTE — Progress Notes (Signed)
Patient ID: Justin Chaney, male   DOB: 10-12-1995, 24 y.o.   MRN: [id number]   D: Pt alert and oriented on the unit.   A: Education, support, and encouragement provided. Discharge summary, medications and follow up appointments reviewed with pt. Suicide prevention resources provided. Pt's belongings in locker # 8 returned and belongings sheet signed.  R: Pt denies SI/HI, A/VH, pain, or any concerns at this time. Pt ambulatory on and off unit. Pt discharged to lobby.

## 2019-12-16 NOTE — Progress Notes (Addendum)
Pt did not want to room with other pt last night, became agitated. Did not move pt at that time. Pt did not endorse SI/HI/AVH to this writer this shift. Pt slept throughout night with no complaints. Respirations even and unlabored during hours of sleep. Safety maintained.

## 2019-12-16 NOTE — Progress Notes (Signed)
   12/16/19 0800  Psych Admission Type (Psych Patients Only)  Admission Status Voluntary  Psychosocial Assessment  Patient Complaints None  Eye Contact None  Facial Expression Flat  Affect Flat  Speech Logical/coherent  Interaction Avoidant  Motor Activity Other (Comment) (WNL)  Appearance/Hygiene Unremarkable  Behavior Characteristics Guarded  Mood Irritable  Thought Process  Coherency WDL  Content WDL  Delusions None reported or observed  Perception WDL  Hallucination None reported or observed  Judgment Impaired  Confusion None  Danger to Self  Current suicidal ideation? Denies  Self-Injurious Behavior No self-injurious ideation or behavior indicators observed or expressed   Agreement Not to Harm Self Yes  Description of Agreement Verbally contracts for safety  Danger to Others  Danger to Others None reported or observed

## 2019-12-16 NOTE — Discharge Instructions (Signed)
Follow up with [facility]

## 2019-12-17 ENCOUNTER — Telehealth: Payer: Self-pay | Admitting: General Practice

## 2019-12-17 NOTE — Telephone Encounter (Signed)
Negative COVID results given. Patient results "NOT Detected." Caller expressed understanding. ° °

## 2019-12-24 NOTE — Discharge Summary (Signed)
Physician Discharge Summary Note  Patient:  Justin Chaney is an 24 y.o., male MRN:  [id number] DOB:  03-05-1996 Patient phone:  [phone] (home)  Patient address:   [place] [address],  Total Time spent with patient: 45 minutes  Date of Admission:  12/13/2019 Date of Discharge: 12/16/19  Reason for Admission:  Suicidal ideatoins  Principal Problem: Schizoaffective disorder, bipolar type [facility]) Discharge Diagnoses: Principal Problem:   Schizoaffective disorder, bipolar type ([place]) Active Problems:   Amphetamine abuse ([place])   Schizoaffective disorder ([place])  Past Psychiatric History: schizoaffective disorder, substance use disorder  Past Medical History:  Past Medical History:  Diagnosis Date  . Asthma   . Bipolar disorder ([place])   . 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 Depression   . Homelessness   . Schizophrenia ([place])   . Seizures ([place])    childhood seizures  . Suicidal behavior   . Suicidal intent     Past Surgical History:  Procedure Laterality Date  . APPENDECTOMY    . SHUNT REMOVAL    . TEE WITHOUT CARDIOVERSION N/A 08/14/2019   Procedure: TRANSESOPHAGEAL ECHOCARDIOGRAM (TEE);  Surgeon: Pixie Casino, MD;  Location: [facility] ENDOSCOPY;  Service: Cardiovascular;  Laterality: N/A;  . TONSILLECTOMY    . VENTRICULO-PERITONEAL SHUNT PLACEMENT / LAPAROSCOPIC INSERTION PERITONEAL CATHETER     Family History:  Family History  Problem Relation Age of Onset  . Hypertension Mother   . Mental illness Neg Hx    Family Psychiatric  History: none Social History:  Social History   Substance and Sexual Activity  Alcohol Use No     Social History   Substance and Sexual Activity  Drug Use Yes  . Types: Other-see comments, Amphetamines    Social History   Socioeconomic History  . Marital status: Single    Spouse name: Not on file  . Number of children: Not on file  . Years of education: Not on file  . Highest education  level: Not on file  Occupational History  . Not on file  Tobacco Use  . Smoking status: Current Every Day Smoker    Packs/day: 0.00    Types: Cigars  . Smokeless tobacco: Never Used  Substance and Sexual Activity  . Alcohol use: No  . Drug use: Yes    Types: Other-see comments, Amphetamines  . Sexual activity: Not on file  Other Topics Concern  . Not on file  Social History Narrative  . Not on file   Social Determinants of Health   Financial Resource Strain:   . Difficulty of Paying Living Expenses:   Food Insecurity:   . Worried About Charity fundraiser in the Last Year:   . Arboriculturist in the Last Year:   Transportation Needs:   . Film/video editor (Medical):   Marland Kitchen Lack of Transportation (Non-Medical):   Physical Activity:   . Days of Exercise per Week:   . Minutes of Exercise per Session:   Stress:   . Feeling of Stress :   Social Connections:   . Frequency of Communication with Friends and Family:   . Frequency of Social Gatherings with Friends and Family:   . Attends Religious Services:   . Active Member of Clubs or Organizations:   . Attends Archivist Meetings:   Marland Kitchen Marital Status:     Hospital Course:   12/13/19: Admitted to [facility] Obs for suicidal ideations.  HPI per TTS:  24 year old  male, single, no children, lives with his mother. 24 year old male, presented with suicidal ideations and reported plans to overdose on heroin or cut his wrists. Endorsed depression. Also endorsed psychotic symptoms.  Today presents as fair historian.He reports "I was doing well, I was clean for years". He reports he relapsed about 2 to 3 weeks ago on crystal methamphetamine. He states he has been struggling with worsening depression and endorses suicidal ideations. He states that prior to admission "I tried to hang myself myself but I was too high so I could not". He also reports he was considering overdosing. He also reports auditory hallucinations.  Describes seeing "demons", "spirits",and also endorses auditory hallucinations of a demeaning/critical nature telling him that he is "worthless". Of note, he reports he had been experiencing some psychotic symptoms prior to his relapse but that hallucinations have worsened significantly since he relapsed.  12/14/19: Per MD:  24 year old male, single, no children, lives with his mother. 24 year old male, presented with suicidal ideations and reported plans to overdose on heroin or cut his wrists. Endorsed depression.  Also endorsed psychotic symptoms.  Today presents as fair historian .  He reports "I was doing well, I was clean for years".  He reports he relapsed about 2 to 3 weeks ago on crystal methamphetamine.  He states he has been struggling with worsening depression and endorses suicidal ideations.  He states that prior to admission "I tried to hang myself myself but I was too high so I could not".  He also reports he was considering overdosing.  He also reports auditory hallucinations.  Describes seeing "demons", " spirits", and also endorses auditory hallucinations of a demeaning/critical nature telling him that he is "worthless".  Of note, he reports he had been experiencing some psychotic symptoms prior to his relapse but that hallucinations have worsened significantly since he relapsed. He was not taking psychiatric medications recently  Chart review indicates a prior hospitalization in October 2020 at another facility for similar presentation-  suicidal ideations, auditory hallucinations, relapse on stimulant.   He has been diagnosed with schizoaffective disorder.  He also has a history of stimulant use disorder.  At the time was managed with Abilify.  Restarted on Abilify 5 mg daily   12/15/19: Client denies hallucinations along with suicidal/homicidal ideations his sleep is "not good" and his appetite is "good."  He did receive services through the act team at [facility] in the past, he  reports he "dropped them."  He lives with his mother and he reports that it is going "pretty good".  Long history of getting upset with his mother resulting in arguments or altercations and coming to the emergency department to de-escalate.  Recommended discharge to the client who was adamant that he needed 1 more day prior to discharging as he did not feel stable enough.  Discussed him be involved with the discharge plan for tomorrow and continuing care at [facility].  Dr. Darleene Cleaver reviewed this client in person and concurs with the plan.   Physical Findings: AIMS: Facial and Oral Movements Muscles of Facial Expression: None, normal Lips and Perioral Area: None, normal Jaw: None, normal Tongue: None, normal,Extremity Movements Upper (arms, wrists, hands, fingers): None, normal Lower (legs, knees, ankles, toes): None, normal, Trunk Movements Neck, shoulders, hips: None, normal, Overall Severity Severity of abnormal movements (highest score from questions above): None, normal Incapacitation due to abnormal movements: None, normal Patient's awareness of abnormal movements (rate only patient's report): No Awareness, Dental Status Current problems with teeth and/or dentures?:  No Does patient usually wear dentures?: No  CIWA:  CIWA-Ar Total: 0 COWS:  COWS Total Score: 1  Musculoskeletal: Strength & Muscle Tone: within normal limits Gait & Station: normal Patient leans: N/A    Musculoskeletal: Strength & Muscle Tone: within normal limits Gait & Station: normal Patient leans: N/A  Psychiatric Specialty Exam:   Blood pressure 123/61, pulse 96, temperature 98.4 F (36.9 C), temperature source Oral, resp. rate 18, height 5\' 1"  (1.549 m), weight 103.4 kg, SpO2 99 %.Body mass index is 43.07 kg/m.  General Appearance: Casual  Eye Contact::  Good  Speech:  Normal Rate409  Volume:  Normal  Mood:  Anxious  Affect:  Blunt  Thought Process:  Coherent and Descriptions of Associations: Intact   Orientation:  Full (Time, Place, and Person)  Thought Content:  EDL  Suicidal Thoughts:  No  Homicidal Thoughts:  No  Memory:  Immediate;   Good Recent;   Good Remote;   Good  Judgement:  Good  Insight:  Good  Psychomotor Activity:  Normal  Concentration:  Good  Recall:  Good  Fund of Knowledge:Fair  Language: Good  Akathisia:  No  Handed:  Right  AIMS (if indicated):     Assets:  Housing Leisure Time Physical Health Resilience Social Support  Sleep:     Cognition: WNL  ADL's:  Intact     Has this patient used any form of tobacco in the last 30 days? (Cigarettes, Smokeless Tobacco, Cigars, and/or Pipes) NA  Blood Alcohol level:  Lab Results  Component Value Date   ETH <10 12/13/2019   ETH <10 123456    Metabolic Disorder Labs:  Lab Results  Component Value Date   HGBA1C 5.0 12/22/2016   MPG 97 12/22/2016   MPG 97 09/09/2015   Lab Results  Component Value Date   PROLACTIN 2.4 (L) 12/22/2016   PROLACTIN 8.0 09/09/2015   Lab Results  Component Value Date   CHOL 130 12/22/2016   TRIG 193 (H) 12/22/2016   HDL 35 (L) 12/22/2016   CHOLHDL 3.7 12/22/2016   VLDL 39 12/22/2016   LDLCALC 56 12/22/2016   LDLCALC 84 09/09/2015    See Psychiatric Specialty Exam and Suicide Risk Assessment completed by Attending Physician prior to discharge.  Discharge destination:  Home  Is patient on multiple antipsychotic therapies at discharge:  No   Has Patient had three or more failed trials of antipsychotic monotherapy by history:  No  Recommended Plan for Multiple Antipsychotic Therapies: NA  Discharge Instructions    Diet - low sodium heart healthy   Complete by: As directed    Discharge instructions   Complete by: As directed    Discharge home   Increase activity slowly   Complete by: As directed      Allergies as of 12/16/2019      Reactions   Amoxicillin Hives, Other (See Comments)   CHILDHOOD ALLERGY - Per [facility] records, tolerates Ancef and  Rocephin Has patient had a PCN reaction causing immediate rash, facial/tongue/throat swelling, SOB or lightheadedness with hypotension: YES Has patient had a PCN reaction causing severe rash involving mucus membranes or skin necrosis: No Has patient had a PCN reaction that required hospitalization No      Medication List    STOP taking these medications   benztropine 1 MG tablet Commonly known as: COGENTIN   haloperidol 20 MG tablet Commonly known as: HALDOL   lithium carbonate 300 MG CR tablet Commonly known as: LITHOBID   traZODone  50 MG tablet Commonly known as: DESYREL     TAKE these medications     Indication  ARIPiprazole ER 400 MG Srer injection Commonly known as: ABILIFY MAINTENA Inject 2 mLs (400 mg total) into the muscle every 28 (twenty-eight) days. DUE APPROX 7/16 What changed: Another medication with the same name was added. Make sure you understand how and when to take each.  Indication: MIXED BIPOLAR AFFECTIVE DISORDER   ARIPiprazole 5 MG tablet Commonly known as: ABILIFY Take 1 tablet (5 mg total) by mouth daily. What changed: You were already taking a medication with the same name, and this prescription was added. Make sure you understand how and when to take each.  Indication: Schizoaffective disorder       Follow-up recommendations:  Activity:  as tolerated Diet:  heart healthy diet   Plan Of Care/Follow-up recommendations:  Planschizoaffective disorder, bipolar type: -Abilify 5 mg BID -24 hour stabilization in Obs  EPS: Cogentin 1 mg BID  Anxiety: -Vistaril 25 mg TID PRN   Insomnia: -Trazodone 50 mg PRN Activity:  as tolerated Diet:  heart healthy die Comments:  Follow up with outpatient provider  Signed: Waylan Boga, NP 12/24/2019, 11:28 AM

## 2020-01-09 ENCOUNTER — Other Ambulatory Visit: Payer: Self-pay

## 2020-01-09 ENCOUNTER — Emergency Department (HOSPITAL_COMMUNITY)
Admission: EM | Admit: 2020-01-09 | Discharge: 2020-01-10 | Disposition: A | Discharge status: Home / Self Care | Payer: Medicaid Other | Attending: Emergency Medicine | Admitting: Emergency Medicine

## 2020-01-09 DIAGNOSIS — F29 Unspecified psychosis not due to a substance or known physiological condition: Secondary | ICD-10-CM | POA: Diagnosis not present

## 2020-01-09 DIAGNOSIS — F17290 Nicotine dependence, other tobacco product, uncomplicated: Secondary | ICD-10-CM | POA: Insufficient documentation

## 2020-01-09 DIAGNOSIS — Z79899 Other long term (current) drug therapy: Secondary | ICD-10-CM | POA: Insufficient documentation

## 2020-01-09 DIAGNOSIS — F1994 Other psychoactive substance use, unspecified with psychoactive substance-induced mood disorder: Secondary | ICD-10-CM | POA: Diagnosis present

## 2020-01-09 DIAGNOSIS — F1910 Other psychoactive substance abuse, uncomplicated: Secondary | ICD-10-CM

## 2020-01-09 DIAGNOSIS — F1210 Cannabis abuse, uncomplicated: Secondary | ICD-10-CM | POA: Insufficient documentation

## 2020-01-09 DIAGNOSIS — F1510 Other stimulant abuse, uncomplicated: Secondary | ICD-10-CM | POA: Diagnosis not present

## 2020-01-09 DIAGNOSIS — F250 Schizoaffective disorder, bipolar type: Secondary | ICD-10-CM | POA: Insufficient documentation

## 2020-01-09 DIAGNOSIS — R441 Visual hallucinations: Secondary | ICD-10-CM | POA: Diagnosis present

## 2020-01-09 DIAGNOSIS — F209 Schizophrenia, unspecified: Secondary | ICD-10-CM | POA: Diagnosis present

## 2020-01-09 DIAGNOSIS — F1914 Other psychoactive substance abuse with psychoactive substance-induced mood disorder: Secondary | ICD-10-CM | POA: Insufficient documentation

## 2020-01-09 DIAGNOSIS — Z20822 Contact with and (suspected) exposure to covid-19: Secondary | ICD-10-CM | POA: Insufficient documentation

## 2020-01-09 MED ORDER — ARIPIPRAZOLE 5 MG PO TABS
5.0000 mg | ORAL_TABLET | Freq: Every day | ORAL | Status: DC
  Administered 2020-01-10: 5 mg via ORAL
  Filled 2020-01-09: qty 1

## 2020-01-09 MED ORDER — BACITRACIN ZINC 500 UNIT/GM EX OINT
TOPICAL_OINTMENT | Freq: Two times a day (BID) | CUTANEOUS | Status: DC
  Administered 2020-01-10 (×2): 1 via TOPICAL
  Filled 2020-01-09: qty 4.5
  Filled 2020-01-09: qty 0.9

## 2020-01-09 NOTE — Progress Notes (Signed)
Unsuccessful blood draw. Notified phlebotomy.

## 2020-01-09 NOTE — BHH Counselor (Addendum)
Clinician called and noted from [place], RN, the pt is getting an EKG. RN to call once the procedure is completed.    Vertell Novak, [place], [facility], [facility] Triage Specialist [phone]

## 2020-01-09 NOTE — Progress Notes (Signed)
Patient changed in burgundy scrubs and wanded by security. Belongings placed in locker 30.

## 2020-01-09 NOTE — ED Provider Notes (Signed)
TIME SEEN: 11:25 PM  CHIEF COMPLAINT: Requesting detox, hallucinations  HPI: Patient is a 24 year old male with history of schizophrenia, homelessness, substance abuse he reports he is here because he would like detox and rehab from cocaine and he also is having hallucinations or he states he is seeing demons and spirits.  He does not elaborate any further.  He denies SI or HI.  Denies acute pain, fever, cough, vomiting, diarrhea.  ROS: See HPI Constitutional: no fever  Eyes: no drainage  ENT: no runny nose   Cardiovascular:  no chest pain  Resp: no SOB  GI: no vomiting GU: no dysuria Integumentary: no rash  Allergy: no hives  Musculoskeletal: no leg swelling  Neurological: no slurred speech ROS otherwise negative  PAST MEDICAL HISTORY/PAST SURGICAL HISTORY:  Past Medical History:  Diagnosis Date  . Asthma   . Bipolar disorder ([place])   . 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 Depression   . Homelessness   . Schizophrenia ([place])   . Seizures ([place])    childhood seizures  . Suicidal behavior   . Suicidal intent     MEDICATIONS:  Prior to Admission medications   Medication Sig Start Date End Date Taking? Authorizing Provider  ARIPiprazole (ABILIFY) 5 MG tablet Take 1 tablet (5 mg total) by mouth daily. 12/17/19   Patrecia Pour, NP  ARIPiprazole ER (ABILIFY MAINTENA) 400 MG SRER injection Inject 2 mLs (400 mg total) into the muscle every 28 (twenty-eight) days. DUE APPROX 7/16 Patient not taking: Reported on 08/12/2019 01/16/19   [name], MD    ALLERGIES:  Allergies  Allergen Reactions  . Amoxicillin Hives and Other (See Comments)    CHILDHOOD ALLERGY - Per [facility] records, tolerates Ancef and Rocephin Has patient had a PCN reaction causing immediate rash, facial/tongue/throat swelling, SOB or lightheadedness with hypotension: YES Has patient had a PCN reaction causing severe rash involving mucus membranes or skin necrosis: No Has patient had a PCN  reaction that required hospitalization No    SOCIAL HISTORY:  Social History   Tobacco Use  . Smoking status: Current Every Day Smoker    Packs/day: 0.00    Types: Cigars  . Smokeless tobacco: Never Used  Substance Use Topics  . Alcohol use: No    FAMILY HISTORY: Family History  Problem Relation Age of Onset  . Hypertension Mother   . Mental illness Neg Hx     EXAM: BP 136/89 (BP Location: Left Arm)   Pulse (!) 118   Temp 99.1 F (37.3 C) (Oral)   Resp 18   SpO2 100%  CONSTITUTIONAL: Alert and oriented and responds appropriately to questions. Well-appearing; well-nourished HEAD: Normocephalic EYES: Conjunctivae clear, pupils appear equal, EOM appear intact ENT: normal nose; moist mucous membranes NECK: Supple, normal ROM CARD: Regular and tachycardic; S1 and S2 appreciated; no murmurs, no clicks, no rubs, no gallops RESP: Normal chest excursion without splinting or tachypnea; breath sounds clear and equal bilaterally; no wheezes, no rhonchi, no rales, no hypoxia or respiratory distress, speaking full sentences ABD/GI: Normal bowel sounds; non-distended; soft, non-tender, no rebound, no guarding, no peritoneal signs, no hepatosplenomegaly BACK:  The back appears normal EXT: Normal ROM in all joints; no deformity noted, no edema; no cyanosis, compartments of the upper extremities are soft SKIN: Normal color for age and race; warm; no rash on exposed skin, patient has multiple superficial wounds to bilateral upper extremities that he is currently picking at without signs of superimposed infection  including redness, warmth, induration, fluctuance or bleeding NEURO: Moves all extremities equally PSYCH: Appears slightly agitated but is redirectable.  Fidgety.  Poor eye contact.  Denies SI or HI.  Reports active hallucinations.  MEDICAL DECISION MAKING: Patient here with request for detox as well as hallucinations.  Has history of schizophrenia.  Was admitted to behavioral health  hospital on May 13.  States he feels he needs to go back.  Some of the symptoms may be related to his cocaine use.  He also has potential secondary gain due to homelessness.  Will obtain screening labs, urine, EKG and consult TTS to see if they feel patient would benefit from inpatient psychiatric treatment.  He has no acute medical complaints at this time.  He is here voluntarily.  ED PROGRESS: Spoke with Lytle Michaels with TTS.  They recommend overnight observation and reassessment in the morning.   Labs, urine reviewed/interpreted.  No acute abnormality other than drug screen being positive for amphetamines.  Patient is medically clear.  I reviewed all nursing notes and pertinent previous records as available.  I have reviewed and interpreted any EKGs, lab and urine results, imaging (as available).     Date: 01/09/2020 23:44  Rate: 109  Rhythm: Sinus tachycardia  QRS Axis: normal  Intervals: normal  ST/T Wave abnormalities: normal  Conduction Disutrbances: none  Narrative Interpretation: Sinus tachycardia       Carder Cropp was evaluated in Emergency Department on 01/09/2020 for the symptoms described in the history of present illness. He was evaluated in the context of the global COVID-19 pandemic, which necessitated consideration that the patient might be at risk for infection with the SARS-CoV-2 virus that causes COVID-19. Institutional protocols and algorithms that pertain to the evaluation of patients at risk for COVID-19 are in a state of rapid change based on information released by regulatory bodies including the CDC and federal and state organizations. These policies and algorithms were followed during the patient's care in the ED.      Roslyn Else, Delice Bison, DO 01/10/20 [phone]

## 2020-01-09 NOTE — Progress Notes (Signed)
Patient stated he needs detox from from drugs (methamphetamine). He stated he took his last drug earlier today.

## 2020-01-10 ENCOUNTER — Encounter (HOSPITAL_COMMUNITY): Payer: Self-pay | Admitting: Registered Nurse

## 2020-01-10 LAB — COMPREHENSIVE METABOLIC PANEL
ALT: 25 U/L (ref 0–44)
AST: 33 U/L (ref 15–41)
Albumin: 3.9 g/dL (ref 3.5–5.0)
Alkaline Phosphatase: 94 U/L (ref 38–126)
Anion gap: 10 (ref 5–15)
BUN: 6 mg/dL (ref 6–20)
CO2: 25 mmol/L (ref 22–32)
Calcium: 8.5 mg/dL — ABNORMAL LOW (ref 8.9–10.3)
Chloride: 103 mmol/L (ref 98–111)
Creatinine, Ser: 0.72 mg/dL (ref 0.61–1.24)
GFR calc Af Amer: >60 mL/min (ref >60)
GFR calc non Af Amer: >60 mL/min (ref >60)
Glucose, Bld: 107 mg/dL — ABNORMAL HIGH (ref 70–99)
Potassium: 3.4 mmol/L — ABNORMAL LOW (ref 3.5–5.1)
Sodium: 138 mmol/L (ref 135–145)
Total Bilirubin: 0.6 mg/dL (ref 0.3–1.2)
Total Protein: 7.2 g/dL (ref 6.5–8.1)

## 2020-01-10 LAB — CBC
HCT: 40.3 % (ref 39.0–52.0)
Hemoglobin: 13.2 g/dL (ref 13.0–17.0)
MCH: 27.8 pg (ref 26.0–34.0)
MCHC: 32.8 g/dL (ref 30.0–36.0)
MCV: 85 fL (ref 80.0–100.0)
Platelets: 231 10*3/uL (ref 150–400)
RBC: 4.74 MIL/uL (ref 4.22–5.81)
RDW: 12.1 % (ref 11.5–15.5)
WBC: 6.8 10*3/uL (ref 4.0–10.5)
nRBC: 0 % (ref 0.0–0.2)

## 2020-01-10 LAB — RAPID URINE DRUG SCREEN, HOSP PERFORMED
Amphetamines: POSITIVE — AB
Barbiturates: NOT DETECTED
Benzodiazepines: NOT DETECTED
Cocaine: NOT DETECTED
Opiates: NOT DETECTED
Tetrahydrocannabinol: NOT DETECTED

## 2020-01-10 LAB — SARS CORONAVIRUS 2 BY RT PCR (HOSPITAL ORDER, PERFORMED IN ~~LOC~~ HOSPITAL LAB): SARS Coronavirus 2: NEGATIVE

## 2020-01-10 LAB — ETHANOL: Alcohol, Ethyl (B): <10 mg/dL (ref <10)

## 2020-01-10 LAB — ACETAMINOPHEN LEVEL: Acetaminophen (Tylenol), Serum: <10 ug/mL — ABNORMAL LOW (ref 10–30)

## 2020-01-10 LAB — SALICYLATE LEVEL: Salicylate Lvl: <7 mg/dL — ABNORMAL LOW (ref 7.0–30.0)

## 2020-01-10 NOTE — Discharge Instructions (Addendum)
For your behavioral health needs, you are advised to follow up with one of the following providers at your earliest opportunity:       [facility]      [place], [place] [address]      [phone] [address] [place]      [place], [place] [address]      [phone]

## 2020-01-10 NOTE — BH Assessment (Signed)
[place] Assessment Progress Note  Per Shuvon Rankin, FNP, this pt does not require psychiatric hospitalization at this time.  Pt is to be discharged from [facility] with outpatient referrals.  Pt may be receiving services from [facility] at this time.  Discharge instructions advise pt to follow up either with [facility] or with [facility].  Pt would also benefit from seeing Peer Support Specialists, and a peer support consult has been ordered for pt.  Pt's nurse, Eustaquio Maize, has been notified.  Jalene Mullet, [place] Triage Specialist [phone]

## 2020-01-10 NOTE — BHH Suicide Risk Assessment (Cosign Needed)
Suicide Risk Assessment  Discharge Assessment   [facility] Discharge Suicide Risk Assessment   Principal Problem: Substance induced mood disorder ([place]) Discharge Diagnoses: Principal Problem:   Substance induced mood disorder ([place]) Active Problems:   Amphetamine abuse ([place])   Cannabis abuse   Psychosis ([place])   Total Time spent with patient: 30 minutes  Musculoskeletal: Strength & Muscle Tone: within normal limits Gait & Station: normal Patient leans: N/A  Psychiatric Specialty Exam:   Blood pressure 131/70, pulse 92, temperature 98.9 F (37.2 C), temperature source Oral, resp. rate 18, SpO2 93 %.There is no height or weight on file to calculate BMI.  General Appearance: Casual  Eye Contact::  Good  Speech:  Clear and Coherent and Normal Rate409  Volume:  Normal  Mood:  Anxious  Affect:  Appropriate and Congruent  Thought Process:  Coherent, Goal Directed and Descriptions of Associations: Intact  Orientation:  Full (Time, Place, and Person)  Thought Content:  WDL  Suicidal Thoughts:  No  Homicidal Thoughts:  No  Memory:  Immediate;   Good Recent;   Good  Judgement:  Intact  Insight:  Present  Psychomotor Activity:  Normal  Concentration:  Good  Recall:  Good  Fund of Knowledge:Good  Language: Good  Akathisia:  No  Handed:  Right  AIMS (if indicated):     Assets:  Communication Skills Desire for Improvement  Sleep:     Cognition: WNL  ADL's:  Intact   Mental Status Per Nursing Assessment::   On Admission:    Justin Chaney, 24 y.o., male patient seen via tele psych by this provider, Dr. Dwyane Dee; and chart reviewed on 01/10/20.  On evaluation Justin Chaney reports he came to the hospital to get help with substance use.  States that he would like to go to rehab facility.   During evaluation Justin Chaney is alert/oriented x 4; calm/cooperative; and mood is congruent with affect.  He does not appear to be responding to internal/external stimuli or delusional  thoughts.  Patient denies suicidal/self-harm/homicidal ideation, psychosis, and paranoia.  Patient answered question appropriately.   Peer support ordered to assist with rehab facility   Demographic Factors:  Male and Low socioeconomic status  Loss Factors: Financial problems/change in socioeconomic status  Historical Factors: Impulsivity  Risk Reduction Factors:   Religious beliefs about death, Living with another person, especially a relative and Positive social support  Continued Clinical Symptoms:  Alcohol/Substance Abuse/Dependencies  Cognitive Features That Contribute To Risk:  None    Suicide Risk:  Minimal: No identifiable suicidal ideation.  Patients presenting with no risk factors but with morbid ruminations; may be classified as minimal risk based on the severity of the depressive symptoms    Plan Of Care/Follow-up recommendations:  Activity:  As tolerated Diet:  Heart healthy     Discharge Instructions     For your behavioral health needs, you are advised to follow up with one of the following providers at your earliest opportunity:       [facility]      [place], [place] [address]      [phone] [place], [place] [address]      [phone]      Disposition:  Psychiatrically cleared No evidence of imminent risk to self or others at present.   Patient does not meet criteria  for psychiatric inpatient admission. Supportive therapy provided about ongoing stressors. Discussed crisis plan, support from social network, calling 911, coming to the Emergency Department, and calling Suicide Hotline.  Justin Nodal, NP 01/10/2020, 11:20 AM

## 2020-01-10 NOTE — ED Notes (Signed)
Pt DCd off unit per provider. Pt to [employer]. Pt calm, cooperative, no s/s of distress. DC information given to pt. Belongings given to pt. Pt ambulatory off unit, escorted by NT. Pt transported by [employer].

## 2020-01-10 NOTE — Patient Outreach (Signed)
[facility] assisted Pt to Safe transportation with instruction for when arrive at  [facility].

## 2020-01-10 NOTE — Progress Notes (Signed)
Cleansed patient's open areas on right arm and upper back with normal saline. Covered areas with bacitracin on areas.

## 2020-01-10 NOTE — BH Assessment (Addendum)
Tele Assessment Note   Patient Name: Justin Chaney MRN: [id number] Referring Physician: Dr. Cyril Mourning Ward.  Location of Patient: [facility], [id number]. Location of Provider: [place] is an 24 y.o. male, who presents voluntary and unaccompanied to [facility]. Clinician asked the pt, "what brought you to the hospital?" Pt reported, "I want to detox, to get off drugs." Pt reported, he is seeing thining and being attacked by spirits and demons. Clinician asked the pt to describe the attack. Pt replied, "attacking me literally, all the time." Clinician asked the pt if he's also experiencing auditory hallucinations if so, could he described them. Pt responded, "I don't want to talk about it, it's a spiritual thing." Pt reported, wanting to hurt everybody. Clinician asked the pt if he had a plan to hurt others, pt replied, "no one is safe, is all you need to know." Pt denies, SI, self-injurious behaviors and access to weapons.    Pt reported, using (smoking/shooting) a lot of methamphetamines, this morning. Pt reported, he just started using heroin, and does not know how much he used this morning. Pt's UDS is pending. Pt denies, being linked to OPT resources (medication management and/or counseling.) Pt denies, taking medications. Pt reported, previous inpatient admissions and wants to go to a long term care facility such as [facility].   Pt presents quiet, awake in scrubs with logical speech. Pt's eye contact was fair. Pt's mood was irritable. Pt's affect was flat. Pt's thought process was coherent, relevant. Pt's judgment was impaired. Pt was oriented x4, Pt's concentration was normal. Pt's insight and impulse control are poor. Pt reported, if inpatient treatment is recommended he will sign-in voluntarily.   *Pt denied having family, friends supports and declined for clinician to call anyone to gather additional information.*   Diagnosis: Schizoaffective disorder, bipolar  type ([place]).  Past Medical History:  Past Medical History:  Diagnosis Date  . Asthma   . Bipolar disorder ([place])   . 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 Depression   . Homelessness   . Schizophrenia ([place])   . Seizures ([place])    childhood seizures  . Suicidal behavior   . Suicidal intent     Past Surgical History:  Procedure Laterality Date  . APPENDECTOMY    . SHUNT REMOVAL    . TEE WITHOUT CARDIOVERSION N/A 08/14/2019   Procedure: TRANSESOPHAGEAL ECHOCARDIOGRAM (TEE);  Surgeon: Pixie Casino, MD;  Location: [facility] ENDOSCOPY;  Service: Cardiovascular;  Laterality: N/A;  . TONSILLECTOMY    . VENTRICULO-PERITONEAL SHUNT PLACEMENT / LAPAROSCOPIC INSERTION PERITONEAL CATHETER      Family History:  Family History  Problem Relation Age of Onset  . Hypertension Mother   . Mental illness Neg Hx     Social History:  reports that he has been smoking cigars. He has been smoking about 0.00 packs per day. He has never used smokeless tobacco. He reports current drug use. Drugs: Other-see comments and Amphetamines. He reports that he does not drink alcohol.  Additional Social History:  Alcohol / Drug Use Pain Medications: See MAR Prescriptions: See MAR Over the Counter: See MAR History of alcohol / drug use?: Yes Substance #1 Name of Substance 1: Amphetamines. 1 - Age of First Use: UTA 1 - Amount (size/oz): Pt reported, using (smoking/shooting) a lot of methamphetamines, this morning. 1 - Frequency: Per pt,"everyday." 1 - Duration: Ongoing. 1 - Last Use / Amount: This morning. Substance #2 Name of Substance 2:  Heroin. 2 - Age of First Use: UTA 2 - Amount (size/oz): Pt reported, "I don't know." 2 - Frequency: Pt reported, he just started using. 2 - Duration: Ongoing. 2 - Last Use / Amount: Per pt, "I don't know."  CIWA: CIWA-Ar BP: 136/89 Pulse Rate: (!) 118 COWS:    Allergies:  Allergies  Allergen Reactions  . Amoxicillin Hives and Other (See  Comments)    CHILDHOOD ALLERGY - Per [facility] records, tolerates Ancef and Rocephin Has patient had a PCN reaction causing immediate rash, facial/tongue/throat swelling, SOB or lightheadedness with hypotension: YES Has patient had a PCN reaction causing severe rash involving mucus membranes or skin necrosis: No Has patient had a PCN reaction that required hospitalization No    Home Medications: (Not in a hospital admission)   OB/GYN Status:  No LMP for male patient.  General Assessment Data [facility] of Assessment: [facility] TTS Assessment: In system Is this a Tele or Face-to-Face Assessment?: Tele Assessment Is this an Initial Assessment or a Re-assessment for this encounter?: Initial Assessment Patient Accompanied by:: N/A Language Other than English: No Living Arrangements: Other (Comment)(Mother. ) What gender do you identify as?: Male Marital status: Single Living Arrangements: Parent Can pt return to current living arrangement?: Yes Admission Status: Voluntary Is patient capable of signing voluntary admission?: Yes Referral Source: Self/Family/Friend Insurance type: Medicaid.     Crisis Care Plan Living Arrangements: Parent Legal Guardian: Other:(Self. ) Name of Psychiatrist: None. Name of Therapist: None.  Education Status Is patient currently in school?: No Is the patient employed, unemployed or receiving disability?: Receiving disability income  Risk to self with the past 6 months Suicidal Ideation: No(Pt denies.) Has patient been a risk to self within the past 6 months prior to admission? : No Suicidal Intent: No Has patient had any suicidal intent within the past 6 months prior to admission? : No Is patient at risk for suicide?: No Suicidal Plan?: No Has patient had any suicidal plan within the past 6 months prior to admission? : No Access to Means: No What has been your use of drugs/alcohol within the last 12 months?: Amphetamines and heroin.  Previous  Attempts/Gestures: No(Pt denies.) How many times?: 0 Other Self Harm Risks: Substance use.  Triggers for Past Attempts: None known Intentional Self Injurious Behavior: None(Pt denies.) Family Suicide History: Unknown Recent stressful life event(s): Other (Comment)(People not believing in me. ) Persecutory voices/beliefs?: Yes Depression: Yes Depression Symptoms: Feeling angry/irritable, Insomnia, Despondent Substance abuse history and/or treatment for substance abuse?: Yes Suicide prevention information given to non-admitted patients: Not applicable  Risk to Others within the past 6 months Homicidal Ideation: Yes-Currently Present Does patient have any lifetime risk of violence toward others beyond the six months prior to admission? : Yes (comment)(Pt reported, history of violence towards others.) Thoughts of Harm to Others: Yes-Currently Present Comment - Thoughts of Harm to Others: Pt reported, wanting to hurt others.  Current Homicidal Intent: (UTA) Current Homicidal Plan: (UTAS) Access to Homicidal Means: No(Pt denies, access to weapons.) Identified Victim: Pt reported. "everybody." History of harm to others?: Yes Assessment of Violence: (UTA) Violent Behavior Description: Pt reported, history of violence towards others. Does patient have access to weapons?: No(Pt denies. ) Criminal Charges Pending?: No Does patient have a court date: No Is patient on probation?: No  Psychosis Hallucinations: Auditory, Visual Delusions: Unspecified  Mental Status Report Appearance/Hygiene: In scrubs Eye Contact: Fair Motor Activity: Unremarkable Speech: Logical/coherent Level of Consciousness: Quiet/awake Mood: Irritable Affect: Flat Anxiety Level:  None Thought Processes: Coherent, Relevant Judgement: Impaired Orientation: Person, Place, Time, Situation Obsessive Compulsive Thoughts/Behaviors: None  Cognitive Functioning Concentration: Normal Memory: Recent Intact Is patient  IDD: No Insight: Poor Impulse Control: Poor Appetite: Fair Sleep: Decreased Total Hours of Sleep: (Pt reported, having sleep deprivation. )  ADLScreening [facility] Assessment Services) Patient's cognitive ability adequate to safely complete daily activities?: Yes Patient able to express need for assistance with ADLs?: Yes Independently performs ADLs?: Yes (appropriate for developmental age)  Prior Inpatient Therapy Prior Inpatient Therapy: Yes Prior Therapy Dates: 12/2019 Prior Therapy Facilty/Provider(s): [facility] Reason for Treatment: AVH, substance use.   Prior Outpatient Therapy Prior Outpatient Therapy: No Does patient have an ACCT team?: No Does patient have Intensive In-House Services?  : No Does patient have [facility] services? : No Does patient have P4CC services?: No  ADL Screening (condition at time of admission) Patient's cognitive ability adequate to safely complete daily activities?: Yes Is the patient deaf or have difficulty hearing?: No Does the patient have difficulty seeing, even when wearing glasses/contacts?: No Does the patient have difficulty concentrating, remembering, or making decisions?: Yes Patient able to express need for assistance with ADLs?: Yes Does the patient have difficulty dressing or bathing?: No Independently performs ADLs?: Yes (appropriate for developmental age) Does the patient have difficulty walking or climbing stairs?: No Weakness of Legs: None Weakness of Arms/Hands: None  Home Assistive Devices/Equipment Home Assistive Devices/Equipment: None    Abuse/Neglect Assessment (Assessment to be complete while patient is alone) Abuse/Neglect Assessment Can Be Completed: Yes Physical Abuse: Denies Verbal Abuse: Yes, past (Comment) Sexual Abuse: Denies Exploitation of patient/patient's resources: Denies Self-Neglect: Denies     Regulatory affairs officer (For Healthcare) Does Patient Have a Medical Advance Directive?: No           Disposition: Adaku Anike, NP recommends overnight observation and reassessment by psychiatry. Disposition discussed with Dr. Leonides Schanz and Kiristin, RN.    Disposition Initial Assessment Completed for this Encounter: Yes  This service was provided via telemedicine using a 2-way, interactive audio and video technology.  Names of all persons participating in this telemedicine service and their role in this encounter. Name: Quron Ruddy. Role: Patient.  Name: Vertell Novak, MS, [facility], [place]. Role: Counselor.          Vertell Novak 01/10/2020 12:32 AM    Vertell Novak, [place], [facility], [employer] Triage Specialist [phone]

## 2020-01-16 ENCOUNTER — Other Ambulatory Visit: Payer: Self-pay

## 2020-01-16 ENCOUNTER — Ambulatory Visit (HOSPITAL_COMMUNITY)
Admission: EM | Admit: 2020-01-16 | Discharge: 2020-01-16 | Disposition: A | Discharge status: Home / Self Care | Payer: Medicaid Other | Attending: Registered Nurse | Admitting: Registered Nurse

## 2020-01-16 DIAGNOSIS — F1994 Other psychoactive substance use, unspecified with psychoactive substance-induced mood disorder: Secondary | ICD-10-CM | POA: Diagnosis not present

## 2020-01-16 DIAGNOSIS — R454 Irritability and anger: Secondary | ICD-10-CM | POA: Insufficient documentation

## 2020-01-16 DIAGNOSIS — F1910 Other psychoactive substance abuse, uncomplicated: Secondary | ICD-10-CM
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
## 2020-01-16 NOTE — ED Notes (Signed)
Patient discharged in no acute distress and escorted to lobby for transportation via safe transport to shelter. Verbalized understanding of all d/c instructions. Resources given for substance abuse tx. Safety maintained.

## 2020-01-16 NOTE — Discharge Instructions (Addendum)
Patient to follow up with peer support to assist with rehab services

## 2020-01-16 NOTE — ED Provider Notes (Signed)
Behavioral Health Medical Screening Exam  Justin Chaney is a 24 y.o. male presented to [facility] requesting assistance in getting into a rehab facility.  States that he has been sent to several facilities "been all over the place. I was at [facility] but I missed one of the daily programs and they kicked me out.  I told them I needed a little more rest."  Patient then with to [facility] and from there "I went to another [facility] and they sent me here.  I just want to get some help man; I'm tired of going from hospital to hospital."  Discussed peer support assisting to get to [facility]) and patient states that he is interested.  During evaluation Justin Chaney is alert/oriented x 4; calm/cooperative; and mood is congruent with affect.  He does not appear to be responding to internal/external stimuli or delusional thoughts.  Patient denies suicidal/self-harm/homicidal ideation, psychosis, and paranoia.  Patient answered question appropriately.      Total Time spent with patient: 30 minutes  Psychiatric Specialty Exam  Presentation  General Appearance: No data recorded Eye Contact:None  Speech:Normal Rate;Clear and Coherent  Speech Volume:Normal  Handedness:Right   Mood and Affect  Mood:Irritable  Affect:Appropriate;Congruent   Thought Process  Thought Processes:Coherent;Goal Directed  Descriptions of Associations:Intact  Orientation:Full (Time, Place and Person)  Thought Content:No data recorded Hallucinations:Hallucinations: None  Ideas of Reference:None  Suicidal Thoughts:Suicidal Thoughts: No  Homicidal Thoughts:No data recorded  Sensorium  Memory:Immediate Good;Recent Good;Remote Good  Judgment:Intact  Insight:Present   Executive Functions  Concentration:Good  Attention Span:Good  [place]  Language:Good   Psychomotor Activity  Psychomotor Activity:Psychomotor Activity: Normal   Assets   Assets:Communication Skills;Desire for Improvement   Sleep  Sleep:Sleep: Good   Physical Exam: Physical Exam ROS Blood pressure 128/72, pulse 96, temperature 98.7 F (37.1 C), temperature source Oral, resp. rate 18, height 5\' 3"  (1.6 m), weight 284 lb (128.8 kg), SpO2 100 %. Body mass index is 50.31 kg/m.  Musculoskeletal: Strength & Muscle Tone: within normal limits Gait & Station: normal Patient leans: N/A   Recommendations:  Follow up with [facility].  Transportation arranged by Peer Support  Based on my evaluation the patient does not appear to have an emergency medical condition.  Luvia Orzechowski, NP 01/16/2020, 2:30 PM

## 2020-01-30 ENCOUNTER — Other Ambulatory Visit: Payer: Self-pay

## 2020-01-30 ENCOUNTER — Emergency Department (HOSPITAL_COMMUNITY)
Admission: EM | Admit: 2020-01-30 | Discharge: 2020-01-30 | Disposition: A | Discharge status: Home / Self Care | Payer: Medicaid Other | Attending: Emergency Medicine | Admitting: Emergency Medicine

## 2020-01-30 ENCOUNTER — Encounter (HOSPITAL_COMMUNITY): Payer: Self-pay | Admitting: Emergency Medicine

## 2020-01-30 DIAGNOSIS — Z0489 Encounter for examination and observation for other specified reasons: Secondary | ICD-10-CM | POA: Diagnosis present

## 2020-01-30 DIAGNOSIS — T754XXA Electrocution, initial encounter: Secondary | ICD-10-CM | POA: Diagnosis not present

## 2020-01-30 DIAGNOSIS — F17290 Nicotine dependence, other tobacco product, uncomplicated: Secondary | ICD-10-CM | POA: Insufficient documentation

## 2020-01-30 DIAGNOSIS — W868XXA Exposure to other electric current, initial encounter: Secondary | ICD-10-CM

## 2020-01-30 NOTE — Discharge Instructions (Signed)
You have been evaluated for your taser injury.  No significant or concerning finding noted.  You are stable for discharge.

## 2020-01-30 NOTE — ED Provider Notes (Signed)
[facility] DEPT Provider Note   CSN: [id number] Arrival date & time: 01/30/20  1156     History No chief complaint on file.   Justin Chaney is a 24 y.o. male.  The history is provided by the patient, the EMS personnel and medical records. No language interpreter was used.     24 year old male with history of bipolar, depression, schizophrenia, homelessness, brought here via EMS after he was tased by the police.  Patient states he was tased and the problem was embedded in his left shoulder and back.  He report he fell to the ground but no loss of consciousness.  He voiced that he is angry for being tased when he did not resist arrest.  He denies any significant pain at this time.  States he has history of seizure and concern that his he may have potentially caused him to seizures.  Denies active seizing.  No shortness of breath or chest pain.  Past Medical History:  Diagnosis Date  . Asthma   . Bipolar disorder ([place])   . 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 Depression   . Homelessness   . Schizophrenia ([place])   . Seizures ([place])    childhood seizures  . Suicidal behavior   . Suicidal intent     Patient Active Problem List   Diagnosis Date Noted  . Schizophrenia ([place]) 12/13/2019  . Sepsis with acute organ dysfunction without septic shock ([place])   . Staphylococcus aureus bacteremia 08/12/2019  . Thrombocytopenia ([place]) 08/12/2019  . Polysubstance abuse ([place]) 08/12/2019  . IVDU (intravenous drug user) 08/12/2019  . 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 Transaminitis 08/12/2019  . Hyperbilirubinemia 08/12/2019  . SIRS (systemic inflammatory response syndrome) ([place]) 08/11/2019  . Normocytic anemia 08/11/2019  . Schizoaffective disorder ([place]) 01/10/2019  . Cocaine use disorder, severe, in early remission ([place]) 12/21/2016  . 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 ADHD (attention deficit hyperactivity disorder) 12/21/2016   . 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 Tobacco use disorder 12/21/2016  . Tobacco use 10/06/2016  . Homicidal ideations 09/16/2016  . Suicidal ideation   . Hallucinations   . Homicidal ideation   . Suicidal thoughts   . Cannabis use disorder, moderate, in sustained remission ([place]) 11/07/2015  . Depression 10/29/2015  . Intermittent explosive disorder 09/05/2015  . Schizoaffective disorder, bipolar type ([place]) 09/04/2015  . Malingering 06/05/2015  . Antisocial personality disorder ([place]) 06/03/2015  . Cocaine abuse ([place]) 06/03/2015  . Substance induced mood disorder ([place]) 02/08/2014  . Psychosis ([place]) 12/31/2013  . Amphetamine abuse ([place]) 08/15/2012  . Cannabis abuse 08/15/2012    Past Surgical History:  Procedure Laterality Date  . APPENDECTOMY    . SHUNT REMOVAL    . TEE WITHOUT CARDIOVERSION N/A 08/14/2019   Procedure: TRANSESOPHAGEAL ECHOCARDIOGRAM (TEE);  Surgeon: Pixie Casino, MD;  Location: [facility] ENDOSCOPY;  Service: Cardiovascular;  Laterality: N/A;  . TONSILLECTOMY    . VENTRICULO-PERITONEAL SHUNT PLACEMENT / LAPAROSCOPIC INSERTION PERITONEAL CATHETER         Family History  Problem Relation Age of Onset  . Hypertension Mother   . Mental illness Neg Hx     Social History   Tobacco Use  . Smoking status: Current Every Day Smoker    Packs/day: 0.00    Types: Cigars  . Smokeless tobacco: Never Used  Vaping Use  . Vaping Use: Never used  Substance Use Topics  . Alcohol use: No  . Drug use: Yes  Types: Other-see comments, Amphetamines    Home Medications Prior to Admission medications   Medication Sig Start Date End Date Taking? Authorizing Provider  ARIPiprazole (ABILIFY) 5 MG tablet Take 1 tablet (5 mg total) by mouth daily. 12/17/19   Patrecia Pour, NP  ARIPiprazole ER (ABILIFY MAINTENA) 400 MG SRER injection Inject 2 mLs (400 mg total) into the muscle every 28 (twenty-eight) days. DUE APPROX 7/16 01/16/19   [name], MD    Allergies     Amoxicillin  Review of Systems   Review of Systems  All other systems reviewed and are negative.   Physical Exam Updated Vital Signs BP (!) 146/57 (BP Location: Left Arm)   Pulse (!) 128   Temp 98.8 F (37.1 C) (Oral)   Resp 19   Ht 5\' 3"  (1.6 m)   Wt 130 kg   SpO2 98%   BMI 50.77 kg/m   Physical Exam Vitals and nursing note reviewed.  Constitutional:      General: He is not in acute distress.    Appearance: He is well-developed. He is obese.  HENT:     Head: Atraumatic.  Eyes:     Conjunctiva/sclera: Conjunctivae normal.  Musculoskeletal:     Cervical back: Neck supple.  Skin:    Findings: No rash.     Comments: Inspection of the skin of patient's chest and back without any obvious signs of deep laceration or injury noted.  No reproducible tenderness to palpation of his left shoulder and his back.  No retained taser prong.  Neurological:     Mental Status: He is alert.     ED Results / Procedures / Treatments   Labs (all labs ordered are listed, but only abnormal results are displayed) Labs Reviewed - No data to display  EKG None  Radiology No results found.  Procedures Procedures (including critical care time)  Medications Ordered in ED Medications - No data to display  ED Course  I have reviewed the triage vital signs and the nursing notes.  Pertinent labs & imaging results that were available during my care of the patient were reviewed by me and considered in my medical decision making (see chart for details).    MDM Rules/Calculators/A&P                          BP 131/67   Pulse (!) 109   Temp 98.8 F (37.1 C) (Oral)   Resp 20   Ht 5\' 3"  (1.6 m)   Wt 130 kg   SpO2 99%   BMI 50.77 kg/m   Final Clinical Impression(s) / ED Diagnoses Final diagnoses:  Taser injury, initial encounter    Rx / DC Orders ED Discharge Orders    None     2:51 PM Patient brought here via [employer] for evaluation after being tased to his left shoulder and  back.  Taser prong has been removed by EMS prior to arrival.  No other signs of injury.  Patient is stable for discharge.   Domenic Moras, PA-C 01/30/20 1457    Fredia Sorrow, MD 01/31/20 [id number]

## 2020-01-30 NOTE — ED Triage Notes (Signed)
Arrives via EMS from [facility], patient got tazed by police, EMS removed the prongs and patient would like to be evaluated.

## 2020-03-31 MED FILL — Aripiprazole Tab 5 MG: ORAL | Qty: 5 | Status: AC

## 2020-03-31 MED FILL — Bacitracin Zinc Oint 500 Unit/GM: CUTANEOUS | Qty: 0.9 | Status: AC

## 2020-12-14 ENCOUNTER — Encounter (HOSPITAL_COMMUNITY): Payer: Self-pay

## 2020-12-14 ENCOUNTER — Emergency Department (HOSPITAL_COMMUNITY)
Admission: EM | Admit: 2020-12-14 | Discharge: 2020-12-16 | Disposition: A | Discharge status: Home / Self Care | Payer: Medicaid Other | Attending: Emergency Medicine | Admitting: Emergency Medicine

## 2020-12-14 ENCOUNTER — Other Ambulatory Visit: Payer: Self-pay

## 2020-12-14 DIAGNOSIS — Z20822 Contact with and (suspected) exposure to covid-19: Secondary | ICD-10-CM | POA: Insufficient documentation

## 2020-12-14 DIAGNOSIS — F1510 Other stimulant abuse, uncomplicated: Secondary | ICD-10-CM | POA: Diagnosis not present

## 2020-12-14 DIAGNOSIS — F1210 Cannabis abuse, uncomplicated: Secondary | ICD-10-CM | POA: Insufficient documentation

## 2020-12-14 DIAGNOSIS — F1410 Cocaine abuse, uncomplicated: Secondary | ICD-10-CM | POA: Insufficient documentation

## 2020-12-14 DIAGNOSIS — J45909 Unspecified asthma, uncomplicated: Secondary | ICD-10-CM | POA: Insufficient documentation

## 2020-12-14 DIAGNOSIS — Y900 Blood alcohol level of less than 20 mg/100 ml: Secondary | ICD-10-CM | POA: Insufficient documentation

## 2020-12-14 DIAGNOSIS — Z765 Malingerer [conscious simulation]: Secondary | ICD-10-CM | POA: Diagnosis not present

## 2020-12-14 DIAGNOSIS — Z79899 Other long term (current) drug therapy: Secondary | ICD-10-CM | POA: Diagnosis not present

## 2020-12-14 DIAGNOSIS — F1520 Other stimulant dependence, uncomplicated: Secondary | ICD-10-CM | POA: Insufficient documentation

## 2020-12-14 DIAGNOSIS — F17290 Nicotine dependence, other tobacco product, uncomplicated: Secondary | ICD-10-CM | POA: Insufficient documentation

## 2020-12-14 DIAGNOSIS — F1994 Other psychoactive substance use, unspecified with psychoactive substance-induced mood disorder: Secondary | ICD-10-CM | POA: Diagnosis not present

## 2020-12-14 DIAGNOSIS — R45851 Suicidal ideations: Secondary | ICD-10-CM | POA: Insufficient documentation

## 2020-12-14 DIAGNOSIS — F17210 Nicotine dependence, cigarettes, uncomplicated: Secondary | ICD-10-CM | POA: Insufficient documentation

## 2020-12-14 DIAGNOSIS — Z9151 Personal history of suicidal behavior: Secondary | ICD-10-CM | POA: Insufficient documentation

## 2020-12-14 NOTE — ED Triage Notes (Signed)
Patient states he has tried to kill himself multiple times over the last few days, has been shooting heroin and meth, last use this afternoon

## 2020-12-15 ENCOUNTER — Encounter (HOSPITAL_COMMUNITY): Payer: Self-pay | Admitting: Registered Nurse

## 2020-12-15 LAB — ETHANOL: Alcohol, Ethyl (B): <10 mg/dL (ref <10)

## 2020-12-15 LAB — RESP PANEL BY RT-PCR (FLU A&B, COVID) ARPGX2
Influenza A by PCR: NEGATIVE
Influenza B by PCR: NEGATIVE
SARS Coronavirus 2 by RT PCR: NEGATIVE

## 2020-12-15 LAB — COMPREHENSIVE METABOLIC PANEL
ALT: 50 U/L — ABNORMAL HIGH (ref 0–44)
AST: 36 U/L (ref 15–41)
Albumin: 3.7 g/dL (ref 3.5–5.0)
Alkaline Phosphatase: 81 U/L (ref 38–126)
Anion gap: 8 (ref 5–15)
BUN: 9 mg/dL (ref 6–20)
CO2: 23 mmol/L (ref 22–32)
Calcium: 9 mg/dL (ref 8.9–10.3)
Chloride: 104 mmol/L (ref 98–111)
Creatinine, Ser: 0.67 mg/dL (ref 0.61–1.24)
GFR, Estimated: >60 mL/min (ref >60)
Glucose, Bld: 101 mg/dL — ABNORMAL HIGH (ref 70–99)
Potassium: 3.8 mmol/L (ref 3.5–5.1)
Sodium: 135 mmol/L (ref 135–145)
Total Bilirubin: 0.9 mg/dL (ref 0.3–1.2)
Total Protein: 7 g/dL (ref 6.5–8.1)

## 2020-12-15 LAB — RAPID URINE DRUG SCREEN, HOSP PERFORMED
Amphetamines: POSITIVE — AB
Barbiturates: NOT DETECTED
Benzodiazepines: NOT DETECTED
Cocaine: POSITIVE — AB
Opiates: NOT DETECTED
Tetrahydrocannabinol: NOT DETECTED

## 2020-12-15 LAB — CBC
HCT: 44.1 % (ref 39.0–52.0)
Hemoglobin: 14 g/dL (ref 13.0–17.0)
MCH: 27.5 pg (ref 26.0–34.0)
MCHC: 31.7 g/dL (ref 30.0–36.0)
MCV: 86.5 fL (ref 80.0–100.0)
Platelets: 322 10*3/uL (ref 150–400)
RBC: 5.1 MIL/uL (ref 4.22–5.81)
RDW: 12.2 % (ref 11.5–15.5)
WBC: 9.5 10*3/uL (ref 4.0–10.5)
nRBC: 0 % (ref 0.0–0.2)

## 2020-12-15 LAB — ACETAMINOPHEN LEVEL: Acetaminophen (Tylenol), Serum: <10 ug/mL — ABNORMAL LOW (ref 10–30)

## 2020-12-15 LAB — SALICYLATE LEVEL: Salicylate Lvl: <7 mg/dL — ABNORMAL LOW (ref 7.0–30.0)

## 2020-12-15 NOTE — ED Provider Notes (Signed)
[facility] EMERGENCY DEPARTMENT Provider Note   CSN: [id number] Arrival date & time: 12/14/20  2215     History Chief Complaint  Patient presents with  . Suicidal    Justin Chaney is a 25 y.o. male.  Patient presents to the emergency department with a chief complaint of suicidal thoughts.  He states that he is tried to kill himself over the past few days by using heroin and meth.  His last use was yesterday.  History difficult to obtain secondary to patient cooperation.  Level 5 caveat applies secondary to psychiatric condition.  The history is provided by the patient. No language interpreter was used.       Past Medical History:  Diagnosis Date  . Asthma   . Bipolar disorder ([place])   . 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 Depression   . Homelessness   . Schizophrenia ([place])   . Seizures ([place])    childhood seizures  . Suicidal behavior   . Suicidal intent     Patient Active Problem List   Diagnosis Date Noted  . Schizophrenia ([place]) 12/13/2019  . Sepsis with acute organ dysfunction without septic shock ([place])   . Staphylococcus aureus bacteremia 08/12/2019  . Thrombocytopenia ([place]) 08/12/2019  . Polysubstance abuse ([place]) 08/12/2019  . IVDU (intravenous drug user) 08/12/2019  . 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 Transaminitis 08/12/2019  . Hyperbilirubinemia 08/12/2019  . SIRS (systemic inflammatory response syndrome) ([place]) 08/11/2019  . Normocytic anemia 08/11/2019  . Schizoaffective disorder ([place]) 01/10/2019  . Cocaine use disorder, severe, in early remission ([place]) 12/21/2016  . 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 ADHD (attention deficit hyperactivity disorder) 12/21/2016  . 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 Tobacco use disorder 12/21/2016  . Tobacco use 10/06/2016  . Homicidal ideations 09/16/2016  . Suicidal ideation   . Hallucinations   . Homicidal ideation   . Suicidal thoughts   . Cannabis use disorder,  moderate, in sustained remission ([place]) 11/07/2015  . Depression 10/29/2015  . Intermittent explosive disorder 09/05/2015  . Schizoaffective disorder, bipolar type ([place]) 09/04/2015  . Malingering 06/05/2015  . Antisocial personality disorder ([place]) 06/03/2015  . Cocaine abuse ([place]) 06/03/2015  . Substance induced mood disorder ([place]) 02/08/2014  . Psychosis ([place]) 12/31/2013  . Amphetamine abuse ([place]) 08/15/2012  . Cannabis abuse 08/15/2012    Past Surgical History:  Procedure Laterality Date  . APPENDECTOMY    . SHUNT REMOVAL    . TEE WITHOUT CARDIOVERSION N/A 08/14/2019   Procedure: TRANSESOPHAGEAL ECHOCARDIOGRAM (TEE);  Surgeon: Pixie Casino, MD;  Location: [facility] ENDOSCOPY;  Service: Cardiovascular;  Laterality: N/A;  . TONSILLECTOMY    . VENTRICULO-PERITONEAL SHUNT PLACEMENT / LAPAROSCOPIC INSERTION PERITONEAL CATHETER         Family History  Problem Relation Age of Onset  . Hypertension Mother   . Mental illness Neg Hx     Social History   Tobacco Use  . Smoking status: Current Every Day Smoker    Packs/day: 0.00    Types: Cigars  . Smokeless tobacco: Never Used  Vaping Use  . Vaping Use: Never used  Substance Use Topics  . Alcohol use: No  . Drug use: Yes    Types: Other-see comments, Amphetamines    Home Medications Prior to Admission medications   Medication Sig Start Date End Date Taking? Authorizing Provider  ARIPiprazole (ABILIFY) 5 MG tablet Take 1 tablet (5 mg total) by mouth daily. Patient not taking: Reported on  12/15/2020 12/17/19   Patrecia Pour, NP  ARIPiprazole ER (ABILIFY MAINTENA) 400 MG SRER injection Inject 2 mLs (400 mg total) into the muscle every 28 (twenty-eight) days. DUE APPROX 7/16 Patient not taking: Reported on 12/15/2020 01/16/19   [name], MD    Allergies    Amoxicillin  Review of Systems   Review of Systems  Unable to perform ROS: Psychiatric disorder    Physical Exam Updated Vital Signs BP 129/88 (BP Location: Left  Arm)   Pulse 86   Temp 99.3 F (37.4 C) (Oral)   Resp 18   Ht 5\' 2"  (1.575 m)   Wt 130 kg   SpO2 97%   BMI 52.42 kg/m   Physical Exam Vitals and nursing note reviewed.  Constitutional:      General: He is not in acute distress.    Appearance: He is well-developed. He is not ill-appearing.  HENT:     Head: Normocephalic and atraumatic.  Eyes:     Conjunctiva/sclera: Conjunctivae normal.  Cardiovascular:     Rate and Rhythm: Normal rate.  Pulmonary:     Effort: Pulmonary effort is normal. No respiratory distress.  Abdominal:     General: There is no distension.  Musculoskeletal:     Cervical back: Neck supple.     Comments: Moves all extremities  Skin:    General: Skin is warm and dry.  Neurological:     Mental Status: He is alert and oriented to person, place, and time.  Psychiatric:        Mood and Affect: Mood normal.        Behavior: Behavior normal.     ED Results / Procedures / Treatments   Labs (all labs ordered are listed, but only abnormal results are displayed) Labs Reviewed  RESP PANEL BY RT-PCR (FLU A&B, COVID) [id number]  COMPREHENSIVE METABOLIC PANEL  ETHANOL  SALICYLATE LEVEL  ACETAMINOPHEN LEVEL  CBC  RAPID URINE DRUG SCREEN, HOSP PERFORMED  CBG MONITORING, ED    EKG None  Radiology No results found.  Procedures Procedures   Medications Ordered in ED Medications - No data to display  ED Course  I have reviewed the triage vital signs and the nursing notes.  Pertinent labs & imaging results that were available during my care of the patient were reviewed by me and considered in my medical decision making (see chart for details).    MDM Rules/Calculators/A&P                         Patient here complaining of suicidal thoughts.  He states he has tried to kill himself several times the past few days by using drugs.  Vitals are stable.  Patient appears clinically stable for TTS evaluation. Final Clinical Impression(s) / ED  Diagnoses Final diagnoses:  Suicidal thoughts    Rx / DC Orders ED Discharge Orders    None       Montine Circle, PA-C 12/15/20 [id number]    Fatima Blank, MD 12/15/20 2103
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
## 2020-12-15 NOTE — ED Notes (Signed)
PT belongings in locker 6 on 12/14/2020 at 2355. PT was also wanded by security

## 2020-12-15 NOTE — BH Assessment (Signed)
Clinician attempted to call pt's nurse however she is currently with a patient. Clinician to call back.     Vertell Novak, [place], [facility], [facility] Triage Specialist [phone]

## 2020-12-15 NOTE — BH Assessment (Signed)
Clinician sent Flint Melter, RN a message via secure chat in Epic to express she can assess the pt if placed in a private room. Clinician asked is the pt under IVC.   Clinician awaiting response.   Vertell Novak, [place], [facility], [facility] Triage Specialist [phone]

## 2020-12-15 NOTE — BH Assessment (Signed)
Clinician received a message from Flint Melter, RN via secure chat in [place], the pt will me place in a private room momentarily, he's currently eating. Clinician asked RN to message her when the pt is ready for his assessment.    Vertell Novak, [place], [facility], [facility] Triage Specialist [phone]

## 2020-12-16 ENCOUNTER — Other Ambulatory Visit: Payer: Self-pay

## 2020-12-16 ENCOUNTER — Encounter (HOSPITAL_COMMUNITY): Payer: Self-pay | Admitting: Registered Nurse

## 2020-12-16 ENCOUNTER — Ambulatory Visit (HOSPITAL_COMMUNITY)
Admission: EM | Admit: 2020-12-16 | Discharge: 2020-12-17 | Disposition: A | Discharge status: Home / Self Care | Payer: Medicaid Other | Source: Home / Self Care | Attending: Psychiatry | Admitting: Psychiatry

## 2020-12-16 DIAGNOSIS — F17210 Nicotine dependence, cigarettes, uncomplicated: Secondary | ICD-10-CM | POA: Insufficient documentation

## 2020-12-16 DIAGNOSIS — Z9151 Personal history of suicidal behavior: Secondary | ICD-10-CM | POA: Insufficient documentation

## 2020-12-16 DIAGNOSIS — F1510 Other stimulant abuse, uncomplicated: Secondary | ICD-10-CM | POA: Diagnosis present

## 2020-12-16 DIAGNOSIS — F1994 Other psychoactive substance use, unspecified with psychoactive substance-induced mood disorder: Secondary | ICD-10-CM | POA: Insufficient documentation

## 2020-12-16 DIAGNOSIS — F1210 Cannabis abuse, uncomplicated: Secondary | ICD-10-CM | POA: Insufficient documentation

## 2020-12-16 DIAGNOSIS — Z765 Malingerer [conscious simulation]: Secondary | ICD-10-CM

## 2020-12-16 DIAGNOSIS — Z79899 Other long term (current) drug therapy: Secondary | ICD-10-CM | POA: Insufficient documentation

## 2020-12-16 DIAGNOSIS — F1410 Cocaine abuse, uncomplicated: Secondary | ICD-10-CM | POA: Diagnosis present

## 2020-12-16 DIAGNOSIS — R45851 Suicidal ideations: Secondary | ICD-10-CM | POA: Insufficient documentation

## 2020-12-16 DIAGNOSIS — F1910 Other psychoactive substance abuse, uncomplicated: Secondary | ICD-10-CM | POA: Diagnosis present

## 2020-12-16 DIAGNOSIS — F250 Schizoaffective disorder, bipolar type: Secondary | ICD-10-CM | POA: Diagnosis present

## 2020-12-16 MED ORDER — TRAZODONE HCL 50 MG PO TABS: 50.0000 mg | ORAL_TABLET | Freq: Every evening | ORAL | Status: DC | PRN

## 2020-12-16 MED ORDER — HYDROXYZINE HCL 25 MG PO TABS
25.0000 mg | ORAL_TABLET | Freq: Three times a day (TID) | ORAL | Status: DC | PRN
  Administered 2020-12-16: 25 mg via ORAL
  Filled 2020-12-16 (×2): qty 1

## 2020-12-16 MED ORDER — ARIPIPRAZOLE 5 MG PO TABS
5.0000 mg | ORAL_TABLET | Freq: Every day | ORAL | Status: DC
  Administered 2020-12-16 – 2020-12-17 (×2): 5 mg via ORAL
  Filled 2020-12-16: qty 14
  Filled 2020-12-16 (×2): qty 1

## 2020-12-16 MED ORDER — TRAZODONE HCL 50 MG PO TABS
ORAL_TABLET | ORAL | Status: AC
  Administered 2020-12-16: 50 mg
  Filled 2020-12-16: qty 1

## 2020-12-16 MED ORDER — ACETAMINOPHEN 325 MG PO TABS
650.0000 mg | ORAL_TABLET | Freq: Four times a day (QID) | ORAL | Status: DC | PRN
  Administered 2020-12-16: 650 mg via ORAL
  Filled 2020-12-16: qty 2

## 2020-12-16 MED ORDER — MAGNESIUM HYDROXIDE 400 MG/5ML PO SUSP: 30.0000 mL | Freq: Every day | ORAL | Status: DC | PRN

## 2020-12-16 MED ORDER — ALUM & MAG HYDROXIDE-SIMETH 200-200-20 MG/5ML PO SUSP: 30.0000 mL | ORAL | Status: DC | PRN

## 2020-12-16 MED ORDER — LORAZEPAM 1 MG PO TABS
1.0000 mg | ORAL_TABLET | Freq: Once | ORAL | Status: AC
  Administered 2020-12-16: 1 mg via ORAL
  Filled 2020-12-16: qty 1

## 2020-12-16 NOTE — ED Notes (Signed)
Dinner provided.

## 2020-12-16 NOTE — ED Notes (Signed)
Sitter at bedside; pt keeps closing blinds to door; this RN explained to pt that blinds need to stay open due to pt being here for attempted suicide; pt states the door and blinds have been closed all night, and that "nobody has been watching me"; I explain that pt needs to be watched per hospital policy;  I ask pt if pt still feels like he wants to kill himself; pt states "get away from me"; will continue to monitor

## 2020-12-16 NOTE — ED Notes (Signed)
Spoke with Darrick Meigs of safe transport, notified that pt is ready to be transported to [facility]; no ETAat this time, but states "there is a wait"

## 2020-12-16 NOTE — ED Notes (Signed)
Lunch tray at bedside. ?

## 2020-12-16 NOTE — ED Provider Notes (Signed)
Behavioral Health Admission [facility])  Date: 12/16/20 Patient Name: Justin Chaney MRN: [id number] Chief Complaint: No chief complaint on file.     Diagnoses:  Final diagnoses:  Malingering  Cocaine abuse (HCC)  Amphetamine abuse (HCC)  Substance induced mood disorder (HCC)  Cannabis abuse    HPI: Justin Chaney, 25 y.o., male patient presents to [facility] for direct admit to Continuous Assessment from [facility] ED with complaints of suicidal ideation with plan to overdose.  Patient seen face to face by this provider, consulted with Dr. Ernie Hew; and chart reviewed on 12/16/20.  On evaluation Justin Chaney reports he has overdosed on heroin 3 times in last 2 weeks.  States he wants to be restarted on his psychotropic medications but could not remember what he was taking "The only one I know is Abilify.  I took it for about 2 weeks."  Patient stats his living with friends and that his mother is his support system "My mom is all I got and she wants me to go to a residential treatment program."  Patient reports he is able to contract for safety if he gets into a treatment program.  Patient states he recently restarted using heroin again but his drug of choice is Meth.  States he is using meth daily.   During evaluation Justin Chaney is alert/oriented x 4; calm/cooperative; with irritable mood and congruent with affect.  He does not appear to be responding to internal/external stimuli or delusional thoughts.  Patient denies homicidal ideation, psychosis, and paranoia.  Patient continues to endorse safety only if he is ale to get into a residential program.  Patient answered question appropriately.    PHQ 2-9:   Flowsheet Row ED from 12/16/2020 in [facility] ED from 12/14/2020 in [place] Admission (Discharged) from 12/13/2019 in [place] CATEGORY High Risk High Risk Moderate  Risk       Total Time spent with patient: 45 minutes  Musculoskeletal  Strength & Muscle Tone: within normal limits Gait & Station: normal Patient leans: N/A  Psychiatric Specialty Exam  Presentation General Appearance: Appropriate for Environment; Casual  Eye Contact:Fair  Speech:Clear and Coherent; Normal Rate  Speech Volume:Normal  Handedness:Right   Mood and Affect  Mood:Irritable  Affect:Congruent   Thought Process  Thought Processes:Coherent; Goal Directed; Linear  Descriptions of Associations:Intact  Orientation:Full (Time, Place and Person)  Thought Content:WDL    Hallucinations:Hallucinations: None  Ideas of Reference:None  Suicidal Thoughts:Suicidal Thoughts: Yes, Active SI Active Intent and/or Plan: With Intent; With Plan; With Means to Carry Out (Patient stating he has had 3 overdoses in past 2 weekk via herion.  At first stated accidental then when thought he was going home said intentional)  Homicidal Thoughts:Homicidal Thoughts: No   Sensorium  Memory:Immediate Good; Recent Good; Remote Good  Judgment:Intact  Insight:Fair; Present   Executive Functions  Concentration:Good  Attention Span:Good  Aneta  Language:Good   Psychomotor Activity  Psychomotor Activity:Psychomotor Activity: Normal   Assets  Assets:Communication Skills; Resilience; Desire for Improvement   Sleep  Sleep:Sleep: Good   Nutritional Assessment (For OBS and FBC admissions only) Has the patient had a weight loss or gain of 10 pounds or more in the last 3 months?: No Has the patient had a decrease in food intake/or appetite?: No Does the patient have dental problems?: No Does the patient have eating habits or behaviors that may be indicators  of an eating disorder including binging or inducing vomiting?: No Has the patient recently lost weight without trying?: No Has the patient been eating poorly because of a decreased  appetite?: No Malnutrition Screening Tool Score: 0    Physical Exam Vitals and nursing note reviewed. Exam conducted with a chaperone present.  Constitutional:      General: He is not in acute distress.    Appearance: Normal appearance. He is not ill-appearing.  HENT:     Head: Normocephalic.  Cardiovascular:     Rate and Rhythm: Normal rate.  Pulmonary:     Effort: Pulmonary effort is normal.  Musculoskeletal:        General: Normal range of motion.     Cervical back: Normal range of motion.  Skin:    General: Skin is warm and dry.  Neurological:     Mental Status: He is alert and oriented to person, place, and time.  Psychiatric:        Attention and Perception: Attention and perception normal. He does not perceive auditory or visual hallucinations.        Mood and Affect: Affect is angry.        Speech: Speech normal.        Behavior: Behavior normal. Behavior is cooperative.        Thought Content: Thought content is not paranoid or delusional. Thought content includes suicidal ideation. Thought content does not include homicidal ideation. Thought content includes suicidal plan.        Cognition and Memory: Cognition and memory normal.        Judgment: Judgment is impulsive.    Review of Systems  Constitutional: Negative.   HENT: Negative.   Eyes: Negative.   Respiratory: Negative.   Cardiovascular: Negative.   Gastrointestinal: Negative.   Genitourinary: Negative.   Musculoskeletal: Negative.   Skin: Negative.   Neurological: Negative.   Endo/Heme/Allergies: Negative.   Psychiatric/Behavioral: Positive for depression, substance abuse and suicidal ideas. Negative for hallucinations. The patient is nervous/anxious. The patient does not have insomnia.     Blood pressure (!) 133/94, pulse 93, temperature 98.7 F (37.1 C), temperature source Oral, resp. rate 16, SpO2 99 %. There is no height or weight on file to calculate BMI.  Past Psychiatric History: See above    Is the patient at risk to self? Yes  Has the patient been a risk to self in the past 6 months? Yes .    Has the patient been a risk to self within the distant past? Yes   Is the patient a risk to others? No   Has the patient been a risk to others in the past 6 months? No   Has the patient been a risk to others within the distant past? No   Past Medical History:  Past Medical History:  Diagnosis Date  . Asthma   . Bipolar disorder ([place])   . 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 Depression   . Homelessness   . Schizophrenia ([place])   . Seizures ([place])    childhood seizures  . Suicidal behavior   . Suicidal intent     Past Surgical History:  Procedure Laterality Date  . APPENDECTOMY    . SHUNT REMOVAL    . TEE WITHOUT CARDIOVERSION N/A 08/14/2019   Procedure: TRANSESOPHAGEAL ECHOCARDIOGRAM (TEE);  Surgeon: Pixie Casino, MD;  Location: [facility] ENDOSCOPY;  Service: Cardiovascular;  Laterality: N/A;  . TONSILLECTOMY    . VENTRICULO-PERITONEAL SHUNT  PLACEMENT / LAPAROSCOPIC INSERTION PERITONEAL CATHETER      Family History:  Family History  Problem Relation Age of Onset  . Hypertension Mother   . Mental illness Neg Hx     Social History:  Social History   Socioeconomic History  . Marital status: Single    Spouse name: Not on file  . Number of children: Not on file  . Years of education: Not on file  . Highest education level: Not on file  Occupational History  . Not on file  Tobacco Use  . Smoking status: Current Every Day Smoker    Packs/day: 0.00    Types: Cigars  . Smokeless tobacco: Never Used  Vaping Use  . Vaping Use: Never used  Substance and Sexual Activity  . Alcohol use: No  . Drug use: Yes    Types: Other-see comments, Amphetamines  . Sexual activity: Not on file  Other Topics Concern  . Not on file  Social History Narrative  . Not on file   Social Determinants of Health   Financial Resource Strain: Not on file  Food  Insecurity: Not on file  Transportation Needs: Not on file  Physical Activity: Not on file  Stress: Not on file  Social Connections: Not on file  Intimate Partner Violence: Not on file    SDOH:  SDOH Screenings   Alcohol Screen: Not on file  Depression (GLO7-5): Not on file  Financial Resource Strain: Not on file  Food Insecurity: Not on file  Housing: Not on file  Physical Activity: Not on file  Social Connections: Not on file  Stress: Not on file  Tobacco Use: High Risk  . Smoking Tobacco Use: Current Every Day Smoker  . Smokeless Tobacco Use: Never Used  Transportation Needs: Not on file    Last Labs:  Admission on 12/14/2020, Discharged on 12/16/2020  Component Date Value Ref Range Status  . Sodium 12/15/2020 135  135 - 145 mmol/L Final  . Potassium 12/15/2020 3.8  3.5 - 5.1 mmol/L Final  . Chloride 12/15/2020 104  98 - 111 mmol/L Final  . CO2 12/15/2020 23  22 - 32 mmol/L Final  . Glucose, Bld 12/15/2020 101* 70 - 99 mg/dL Final   Glucose reference range applies only to samples taken after fasting for at least 8 hours.  . BUN 12/15/2020 9  6 - 20 mg/dL Final  . Creatinine, Ser 12/15/2020 0.67  0.61 - 1.24 mg/dL Final  . Calcium 12/15/2020 9.0  8.9 - 10.3 mg/dL Final  . Total Protein 12/15/2020 7.0  6.5 - 8.1 g/dL Final  . Albumin 12/15/2020 3.7  3.5 - 5.0 g/dL Final  . AST 12/15/2020 36  15 - 41 U/L Final  . ALT 12/15/2020 50* 0 - 44 U/L Final  . Alkaline Phosphatase 12/15/2020 81  38 - 126 U/L Final  . Total Bilirubin 12/15/2020 0.9  0.3 - 1.2 mg/dL Final  . GFR, Estimated 12/15/2020 >60  >60 mL/min Final   Comment: (NOTE) Calculated using the CKD-EPI Creatinine Equation (2021)   . Anion gap 12/15/2020 8  5 - 15 Final   Performed at [facility] [address], [place], [place] [address]  . Alcohol, Ethyl (B) 12/15/2020 <10  <10 mg/dL Final   Comment: (NOTE) Lowest detectable limit for serum alcohol is 10 mg/dL.  For medical purposes only. Performed  at [facility], [place] [address], [place],  [address]   . Salicylate Lvl 41/66/0630 <7.0* 7.0 -  30.0 mg/dL Final   Performed at [facility], [place] [address], [place], [place] [address]  . Acetaminophen (Tylenol), Serum 12/15/2020 <10* 10 - 30 ug/mL Final   Comment: (NOTE) Therapeutic concentrations vary significantly. A range of 10-30 ug/mL  may be an effective concentration for many patients. However, some  are best treated at concentrations outside of this range. Acetaminophen concentrations >150 ug/mL at 4 hours after ingestion  and >50 ug/mL at 12 hours after ingestion are often associated with  toxic reactions.  Performed at [facility], [place] [address], [place], [place] [address]   . WBC 12/15/2020 9.5  4.0 - 10.5 K/uL Final  . RBC 12/15/2020 5.10  4.22 - 5.81 MIL/uL Final  . Hemoglobin 12/15/2020 14.0  13.0 - 17.0 g/dL Final  . HCT 12/15/2020 44.1  39.0 - 52.0 % Final  . MCV 12/15/2020 86.5  80.0 - 100.0 fL Final  . MCH 12/15/2020 27.5  26.0 - 34.0 pg Final  . MCHC 12/15/2020 31.7  30.0 - 36.0 g/dL Final  . RDW 12/15/2020 12.2  11.5 - 15.5 % Final  . Platelets 12/15/2020 322  150 - 400 K/uL Final  . nRBC 12/15/2020 0.0  0.0 - 0.2 % Final   Performed at [facility], [place] [address] [place], [place] [address]  . Opiates 12/15/2020 NONE DETECTED  NONE DETECTED Final  . Cocaine 12/15/2020 POSITIVE* NONE DETECTED Final  . Benzodiazepines 12/15/2020 NONE DETECTED  NONE DETECTED Final  . Amphetamines 12/15/2020 POSITIVE* NONE DETECTED Final  . Tetrahydrocannabinol 12/15/2020 NONE DETECTED  NONE DETECTED Final  . Barbiturates 12/15/2020 NONE DETECTED  NONE DETECTED Final   Comment: (NOTE) DRUG SCREEN FOR MEDICAL PURPOSES ONLY.  IF CONFIRMATION IS NEEDED FOR ANY PURPOSE, NOTIFY LAB WITHIN 5 DAYS.  LOWEST DETECTABLE LIMITS FOR URINE DRUG SCREEN Drug Class                     Cutoff (ng/mL) Amphetamine and metabolites    1000 Barbiturate and  metabolites    200 Benzodiazepine                 [id number] Tricyclics and metabolites     300 Opiates and metabolites        300 Cocaine and metabolites        300 THC                            50 Performed at [facility], [place] [address], [place], [place] [address]   . SARS Coronavirus 2 by RT PCR 12/15/2020 NEGATIVE  NEGATIVE Final   Comment: (NOTE) SARS-CoV-2 target nucleic acids are NOT DETECTED.  The SARS-CoV-2 RNA is generally detectable in upper respiratory specimens during the acute phase of infection. The lowest concentration of SARS-CoV-2 viral copies this assay can detect is 138 copies/mL. A negative result does not preclude SARS-Cov-2 infection and should not be used as the sole basis for treatment or other patient management decisions. A negative result may occur with  improper specimen collection/handling, submission of specimen other than nasopharyngeal swab, presence of viral mutation(s) within the areas targeted by this assay, and inadequate number of viral copies(<138 copies/mL). A negative result must be combined with clinical observations, patient history, and epidemiological information. The expected result is Negative.  Fact Sheet for Patients:  EntrepreneurPulse.com.au  Fact Sheet for Healthcare Providers:  IncredibleEmployment.be  This test is no  t yet approved or cleared by the [place] and  has been authorized for detection and/or diagnosis of SARS-CoV-2 by FDA under an Emergency Use Authorization (EUA). This EUA will remain  in effect (meaning this test can be used) for the duration of the COVID-19 declaration under Section 564(b)(1) of the Act, 21 U.S.C.section 360bbb-3(b)(1), unless the authorization is terminated  or revoked sooner.      . Influenza A by PCR 12/15/2020 NEGATIVE  NEGATIVE Final  . Influenza B by PCR 12/15/2020 NEGATIVE  NEGATIVE Final   Comment: (NOTE) The  Xpert Xpress SARS-CoV-2/FLU/RSV plus assay is intended as an aid in the diagnosis of influenza from Nasopharyngeal swab specimens and should not be used as a sole basis for treatment. Nasal washings and aspirates are unacceptable for Xpert Xpress SARS-CoV-2/FLU/RSV testing.  Fact Sheet for Patients: EntrepreneurPulse.com.au  Fact Sheet for Healthcare Providers: IncredibleEmployment.be  This test is not yet approved or cleared by the [place] FDA and has been authorized for detection and/or diagnosis of SARS-CoV-2 by FDA under an Emergency Use Authorization (EUA). This EUA will remain in effect (meaning this test can be used) for the duration of the COVID-19 declaration under Section 564(b)(1) of the Act, 21 U.S.C. section 360bbb-3(b)(1), unless the authorization is terminated or revoked.  Performed at [facility], [place] [address], [place], [place] [address]     Allergies: Amoxicillin  PTA Medications: (Not in a hospital admission)   Medical Decision Making  Patient admitted to Continuous Assessment Unit for Stabilization  Labs reviewed:  See above Medication Management:  Started Abilify 5 mg Q hs for mood stability and depression  Social work/TOC to assist with referral to long term rehab facilities    Recommendations  Based on my evaluation the patient does not appear to have an emergency medical condition.  Shantinique Picazo, NP 12/16/20  7:08 PM

## 2020-12-16 NOTE — ED Notes (Signed)
Per staff report, patient irritable and verbally aggressive while this writer was off unit.  Staff stated patient wasn't immediately given a bandaid.  Patient stated to this writer that he has mental health and addiction problems.  Per provider, patient given Ativan PO.  Patient also stated that he would like something for sleep.  Trazodone given per eMAR.

## 2020-12-16 NOTE — ED Notes (Signed)
Pt given sandwich 

## 2020-12-16 NOTE — ED Notes (Signed)
Patient refused to allow sitter and nurse to take vital signs

## 2020-12-16 NOTE — ED Notes (Signed)
Patient resting; eyes closed.  RR even and unlabored.

## 2020-12-16 NOTE — Progress Notes (Signed)
CSW was informed patient is going to [facility]. CSW provided patient with outpatient and inpatient substance use resources.

## 2020-12-16 NOTE — ED Notes (Signed)
This RN spoke to TTS about when patient was next on the list being that patient was to be seen earlier at 545pm. This RN was told that patient is on the list and they are trying to figure out when they can TTS him and they would get back in contact with me.

## 2020-12-16 NOTE — ED Notes (Signed)
Patient appears to be sleeping.  Resp even and unlabored.

## 2020-12-16 NOTE — ED Notes (Signed)
Pt sent to [facility] via safe transport; pt belongings given to [name], safe transport driver

## 2020-12-16 NOTE — Consult Note (Signed)
  Patient accepted to [facility] continuous assessment Unit

## 2020-12-16 NOTE — ED Notes (Signed)
Patient asking if he can have something for burning/discomfort in left hand.  Medicated per eMAR.

## 2020-12-16 NOTE — ED Notes (Addendum)
Refused temp to be checked

## 2020-12-16 NOTE — ED Notes (Addendum)
Pt notified that he has been accepted to [facility]; pt refusing vital signs, states "my blood pressure is fine"; this RN explained to pt that an updated set of vital signs are necessary for transport to [facility]; pt still refusing

## 2020-12-16 NOTE — ED Notes (Signed)
Report given to Velva Harman, Therapist, sports at [facility]

## 2020-12-16 NOTE — ED Notes (Signed)
TTS in progress 

## 2020-12-16 NOTE — ED Notes (Signed)
Placed Breakfast order 

## 2020-12-16 NOTE — Progress Notes (Signed)
Received Justin Chaney from [facility], he is alert and oriented. He was cooperative with VS assessment. He denied all of the psychiatric symptoms and stated he thought he was going to a rehab facility for drugs. He is requesting a detox facility and disappointed his stay hers is 23 hrs or less. He was oriented to his new environment, given nourishments and made comfortable.

## 2020-12-17 MED ORDER — ARIPIPRAZOLE 5 MG PO TABS: 5.0000 mg | ORAL_TABLET | Freq: Every day | ORAL | 0 refills | Status: DC

## 2020-12-17 NOTE — ED Notes (Signed)
Pt given breakfast.

## 2020-12-17 NOTE — ED Notes (Signed)
Patient sleeping. RR even and unlabored.

## 2020-12-17 NOTE — Progress Notes (Signed)
CSW attempted to meet with the patient to discuss resources and rehab at the request of Dr Serafina Mitchell.  Pt refused to meet with this CSW to discuss further.   Pt was yelling and CSW terminated the conversation.  Assunta Curtis, MSW, LCSW 12/17/2020 10:47 AM

## 2020-12-17 NOTE — Discharge Instructions (Signed)

## 2020-12-17 NOTE — Progress Notes (Signed)
Received Justin Chaney this AM asleep in his chair bed, he was awaken for VS assessment and to talk with the social worker x2. He was compliant with his medications.  He continued to sleep throughout the day. Later Safe Transport was called and arrived at 1510 hrs.

## 2020-12-17 NOTE — ED Provider Notes (Signed)
FBC/OBS ASAP Discharge Summary  Date and Time: 12/17/2020 3:04 PM  Name: Justin Chaney  MRN:  [id number]   Discharge Diagnoses:  Final diagnoses:  Malingering  Cocaine abuse ([place])  Amphetamine abuse ([place])  Substance induced mood disorder ([place])  Cannabis abuse    Subjective:  Chart reviewed and patient seen. Patient is irritable on interview. He denies SI/HI/AVH. He states that he wants rehab. SW was consulted to speak with him. SW attempted to see him and he declined to speak with them. After he declined to speak with them, I spoke with patient again and he states that he does not remember anyone attempting to speak with him. SW agreed to speak with him again and pt was agreeable to detox. Of note, even though patient is reporting that he wants assistance with substance use, he has been irritable and minimally engaged in efforts to assist him with substance use treatment. Some element of secondary gain is suspected.  Stay Summary:  Justin Chaney, 25 y.o., male patient presents to [facility] for direct admit to Continuous Assessment from [facility] ED with complaints of suicidal ideation with plan to overdose  On 5/17.  Patient seen face to face by this provider, consulted with Dr. Ernie Hew; and chart reviewed on 12/16/20.  On evaluation Justin Chaney reports he has overdosed on heroin 3 times in last 2 weeks.  States he wants to be restarted on his psychotropic medications but could not remember what he was taking "The only one I know is Abilify.  I took it for about 2 weeks."  Patient stats his living with friends and that his mother is his support system "My mom is all I got and she wants me to go to a residential treatment program."  Patient reports he is able to contract for safety if he gets into a treatment program.  Patient states he recently restarted using heroin again but his drug of choice is Meth.  States he is using meth daily.   During evaluation Justin Chaney is  alert/oriented x 4; calm/cooperative; with irritable mood and congruent with affect.  He does not appear to be responding to internal/external stimuli or delusional thoughts.  Patient denies homicidal ideation, psychosis, and paranoia.  Patient continues to endorse safety only if he is ale to get into a residential program.  Patient answered question appropriately.    Patient was started on abilify 5 mg and admitted for observation. The following day patient denied SI/HI/AVH and SW was consulted for substance use treatment options; pt agreeable to [place] [facility]. UDS+amphetamine, cocaine. Pt admitted to also using heroin.   Total Time spent with patient: 15 minutes  Past Psychiatric History: see h&P Past Medical History:  Past Medical History:  Diagnosis Date  . Asthma   . Bipolar disorder ([place])   . 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 Depression   . Homelessness   . Schizophrenia ([place])   . Seizures ([place])    childhood seizures  . Suicidal behavior   . Suicidal intent     Past Surgical History:  Procedure Laterality Date  . APPENDECTOMY    . SHUNT REMOVAL    . TEE WITHOUT CARDIOVERSION N/A 08/14/2019   Procedure: TRANSESOPHAGEAL ECHOCARDIOGRAM (TEE);  Surgeon: Pixie Casino, MD;  Location: [facility] ENDOSCOPY;  Service: Cardiovascular;  Laterality: N/A;  . TONSILLECTOMY    . VENTRICULO-PERITONEAL SHUNT PLACEMENT / LAPAROSCOPIC INSERTION PERITONEAL CATHETER     Family History:  Family History  Problem  Relation Age of Onset  . Hypertension Mother   . Mental illness Neg Hx    Family Psychiatric History: see H&P Social History:  Social History   Substance and Sexual Activity  Alcohol Use No     Social History   Substance and Sexual Activity  Drug Use Yes  . Types: Other-see comments, Amphetamines    Social History   Socioeconomic History  . Marital status: Single    Spouse name: Not on file  . Number of children: Not on file  . Years of  education: Not on file  . Highest education level: Not on file  Occupational History  . Not on file  Tobacco Use  . Smoking status: Current Every Day Smoker    Packs/day: 0.00    Types: Cigars  . Smokeless tobacco: Never Used  Vaping Use  . Vaping Use: Never used  Substance and Sexual Activity  . Alcohol use: No  . Drug use: Yes    Types: Other-see comments, Amphetamines  . Sexual activity: Not on file  Other Topics Concern  . Not on file  Social History Narrative  . Not on file   Social Determinants of Health   Financial Resource Strain: Not on file  Food Insecurity: Not on file  Transportation Needs: Not on file  Physical Activity: Not on file  Stress: Not on file  Social Connections: Not on file   SDOH:  SDOH Screenings   Alcohol Screen: Not on file  Depression (KGU5-4): Not on file  Financial Resource Strain: Not on file  Food Insecurity: Not on file  Housing: Not on file  Physical Activity: Not on file  Social Connections: Not on file  Stress: Not on file  Tobacco Use: High Risk  . Smoking Tobacco Use: Current Every Day Smoker  . Smokeless Tobacco Use: Never Used  Transportation Needs: Not on file    Has this patient used any form of tobacco in the last 30 days? (Cigarettes, Smokeless Tobacco, Cigars, and/or Pipes) Prescription not provided because: n/a  Current Medications:  Current Facility-Administered Medications  Medication Dose Route Frequency Provider Last Rate Last Admin  . acetaminophen (TYLENOL) tablet 650 mg  650 mg Oral Q6H PRN Rankin, Shuvon B, NP   650 mg at 12/16/20 2114  . alum & mag hydroxide-simeth (MAALOX/MYLANTA) 200-200-20 MG/5ML suspension 30 mL  30 mL Oral Q4H PRN Rankin, Shuvon B, NP      . ARIPiprazole (ABILIFY) tablet 5 mg  5 mg Oral Daily Rankin, Shuvon B, NP   5 mg at 12/17/20 1104  . hydrOXYzine (ATARAX/VISTARIL) tablet 25 mg  25 mg Oral TID PRN Rozetta Nunnery, NP   25 mg at 12/16/20 2114  . magnesium hydroxide (MILK OF  MAGNESIA) suspension 30 mL  30 mL Oral Daily PRN Rankin, Shuvon B, NP      . traZODone (DESYREL) tablet 50 mg  50 mg Oral QHS PRN,MR X 1 Rozetta Nunnery, NP       Current Outpatient Medications  Medication Sig Dispense Refill  . ARIPiprazole (ABILIFY) 5 MG tablet Take 1 tablet (5 mg total) by mouth daily. 30 tablet 0    PTA Medications: (Not in a hospital admission)   Musculoskeletal  Strength & Muscle Tone: within normal limits Gait & Station: normal Patient leans: N/A  Psychiatric Specialty Exam  Presentation  General Appearance: Appropriate for Environment; Casual  Eye Contact:Minimal  Speech:Clear and Coherent; Normal Rate  Speech Volume:Normal  Handedness:Right   Mood and Affect  Mood:Irritable  Affect:Congruent; Appropriate   Thought Process  Thought Processes:Coherent; Goal Directed; Linear  Descriptions of Associations:Intact  Orientation:Full (Time, Place and Person)  Thought Content:WDL     Hallucinations:Hallucinations: None  Ideas of Reference:None  Suicidal Thoughts:Suicidal Thoughts: No SI Active Intent and/or Plan: With Intent; With Plan; With Means to Carry Out (Patient stating he has had 3 overdoses in past 2 weekk via [place].  At first stated accidental then when thought he was going home said intentional)  Homicidal Thoughts:Homicidal Thoughts: No   Sensorium  Memory:Immediate Good; Recent Good; Remote Good  Judgment:Fair  Insight:Fair   Executive Functions  Concentration:Fair  Attention Span:Fair  Recall:Fair  Fund of Knowledge:Fair  Language:Good   Psychomotor Activity  Psychomotor Activity:Psychomotor Activity: Normal   Assets  Assets:Communication Skills; Resilience; Desire for Improvement   Sleep  Sleep:Sleep: Fair   Nutritional Assessment (For OBS and FBC admissions only) Has the patient had a weight loss or gain of 10 pounds or more in the last 3 months?: No Has the patient had a decrease in food  intake/or appetite?: No Does the patient have dental problems?: No Does the patient have eating habits or behaviors that may be indicators of an eating disorder including binging or inducing vomiting?: No Has the patient recently lost weight without trying?: No Has the patient been eating poorly because of a decreased appetite?: No Malnutrition Screening Tool Score: 0    Physical Exam  Physical Exam Constitutional:      Appearance: Normal appearance. He is normal weight.  HENT:     Head: Normocephalic and atraumatic.  Eyes:     Extraocular Movements: Extraocular movements intact.  Pulmonary:     Effort: Pulmonary effort is normal.  Neurological:     General: No focal deficit present.     Mental Status: He is alert and oriented to person, place, and time.  Psychiatric:        Attention and Perception: Attention and perception normal.        Speech: Speech normal.        Behavior: Behavior is cooperative.    Review of Systems  Constitutional: Negative for chills and fever.  HENT: Negative for hearing loss.   Eyes: Negative for discharge and redness.  Respiratory: Negative for cough.   Cardiovascular: Negative for chest pain.  Gastrointestinal: Positive for nausea. Negative for abdominal pain.  Musculoskeletal: Negative for myalgias.  Neurological: Negative for headaches.   Blood pressure (!) 133/94, pulse 93, temperature 98.7 F (37.1 C), temperature source Oral, resp. rate 16, SpO2 99 %. There is no height or weight on file to calculate BMI.  Demographic Factors:  Male and Low socioeconomic status  Loss Factors: Financial problems/change in socioeconomic status  Historical Factors: Impulsivity and substance use  Risk Reduction Factors:   Positive social support  Continued Clinical Symptoms:  Alcohol/Substance Abuse/Dependencies Previous Psychiatric Diagnoses and Treatments  Cognitive Features That Contribute To Risk:  Thought constriction (tunnel vision)     Suicide Risk:  Minimal: No identifiable suicidal ideation.  Patients presenting with no risk factors but with morbid ruminations; may be classified as minimal risk based on the severity of the depressive symptoms  Plan Of Care/Follow-up recommendations:  Activity:  as tolerated Diet:  regular Other:      Patient is instructed prior to discharge to: Take all medications as prescribed by his/her mental healthcare provider. Report any adverse effects and or reactions from the medicines to his/her outpatient provider promptly. Patient has been instructed & cautioned:  To not engage in alcohol and or illegal drug use while on prescription medicines. In the event of worsening symptoms, patient is instructed to call the crisis hotline, 911 and or go to the nearest ED for appropriate evaluation and treatment of symptoms. To follow-up with his/her primary care provider for your other medical issues, concerns and or health care needs.   Patient discharged with 14 day samples of abilify 5 mg and given paper script for #30 Suspect that there was an element of secondary gain-likely housing. Patient was minimally engaged with SW and myself while discussing substance use treatment.  Disposition:  SW consulted for assistance, agreeable to go to detox in [place], MD 12/17/2020, 3:04 PM

## 2020-12-17 NOTE — Progress Notes (Signed)
CSW assisted the patient in identifying potential rehab facilities.  CSW notes that pt was abrasive and argumentative as CSW attempted to explain rehabs that were available and necessary next steps.  CSW contact the following with the patient:  [facility] CSW left HIPAA compliant voicemail requesting return call  [facility] on lunch break, asked pt to call back at 1:15PM.  Pt not aligned with calling back and completing the interview as he thinks CSW should do it, however, it was explained multiple times that pt has to complete the interview with no input from CSW.  [facility] No beds available but requested that the pt have a referral completed anyway.  Pt declined to have referral sent.  [employer] CSW left HIPAA compliant voicemail.  [facility] No beds available at this time.  Pt could be placed on waitlist but patient is refusing to be placed on a waitlist.  [facility] are available. Pt requesting to go there at this time.  Assunta Curtis, MSW, LCSW 12/17/2020 12:23 PM

## 2020-12-17 NOTE — ED Notes (Signed)
Patient given cereal.

## 2021-01-10 ENCOUNTER — Emergency Department (HOSPITAL_COMMUNITY)
Admission: EM | Admit: 2021-01-10 | Discharge: 2021-01-10 | Disposition: A | Discharge status: Home / Self Care | Payer: Medicaid Other | Attending: Emergency Medicine | Admitting: Emergency Medicine

## 2021-01-10 ENCOUNTER — Other Ambulatory Visit: Payer: Self-pay

## 2021-01-10 ENCOUNTER — Encounter (HOSPITAL_COMMUNITY): Payer: Self-pay | Admitting: *Deleted

## 2021-01-10 DIAGNOSIS — F1593 Other stimulant use, unspecified with withdrawal: Secondary | ICD-10-CM | POA: Insufficient documentation

## 2021-01-10 DIAGNOSIS — Z5321 Procedure and treatment not carried out due to patient leaving prior to being seen by health care provider: Secondary | ICD-10-CM | POA: Insufficient documentation

## 2021-01-10 NOTE — ED Notes (Signed)
Called pt to recheck vitals. No response.  

## 2021-01-10 NOTE — ED Triage Notes (Signed)
The pt wants to be detoxed from meth  he last had this am

## 2021-01-10 NOTE — ED Notes (Signed)
2x calling pt to recheck vitals. No response.  

## 2021-01-11 ENCOUNTER — Emergency Department (HOSPITAL_COMMUNITY)
Admission: EM | Admit: 2021-01-11 | Discharge: 2021-01-12 | Disposition: A | Discharge status: Home / Self Care | Payer: Medicaid Other | Attending: Emergency Medicine | Admitting: Emergency Medicine

## 2021-01-11 ENCOUNTER — Other Ambulatory Visit: Payer: Self-pay

## 2021-01-11 ENCOUNTER — Encounter (HOSPITAL_COMMUNITY): Payer: Self-pay | Admitting: *Deleted

## 2021-01-11 DIAGNOSIS — Z5321 Procedure and treatment not carried out due to patient leaving prior to being seen by health care provider: Secondary | ICD-10-CM | POA: Insufficient documentation

## 2021-01-11 DIAGNOSIS — F19230 Other psychoactive substance dependence with withdrawal, uncomplicated: Secondary | ICD-10-CM | POA: Diagnosis not present

## 2021-01-11 NOTE — ED Triage Notes (Signed)
The pt wants to be deroxed from meth and cocaine  he last had yesterday  he was here in the ed yesteerday but left without being seem

## 2021-01-12 NOTE — ED Notes (Signed)
Called pt X4 and checked outside for room, no answer. Will try again in a few minutes.

## 2021-01-12 NOTE — ED Notes (Signed)
Patient called multiple times for vitals with no response.

## 2021-01-12 NOTE — ED Notes (Signed)
Patient came in from outside. When asked if he still wanted to be seen he asked he asked where the cafeteria was. This Probation officer explained that the cafeteria was not open yet. Patient states he still wants to be seen but is going back outside and doesn't want vitals.

## 2021-01-12 NOTE — ED Notes (Signed)
Pt called again, checked outside and still unable to find pt, moving OTF.

## 2021-01-14 ENCOUNTER — Ambulatory Visit (HOSPITAL_COMMUNITY)
Admission: AD | Admit: 2021-01-14 | Discharge: 2021-01-14 | Disposition: A | Discharge status: Home / Self Care | Payer: Medicaid Other | Source: Home / Self Care

## 2021-01-14 NOTE — H&P (Signed)
Behavioral Health Medical Screening Exam  Justin Chaney is a 25 y.o. male with psychiatric hx of schizoaffective disorder, bipolar type, polystucance abuse . Patient presented to [facility] voluntarily requesting assistance with "detox"  and anger management. Patient report polysubstance abuse; he report using marijuana, MDNA, and cocaine daily. He reports he last used illicit drugs approximately 4 days ago. He reports that he has tried multiple drug rehabilitation program in the past without success.  He denies depressive mood, SI, HI, paranoia, and no delusional thought content noted during assessment. He endorsing baseline visual hallucination of "seeing good and bad spirit." He report that the "bad and good spirit" are not encouarging him to self-harm, cause harm to others, or intrusive. Patient denies auditory hallucination.    On assessment, patient is alert and oriented. Patient is irritable but cooperative with assessment and was able to answer questions appropriately. Patient does not appear to be psychotic, he is not responding to internal/external stimuli. He denies SI, HI, paranoia, and there was no indication of delusional thought content. He denies acute illness, chest pain, SOB, fever, chills, GI or GU symptoms.  Total Time spent with patient: 30 minutes  Psychiatric Specialty Exam:  Presentation  General Appearance: Fairly Groomed  Eye Contact:Good  Speech:Clear and Coherent  Speech Volume:Normal  Handedness:Right   Mood and Affect  Mood:Irritable  Affect:Congruent   Thought Process  Thought Processes:Coherent; Goal Directed  Descriptions of Associations:Intact  Orientation:Full (Time, Place and Person)  Thought Content:WDL  History of Schizophrenia/Schizoaffective disorder:No data recorded Duration of Psychotic Symptoms:-- (Pt reports for a long time.) Hallucinations:Hallucinations: Visual Description of Visual Hallucinations: "seeing good and bad spirit"  Ideas  of Reference:None  Suicidal Thoughts:Suicidal Thoughts: No  Homicidal Thoughts:Homicidal Thoughts: No   Sensorium  Memory:Immediate Good; Remote Good; Recent Good  Judgment:Fair  Insight:Fair   Executive Functions  Concentration:Fair  Attention Span:Fair  [place]  Language:Good   Psychomotor Activity  Psychomotor Activity:Psychomotor Activity: Increased   Assets  Assets:Communication Skills; Physical Health; Financial Resources/Insurance   Sleep  Sleep:Sleep: Fair Number of Hours of Sleep: 6    Physical Exam: Physical Exam Constitutional:      Appearance: He is obese. He is not ill-appearing, toxic-appearing or diaphoretic.  HENT:     Head: Normocephalic.     Nose: No congestion or rhinorrhea.     Mouth/Throat:     Mouth: Mucous membranes are moist.  Eyes:     General:        Right eye: No discharge.        Left eye: No discharge.  Cardiovascular:     Rate and Rhythm: Normal rate.  Pulmonary:     Effort: Pulmonary effort is normal. No respiratory distress.     Breath sounds: No wheezing or rales.  Musculoskeletal:     Cervical back: Normal range of motion.  Skin:    General: Skin is warm and dry.  Neurological:     Mental Status: He is alert and oriented to person, place, and time.  Psychiatric:        Attention and Perception: Attention normal. He perceives visual hallucinations. He does not perceive auditory hallucinations.        Mood and Affect: Affect is angry.        Speech: Speech normal.        Behavior: Behavior normal. Behavior is cooperative.        Thought Content: Thought content normal. Thought content is not paranoid or delusional. Thought content does  not include homicidal or suicidal ideation. Thought content does not include homicidal or suicidal plan.        Cognition and Memory: Cognition normal.   Review of Systems  Constitutional: Negative.  Negative for chills and fever.  HENT: Negative.     Eyes:  Negative for blurred vision, double vision, photophobia, pain, discharge and redness.  Respiratory: Negative.  Negative for cough, hemoptysis, sputum production, shortness of breath and wheezing.   Cardiovascular: Negative.  Negative for chest pain, palpitations and leg swelling.  Gastrointestinal: Negative.  Negative for abdominal pain, constipation, diarrhea, heartburn, nausea and vomiting.  Genitourinary: Negative.   Musculoskeletal: Negative.   Skin:        scabs  Neurological: Negative.   Endo/Heme/Allergies: Negative.   Psychiatric/Behavioral:  Positive for hallucinations and substance abuse. Negative for depression and suicidal ideas.   There were no vitals taken for this visit. There is no height or weight on file to calculate BMI.  Musculoskeletal: Strength & Muscle Tone: within normal limits Gait & Station: normal Patient leans: Right   Recommendations:  Based on my evaluation the patient does not appear to have an emergency medical condition. Patient was given out patient resources for drug rehabilitation and therapy.    Ophelia Shoulder, NP 01/15/2021, 1:59 AM

## 2021-01-15 ENCOUNTER — Emergency Department (HOSPITAL_COMMUNITY)
Admission: EM | Admit: 2021-01-15 | Discharge: 2021-01-15 | Discharge status: Against Medical Advice | Payer: Medicaid Other | Attending: Mental Health | Admitting: Mental Health
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
## 2021-11-09 ENCOUNTER — Emergency Department (HOSPITAL_COMMUNITY)
Admission: EM | Admit: 2021-11-09 | Discharge: 2021-11-10 | Disposition: A | Discharge status: Home / Self Care | Payer: Medicaid Other | Attending: Emergency Medicine | Admitting: Emergency Medicine

## 2021-11-09 ENCOUNTER — Encounter (HOSPITAL_COMMUNITY): Payer: Self-pay

## 2021-11-09 ENCOUNTER — Other Ambulatory Visit: Payer: Self-pay

## 2021-11-09 DIAGNOSIS — Z5900 Homelessness unspecified: Secondary | ICD-10-CM | POA: Insufficient documentation

## 2021-11-09 DIAGNOSIS — Z20822 Contact with and (suspected) exposure to covid-19: Secondary | ICD-10-CM | POA: Insufficient documentation

## 2021-11-09 DIAGNOSIS — F29 Unspecified psychosis not due to a substance or known physiological condition: Secondary | ICD-10-CM | POA: Insufficient documentation

## 2021-11-09 DIAGNOSIS — F200 Paranoid schizophrenia: Secondary | ICD-10-CM | POA: Insufficient documentation

## 2021-11-09 LAB — CBC WITH DIFFERENTIAL/PLATELET
Abs Immature Granulocytes: 0.02 10*3/uL (ref 0.00–0.07)
Basophils Absolute: 0 10*3/uL (ref 0.0–0.1)
Basophils Relative: 1 %
Eosinophils Absolute: 0.1 10*3/uL (ref 0.0–0.5)
Eosinophils Relative: 1 %
HCT: 39.4 % (ref 39.0–52.0)
Hemoglobin: 13.2 g/dL (ref 13.0–17.0)
Immature Granulocytes: 0 %
Lymphocytes Relative: 21 %
Lymphs Abs: 1.8 10*3/uL (ref 0.7–4.0)
MCH: 28.3 pg (ref 26.0–34.0)
MCHC: 33.5 g/dL (ref 30.0–36.0)
MCV: 84.5 fL (ref 80.0–100.0)
Monocytes Absolute: 1.1 10*3/uL — ABNORMAL HIGH (ref 0.1–1.0)
Monocytes Relative: 13 %
Neutro Abs: 5.6 10*3/uL (ref 1.7–7.7)
Neutrophils Relative %: 64 %
Platelets: 263 10*3/uL (ref 150–400)
RBC: 4.66 MIL/uL (ref 4.22–5.81)
RDW: 12.1 % (ref 11.5–15.5)
WBC: 8.7 10*3/uL (ref 4.0–10.5)
nRBC: 0 % (ref 0.0–0.2)

## 2021-11-09 LAB — ETHANOL: Alcohol, Ethyl (B): <10 mg/dL (ref <10)

## 2021-11-09 LAB — COMPREHENSIVE METABOLIC PANEL
ALT: 157 U/L — ABNORMAL HIGH (ref 0–44)
AST: 103 U/L — ABNORMAL HIGH (ref 15–41)
Albumin: 4 g/dL (ref 3.5–5.0)
Alkaline Phosphatase: 48 U/L (ref 38–126)
Anion gap: 6 (ref 5–15)
BUN: 8 mg/dL (ref 6–20)
CO2: 27 mmol/L (ref 22–32)
Calcium: 8.8 mg/dL — ABNORMAL LOW (ref 8.9–10.3)
Chloride: 106 mmol/L (ref 98–111)
Creatinine, Ser: 0.65 mg/dL (ref 0.61–1.24)
GFR, Estimated: >60 mL/min (ref >60)
Glucose, Bld: 141 mg/dL — ABNORMAL HIGH (ref 70–99)
Potassium: 2.9 mmol/L — ABNORMAL LOW (ref 3.5–5.1)
Sodium: 139 mmol/L (ref 135–145)
Total Bilirubin: 1.3 mg/dL — ABNORMAL HIGH (ref 0.3–1.2)
Total Protein: 7 g/dL (ref 6.5–8.1)

## 2021-11-09 LAB — RESP PANEL BY RT-PCR (FLU A&B, COVID) ARPGX2
Influenza A by PCR: NEGATIVE
Influenza B by PCR: NEGATIVE
SARS Coronavirus 2 by RT PCR: NEGATIVE

## 2021-11-09 LAB — ACETAMINOPHEN LEVEL: Acetaminophen (Tylenol), Serum: <10 ug/mL — ABNORMAL LOW (ref 10–30)

## 2021-11-09 LAB — SALICYLATE LEVEL: Salicylate Lvl: <7 mg/dL — ABNORMAL LOW (ref 7.0–30.0)

## 2021-11-09 LAB — CBG MONITORING, ED: Glucose-Capillary: 121 mg/dL — ABNORMAL HIGH (ref 70–99)

## 2021-11-09 LAB — MAGNESIUM: Magnesium: 1.8 mg/dL (ref 1.7–2.4)

## 2021-11-09 MED ORDER — ARIPIPRAZOLE 5 MG PO TABS
5.0000 mg | ORAL_TABLET | Freq: Every day | ORAL | Status: DC
  Filled 2021-11-09: qty 1

## 2021-11-09 MED ORDER — ZIPRASIDONE MESYLATE 20 MG IM SOLR
20.0000 mg | Freq: Once | INTRAMUSCULAR | Status: AC
  Administered 2021-11-09: 20 mg via INTRAMUSCULAR
  Filled 2021-11-09: qty 20

## 2021-11-09 MED ORDER — POTASSIUM CHLORIDE CRYS ER 20 MEQ PO TBCR
40.0000 meq | EXTENDED_RELEASE_TABLET | Freq: Once | ORAL | Status: AC
  Administered 2021-11-09: 40 meq via ORAL
  Filled 2021-11-09: qty 2

## 2021-11-09 NOTE — ED Notes (Signed)
Pt is dressed out. Pt refusing to turn over belongings to staff. ?

## 2021-11-09 NOTE — BH Assessment (Signed)
TTS clinician attempted to complete assessment. Per Hulen Shouts, RN, patient medicated, unable to arouse. TTS will attempt at later time.  ?
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
## 2021-11-09 NOTE — ED Provider Notes (Addendum)
?[facility] DEPT ?Provider Note ? ? ?CSN: [id number] ?Arrival date & time: 11/09/21  1919 ? ?  ? ?History ? ?Chief Complaint  ?Patient presents with  ? Mental Health Problem  ? ? ?Justin Chaney is a 26 y.o. male. ? ? ?Mental Health Problem ? ?26 year old male with a history of schizophrenia and homelessness presenting to the emergency department with concern for decompensation.  The patient arrives complaining of conspiracies that his mother and sister were murdered.  He states that he was being injected with an unknown substance called "MSN" and is being attacked by spirits.  He is unable to provide further HPI and constantly talks with pressured and tangential speech. ? ?Home Medications ?Prior to Admission medications   ?Medication Sig Start Date End Date Taking? Authorizing Provider  ?ARIPiprazole (ABILIFY) 5 MG tablet Take 1 tablet (5 mg total) by mouth daily. 12/17/20   Ival Bible, MD  ?   ? ?Allergies    ?Amoxicillin   ? ?Review of Systems   ?Review of Systems  ?Unable to perform ROS: Psychiatric disorder  ? ?Physical Exam ?Updated Vital Signs ?BP (!) 124/100   Pulse (!) 137   Temp 99.4 ?F (37.4 ?C) (Oral)   Resp 19   SpO2 99%  ?Physical Exam ?Vitals and nursing note reviewed.  ?Constitutional:   ?   General: He is not in acute distress. ?HENT:  ?   Head: Normocephalic and atraumatic.  ?Eyes:  ?   Conjunctiva/sclera: Conjunctivae normal.  ?   Pupils: Pupils are equal, round, and reactive to light.  ?Cardiovascular:  ?   Rate and Rhythm: Regular rhythm. Tachycardia present.  ?   Heart sounds: Normal heart sounds.  ?Pulmonary:  ?   Effort: Pulmonary effort is normal. No respiratory distress.  ?   Breath sounds: Normal breath sounds.  ?Abdominal:  ?   General: There is no distension.  ?   Tenderness: There is no guarding.  ?Musculoskeletal:     ?   General: No deformity or signs of injury.  ?   Cervical back: Neck supple.  ?Skin: ?   Findings: No lesion or rash.   ?Neurological:  ?   General: No focal deficit present.  ?   Mental Status: He is alert. Mental status is at baseline.  ?Psychiatric:     ?   Attention and Perception: He is inattentive. He perceives visual hallucinations.     ?   Mood and Affect: Mood is anxious. Affect is labile.     ?   Speech: Speech is rapid and pressured and tangential.     ?   Behavior: Behavior is uncooperative and hyperactive.  ?   Comments: Unable to assess thought content as patient is uncooperative and hyperactive  ? ? ?ED Results / Procedures / Treatments   ?Labs ?(all labs ordered are listed, but only abnormal results are displayed) ?Labs Reviewed  ?CBC WITH DIFFERENTIAL/PLATELET - Abnormal; Notable for the following components:  ?    Result Value  ? Monocytes Absolute 1.1 (*)   ? All other components within normal limits  ?COMPREHENSIVE METABOLIC PANEL - Abnormal; Notable for the following components:  ? Potassium 2.9 (*)   ? Glucose, Bld 141 (*)   ? Calcium 8.8 (*)   ? AST 103 (*)   ? ALT 157 (*)   ? Total Bilirubin 1.3 (*)   ? All other components within normal limits  ?ACETAMINOPHEN LEVEL - Abnormal; Notable for the following components:  ?  Acetaminophen (Tylenol), Serum <10 (*)   ? All other components within normal limits  ?SALICYLATE LEVEL - Abnormal; Notable for the following components:  ? Salicylate Lvl <8.2 (*)   ? All other components within normal limits  ?RESP PANEL BY RT-PCR (FLU A&B, COVID) ARPGX2  ?ETHANOL  ?MAGNESIUM  ?RAPID URINE DRUG SCREEN, HOSP PERFORMED  ?CBG MONITORING, ED  ? ? ?EKG ?None ? ?Radiology ?No results found. ? ?Procedures ?Procedures  ? ? ?Medications Ordered in ED ?Medications  ?potassium chloride SA (KLOR-CON M) CR tablet 40 mEq (has no administration in time range)  ?ziprasidone (GEODON) injection 20 mg (has no administration in time range)  ? ? ?ED Course/ Medical Decision Making/ A&P ?  ?                        ?Medical Decision Making ?Amount and/or Complexity of Data Reviewed ?Labs:  ordered. ? ?Risk ?Prescription drug management. ? ? ?26 year old male with a history of schizophrenia and homelessness presenting to the emergency department with concern for decompensation.  The patient arrives complaining of conspiracies that his mother and sister were murdered.  He states that he was being injected with an unknown substance called "MSN" and is being attacked by spirits.  He is unable to provide further HPI and constantly talks with pressured and tangential speech. ? ?On arrival, concern for decompensated schizophrenia.  Labs significant for hypokalemia to 2.9.  Replenished orally.  IVC paperwork filled out.  Home medications reviewed, patient should be on Abilify 5 mg tablets daily.  We will administer IM Geodon as I am not certain the patient will cooperate with staff.  Medically cleared for TTS consultation. ? ? ?Final Clinical Impression(s) / ED Diagnoses ?Final diagnoses:  ?Paranoid schizophrenia ([place])  ?Psychosis, unspecified psychosis type ([place])  ? ? ?Rx / DC Orders ?ED Discharge Orders   ? ? None  ? ?  ? ? ?  ?Regan Lemming, MD ?11/09/21 2044 ? ?  ?Regan Lemming, MD ?11/09/21 2052 ? ?  ?Regan Lemming, MD ?11/10/21 1504 ? ?

## 2021-11-10 DIAGNOSIS — F200 Paranoid schizophrenia: Secondary | ICD-10-CM

## 2021-11-10 DIAGNOSIS — F29 Unspecified psychosis not due to a substance or known physiological condition: Secondary | ICD-10-CM

## 2021-11-10 LAB — RAPID URINE DRUG SCREEN, HOSP PERFORMED
Amphetamines: POSITIVE — AB
Barbiturates: NOT DETECTED
Benzodiazepines: NOT DETECTED
Cocaine: NOT DETECTED
Opiates: NOT DETECTED
Tetrahydrocannabinol: NOT DETECTED

## 2021-11-10 NOTE — ED Notes (Signed)
Pt's lunch has arrived, given to pt. Pt standing up and eating his lunch.  ?

## 2021-11-10 NOTE — ED Notes (Signed)
Pt was noted to be repeatedly flushing commode.  Security aware.  Pt is, again, aggressively pacing around the room.    ? ?  ?

## 2021-11-10 NOTE — ED Notes (Signed)
Writer went into the room and told pt that we had to move him to another area, Probation officer did accidentally say 'room', but did say that pt was going to [facility] E. Pt became agitated and attempted to walk away through the double doors headed to another unit. Pt had been ignoring this Probation officer, Therapist, sports, psychiatry and any other staff members but once pt was moved in [facility] E, pt became rude. EMT came to help with redirecting pt, after pt stated "I am going back to my room". EMT explained to pt why he had to be in [facility] E instead of a room now. Pt asked the same questions; "why am I out on the hallway?", "Why am I not in my room?", "elaborate why I am no longer in the room". EMT explained to pt 3 times as to why pt must be on a hallway bed. Pt back on bed, security at bedside.  ?

## 2021-11-10 NOTE — ED Notes (Signed)
All personal items returned to Pt including 1 Pt belongings bag and black back pack.  This writer went over d/c papers and Pt verbalized understanding.  Pt provided a bus pass.  ?

## 2021-11-10 NOTE — Discharge Instructions (Signed)
[facility] [place] [phone] ?

## 2021-11-10 NOTE — ED Notes (Signed)
Per psych team, they have not been able to assess the Pt.  They will make another attempt shortly.  ?

## 2021-11-10 NOTE — ED Provider Notes (Signed)
Emergency Medicine Observation Re-evaluation Note ? ?Justin Chaney is a 26 y.o. male, seen on rounds today at 0700.  Pt initially presented to the ED for complaints of Mental Health Problem ?Currently, the patient is resting comfortably. ? ?Physical Exam  ?BP 126/87 (BP Location: Right Arm)   Pulse 93   Temp 98.5 ?F (36.9 ?C) (Oral)   Resp 20   SpO2 99%  ?Physical Exam ?General: NAD ? ? ?ED Course / MDM  ?EKG:  ? ?I have reviewed the labs performed to date as well as medications administered while in observation.  Recent changes in the last 24 hours include no acute events reported. ? ?Plan  ?Current plan is for TTS Evaluation. ?Justin Chaney is under involuntary commitment. ?  ? ?  ?Valarie Merino, MD ?11/10/21 [phone] ? ?

## 2021-11-10 NOTE — ED Notes (Signed)
Pt continues to be verbally aggressive, disrespectful, and uncooperative.  Pt will not answer questions for psych provider or this Probation officer.         ?

## 2021-11-10 NOTE — ED Notes (Signed)
Pt's belongings placed into locker #31 in [facility]. Pt had a black bag and 1 patients belongings bag placed into the locker. RN made aware ?

## 2021-11-10 NOTE — ED Notes (Signed)
Pt pacing around room, writer told pt that he could close his door. Pt had door closed but opened it back up and requested to take a shower. However writer is aware that TTS wants to talk to the pt, pt continues to say "I don't want too. I am fine. I don't need to talk to anyone. I want to leave". Security redirected pt back into his room ?

## 2021-11-10 NOTE — ED Notes (Signed)
Pt noted to be pacing around room in an aggressive fashion.  ?

## 2021-11-10 NOTE — Consult Note (Signed)
[facility] Psych ED Discharge ? ?11/10/2021 1:16 PM ?Justin Chaney  ?MRN:  [id number] ? ?Method of visit?: Face to Face  ? ?Principal Problem: <principal problem not specified> ?Discharge Diagnoses: Active Problems: ?  * No active hospital problems. * ? ? ?Subjective: AA male, 26 year old male with a history of schizophrenia and homelessness presenting to the emergency department with concern for decompensation.  Per ER Note he was medicated with Geodon last night for not cooperating with staff and did not want to take his Abilify oral dose.  He slept and woke up this morning and refusing to speak with Mental health provider.  Second and third attempt to assess patient failed as he only answered questions with questions of his own.  He only stated that " I took too much drugs, I took too much Acid"  He then stated that he needed to be discharged.  Provider insisted in talking to patient to find out what he needed but he refused to engage.  He denied SI/HI/AVH by saying no to all questions and instructed provider to stop asking stupid questions.  Patient is discharged and EDP to rescind IVC. ? ?Total Time spent with patient: 20 minutes ? ?Past Psychiatric History: schizophrenia, Bipolar Disorder, Suicide Behavior and Substance abuse ? ?Past Medical History:  ?Past Medical History:  ?Diagnosis Date  ? Asthma   ? Bipolar disorder ([place])   ? 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 hydrochelpis  ? Depression   ? Homelessness   ? Schizophrenia ([place])   ? Seizures ([place])   ? childhood seizures  ? Suicidal behavior   ? Suicidal intent   ?  ?Past Surgical History:  ?Procedure Laterality Date  ? APPENDECTOMY    ? SHUNT REMOVAL    ? TEE WITHOUT CARDIOVERSION N/A 08/14/2019  ? Procedure: TRANSESOPHAGEAL ECHOCARDIOGRAM (TEE);  Surgeon: Pixie Casino, MD;  Location: [facility] ENDOSCOPY;  Service: Cardiovascular;  Laterality: N/A;  ? TONSILLECTOMY    ? VENTRICULO-PERITONEAL SHUNT PLACEMENT / LAPAROSCOPIC INSERTION PERITONEAL CATHETER     ? ?Family History:  ?Family History  ?Problem Relation Age of Onset  ? Hypertension Mother   ? Mental illness Neg Hx   ? ?Family Psychiatric  History: Unable to obtain ?Social History:  ?Social History  ? ?Substance and Sexual Activity  ?Alcohol Use No  ?   ?Social History  ? ?Substance and Sexual Activity  ?Drug Use Yes  ? Types: Other-see comments, Amphetamines  ?  ?Social History  ? ?Socioeconomic History  ? Marital status: Single  ?  Spouse name: Not on file  ? Number of children: Not on file  ? Years of education: Not on file  ? Highest education level: Not on file  ?Occupational History  ? Not on file  ?Tobacco Use  ? Smoking status: Every Day  ?  Packs/day: 0.00  ?  Types: Cigars, Cigarettes  ? Smokeless tobacco: Never  ?Vaping Use  ? Vaping Use: Never used  ?Substance and Sexual Activity  ? Alcohol use: No  ? Drug use: Yes  ?  Types: Other-see comments, Amphetamines  ? Sexual activity: Not on file  ?Other Topics Concern  ? Not on file  ?Social History Narrative  ? Not on file  ? ?Social Determinants of Health  ? ?Financial Resource Strain: Not on file  ?Food Insecurity: Not on file  ?Transportation Needs: Not on file  ?Physical Activity: Not on file  ?Stress: Not on file  ?Social Connections: Not on file  ? ? ?  Tobacco Cessation:  Prescription not provided because: patient refused to answer question regarding his smoking status. ? ?Current Medications: ?Current Facility-Administered Medications  ?Medication Dose Route Frequency Provider Last Rate Last Admin  ? ARIPiprazole (ABILIFY) tablet 5 mg  5 mg Oral Daily Regan Lemming, MD      ? ?Current Outpatient Medications  ?Medication Sig Dispense Refill  ? ARIPiprazole (ABILIFY) 5 MG tablet Take 1 tablet (5 mg total) by mouth daily. (Patient not taking: Reported on 11/09/2021) 30 tablet 0  ? ?PTA Medications: ?(Not in a hospital admission) ? ? ?Musculoskeletal: ?Strength & Muscle Tone: within normal limits ?Gait & Station: normal ?Patient leans:  Front ? ?Psychiatric Specialty Exam: ? ?Presentation  ?General Appearance: Fairly Groomed ? ?Eye Contact:Good ? ?Speech:Clear and Coherent ? ?Speech Volume:Normal ? ?Handedness:Right ? ? ?Mood and Affect  ?Mood:Irritable ? ?Affect:Congruent ? ? ?Thought Process  ?Thought Processes:Coherent; Goal Directed ? ?Descriptions of Associations:Intact ? ?Orientation:Full (Time, Place and Person) ? ?Thought Content:WDL ? ?History of Schizophrenia/Schizoaffective disorder:No data recorded ?Duration of Psychotic Symptoms:-- (Pt reports for a long time.) ? ?Hallucinations:No data recorded ?Ideas of Reference:None ? ?Suicidal Thoughts:No data recorded ?Homicidal Thoughts:No data recorded ? ?Sensorium  ?Memory:Immediate Good; Remote Good; Recent Good ? ?Judgment:Fair ? ?Insight:Fair ? ? ?Executive Functions  ?Concentration:Fair ? ?Attention Span:Fair ? ?Recall:Fair ? ?[place] ? ?Language:Good ? ? ?Psychomotor Activity  ?Psychomotor Activity:No data recorded ? ?Assets  ?Assets:Communication Skills; Physical Health; Financial Resources/Insurance ? ? ?Sleep  ?Sleep:No data recorded ? ? ?Physical Exam: ?Physical Exam ?ROS ?Blood pressure 126/87, pulse 93, temperature 98.5 ?F (36.9 ?C), temperature source Oral, resp. rate 20, SpO2 99 %. There is no height or weight on file to calculate BMI. ? ? ?Demographic Factors:  ?Male, Adolescent or young adult, Low socioeconomic status, and did not answer most questions. ? ?Loss Factors: ? ?Unable to obtain ?Historical Factors: ?Unable to obtain ? ?Risk Reduction Factors:   ?Unable to obtain ? ?Continued Clinical Symptoms:  ?Alcohol/Substance Abuse/Dependencies ? ?Cognitive Features That Contribute To Risk:  ?Polarized thinking   ? ?Suicide Risk:  ?Mild:  Suicidal ideation of limited frequency, intensity, duration, and specificity.  There are no identifiable plans, no associated intent, mild dysphoria and related symptoms, good self-control (both objective and subjective  assessment), few other risk factors, and identifiable protective factors, including available and accessible social support. ? ? ? ?Plan Of Care/Follow-up recommendations: Advised to take his medication-Aripiprazole 5 mg po daily.  Follow up with [facility] for Mental health care. ?Activity:  as tolerated ?Diet:  regular ? ?Disposition: Discharge ?Delfin Gant, NP-PMHNP-BC ?11/10/2021, 1:16 PM ? ?

## 2021-11-10 NOTE — ED Notes (Signed)
Clothing removed from room and placed in patient belonging cabinet with pt's sticker. Pt is in room and is with eyes closed resting.  ?

## 2021-11-10 NOTE — ED Notes (Signed)
Pt is very agitated and reporting he is being discharged.  Pt educated about d/c process being lengthy.  Pt began snapping fingers at this writer and stating we need to hurry up.    ?

## 2021-11-10 NOTE — ED Notes (Signed)
Pt's breakfast has arrived. Pt still sleeping, but writer woke pt up for his breakfast. Pt did not say anything, looked at breakfast and closed his eyes. Writer left breakfast on a nearby seat for pt due to no bedside table in room.  ?

## 2021-11-10 NOTE — ED Notes (Addendum)
Pt walked away from his stretcher and attempted to walk away through the same double doors, check previous note. Pt was become agitated with Probation officer and security when attempted to redirect them back to his stretcher. Pt chose a stepping stool to sit on and NT had their back towards the pt, pt came walking with stepping stool towards writer and other NT way. Pt had an aggressive attitude when walking, writer interpreted as pt would attack someone with the stepping stool due to the pt being verbally aggressive and stating "get the f*ck out of my face". Writer pulled NT away from pt, security got in between with Probation officer and pt. Pt stated "I was just gonna sit here with it". Security attempted to verbally calm pt down, but pt said in response "leave me alone you f*ggot". Writer did attempt to make pt calm down by reassuring him that a room would open up in the secure unit, pt simply would state "sh*t up, you f*cking liar. Get the f*ck out of my face". Writer is at a safe distance with pt, but Probation officer can see pt from distance and security is at nurses station.  ?

## 2021-11-10 NOTE — ED Notes (Addendum)
This nurse tried x2 to administer morning medication. Patient ignored nurse and would not answer her questions.  ?

## 2021-11-10 NOTE — ED Notes (Signed)
Pt requesting to leave, stating "I want to go, I'm all better now". While roaming around the hallways. Security on standby and RN notified. Security redirected pt back to his area ?

## 2021-11-13 DIAGNOSIS — Z87891 Personal history of nicotine dependence: Secondary | ICD-10-CM | POA: Insufficient documentation

## 2021-11-13 DIAGNOSIS — R030 Elevated blood-pressure reading, without diagnosis of hypertension: Secondary | ICD-10-CM | POA: Diagnosis not present

## 2021-11-13 DIAGNOSIS — Z046 Encounter for general psychiatric examination, requested by authority: Secondary | ICD-10-CM | POA: Insufficient documentation

## 2021-11-13 DIAGNOSIS — R945 Abnormal results of liver function studies: Secondary | ICD-10-CM | POA: Insufficient documentation

## 2021-11-14 ENCOUNTER — Encounter (HOSPITAL_COMMUNITY): Payer: Self-pay | Admitting: Emergency Medicine

## 2021-11-14 ENCOUNTER — Other Ambulatory Visit: Payer: Self-pay

## 2021-11-14 ENCOUNTER — Emergency Department (HOSPITAL_COMMUNITY)
Admission: EM | Admit: 2021-11-14 | Discharge: 2021-11-15 | Disposition: A | Discharge status: Home / Self Care | Payer: Medicaid Other | Attending: Emergency Medicine | Admitting: Emergency Medicine

## 2021-11-14 DIAGNOSIS — Z008 Encounter for other general examination: Secondary | ICD-10-CM

## 2021-11-14 DIAGNOSIS — R451 Restlessness and agitation: Secondary | ICD-10-CM

## 2021-11-14 LAB — COMPREHENSIVE METABOLIC PANEL
ALT: 86 U/L — ABNORMAL HIGH (ref 0–44)
AST: 52 U/L — ABNORMAL HIGH (ref 15–41)
Albumin: 3.7 g/dL (ref 3.5–5.0)
Alkaline Phosphatase: 47 U/L (ref 38–126)
Anion gap: 7 (ref 5–15)
BUN: 10 mg/dL (ref 6–20)
CO2: 23 mmol/L (ref 22–32)
Calcium: 8.7 mg/dL — ABNORMAL LOW (ref 8.9–10.3)
Chloride: 108 mmol/L (ref 98–111)
Creatinine, Ser: 0.67 mg/dL (ref 0.61–1.24)
GFR, Estimated: >60 mL/min (ref >60)
Glucose, Bld: 101 mg/dL — ABNORMAL HIGH (ref 70–99)
Potassium: 3.9 mmol/L (ref 3.5–5.1)
Sodium: 138 mmol/L (ref 135–145)
Total Bilirubin: 0.6 mg/dL (ref 0.3–1.2)
Total Protein: 6.4 g/dL — ABNORMAL LOW (ref 6.5–8.1)

## 2021-11-14 LAB — CBC
HCT: 40.9 % (ref 39.0–52.0)
Hemoglobin: 13.3 g/dL (ref 13.0–17.0)
MCH: 28.9 pg (ref 26.0–34.0)
MCHC: 32.5 g/dL (ref 30.0–36.0)
MCV: 88.7 fL (ref 80.0–100.0)
Platelets: 297 10*3/uL (ref 150–400)
RBC: 4.61 MIL/uL (ref 4.22–5.81)
RDW: 12.4 % (ref 11.5–15.5)
WBC: 8.9 10*3/uL (ref 4.0–10.5)
nRBC: 0 % (ref 0.0–0.2)

## 2021-11-14 LAB — RAPID URINE DRUG SCREEN, HOSP PERFORMED
Amphetamines: NOT DETECTED
Barbiturates: NOT DETECTED
Benzodiazepines: NOT DETECTED
Cocaine: NOT DETECTED
Opiates: NOT DETECTED
Tetrahydrocannabinol: NOT DETECTED

## 2021-11-14 LAB — SALICYLATE LEVEL: Salicylate Lvl: <7 mg/dL — ABNORMAL LOW (ref 7.0–30.0)

## 2021-11-14 LAB — ETHANOL: Alcohol, Ethyl (B): <10 mg/dL (ref <10)

## 2021-11-14 LAB — ACETAMINOPHEN LEVEL: Acetaminophen (Tylenol), Serum: <10 ug/mL — ABNORMAL LOW (ref 10–30)

## 2021-11-14 MED ORDER — TRAZODONE HCL 50 MG PO TABS: 50.0000 mg | ORAL_TABLET | Freq: Every day | ORAL | Status: DC

## 2021-11-14 MED ORDER — ARIPIPRAZOLE 5 MG PO TABS
5.0000 mg | ORAL_TABLET | Freq: Every day | ORAL | Status: DC
  Filled 2021-11-14: qty 1

## 2021-11-14 MED ORDER — ACETAMINOPHEN 325 MG PO TABS: 650.0000 mg | ORAL_TABLET | ORAL | Status: DC | PRN

## 2021-11-14 MED ORDER — LORAZEPAM 2 MG/ML IJ SOLN: 1.0000 mg | Freq: Three times a day (TID) | INTRAMUSCULAR | Status: DC | PRN

## 2021-11-14 MED ORDER — NICOTINE 21 MG/24HR TD PT24: 21.0000 mg | MEDICATED_PATCH | Freq: Every day | TRANSDERMAL | Status: DC | PRN

## 2021-11-14 MED ORDER — HALOPERIDOL 5 MG PO TABS: 5.0000 mg | ORAL_TABLET | Freq: Three times a day (TID) | ORAL | Status: DC | PRN

## 2021-11-14 MED ORDER — DIPHENHYDRAMINE HCL 50 MG/ML IJ SOLN: 50.0000 mg | Freq: Once | INTRAMUSCULAR | Status: DC

## 2021-11-14 NOTE — ED Notes (Signed)
Report to [place], Therapist, sports. Patient moved to bed 3. Patient is voluntary and has no sitter at this time. ?

## 2021-11-14 NOTE — ED Notes (Signed)
Patient to bed 49, walked in to room and closed door. Patient ignored this nurse and refused to respond to questioning. Awaiting psychiatry's disposition ?

## 2021-11-14 NOTE — ED Notes (Signed)
Patient to be held for inpatient placement. ?

## 2021-11-14 NOTE — Progress Notes (Signed)
Per Justin Chaney, patient meets criteria for inpatient treatment. There are no available or appropriate beds at [facility] today. CSW faxed referrals to the following facilities for review: ? ?[facility]  Pending - No Request Sent N/A [address], [place] [address] [phone] --  ?[place]  Pending - No Request Sent N/A [address], [place] [address] [phone] --  ?[place]  Pending - No Request Sent N/A [address] [place] [address] [phone] --  ?[facility]  Pending - No Request Sent N/A [address] [place] [address] [phone] --  ?[place] [place] [address] [phone] --  ?[place] [place]  Pending - No Request Sent N/A [address] [place] [address] [phone] --  ?[facility]  Pending - No Request Sent N/A [address] [place], [place] [address] [phone] --  ?[facility]  Pending - No Request Sent N/A [address] [place] [address] [phone] --  ?[facility]  Pending - No Request Sent N/A [address] [place] [address] [phone] --  ?[facility]  Pending - No Request Sent N/A [address] [place] [address] [phone] --  ?[place]  Pending - No Request Sent N/A [address], [place] [address] [phone] --  ?[facility]  Pending - No Request Sent N/A [address] [place] [address] [phone] --  ?[place]  Pending - No Request Sent N/A [address] [place] [address] [phone] --   ?[facility]  Pending - No Request Sent N/A [address] [place] [address] [phone] --  ?[facility]  Pending - No Request Sent N/A [place], [place] [address] [phone] --  ? ?TTS will continue to seek bed placement. ? ?Justin Chaney, MSW, LCSW-A, LCAS-A ?Phone: [phone] ?Disposition/TOC ? ?

## 2021-11-14 NOTE — ED Triage Notes (Addendum)
Patient here wanting inpatient for mental health.  He states that he has not been taking medications.  Denies SI/HI.  Patient states that he has schizophrenia.  Patient uncooperative in triage. ?

## 2021-11-14 NOTE — ED Notes (Signed)
Pt placed in room. Pt closed door.  This nurse opened door and explained the door has to remain open.  Pt spit and cursed at this nurse. I explained if he does not cooperate and continues to spit we will call security and medicate him. The pt said "fuck you bitch, get the hell out of my face."  Pt then proceeded to lay down. ?

## 2021-11-14 NOTE — ED Notes (Signed)
Patient came out asking for another pair of socks. I had given him another pair ~ 2 hours ago. Patient stated they were dirty so he threw them away. I stated we could not jut keep giving him socks to throw away and he started cursing at me and asking the other people working if he could have another pair of socks. They refused and patient directed to go back into his room.  ?

## 2021-11-14 NOTE — ED Notes (Signed)
Ford wanted to speak with the pt but he has not been co-operative  someoone will attempt again today ?

## 2021-11-14 NOTE — BH Assessment (Signed)
Per RN, Pt is hostile and not willing to participate in tele-assessment at this time. ?

## 2021-11-14 NOTE — BH Assessment (Signed)
TTS spoke to [facility], to put pt in a private room to complete TTS assessment.  Clinician to call the cart in ten minutes. ?

## 2021-11-14 NOTE — ED Notes (Signed)
Patient walking around unit, talking self and laughing. ?

## 2021-11-14 NOTE — ED Notes (Signed)
Patient is currently pacing the unit, going into empty rooms. Patient then requesting to take a shower. Since dinner should be here son, I asked patient to wait to bath after he eats.  ?

## 2021-11-14 NOTE — ED Notes (Signed)
Dinner given top patient ?

## 2021-11-14 NOTE — ED Notes (Signed)
Clothes inventoried most of 2 bags damp ?? From the rain  pt un-co-operative  ?

## 2021-11-14 NOTE — ED Notes (Signed)
3 bags  one a small back pack placed in locker number 3 ?

## 2021-11-14 NOTE — ED Notes (Signed)
Lunch delivered. 

## 2021-11-14 NOTE — ED Provider Notes (Signed)
?[facility] ?Provider Note ? ? ?CSN: [id number] ?Arrival date & time: 11/13/21  2352 ? ?  ? ?History ? ?Chief Complaint  ?Patient presents with  ? Medical Clearance  ? ? ?Justin Chaney is a 26 y.o. male with a history of tobacco use, schizophrenia, bipolar disorder, polysubstance abuse (cannabis, amphetamines), ADHD, and is status post VP shunt who presents to the emergency department requesting behavioral health evaluation.  He states he does not wish to talk to me, utilizes multiple swear words, he denies suicidal ideations, does not comment on homicidal ideations or hallucinations.  He denies pain or fevers. ? ?HPI ? ?  ? ?Home Medications ?Prior to Admission medications   ?Medication Sig Start Date End Date Taking? Authorizing Provider  ?ARIPiprazole (ABILIFY) 5 MG tablet Take 1 tablet (5 mg total) by mouth daily. ?Patient not taking: Reported on 11/09/2021 12/17/20   Ival Bible, MD  ?   ? ?Allergies    ?Amoxicillin and Penicillin g   ? ?Review of Systems   ?Review of Systems  ?Constitutional:  Negative for fever.  ?Cardiovascular:  Negative for chest pain.  ?Gastrointestinal:  Negative for abdominal pain.  ?Psychiatric/Behavioral:  Positive for behavioral problems. Negative for suicidal ideas.   ?All other systems reviewed and are negative. ? ?Physical Exam ?Updated Vital Signs ?BP (!) 133/96 (BP Location: Right Arm)   Pulse 93   Temp 99.1 ?F (37.3 ?C)   Resp 17   SpO2 100%  ?Physical Exam ?Vitals and nursing note reviewed.  ?Constitutional:   ?   General: He is not in acute distress. ?   Appearance: He is well-developed.  ?HENT:  ?   Head: Normocephalic and atraumatic.  ?Eyes:  ?   General:     ?   Right eye: No discharge.     ?   Left eye: No discharge.  ?   Conjunctiva/sclera: Conjunctivae normal.  ?Cardiovascular:  ?   Rate and Rhythm: Normal rate.  ?Pulmonary:  ?   Effort: Pulmonary effort is normal. No respiratory distress.  ?Skin: ?   General: Skin is  warm and dry.  ?Neurological:  ?   Mental Status: He is alert.  ?   Comments: Moving all extremities.  Clear speech.   ?Psychiatric:     ?   Mood and Affect: Affect is angry.     ?   Behavior: Behavior is uncooperative and withdrawn.  ? ? ?ED Results / Procedures / Treatments   ?Labs ?(all labs ordered are listed, but only abnormal results are displayed) ?Labs Reviewed  ?COMPREHENSIVE METABOLIC PANEL - Abnormal; Notable for the following components:  ?    Result Value  ? Glucose, Bld 101 (*)   ? Calcium 8.7 (*)   ? Total Protein 6.4 (*)   ? AST 52 (*)   ? ALT 86 (*)   ? All other components within normal limits  ?ACETAMINOPHEN LEVEL - Abnormal; Notable for the following components:  ? Acetaminophen (Tylenol), Serum <10 (*)   ? All other components within normal limits  ?SALICYLATE LEVEL - Abnormal; Notable for the following components:  ? Salicylate Lvl <0.8 (*)   ? All other components within normal limits  ?ETHANOL  ?CBC  ?RAPID URINE DRUG SCREEN, HOSP PERFORMED  ? ? ?EKG ?None ? ?Radiology ?No results found. ? ?Procedures ?Procedures  ? ? ?Medications Ordered in ED ?Medications - No data to display ? ?ED Course/ Medical Decision Making/ A&P ?  ?                        ?  Medical Decision Making ?Amount and/or Complexity of Data Reviewed ?Labs: ordered. ? ?Risk ?OTC drugs. ? ? ?Patient presents to the ED for behavioral health assessment.  ?He does not wish to further elaborate. He becomes quite agitated, angry, and aggressive on my evaluation.  Nontoxic, vitals with mildly elevated diastolic blood pressure, otherwise unremarkable. ? ?Additional history obtained:  ?Additional history obtained from chart review & nursing note review.  ? ?Lab Tests:  ?I have viewed & interpreted screening labs including CBC, CMP, acetaminophen/salicylate/ethanol level, UDS : LFTs are elevated, have been previously.  Otherwise fairly unremarkable. ? ?ED Course:  ?Patient is medically cleared. Consult placed to TTS. Disposition per [facility].   ? ?The patient has been placed in psychiatric observation due to the need to provide a safe environment for the patient while obtaining psychiatric consultation and evaluation, as well as ongoing medical and medication management to treat the patient's condition.  The patient has not been placed under full IVC at this time. ? ?Portions of this note were generated with Lobbyist. Dictation errors may occur despite best attempts at proofreading. ? ? ?Final Clinical Impression(s) / ED Diagnoses ?Final diagnoses:  ?Evaluation by psychiatric service required  ? ? ?Rx / DC Orders ?ED Discharge Orders   ? ? None  ? ?  ? ? ?  ?Amaryllis Dyke, [place] ?11/14/21 2426 ? ?  ?Margette Fast, MD ?11/14/21 2305 ? ?

## 2021-11-14 NOTE — ED Notes (Signed)
Patient refusing to have vital signs taken, or EKG performed. ?

## 2021-11-14 NOTE — ED Notes (Signed)
Patient in his room, intermittently yelling out "shut up" even though no one else is in his room or talking to him at the time.  ?

## 2021-11-14 NOTE — ED Notes (Signed)
Pt awake and ambulatory to restroom independently. Pt requesting more food, states he ate all his breakfast but it wasn't enough food. Pt provided with bagged lunch and crackers.  ? ?Pt is calm and cooperative with staff.  ?

## 2021-11-14 NOTE — ED Notes (Signed)
Pt walked to private room for TTS consult. Pt very short in his responses and walked out before completing assessment. ?Pt then walked back to bed in hallway and ignored RN asking him if he needed anything   ?

## 2021-11-14 NOTE — BH Assessment (Signed)
Comprehensive Clinical Assessment (CCA) Note ? ?11/14/2021 ?Justin Chaney ?[id number] ? ?Chief Complaint:  ?Chief Complaint  ?Patient presents with  ? Medical Clearance  ? Agitation  ? ?Visit Diagnosis:  ?Per Chart: Schizophrenia, Bipolar ? ?[place] [facility] from 11/14/2021 in [facility] from 11/09/2021 in [facility] OP Visit from 01/14/2021 in [place]  ?C-SSRS RISK CATEGORY No Risk No Risk No Risk  ? ?  ? ?The patient demonstrates the following risk factors for suicide: Chronic risk factors for suicide include: psychiatric disorder of schizophrenia, bipolar, substance use disorder, and previous suicide attempts by overdose . Acute risk factors for suicide include: social withdrawal/isolation. Protective factors for this patient include: positive social support, positive therapeutic relationship, coping skills, and hope for the future. Considering these factors, the overall suicide risk at this point appears to be no risk. Patient is not appropriate for outpatient follow up. ? ?Disposition ?Merlyn Lot NP, patient meets inpatient criteria, SW  contacted and bed availability under review.  Disposition discussed with Armed forces training and education officer.  RN to discuss disposition with EDP.  ? ?Justin Chaney is a 26 year old  who presents voluntarily to [facility] and unaccompanied.  Pt has a history of bipolar and schizophrenia; reports he has been feeling withdrawn.  Pt denies SI, HI or AVH. When asked reasons to the ED, patient states "it's time to come in for inpatient". Pt presents ' laughing ' throughout the assessment.  Pt presents withdrawn, angry, agitation, racing thoughts, irritable, reckless and over confident.  Pt  reports he has not been sleeping, would not elaborate on how much or when he is sleeping.  Pt reports decreased in appetite.  Pt says he has not been drinking alcohol or using any other  substance use. ? ?Pt  unable to identify his primary stressor.  Pt refused to identify a support person or guardian.  Pt reports he live alone, refused to identify a place or where in the community.  Pt refused to reports family history of mental illness or family history of substance used.  Pt refused to report any history of abuse or trauma.  Pt refused to elaborate on any current legal problems.  Pt refused to answer questions as it relates to guns and weapons in his possession. ? ?Pt refused to answer questions as it related to receiving weekly outpatient therapy. Pt reports he is not taking prescribed medication.  Pt has a previous inpatient psychiatric hospitalization in May 2022. ? ?Pt is dressed in scrubs, alert, oriented x 3 with flight of ideas and restless motor behavior.  Eye contact is normal.  Pt mood is irritable and dysphoric.  Pt affect is negative, and restricted.   Pt process is relevant.  Pt's insight is fair and judgment is fair.  There is no indication Pt is currently responding to internal stimuli or experiencing delusional thought content.  Pt was guarded, and  agitated; also pt  turned off the camera while assessment was in process. ? ?CCA Screening, Triage and Referral (STR) ? ?Patient Reported Information ?How did you hear about [name]? Self ? ?What Is the Reason for Your Visit/Call Today? Agitatation ? ?How Long Has This Been Causing You Problems? 1 wk - 1 month ? ?What Do You Feel Would Help You the Most Today? Treatment for Depression or other mood problem ? ? ?Have You Recently Had Any Thoughts About Hurting Yourself? No ? ?Are You Planning to Commit Suicide/Harm Yourself At  This time? No ? ? ?Have you Recently Had Thoughts About [place]? No ? ?Are You Planning to Harm Someone at This Time? No ? ?Explanation: No data recorded ? ?Have You Used Any Alcohol or Drugs in the Past 24 Hours? No ? ?How Long Ago Did You Use Drugs or Alcohol? No data recorded ?What Did You Use and How Much? No data  recorded ? ?Do You Currently Have a Therapist/Psychiatrist? -- (UTA) ? ?Name of Therapist/Psychiatrist: No data recorded ? ?Have You Been Recently Discharged From Any Office Practice or Programs? No ? ?Explanation of Discharge From Practice/Program: No data recorded ? ?  ?CCA Screening Triage Referral Assessment ?Type of Contact: Tele-Assessment ? ?Telemedicine Service Delivery: Telemedicine service delivery: This service was provided via telemedicine using a 2-way, interactive audio and video technology ? ?Is this Initial or Reassessment? Initial Assessment ? ?Date Telepsych consult ordered in [facility]:  11/14/21 ? ?Time Telepsych consult ordered in [facility]:  No data recorded ?Location of Assessment: [facility] ED ? ?Provider Location: [facility] Assessment Services ? ? ?Collateral Involvement: Pt declined for clinician to contact collaterals. ? ? ?Does Patient Have a Stage manager Guardian? No data recorded ?Name and Contact of Legal Guardian: No data recorded ?If Minor and Not Living with Parent(s), Who has Custody? n/a ? ?Is CPS involved or ever been involved? Never ? ?Is APS involved or ever been involved? -- (UTA) ? ? ?Patient Determined To Be At Risk for Harm To Self or Others Based on Review of Patient Reported Information or Presenting Complaint? No ? ?Method: No data recorded ?Availability of Means: No data recorded ?Intent: No data recorded ?Notification Required: No data recorded ?Additional Information for Danger to Others Potential: No data recorded ?Additional Comments for Danger to Others Potential: No data recorded ?Are There Guns or Other Weapons in [place]? No data recorded ?Types of Guns/Weapons: No data recorded ?Are These Weapons Safely Secured?                            No data recorded ?Who Could Verify You Are Able To Have These Secured: No data recorded ?Do You Have any Outstanding Charges, Pending Court Dates, Parole/Probation? No data recorded ?Contacted To Inform of Risk of Harm To Self or Others:  Other: Comment (No need to notifiy.) ? ? ? ?Does Patient Present under Involuntary Commitment? No ? ?IVC Papers Initial File Date: No data recorded ? ?[place] of Residence: Kathleen Argue ? ? ?Patient Currently Receiving the Following Services: Not Receiving Services ? ? ?Determination of Need: Urgent (48 hours) ? ? ?Options For Referral: [facility] ? ? ? ? ?CCA Biopsychosocial ?Patient Reported Schizophrenia/Schizoaffective Diagnosis in Past: Yes ? ? ?Strengths: UTA ? ? ?Mental Health Symptoms ?Depression:   ?Fatigue; Irritability ?  ?Duration of Depressive symptoms:  ?Duration of Depressive Symptoms: Less than two weeks ?  ?Mania:   ?Irritability; Recklessness; Change in energy/activity; Overconfidence ?  ?Anxiety:    ?Fatigue; Restlessness; Irritability ?  ?Psychosis:   ?Other negative symptoms (over confidence, continous laughing throughout assessment) ?  ?Duration of Psychotic symptoms:  ?Duration of Psychotic Symptoms: Less than six months ?  ?Trauma:   ?None ?  ?Obsessions:   ?None ?  ?Compulsions:   ?None ?  ?Inattention:   ?None ?  ?Hyperactivity/Impulsivity:   ?N/A ?  ?Oppositional/Defiant Behaviors:   ?Easily annoyed; Argumentative; Intentionally annoying ?  ?Emotional Irregularity:   ?Intense/inappropriate anger ?  ?Other Mood/Personality  Symptoms:   ?Pt is very irritable. ?  ? ?Mental Status Exam ?Appearance and self-care  ?Stature:   ?Average ?  ?Weight:   ?Average weight ?  ?Clothing:   ?-- (Pt in scrubs.) ?  ?Grooming:   ?Neglected ?  ?Cosmetic use:   ?None ?  ?Posture/gait:   ?Stooped ?  ?Motor activity:   ?Agitated (Pt scratching his arms.) ?  ?Sensorium  ?Attention:   ?Inattentive ?  ?Concentration:   ?Variable; Preoccupied ?  ?Orientation:   ?Object; Person; Place ?  ?Recall/memory:   ?Normal ?  ?Affect and Mood  ?Affect:   ?Negative; Restricted (congruent with mood.) ?  ?Mood:   ?Irritable; Dysphoric ?  ?Relating  ?Eye contact:   ?Normal ?  ?Facial expression:   ?Angry; Sad (Irritable.) ?   ?Attitude toward examiner:   ?Suspicious; Hostile; Guarded; Irritable ?  ?Thought and Language  ?Speech flow:  ?Flight of Ideas ?  ?Thought content:   ?Suspicious ?  ?Preoccupation:   ?None ?  ?Hallucinations:   ?Other

## 2021-11-15 ENCOUNTER — Encounter (HOSPITAL_COMMUNITY): Payer: Self-pay

## 2021-11-15 NOTE — ED Notes (Signed)
Pt continues to come to his doorway and attempt to interact w/ this staff member while he mumbles and then laughs and smirks, because I asked him to please repeat what he said because I can't hear him.  ?

## 2021-11-15 NOTE — ED Notes (Signed)
MD at bedside to speak with patient. Patient refusing to speak with him. MD states patient is going to be discharged. Patient given his belonging and escorted off the unit. Patient is cursing the entire time. Left prior to getting discharge paperwork. ?

## 2021-11-15 NOTE — ED Notes (Signed)
Pt has a chair behind the door. Security is going to remove the chair from behind the door and the door will remain wide open. Pt has been educated and informed of this and continues to be aggressive. ?

## 2021-11-15 NOTE — Discharge Instructions (Signed)
Return for any problem.  ?

## 2021-11-15 NOTE — ED Notes (Addendum)
Pt continuing to slam door and shut blinds, pt notified that he has to keep the blinds to his room open for safety. Pt responded "fuck you, get the fuck out of my face" and spit at nurse. Security called for support and continued boundary establishment.  ?

## 2021-11-15 NOTE — ED Notes (Signed)
Pt escorted to purple by security. Pt continues to close the door despite educating pt that he has to keep it open. Security remains at bedside. ?

## 2021-11-15 NOTE — ED Notes (Signed)
Pt closed door and was told that he needs to keep it open. Pt closed the door again and was reeducated to keep it open. He then told security to "Suck my dick". Pt verbally aggressive. Notified Messick MD. ?

## 2021-11-15 NOTE — ED Notes (Signed)
Pt still continues to close the blind in his room every so often, each time he closes them, a staff member opens them back up. ?

## 2021-11-15 NOTE — ED Provider Notes (Signed)
Patient is agitated and combative with staff. ? ?Patient is unwilling to stay in the ED.  He denies suicidality. ? ?He desires discharge.  Patient is encouraged to remain calm so that we could complete work-up and evaluation. ? ?Patient declines medications to help him calm down. ? ?Patient without indication for IVC. ? ?Patient repeatedly confirms that this examiner that he has no intention to harm himself. ? ?Patient will be discharged.  [employer] / Security involved given patient's hostility toward staff. ?  ?Valarie Merino, MD ?11/15/21 1125 ? ?

## 2021-11-15 NOTE — ED Notes (Signed)
Pt closed door again. Security opened back up. Pt then closed the door again. Security opened the door again and pt was Materials engineer and security Pt told the door has to stay open. Pt continues to be hostile and abusive to staff. Notified CN and EDP. ?

## 2021-11-15 NOTE — ED Notes (Signed)
Pt refused his ability. Reminded pt that he came in here and reported that he wanted help and part of that is to take his abilify. Pt then stated, "Bitch get out of my face, get the fuck out of here". Informed pt that we do not tolerate verbal abuse. Pt continued to cuss at this RN and shut his door. Security opened pt's door and pt then tried to shut it again. Security then opened the door again. Pt then spit in the floor. Notified MD. ?

## 2021-11-15 NOTE — ED Notes (Signed)
Pt continues to refuse to let any staff check his vital signs. During any attempts he curses at staff, spits at staff and is completely uncooperative. RN aware.  ?

## 2021-11-15 NOTE — ED Notes (Signed)
Pt in bathroom continuously flushing toilet. Security checked on pt and pt is sitting on the toilet while flushing the toilet. Will continue to monitor situation. ?

## 2021-11-15 NOTE — ED Notes (Signed)
Pt ambulated back to room

## 2021-11-15 NOTE — ED Notes (Signed)
Pt just stepped out to the nurses station asking for paper, this tech asked him if he needed paper to write on and he said "duh, what the fuck else kind of paper would I want?" I told the pt he could not have any paper at this time and he responded then with "well fuck you then cracker bitch!"  The pt went back into his room and slammed the door, blinds opened at this time and pt continues to peek through them. RN notified. ?

## 2021-11-15 NOTE — ED Notes (Signed)
Pt continuing to be uncooperative and closing his door despite multiple attempts to re-educate pt that the door needs to stay opened.  ?

## 2021-11-15 NOTE — ED Notes (Signed)
Pt still continuing to close the blind in the room. RN opened them back, pt closed them back, this tech opened them back. He jumped on to the counter as I was calling security and then come out of the room asking for soap. Security and [employer] outside of pt's room.  ?

## 2021-11-15 NOTE — ED Provider Notes (Signed)
Emergency Medicine Observation Re-evaluation Note ? ?Justin Chaney is a 26 y.o. male, seen on rounds today at 0700.  Pt initially presented to the ED for complaints of Medical Clearance and Agitation ?Currently, the patient is resting comfortably. ? ?Physical Exam  ?BP (!) 133/96 (BP Location: Right Arm)   Pulse 93   Temp 99.1 ?F (37.3 ?C)   Resp 17   SpO2 100%  ?Physical Exam ?General: NAD ? ?ED Course / MDM  ?EKG:  ? ?I have reviewed the labs performed to date as well as medications administered while in observation.  Recent changes in the last 24 hours include continued moderate agitation and uncooperative behaviours. ? ?Plan  ?Current plan is for placement. ? Justin Chaney is not under involuntary commitment. ? ? ?  ?Justin Merino, MD ?11/15/21 [phone] ? ?

## 2021-11-15 NOTE — ED Notes (Signed)
Pt came out of room and stated, I'm going to the bathroom. He started to walk towards 52 and staff redirected pt to the bathroom location. Upon ambulating to bathroom, pt then peeked in on another pt in their room and proceeded to the bathroom. Pt w/ suspicious behaviors. Pt laughing as he went back to his room. Pt attempted to close his room door and this staff member reminded pt that the door has to stay open. Pt cracked the door open. ?

## 2021-11-15 NOTE — ED Notes (Signed)
Breakfast order placed ?

## 2022-07-20 ENCOUNTER — Emergency Department (HOSPITAL_COMMUNITY): Admission: EM | Admit: 2022-07-20 | Discharge: 2022-07-20 | Discharge status: Against Medical Advice | Payer: Self-pay

## 2022-07-20 NOTE — ED Notes (Signed)
Unable to find in lobby for triage.

## 2022-08-22 ENCOUNTER — Emergency Department (HOSPITAL_COMMUNITY): Payer: Medicaid Other

## 2022-08-22 ENCOUNTER — Encounter (HOSPITAL_COMMUNITY): Payer: Self-pay

## 2022-08-22 ENCOUNTER — Other Ambulatory Visit: Payer: Self-pay

## 2022-08-22 ENCOUNTER — Emergency Department (HOSPITAL_COMMUNITY)
Admission: EM | Admit: 2022-08-22 | Discharge: 2022-08-22 | Disposition: A | Discharge status: Home / Self Care | Payer: Medicaid Other | Attending: Emergency Medicine | Admitting: Emergency Medicine

## 2022-08-22 DIAGNOSIS — R440 Auditory hallucinations: Secondary | ICD-10-CM | POA: Insufficient documentation

## 2022-08-22 DIAGNOSIS — J45909 Unspecified asthma, uncomplicated: Secondary | ICD-10-CM | POA: Insufficient documentation

## 2022-08-22 DIAGNOSIS — E8729 Other acidosis: Secondary | ICD-10-CM | POA: Diagnosis not present

## 2022-08-22 DIAGNOSIS — E876 Hypokalemia: Secondary | ICD-10-CM | POA: Diagnosis not present

## 2022-08-22 LAB — CBC WITH DIFFERENTIAL/PLATELET
Abs Immature Granulocytes: 0.04 10*3/uL (ref 0.00–0.07)
Basophils Absolute: 0 10*3/uL (ref 0.0–0.1)
Basophils Relative: 0 %
Eosinophils Absolute: 0.2 10*3/uL (ref 0.0–0.5)
Eosinophils Relative: 2 %
HCT: 45.6 % (ref 39.0–52.0)
Hemoglobin: 15.4 g/dL (ref 13.0–17.0)
Immature Granulocytes: 0 %
Lymphocytes Relative: 22 %
Lymphs Abs: 2.1 10*3/uL (ref 0.7–4.0)
MCH: 28.9 pg (ref 26.0–34.0)
MCHC: 33.8 g/dL (ref 30.0–36.0)
MCV: 85.6 fL (ref 80.0–100.0)
Monocytes Absolute: 0.9 10*3/uL (ref 0.1–1.0)
Monocytes Relative: 9 %
Neutro Abs: 6.2 10*3/uL (ref 1.7–7.7)
Neutrophils Relative %: 67 %
Platelets: 196 10*3/uL (ref 150–400)
RBC: 5.33 MIL/uL (ref 4.22–5.81)
RDW: 11.9 % (ref 11.5–15.5)
WBC: 9.4 10*3/uL (ref 4.0–10.5)
nRBC: 0 % (ref 0.0–0.2)

## 2022-08-22 LAB — RAPID URINE DRUG SCREEN, HOSP PERFORMED
Amphetamines: NOT DETECTED
Barbiturates: NOT DETECTED
Benzodiazepines: NOT DETECTED
Cocaine: NOT DETECTED
Opiates: NOT DETECTED
Tetrahydrocannabinol: NOT DETECTED

## 2022-08-22 LAB — COMPREHENSIVE METABOLIC PANEL
ALT: 22 U/L (ref 0–44)
AST: 26 U/L (ref 15–41)
Albumin: 4.3 g/dL (ref 3.5–5.0)
Alkaline Phosphatase: 57 U/L (ref 38–126)
Anion gap: 9 (ref 5–15)
BUN: 14 mg/dL (ref 6–20)
CO2: 21 mmol/L — ABNORMAL LOW (ref 22–32)
Calcium: 9.2 mg/dL (ref 8.9–10.3)
Chloride: 106 mmol/L (ref 98–111)
Creatinine, Ser: 0.58 mg/dL — ABNORMAL LOW (ref 0.61–1.24)
GFR, Estimated: >60 mL/min (ref >60)
Glucose, Bld: 149 mg/dL — ABNORMAL HIGH (ref 70–99)
Potassium: 3.4 mmol/L — ABNORMAL LOW (ref 3.5–5.1)
Sodium: 136 mmol/L (ref 135–145)
Total Bilirubin: 0.9 mg/dL (ref 0.3–1.2)
Total Protein: 7.3 g/dL (ref 6.5–8.1)

## 2022-08-22 LAB — ETHANOL: Alcohol, Ethyl (B): <10 mg/dL (ref <10)

## 2022-08-22 LAB — SALICYLATE LEVEL: Salicylate Lvl: <7 mg/dL — ABNORMAL LOW (ref 7.0–30.0)

## 2022-08-22 LAB — ACETAMINOPHEN LEVEL: Acetaminophen (Tylenol), Serum: <10 ug/mL — ABNORMAL LOW (ref 10–30)

## 2022-08-22 NOTE — ED Notes (Signed)
Pt states he wants to leave. EDP made aware.

## 2022-08-22 NOTE — ED Notes (Signed)
Pt declined CT

## 2022-08-22 NOTE — ED Notes (Signed)
Provided pt bag lunch and beverage  Informed pt if he is going to receive IP tx he will be a able to get a hot tray. Pt verbalized understanding

## 2022-08-22 NOTE — Discharge Instructions (Signed)
Please go to [facility] behavioral center for further evaluation and possible refill of your medications.  Return to the ER for any new or worsening symptoms.

## 2022-08-22 NOTE — ED Notes (Signed)
Provided pt warm blanket 

## 2022-08-22 NOTE — ED Provider Triage Note (Signed)
Emergency Medicine Provider Triage Evaluation Note  Justin Chaney , a 27 y.o. male  was evaluated in triage.  Pt complains of auditory hallucinations.  States he has been off of his medication for about a year now.  He denies any suicidal or homicidal ideation..  Review of Systems  Positive: hallucinations Negative: SI/HI  Physical Exam  BP (!) 134/102   Pulse 92   Temp 98.7 F (37.1 C) (Oral)   Resp 16   SpO2 100%   Gen:   Awake, no distress   Resp:  Normal effort  MSK:   Moves extremities without difficulty  Other:  Calm/cooperative during exam, denies SI/HI  Medical Decision Making  Medically screening exam initiated at 3:26 AM.  Appropriate orders placed.  Justin Chaney was informed that the remainder of the evaluation will be completed by another provider, this initial triage assessment does not replace that evaluation, and the importance of remaining in the ED until their evaluation is complete.  Hallucinations.  Denies SI/HI.  Labs ordered.   Larene Pickett, PA-C 08/22/22 [phone]

## 2022-08-22 NOTE — ED Triage Notes (Signed)
Pt reports he has been off his meds for over a year. He reports hearing things. Denies SI/HI

## 2022-08-22 NOTE — ED Notes (Signed)
Pt states he is hearing spirits. Pt would like to either seek IP tx or refill on his medications.   RN informed pt if IP is decided he will be changed in hospital attire and belongings placed in locker. Pt verbalized understanding.

## 2022-08-22 NOTE — ED Provider Notes (Addendum)
[facility] Provider Note   CSN: [id number] Arrival date & time: 08/22/22  [id number]     History  Chief Complaint  Patient presents with   Hallucinations    Takeru Titzer is a 27 y.o. male.      27 year old male with a history of substance abuse, schizoaffective disorder/schizophrenia, intermittent explosive disorder, ADHD, malingering, subdural hygroma, ventriculoperitoneal shunt IV drug use, asthma presents to the ER complaining of auditory hallucinations and wanting to be back on his medicines.  Denies any SI or HI.  Denies any alcohol or drug use.  Speech is rapid and pressured.    Home Medications Prior to Admission medications   Medication Sig Start Date End Date Taking? Authorizing Provider  ARIPiprazole (ABILIFY) 5 MG tablet Take 1 tablet (5 mg total) by mouth daily. Patient not taking: Reported on 11/09/2021 12/17/20   Ival Bible, MD      Allergies    Amoxicillin and Penicillin g    Review of Systems   Review of Systems Ten systems reviewed and are negative for acute change, except as noted in the HPI.   Physical Exam Updated Vital Signs BP (!) 136/118 (BP Location: Left Arm)   Pulse 90   Temp (!) 97.3 F (36.3 C)   Resp 15   SpO2 99%  Physical Exam Vitals and nursing note reviewed.  Constitutional:      General: He is not in acute distress.    Appearance: He is well-developed.  HENT:     Head: Normocephalic and atraumatic.  Eyes:     Conjunctiva/sclera: Conjunctivae normal.  Cardiovascular:     Rate and Rhythm: Normal rate and regular rhythm.     Heart sounds: No murmur heard. Pulmonary:     Effort: Pulmonary effort is normal. No respiratory distress.     Breath sounds: Normal breath sounds.  Abdominal:     Palpations: Abdomen is soft.     Tenderness: There is no abdominal tenderness.  Musculoskeletal:        General: No swelling.     Cervical back: Neck supple.  Skin:    General: Skin is  warm and dry.     Capillary Refill: Capillary refill takes less than 2 seconds.  Neurological:     Mental Status: He is alert and oriented to person, place, and time.     Cranial Nerves: No cranial nerve deficit.     Sensory: No sensory deficit.     Motor: No weakness.  Psychiatric:        Mood and Affect: Mood normal.     Comments:  Speech is rapid and pressured, slightly slurred      ED Results / Procedures / Treatments   Labs (all labs ordered are listed, but only abnormal results are displayed) Labs Reviewed  COMPREHENSIVE METABOLIC PANEL - Abnormal; Notable for the following components:      Result Value   Potassium 3.4 (*)    CO2 21 (*)    Glucose, Bld 149 (*)    Creatinine, Ser 0.58 (*)    All other components within normal limits  SALICYLATE LEVEL - Abnormal; Notable for the following components:   Salicylate Lvl <1.3 (*)    All other components within normal limits  ACETAMINOPHEN LEVEL - Abnormal; Notable for the following components:   Acetaminophen (Tylenol), Serum <10 (*)    All other components within normal limits  CBC WITH DIFFERENTIAL/PLATELET  ETHANOL  RAPID URINE DRUG SCREEN, HOSP  PERFORMED    EKG None  Radiology No results found.  Procedures Procedures    Medications Ordered in ED Medications - No data to display  ED Course/ Medical Decision Making/ A&P                             Medical Decision Making  Patient presents to the ER with complaints of auditory hallucinations, requiring medical current clearance for evaluation by psychiatry.  On presentation, the patient is calm, speech is rapid and pressured, slightly slurred.  Vitals personally reviewed by me, overall reassuring.  I personally reviewed his lab work, which did not show any significant abnormalities.  CBC without leukocytosis, normal hemoglobin.  CMP with mild hypokalemia but no other significant electrolyte abnormalities. Mild acidosis, suspect 2/2 dehydration. abnormalities,  normal BUN/creatinine.  Negative acetaminophen, salicylate, ethanol.  UDS negative. CT head ordered given slurred speech but patient refused this. He has no other focal deficit, I have lower suspicion for stroke though he does have a history of CSF shunt for prior subdural hematoma. He denies and recent trauma. His speech may be baseline/2/2 mental condition.  He  has been medically cleared for further evaluation by TTS.  Dispo according to the recommendation.  9:38 AM: I was approached by RN that the patient requesting to be discharged.  I do not think he poses an acute threat to himself or others, denies any SI or HI. No indication for IVC. I did give him referral to [facility]  for refill of his medications.  He reports being off his Abilify for more than a year so I will forego refilling it at this time.  Final Clinical Impression(s) / ED Diagnoses Final diagnoses:  Auditory hallucination    Rx / DC Orders ED Discharge Orders     None          Garald Balding, PA-C 08/22/22 [id number]    Malvin Johns, MD 09/02/22 1500

## 2022-08-22 NOTE — ED Notes (Signed)
Pt provided [place] information to follow up with care. Pt verbalized understanding

## 2022-09-01 ENCOUNTER — Emergency Department (HOSPITAL_COMMUNITY)
Admission: EM | Admit: 2022-09-01 | Discharge: 2022-09-02 | Disposition: A | Discharge status: Home / Self Care | Payer: Medicaid Other | Attending: Emergency Medicine | Admitting: Emergency Medicine

## 2022-09-01 DIAGNOSIS — Z0389 Encounter for observation for other suspected diseases and conditions ruled out: Secondary | ICD-10-CM | POA: Diagnosis present

## 2022-09-01 DIAGNOSIS — Z711 Person with feared health complaint in whom no diagnosis is made: Secondary | ICD-10-CM

## 2022-09-01 NOTE — ED Notes (Signed)
No answer for triage.

## 2022-09-01 NOTE — ED Triage Notes (Signed)
Pt was brought back for triage. Unwilling to answer RN's questions to complete triage.

## 2022-09-01 NOTE — ED Notes (Signed)
Attempted to ask triage questions again pt mute and unwilling to answer. Pt asked to go to the lobby. Pt refused. Was escorted to lobby by security.

## 2022-09-01 NOTE — ED Notes (Signed)
Called x2 and no response

## 2022-09-02 NOTE — ED Provider Notes (Signed)
[facility] Provider Note   CSN: [id number] Arrival date & time: 09/01/22  2129     History  No chief complaint on file.   Justin Chaney is a 27 y.o. male.  Patient with history of polysubstance abuse, schizoaffective disorder, IVDU. Upon my assessment, patient refuses to speak to me. Upon initial assessment, patient sitting upright in his chair with his head in his hands and eyes closed. When I introduce myself he opens his eyes and looks up at me, however refuses to answer any questions despite multiple attempts. He then rolled his eyes and looked away. According to nursing staff he was ambulatory to his chair with a steady gait. Upon chart review, patient refused to speak with triage staff as well.  The history is provided by the patient. No language interpreter was used.       Home Medications Prior to Admission medications   Medication Sig Start Date End Date Taking? Authorizing Provider  ARIPiprazole (ABILIFY) 5 MG tablet Take 1 tablet (5 mg total) by mouth daily. Patient not taking: Reported on 11/09/2021 12/17/20   Ival Bible, MD      Allergies    Amoxicillin and Penicillin g    Review of Systems   Review of Systems  All other systems reviewed and are negative.   Physical Exam Updated Vital Signs BP (!) 151/98   Pulse (!) 105   Temp 98.8 F (37.1 C) (Oral)   Resp 16   SpO2 99%  Physical Exam Vitals and nursing note reviewed.  Constitutional:      General: He is not in acute distress.    Appearance: Normal appearance. He is normal weight. He is not ill-appearing, toxic-appearing or diaphoretic.     Comments: Sitting comfortably in his chair in no acute distress  HENT:     Head: Normocephalic and atraumatic.  Cardiovascular:     Rate and Rhythm: Normal rate.  Pulmonary:     Effort: Pulmonary effort is normal. No respiratory distress.  Musculoskeletal:        General: Normal range of motion.      Cervical back: Normal range of motion.  Skin:    General: Skin is warm and dry.  Neurological:     General: No focal deficit present.     Mental Status: He is alert.     Comments: Moving extremities equally, sitting upright without difficulty.   Psychiatric:        Mood and Affect: Mood normal.        Behavior: Behavior normal.     Comments: Does not appear to be responding to internal stimuli     ED Results / Procedures / Treatments   Labs (all labs ordered are listed, but only abnormal results are displayed) Labs Reviewed - No data to display  EKG None  Radiology No results found.  Procedures Procedures    Medications Ordered in ED Medications - No data to display  ED Course/ Medical Decision Making/ A&P                             Medical Decision Making  Patient presents today refusing to speak to staff upon multiple attempts by me, nursing staff, and triage PA. He is sitting upright in his chair in no distress. He was initially resting with his eyes closed and when I introduced myself he proceeded to open his eyes and made eye  contact with me. After several repeat questions patient does not respond. Patient then proceeded to roll his eyes and look away. According to nursing staff he walked back to his chair without difficulty. He is afebrile, nontoxic-appearing, and in no acute distress with reassuring vital signs. He does not exhibit signs of acute psychosis and does not appear to be responding to internal stimuli. He does not appear to be intoxicated. Given that despite multiple attempts he refuses to speak, plan for discharge. Patient informed of same and educated to return at any time. Patient discharged in stable condition.   Final Clinical Impression(s) / ED Diagnoses Final diagnoses:  No problem, feared complaint unfounded    Rx / DC Orders ED Discharge Orders     None     An After Visit Summary was printed and given to the patient.     Nestor Lewandowsky 09/02/22 0253    Maudie Flakes, MD 09/02/22 [phone]

## 2022-09-02 NOTE — ED Notes (Signed)
Patient currently sitting in lobby chair, with head leaned back against a wall.  Patient has his eyes closed, respirations even and unlabored.  Patient appears to be in NAD.

## 2022-09-02 NOTE — ED Notes (Signed)
Pt refused to let the NT get VS.. This RN informed pt that he was upfor Discharge. Pt states " why the doctor didn't even come."  Lavonna Rua, PA attempted to assess pt prior and pt refused to talk. Pt then gets up and walks out of the department; does not take discharge paperwork.

## 2022-09-02 NOTE — ED Notes (Signed)
PIT PA Alexander informed pt unwilling to answer triage questions. Pt cleared to return to lobby per PA.

## 2022-09-02 NOTE — Discharge Instructions (Signed)
Return if development of any new or worsening symptoms °

## 2022-09-17 ENCOUNTER — Ambulatory Visit (HOSPITAL_COMMUNITY)
Admission: EM | Admit: 2022-09-17 | Discharge: 2022-09-20 | Disposition: A | Discharge status: Other Acute Inpatient Hospital | Payer: Medicaid Other | Attending: Psychiatry | Admitting: Psychiatry

## 2022-09-17 DIAGNOSIS — Z1152 Encounter for screening for COVID-19: Secondary | ICD-10-CM | POA: Insufficient documentation

## 2022-09-17 DIAGNOSIS — F209 Schizophrenia, unspecified: Secondary | ICD-10-CM

## 2022-09-17 DIAGNOSIS — Z046 Encounter for general psychiatric examination, requested by authority: Secondary | ICD-10-CM

## 2022-09-17 MED ORDER — ACETAMINOPHEN 325 MG PO TABS: 650.0000 mg | ORAL_TABLET | Freq: Four times a day (QID) | ORAL | Status: DC | PRN

## 2022-09-17 MED ORDER — OLANZAPINE 10 MG PO TBDP: 10.0000 mg | ORAL_TABLET | Freq: Once | ORAL | Status: DC | PRN

## 2022-09-17 MED ORDER — MAGNESIUM HYDROXIDE 400 MG/5ML PO SUSP: 30.0000 mL | Freq: Every day | ORAL | Status: DC | PRN

## 2022-09-17 MED ORDER — OLANZAPINE 10 MG PO TBDP
10.0000 mg | ORAL_TABLET | Freq: Every day | ORAL | Status: DC
  Filled 2022-09-17: qty 1

## 2022-09-17 MED ORDER — OLANZAPINE 10 MG PO TBDP: 10.0000 mg | ORAL_TABLET | Freq: Every day | ORAL | Status: DC

## 2022-09-17 MED ORDER — TRAZODONE HCL 50 MG PO TABS
50.0000 mg | ORAL_TABLET | Freq: Every evening | ORAL | Status: DC | PRN
  Filled 2022-09-17: qty 1

## 2022-09-17 MED ORDER — ALUM & MAG HYDROXIDE-SIMETH 200-200-20 MG/5ML PO SUSP: 30.0000 mL | ORAL | Status: DC | PRN

## 2022-09-17 MED ORDER — HYDROXYZINE HCL 25 MG PO TABS
25.0000 mg | ORAL_TABLET | Freq: Three times a day (TID) | ORAL | Status: DC | PRN
  Filled 2022-09-17: qty 1

## 2022-09-17 MED ORDER — OLANZAPINE 10 MG IM SOLR: 10.0000 mg | INTRAMUSCULAR | Status: DC

## 2022-09-17 MED ORDER — OLANZAPINE 10 MG IM SOLR: 10.0000 mg | Freq: Once | INTRAMUSCULAR | Status: DC | PRN

## 2022-09-17 MED ORDER — OLANZAPINE 10 MG PO TBDP
10.0000 mg | ORAL_TABLET | ORAL | Status: DC
Start: 2022-09-17 — End: 2022-09-17

## 2022-09-17 NOTE — ED Notes (Addendum)
Pt up to nurse's station requesting more cranberry juice. RN also provided a [place] sandwich and chips per pt request. Pt appears to be more talkative with staff. Pt denies SI/HI/AVH at this time, although he is observed talking to himself and laughing. Pt reports no further needs. Pt calm and cooperative at present. No signs of acute distress noted. Monitoring for safety.

## 2022-09-17 NOTE — ED Notes (Signed)
Pt asleep in bed. Respirations even and unlabored. Monitoring for safety. 
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
## 2022-09-17 NOTE — ED Notes (Signed)
Pt was offered dinner but he refused.

## 2022-09-17 NOTE — Progress Notes (Signed)
Received Justin Chaney in the flex area after his refusal to cooperate with the admission assessment. He refused to talk with this Probation officer and several other staff members who have approached him. He is silent without eye contact. His shoes was taken from him before his arrival on the unit but the skin assessment  and clothes were not checked. Dr. Dwyane Dee approved his admission to the unit. He was given nourishments that was placed at the bedside. He refused PO medication and did not meet the criteria to medicate against his will. He refused both covid test and blood work.

## 2022-09-17 NOTE — ED Notes (Signed)
Meal provided and lemonaide drinks.
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
## 2022-09-17 NOTE — Progress Notes (Signed)
Tivis remained in the flex area with a visit to the bathroom. He remained silent with very little eye contact. He is not violent at this time, but not approachable. He is resting in his chair bed awake.

## 2022-09-17 NOTE — Discharge Instructions (Signed)
  Accepted  to [facility] [address]

## 2022-09-17 NOTE — ED Notes (Signed)
Faxed IVC paperwork to [phone] and received confirmation.

## 2022-09-17 NOTE — Progress Notes (Signed)
Justin Chaney was noted talking to someone who was not present. He continues to refuse a skin check and medication.
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
## 2022-09-17 NOTE — ED Notes (Addendum)
Pt observed sitting on bed in flex talking to self and looking around room. Pt will occasionally pace unit. Pt is alert to self, makes brief eye contact when spoken to, but does not participate in any further conversation with RN or other staff. Pt did accept offer for cranberry juice, declines offer for food. Unable to assess for SI/HI/AVH at this time. No signs of acute distress noted. Monitoring for safety.

## 2022-09-18 ENCOUNTER — Encounter (HOSPITAL_COMMUNITY): Payer: Self-pay | Admitting: Emergency Medicine

## 2022-09-18 LAB — HEMOGLOBIN A1C
Hgb A1c MFr Bld: 3.9 % — ABNORMAL LOW (ref 4.8–5.6)
Mean Plasma Glucose: 65.23 mg/dL

## 2022-09-18 LAB — CBC WITH DIFFERENTIAL/PLATELET
Abs Immature Granulocytes: 0.04 10*3/uL (ref 0.00–0.07)
Basophils Absolute: 0.1 10*3/uL (ref 0.0–0.1)
Basophils Relative: 1 %
Eosinophils Absolute: 0.2 10*3/uL (ref 0.0–0.5)
Eosinophils Relative: 2 %
HCT: 39.3 % (ref 39.0–52.0)
Hemoglobin: 13.3 g/dL (ref 13.0–17.0)
Immature Granulocytes: 1 %
Lymphocytes Relative: 33 %
Lymphs Abs: 2.8 10*3/uL (ref 0.7–4.0)
MCH: 29 pg (ref 26.0–34.0)
MCHC: 33.8 g/dL (ref 30.0–36.0)
MCV: 85.6 fL (ref 80.0–100.0)
Monocytes Absolute: 0.9 10*3/uL (ref 0.1–1.0)
Monocytes Relative: 10 %
Neutro Abs: 4.5 10*3/uL (ref 1.7–7.7)
Neutrophils Relative %: 53 %
Platelets: 288 10*3/uL (ref 150–400)
RBC: 4.59 MIL/uL (ref 4.22–5.81)
RDW: 11.8 % (ref 11.5–15.5)
WBC: 8.5 10*3/uL (ref 4.0–10.5)
nRBC: 0 % (ref 0.0–0.2)

## 2022-09-18 LAB — COMPREHENSIVE METABOLIC PANEL
ALT: 28 U/L (ref 0–44)
AST: 25 U/L (ref 15–41)
Albumin: 3.6 g/dL (ref 3.5–5.0)
Alkaline Phosphatase: 55 U/L (ref 38–126)
Anion gap: 8 (ref 5–15)
BUN: 10 mg/dL (ref 6–20)
CO2: 24 mmol/L (ref 22–32)
Calcium: 9 mg/dL (ref 8.9–10.3)
Chloride: 105 mmol/L (ref 98–111)
Creatinine, Ser: 0.78 mg/dL (ref 0.61–1.24)
GFR, Estimated: >60 mL/min (ref >60)
Glucose, Bld: 80 mg/dL (ref 70–99)
Potassium: 3.8 mmol/L (ref 3.5–5.1)
Sodium: 137 mmol/L (ref 135–145)
Total Bilirubin: <0.1 mg/dL — ABNORMAL LOW (ref 0.3–1.2)
Total Protein: 6.3 g/dL — ABNORMAL LOW (ref 6.5–8.1)

## 2022-09-18 LAB — LIPID PANEL
Cholesterol: 113 mg/dL (ref 0–200)
HDL: 46 mg/dL (ref >40)
LDL Cholesterol: 45 mg/dL (ref 0–99)
Total CHOL/HDL Ratio: 2.5 RATIO
Triglycerides: 112 mg/dL (ref <150)
VLDL: 22 mg/dL (ref 0–40)

## 2022-09-18 LAB — TSH: TSH: 0.848 u[IU]/mL (ref 0.350–4.500)

## 2022-09-18 LAB — ETHANOL: Alcohol, Ethyl (B): <10 mg/dL (ref <10)

## 2022-09-18 NOTE — ED Notes (Signed)
Pt is currently sleeping, no distress noted, environmental check complete, will continue to monitor patient for safety. ? ?

## 2022-09-18 NOTE — ED Notes (Signed)
Patient has been awake most of the morning.  He showered ate breakfast and lunch.  He is minimally verbal but makes needs known and is calm.  Continues to refuse to give blood work.  Remains suspicious and guarded.  Will monitor.
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
## 2022-09-18 NOTE — ED Notes (Signed)
Patient was provided with supplies to take a shower

## 2022-09-18 NOTE — Progress Notes (Signed)
Inpatient Behavioral Health Placement  Pt meets inpatient criteria per Ricky Ala, NP. There are no available beds at [facility] per Datto, RN. Referral was sent to the following facilities;   Destination  Service Provider Address Phone Fax  [facility]  [address] [place] [address] [phone]  [place] [address] [place] [address] [phone]  [facility]  [address] [place] [address] [place]  [facility]  [address] [place] [address] [phone]  [facility]  [place], [place] [address] [phone]  [facility]  [place] [address] [place] [address] [place]  [facility]  [address] [place] [address] [phone]  [facility]  [address], [place] [address] [phone]  [place] [address] [place] [address] [phone]  [facility]  [place] [address] [phone]  [facility]  [address] [place] [address] [phone] [facility]  [place], [place] [address] [phone] [place]  [address], [place] [address] [phone] [place]  [address] [place] [address] [phone]  [facility]  [address], [place] [address] [phone]    Situation ongoing,  CSW will follow up.   Benjaman Kindler, MSW, LCSWA 09/18/2022  @ 1:29 PM

## 2022-09-18 NOTE — ED Notes (Signed)
Pt asleep in bed. Respirations even and unlabored. Monitoring for safety. 

## 2022-09-19 DIAGNOSIS — F209 Schizophrenia, unspecified: Secondary | ICD-10-CM

## 2022-09-19 LAB — RESP PANEL BY RT-PCR (RSV, FLU A&B, COVID)  RVPGX2
Influenza A by PCR: NEGATIVE
Influenza B by PCR: NEGATIVE
Resp Syncytial Virus by PCR: NEGATIVE
SARS Coronavirus 2 by RT PCR: NEGATIVE

## 2022-09-19 LAB — POC SARS CORONAVIRUS 2 AG: SARSCOV2ONAVIRUS 2 AG: NEGATIVE

## 2022-09-19 NOTE — ED Notes (Signed)
Pt A&O x 4, sitting up at bedside, no distress noted.  Laughing inappropriately at times, Calm at present.  Monitoring for safety, pt is IVC.

## 2022-09-19 NOTE — ED Notes (Signed)
Patient is currently lying in bed quietly, no distress noted, respirations are even and unlabored, will continue to monitor patient for safety

## 2022-09-19 NOTE — ED Notes (Signed)
Pt sleeping in no acute distress. RR even and unlabored. Environment secured. Will continue to monitor for safety. 

## 2022-09-19 NOTE — ED Notes (Signed)
Pt refused all night time meds.

## 2022-09-19 NOTE — ED Notes (Signed)
Pt eating dinner in no acute distress. Polite with staff. Denies needs or concerns at present. Will continue to monitor for safety.

## 2022-09-19 NOTE — ED Notes (Signed)
Attempted to take pt vitals but pt continue to refuse. Pt states, "I'm not diabetic so I don't need no blood pressure taken. I'm good bruh". Pt refuse to let staff rationalize reason for taking vitals. Pt remain defiant, rude and demanding of needs and wants. Pt attempt to get in a debate with staff, unsuccessfully. Pt sitting up drinking water quietly at present. Safety maintained.

## 2022-09-19 NOTE — ED Notes (Signed)
Offered patient something for breakfast but patient declined

## 2022-09-19 NOTE — ED Provider Notes (Addendum)
Behavioral Health Progress Note  Date and Time: 09/19/2022 9:22 AM Name: Justin Chaney MRN:  [id number]  Subjective:  Justin Chaney continues to present obstinate and withdrawn.  Attempted to evaluate patient this morning with attending MD [place]. Patient observed with blankets placed over his head and was not responding to his name being called.  Per nursing staff patient continues to present guarded, flat and is selectively non-communicative.   Per chart review evening and night staff was able to establish rapport with patient on 09/19/2022 to allow them to collect basic labs.  Was reported patient continued to refuse COVID testing.  staff to continue to monitor for safety.  -Will request staff to continue to engage patient.  Please update chart with vitals.  Support, encouragement and  reassurance was provided.  Per admission assessment note: "Collateral from IVC petitioner, [name], [place] legal guardian, [phone]. She states she was assigned to pt last month. She states pt has been medication non-compliant on haldol injection and lithium for about a year. She states yesterday she went to see pt at the hotel room with her coworker. Pt had put all the furniture in the room on the side of the walls and the back of the room."     Diagnosis:  Final diagnoses:  Schizophrenia, unspecified type ([place])  Involuntary commitment    Total Time spent with patient: 15 minutes   Additional Social History:    Pain Medications: See MAR Prescriptions: See MAR Over the Counter: See MAR History of alcohol / drug use?: Yes (See MAR) Longest period of sobriety (when/how long): Unknown Negative Consequences of Use:  (Unknown) Withdrawal Symptoms: Other (Comment) (Unknown)                    Sleep: Fair  Appetite:  Good  Current Medications:  Current Facility-Administered Medications  Medication Dose Route Frequency Provider Last Rate Last Admin   acetaminophen (TYLENOL) tablet 650  mg  650 mg Oral Q6H PRN Tharon Aquas, NP       alum & mag hydroxide-simeth (MAALOX/MYLANTA) 200-200-20 MG/5ML suspension 30 mL  30 mL Oral Q4H PRN Tharon Aquas, NP       hydrOXYzine (ATARAX) tablet 25 mg  25 mg Oral TID PRN Tharon Aquas, NP       magnesium hydroxide (MILK OF MAGNESIA) suspension 30 mL  30 mL Oral Daily PRN Tharon Aquas, NP       OLANZapine zydis (ZYPREXA) disintegrating tablet 10 mg  10 mg Oral Once PRN Tharon Aquas, NP       Or   OLANZapine (ZYPREXA) injection 10 mg  10 mg Intramuscular Once PRN Tharon Aquas, NP       OLANZapine zydis (ZYPREXA) disintegrating tablet 10 mg  10 mg Oral QHS Tharon Aquas, NP       traZODone (DESYREL) tablet 50 mg  50 mg Oral QHS PRN Tharon Aquas, NP       No current outpatient medications on file.    Labs  Lab Results:  Admission on 09/17/2022  Component Date Value Ref Range Status   WBC 09/18/2022 8.5  4.0 - 10.5 K/uL Final   RBC 09/18/2022 4.59  4.22 - 5.81 MIL/uL Final   Hemoglobin 09/18/2022 13.3  13.0 - 17.0 g/dL Final   HCT 09/18/2022 39.3  39.0 - 52.0 % Final   MCV 09/18/2022 85.6  80.0 - 100.0 fL Final   MCH 09/18/2022 29.0  26.0 - 34.0 pg Final  MCHC 09/18/2022 33.8  30.0 - 36.0 g/dL Final   RDW 09/18/2022 11.8  11.5 - 15.5 % Final   Platelets 09/18/2022 288  150 - 400 K/uL Final   nRBC 09/18/2022 0.0  0.0 - 0.2 % Final   Neutrophils Relative % 09/18/2022 53  % Final   Neutro Abs 09/18/2022 4.5  1.7 - 7.7 K/uL Final   Lymphocytes Relative 09/18/2022 33  % Final   Lymphs Abs 09/18/2022 2.8  0.7 - 4.0 K/uL Final   Monocytes Relative 09/18/2022 10  % Final   Monocytes Absolute 09/18/2022 0.9  0.1 - 1.0 K/uL Final   Eosinophils Relative 09/18/2022 2  % Final   Eosinophils Absolute 09/18/2022 0.2  0.0 - 0.5 K/uL Final   Basophils Relative 09/18/2022 1  % Final   Basophils Absolute 09/18/2022 0.1  0.0 - 0.1 K/uL Final   Immature Granulocytes 09/18/2022 1  % Final   Abs  Immature Granulocytes 09/18/2022 0.04  0.00 - 0.07 K/uL Final   Performed at [facility], [place] [address] [place], [place] [address]   Sodium 09/18/2022 137  135 - 145 mmol/L Final   Potassium 09/18/2022 3.8  3.5 - 5.1 mmol/L Final   Chloride 09/18/2022 105  98 - 111 mmol/L Final   CO2 09/18/2022 24  22 - 32 mmol/L Final   Glucose, Bld 09/18/2022 80  70 - 99 mg/dL Final   Glucose reference range applies only to samples taken after fasting for at least 8 hours.   BUN 09/18/2022 10  6 - 20 mg/dL Final   Creatinine, Ser 09/18/2022 0.78  0.61 - 1.24 mg/dL Final   Calcium 09/18/2022 9.0  8.9 - 10.3 mg/dL Final   Total Protein 09/18/2022 6.3 (L)  6.5 - 8.1 g/dL Final   Albumin 09/18/2022 3.6  3.5 - 5.0 g/dL Final   AST 09/18/2022 25  15 - 41 U/L Final   ALT 09/18/2022 28  0 - 44 U/L Final   Alkaline Phosphatase 09/18/2022 55  38 - 126 U/L Final   Total Bilirubin 09/18/2022 <0.1 (L)  0.3 - 1.2 mg/dL Final   GFR, Estimated 09/18/2022 >60  >60 mL/min Final   Comment: (NOTE) Calculated using the CKD-EPI Creatinine Equation (2021)    Anion gap 09/18/2022 8  5 - 15 Final   Performed at [facility] [address] [place], [place] [address]   Alcohol, Ethyl (B) 09/18/2022 <10  <10 mg/dL Final   Comment: (NOTE) Lowest detectable limit for serum alcohol is 10 mg/dL.  For medical purposes only. Performed at [facility], [place] [address], [place], [place] [address]    Hgb A1c MFr Bld 09/18/2022 3.9 (L)  4.8 - 5.6 % Final   Comment: (NOTE) Pre diabetes:          5.7%-6.4%  Diabetes:              >6.4%  Glycemic control for   <7.0% adults with diabetes    Mean Plasma Glucose 09/18/2022 65.23  mg/dL Final   Performed at [facility], [place] [address], [place], [place] [address]   Cholesterol 09/18/2022 113  0 - 200 mg/dL Final   Triglycerides 09/18/2022 112  <150 mg/dL Final   HDL 09/18/2022 46  >40 mg/dL Final   Total CHOL/HDL Ratio 09/18/2022 2.5  RATIO Final    VLDL 09/18/2022 22  0 - 40 mg/dL Final   LDL Cholesterol 09/18/2022 45  0 - 99 mg/dL  Final   Comment:        Total Cholesterol/HDL:CHD Risk Coronary Heart Disease Risk Table                     Men   Women  1/2 Average Risk   3.4   3.3  Average Risk       5.0   4.4  2 X Average Risk   9.6   7.1  3 X Average Risk  23.4   11.0        Use the calculated Patient Ratio above and the CHD Risk Table to determine the patient's CHD Risk.        ATP III CLASSIFICATION (LDL):  <100     mg/dL   Optimal  100-129  mg/dL   Near or Above                    Optimal  130-159  mg/dL   Borderline  160-189  mg/dL   High  >190     mg/dL   Very High Performed at [facility] [address], [place], [place] [address]    TSH 09/18/2022 0.848  0.350 - 4.500 uIU/mL Final   Comment: Performed by a 3rd Generation assay with a functional sensitivity of <=0.01 uIU/mL. Performed at [facility], [place] [address], [place], [place] [address]   Admission on 08/22/2022, Discharged on 08/22/2022  Component Date Value Ref Range Status   WBC 08/22/2022 9.4  4.0 - 10.5 K/uL Final   RBC 08/22/2022 5.33  4.22 - 5.81 MIL/uL Final   Hemoglobin 08/22/2022 15.4  13.0 - 17.0 g/dL Final   HCT 08/22/2022 45.6  39.0 - 52.0 % Final   MCV 08/22/2022 85.6  80.0 - 100.0 fL Final   MCH 08/22/2022 28.9  26.0 - 34.0 pg Final   MCHC 08/22/2022 33.8  30.0 - 36.0 g/dL Final   RDW 08/22/2022 11.9  11.5 - 15.5 % Final   Platelets 08/22/2022 196  150 - 400 K/uL Final   nRBC 08/22/2022 0.0  0.0 - 0.2 % Final   Neutrophils Relative % 08/22/2022 67  % Final   Neutro Abs 08/22/2022 6.2  1.7 - 7.7 K/uL Final   Lymphocytes Relative 08/22/2022 22  % Final   Lymphs Abs 08/22/2022 2.1  0.7 - 4.0 K/uL Final   Monocytes Relative 08/22/2022 9  % Final   Monocytes Absolute 08/22/2022 0.9  0.1 - 1.0 K/uL Final   Eosinophils Relative 08/22/2022 2  % Final   Eosinophils Absolute 08/22/2022 0.2  0.0 - 0.5 K/uL Final   Basophils  Relative 08/22/2022 0  % Final   Basophils Absolute 08/22/2022 0.0  0.0 - 0.1 K/uL Final   Immature Granulocytes 08/22/2022 0  % Final   Abs Immature Granulocytes 08/22/2022 0.04  0.00 - 0.07 K/uL Final   Performed at [facility], [place] [address], [place], [place] [address]   Sodium 08/22/2022 136  135 - 145 mmol/L Final   Potassium 08/22/2022 3.4 (L)  3.5 - 5.1 mmol/L Final   Chloride 08/22/2022 106  98 - 111 mmol/L Final   CO2 08/22/2022 21 (L)  22 - 32 mmol/L Final   Glucose, Bld 08/22/2022 149 (H)  70 - 99 mg/dL Final   Glucose reference range applies only to samples taken after fasting for at least 8 hours.   BUN 08/22/2022 14  6 - 20 mg/dL Final   Creatinine, Ser  08/22/2022 0.58 (L)  0.61 - 1.24 mg/dL Final   Calcium 08/22/2022 9.2  8.9 - 10.3 mg/dL Final   Total Protein 08/22/2022 7.3  6.5 - 8.1 g/dL Final   Albumin 08/22/2022 4.3  3.5 - 5.0 g/dL Final   AST 08/22/2022 26  15 - 41 U/L Final   ALT 08/22/2022 22  0 - 44 U/L Final   Alkaline Phosphatase 08/22/2022 57  38 - 126 U/L Final   Total Bilirubin 08/22/2022 0.9  0.3 - 1.2 mg/dL Final   GFR, Estimated 08/22/2022 >60  >60 mL/min Final   Comment: (NOTE) Calculated using the CKD-EPI Creatinine Equation (2021)    Anion gap 08/22/2022 9  5 - 15 Final   Performed at [facility] [address], [place], [place] [address]   Alcohol, Ethyl (B) 08/22/2022 <10  <10 mg/dL Final   Comment: (NOTE) Lowest detectable limit for serum alcohol is 10 mg/dL.  For medical purposes only. Performed at [facility], [place] [address], [place], [place] [address]    Opiates 08/22/2022 NONE DETECTED  NONE DETECTED Final   Cocaine 08/22/2022 NONE DETECTED  NONE DETECTED Final   Benzodiazepines 08/22/2022 NONE DETECTED  NONE DETECTED Final   Amphetamines 08/22/2022 NONE DETECTED  NONE DETECTED Final   Tetrahydrocannabinol 08/22/2022 NONE DETECTED  NONE DETECTED Final   Barbiturates 08/22/2022 NONE DETECTED  NONE DETECTED Final    Comment: (NOTE) DRUG SCREEN FOR MEDICAL PURPOSES ONLY.  IF CONFIRMATION IS NEEDED FOR ANY PURPOSE, NOTIFY LAB WITHIN 5 DAYS.  LOWEST DETECTABLE LIMITS FOR URINE DRUG SCREEN Drug Class                     Cutoff (ng/mL) Amphetamine and metabolites    1000 Barbiturate and metabolites    200 Benzodiazepine                 200 Opiates and metabolites        300 Cocaine and metabolites        300 THC                            50 Performed at [facility], [place] [address], [place], [place] [id number]    Salicylate Lvl [id number] <7.0 (L)  7.0 - 30.0 mg/dL Final   Performed at [facility] [address], [place], [place] [address]   Acetaminophen (Tylenol), Serum 08/22/2022 <10 (L)  10 - 30 ug/mL Final   Comment: (NOTE) Therapeutic concentrations vary significantly. A range of 10-30 ug/mL  may be an effective concentration for many patients. However, some  are best treated at concentrations outside of this range. Acetaminophen concentrations >150 ug/mL at 4 hours after ingestion  and >50 ug/mL at 12 hours after ingestion are often associated with  toxic reactions.  Performed at [facility], [place] [address] [place], [place] [address]     Blood Alcohol level:  Lab Results  Component Value Date   [facility] <10 09/18/2022   ETH <10 [id number]    Metabolic Disorder Labs: Lab Results  Component Value Date   HGBA1C 3.9 (L) 09/18/2022   MPG 65.23 09/18/2022   MPG 97 12/22/2016   Lab Results  Component Value Date   PROLACTIN 2.4 (L) 12/22/2016   PROLACTIN 8.0 09/09/2015   Lab Results  Component Value Date   CHOL 113 09/18/2022   TRIG 112 09/18/2022   HDL  46 09/18/2022   CHOLHDL 2.5 09/18/2022   VLDL 22 09/18/2022   LDLCALC 45 09/18/2022   LDLCALC 56 12/22/2016    Therapeutic Lab Levels: Lab Results  Component Value Date   LITHIUM <0.06 (L) 08/11/2019   LITHIUM 0.06 (L) 07/22/2019   No results found for: "VALPROATE" Lab Results  Component Value  Date   CBMZ <2.0 (L) 10/12/2014   CBMZ <0.5 (L) 04/01/2014    Physical Findings   AIMS    Flowsheet Row Admission (Discharged) from 12/13/2019 in [place] Admission (Discharged) from 01/10/2019 in [place] 500[facility] Admission (Discharged) from 12/20/2016 in [place] 500[facility] Admission (Discharged) from 11/18/2015 in [place] Admission (Discharged) from 11/06/2015 in [place] 500B  AIMS Total Score 0 0 0 0 0      AUDIT    Flowsheet Row Admission (Discharged) from 12/13/2019 in [place] Admission (Discharged) from 01/10/2019 in [place] 500B Admission (Discharged) from 12/20/2016 in [place] [facility] Admission (Discharged) from 11/06/2015 in [place] 500B Admission (Discharged) from 10/11/2015 in [place] 400B  Alcohol Use Disorder Identification Test Final Score (AUDIT) 3 0 0 0 0      Flowsheet Row ED from 08/22/2022 in [facility] [place] Emergency Department at [facility] ED from 11/14/2021 in [facility] Emergency Department at [facility] ED from 11/09/2021 in [facility] Emergency Department at [place] No Risk No Risk No Risk        Musculoskeletal  Strength & Muscle Tone: within normal limits Gait & Station: normal Patient leans: N/A  Psychiatric Specialty Exam  Presentation  General Appearance:  Bizarre; Disheveled  Eye Contact: None  Speech: Other (comment) (uncooperative, uta)  Speech Volume: Other (comment) (uncooperative, uta)  Handedness: -- (uncooperative, uta)   Mood and Affect  Mood: -- (uncooperative, uta)  Affect: Flat   Thought Process  Thought Processes: Other (comment) (uncooperative, uta)  Descriptions of Associations:--  (uncooperative, uta)  Orientation:Other (comment) (uncooperative, uta)  Thought Content:Other (comment) (uncooperative, uta)  Diagnosis of Schizophrenia or Schizoaffective disorder in past: Yes  Duration of Psychotic Symptoms: Less than six months   Hallucinations:No data recorded Ideas of Reference:Other (comment) (uncooperative, uta)  Suicidal Thoughts:No data recorded Homicidal Thoughts:No data recorded  Sensorium  Memory: Other (comment) (uncooperative, uta)  Judgment: Other (comment) (uncooperative, uta)  Insight: Other (comment) (uncooperative, uta)   Executive Functions  Concentration: Other (comment) (uncooperative, uta)  Attention Span: Other (comment) (uncooperative, uta)  Recall: Other (comment) (uncooperative, uta)  Fund of Knowledge: Other (comment) (uncooperative, uta)  Language: Other (comment) (uncooperative, uta)   Psychomotor Activity  Psychomotor Activity:No data recorded  Assets  Assets: Financial Resources/Insurance   Sleep  Sleep:No data recorded  No data recorded  Physical Exam  Physical Exam Nursing note reviewed.    Review of Systems  All other systems reviewed and are negative.  Blood pressure (!) 149/99, pulse 100, resp. rate 18, SpO2 99 %. There is no height or weight on file to calculate BMI.  Treatment Plan Summary: Daily contact with patient to assess and evaluate symptoms and progress in treatment and Medication management  -Please continue to engage patient.  Patient needs a updated set of vitals. BH to consider after 2 sets of vitals.   Derrill Center, NP 09/19/2022 9:22 AM

## 2022-09-19 NOTE — ED Notes (Signed)
Patient refused to have his vitals taken

## 2022-09-19 NOTE — ED Notes (Signed)
Pt approached nurses station stating, "what I gotta do to get moved to another hospital with more comfortable beds"?. Staff informed pt that he have to be cooperative with staff and allow staff to obtain his vitals and COVID testing. Pt complied. Pt given meal per pt request. No acute distress noted. Safety maintained.

## 2022-09-19 NOTE — ED Notes (Signed)
Pt continue to refuse to allow staff to obtain his vitals. Pt standing at window looking outside. Refuse communication with staff. Safety maintained.

## 2022-09-19 NOTE — ED Notes (Signed)
Pt awake & resting at present, no distress noted.  Monitoring for safety.

## 2022-09-19 NOTE — ED Notes (Signed)
Pt standing to window. Staff offered meal and outside time but pt refuse all attempts at communicating. Pt spitting in window seal. Unable to be redirected by staff. Pt states, "go away bruh". No more communication noted.

## 2022-09-20 ENCOUNTER — Inpatient Hospital Stay (HOSPITAL_COMMUNITY)
Admission: AD | Admit: 2022-09-20 | Discharge: 2022-10-01 | DRG: 885 | Disposition: A | Discharge status: Home / Self Care | Payer: Medicaid Other | Source: Intra-hospital | Attending: Psychiatry | Admitting: Psychiatry

## 2022-09-20 ENCOUNTER — Encounter (HOSPITAL_COMMUNITY): Payer: Self-pay | Admitting: Family

## 2022-09-20 DIAGNOSIS — Z91148 Patient's other noncompliance with medication regimen for other reason: Secondary | ICD-10-CM

## 2022-09-20 DIAGNOSIS — F209 Schizophrenia, unspecified: Secondary | ICD-10-CM | POA: Diagnosis present

## 2022-09-20 DIAGNOSIS — Z79899 Other long term (current) drug therapy: Secondary | ICD-10-CM

## 2022-09-20 DIAGNOSIS — Z880 Allergy status to penicillin: Secondary | ICD-10-CM | POA: Diagnosis not present

## 2022-09-20 DIAGNOSIS — F17210 Nicotine dependence, cigarettes, uncomplicated: Secondary | ICD-10-CM | POA: Diagnosis present

## 2022-09-20 DIAGNOSIS — Z20822 Contact with and (suspected) exposure to covid-19: Secondary | ICD-10-CM | POA: Diagnosis present

## 2022-09-20 DIAGNOSIS — F250 Schizoaffective disorder, bipolar type: Secondary | ICD-10-CM | POA: Diagnosis present

## 2022-09-20 MED ORDER — ALUM & MAG HYDROXIDE-SIMETH 200-200-20 MG/5ML PO SUSP: 30.0000 mL | ORAL | Status: DC | PRN

## 2022-09-20 MED ORDER — HALOPERIDOL LACTATE 5 MG/ML IJ SOLN
5.0000 mg | Freq: Three times a day (TID) | INTRAMUSCULAR | Status: DC | PRN
  Administered 2022-09-22 – 2022-09-24 (×2): 5 mg via INTRAMUSCULAR
  Filled 2022-09-20 (×2): qty 1

## 2022-09-20 MED ORDER — OLANZAPINE 5 MG PO TBDP
5.0000 mg | ORAL_TABLET | Freq: Every day | ORAL | Status: DC
  Filled 2022-09-20: qty 1

## 2022-09-20 MED ORDER — LORAZEPAM 1 MG PO TABS: 1.0000 mg | ORAL_TABLET | ORAL | Status: DC | PRN

## 2022-09-20 MED ORDER — TRAZODONE HCL 50 MG PO TABS: 50.0000 mg | ORAL_TABLET | Freq: Every evening | ORAL | Status: DC | PRN

## 2022-09-20 MED ORDER — HALOPERIDOL 5 MG PO TABS
5.0000 mg | ORAL_TABLET | Freq: Three times a day (TID) | ORAL | Status: DC | PRN
  Administered 2022-09-23: 5 mg via ORAL
  Filled 2022-09-20: qty 1

## 2022-09-20 MED ORDER — DIPHENHYDRAMINE HCL 25 MG PO CAPS
50.0000 mg | ORAL_CAPSULE | Freq: Three times a day (TID) | ORAL | Status: DC | PRN
  Administered 2022-09-23: 50 mg via ORAL
  Filled 2022-09-20 (×2): qty 2

## 2022-09-20 MED ORDER — HYDROXYZINE HCL 25 MG PO TABS
25.0000 mg | ORAL_TABLET | Freq: Three times a day (TID) | ORAL | Status: DC | PRN
  Administered 2022-09-22 – 2022-09-25 (×2): 25 mg via ORAL
  Filled 2022-09-20 (×2): qty 1

## 2022-09-20 MED ORDER — ZIPRASIDONE MESYLATE 20 MG IM SOLR: 20.0000 mg | Freq: Two times a day (BID) | INTRAMUSCULAR | Status: DC | PRN

## 2022-09-20 MED ORDER — LORAZEPAM 1 MG PO TABS
2.0000 mg | ORAL_TABLET | Freq: Three times a day (TID) | ORAL | Status: DC | PRN
  Administered 2022-09-23: 2 mg via ORAL
  Filled 2022-09-20: qty 2

## 2022-09-20 MED ORDER — DIPHENHYDRAMINE HCL 50 MG/ML IJ SOLN
50.0000 mg | Freq: Three times a day (TID) | INTRAMUSCULAR | Status: DC | PRN
  Administered 2022-09-22 – 2022-09-24 (×2): 50 mg via INTRAMUSCULAR
  Filled 2022-09-20 (×3): qty 1

## 2022-09-20 MED ORDER — OLANZAPINE 10 MG PO TBDP
10.0000 mg | ORAL_TABLET | Freq: Two times a day (BID) | ORAL | Status: DC
  Filled 2022-09-20 (×5): qty 1

## 2022-09-20 MED ORDER — ACETAMINOPHEN 325 MG PO TABS: 650.0000 mg | ORAL_TABLET | Freq: Four times a day (QID) | ORAL | Status: DC | PRN

## 2022-09-20 MED ORDER — MAGNESIUM HYDROXIDE 400 MG/5ML PO SUSP: 30.0000 mL | Freq: Every day | ORAL | Status: DC | PRN

## 2022-09-20 MED ORDER — LORAZEPAM 2 MG/ML IJ SOLN
2.0000 mg | Freq: Three times a day (TID) | INTRAMUSCULAR | Status: DC | PRN
  Administered 2022-09-22 – 2022-09-24 (×2): 2 mg via INTRAMUSCULAR
  Filled 2022-09-20 (×3): qty 1

## 2022-09-20 NOTE — Progress Notes (Signed)
Tomas remains visible in the flex area and making frequent visits to the bathroom. He is taking PO solids and fluids and snacks. He remains mostly mute except when addressing his personal needs such as food and the bathroom.

## 2022-09-20 NOTE — Progress Notes (Signed)
Pt currently in bathroom angrily communicating with himself while repeatedly flushing the toilet and slamming the bathroom door. Pt was politely asked to keep his noise level down and responded by continuing to flush the toilet and making aggressive remarks towards staff. Will continue to monitor.

## 2022-09-20 NOTE — ED Provider Notes (Signed)
FBC/OBS ASAP Discharge Summary  Date and Time: 09/20/2022 11:05 AM  Name: Justin Chaney  MRN:  [id number]   Discharge Diagnoses:  Final diagnoses:  Schizophrenia, unspecified type [facility])  Involuntary commitment    Subjective:  [name] continues to present non-communicative, irritable and defiant.  Patient was advised that he was accepted to [facility]. Thumbs up noted. It was reported that patient has been up for most of the night, flushing toothpaste and tooth brush down the toilet. Patient refused schedule medications.NP spoke to DDS guardian about discharge transfer. She was receptive to plan. [employer] has requested to speak to patient do to missing person report.- [facility] will follow[facility]up with patient at [facility].   Per admission assessment note: "Collateral from IVC petitioner, [name], [place] legal guardian, [phone]. She states she was assigned to pt last month. She states pt has been medication non-compliant on haldol injection and lithium for about a year. She states yesterday she went to see pt at the hotel room with her coworker. Pt had put all the furniture in the room on the side of the walls and the back of the room."     Stay Summary:  Home medications were restarted as appropriate. No seclusion or restraints. 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 Past Medical History: S/P VP shunt, Hyperbilirubinemia and Thrombocytopenia   Family History: unknown  Family Psychiatric History: Unknown Social History: substance use disorder, DSS legal guardian  Tobacco Cessation:  N/A, patient does not currently use tobacco products  Current Medications:  Current Facility-Administered Medications  Medication Dose Route Frequency Provider Last Rate Last Admin    acetaminophen (TYLENOL) tablet 650 mg  650 mg Oral Q6H PRN Tharon Aquas, NP       alum & mag hydroxide-simeth (MAALOX/MYLANTA) 200-200-20 MG/5ML suspension 30 mL  30 mL Oral Q4H PRN Tharon Aquas, NP       hydrOXYzine (ATARAX) tablet 25 mg  25 mg Oral TID PRN Tharon Aquas, NP       magnesium hydroxide (MILK OF MAGNESIA) suspension 30 mL  30 mL Oral Daily PRN Tharon Aquas, NP       OLANZapine zydis (ZYPREXA) disintegrating tablet 10 mg  10 mg Oral Once PRN Tharon Aquas, NP       Or   OLANZapine (ZYPREXA) injection 10 mg  10 mg Intramuscular Once PRN Tharon Aquas, NP       OLANZapine zydis (ZYPREXA) disintegrating tablet 10 mg  10 mg Oral QHS Tharon Aquas, NP       traZODone (DESYREL) tablet 50 mg  50 mg Oral QHS PRN Tharon Aquas, NP       No current outpatient medications on file.    PTA Medications:  Facility Ordered Medications  Medication   acetaminophen (TYLENOL) tablet 650 mg   alum & mag hydroxide-simeth (MAALOX/MYLANTA) 200-200-20 MG/5ML suspension 30 mL   magnesium hydroxide (MILK OF MAGNESIA) suspension 30 mL   hydrOXYzine (ATARAX) tablet 25 mg   traZODone (DESYREL) tablet 50 mg   OLANZapine zydis (ZYPREXA) disintegrating tablet 10 mg   OLANZapine zydis (ZYPREXA) disintegrating tablet 10 mg   Or   OLANZapine (ZYPREXA) injection 10 mg        No data to display  Flowsheet Row ED from 08/22/2022 in [facility] Emergency Department at [facility] ED from 11/14/2021 in [facility] Emergency Department at [facility] ED from 11/09/2021 in [facility] Emergency Department at [place] No Risk No Risk No Risk       Musculoskeletal  Strength & Muscle Tone: within normal limits Gait & Station: normal Patient leans: N/A  Psychiatric Specialty Exam  Presentation  General Appearance:  Bizarre; Disheveled  Eye Contact: None  Speech: Other (comment)  (uncooperative, uta)  Speech Volume: Other (comment) (uncooperative, uta)  Handedness: -- (uncooperative, uta)   Mood and Affect  Mood: -- (uncooperative, uta)  Affect: Flat   Thought Process  Thought Processes: Other (comment) (uncooperative, uta)  Descriptions of Associations:-- (uncooperative, uta)  Orientation:Other (comment) (uncooperative, uta)  Thought Content:Other (comment) (uncooperative, uta)  Diagnosis of Schizophrenia or Schizoaffective disorder in past: Yes  Duration of Psychotic Symptoms: Less than six months   Hallucinations:No data recorded Ideas of Reference:Other (comment) (uncooperative, uta)  Suicidal Thoughts:No data recorded Homicidal Thoughts:No data recorded  Sensorium  Memory: Other (comment) (uncooperative, uta)  Judgment: Other (comment) (uncooperative, uta)  Insight: Other (comment) (uncooperative, uta)   Executive Functions  Concentration: Other (comment) (uncooperative, uta)  Attention Span: Other (comment) (uncooperative, uta)  Recall: Other (comment) (uncooperative, uta)  Fund of Knowledge: Other (comment) (uncooperative, uta)  Language: Other (comment) (uncooperative, uta)   Psychomotor Activity  Psychomotor Activity:No data recorded  Assets  Assets: Financial Resources/Insurance   Sleep  Sleep:No data recorded  No data recorded  Physical Exam  Physical Exam Vitals and nursing note reviewed.  Cardiovascular:     Rate and Rhythm: Normal rate and regular rhythm.  Pulmonary:     Effort: Pulmonary effort is normal.     Breath sounds: Normal breath sounds.  Neurological:     Mental Status: He is alert and oriented to person, place, and time.  Psychiatric:        Mood and Affect: Mood normal.        Behavior: Behavior normal.    ROS Blood pressure (!) 144/76, pulse 95, temperature 97.6 F (36.4 C), temperature source Oral, resp. rate 18, SpO2 100 %. There is no height or weight on file to  calculate BMI.  Demographic Factors:  Male  Loss Factors: Financial problems/change in socioeconomic status  Historical Factors: Family history of mental illness or substance abuse and Impulsivity  Risk Reduction Factors:   NA  Continued Clinical Symptoms:  Schizophrenia:   Paranoid or undifferentiated type  Cognitive Features That Contribute To Risk:  Closed-mindedness    Suicide Risk:  Minimal: No identifiable suicidal ideation.  Patients presenting with no risk factors but with morbid ruminations; may be classified as minimal risk based on the severity of the depressive symptoms  Plan Of Care/Follow-up recommendations:  Activity:  as tolerated Diet:  heart healthy  Disposition: Accepted to [facility] 500 unit  Derrill Center, NP 09/20/2022, 11:05 AM

## 2022-09-20 NOTE — Progress Notes (Signed)
D- Patient alert and oriented. Patient uncooperative with assessment and would not answer questions. Patient refused medications after multiple requests stating, "You know I aint taking no meds." Patient irritable with staff during routine safety checks. Patient remains isolative to his room.  A- . Support and encouragement provided.  Routine safety checks conducted every 15 minutes.  Patient informed to notify staff with problems or concerns.  R- Patient remains safe at this time.

## 2022-09-20 NOTE — Progress Notes (Signed)
Received Justin Chaney this AM awake in his chair bed. He is quiet with intervals of drifting off to sleep.

## 2022-09-20 NOTE — Progress Notes (Signed)
Pt admitted at this time. Pt refused height assessment as well as skin and clothing search. Pt presents with irritable affect. Pt refused assessment questioning. Pt demanding to go to unit. Pt escorted to unit by staff. Pt went to room and slammed door shut.

## 2022-09-20 NOTE — ED Notes (Signed)
Pt awake & resting at present, no distress noted.  Pt rambles to self at times.l  Monitoring for safety.

## 2022-09-20 NOTE — Progress Notes (Signed)
AC on duty notified about patient refusing to complete safety search. After consultation with [facility] writer was informed to escort patient to the unit without search. While escorting patient to unit, patient was noted to be irritable, pushing on locked doors and walking in close proximity to staff and peers as if to intentionally bump into the. Patient went into his room and closed the door.

## 2022-09-20 NOTE — Progress Notes (Signed)
Pt was encouraged but refused to attend group discussion

## 2022-09-20 NOTE — ED Notes (Signed)
Justin Chaney was transported via [employer] without incident with the appropriate paperwork.

## 2022-09-20 NOTE — Progress Notes (Signed)
Pt became agitated with staff and asked writer to not open his door. Writer explained to pt the 15 minute checks safety protocol.

## 2022-09-20 NOTE — ED Notes (Signed)
Patient refused vitals.

## 2022-09-20 NOTE — Progress Notes (Signed)
Attempted to get pt vs. Pt refused to acknowledge writer and when coming to see pt he covered his face and then states '' man get the fuck up out of my fucking face. ''

## 2022-09-20 NOTE — Progress Notes (Signed)
Pt continues to remain in room. Pt was noted shower and put clothes back on. Pt continues to refuse compliance with staff instructions or answer questions. Q 15 minute checks ongoing.

## 2022-09-20 NOTE — Plan of Care (Signed)
  Problem: Education: Goal: Emotional status will improve Outcome: Not Progressing Goal: Mental status will improve Outcome: Not Progressing Goal: Verbalization of understanding the information provided will improve Outcome: Not Progressing   Problem: Health Behavior/Discharge Planning: Goal: Compliance with treatment plan for underlying cause of condition will improve Outcome: Not Progressing

## 2022-09-20 NOTE — Progress Notes (Addendum)
Pt was accepted to [facility] 09/20/2022, pending IVC paperwork faxed to [phone]. Bed assignment: [id number]  Pt meets inpatient criteria per Ricky Ala, NP  Attending Physician will be Janine Limbo, MD  Report can be called to: - Adult unit: [phone]  Pt can arrive after pending items are received; [facility] AC to coordinate arrival time  Care Team Notified: [facility] Lynnda Shields, RN, Ricky Ala, NP, Roxy Manns, RN, Leonia Reader, RN, Will Hilma Favors, RN, Oris Drone, RN, and [address], LCSWA  [place], [place]  09/20/2022 11:52 AM

## 2022-09-21 DIAGNOSIS — F250 Schizoaffective disorder, bipolar type: Secondary | ICD-10-CM

## 2022-09-21 MED ORDER — NICOTINE 14 MG/24HR TD PT24: 14.0000 mg | MEDICATED_PATCH | Freq: Every day | TRANSDERMAL | Status: DC | PRN

## 2022-09-21 MED ORDER — HALOPERIDOL 5 MG PO TABS
10.0000 mg | ORAL_TABLET | Freq: Two times a day (BID) | ORAL | Status: DC
  Administered 2022-09-22 – 2022-09-23 (×3): 10 mg via ORAL
  Filled 2022-09-21 (×9): qty 2

## 2022-09-21 MED ORDER — HALOPERIDOL LACTATE 5 MG/ML IJ SOLN
5.0000 mg | Freq: Two times a day (BID) | INTRAMUSCULAR | Status: DC
  Administered 2022-09-21: 5 mg via INTRAMUSCULAR
  Filled 2022-09-21 (×9): qty 1

## 2022-09-21 MED ORDER — BENZTROPINE MESYLATE 1 MG/ML IJ SOLN
0.5000 mg | Freq: Two times a day (BID) | INTRAMUSCULAR | Status: DC
  Administered 2022-09-21 – 2022-09-26 (×3): 0.5 mg via INTRAMUSCULAR
  Filled 2022-09-21 (×18): qty 0.5
  Filled 2022-09-21: qty 2
  Filled 2022-09-21 (×2): qty 0.5
  Filled 2022-09-21: qty 2

## 2022-09-21 MED ORDER — BENZTROPINE MESYLATE 0.5 MG PO TABS
0.5000 mg | ORAL_TABLET | Freq: Two times a day (BID) | ORAL | Status: DC
  Administered 2022-09-22 – 2022-10-01 (×16): 0.5 mg via ORAL
  Filled 2022-09-21 (×31): qty 1

## 2022-09-21 MED ORDER — BENZTROPINE MESYLATE 1 MG/ML IJ SOLN: 2.0000 mg | Freq: Two times a day (BID) | INTRAMUSCULAR | Status: DC | PRN

## 2022-09-21 NOTE — Progress Notes (Signed)
Patient is irritable. Refusing to take medication. Refused to answer questions during assessment. Patient only responds saying "I'm sleeping right now" to every question asked. Patient laying in bed fully clothing.
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
## 2022-09-21 NOTE — Progress Notes (Signed)
Adult Psychoeducational Group Note  Date:  09/21/2022 Time:  8:57 PM  Group Topic/Focus:  Wrap-Up Group:   The focus of this group is to help patients review their daily goal of treatment and discuss progress on daily workbooks.  Participation Level:  Did Not Attend  Participation Quality:   Did Not Attend  Affect:   Did Not Attend  Cognitive:  Did Not Attend  Insight: None  Engagement in Group:  Did Not Attend  Modes of Intervention:  Did Not Attend  Additional Comments:   Pt was encouraged to attend wrap up group but did not attend. Candy Sledge 09/21/2022, 8:57 PM

## 2022-09-21 NOTE — Progress Notes (Signed)
  Reason for the Medication: The patient, without the benefit of the specific treatment measure, is incapable of participating in any available treatment plan that will give the patient a realistic opportunity of improving the patient's condition.   Patient presents disorganized and psychotic guarded seems paranoid refusing to answer questions keeps repeating "I am sleeping" occasionally he is laughing to himself and acting bizarre during evaluation answering some questions in a very limited manner.  He seems to have very poor insight to his mental illness and need for medication treatment.  Consideration of Side Effects: Consideration of the side effects related to the medication plan has been given.   Rationale for Medication Administration: Patient has shown improvement in past hospitalizations with medication management.  He decompensates after discharge when he becomes medication compliant.    -----   At the present time, patient has no capacity to understand the need for treatment, refusing to take psychotropic medication, because of poor insight and judgment.  Patient states that they have no problem, no mental illness and refuses to consider taking medication, recommended by the psychiatrist Dr. Caswell Corwin.  Diagnosis: Schizoaffective disorder bipolar type   Treatment plan/Opinion: -patient is mentally ill, as needs hospitalization -patient continues to display active symptoms of psychosis -patient lacks the capacity to consent to treatment with medication   -patient is unable to rationally discuss medication options, risks versus benefit due to their symptoms. -treatment with medication and medication changes would be in their best interest -psychotropics are effective treatment for symptoms of psychosis   Based on my evaluation, the following medications are recommend and indicated for treatment of the patient's psychiatric diagnosis and symptoms: First or second generation  antipsychotic p.o. or IM if refusing p.o.

## 2022-09-21 NOTE — Progress Notes (Signed)
   09/21/22 0529  15 Minute Checks  Location Bedroom  Visual Appearance Calm  Behavior Sleeping  Sleep (Behavioral Health Patients Only)  Calculate sleep? (Click Yes once per 24 hr at 0600 safety check) Yes  Documented sleep last 24 hours 1.5

## 2022-09-21 NOTE — Progress Notes (Signed)
   09/21/22 1157  Psych Admission Type (Psych Patients Only)  Admission Status Involuntary  Psychosocial Assessment  Patient Complaints Other (Comment) (UTA)  Eye Contact Avoids  Affect UTA  Speech Elective mutism  Interaction Avoidant;Guarded  Motor Activity Other (Comment) (WDL)  Appearance/Hygiene Unremarkable  Behavior Characteristics Unwilling to participate;Agitated;Guarded;Irritable;Resistant to care  Mood Suspicious;Labile  Thought Process  Coherency Unable to assess  Content UTA  Delusions UTA  Perception UTA  Hallucination UTA  Judgment UTA  Confusion UTA  Danger to Self  Current suicidal ideation? Denies  Danger to Others  Danger to Others None reported or observed

## 2022-09-21 NOTE — BHH Suicide Risk Assessment (Signed)
[facility] Admission Suicide Risk Assessment   Nursing information obtained from:    Demographic factors:    Current Mental Status:    Loss Factors:    Historical Factors:    Risk Reduction Factors:     Total Time spent with patient: 15 minutes Principal Problem: Schizoaffective disorder, bipolar type ([place]) Diagnosis:  Principal Problem:   Schizoaffective disorder, bipolar type ([place])  Subjective Data: See H&P   Continued Clinical Symptoms:    The "Alcohol Use Disorders Identification Test", Guidelines for Use in Primary Care, Second Edition.  World Pharmacologist [facility]). Score between 0-7:  no or low risk or alcohol related problems. Score between 8-15:  moderate risk of alcohol related problems. Score between 16-19:  high risk of alcohol related problems. Score 20 or above:  warrants further diagnostic evaluation for alcohol dependence and treatment.   CLINICAL FACTORS:   Schizophrenia:   Paranoid or undifferentiated type    Psychiatric Specialty Exam:  Presentation  General Appearance:  Bizarre; Disheveled  Eye Contact: None  Speech: Normal Rate  Speech Volume: Normal  Handedness: -- (uncooperative, uta)   Mood and Affect  Mood: Irritable  Affect: Congruent   Thought Process  Thought Processes: Disorganized  Descriptions of Associations:Intact  Orientation:-- (uta)  Thought Content:Illogical  History of Schizophrenia/Schizoaffective disorder:Yes  Duration of Psychotic Symptoms:Less than six months  Hallucinations:Hallucinations: -- (rtis but denies AH, VH)  Ideas of Reference:-- (denies but supsicious, guarded, appearing paranoid)  Suicidal Thoughts:Suicidal Thoughts: -- Pincus Badder)  Homicidal Thoughts:Homicidal Thoughts: -- Pincus Badder)   Sensorium  Memory: -- Pincus Badder)  Judgment: Impaired  Insight: Lacking   Executive Functions  Concentration: Other (comment) (uncooperative, uta)  Attention Span: Poor  Recall: Other (comment)  (uncooperative, uta)  Fund of Knowledge: Other (comment) (uncooperative, uta)  Language: Other (comment) (uncooperative, uta)   Psychomotor Activity  Psychomotor Activity: Psychomotor Activity: -- Pincus Badder)   Assets  Assets: Financial Resources/Insurance   Sleep  Sleep: Sleep: -- Pincus Badder)    Physical Exam: Physical Exam See H&P  ROS See H&P  Blood pressure 128/80, pulse 90, temperature 98.3 F (36.8 C), temperature source Oral, resp. rate 16, weight 90.7 kg, SpO2 100 %. Body mass index is 36.58 kg/m.   COGNITIVE FEATURES THAT CONTRIBUTE TO RISK:  Closed-mindedness    SUICIDE RISK:   Mild:  There are no identifiable suicide plans, no associated intent, mild dysphoria and related symptoms, good self-control (both objective and subjective assessment), few other risk factors, and identifiable protective factors, including available and accessible social support.   PLAN OF CARE: See H&P   I certify that inpatient services furnished can reasonably be expected to improve the patient's condition.   Christoper Allegra, MD 09/21/2022, 11:06 AM

## 2022-09-21 NOTE — Plan of Care (Signed)
  Problem: Education: Goal: Emotional status will improve Outcome: Not Progressing Goal: Mental status will improve Outcome: Not Progressing Goal: Verbalization of understanding the information provided will improve Outcome: Not Progressing   Problem: Education: Goal: Will be free of psychotic symptoms Outcome: Not Progressing Goal: Knowledge of the prescribed therapeutic regimen will improve Outcome: Not Progressing   Problem: Health Behavior/Discharge Planning: Goal: Compliance with prescribed medication regimen will improve Outcome: Not Progressing

## 2022-09-21 NOTE — Group Note (Signed)
Date:  09/21/2022 Time:  10:47 AM  Group Topic/Focus:  Goals Group:   The focus of this group is to help patients establish daily goals to achieve during treatment and discuss how the patient can incorporate goal setting into their daily lives to aide in recovery.    Participation Level:  Did Not Attend   Iona Coach Yuniel Blaney [id number], 10:47 AM

## 2022-09-21 NOTE — Progress Notes (Addendum)
D- Patient alert and oriented. Patient refused assessment questions. Patient agitated and verbally aggressive towards staff.  A- Scheduled medications administered to patient, per MAR. Patient declined PO, but accepted IM. Support and encouragement provided.  Routine safety checks conducted every 15 minutes.  Patient informed to notify staff with problems or concerns.  R- No adverse drug reactions noted. Patient remains safe at this time.

## 2022-09-21 NOTE — BHH Counselor (Signed)
BHH/BMU LCSW Progress Note   09/21/2022    9:57 AM  Justin Chaney   [id number]   Type of Contact and Topic: Assessment attempt  CSW met with patient in the presence of provider, Dr. Caswell Corwin. Patient refused to participate in assessment.  CSW will attempt assessment tomorrow.      Signed:  Riki Altes MSW, LCSW, LCAS 09/21/2022 9:57 AM

## 2022-09-21 NOTE — Progress Notes (Signed)
I called - [name] (Hardy legal guardian), [phone] -  Did not answer, I left a VM to call back to discuss hospital course, diagnosis, and treatment plan of patient.

## 2022-09-21 NOTE — Group Note (Signed)
Recreation Therapy Group Note   Group Topic:Other  Group Date: 09/21/2022 Start Time: 1005 End Time: [id number] Facilitators: Jujuan Dugo-McCall, LRT,CTRS Location: [address] Day[address]   Goal Area(s) Addresses:  Patient will use problem solving skills to complete the activity.  Patient will engage with patients in an appropriate manner.  Group Description:  Cognitive Challenge.  Patients were given worksheets to decode brain teasers that challenge them.  Patients were to use their creativity and problem solving skills to unlock the meaning of each puzzle.    Affect/Mood: N/A   Participation Level: Did not attend    Clinical Observations/Individualized Feedback:     Plan: Continue to engage patient in RT group sessions 2-3x/week.   Ahnesti Townsend-McCall, LRT,CTRS  09/21/2022 12:05 PM

## 2022-09-22 ENCOUNTER — Encounter (HOSPITAL_COMMUNITY): Payer: Self-pay

## 2022-09-22 MED ORDER — DIVALPROEX SODIUM 500 MG PO DR TAB
500.0000 mg | DELAYED_RELEASE_TABLET | Freq: Two times a day (BID) | ORAL | Status: DC
  Filled 2022-09-22 (×8): qty 1

## 2022-09-22 NOTE — Group Note (Signed)
Date:  09/22/2022 Time:  2:54 PM  Group Topic/Focus:  Building Self Esteem:   The Focus of this group is helping patients become aware of the effects of self-esteem on their lives, the things they and others do that enhance or undermine their self-esteem, seeing the relationship between their level of self-esteem and the choices they make and learning ways to enhance self-esteem.    Participation Level:  Did Not Attend   Iona Coach Aquilla Shambley [id number], 2:54 PM

## 2022-09-22 NOTE — Progress Notes (Signed)
   09/22/22 0542  15 Minute Checks  Location Bedroom  Visual Appearance Calm  Behavior Sleeping  Sleep (Behavioral Health Patients Only)  Calculate sleep? (Click Yes once per 24 hr at 0600 safety check) Yes  Documented sleep last 24 hours 9

## 2022-09-22 NOTE — BHH Counselor (Signed)
BHH/BMU LCSW Progress Note   09/22/2022    11:27 AM  Justin Chaney   [id number]   Type of Contact and Topic:  Assessment Attempt  CSW attempted to complete assessment.  He was irritable and just received injection and unwilling to participate in assessment.  CSW will attempt again tomorrow and will need to do a summary if unable to complete.     Signed:  Riki Altes MSW, LCSW, LCAS 09/22/2022 11:27 AM

## 2022-09-22 NOTE — Progress Notes (Signed)
D- Patient alert. Patient posturing and using aggressive language towards staff. "Get out of my face, man. I'm not taking any goddamn medication. You're not going to give me any fucking shots." Patient approached staff with clenched fists, but was able to be verbally redirected. Patient refused assessment questions.   A- Patient accepted PO haldol, cogentin, and hydroxyzine but refused PO depakote. "I don't take fucking depakote. I don't care what the doctor says. I don't take that shit." Support and encouragement provided.  Routine safety checks conducted every 15 minutes.  Patient informed to notify staff with problems or concerns.  R- No adverse drug reactions noted. Patient remains safe at this time.

## 2022-09-22 NOTE — BHH Group Notes (Signed)
[facility] Group Notes:  (Nursing/MHT/Case Management/Adjunct)  Date:  09/22/2022  Time:  8:45 PM  Type of Therapy:   Wrap Up Group  Participation Level:  Did Not Attend  Participation Quality:   NA  Affect:   NA  Cognitive:   NA  Insight:  None  Engagement in Group:   NA  Modes of Intervention:   NA  Summary of Progress/Problems:  Pt didn't attend group.  Orvan Falconer 09/22/2022, 8:45 PM

## 2022-09-22 NOTE — Group Note (Signed)
Recreation Therapy Group Note   Group Topic:Health and Wellness  Group Date: 09/22/2022 Start Time: 1000 End Time: 1100 Facilitators: Kyiesha Millward-McCall, LRT,CTRS Location: [address] Dayroom   Goal Area(s) Addresses:  Patient will define components of whole wellness. Patient will verbalize benefit of whole wellness.  Group Description:  Exercise.  LRT and patients discussed the importance of exercise and it's effect on the body.  LRT informed patients they would take turns leading the group in exercises of their choosing.  Patients were also informed to be mindful of the group and not to be extreme with their exercise choices.  LRT explained they were to go at least 30 minutes and to get water/take breaks as needed.   Affect/Mood: N/A   Participation Level: Did not attend    Clinical Observations/Individualized Feedback:     Plan: Continue to engage patient in RT group sessions 2-3x/week.   Huriel Matt-McCall, LRT,CTRS 09/22/2022 12:11 PM

## 2022-09-22 NOTE — Progress Notes (Signed)
Pt observed with blunted affect, increased agitation, threatening posture towards staff, pressured, disorganized and abusive speech when approached on 3 separate attempts with his scheduled morning medications. Per pt "I'm not taking any medicines nigger, you're a pussy, an ass-hole, you can't do shit to me. I'm not taking that. I'm not no transgender, mother fucker, I'm a man, a boy with some balls". Pt is suspicious, hypervigilant, paranoid and labile on assessment. Appears to be Refused his all medications PO, received agitation protocol (see EMAR) via IM route at 0951 with show of support; due to increased agitation, not being redirectable at the time. Safety checks maintained at Q 15 minutes intervals. Support, encouragement and reassurance offered. Pt asleep when reassessed at 1050, respirations noted and unlabored. Tolerated lunch and fluids well. Safety maintained in milieu.

## 2022-09-22 NOTE — Plan of Care (Signed)
  Problem: Education: Goal: Emotional status will improve Outcome: Not Progressing Goal: Mental status will improve Outcome: Not Progressing   Problem: Coping: Goal: Ability to verbalize frustrations and anger appropriately will improve Outcome: Not Progressing   Problem: Education: Goal: Will be free of psychotic symptoms Outcome: Not Progressing

## 2022-09-22 NOTE — Group Note (Signed)
Date:  09/22/2022 Time:  9:53 AM  Group Topic/Focus:  Goals Group:   The focus of this group is to help patients establish daily goals to achieve during treatment and discuss how the patient can incorporate goal setting into their daily lives to aide in recovery.    Participation Level:  Did Not Attend    Iona Coach Deiontae Rabel [id number], 9:53 AM

## 2022-09-22 NOTE — Progress Notes (Signed)
[facility] MD Progress Note  09/22/2022 11:16 AM Justin Chaney  MRN:  [id number]  Subjective:     Patient is a 27 year old male with a documented psychiatric history of schizoaffective disorder bipolar type, presented to the [facility] under IVC with police escort.     Yesterday the psychiatry team recommended: -Start forced medication order with Haldol 10 mg bid oral or 5 mg bid IM if refuses oral -start cogentin 0.5 mg bid   On my exam today, patient refused to participate in the exam while I was in his room.  He refused to come out any psychiatric symptoms including psychosis, mood, SI, HI.  He refused, and that he was having side effects to medications.  He was encouraged to take oral medications.  He did not respond when I asked him why he was not taking oral medications.  I also encouraged him to let the nursing do an EKG, so we can monitor his leisure activity in his heart, as psychiatric medications can affect at this -he did not respond when encouraged and educated as to the rationale behind this getting an EKG.  Almost as soon as I left the room after the patient did not participate in the interview, the patient had the room, was demanding and irritable with staff, posturing, demanding to be discharged.  Later in the morning, I saw him, still irritable posturing with staff.  He is verbally aggressive with staff, including saying aggressive comments and racial slurs.  Nursing staff administered PRN IM medication (Haldol, Ativan, Benadryl) this morning for agitation in place of the gradual IM backup for forced medications, and I gave approval for this.  Last night, the patient refused oral medication and was given the IM forced medication order as a backup for oral refusal.  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   Depression    Homelessness    Schizophrenia ([place])    Seizures ([place])    childhood seizures   Suicidal behavior    Suicidal intent     Past Surgical History:  Procedure Laterality Date   APPENDECTOMY     SHUNT REMOVAL     TEE WITHOUT CARDIOVERSION N/A 08/14/2019   Procedure: TRANSESOPHAGEAL ECHOCARDIOGRAM (TEE);  Surgeon: Pixie Casino, MD;  Location: [facility] ENDOSCOPY;  Service: Cardiovascular;  Laterality: N/A;   TONSILLECTOMY     VENTRICULO-PERITONEAL SHUNT PLACEMENT / LAPAROSCOPIC INSERTION PERITONEAL CATHETER     Family History:  Family History  Problem Relation Age of Onset   Hypertension Mother    Mental illness Neg Hx    Family Psychiatric  History:  Patient refused to discuss all aspects of this    Social History:  Social History   Substance and Sexual Activity  Alcohol Use No     Social History   Substance and Sexual Activity  Drug Use Yes   Types: Other-see comments, Amphetamines    Social History   Socioeconomic History   Marital status: Single    Spouse name: Not on file   Number of children: Not on file   Years of education: Not on file   Highest education  level: Not on file  Occupational History   Not on file  Tobacco Use   Smoking status: Every Day    Packs/day: 0.00    Types: Cigars, Cigarettes   Smokeless tobacco: Never  Vaping Use   Vaping Use: Never used  Substance and Sexual Activity   Alcohol use: No   Drug use: Yes    Types: Other-see comments, Amphetamines   Sexual activity: Not on file  Other Topics Concern   Not on file  Social History Narrative   Not on file   Social Determinants of Health   Financial Resource Strain: Not on file  Food Insecurity:  Not on file  Transportation Needs: Not on file  Physical Activity: Not on file  Stress: Not on file  Social Connections: Not on file   Additional Social History:                         Sleep: Fair  Appetite: Unsure  Current Medications: Current Facility-Administered Medications  Medication Dose Route Frequency Provider Last Rate Last Admin   acetaminophen (TYLENOL) tablet 650 mg  650 mg Oral Q6H PRN Derrill Center, NP       alum & mag hydroxide-simeth (MAALOX/MYLANTA) 200-200-20 MG/5ML suspension 30 mL  30 mL Oral Q4H PRN Derrill Center, NP       benztropine (COGENTIN) tablet 0.5 mg  0.5 mg Oral Q12H Adison Jerger, MD       Or   benztropine mesylate (COGENTIN) injection 0.5 mg  0.5 mg Intramuscular Q12H Terren Jandreau, Ovid Curd, MD   0.5 mg at 09/21/22 1948   benztropine mesylate (COGENTIN) injection 2 mg  2 mg Intramuscular BID PRN Bertram Haddix, Ovid Curd, MD       haloperidol (HALDOL) tablet 5 mg  5 mg Oral TID PRN Janine Limbo, MD       And   LORazepam (ATIVAN) tablet 2 mg  2 mg Oral TID PRN Janine Limbo, MD       And   diphenhydrAMINE (BENADRYL) capsule 50 mg  50 mg Oral TID PRN Macon Lesesne, Ovid Curd, MD       haloperidol lactate (HALDOL) injection 5 mg  5 mg Intramuscular TID PRN Janine Limbo, MD   5 mg at 09/22/22 1017   And   LORazepam (ATIVAN) injection 2 mg  2 mg Intramuscular TID PRN Janine Limbo, MD   2 mg at 09/22/22 1016   And   diphenhydrAMINE (BENADRYL) injection 50 mg  50 mg Intramuscular TID PRN Janine Limbo, MD   50 mg at 09/22/22 1016   divalproex (DEPAKOTE) DR tablet 500 mg  500 mg Oral Q12H Kasin Tonkinson, MD       haloperidol (HALDOL) tablet 10 mg  10 mg Oral Q12H Brynnlee Cumpian, MD       Or   haloperidol lactate (HALDOL) injection 5 mg  5 mg Intramuscular Q12H Venetia Prewitt, Ovid Curd, MD   5 mg at 09/21/22 1948   hydrOXYzine (ATARAX) tablet 25 mg  25 mg Oral TID PRN Aneli Zara, Ovid Curd, MD       magnesium hydroxide (MILK  OF MAGNESIA) suspension 30 mL  30 mL Oral Daily PRN Derrill Center, NP       nicotine (NICODERM CQ - dosed in mg/24 hours) patch 14 mg  14 mg Transdermal Daily PRN Minh Jasper, Ovid Curd, MD       traZODone (DESYREL) tablet 50 mg  50 mg Oral QHS PRN Derrill Center, NP  Lab Results: No results found for this or any previous visit (from the past 48 hour(s)).  Blood Alcohol level:  Lab Results  Component Value Date   ETH <10 09/18/2022   ETH <10 [id number]    Metabolic Disorder Labs: Lab Results  Component Value Date   HGBA1C 3.9 (L) 09/18/2022   MPG 65.23 09/18/2022   MPG 97 12/22/2016   Lab Results  Component Value Date   PROLACTIN 2.4 (L) 12/22/2016   PROLACTIN 8.0 09/09/2015   Lab Results  Component Value Date   CHOL 113 09/18/2022   TRIG 112 09/18/2022   HDL 46 09/18/2022   CHOLHDL 2.5 09/18/2022   VLDL 22 09/18/2022   LDLCALC 45 09/18/2022   LDLCALC 56 12/22/2016    Physical Findings: AIMS:  , ,  ,  ,    CIWA:    COWS:     Musculoskeletal: Strength & Muscle Tone: within normal limits Gait & Station: normal Patient leans: N/A  Patient refuses physical exam today for testing any tremor, cogwheeling, rigidity.  Psychiatric Specialty Exam:  Presentation  General Appearance:  -- (Agitated)  Eye Contact: Poor  Speech: -- (Demanding)  Speech Volume: Increased  Handedness: -- (uncooperative, uta)   Mood and Affect  Mood: Irritable  Affect: Congruent   Thought Process  Thought Processes: -- (Tangential)  Descriptions of Associations:Tangential  Orientation:Partial  Thought Content:Tangential  History of Schizophrenia/Schizoaffective disorder:Yes  Duration of Psychotic Symptoms:Less than six months  Hallucinations:Hallucinations: -- (Patient does not participate in the interview this morning when asked if he was having any hallucinations.  He is actively responding to internal stimuli.)  Ideas of Reference:-- (Paranoid  appearing)  Suicidal Thoughts:Suicidal Thoughts: -- (Refuses to answer)  Homicidal Thoughts:Homicidal Thoughts: -- (Refuses to answer)   Sensorium  Memory: -- (Refuses to answer)  Judgment: Poor  Insight: Lacking   Executive Functions  Concentration: Poor  Attention Span: Poor  Recall: Other (comment) (uncooperative, uta)  Fund of Knowledge: Other (comment) (uncooperative, uta)  Language: Other (comment) (uncooperative, uta)   Psychomotor Activity  Psychomotor Activity: Psychomotor Activity: Increased   Assets  Assets: Financial Resources/Insurance   Sleep  Sleep: Sleep: Fair    Physical Exam: Physical Exam Vitals reviewed.  Neurological:     Mental Status: He is alert.     Motor: No weakness.     Gait: Gait normal.    Review of Systems  Reason unable to perform ROS: Patient agitated, not participating in the exam.  Psychiatric/Behavioral:  Positive for hallucinations. The patient is nervous/anxious.    Blood pressure 131/74, pulse 93, temperature 98.4 F (36.9 C), temperature source Oral, resp. rate 18, weight 90.7 kg, SpO2 99 %. Body mass index is 36.58 kg/m.   Treatment Plan Summary:   **Patient has a legal guardian**   PLAN: Safety and Monitoring:             -- Involuntary admission to inpatient psychiatric unit for safety, stabilization and treatment             -- Daily contact with patient to assess and evaluate symptoms and progress in treatment             -- Patient's case to be discussed in multi-disciplinary team meeting             -- Observation Level : q15 minute checks             -- Vital signs:  q12 hours             --  Precautions: suicide, elopement, and assault   2. Psychiatric Diagnoses and Treatment:               -Continue Haldol 10 mg every 12 hours for psychotic disorder.  This is a forced medication.  Second opinion evaluation on 09-21-22, valid for 7 days. Ordered EKG on admission and reminded nursing to  attempt to get this as soon it is safe to do this.  Last EKG was in 2021 -*No QTc prolongation.  -Continue Cogentin 0.5 mg every 12 hours for EPS prophylaxis   -Continue Haldol oral and intramuscular PRN for agitation (combined with Ativan and Benadryl)     --  The risks/benefits/side-effects/alternatives to this medication were discussed in detail with the patient and time was given for questions. The patient consents to medication trial.                -- Metabolic profile and EKG monitoring obtained while on an atypical antipsychotic (BMI: Lipid Panel: HbgA1c: QTc:)              -- Encouraged patient to participate in unit milieu and in scheduled group therapies                     3. Medical Issues Being Addressed:              Tobacco Use Disorder             -- Nicotine patch 14 mg/24 hours ordered             -- Smoking cessation encouraged   4. Discharge Planning:              -- Social work and case management to assist with discharge planning and identification of hospital follow-up needs prior to discharge             -- Estimated LOS: 5-7 days             -- Discharge Concerns: Need to establish a safety plan; Medication compliance and effectiveness             -- Discharge Goals: Return home with outpatient referrals for mental health follow-up including medication management/psychotherapy    Christoper Allegra, MD 09/22/2022, 11:16 AM  Total Time Spent in Direct Patient Care:  I personally spent 35 minutes on the unit in direct patient care. The direct patient care time included face-to-face time with the patient, reviewing the patient's chart, communicating with other professionals, and coordinating care. Greater than 50% of this time was spent in counseling or coordinating care with the patient regarding goals of hospitalization, psycho-education, and discharge planning needs.   Janine Limbo, MD Psychiatrist

## 2022-09-22 NOTE — BH IP Treatment Plan (Signed)
Interdisciplinary Treatment and Diagnostic Plan Update  09/22/2022 Time of Session: 9:45am  Justin Chaney MRN: [id number]  Principal Diagnosis: Schizoaffective disorder, bipolar type ([place])  Secondary Diagnoses: Principal Problem:   Schizoaffective disorder, bipolar type ([place])   Current Medications:  Current Facility-Administered Medications  Medication Dose Route Frequency Provider Last Rate Last Admin   acetaminophen (TYLENOL) tablet 650 mg  650 mg Oral Q6H PRN Derrill Center, NP       alum & mag hydroxide-simeth (MAALOX/MYLANTA) 200-200-20 MG/5ML suspension 30 mL  30 mL Oral Q4H PRN Derrill Center, NP       benztropine (COGENTIN) tablet 0.5 mg  0.5 mg Oral Q12H Massengill, Nathan, MD       Or   benztropine mesylate (COGENTIN) injection 0.5 mg  0.5 mg Intramuscular Q12H Massengill, Ovid Curd, MD   0.5 mg at 09/21/22 1948   benztropine mesylate (COGENTIN) injection 2 mg  2 mg Intramuscular BID PRN Massengill, Ovid Curd, MD       haloperidol (HALDOL) tablet 5 mg  5 mg Oral TID PRN Janine Limbo, MD       And   LORazepam (ATIVAN) tablet 2 mg  2 mg Oral TID PRN Janine Limbo, MD       And   diphenhydrAMINE (BENADRYL) capsule 50 mg  50 mg Oral TID PRN Massengill, Ovid Curd, MD       haloperidol lactate (HALDOL) injection 5 mg  5 mg Intramuscular TID PRN Janine Limbo, MD   5 mg at 09/22/22 1017   And   LORazepam (ATIVAN) injection 2 mg  2 mg Intramuscular TID PRN Janine Limbo, MD   2 mg at 09/22/22 1016   And   diphenhydrAMINE (BENADRYL) injection 50 mg  50 mg Intramuscular TID PRN Janine Limbo, MD   50 mg at 09/22/22 1016   divalproex (DEPAKOTE) DR tablet 500 mg  500 mg Oral Q12H Massengill, Nathan, MD       haloperidol (HALDOL) tablet 10 mg  10 mg Oral Q12H Massengill, Nathan, MD       Or   haloperidol lactate (HALDOL) injection 5 mg  5 mg Intramuscular Q12H Massengill, Ovid Curd, MD   5 mg at 09/21/22 1948   hydrOXYzine (ATARAX) tablet 25 mg  25 mg Oral TID PRN  Massengill, Ovid Curd, MD       magnesium hydroxide (MILK OF MAGNESIA) suspension 30 mL  30 mL Oral Daily PRN Derrill Center, NP       nicotine (NICODERM CQ - dosed in mg/24 hours) patch 14 mg  14 mg Transdermal Daily PRN Massengill, Ovid Curd, MD       traZODone (DESYREL) tablet 50 mg  50 mg Oral QHS PRN Derrill Center, NP       PTA Medications: No medications prior to admission.    Patient Stressors:    Patient Strengths:    Treatment Modalities: Medication Management, Group therapy, Case management,  1 to 1 session with clinician, Psychoeducation, Recreational therapy.   Physician Treatment Plan for Primary Diagnosis: Schizoaffective disorder, bipolar type ([place]) Long Term Goal(s): Improvement in symptoms so as ready for discharge   Short Term Goals: Ability to identify changes in lifestyle to reduce recurrence of condition will improve Ability to verbalize feelings will improve Ability to disclose and discuss suicidal ideas Ability to demonstrate self-control will improve Ability to identify and develop effective coping behaviors will improve Ability to maintain clinical measurements within normal limits will improve Compliance with prescribed medications will improve Ability to identify triggers associated with  substance abuse/mental health issues will improve  Medication Management: Evaluate patient's response, side effects, and tolerance of medication regimen.  Therapeutic Interventions: 1 to 1 sessions, Unit Group sessions and Medication administration.  Evaluation of Outcomes: Not Met  Physician Treatment Plan for Secondary Diagnosis: Principal Problem:   Schizoaffective disorder, bipolar type ([place])  Long Term Goal(s): Improvement in symptoms so as ready for discharge   Short Term Goals: Ability to identify changes in lifestyle to reduce recurrence of condition will improve Ability to verbalize feelings will improve Ability to disclose and discuss suicidal ideas Ability  to demonstrate self-control will improve Ability to identify and develop effective coping behaviors will improve Ability to maintain clinical measurements within normal limits will improve Compliance with prescribed medications will improve Ability to identify triggers associated with substance abuse/mental health issues will improve     Medication Management: Evaluate patient's response, side effects, and tolerance of medication regimen.  Therapeutic Interventions: 1 to 1 sessions, Unit Group sessions and Medication administration.  Evaluation of Outcomes: Not Met   RN Treatment Plan for Primary Diagnosis: Schizoaffective disorder, bipolar type ([place]) Long Term Goal(s): Knowledge of disease and therapeutic regimen to maintain health will improve  Short Term Goals: Ability to remain free from injury will improve, Ability to participate in decision making will improve, Ability to verbalize feelings will improve, Ability to disclose and discuss suicidal ideas, and Ability to identify and develop effective coping behaviors will improve  Medication Management: RN will administer medications as ordered by provider, will assess and evaluate patient's response and provide education to patient for prescribed medication. RN will report any adverse and/or side effects to prescribing provider.  Therapeutic Interventions: 1 on 1 counseling sessions, Psychoeducation, Medication administration, Evaluate responses to treatment, Monitor vital signs and CBGs as ordered, Perform/monitor CIWA, COWS, AIMS and Fall Risk screenings as ordered, Perform wound care treatments as ordered.  Evaluation of Outcomes: Not Met   LCSW Treatment Plan for Primary Diagnosis: Schizoaffective disorder, bipolar type ([place]) Long Term Goal(s): Safe transition to appropriate next level of care at discharge, Engage patient in therapeutic group addressing interpersonal concerns.  Short Term Goals: Engage patient in aftercare planning  with referrals and resources, Increase social support, Increase emotional regulation, Facilitate acceptance of mental health diagnosis and concerns, Identify triggers associated with mental health/substance abuse issues, and Increase skills for wellness and recovery  Therapeutic Interventions: Assess for all discharge needs, 1 to 1 time with Social worker, Explore available resources and support systems, Assess for adequacy in community support network, Educate family and significant other(s) on suicide prevention, Complete Psychosocial Assessment, Interpersonal group therapy.  Evaluation of Outcomes: Not Met   Progress in Treatment: Attending groups: Yes. Participating in groups: Yes. Taking medication as prescribed: Yes. Toleration medication: Yes. Family/Significant other contact made: Yes, individual(s) contacted:  If patient consents  Patient understands diagnosis: No. Discussing patient identified problems/goals with staff: Yes. Medical problems stabilized or resolved: Yes. Denies suicidal/homicidal ideation: Yes. Issues/concerns per patient self-inventory: No.   New problem(s) identified: No, Describe:  None   New Short Term/Long Term Goal(s): medication stabilization, elimination of SI thoughts, development of comprehensive mental wellness plan.   Patient Goals: Did not attend   Discharge Plan or Barriers: Patient recently admitted. CSW will continue to follow and assess for appropriate referrals and possible discharge planning.   Reason for Continuation of Hospitalization: Anxiety Hallucinations Medication stabilization  Estimated Length of Stay: 3 to 7 days   Last 3 [place] Suicide Severity Risk Score: [employer]  ED from 08/22/2022 in [facility] Emergency Department at [facility] ED from 11/14/2021 in [facility] Emergency Department at [facility] ED from 11/09/2021 in [facility] Emergency Department at [place] No  Risk No Risk No Risk       Last [facility] 2/9 Scores:     No data to display          Scribe for Treatment Team: Carney Harder 09/22/2022 2:35 PM

## 2022-09-23 NOTE — Group Note (Signed)
Date:  09/23/2022 Time:  2:24 PM  Group Topic/Focus:  Peer Support Group    Participation Level:  Did Not Attend   Iona Coach Breckon Reeves [id number], 2:24 PM

## 2022-09-23 NOTE — Progress Notes (Signed)
   09/23/22 0616  15 Minute Checks  Location Bedroom  Visual Appearance Calm  Behavior Sleeping  Sleep (Behavioral Health Patients Only)  Calculate sleep? (Click Yes once per 24 hr at 0600 safety check) Yes  Documented sleep last 24 hours 11

## 2022-09-23 NOTE — Group Note (Signed)
Recreation Therapy Group Note   Group Topic:Coping Skills  Group Date: 09/23/2022 Start Time: [id number] End Time: 1033 Facilitators: Lamont Tant-McCall, LRT,CTRS Location: [address] Dayroom   Goal Area(s) Addresses: Patient will define what a coping skill is. Patient will successfully identify positive coping skills they can use post d/c.  Patient will acknowledge benefit(s) of using learned coping skills post d/c.  Group Description:  Coping A to Z. Patient asked to identify what a coping skill is and when they use them. Patients with Probation officer discussed healthy versus unhealthy coping skills. Next patients were given a blank worksheet titled "Coping Skills A-Z". Partners were instructed to come up with at least one positive coping skill per letter of the alphabet.  Patients were given 15 minutes to brainstorm, before ideas were presented to the large group. Patients and LRT debriefed on the importance of coping skill selection based on situation and back-up plans when a skill tried is not effective. At the end of group, patients were given an handout of alphabetized strategies to keep for future reference.   Affect/Mood: N/A   Participation Level: Did not attend    Clinical Observations/Individualized Feedback:     Plan: Continue to engage patient in RT group sessions 2-3x/week.   Calvin Jablonowski-McCall, LRT,CTRS 09/23/2022 11:23 AM

## 2022-09-23 NOTE — Group Note (Signed)
Date:  09/23/2022 Time:  4:14 PM  Group Topic/Focus:  Spirituality:   The focus of this group is to discuss how one's spirituality can aide in recovery.    Participation Level:  Active  Participation Quality:  Appropriate  Affect:  Appropriate  Cognitive:  Appropriate  Insight: Appropriate  Engagement in Group:  Engaged  Modes of Intervention:  Discussion  Additional Comments:  Patient attended Spirituality Group  Baraa Tubbs W Alize Acy [id number], 4:14 PM

## 2022-09-23 NOTE — Progress Notes (Signed)
Adult Psychoeducational Group Note  Date:  09/23/2022 Time:  8:45 PM  Group Topic/Focus:  Wrap-Up Group:   The focus of this group is to help patients review their daily goal of treatment and discuss progress on daily workbooks.  Participation Level:  Did Not Attend  Participation Quality:   did not attend  Affect:   did not attend  Cognitive:   did not attend  Insight: None  Engagement in Group:   did not attend  Modes of Intervention:   did not attend  Additional Comments:  did not attend  Debe Coder 09/23/2022, 8:45 PM

## 2022-09-23 NOTE — Group Note (Unsigned)
Date:  09/23/2022 Time:  2:34 PM  Group Topic/Focus:  Goals Group:   The focus of this group is to help patients establish daily goals to achieve during treatment and discuss how the patient can incorporate goal setting into their daily lives to aide in recovery.     Participation Level:  {BHH PARTICIPATION WO:[id number]  Participation Quality:  {BHH PARTICIPATION QUALITY:22265}  Affect:  {BHH AFFECT:22266}  Cognitive:  {BHH COGNITIVE:22267}  Insight: {BHH Insight2:20797}  Engagement in Group:  {BHH ENGAGEMENT IN BP:[id number]  Modes of Intervention:  {BHH MODES OF INTERVENTION:22269}  Additional Comments:  ***  Justin Chaney 09/23/2022, 2:34 PM

## 2022-09-23 NOTE — Group Note (Signed)
Date:  09/23/2022 Time:  11:40 AM  Group Topic/Focus:  Goals Group:   The focus of this group is to help patients establish daily goals to achieve during treatment and discuss how the patient can incorporate goal setting into their daily lives to aide in recovery.    Participation Level:  Did Not Attend   Iona Coach Zarria Towell [id number], 11:40 AM

## 2022-09-23 NOTE — Progress Notes (Signed)
Patient agitated. Slamming the doors in his bedroom, hitting the walls, yelling and cursing. Patient cursing at staff stated, "Put the damn medicine in the cup." Patient given agitation protocol orally.

## 2022-09-23 NOTE — Group Note (Signed)
Date:  09/23/2022 Time:  3:41 PM  Group Topic/Focus:  Self Care:   The focus of this group is to help patients understand the importance of self-care in order to improve or restore emotional, physical, spiritual, interpersonal, and financial health.    Participation Level:  Did Not Attend [place] activity   Iona Coach Angie Piercey [id number], 3:41 PM

## 2022-09-23 NOTE — Progress Notes (Signed)
Patient is irritable this morning., At the medication window, patient cursing at staff, refused to take his scheduled Depakote. Stated he was not taking the Haldol either. Patient was informed that he was on forced medication and that if he refused the oral medication he would receive an injection. Patient then stated, "Just put the damn pills in the fucking cup. It's not working anyway." Patient took scheduled Cogentin and Haldol.

## 2022-09-23 NOTE — BHH Counselor (Signed)
Adult Comprehensive Assessment  Patient ID: Onesimus Fousek, male   DOB: [birth year], 27 y.o.   MRN: [id number]      Summary/Recommendations:   Summary and Recommendations (to be completed by the evaluator): Aurthur is a 27 year old male who was admitted to [facility] for aggression and responding to internal stimuli.  Patient currently has a legal guardian from [place].  Collateral from guardian indicates that patient has been med non-compliant and has had a psychiatric history of  schizophrenia.  Upon attempts to assess patient, patient has been irritable and unwilling to participate in an assessment.  Information was obtained through a chart review.  Patient was living in hotel prior to admission.  He receives medicaid and disability.  Patient is not connected to outpatient follow up.  While here, [facility] can benefit from crisis stabilization, medication management, therapeutic milieu, and referrals for services.   Abbegail Matuska E Dennette Faulconer. 09/23/2022

## 2022-09-23 NOTE — Progress Notes (Signed)
[facility] MD Progress Note  09/23/2022 2:00 PM Justin Chaney  MRN:  [id number]  Subjective:     Patient is a 27 year old male with a documented psychiatric history of schizoaffective disorder bipolar type, presented to the [facility] under IVC with police escort.     Yesterday the psychiatry team recommended: -Continue forced medication order with Haldol 10 mg bid oral or 5 mg bid IM if refuses oral -Continue cogentin 0.5 mg bid   On my exam today, patient minimally participated in interview. He did state that the medication is causing him to be tired. Per nursing, he did accept oral medication for the first time this morning (was requiring IM forced meds before this). He does not respond to question regarding psychotic symptoms screening. He reports that mood is tired. Does not respond to questioning regarding suicide or homicide screening.  I again encouraged him to get an EKG, so taht we could monitor electrical activity of heart while he is prescribed haldol, due to risk of arrhythmic.    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   Depression    Homelessness    Schizophrenia ([place])    Seizures ([place])    childhood seizures   Suicidal behavior    Suicidal intent     Past Surgical History:  Procedure Laterality Date   APPENDECTOMY     SHUNT REMOVAL     TEE WITHOUT CARDIOVERSION N/A 08/14/2019   Procedure: TRANSESOPHAGEAL ECHOCARDIOGRAM (TEE);  Surgeon: Pixie Casino, MD;  Location: [facility] ENDOSCOPY;  Service: Cardiovascular;  Laterality: N/A;    TONSILLECTOMY     VENTRICULO-PERITONEAL SHUNT PLACEMENT / LAPAROSCOPIC INSERTION PERITONEAL CATHETER     Family History:  Family History  Problem Relation Age of Onset   Hypertension Mother    Mental illness Neg Hx    Family Psychiatric  History:  Patient refused to discuss all aspects of this    Social History:  Social History   Substance and Sexual Activity  Alcohol Use No     Social History   Substance and Sexual Activity  Drug Use Yes   Types: Other-see comments, Amphetamines    Social History   Socioeconomic History   Marital status: Single    Spouse name: Not on file   Number of children: Not on file   Years of education: Not on file   Highest education level: Not on file  Occupational History   Not on file  Tobacco Use   Smoking status: Every Day    Packs/day: 0.00    Types: Cigars, Cigarettes   Smokeless tobacco: Never  Vaping Use   Vaping Use: Never used  Substance and Sexual Activity   Alcohol use: No   Drug use: Yes    Types: Other-see comments, Amphetamines   Sexual activity: Not on file  Other Topics Concern   Not on file  Social History Narrative   Not on file   Social Determinants of Health   Financial Resource Strain: Not on file  Food Insecurity: Not on file  Transportation Needs: Not on file  Physical Activity: Not on file  Stress: Not on  file  Social Connections: Not on file   Additional Social History:                         Sleep: Fair  Appetite: okay  Current Medications: Current Facility-Administered Medications  Medication Dose Route Frequency Provider Last Rate Last Admin   acetaminophen (TYLENOL) tablet 650 mg  650 mg Oral Q6H PRN Derrill Center, NP       alum & mag hydroxide-simeth (MAALOX/MYLANTA) 200-200-20 MG/5ML suspension 30 mL  30 mL Oral Q4H PRN Derrill Center, NP       benztropine (COGENTIN) tablet 0.5 mg  0.5 mg Oral Q12H Harel Repetto, MD   0.5 mg at 09/23/22 [id number]   Or   benztropine  mesylate (COGENTIN) injection 0.5 mg  0.5 mg Intramuscular Q12H Besnik Febus, Ovid Curd, MD   0.5 mg at 09/21/22 1948   benztropine mesylate (COGENTIN) injection 2 mg  2 mg Intramuscular BID PRN Xaine Sansom, Ovid Curd, MD       haloperidol (HALDOL) tablet 5 mg  5 mg Oral TID PRN Janine Limbo, MD       And   LORazepam (ATIVAN) tablet 2 mg  2 mg Oral TID PRN Janine Limbo, MD       And   diphenhydrAMINE (BENADRYL) capsule 50 mg  50 mg Oral TID PRN Manning Luna, Ovid Curd, MD       haloperidol lactate (HALDOL) injection 5 mg  5 mg Intramuscular TID PRN Janine Limbo, MD   5 mg at 09/22/22 1017   And   LORazepam (ATIVAN) injection 2 mg  2 mg Intramuscular TID PRN Janine Limbo, MD   2 mg at 09/22/22 1016   And   diphenhydrAMINE (BENADRYL) injection 50 mg  50 mg Intramuscular TID PRN Janine Limbo, MD   50 mg at 09/22/22 1016   divalproex (DEPAKOTE) DR tablet 500 mg  500 mg Oral Q12H Demetries Coia, MD       haloperidol (HALDOL) tablet 10 mg  10 mg Oral Q12H Otniel Hoe, Ovid Curd, MD   10 mg at 09/23/22 [id number]   Or   haloperidol lactate (HALDOL) injection 5 mg  5 mg Intramuscular Q12H Aoki Wedemeyer, Ovid Curd, MD   5 mg at 09/21/22 1948   hydrOXYzine (ATARAX) tablet 25 mg  25 mg Oral TID PRN Janine Limbo, MD   25 mg at 09/22/22 1935   magnesium hydroxide (MILK OF MAGNESIA) suspension 30 mL  30 mL Oral Daily PRN Derrill Center, NP       nicotine (NICODERM CQ - dosed in mg/24 hours) patch 14 mg  14 mg Transdermal Daily PRN Karine Garn, Ovid Curd, MD       traZODone (DESYREL) tablet 50 mg  50 mg Oral QHS PRN Derrill Center, NP        Lab Results: No results found for this or any previous visit (from the past 73 hour(s)).  Blood Alcohol level:  Lab Results  Component Value Date   ETH <10 09/18/2022   ETH <10 [id number]    Metabolic Disorder Labs: Lab Results  Component Value Date   HGBA1C 3.9 (L) 09/18/2022   MPG 65.23 09/18/2022   MPG 97 12/22/2016   Lab Results  Component  Value Date   PROLACTIN 2.4 (L) 12/22/2016   PROLACTIN 8.0 09/09/2015   Lab Results  Component Value Date   CHOL 113 09/18/2022   TRIG 112 09/18/2022   HDL 46 09/18/2022   CHOLHDL 2.5 09/18/2022   VLDL 22 09/18/2022  LDLCALC 45 09/18/2022   LDLCALC 56 12/22/2016    Physical Findings: AIMS: Facial and Oral Movements Muscles of Facial Expression: None, normal Lips and Perioral Area: None, normal Jaw: None, normal Tongue: None, normal,Extremity Movements Upper (arms, wrists, hands, fingers): None, normal Lower (legs, knees, ankles, toes): None, normal, Trunk Movements Neck, shoulders, hips: None, normal, Overall Severity Severity of abnormal movements (highest score from questions above): None, normal Incapacitation due to abnormal movements: None, normal Patient's awareness of abnormal movements (rate only patient's report): No Awareness, Dental Status Current problems with teeth and/or dentures?: No Does patient usually wear dentures?: No  CIWA:    COWS:     Musculoskeletal: Strength & Muscle Tone: within normal limits Gait & Station: normal Patient leans: N/A  Patient refuses physical exam today for testing any tremor, cogwheeling, rigidity.  Psychiatric Specialty Exam:  Presentation  General Appearance:  -- laying in bed  Eye Contact: Poor  Speech: --soft  Speech Volume: sofr  Handedness: -- (uncooperative, uta)   Mood and Affect  Mood: "Tired"  Affect: Congruent   Thought Process  Thought Processes: -- (Tangential)  Descriptions of Associations:Tangential  Orientation:Partial  Thought Content:Tangential  History of Schizophrenia/Schizoaffective disorder:Yes  Duration of Psychotic Symptoms:Less than six months  Hallucinations:Hallucinations: -- (Patient does not participate in the interview this morning when asked if he was having any hallucinations.  He is actively responding to internal stimuli.)  Ideas of Reference:-- (Paranoid  appearing)  Suicidal Thoughts:Suicidal Thoughts: -- (Refuses to answer)  Homicidal Thoughts:Homicidal Thoughts: -- (Refuses to answer)   Sensorium  Memory: -- (Refuses to answer)  Judgment: Poor  Insight: Lacking   Executive Functions  Concentration: Poor  Attention Span: Poor  Recall: Other (comment) (uncooperative, uta)  Fund of Knowledge: Other (comment) (uncooperative, uta)  Language: Other (comment) (uncooperative, uta)   Psychomotor Activity  Psychomotor Activity: Psychomotor Activity: Increased   Assets  Assets: Financial Resources/Insurance   Sleep  Sleep: Sleep: Fair    Physical Exam: Physical Exam Vitals reviewed.  Neurological:     Mental Status: He is alert.     Motor: No weakness.     Gait: Gait normal.    Review of Systems  Reason unable to perform ROS: Patient agitated, not participating in the exam.  Psychiatric/Behavioral:  Positive for hallucinations. The patient is nervous/anxious.    Blood pressure 131/74, pulse 93, temperature 98.4 F (36.9 C), temperature source Oral, resp. rate 18, weight 90.7 kg, SpO2 99 %. Body mass index is 36.58 kg/m.   Treatment Plan Summary:   **Patient has a legal guardian**   PLAN: Safety and Monitoring:             -- Involuntary admission to inpatient psychiatric unit for safety, stabilization and treatment             -- Daily contact with patient to assess and evaluate symptoms and progress in treatment             -- Patient's case to be discussed in multi-disciplinary team meeting             -- Observation Level : q15 minute checks             -- Vital signs:  q12 hours             -- Precautions: suicide, elopement, and assault   2. Psychiatric Diagnoses and Treatment:               -Continue Haldol 10 mg every 12  hours for psychotic disorder.  This is a forced medication.  Second opinion evaluation on 09-21-22, valid for 7 days. Ordered EKG on admission and reminded nursing to  attempt to get this as soon it is safe to do this.  Last EKG was in 2021 -*No QTc prolongation.  -Continue Cogentin 0.5 mg every 12 hours for EPS prophylaxis   -Continue Haldol oral and intramuscular PRN for agitation (combined with Ativan and Benadryl)     --  The risks/benefits/side-effects/alternatives to this medication were discussed in detail with the patient and time was given for questions. The patient consents to medication trial.                -- Metabolic profile and EKG monitoring obtained while on an atypical antipsychotic (BMI: Lipid Panel: HbgA1c: QTc:)              -- Encouraged patient to participate in unit milieu and in scheduled group therapies                     3. Medical Issues Being Addressed:              Tobacco Use Disorder             -- Nicotine patch 14 mg/24 hours ordered             -- Smoking cessation encouraged   4. Discharge Planning:              -- Social work and case management to assist with discharge planning and identification of hospital follow-up needs prior to discharge             -- Estimated LOS: 5-7 days             -- Discharge Concerns: Need to establish a safety plan; Medication compliance and effectiveness             -- Discharge Goals: Return home with outpatient referrals for mental health follow-up including medication management/psychotherapy    Christoper Allegra, MD 09/23/2022, 2:00 PM  Total Time Spent in Direct Patient Care:  I personally spent 35 minutes on the unit in direct patient care. The direct patient care time included face-to-face time with the patient, reviewing the patient's chart, communicating with other professionals, and coordinating care. Greater than 50% of this time was spent in counseling or coordinating care with the patient regarding goals of hospitalization, psycho-education, and discharge planning needs.   Janine Limbo, MD Psychiatrist

## 2022-09-24 MED ORDER — HALOPERIDOL LACTATE 2 MG/ML PO CONC
10.0000 mg | Freq: Two times a day (BID) | ORAL | Status: DC
  Administered 2022-09-24 – 2022-10-01 (×13): 10 mg via ORAL
  Filled 2022-09-24 (×20): qty 5

## 2022-09-24 MED ORDER — VALPROIC ACID 250 MG/5ML PO SOLN
500.0000 mg | Freq: Two times a day (BID) | ORAL | Status: DC
  Administered 2022-09-24 – 2022-10-01 (×12): 500 mg via ORAL
  Filled 2022-09-24 (×17): qty 10

## 2022-09-24 MED ORDER — HALOPERIDOL LACTATE 5 MG/ML IJ SOLN
5.0000 mg | Freq: Two times a day (BID) | INTRAMUSCULAR | Status: DC
  Administered 2022-09-24 – 2022-09-26 (×2): 5 mg via INTRAMUSCULAR
  Filled 2022-09-24 (×16): qty 1

## 2022-09-24 NOTE — Progress Notes (Signed)
[facility] MD Progress Note  09/24/2022 11:20 AM Justin Chaney  MRN:  [id number]  Subjective:     Patient is a 27 year old male with a documented psychiatric history of schizoaffective disorder bipolar type, presented to the [facility] under IVC with police escort.     Yesterday the psychiatry team recommended: -Continue forced medication order with Haldol 10 mg bid oral or 5 mg bid IM if refuses oral -Continue cogentin 0.5 mg bid  -Start Depakote dr 500 mg bid   On my exam today, patient minimally participated in interview. He asked for discharge. I discussed that we must be able to discuss his symptoms, treatment and response to treatment before we can start discussing the discharge process; to which he responed "get out of my face you're full of bull shit." He did not answer any further questions regarding mood, sleep, s/e to meds, SI, HI, psychotic symptoms screening.   I again encouraged him to get an EKG, so taht we could monitor electrical activity of heart while he is prescribed haldol, due to risk of arrhythmic.    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   Depression    Homelessness    Schizophrenia ([place])    Seizures ([place])    childhood seizures   Suicidal behavior    Suicidal intent     Past Surgical History:  Procedure Laterality Date   APPENDECTOMY     SHUNT REMOVAL     TEE WITHOUT CARDIOVERSION N/A 08/14/2019   Procedure: TRANSESOPHAGEAL ECHOCARDIOGRAM (TEE);  Surgeon: Pixie Casino, MD;  Location: [facility] ENDOSCOPY;  Service:  Cardiovascular;  Laterality: N/A;   TONSILLECTOMY     VENTRICULO-PERITONEAL SHUNT PLACEMENT / LAPAROSCOPIC INSERTION PERITONEAL CATHETER     Family History:  Family History  Problem Relation Age of Onset   Hypertension Mother    Mental illness Neg Hx    Family Psychiatric  History:  Patient refused to discuss all aspects of this    Social History:  Social History   Substance and Sexual Activity  Alcohol Use No     Social History   Substance and Sexual Activity  Drug Use Yes   Types: Other-see comments, Amphetamines    Social History   Socioeconomic History   Marital status: Single    Spouse name: Not on file   Number of children: Not on file   Years of education: Not on file   Highest education level: Not on file  Occupational History   Not on file  Tobacco Use   Smoking status: Every Day    Packs/day: 0.00    Types: Cigars, Cigarettes   Smokeless tobacco: Never  Vaping Use   Vaping Use: Never used  Substance and Sexual Activity   Alcohol use: No   Drug use: Yes    Types: Other-see comments, Amphetamines   Sexual activity: Not on file  Other Topics Concern   Not on file  Social History Narrative   Not on file   Social Determinants of Health   Financial Resource Strain: Not on file  Food Insecurity: Not on file  Transportation Needs: Not on file  Physical Activity: Not on file  Stress: Not on file  Social Connections: Not on file   Additional Social History:                         Sleep: Fair  Appetite: okay  Current Medications: Current Facility-Administered Medications  Medication Dose Route Frequency Provider Last Rate Last Admin   acetaminophen (TYLENOL) tablet 650 mg  650 mg Oral Q6H PRN Derrill Center, NP       alum & mag hydroxide-simeth (MAALOX/MYLANTA) 200-200-20 MG/5ML suspension 30 mL  30 mL Oral Q4H PRN Derrill Center, NP       benztropine (COGENTIN) tablet 0.5 mg  0.5 mg Oral Q12H Terell Kincy, Ovid Curd, MD   0.5 mg at  09/24/22 [id number]   Or   benztropine mesylate (COGENTIN) injection 0.5 mg  0.5 mg Intramuscular Q12H Aadyn Buchheit, Ovid Curd, MD   0.5 mg at 09/21/22 1948   benztropine mesylate (COGENTIN) injection 2 mg  2 mg Intramuscular BID PRN Johnay Mano, Ovid Curd, MD       haloperidol (HALDOL) tablet 5 mg  5 mg Oral TID PRN Janine Limbo, MD   5 mg at 09/23/22 1130   And   LORazepam (ATIVAN) tablet 2 mg  2 mg Oral TID PRN Janine Limbo, MD   2 mg at 09/23/22 1130   And   diphenhydrAMINE (BENADRYL) capsule 50 mg  50 mg Oral TID PRN Janine Limbo, MD   50 mg at 09/23/22 1130   haloperidol lactate (HALDOL) injection 5 mg  5 mg Intramuscular TID PRN Janine Limbo, MD   5 mg at 09/22/22 1017   And   LORazepam (ATIVAN) injection 2 mg  2 mg Intramuscular TID PRN Janine Limbo, MD   2 mg at 09/22/22 1016   And   diphenhydrAMINE (BENADRYL) injection 50 mg  50 mg Intramuscular TID PRN Janine Limbo, MD   50 mg at 09/22/22 1016   haloperidol (HALDOL) 2 MG/ML solution 10 mg  10 mg Oral Q12H Doriann Zuch, Ovid Curd, MD   10 mg at 09/24/22 [id number]   Or   haloperidol lactate (HALDOL) injection 5 mg  5 mg Intramuscular Q12H Cypress Fanfan, Ovid Curd, MD       hydrOXYzine (ATARAX) tablet 25 mg  25 mg Oral TID PRN Janine Limbo, MD   25 mg at 09/22/22 1935   magnesium hydroxide (MILK OF MAGNESIA) suspension 30 mL  30 mL Oral Daily PRN Derrill Center, NP       nicotine (NICODERM CQ - dosed in mg/24 hours) patch 14 mg  14 mg Transdermal Daily PRN Sunita Demond, Ovid Curd, MD       traZODone (DESYREL) tablet 50 mg  50 mg Oral QHS PRN Derrill Center, NP       valproic acid (DEPAKENE) 250 MG/5ML solution 500 mg  500 mg Oral Q12H Mynor Witkop, MD        Lab Results: No results found for this or any previous visit (from the past 42 hour(s)).  Blood Alcohol level:  Lab Results  Component Value Date   [facility] <10 09/18/2022   ETH <10 [id number]    Metabolic Disorder Labs: Lab Results  Component Value Date    HGBA1C 3.9 (L) 09/18/2022   MPG 65.23 09/18/2022   MPG 97 12/22/2016   Lab Results  Component Value Date   PROLACTIN 2.4 (L) 12/22/2016   PROLACTIN 8.0 09/09/2015   Lab Results  Component Value Date   CHOL 113 09/18/2022  TRIG 112 09/18/2022   HDL 46 09/18/2022   CHOLHDL 2.5 09/18/2022   VLDL 22 09/18/2022   LDLCALC 45 09/18/2022   LDLCALC 56 12/22/2016    Physical Findings: AIMS: Facial and Oral Movements Muscles of Facial Expression: None, normal Lips and Perioral Area: None, normal Jaw: None, normal Tongue: None, normal,Extremity Movements Upper (arms, wrists, hands, fingers): None, normal Lower (legs, knees, ankles, toes): None, normal, Trunk Movements Neck, shoulders, hips: None, normal, Overall Severity Severity of abnormal movements (highest score from questions above): None, normal Incapacitation due to abnormal movements: None, normal Patient's awareness of abnormal movements (rate only patient's report): No Awareness, Dental Status Current problems with teeth and/or dentures?: No Does patient usually wear dentures?: No  CIWA:    COWS:     Musculoskeletal: Strength & Muscle Tone: within normal limits Gait & Station: normal Patient leans: N/A  Patient refuses physical exam today for testing any tremor, cogwheeling, rigidity.  Psychiatric Specialty Exam:  Presentation  General Appearance:  -- laying in bed  Eye Contact: Poor  Speech: --soft  Speech Volume: sofr  Handedness: -- (uncooperative, uta)   Mood and Affect  Mood: Irritable   Affect: Congruent   Thought Process  Thought Processes: -- (Tangential)  Descriptions of Associations:Tangential  Orientation:Partial  Thought Content:Tangential  History of Schizophrenia/Schizoaffective disorder:Yes  Duration of Psychotic Symptoms:Less than six months  Hallucinations:No data recorded Denying AH, VH. Does not appear to be RTIS today.   Ideas of Reference:-- (Paranoid  appearing) Suspicious   Suicidal Thoughts:No data recorded Did not respond to questioning of this  Homicidal Thoughts:No data recorded Did not respond to questioning of this  Sensorium  Memory: -- (Refuses to answer)  Judgment: Poor  Insight: Lacking   Executive Functions  Concentration: Poor  Attention Span: Poor  Recall: Other (comment) (uncooperative, uta)  Fund of Knowledge: Other (comment) (uncooperative, uta)  Language: Other (comment) (uncooperative, uta)   Psychomotor Activity  Psychomotor Activity: No data recorded Laying in bed   Assets  Assets: Financial Resources/Insurance   Sleep  Sleep: No data recorded Adequate    Physical Exam: Physical Exam Vitals reviewed.  Neurological:     Mental Status: He is alert.     Motor: No weakness.     Gait: Gait normal.    Review of Systems  Reason unable to perform ROS: Patient agitated, not participating in the exam.  Psychiatric/Behavioral:  Positive for hallucinations. The patient is nervous/anxious.    Blood pressure 131/74, pulse 93, temperature 98.4 F (36.9 C), temperature source Oral, resp. rate 18, weight 90.7 kg, SpO2 99 %. Body mass index is 36.58 kg/m.   Treatment Plan Summary:   **Patient has a legal guardian**   PLAN: Safety and Monitoring:             -- Involuntary admission to inpatient psychiatric unit for safety, stabilization and treatment             -- Daily contact with patient to assess and evaluate symptoms and progress in treatment             -- Patient's case to be discussed in multi-disciplinary team meeting             -- Observation Level : q15 minute checks             -- Vital signs:  q12 hours             -- Precautions: suicide, elopement, and assault   2. Psychiatric Diagnoses  and Treatment:               -Continue Haldol 10 mg every 12 hours for psychotic disorder.  This is a forced medication.  Second opinion evaluation on 09-21-22, valid for 7  days. Ordered EKG on admission and reminded nursing to attempt to get this as soon it is safe to do this.  Last EKG was in 2021 -*No QTc prolongation.  -Continue Cogentin 0.5 mg every 12 hours for EPS prophylaxis -Start Depakene liquid 500 mg q12H - for mood stability, irritability to and augment antipsychotic    -Continue Haldol oral and intramuscular PRN for agitation (combined with Ativan and Benadryl)     --  The risks/benefits/side-effects/alternatives to this medication were discussed in detail with the patient and time was given for questions. The patient consents to medication trial.                -- Metabolic profile and EKG monitoring obtained while on an atypical antipsychotic (BMI: Lipid Panel: HbgA1c: QTc:)              -- Encouraged patient to participate in unit milieu and in scheduled group therapies                     3. Medical Issues Being Addressed:              Tobacco Use Disorder             -- Nicotine patch 14 mg/24 hours ordered             -- Smoking cessation encouraged   4. Discharge Planning:              -- Social work and case management to assist with discharge planning and identification of hospital follow-up needs prior to discharge             -- Estimated LOS: 5-7 days             -- Discharge Concerns: Need to establish a safety plan; Medication compliance and effectiveness             -- Discharge Goals: Return home with outpatient referrals for mental health follow-up including medication management/psychotherapy    Christoper Allegra, MD 09/24/2022, 11:20 AM  Total Time Spent in Direct Patient Care:  I personally spent 35 minutes on the unit in direct patient care. The direct patient care time included face-to-face time with the patient, reviewing the patient's chart, communicating with other professionals, and coordinating care. Greater than 50% of this time was spent in counseling or coordinating care with the patient regarding goals of  hospitalization, psycho-education, and discharge planning needs.   Janine Limbo, MD Psychiatrist

## 2022-09-24 NOTE — Progress Notes (Signed)
   09/24/22 0100  Psych Admission Type (Psych Patients Only)  Admission Status Involuntary  Psychosocial Assessment  Patient Complaints Isolation;Irritability  Eye Contact Avoids  Facial Expression Angry  Affect Irritable;Angry  Speech Argumentative;Aggressive  Interaction Arrogant;Hostile  Motor Activity Slow  Appearance/Hygiene Unremarkable  Behavior Characteristics Irritable  Mood Suspicious;Angry  Thought Process  Coherency Blocking  Content UTA  Delusions UTA  Perception UTA  Hallucination UTA  Judgment UTA  Confusion UTA  Danger to Self  Current suicidal ideation? Denies  Danger to Others  Danger to Others None reported or observed

## 2022-09-24 NOTE — Progress Notes (Signed)
Recreation Therapy Notes  INPATIENT RECREATION THERAPY ASSESSMENT  Patient Details Name: Justin Chaney MRN: [id number] DOB: 03/31/1996 Today's Date: 09/24/2022       Information Obtained From: Patient  Able to Participate in Assessment/Interview: Yes  Patient Presentation: Alert, Confused  Reason for Admission (Per Patient): Other (Comments) (Per chart: aggression and responding to internal stimuli)  Patient Stressors: Other (Comment) (None identified)  Coping Skills:   Other (Comment) ("nature sounds")  Leisure Interests (2+):  Individual - Other (Comment) (play pool, darts)  Frequency of Recreation/Participation: Other (Comment) ("been a while")  Awareness of Community Resources:  Yes  Community Resources:  Other (Comment) Lexicographer)  Current Use: No  If no, Barriers?:    Expressed Interest in [employer] Information: No  County of Residence:  [place]  Patient Main Form of Transportation:    Patient Strengths:  Read; Follow directions  Patient Identified Areas of Improvement:  "no"  Patient Goal for Hospitalization:  "no"  Current SI (including self-harm):  No  Current HI:  No  Current AVH: No  Staff Intervention Plan: Group Attendance, Collaborate with Interdisciplinary Treatment Team  Consent to Intern Participation: N/A   Achilles Neville-McCall, LRT,CTRS [place] A Jameek Bruntz-McCall 09/24/2022, 1:44 PM

## 2022-09-24 NOTE — Progress Notes (Signed)
Pt refused to take PO bedtime medications after continuous med education and prompting. Pt made aware that he has an order for forced medications if he refuses. Pt verbalized he did not care and he "ain't eating no pill."  IM bedtime scheduled medications Alt given to patient. Pt did not require manual hold for medications. He voluntarily lifted his left sleeve for medications to be administered while saying " I'm tired of being here."  After IM medications was given in the Left Deltoid. Pt went back to sleep.
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
## 2022-09-24 NOTE — Progress Notes (Signed)
Adult Psychoeducational Group Note  Date:  09/24/2022 Time:  8:55 PM  Group Topic/Focus:  Wrap-Up Group:   The focus of this group is to help patients review their daily goal of treatment and discuss progress on daily workbooks.  Participation Level:  Did Not Attend  Participation Quality:   n/a  Affect:   n/a  Cognitive:   n/a  Insight: None  Engagement in Group:   n/a  Modes of Intervention:   n/a  Additional Comments:   Pt did not attend the Wrap Up group.  Wetzel Bjornstad Christen Bedoya 09/24/2022, 8:55 PM

## 2022-09-24 NOTE — Group Note (Signed)
Recreation Therapy Group Note   Group Topic:Problem Solving  Group Date: 09/24/2022 Start Time: 1005 End Time: 1035 Facilitators: Osmani Kersten-McCall, LRT,CTRS Location: [address] Dayroom   Goal Area(s) Addresses:  Patient will effectively work with peer towards shared goal.  Patient will identify skills used to make activity successful.  Patient will identify how skills used during activity can be applied to reach post d/c goals.   Group Description: [employer]. In teams of 5-6, patients were given 11 craft pipe cleaners. Using the materials provided, patients were instructed to compete again the opposing team(s) to build the tallest free-standing structure from floor level. The activity was timed; difficulty increased by Probation officer as Pharmacist, hospital continued.  Systematically resources were removed with additional directions for example, placing one arm behind their back, working in silence, and shape stipulations. LRT facilitated post-activity discussion reviewing team processes and necessary communication skills involved in completion. Patients were encouraged to reflect how the skills utilized, or not utilized, in this activity can be incorporated to positively impact support systems post discharge.   Affect/Mood: N/A   Participation Level: Did not attend    Clinical Observations/Individualized Feedback:     Plan: Continue to engage patient in RT group sessions 2-3x/week.   Simuel Stebner-McCall, LRT,CTRS 09/24/2022 1:06 PM

## 2022-09-24 NOTE — Group Note (Unsigned)
Date:  09/24/2022 Time:  7:43 AM  Group Topic/Focus:  Wrap-Up Group:   The focus of this group is to help patients review their daily goal of treatment and discuss progress on daily workbooks.     Participation Level:  {BHH PARTICIPATION HD:996081  Participation Quality:  {BHH PARTICIPATION QUALITY:22265}  Affect:  {BHH AFFECT:22266}  Cognitive:  {BHH COGNITIVE:22267}  Insight: {BHH Insight2:20797}  Engagement in Group:  {BHH ENGAGEMENT IN JY:[id number]  Modes of Intervention:  {BHH MODES OF INTERVENTION:22269}  Additional Comments:  ***  Debe Coder 09/24/2022, 7:43 AM
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
## 2022-09-24 NOTE — Group Note (Signed)
Date:  09/24/2022 Time:  11:50 AM  Group Topic/Focus:  Healthy Communication:   The focus of this group is to discuss communication, barriers to communication, as well as healthy ways to communicate with others.    Participation Level:  Did Not Attend   Iona Coach Marik Sedore 0000000, 11:50 AM

## 2022-09-24 NOTE — Group Note (Signed)
Date:  09/24/2022 Time:  6:03 PM  Group Topic/Focus:  Wellness Toolbox:   The focus of this group is to discuss various aspects of wellness, balancing those aspects and exploring ways to increase the ability to experience wellness.  Patients will create a wellness toolbox for use upon discharge.    Participation Level:  Did Not Attend   Iona Coach Maylene Crocker 0000000, 6:03 PM

## 2022-09-24 NOTE — Group Note (Unsigned)
Date:  09/24/2022 Time:  7:52 AM  Group Topic/Focus:  Wrap-Up Group:   The focus of this group is to help patients review their daily goal of treatment and discuss progress on daily workbooks.     Participation Level:  {BHH PARTICIPATION HD:[id number]  Participation Quality:  {BHH PARTICIPATION QUALITY:22265}  Affect:  {BHH AFFECT:22266}  Cognitive:  {BHH COGNITIVE:22267}  Insight: {BHH Insight2:20797}  Engagement in Group:  {BHH ENGAGEMENT IN JY:3131603  Modes of Intervention:  {BHH MODES OF INTERVENTION:22269}  Additional Comments:  ***  Debe Coder 09/24/2022, 7:52 AM

## 2022-09-24 NOTE — Progress Notes (Signed)
Manual hold done X 2 minutes for medication administration (agitation protocol). Pt was heard yelling, cursing in his room with loud bangs. Upon entering his room he was noted kicking, throwing trash can in the air and punching it while cursing at the trash can as well. Verbal redirections were ineffective at the time as pt became to threatened and charge at staff. Safety checks maintained at Q 15 minutes intervals "Get the fuck away from my door and my fucking room. I want to get the fuck out of here". Support, encouragement and reassurance offered to pt. Safety maintained in milieu.

## 2022-09-24 NOTE — BHH Group Notes (Signed)
Spirituality group facilitated by Chaplain Katy Mayukha Symmonds, BCC.  Group Description: Group focused on topic of hope. Patients participated in facilitated discussion around topic, connecting with one another around experiences and definitions for hope. Group members engaged with visual explorer photos, reflecting on what hope looks like for them today. Group engaged in discussion around how their definitions of hope are present today in hospital.  Modalities: Psycho-social ed, Adlerian, Narrative, MI  Patient Progress: Did not attend.  

## 2022-09-25 NOTE — Progress Notes (Signed)
[facility] MD Progress Note  09/25/2022 11:07 AM Justin Chaney  MRN:  [id number]  Subjective:     Patient is a 27 year old male with a documented psychiatric history of schizoaffective disorder bipolar type, presented to the [facility] under IVC with police escort.     Per staff report and chart review patient is using Haldol only IM forced medication orders, refusing p.o.,  Refusing Depakote this morning and last night, received a Depakote yesterday morning.  No episodes of aggression reported by staff.  On my exam today, patient Primarily lying down in bed then sits up looking at this provider answering questions in linear yet concrete manner he denies SI HI or AVH then focuses on wanting to be discharged today.  We discussed with patient need to comply with p.o. medications he tells me that he is when I informed him that this is further chart review indicates he responds "probably there is a mistake they are mixing up medications" patient was redirected to needing to comply with p.o. medications including Depakote and Haldol.  Unfortunately he is preoccupied with discussion about discharge "I am taking my medicine I am leaving today".  In general he seems to be presenting calmer and not responding to stimuli today which is an improvement, will continue current medication treatment and encourage compliance.   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   Depression    Homelessness    Schizophrenia ([place])    Seizures ([place])    childhood seizures   Suicidal  behavior    Suicidal intent     Past Surgical History:  Procedure Laterality Date   APPENDECTOMY     SHUNT REMOVAL     TEE WITHOUT CARDIOVERSION N/A 08/14/2019   Procedure: TRANSESOPHAGEAL ECHOCARDIOGRAM (TEE);  Surgeon: Pixie Casino, MD;  Location: [facility] ENDOSCOPY;  Service: Cardiovascular;  Laterality: N/A;   TONSILLECTOMY     VENTRICULO-PERITONEAL SHUNT PLACEMENT / LAPAROSCOPIC INSERTION PERITONEAL CATHETER     Family History:  Family History  Problem Relation Age of Onset   Hypertension Mother    Mental illness Neg Hx    Family Psychiatric  History:  Patient refused to discuss all aspects of this    Social History:  Social History   Substance and Sexual Activity  Alcohol Use No     Social History   Substance and Sexual Activity  Drug Use Yes   Types: Other-see comments, Amphetamines    Social History   Socioeconomic History   Marital status: Single    Spouse name: Not on file   Number of children: Not on file   Years of education: Not on file   Highest education level: Not on file  Occupational History   Not on file  Tobacco Use   Smoking status: Every Day    Packs/day: 0.00    Types: Cigars, Cigarettes   Smokeless tobacco: Never  Vaping Use   Vaping Use: Never used  Substance and Sexual Activity   Alcohol use: No   Drug use: Yes    Types: Other-see comments, Amphetamines   Sexual activity: Not on file  Other Topics Concern  Not on file  Social History Narrative   Not on file   Social Determinants of Health   Financial Resource Strain: Not on file  Food Insecurity: Not on file  Transportation Needs: Not on file  Physical Activity: Not on file  Stress: Not on file  Social Connections: Not on file   Additional Social History:                         Sleep: Fair  Appetite: okay  Current Medications: Current Facility-Administered Medications  Medication Dose Route Frequency Provider Last Rate Last Admin   acetaminophen  (TYLENOL) tablet 650 mg  650 mg Oral Q6H PRN Derrill Center, NP       alum & mag hydroxide-simeth (MAALOX/MYLANTA) 200-200-20 MG/5ML suspension 30 mL  30 mL Oral Q4H PRN Derrill Center, NP       benztropine (COGENTIN) tablet 0.5 mg  0.5 mg Oral Q12H Massengill, Nathan, MD   0.5 mg at 09/25/22 [id number]   Or   benztropine mesylate (COGENTIN) injection 0.5 mg  0.5 mg Intramuscular Q12H Massengill, Ovid Curd, MD   0.5 mg at 09/24/22 2045   benztropine mesylate (COGENTIN) injection 2 mg  2 mg Intramuscular BID PRN Massengill, Ovid Curd, MD       haloperidol (HALDOL) tablet 5 mg  5 mg Oral TID PRN Janine Limbo, MD   5 mg at 09/23/22 1130   And   LORazepam (ATIVAN) tablet 2 mg  2 mg Oral TID PRN Janine Limbo, MD   2 mg at 09/23/22 1130   And   diphenhydrAMINE (BENADRYL) capsule 50 mg  50 mg Oral TID PRN Janine Limbo, MD   50 mg at 09/23/22 1130   haloperidol lactate (HALDOL) injection 5 mg  5 mg Intramuscular TID PRN Janine Limbo, MD   5 mg at 09/24/22 1505   And   LORazepam (ATIVAN) injection 2 mg  2 mg Intramuscular TID PRN Janine Limbo, MD   2 mg at 09/24/22 1506   And   diphenhydrAMINE (BENADRYL) injection 50 mg  50 mg Intramuscular TID PRN Janine Limbo, MD   50 mg at 09/24/22 1505   haloperidol (HALDOL) 2 MG/ML solution 10 mg  10 mg Oral Q12H Massengill, Ovid Curd, MD   10 mg at 09/25/22 [id number]   Or   haloperidol lactate (HALDOL) injection 5 mg  5 mg Intramuscular Q12H Massengill, Ovid Curd, MD   5 mg at 09/24/22 2053   hydrOXYzine (ATARAX) tablet 25 mg  25 mg Oral TID PRN Janine Limbo, MD   25 mg at 09/25/22 [id number]   magnesium hydroxide (MILK OF MAGNESIA) suspension 30 mL  30 mL Oral Daily PRN Derrill Center, NP       nicotine (NICODERM CQ - dosed in mg/24 hours) patch 14 mg  14 mg Transdermal Daily PRN Massengill, Ovid Curd, MD       traZODone (DESYREL) tablet 50 mg  50 mg Oral QHS PRN Derrill Center, NP       valproic acid (DEPAKENE) 250 MG/5ML solution 500 mg  500 mg  Oral Q12H Massengill, Nathan, MD   500 mg at 09/24/22 1156    Lab Results: No results found for this or any previous visit (from the past 38 hour(s)).  Blood Alcohol level:  Lab Results  Component Value Date   [facility] <10 09/18/2022   ETH <10 [id number]    Metabolic Disorder Labs: Lab Results  Component Value Date   HGBA1C 3.9 (L)  09/18/2022   MPG 65.23 09/18/2022   MPG 97 12/22/2016   Lab Results  Component Value Date   PROLACTIN 2.4 (L) 12/22/2016   PROLACTIN 8.0 09/09/2015   Lab Results  Component Value Date   CHOL 113 09/18/2022   TRIG 112 09/18/2022   HDL 46 09/18/2022   CHOLHDL 2.5 09/18/2022   VLDL 22 09/18/2022   LDLCALC 45 09/18/2022   LDLCALC 56 12/22/2016    Physical Findings: AIMS: Facial and Oral Movements Muscles of Facial Expression: None, normal Lips and Perioral Area: None, normal Jaw: None, normal Tongue: None, normal,Extremity Movements Upper (arms, wrists, hands, fingers): None, normal Lower (legs, knees, ankles, toes): None, normal, Trunk Movements Neck, shoulders, hips: None, normal, Overall Severity Severity of abnormal movements (highest score from questions above): None, normal Incapacitation due to abnormal movements: None, normal Patient's awareness of abnormal movements (rate only patient's report): No Awareness, Dental Status Current problems with teeth and/or dentures?: No Does patient usually wear dentures?: No  CIWA:    COWS:     Musculoskeletal: Strength & Muscle Tone: within normal limits Gait & Station: normal Patient leans: N/A  Patient refuses physical exam today for testing any tremor, cogwheeling, rigidity.  Psychiatric Specialty Exam:  Presentation  General Appearance:  -- laying in bed  Eye Contact: Poor  Speech: --soft  Speech Volume: sofr  Handedness: -- (uncooperative, uta)   Mood and Affect  Mood: Irritable   Affect: Congruent   Thought Process  Thought Processes: --  (Tangential)  Descriptions of Associations:Tangential  Orientation:Partial  Thought Content:Tangential  History of Schizophrenia/Schizoaffective disorder:Yes  Duration of Psychotic Symptoms:Less than six months  Hallucinations:No data recorded Denying AH, VH. Does not appear to be RTIS today.   Ideas of Reference:-- (Paranoid appearing) Suspicious   Suicidal Thoughts:No data recorded Did not respond to questioning of this  Homicidal Thoughts:No data recorded Did not respond to questioning of this  Sensorium  Memory: -- (Refuses to answer)  Judgment: Poor  Insight: Lacking   Executive Functions  Concentration: Poor  Attention Span: Poor  Recall: Other (comment) (uncooperative, uta)  Fund of Knowledge: Other (comment) (uncooperative, uta)  Language: Other (comment) (uncooperative, uta)   Psychomotor Activity  Psychomotor Activity: No data recorded Laying in bed   Assets  Assets: Financial Resources/Insurance   Sleep  Sleep: No data recorded Adequate    Physical Exam: Physical Exam Vitals and nursing note reviewed.  Neurological:     Mental Status: He is alert.     Motor: No weakness.     Gait: Gait normal.    Review of Systems  Reason unable to perform ROS: Patient agitated, not participating in the exam.  Psychiatric/Behavioral:  Positive for hallucinations. The patient is nervous/anxious.    Blood pressure 131/74, pulse 93, temperature 98.4 F (36.9 C), temperature source Oral, resp. rate 20, weight 90.7 kg, SpO2 99 %. Body mass index is 36.58 kg/m.   Treatment Plan Summary:   **Patient has a legal guardian**   PLAN: Safety and Monitoring:             -- Involuntary admission to inpatient psychiatric unit for safety, stabilization and treatment             -- Daily contact with patient to assess and evaluate symptoms and progress in treatment             -- Patient's case to be discussed in multi-disciplinary team  meeting             --  Observation Level : q15 minute checks             -- Vital signs:  q12 hours             -- Precautions: suicide, elopement, and assault   2. Psychiatric Diagnoses and Treatment:               -Continue Haldol 10 mg every 12 hours for psychotic disorder.  This is a forced medication.  Second opinion evaluation on 09-21-22, valid for 7 days. Ordered EKG on admission and reminded nursing to attempt to get this as soon it is safe to do this.  Last EKG was in 2021 -*No QTc prolongation.  -Continue Cogentin 0.5 mg every 12 hours for EPS prophylaxis -Continue Depakene liquid 500 mg q12H - for mood stability, irritability to and augment antipsychotic    -Continue Haldol oral and intramuscular PRN for agitation (combined with Ativan and Benadryl)     --  The risks/benefits/side-effects/alternatives to this medication were discussed in detail with the patient and time was given for questions. The patient consents to medication trial.                -- Metabolic profile and EKG monitoring obtained while on an atypical antipsychotic (BMI: Lipid Panel: HbgA1c: QTc:)              -- Encouraged patient to participate in unit milieu and in scheduled group therapies                     3. Medical Issues Being Addressed:              Tobacco Use Disorder             -- Nicotine patch 14 mg/24 hours ordered             -- Smoking cessation encouraged   4. Discharge Planning:              -- Social work and case management to assist with discharge planning and identification of hospital follow-up needs prior to discharge             -- Estimated LOS: 5-7 days             -- Discharge Concerns: Need to establish a safety plan; Medication compliance and effectiveness             -- Discharge Goals: Return home with outpatient referrals for mental health follow-up including medication management/psychotherapy    Total Time Spent in Direct Patient Care:  I personally spent 35 minutes  on the unit in direct patient care. The direct patient care time included face-to-face time with the patient, reviewing the patient's chart, communicating with other professionals, and coordinating care. Greater than 50% of this time was spent in counseling or coordinating care with the patient regarding goals of hospitalization, psycho-education, and discharge planning needs.   Jessi Jessop Winfred Leeds, MD 09/25/2022, 11:07 AM

## 2022-09-25 NOTE — Progress Notes (Signed)
Pt did not attend group. 

## 2022-09-25 NOTE — Progress Notes (Signed)
   09/25/22 0644  15 Minute Checks  Location Bedroom  Visual Appearance Calm  Behavior Sleeping  Sleep (Behavioral Health Patients Only)  Calculate sleep? (Click Yes once per 24 hr at 0600 safety check) Yes  Documented sleep last 24 hours 11.5

## 2022-09-25 NOTE — Progress Notes (Signed)
   09/25/22 1147  Psych Admission Type (Psych Patients Only)  Admission Status Involuntary  Psychosocial Assessment  Patient Complaints Agitation;Irritability;Isolation  Eye Contact Avoids  Facial Expression Angry  Affect Irritable;Labile  Speech Elective mutism  Interaction Cautious;Guarded  Motor Activity Other (Comment) (WNL)  Appearance/Hygiene Body odor;Disheveled;Poor hygiene  Behavior Characteristics Unwilling to participate;Guarded  Mood Labile;Irritable  Aggressive Behavior  Effect No apparent injury  Thought Process  Coherency Blocking  Content UTA  Delusions UTA  Perception UTA  Hallucination UTA  Judgment UTA  Confusion UTA  Danger to Self  Current suicidal ideation? Denies  Danger to Others  Danger to Others None reported or observed

## 2022-09-25 NOTE — Progress Notes (Signed)
   09/25/22 2100  Psych Admission Type (Psych Patients Only)  Admission Status Involuntary  Psychosocial Assessment  Patient Complaints Irritability;Isolation  Eye Contact Avoids  Facial Expression Angry  Affect Appropriate to circumstance  Speech Logical/coherent  Interaction Cautious;Guarded  Motor Activity Fidgety  Appearance/Hygiene Disheveled  Behavior Characteristics Unwilling to participate  Mood Suspicious;Anxious  Thought Process  Coherency Disorganized  Content UTA  Delusions UTA  Perception UTA  Hallucination UTA  Judgment UTA  Confusion UTA  Danger to Self  Current suicidal ideation? Denies  Danger to Others  Danger to Others None reported or observed   Alert/oriented. Requested snack from staff. Came to med window, willingly took forced meds but not depakote.  Will encourage  compliance and progression towards goals. When asked if doing ok today, Patient stated 'Im ok'

## 2022-09-25 NOTE — Progress Notes (Deleted)
   09/25/22 0644  15 Minute Checks  Location Bedroom  Visual Appearance Calm  Behavior Sleeping  Sleep (Behavioral Health Patients Only)  Calculate sleep? (Click Yes once per 24 hr at 0600 safety check) Yes

## 2022-09-25 NOTE — Plan of Care (Signed)
  Problem: Safety: Goal: Ability to remain free from injury will improve Outcome: Progressing   

## 2022-09-25 NOTE — Progress Notes (Signed)
Patient denied scheduled Depakote. Elective mutism. Patient took scheduled Cogentin and Haldol. Patient refused to answer assessment questions.

## 2022-09-26 NOTE — Plan of Care (Signed)
  Problem: Education: Goal: Knowledge of  General Education information/materials will improve Outcome: Progressing Goal: Emotional status will improve Outcome: Progressing Goal: Mental status will improve Outcome: Progressing Goal: Verbalization of understanding the information provided will improve Outcome: Progressing   Problem: Coping: Goal: Ability to verbalize frustrations and anger appropriately will improve Outcome: Progressing Goal: Ability to demonstrate self-control will improve Outcome: Progressing

## 2022-09-26 NOTE — Progress Notes (Signed)
Did not attend group 

## 2022-09-26 NOTE — Progress Notes (Signed)
Adult Psychoeducational Group Note  Date:  09/26/2022 Time:  09:30 am  Group Topic/Focus:  Mental Health Video  Participation Level:  Did Not Attend  Participation Quality:   n/a  Affect:   n/a  Cognitive:   n/a  Insight: None  Engagement in Group:   n/a  Modes of Intervention:   n/a  Additional Comments:   Patient did not attend  Barbaraann Share 09/26/2022, 5:24 PM

## 2022-09-26 NOTE — Progress Notes (Signed)
[facility] MD Progress Note  09/26/2022 10:34 AM Justin Chaney  MRN:  [id number]  Subjective:     Patient is a 27 year old male with a documented psychiatric history of schizoaffective disorder bipolar type, presented to the [facility] under IVC with police escort.     Per staff report and chart review last as needed medication for agitation and aggression was given on 2/23.  Per chart review patient received Haldol IM this morning and received a Depakote p.o. for the first time while he has been refusing it all along.   On my exam today, patient continues to be pretty provide with wanting discharge and tells this provider that he is taking his medicine he was redirected that he did not take his Depakote this morning then he agrees to take but continues to ask for discharge he continues to deny SI HI or AVH but when I asked him about his plan after discharge or where he is going to go or who he is going to be staying with he starts talking nonsensical and gets easily fixated again on wanting discharge today, no physical or verbal agitation noted which is an improvement, does not demonstrate any sign consistent with EPS or TD and denies side effect to current medications, displays very limited insight to severity of his mental illness and need to continue medications after discharge.  He does report fair sleep and appetite but remains full through the conversation fixated about wanting discharge in form of right after answering each question "I am ready for discharge I need to go now" I discussed with patient need to do an EKG today and will follow if he will comply.  Will continue current medications the same with no changes and continue to encourage compliance with oral medications.  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   Depression    Homelessness    Schizophrenia ([place])    Seizures ([place])    childhood seizures   Suicidal behavior    Suicidal intent     Past Surgical History:  Procedure Laterality Date   APPENDECTOMY     SHUNT REMOVAL     TEE WITHOUT CARDIOVERSION N/A 08/14/2019   Procedure: TRANSESOPHAGEAL ECHOCARDIOGRAM (TEE);  Surgeon: Pixie Casino, MD;  Location: [facility] ENDOSCOPY;  Service: Cardiovascular;  Laterality: N/A;   TONSILLECTOMY     VENTRICULO-PERITONEAL SHUNT PLACEMENT / LAPAROSCOPIC INSERTION PERITONEAL CATHETER     Family History:  Family History  Problem Relation Age of Onset   Hypertension Mother    Mental illness Neg Hx    Family Psychiatric  History:  Patient refused to discuss all aspects of this    Social History:  Social History   Substance and Sexual Activity  Alcohol Use No     Social History   Substance and Sexual Activity  Drug Use Yes   Types: Other-see comments, Amphetamines    Social History   Socioeconomic History   Marital status: Single    Spouse name: Not on file   Number of children: Not on file   Years of education: Not on file   Highest education level:  Not on file  Occupational History   Not on file  Tobacco Use   Smoking status: Every Day    Packs/day: 0.00    Types: Cigars, Cigarettes   Smokeless tobacco: Never  Vaping Use   Vaping Use: Never used  Substance and Sexual Activity   Alcohol use: No   Drug use: Yes    Types: Other-see comments, Amphetamines   Sexual activity: Not on file  Other Topics Concern   Not on file  Social History Narrative   Not on file   Social Determinants of Health   Financial Resource Strain: Not on file  Food Insecurity:  Not on file  Transportation Needs: Not on file  Physical Activity: Not on file  Stress: Not on file  Social Connections: Not on file   Additional Social History:                         Sleep: Fair  Appetite: okay  Current Medications: Current Facility-Administered Medications  Medication Dose Route Frequency Provider Last Rate Last Admin   acetaminophen (TYLENOL) tablet 650 mg  650 mg Oral Q6H PRN Derrill Center, NP       alum & mag hydroxide-simeth (MAALOX/MYLANTA) 200-200-20 MG/5ML suspension 30 mL  30 mL Oral Q4H PRN Derrill Center, NP       benztropine (COGENTIN) tablet 0.5 mg  0.5 mg Oral Q12H Massengill, Nathan, MD   0.5 mg at 09/25/22 1924   Or   benztropine mesylate (COGENTIN) injection 0.5 mg  0.5 mg Intramuscular Q12H Massengill, Ovid Curd, MD   0.5 mg at 09/26/22 1000   benztropine mesylate (COGENTIN) injection 2 mg  2 mg Intramuscular BID PRN Massengill, Ovid Curd, MD       haloperidol (HALDOL) tablet 5 mg  5 mg Oral TID PRN Janine Limbo, MD   5 mg at 09/23/22 1130   And   LORazepam (ATIVAN) tablet 2 mg  2 mg Oral TID PRN Janine Limbo, MD   2 mg at 09/23/22 1130   And   diphenhydrAMINE (BENADRYL) capsule 50 mg  50 mg Oral TID PRN Janine Limbo, MD   50 mg at 09/23/22 1130   haloperidol lactate (HALDOL) injection 5 mg  5 mg Intramuscular TID PRN Janine Limbo, MD   5 mg at 09/24/22 1505   And   LORazepam (ATIVAN) injection 2 mg  2 mg Intramuscular TID PRN Janine Limbo, MD   2 mg at 09/24/22 1506   And   diphenhydrAMINE (BENADRYL) injection 50 mg  50 mg Intramuscular TID PRN Janine Limbo, MD   50 mg at 09/24/22 1505   haloperidol (HALDOL) 2 MG/ML solution 10 mg  10 mg Oral Q12H Massengill, Ovid Curd, MD   10 mg at 09/25/22 [id number]   Or   haloperidol lactate (HALDOL) injection 5 mg  5 mg Intramuscular Q12H Massengill, Ovid Curd, MD   5 mg at 09/26/22 1000   hydrOXYzine (ATARAX) tablet 25 mg  25 mg Oral TID PRN Janine Limbo, MD   25 mg  at 09/25/22 0806   magnesium hydroxide (MILK OF MAGNESIA) suspension 30 mL  30 mL Oral Daily PRN Derrill Center, NP       nicotine (NICODERM CQ - dosed in mg/24 hours) patch 14 mg  14 mg Transdermal Daily PRN Massengill, Ovid Curd, MD       traZODone (DESYREL) tablet 50 mg  50 mg Oral QHS PRN Derrill Center, NP  valproic acid (DEPAKENE) 250 MG/5ML solution 500 mg  500 mg Oral Q12H Massengill, Nathan, MD   500 mg at 09/26/22 1003    Lab Results: No results found for this or any previous visit (from the past 48 hour(s)).  Blood Alcohol level:  Lab Results  Component Value Date   ETH <10 09/18/2022   ETH <10 [id number]    Metabolic Disorder Labs: Lab Results  Component Value Date   HGBA1C 3.9 (L) 09/18/2022   MPG 65.23 09/18/2022   MPG 97 12/22/2016   Lab Results  Component Value Date   PROLACTIN 2.4 (L) 12/22/2016   PROLACTIN 8.0 09/09/2015   Lab Results  Component Value Date   CHOL 113 09/18/2022   TRIG 112 09/18/2022   HDL 46 09/18/2022   CHOLHDL 2.5 09/18/2022   VLDL 22 09/18/2022   LDLCALC 45 09/18/2022   LDLCALC 56 12/22/2016    Physical Findings: AIMS: Facial and Oral Movements Muscles of Facial Expression: None, normal Lips and Perioral Area: None, normal Jaw: None, normal Tongue: None, normal,Extremity Movements Upper (arms, wrists, hands, fingers): None, normal Lower (legs, knees, ankles, toes): None, normal, Trunk Movements Neck, shoulders, hips: None, normal, Overall Severity Severity of abnormal movements (highest score from questions above): None, normal Incapacitation due to abnormal movements: None, normal Patient's awareness of abnormal movements (rate only patient's report): No Awareness, Dental Status Current problems with teeth and/or dentures?: No Does patient usually wear dentures?: No  CIWA:    COWS:     Musculoskeletal: Strength & Muscle Tone: within normal limits Gait & Station: normal Patient leans: N/A  Patient refuses physical  exam today for testing any tremor, cogwheeling, rigidity.  Psychiatric Specialty Exam:  Presentation  General Appearance:  -- laying in bed  Eye Contact: Poor  Speech: --soft  Speech Volume: sofr  Handedness: -- (uncooperative, uta)   Mood and Affect  Mood: Irritable   Affect: Congruent   Thought Process  Thought Processes: -- (Tangential)  Descriptions of Associations:Tangential  Orientation:Partial  Thought Content:Tangential  History of Schizophrenia/Schizoaffective disorder:Yes  Duration of Psychotic Symptoms:Less than six months  Hallucinations:No data recorded Denying AH, VH. Does not appear to be RTIS today.   Ideas of Reference:-- (Paranoid appearing) Suspicious   Suicidal Thoughts:No data recorded Did not respond to questioning of this  Homicidal Thoughts:No data recorded Did not respond to questioning of this  Sensorium  Memory: -- (Refuses to answer)  Judgment: Poor  Insight: Lacking   Executive Functions  Concentration: Poor  Attention Span: Poor  Recall: Other (comment) (uncooperative, uta)  Fund of Knowledge: Other (comment) (uncooperative, uta)  Language: Other (comment) (uncooperative, uta)   Psychomotor Activity  Psychomotor Activity: No data recorded Laying in bed   Assets  Assets: Financial Resources/Insurance   Sleep  Sleep: No data recorded Adequate    Physical Exam: Physical Exam Vitals and nursing note reviewed.  Neurological:     Mental Status: He is alert.     Motor: No weakness.     Gait: Gait normal.    Review of Systems  Reason unable to perform ROS: Patient agitated, not participating in the exam.  Psychiatric/Behavioral:  Positive for hallucinations. The patient is nervous/anxious.    Blood pressure 131/74, pulse 93, temperature 98.4 F (36.9 C), temperature source Oral, resp. rate 20, weight 90.7 kg, SpO2 99 %. Body mass index is 36.58 kg/m.   Treatment Plan  Summary:   **Patient has a legal guardian**   PLAN: Safety and Monitoring:             --  Involuntary admission to inpatient psychiatric unit for safety, stabilization and treatment             -- Daily contact with patient to assess and evaluate symptoms and progress in treatment             -- Patient's case to be discussed in multi-disciplinary team meeting             -- Observation Level : q15 minute checks             -- Vital signs:  q12 hours             -- Precautions: suicide, elopement, and assault   2. Psychiatric Diagnoses and Treatment:               -Continue Haldol 10 mg every 12 hours for psychotic disorder.  This is a forced medication.  Second opinion evaluation on 09-21-22, valid for 7 days. Ordered EKG on admission and reminded nursing to attempt to get this as soon it is safe to do this.  Last EKG was in 2021 -*No QTc prolongation.  -Continue Cogentin 0.5 mg every 12 hours for EPS prophylaxis -Continue Depakene liquid 500 mg q12H - for mood stability, irritability to and augment antipsychotic    -Continue Haldol oral and intramuscular PRN for agitation (combined with Ativan and Benadryl)     --  The risks/benefits/side-effects/alternatives to this medication were discussed in detail with the patient and time was given for questions. The patient consents to medication trial.                -- Metabolic profile and EKG monitoring obtained while on an atypical antipsychotic (BMI: Lipid Panel: HbgA1c: QTc:)              -- Encouraged patient to participate in unit milieu and in scheduled group therapies                     3. Medical Issues Being Addressed:              Tobacco Use Disorder             -- Nicotine patch 14 mg/24 hours ordered             -- Smoking cessation encouraged   4. Discharge Planning:              -- Social work and case management to assist with discharge planning and identification of hospital follow-up needs prior to discharge              -- Estimated LOS: 5-7 days             -- Discharge Concerns: Need to establish a safety plan; Medication compliance and effectiveness             -- Discharge Goals: Return home with outpatient referrals for mental health follow-up including medication management/psychotherapy    Total Time Spent in Direct Patient Care:  I personally spent 35 minutes on the unit in direct patient care. The direct patient care time included face-to-face time with the patient, reviewing the patient's chart, communicating with other professionals, and coordinating care. Greater than 50% of this time was spent in counseling or coordinating care with the patient regarding goals of hospitalization, psycho-education, and discharge planning needs.   Marcianne Ozbun Winfred Leeds, MD 09/26/2022, 10:34 AM

## 2022-09-26 NOTE — Progress Notes (Signed)
Adult Psychoeducational Group Note  Date:  09/26/2022 Time:  8:45 PM  Group Topic/Focus:  Wrap-Up Group:   The focus of this group is to help patients review their daily goal of treatment and discuss progress on daily workbooks.  Participation Level:  Did Not Attend  Participation Quality:   Did Not Attend  Affect:   Did Not Attend   Cognitive:   Did Not Attend  Insight: None  Engagement in Group:   Did Not Attend  Modes of Intervention:   Did Not Attend  Additional Comments:  Pt was encouraged to attend wrap up group but did not attend.  Candy Sledge 09/26/2022, 8:45 PM

## 2022-09-26 NOTE — Progress Notes (Signed)
Adult Psychoeducational Group Note  Date:  09/26/2022 Time:  10:00 AM  Group Topic/Focus:  Goals Group:   The focus of this group is to help patients establish daily goals to achieve during treatment and discuss how the patient can incorporate goal setting into their daily lives to aide in recovery.  Participation Level:  Did Not Attend  Participation Quality:   n/a  Affect:   n/a  Cognitive:   n/a  Insight: None  Engagement in Group:   n/a  Modes of Intervention:   n/a  Additional Comments:   Patient did not attend the Community Meeting/Goals Group.  Wetzel Bjornstad Pacey Altizer 09/26/2022, 10:00 AM

## 2022-09-26 NOTE — Progress Notes (Signed)
   09/26/22 0800  Psych Admission Type (Psych Patients Only)  Admission Status Involuntary  Psychosocial Assessment  Patient Complaints Irritability;Isolation  Eye Contact Avoids  Facial Expression Angry  Affect Blunted;Irritable;Flat  Speech Argumentative  Interaction Cautious;Guarded  Motor Activity Fidgety  Appearance/Hygiene Improved  Behavior Characteristics Unwilling to participate  Mood Suspicious;Anxious  Thought Process  Coherency Disorganized  Content Preoccupation  Delusions None reported or observed  Perception UTA  Hallucination UTA  Judgment Poor  Confusion UTA  Danger to Self  Current suicidal ideation? Denies  Agreement Not to Harm Self Yes  Description of Agreement Verbal  Danger to Others  Danger to Others None reported or observed

## 2022-09-26 NOTE — Progress Notes (Signed)
   09/26/22 2206  Psych Admission Type (Psych Patients Only)  Admission Status Involuntary  Psychosocial Assessment  Patient Complaints Irritability;Isolation  Eye Contact Avoids  Facial Expression Angry  Affect Appropriate to circumstance  Speech Logical/coherent  Interaction Cautious;Guarded  Motor Activity Fidgety  Appearance/Hygiene Disheveled  Behavior Characteristics Unwilling to participate  Mood Suspicious;Anxious  Thought Process  Coherency Disorganized  Content UTA  Delusions UTA  Perception UTA  Hallucination UTA  Judgment UTA  Confusion UTA  Danger to Others  Danger to Others None reported or observed   Alert/oriented. Med compliant, took meds without incident. Asked staff for snacks. Will encourage  compliance and progression towards goals. Will continue to monitor.

## 2022-09-26 NOTE — Plan of Care (Signed)
  Problem: Safety: Goal: Periods of time without injury will increase Outcome: Progressing

## 2022-09-27 ENCOUNTER — Encounter (HOSPITAL_COMMUNITY): Payer: Self-pay

## 2022-09-27 NOTE — Progress Notes (Signed)
   09/27/22 0830  Psych Admission Type (Psych Patients Only)  Admission Status Involuntary  Psychosocial Assessment  Patient Complaints Sadness  Eye Contact Avoids  Facial Expression Blank  Affect Sullen  Speech Slow  Interaction Guarded  Motor Activity Slow  Appearance/Hygiene Unremarkable  Behavior Characteristics Cooperative  Mood Preoccupied  Aggressive Behavior  Targets Self  Thought Process  Coherency WDL  Content Blaming others  Delusions None reported or observed  Perception WDL  Hallucination None reported or observed  Judgment Poor  Confusion WDL  Danger to Self  Current suicidal ideation? Denies  Agreement Not to Harm Self Yes  Description of Agreement Verbal  Danger to Others  Danger to Others None reported or observed

## 2022-09-27 NOTE — Progress Notes (Signed)
Pt continues to be focused on D/C , pt informed to talk to the doctor    09/27/22 2100  Psych Admission Type (Psych Patients Only)  Admission Status Involuntary  Psychosocial Assessment  Patient Complaints Worrying  Eye Contact Avoids  Facial Expression Flat  Affect Sad;Preoccupied  Speech Slow  Interaction Guarded  Motor Activity Slow  Appearance/Hygiene Disheveled  Behavior Characteristics Cooperative  Mood Preoccupied  Aggressive Behavior  Effect No apparent injury  Thought Process  Coherency WDL  Content Blaming others  Delusions None reported or observed  Perception WDL  Hallucination None reported or observed  Judgment Poor  Confusion WDL  Danger to Self  Current suicidal ideation? Denies  Danger to Others  Danger to Others None reported or observed

## 2022-09-27 NOTE — Group Note (Signed)
LCSW Group Therapy Note   Group Date: 09/27/2022 Start Time: 1300 End Time: 1400  Type of Therapy and Topic:  Group Therapy:  Self-Care after Hospitalization  Participation Level:  Did Not Attend   Description of Group This process group involved patients discussing how they plan to take care of themselves in a better manner when they get home from the hospital.  The group started with patients listing one healthy and one unhealthy way they took care of themselves prior to hospitalization.  A discussion ensued about the differences in healthy and unhealthy coping skills.  Group members shared ideas about making changes when they return home so that they can stay well and in recovery.  The white board was used to list ideas so that patients can continue to see these ideas throughout the day.  Therapeutic Goals Patient will identify and describe one healthy and one unhealthy coping technique used prior to hospitalization Patient will participate in generating ideas about healthy self-care options when they return to the community Patients will be supportive of one another and receive said support from others Patient will identify one healthy self-care activity to add to his/her post-hospitalization life that can help in recovery  Summary of Patient Progress: Did Not Attend    Therapeutic Modalities Brief Solution-Focused Therapy Motivational Interviewing Psychoeducation    Sherre Lain, [place] 09/27/2022  3:22 PM

## 2022-09-27 NOTE — [facility] Suicide Risk Assessment (Signed)
[facility] INPATIENT:  Family/Significant Other Suicide Prevention Education  Suicide Prevention Education:  Education Completed; [name], DSS guardian, [phone]  (name of family member/significant other) has been identified by the patient as the family member/significant other with whom the patient will be residing, and identified as the person(s) who will aid the patient in the event of a mental health crisis (suicidal ideations/suicide attempt).  With written consent from the patient, the family member/significant other has been provided the following suicide prevention education, prior to the and/or following the discharge of the patient.  Guardian reports that patient will go back to [facility] when discharged.  She states that he is med non compliant and will need to be on LAI.  She reports no additional safety concerns and that patient has no access to guns weapons.   The suicide prevention education provided includes the following: Suicide risk factors Suicide prevention and interventions National Suicide Hotline telephone number [facility] assessment telephone number [facility] Emergency Assistance [place] and/or Residential Mobile Crisis Unit telephone number  Request made of family/significant other to: Remove weapons (e.g., guns, rifles, knives), all items previously/currently identified as safety concern.   Remove drugs/medications (over-the-counter, prescriptions, illicit drugs), all items previously/currently identified as a safety concern.  The family member/significant other verbalizes understanding of the suicide prevention education information provided.  The family member/significant other agrees to remove the items of safety concern listed above.  Alister Staver E Duaine Radin 09/27/2022, 3:58 PM

## 2022-09-27 NOTE — Progress Notes (Signed)
Pt continues to take medications only to get D/C , pt continues to express he is not going to take medications when he D/C like he normally does.

## 2022-09-27 NOTE — Progress Notes (Addendum)
[facility] MD Progress Note  09/27/2022 12:13 PM Justin Chaney  MRN:  [id number]  Subjective:     Patient is a 27 year old male with a documented psychiatric history of schizoaffective disorder bipolar type, presented to the [facility] under IVC with police escort.     On my exam today, the patient participates in the exam.  He states that he is not having any psychiatric symptoms, and specifically denies having any AH or VH.  He is irritable.  He denies any SI or HI.  Patient asked when he can be discharged charge and asked for a bus pass.  He states "I am ready to go.  I do not need to be in this hospital anymore.  I do not need to take any medications.  The medications are not doing anything for me."  I discussed with him both legal guardian recommended the Haldol LAI, and the patient responded "I do not need an injectable medication.  On taking other medications are giving me here.  I do not have a legal guardian.  I do not need any medications.  Just let me out of here."  I discussed with him at length that he did have a legal guardian the patient continues to deny that he has a legal guardian at all.  I discussed with the patient extensively that he should have his vitals taken by nursing (he refused vitals this morning by nursing) and that he had an EKG so that we can monitor the cardiac side effects of the Haldol and the patient responded "I do not need an EKG.  I does need to get out of here."   I discussed with the patient, now that he is participating in the interview, and denying have any psychiatric symptoms, in order to stay with discharge the patient, he was continued to be compliant with prescribed medications, not requiring any agitation PRNs, and without any concerns for irritability or aggression from the nursing staff.  According to nursing documentation yesterday, the patient continues to be irritable, argumentative, guarded.  Additionally, the legal guardian was also consent for the patient to  be discharged.  The patient is refusing basic medical workup and care in the hospital including vital signs and EKG.  He continues to lack insight into his psychiatric illness and need for treatment and demonstrate poor judgment as evidenced by his stating that he will not take medications once he is discharged from the hospital, and psychiatric medications have provided him no benefit.  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   Depression    Homelessness    Schizophrenia ([place])    Seizures ([place])    childhood seizures   Suicidal behavior    Suicidal intent     Past Surgical History:  Procedure Laterality Date   APPENDECTOMY     SHUNT REMOVAL     TEE WITHOUT CARDIOVERSION N/A 08/14/2019   Procedure: TRANSESOPHAGEAL ECHOCARDIOGRAM (TEE);  Surgeon: Pixie Casino, MD;  Location: [facility] ENDOSCOPY;  Service: Cardiovascular;  Laterality: N/A;   TONSILLECTOMY     VENTRICULO-PERITONEAL SHUNT PLACEMENT / [place]  Family History:  Family History  Problem Relation Age of Onset   Hypertension Mother    Mental illness Neg Hx    Family Psychiatric  History:  Patient refused to discuss all aspects of this    Social History:  Social History   Substance and Sexual Activity  Alcohol Use No     Social History   Substance and Sexual Activity  Drug Use Yes   Types: Other-see comments, Amphetamines    Social History   Socioeconomic History   Marital status: Single    Spouse name: Not on file   Number of  children: Not on file   Years of education: Not on file   Highest education level: Not on file  Occupational History   Not on file  Tobacco Use   Smoking status: Every Day    Packs/day: 0.00    Types: Cigars, Cigarettes   Smokeless tobacco: Never  Vaping Use   Vaping Use: Never used  Substance and Sexual Activity   Alcohol use: No   Drug use: Yes    Types: Other-see comments, Amphetamines   Sexual activity: Not on file  Other Topics Concern   Not on file  Social History Narrative   Not on file   Social Determinants of Health   Financial Resource Strain: Not on file  Food Insecurity: Not on file  Transportation Needs: Not on file  Physical Activity: Not on file  Stress: Not on file  Social Connections: Not on file   Additional Social History:                         Sleep: Fair  Appetite: okay  Current Medications: Current Facility-Administered Medications  Medication Dose Route Frequency Provider Last Rate Last Admin   acetaminophen (TYLENOL) tablet 650 mg  650 mg Oral Q6H PRN Derrill Center, NP       alum & mag hydroxide-simeth (MAALOX/MYLANTA) 200-200-20 MG/5ML suspension 30 mL  30 mL Oral Q4H PRN Derrill Center, NP       benztropine (COGENTIN) tablet 0.5 mg  0.5 mg Oral Q12H Bowie Doiron, MD   0.5 mg at 09/27/22 0830   Or   benztropine mesylate (COGENTIN) injection 0.5 mg  0.5 mg Intramuscular Q12H Gabryella Murfin, Ovid Curd, MD   0.5 mg at 09/26/22 1000   benztropine mesylate (COGENTIN) injection 2 mg  2 mg Intramuscular BID PRN Oliviagrace Crisanti, Ovid Curd, MD       haloperidol (HALDOL) tablet 5 mg  5 mg Oral TID PRN Janine Limbo, MD   5 mg at 09/23/22 1130   And   LORazepam (ATIVAN) tablet 2 mg  2 mg Oral TID PRN Janine Limbo, MD   2 mg at 09/23/22 1130   And   diphenhydrAMINE (BENADRYL) capsule 50 mg  50 mg Oral TID PRN Janine Limbo, MD   50 mg at 09/23/22 1130   haloperidol lactate (HALDOL) injection 5 mg  5 mg Intramuscular TID PRN  Ester Mabe, Ovid Curd, MD   5 mg at 09/24/22 1505   And   LORazepam (ATIVAN) injection 2 mg  2 mg Intramuscular TID PRN Leyla Soliz, Ovid Curd, MD   2 mg at 09/24/22 1506   And   diphenhydrAMINE (BENADRYL) injection 50 mg  50 mg Intramuscular TID PRN Janine Limbo, MD   50 mg at 09/24/22 1505   haloperidol (HALDOL) 2 MG/ML solution 10 mg  10 mg Oral Q12H Demetria Iwai, Ovid Curd, MD   10 mg at 09/27/22 0830  Or   haloperidol lactate (HALDOL) injection 5 mg  5 mg Intramuscular Q12H Epsie Walthall, Ovid Curd, MD   5 mg at 09/26/22 1000   hydrOXYzine (ATARAX) tablet 25 mg  25 mg Oral TID PRN Janine Limbo, MD   25 mg at 09/25/22 [id number]   magnesium hydroxide (MILK OF MAGNESIA) suspension 30 mL  30 mL Oral Daily PRN Derrill Center, NP       nicotine (NICODERM CQ - dosed in mg/24 hours) patch 14 mg  14 mg Transdermal Daily PRN Tycen Dockter, Ovid Curd, MD       traZODone (DESYREL) tablet 50 mg  50 mg Oral QHS PRN Derrill Center, NP       valproic acid (DEPAKENE) 250 MG/5ML solution 500 mg  500 mg Oral Q12H Khylan Sawyer, MD   500 mg at 09/27/22 0830    Lab Results: No results found for this or any previous visit (from the past 48 hour(s)).  Blood Alcohol level:  Lab Results  Component Value Date   ETH <10 09/18/2022   ETH <10 [id number]    Metabolic Disorder Labs: Lab Results  Component Value Date   HGBA1C 3.9 (L) 09/18/2022   MPG 65.23 09/18/2022   MPG 97 12/22/2016   Lab Results  Component Value Date   PROLACTIN 2.4 (L) 12/22/2016   PROLACTIN 8.0 09/09/2015   Lab Results  Component Value Date   CHOL 113 09/18/2022   TRIG 112 09/18/2022   HDL 46 09/18/2022   CHOLHDL 2.5 09/18/2022   VLDL 22 09/18/2022   LDLCALC 45 09/18/2022   LDLCALC 56 12/22/2016    Physical Findings: AIMS: Facial and Oral Movements Muscles of Facial Expression: None, normal Lips and Perioral Area: None, normal Jaw: None, normal Tongue: None, normal,Extremity Movements Upper (arms, wrists, hands, fingers):  None, normal Lower (legs, knees, ankles, toes): None, normal, Trunk Movements Neck, shoulders, hips: None, normal, Overall Severity Severity of abnormal movements (highest score from questions above): None, normal Incapacitation due to abnormal movements: None, normal Patient's awareness of abnormal movements (rate only patient's report): No Awareness, Dental Status Current problems with teeth and/or dentures?: No Does patient usually wear dentures?: No  CIWA:    COWS:     Musculoskeletal: Strength & Muscle Tone: within normal limits Gait & Station: normal Patient leans: N/A  Patient refuses physical exam today for testing any tremor, cogwheeling, rigidity.  Psychiatric Specialty Exam:  Presentation  General Appearance:  -- laying in bed  Eye Contact: Poor  Speech: Loud, demanding  Speech Volume: Loud   Handedness: -- (uncooperative, uta)   Mood and Affect  Mood: Irritable   Affect: Congruent   Thought Process  Thought Processes: -- (Tangential)  Descriptions of Associations:Tangential  Orientation:Partial  Thought Content:Linear   History of Schizophrenia/Schizoaffective disorder:Yes  Duration of Psychotic Symptoms:More  than six months  Hallucinations:No data recorded Denying AH, VH. Does not appear to be RTIS today.   Ideas of Reference:-- (Paranoid appearing) Suspicious   Suicidal Thoughts:No data recorded denies  Homicidal Thoughts:No data recorded Denies   Sensorium  Memory: -- (Refuses to answer)  Judgment: Poor  Insight: Lacking   Executive Functions  Concentration: Poor  Attention Span: Poor  Recall: Other (comment) (uncooperative, uta)  Fund of Knowledge: Other (comment) (uncooperative, uta)  Language: Other (comment) (uncooperative, uta)   Psychomotor Activity  Psychomotor Activity: No data recorded Walking the halls   Assets  Assets: Financial Resources/Insurance   Sleep  Sleep: No data  recorded Adequate    Physical Exam:  Physical Exam Vitals and nursing note reviewed.  Neurological:     Mental Status: He is alert.     Motor: No weakness.     Gait: Gait normal.    Review of Systems  Reason unable to perform ROS: Patient agitated, not participating in the exam.  Psychiatric/Behavioral:  Positive for hallucinations. The patient is nervous/anxious.    Blood pressure 131/74, pulse 93, temperature 98.4 F (36.9 C), temperature source Oral, resp. rate 20, weight 90.7 kg, SpO2 99 %. Body mass index is 36.58 kg/m.   Treatment Plan Summary:   **Patient has a legal guardian**   PLAN: Safety and Monitoring:             -- Involuntary admission to inpatient psychiatric unit for safety, stabilization and treatment             -- Daily contact with patient to assess and evaluate symptoms and progress in treatment             -- Patient's case to be discussed in multi-disciplinary team meeting             -- Observation Level : q15 minute checks             -- Vital signs:  q12 hours             -- Precautions: suicide, elopement, and assault   2. Psychiatric Diagnoses and Treatment:               -Continue Haldol 10 mg every 12 hours for psychotic disorder.  This is a forced medication.  Second opinion evaluation on 09-21-22, valid for 7 days. Ordered EKG on admission and reminded nursing to attempt to get this as soon it is safe to do this.  Last EKG was in 2021 -*No QTc prolongation.   It is my medical opinion that the patient would benefit from increased dose of Haldol, adding a second antipsychotic, or starting lithium (as proposed by the legal guardian) -we cannot do this at this time as the patient refuses to have an EKG.  -Continue Cogentin 0.5 mg every 12 hours for EPS prophylaxis -Continue Depakene liquid 500 mg q12H - for mood stability, irritability to and augment antipsychotic    -Continue Haldol oral and intramuscular PRN for agitation (combined with  Ativan and Benadryl)     --  The risks/benefits/side-effects/alternatives to this medication were discussed in detail with the patient and time was given for questions. The patient consents to medication trial.                -- Metabolic profile and EKG monitoring obtained while on an atypical antipsychotic (BMI: Lipid Panel: HbgA1c: QTc:)              -- Encouraged patient to participate in unit milieu and in scheduled group therapies                     3. Medical Issues Being Addressed:              Tobacco Use Disorder             -- Nicotine patch 14 mg/24 hours ordered             -- Smoking cessation encouraged   4. Discharge Planning:              -- Social work and case management to assist with discharge planning and identification of hospital follow-up needs  prior to discharge             -- Estimated LOS: 5-7 days             -- Discharge Concerns: Need to establish a safety plan; Medication compliance and effectiveness             -- Discharge Goals: Return home with outpatient referrals for mental health follow-up including medication management/psychotherapy    Total Time Spent in Direct Patient Care:  I personally spent 35 minutes on the unit in direct patient care. The direct patient care time included face-to-face time with the patient, reviewing the patient's chart, communicating with other professionals, and coordinating care. Greater than 50% of this time was spent in counseling or coordinating care with the patient regarding goals of hospitalization, psycho-education, and discharge planning needs.   Christoper Allegra, MD 09/27/2022, 12:13 PM

## 2022-09-27 NOTE — Plan of Care (Signed)

## 2022-09-27 NOTE — Progress Notes (Signed)
Adult Psychoeducational Group Note  Date:  09/27/2022 Time:  9:10 PM  Group Topic/Focus:  Wrap-Up Group:   The focus of this group is to help patients review their daily goal of treatment and discuss progress on daily workbooks.  Participation Level:  Did Not Attend  Participation Quality:   Did Not Attend  Affect:   Did Not Attend  Cognitive:   Did Not Attend  Insight: None  Engagement in Group:  None  Modes of Intervention:   Did Not Attend  Additional Comments:  Did Not Attend  Wende Crease 09/27/2022, 9:10 PM

## 2022-09-27 NOTE — BH IP Treatment Plan (Signed)
Interdisciplinary Treatment and Diagnostic Plan Update  09/27/2022 Time of Session: 8:30am Justin Chaney MRN: [id number]  Principal Diagnosis: Schizoaffective disorder, bipolar type ([name])  Secondary Diagnoses: Principal Problem:   Schizoaffective disorder, bipolar type ([name])   Current Medications:  Current Facility-Administered Medications  Medication Dose Route Frequency Provider Last Rate Last Admin   acetaminophen (TYLENOL) tablet 650 mg  650 mg Oral Q6H PRN Derrill Center, NP       alum & mag hydroxide-simeth (MAALOX/MYLANTA) 200-200-20 MG/5ML suspension 30 mL  30 mL Oral Q4H PRN Derrill Center, NP       benztropine (COGENTIN) tablet 0.5 mg  0.5 mg Oral Q12H Massengill, Nathan, MD   0.5 mg at 09/27/22 0830   Or   benztropine mesylate (COGENTIN) injection 0.5 mg  0.5 mg Intramuscular Q12H Massengill, Ovid Curd, MD   0.5 mg at 09/26/22 1000   benztropine mesylate (COGENTIN) injection 2 mg  2 mg Intramuscular BID PRN Massengill, Ovid Curd, MD       haloperidol (HALDOL) tablet 5 mg  5 mg Oral TID PRN Janine Limbo, MD   5 mg at 09/23/22 1130   And   LORazepam (ATIVAN) tablet 2 mg  2 mg Oral TID PRN Janine Limbo, MD   2 mg at 09/23/22 1130   And   diphenhydrAMINE (BENADRYL) capsule 50 mg  50 mg Oral TID PRN Janine Limbo, MD   50 mg at 09/23/22 1130   haloperidol lactate (HALDOL) injection 5 mg  5 mg Intramuscular TID PRN Janine Limbo, MD   5 mg at 09/24/22 1505   And   LORazepam (ATIVAN) injection 2 mg  2 mg Intramuscular TID PRN Janine Limbo, MD   2 mg at 09/24/22 1506   And   diphenhydrAMINE (BENADRYL) injection 50 mg  50 mg Intramuscular TID PRN Janine Limbo, MD   50 mg at 09/24/22 1505   haloperidol (HALDOL) 2 MG/ML solution 10 mg  10 mg Oral Q12H Massengill, Ovid Curd, MD   10 mg at 09/27/22 0830   Or   haloperidol lactate (HALDOL) injection 5 mg  5 mg Intramuscular Q12H Massengill, Ovid Curd, MD   5 mg at 09/26/22 1000   hydrOXYzine (ATARAX) tablet 25  mg  25 mg Oral TID PRN Janine Limbo, MD   25 mg at 09/25/22 [id number]   magnesium hydroxide (MILK OF MAGNESIA) suspension 30 mL  30 mL Oral Daily PRN Derrill Center, NP       nicotine (NICODERM CQ - dosed in mg/24 hours) patch 14 mg  14 mg Transdermal Daily PRN Massengill, Ovid Curd, MD       traZODone (DESYREL) tablet 50 mg  50 mg Oral QHS PRN Derrill Center, NP       valproic acid (DEPAKENE) 250 MG/5ML solution 500 mg  500 mg Oral Q12H Massengill, Nathan, MD   500 mg at 09/27/22 0830   PTA Medications: No medications prior to admission.    Patient Stressors:    Patient Strengths:    Treatment Modalities: Medication Management, Group therapy, Case management,  1 to 1 session with clinician, Psychoeducation, Recreational therapy.   Physician Treatment Plan for Primary Diagnosis: Schizoaffective disorder, bipolar type ([name]) Long Term Goal(s): Improvement in symptoms so as ready for discharge   Short Term Goals: Ability to identify changes in lifestyle to reduce recurrence of condition will improve Ability to verbalize feelings will improve Ability to disclose and discuss suicidal ideas Ability to demonstrate self-control will improve Ability to identify and develop effective coping behaviors  will improve Ability to maintain clinical measurements within normal limits will improve Compliance with prescribed medications will improve Ability to identify triggers associated with substance abuse/mental health issues will improve  Medication Management: Evaluate patient's response, side effects, and tolerance of medication regimen.  Therapeutic Interventions: 1 to 1 sessions, Unit Group sessions and Medication administration.  Evaluation of Outcomes: Progressing  Physician Treatment Plan for Secondary Diagnosis: Principal Problem:   Schizoaffective disorder, bipolar type ([name])  Long Term Goal(s): Improvement in symptoms so as ready for discharge   Short Term Goals: Ability to  identify changes in lifestyle to reduce recurrence of condition will improve Ability to verbalize feelings will improve Ability to disclose and discuss suicidal ideas Ability to demonstrate self-control will improve Ability to identify and develop effective coping behaviors will improve Ability to maintain clinical measurements within normal limits will improve Compliance with prescribed medications will improve Ability to identify triggers associated with substance abuse/mental health issues will improve     Medication Management: Evaluate patient's response, side effects, and tolerance of medication regimen.  Therapeutic Interventions: 1 to 1 sessions, Unit Group sessions and Medication administration.  Evaluation of Outcomes: Progressing   RN Treatment Plan for Primary Diagnosis: Schizoaffective disorder, bipolar type ([name]) Long Term Goal(s): Knowledge of disease and therapeutic regimen to maintain health will improve  Short Term Goals: Ability to remain free from injury will improve, Ability to verbalize frustration and anger appropriately will improve, Ability to demonstrate self-control, Ability to participate in decision making will improve, Ability to verbalize feelings will improve, Ability to disclose and discuss suicidal ideas, Ability to identify and develop effective coping behaviors will improve, and Compliance with prescribed medications will improve  Medication Management: RN will administer medications as ordered by provider, will assess and evaluate patient's response and provide education to patient for prescribed medication. RN will report any adverse and/or side effects to prescribing provider.  Therapeutic Interventions: 1 on 1 counseling sessions, Psychoeducation, Medication administration, Evaluate responses to treatment, Monitor vital signs and CBGs as ordered, Perform/monitor CIWA, COWS, AIMS and Fall Risk screenings as ordered, Perform wound care treatments as  ordered.  Evaluation of Outcomes: Progressing   LCSW Treatment Plan for Primary Diagnosis: Schizoaffective disorder, bipolar type ([name]) Long Term Goal(s): Safe transition to appropriate next level of care at discharge, Engage patient in therapeutic group addressing interpersonal concerns.  Short Term Goals: Engage patient in aftercare planning with referrals and resources, Increase social support, Increase ability to appropriately verbalize feelings, Increase emotional regulation, Facilitate acceptance of mental health diagnosis and concerns, Facilitate patient progression through stages of change regarding substance use diagnoses and concerns, Identify triggers associated with mental health/substance abuse issues, and Increase skills for wellness and recovery  Therapeutic Interventions: Assess for all discharge needs, 1 to 1 time with Social worker, Explore available resources and support systems, Assess for adequacy in community support network, Educate family and significant other(s) on suicide prevention, Complete Psychosocial Assessment, Interpersonal group therapy.  Evaluation of Outcomes: Progressing   Progress in Treatment: Attending groups: No. Participating in groups: No. Taking medication as prescribed: Yes. Pt is on forced medications Toleration medication: Yes. Family/Significant other contact made: No, will contact:  Legal Guardian [employer] Delaney Meigs ([place] legal guardian), [phone] Patient understands diagnosis: No. Discussing patient identified problems/goals with staff: No. Medical problems stabilized or resolved: Yes. Denies suicidal/homicidal ideation: Yes. Issues/concerns per patient self-inventory: No.   New problem(s) identified: No, Describe:  none reported  New Short Term/Long Term Goal(s):   medication stabilization, elimination of  SI thoughts, development of comprehensive mental wellness plan.     Patient Goals:  Pt continues to work on goals stated at  initial tx team.    Discharge Plan or Barriers: Patient is connected to med management and therapy.  Reason for Continuation of Hospitalization: Anxiety Depression Medication stabilization Other; describe psychosis   Estimated Length of Stay: 3-7 days  Last 3 [place] Suicide Severity Risk Score: [place] ED from [facility]/22/2022 in [facility] Department at [facility] ED from 11/14/2021 in [facility] Emergency Department at [facility] ED from 11/09/2021 in [facility] Emergency Department at [place] No Risk No Risk No Risk       Last PHQ 2/9 Scores:     No data to display          Scribe for Treatment Team: Zachery Conch, LCSW 09/27/2022 2:55 PM

## 2022-09-27 NOTE — Group Note (Signed)
Recreation Therapy Group Note   Group Topic:Self-Esteem  Group Date: 09/27/2022 Start Time: 1000 End Time: 1050 Facilitators: Lesley Galentine-McCall, LRT,CTRS Location: [address] Dayroom   Goal Area(s) Addresses:  Patient will identify and write at least one positive trait about themself. Patient will successfully identify positive traits about themself. Patient will acknowledge the benefit of healthy self-esteem. Patient will endorse understanding of ways to increase self-esteem.      Group Description:  LRT began group session with open dialogue asking the patients to define self-esteem and verbally identify positive qualities and traits people may possess. Patients were then instructed to design a personalized license plate, with words and drawings, representing at least 3 positive things about themselves. Pts were encouraged to include favorites, things they are proud of, what they enjoy doing, and dreams for their future. If a patient had a life motto or a meaningful phase that expressed their life values, pt's were asked to incorporate that into their design as well. Patients were given the opportunity to share their completed work with the group.   Affect/Mood: N/A   Participation Level: Did not attend    Clinical Observations/Individualized Feedback:     Plan: Continue to engage patient in RT group sessions 2-3x/week.   Marcos Peloso-McCall, LRT,CTRS 09/27/2022 1:36 PM

## 2022-09-28 MED ORDER — HALOPERIDOL DECANOATE 100 MG/ML IM SOLN
100.0000 mg | Freq: Once | INTRAMUSCULAR | Status: AC
  Administered 2022-09-28: 100 mg via INTRAMUSCULAR
  Filled 2022-09-28: qty 1

## 2022-09-28 NOTE — Group Note (Signed)
Date:  09/28/2022 Time:  2:21 PM  Group Topic/Focus:  Wellness Toolbox:   The focus of this group is to discuss various aspects of wellness, balancing those aspects and exploring ways to increase the ability to experience wellness.  Patients will create a wellness toolbox for use upon discharge.    Participation Level:  Did Not Attend   Iona Coach Charonda Hefter [id number], 2:21 PM

## 2022-09-28 NOTE — Group Note (Signed)
Recreation Therapy Group Note   Group Topic:Health and Wellness  Group Date: 09/28/2022 Start Time: 1000 End Time: 1030 Facilitators: Teo Moede-McCall, LRT,CTRS Location: [address] Dayroom   Goal Area(s) Addresses:  Patient will actively participate in selected exercise video on Youtube.  Patient will verbalize benefit of exercise during group session. Patient will acknowledge benefits of exercise when used as a coping mechanism.   Group Description:  LRT and patients discussed the importance of exercise and how it works with mental and spiritual health.  LRT then selected a cardio video for beginners on Youtube.  LRT and patients were to follow with the 20 minute video as it played.  Patients were encouraged to take breaks and get water if needed.   Affect/Mood: N/A   Participation Level: Did not attend    Clinical Observations/Individualized Feedback:      Plan: Continue to engage patient in RT group sessions 2-3x/week.   Santasia Rew-McCall, LRT,CTRS 09/28/2022 10:47 AM

## 2022-09-28 NOTE — Group Note (Signed)
Date:  09/28/2022 Time:  11:53 AM  Group Topic/Focus:  Emotional Education:   The focus of this group is to discuss what feelings/emotions are, and how they are experienced.    Participation Level:  Did Not Attend   Iona Coach Trinidee Schrag [id number], 11:53 AM

## 2022-09-28 NOTE — Progress Notes (Signed)
Pt was encouraged but refused to attend group discussion

## 2022-09-28 NOTE — Progress Notes (Signed)
[facility] MD Progress Note  09/28/2022 10:44 AM Justin Chaney  MRN:  [id number]  Subjective:     Patient is a 27 year old male with a documented psychiatric history of schizoaffective disorder bipolar type, presented to the [facility] under IVC with police escort.     My exam today, the patient more interactive.  He continues to deny that he has any mental illness or needs medications outside the hospital.  I discussed with him again that his legal guardian says that he should be on the [place] prior to being discharged from the hospital.  Patient is ultimately agreeable to take the [place] today.  He is still not willing to get an EKG for cardiac monitoring.  He still does not believe that he has a legal guardian, and I again discussed with him that he does have legal guardian, is appointed by the state.  Patient otherwise denies any side effects to current medications.  He refuses physical exam for eps.  He denies having any AH, VH, paranoia, delusions.  Does not appear to be responding to external stimuli on my exam.  Denies any SI or HI.  Per nursing, the patient continues to nursing that he will take his medications in the hospital but does not plan to take medications once he is discharged from the hospital.  According to nursing yesterday the patient was responding to internal stimuli when stressed and overwhelmed about discharge discussion yesterday.  He is not pervasively or consistently responding to internal stimuli throughout the day.   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   Depression    Homelessness    Schizophrenia ([place])    Seizures ([place])    childhood seizures   Suicidal behavior    Suicidal intent     Past Surgical History:  Procedure Laterality Date   APPENDECTOMY     SHUNT REMOVAL     TEE WITHOUT CARDIOVERSION N/A 08/14/2019   Procedure: TRANSESOPHAGEAL ECHOCARDIOGRAM (TEE);  Surgeon: Pixie Casino, MD;  Location: [facility] ENDOSCOPY;  Service: Cardiovascular;  Laterality: N/A;   TONSILLECTOMY     VENTRICULO-PERITONEAL SHUNT PLACEMENT / LAPAROSCOPIC INSERTION PERITONEAL CATHETER     Family History:  Family History  Problem Relation Age of Onset   Hypertension Mother    Mental illness Neg Hx    Family Psychiatric  History:  Patient refused to discuss all aspects of this    Social History:  Social History   Substance and Sexual Activity  Alcohol Use No     Social History   Substance and Sexual Activity  Drug Use Yes   Types: Other-see comments, Amphetamines    Social History   Socioeconomic History   Marital status: Single    Spouse name: Not on file   Number of children: Not on file   Years of education: Not on file   Highest education level: Not on file  Occupational History   Not on file  Tobacco Use   Smoking status: Every Day    Packs/day: 0.00    Types: Cigars, Cigarettes   Smokeless tobacco: Never  Vaping Use   Vaping  Use: Never used  Substance and Sexual Activity   Alcohol use: No   Drug use: Yes    Types: Other-see comments, Amphetamines   Sexual activity: Not on file  Other Topics Concern   Not on file  Social History Narrative   Not on file   Social Determinants of Health   Financial Resource Strain: Not on file  Food Insecurity: Not on file  Transportation Needs: Not on file  Physical Activity: Not on file  Stress: Not on file  Social Connections: Not on file   Additional Social History:                         Sleep: Fair  Appetite: okay  Current  Medications: Current Facility-Administered Medications  Medication Dose Route Frequency Provider Last Rate Last Admin   acetaminophen (TYLENOL) tablet 650 mg  650 mg Oral Q6H PRN Derrill Center, NP       alum & mag hydroxide-simeth (MAALOX/MYLANTA) 200-200-20 MG/5ML suspension 30 mL  30 mL Oral Q4H PRN Derrill Center, NP       benztropine (COGENTIN) tablet 0.5 mg  0.5 mg Oral Q12H Briggs Edelen, MD   0.5 mg at 09/28/22 [id number]   Or   benztropine mesylate (COGENTIN) injection 0.5 mg  0.5 mg Intramuscular Q12H Kiyomi Pallo, Ovid Curd, MD   0.5 mg at 09/26/22 1000   benztropine mesylate (COGENTIN) injection 2 mg  2 mg Intramuscular BID PRN Aleia Larocca, Ovid Curd, MD       haloperidol (HALDOL) tablet 5 mg  5 mg Oral TID PRN Janine Limbo, MD   5 mg at 09/23/22 1130   And   LORazepam (ATIVAN) tablet 2 mg  2 mg Oral TID PRN Janine Limbo, MD   2 mg at 09/23/22 1130   And   diphenhydrAMINE (BENADRYL) capsule 50 mg  50 mg Oral TID PRN Janine Limbo, MD   50 mg at 09/23/22 1130   haloperidol lactate (HALDOL) injection 5 mg  5 mg Intramuscular TID PRN Janine Limbo, MD   5 mg at 09/24/22 1505   And   LORazepam (ATIVAN) injection 2 mg  2 mg Intramuscular TID PRN Janine Limbo, MD   2 mg at 09/24/22 1506   And   diphenhydrAMINE (BENADRYL) injection 50 mg  50 mg Intramuscular TID PRN Janine Limbo, MD   50 mg at 09/24/22 1505   haloperidol (HALDOL) 2 MG/ML solution 10 mg  10 mg Oral Q12H Castulo Scarpelli, Ovid Curd, MD   10 mg at 09/28/22 [id number]   Or   haloperidol lactate (HALDOL) injection 5 mg  5 mg Intramuscular Q12H Ioana Louks, Ovid Curd, MD   5 mg at 09/26/22 1000   haloperidol decanoate (HALDOL DECANOATE) 100 MG/ML injection 100 mg  100 mg Intramuscular Once Rjay Revolorio, Ovid Curd, MD       hydrOXYzine (ATARAX) tablet 25 mg  25 mg Oral TID PRN Janine Limbo, MD   25 mg at 09/25/22 [id number]   magnesium hydroxide (MILK OF MAGNESIA) suspension 30 mL  30 mL Oral Daily PRN Derrill Center, NP        nicotine (NICODERM CQ - dosed in mg/24 hours) patch 14 mg  14 mg Transdermal Daily PRN Lynessa Almanzar, Ovid Curd, MD       traZODone (DESYREL) tablet 50 mg  50 mg Oral QHS PRN Derrill Center, NP       valproic acid (DEPAKENE) 250 MG/5ML solution 500 mg  500 mg Oral Q12H Gwendalynn Eckstrom, Ovid Curd, MD   500  mg at 09/28/22 0815    Lab Results: No results found for this or any previous visit (from the past 48 hour(s)).  Blood Alcohol level:  Lab Results  Component Value Date   ETH <10 09/18/2022   ETH <10 [id number]    Metabolic Disorder Labs: Lab Results  Component Value Date   HGBA1C 3.9 (L) 09/18/2022   MPG 65.23 09/18/2022   MPG 97 12/22/2016   Lab Results  Component Value Date   PROLACTIN 2.4 (L) 12/22/2016   PROLACTIN 8.0 09/09/2015   Lab Results  Component Value Date   CHOL 113 09/18/2022   TRIG 112 09/18/2022   HDL 46 09/18/2022   CHOLHDL 2.5 09/18/2022   VLDL 22 09/18/2022   LDLCALC 45 09/18/2022   LDLCALC 56 12/22/2016    Physical Findings: AIMS: Facial and Oral Movements Muscles of Facial Expression: None, normal Lips and Perioral Area: None, normal Jaw: None, normal Tongue: None, normal,Extremity Movements Upper (arms, wrists, hands, fingers): None, normal Lower (legs, knees, ankles, toes): None, normal, Trunk Movements Neck, shoulders, hips: None, normal, Overall Severity Severity of abnormal movements (highest score from questions above): None, normal Incapacitation due to abnormal movements: None, normal Patient's awareness of abnormal movements (rate only patient's report): No Awareness, Dental Status Current problems with teeth and/or dentures?: No Does patient usually wear dentures?: No  CIWA:    COWS:     Musculoskeletal: Strength & Muscle Tone: within normal limits Gait & Station: normal Patient leans: N/A  Patient refuses physical exam today for testing any tremor, cogwheeling, rigidity.  Psychiatric Specialty Exam:  Presentation  General  Appearance:  -- laying in bed  Eye Contact: Poor  Speech: Demanding  Speech Volume: nml   Handedness: -- (uncooperative, uta)   Mood and Affect  Mood: Irritable   Affect: Congruent   Thought Process  Thought Processes: -- (Tangential)  Descriptions of Associations:More linear  Orientation:Partial  Thought Content:Linear   History of Schizophrenia/Schizoaffective disorder:Yes  Duration of Psychotic Symptoms:More  than six months  Hallucinations:No data recorded Denying AH, VH. Does not appear to be RTIS during my exam today.   Ideas of Reference:-- (Paranoid appearing) Suspicious   Suicidal Thoughts:No data recorded Denies  Homicidal Thoughts:No data recorded Denies   Sensorium  Memory: -- (Refuses to answer)  Judgment: Poor  Insight: Lacking   Executive Functions  Concentration: Poor  Attention Span: Poor  Recall: Other (comment) (uncooperative, uta)  Fund of Knowledge: Other (comment) (uncooperative, uta)  Language: Other (comment) (uncooperative, uta)   Psychomotor Activity  Psychomotor Activity: No data recorded Walking the halls   Assets  Assets: Financial Resources/Insurance   Sleep  Sleep: No data recorded Adequate    Physical Exam: Physical Exam Vitals and nursing note reviewed.  Neurological:     Mental Status: He is alert.     Motor: No weakness.     Gait: Gait normal.    Review of Systems  Reason unable to perform ROS: Patient agitated, not participating in the exam.  Psychiatric/Behavioral:  Positive for hallucinations. The patient is nervous/anxious.    Blood pressure 103/76, pulse (!) 101, temperature 98.3 F (36.8 C), temperature source Oral, resp. rate 20, weight 90.7 kg, SpO2 99 %. Body mass index is 36.58 kg/m.   Treatment Plan Summary:   **Patient has a legal guardian**   PLAN: Safety and Monitoring:             -- Involuntary admission to inpatient psychiatric unit for safety,  stabilization and treatment             --  Daily contact with patient to assess and evaluate symptoms and progress in treatment             -- Patient's case to be discussed in multi-disciplinary team meeting             -- Observation Level : q15 minute checks             -- Vital signs:  q12 hours             -- Precautions: suicide, elopement, and assault   2. Psychiatric Diagnoses and Treatment:               -Continue Haldol 10 mg every 12 hours for psychotic disorder.  Ordered EKG on admission and reminded nursing to attempt to get this as soon it is safe to do this.  Last EKG was in 2021 -*No QTc prolongation. Pt continues to refuse to get EKG and explained the risk of haldol w/o cardiac monitoring including arrhthymia and death.  -Give Haldol dec 100 mg IM LAI today for psychotic disorder. Next dose of 100 mg LAI is due in 3-7 days (for a total dose of haldol dec 200 mg LAI qmonth)  It is my medical opinion that the patient would benefit from increased dose of Haldol, adding a second antipsychotic, or starting lithium (as proposed by the legal guardian) -we cannot do this at this time as the patient refuses to have an EKG.  -Continue Cogentin 0.5 mg every 12 hours for EPS prophylaxis -Continue Depakene liquid 500 mg q12H - for mood stability, irritability to and augment antipsychotic    -Continue Haldol oral and intramuscular PRN for agitation (combined with Ativan and Benadryl)     --  The risks/benefits/side-effects/alternatives to this medication were discussed in detail with the patient and time was given for questions. The patient consents to medication trial.                -- Metabolic profile and EKG monitoring obtained while on an atypical antipsychotic (BMI: Lipid Panel: HbgA1c: QTc:)              -- Encouraged patient to participate in unit milieu and in scheduled group therapies                     3. Medical Issues Being Addressed:              Tobacco Use Disorder              -- Nicotine patch 14 mg/24 hours ordered             -- Smoking cessation encouraged   4. Discharge Planning:              -- Social work and case management to assist with discharge planning and identification of hospital follow-up needs prior to discharge             -- Estimated LOS: 5-7 days             -- Discharge Concerns: Need to establish a safety plan; Medication compliance and effectiveness             -- Discharge Goals: Return home with outpatient referrals for mental health follow-up including medication management/psychotherapy    Total Time Spent in Direct Patient Care:  I personally spent 359mnutes on the unit in direct patient care. The direct patient care time included face-to-face time with the patient, reviewing the patient's chart,  communicating with other professionals, and coordinating care. Greater than 50% of this time was spent in counseling or coordinating care with the patient regarding goals of hospitalization, psycho-education, and discharge planning needs.   Christoper Allegra, MD 09/28/2022, 10:44 AM

## 2022-09-28 NOTE — Progress Notes (Signed)
Pt presents irritable with blunted affect, intense eye contact and demanding discharge post scheduled medications "I need to leave now. Y'all told me I can go after I get the medicines". Required multiple redirections and verbal education on discharge process. Remains isolative to his room. Haldol Dec administered as ordered. Emotional support, encouragement and reassurance offered. Safety checks maintained at Q 15 minutes intervals. Declined supper when offered "I don't want it". Tolerates breakfast, lunch medications and fluids well. Safety maintained in milieu.

## 2022-09-28 NOTE — Group Note (Signed)
Date:  09/28/2022 Time:  9:54 AM  Group Topic/Focus:  Goals Group:   The focus of this group is to help patients establish daily goals to achieve during treatment and discuss how the patient can incorporate goal setting into their daily lives to aide in recovery.    Participation Level:  Did Not Attend   Iona Coach Salvador Bigbee [id number], 9:54 AM

## 2022-09-28 NOTE — Progress Notes (Signed)
   09/28/22 2123  Psych Admission Type (Psych Patients Only)  Admission Status Involuntary  Psychosocial Assessment  Patient Complaints Anxiety;Worrying  Eye Contact Fair  Facial Expression Flat  Affect Preoccupied  Speech Logical/coherent  Interaction Guarded;Isolative;Minimal  Appearance/Hygiene Disheveled  Behavior Characteristics Cooperative  Mood Preoccupied  Thought Process  Coherency Disorganized  Content Preoccupation  Delusions Paranoid  Perception Hallucinations  Hallucination Auditory  Judgment Poor  Confusion None  Danger to Self  Current suicidal ideation? Denies  Agreement Not to Harm Self Yes  Description of Agreement verbal contract  Danger to Others  Danger to Others None reported or observed   D: Patient isolates mostly to his room. Pt appears to be talking and responding to himself. Pt appeared calm talking to Probation officer an answered all questions. Medication administered without any issue. Pt requested a meal tray which was provided.  A: Medications administered as prescribed. Support and encouragement provided as needed.  R: Patient remains safe on the unit. Plan of care ongoing for safety and stability.

## 2022-09-29 NOTE — Progress Notes (Signed)
Adult Psychoeducational Group Note  Date:  09/29/2022 Time:  9:05 PM  Group Topic/Focus:  Wrap-Up Group:   The focus of this group is to help patients review their daily goal of treatment and discuss progress on daily workbooks.  Participation Level:  Did Not Attend  Participation Quality:   Did Not Attend  Affect:   Did Not Attend  Cognitive:   Did Not Attend  Insight: None  Engagement in Group:   Did Not Attend  Modes of Intervention:   Did Not Attend  Additional Comments:  Did Not Attend  Wende Crease 09/29/2022, 9:05 PM

## 2022-09-29 NOTE — Group Note (Signed)
Recreation Therapy Group Note   Group Topic:Team Building  Group Date: 09/29/2022 Start Time: 1020 End Time: 1050 Facilitators: Arlyn Bumpus-McCall, LRT,CTRS Location: 500 Hall Dayroom   Goal Area(s) Addresses:  Patient will effectively work with peer towards shared goal.  Patient will identify skills used to make activity successful.  Patient will identify how skills used during activity can be used to reach post d/c goals.   Group Description: Straw Bridge. In teams of 3-5, patients were given 15 plastic drinking straws and an equal length of masking tape. Using the materials provided, patients were instructed to build a free standing bridge-like structure to suspend an everyday item (ex: puzzle box) off of the floor or table surface. All materials were required to be used by the team in their design. LRT facilitated post-activity discussion reviewing team process. Patients were encouraged to reflect how the skills used in this activity can be generalized to daily life post discharge.    Affect/Mood: N/A   Participation Level: Did not attend    Clinical Observations/Individualized Feedback:     Plan: Continue to engage patient in RT group sessions 2-3x/week.   Charlee Squibb-McCall, LRT,CTRS  09/29/2022 1:17 PM

## 2022-09-29 NOTE — Progress Notes (Signed)
   09/29/22 0545  15 Minute Checks  Location Bedroom  Visual Appearance Calm  Behavior Sleeping  Sleep (Behavioral Health Patients Only)  Calculate sleep? (Click Yes once per 24 hr at 0600 safety check) Yes  Documented sleep last 24 hours 9.25

## 2022-09-29 NOTE — Progress Notes (Signed)
[facility] MD Progress Note  09/29/2022 11:21 AM Justin Chaney  MRN:  [id number]  Subjective:     Patient is a 27 year old male with a documented psychiatric history of schizoaffective disorder bipolar type, presented to the [facility] under IVC with police escort.     My exam today, the patient more interactive.  He continues to deny that he has any mental illness or needs medications outside the hospital.  He did accept the [place] yesterday and denies having any adverse reactions or injection site reactions after receiving the LAI.  He again refuses physical exam for EPS.  He is again encouraged to get EKG for cardiac monitoring, and nurse was able to obtain this later in the morning after my with the patient.  He otherwise denies having side effects to oral Haldol or oral Depakote.  Per nursing he continues to respond to internal stimuli off and on.  It is my thinking the patient is likely return to baseline.  It is possible the patient's psychosis could be further treated if he were tried on Clozapine, but due to the patient's self admission of his plan for nonadherence outside of the hospital, homelessness, limited access to psychiatric care, and limited support, prescribing of clozapine would be challenging. He could need to deny having AH and VH.  Continues to deny having paranoia or delusions.  Denies any SI or HI.  Reports that mood is fine.  He is objectively irritable.  Reports that anxiety level is low.  He continues to be fixated on discharge.  Denies any tremor or stiffness, as noted above he refuses the physical exam for EPS.  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   Depression    Homelessness    Schizophrenia ([place])    Seizures ([place])    childhood seizures   Suicidal behavior    Suicidal intent     Past Surgical History:  Procedure Laterality Date   APPENDECTOMY     SHUNT REMOVAL     TEE WITHOUT CARDIOVERSION N/A 08/14/2019   Procedure: TRANSESOPHAGEAL ECHOCARDIOGRAM (TEE);  Surgeon: Pixie Casino, MD;  Location: [facility] ENDOSCOPY;  Service: Cardiovascular;  Laterality: N/A;   TONSILLECTOMY     VENTRICULO-PERITONEAL SHUNT PLACEMENT / LAPAROSCOPIC INSERTION PERITONEAL CATHETER     Family History:  Family History  Problem Relation Age of Onset   Hypertension Mother    Mental illness Neg Hx    Family Psychiatric  History:  Patient refused to discuss all aspects of this    Social History:  Social History   Substance and Sexual Activity  Alcohol Use No     Social History   Substance and Sexual Activity  Drug Use Yes   Types: Other-see comments, Amphetamines    Social History   Socioeconomic History   Marital status: Single    Spouse name: Not on file   Number of children: Not on file   Years of education: Not on file   Highest education level: Not on file  Occupational History   Not on file  Tobacco Use   Smoking status: Every Day    Packs/day:  0.00    Types: Cigars, Cigarettes   Smokeless tobacco: Never  Vaping Use   Vaping Use: Never used  Substance and Sexual Activity   Alcohol use: No   Drug use: Yes    Types: Other-see comments, Amphetamines   Sexual activity: Not on file  Other Topics Concern   Not on file  Social History Narrative   Not on file   Social Determinants of Health   Financial Resource Strain: Not on file  Food Insecurity: Not on file  Transportation Needs: Not on file  Physical Activity: Not on file  Stress: Not on file  Social Connections: Not on file   Additional Social  History:                         Sleep: Fair  Appetite: okay  Current Medications: Current Facility-Administered Medications  Medication Dose Route Frequency Provider Last Rate Last Admin   acetaminophen (TYLENOL) tablet 650 mg  650 mg Oral Q6H PRN Derrill Center, NP       alum & mag hydroxide-simeth (MAALOX/MYLANTA) 200-200-20 MG/5ML suspension 30 mL  30 mL Oral Q4H PRN Derrill Center, NP       benztropine (COGENTIN) tablet 0.5 mg  0.5 mg Oral Q12H Cyndal Kasson, MD   0.5 mg at 09/29/22 [id number]   Or   benztropine mesylate (COGENTIN) injection 0.5 mg  0.5 mg Intramuscular Q12H Mkenzie Dotts, Ovid Curd, MD   0.5 mg at 09/26/22 1000   benztropine mesylate (COGENTIN) injection 2 mg  2 mg Intramuscular BID PRN Kamya Watling, Ovid Curd, MD       haloperidol (HALDOL) tablet 5 mg  5 mg Oral TID PRN Janine Limbo, MD   5 mg at 09/23/22 1130   And   LORazepam (ATIVAN) tablet 2 mg  2 mg Oral TID PRN Janine Limbo, MD   2 mg at 09/23/22 1130   And   diphenhydrAMINE (BENADRYL) capsule 50 mg  50 mg Oral TID PRN Janine Limbo, MD   50 mg at 09/23/22 1130   haloperidol lactate (HALDOL) injection 5 mg  5 mg Intramuscular TID PRN Janine Limbo, MD   5 mg at 09/24/22 1505   And   LORazepam (ATIVAN) injection 2 mg  2 mg Intramuscular TID PRN Janine Limbo, MD   2 mg at 09/24/22 1506   And   diphenhydrAMINE (BENADRYL) injection 50 mg  50 mg Intramuscular TID PRN Janine Limbo, MD   50 mg at 09/24/22 1505   haloperidol (HALDOL) 2 MG/ML solution 10 mg  10 mg Oral Q12H Dekari Bures, Ovid Curd, MD   10 mg at 09/29/22 [id number]   Or   haloperidol lactate (HALDOL) injection 5 mg  5 mg Intramuscular Q12H Marijane Trower, Ovid Curd, MD   5 mg at 09/26/22 1000   hydrOXYzine (ATARAX) tablet 25 mg  25 mg Oral TID PRN Janine Limbo, MD   25 mg at 09/25/22 [id number]   magnesium hydroxide (MILK OF MAGNESIA) suspension 30 mL  30 mL Oral Daily PRN Derrill Center, NP       nicotine (NICODERM CQ - dosed in  mg/24 hours) patch 14 mg  14 mg Transdermal Daily PRN Brienne Liguori, Ovid Curd, MD       traZODone (DESYREL) tablet 50 mg  50 mg Oral QHS PRN Derrill Center, NP       valproic acid (DEPAKENE) 250 MG/5ML solution 500 mg  500 mg Oral Q12H Kemond Amorin, Ovid Curd, MD   500 mg at 09/29/22 [phone]  Lab Results: No results found for this or any previous visit (from the past 48 hour(s)).  Blood Alcohol level:  Lab Results  Component Value Date   ETH <10 09/18/2022   ETH <10 [id number]    Metabolic Disorder Labs: Lab Results  Component Value Date   HGBA1C 3.9 (L) 09/18/2022   MPG 65.23 09/18/2022   MPG 97 12/22/2016   Lab Results  Component Value Date   PROLACTIN 2.4 (L) 12/22/2016   PROLACTIN 8.0 09/09/2015   Lab Results  Component Value Date   CHOL 113 09/18/2022   TRIG 112 09/18/2022   HDL 46 09/18/2022   CHOLHDL 2.5 09/18/2022   VLDL 22 09/18/2022   LDLCALC 45 09/18/2022   LDLCALC 56 12/22/2016    Physical Findings: AIMS: Facial and Oral Movements Muscles of Facial Expression: None, normal Lips and Perioral Area: None, normal Jaw: None, normal Tongue: None, normal,Extremity Movements Upper (arms, wrists, hands, fingers): None, normal Lower (legs, knees, ankles, toes): None, normal, Trunk Movements Neck, shoulders, hips: None, normal, Overall Severity Severity of abnormal movements (highest score from questions above): None, normal Incapacitation due to abnormal movements: None, normal Patient's awareness of abnormal movements (rate only patient's report): No Awareness, Dental Status Current problems with teeth and/or dentures?: No Does patient usually wear dentures?: No  CIWA:    COWS:     Musculoskeletal: Strength & Muscle Tone: within normal limits Gait & Station: normal Patient leans: N/A  Patient refuses physical exam today for testing any tremor, cogwheeling, rigidity.  Psychiatric Specialty Exam:  Presentation  General Appearance:  -- laying in bed  Eye  Contact: Poor  Speech: Demanding  Speech Volume: nml   Handedness: -- (uncooperative, uta)   Mood and Affect  Mood: Irritable   Affect: Congruent   Thought Process  Thought Processes: -- (Tangential)  Descriptions of Associations:More linear  Orientation:Partial  Thought Content:Linear   History of Schizophrenia/Schizoaffective disorder:Yes  Duration of Psychotic Symptoms:More  than six months  Hallucinations:No data recorded Denying AH, VH. Does not appear to be RTIS during my exam today.   Ideas of Reference:-- (Paranoid appearing) Suspicious   Suicidal Thoughts:No data recorded Denies  Homicidal Thoughts:No data recorded Denies   Sensorium  Memory: -- (Refuses to answer)  Judgment: Poor  Insight: Lacking   Executive Functions  Concentration: Poor  Attention Span: Poor  Recall: Other (comment) (uncooperative, uta)  Fund of Knowledge: Other (comment) (uncooperative, uta)  Language: Other (comment) (uncooperative, uta)   Psychomotor Activity  Psychomotor Activity: No data recorded Walking the halls   Assets  Assets: Financial Resources/Insurance   Sleep  Sleep: No data recorded Adequate    Physical Exam: Physical Exam Vitals and nursing note reviewed.  Neurological:     Mental Status: He is alert.     Motor: No weakness.     Gait: Gait normal.    Review of Systems  Reason unable to perform ROS: Patient agitated, not participating in the exam.  Psychiatric/Behavioral:  Positive for hallucinations. The patient is nervous/anxious.    Blood pressure 122/89, pulse 83, temperature 97.8 F (36.6 C), temperature source Oral, resp. rate 16, weight 90.7 kg, SpO2 99 %. Body mass index is 36.58 kg/m.   Treatment Plan Summary:   **Patient has a legal guardian**   PLAN: Safety and Monitoring:             -- Involuntary admission to inpatient psychiatric unit for safety, stabilization and treatment             --  Daily contact with patient to assess and evaluate symptoms and progress in treatment             -- Patient's case to be discussed in multi-disciplinary team meeting             -- Observation Level : q15 minute checks             -- Vital signs:  q12 hours             -- Precautions: suicide, elopement, and assault   2. Psychiatric Diagnoses and Treatment:               -Continue Haldol 10 mg every 12 hours for psychotic disorder.  EKG today, 2/28 had QTc WNL. -Give Haldol dec 100 mg IM LAI on 09-29-22 for psychotic disorder. Next dose of 100 mg LAI is due in 3-7 days (for a total dose of haldol dec 200 mg LAI qmonth)  -Patient is declining starting any other medication such as a second antipsychotic or lithium, for further treatment of his psychosis and mood instability.  He believes he does not have any psychiatric illness and does not require any treatment with psychiatric medication.  -Continue Cogentin 0.5 mg every 12 hours for EPS prophylaxis -Continue Depakene liquid 500 mg q12H - for mood stability, irritability to and augment antipsychotic    -Continue Haldol oral and intramuscular PRN for agitation (combined with Ativan and Benadryl)     --  The risks/benefits/side-effects/alternatives to this medication were discussed in detail with the patient and time was given for questions. The patient consents to medication trial.                -- Metabolic profile and EKG monitoring obtained while on an atypical antipsychotic (BMI: Lipid Panel: HbgA1c: QTc:)              -- Encouraged patient to participate in unit milieu and in scheduled group therapies                     3. Medical Issues Being Addressed:              Tobacco Use Disorder             -- Nicotine patch 14 mg/24 hours ordered             -- Smoking cessation encouraged   4. Discharge Planning:              -- Social work and case management to assist with discharge planning and identification of hospital follow-up  needs prior to discharge             -- Estimated LOS: 5-7 days             -- Discharge Concerns: Need to establish a safety plan; Medication compliance and effectiveness             -- Discharge Goals: Return home with outpatient referrals for mental health follow-up including medication management/psychotherapy    Total Time Spent in Direct Patient Care:  I personally spent 35 minutes on the unit in direct patient care. The direct patient care time included face-to-face time with the patient, reviewing the patient's chart, communicating with other professionals, and coordinating care. Greater than 50% of this time was spent in counseling or coordinating care with the patient regarding goals of hospitalization, psycho-education, and discharge planning needs.   Christoper Allegra, MD 09/29/2022, 11:21 AM

## 2022-09-30 MED ORDER — HALOPERIDOL DECANOATE 100 MG/ML IM SOLN
100.0000 mg | INTRAMUSCULAR | Status: DC
  Administered 2022-10-01: 100 mg via INTRAMUSCULAR
  Filled 2022-09-30: qty 1

## 2022-09-30 NOTE — Progress Notes (Signed)
[facility] MD Progress Note  09/30/2022 3:23 PM Justin Chaney  MRN:  [id number]  Subjective:     Patient is a 27 year old male with a documented psychiatric history of schizoaffective disorder bipolar type, presented to the [facility] under IVC with police escort.     My exam today, the patient is less argumentative and more pleasant. He participates in interview but not physical exam for eps. He remains fixated on discharge. We discussed that second loading dose of the LAI will be tomorrow, then we plan to discharge thereafter unless there is a concern from pt or LG or hospital staff. He reports mood is anxious, sleep is good. Denies AH, VH, paranoia. Denies SI, HI. Does not appear to be RTIS on my exam.  EKG qtc wnl from yesterday.   Denies any tremor or stiffness, as noted above he refuses the physical exam for EPS.  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 ENDOSCOPY;  Service: Cardiovascular;  Laterality: N/A;   TONSILLECTOMY     VENTRICULO-PERITONEAL SHUNT PLACEMENT / LAPAROSCOPIC INSERTION PERITONEAL CATHETER     Family History:  Family History  Problem Relation Age of Onset    Hypertension Mother    Mental illness Neg Hx    Family Psychiatric  History:  Patient refused to discuss all aspects of this    Social History:  Social History   Substance and Sexual Activity  Alcohol Use No     Social History   Substance and Sexual Activity  Drug Use Yes   Types: Other-see comments, Amphetamines    Social History   Socioeconomic History   Marital status: Single    Spouse name: Not on file   Number of children: Not on file   Years of education: Not on file   Highest education level: Not on file  Occupational History   Not on file  Tobacco Use   Smoking status: Every Day    Packs/day: 0.00    Types: Cigars, Cigarettes   Smokeless tobacco: Never  Vaping Use   Vaping Use: Never used  Substance and Sexual Activity   Alcohol use: No   Drug use: Yes    Types: Other-see comments, Amphetamines   Sexual activity: Not on file  Other Topics Concern   Not on file  Social History Narrative   Not on file   Social Determinants of Health   Financial Resource Strain: Not on file  Food Insecurity: Not on file  Transportation Needs: Not on file  Physical Activity: Not on file  Stress: Not on file  Social Connections: Not on file   Additional Social History:  Sleep: Fair  Appetite: okay  Current Medications: Current Facility-Administered Medications  Medication Dose Route Frequency Provider Last Rate Last Admin   acetaminophen (TYLENOL) tablet 650 mg  650 mg Oral Q6H PRN Derrill Center, NP       alum & mag hydroxide-simeth (MAALOX/MYLANTA) 200-200-20 MG/5ML suspension 30 mL  30 mL Oral Q4H PRN Derrill Center, NP       benztropine (COGENTIN) tablet 0.5 mg  0.5 mg Oral Q12H Avion Patella, Ovid Curd, MD   0.5 mg at 09/30/22 0754   benztropine mesylate (COGENTIN) injection 2 mg  2 mg Intramuscular BID PRN Anjulie Dipierro, Ovid Curd, MD       haloperidol (HALDOL) tablet 5 mg  5 mg Oral TID PRN Janine Limbo, MD   5 mg at 09/23/22  1130   And   LORazepam (ATIVAN) tablet 2 mg  2 mg Oral TID PRN Janine Limbo, MD   2 mg at 09/23/22 1130   And   diphenhydrAMINE (BENADRYL) capsule 50 mg  50 mg Oral TID PRN Janine Limbo, MD   50 mg at 09/23/22 1130   haloperidol lactate (HALDOL) injection 5 mg  5 mg Intramuscular TID PRN Janine Limbo, MD   5 mg at 09/24/22 1505   And   LORazepam (ATIVAN) injection 2 mg  2 mg Intramuscular TID PRN Janine Limbo, MD   2 mg at 09/24/22 1506   And   diphenhydrAMINE (BENADRYL) injection 50 mg  50 mg Intramuscular TID PRN Janine Limbo, MD   50 mg at 09/24/22 1505   haloperidol (HALDOL) 2 MG/ML solution 10 mg  10 mg Oral Q12H Matt Delpizzo, Ovid Curd, MD   10 mg at 09/30/22 0754   [START ON 10/01/2022] haloperidol decanoate (HALDOL DECANOATE) 100 MG/ML injection 100 mg  100 mg Intramuscular Q28 days Adalida Garver, Ovid Curd, MD       hydrOXYzine (ATARAX) tablet 25 mg  25 mg Oral TID PRN Janine Limbo, MD   25 mg at 09/25/22 [id number]   magnesium hydroxide (MILK OF MAGNESIA) suspension 30 mL  30 mL Oral Daily PRN Derrill Center, NP       nicotine (NICODERM CQ - dosed in mg/24 hours) patch 14 mg  14 mg Transdermal Daily PRN [place], Ovid Curd, MD       traZODone (DESYREL) tablet 50 mg  50 mg Oral QHS PRN Derrill Center, NP       valproic acid (DEPAKENE) 250 MG/5ML solution 500 mg  500 mg Oral Q12H Merci Walthers, MD   500 mg at 09/30/22 1106    Lab Results: No results found for this or any previous visit (from the past 48 hour(s)).  Blood Alcohol level:  Lab Results  Component Value Date   ETH <10 09/18/2022   ETH <10 [id number]    Metabolic Disorder Labs: Lab Results  Component Value Date   HGBA1C 3.9 (L) 09/18/2022   MPG 65.23 09/18/2022   MPG 97 12/22/2016   Lab Results  Component Value Date   PROLACTIN 2.4 (L) 12/22/2016   PROLACTIN 8.0 09/09/2015   Lab Results  Component Value Date   CHOL 113 09/18/2022   TRIG 112 09/18/2022   HDL 46 09/18/2022   CHOLHDL  2.5 09/18/2022   VLDL 22 09/18/2022   LDLCALC 45 09/18/2022   LDLCALC 56 12/22/2016    Physical Findings: AIMS: Facial and Oral Movements Muscles of Facial Expression: None, normal Lips and Perioral Area: None, normal Jaw: None, normal Tongue: None, normal,Extremity Movements Upper (arms, wrists, hands, fingers): None, normal  Lower (legs, knees, ankles, toes): None, normal, Trunk Movements Neck, shoulders, hips: None, normal, Overall Severity Severity of abnormal movements (highest score from questions above): None, normal Incapacitation due to abnormal movements: None, normal Patient's awareness of abnormal movements (rate only patient's report): No Awareness, Dental Status Current problems with teeth and/or dentures?: No Does patient usually wear dentures?: No  CIWA:    COWS:     Musculoskeletal: Strength & Muscle Tone: within normal limits Gait & Station: normal Patient leans: N/A  Patient refuses physical exam today for testing any tremor, cogwheeling, rigidity.  Psychiatric Specialty Exam:  Presentation  General Appearance:  -- laying in bed  Eye Contact: Poor  Speech: Demanding  Speech Volume: nml   Handedness: -- (uncooperative, uta)   Mood and Affect  Mood: Irritable   Affect: Congruent   Thought Process  Thought Processes: -- (Tangential)  Descriptions of Associations:More linear  Orientation:Partial  Thought Content:Linear   History of Schizophrenia/Schizoaffective disorder:Yes  Duration of Psychotic Symptoms:More  than six months  Hallucinations:No data recorded Denying AH, VH. Does not appear to be RTIS during my exam today.   Ideas of Reference:-- (Paranoid appearing) Suspicious   Suicidal Thoughts:No data recorded Denies  Homicidal Thoughts:No data recorded Denies   Sensorium  Memory: -- (Refuses to answer)  Judgment: Poor  Insight: Lacking   Executive Functions  Concentration: Poor  Attention  Span: Poor  Recall: Other (comment) (uncooperative, uta)  Fund of Knowledge: Other (comment) (uncooperative, uta)  Language: Other (comment) (uncooperative, uta)   Psychomotor Activity  Psychomotor Activity: No data recorded Walking the halls   Assets  Assets: Financial Resources/Insurance   Sleep  Sleep: No data recorded Adequate    Physical Exam: Physical Exam Vitals and nursing note reviewed.  Constitutional:      General: He is not in acute distress.    Appearance: He is normal weight. He is not toxic-appearing.  Pulmonary:     Effort: Pulmonary effort is normal.  Neurological:     Mental Status: He is alert.     Motor: No weakness.     Gait: Gait normal.    Review of Systems  Reason unable to perform ROS: Patient agitated, not participating in the exam.  Psychiatric/Behavioral:  Negative for depression, hallucinations and suicidal ideas. The patient is nervous/anxious. The patient does not have insomnia.    Blood pressure 125/80, pulse 93, temperature 98.4 F (36.9 C), temperature source Oral, resp. rate 18, weight 90.7 kg, SpO2 99 %. Body mass index is 36.58 kg/m.   Treatment Plan Summary:   **Patient has a legal guardian**   PLAN: Safety and Monitoring:             -- Involuntary admission to inpatient psychiatric unit for safety, stabilization and treatment             -- Daily contact with patient to assess and evaluate symptoms and progress in treatment             -- Patient's case to be discussed in multi-disciplinary team meeting             -- Observation Level : q15 minute checks             -- Vital signs:  q12 hours             -- Precautions: suicide, elopement, and assault   2. Psychiatric Diagnoses and Treatment:               -Continue Haldol  10 mg every 12 hours for psychotic disorder.  EKG today, 2/28 had QTc WNL. -Administered Haldol dec 100 mg IM LAI on 09-29-22 for psychotic disorder. Next dose of 100 mg LAI is due on  10-01-2022 (for a total dose of haldol dec 200 mg LAI qmonth)  -Continue Cogentin 0.5 mg every 12 hours for EPS prophylaxis -Continue Depakene liquid 500 mg q12H - for mood stability, irritability to and augment antipsychotic    -Continue Haldol oral and intramuscular PRN for agitation (combined with Ativan and Benadryl)     --  The risks/benefits/side-effects/alternatives to this medication were discussed in detail with the patient and time was given for questions. The patient consents to medication trial.                -- Metabolic profile and EKG monitoring obtained while on an atypical antipsychotic (BMI: Lipid Panel: HbgA1c: QTc:)              -- Encouraged patient to participate in unit milieu and in scheduled group therapies                     3. Medical Issues Being Addressed:              Tobacco Use Disorder             -- Nicotine patch 14 mg/24 hours ordered             -- Smoking cessation encouraged   4. Discharge Planning:              -- Social work and case management to assist with discharge planning and identification of hospital follow-up needs prior to discharge             -- Estimated LOS: dc plan for tomorrow 3-1              -- Discharge Concerns: Need to establish a safety plan; Medication compliance and effectiveness             -- Discharge Goals: Return home with outpatient referrals for mental health follow-up including medication management/psychotherapy    Total Time Spent in Direct Patient Care:  I personally spent 35 minutes on the unit in direct patient care. The direct patient care time included face-to-face time with the patient, reviewing the patient's chart, communicating with other professionals, and coordinating care. Greater than 50% of this time was spent in counseling or coordinating care with the patient regarding goals of hospitalization, psycho-education, and discharge planning needs.   Christoper Allegra, MD 09/30/2022, 3:23 PM

## 2022-09-30 NOTE — Progress Notes (Signed)
D- Patient alert and oriented. Denies SI, HI, AVH, and pain. Patient minimal and guarded in interactions and isolative to room with the exception of snack time.   A- Scheduled medications administered to patient, per MAR. Support and encouragement provided.  Routine safety checks conducted every 15 minutes.  Patient informed to notify staff with problems or concerns.  R- No adverse drug reactions noted. Patient contracts for safety at this time. Patient compliant with medications and treatment plan. Patient receptive, calm, and cooperative.   Patient remains safe at this time.

## 2022-09-30 NOTE — Group Note (Signed)
Recreation Therapy Group Note   Group Topic:Leisure Education  Group Date: 09/30/2022 Start Time: 1000 End Time: [id number] Facilitators: Jamarius Saha-McCall, LRT,CTRS Location: [address] Dayroom   Goal Area(s) Addresses:  Patient will identify positive leisure activities.  Patient will identify one positive benefit of participation in leisure activities.   Group Description: Patient and LRT participated in playing a game of UN[employer] and listened to music. LRT debriefed on what leisure is, what examples of leisure activities are, where you can participate in leisure and why leisure is important.    Affect/Mood: N/A   Participation Level: Did not attend    Clinical Observations/Individualized Feedback:     Plan: Continue to engage patient in RT group sessions 2-3x/week.   Arliss Hepburn-McCall, LRT,CTRS 09/30/2022 11:02 AM

## 2022-09-30 NOTE — Progress Notes (Signed)
   09/30/22 0000  Psych Admission Type (Psych Patients Only)  Admission Status Involuntary  Psychosocial Assessment  Patient Complaints Anxiety;Worrying  Eye Contact Fair  Facial Expression Flat  Affect Labile;Preoccupied  Speech Logical/coherent  Interaction Cautious;Guarded;Forwards little  Motor Activity Slow  Appearance/Hygiene Disheveled  Behavior Characteristics Cooperative  Mood Preoccupied  Aggressive Behavior  Effect No apparent injury  Thought Process  Coherency Disorganized;Tangential  Content Preoccupation  Delusions Paranoid  Perception Hallucinations  Hallucination Auditory  Judgment Impaired  Confusion None  Danger to Self  Current suicidal ideation? Denies

## 2022-09-30 NOTE — Progress Notes (Signed)
Pt did not attend wrap-up group   

## 2022-09-30 NOTE — Progress Notes (Signed)
Patient is isolative, refusing to talk at times, and preoccupied with discharge.    09/30/22 0900  Psych Admission Type (Psych Patients Only)  Admission Status Involuntary  Psychosocial Assessment  Patient Complaints Anxiety;Irritability  Eye Contact Brief  Facial Expression Flat  Affect Labile;Preoccupied  Speech Logical/coherent  Interaction Guarded;Minimal  Motor Activity Slow  Appearance/Hygiene Disheveled  Behavior Characteristics Cooperative  Mood Preoccupied;Labile  Aggressive Behavior  Effect No apparent injury  Thought Process  Coherency Disorganized  Content Preoccupation  Delusions Paranoid  Perception Hallucinations  Hallucination Auditory  Judgment Poor  Confusion None  Danger to Self  Current suicidal ideation? Denies  Danger to Others  Danger to Others None reported or observed

## 2022-09-30 NOTE — Plan of Care (Signed)
  Problem: Education: Goal: Emotional status will improve Outcome: Progressing Goal: Mental status will improve Outcome: Progressing   Problem: Health Behavior/Discharge Planning: Goal: Compliance with treatment plan for underlying cause of condition will improve Outcome: Progressing

## 2022-09-30 NOTE — BHH Counselor (Signed)
BHH/BMU LCSW Progress Note   09/30/2022    1:46 PM  Justin Chaney   [id number]   Type of Contact and Topic: ACTT referral  Referral placed and team lead contacted for ACTT PSI.  Abby, team lead, agreed to meet with patient tomorrow at 11am for an intake. CSW notified guardian and guardian plans to pick patient up at [place] plans to assist patient with getting a hotel.     Signed:  Riki Altes MSW, LCSW, LCAS 09/30/2022 1:46 PM

## 2022-10-01 LAB — VALPROIC ACID LEVEL: Valproic Acid Lvl: 61 ug/mL (ref 50.0–100.0)

## 2022-10-01 MED ORDER — NICOTINE 14 MG/24HR TD PT24: 14.0000 mg | MEDICATED_PATCH | Freq: Every day | TRANSDERMAL | 0 refills | Status: DC | PRN

## 2022-10-01 MED ORDER — DIVALPROEX SODIUM 500 MG PO DR TAB: 500.0000 mg | DELAYED_RELEASE_TABLET | Freq: Two times a day (BID) | ORAL | 0 refills | Status: DC

## 2022-10-01 MED ORDER — HALOPERIDOL DECANOATE 100 MG/ML IM SOLN: 100.0000 mg | INTRAMUSCULAR | 0 refills | Status: DC

## 2022-10-01 MED ORDER — BENZTROPINE MESYLATE 0.5 MG PO TABS: 0.5000 mg | ORAL_TABLET | Freq: Two times a day (BID) | ORAL | 0 refills | Status: DC

## 2022-10-01 MED ORDER — HALOPERIDOL 10 MG PO TABS: 10.0000 mg | ORAL_TABLET | Freq: Two times a day (BID) | ORAL | 0 refills | Status: DC

## 2022-10-01 MED ORDER — HYDROXYZINE HCL 25 MG PO TABS: 25.0000 mg | ORAL_TABLET | Freq: Three times a day (TID) | ORAL | 0 refills | Status: DC | PRN

## 2022-10-01 MED ORDER — HALOPERIDOL 5 MG PO TABS
10.0000 mg | ORAL_TABLET | Freq: Two times a day (BID) | ORAL | Status: DC
  Filled 2022-10-01 (×3): qty 2

## 2022-10-01 MED ORDER — DIVALPROEX SODIUM 500 MG PO DR TAB
500.0000 mg | DELAYED_RELEASE_TABLET | Freq: Two times a day (BID) | ORAL | Status: DC
  Filled 2022-10-01 (×3): qty 1

## 2022-10-01 NOTE — BHH Suicide Risk Assessment (Signed)
[facility] Discharge Suicide Risk Assessment   Principal Problem: Schizoaffective disorder, bipolar type [facility]) Discharge Diagnoses: Principal Problem:   Schizoaffective disorder, bipolar type ([name])   Total Time spent with patient: 15 minutes  Patient is a 27 year old male with a documented psychiatric history of schizoaffective disorder bipolar type, presented to the [facility] under IVC with police escort.        During the patient's hospitalization, patient had extensive initial psychiatric evaluation, and follow-up psychiatric evaluations every day.   Psychiatric diagnoses provided upon initial assessment:  Schizoaffective d/o bipolar type    Patient's psychiatric medications were adjusted on admission:  -start haldol 10 mg bid (forced med) -start cogentin 0.5 mg bid    During the hospitalization, other adjustments were made to the patient's psychiatric medication regimen:  -haldol 10 mg bid was continued -haldol decanoate 100 mg was administered on 09-29-22, and second dose of 100 mg was administered on 10-01-2022. Next dose of haldol dec 200 mg q28days is due on 10-29-2022.  -valproic acid 500 mg bid was started   Patient's care was discussed during the interdisciplinary team meeting every day during the hospitalization.   The patient denied having side effects to prescribed psychiatric medication.   Gradually, patient started adjusting to milieu. The patient was evaluated each day by a clinical provider to ascertain response to treatment. Improvement was noted by the patient's report of decreasing symptoms, improved sleep and appetite, affect, medication tolerance, behavior, and participation in unit programming.  Patient was asked each day to complete a self inventory noting mood, mental status, pain, new symptoms, anxiety and concerns.     Symptoms were reported as significantly decreased or resolved completely by discharge.    On day of discharge, the patient reports that their mood is  stable. The patient denied having suicidal thoughts for more than 48 hours prior to discharge.  Patient denies having homicidal thoughts.  Patient denies having auditory hallucinations.  Patient denies any visual hallucinations or other symptoms of psychosis. The patient was motivated to continue taking medication with a goal of continued improvement in mental health.    The patient reports their target psychiatric symptoms of psychosis and aggression, all responded well to the psychiatric medications, and the patient reports overall benefit other psychiatric hospitalization. Supportive psychotherapy was provided to the patient. The patient also participated in regular group therapy while hospitalized. Coping skills, problem solving as well as relaxation therapies were also part of the unit programming.   Labs were reviewed with the patient, and abnormal results were discussed with the patient.   The patient is able to verbalize their individual safety plan to this provider.   # It is recommended to the patient to continue psychiatric medications as prescribed, after discharge from the hospital.     # It is recommended to the patient to follow up with your outpatient psychiatric provider and PCP.   # It was discussed with the patient, the impact of alcohol, drugs, tobacco have been there overall psychiatric and medical wellbeing, and total abstinence from substance use was recommended the patient.ed.   # Prescriptions provided or sent directly to preferred pharmacy at discharge. Patient agreeable to plan. Given opportunity to ask questions. Appears to feel comfortable with discharge.    # In the event of worsening symptoms, the patient is instructed to call the crisis hotline, 911 and or go to the nearest ED for appropriate evaluation and treatment of symptoms. To follow-up with primary care provider for other medical issues, concerns  and or health care needs   # Patient was discharged care of  legal guardian, with a plan to follow up as noted below.  Psychiatric Specialty Exam  Presentation  General Appearance:  Casual; Fairly Groomed  Eye Contact: Good  Speech: Normal Rate  Speech Volume: Normal  Handedness: -- (uncooperative, uta)   Mood and Affect  Mood: Euthymic  Duration of Depression Symptoms: Less than two weeks  Affect: Congruent; Full Range   Thought Process  Thought Processes: Linear  Descriptions of Associations:Intact  Orientation:Full (Time, Place and Person)  Thought Content:Logical  History of Schizophrenia/Schizoaffective disorder:Yes  Duration of Psychotic Symptoms:Less than six months  Hallucinations:Hallucinations: None  Ideas of Reference:None  Suicidal Thoughts:Suicidal Thoughts: No  Homicidal Thoughts:Homicidal Thoughts: No   Sensorium  Memory: Immediate Fair; Remote Fair; Recent Fair  Judgment: Fair  Insight: Lacking   Executive Functions  Concentration: Fair  Attention Span: Fair  Recall: Good  Fund of Knowledge: Good  Language: Good   Psychomotor Activity  Psychomotor Activity: Psychomotor Activity: Normal   Assets  Assets: Financial Resources/Insurance   Sleep  Sleep: Sleep: Fair   Physical Exam: Physical Exam See discharge summary  ROS See discharge summary  Blood pressure 125/80, pulse 93, temperature 98.4 F (36.9 C), temperature source Oral, resp. rate 18, weight 90.7 kg, SpO2 99 %. Body mass index is 36.58 kg/m.  Mental Status Per Nursing Assessment::   On Admission:     Demographic Factors:  Male, Adolescent or young adult, Low socioeconomic status, and Unemployed  Loss Factors: Financial problems/change in socioeconomic status  Historical Factors: Impulsivity  Risk Reduction Factors:   Positive social support  Continued Clinical Symptoms:  Schizoaffective d/o bipolar type - no psychosis present, mood is stable, denying SI, HI.   Cognitive Features  That Contribute To Risk:  Closed-mindedness    Suicide Risk:  Mild:  There are no identifiable suicide plans, no associated intent, mild dysphoria and related symptoms, good self-control (both objective and subjective assessment), few other risk factors, and identifiable protective factors, including available and accessible social support.    [place]. Go to.   Specialty: Behavioral Health Why: Please go to this provider for an assessment, to obtain therapy and medication management services on Monday through Friday, arrive no later than 7:20 am as services are first come, first served. Contact information: [place] [facility].   Why: A referral has been placed on your behalf.  Please reach out to schedule an assessment to get connected to services. Contact information: [place] [address] [place] Follow up.   Why: A referral has been placed on your behalf. Please follow up to get connected with services. Contact information: [place] [address] [phone]                 Plan Of Care/Follow-up recommendations:    Activity: as tolerated   Diet: heart healthy   Other: -Follow-up with your outpatient psychiatric provider -instructions on appointment date, time, and address (location) are provided to you in discharge paperwork.   -Take your psychiatric medications as prescribed at discharge - instructions are provided to you in the discharge paperwork   -Follow-up with outpatient primary care doctor and other specialists -for management of preventative medicine and chronic medical  disease   -Recommend abstinence from alcohol, tobacco, and other illicit drug use at discharge.    -If your psychiatric symptoms recur, worsen, or if you have  side effects to your psychiatric medications, call your outpatient psychiatric provider, 911, 988 or go to the nearest emergency department.   -If suicidal thoughts recur, call your outpatient psychiatric provider, 911, 988 or go to the nearest emergency department.  Christoper Allegra, MD 10/01/2022, 9:40 AM

## 2022-10-01 NOTE — Discharge Instructions (Signed)
-  Follow-up with your outpatient psychiatric provider -instructions on appointment date, time, and address (location) are provided to you in discharge paperwork.  -Take your psychiatric medications as prescribed at discharge - instructions are provided to you in the discharge paperwork  -Follow-up with outpatient primary care doctor and other specialists -for management of preventative medicine and any chronic medical disease.  -Recommend abstinence from alcohol, tobacco, and other illicit drug use at discharge.   -If your psychiatric symptoms recur, worsen, or if you have side effects to your psychiatric medications, call your outpatient psychiatric provider, 911, 988 or go to the nearest emergency department.  -If suicidal thoughts occur, call your outpatient psychiatric provider, 911, 988 or go to the nearest emergency department.  Naloxone (Narcan) can help reverse an overdose when given to the victim quickly.  [place] offers free naloxone kits and instructions/training on its use.  Add naloxone to your first aid kit and you can help save a life.   Pick up your free kit at the following locations:   [place]:  [facility], [address] [place] [address] ([phone]) [facility] [address] [place] [address] ([phone]) [facility] [address]  [place] [address]  [place]: [facility] [address] [place] [address] ([phone]) [facility] [address] [place] [address] ([phone])  

## 2022-10-01 NOTE — Progress Notes (Signed)
   10/01/22 0545  15 Minute Checks  Location Bedroom  Visual Appearance Calm  Behavior Sleeping  Sleep (Behavioral Health Patients Only)  Calculate sleep? (Click Yes once per 24 hr at 0600 safety check) Yes  Documented sleep last 24 hours 9.5

## 2022-10-01 NOTE — Group Note (Signed)
Recreation Therapy Group Note   Group Topic:Problem Solving  Group Date: 10/01/2022 Start Time: 1000 End Time: 1030 Facilitators: Trinette Vera-McCall, LRT,CTRS Location: 500 Hall Dayroom   Goal Area(s) Addresses:  Patient will effectively work in a team with other group members. Patient will verbalize importance of using appropriate problem solving techniques.   Group Description:  Brain Teasers.  LRT gave each patient two copies of brain teasers.  Patients could work together or individually to figure out the answer to each puzzle.  When patients finished, LRT went over the sheets with the patients and used answer key to give the correct answers.       Affect/Mood: N/A   Participation Level: Did not attend    Clinical Observations/Individualized Feedback:     Plan: Continue to engage patient in RT group sessions 2-3x/week.   Gregory Dowe-McCall, LRT,CTRS 10/01/2022 11:37 AM

## 2022-10-01 NOTE — BHH Group Notes (Signed)
Spirituality group facilitated by Chaplain Katy Maleya Leever, BCC.  Group Description: Group focused on topic of hope. Patients participated in facilitated discussion around topic, connecting with one another around experiences and definitions for hope. Group members engaged with visual explorer photos, reflecting on what hope looks like for them today. Group engaged in discussion around how their definitions of hope are present today in hospital.  Modalities: Psycho-social ed, Adlerian, Narrative, MI  Patient Progress: Did not attend.  

## 2022-10-01 NOTE — Plan of Care (Signed)
Patient discharged to home/self care this shift.

## 2022-10-01 NOTE — Progress Notes (Signed)
Patient ID: Justin Chaney, male   DOB: Aug 21, 1995, 27 y.o.   MRN: [id number] Patient denies SI, HI, and AVH upon discharge. Patient acknowledged understanding of all discharge instructions and receipt of personal belongings. Patient discharge into the care of his guardian.

## 2022-10-01 NOTE — Progress Notes (Signed)
Recreation Therapy Notes  INPATIENT RECREATION TR PLAN  Patient Details Name: Justin Chaney MRN: [id number] DOB: 1996-04-14 Today's Date: 10/01/2022  Rec Therapy Plan Is patient appropriate for Therapeutic Recreation?: Yes Treatment times per week: about 3 days Estimated Length of Stay: 5-7 days TR Treatment/Interventions: Group participation (Comment)  Discharge Criteria Pt will be discharged from therapy if:: Discharged Treatment plan/goals/alternatives discussed and agreed upon by:: Patient/family  Discharge Summary Short term goals set: See patient care plan Short term goals met: Not met Reason goals not met: Pt did not attend group sessions. Therapeutic equipment acquired: N/A Reason patient discharged from therapy: Discharge from hospital Pt/family agrees with progress & goals achieved: Yes Date patient discharged from therapy: 10/01/22   Eldar Robitaille-McCall, LRT,CTRS Shelbey Spindler A [name] Carrick-McCall 10/01/2022, 12:06 PM

## 2022-10-01 NOTE — Plan of Care (Signed)
Patient did not attend any recreation therapy group sessions.   Kennesha Brewbaker-McCall, LRT,CTRS

## 2022-10-01 NOTE — Discharge Summary (Signed)
Physician Discharge Summary Note  Patient:  Justin Chaney is an 27 y.o., male MRN:  [id number] DOB:  [birth year] Patient phone:  [phone] (home)  Patient address:   [place] [address],  Total Time spent with patient: 15 minutes  Date of Admission:  09/20/2022 Date of Discharge: 10-01-2022  Reason for Admission:    Patient is a 27 year old male with a documented psychiatric history of schizoaffective disorder bipolar type, presented to the [facility] under IVC with police escort.      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   Depression    Homelessness    Schizophrenia ([place])    Seizures ([place])    childhood seizures   Suicidal behavior    Suicidal intent     Past Surgical History:  Procedure Laterality Date   APPENDECTOMY     SHUNT REMOVAL     TEE WITHOUT CARDIOVERSION N/A 08/14/2019   Procedure: TRANSESOPHAGEAL ECHOCARDIOGRAM (TEE);  Surgeon: Pixie Casino, MD;  Location: [facility] ENDOSCOPY;  Service: Cardiovascular;  Laterality: N/A;   TONSILLECTOMY     VENTRICULO-PERITONEAL SHUNT PLACEMENT / LAPAROSCOPIC INSERTION PERITONEAL CATHETER     Family History:  Family History  Problem Relation Age of Onset   Hypertension Mother    Mental illness Neg Hx    Family Psychiatric  History:  None reported  Social History:  Social History   Substance and Sexual Activity  Alcohol Use No     Social History   Substance and Sexual Activity  Drug Use Yes   Types: Other-see comments, Amphetamines    Social History   Socioeconomic History   Marital  status: Single    Spouse name: Not on file   Number of children: Not on file   Years of education: Not on file   Highest education level: Not on file  Occupational History   Not on file  Tobacco Use   Smoking status: Every Day    Packs/day: 0.00    Types: Cigars, Cigarettes   Smokeless tobacco: Never  Vaping Use   Vaping Use: Never used  Substance and Sexual Activity   Alcohol use: No   Drug use: Yes    Types: Other-see comments, Amphetamines   Sexual activity: Not on file  Other Topics Concern   Not on file  Social History Narrative   Not on file   Social Determinants of Health   Financial Resource Strain: Not on file  Food Insecurity: Not on file  Transportation Needs: Not on file  Physical Activity: Not on file  Stress: Not on file  Social Connections: Not on file    Hospital Course:      During the patient's hospitalization, patient had extensive initial psychiatric evaluation, and follow-up psychiatric evaluations every day.  Psychiatric diagnoses provided upon initial assessment:  Schizoaffective d/o bipolar type   Patient's psychiatric medications were adjusted on admission:  -start haldol 10 mg bid (forced med) -start cogentin 0.5 mg bid   During the hospitalization, other adjustments were made to the patient's psychiatric medication regimen:  -haldol 10 mg bid was continued -haldol decanoate 100 mg was administered on 09-29-22, and second dose of  100 mg was administered on 10-01-2022. Next dose of haldol dec 200 mg q28days is due on 10-29-2022.  -valproic acid 500 mg bid was started  Patient's care was discussed during the interdisciplinary team meeting every day during the hospitalization.  The patient denied having side effects to prescribed psychiatric medication.  Gradually, patient started adjusting to milieu. The patient was evaluated each day by a clinical provider to ascertain response to treatment. Improvement was noted by the patient's report of  decreasing symptoms, improved sleep and appetite, affect, medication tolerance, behavior, and participation in unit programming.  Patient was asked each day to complete a self inventory noting mood, mental status, pain, new symptoms, anxiety and concerns.    Symptoms were reported as significantly decreased or resolved completely by discharge.   On day of discharge, the patient reports that their mood is stable. The patient denied having suicidal thoughts for more than 48 hours prior to discharge.  Patient denies having homicidal thoughts.  Patient denies having auditory hallucinations.  Patient denies any visual hallucinations or other symptoms of psychosis. The patient was motivated to continue taking medication with a goal of continued improvement in mental health.   The patient reports their target psychiatric symptoms of psychosis and aggression, all responded well to the psychiatric medications, and the patient reports overall benefit other psychiatric hospitalization. Supportive psychotherapy was provided to the patient. The patient also participated in regular group therapy while hospitalized. Coping skills, problem solving as well as relaxation therapies were also part of the unit programming.  Labs were reviewed with the patient, and abnormal results were discussed with the patient.  The patient is able to verbalize their individual safety plan to this provider.  # It is recommended to the patient to continue psychiatric medications as prescribed, after discharge from the hospital.    # It is recommended to the patient to follow up with your outpatient psychiatric provider and PCP.  # It was discussed with the patient, the impact of alcohol, drugs, tobacco have been there overall psychiatric and medical wellbeing, and total abstinence from substance use was recommended the patient.ed.  # Prescriptions provided or sent directly to preferred pharmacy at discharge. Patient agreeable to plan.  Given opportunity to ask questions. Appears to feel comfortable with discharge.    # In the event of worsening symptoms, the patient is instructed to call the crisis hotline, 911 and or go to the nearest ED for appropriate evaluation and treatment of symptoms. To follow-up with primary care provider for other medical issues, concerns and or health care needs  # Patient was discharged care of legal guardian, with a plan to follow up as noted below.   Physical Findings: AIMS: Facial and Oral Movements Muscles of Facial Expression: None, normal Lips and Perioral Area: None, normal Jaw: None, normal Tongue: None, normal,Extremity Movements Upper (arms, wrists, hands, fingers): None, normal Lower (legs, knees, ankles, toes): None, normal, Trunk Movements Neck, shoulders, hips: None, normal, Overall Severity Severity of abnormal movements (highest score from questions above): None, normal Incapacitation due to abnormal movements: None, normal Patient's awareness of abnormal movements (rate only patient's report): No Awareness, Dental Status Current problems with teeth and/or dentures?: No Does patient usually wear dentures?: No  CIWA:    COWS:     Aims score zero on my exam. No eps on my exam.   Musculoskeletal: Strength & Muscle Tone: within normal limits Gait & Station: normal Patient leans: N/A   Psychiatric Specialty Exam:  Presentation  General Appearance:  Casual; Fairly Groomed  Eye Contact: Good  Speech: Normal Rate  Speech Volume: Normal  Handedness: -- (uncooperative, uta)   Mood and Affect  Mood: Euthymic  Affect: Congruent; Full Range   Thought Process  Thought Processes: Linear  Descriptions of Associations:Intact  Orientation:Full (Time, Place and Person)  Thought Content:Logical  History of Schizophrenia/Schizoaffective disorder:Yes  Duration of Psychotic Symptoms:Less than six months  Hallucinations:Hallucinations: None  Ideas of  Reference:None  Suicidal Thoughts:Suicidal Thoughts: No  Homicidal Thoughts:Homicidal Thoughts: No   Sensorium  Memory: Immediate Fair; Remote Fair; Recent Fair  Judgment: Fair  Insight: Lacking   Executive Functions  Concentration: Fair  Attention Span: Fair  Recall: Good  Fund of Knowledge: Good  Language: Good   Psychomotor Activity  Psychomotor Activity: Psychomotor Activity: Normal   Assets  Assets: Financial Resources/Insurance   Sleep  Sleep: Sleep: Fair    Physical Exam: Physical Exam Vitals reviewed.  Constitutional:      General: He is not in acute distress.    Appearance: He is normal weight. He is not toxic-appearing.  Pulmonary:     Effort: Pulmonary effort is normal.  Neurological:     Mental Status: He is alert.     Motor: No weakness.     Gait: Gait normal.    Review of Systems  Constitutional:  Negative for chills and fever.  Cardiovascular:  Negative for chest pain and palpitations.  Neurological:  Negative for dizziness, tingling, tremors and headaches.  Psychiatric/Behavioral:  Negative for depression, hallucinations, memory loss, substance abuse and suicidal ideas. The patient is not nervous/anxious and does not have insomnia.   All other systems reviewed and are negative.  Blood pressure 125/80, pulse 93, temperature 98.4 F (36.9 C), temperature source Oral, resp. rate 18, weight 90.7 kg, SpO2 99 %. Body mass index is 36.58 kg/m.   Social History   Tobacco Use  Smoking Status Every Day   Packs/day: 0.00   Types: Cigars, Cigarettes  Smokeless Tobacco Never   Tobacco Cessation:  A prescription for an FDA-approved tobacco cessation medication provided at discharge   Blood Alcohol level:  Lab Results  Component Value Date   ETH <10 09/18/2022   ETH <10 [id number]    Metabolic Disorder Labs:  Lab Results  Component Value Date   HGBA1C 3.9 (L) 09/18/2022   MPG 65.23 09/18/2022   MPG 97 12/22/2016    Lab Results  Component Value Date   PROLACTIN 2.4 (L) 12/22/2016   PROLACTIN 8.0 09/09/2015   Lab Results  Component Value Date   CHOL 113 09/18/2022   TRIG 112 09/18/2022   HDL 46 09/18/2022   CHOLHDL 2.5 09/18/2022   VLDL 22 09/18/2022   LDLCALC 45 09/18/2022   LDLCALC 56 12/22/2016    See Psychiatric Specialty Exam and Suicide Risk Assessment completed by Attending Physician prior to discharge.  Discharge destination:  Other:  to care of [facility]  Is patient on multiple antipsychotic therapies at discharge:  No   Has Patient had three or more failed trials of antipsychotic monotherapy by history:  No  Recommended Plan for Multiple Antipsychotic Therapies: NA  Discharge Instructions     Diet - low sodium heart healthy   Complete by: As directed    Increase activity slowly   Complete by: As directed       Allergies as of 10/01/2022       Reactions   Amoxicillin Hives, Other (See Comments)   CHILDHOOD ALLERGY - Per [facility] records,  tolerates Ancef and Rocephin Has patient had a PCN reaction causing immediate rash, facial/tongue/throat swelling, SOB or lightheadedness with hypotension: YES Has patient had a PCN reaction causing severe rash involving mucus membranes or skin necrosis: No Has patient had a PCN reaction that required hospitalization No   Penicillin G Other (See Comments)   Reaction not cited, but the patient stated he IS allergic        Medication List     TAKE these medications      Indication  benztropine 0.5 MG tablet Commonly known as: COGENTIN Take 1 tablet (0.5 mg total) by mouth every 12 (twelve) hours for 14 days.  Indication: Extrapyramidal Reaction caused by Medications   divalproex 500 MG DR tablet Commonly known as: DEPAKOTE Take 1 tablet (500 mg total) by mouth every 12 (twelve) hours for 14 days. Start taking on: October 02, 2022  Indication: Schizophrenia   haloperidol 10 MG tablet Commonly known as: HALDOL Take 1 tablet (10 mg  total) by mouth every 12 (twelve) hours for 14 days. Start taking on: October 02, 2022  Indication: Psychosis   haloperidol decanoate 100 MG/ML injection Commonly known as: HALDOL DECANOATE Inject 1 mL (100 mg total) into the muscle every 28 (twenty-eight) days for 1 dose. Next dose is due on 10-29-2022 Start taking on: October 29, 2022  Indication: Schizophrenia   hydrOXYzine 25 MG tablet Commonly known as: ATARAX Take 1 tablet (25 mg total) by mouth 3 (three) times daily as needed for anxiety.  Indication: Feeling Anxious   nicotine 14 mg/24hr patch Commonly known as: NICODERM CQ - dosed in mg/24 hours Place 1 patch (14 mg total) onto the skin daily as needed (Smoking cessation).  Indication: Nicotine Addiction        Follow-up Information     [place]. Go to.   Specialty: Behavioral Health Why: Please go to this provider for an assessment, to obtain therapy and medication management services on Monday through Friday, arrive no later than 7:20 am as services are first come, first served. Contact information: [place] Follow up.   Why: A referral has been placed on your behalf. Please follow up to get connected with services. Contact information: [place] [address] [phone]                 Follow-up recommendations:    Activity: as tolerated  Diet: heart healthy  Other: -Follow-up with your outpatient psychiatric provider -instructions on appointment date, time, and address (location) are provided to you in discharge paperwork.  -Take your psychiatric medications as prescribed at discharge - instructions are provided to you in the discharge paperwork  -Follow-up with outpatient primary care doctor and other specialists -for management of preventative medicine and chronic medical disease  -Testing: Follow-up with  outpatient provider for lab results:  VA level 10-01-22: 61   -Recommend abstinence from alcohol, tobacco, and other illicit drug use at discharge.   -If your psychiatric symptoms recur, worsen, or if you have side effects to your psychiatric medications, call your outpatient psychiatric provider, 911, [phone] or go to the nearest emergency department.  -If suicidal thoughts recur, call your outpatient psychiatric provider, 911, 988 or go to the nearest emergency department.   Signed: Christoper Allegra, MD 10/01/2022, 3:17 PM     Total Time Spent in Direct Patient Care:  I personally spent 35 minutes  on the unit in direct patient care. The direct patient care time included face-to-face time with the patient, reviewing the patient's chart, communicating with other professionals, and coordinating care. Greater than 50% of this time was spent in counseling or coordinating care with the patient regarding goals of hospitalization, psycho-education, and discharge planning needs.   Janine Limbo, MD Psychiatrist

## 2022-10-06 ENCOUNTER — Encounter (HOSPITAL_COMMUNITY): Payer: Self-pay | Admitting: *Deleted

## 2022-10-06 ENCOUNTER — Other Ambulatory Visit: Payer: Self-pay

## 2022-10-06 ENCOUNTER — Emergency Department (HOSPITAL_COMMUNITY)
Admission: EM | Admit: 2022-10-06 | Discharge: 2022-10-07 | Disposition: A | Discharge status: Home / Self Care | Payer: Medicaid Other | Attending: Emergency Medicine | Admitting: Emergency Medicine

## 2022-10-06 DIAGNOSIS — F1410 Cocaine abuse, uncomplicated: Secondary | ICD-10-CM

## 2022-10-06 DIAGNOSIS — R440 Auditory hallucinations: Secondary | ICD-10-CM | POA: Diagnosis present

## 2022-10-06 DIAGNOSIS — Z20822 Contact with and (suspected) exposure to covid-19: Secondary | ICD-10-CM | POA: Insufficient documentation

## 2022-10-06 DIAGNOSIS — Z8659 Personal history of other mental and behavioral disorders: Secondary | ICD-10-CM

## 2022-10-06 DIAGNOSIS — F1510 Other stimulant abuse, uncomplicated: Secondary | ICD-10-CM

## 2022-10-06 DIAGNOSIS — F258 Other schizoaffective disorders: Secondary | ICD-10-CM | POA: Insufficient documentation

## 2022-10-06 DIAGNOSIS — F1994 Other psychoactive substance use, unspecified with psychoactive substance-induced mood disorder: Secondary | ICD-10-CM

## 2022-10-06 LAB — CBC WITH DIFFERENTIAL/PLATELET
Abs Immature Granulocytes: 0.02 10*3/uL (ref 0.00–0.07)
Basophils Absolute: 0.1 10*3/uL (ref 0.0–0.1)
Basophils Relative: 1 %
Eosinophils Absolute: 0.1 10*3/uL (ref 0.0–0.5)
Eosinophils Relative: 1 %
HCT: 44.4 % (ref 39.0–52.0)
Hemoglobin: 14.6 g/dL (ref 13.0–17.0)
Immature Granulocytes: 0 %
Lymphocytes Relative: 30 %
Lymphs Abs: 2.4 10*3/uL (ref 0.7–4.0)
MCH: 28.2 pg (ref 26.0–34.0)
MCHC: 32.9 g/dL (ref 30.0–36.0)
MCV: 85.7 fL (ref 80.0–100.0)
Monocytes Absolute: 1 10*3/uL (ref 0.1–1.0)
Monocytes Relative: 13 %
Neutro Abs: 4.3 10*3/uL (ref 1.7–7.7)
Neutrophils Relative %: 55 %
Platelets: 228 10*3/uL (ref 150–400)
RBC: 5.18 MIL/uL (ref 4.22–5.81)
RDW: 11.4 % — ABNORMAL LOW (ref 11.5–15.5)
WBC: 7.8 10*3/uL (ref 4.0–10.5)
nRBC: 0 % (ref 0.0–0.2)

## 2022-10-06 LAB — COMPREHENSIVE METABOLIC PANEL
ALT: 22 U/L (ref 0–44)
AST: 25 U/L (ref 15–41)
Albumin: 4 g/dL (ref 3.5–5.0)
Alkaline Phosphatase: 65 U/L (ref 38–126)
Anion gap: 8 (ref 5–15)
BUN: 10 mg/dL (ref 6–20)
CO2: 26 mmol/L (ref 22–32)
Calcium: 9.1 mg/dL (ref 8.9–10.3)
Chloride: 105 mmol/L (ref 98–111)
Creatinine, Ser: 0.78 mg/dL (ref 0.61–1.24)
GFR, Estimated: >60 mL/min (ref >60)
Glucose, Bld: 68 mg/dL — ABNORMAL LOW (ref 70–99)
Potassium: 3.9 mmol/L (ref 3.5–5.1)
Sodium: 139 mmol/L (ref 135–145)
Total Bilirubin: 0.5 mg/dL (ref 0.3–1.2)
Total Protein: 7.1 g/dL (ref 6.5–8.1)

## 2022-10-06 LAB — URINALYSIS, ROUTINE W REFLEX MICROSCOPIC
Bilirubin Urine: NEGATIVE
Glucose, UA: NEGATIVE mg/dL
Hgb urine dipstick: NEGATIVE
Ketones, ur: NEGATIVE mg/dL
Leukocytes,Ua: NEGATIVE
Nitrite: NEGATIVE
Protein, ur: NEGATIVE mg/dL
Specific Gravity, Urine: 1.025 (ref 1.005–1.030)
pH: 6 (ref 5.0–8.0)

## 2022-10-06 LAB — RESP PANEL BY RT-PCR (RSV, FLU A&B, COVID)  RVPGX2
Influenza A by PCR: NEGATIVE
Influenza B by PCR: NEGATIVE
Resp Syncytial Virus by PCR: NEGATIVE
SARS Coronavirus 2 by RT PCR: NEGATIVE

## 2022-10-06 LAB — RAPID URINE DRUG SCREEN, HOSP PERFORMED
Amphetamines: POSITIVE — AB
Barbiturates: NOT DETECTED
Benzodiazepines: NOT DETECTED
Cocaine: POSITIVE — AB
Opiates: NOT DETECTED
Tetrahydrocannabinol: NOT DETECTED

## 2022-10-06 LAB — ACETAMINOPHEN LEVEL: Acetaminophen (Tylenol), Serum: <10 ug/mL — ABNORMAL LOW (ref 10–30)

## 2022-10-06 LAB — SALICYLATE LEVEL: Salicylate Lvl: <7 mg/dL — ABNORMAL LOW (ref 7.0–30.0)

## 2022-10-06 LAB — ETHANOL: Alcohol, Ethyl (B): <10 mg/dL (ref <10)

## 2022-10-06 NOTE — ED Notes (Signed)
The triage pa does not want the pt dressed out  he is hesring voices and seeing things but he has not had his  normal meds

## 2022-10-06 NOTE — ED Provider Triage Note (Signed)
Emergency Medicine Provider Triage Evaluation Note  Justin Chaney , a 27 y.o. male  was evaluated in triage.  Pt complains of auditory and visual hallucinations.  Has a history of schizophrenia.  Has not taken his medications for 3 months due to inability to afford them.  Denies HI or SI.  States his mother recommended he come to the ED for evaluation of possible placement for inpatient care due to auditory and visual hallucinations.  Denies other symptoms.  Review of Systems  Positive: As above Negative: As above  Physical Exam  BP 117/63   Pulse 97   Temp 98.4 F (36.9 C)   Resp 16   Ht '5\' 2"'$  (1.575 m)   Wt 90.7 kg   SpO2 100%   BMI 36.57 kg/m  Gen:   Awake, no distress   Resp:  Normal effort  MSK:   Moves extremities without difficulty  Other:    Medical Decision Making  Medically screening exam initiated at 5:29 PM.  Appropriate orders placed.  Justin Chaney was informed that the remainder of the evaluation will be completed by another provider, this initial triage assessment does not replace that evaluation, and the importance of remaining in the ED until their evaluation is complete.  Medical clearance workup initiated   Justin Chaney 10/06/22 1731

## 2022-10-06 NOTE — ED Notes (Signed)
Social worker notified patient is in ED and will follow up with DSS in the AM if patient remains in the ED; Patient is under DSSc care-Monique,RN

## 2022-10-06 NOTE — ED Provider Notes (Signed)
[facility] Provider Note   CSN: [id number] Arrival date & time: 10/06/22  1616     History Chief Complaint  Patient presents with   wants a long term psy facility    HPI Justin Chaney is a 27 y.o. male presenting for chief complaint of audiovisual hallucinations.  States that he has a history of schizoaffective disorder and that his hallucinations have gotten significantly worse since he left the psychiatric facility.  He left the psychiatric facility a few weeks ago.  Per chart review, he was supposed to be on Cogentin and Haldol.  He states he has not been on any medicine since he left and that he is having command hallucinations. Fortunately denies suicidal homicidal hallucination.  He does have pressured speech.  Endorses poor sleep.  Denies fevers chills nausea vomiting syncope or shortness of breath.  States that the family member he lives with does not feel comfortable with him at home.  He does have a legal guardian. Attempted to call collateral with legal guardian, however no answer on any phone numbers.  Patient's recorded medical, surgical, social, medication list and allergies were reviewed in the Snapshot window as part of the initial history.   Review of Systems   Review of Systems  Constitutional:  Negative for chills and fever.  HENT:  Negative for ear pain and sore throat.   Eyes:  Negative for pain and visual disturbance.  Respiratory:  Negative for cough and shortness of breath.   Cardiovascular:  Negative for chest pain and palpitations.  Gastrointestinal:  Negative for abdominal pain and vomiting.  Genitourinary:  Negative for dysuria and hematuria.  Musculoskeletal:  Negative for arthralgias and back pain.  Skin:  Negative for color change and rash.  Neurological:  Negative for seizures and syncope.  All other systems reviewed and are negative.   Physical Exam Updated Vital Signs BP 117/63   Pulse 97   Temp  98.4 F (36.9 C)   Resp 16   Ht '5\' 2"'$  (1.575 m)   Wt 90.7 kg   SpO2 100%   BMI 36.57 kg/m  Physical Exam Vitals and nursing note reviewed.  Constitutional:      General: He is not in acute distress.    Appearance: He is well-developed.  HENT:     Head: Normocephalic and atraumatic.  Eyes:     Conjunctiva/sclera: Conjunctivae normal.  Cardiovascular:     Rate and Rhythm: Normal rate and regular rhythm.     Heart sounds: No murmur heard. Pulmonary:     Effort: Pulmonary effort is normal. No respiratory distress.     Breath sounds: Normal breath sounds.  Abdominal:     Palpations: Abdomen is soft.     Tenderness: There is no abdominal tenderness.  Musculoskeletal:        General: No swelling.     Cervical back: Neck supple.  Skin:    General: Skin is warm and dry.     Capillary Refill: Capillary refill takes less than 2 seconds.  Neurological:     Mental Status: He is alert.  Psychiatric:        Mood and Affect: Mood normal.      ED Course/ Medical Decision Making/ A&P    Procedures Procedures   Medications Ordered in ED Medications - No data to display Medical Decision Making:   Justin Chaney is a 27 y.o. male who presented to the ED today for psychiatric evaluation.  Patient is  endorsing AVH.  Patient does have a history of SA disorder for which they are on multiple medications.  They are not compliant with their medications.  On my initial exam, the pt was linear in thought, appropriate in affect, and overall well-appearing.  Vital signs reviewed and reassuring.  Reviewed and confirmed nursing documentation for past medical history, family history, social history.   Patient's presentation is complicated by their history of multiple comorbid medical problems.  Patient placed on continuous vitals and telemetry monitoring while in ED which was reviewed periodically.     Initial Assessment:   This is most consistent with an acute life threatening illness. With  the patient's presentation of altered visualizations, concern for decompensating schizoaffective disorder, patient warrants emergent psychiatric consultation.  Differential includes primary psychosis, substance-induced psychosis, mood disturbance.  Initial Plan:  Patient immediately placed into ED psychiatric hold protocol including suicide precautions, elopement precautions and vital sign monitoring.    Emergent behavioral health hold signed and notarized while awaiting psychiatric consultation due to threat to self or others.  Psychiatry consulted for further evaluation once patient medically cleared.  Medical screening evaluation ordered and reviewed with no obvious medical reason to postpone psychiatric evaluation.  Patient is voluntary at this time.  No IVC.  May need to be reassessed if capacity is changing.     Final Assessment and Plan:   Psychiatry recommendations are pending at time of handoff.  Patient placed into emergency department boarder status at this time.    Clinical Impression:  1. Other schizoaffective disorders ([place])      Data Unavailable   Final Clinical Impression(s) / ED Diagnoses Final diagnoses:  Other schizoaffective disorders ([place])    Rx / DC Orders ED Discharge Orders     None         Tretha Sciara, MD 10/06/22 2339

## 2022-10-06 NOTE — ED Triage Notes (Signed)
The pt requesting to go to a long term facility  he is not si or hi  he is not on any meds he reports that his mother wants him to go to a facility

## 2022-10-07 NOTE — ED Notes (Signed)
TTS in process 

## 2022-10-07 NOTE — ED Notes (Signed)
Pt discharged. Legal guardian aware. D/C papers discussed w/ pt. A&Ox4 and VSS upon D/C

## 2022-10-07 NOTE — ED Notes (Signed)
Pt agitated and pacing because he can't leave yet. Pt is inpatient and agitated.

## 2022-10-07 NOTE — ED Provider Notes (Signed)
Emergency Medicine Observation Re-evaluation Note  Justin Chaney is a 27 y.o. male, seen on rounds today.  Pt initially presented to the ED for complaints of pt had said he/someone wanted him into a long term facility (of note, similar statements made during prior ED visit).  Pt also w hx chronic, recurrent methamphetamine and cocaine use disorder - current UDS +.  Pt feels improved this AM. No current physical c/o. No problems sleeping. Normal appetite. No thoughts of harm to self or others.   Physical Exam  BP (!) 98/56 (BP Location: Left Arm)   Pulse 78   Temp 98.4 F (36.9 C)   Resp 15   Ht 1.575 m ('5\' 2"'$ )   Wt 90.7 kg   SpO2 99%   BMI 36.57 kg/m  Physical Exam General: resting, easily aroused, calm.  Cardiac: regular rate.  Lungs: breathing comfortably. Psych: normal mood and affect. Does not appear acute depressed or despondent. No SI.  Pt does not appear to be responding to internal stimuli - no acute psychosis noted.   ED Course / MDM   I have reviewed the labs performed to date as well as medications administered while in observation.  Recent changes in the last 24 hours include ED obs, metabolism, reassessment.   Plan  Charts reviewed, hx previously presenting requesting 'long term placement', and SUD.    UDS +meth and cocaine, other labs unremarkable.   Normal vitals, no distress, no acute psychosis. Pt currently appears stable for d/c.  Will provide resources re outpatient SUD and [place] f/u.  Staff/toc to contact guardian re impending discharge.   Return precautions provided.       Lajean Saver, MD 10/07/22 [id number]

## 2022-10-07 NOTE — Progress Notes (Signed)
Patient stated he is not going back to the hotel because there is nothing to do out there. Patient stated his legal guardian stated he didn't have to go back. CSW contacted patients guardian, [name], [phone] from [place] who stated they paid for the next 4 days at the hotel and if patient doesn't want to go back then she cannot force him. Martin Majestic stated it was fine to release patient from the hospital.
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
## 2022-10-07 NOTE — Discharge Instructions (Signed)
It was our pleasure to provide your ER care today - we hope that you feel better.  Avoid drug use as it is harmful to your physical health and mental well-being. See resource guide attached in terms of accessing inpatient or outpatient substance use treatment programs, and other behavioral health and social service resources.    Follow up closely with primary care doctor and behavioral health provider in the coming week.  For mental health issues and/or crisis, you may also go directly to the [facility] - they are open 24/7 and walk-ins are welcome.    Return to ER if worse, new symptoms, fevers, chest pain, trouble breathing, or other emergency concern.

## 2022-10-07 NOTE — ED Notes (Signed)
Pt continuing to pace in room and come to doorway looking at this RN. Notified [employer] of pt behaviors.

## 2022-10-07 NOTE — ED Notes (Signed)
Attempted to call legal guardian. Got voicemail and left HIPAA compliant voicemail and call back number.

## 2022-10-07 NOTE — ED Notes (Signed)
Pt pacing back and forth from his room to the bathroom. Explained to pt that I had a few things that I had to get done and then I would look into notifying his legal guardian. Pt adamant that he doesn't have a legal guardian. Again explained to give this RN a minute to finish what I'm doing so that I can look into it. Pt continues to pace in room and walk back and forth to the bathroom.

## 2022-10-07 NOTE — Progress Notes (Signed)
CSW spoke with patients legal guardian [name], [phone] from [place]. Martin Majestic stated patient resides at the [facility], [address] in [place]. Patient is in room 237. Martin Majestic stated she would call patients ACT who was supposed to follow-up with him this week to let them know he will be discharging. Patient will need to discharge by safe transport.

## 2022-10-08 ENCOUNTER — Emergency Department (HOSPITAL_COMMUNITY)
Admission: EM | Admit: 2022-10-08 | Discharge: 2022-10-08 | Disposition: A | Discharge status: Home / Self Care | Payer: Medicaid Other | Attending: Emergency Medicine | Admitting: Emergency Medicine

## 2022-10-08 ENCOUNTER — Other Ambulatory Visit: Payer: Self-pay

## 2022-10-08 DIAGNOSIS — F1510 Other stimulant abuse, uncomplicated: Secondary | ICD-10-CM

## 2022-10-08 DIAGNOSIS — F1994 Other psychoactive substance use, unspecified with psychoactive substance-induced mood disorder: Secondary | ICD-10-CM

## 2022-10-08 DIAGNOSIS — F250 Schizoaffective disorder, bipolar type: Secondary | ICD-10-CM | POA: Diagnosis not present

## 2022-10-08 DIAGNOSIS — F1410 Cocaine abuse, uncomplicated: Secondary | ICD-10-CM

## 2022-10-08 DIAGNOSIS — R440 Auditory hallucinations: Secondary | ICD-10-CM | POA: Diagnosis present

## 2022-10-08 DIAGNOSIS — F1914 Other psychoactive substance abuse with psychoactive substance-induced mood disorder: Secondary | ICD-10-CM | POA: Diagnosis not present

## 2022-10-08 NOTE — Consult Note (Signed)
[facility] Consult   Reason for Consult:  Auditory/Visual Hallucination Referring Physician:  Wilnette Kales, PA   Patient Identification: Justin Chaney MRN:  [id number] Principal Diagnosis: Schizoaffective disorder, bipolar type ([place]) Diagnosis:  Principal Problem:   Schizoaffective disorder, bipolar type ([place])   Total Time spent with patient: 45 minutes  Subjective:   Justin Chaney is a 27 y.o. male patient admitted with hearing voices and seeing things. Patient stated "I came here to get help for my mental health".  HPI: Justin Chaney is a 27 y.o patient with a history of substance induced mood disorder, amphetamine abuse, cannabis abuse, psychotic disorder, schizoaffective disorder bipolar type, ADHD, malingering, suicidal ideation, who presents to the ED complaining of hearing voices and seeing things.  Patient was seen face to face by this provider, and chart reviewed.   Per chart review, patient was admitted to [facility] for inpatient psychiatric treatment on 09/20/22 and discharged 10/01/22. On 10/06/22 patient presented to ED saying his mother wants him to go to a facility because he was having auditory and visual hallucination. Patient was discharged yesterday 10/07/22. Today patient presented back to the ED with the same complaint.   On evaluation patient is alert and oriented x3, speech is pressured. Patient's eye contact is good, mood is euthymic, affect is congruent. Patient's thought process is linear and thought content is logical. Patient denies SI, HI, or paranoia. Patient reported hearing voices and seeing things that are not there. Patient does not appear to be responding to internal stimuli and no delusion noted.    Patient reported to this provider that his family wants him to come here for inpatient care. (Legal guardian said patient does not have family here in [place]). Patient stated "I want inpatient".  He said "I am seeing things that  different people don't see and I am hearing voices".  Patient says " I do not have a guardian, the one in my note is my friend". Patient also denies that he has ACTT team, patient states " I do not have an ACTT team".  Patient reported that he stays with his family here in [place] and they wants him to get help for inpatient care. He reported that he does not take his medication because he does not have it. Per Legal guardian, patient takes Haldol shots monthly, next dose is due 10/29/22. Patient denies drug or alcohol use. Patient denies having access to gun or fire weapon.  Collateral information obtained from legal guardianRobby Chaney [phone]. She reported that earlier today patient called her, told her that he is doing great. Justin Chaney says patient does not have any family member here in [place]. She says patient has an ACTT team called [facility]. Legal guardian says that county paid for a hotel for 4 days, still active till Monday. She said patient decided to leave the hotel, she says it is his choice that she cannot force him to say there. Justin Chaney says that patient does not need inpatient care, she thinks his medication is working and patient is self aware now. She reported that before he started getting his medication, he was paranoid, guarded, but he is talking more now and aware of himself.  Legal guardian said patient has a hotel paid for until Monday at [facility] area and patient can go there.   Patient denies suicidal ideation. Patient contracts for safety. Patient does not appear to be responding to internal stimuli. Patient does not meet criteria for inpatient admission. He will be discharged  back to the hotel in [place].  Legal guardian is in agreement with the treatment plan to discharge patient back to the hotel.   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   Depression    Homelessness    Schizophrenia ([place])    Seizures ([place])    childhood seizures   Suicidal behavior    Suicidal intent     Past Surgical History:  Procedure Laterality Date   APPENDECTOMY     SHUNT REMOVAL     TEE WITHOUT CARDIOVERSION N/A 08/14/2019   Procedure: TRANSESOPHAGEAL ECHOCARDIOGRAM (TEE);  Surgeon: Pixie Casino, MD;  Location: [facility] ENDOSCOPY;  Service: Cardiovascular;  Laterality: N/A;   TONSILLECTOMY     VENTRICULO-PERITONEAL SHUNT PLACEMENT / LAPAROSCOPIC INSERTION PERITONEAL CATHETER     Family History:  Family History  Problem Relation Age of Onset   Hypertension Mother    Mental illness Neg Hx    Family Psychiatric  History: Provided Social History:  Social History   Substance and Sexual Activity  Alcohol Use No     Social History   Substance and Sexual Activity  Drug Use Yes   Types: Other-see comments, Amphetamines    Social History   Socioeconomic History   Marital status: Single    Spouse name: Not on file   Number of children: Not on file   Years of education: Not on file   Highest education level: Not on file  Occupational History   Not on file  Tobacco Use   Smoking status: Every Day    Packs/day: 0.00    Types: Cigars, Cigarettes   Smokeless tobacco: Never  Vaping Use   Vaping Use: Never used  Substance and Sexual Activity   Alcohol use: No   Drug use: Yes    Types: Other-see comments, Amphetamines   Sexual activity: Not on file  Other Topics Concern   Not on file  Social History Narrative   Not on file   Social Determinants of Health   Financial Resource Strain: Not on file  Food Insecurity: Not on file  Transportation Needs: Not  on file  Physical Activity: Not on file  Stress: Not on file  Social Connections: Not on file   Additional Social History:    Allergies:   Allergies  Allergen Reactions   Amoxicillin Hives and Other (See Comments)    CHILDHOOD ALLERGY - Per [facility] records, tolerates Ancef and Rocephin Has patient had a PCN reaction causing immediate rash, facial/tongue/throat swelling, SOB or lightheadedness with hypotension: YES Has patient had a PCN reaction causing severe rash involving mucus membranes or skin necrosis: No Has patient had a PCN reaction that required hospitalization No   Penicillin G Other (See Comments)    Reaction not cited, but the patient stated he IS allergic    Labs:  Results for orders placed or performed during the hospital encounter of 10/06/22 (from the past 48 hour(s))  Resp panel by RT-PCR (RSV, Flu A&B, Covid) Anterior Nasal Swab     Status: None   Collection Time: 10/06/22  7:19 PM   Specimen: Anterior Nasal Swab  Result Value Ref Range  SARS Coronavirus 2 by RT PCR NEGATIVE NEGATIVE   Influenza A by PCR NEGATIVE NEGATIVE   Influenza B by PCR NEGATIVE NEGATIVE    Comment: (NOTE) The Xpert Xpress SARS-CoV-2/FLU/RSV plus assay is intended as an aid in the diagnosis of influenza from Nasopharyngeal swab specimens and should not be used as a sole basis for treatment. Nasal washings and aspirates are unacceptable for Xpert Xpress SARS-CoV-2/FLU/RSV testing.  Fact Sheet for Patients: EntrepreneurPulse.com.au  Fact Sheet for Healthcare Providers: IncredibleEmployment.be  This test is not yet approved or cleared by the [place] FDA and has been authorized for detection and/or diagnosis of SARS-CoV-2 by FDA under an Emergency Use Authorization (EUA). This EUA will remain in effect (meaning this test can be used) for the duration of the COVID-19 declaration under Section 564(b)(1) of the Act, 21 U.S.C. section  360bbb-3(b)(1), unless the authorization is terminated or revoked.     Resp Syncytial Virus by PCR NEGATIVE NEGATIVE    Comment: (NOTE) Fact Sheet for Patients: EntrepreneurPulse.com.au  Fact Sheet for Healthcare Providers: IncredibleEmployment.be  This test is not yet approved or cleared by the [place] FDA and has been authorized for detection and/or diagnosis of SARS-CoV-2 by FDA under an Emergency Use Authorization (EUA). This EUA will remain in effect (meaning this test can be used) for the duration of the COVID-19 declaration under Section 564(b)(1) of the Act, 21 U.S.C. section 360bbb-3(b)(1), unless the authorization is terminated or revoked.  Performed at [facility], [place] [address] [place], [place] [address]     No current facility-administered medications for this encounter.   Current Outpatient Medications  Medication Sig Dispense Refill   benztropine (COGENTIN) 0.5 MG tablet Take 1 tablet (0.5 mg total) by mouth every 12 (twelve) hours for 14 days. (Patient not taking: Reported on 10/07/2022) 28 tablet 0   divalproex (DEPAKOTE) 500 MG DR tablet Take 1 tablet (500 mg total) by mouth every 12 (twelve) hours for 14 days. (Patient not taking: Reported on 10/07/2022) 28 tablet 0   haloperidol (HALDOL) 10 MG tablet Take 1 tablet (10 mg total) by mouth every 12 (twelve) hours for 14 days. (Patient not taking: Reported on 10/07/2022) 28 tablet 0   [START ON 10/29/2022] haloperidol decanoate (HALDOL DECANOATE) 100 MG/ML injection Inject 1 mL (100 mg total) into the muscle every 28 (twenty-eight) days for 1 dose. Next dose is due on 10-29-2022 1 mL 0   hydrOXYzine (ATARAX) 25 MG tablet Take 1 tablet (25 mg total) by mouth 3 (three) times daily as needed for anxiety. (Patient not taking: Reported on 10/07/2022) 30 tablet 0   nicotine (NICODERM CQ - DOSED IN MG/24 HOURS) 14 mg/24hr patch Place 1 patch (14 mg total) onto the skin daily as needed  (Smoking cessation). (Patient not taking: Reported on 10/07/2022) 28 patch 0    Musculoskeletal: Strength & Muscle Tone: within normal limits Gait & Station: normal Patient leans: N/A  Psychiatric Specialty Exam:  Presentation  General Appearance:  Appropriate for Environment  Eye Contact: Good  Speech: Normal Rate  Speech Volume: Normal  Handedness: -- (uncooperative, uta)   Mood and Affect  Mood: Euthymic  Affect: Congruent   Thought Process  Thought Processes: Linear  Descriptions of Associations:Intact  Orientation:Full (Time, Place and Person)  Thought Content:Logical  History of Schizophrenia/Schizoaffective disorder:Yes  Duration of Psychotic Symptoms:Greater than six months  Hallucinations:Hallucinations: None  Ideas of Reference:None  Suicidal Thoughts:Suicidal Thoughts: No  Homicidal Thoughts:Homicidal Thoughts: No   Sensorium  Memory: Immediate  Fair; Recent Fair; Remote Fair  Judgment: Fair  Insight: Fair   Materials engineer: Fair  Attention Span: Fair  Recall: [place] of Knowledge: Good  Language: Good   Psychomotor Activity  Psychomotor Activity: Psychomotor Activity: Normal   Assets  Assets: Communication Skills; Social Support; Physical Health   Sleep  Sleep: Sleep: Good   Physical Exam: Physical Exam Vitals and nursing note reviewed.  Eyes:     General:        Right eye: No discharge.        Left eye: No discharge.  Pulmonary:     Effort: No respiratory distress.     Breath sounds: No wheezing.  Neurological:     Mental Status: He is alert.     Motor: No weakness.  Psychiatric:        Attention and Perception: Attention normal.        Mood and Affect: Mood normal.        Speech: Speech normal.        Behavior: Behavior normal.        Thought Content: Thought content is not paranoid. Thought content does not include homicidal or suicidal ideation.    Review of  Systems  Constitutional:  Negative for diaphoresis and fever.  HENT:  Negative for ear discharge and hearing loss.   Eyes:  Negative for discharge and redness.  Respiratory:  Negative for shortness of breath and wheezing.   Cardiovascular:  Negative for chest pain and palpitations.  Gastrointestinal:  Negative for nausea and vomiting.  Neurological:  Negative for dizziness, seizures and weakness.  Psychiatric/Behavioral:  Negative for depression, hallucinations, substance abuse and suicidal ideas.    Blood pressure 126/81, pulse 93, temperature 98.6 F (37 C), temperature source Oral, resp. rate 16, SpO2 99 %. There is no height or weight on file to calculate BMI.  Treatment Plan Summary: Plan : Patient will be discharged back to the hotel which the county paid for. Hotel payment is still active until Monday.   Disposition: Patient does not meet criteria for psychiatric inpatient admission. Discussed crisis plan, support from social network, calling 911, coming to the Emergency Department, and calling Suicide Hotline.  Patient will be discharged back to the hotel which the [place] paid for: hotel payment is still active until Monday.   Discussed methods to reduce the risk of self-injury or suicide attempts: Frequent conversations regarding unsafe thoughts. Remove all significant sharps. Remove all firearms. Remove all medications, including over-the-counter meds. Consider lockbox for medications and having a responsible person dispense medications until patient has strengthened coping skills. Room checks for sharps or other harmful objects. Secure all chemical substances that can be ingested or inhaled.   Please refrain from using alcohol or illicit substances, as they can affect your mood and can cause depression, anxiety or other concerning symptoms.  Alcohol can increase the chance that a person will make reckless decisions, like attempting suicide, and can increase the lethality of a drug  overdose.     Discussed crisis plan, calling 911, or going to the ED if condition changes or worsens. Patient verbalized understanding.   Earney Mallet, NP 10/08/2022 6:13 PM

## 2022-10-08 NOTE — ED Notes (Signed)
Pt asking how long it will take to get a room. Explained to pt that there isn't a time that we can give him and that bed placement is based on acuity. Pt then walked to security at lobby entrance and then went and sat back in the lobby.

## 2022-10-08 NOTE — ED Provider Triage Note (Addendum)
Emergency Medicine Provider Triage Evaluation Note  Justin Chaney , a 27 y.o. male  was evaluated in triage.  Pt complains of seeing things and hearing things. Seen for same, dc yesterday, thought he would try again today. Requesting admission. Admits to meth use 1 week ago.  Cocaine use last week. Last alcohol- does not drink Review of Systems  Positive:  Negative: CP, abdominal pain, vomiting rash, SI, HI  Physical Exam  BP 126/81 (BP Location: Left Arm)   Pulse 93   Temp 98.6 F (37 C) (Oral)   Resp 16   SpO2 99%  Gen:   Awake, no distress   Resp:  Normal effort  MSK:   Moves extremities without difficulty  Other:    Medical Decision Making  Medically screening exam initiated at 2:56 PM.  Appropriate orders placed.  Shahab Beardslee was informed that the remainder of the evaluation will be completed by another provider, this initial triage assessment does not replace that evaluation, and the importance of remaining in the ED until their evaluation is complete.     Tacy Learn, PA-C 10/08/22 1457    Tacy Learn, PA-C 10/08/22 [phone]

## 2022-10-08 NOTE — ED Triage Notes (Signed)
Pt here to "try again" after being discharged from this facility yesterday. No drug use in one week but states needs help with drugs and is having AVH. Denies SI/HI.

## 2022-10-08 NOTE — Discharge Instructions (Addendum)
Note that behavioral is clear to you and you do not meet inpatient criteria.  Recommend outpatient management of symptoms.  See resources attached to set up an appointment.    Please do not hesitate to return to emergency department for worrisome signs and symptoms we discussed become apparent.

## 2022-10-08 NOTE — ED Provider Notes (Signed)
[facility] Provider Note   CSN: [id number] Arrival date & time: 10/08/22  1446     History  Chief Complaint  Patient presents with   Hallucinations    Justin Chaney is a 27 y.o. male.  HPI   27 year old male presents emergency department with complaints of "hearing and seeing things."  Patient states that he has had the symptoms for the past several months.  Reports noncompliance and lives at home medications.  States that he most recently used cocaine along with methamphetamine approximately 1 week ago.  Patient was seen 2 days ago and observed overnight until returning this morning.  Patient requesting admission for detox as well as optimizing his outpatient medications.  Denies any cocaine, marijuana, alcohol, methamphetamine use since prior discharge.  Denies homicidal/suicidal ideation.  Denies fever, chills, chest pain, shortness of breath or abdominal pain, nausea, vomiting, urinary symptoms, change in bowel habits.  Past medical history significant for bipolar disorder, schizophrenia, homelessness, seizure, polysubstance abuse, antisocial personality disorder, ADHD, psychosis  Home Medications Prior to Admission medications   Medication Sig Start Date End Date Taking? Authorizing Provider  benztropine (COGENTIN) 0.5 MG tablet Take 1 tablet (0.5 mg total) by mouth every 12 (twelve) hours for 14 days. Patient not taking: Reported on 10/07/2022 10/01/22 10/15/22  Janine Limbo, MD  divalproex (DEPAKOTE) 500 MG DR tablet Take 1 tablet (500 mg total) by mouth every 12 (twelve) hours for 14 days. Patient not taking: Reported on 10/07/2022 10/02/22 10/16/22  Janine Limbo, MD  haloperidol (HALDOL) 10 MG tablet Take 1 tablet (10 mg total) by mouth every 12 (twelve) hours for 14 days. Patient not taking: Reported on 10/07/2022 10/02/22 10/16/22  Janine Limbo, MD  haloperidol decanoate (HALDOL DECANOATE) 100 MG/ML injection Inject 1 mL  (100 mg total) into the muscle every 28 (twenty-eight) days for 1 dose. Next dose is due on 10-29-2022 10/29/22 10/30/22  Janine Limbo, MD  hydrOXYzine (ATARAX) 25 MG tablet Take 1 tablet (25 mg total) by mouth 3 (three) times daily as needed for anxiety. Patient not taking: Reported on 10/07/2022 10/01/22   Janine Limbo, MD  nicotine (NICODERM CQ - DOSED IN MG/24 HOURS) 14 mg/24hr patch Place 1 patch (14 mg total) onto the skin daily as needed (Smoking cessation). Patient not taking: Reported on 10/07/2022 10/01/22   Janine Limbo, MD      Allergies    Amoxicillin and Penicillin g    Review of Systems   Review of Systems  All other systems reviewed and are negative.   Physical Exam Updated Vital Signs BP 127/85 (BP Location: Right Arm)   Pulse 78   Temp 99.1 F (37.3 C) (Oral)   Resp 16   SpO2 100%  Physical Exam Vitals and nursing note reviewed.  Constitutional:      General: He is not in acute distress.    Appearance: He is well-developed.     Comments: Patient with linear thinking, A&O x 4 able to respond appropriately to questions.  HENT:     Head: Normocephalic and atraumatic.  Eyes:     Conjunctiva/sclera: Conjunctivae normal.  Cardiovascular:     Rate and Rhythm: Normal rate and regular rhythm.     Heart sounds: No murmur heard. Pulmonary:     Effort: Pulmonary effort is normal. No respiratory distress.     Breath sounds: Normal breath sounds.  Abdominal:     Palpations: Abdomen is soft.     Tenderness: There is no  abdominal tenderness.  Musculoskeletal:        General: No swelling.     Cervical back: Neck supple.  Skin:    General: Skin is warm and dry.     Capillary Refill: Capillary refill takes less than 2 seconds.  Neurological:     Mental Status: He is alert.  Psychiatric:        Mood and Affect: Mood normal.     ED Results / Procedures / Treatments   Labs (all labs ordered are listed, but only abnormal results are displayed) Labs Reviewed  - No data to display  EKG None  Radiology No results found.  Procedures Procedures    Medications Ordered in ED Medications - No data to display  ED Course/ Medical Decision Making/ A&P                             Medical Decision Making  This patient presents to the ED for concern of psychosis, this involves an extensive number of treatment options, and is a complaint that carries with it a high risk of complications and morbidity.  The differential diagnosis includes drug effect/withdrawal, substance use, acute psychosis, metabolic derangement   Co morbidities that complicate the patient evaluation  See HPI   Additional history obtained:  Additional history obtained from EMR External records from outside source obtained and reviewed including laboratory studies performed 2 days ago consistent with positive UDS for cocaine as well as amphetamine   Lab Tests:  N/a   Imaging Studies ordered:  N/a   Cardiac Monitoring: / EKG:  The patient was maintained on a cardiac monitor.  I personally viewed and interpreted the cardiac monitored which showed an underlying rhythm of: Sinus rhythm   Consultations Obtained:  I requested consultation with behavioral health  Problem List / ED Course / Critical interventions / Medication management  Methamphetamine/cocaine use, substance induced mood disorder, schizophrenia Reevaluation of the patient showed that the patient stayed the same I have reviewed the patients home medicines and have made adjustments as needed   Social Determinants of Health:  Polysubstance abuse.  Homelessness.  Medical noncompliance.   Test / Admission - Considered:  Methamphetamine/cocaine use, substance induced mood disorder, schizophrenia Vitals signs within normal range and stable throughout visit. Laboratory/imaging studies reviewed from 2-days ago Patient presents again to the emergency department for same complaint and was  discharged with yesterday.  No laboratory workup deemed necessary at this time given workup done 2 days ago which was positive for cocaine and methamphetamines but otherwise unremarkable.  Patient noted homelessness, polysubstance abuse, medical noncompliance.  TTS was consulted regarding patient's complaints with concerns for potential psychosis given auditory/visual hallucinations after medical clearance.  Upon psychiatric evaluation, most adequate therapy deemed outpatient; see behavioral health note for more details.  Patient recommended abstinence from substance use as well as continuing prescribed outpatient medications.  Patient given outpatient resources for follow-up regarding substance use/abuse, counseling and psychiatric assistance.  Treatment plan discussed at length with patient and he acknowledged understanding was agreeable to said plan. Worrisome signs and symptoms were discussed with the patient, and the patient acknowledged understanding to return to the ED if noticed. Patient was stable upon discharge.          Final Clinical Impression(s) / ED Diagnoses Final diagnoses:  Methamphetamine abuse ([place])  Cocaine abuse ([place])  Substance induced mood disorder ([place])    Rx / DC Orders ED Discharge Orders  None         Wilnette Kales, [place] 10/08/22 1916    Lorelle Gibbs, DO 10/09/22 1522

## 2022-10-08 NOTE — Progress Notes (Signed)
Patient has a legal guardian with [facility] [place], [name], [phone]. The county has paid for a hotel for patient but he refused to go back to the hotel yesterday 10/07/22. CSW left a message with [facility]

## 2022-10-08 NOTE — Progress Notes (Signed)
TOC CSW spoke with legal guardian with [facility], [place], [phone].  Martin Majestic stated pt can return to [facility], [address], [place], [place] [address].  Pt will receive a taxi voucher at [employer] to [facility].  Martin Majestic states DSS got the hotel for him, he has groceries and clothes there.  Pt also has [facility] ACT Team, Abby is his assigned crisis member.    Alexiz Sustaita Tarpley-Carter, MSW, LCSW-A Pronouns:  She/Her/Hers [facility] Care Clinical Social Worker Direct Number:  [phone] [email or web address]

## 2022-10-09 ENCOUNTER — Emergency Department (HOSPITAL_COMMUNITY)
Admission: EM | Admit: 2022-10-09 | Discharge: 2022-10-09 | Disposition: A | Discharge status: Home / Self Care | Payer: Medicaid Other | Attending: Emergency Medicine | Admitting: Emergency Medicine

## 2022-10-09 DIAGNOSIS — F209 Schizophrenia, unspecified: Secondary | ICD-10-CM

## 2022-10-09 DIAGNOSIS — F99 Mental disorder, not otherwise specified: Secondary | ICD-10-CM | POA: Diagnosis present

## 2022-10-09 NOTE — Discharge Instructions (Signed)
Follow up with your care team as reviewed with behavioral health yesterday. [facility] as needed.

## 2022-10-09 NOTE — ED Triage Notes (Signed)
Pt arrived via POV. Pt here for mental health check for their schizophrenia, and wants to "go inpatient". Denies SI/HI.

## 2022-10-09 NOTE — Progress Notes (Signed)
TOC consulted to contact LG to notify of pt's discharge...attempted to contact LG, left HIPAA Compliant voicemail.

## 2022-10-09 NOTE — ED Provider Notes (Signed)
[place] EMERGENCY DEPARTMENT AT [facility] Provider Note   CSN: [id number] Arrival date & time: 10/09/22  1220     History  Chief Complaint  Patient presents with   Mental Health Problem    Justin Chaney is a 27 y.o. male.  27 year old male with history of schizophrenia presents the emergency room requesting inpatient placement for hearing and seeing things that others do not see.  He denies suicidal or homicidal ideation.  Denies any other complaints medically.  Patient was screened by myself at [facility] ER yesterday after recent discharge with same request.  He was evaluated by behavioral health team and ultimately discharged.  He presents today with same request for inpatient admission.       Home Medications Prior to Admission medications   Medication Sig Start Date End Date Taking? Authorizing Provider  benztropine (COGENTIN) 0.5 MG tablet Take 1 tablet (0.5 mg total) by mouth every 12 (twelve) hours for 14 days. Patient not taking: Reported on 10/07/2022 10/01/22 10/15/22  Janine Limbo, MD  divalproex (DEPAKOTE) 500 MG DR tablet Take 1 tablet (500 mg total) by mouth every 12 (twelve) hours for 14 days. Patient not taking: Reported on 10/07/2022 10/02/22 10/16/22  Janine Limbo, MD  haloperidol (HALDOL) 10 MG tablet Take 1 tablet (10 mg total) by mouth every 12 (twelve) hours for 14 days. Patient not taking: Reported on 10/07/2022 10/02/22 10/16/22  Janine Limbo, MD  haloperidol decanoate (HALDOL DECANOATE) 100 MG/ML injection Inject 1 mL (100 mg total) into the muscle every 28 (twenty-eight) days for 1 dose. Next dose is due on 10-29-2022 10/29/22 10/30/22  Janine Limbo, MD  hydrOXYzine (ATARAX) 25 MG tablet Take 1 tablet (25 mg total) by mouth 3 (three) times daily as needed for anxiety. Patient not taking: Reported on 10/07/2022 10/01/22   Janine Limbo, MD  nicotine (NICODERM CQ - DOSED IN MG/24 HOURS) 14 mg/24hr patch Place 1 patch (14 mg total)  onto the skin daily as needed (Smoking cessation). Patient not taking: Reported on 10/07/2022 10/01/22   Janine Limbo, MD      Allergies    Amoxicillin and Penicillin g    Review of Systems   Review of Systems Negative except as per HPI Physical Exam Updated Vital Signs There were no vitals taken for this visit. Physical Exam Vitals and nursing note reviewed.  Constitutional:      General: He is not in acute distress.    Appearance: He is well-developed. He is not diaphoretic.  HENT:     Head: Normocephalic and atraumatic.  Cardiovascular:     Rate and Rhythm: Normal rate and regular rhythm.     Heart sounds: Normal heart sounds.  Pulmonary:     Effort: Pulmonary effort is normal.     Breath sounds: Normal breath sounds.  Skin:    General: Skin is warm and dry.     Findings: No erythema or rash.  Neurological:     Mental Status: He is alert and oriented to person, place, and time.  Psychiatric:        Behavior: Behavior normal.     ED Results / Procedures / Treatments   Labs (all labs ordered are listed, but only abnormal results are displayed) Labs Reviewed - No data to display  EKG None  Radiology No results found.  Procedures Procedures    Medications Ordered in ED Medications - No data to display  ED Course/ Medical Decision Making/ A&P  Medical Decision Making  27 year old male presents with request for inpatient treatment for his schizophrenia.Review of his behavioral health note from yesterday.  His guardian was contacted yesterday.  He does have a hotel room to stay at through Monday as paid for by the county.  Attempted to contact patient's guardian unsuccessfully.  I have made case management aware of patient's need for discharge and guardian contact.  Patient is advised to follow-up with his care team as previously instructed yesterday.  Provided with behavioral health urgent care should he need additional resources.   Discussed with ER attending who agrees with plan of care.        Final Clinical Impression(s) / ED Diagnoses Final diagnoses:  Schizophrenia, unspecified type [facility])    Rx / DC Orders ED Discharge Orders     None         Tacy Learn, PA-C 10/09/22 1243    Wyvonnia Dusky, MD 10/09/22 1316

## 2022-10-15 ENCOUNTER — Ambulatory Visit (HOSPITAL_COMMUNITY)
Admission: EM | Admit: 2022-10-15 | Discharge: 2022-10-17 | Disposition: A | Discharge status: Home / Self Care | Payer: Medicaid Other | Attending: Family Medicine | Admitting: Family Medicine

## 2022-10-15 DIAGNOSIS — Z1152 Encounter for screening for COVID-19: Secondary | ICD-10-CM | POA: Insufficient documentation

## 2022-10-15 DIAGNOSIS — Z5900 Homelessness unspecified: Secondary | ICD-10-CM | POA: Insufficient documentation

## 2022-10-15 DIAGNOSIS — F250 Schizoaffective disorder, bipolar type: Secondary | ICD-10-CM | POA: Insufficient documentation

## 2022-10-15 DIAGNOSIS — F259 Schizoaffective disorder, unspecified: Secondary | ICD-10-CM

## 2022-10-15 LAB — POC SARS CORONAVIRUS 2 AG: SARSCOV2ONAVIRUS 2 AG: NEGATIVE

## 2022-10-15 LAB — RESP PANEL BY RT-PCR (RSV, FLU A&B, COVID)  RVPGX2
Influenza A by PCR: NEGATIVE
Influenza B by PCR: NEGATIVE
Resp Syncytial Virus by PCR: NEGATIVE
SARS Coronavirus 2 by RT PCR: NEGATIVE

## 2022-10-15 MED ORDER — BENZTROPINE MESYLATE 0.5 MG PO TABS
0.5000 mg | ORAL_TABLET | Freq: Two times a day (BID) | ORAL | Status: DC
  Administered 2022-10-15 – 2022-10-17 (×4): 0.5 mg via ORAL
  Filled 2022-10-15: qty 1
  Filled 2022-10-15: qty 14
  Filled 2022-10-15 (×4): qty 1

## 2022-10-15 MED ORDER — ALUM & MAG HYDROXIDE-SIMETH 200-200-20 MG/5ML PO SUSP: 30.0000 mL | ORAL | Status: DC | PRN

## 2022-10-15 MED ORDER — MAGNESIUM HYDROXIDE 400 MG/5ML PO SUSP: 30.0000 mL | Freq: Every day | ORAL | Status: DC | PRN

## 2022-10-15 MED ORDER — DIVALPROEX SODIUM 500 MG PO DR TAB
500.0000 mg | DELAYED_RELEASE_TABLET | Freq: Two times a day (BID) | ORAL | Status: DC
  Administered 2022-10-15 – 2022-10-17 (×4): 500 mg via ORAL
  Filled 2022-10-15 (×5): qty 1
  Filled 2022-10-15: qty 14

## 2022-10-15 MED ORDER — HALOPERIDOL 5 MG PO TABS
10.0000 mg | ORAL_TABLET | Freq: Two times a day (BID) | ORAL | Status: DC
  Administered 2022-10-15 – 2022-10-17 (×4): 10 mg via ORAL
  Filled 2022-10-15 (×4): qty 2
  Filled 2022-10-15: qty 28
  Filled 2022-10-15: qty 2

## 2022-10-15 MED ORDER — ACETAMINOPHEN 325 MG PO TABS: 650.0000 mg | ORAL_TABLET | Freq: Four times a day (QID) | ORAL | Status: DC | PRN

## 2022-10-15 NOTE — ED Notes (Signed)
Pt sleeping at present, no distress noted.  Monitoring for safety. 
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
## 2022-10-15 NOTE — ED Triage Notes (Signed)
Pt presents to [facility] voluntarily escorted by [employer] due to medication concerns. Pt states he has been without medication for a few months. Pt reports chronic auditory hallucinations, "I hear people just walking". Pt reports being diagnosed with Bipolar disorder and Schizophrenia. Pt denies SI/HI and AVH at this time.

## 2022-10-15 NOTE — ED Notes (Signed)
Pt A&O x 4, sleeping at present, no distress noted, calm & cooperative.  Denies SI.  Monitoring for safety.

## 2022-10-15 NOTE — ED Provider Notes (Signed)
[facility]gent [facility] Continuous Assessment Admission H&P  Date: 10/15/22 Patient Name: Justin Chaney MRN: [id number] Chief Complaint:   Diagnoses:  Final diagnoses:  Schizoaffective disorder-chronic with exacerbation [facility])    HPI: Justin Chaney [age] y.o., male patient presented to [facility] as a walk in, voluntarily, accompanied by law enforcement with complaints of hearing voices and needing medication stabilizations.  [name], 27 y.o., male patient seen face to face by this provider, consulted with Dr. Dwyane Dee; and chart reviewed on 10/15/22.   Of note patient is very well known the [place] line due multiple ED admission for polysubstance use and poor medication adherence resulting in frequent exacerbations of schizophrenia symptoms.  On evaluation Justin Chaney reports that he has been hearing increased voices saying "random things". He reports that he is not currently taking any psychotropic medications, although on chart review, patient received a Haldol 100 mg LAI on 09/29/22 with recommendation for second dose Haldol 100 mg to be administered 10/01/2022, and 10/29/2022 Increase Haldol dose 200 mg. Per notes patient was to establish with [facility] ([facility] ) for ACTT team service, however, he is not following up or connecting with the ACT team. Patient also reported to this writer that he did not have a legal guardian, on chart review, patient legal guardian [name] [phone]. This Probation officer requested for LCSW to contact her to make her aware of his current location here at [facility]. Patient denies any illicit substance use and tells this Probation officer that he is unable to return to his hotel however will not elaborate as to the reason. Patient reports he doesn't interact with his family, but they provide him financial support to live in hotels and obtain food. Patient denies active SI or HI. Endorses that the voices he is hearing are making him afraid.Patient  continues to request that he goes inpatient. Given frequently for over-utilization of the ED, patient will be admitted overnight to continuous assessment unit for medication management and stabilization.   During evaluation Kenzie Sammon is (position) in no acute distress.  He is alert, oriented x 4, calm, cooperative and attentive.  His mood is dysphoric with blunted affect.  His speech is normal speech, and behavior.  Objectively there is no evidence of psychosis/mania or delusional thinking, despite patient reports of hearing voices (on chart review this is a chronic symptoms of schizoaffective disorder).  Patient is able to converse coherently, goal directed thoughts, no distractibility, or pre-occupation.  He also denies suicidal/self-harm/homicidal ideation, psychosis, and paranoia.  Patient answered question appropriately.     Total Time spent with patient: 30 minutes  Musculoskeletal  Strength & Muscle Tone: within normal limits Gait & Station: normal Patient leans: N/A  Psychiatric Specialty Exam  Presentation General Appearance:  Appropriate for Environment  Eye Contact: Good  Speech: Clear and Coherent  Speech Volume: Normal  Handedness: Right   Mood and Affect  Mood: Dysphoric  Affect: Blunt   Thought Process  Thought Processes: Coherent  Descriptions of Associations:Circumstantial  Orientation:Full (Time, Place and Person)  Thought Content:WDL  Diagnosis of Schizophrenia or Schizoaffective disorder in past: Yes  Duration of Psychotic Symptoms: Greater than six months  Hallucinations:Hallucinations: Auditory Description of Auditory Hallucinations: "Just constant random voices say random things" Unable to provide examples  Ideas of Reference:None  Suicidal Thoughts:Suicidal Thoughts: No  Homicidal Thoughts:Homicidal Thoughts: No   Sensorium  Memory: Immediate Fair  Judgment: Fair  Insight: Poor   Executive Functions   Concentration: Fair  Attention Span: Fair  Recall:  [place] of Knowledge: Fair  Language: Fair   Psychomotor Activity  Psychomotor Activity: Psychomotor Activity: Normal   Assets  Assets: Armed forces logistics/support/administrative officer; Social Support; Physical Health   Sleep  Sleep: Sleep: Fair   Nutritional Assessment (For OBS and FBC admissions only) Has the patient had a weight loss or gain of 10 pounds or more in the last 3 months?: No Does the patient have dental problems?: No Does the patient have eating habits or behaviors that may be indicators of an eating disorder including binging or inducing vomiting?: No Has the patient recently lost weight without trying?: 0 Has the patient been eating poorly because of a decreased appetite?: 0 Malnutrition Screening Tool Score: 0    Physical Exam Constitutional:      Appearance: Normal appearance.  HENT:     Head: Normocephalic.  Eyes:     Extraocular Movements: Extraocular movements intact.     Pupils: Pupils are equal, round, and reactive to light.  Cardiovascular:     Rate and Rhythm: Normal rate.  Pulmonary:     Effort: Pulmonary effort is normal.  Neurological:     General: No focal deficit present.     Mental Status: He is alert.    Review of Systems  Psychiatric/Behavioral:  Positive for hallucinations.     Blood pressure (!) 140/100, pulse 97, temperature 98.7 F (37.1 C), temperature source Oral, resp. rate 18, SpO2 100 %. There is no height or weight on file to calculate BMI.  Past Psychiatric History:    Is the patient at risk to self? No  Has the patient been a risk to self in the past 6 months? Yes .    Has the patient been a risk to self within the distant past? No   Is the patient a risk to others? No   Has the patient been a risk to others in the past 6 months? No   Has the patient been a risk to others within the distant past? No     Last Labs:  Admission on 10/06/2022, Discharged on 10/07/2022   Component Date Value Ref Range Status   Sodium 10/06/2022 139  135 - 145 mmol/L Final   Potassium 10/06/2022 3.9  3.5 - 5.1 mmol/L Final   Chloride 10/06/2022 105  98 - 111 mmol/L Final   CO2 10/06/2022 26  22 - 32 mmol/L Final   Glucose, Bld 10/06/2022 68 (L)  70 - 99 mg/dL Final   Glucose reference range applies only to samples taken after fasting for at least 8 hours.   BUN 10/06/2022 10  6 - 20 mg/dL Final   Creatinine, Ser 10/06/2022 0.78  0.61 - 1.24 mg/dL Final   Calcium 10/06/2022 9.1  8.9 - 10.3 mg/dL Final   Total Protein 10/06/2022 7.1  6.5 - 8.1 g/dL Final   Albumin 10/06/2022 4.0  3.5 - 5.0 g/dL Final   AST 10/06/2022 25  15 - 41 U/L Final   ALT 10/06/2022 22  0 - 44 U/L Final   Alkaline Phosphatase 10/06/2022 65  38 - 126 U/L Final   Total Bilirubin 10/06/2022 0.5  0.3 - 1.2 mg/dL Final   GFR, Estimated 10/06/2022 >60  >60 mL/min Final   Comment: (NOTE) Calculated using the CKD-EPI Creatinine Equation (2021)    Anion gap 10/06/2022 8  5 - 15 Final   Performed at [facility] [address], [place], [place] [address]   Alcohol, Ethyl (B) 10/06/2022 <10  <10  mg/dL Final   Comment: (NOTE) Lowest detectable limit for serum alcohol is 10 mg/dL.  For medical purposes only. Performed at [facility], [place] [address] [place], [place] [address]    Opiates 10/06/2022 NONE DETECTED  NONE DETECTED Final   Cocaine 10/06/2022 POSITIVE (A)  NONE DETECTED Final   Benzodiazepines 10/06/2022 NONE DETECTED  NONE DETECTED Final   Amphetamines 10/06/2022 POSITIVE (A)  NONE DETECTED Final   Tetrahydrocannabinol 10/06/2022 NONE DETECTED  NONE DETECTED Final   Barbiturates 10/06/2022 NONE DETECTED  NONE DETECTED Final   Comment: (NOTE) DRUG SCREEN FOR MEDICAL PURPOSES ONLY.  IF CONFIRMATION IS NEEDED FOR ANY PURPOSE, NOTIFY LAB WITHIN 5 DAYS.  LOWEST DETECTABLE LIMITS FOR URINE DRUG SCREEN Drug Class                     Cutoff (ng/mL) Amphetamine and metabolites     1000 Barbiturate and metabolites    200 Benzodiazepine                 200 Opiates and metabolites        300 Cocaine and metabolites        300 THC                            50 Performed at [facility], [place] [address] [place], [place] [address]    WBC 10/06/2022 7.8  4.0 - 10.5 K/uL Final   RBC 10/06/2022 5.18  4.22 - 5.81 MIL/uL Final   Hemoglobin 10/06/2022 14.6  13.0 - 17.0 g/dL Final   HCT 10/06/2022 44.4  39.0 - 52.0 % Final   MCV 10/06/2022 85.7  80.0 - 100.0 fL Final   MCH 10/06/2022 28.2  26.0 - 34.0 pg Final   MCHC 10/06/2022 32.9  30.0 - 36.0 g/dL Final   RDW 10/06/2022 11.4 (L)  11.5 - 15.5 % Final   Platelets 10/06/2022 228  150 - 400 K/uL Final   nRBC 10/06/2022 0.0  0.0 - 0.2 % Final   Neutrophils Relative % 10/06/2022 55  % Final   Neutro Abs 10/06/2022 4.3  1.7 - 7.7 K/uL Final   Lymphocytes Relative 10/06/2022 30  % Final   Lymphs Abs 10/06/2022 2.4  0.7 - 4.0 K/uL Final   Monocytes Relative 10/06/2022 13  % Final   Monocytes Absolute 10/06/2022 1.0  0.1 - 1.0 K/uL Final   Eosinophils Relative 10/06/2022 1  % Final   Eosinophils Absolute 10/06/2022 0.1  0.0 - 0.5 K/uL Final   Basophils Relative 10/06/2022 1  % Final   Basophils Absolute 10/06/2022 0.1  0.0 - 0.1 K/uL Final   Immature Granulocytes 10/06/2022 0  % Final   Abs Immature Granulocytes 10/06/2022 0.02  0.00 - 0.07 K/uL Final   Performed at [facility], [place] [address] [place], [place] [id number]   Salicylate Lvl [id number] <7.0 (L)  7.0 - 30.0 mg/dL Final   Performed at [facility] [address], [place], [place] [address]   Acetaminophen (Tylenol), Serum 10/06/2022 <10 (L)  10 - 30 ug/mL Final   Comment: (NOTE) Therapeutic concentrations vary significantly. A range of 10-30 ug/mL  may be an effective concentration for many patients. However, some  are best treated at concentrations outside of this range. Acetaminophen concentrations >150 ug/mL at 4 hours after ingestion  and  >50 ug/mL at 12 hours after ingestion are often associated with  toxic reactions.  Performed at [facility], [place] [address] [place], [place] [address]    Color, Urine 10/06/2022 YELLOW  YELLOW Final   APPearance 10/06/2022 CLEAR  CLEAR Final   Specific Gravity, Urine 10/06/2022 1.025  1.005 - 1.030 Final   pH 10/06/2022 6.0  5.0 - 8.0 Final   Glucose, UA 10/06/2022 NEGATIVE  NEGATIVE mg/dL Final   Hgb urine dipstick 10/06/2022 NEGATIVE  NEGATIVE Final   Bilirubin Urine 10/06/2022 NEGATIVE  NEGATIVE Final   Ketones, ur 10/06/2022 NEGATIVE  NEGATIVE mg/dL Final   Protein, ur 10/06/2022 NEGATIVE  NEGATIVE mg/dL Final   Nitrite 10/06/2022 NEGATIVE  NEGATIVE Final   Leukocytes,Ua 10/06/2022 NEGATIVE  NEGATIVE Final   Performed at [facility], [place] [address], [place], [place] [address]   SARS Coronavirus 2 by RT PCR 10/06/2022 NEGATIVE  NEGATIVE Final   Influenza A by PCR 10/06/2022 NEGATIVE  NEGATIVE Final   Influenza B by PCR 10/06/2022 NEGATIVE  NEGATIVE Final   Comment: (NOTE) The Xpert Xpress SARS-CoV-2/FLU/RSV plus assay is intended as an aid in the diagnosis of influenza from Nasopharyngeal swab specimens and should not be used as a sole basis for treatment. Nasal washings and aspirates are unacceptable for Xpert Xpress SARS-CoV-2/FLU/RSV testing.  Fact Sheet for Patients: EntrepreneurPulse.com.au  Fact Sheet for Healthcare Providers: IncredibleEmployment.be  This test is not yet approved or cleared by the [place] FDA and has been authorized for detection and/or diagnosis of SARS-CoV-2 by FDA under an Emergency Use Authorization (EUA). This EUA will remain in effect (meaning this test can be used) for the duration of the COVID-19 declaration under Section 564(b)(1) of the Act, 21 U.S.C. section 360bbb-3(b)(1), unless the authorization is terminated or revoked.     Resp Syncytial Virus by PCR 10/06/2022 NEGATIVE   NEGATIVE Final   Comment: (NOTE) Fact Sheet for Patients: EntrepreneurPulse.com.au  Fact Sheet for Healthcare Providers: IncredibleEmployment.be  This test is not yet approved or cleared by the [place] FDA and has been authorized for detection and/or diagnosis of SARS-CoV-2 by FDA under an Emergency Use Authorization (EUA). This EUA will remain in effect (meaning this test can be used) for the duration of the COVID-19 declaration under Section 564(b)(1) of the Act, 21 U.S.C. section 360bbb-3(b)(1), unless the authorization is terminated or revoked.  Performed at [facility], [place] [address], [place], [place] [address]   Admission on 09/20/2022, Discharged on 10/01/2022  Component Date Value Ref Range Status   Valproic Acid Lvl 10/01/2022 61  50.0 - 100.0 ug/mL Final   Performed at [facility], [place] [address] [place], [place] [address]  Admission on 09/17/2022, Discharged on 09/20/2022  Component Date Value Ref Range Status   SARS Coronavirus 2 by RT PCR 09/19/2022 NEGATIVE  NEGATIVE Final   Influenza A by PCR 09/19/2022 NEGATIVE  NEGATIVE Final   Influenza B by PCR 09/19/2022 NEGATIVE  NEGATIVE Final   Comment: (NOTE) The Xpert Xpress SARS-CoV-2/FLU/RSV plus assay is intended as an aid in the diagnosis of influenza from Nasopharyngeal swab specimens and should not be used as a sole basis for treatment. Nasal washings and aspirates are unacceptable for Xpert Xpress SARS-CoV-2/FLU/RSV testing.  Fact Sheet for Patients: EntrepreneurPulse.com.au  Fact Sheet for Healthcare Providers: IncredibleEmployment.be  This test is not yet approved or cleared by the [place] FDA and has been authorized for detection and/or diagnosis of SARS-CoV-2 by FDA under an Emergency Use Authorization (EUA). This EUA will remain in effect (  meaning this test can be used) for the duration of  the COVID-19 declaration under Section 564(b)(1) of the Act, 21 U.S.C. section 360bbb-3(b)(1), unless the authorization is terminated or revoked.     Resp Syncytial Virus by PCR 09/19/2022 NEGATIVE  NEGATIVE Final   Comment: (NOTE) Fact Sheet for Patients: EntrepreneurPulse.com.au  Fact Sheet for Healthcare Providers: IncredibleEmployment.be  This test is not yet approved or cleared by the [place] FDA and has been authorized for detection and/or diagnosis of SARS-CoV-2 by FDA under an Emergency Use Authorization (EUA). This EUA will remain in effect (meaning this test can be used) for the duration of the COVID-19 declaration under Section 564(b)(1) of the Act, 21 U.S.C. section 360bbb-3(b)(1), unless the authorization is terminated or revoked.  Performed at [facility], [place] [address], [place], [place] [address]    WBC 09/18/2022 8.5  4.0 - 10.5 K/uL Final   RBC 09/18/2022 4.59  4.22 - 5.81 MIL/uL Final   Hemoglobin 09/18/2022 13.3  13.0 - 17.0 g/dL Final   HCT 09/18/2022 39.3  39.0 - 52.0 % Final   MCV 09/18/2022 85.6  80.0 - 100.0 fL Final   MCH 09/18/2022 29.0  26.0 - 34.0 pg Final   MCHC 09/18/2022 33.8  30.0 - 36.0 g/dL Final   RDW 09/18/2022 11.8  11.5 - 15.5 % Final   Platelets 09/18/2022 288  150 - 400 K/uL Final   nRBC 09/18/2022 0.0  0.0 - 0.2 % Final   Neutrophils Relative % 09/18/2022 53  % Final   Neutro Abs 09/18/2022 4.5  1.7 - 7.7 K/uL Final   Lymphocytes Relative 09/18/2022 33  % Final   Lymphs Abs 09/18/2022 2.8  0.7 - 4.0 K/uL Final   Monocytes Relative 09/18/2022 10  % Final   Monocytes Absolute 09/18/2022 0.9  0.1 - 1.0 K/uL Final   Eosinophils Relative 09/18/2022 2  % Final   Eosinophils Absolute 09/18/2022 0.2  0.0 - 0.5 K/uL Final   Basophils Relative 09/18/2022 1  % Final   Basophils Absolute 09/18/2022 0.1  0.0 - 0.1 K/uL Final   Immature Granulocytes 09/18/2022 1  % Final   Abs Immature  Granulocytes 09/18/2022 0.04  0.00 - 0.07 K/uL Final   Performed at [facility], [place] [address], [place], [place] [address]   Sodium 09/18/2022 137  135 - 145 mmol/L Final   Potassium 09/18/2022 3.8  3.5 - 5.1 mmol/L Final   Chloride 09/18/2022 105  98 - 111 mmol/L Final   CO2 09/18/2022 24  22 - 32 mmol/L Final   Glucose, Bld 09/18/2022 80  70 - 99 mg/dL Final   Glucose reference range applies only to samples taken after fasting for at least 8 hours.   BUN 09/18/2022 10  6 - 20 mg/dL Final   Creatinine, Ser 09/18/2022 0.78  0.61 - 1.24 mg/dL Final   Calcium 09/18/2022 9.0  8.9 - 10.3 mg/dL Final   Total Protein 09/18/2022 6.3 (L)  6.5 - 8.1 g/dL Final   Albumin 09/18/2022 3.6  3.5 - 5.0 g/dL Final   AST 09/18/2022 25  15 - 41 U/L Final   ALT 09/18/2022 28  0 - 44 U/L Final   Alkaline Phosphatase 09/18/2022 55  38 - 126 U/L Final   Total Bilirubin 09/18/2022 <0.1 (L)  0.3 - 1.2 mg/dL Final   GFR, Estimated 09/18/2022 >60  >60 mL/min Final   Comment: (NOTE) Calculated using the CKD-EPI Creatinine Equation (2021)  Anion gap 09/18/2022 8  5 - 15 Final   Performed at [facility] [address], [place], [place] [address]   Alcohol, Ethyl (B) 09/18/2022 <10  <10 mg/dL Final   Comment: (NOTE) Lowest detectable limit for serum alcohol is 10 mg/dL.  For medical purposes only. Performed at [facility], [place] [address] [place], [place] [address]    Hgb A1c MFr Bld 09/18/2022 3.9 (L)  4.8 - 5.6 % Final   Comment: (NOTE) Pre diabetes:          5.7%-6.4%  Diabetes:              >6.4%  Glycemic control for   <7.0% adults with diabetes    Mean Plasma Glucose 09/18/2022 65.23  mg/dL Final   Performed at [facility], [place] [address] [place], [place] [address]   Cholesterol 09/18/2022 113  0 - 200 mg/dL Final   Triglycerides 09/18/2022 112  <150 mg/dL Final   HDL 09/18/2022 46  >40 mg/dL Final   Total CHOL/HDL Ratio 09/18/2022 2.5  RATIO Final   VLDL  09/18/2022 22  0 - 40 mg/dL Final   LDL Cholesterol 09/18/2022 45  0 - 99 mg/dL Final   Comment:        Total Cholesterol/HDL:CHD Risk Coronary Heart Disease Risk Table                     Men   Women  1/2 Average Risk   3.4   3.3  Average Risk       5.0   4.4  2 X Average Risk   9.6   7.1  3 X Average Risk  23.4   11.0        Use the calculated Patient Ratio above and the CHD Risk Table to determine the patient's CHD Risk.        ATP III CLASSIFICATION (LDL):  <100     mg/dL   Optimal  100-129  mg/dL   Near or Above                    Optimal  130-159  mg/dL   Borderline  160-189  mg/dL   High  >190     mg/dL   Very High Performed at [facility] [address] [place], [place] [address]    TSH 09/18/2022 0.848  0.350 - 4.500 uIU/mL Final   Comment: Performed by a 3rd Generation assay with a functional sensitivity of <=0.01 uIU/mL. Performed at [facility], [place] [address] [place], [place] [address]    SARSCOV2ONAVIRUS 2 AG 09/19/2022 NEGATIVE  NEGATIVE Final   Comment: (NOTE) SARS-CoV-2 antigen NOT DETECTED.   Negative results are presumptive.  Negative results do not preclude SARS-CoV-2 infection and should not be used as the sole basis for treatment or other patient management decisions, including infection  control decisions, particularly in the presence of clinical signs and  symptoms consistent with COVID-19, or in those who have been in contact with the virus.  Negative results must be combined with clinical observations, patient history, and epidemiological information. The expected result is Negative.  Fact Sheet for Patients: HandmadeRecipes.com.cy  Fact Sheet for Healthcare Providers: FuneralLife.at  This test is not yet approved or cleared by the [place] FDA and  has been authorized for detection and/or diagnosis of SARS-CoV-2 by FDA under an Emergency Use Authorization (EUA).  This EUA  will  remain in effect (meaning this test can be used) for the duration of  the COV                          ID-19 declaration under Section 564(b)(1) of the Act, 21 U.S.C. section 360bbb-3(b)(1), unless the authorization is terminated or revoked sooner.    Admission on 08/22/2022, Discharged on 08/22/2022  Component Date Value Ref Range Status   WBC 08/22/2022 9.4  4.0 - 10.5 K/uL Final   RBC 08/22/2022 5.33  4.22 - 5.81 MIL/uL Final   Hemoglobin 08/22/2022 15.4  13.0 - 17.0 g/dL Final   HCT 08/22/2022 45.6  39.0 - 52.0 % Final   MCV 08/22/2022 85.6  80.0 - 100.0 fL Final   MCH 08/22/2022 28.9  26.0 - 34.0 pg Final   MCHC 08/22/2022 33.8  30.0 - 36.0 g/dL Final   RDW 08/22/2022 11.9  11.5 - 15.5 % Final   Platelets 08/22/2022 196  150 - 400 K/uL Final   nRBC 08/22/2022 0.0  0.0 - 0.2 % Final   Neutrophils Relative % 08/22/2022 67  % Final   Neutro Abs 08/22/2022 6.2  1.7 - 7.7 K/uL Final   Lymphocytes Relative 08/22/2022 22  % Final   Lymphs Abs 08/22/2022 2.1  0.7 - 4.0 K/uL Final   Monocytes Relative 08/22/2022 9  % Final   Monocytes Absolute 08/22/2022 0.9  0.1 - 1.0 K/uL Final   Eosinophils Relative 08/22/2022 2  % Final   Eosinophils Absolute 08/22/2022 0.2  0.0 - 0.5 K/uL Final   Basophils Relative 08/22/2022 0  % Final   Basophils Absolute 08/22/2022 0.0  0.0 - 0.1 K/uL Final   Immature Granulocytes 08/22/2022 0  % Final   Abs Immature Granulocytes 08/22/2022 0.04  0.00 - 0.07 K/uL Final   Performed at [facility], [place] [address], [place], [place] [address]   Sodium 08/22/2022 136  135 - 145 mmol/L Final   Potassium 08/22/2022 3.4 (L)  3.5 - 5.1 mmol/L Final   Chloride 08/22/2022 106  98 - 111 mmol/L Final   CO2 08/22/2022 21 (L)  22 - 32 mmol/L Final   Glucose, Bld 08/22/2022 149 (H)  70 - 99 mg/dL Final   Glucose reference range applies only to samples taken after fasting for at least 8 hours.   BUN 08/22/2022 14  6 - 20 mg/dL Final   Creatinine, Ser 08/22/2022  0.58 (L)  0.61 - 1.24 mg/dL Final   Calcium 08/22/2022 9.2  8.9 - 10.3 mg/dL Final   Total Protein 08/22/2022 7.3  6.5 - 8.1 g/dL Final   Albumin 08/22/2022 4.3  3.5 - 5.0 g/dL Final   AST 08/22/2022 26  15 - 41 U/L Final   ALT 08/22/2022 22  0 - 44 U/L Final   Alkaline Phosphatase 08/22/2022 57  38 - 126 U/L Final   Total Bilirubin 08/22/2022 0.9  0.3 - 1.2 mg/dL Final   GFR, Estimated 08/22/2022 >60  >60 mL/min Final   Comment: (NOTE) Calculated using the CKD-EPI Creatinine Equation (2021)    Anion gap 08/22/2022 9  5 - 15 Final   Performed at [facility] [address] [place], [place] [address]   Alcohol, Ethyl (B) 08/22/2022 <10  <10 mg/dL Final   Comment: (NOTE) Lowest detectable limit for serum alcohol is 10 mg/dL.  For medical purposes only. Performed at [facility], [place] [address], [place], [place]  [id number]    Opiates 08/22/2022 NONE DETECTED  NONE DETECTED Final   Cocaine 08/22/2022 NONE DETECTED  NONE DETECTED Final   Benzodiazepines 08/22/2022 NONE DETECTED  NONE DETECTED Final   Amphetamines 08/22/2022 NONE DETECTED  NONE DETECTED Final   Tetrahydrocannabinol 08/22/2022 NONE DETECTED  NONE DETECTED Final   Barbiturates 08/22/2022 NONE DETECTED  NONE DETECTED Final   Comment: (NOTE) DRUG SCREEN FOR MEDICAL PURPOSES ONLY.  IF CONFIRMATION IS NEEDED FOR ANY PURPOSE, NOTIFY LAB WITHIN 5 DAYS.  LOWEST DETECTABLE LIMITS FOR URINE DRUG SCREEN Drug Class                     Cutoff (ng/mL) Amphetamine and metabolites    1000 Barbiturate and metabolites    200 Benzodiazepine                 200 Opiates and metabolites        300 Cocaine and metabolites        300 THC                            50 Performed at [facility], [place] [address], [place], [place] [id number]    Salicylate Lvl [id number] <7.0 (L)  7.0 - 30.0 mg/dL Final   Performed at [facility] [address], [place], [place] [address]   Acetaminophen (Tylenol), Serum  08/22/2022 <10 (L)  10 - 30 ug/mL Final   Comment: (NOTE) Therapeutic concentrations vary significantly. A range of 10-30 ug/mL  may be an effective concentration for many patients. However, some  are best treated at concentrations outside of this range. Acetaminophen concentrations >150 ug/mL at 4 hours after ingestion  and >50 ug/mL at 12 hours after ingestion are often associated with  toxic reactions.  Performed at [facility], [place] [address], [place], [place] [address]     Allergies: Amoxicillin and Penicillin g  Medications:  Facility Ordered Medications  Medication   acetaminophen (TYLENOL) tablet 650 mg   alum & mag hydroxide-simeth (MAALOX/MYLANTA) 200-200-20 MG/5ML suspension 30 mL   magnesium hydroxide (MILK OF MAGNESIA) suspension 30 mL   PTA Medications  Medication Sig   haloperidol (HALDOL) 10 MG tablet Take 1 tablet (10 mg total) by mouth every 12 (twelve) hours for 14 days. (Patient not taking: Reported on 10/07/2022)   [START ON 10/29/2022] haloperidol decanoate (HALDOL DECANOATE) 100 MG/ML injection Inject 1 mL (100 mg total) into the muscle every 28 (twenty-eight) days for 1 dose. Next dose is due on 10-29-2022   hydrOXYzine (ATARAX) 25 MG tablet Take 1 tablet (25 mg total) by mouth 3 (three) times daily as needed for anxiety. (Patient not taking: Reported on 10/07/2022)   nicotine (NICODERM CQ - DOSED IN MG/24 HOURS) 14 mg/24hr patch Place 1 patch (14 mg total) onto the skin daily as needed (Smoking cessation). (Patient not taking: Reported on 10/07/2022)   benztropine (COGENTIN) 0.5 MG tablet Take 1 tablet (0.5 mg total) by mouth every 12 (twelve) hours for 14 days. (Patient not taking: Reported on 10/07/2022)   divalproex (DEPAKOTE) 500 MG DR tablet Take 1 tablet (500 mg total) by mouth every 12 (twelve) hours for 14 days. (Patient not taking: Reported on 10/07/2022)    Medical Decision Making  Admit to continuous assessment unit restart home medications, and  re-evaluate tomorrow. Patient is here voluntarily.  Resume Haldol 10 mg BID for psychotic features, Depakote 500 mg BID  for mood stabilization , and Cogentin 0.5 mg BID EPS prophylaxis.  Psychiatry to reevaluate tomorrow morning. Recommendations  Based on my evaluation the patient does not appear to have an emergency medical condition.    Molli Barrows, FNP-C, PMHNP-BC  [facility] [phone]  10/15/22  3:44 PM

## 2022-10-15 NOTE — ED Notes (Signed)
Patient A&Ox4. Admitted to observation unit due to endorsing AH of "random noises". Pt states, :I need to get my medicine straight again". Denies SI/HI. Patient denies any physical complaints when asked. No acute distress noted. Support and encouragement provided. Routine safety checks conducted according to facility protocol. Encouraged patient to notify staff if thoughts of harm toward self or others arise. Patient verbalize understanding and agreement. Will continue to monitor for safety.

## 2022-10-16 MED ORDER — HALOPERIDOL DECANOATE 100 MG/ML IM SOLN
100.0000 mg | Freq: Once | INTRAMUSCULAR | Status: AC
  Administered 2022-10-16: 100 mg via INTRAMUSCULAR
  Filled 2022-10-16: qty 1

## 2022-10-16 NOTE — ED Notes (Signed)
Patient is sleeping. Respirations equal and unlabored, skin warm and dry, NAD. No change in assessment or acuity. Routine safety checks conducted according to facility protocol. Will continue to monitor for safety.   

## 2022-10-16 NOTE — ED Notes (Signed)
Pt is currently sleeping, no distress noted, environmental check complete, will continue to monitor patient for safety. ? ?

## 2022-10-16 NOTE — ED Notes (Signed)
Pt sleeping at present, no distress noted.  Monitoring for safety. 

## 2022-10-16 NOTE — ED Provider Notes (Signed)
Behavioral Health Progress Note  Date and Time: 10/16/2022 2:09 PM Name: Justin Chaney MRN:  [id number]  Subjective:  "I got my shot already"   Diagnosis:  Final diagnoses:  Schizoaffective disorder-chronic with exacerbation ([place])    Total Time spent with patient: 33 minutes  Justin Chaney, 27 y.o., male patient seen face to face by this provider, consulted with Dr. Dwyane Dee; and chart reviewed on 10/16/22.   Of note patient is very well known the [facility] due multiple ED admission for polysubstance use and poor medication adherence resulting in frequent exacerbations of schizophrenia symptoms.  On evaluation Justin Chaney reports that he received his Haldol injection this morning. Legal guardian Justin Chaney [phone] was contacted by Bunnie Pion from [facility] on 10/15/2022 and Ms. Monroe advised that the patient will no longer have access to a hotel room as she will not pay for hotel as patient leaves for several days and goes missing. She requested that patient receive the Haldol injection he was scheduled to receive on 10/01/2022. According to note from conversation with guardian patient can be discharged to a shelter. Patient has remained calm and cooperative since admission to continuous assessment unit.  Patient denies SI/HI/AH/VH at present. Patient will remain in observation overnight to ensure tolerating second Haldol injection.   [facility]- Psychotherapeutic services ([facility] ), contact [place] [phone]. Legal guardian Justin Chaney [phone]   Additional Social History:    Pain Medications: See d/c med list from [facility] on 10/01/22 Prescriptions: See d/c med list from [facility] on 10/01/22 Over the Counter: See d/c med list from [facility] on 10/01/22 History of alcohol / drug use?: Yes Longest period of sobriety (when/how long): Unknown Withdrawal Symptoms: None Name of Substance 1: Cocaine (Crack) 1 - Age of First Use: Unknown 1 - Amount (size/oz):  Varies 1 - Frequency: Varies 1 - Duration: On-going 1 - Last Use / Amount: Patient reports last use prior to 02/19 although he is a positive for it on his UDS. 1 - Method of Aquiring: illegal purchase 1- Route of Use: smoking Name of Substance 2: Methamphetamine 2 - Age of First Use: Unknown 2 - Amount (size/oz): Pt says he does not use much 2 - Frequency: Pt says he does not use regularly 2 - Duration: off and on 2 - Last Use / Amount: Before 09/20/22 2 - Method of Aquiring: illegal means 2 - Route of Substance Use: patient did not report               Sleep: Good  Appetite:  Good  Current Medications:  Current Facility-Administered Medications  Medication Dose Route Frequency Provider Last Rate Last Admin   acetaminophen (TYLENOL) tablet 650 mg  650 mg Oral Q6H PRN Scot Jun, NP       alum & mag hydroxide-simeth (MAALOX/MYLANTA) 200-200-20 MG/5ML suspension 30 mL  30 mL Oral Q4H PRN Scot Jun, NP       benztropine (COGENTIN) tablet 0.5 mg  0.5 mg Oral BID Scot Jun, NP   0.5 mg at 10/16/22 1122   divalproex (DEPAKOTE) DR tablet 500 mg  500 mg Oral Q12H Scot Jun, NP   500 mg at 10/16/22 1122   haloperidol (HALDOL) tablet 10 mg  10 mg Oral BID Scot Jun, NP   10 mg at 10/16/22 1122   magnesium hydroxide (MILK OF MAGNESIA) suspension 30 mL  30 mL Oral Daily PRN Scot Jun, NP  No current outpatient medications on file.    Labs  Lab Results:  Admission on 10/15/2022  Component Date Value Ref Range Status   SARS Coronavirus 2 by RT PCR 10/15/2022 NEGATIVE  NEGATIVE Final   Influenza A by PCR 10/15/2022 NEGATIVE  NEGATIVE Final   Influenza B by PCR 10/15/2022 NEGATIVE  NEGATIVE Final   Comment: (NOTE) The Xpert Xpress SARS-CoV-2/FLU/RSV plus assay is intended as an aid in the diagnosis of influenza from Nasopharyngeal swab specimens and should not be used as a sole basis for treatment. Nasal washings and aspirates  are unacceptable for Xpert Xpress SARS-CoV-2/FLU/RSV testing.  Fact Sheet for Patients: EntrepreneurPulse.com.au  Fact Sheet for Healthcare Providers: IncredibleEmployment.be  This test is not yet approved or cleared by the [place] FDA and has been authorized for detection and/or diagnosis of SARS-CoV-2 by FDA under an Emergency Use Authorization (EUA). This EUA will remain in effect (meaning this test can be used) for the duration of the COVID-19 declaration under Section 564(b)(1) of the Act, 21 U.S.C. section 360bbb-3(b)(1), unless the authorization is terminated or revoked.     Resp Syncytial Virus by PCR 10/15/2022 NEGATIVE  NEGATIVE Final   Comment: (NOTE) Fact Sheet for Patients: EntrepreneurPulse.com.au  Fact Sheet for Healthcare Providers: IncredibleEmployment.be  This test is not yet approved or cleared by the [place] FDA and has been authorized for detection and/or diagnosis of SARS-CoV-2 by FDA under an Emergency Use Authorization (EUA). This EUA will remain in effect (meaning this test can be used) for the duration of the COVID-19 declaration under Section 564(b)(1) of the Act, 21 U.S.C. section 360bbb-3(b)(1), unless the authorization is terminated or revoked.  Performed at [facility], [place] [address] [place], [place] [address]    SARSCOV2ONAVIRUS 2 AG 10/15/2022 NEGATIVE  NEGATIVE Final   Comment: (NOTE) SARS-CoV-2 antigen NOT DETECTED.   Negative results are presumptive.  Negative results do not preclude SARS-CoV-2 infection and should not be used as the sole basis for treatment or other patient management decisions, including infection  control decisions, particularly in the presence of clinical signs and  symptoms consistent with COVID-19, or in those who have been in contact with the virus.  Negative results must be combined with clinical observations, patient  history, and epidemiological information. The expected result is Negative.  Fact Sheet for Patients: HandmadeRecipes.com.cy  Fact Sheet for Healthcare Providers: FuneralLife.at  This test is not yet approved or cleared by the [place] FDA and  has been authorized for detection and/or diagnosis of SARS-CoV-2 by FDA under an Emergency Use Authorization (EUA).  This EUA will remain in effect (meaning this test can be used) for the duration of  the COV                          ID-19 declaration under Section 564(b)(1) of the Act, 21 U.S.C. section 360bbb-3(b)(1), unless the authorization is terminated or revoked sooner.    Admission on 10/06/2022, Discharged on 10/07/2022  Component Date Value Ref Range Status   Sodium 10/06/2022 139  135 - 145 mmol/L Final   Potassium 10/06/2022 3.9  3.5 - 5.1 mmol/L Final   Chloride 10/06/2022 105  98 - 111 mmol/L Final   CO2 10/06/2022 26  22 - 32 mmol/L Final   Glucose, Bld 10/06/2022 68 (L)  70 - 99 mg/dL Final   Glucose reference range applies only to samples taken after fasting for at least 8 hours.  BUN 10/06/2022 10  6 - 20 mg/dL Final   Creatinine, Ser 10/06/2022 0.78  0.61 - 1.24 mg/dL Final   Calcium 10/06/2022 9.1  8.9 - 10.3 mg/dL Final   Total Protein 10/06/2022 7.1  6.5 - 8.1 g/dL Final   Albumin 10/06/2022 4.0  3.5 - 5.0 g/dL Final   AST 10/06/2022 25  15 - 41 U/L Final   ALT 10/06/2022 22  0 - 44 U/L Final   Alkaline Phosphatase 10/06/2022 65  38 - 126 U/L Final   Total Bilirubin 10/06/2022 0.5  0.3 - 1.2 mg/dL Final   GFR, Estimated 10/06/2022 >60  >60 mL/min Final   Comment: (NOTE) Calculated using the CKD-EPI Creatinine Equation (2021)    Anion gap 10/06/2022 8  5 - 15 Final   Performed at [facility] [address] [place], [place] [address]   Alcohol, Ethyl (B) 10/06/2022 <10  <10 mg/dL Final   Comment: (NOTE) Lowest detectable limit for serum alcohol is 10  mg/dL.  For medical purposes only. Performed at [facility], [place] [address] [place], [place] [address]    Opiates 10/06/2022 NONE DETECTED  NONE DETECTED Final   Cocaine 10/06/2022 POSITIVE (A)  NONE DETECTED Final   Benzodiazepines 10/06/2022 NONE DETECTED  NONE DETECTED Final   Amphetamines 10/06/2022 POSITIVE (A)  NONE DETECTED Final   Tetrahydrocannabinol 10/06/2022 NONE DETECTED  NONE DETECTED Final   Barbiturates 10/06/2022 NONE DETECTED  NONE DETECTED Final   Comment: (NOTE) DRUG SCREEN FOR MEDICAL PURPOSES ONLY.  IF CONFIRMATION IS NEEDED FOR ANY PURPOSE, NOTIFY LAB WITHIN 5 DAYS.  LOWEST DETECTABLE LIMITS FOR URINE DRUG SCREEN Drug Class                     Cutoff (ng/mL) Amphetamine and metabolites    1000 Barbiturate and metabolites    200 Benzodiazepine                 200 Opiates and metabolites        300 Cocaine and metabolites        300 THC                            50 Performed at [facility], [place] [address], [place], [place] [address]    WBC 10/06/2022 7.8  4.0 - 10.5 K/uL Final   RBC 10/06/2022 5.18  4.22 - 5.81 MIL/uL Final   Hemoglobin 10/06/2022 14.6  13.0 - 17.0 g/dL Final   HCT 10/06/2022 44.4  39.0 - 52.0 % Final   MCV 10/06/2022 85.7  80.0 - 100.0 fL Final   MCH 10/06/2022 28.2  26.0 - 34.0 pg Final   MCHC 10/06/2022 32.9  30.0 - 36.0 g/dL Final   RDW 10/06/2022 11.4 (L)  11.5 - 15.5 % Final   Platelets 10/06/2022 228  150 - 400 K/uL Final   nRBC 10/06/2022 0.0  0.0 - 0.2 % Final   Neutrophils Relative % 10/06/2022 55  % Final   Neutro Abs 10/06/2022 4.3  1.7 - 7.7 K/uL Final   Lymphocytes Relative 10/06/2022 30  % Final   Lymphs Abs 10/06/2022 2.4  0.7 - 4.0 K/uL Final   Monocytes Relative 10/06/2022 13  % Final   Monocytes Absolute 10/06/2022 1.0  0.1 - 1.0 K/uL Final   Eosinophils Relative 10/06/2022 1  % Final   Eosinophils Absolute 10/06/2022 0.1  0.0 - 0.5 K/uL  Final   Basophils Relative 10/06/2022 1  % Final    Basophils Absolute 10/06/2022 0.1  0.0 - 0.1 K/uL Final   Immature Granulocytes 10/06/2022 0  % Final   Abs Immature Granulocytes 10/06/2022 0.02  0.00 - 0.07 K/uL Final   Performed at [facility], [place] [address] [place], [place] [id number]   Salicylate Lvl [id number] <7.0 (L)  7.0 - 30.0 mg/dL Final   Performed at [facility] [address], [place], [place] [address]   Acetaminophen (Tylenol), Serum 10/06/2022 <10 (L)  10 - 30 ug/mL Final   Comment: (NOTE) Therapeutic concentrations vary significantly. A range of 10-30 ug/mL  may be an effective concentration for many patients. However, some  are best treated at concentrations outside of this range. Acetaminophen concentrations >150 ug/mL at 4 hours after ingestion  and >50 ug/mL at 12 hours after ingestion are often associated with  toxic reactions.  Performed at [facility], [place] [address], [place], [place] [address]    Color, Urine 10/06/2022 YELLOW  YELLOW Final   APPearance 10/06/2022 CLEAR  CLEAR Final   Specific Gravity, Urine 10/06/2022 1.025  1.005 - 1.030 Final   pH 10/06/2022 6.0  5.0 - 8.0 Final   Glucose, UA 10/06/2022 NEGATIVE  NEGATIVE mg/dL Final   Hgb urine dipstick 10/06/2022 NEGATIVE  NEGATIVE Final   Bilirubin Urine 10/06/2022 NEGATIVE  NEGATIVE Final   Ketones, ur 10/06/2022 NEGATIVE  NEGATIVE mg/dL Final   Protein, ur 10/06/2022 NEGATIVE  NEGATIVE mg/dL Final   Nitrite 10/06/2022 NEGATIVE  NEGATIVE Final   Leukocytes,Ua 10/06/2022 NEGATIVE  NEGATIVE Final   Performed at [facility], [place] [address], [place], [place] [address]   SARS Coronavirus 2 by RT PCR 10/06/2022 NEGATIVE  NEGATIVE Final   Influenza A by PCR 10/06/2022 NEGATIVE  NEGATIVE Final   Influenza B by PCR 10/06/2022 NEGATIVE  NEGATIVE Final   Comment: (NOTE) The Xpert Xpress SARS-CoV-2/FLU/RSV plus assay is intended as an aid in the diagnosis of influenza from Nasopharyngeal swab specimens and should not be used as a  sole basis for treatment. Nasal washings and aspirates are unacceptable for Xpert Xpress SARS-CoV-2/FLU/RSV testing.  Fact Sheet for Patients: EntrepreneurPulse.com.au  Fact Sheet for Healthcare Providers: IncredibleEmployment.be  This test is not yet approved or cleared by the [place] FDA and has been authorized for detection and/or diagnosis of SARS-CoV-2 by FDA under an Emergency Use Authorization (EUA). This EUA will remain in effect (meaning this test can be used) for the duration of the COVID-19 declaration under Section 564(b)(1) of the Act, 21 U.S.C. section 360bbb-3(b)(1), unless the authorization is terminated or revoked.     Resp Syncytial Virus by PCR 10/06/2022 NEGATIVE  NEGATIVE Final   Comment: (NOTE) Fact Sheet for Patients: EntrepreneurPulse.com.au  Fact Sheet for Healthcare Providers: IncredibleEmployment.be  This test is not yet approved or cleared by the [place] FDA and has been authorized for detection and/or diagnosis of SARS-CoV-2 by FDA under an Emergency Use Authorization (EUA). This EUA will remain in effect (meaning this test can be used) for the duration of the COVID-19 declaration under Section 564(b)(1) of the Act, 21 U.S.C. section 360bbb-3(b)(1), unless the authorization is terminated or revoked.  Performed at [facility], [place] [address] [place], [place] [address]   Admission on 09/20/2022, Discharged on 10/01/2022  Component Date Value Ref Range Status   Valproic Acid Lvl 10/01/2022 61  50.0 - 100.0 ug/mL Final  Performed at [facility], [place] [address], [place], [place] [address]  Admission on 09/17/2022, Discharged on 09/20/2022  Component Date Value Ref Range Status   SARS Coronavirus 2 by RT PCR 09/19/2022 NEGATIVE  NEGATIVE Final   Influenza A by PCR 09/19/2022 NEGATIVE  NEGATIVE Final   Influenza B by PCR 09/19/2022 NEGATIVE   NEGATIVE Final   Comment: (NOTE) The Xpert Xpress SARS-CoV-2/FLU/RSV plus assay is intended as an aid in the diagnosis of influenza from Nasopharyngeal swab specimens and should not be used as a sole basis for treatment. Nasal washings and aspirates are unacceptable for Xpert Xpress SARS-CoV-2/FLU/RSV testing.  Fact Sheet for Patients: EntrepreneurPulse.com.au  Fact Sheet for Healthcare Providers: IncredibleEmployment.be  This test is not yet approved or cleared by the [place] FDA and has been authorized for detection and/or diagnosis of SARS-CoV-2 by FDA under an Emergency Use Authorization (EUA). This EUA will remain in effect (meaning this test can be used) for the duration of the COVID-19 declaration under Section 564(b)(1) of the Act, 21 U.S.C. section 360bbb-3(b)(1), unless the authorization is terminated or revoked.     Resp Syncytial Virus by PCR 09/19/2022 NEGATIVE  NEGATIVE Final   Comment: (NOTE) Fact Sheet for Patients: EntrepreneurPulse.com.au  Fact Sheet for Healthcare Providers: IncredibleEmployment.be  This test is not yet approved or cleared by the [place] FDA and has been authorized for detection and/or diagnosis of SARS-CoV-2 by FDA under an Emergency Use Authorization (EUA). This EUA will remain in effect (meaning this test can be used) for the duration of the COVID-19 declaration under Section 564(b)(1) of the Act, 21 U.S.C. section 360bbb-3(b)(1), unless the authorization is terminated or revoked.  Performed at [facility], [place] [address] [place], [place] [address]    WBC 09/18/2022 8.5  4.0 - 10.5 K/uL Final   RBC 09/18/2022 4.59  4.22 - 5.81 MIL/uL Final   Hemoglobin 09/18/2022 13.3  13.0 - 17.0 g/dL Final   HCT 09/18/2022 39.3  39.0 - 52.0 % Final   MCV 09/18/2022 85.6  80.0 - 100.0 fL Final   MCH 09/18/2022 29.0  26.0 - 34.0 pg Final   MCHC 09/18/2022  33.8  30.0 - 36.0 g/dL Final   RDW 09/18/2022 11.8  11.5 - 15.5 % Final   Platelets 09/18/2022 288  150 - 400 K/uL Final   nRBC 09/18/2022 0.0  0.0 - 0.2 % Final   Neutrophils Relative % 09/18/2022 53  % Final   Neutro Abs 09/18/2022 4.5  1.7 - 7.7 K/uL Final   Lymphocytes Relative 09/18/2022 33  % Final   Lymphs Abs 09/18/2022 2.8  0.7 - 4.0 K/uL Final   Monocytes Relative 09/18/2022 10  % Final   Monocytes Absolute 09/18/2022 0.9  0.1 - 1.0 K/uL Final   Eosinophils Relative 09/18/2022 2  % Final   Eosinophils Absolute 09/18/2022 0.2  0.0 - 0.5 K/uL Final   Basophils Relative 09/18/2022 1  % Final   Basophils Absolute 09/18/2022 0.1  0.0 - 0.1 K/uL Final   Immature Granulocytes 09/18/2022 1  % Final   Abs Immature Granulocytes 09/18/2022 0.04  0.00 - 0.07 K/uL Final   Performed at [facility], [place] [address], [place], [place] [address]   Sodium 09/18/2022 137  135 - 145 mmol/L Final   Potassium 09/18/2022 3.8  3.5 - 5.1 mmol/L Final   Chloride 09/18/2022 105  98 - 111 mmol/L Final   CO2 09/18/2022 24  22 - 32 mmol/L  Final   Glucose, Bld 09/18/2022 80  70 - 99 mg/dL Final   Glucose reference range applies only to samples taken after fasting for at least 8 hours.   BUN 09/18/2022 10  6 - 20 mg/dL Final   Creatinine, Ser 09/18/2022 0.78  0.61 - 1.24 mg/dL Final   Calcium 09/18/2022 9.0  8.9 - 10.3 mg/dL Final   Total Protein 09/18/2022 6.3 (L)  6.5 - 8.1 g/dL Final   Albumin 09/18/2022 3.6  3.5 - 5.0 g/dL Final   AST 09/18/2022 25  15 - 41 U/L Final   ALT 09/18/2022 28  0 - 44 U/L Final   Alkaline Phosphatase 09/18/2022 55  38 - 126 U/L Final   Total Bilirubin 09/18/2022 <0.1 (L)  0.3 - 1.2 mg/dL Final   GFR, Estimated 09/18/2022 >60  >60 mL/min Final   Comment: (NOTE) Calculated using the CKD-EPI Creatinine Equation (2021)    Anion gap 09/18/2022 8  5 - 15 Final   Performed at [facility] [address], [place], [place] [address]   Alcohol, Ethyl (B) 09/18/2022  <10  <10 mg/dL Final   Comment: (NOTE) Lowest detectable limit for serum alcohol is 10 mg/dL.  For medical purposes only. Performed at [facility], [place] [address] [place], [place] [address]    Hgb A1c MFr Bld 09/18/2022 3.9 (L)  4.8 - 5.6 % Final   Comment: (NOTE) Pre diabetes:          5.7%-6.4%  Diabetes:              >6.4%  Glycemic control for   <7.0% adults with diabetes    Mean Plasma Glucose 09/18/2022 65.23  mg/dL Final   Performed at [facility], [place] [address], [place], [place] [address]   Cholesterol 09/18/2022 113  0 - 200 mg/dL Final   Triglycerides 09/18/2022 112  <150 mg/dL Final   HDL 09/18/2022 46  >40 mg/dL Final   Total CHOL/HDL Ratio 09/18/2022 2.5  RATIO Final   VLDL 09/18/2022 22  0 - 40 mg/dL Final   LDL Cholesterol 09/18/2022 45  0 - 99 mg/dL Final   Comment:        Total Cholesterol/HDL:CHD Risk Coronary Heart Disease Risk Table                     Men   Women  1/2 Average Risk   3.4   3.3  Average Risk       5.0   4.4  2 X Average Risk   9.6   7.1  3 X Average Risk  23.4   11.0        Use the calculated Patient Ratio above and the CHD Risk Table to determine the patient's CHD Risk.        ATP III CLASSIFICATION (LDL):  <100     mg/dL   Optimal  100-129  mg/dL   Near or Above                    Optimal  130-159  mg/dL   Borderline  160-189  mg/dL   High  >190     mg/dL   Very High Performed at [facility] [address], [place], [place] [address]    TSH 09/18/2022 0.848  0.350 - 4.500 uIU/mL Final   Comment: Performed by a 3rd Generation assay with a functional sensitivity of <=0.01 uIU/mL. Performed at  [facility], [place] [address], [place], [place] [address]    SARSCOV2ONAVIRUS 2 AG 09/19/2022 NEGATIVE  NEGATIVE Final   Comment: (NOTE) SARS-CoV-2 antigen NOT DETECTED.   Negative results are presumptive.  Negative results do not preclude SARS-CoV-2 infection and should not be used as the sole basis  for treatment or other patient management decisions, including infection  control decisions, particularly in the presence of clinical signs and  symptoms consistent with COVID-19, or in those who have been in contact with the virus.  Negative results must be combined with clinical observations, patient history, and epidemiological information. The expected result is Negative.  Fact Sheet for Patients: HandmadeRecipes.com.cy  Fact Sheet for Healthcare Providers: FuneralLife.at  This test is not yet approved or cleared by the [place] FDA and  has been authorized for detection and/or diagnosis of SARS-CoV-2 by FDA under an Emergency Use Authorization (EUA).  This EUA will remain in effect (meaning this test can be used) for the duration of  the COV                          ID-19 declaration under Section 564(b)(1) of the Act, 21 U.S.C. section 360bbb-3(b)(1), unless the authorization is terminated or revoked sooner.    Admission on 08/22/2022, Discharged on 08/22/2022  Component Date Value Ref Range Status   WBC 08/22/2022 9.4  4.0 - 10.5 K/uL Final   RBC 08/22/2022 5.33  4.22 - 5.81 MIL/uL Final   Hemoglobin 08/22/2022 15.4  13.0 - 17.0 g/dL Final   HCT 08/22/2022 45.6  39.0 - 52.0 % Final   MCV 08/22/2022 85.6  80.0 - 100.0 fL Final   MCH 08/22/2022 28.9  26.0 - 34.0 pg Final   MCHC 08/22/2022 33.8  30.0 - 36.0 g/dL Final   RDW 08/22/2022 11.9  11.5 - 15.5 % Final   Platelets 08/22/2022 196  150 - 400 K/uL Final   nRBC 08/22/2022 0.0  0.0 - 0.2 % Final   Neutrophils Relative % 08/22/2022 67  % Final   Neutro Abs 08/22/2022 6.2  1.7 - 7.7 K/uL Final   Lymphocytes Relative 08/22/2022 22  % Final   Lymphs Abs 08/22/2022 2.1  0.7 - 4.0 K/uL Final   Monocytes Relative 08/22/2022 9  % Final   Monocytes Absolute 08/22/2022 0.9  0.1 - 1.0 K/uL Final   Eosinophils Relative 08/22/2022 2  % Final   Eosinophils Absolute 08/22/2022 0.2   0.0 - 0.5 K/uL Final   Basophils Relative 08/22/2022 0  % Final   Basophils Absolute 08/22/2022 0.0  0.0 - 0.1 K/uL Final   Immature Granulocytes 08/22/2022 0  % Final   Abs Immature Granulocytes 08/22/2022 0.04  0.00 - 0.07 K/uL Final   Performed at [facility], [place] [address], [place], [place] [address]   Sodium 08/22/2022 136  135 - 145 mmol/L Final   Potassium 08/22/2022 3.4 (L)  3.5 - 5.1 mmol/L Final   Chloride 08/22/2022 106  98 - 111 mmol/L Final   CO2 08/22/2022 21 (L)  22 - 32 mmol/L Final   Glucose, Bld 08/22/2022 149 (H)  70 - 99 mg/dL Final   Glucose reference range applies only to samples taken after fasting for at least 8 hours.   BUN 08/22/2022 14  6 - 20 mg/dL Final   Creatinine, Ser 08/22/2022 0.58 (L)  0.61 - 1.24 mg/dL Final   Calcium 08/22/2022 9.2  8.9 - 10.3 mg/dL  Final   Total Protein 08/22/2022 7.3  6.5 - 8.1 g/dL Final   Albumin 08/22/2022 4.3  3.5 - 5.0 g/dL Final   AST 08/22/2022 26  15 - 41 U/L Final   ALT 08/22/2022 22  0 - 44 U/L Final   Alkaline Phosphatase 08/22/2022 57  38 - 126 U/L Final   Total Bilirubin 08/22/2022 0.9  0.3 - 1.2 mg/dL Final   GFR, Estimated 08/22/2022 >60  >60 mL/min Final   Comment: (NOTE) Calculated using the CKD-EPI Creatinine Equation (2021)    Anion gap 08/22/2022 9  5 - 15 Final   Performed at [facility], [place] [address], [place], [place] [address]   Alcohol, Ethyl (B) 08/22/2022 <10  <10 mg/dL Final   Comment: (NOTE) Lowest detectable limit for serum alcohol is 10 mg/dL.  For medical purposes only. Performed at [facility], [place] [address], [place], [place] [address]    Opiates 08/22/2022 NONE DETECTED  NONE DETECTED Final   Cocaine 08/22/2022 NONE DETECTED  NONE DETECTED Final   Benzodiazepines 08/22/2022 NONE DETECTED  NONE DETECTED Final   Amphetamines 08/22/2022 NONE DETECTED  NONE DETECTED Final   Tetrahydrocannabinol 08/22/2022 NONE DETECTED  NONE DETECTED Final   Barbiturates 08/22/2022  NONE DETECTED  NONE DETECTED Final   Comment: (NOTE) DRUG SCREEN FOR MEDICAL PURPOSES ONLY.  IF CONFIRMATION IS NEEDED FOR ANY PURPOSE, NOTIFY LAB WITHIN 5 DAYS.  LOWEST DETECTABLE LIMITS FOR URINE DRUG SCREEN Drug Class                     Cutoff (ng/mL) Amphetamine and metabolites    1000 Barbiturate and metabolites    200 Benzodiazepine                 200 Opiates and metabolites        300 Cocaine and metabolites        300 THC                            50 Performed at [facility], [place] [address] [place], [place] [id number]    Salicylate Lvl [id number] <7.0 (L)  7.0 - 30.0 mg/dL Final   Performed at [facility] [address], [place], [place] [address]   Acetaminophen (Tylenol), Serum 08/22/2022 <10 (L)  10 - 30 ug/mL Final   Comment: (NOTE) Therapeutic concentrations vary significantly. A range of 10-30 ug/mL  may be an effective concentration for many patients. However, some  are best treated at concentrations outside of this range. Acetaminophen concentrations >150 ug/mL at 4 hours after ingestion  and >50 ug/mL at 12 hours after ingestion are often associated with  toxic reactions.  Performed at [facility], [place] [address], [place], [place] [address]     Blood Alcohol level:  Lab Results  Component Value Date   [facility] <10 10/06/2022   ETH <10 [id number]    Metabolic Disorder Labs: Lab Results  Component Value Date   HGBA1C 3.9 (L) 09/18/2022   MPG 65.23 09/18/2022   MPG 97 12/22/2016   Lab Results  Component Value Date   PROLACTIN 2.4 (L) 12/22/2016   PROLACTIN 8.0 09/09/2015   Lab Results  Component Value Date   CHOL 113 09/18/2022   TRIG 112 09/18/2022   HDL 46 09/18/2022   CHOLHDL 2.5 09/18/2022   VLDL 22 09/18/2022   LDLCALC 45 09/18/2022  [place] 56 12/22/2016    Therapeutic Lab Levels: Lab Results  Component Value Date   LITHIUM <0.06 (L) 08/11/2019   LITHIUM 0.06 (L) 07/22/2019   Lab Results  Component Value  Date   VALPROATE 61 10/01/2022   Lab Results  Component Value Date   CBMZ <2.0 (L) 10/12/2014   CBMZ <0.5 (L) 04/01/2014    Physical Findings   AIMS    Flowsheet Row Admission (Discharged) from 09/20/2022 in [place] [facility] Admission (Discharged) from 12/13/2019 in [place] Admission (Discharged) from 01/10/2019 in [place] [facility] Admission (Discharged) from 12/20/2016 in [place] [facility] Admission (Discharged) from 11/18/2015 in [place] Total Score 0 0 0 0 0      AUDIT    Flowsheet Row Admission (Discharged) from 12/13/2019 in [place] Admission (Discharged) from 01/10/2019 in [place] [facility] Admission (Discharged) from 12/20/2016 in [place] [facility] Admission (Discharged) from 11/06/2015 in [place] [facility] Admission (Discharged) from 10/11/2015 in [facility]  Alcohol Use Disorder Identification Test Final Score (AUDIT) 3 0 0 0 0      [place] ED from 10/15/2022 in [facility] ED from 10/09/2022 in [facility] Emergency Department at [facility] ED from 10/08/2022 in [facility] Emergency Department at [place] No Risk No Risk No Risk        Psychiatric Specialty Exam  Presentation  General Appearance:  Appropriate for Environment  Eye Contact: Good  Speech: Clear and Coherent  Speech Volume: Normal  Handedness: Right   Mood and Affect  Mood: Dysphoric  Affect: Blunt   Thought Process  Thought Processes: Coherent  Descriptions of Associations:Circumstantial  Orientation:Full (Time, Place and Person)  Thought Content:WDL  Diagnosis of Schizophrenia or Schizoaffective disorder in past: Yes  Duration  of Psychotic Symptoms: Greater than six months   Hallucinations:Hallucinations: Auditory Description of Auditory Hallucinations: "Just constant random voices say random things" Unable to provide examples  Ideas of Reference:None  Suicidal Thoughts:Suicidal Thoughts: No  Homicidal Thoughts:Homicidal Thoughts: No   Sensorium  Memory: Immediate Fair  Judgment: Fair  Insight: Poor   Executive Functions  Concentration: Fair  Attention Span: Fair  Recall: [place] of Knowledge: Fair  Language: Fair   Psychomotor Activity  Psychomotor Activity: Psychomotor Activity: Normal   Assets  Assets: Communication Skills; Social Support; Physical Health   Sleep  Sleep: Sleep: Fair   Nutritional Assessment (For OBS and FBC admissions only) Has the patient had a weight loss or gain of 10 pounds or more in the last 3 months?: No Does the patient have dental problems?: No Does the patient have eating habits or behaviors that may be indicators of an eating disorder including binging or inducing vomiting?: No Has the patient recently lost weight without trying?: 0 Has the patient been eating poorly because of a decreased appetite?: 0 Malnutrition Screening Tool Score: 0    Physical Exam  Blood pressure 122/73, pulse 85, temperature 98.4 F (36.9 C), temperature source Oral, resp. rate 18, SpO2 99 %. There is no height or weight on file to calculate BMI.  Physical Exam Constitutional:      Appearance: Normal appearance.  HENT:     Head: Normocephalic.  Eyes:     Extraocular Movements: Extraocular movements intact.  Pupils: Pupils are equal, round, and reactive to light.  Cardiovascular:     Rate and Rhythm: Normal rate.  Pulmonary:     Effort: Pulmonary effort is normal.  Neurological:     General: No focal deficit present.     Mental Status: He is alert.      Review of Systems  Psychiatric/Behavioral:  Positive for hallucinations.    Treatment  Plan Summary: Medication management and discharge to shelter 10/17/2022.  Haldol decanoate 100 mg given this morning, next dose dose 11/06/2022. Patient should continue oral haldol 10 mg twice daily for 10 days given poor compliance history Continue Cogentin and Depakote as prescribed.    Molli Barrows, FNP-C, PMHNP-BC  [facility] [phone]  10/16/2022 2:09 PM

## 2022-10-17 MED ORDER — HALOPERIDOL 10 MG PO TABS: 10.0000 mg | ORAL_TABLET | Freq: Two times a day (BID) | ORAL | 0 refills | Status: AC

## 2022-10-17 MED ORDER — DIVALPROEX SODIUM 500 MG PO DR TAB: 500.0000 mg | DELAYED_RELEASE_TABLET | Freq: Two times a day (BID) | ORAL | 0 refills | Status: AC

## 2022-10-17 MED ORDER — BENZTROPINE MESYLATE 0.5 MG PO TABS: 0.5000 mg | ORAL_TABLET | Freq: Two times a day (BID) | ORAL | 0 refills | Status: AC

## 2022-10-17 NOTE — ED Notes (Signed)
Patient  sleeping in no acute stress. RR even and unlabored .Environment secured .Will continue to monitor for safely. 

## 2022-10-17 NOTE — ED Notes (Addendum)
Patient A&O x 4, ambulatory. Patient discharged in no acute distress. Patient denied SI/HI, A/VH upon discharge. Patient verbalized understanding of all discharge instructions explained by staff, to include follow up appointments, RX's and safety plan. Patient reported mood 10/10.  Pt belongings returned to patient from locker #  23 intact. Patient escorted to lobby via staff for transport to destination. Safety maintained. Provider spoke with guardian about discharge and she states that it is ok to discharge him with a bus pass. Provider was unable to contact Abby with  ACT team. Guardian states that she will contact the act team. Rn also advise patient to reach out to his guardian and Abby with the act team.Patient also receive medication from pharmacy and rn went over medication with him.

## 2022-10-17 NOTE — ED Notes (Signed)
Provider is talking with patient

## 2022-10-17 NOTE — ED Notes (Signed)
Patient states that he want to be discharged ,Rn notified the provider.

## 2022-10-17 NOTE — ED Notes (Signed)
Patient alert and oriented x 3. Denies SI/HI/AVH. Denies intent or plan to harm self or others. Routine conducted according to faculty protocol. Encourage patient to notify staff with any needs or concerns. Patient verbalized agreement and understanding. Will continue to monitor for safety. 

## 2022-10-17 NOTE — Discharge Instructions (Addendum)
PLEASE FOLLOW UP WITH LEGAL GUARDIAN [place] [phone] and [facility] act team Abby [phone]   Haldol decanoate 100 mg IM given 10/16/2022 next dose dose 11/06/2022.

## 2022-10-17 NOTE — ED Provider Notes (Signed)
FBC/OBS ASAP Discharge Summary  Date and Time: 10/17/2022 10:44 AM  Name: Justin Chaney  MRN:  [id number]   Discharge Diagnoses:  Final diagnoses:  Schizoaffective disorder-chronic with exacerbation [facility])   HPI: Justin Chaney [age] y.o., male patient who initially presented to [facility] as a walk in on 10/15/2022, voluntarily, accompanied by law enforcement with complaints of hearing voices and needing medication stabilizations.  He was admitted to the continuous assessment unit.   Justin Chaney, 27 y.o., male patient seen face to face by this provider and chart reviewed on 10/17/22.   Per chart review patient has a legal guardian with [facility] [phone] and he has newly established with [facility] for ACTT team service.   Subjective:   During evaluation Justin Chaney is observed sitting in his bed on the unit in no acute distress.  He is alert/oriented x 4, cooperative, and attentive.  He is fairly anxious.  He denies any depressive symptoms but has a dysphoric affect.  He reports "feeling better".  States he believes that the injection he received helps him to feel better.  He is requesting to be discharged.  He has normal speech and behavior.  He is denying SI/HI/AVH.  Reports the auditory hallucinations he was experiencing upon admission have resolved.  He contracts for safety and denies any access to firearms/weapons.  Objectively there is no evidence of psychosis/mania or delusional thinking.  Patient is able to converse coherently, goal directed thoughts, no distractibility, or pre-occupation.    Patient answered question appropriately.    Collateral: legal guardian with [facility] [phone].  She has no immediate safety concerns with patient being discharged.  States that she has provided patient with hotel rooms in the past but he keeps leaving them.  She has given permission for patient to be discharged with a bus pass.  States he would  either go to [facility] or he will go to the bus depot where other homeless people stay.  She agrees to follow-up with patient tomorrow.  She will also contact [facility] and have them follow-up with patient for medication management.  Stay Summary: Justin Chaney was admitted to observation for auditory hallucinations and crisis management.  He was treated with the following medications Cogentin 0.5 mg twice daily, Depakote 500 mg twice daily, Haldol 10 mg twice daily, and he received a monthly Haldol decanoate IM injection.  His next injection will be due on 11/06/2022 medications were tolerated with no adverse reactions.  Justin Chaney was discharged with current medication and was instructed on how to take medications as prescribed.  He was given 7-day sample for each medication.  With instructions to follow-up with [facility] ACTT team in the a.m. Improvement was monitored by observation and Justin Chaney report of symptom reduction and patient's statement of doing better.  In addition his emotional and mental status was also monitored by staff.    Justin Chaney will follow up with the services as listed below under Follow up Information.     Upon completion of this admission the Justin Chaney was both mentally and medically stable for discharge denying suicidal/homicidal ideation, auditory/visual/tactile hallucinations, delusional thoughts and paranoia.      Total Time spent with patient: 30 minutes  Past Psychiatric History: as documented in H&P Past Medical History: as documented in H&P Family History: as documented in H&P Family Psychiatric History:as documented in H&P Social History: as documented in H&P   Tobacco Cessation:  N/A, patient does not currently use tobacco  products  Current Medications:  Current Facility-Administered Medications  Medication Dose Route Frequency Provider Last Rate Last Admin   acetaminophen (TYLENOL) tablet 650 mg  650 mg Oral Q6H PRN Scot Jun, NP        alum & mag hydroxide-simeth (MAALOX/MYLANTA) 200-200-20 MG/5ML suspension 30 mL  30 mL Oral Q4H PRN Scot Jun, NP       benztropine (COGENTIN) tablet 0.5 mg  0.5 mg Oral BID Scot Jun, NP   0.5 mg at 10/17/22 0920   divalproex (DEPAKOTE) DR tablet 500 mg  500 mg Oral Q12H Scot Jun, NP   500 mg at 10/17/22 0920   haloperidol (HALDOL) tablet 10 mg  10 mg Oral BID Scot Jun, NP   10 mg at 10/17/22 0920   magnesium hydroxide (MILK OF MAGNESIA) suspension 30 mL  30 mL Oral Daily PRN Scot Jun, NP       No current outpatient medications on file.    PTA Medications:  Facility Ordered Medications  Medication   acetaminophen (TYLENOL) tablet 650 mg   alum & mag hydroxide-simeth (MAALOX/MYLANTA) 200-200-20 MG/5ML suspension 30 mL   magnesium hydroxide (MILK OF MAGNESIA) suspension 30 mL   haloperidol (HALDOL) tablet 10 mg   benztropine (COGENTIN) tablet 0.5 mg   divalproex (DEPAKOTE) DR tablet 500 mg   [COMPLETED] haloperidol decanoate (HALDOL DECANOATE) 100 MG/ML injection 100 mg        No data to display          [place] ED from 10/15/2022 in [facility] ED from 10/09/2022 in [name] Emergency Department at [facility] ED from 10/08/2022 in [facility] Emergency Department at [place] No Risk No Risk No Risk       Musculoskeletal  Strength & Muscle Tone: within normal limits Gait & Station: normal Patient leans: N/A  Psychiatric Specialty Exam  Presentation  General Appearance:  Casual  Eye Contact: Good  Speech: Clear and Coherent; Normal Rate  Speech Volume: Normal  Handedness: Right   Mood and Affect  Mood: Dysphoric  Affect: Congruent   Thought Process  Thought Processes: Coherent  Descriptions of Associations:Intact  Orientation:Full (Time, Place and Person)  Thought Content:Logical  Diagnosis of Schizophrenia or  Schizoaffective disorder in past: Yes  Duration of Psychotic Symptoms: Greater than six months   Hallucinations:Hallucinations: None Description of Auditory Hallucinations: denies today  Ideas of Reference:None  Suicidal Thoughts:Suicidal Thoughts: No  Homicidal Thoughts:Homicidal Thoughts: No   Sensorium  Memory: Immediate Fair; Recent Fair; Remote Fair  Judgment: Fair  Insight: Fair   Community education officer  Concentration: Fair  Attention Span: Fair  Recall: [place] of Knowledge: Fair  Language: Good   Psychomotor Activity  Psychomotor Activity: Psychomotor Activity: Normal   Assets  Assets: Social Support; Resilience; Physical Health   Sleep  Sleep: Sleep: Good   No data recorded  Physical Exam  Physical Exam Vitals and nursing note reviewed.  Constitutional:      General: He is not in acute distress.    Appearance: He is well-developed.  HENT:     Head: Normocephalic and atraumatic.  Eyes:     General:        Right eye: No discharge.        Left eye: No discharge.  Cardiovascular:     Rate and Rhythm: Normal rate.  Pulmonary:     Effort: Pulmonary effort is normal. No respiratory  distress.  Musculoskeletal:        General: Normal range of motion.     Cervical back: Normal range of motion.  Neurological:     Mental Status: He is alert and oriented to person, place, and time.  Psychiatric:        Attention and Perception: Attention and perception normal.        Mood and Affect: Mood is anxious.        Speech: Speech normal.        Behavior: Behavior normal. Behavior is cooperative.        Thought Content: Thought content normal.        Cognition and Memory: Cognition normal.        Judgment: Judgment is impulsive.    Review of Systems  Constitutional: Negative.   HENT: Negative.    Eyes: Negative.   Respiratory: Negative.    Cardiovascular: Negative.   Gastrointestinal: Negative.   Genitourinary: Negative.    Musculoskeletal: Negative.   Skin: Negative.   Neurological: Negative.   Psychiatric/Behavioral:  The patient is nervous/anxious.    Blood pressure 113/73, pulse 72, temperature 98.4 F (36.9 C), temperature source Oral, resp. rate 16, SpO2 99 %. There is no height or weight on file to calculate BMI.  Demographic Factors:  Male, Adolescent or young adult, Low socioeconomic status, and Unemployed  Loss Factors: NA  Historical Factors: NA  Risk Reduction Factors:   Positive social support, Positive therapeutic relationship, and Positive coping skills or problem solving skills  Continued Clinical Symptoms:  Depression:   Impulsivity Schizophrenia:   Less than 81 years old Paranoid or undifferentiated type More than one psychiatric diagnosis Previous Psychiatric Diagnoses and Treatments  Cognitive Features That Contribute To Risk:  None    Suicide Risk:  Minimal: No identifiable suicidal ideation.  Patients presenting with no risk factors but with morbid ruminations; may be classified as minimal risk based on the severity of the depressive symptoms  Plan Of Care/Follow-up recommendations:  Activity:  as tolerated  Diet:  regular   Disposition:   Discharge patient.    Patient provided with 7-day sample for Cogentin 0.5 mg twice daily, Haldol 10 mg twice daily, and Depakote 500 mg twice daily.  Haldol decanoate 100 mg IM given 10/16/2022 next dose dose 11/06/2022.   Legal guardian contacted [name] Monro.  She is aware of patient's medications and states she will contact [facility] ACT services in the a.m. to follow-up with patient regarding medication management.  She has given permission for patient to be discharged with a bus pass.  Revonda Humphrey, NP 10/17/2022, 10:44 AM

## 2022-10-17 NOTE — ED Notes (Signed)
Provider states that she spoke with legal guardian and that she is ok with him being discharged. Provider was not able to contact the Act team,but guardian states that she will follow up with them.

## 2022-10-17 NOTE — ED Notes (Signed)
Patient is sleeping. Respirations equal and unlabored, skin warm and dry, NAD. No change in assessment or acuity. Routine safety checks conducted according to facility protocol. Will continue to monitor for safety.   

## 2022-10-31 ENCOUNTER — Emergency Department (HOSPITAL_COMMUNITY): Payer: Medicaid Other

## 2022-10-31 ENCOUNTER — Emergency Department (HOSPITAL_COMMUNITY)
Admission: EM | Admit: 2022-10-31 | Discharge: 2022-10-31 | Disposition: A | Discharge status: Home / Self Care | Payer: Medicaid Other | Attending: Emergency Medicine | Admitting: Emergency Medicine

## 2022-10-31 ENCOUNTER — Other Ambulatory Visit: Payer: Self-pay

## 2022-10-31 DIAGNOSIS — F1910 Other psychoactive substance abuse, uncomplicated: Secondary | ICD-10-CM

## 2022-10-31 DIAGNOSIS — F1510 Other stimulant abuse, uncomplicated: Secondary | ICD-10-CM | POA: Insufficient documentation

## 2022-10-31 DIAGNOSIS — S0181XA Laceration without foreign body of other part of head, initial encounter: Secondary | ICD-10-CM | POA: Diagnosis present

## 2022-10-31 DIAGNOSIS — S01112A Laceration without foreign body of left eyelid and periocular area, initial encounter: Secondary | ICD-10-CM | POA: Diagnosis not present

## 2022-10-31 DIAGNOSIS — R4781 Slurred speech: Secondary | ICD-10-CM | POA: Insufficient documentation

## 2022-10-31 DIAGNOSIS — F1410 Cocaine abuse, uncomplicated: Secondary | ICD-10-CM | POA: Diagnosis not present

## 2022-10-31 DIAGNOSIS — R55 Syncope and collapse: Secondary | ICD-10-CM | POA: Diagnosis not present

## 2022-10-31 DIAGNOSIS — Z23 Encounter for immunization: Secondary | ICD-10-CM | POA: Insufficient documentation

## 2022-10-31 DIAGNOSIS — Y900 Blood alcohol level of less than 20 mg/100 ml: Secondary | ICD-10-CM | POA: Diagnosis not present

## 2022-10-31 LAB — CBC WITH DIFFERENTIAL/PLATELET
Abs Immature Granulocytes: 0.15 10*3/uL — ABNORMAL HIGH (ref 0.00–0.07)
Basophils Absolute: 0.1 10*3/uL (ref 0.0–0.1)
Basophils Relative: 1 %
Eosinophils Absolute: 0.1 10*3/uL (ref 0.0–0.5)
Eosinophils Relative: 1 %
HCT: 41.1 % (ref 39.0–52.0)
Hemoglobin: 13.6 g/dL (ref 13.0–17.0)
Immature Granulocytes: 1 %
Lymphocytes Relative: 18 %
Lymphs Abs: 3.5 10*3/uL (ref 0.7–4.0)
MCH: 28.5 pg (ref 26.0–34.0)
MCHC: 33.1 g/dL (ref 30.0–36.0)
MCV: 86 fL (ref 80.0–100.0)
Monocytes Absolute: 2.2 10*3/uL — ABNORMAL HIGH (ref 0.1–1.0)
Monocytes Relative: 11 %
Neutro Abs: 13.3 10*3/uL — ABNORMAL HIGH (ref 1.7–7.7)
Neutrophils Relative %: 68 %
Platelets: 250 10*3/uL (ref 150–400)
RBC: 4.78 MIL/uL (ref 4.22–5.81)
RDW: 12.1 % (ref 11.5–15.5)
WBC: 19.3 10*3/uL — ABNORMAL HIGH (ref 4.0–10.5)
nRBC: 0 % (ref 0.0–0.2)

## 2022-10-31 LAB — COMPREHENSIVE METABOLIC PANEL
ALT: 26 U/L (ref 0–44)
AST: 32 U/L (ref 15–41)
Albumin: 3.6 g/dL (ref 3.5–5.0)
Alkaline Phosphatase: 63 U/L (ref 38–126)
Anion gap: 11 (ref 5–15)
BUN: 13 mg/dL (ref 6–20)
CO2: 25 mmol/L (ref 22–32)
Calcium: 8.5 mg/dL — ABNORMAL LOW (ref 8.9–10.3)
Chloride: 101 mmol/L (ref 98–111)
Creatinine, Ser: 0.78 mg/dL (ref 0.61–1.24)
GFR, Estimated: >60 mL/min (ref >60)
Glucose, Bld: 145 mg/dL — ABNORMAL HIGH (ref 70–99)
Potassium: 3.5 mmol/L (ref 3.5–5.1)
Sodium: 137 mmol/L (ref 135–145)
Total Bilirubin: 0.6 mg/dL (ref 0.3–1.2)
Total Protein: 6.8 g/dL (ref 6.5–8.1)

## 2022-10-31 LAB — RAPID URINE DRUG SCREEN, HOSP PERFORMED
Amphetamines: POSITIVE — AB
Barbiturates: NOT DETECTED
Benzodiazepines: NOT DETECTED
Cocaine: POSITIVE — AB
Opiates: NOT DETECTED
Tetrahydrocannabinol: NOT DETECTED

## 2022-10-31 LAB — I-STAT CHEM 8, ED
BUN: 14 mg/dL (ref 6–20)
Calcium, Ion: 1.13 mmol/L — ABNORMAL LOW (ref 1.15–1.40)
Chloride: 101 mmol/L (ref 98–111)
Creatinine, Ser: 0.7 mg/dL (ref 0.61–1.24)
Glucose, Bld: 142 mg/dL — ABNORMAL HIGH (ref 70–99)
HCT: 42 % (ref 39.0–52.0)
Hemoglobin: 14.3 g/dL (ref 13.0–17.0)
Potassium: 3.3 mmol/L — ABNORMAL LOW (ref 3.5–5.1)
Sodium: 140 mmol/L (ref 135–145)
TCO2: 28 mmol/L (ref 22–32)

## 2022-10-31 LAB — ETHANOL: Alcohol, Ethyl (B): <10 mg/dL (ref <10)

## 2022-10-31 MED ORDER — LIDOCAINE-EPINEPHRINE (PF) 2 %-1:200000 IJ SOLN
20.0000 mL | Freq: Once | INTRAMUSCULAR | Status: AC
  Administered 2022-10-31: 20 mL
  Filled 2022-10-31: qty 20

## 2022-10-31 MED ORDER — TETANUS-DIPHTH-ACELL PERTUSSIS 5-2.5-18.5 LF-MCG/0.5 IM SUSY
0.5000 mL | PREFILLED_SYRINGE | Freq: Once | INTRAMUSCULAR | Status: AC
  Administered 2022-10-31: 0.5 mL via INTRAMUSCULAR
  Filled 2022-10-31: qty 0.5

## 2022-10-31 NOTE — Discharge Instructions (Addendum)
Stitches were placed in your eyebrow.  They will dissolve, you do not need to have them removed.  You may bathe as normal.

## 2022-10-31 NOTE — ED Notes (Signed)
Patient transported to CT scan . 

## 2022-10-31 NOTE — ED Notes (Signed)
EDP at bedside suturing patient's laceration .
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
## 2022-10-31 NOTE — Progress Notes (Signed)
Chaplain responded to Trauma page, pt was not alert at time of visit, no further assessment made and no family present.  [place], [place]   10/31/22 0200  Spiritual Encounters  Type of Visit Initial  Care provided to: Pt not available  Referral source Trauma page  Reason for visit Trauma  OnCall Visit Yes
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
## 2024-03-30 ENCOUNTER — Encounter (HOSPITAL_COMMUNITY): Payer: Self-pay | Admitting: Emergency Medicine

## 2024-03-30 ENCOUNTER — Emergency Department (HOSPITAL_COMMUNITY)
Admission: EM | Admit: 2024-03-30 | Discharge: 2024-04-03 | Disposition: A | Discharge status: Other Acute Inpatient Hospital | Payer: MEDICAID | Attending: Emergency Medicine | Admitting: Emergency Medicine

## 2024-03-30 ENCOUNTER — Other Ambulatory Visit: Payer: Self-pay

## 2024-03-30 DIAGNOSIS — Z79899 Other long term (current) drug therapy: Secondary | ICD-10-CM | POA: Diagnosis not present

## 2024-03-30 DIAGNOSIS — R45851 Suicidal ideations: Secondary | ICD-10-CM | POA: Insufficient documentation

## 2024-03-30 DIAGNOSIS — R45850 Homicidal ideations: Secondary | ICD-10-CM | POA: Insufficient documentation

## 2024-03-30 DIAGNOSIS — R456 Violent behavior: Secondary | ICD-10-CM | POA: Insufficient documentation

## 2024-03-30 DIAGNOSIS — F250 Schizoaffective disorder, bipolar type: Secondary | ICD-10-CM | POA: Diagnosis not present

## 2024-03-30 DIAGNOSIS — F259 Schizoaffective disorder, unspecified: Secondary | ICD-10-CM

## 2024-03-30 DIAGNOSIS — F209 Schizophrenia, unspecified: Secondary | ICD-10-CM | POA: Diagnosis present

## 2024-03-30 LAB — CBC
HCT: 42.2 % (ref 39.0–52.0)
Hemoglobin: 13.8 g/dL (ref 13.0–17.0)
MCH: 27.7 pg (ref 26.0–34.0)
MCHC: 32.7 g/dL (ref 30.0–36.0)
MCV: 84.7 fL (ref 80.0–100.0)
Platelets: 232 K/uL (ref 150–400)
RBC: 4.98 MIL/uL (ref 4.22–5.81)
RDW: 11.9 % (ref 11.5–15.5)
WBC: 9.6 K/uL (ref 4.0–10.5)
nRBC: 0 % (ref 0.0–0.2)

## 2024-03-30 LAB — COMPREHENSIVE METABOLIC PANEL WITH GFR
ALT: 28 U/L (ref 0–44)
AST: 25 U/L (ref 15–41)
Albumin: 4 g/dL (ref 3.5–5.0)
Alkaline Phosphatase: 71 U/L (ref 38–126)
Anion gap: 11 (ref 5–15)
BUN: 9 mg/dL (ref 6–20)
CO2: 24 mmol/L (ref 22–32)
Calcium: 9.1 mg/dL (ref 8.9–10.3)
Chloride: 107 mmol/L (ref 98–111)
Creatinine, Ser: 0.75 mg/dL (ref 0.61–1.24)
GFR, Estimated: >60 mL/min (ref >60)
Glucose, Bld: 114 mg/dL — ABNORMAL HIGH (ref 70–99)
Potassium: 4.3 mmol/L (ref 3.5–5.1)
Sodium: 142 mmol/L (ref 135–145)
Total Bilirubin: 0.5 mg/dL (ref 0.0–1.2)
Total Protein: 6.3 g/dL — ABNORMAL LOW (ref 6.5–8.1)

## 2024-03-30 LAB — SALICYLATE LEVEL: Salicylate Lvl: <7 mg/dL — ABNORMAL LOW (ref 7.0–30.0)

## 2024-03-30 LAB — ETHANOL: Alcohol, Ethyl (B): <15 mg/dL (ref <15)

## 2024-03-30 LAB — ACETAMINOPHEN LEVEL: Acetaminophen (Tylenol), Serum: <10 ug/mL — ABNORMAL LOW (ref 10–30)

## 2024-03-30 MED ORDER — DIPHENHYDRAMINE HCL 50 MG/ML IJ SOLN
50.0000 mg | Freq: Once | INTRAMUSCULAR | Status: AC
  Administered 2024-03-30: 50 mg via INTRAMUSCULAR
  Filled 2024-03-30: qty 1

## 2024-03-30 MED ORDER — ZIPRASIDONE MESYLATE 20 MG IM SOLR
20.0000 mg | Freq: Once | INTRAMUSCULAR | Status: AC
  Administered 2024-03-30: 20 mg via INTRAMUSCULAR
  Filled 2024-03-30: qty 20

## 2024-03-30 MED ORDER — HALOPERIDOL LACTATE 5 MG/ML IJ SOLN
5.0000 mg | Freq: Once | INTRAMUSCULAR | Status: AC
  Administered 2024-03-30: 5 mg via INTRAMUSCULAR
  Filled 2024-03-30: qty 1

## 2024-03-30 MED ORDER — LORAZEPAM 2 MG/ML IJ SOLN
2.0000 mg | Freq: Once | INTRAMUSCULAR | Status: AC
  Administered 2024-03-30: 2 mg via INTRAMUSCULAR
  Filled 2024-03-30: qty 1

## 2024-03-30 MED ORDER — STERILE WATER FOR INJECTION IJ SOLN
INTRAMUSCULAR | Status: AC
  Filled 2024-03-30: qty 10

## 2024-03-30 NOTE — ED Notes (Signed)
 Pt refusing to change out, allow staff to obtain labs or EKG. Pt asked to change clothes multiple times and pt lays on bed. Provider informed, orders for medications and restraints given. Security at bedside to assist.

## 2024-03-30 NOTE — ED Notes (Signed)
 Belongings placed in cabinets above 9-25 nurse station
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
## 2024-03-30 NOTE — ED Triage Notes (Signed)
 Patient BIB [employer] with IVC and hallucinations. Per IVC patient auditory and visual hallucinations, Aggressive towards guardian. Patient refused VS in triage.

## 2024-03-30 NOTE — BH Assessment (Signed)
 Attempted to complete TTS consult, advised by RN that patient is still asleep from receiving Geodon . Advised RN to reach out via secure chat if patient awakes later to complete his assessment.
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
## 2024-03-30 NOTE — ED Notes (Signed)
 Patient refused VS, refused blood work.
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
## 2024-03-30 NOTE — BH Assessment (Signed)
 Patient was noted to be agitated and combative on arrival. IM medications were administered with patient being to somnolent earlier to be seen. Patient will be assessed later this date. Patient has been IVCed.

## 2024-03-31 MED ORDER — DIVALPROEX SODIUM 500 MG PO DR TAB
500.0000 mg | DELAYED_RELEASE_TABLET | Freq: Two times a day (BID) | ORAL | Status: DC
  Administered 2024-03-31 – 2024-04-02 (×4): 500 mg via ORAL
  Filled 2024-03-31 (×4): qty 1

## 2024-03-31 MED ORDER — OLANZAPINE 5 MG PO TBDP
5.0000 mg | ORAL_TABLET | Freq: Two times a day (BID) | ORAL | Status: DC
  Administered 2024-03-31 – 2024-04-02 (×4): 5 mg via ORAL
  Filled 2024-03-31 (×4): qty 1
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
## 2024-03-31 NOTE — ED Provider Notes (Signed)
 Emergency Medicine Observation Re-evaluation Note  Justin Chaney is a 28 y.o. male, seen on rounds today.  Pt initially presented to the ED for complaints of hx meth and cocaine abuse and related mood/behavior disorder. Currently calm, awaiting BH eval.   Physical Exam  BP 96/62   Pulse 73   Temp 97.6 F (36.4 C) (Axillary)   Resp 14   SpO2 96%  Physical Exam General: calm, nad.  Cardiac: regular rate. Lungs: breathing comfortably. Psych: calm.     I have reviewed the labs performed to date as well as medications administered while in observation.  Recent changes in the last 24 hours include ED obs, med management, reassessment.   Plan  BH eval is pending.    Justin Balcerzak, MD 03/31/24 [phone]

## 2024-03-31 NOTE — ED Notes (Signed)
 Patient willing took his medications once he woke up and ate his breakfast

## 2024-03-31 NOTE — ED Notes (Signed)
 Call received from [facility]. They wanted to make us  aware they have no beds for placement.

## 2024-03-31 NOTE — ED Notes (Signed)
 Pt refusing vital signs

## 2024-03-31 NOTE — ED Notes (Signed)
Patient to room .

## 2024-03-31 NOTE — Progress Notes (Signed)
 Inpatient Psychiatric Referral   Patient was recommended inpatient per Staci Kerns, NP. There are no appropriate beds at [facility], per [facility] AC-Linsey, RN. Patient was referred to the following out of network facilities:  Destination  Service Provider Request Status Services Address Phone Fax Patient Preferred  [facility]  Pending - Request Sent -- [address], [place] [address] [phone] --  [facility]  Pending - Request Sent -- [address] [place] [address] [phone] --  [facility]  Pending - Request Sent -- [address] [place] [address] [phone] --  [facility]  Pending - Request Sent -- [address] [place], [place] [address] [phone] --  [facility]  Pending - Request Sent -- [address] [place], [place] [address] [phone] --  [facility]  Pending - Request Sent -- [address] [place] [address] [phone] --  [facility]  Pending - Request Sent -- [address] [place] [address] [phone] --  [facility]  Pending - Request Sent -- [address], [place] [address] [phone] --  [facility]  Pending - Request Sent -- [address], [place] [address] [phone] --  [facility]  Pending - Request Sent -- [place] [phone] --  [facility]  Pending - Request Sent -- [address] [place] [address] [phone] --  [facility]  Pending - Request Sent -- [address] [place] [address] [phone] --  [facility]  Pending - Request Sent -- [address] [place] [phone] --   [facility] [address] [facility]  Pending - Request Sent -- [address] [place], [place] [address] [phone] --  [facility]  Pending - Request Sent -- [address], [place] [address] [phone] --  [facility]  Pending - Request Sent -- [address], [place] [address] [phone] --  [facility]  Pending - Request Sent -- [address] [place] [address] [phone] --  [facility]  Pending - Request Sent -- [address] [place], [place] [address] [phone] --  [facility]  Pending - Request Sent -- [address], [place] [address] [phone] --  [facility]  Pending - Request Sent -- [address] [place] [address] [phone] --  [facility]  Pending - Request [address] [place] [address] [phone] --  [facility]  Pending - Request Sent -- [address] [place] [address] [phone] --  [facility]  Pending - Request Sent -- [address] [place] [address] [phone] --  [facility]  Pending - No Request Sent -- [place] [phone] -- --

## 2024-03-31 NOTE — ED Notes (Signed)
 Pt given dinner tray.

## 2024-03-31 NOTE — ED Notes (Signed)
 Patients belongings removed from cabinet and moved to locker 29 in tcu   2 bags with labels

## 2024-03-31 NOTE — ED Notes (Signed)
 Breakfast tray given.

## 2024-03-31 NOTE — ED Notes (Signed)
Patient refused Vital Signs

## 2024-04-01 LAB — URINE DRUG SCREEN
Amphetamines: NEGATIVE
Barbiturates: NEGATIVE
Benzodiazepines: POSITIVE — AB
Cocaine: NEGATIVE
Fentanyl: NEGATIVE
Methadone Scn, Ur: NEGATIVE
Opiates: NEGATIVE
Tetrahydrocannabinol: NEGATIVE

## 2024-04-01 MED ORDER — DIPHENHYDRAMINE HCL 50 MG/ML IJ SOLN
50.0000 mg | Freq: Four times a day (QID) | INTRAMUSCULAR | Status: DC | PRN
Start: 2024-04-01 — End: 2024-04-03

## 2024-04-01 MED ORDER — HALOPERIDOL LACTATE 5 MG/ML IJ SOLN
5.0000 mg | Freq: Four times a day (QID) | INTRAMUSCULAR | Status: DC | PRN
Start: 2024-04-01 — End: 2024-04-03

## 2024-04-01 NOTE — ED Notes (Signed)
 Patient has been alert this shift but has been sleeping, breaths equal and unlabored. Patient medication compliant with encouragement, Patient has been cooperative with direction.  Patient guarded and quiet at this time.

## 2024-04-01 NOTE — ED Provider Notes (Signed)
 Attempted to contact patient's legal guardian [facility], x 2 today, no answer left the HIPAA compliant voicemail.

## 2024-04-01 NOTE — Progress Notes (Signed)
 Inpatient Psychiatric Referral   Patient was recommended inpatient per Staci Kerns, NP. There are no appropriate beds at [facility], per [facility] AC-Lin[facility], RN. Patient was referred to the following out of network facilities:  Software engineer Address Phone Fax Patient Preferred  [facility]   -- [address] [place] [address] [phone] --  [facility]   -- [address] [place] [address] [phone] --  [facility]   -- [address], [place] [address] [phone] --  [facility]   -- [address] [place], [place] [address] [phone] --  [facility]   -- [address] [place], [place] [address] [phone] --  [facility]   -- [address] [place] [address] [phone] --  [facility]   -- [address], [place] [address] [phone] --  [facility]   -- [address] [place] [address] [phone] --  [facility]   -- [address] [place] [address] [phone] --  [facility]   -- [place] [phone] --  [facility]   -- [address] [place] [address] [phone] --  [facility]   -- [address], [place] [address] [phone] --  [facility]   -- [address] [place] [phone] --  [facility]   -- [address] [place], [place] [address] [phone] --  [facility]   -- [address] [place] [address] [phone] --  [facility]   -- [address] [place] [address]  [phone] --  [facility]   -- [address], [place] [address] [phone] --  [facility]   -- [address], [place] [address] [phone] --  [facility]   -- [address], [place] [address] [phone] --  [facility] [address]   -- [address] [place] [address] [phone] --  [facility] [address]   -- [place] [address] [phone] --  [facility]   -- [address] [place] [address] [phone] --  [facility]   -- [address] [place] [address] [phone] [facility]   -- [place] [phone] -- --

## 2024-04-01 NOTE — ED Notes (Signed)
 PT goes to the shower

## 2024-04-01 NOTE — ED Notes (Signed)
 PT competed shower

## 2024-04-01 NOTE — ED Notes (Signed)
 Meal given, PT came out of room asking for pencil and paper.  Paper provided, crayon offered, PT refused

## 2024-04-01 NOTE — ED Provider Notes (Signed)
 Emergency Medicine Observation Re-evaluation Note  Justin Chaney is a 28 y.o. male, seen on rounds today.  Pt initially presented to the ED for complaints of IVC and Hallucinations Currently, the patient is resting, pulls sheet over self when asked how pt is doing.  Physical Exam  BP 96/62   Pulse 73   Temp 97.6 F (36.4 C) (Axillary)   Resp 14   SpO2 96%  Physical Exam General: NAD Cardiac: regular rate Lungs: equal chest rise Psych: calm  ED Course / MDM  EKG:   I have reviewed the labs performed to date as well as medications administered while in observation.  Recent changes in the last 24 hours include none.  Plan  Current plan is for psychiatric placement.    Francesca Elsie CROME, MD 04/01/24 [phone]

## 2024-04-01 NOTE — ED Notes (Signed)
 Patient has been cooperative this shift. Patient stays in his room and sleeps. Noted restlessness.  No aggression or agitation noted. No suicidal ideation noted.  No homicidal ideation noted.  No hallucinations noted.

## 2024-04-02 DIAGNOSIS — F250 Schizoaffective disorder, bipolar type: Secondary | ICD-10-CM

## 2024-04-02 NOTE — ED Notes (Signed)
 Call received from [facility]    they are interested in this patient    they said they would reach out to get this patient transported to them

## 2024-04-02 NOTE — ED Notes (Signed)
 Sheriffs dept called and left message for transport 9/2 after 8am

## 2024-04-02 NOTE — ED Notes (Signed)
 Pt has been accepted [facility] T[facility]ROW [facility]/20[facility], Bed assignment: Main campus   Pt meets inpatient criteria per: Jadeka Mangrum NP   Attending Physician will be Millie Manners, MD   Report can be called to: [phone] (this is a pager, please leave call-back number when giving report)   Pt can arrive after 8 AM   Care Team Notified: [facility] NP, [facility], Sydelle Kerns RN   [place] LCSW-A    04/02/2024 12:55 PM

## 2024-04-02 NOTE — Progress Notes (Signed)
 LCSW Progress Note  [id number]   Justin Chaney  04/02/2024  10:09 AM  Description:   Inpatient Psychiatric Referral  Patient was recommended inpatient per Cathaleen Jacobson (NP). There [facility] available beds at [facility], per [facility]). Patient was referred to the following out of network facilities:    [facility] Provider Address Phone Fax  [facility]  [address], [place] [address] [phone]  [facility]  [address] [place] [address] [phone]  [facility]  [address], [place] [address] [phone]  [facility]  [address] [place], [place] [address] [phone]  [facility]  [address] [place], [place] [address] [phone]  [facility]  [address] [address] [phone]  [facility]  [address] [place] [address] [phone]  [facility]  [address], [place] [address] [phone]  [facility]  [address] [place] [address] [phone]  [facility]  [place] [phone]  [facility]  [address], [place] [address] [phone]  [facility]  [address], [place] [address] [phone]  [facility]  [address] [place] [phone]  [facility]  [address] [place], [place] [address] [phone]  [facility]  [address], [place] [address] [phone]  [facility]  [address] [place], [place] [address] [phone]   [facility]  [address] [place] [address] [phone]  [facility]  [address], [place] [address] [phone]  [facility]  [address] [place] [address] [phone]  [facility] [address]  [address], [place] [address] [phone]  [facility] [address]  [place] [address] [phone]  [facility]  [address] [place] [address] [phone]  [facility]  [address] [place] [address] [phone]  [facility]  [place] [phone] --      Situation ongoing, CSW to continue following and update chart as more information becomes available.     [place] Brylee Mcgreal, MSW, LCSW  04/02/2024 10:09 AM

## 2024-04-02 NOTE — ED Provider Notes (Signed)
 Emergency Medicine Observation Re-evaluation Note  Danil Wedge is a 28 y.o. male, seen on rounds today.  Pt initially presented to the ED for complaints of IVC and Hallucinations Currently, the patient is resting.  Physical Exam  BP 105/63 (BP Location: Left Arm)   Pulse 79   Temp 98.2 F (36.8 C) (Oral)   Resp 18   SpO2 99%  Physical Exam General: Calm Cardiac: Well perfused Lungs: Even respirations Psych: Calm  ED Course / MDM  EKG:   I have reviewed the labs performed to date as well as medications administered while in observation.  Recent changes in the last 24 hours include patient is calm and cooperative.  Plan  Current plan is for inpatient psychiatry.    Darra Fonda MATSU, MD 04/05/24 [phone]

## 2024-04-02 NOTE — ED Notes (Signed)
 Patients belongings were moved from room 29 to room 37   two bags
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
## 2024-04-02 NOTE — Progress Notes (Signed)
 Pt has been accepted [facility] TOMORROW 04/03/2024, Bed assignment: Main campus   Pt meets inpatient criteria per: Jadeka Mangrum NP   Attending Physician will be Millie Manners, MD   Report can be called to: [phone] (this is a pager, please leave call-back number when giving report)   Pt can arrive after 8 AM   Care Team Notified: [facility] NP, [facility], Sydelle Kerns RN   [place] Dorean Daniello LCSW-A    04/02/2024 12:55 PM

## 2024-04-03 NOTE — ED Provider Notes (Signed)
 Emergency Medicine Observation Re-evaluation Note  Justin Chaney is a 28 y.o. male, seen on rounds today.  Pt initially presented to the ED for complaints of IVC and Hallucinations Currently, the patient is resting.  Physical Exam  BP (!) 137/95 (BP Location: Right Arm)   Pulse (!) 101   Temp 97.9 F (36.6 C) (Oral)   Resp 18   SpO2 99%  Physical Exam General: No distress Lungs: Normal effort Psych: No agitation.  Randomly starts clapping at times.  ED Course / MDM  EKG:   I have reviewed the labs performed to date as well as medications administered while in observation.  No recent changes in the last 24 hours.  Plan  [facility].    Freddi Hamilton, MD 04/03/24 [phone]

## 2024-04-03 NOTE — ED Notes (Signed)
 Pt would not stretch his legs out for accurate blood pressure.
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
## 2024-04-03 NOTE — ED Notes (Signed)
 Pt requested graham crackers. Pt previously advised there would be no more snacks. Pt said ok and began repeatedly clapping his hands in a loud fashion.

## 2024-04-03 NOTE — ED Notes (Signed)
 Paged [facility]

## 2024-04-03 NOTE — ED Notes (Signed)
Report called to [facility].

## 2024-08-18 ENCOUNTER — Emergency Department (HOSPITAL_COMMUNITY)

## 2024-08-18 ENCOUNTER — Inpatient Hospital Stay (HOSPITAL_COMMUNITY): Admitting: Anesthesiology

## 2024-08-18 ENCOUNTER — Inpatient Hospital Stay (HOSPITAL_COMMUNITY)

## 2024-08-18 DIAGNOSIS — S36500A Unspecified injury of ascending [right] colon, initial encounter: Secondary | ICD-10-CM | POA: Diagnosis not present

## 2024-08-18 DIAGNOSIS — R509 Fever, unspecified: Secondary | ICD-10-CM

## 2024-08-18 DIAGNOSIS — E669 Obesity, unspecified: Secondary | ICD-10-CM

## 2024-08-18 DIAGNOSIS — S7291XA Unspecified fracture of right femur, initial encounter for closed fracture: Secondary | ICD-10-CM | POA: Diagnosis present

## 2024-08-18 DIAGNOSIS — Z6836 Body mass index (BMI) 36.0-36.9, adult: Secondary | ICD-10-CM | POA: Diagnosis not present

## 2024-08-18 DIAGNOSIS — I469 Cardiac arrest, cause unspecified: Secondary | ICD-10-CM

## 2024-08-18 DIAGNOSIS — T07XXXA Unspecified multiple injuries, initial encounter: Secondary | ICD-10-CM

## 2024-08-18 LAB — COMPREHENSIVE METABOLIC PANEL WITH GFR
ALT: 92 U/L — ABNORMAL HIGH (ref 0–44)
AST: 129 U/L — ABNORMAL HIGH (ref 15–41)
Albumin: 4 g/dL (ref 3.5–5.0)
Alkaline Phosphatase: 69 U/L (ref 38–126)
Anion gap: 13 (ref 5–15)
BUN: 13 mg/dL (ref 6–20)
CO2: 25 mmol/L (ref 22–32)
Calcium: 8.6 mg/dL — ABNORMAL LOW (ref 8.9–10.3)
Chloride: 104 mmol/L (ref 98–111)
Creatinine, Ser: 1.13 mg/dL (ref 0.61–1.24)
GFR, Estimated: >60 mL/min
Glucose, Bld: 127 mg/dL — ABNORMAL HIGH (ref 70–99)
Potassium: 3.3 mmol/L — ABNORMAL LOW (ref 3.5–5.1)
Sodium: 142 mmol/L (ref 135–145)
Total Bilirubin: 0.5 mg/dL (ref 0.0–1.2)
Total Protein: 6.7 g/dL (ref 6.5–8.1)

## 2024-08-18 LAB — CBC
HCT: 41.6 % (ref 39.0–52.0)
Hemoglobin: 13.8 g/dL (ref 13.0–17.0)
MCH: 28 pg (ref 26.0–34.0)
MCHC: 33.2 g/dL (ref 30.0–36.0)
MCV: 84.6 fL (ref 80.0–100.0)
Platelets: 292 10*3/uL (ref 150–400)
RBC: 4.92 MIL/uL (ref 4.22–5.81)
RDW: 12.5 % (ref 11.5–15.5)
WBC: 14.1 10*3/uL — ABNORMAL HIGH (ref 4.0–10.5)
nRBC: 0 % (ref 0.0–0.2)

## 2024-08-18 LAB — ETHANOL: Alcohol, Ethyl (B): <15 mg/dL

## 2024-08-18 LAB — I-STAT CHEM 8, ED
BUN: 14 mg/dL (ref 6–20)
Calcium, Ion: 1.09 mmol/L — ABNORMAL LOW (ref 1.15–1.40)
Chloride: 102 mmol/L (ref 98–111)
Creatinine, Ser: 1.2 mg/dL (ref 0.61–1.24)
Glucose, Bld: 124 mg/dL — ABNORMAL HIGH (ref 70–99)
HCT: 43 % (ref 39.0–52.0)
Hemoglobin: 14.6 g/dL (ref 13.0–17.0)
Potassium: 3.2 mmol/L — ABNORMAL LOW (ref 3.5–5.1)
Sodium: 142 mmol/L (ref 135–145)
TCO2: 25 mmol/L (ref 22–32)

## 2024-08-18 LAB — PROTIME-INR
INR: 1.2 (ref 0.8–1.2)
Prothrombin Time: 16 s — ABNORMAL HIGH (ref 11.4–15.2)

## 2024-08-18 LAB — I-STAT CG4 LACTIC ACID, ED: Lactic Acid, Venous: 2.4 mmol/L (ref 0.5–1.9)

## 2024-08-18 LAB — PREPARE WHOLE BLOOD

## 2024-08-18 LAB — ABO/RH: ABO/RH(D): A POS

## 2024-08-18 MED ORDER — SODIUM CHLORIDE 0.9% IV SOLUTION: Freq: Once | INTRAVENOUS | Status: DC

## 2024-08-18 MED ORDER — CEFAZOLIN SODIUM-DEXTROSE 2-3 GM-%(50ML) IV SOLR
INTRAVENOUS | Status: DC | PRN
  Administered 2024-08-18: 2 g via INTRAVENOUS

## 2024-08-18 MED ORDER — METHOCARBAMOL 1000 MG/10ML IJ SOLN: 500.0000 mg | Freq: Three times a day (TID) | INTRAMUSCULAR | Status: DC

## 2024-08-18 MED ORDER — SENNA 8.6 MG PO TABS: 1.0000 | ORAL_TABLET | Freq: Two times a day (BID) | ORAL | Status: DC

## 2024-08-18 MED ORDER — FENTANYL CITRATE (PF) 50 MCG/ML IJ SOSY: 50.0000 ug | PREFILLED_SYRINGE | INTRAMUSCULAR | Status: DC | PRN

## 2024-08-18 MED ORDER — FENTANYL CITRATE (PF) 50 MCG/ML IJ SOSY: 25.0000 ug | PREFILLED_SYRINGE | Freq: Once | INTRAMUSCULAR | Status: DC

## 2024-08-18 MED ORDER — LACTATED RINGERS IV SOLN: INTRAVENOUS | Status: DC

## 2024-08-18 MED ORDER — ROCURONIUM BROMIDE 100 MG/10ML IV SOLN
INTRAVENOUS | Status: DC | PRN
  Administered 2024-08-18: 30 mg via INTRAVENOUS
  Administered 2024-08-18: 50 mg via INTRAVENOUS
  Administered 2024-08-19: 20 mg via INTRAVENOUS

## 2024-08-18 MED ADMIN — Fentanyl Citrate Preservative Free (PF) Inj 100 MCG/2ML: 100 ug | INTRAVENOUS | NDC 72572017025

## 2024-08-18 MED ADMIN — Fentanyl Citrate Preservative Free (PF) Inj 100 MCG/2ML: 50 ug | INTRAVENOUS | NDC 72572017001

## 2024-08-18 MED ADMIN — Fentanyl Citrate-NaCl 0.9% IV Soln 2.5 MG/250ML: 150 ug/h | INTRAVENOUS | NDC 73177010225

## 2024-08-18 MED ADMIN — Etomidate IV Soln 2 MG/ML: 20 mg | INTRAVENOUS | NDC 55150022110

## 2024-08-18 MED ADMIN — Iohexol IV Soln 350 MG/ML: 75 mL | INTRAVENOUS | NDC 00407141490

## 2024-08-18 MED ADMIN — Calcium Chloride Inj 10%: 1 g | INTRAVENOUS | NDC 76329330401

## 2024-08-18 MED ADMIN — Phenylephrine-NaCl IV Solution 20 MG/250ML-0.9%: 25 ug/min | INTRAVENOUS | NDC 70004080840

## 2024-08-18 MED ADMIN — Propofol IV Emul 1000 MG/100ML (10 MG/ML): 10 ug | INTRAVENOUS | NDC 00069024801

## 2024-08-18 MED ADMIN — Succinylcholine Chloride Inj 20 MG/ML: 180 mg | INTRAVENOUS | NDC 71288071911

## 2024-08-18 MED ADMIN — Ketamine HCl Inj 10 MG/ML: 150 mg | INTRAVENOUS | NDC 42023011310

## 2024-08-18 MED FILL — Fentanyl Citrate-NaCl 0.9% IV Soln 2.5 MG/250ML: 0.0000 ug/h | INTRAVENOUS | Qty: 250 | Status: AC

## 2024-08-18 MED FILL — Metoprolol Tartrate IV Soln 5 MG/5ML: 5.0000 mg | INTRAVENOUS | Qty: 5 | Status: AC

## 2024-08-18 MED FILL — Sennosides Tab 8.6 MG: 1.0000 | ORAL | Qty: 1 | Status: AC

## 2024-08-18 MED FILL — Propofol IV Emul 1000 MG/100ML (10 MG/ML): 0.0000 ug/kg/min | INTRAVENOUS | Qty: 100 | Status: AC

## 2024-08-18 MED FILL — Propofol IV Emul 1000 MG/100ML (10 MG/ML): 0.0000 ug/kg/min | INTRAVENOUS | Qty: 200 | Status: AC

## 2024-08-18 MED FILL — Propofol IV Emul 1000 MG/100ML (10 MG/ML): 0.0000 ug/kg/min | INTRAVENOUS | Qty: 100 | Status: CN

## 2024-08-18 MED FILL — Fentanyl Citrate PF Soln Prefilled Syringe 50 MCG/ML: INTRAMUSCULAR | Qty: 2 | Status: AC

## 2024-08-18 MED FILL — Polyethylene Glycol 3350 Oral Packet 17 GM: 17.0000 g | ORAL | Qty: 1 | Status: AC

## 2024-08-18 MED FILL — Hydralazine HCl Inj 20 MG/ML: 10.0000 mg | INTRAMUSCULAR | Qty: 1 | Status: AC

## 2024-08-18 MED FILL — Propofol IV Emul 200 MG/20ML (10 MG/ML): INTRAVENOUS | Qty: 20 | Status: AC

## 2024-08-18 MED FILL — Ondansetron HCl Inj 4 MG/2ML (2 MG/ML): 4.0000 mg | INTRAMUSCULAR | Qty: 2 | Status: AC

## 2024-08-18 MED FILL — Propofol IV Emul 1000 MG/100ML (10 MG/ML): INTRAVENOUS | Qty: 100 | Status: AC

## 2024-08-18 MED FILL — Ketamine HCl Soln Pref Syr 50 MG/5ML (10 MG/ML): INTRAMUSCULAR | Qty: 15 | Status: AC

## 2024-08-18 MED FILL — Midazolam HCl Inj 2 MG/2ML (Base Equivalent): INTRAMUSCULAR | Qty: 2 | Status: CN

## 2024-08-18 MED FILL — Fentanyl Citrate Preservative Free (PF) Inj 250 MCG/5ML: INTRAMUSCULAR | Qty: 5 | Status: AC

## 2024-08-18 NOTE — Progress Notes (Signed)
 Orthopedic Tech Progress Note Patient Details:  Justin Chaney 1995-08-17 [id number]  Patient ID: [name], male   DOB: July 05, 1996, 28 y.o.   MRN: [id number] I attended trauma page. Chandra Dorn PARAS 08/18/2024, 10:27 PM

## 2024-08-18 NOTE — Progress Notes (Signed)
 Pt transported from ED Tr B to OR without complications.

## 2024-08-18 NOTE — ED Notes (Signed)
 Report given to OR by [name] RN

## 2024-08-18 NOTE — Anesthesia Preprocedure Evaluation (Addendum)
 "                                  Anesthesia Evaluation  Patient identified by MRN, date of birth, ID band Patient unresponsive    Reviewed: Allergy & Precautions, Patient's Chart, lab work & pertinent test results, Unable to perform ROS - Chart review onlyPreop documentation limited or incomplete due to emergent nature of procedure.  Airway Mallampati: Intubated  TM Distance: >3 FB Neck ROM: Full    Dental   Pulmonary neg pulmonary ROS   + rhonchi        Cardiovascular negative cardio ROS  Rhythm:Regular Rate:Tachycardia  TTE 08/27/2019  1. Left ventricular ejection fraction, by visual estimation, is 60 to 65%.  Left ventricular septal wall thickness was moderately increased.  Mildly increased left ventricular posterior wall thickness. There is  mildly increased left ventricular wall thickness.   2. The left ventricle has no regional wall motion abnormalities.   3. Global right ventricle has mildly reduced systolic function.The right  ventricular size is moderately enlarged.   4. Hypokinetic right ventricular apex.   5. Trivial mitral valve regurgitation.   6. Tricuspid valve regurgitation is mild.   7. Tricuspid valve regurgitation is mild.   8. Normal pulmonary artery systolic pressure.   9. The inferior vena cava is normal in size with greater than 50%  respiratory variability, suggesting right atrial pressure of 3 mmHg.     TEE 08/14/2019  1. Left ventricular ejection fraction, by visual estimation, is 60 to 65%.  The left ventricle has normal function. There is no left ventricular hypertrophy.   2. The left ventricle has no regional wall motion abnormalities.   3. Global right ventricle has normal systolic function.The right ventricular size  is normal. No increase in right ventricular wall thickness.   4. Left atrial size was normal.   5. Right atrial size was normal.   6. The pericardial effusion is circumferential.   7. Trivial pericardial effusion  is present.   8. The mitral valve is grossly normal. Trivial mitral valve regurgitation.   9. Tricuspid valve visualized with 2D/3D modes.  10. Moderately sized vegetation on the tricuspid valve.  11. The tricuspid valve is abnormal.  12. The aortic valve is tricuspid. Aortic valve regurgitation is not visualized.  13. The pulmonic valve was grossly normal. Pulmonic valve regurgitation is  trivial.  14. Findings consistent with Tricuspid valve endocarditis.      Neuro/Psych negative neurological ROS     GI/Hepatic negative GI ROS, Neg liver ROS,,,  Endo/Other  negative endocrine ROS    Renal/GU negative Renal ROS     Musculoskeletal negative musculoskeletal ROS (+)    Abdominal  (+) + obese  Peds  Hematology negative hematology ROS (+)   Anesthesia Other Findings   Reproductive/Obstetrics                              Anesthesia Physical Anesthesia Plan  ASA: 3 and emergent  Anesthesia Plan: General   Post-op Pain Management:    Induction: Intravenous  PONV Risk Score and Plan: 4 or greater and Treatment may vary due to age or medical condition and Ondansetron   Airway Management Planned: Oral ETT  Additional Equipment: Arterial line  Intra-op Plan:   Post-operative Plan: Post-operative intubation/ventilation  Informed Consent:      Only emergency history available  and History available from chart only  Plan Discussed with: CRNA  Anesthesia Plan Comments: (Emergency.)        Anesthesia Quick Evaluation  "

## 2024-08-18 NOTE — ED Notes (Signed)
 Trauma MD at bedside.

## 2024-08-18 NOTE — Anesthesia Procedure Notes (Signed)
 Arterial Line Insertion Start/End1/17/2026 10:51 PM, 08/18/2024 10:59 PM Performed by: Darlyn Rush, MD, anesthesiologist  Patient location: Pre-op. Preanesthetic checklist: patient identified, IV checked, site marked, risks and benefits discussed, surgical consent, monitors and equipment checked, pre-op evaluation, timeout performed and anesthesia consent Lidocaine  1% used for infiltration Left, radial was placed Catheter size: 20 G Hand hygiene performed  and maximum sterile barriers used   Attempts: 2 Procedure performed using ultrasound to evaluate access site. Ultrasound Notes:relevant anatomy identified, ultrasound used to visualize needle entry, vessel patent under ultrasound and image(s) printed for medical record. Following insertion, dressing applied and Biopatch. Post procedure assessment: normal and unchanged  Post procedure complications: unsuccessful attempts. Patient tolerated the procedure well with no immediate complications.

## 2024-08-18 NOTE — ED Notes (Signed)
 Pt transported to CT ?

## 2024-08-18 NOTE — Progress Notes (Signed)
 Pt transported from ED Tr B to CT and back without complications.
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
## 2024-08-19 ENCOUNTER — Inpatient Hospital Stay (HOSPITAL_COMMUNITY)

## 2024-08-19 LAB — MRSA NEXT GEN BY PCR, NASAL: MRSA by PCR Next Gen: NOT DETECTED

## 2024-08-19 LAB — BPAM RBC
Blood Product Expiration Date: 202601312359
Blood Product Expiration Date: 202601312359
Blood Product Expiration Date: 202602032359
Blood Product Expiration Date: 202602032359
Blood Product Expiration Date: 202602072359
Blood Product Expiration Date: 202602072359
Blood Product Expiration Date: 202602072359
Blood Product Expiration Date: 202602072359
Blood Product Expiration Date: 202602072359
Blood Product Expiration Date: 202602072359
Blood Product Expiration Date: 202602072359
Blood Product Expiration Date: 202602072359
Blood Product Expiration Date: 202602072359
Blood Product Expiration Date: 202602072359
Blood Product Expiration Date: 202602072359
Blood Product Expiration Date: 202602072359
ISSUE DATE / TIME: 202601172133
ISSUE DATE / TIME: 202601172203
ISSUE DATE / TIME: 202601172236
ISSUE DATE / TIME: 202601172236
ISSUE DATE / TIME: 202601172242
ISSUE DATE / TIME: 202601172242
ISSUE DATE / TIME: 202601172242
ISSUE DATE / TIME: 202601172242
ISSUE DATE / TIME: 202601172242
ISSUE DATE / TIME: 202601172242
ISSUE DATE / TIME: 202601172242
ISSUE DATE / TIME: 202601172242
ISSUE DATE / TIME: 202601172253
ISSUE DATE / TIME: 202601172253
ISSUE DATE / TIME: 202601172253
ISSUE DATE / TIME: 202601172253
Unit Type and Rh: 5100
Unit Type and Rh: 5100
Unit Type and Rh: 5100
Unit Type and Rh: 5100
Unit Type and Rh: 6200
Unit Type and Rh: 6200
Unit Type and Rh: 6200
Unit Type and Rh: 6200
Unit Type and Rh: 6200
Unit Type and Rh: 6200
Unit Type and Rh: 6200
Unit Type and Rh: 6200
Unit Type and Rh: 6200
Unit Type and Rh: 6200
Unit Type and Rh: 6200
Unit Type and Rh: 6200

## 2024-08-19 LAB — CBC
HCT: 42.1 % (ref 39.0–52.0)
HCT: 44.5 % (ref 39.0–52.0)
Hemoglobin: 14.7 g/dL (ref 13.0–17.0)
Hemoglobin: 16.5 g/dL (ref 13.0–17.0)
MCH: 28.9 pg (ref 26.0–34.0)
MCH: 30.3 pg (ref 26.0–34.0)
MCHC: 34.9 g/dL (ref 30.0–36.0)
MCHC: 37.1 g/dL — ABNORMAL HIGH (ref 30.0–36.0)
MCV: 82.7 fL (ref 80.0–100.0)
MCV: 81.7 fL (ref 80.0–100.0)
Platelets: 137 10*3/uL — ABNORMAL LOW (ref 150–400)
Platelets: 171 10*3/uL (ref 150–400)
RBC: 5.09 MIL/uL (ref 4.22–5.81)
RBC: 5.45 MIL/uL (ref 4.22–5.81)
RDW: 14.6 % (ref 11.5–15.5)
RDW: 15.2 % (ref 11.5–15.5)
WBC: 6.2 10*3/uL (ref 4.0–10.5)
WBC: 6.3 10*3/uL (ref 4.0–10.5)
nRBC: 0 % (ref 0.0–0.2)
nRBC: 0.6 % — ABNORMAL HIGH (ref 0.0–0.2)

## 2024-08-19 LAB — POCT I-STAT 7, (LYTES, BLD GAS, ICA,H+H)
Acid-base deficit: 3 mmol/L — ABNORMAL HIGH (ref 0.0–2.0)
Acid-base deficit: 3 mmol/L — ABNORMAL HIGH (ref 0.0–2.0)
Acid-base deficit: 5 mmol/L — ABNORMAL HIGH (ref 0.0–2.0)
Bicarbonate: 23 mmol/L (ref 20.0–28.0)
Bicarbonate: 21.5 mmol/L (ref 20.0–28.0)
Bicarbonate: 21.8 mmol/L (ref 20.0–28.0)
Calcium, Ion: 1.15 mmol/L (ref 1.15–1.40)
Calcium, Ion: 0.77 mmol/L — CL (ref 1.15–1.40)
Calcium, Ion: 1.01 mmol/L — ABNORMAL LOW (ref 1.15–1.40)
HCT: 42 % (ref 39.0–52.0)
HCT: 37 % — ABNORMAL LOW (ref 39.0–52.0)
HCT: 38 % — ABNORMAL LOW (ref 39.0–52.0)
Hemoglobin: 14.3 g/dL (ref 13.0–17.0)
Hemoglobin: 12.6 g/dL — ABNORMAL LOW (ref 13.0–17.0)
Hemoglobin: 12.9 g/dL — ABNORMAL LOW (ref 13.0–17.0)
O2 Saturation: 100 %
O2 Saturation: 100 %
O2 Saturation: 98 %
Patient temperature: 36.5
Potassium: 3.2 mmol/L — ABNORMAL LOW (ref 3.5–5.1)
Potassium: 4 mmol/L (ref 3.5–5.1)
Potassium: 4.9 mmol/L (ref 3.5–5.1)
Sodium: 147 mmol/L — ABNORMAL HIGH (ref 135–145)
Sodium: 142 mmol/L (ref 135–145)
Sodium: 143 mmol/L (ref 135–145)
TCO2: 24 mmol/L (ref 22–32)
TCO2: 23 mmol/L (ref 22–32)
TCO2: 23 mmol/L (ref 22–32)
pCO2 arterial: 43.4 mmHg (ref 32–48)
pCO2 arterial: 35.6 mmHg (ref 32–48)
pCO2 arterial: 46.4 mmHg (ref 32–48)
pH, Arterial: 7.331 — ABNORMAL LOW (ref 7.35–7.45)
pH, Arterial: 7.274 — ABNORMAL LOW (ref 7.35–7.45)
pH, Arterial: 7.395 (ref 7.35–7.45)
pO2, Arterial: 182 mmHg — ABNORMAL HIGH (ref 83–108)
pO2, Arterial: 102 mmHg (ref 83–108)
pO2, Arterial: 195 mmHg — ABNORMAL HIGH (ref 83–108)

## 2024-08-19 LAB — TYPE AND SCREEN
ABO/RH(D): A POS
Antibody Screen: NEGATIVE
Unit division: 0
Unit division: 0
Unit division: 0
Unit division: 0
Unit division: 0
Unit division: 0
Unit division: 0
Unit division: 0
Unit division: 0
Unit division: 0
Unit division: 0
Unit division: 0
Unit division: 0
Unit division: 0
Unit division: 0
Unit division: 0

## 2024-08-19 LAB — DIC (DISSEMINATED INTRAVASCULAR COAGULATION)PANEL
D-Dimer, Quant: >20 ug{FEU}/mL — ABNORMAL HIGH (ref 0.00–0.50)
D-Dimer, Quant: >20 ug{FEU}/mL — ABNORMAL HIGH (ref 0.00–0.50)
Fibrinogen: 201 mg/dL — ABNORMAL LOW (ref 210–475)
Fibrinogen: 287 mg/dL (ref 210–475)
INR: 1.3 — ABNORMAL HIGH (ref 0.8–1.2)
INR: 1.3 — ABNORMAL HIGH (ref 0.8–1.2)
Platelets: 140 10*3/uL — ABNORMAL LOW (ref 150–400)
Platelets: 155 10*3/uL (ref 150–400)
Prothrombin Time: 16.7 s — ABNORMAL HIGH (ref 11.4–15.2)
Prothrombin Time: 16.7 s — ABNORMAL HIGH (ref 11.4–15.2)
Smear Review: NONE SEEN
Smear Review: NONE SEEN
aPTT: 35 s (ref 24–36)
aPTT: 32 s (ref 24–36)

## 2024-08-19 LAB — URINALYSIS, ROUTINE W REFLEX MICROSCOPIC
Bacteria, UA: NONE SEEN
Bilirubin Urine: NEGATIVE
Glucose, UA: NEGATIVE mg/dL
Ketones, ur: NEGATIVE mg/dL
Leukocytes,Ua: NEGATIVE
Nitrite: NEGATIVE
Protein, ur: NEGATIVE mg/dL
Specific Gravity, Urine: 1.015 (ref 1.005–1.030)
pH: 5 (ref 5.0–8.0)

## 2024-08-19 LAB — TRIGLYCERIDES: Triglycerides: 80 mg/dL

## 2024-08-19 LAB — PREPARE CRYOPRECIPITATE: Unit division: 0

## 2024-08-19 LAB — PREPARE PLATELET PHERESIS: Unit division: 0

## 2024-08-19 LAB — BASIC METABOLIC PANEL WITH GFR
Anion gap: 10 (ref 5–15)
BUN: 12 mg/dL (ref 6–20)
CO2: 24 mmol/L (ref 22–32)
Calcium: 7.8 mg/dL — ABNORMAL LOW (ref 8.9–10.3)
Chloride: 111 mmol/L (ref 98–111)
Creatinine, Ser: 0.87 mg/dL (ref 0.61–1.24)
GFR, Estimated: >60 mL/min
Glucose, Bld: 128 mg/dL — ABNORMAL HIGH (ref 70–99)
Potassium: 3.6 mmol/L (ref 3.5–5.1)
Sodium: 144 mmol/L (ref 135–145)

## 2024-08-19 LAB — BPAM PLATELET PHERESIS
Blood Product Expiration Date: 202601182359
ISSUE DATE / TIME: 202601172240
Unit Type and Rh: 5100

## 2024-08-19 LAB — HIV ANTIBODY (ROUTINE TESTING W REFLEX): HIV Screen 4th Generation wRfx: NONREACTIVE

## 2024-08-19 LAB — APTT: aPTT: 32 s (ref 24–36)

## 2024-08-19 LAB — BPAM CRYOPRECIPITATE
Blood Product Expiration Date: 202601202359
ISSUE DATE / TIME: 202601172252
Unit Type and Rh: 5100

## 2024-08-19 LAB — PROTIME-INR
INR: 1.3 — ABNORMAL HIGH (ref 0.8–1.2)
Prothrombin Time: 16.6 s — ABNORMAL HIGH (ref 11.4–15.2)

## 2024-08-19 MED ADMIN — Sennosides Tab 8.6 MG: 8.6 mg | NDC 00904725280

## 2024-08-19 MED ADMIN — Propofol IV Emul 1000 MG/100ML (10 MG/ML): 25 ug/kg/min | INTRAVENOUS | NDC 00069024810

## 2024-08-19 MED ADMIN — Propofol IV Emul 1000 MG/100ML (10 MG/ML): 50 ug/kg/min | INTRAVENOUS | NDC 00069024801

## 2024-08-19 MED ADMIN — Propofol IV Emul 1000 MG/100ML (10 MG/ML): 30 ug/kg/min | INTRAVENOUS | NDC 00069024801

## 2024-08-19 MED ADMIN — Propofol IV Emul 1000 MG/100ML (10 MG/ML): 10 ug/kg/min | INTRAVENOUS | NDC 00069024801

## 2024-08-19 MED ADMIN — ORAL CARE MOUTH RINSE: 15 mL | OROMUCOSAL | NDC 99999080097

## 2024-08-19 MED ADMIN — Acetaminophen Tab 500 MG: 1000 mg | NDC 50580045711

## 2024-08-19 MED ADMIN — Levetiracetam Inj 500 MG/5ML (100 MG/ML): 500 mg | INTRAVENOUS | NDC 72572036001

## 2024-08-19 MED ADMIN — Chlorhexidine Gluconate Pads 2%: 6 | TOPICAL | NDC 53462070523

## 2024-08-19 MED ADMIN — Methocarbamol Inj 1000 MG/10ML: 500 mg | INTRAVENOUS | NDC 55150022310

## 2024-08-19 MED ADMIN — Fentanyl Citrate Preservative Free (PF) Inj 100 MCG/2ML: 50 ug | INTRAVENOUS | NDC 72572017001

## 2024-08-19 MED ADMIN — Fentanyl Citrate-NaCl 0.9% IV Soln 2.5 MG/250ML: 75 ug/h | INTRAVENOUS | NDC 73177010225

## 2024-08-19 MED ADMIN — Methocarbamol Tab 500 MG: 500 mg | NDC 70010075405

## 2024-08-19 MED ADMIN — Sodium Chloride IV Soln 0.9%: 1000 mL | INTRAVENOUS | NDC 00264580200

## 2024-08-19 MED ADMIN — Potassium Chloride Powder Packet 20 mEq: 40 meq | NDC 72888002407

## 2024-08-19 MED ADMIN — Sodium Chloride Irrigation Soln 0.9%: 3000 mL | NDC 99999050048

## 2024-08-19 MED FILL — Levetiracetam Inj 500 MG/5ML (100 MG/ML): 500.0000 mg | INTRAVENOUS | Qty: 5 | Status: AC

## 2024-08-19 MED FILL — Potassium Chloride Powder Packet 20 mEq: 40.0000 meq | ORAL | Qty: 2 | Status: AC

## 2024-08-19 MED FILL — Acetaminophen Tab 500 MG: 1000.0000 mg | ORAL | Qty: 2 | Status: AC

## 2024-08-19 MED FILL — Acetaminophen Tab 500 MG: 1000.0000 mg | ORAL | Qty: 2 | Status: CN

## 2024-08-19 MED FILL — Methocarbamol Inj 1000 MG/10ML: 500.0000 mg | INTRAMUSCULAR | Qty: 10 | Status: CN

## 2024-08-19 MED FILL — Methocarbamol Inj 1000 MG/10ML: 500.0000 mg | INTRAMUSCULAR | Qty: 10 | Status: AC

## 2024-08-19 MED FILL — Methocarbamol Tab 500 MG: 500.0000 mg | ORAL | Qty: 1 | Status: AC
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
## 2024-08-19 NOTE — Transfer of Care (Addendum)
 Immediate Anesthesia Transfer of Care Note  Patient: Justin Chaney  Procedure(s) Performed: LAPAROTOMY, EXPLORATORY (Abdomen) COLECTOMY, RIGHT (Abdomen)  Patient Location: ICU  Anesthesia Type:General  Level of Consciousness: sedated, unresponsive, and Patient remains intubated per anesthesia plan  Airway & Oxygen Therapy: Patient remains intubated per anesthesia plan and Patient placed on Ventilator (see vital sign flow sheet for setting)  Post-op Assessment: Report given to RN and Post -op Vital signs reviewed and stable  Post vital signs: Reviewed and stable  Last Vitals:  Vitals Value Taken Time  BP 139/59 08/19/24 01:14  Temp    Pulse 94 08/19/24 01:28  Resp 26 08/19/24 01:28  SpO2 100 % 08/19/24 01:28  Vitals shown include unfiled device data.  Last Pain:  Vitals:   08/18/24 2150  TempSrc:   PainSc: 10-Worst pain ever         Complications: No notable events documented.

## 2024-08-19 NOTE — Progress Notes (Signed)
 Patient was transported on the vent from 4N-22 to CT and back to 4N-22. RT x2 and RN x2 were present. VSS and no complications were noted.

## 2024-08-19 NOTE — Progress Notes (Signed)
 Orthopedic Tech Progress Note Patient Details:  Kylee Nardozzi 1996/04/22 [id number]  15lbs of skeletal traction placed to the RLE once the pin and bow was placed by Dr. Germaine. The rope had to go over the main traction bar as it would have been pulled between the toes and against the foot.   Musculoskeletal Traction Type of Traction: Skeletal (Balanced Suspension) Traction Location: RLE Traction Weight: 15 lbs   Post Interventions Patient Tolerated: Well Instructions Provided: Care of device  Lonn Im Ronal Brasil 08/19/2024, 2:25 PM
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
## 2024-08-19 NOTE — Procedures (Signed)
 Insertion of right tibial skeletal traction pin  Bony landmarks of the knee and proximal tibia were identified and a starting point was noted just distal to the tibial tubercle and posterior to the tibial crest on the lateral aspect of the leg.  The overlying skin was prepped using chlorhexidine .  10 cc of lidocaine  were infiltrated on both the medial and lateral aspect of the tibia.  A K wire was then used under power to drill bicortically through the tibia.  AP and lateral portable x-ray confirmed placement of the pin.  We then placed sterile dressings around the medial and lateral pin sites and placed a traction bow under tension with 15 pounds of traction weight.  Following the procedure, the vascular exam was unchanged with easily palpable DP and PT pulses.

## 2024-08-19 NOTE — Plan of Care (Signed)
  Problem: Clinical Measurements: Goal: Will remain free from infection Outcome: Progressing Goal: Cardiovascular complication will be avoided Outcome: Progressing   Problem: Skin Integrity: Goal: Risk for impaired skin integrity will decrease Outcome: Progressing
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
## 2024-08-19 NOTE — Progress Notes (Signed)
 Discussed neurosurgical consultation with Dr. [name] at [phone]

## 2024-08-19 NOTE — Progress Notes (Signed)
 Transition of Care [facility]) - CAGE-AID Screening   Patient Details  Name: Justin Chaney MRN: [id number] Date of Birth: 06/13/1996   [name], RN Trauma Response Nurse Phone Number: [phone] 08/19/2024, 2:42 PM   Clinical Narrative:  Pt currently is intubated, unable to participate  CAGE-AID Screening: Substance Abuse Screening unable to be completed due to: : Patient unable to participate (ICU/Intubated)
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
## 2024-08-19 NOTE — Progress Notes (Signed)
 Discussed femur fracture consultation with Dr. Germaine at 240 AM

## 2024-08-19 NOTE — Progress Notes (Signed)
 Pt transported to and from CT on vent w/o complication.
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
## 2024-08-20 ENCOUNTER — Other Ambulatory Visit (HOSPITAL_COMMUNITY): Payer: Self-pay

## 2024-08-20 ENCOUNTER — Encounter (HOSPITAL_COMMUNITY): Payer: Self-pay | Admitting: Surgery

## 2024-08-20 ENCOUNTER — Inpatient Hospital Stay (HOSPITAL_COMMUNITY): Payer: Self-pay | Admitting: Certified Registered"

## 2024-08-20 ENCOUNTER — Telehealth (HOSPITAL_COMMUNITY): Payer: Self-pay

## 2024-08-20 ENCOUNTER — Other Ambulatory Visit: Payer: Self-pay

## 2024-08-20 ENCOUNTER — Inpatient Hospital Stay (HOSPITAL_COMMUNITY)

## 2024-08-20 DIAGNOSIS — S72301A Unspecified fracture of shaft of right femur, initial encounter for closed fracture: Secondary | ICD-10-CM | POA: Diagnosis not present

## 2024-08-20 DIAGNOSIS — E669 Obesity, unspecified: Secondary | ICD-10-CM | POA: Diagnosis not present

## 2024-08-20 DIAGNOSIS — Z6836 Body mass index (BMI) 36.0-36.9, adult: Secondary | ICD-10-CM

## 2024-08-20 DIAGNOSIS — F418 Other specified anxiety disorders: Secondary | ICD-10-CM | POA: Diagnosis not present

## 2024-08-20 LAB — VITAMIN D 25 HYDROXY (VIT D DEFICIENCY, FRACTURES): Vit D, 25-Hydroxy: <6 ng/mL — ABNORMAL LOW (ref 30–100)

## 2024-08-20 LAB — BPAM FFP
Blood Product Expiration Date: 202601202359
Blood Product Expiration Date: 202601202359
Blood Product Expiration Date: 202601212359
Blood Product Expiration Date: 202601212359
Blood Product Expiration Date: 202601212359
Blood Product Expiration Date: 202601212359
Blood Product Expiration Date: 202601212359
Blood Product Expiration Date: 202601212359
Blood Product Expiration Date: 202601212359
Blood Product Expiration Date: 202601212359
Blood Product Expiration Date: 202601212359
Blood Product Expiration Date: 202602052359
Blood Product Expiration Date: 202602052359
ISSUE DATE / TIME: 202601172237
ISSUE DATE / TIME: 202601172243
ISSUE DATE / TIME: 202601172243
ISSUE DATE / TIME: 202601172243
ISSUE DATE / TIME: 202601172243
ISSUE DATE / TIME: 202601172243
ISSUE DATE / TIME: 202601172243
ISSUE DATE / TIME: 202601172243
ISSUE DATE / TIME: 202601172243
ISSUE DATE / TIME: 202601172250
ISSUE DATE / TIME: 202601172250
ISSUE DATE / TIME: 202601172250
ISSUE DATE / TIME: 202601172250
Unit Type and Rh: 6200
Unit Type and Rh: 6200
Unit Type and Rh: 6200
Unit Type and Rh: 6200
Unit Type and Rh: 6200
Unit Type and Rh: 6200
Unit Type and Rh: 6200
Unit Type and Rh: 6200
Unit Type and Rh: 6200
Unit Type and Rh: 6200
Unit Type and Rh: 6200
Unit Type and Rh: 6200
Unit Type and Rh: 6200

## 2024-08-20 LAB — PREPARE FRESH FROZEN PLASMA
Unit division: 0
Unit division: 0
Unit division: 0
Unit division: 0
Unit division: 0

## 2024-08-20 LAB — BASIC METABOLIC PANEL WITH GFR
Anion gap: 8 (ref 5–15)
Anion gap: 7 (ref 5–15)
BUN: 14 mg/dL (ref 6–20)
BUN: 14 mg/dL (ref 6–20)
CO2: 25 mmol/L (ref 22–32)
CO2: 25 mmol/L (ref 22–32)
Calcium: 8 mg/dL — ABNORMAL LOW (ref 8.9–10.3)
Calcium: 8.2 mg/dL — ABNORMAL LOW (ref 8.9–10.3)
Chloride: 121 mmol/L — ABNORMAL HIGH (ref 98–111)
Chloride: 121 mmol/L — ABNORMAL HIGH (ref 98–111)
Creatinine, Ser: 1.26 mg/dL — ABNORMAL HIGH (ref 0.61–1.24)
Creatinine, Ser: 1.04 mg/dL (ref 0.61–1.24)
GFR, Estimated: >60 mL/min
GFR, Estimated: >60 mL/min
Glucose, Bld: 131 mg/dL — ABNORMAL HIGH (ref 70–99)
Glucose, Bld: 147 mg/dL — ABNORMAL HIGH (ref 70–99)
Potassium: 4.3 mmol/L (ref 3.5–5.1)
Potassium: 4.4 mmol/L (ref 3.5–5.1)
Sodium: 154 mmol/L — ABNORMAL HIGH (ref 135–145)
Sodium: 153 mmol/L — ABNORMAL HIGH (ref 135–145)

## 2024-08-20 LAB — POCT I-STAT 7, (LYTES, BLD GAS, ICA,H+H)
Acid-base deficit: 2 mmol/L (ref 0.0–2.0)
Bicarbonate: 25.6 mmol/L (ref 20.0–28.0)
Calcium, Ion: 1.21 mmol/L (ref 1.15–1.40)
HCT: 39 % (ref 39.0–52.0)
Hemoglobin: 13.3 g/dL (ref 13.0–17.0)
O2 Saturation: 94 %
Potassium: 4.3 mmol/L (ref 3.5–5.1)
Sodium: 155 mmol/L — ABNORMAL HIGH (ref 135–145)
TCO2: 27 mmol/L (ref 22–32)
pCO2 arterial: 52.8 mmHg — ABNORMAL HIGH (ref 32–48)
pH, Arterial: 7.294 — ABNORMAL LOW (ref 7.35–7.45)
pO2, Arterial: 80 mmHg — ABNORMAL LOW (ref 83–108)

## 2024-08-20 LAB — CBC
HCT: 42.9 % (ref 39.0–52.0)
Hemoglobin: 14.2 g/dL (ref 13.0–17.0)
MCH: 28.7 pg (ref 26.0–34.0)
MCHC: 33.1 g/dL (ref 30.0–36.0)
MCV: 86.7 fL (ref 80.0–100.0)
Platelets: 120 10*3/uL — ABNORMAL LOW (ref 150–400)
RBC: 4.95 MIL/uL (ref 4.22–5.81)
RDW: 16.1 % — ABNORMAL HIGH (ref 11.5–15.5)
WBC: 12.2 10*3/uL — ABNORMAL HIGH (ref 4.0–10.5)
nRBC: 0 % (ref 0.0–0.2)

## 2024-08-20 MED ORDER — DEXMEDETOMIDINE HCL IN NACL 400 MCG/100ML IV SOLN
0.0000 ug/kg/h | INTRAVENOUS | Status: DC
  Administered 2024-08-20: 0.4 ug/kg/h via INTRAVENOUS
  Administered 2024-08-20: 0.6 ug/kg/h via INTRAVENOUS
  Administered 2024-08-21: 0.4 ug/kg/h via INTRAVENOUS
  Administered 2024-08-21: 0.7 ug/kg/h via INTRAVENOUS
  Filled 2024-08-20 (×5): qty 100

## 2024-08-20 MED ADMIN — Sennosides Tab 8.6 MG: 8.6 mg | NDC 00904725280

## 2024-08-20 MED ADMIN — Propofol IV Emul 1000 MG/100ML (10 MG/ML): 40 ug/kg/min | INTRAVENOUS | NDC 00069024801

## 2024-08-20 MED ADMIN — Propofol IV Emul 1000 MG/100ML (10 MG/ML): 30 ug/kg/min | INTRAVENOUS | NDC 00069024801

## 2024-08-20 MED ADMIN — Fentanyl Citrate Preservative Free (PF) Inj 100 MCG/2ML: 100 ug | INTRAVENOUS

## 2024-08-20 MED ADMIN — ORAL CARE MOUTH RINSE: 15 mL | OROMUCOSAL | NDC 99999080097

## 2024-08-20 MED ADMIN — Docusate Sodium Liquid 150 MG/15ML: 100 mg | NDC 00904727966

## 2024-08-20 MED ADMIN — Acetaminophen Tab 500 MG: 1000 mg | NDC 50580045711

## 2024-08-20 MED ADMIN — Levetiracetam Inj 500 MG/5ML (100 MG/ML): 500 mg | INTRAVENOUS | NDC 72572036001

## 2024-08-20 MED ADMIN — Methocarbamol Inj 1000 MG/10ML: 500 mg | INTRAVENOUS | NDC 55150022310

## 2024-08-20 MED ADMIN — Cefazolin Sodium-Dextrose IV Solution 2 GM/100ML-4%: 2 g | INTRAVENOUS | NDC 00338350841

## 2024-08-20 MED ADMIN — Sodium Chloride Irrigation Soln 0.9%: 1000 mL | NDC 99999050048

## 2024-08-20 MED ADMIN — henylephrine-NaCl Pref Syr 0.8 MG/10ML-0.9% (80 MCG/ML): 160 ug | INTRAVENOUS | NDC 65302050510

## 2024-08-20 MED ADMIN — Fentanyl Citrate-NaCl 0.9% IV Soln 2.5 MG/250ML: 100 ug/h | INTRAVENOUS | NDC 73177010225

## 2024-08-20 MED ADMIN — Cefazolin Sodium-Dextrose IV Solution 1 GM/50ML-4%: 2 g | INTRAVENOUS | NDC 00338350341

## 2024-08-20 MED ADMIN — Methocarbamol Tab 500 MG: 500 mg | NDC 70010075405

## 2024-08-20 MED ADMIN — Rocuronium Bromide IV Soln Pref Syr 100 MG/10ML (10 MG/ML): 50 mg | INTRAVENOUS | NDC 99999070048

## 2024-08-20 MED ADMIN — Rocuronium Bromide IV Soln Pref Syr 100 MG/10ML (10 MG/ML): 30 mg | INTRAVENOUS | NDC 99999070048

## 2024-08-20 MED ADMIN — Polyethylene Glycol 3350 Oral Packet 17 GM: 17 g | NDC 00904693186

## 2024-08-20 MED ADMIN — Norepinephrine-Dextrose IV Solution 4 MG/250ML-5%: 7 ug/min | INTRAVENOUS | NDC 00338011220

## 2024-08-20 MED ADMIN — Norepinephrine-Dextrose IV Solution 4 MG/250ML-5%: 2 ug/min | INTRAVENOUS | NDC 00338011220

## 2024-08-20 MED ADMIN — Ondansetron HCl Inj 4 MG/2ML (2 MG/ML): 4 mg | INTRAVENOUS | NDC 60505613005

## 2024-08-20 MED ADMIN — Tranexamic Acid-Sodium Chloride IV Soln 1000 MG/100ML-0.7%: 1000 mg | INTRAVENOUS | NDC 51754010801

## 2024-08-20 MED ADMIN — Sodium Chloride IV Soln 0.9%: 1000 mL | INTRAVENOUS | NDC 00264580200

## 2024-08-20 MED ADMIN — Fentanyl Citrate Preservative Free (PF) Inj 250 MCG/5ML: 100 ug | INTRAVENOUS | NDC 72572017125

## 2024-08-20 MED ADMIN — Midazolam HCl Inj PF 2 MG/2ML (Base Equivalent): 2 mg | INTRAVENOUS | NDC 00409000125

## 2024-08-20 MED FILL — Docusate Sodium Liquid 150 MG/15ML: 100.0000 mg | ORAL | Qty: 10 | Status: AC

## 2024-08-20 MED FILL — Cefazolin Sodium-Dextrose IV Solution 2 GM/100ML-4%: 2.0000 g | INTRAVENOUS | Qty: 100 | Status: AC

## 2024-08-20 MED FILL — Norepinephrine-Dextrose IV Solution 4 MG/250ML-5%: 0.0000 ug/min | INTRAVENOUS | Qty: 250 | Status: AC

## 2024-08-20 MED FILL — Propofol IV Emul 200 MG/20ML (10 MG/ML): INTRAVENOUS | Qty: 20 | Status: AC

## 2024-08-20 MED FILL — Fentanyl Citrate Preservative Free (PF) Inj 250 MCG/5ML: INTRAMUSCULAR | Qty: 5 | Status: AC

## 2024-08-20 MED FILL — Haloperidol Lactate Inj 5 MG/ML: 5.0000 mg | INTRAMUSCULAR | Qty: 1 | Status: AC

## 2024-08-20 MED FILL — Ondansetron HCl Inj 4 MG/2ML (2 MG/ML): INTRAMUSCULAR | Qty: 2 | Status: AC

## 2024-08-20 MED FILL — Midazolam HCl Inj 2 MG/2ML (Base Equivalent): INTRAMUSCULAR | Qty: 2 | Status: AC

## 2024-08-20 MED FILL — Tranexamic Acid-Sodium Chloride IV Soln 1000 MG/100ML-0.7%: 1000.0000 mg | INTRAVENOUS | Qty: 100 | Status: AC

## 2024-08-20 NOTE — Progress Notes (Signed)
 Peripherally Inserted Central Catheter Placement  The IV Nurse has discussed with the patient and/or persons authorized to consent for the patient, the purpose of this procedure and the potential benefits and risks involved with this procedure.  The benefits include less needle sticks, lab draws from the catheter, and the patient may be discharged home with the catheter. Risks include, but not limited to, infection, bleeding, blood clot (thrombus formation), and puncture of an artery; nerve damage and irregular heartbeat and possibility to perform a PICC exchange if needed/ordered by physician.  Alternatives to this procedure were also discussed.  Bard Power PICC patient education guide, fact sheet on infection prevention and patient information card has been provided to patient /or left at bedside.    PICC Placement Documentation  PICC Triple Lumen 08/20/24 Right Brachial 42 cm 1 cm (Active)  Indication for Insertion or Continuance of Line Vasoactive infusions 08/20/24 1625  Exposed Catheter (cm) 1 cm 08/20/24 1625  Site Assessment Clean, Dry, Intact 08/20/24 1625  Lumen #1 Status Flushed;Saline locked;Blood return noted 08/20/24 1625  Lumen #2 Status Flushed;Saline locked;Blood return noted 08/20/24 1625  Lumen #3 Status Flushed;Saline locked;Blood return noted 08/20/24 1625  Dressing Type Transparent;Securing device 08/20/24 1625  Dressing Status Antimicrobial disc/dressing in place;Clean, Dry, Intact 08/20/24 1625  Line Care Connections checked and tightened 08/20/24 1625  Line Adjustment (NICU/IV Team Only) Yes 08/20/24 1625  Dressing Intervention New dressing;Adhesive placed at insertion site (IV team only) 08/20/24 1625  Dressing Change Due 08/27/24 08/20/24 1625    Patient's legal guardian. [name] Theodoro, signed PICC consent via telephone. Verified by 2 PICC RNs.   Jolee Na 08/20/2024, 4:27 PM
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
## 2024-08-20 NOTE — Telephone Encounter (Signed)
 Pharmacy Patient Advocate Encounter  Insurance verification completed.    The patient is insured through [employer] [place].     Ran test claim for haloperidol  decanoate 100 MG/ML injection and the current 30 day co-pay is $4.   This test claim was processed through [facility]- copay amounts may vary at other pharmacies due to [employer], or as the patient moves through the different stages of their insurance plan.
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
## 2024-08-20 NOTE — Plan of Care (Signed)
  Problem: Clinical Measurements: Goal: Diagnostic test results will improve Outcome: Progressing Goal: Cardiovascular complication will be avoided Outcome: Progressing   Problem: Coping: Goal: Level of anxiety will decrease Outcome: Progressing   Problem: Pain Managment: Goal: General experience of comfort will improve and/or be controlled Outcome: Progressing

## 2024-08-20 NOTE — Progress Notes (Signed)
 Upon assessment of patient, foley catheter tubing noted to be pink tinged. See media. TRN Autumn notified. No new orders at this time. Patient with adequate output.   Care ongoing.   Berneda Essex, RN

## 2024-08-20 NOTE — Anesthesia Postprocedure Evaluation (Addendum)
"   Anesthesia Post Note  Patient: Justin Chaney  Procedure(s) Performed: LAPAROTOMY, EXPLORATORY (Abdomen) COLECTOMY, RIGHT (Abdomen)     Patient location during evaluation: ICU Anesthesia Type: General Level of consciousness: sedated and patient remains intubated per anesthesia plan Pain management: pain level controlled Vital Signs Assessment: post-procedure vital signs reviewed and stable Respiratory status: patient remains intubated per anesthesia plan Cardiovascular status: stable Anesthetic complications: no   No notable events documented.  Last Vitals:  Vitals:   08/20/24 2100 08/20/24 2200  BP:    Pulse: 86 88  Resp: 20 20  Temp:    SpO2: 93% 94%    Last Pain:  Vitals:   08/20/24 2000  TempSrc: Axillary  PainSc:                  Norleen Pope      "

## 2024-08-20 NOTE — Anesthesia Postprocedure Evaluation (Signed)
"   Anesthesia Post Note  Patient: Justin Chaney  Procedure(s) Performed: RETROGRADE INTRAMEDULLARY NAILING OF RIGHT FEMUR (Right: Leg Upper)     Patient location during evaluation: ICU Anesthesia Type: General Level of consciousness: sedated and patient remains intubated per anesthesia plan Pain management: pain level controlled Vital Signs Assessment: post-procedure vital signs reviewed and stable Respiratory status: patient on ventilator - see flowsheet for VS and patient remains intubated per anesthesia plan Cardiovascular status: stable Postop Assessment: no apparent nausea or vomiting Anesthetic complications: no Comments: Pt not on pressors, remains critically ill    No notable events documented.  Last Vitals:  Vitals:   08/20/24 1808 08/20/24 1830  BP:    Pulse: 82 85  Resp: 20 (!) 0  Temp:    SpO2: 96% 95%    Last Pain:  Vitals:   08/20/24 1200  TempSrc: Axillary  PainSc:                  Sylvain Hasten,E. Jillyn Stacey      "
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
## 2024-08-20 NOTE — Transfer of Care (Signed)
 Immediate Anesthesia Transfer of Care Note  Patient: Justin Chaney  Procedure(s) Performed: RETROGRADE INTRAMEDULLARY NAILING OF RIGHT FEMUR (Right: Leg Upper)  Patient Location: ICU  Anesthesia Type:General  Level of Consciousness: sedated, unresponsive, and Patient remains intubated per anesthesia plan  Airway & Oxygen Therapy: Patient remains intubated per anesthesia plan and Patient placed on Ventilator (see vital sign flow sheet for setting)  Post-op Assessment: Report given to RN and Post -op Vital signs reviewed and stable  Post vital signs: Reviewed and stable  Last Vitals:  Vitals Value Taken Time  BP    Temp    Pulse 115 08/20/24 09:18  Resp 21 08/20/24 09:18  SpO2 95 % 08/20/24 09:18  Vitals shown include unfiled device data.  Last Pain:  Vitals:   08/20/24 0400  TempSrc: Axillary  PainSc:          Complications: No notable events documented.

## 2024-08-20 NOTE — Interval H&P Note (Signed)
 History and Physical Interval Note:  08/20/2024 7:35 AM  Justin Chaney  has presented today for surgery, with the diagnosis of Right femur fracture.  The various methods of treatment have been discussed with the patient and family. After consideration of risks, benefits and other options for treatment, the patient has consented to  Procedures: INSERTION, INTRAMEDULLARY ROD, FEMUR, RETROGRADE (Right) as a surgical intervention.  The patient's history has been reviewed, patient examined, no change in status, stable for surgery.  I have reviewed the patient's chart and labs.  Questions were answered to the patient's satisfaction.     Lougenia Morrissey P Faten Frieson

## 2024-08-20 NOTE — Progress Notes (Signed)
 Transported from 4N22 to CT and then back to 4N without complications

## 2024-08-21 ENCOUNTER — Inpatient Hospital Stay (HOSPITAL_COMMUNITY)

## 2024-08-21 DIAGNOSIS — J9601 Acute respiratory failure with hypoxia: Secondary | ICD-10-CM | POA: Diagnosis not present

## 2024-08-21 DIAGNOSIS — E870 Hyperosmolality and hypernatremia: Secondary | ICD-10-CM | POA: Diagnosis not present

## 2024-08-21 DIAGNOSIS — I361 Nonrheumatic tricuspid (valve) insufficiency: Secondary | ICD-10-CM

## 2024-08-21 DIAGNOSIS — A4101 Sepsis due to Methicillin susceptible Staphylococcus aureus: Secondary | ICD-10-CM | POA: Diagnosis not present

## 2024-08-21 DIAGNOSIS — S065XAA Traumatic subdural hemorrhage with loss of consciousness status unknown, initial encounter: Secondary | ICD-10-CM | POA: Diagnosis not present

## 2024-08-21 DIAGNOSIS — S066XAA Traumatic subarachnoid hemorrhage with loss of consciousness status unknown, initial encounter: Secondary | ICD-10-CM | POA: Diagnosis not present

## 2024-08-21 DIAGNOSIS — J15211 Pneumonia due to Methicillin susceptible Staphylococcus aureus: Secondary | ICD-10-CM | POA: Diagnosis not present

## 2024-08-21 DIAGNOSIS — E559 Vitamin D deficiency, unspecified: Secondary | ICD-10-CM | POA: Diagnosis not present

## 2024-08-21 DIAGNOSIS — E669 Obesity, unspecified: Secondary | ICD-10-CM | POA: Diagnosis not present

## 2024-08-21 DIAGNOSIS — J9602 Acute respiratory failure with hypercapnia: Secondary | ICD-10-CM | POA: Diagnosis not present

## 2024-08-21 LAB — POCT I-STAT 7, (LYTES, BLD GAS, ICA,H+H)
Acid-base deficit: 1 mmol/L (ref 0.0–2.0)
Acid-base deficit: 1 mmol/L (ref 0.0–2.0)
Acid-base deficit: 1 mmol/L (ref 0.0–2.0)
Bicarbonate: 24.1 mmol/L (ref 20.0–28.0)
Bicarbonate: 25 mmol/L (ref 20.0–28.0)
Bicarbonate: 25.2 mmol/L (ref 20.0–28.0)
Calcium, Ion: 1.17 mmol/L (ref 1.15–1.40)
Calcium, Ion: 1.19 mmol/L (ref 1.15–1.40)
Calcium, Ion: 1.19 mmol/L (ref 1.15–1.40)
HCT: 32 % — ABNORMAL LOW (ref 39.0–52.0)
HCT: 31 % — ABNORMAL LOW (ref 39.0–52.0)
HCT: 33 % — ABNORMAL LOW (ref 39.0–52.0)
Hemoglobin: 10.9 g/dL — ABNORMAL LOW (ref 13.0–17.0)
Hemoglobin: 10.5 g/dL — ABNORMAL LOW (ref 13.0–17.0)
Hemoglobin: 11.2 g/dL — ABNORMAL LOW (ref 13.0–17.0)
O2 Saturation: 74 %
O2 Saturation: 95 %
O2 Saturation: 100 %
Patient temperature: 99.2
Patient temperature: 100
Patient temperature: 101
Potassium: 3.9 mmol/L (ref 3.5–5.1)
Potassium: 4.1 mmol/L (ref 3.5–5.1)
Potassium: 4.5 mmol/L (ref 3.5–5.1)
Sodium: 153 mmol/L — ABNORMAL HIGH (ref 135–145)
Sodium: 153 mmol/L — ABNORMAL HIGH (ref 135–145)
Sodium: 153 mmol/L — ABNORMAL HIGH (ref 135–145)
TCO2: 25 mmol/L (ref 22–32)
TCO2: 26 mmol/L (ref 22–32)
TCO2: 27 mmol/L (ref 22–32)
pCO2 arterial: 43 mmHg (ref 32–48)
pCO2 arterial: 48.5 mmHg — ABNORMAL HIGH (ref 32–48)
pCO2 arterial: 52.8 mmHg — ABNORMAL HIGH (ref 32–48)
pH, Arterial: 7.358 (ref 7.35–7.45)
pH, Arterial: 7.324 — ABNORMAL LOW (ref 7.35–7.45)
pH, Arterial: 7.293 — ABNORMAL LOW (ref 7.35–7.45)
pO2, Arterial: 42 mmHg — ABNORMAL LOW (ref 83–108)
pO2, Arterial: 83 mmHg (ref 83–108)
pO2, Arterial: 231 mmHg — ABNORMAL HIGH (ref 83–108)

## 2024-08-21 LAB — ECHOCARDIOGRAM COMPLETE
AR max vel: 2.75 cm2
AV Area VTI: 2.65 cm2
AV Area mean vel: 2.64 cm2
AV Mean grad: 4 mmHg
AV Peak grad: 6.7 mmHg
Ao pk vel: 1.29 m/s
Area-P 1/2: 5.2 cm2
Calc EF: 63.7 %
Height: 71 in
MV VTI: 3.07 cm2
S' Lateral: 3 cm
Single Plane A2C EF: 62.9 %
Single Plane A4C EF: 63.3 %
Weight: 4232.83 [oz_av]

## 2024-08-21 LAB — RESPIRATORY PANEL BY PCR

## 2024-08-21 LAB — CBC
HCT: 36.1 % — ABNORMAL LOW (ref 39.0–52.0)
Hemoglobin: 11.8 g/dL — ABNORMAL LOW (ref 13.0–17.0)
MCH: 28.8 pg (ref 26.0–34.0)
MCHC: 32.7 g/dL (ref 30.0–36.0)
MCV: 88 fL (ref 80.0–100.0)
Platelets: 114 10*3/uL — ABNORMAL LOW (ref 150–400)
RBC: 4.1 MIL/uL — ABNORMAL LOW (ref 4.22–5.81)
RDW: 15.5 % (ref 11.5–15.5)
WBC: 9.6 10*3/uL (ref 4.0–10.5)
nRBC: 0 % (ref 0.0–0.2)

## 2024-08-21 LAB — BASIC METABOLIC PANEL WITH GFR
Anion gap: 8 (ref 5–15)
BUN: 17 mg/dL (ref 6–20)
CO2: 23 mmol/L (ref 22–32)
Calcium: 8 mg/dL — ABNORMAL LOW (ref 8.9–10.3)
Chloride: 120 mmol/L — ABNORMAL HIGH (ref 98–111)
Creatinine, Ser: 0.98 mg/dL (ref 0.61–1.24)
GFR, Estimated: >60 mL/min
Glucose, Bld: 140 mg/dL — ABNORMAL HIGH (ref 70–99)
Potassium: 4.3 mmol/L (ref 3.5–5.1)
Sodium: 151 mmol/L — ABNORMAL HIGH (ref 135–145)

## 2024-08-21 LAB — SURGICAL PATHOLOGY

## 2024-08-21 MED ADMIN — Sennosides Tab 8.6 MG: 8.6 mg | NDC 00904725280

## 2024-08-21 MED ADMIN — Propofol IV Emul 1000 MG/100ML (10 MG/ML): 20 ug/kg/min | INTRAVENOUS | NDC 00069024801

## 2024-08-21 MED ADMIN — ORAL CARE MOUTH RINSE: 15 mL | OROMUCOSAL | NDC 99999080097

## 2024-08-21 MED ADMIN — Fentanyl Citrate Preservative Free (PF) Inj 100 MCG/2ML: 100 ug/h | INTRAVENOUS | NDC 72572017201

## 2024-08-21 MED ADMIN — Docusate Sodium Liquid 150 MG/15ML: 100 mg | NDC 00904727966

## 2024-08-21 MED ADMIN — Acetaminophen Tab 500 MG: 1000 mg | NDC 50580045711

## 2024-08-21 MED ADMIN — Levetiracetam Inj 500 MG/5ML (100 MG/ML): 500 mg | INTRAVENOUS | NDC 72572036001

## 2024-08-21 MED ADMIN — Levetiracetam Inj 500 MG/5ML (100 MG/ML): 500 mg | INTRAVENOUS | NDC 00409188622

## 2024-08-21 MED ADMIN — CEFEPIME 2 GM IVPB: 2 g | INTRAVENOUS | NDC 60505614700

## 2024-08-21 MED ADMIN — Enoxaparin Sodium Inj Soln Pref Syr 40 MG/0.4ML: 40 mg | SUBCUTANEOUS | NDC 00781324602

## 2024-08-21 MED ADMIN — Chlorhexidine Gluconate Pads 2%: 6 | TOPICAL | NDC 53462070523

## 2024-08-21 MED ADMIN — Methocarbamol Inj 1000 MG/10ML: 500 mg | INTRAVENOUS | NDC 55150022310

## 2024-08-21 MED ADMIN — Cefazolin Sodium-Dextrose IV Solution 2 GM/100ML-4%: 2 g | INTRAVENOUS | NDC 00338350841

## 2024-08-21 MED ADMIN — Cholecalciferol Tab 25 MCG (1000 Unit): 1000 [IU] | NDC 2055503300

## 2024-08-21 MED ADMIN — Quetiapine Fumarate Tab 50 MG: 50 mg | NDC 67877024901

## 2024-08-21 MED ADMIN — Dexamethasone Sod Phosphate Preservative Free Inj 10 MG/ML: 20 mg | INTRAVENOUS | NDC 25021005301

## 2024-08-21 MED ADMIN — Pantoprazole Sodium For IV Soln 40 MG (Base Equiv): 40 mg | INTRAVENOUS | NDC 00008092351

## 2024-08-21 MED ADMIN — Perflutren Lipid Microsphere IV Susp 1.1 MG/ML: 4 mL | INTRAVENOUS | NDC 99999100031

## 2024-08-21 MED ADMIN — Polyethylene Glycol 3350 Oral Packet 17 GM: 17 g | NDC 00904693186

## 2024-08-21 MED ADMIN — Norepinephrine-Dextrose IV Solution 4 MG/250ML-5%: 2 ug/min | INTRAVENOUS | NDC 00338011220

## 2024-08-21 MED ADMIN — Ipratropium-Albuterol Nebu Soln 0.5-2.5(3) MG/3ML: 3 mL | NDC 00378967131

## 2024-08-21 MED ADMIN — Vecuronium Bromide For Inj 10 MG: 10 mg | INTRAVENOUS | NDC 67457043800

## 2024-08-21 MED ADMIN — Vancomycin HCl IV Soln 2000 MG/400ML (Base Equivalent): 2000 mg | INTRAVENOUS | NDC 70594004401

## 2024-08-21 MED ADMIN — Vancomycin HCl IV Soln 1250 MG/250ML (Base Equivalent): 1250 mg | INTRAVENOUS | NDC 70594005701

## 2024-08-21 MED FILL — Dexamethasone Sod Phosphate Preservative Free Inj 10 MG/ML: 20.0000 mg | INTRAMUSCULAR | Qty: 2 | Status: AC

## 2024-08-21 MED FILL — Pantoprazole Sodium For IV Soln 40 MG (Base Equiv): 40.0000 mg | INTRAVENOUS | Qty: 10 | Status: AC

## 2024-08-21 MED FILL — Cefepime HCl For IV Soln 2 GM: 2.0000 g | INTRAVENOUS | Qty: 12.5 | Status: AC

## 2024-08-21 MED FILL — Quetiapine Fumarate Tab 50 MG: 50.0000 mg | ORAL | Qty: 1 | Status: AC

## 2024-08-21 MED FILL — Enoxaparin Sodium Inj Soln Pref Syr 40 MG/0.4ML: 40.0000 mg | INTRAMUSCULAR | Qty: 0.4 | Status: AC

## 2024-08-21 MED FILL — Cholecalciferol Tab 25 MCG (1000 Unit): 1000.0000 [IU] | ORAL | Qty: 1 | Status: AC

## 2024-08-21 MED FILL — Fentanyl Citrate Preservative Free (PF) Inj 2500 MCG/50ML: 0.0000 ug/h | INTRAMUSCULAR | Qty: 100 | Status: AC

## 2024-08-21 MED FILL — Vancomycin HCl IV Soln 2000 MG/400ML (Base Equivalent): 2000.0000 mg | INTRAVENOUS | Qty: 400 | Status: AC

## 2024-08-21 MED FILL — Calcium Chloride Inj 10%: INTRAVENOUS | Qty: 10 | Status: AC

## 2024-08-21 MED FILL — Vancomycin HCl IV Soln 1250 MG/250ML (Base Equivalent): 1250.0000 mg | INTRAVENOUS | Qty: 250 | Status: AC

## 2024-08-21 MED FILL — Vecuronium Bromide For Inj 10 MG: INTRAVENOUS | Qty: 10 | Status: AC

## 2024-08-21 MED FILL — Dexamethasone Sod Phosphate Preservative Free Inj 10 MG/ML: 10.0000 mg | INTRAMUSCULAR | Qty: 1 | Status: AC

## 2024-08-21 MED FILL — Ipratropium-Albuterol Nebu Soln 0.5-2.5(3) MG/3ML: RESPIRATORY_TRACT | Qty: 3 | Status: AC

## 2024-08-21 NOTE — Progress Notes (Signed)
 CT head reviewed - stable  Recommendations: SBP<160 No further imaging from my standpoint Ok for DVT ppx now If necessary, ok for therapeutic anticoagulation 1 week from injury 1/24 Diet and activity as tolerated Neurosurgery team to sign off. Thank you for allowing us  to participate in the care of this patient. Please do not hesitate to call us  with questions or concerns
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
## 2024-08-21 NOTE — Progress Notes (Signed)
 Spoke with patient's mother and updated her on plan of care.  Received most recent medication and dosing.  *Invega 234mg  monthly injection (last received at least 2 months ago).  *Divalproex sodium 500mg  1 tab BID - however pt has not been taking this for quite some time.   Although pt is a ward of the state and has a legal guardian, pt's mother, Minus, is also very involved with patient and is very reasonable.    Ziyon Soltau [phone]

## 2024-08-21 NOTE — Progress Notes (Signed)
 Nitric started at 20ppm per MD.

## 2024-08-21 NOTE — Progress Notes (Signed)
 OT Cancellation Note  Patient Details Name: Justin Chaney MRN: [id number] DOB: 26-Jul-1996   Cancelled Treatment:    Reason Eval/Treat Not Completed: Patient not medically ready (sedated on vent. Will follow up once able to particiapte with therapy.)  [facility] 08/21/2024, 9:27 AM Kreg Sink, OT/L   Acute OT Clinical Specialist Acute Rehabilitation Services Pager [phone] Office [phone]

## 2024-08-21 NOTE — Progress Notes (Signed)
 This RN attempted to lighten sedation in efforts to achieve most accurate neurological exam.  Pt moving R side but agitated and no commands were followed.  Pt did receive a bolus of fentanyl  at this time.   RN also removed foley catheter at this time -- pt started coughing significantly and desaturation occurred.   RT came to bedside, Increased to 100% FiO2, and gradually increased PEEP to 16. RT and RN attempted to recruit pt with BVM.  O2 sat still only 80-85%.  Dr Sebastian made aware and came to bedside. CXR ordered and done as well as ABG. CCM consulted at this time and performed quick ECHO at bedside. Pt also placed on nitric oxide. O2 sat gradually improving as well as ABG. See MAR for other medications and orders for other plan of care.  Dr Sebastian spoke with pt's mother, Minus, and gave her an update.  RN also spoke with pt's legal guardian and updated her as well.   Last imported Vital Signs BP (!) 66/36   Pulse 86   Temp 100.2 F (37.9 C) (Axillary)   Resp 12   Ht 5' 11 (1.803 m)   Wt 120 kg   SpO2 92%   BMI 36.90 kg/m   Trending CBC Recent Labs    08/19/24 0620 08/19/24 1144 08/20/24 0459 08/20/24 0807 08/21/24 0417 08/21/24 1118 08/21/24 1345  WBC 6.3  --  12.2*  --  9.6  --   --   HGB 16.5  --  14.2   < > 11.8* 10.9* 10.5*  HCT 44.5  --  42.9   < > 36.1* 32.0* 31.0*  PLT 171 155 120*  --  114*  --   --    < > = values in this interval not displayed.    Trending Coag's Recent Labs    08/19/24 0152 08/19/24 1143 08/19/24 1144  APTT 35 32 32  INR 1.3* 1.3* 1.3*    Trending BMET Recent Labs    08/20/24 0459 08/20/24 0807 08/20/24 1151 08/21/24 0417 08/21/24 1118 08/21/24 1345  NA 154*   < > 153* 151* 153* 153*  K 4.3   < > 4.4 4.3 3.9 4.1  CL 121*  --  121* 120*  --   --   CO2 25  --  25 23  --   --   BUN 14  --  14 17  --   --   CREATININE 1.26*  --  1.04 0.98  --   --   GLUCOSE 131*  --  147* 140*  --   --    < > = values in this interval not  displayed.    LEBRON ROCKIE ORN  Trauma Response RN  Please call TRN at [phone] for further assistance.
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
## 2024-08-21 NOTE — Progress Notes (Signed)
 PT Cancellation Note  Patient Details Name: Justin Chaney MRN: [id number] DOB: 1996-03-25   Cancelled Treatment:    Reason Eval/Treat Not Completed: Patient not medically ready. Pt remains intubated and sedated due to pt becoming agitated/combative when not on sedation. Pt unable to participate in PT eval at this time. Acute PT to return as able, as appropriate to complete PT eval.  Norene Ames, PT, DPT Acute Rehabilitation Services Secure chat preferred Office #: [phone]    Norene CHRISTELLA Ames 08/21/2024, 8:01 AM
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
## 2024-08-21 NOTE — Progress Notes (Signed)
 Patient ID: Justin Chaney, male   DOB: 26-Nov-1995, 29 y.o.   MRN: [id number] I called his mother, Justin Chaney, and updated her on his worsening respiratory failure and the efforts we are undertaking to try to help him.  [name], MD, MPH, FACS Please use [email or web address] to contact on call provider
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
## 2024-08-21 NOTE — Progress Notes (Signed)
 ABG drawn and results given to Dr Harold and Dr Sebastian. PEEP decreased to 14 and FiO2 decreased to 80% at this time. RT notified.

## 2024-08-22 ENCOUNTER — Inpatient Hospital Stay (HOSPITAL_COMMUNITY)

## 2024-08-22 DIAGNOSIS — E559 Vitamin D deficiency, unspecified: Secondary | ICD-10-CM | POA: Diagnosis not present

## 2024-08-22 DIAGNOSIS — J80 Acute respiratory distress syndrome: Secondary | ICD-10-CM

## 2024-08-22 DIAGNOSIS — T07XXXA Unspecified multiple injuries, initial encounter: Secondary | ICD-10-CM | POA: Diagnosis not present

## 2024-08-22 LAB — POCT I-STAT 7, (LYTES, BLD GAS, ICA,H+H)
Acid-Base Excess: 0 mmol/L (ref 0.0–2.0)
Bicarbonate: 26.6 mmol/L (ref 20.0–28.0)
Calcium, Ion: 1.22 mmol/L (ref 1.15–1.40)
HCT: 32 % — ABNORMAL LOW (ref 39.0–52.0)
Hemoglobin: 10.9 g/dL — ABNORMAL LOW (ref 13.0–17.0)
O2 Saturation: 99 %
Patient temperature: 98
Potassium: 4.5 mmol/L (ref 3.5–5.1)
Sodium: 152 mmol/L — ABNORMAL HIGH (ref 135–145)
TCO2: 28 mmol/L (ref 22–32)
pCO2 arterial: 48.9 mmHg — ABNORMAL HIGH (ref 32–48)
pH, Arterial: 7.342 — ABNORMAL LOW (ref 7.35–7.45)
pO2, Arterial: 145 mmHg — ABNORMAL HIGH (ref 83–108)

## 2024-08-22 LAB — CBC
HCT: 34.5 % — ABNORMAL LOW (ref 39.0–52.0)
Hemoglobin: 10.9 g/dL — ABNORMAL LOW (ref 13.0–17.0)
MCH: 28.5 pg (ref 26.0–34.0)
MCHC: 31.6 g/dL (ref 30.0–36.0)
MCV: 90.3 fL (ref 80.0–100.0)
Platelets: 136 10*3/uL — ABNORMAL LOW (ref 150–400)
RBC: 3.82 MIL/uL — ABNORMAL LOW (ref 4.22–5.81)
RDW: 14.9 % (ref 11.5–15.5)
WBC: 8.7 10*3/uL (ref 4.0–10.5)
nRBC: 0 % (ref 0.0–0.2)

## 2024-08-22 LAB — BLOOD GAS, ARTERIAL
Acid-Base Excess: 0.9 mmol/L (ref 0.0–2.0)
Bicarbonate: 27.1 mmol/L (ref 20.0–28.0)
O2 Saturation: 100 %
Patient temperature: 38.1
pCO2 arterial: 51 mmHg — ABNORMAL HIGH (ref 32–48)
pH, Arterial: 7.33 — ABNORMAL LOW (ref 7.35–7.45)
pO2, Arterial: 155 mmHg — ABNORMAL HIGH (ref 83–108)

## 2024-08-22 LAB — BASIC METABOLIC PANEL WITH GFR
Anion gap: 8 (ref 5–15)
BUN: 24 mg/dL — ABNORMAL HIGH (ref 6–20)
CO2: 26 mmol/L (ref 22–32)
Calcium: 8 mg/dL — ABNORMAL LOW (ref 8.9–10.3)
Chloride: 117 mmol/L — ABNORMAL HIGH (ref 98–111)
Creatinine, Ser: 0.89 mg/dL (ref 0.61–1.24)
GFR, Estimated: >60 mL/min
Glucose, Bld: 167 mg/dL — ABNORMAL HIGH (ref 70–99)
Potassium: 4.6 mmol/L (ref 3.5–5.1)
Sodium: 151 mmol/L — ABNORMAL HIGH (ref 135–145)

## 2024-08-22 LAB — GLUCOSE, CAPILLARY
Glucose-Capillary: 150 mg/dL — ABNORMAL HIGH (ref 70–99)
Glucose-Capillary: 156 mg/dL — ABNORMAL HIGH (ref 70–99)
Glucose-Capillary: 178 mg/dL — ABNORMAL HIGH (ref 70–99)

## 2024-08-22 LAB — TRIGLYCERIDES: Triglycerides: 194 mg/dL — ABNORMAL HIGH

## 2024-08-22 MED ADMIN — Sennosides Tab 8.6 MG: 8.6 mg | NDC 00904725280

## 2024-08-22 MED ADMIN — Sennosides Tab 8.6 MG: 8.6 mg | NDC 00904725261

## 2024-08-22 MED ADMIN — Propofol IV Emul 1000 MG/100ML (10 MG/ML): 20 ug/kg/min | INTRAVENOUS | NDC 00069024801

## 2024-08-22 MED ADMIN — Fentanyl Citrate Preservative Free (PF) Inj 100 MCG/2ML: 50 ug | INTRAVENOUS

## 2024-08-22 MED ADMIN — ORAL CARE MOUTH RINSE: 15 mL | OROMUCOSAL | NDC 99999080097

## 2024-08-22 MED ADMIN — Docusate Sodium Liquid 150 MG/15ML: 100 mg | NDC 00904727966

## 2024-08-22 MED ADMIN — Furosemide Inj 10 MG/ML: 40 mg | INTRAVENOUS | NDC 71288020304

## 2024-08-22 MED ADMIN — Acetaminophen Tab 500 MG: 1000 mg | NDC 50580045711

## 2024-08-22 MED ADMIN — Levetiracetam Inj 500 MG/5ML (100 MG/ML): 500 mg | INTRAVENOUS | NDC 72572036001

## 2024-08-22 MED ADMIN — Nutritional Supplement Liquid: 1000 mL | NDC 7007462720

## 2024-08-22 MED ADMIN — CEFEPIME 2 GM IVPB: 2 g | INTRAVENOUS | NDC 00409973501

## 2024-08-22 MED ADMIN — CEFEPIME 2 GM IVPB: 2 g | INTRAVENOUS | NDC 60505614700

## 2024-08-22 MED ADMIN — Enoxaparin Sodium Inj Soln Pref Syr 40 MG/0.4ML: 40 mg | SUBCUTANEOUS | NDC 00781324602

## 2024-08-22 MED ADMIN — Chlorhexidine Gluconate Pads 2%: 6 | TOPICAL | NDC 53462070523

## 2024-08-22 MED ADMIN — Cholecalciferol Tab 25 MCG (1000 Unit): 1000 [IU] | NDC 2055503300

## 2024-08-22 MED ADMIN — Quetiapine Fumarate Tab 50 MG: 50 mg | NDC 67877024901

## 2024-08-22 MED ADMIN — Dexamethasone Sod Phosphate Preservative Free Inj 10 MG/ML: 20 mg | INTRAVENOUS | NDC 70069002101

## 2024-08-22 MED ADMIN — Hydralazine HCl Inj 20 MG/ML: 10 mg | INTRAVENOUS | NDC 72572026501

## 2024-08-22 MED ADMIN — Pantoprazole Sodium For IV Soln 40 MG (Base Equiv): 40 mg | INTRAVENOUS | NDC 00008092351

## 2024-08-22 MED ADMIN — Furosemide Inj 10 MG/ML: 40 mg | INTRAVENOUS | NDC 25021032004

## 2024-08-22 MED ADMIN — Polyethylene Glycol 3350 Oral Packet 17 GM: 17 g | NDC 00904693186

## 2024-08-22 MED FILL — Oxycodone HCl Tab 5 MG: 5.0000 mg | ORAL | Qty: 2 | Status: AC

## 2024-08-22 MED FILL — Furosemide Inj 10 MG/ML: 40.0000 mg | INTRAMUSCULAR | Qty: 4 | Status: AC
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
## 2024-08-22 NOTE — Progress Notes (Signed)
 OT Cancellation Note  Patient Details Name: Justin Chaney MRN: [id number] DOB: 06/03/1996   Cancelled Treatment:    Reason Eval/Treat Not Completed: Other (comment) (Pt remains sedated, with tenuous respiratory status/demands. Will continue efforts as medically appropriate.)   Thula Stewart M. [place], OTR/L [facility] Acute Rehabilitation Services [phone] Secure Chat Preferred  Sherron Mummert 08/22/2024, 10:44 AM

## 2024-08-22 NOTE — Procedures (Signed)
 Cortrak  Person Inserting Tube:  Justin Chaney, RD Tube Type:  Cortrak - 55 inches Tube Size:  10 Tube Location:  Left nare Secured by: Bridle Technique Used to Measure Tube Placement:  Marking at nare/corner of mouth Cortrak Secured At:  63 cm Initial Placement Verification:  Xray  Cortrak Tube Team Note:  Consult received to place a Cortrak feeding tube.   Post pyloric placement requested. Xray required to confirm placement prior to use.  If the tube becomes dislodged please keep the tube and contact the Cortrak team at [email or web address] for replacement.  If after hours and replacement cannot be delayed, place a NG tube and confirm placement with an abdominal x-ray.    Justin Elihue, MS, RDN, LDN Clinical Dietitian I Please reach out via secure chat

## 2024-08-22 NOTE — Progress Notes (Signed)
 PT Cancellation Note  Patient Details Name: Justin Chaney MRN: [id number] DOB: September 13, 1995   Cancelled Treatment:    Reason Eval/Treat Not Completed: Patient not medically ready. Pt continues with tenuous respiratory status and remains sedated. Acute PT to return as able, as appropriate to perform PT eval.  Norene Ames, PT, DPT Acute Rehabilitation Services Secure chat preferred Office #: [phone]    Norene CHRISTELLA Ames 08/22/2024, 7:56 AM
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
## 2024-08-23 ENCOUNTER — Encounter (HOSPITAL_COMMUNITY): Payer: Self-pay | Admitting: Student

## 2024-08-23 DIAGNOSIS — E870 Hyperosmolality and hypernatremia: Secondary | ICD-10-CM | POA: Diagnosis not present

## 2024-08-23 DIAGNOSIS — J9601 Acute respiratory failure with hypoxia: Secondary | ICD-10-CM | POA: Diagnosis not present

## 2024-08-23 DIAGNOSIS — J15211 Pneumonia due to Methicillin susceptible Staphylococcus aureus: Secondary | ICD-10-CM | POA: Diagnosis not present

## 2024-08-23 DIAGNOSIS — J9602 Acute respiratory failure with hypercapnia: Secondary | ICD-10-CM | POA: Diagnosis not present

## 2024-08-23 LAB — GLUCOSE, CAPILLARY
Glucose-Capillary: 164 mg/dL — ABNORMAL HIGH (ref 70–99)
Glucose-Capillary: 168 mg/dL — ABNORMAL HIGH (ref 70–99)
Glucose-Capillary: 169 mg/dL — ABNORMAL HIGH (ref 70–99)
Glucose-Capillary: 175 mg/dL — ABNORMAL HIGH (ref 70–99)
Glucose-Capillary: 178 mg/dL — ABNORMAL HIGH (ref 70–99)
Glucose-Capillary: 179 mg/dL — ABNORMAL HIGH (ref 70–99)

## 2024-08-23 LAB — BASIC METABOLIC PANEL WITH GFR
Anion gap: 8 (ref 5–15)
BUN: 39 mg/dL — ABNORMAL HIGH (ref 6–20)
CO2: 26 mmol/L (ref 22–32)
Calcium: 8.4 mg/dL — ABNORMAL LOW (ref 8.9–10.3)
Chloride: 118 mmol/L — ABNORMAL HIGH (ref 98–111)
Creatinine, Ser: 1.15 mg/dL (ref 0.61–1.24)
GFR, Estimated: >60 mL/min
Glucose, Bld: 201 mg/dL — ABNORMAL HIGH (ref 70–99)
Potassium: 4.3 mmol/L (ref 3.5–5.1)
Sodium: 153 mmol/L — ABNORMAL HIGH (ref 135–145)

## 2024-08-23 LAB — CBC
HCT: 33.6 % — ABNORMAL LOW (ref 39.0–52.0)
Hemoglobin: 10.6 g/dL — ABNORMAL LOW (ref 13.0–17.0)
MCH: 28.2 pg (ref 26.0–34.0)
MCHC: 31.5 g/dL (ref 30.0–36.0)
MCV: 89.4 fL (ref 80.0–100.0)
Platelets: 187 10*3/uL (ref 150–400)
RBC: 3.76 MIL/uL — ABNORMAL LOW (ref 4.22–5.81)
RDW: 14.8 % (ref 11.5–15.5)
WBC: 8.6 10*3/uL (ref 4.0–10.5)
nRBC: 0.9 % — ABNORMAL HIGH (ref 0.0–0.2)

## 2024-08-23 LAB — POCT I-STAT 7, (LYTES, BLD GAS, ICA,H+H)
Acid-Base Excess: 1 mmol/L (ref 0.0–2.0)
Bicarbonate: 26.8 mmol/L (ref 20.0–28.0)
Calcium, Ion: 1.2 mmol/L (ref 1.15–1.40)
HCT: 31 % — ABNORMAL LOW (ref 39.0–52.0)
Hemoglobin: 10.5 g/dL — ABNORMAL LOW (ref 13.0–17.0)
O2 Saturation: 99 %
Patient temperature: 98.6
Potassium: 4.3 mmol/L (ref 3.5–5.1)
Sodium: 152 mmol/L — ABNORMAL HIGH (ref 135–145)
TCO2: 28 mmol/L (ref 22–32)
pCO2 arterial: 45.9 mmHg (ref 32–48)
pH, Arterial: 7.375 (ref 7.35–7.45)
pO2, Arterial: 120 mmHg — ABNORMAL HIGH (ref 83–108)

## 2024-08-23 MED ADMIN — Sennosides Tab 8.6 MG: 8.6 mg | NDC 00904725261

## 2024-08-23 MED ADMIN — Propofol IV Emul 1000 MG/100ML (10 MG/ML): 20 ug/kg/min | INTRAVENOUS | NDC 00069024801

## 2024-08-23 MED ADMIN — Water For Irrigation, Sterile Irrigation Soln: 200 mL | NDC 99999080061

## 2024-08-23 MED ADMIN — ORAL CARE MOUTH RINSE: 15 mL | OROMUCOSAL | NDC 99999080097

## 2024-08-23 MED ADMIN — Fentanyl Citrate Preservative Free (PF) Inj 100 MCG/2ML: 100 ug/h | INTRAVENOUS | NDC 72572017201

## 2024-08-23 MED ADMIN — Docusate Sodium Liquid 150 MG/15ML: 100 mg | NDC 00904727966

## 2024-08-23 MED ADMIN — Insulin Aspart Inj Soln 100 Unit/ML: 2 [IU] | SUBCUTANEOUS | NDC 73070010011

## 2024-08-23 MED ADMIN — Acetaminophen Tab 500 MG: 1000 mg | NDC 50580045711

## 2024-08-23 MED ADMIN — Levetiracetam Inj 500 MG/5ML (100 MG/ML): 500 mg | INTRAVENOUS | NDC 72572036001

## 2024-08-23 MED ADMIN — CEFEPIME 2 GM IVPB: 2 g | INTRAVENOUS | NDC 00409973501

## 2024-08-23 MED ADMIN — Enoxaparin Sodium Inj Soln Pref Syr 40 MG/0.4ML: 40 mg | SUBCUTANEOUS | NDC 00781324602

## 2024-08-23 MED ADMIN — Chlorhexidine Gluconate Pads 2%: 6 | TOPICAL | NDC 53462070523

## 2024-08-23 MED ADMIN — Polyethylene Glycol 3350 Oral Packet 17 GM: 17 g | NDC 00904693186

## 2024-08-23 MED ADMIN — Cholecalciferol Tab 25 MCG (1000 Unit): 1000 [IU] | NDC 2055503300

## 2024-08-23 MED ADMIN — Quetiapine Fumarate Tab 50 MG: 50 mg | NDC 67877024901

## 2024-08-23 MED ADMIN — Dexamethasone Sod Phosphate Preservative Free Inj 10 MG/ML: 20 mg | INTRAVENOUS | NDC 25021005301

## 2024-08-23 MED ADMIN — Pantoprazole Sodium For IV Soln 40 MG (Base Equiv): 40 mg | INTRAVENOUS | NDC 00008092351

## 2024-08-23 MED ADMIN — Furosemide Inj 10 MG/ML: 20 mg | INTRAVENOUS | NDC 71288020302

## 2024-08-23 MED FILL — Insulin Aspart Inj Soln 100 Unit/ML: 0.0000 [IU] | INTRAMUSCULAR | Qty: 2 | Status: AC

## 2024-08-23 MED FILL — Insulin Aspart Inj Soln 100 Unit/ML: 0.0000 [IU] | INTRAMUSCULAR | Qty: 1 | Status: AC

## 2024-08-23 MED FILL — Insulin Aspart Inj Soln 100 Unit/ML: 0.0000 [IU] | INTRAMUSCULAR | Qty: 3 | Status: AC

## 2024-08-23 MED FILL — Polyethylene Glycol 3350 Oral Packet 17 GM: 17.0000 g | ORAL | Qty: 1 | Status: AC

## 2024-08-23 MED FILL — Furosemide Inj 10 MG/ML: 20.0000 mg | INTRAMUSCULAR | Qty: 2 | Status: AC
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
## 2024-08-24 ENCOUNTER — Inpatient Hospital Stay (HOSPITAL_COMMUNITY): Admitting: Anesthesiology

## 2024-08-24 DIAGNOSIS — K56609 Unspecified intestinal obstruction, unspecified as to partial versus complete obstruction: Secondary | ICD-10-CM

## 2024-08-24 DIAGNOSIS — J9602 Acute respiratory failure with hypercapnia: Secondary | ICD-10-CM | POA: Diagnosis not present

## 2024-08-24 DIAGNOSIS — J9601 Acute respiratory failure with hypoxia: Secondary | ICD-10-CM | POA: Diagnosis not present

## 2024-08-24 DIAGNOSIS — J15211 Pneumonia due to Methicillin susceptible Staphylococcus aureus: Secondary | ICD-10-CM | POA: Diagnosis not present

## 2024-08-24 DIAGNOSIS — E870 Hyperosmolality and hypernatremia: Secondary | ICD-10-CM | POA: Diagnosis not present

## 2024-08-24 LAB — CULTURE, RESPIRATORY W GRAM STAIN: Culture: NO GROWTH

## 2024-08-24 LAB — BASIC METABOLIC PANEL WITH GFR
Anion gap: 8 (ref 5–15)
BUN: 38 mg/dL — ABNORMAL HIGH (ref 6–20)
CO2: 27 mmol/L (ref 22–32)
Calcium: 8.3 mg/dL — ABNORMAL LOW (ref 8.9–10.3)
Chloride: 119 mmol/L — ABNORMAL HIGH (ref 98–111)
Creatinine, Ser: 0.85 mg/dL (ref 0.61–1.24)
GFR, Estimated: >60 mL/min
Glucose, Bld: 172 mg/dL — ABNORMAL HIGH (ref 70–99)
Potassium: 4 mmol/L (ref 3.5–5.1)
Sodium: 154 mmol/L — ABNORMAL HIGH (ref 135–145)

## 2024-08-24 LAB — CBC
HCT: 33.6 % — ABNORMAL LOW (ref 39.0–52.0)
Hemoglobin: 10.5 g/dL — ABNORMAL LOW (ref 13.0–17.0)
MCH: 28.4 pg (ref 26.0–34.0)
MCHC: 31.3 g/dL (ref 30.0–36.0)
MCV: 90.8 fL (ref 80.0–100.0)
Platelets: 227 10*3/uL (ref 150–400)
RBC: 3.7 MIL/uL — ABNORMAL LOW (ref 4.22–5.81)
RDW: 14.8 % (ref 11.5–15.5)
WBC: 11.6 10*3/uL — ABNORMAL HIGH (ref 4.0–10.5)
nRBC: 1.3 % — ABNORMAL HIGH (ref 0.0–0.2)

## 2024-08-24 LAB — HEMOGLOBIN A1C
Hgb A1c MFr Bld: 5.4 % (ref 4.8–5.6)
Mean Plasma Glucose: 108.28 mg/dL

## 2024-08-24 LAB — GLUCOSE, CAPILLARY
Glucose-Capillary: 137 mg/dL — ABNORMAL HIGH (ref 70–99)
Glucose-Capillary: 137 mg/dL — ABNORMAL HIGH (ref 70–99)
Glucose-Capillary: 142 mg/dL — ABNORMAL HIGH (ref 70–99)
Glucose-Capillary: 147 mg/dL — ABNORMAL HIGH (ref 70–99)
Glucose-Capillary: 149 mg/dL — ABNORMAL HIGH (ref 70–99)

## 2024-08-24 LAB — PHOSPHORUS: Phosphorus: 2.1 mg/dL — ABNORMAL LOW (ref 2.5–4.6)

## 2024-08-24 MED ADMIN — Sennosides Tab 8.6 MG: 8.6 mg | NDC 00904725261

## 2024-08-24 MED ADMIN — Propofol IV Emul 1000 MG/100ML (10 MG/ML): 40 ug/kg/min | INTRAVENOUS | NDC 00069024801

## 2024-08-24 MED ADMIN — Propofol IV Emul 1000 MG/100ML (10 MG/ML): 30 ug/kg/min | INTRAVENOUS | NDC 00069024801

## 2024-08-24 MED ADMIN — Propofol IV Emul 1000 MG/100ML (10 MG/ML): 20 ug/kg/min | INTRAVENOUS | NDC 00069024801

## 2024-08-24 MED ADMIN — Water For Irrigation, Sterile Irrigation Soln: 200 mL | NDC 99999080061

## 2024-08-24 MED ADMIN — ORAL CARE MOUTH RINSE: 15 mL | OROMUCOSAL | NDC 99999080097

## 2024-08-24 MED ADMIN — Fentanyl Citrate Preservative Free (PF) Inj 100 MCG/2ML: 300 ug/h | INTRAVENOUS | NDC 72572017201

## 2024-08-24 MED ADMIN — Docusate Sodium Liquid 150 MG/15ML: 100 mg | NDC 00904727966

## 2024-08-24 MED ADMIN — Insulin Aspart Inj Soln 100 Unit/ML: 1 [IU] | SUBCUTANEOUS | NDC 73070010011

## 2024-08-24 MED ADMIN — Insulin Aspart Inj Soln 100 Unit/ML: 2 [IU] | SUBCUTANEOUS | NDC 73070010011

## 2024-08-24 MED ADMIN — Acetaminophen Tab 500 MG: 1000 mg | NDC 50580045711

## 2024-08-24 MED ADMIN — Levetiracetam Inj 500 MG/5ML (100 MG/ML): 500 mg | INTRAVENOUS | NDC 72572036001

## 2024-08-24 MED ADMIN — Levetiracetam Inj 500 MG/5ML (100 MG/ML): 500 mg | INTRAVENOUS | NDC 55150017705

## 2024-08-24 MED ADMIN — Nutritional Supplement Liquid: 1000 mL | NDC 7007462720

## 2024-08-24 MED ADMIN — Nutritional Supplement Liquid: 1000 mL | NDC 7007458016

## 2024-08-24 MED ADMIN — CEFEPIME 2 GM IVPB: 2 g | INTRAVENOUS | NDC 00409973501

## 2024-08-24 MED ADMIN — Enoxaparin Sodium Inj Soln Pref Syr 40 MG/0.4ML: 40 mg | SUBCUTANEOUS | NDC 71839011010

## 2024-08-24 MED ADMIN — Chlorhexidine Gluconate Pads 2%: 6 | TOPICAL | NDC 53462070523

## 2024-08-24 MED ADMIN — Polyethylene Glycol 3350 Oral Packet 17 GM: 17 g | NDC 00904693186

## 2024-08-24 MED ADMIN — Cholecalciferol Tab 25 MCG (1000 Unit): 1000 [IU] | NDC 00904582493

## 2024-08-24 MED ADMIN — Quetiapine Fumarate Tab 50 MG: 50 mg | NDC 67877024901

## 2024-08-24 MED ADMIN — Metoprolol Tartrate IV Soln 5 MG/5ML: 5 mg | INTRAVENOUS | NDC 00409177815

## 2024-08-24 MED ADMIN — Dexamethasone Sod Phosphate Preservative Free Inj 10 MG/ML: 20 mg | INTRAVENOUS | NDC 25021005301

## 2024-08-24 MED ADMIN — Hydralazine HCl Inj 20 MG/ML: 10 mg | INTRAVENOUS | NDC 72572026501

## 2024-08-24 MED ADMIN — Pantoprazole Sodium For IV Soln 40 MG (Base Equiv): 40 mg | INTRAVENOUS | NDC 00008092351

## 2024-08-24 MED ADMIN — Rocuronium Bromide IV Soln Pref Syr 100 MG/10ML (10 MG/ML): 10 mg | INTRAVENOUS | NDC 99999070048

## 2024-08-24 MED ADMIN — Rocuronium Bromide IV Soln Pref Syr 100 MG/10ML (10 MG/ML): 20 mg | INTRAVENOUS | NDC 99999070048

## 2024-08-24 MED ADMIN — Rocuronium Bromide IV Soln Pref Syr 100 MG/10ML (10 MG/ML): 50 mg | INTRAVENOUS | NDC 99999070048

## 2024-08-24 MED ADMIN — Rocuronium Bromide IV Soln Pref Syr 100 MG/10ML (10 MG/ML): 30 mg | INTRAVENOUS | NDC 99999070048

## 2024-08-24 MED ADMIN — Esmolol HCl Inj 100 MG/10ML: 40 mg | INTRAVENOUS | NDC 67457018210

## 2024-08-24 MED ADMIN — Furosemide Inj 10 MG/ML: 20 mg | INTRAVENOUS | NDC 71288020302

## 2024-08-24 MED ADMIN — Fentanyl Citrate Preservative Free (PF) Inj 250 MCG/5ML: 50 ug | INTRAVENOUS | NDC 72572017125

## 2024-08-24 MED ADMIN — Fentanyl Citrate Preservative Free (PF) Inj 250 MCG/5ML: 100 ug | INTRAVENOUS | NDC 72572017125

## 2024-08-24 MED ADMIN — Hydromorphone HCl Inj 1 MG/ML: .5 mg | INTRAVENOUS | NDC 76045000901

## 2024-08-24 MED ADMIN — POTASSIUM PHOSPHATE 30MMOL/500ML IV SOLN: 30 mmol | INTRAVENOUS | NDC 80830169301

## 2024-08-24 MED ADMIN — Dexmedetomidine HCl in NaCl 0.9% IV Soln 80 MCG/20ML: 4 ug | INTRAVENOUS | NDC 00781349395

## 2024-08-24 MED ADMIN — Dexmedetomidine HCl in NaCl 0.9% IV Soln 80 MCG/20ML: 8 ug | INTRAVENOUS | NDC 00781349395

## 2024-08-24 MED ADMIN — Midazolam HCl Inj PF 2 MG/2ML (Base Equivalent): 2 mg | INTRAVENOUS | NDC 00409000125

## 2024-08-24 MED ADMIN — Sodium Chloride Irrigation Soln 0.9%: 2000 mL | NDC 99999050048

## 2024-08-24 MED FILL — Midazolam HCl Inj 2 MG/2ML (Base Equivalent): INTRAMUSCULAR | Qty: 2 | Status: AC

## 2024-08-24 MED FILL — Hydromorphone HCl Inj 1 MG/ML: INTRAMUSCULAR | Qty: 0.5 | Status: AC

## 2024-08-24 MED FILL — Fentanyl Citrate Preservative Free (PF) Inj 250 MCG/5ML: INTRAMUSCULAR | Qty: 5 | Status: AC

## 2024-08-24 MED FILL — Rocuronium Bromide IV Soln Pref Syr 100 MG/10ML (10 MG/ML): INTRAVENOUS | Qty: 10 | Status: AC

## 2024-08-24 MED FILL — Dexmedetomidine HCl in NaCl 0.9% IV Soln 400 MCG/100ML: 0.0000 ug/kg/h | INTRAVENOUS | Qty: 100 | Status: AC

## 2024-08-24 MED FILL — Potassium Phosphates Inj 15 mM/5ML (Phos) 22 mEq/5ML (K): 30.0000 mmol | INTRAVENOUS | Qty: 10 | Status: AC

## 2024-08-24 MED FILL — Propofol IV Emul 200 MG/20ML (10 MG/ML): INTRAVENOUS | Qty: 20 | Status: AC

## 2024-08-24 MED FILL — Furosemide Inj 10 MG/ML: 20.0000 mg | INTRAMUSCULAR | Qty: 2 | Status: AC
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
## 2024-08-24 NOTE — Anesthesia Postprocedure Evaluation (Signed)
"   Anesthesia Post Note  Patient: Riordan Holaway  Procedure(s) Performed: LAPAROTOMY, EXPLORATORY and wash out (Abdomen) APPLICATION, WOUND VAC (Abdomen)     Patient location during evaluation: SICU Anesthesia Type: General Level of consciousness: sedated Pain management: pain level controlled Vital Signs Assessment: post-procedure vital signs reviewed and stable Respiratory status: patient remains intubated per anesthesia plan Cardiovascular status: stable Postop Assessment: no apparent nausea or vomiting Anesthetic complications: no   No notable events documented.  Last Vitals:  Vitals:   08/24/24 1300 08/24/24 1400  BP:    Pulse: (!) 118 (!) 103  Resp: 20 (!) 22  Temp:    SpO2: 95% 94%    Last Pain:  Vitals:   08/24/24 0800  TempSrc: Axillary  PainSc:                  Chadley Dziedzic      "

## 2024-08-24 NOTE — Progress Notes (Signed)
 OT Cancellation Note  Patient Details Name: Jamarkus Lisbon MRN: [id number] DOB: 1995/08/28   Cancelled Treatment:    Reason Eval/Treat Not Completed: Other (comment) (Pt remains intubated/sedated, now returning to OR. Has not been appropriate for OT eval x3 days, will sign-off and await new consult once medically appropriate.)   Bobbye Petti M. Burma, OTR/L [facility] Acute Rehabilitation Services [phone] Secure Chat Preferred  Rikki Burma 08/24/2024, 10:39 AM

## 2024-08-24 NOTE — Progress Notes (Signed)
Sputum sample obtained and sent down to main lab without complications.  

## 2024-08-24 NOTE — Progress Notes (Signed)
 Notified by RN of bleeding from midline wound. Patient seen and examined. Moderate bleeding present. Appeared to be coming from a focal area of the central aspect of the wound. Three staples removed to eval for subcutaneous source of bleeding. Immediate encounter of bowel. Op note reviewed and fascia was closed at the initial operation. Will plan for RTOR for fascial closure. Informed consent obtained from legal guardian, Bonney Mura, via phone. All questions answered.   Dreama GEANNIE Hanger, MD General and Trauma Surgery [facility] Surgery

## 2024-08-24 NOTE — Op Note (Signed)
" ° °  Operative Note   Date: 08/24/2024  Procedure: exploratory laparotomy, abdominal washout, abthera wound vac application   Pre-op diagnosis: evisceration Post-op diagnosis: same  Indication and clinical history: The patient is a 30 y.o. year old male with evisceration     Surgeon: Dreama GEANNIE Hanger, MD  Anesthesiologist: Leopoldo, MD Anesthesia: General  Findings:  Specimen: none EBL: 5cc Drains/Implants: none  Disposition: ICU - intubated and hemodynamically stable.  Description of procedure: The patient was positioned supine on the operating room table. General anesthetic induction and intubation were uneventful. Foley catheter insertion was performed and was atraumatic. Time-out was performed verifying correct patient, procedure, signature of informed consent, and administration of pre-operative antibiotics. The abdomen was prepped and draped in the usual sterile fashion.  The previously placed staples were removed.  There was an obvious fascial defect as a result of disruption of the fascial sutures.  The remaining suture was removed and the abdomen explored.  The of the results, and anastomosis was inspected and appeared intact.  Closure of the fascia was begun inferiorly using #1 looped PDS suture with intervening #1 Ethilon retention sutures.  After closure of approximately one third of the wound, elevated peak pressures were noted to 38.  Numerous efforts were made at reduction, including release of the suture, ensuring no kinks in the ventilator tubing or endotracheal tube, suctioning of the airway, and ensuring no kinks in the urinary drainage catheter.  The patient was transitioned to pressure control ventilation for volume control with mild improvement in his peak pressures.  With continued manipulation peak pressures were able to be reduced to 35 and the decision was made to abort further closure.  An ABThera wound VAC dressing was placed as sterile dressing.  All sponge and  instrument counts were correct at the conclusion of the procedure. The patient was transported to the ICU in stable condition. There were no complications.  Bronchoscopy was performed by anesthesia at the conclusion of the procedure due to difficulties in passing suction catheter, however this was unrevealing.   Dreama GEANNIE Hanger, MD General and Trauma Surgery [facility] Surgery  "

## 2024-08-24 NOTE — Transfer of Care (Signed)
 Immediate Anesthesia Transfer of Care Note  Patient: Justin Chaney  Procedure(s) Performed: LAPAROTOMY, EXPLORATORY and wash out (Abdomen) APPLICATION, WOUND VAC (Abdomen)  Patient Location: PACU  Anesthesia Type:General  Level of Consciousness: sedated  Airway & Oxygen Therapy: Patient remains intubated per anesthesia plan  Post-op Assessment: Report given to RN and Post -op Vital signs reviewed and stable  Post vital signs: Reviewed and stable  Last Vitals:  Vitals Value Taken Time  BP    Temp    Pulse 109 08/24/24 12:34  Resp 17 08/24/24 12:34  SpO2 95 % 08/24/24 12:34  Vitals shown include unfiled device data.  Last Pain:  Vitals:   08/24/24 0800  TempSrc: Axillary  PainSc:          Complications: No notable events documented.

## 2024-08-24 NOTE — Progress Notes (Signed)
 PT Cancellation Note  Patient Details Name: Raed Schalk MRN: [id number] DOB: May 20, 1996   Cancelled Treatment:    Reason Eval/Treat Not Completed: Patient not medically ready (pt to return to OR and not currently appropriate. Will sign off and await new order as medically appropriate and able to participate)   Hashem Goynes B Alyxis Grippi 08/24/2024, 9:21 AM Lenoard SQUIBB, PT Acute Rehabilitation Services Office: [phone]

## 2024-08-24 NOTE — TOC Progression Note (Signed)
 Transition of Care [facility]) - Progression Note    Patient Details  Name: Justin Chaney MRN: [id number] Date of Birth: 1995/11/09  [facility]) CM/SW Contact  Felix Meras M, RN Phone Number: 08/24/2024, 3:32 PM  Clinical Narrative:    Patient remains intubated and sedated.  Back to OR today for bleeding from wound with evisceration.   Inpatient Care Management will continue to follow as patient progresses.      Barriers to Discharge: Continued Medical Work up               Expected Discharge Plan and Services   Discharge Planning Services: CM Consult   Living arrangements for the past 2 months: Homeless                                       Social Drivers of Health (SDOH) Interventions SDOH Screenings   Food Insecurity: Patient Unable To Answer (08/19/2024)    Readmission Risk Interventions     No data to display         Mliss MICAEL Fass, RN, BSN  Trauma/Neuro ICU Case Manager [phone]

## 2024-08-24 NOTE — Progress Notes (Addendum)
 Brief Nutrition Note   Pt underwent ex lap, abdominal washout and wound vac placement in OR today 1/23. Pt still has no signs of return of bowel function, no bowel movement yet (on aggressive bowel regimen). Trickle feeds restarted following OR but will continue at trickle rate until signs of bowel function return. Phosphorus low this morning requiring repletion.  Meds:  FWF 200 ml q6h (goal Na < 155), miralax  BID, senna BID, colace BID, Vitamin D3 1000 units daily, potassium phosphate 30 mmol  Labs: Na 154, Phos 2.1, vitamin D  <6.0   INTERVENTION:    Continue trickle tube feeding via Cortrak tube: Pivot 1.5 @ 20 ml/h    As able recommend advance to goal: Pivot 1.5 @ 75 ml/hr  Provides 2700 kcal, 168 gm protein, 1368 ml free water  daily   Pt is at risk for refeeding syndrome given suspected poor nutrition status PTA. Monitor magnesium and phosphorus daily x 4 occurrences or until WNL, MD to replete as needed.      Josette Glance, MS, RDN, LDN Clinical Dietitian I Please reach out via secure chat

## 2024-08-24 NOTE — Progress Notes (Signed)
 Clinical update provided to legal guardian, Bonney Mura, and mother Liborio Saccente, via phone. All questions answered. Plan for RTOR for attempted closure based on pulm status. Informed consent obtained from legal guardian.   Dreama GEANNIE Hanger, MD General and Trauma Surgery [facility] Surgery

## 2024-08-24 NOTE — Anesthesia Preprocedure Evaluation (Addendum)
"                                    Anesthesia Evaluation  Patient identified by MRN, date of birth, ID band Patient unresponsive    Reviewed: Allergy & Precautions, NPO status , Patient's Chart, lab work & pertinent test results, Unable to perform ROS - Chart review onlyPreop documentation limited or incomplete due to emergent nature of procedure.  History of Anesthesia Complications Negative for: history of anesthetic complications  Airway Mallampati: Intubated       Dental   Pulmonary    + rhonchi        Cardiovascular  Rhythm:Regular     Neuro/Psych    GI/Hepatic   Endo/Other    Renal/GU      Musculoskeletal   Abdominal   Peds  Hematology   Anesthesia Other Findings Abdominal wound evisceration   Reproductive/Obstetrics                              Anesthesia Physical Anesthesia Plan  ASA: 4  Anesthesia Plan: General   Post-op Pain Management:    Induction:   PONV Risk Score and Plan: 2  Airway Management Planned: Oral ETT  Additional Equipment:   Intra-op Plan:   Post-operative Plan: Post-operative intubation/ventilation  Informed Consent:      History available from chart only and Only emergency history available  Plan Discussed with: CRNA and Surgeon  Anesthesia Plan Comments:          Anesthesia Quick Evaluation  "
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
## 2024-08-25 LAB — BASIC METABOLIC PANEL WITH GFR
Anion gap: 9 (ref 5–15)
BUN: 31 mg/dL — ABNORMAL HIGH (ref 6–20)
CO2: 27 mmol/L (ref 22–32)
Calcium: 7.8 mg/dL — ABNORMAL LOW (ref 8.9–10.3)
Chloride: 119 mmol/L — ABNORMAL HIGH (ref 98–111)
Creatinine, Ser: 0.8 mg/dL (ref 0.61–1.24)
GFR, Estimated: >60 mL/min
Glucose, Bld: 144 mg/dL — ABNORMAL HIGH (ref 70–99)
Potassium: 4.5 mmol/L (ref 3.5–5.1)
Sodium: 155 mmol/L — ABNORMAL HIGH (ref 135–145)

## 2024-08-25 LAB — TRIGLYCERIDES: Triglycerides: 267 mg/dL — ABNORMAL HIGH

## 2024-08-25 LAB — CBC
HCT: 35.3 % — ABNORMAL LOW (ref 39.0–52.0)
Hemoglobin: 10.9 g/dL — ABNORMAL LOW (ref 13.0–17.0)
MCH: 28.4 pg (ref 26.0–34.0)
MCHC: 30.9 g/dL (ref 30.0–36.0)
MCV: 91.9 fL (ref 80.0–100.0)
Platelets: 287 10*3/uL (ref 150–400)
RBC: 3.84 MIL/uL — ABNORMAL LOW (ref 4.22–5.81)
RDW: 15.2 % (ref 11.5–15.5)
WBC: 12.3 10*3/uL — ABNORMAL HIGH (ref 4.0–10.5)
nRBC: 1.9 % — ABNORMAL HIGH (ref 0.0–0.2)

## 2024-08-25 LAB — GLUCOSE, CAPILLARY
Glucose-Capillary: 141 mg/dL — ABNORMAL HIGH (ref 70–99)
Glucose-Capillary: 107 mg/dL — ABNORMAL HIGH (ref 70–99)
Glucose-Capillary: 129 mg/dL — ABNORMAL HIGH (ref 70–99)
Glucose-Capillary: 138 mg/dL — ABNORMAL HIGH (ref 70–99)
Glucose-Capillary: 156 mg/dL — ABNORMAL HIGH (ref 70–99)
Glucose-Capillary: 165 mg/dL — ABNORMAL HIGH (ref 70–99)

## 2024-08-25 MED ADMIN — Sennosides Tab 8.6 MG: 8.6 mg | NDC 00904725261

## 2024-08-25 MED ADMIN — Propofol IV Emul 1000 MG/100ML (10 MG/ML): 70 ug/kg/min | INTRAVENOUS | NDC 23155034533

## 2024-08-25 MED ADMIN — Propofol IV Emul 1000 MG/100ML (10 MG/ML): 40 ug/kg/min | INTRAVENOUS | NDC 23155034533

## 2024-08-25 MED ADMIN — ORAL CARE MOUTH RINSE: 15 mL | OROMUCOSAL | NDC 99999080097

## 2024-08-25 MED ADMIN — Fentanyl Citrate Preservative Free (PF) Inj 100 MCG/2ML: 300 ug/h | INTRAVENOUS | NDC 72572017201

## 2024-08-25 MED ADMIN — Docusate Sodium Liquid 150 MG/15ML: 100 mg | NDC 00904727966

## 2024-08-25 MED ADMIN — Insulin Aspart Inj Soln 100 Unit/ML: 1 [IU] | SUBCUTANEOUS | NDC 73070010011

## 2024-08-25 MED ADMIN — Insulin Aspart Inj Soln 100 Unit/ML: 2 [IU] | SUBCUTANEOUS | NDC 73070010011

## 2024-08-25 MED ADMIN — Acetaminophen Tab 500 MG: 1000 mg | NDC 50580045711

## 2024-08-25 MED ADMIN — Levetiracetam Inj 500 MG/5ML (100 MG/ML): 500 mg | INTRAVENOUS | NDC 00409188622

## 2024-08-25 MED ADMIN — CEFEPIME 2 GM IVPB: 2 g | INTRAVENOUS | NDC 60505614700

## 2024-08-25 MED ADMIN — CEFEPIME 2 GM IVPB: 2 g | INTRAVENOUS | NDC 00409973501

## 2024-08-25 MED ADMIN — Enoxaparin Sodium Inj Soln Pref Syr 40 MG/0.4ML: 40 mg | SUBCUTANEOUS | NDC 71839011010

## 2024-08-25 MED ADMIN — Chlorhexidine Gluconate Pads 2%: 6 | TOPICAL | NDC 53462070523

## 2024-08-25 MED ADMIN — Water For Irrigation, Sterile Irrigation Soln: 200 mL | NDC 99999080061

## 2024-08-25 MED ADMIN — Polyethylene Glycol 3350 Oral Packet 17 GM: 17 g | NDC 00904693186

## 2024-08-25 MED ADMIN — Cholecalciferol Tab 25 MCG (1000 Unit): 1000 [IU] | NDC 2055503300

## 2024-08-25 MED ADMIN — Quetiapine Fumarate Tab 50 MG: 50 mg | NDC 67877024901

## 2024-08-25 MED ADMIN — Dexamethasone Sod Phosphate Preservative Free Inj 10 MG/ML: 20 mg | INTRAVENOUS | NDC 25021005301

## 2024-08-25 MED ADMIN — Pantoprazole Sodium For IV Soln 40 MG (Base Equiv): 40 mg | INTRAVENOUS | NDC 55150020200
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
## 2024-08-26 ENCOUNTER — Inpatient Hospital Stay (HOSPITAL_COMMUNITY): Admitting: Anesthesiology

## 2024-08-26 ENCOUNTER — Inpatient Hospital Stay (HOSPITAL_COMMUNITY)

## 2024-08-26 DIAGNOSIS — S31109A Unspecified open wound of abdominal wall, unspecified quadrant without penetration into peritoneal cavity, initial encounter: Secondary | ICD-10-CM

## 2024-08-26 DIAGNOSIS — S7291XA Unspecified fracture of right femur, initial encounter for closed fracture: Secondary | ICD-10-CM | POA: Diagnosis not present

## 2024-08-26 DIAGNOSIS — J15211 Pneumonia due to Methicillin susceptible Staphylococcus aureus: Secondary | ICD-10-CM

## 2024-08-26 DIAGNOSIS — J9601 Acute respiratory failure with hypoxia: Secondary | ICD-10-CM

## 2024-08-26 DIAGNOSIS — J189 Pneumonia, unspecified organism: Secondary | ICD-10-CM | POA: Diagnosis not present

## 2024-08-26 DIAGNOSIS — E870 Hyperosmolality and hypernatremia: Secondary | ICD-10-CM

## 2024-08-26 DIAGNOSIS — T07XXXA Unspecified multiple injuries, initial encounter: Secondary | ICD-10-CM | POA: Diagnosis not present

## 2024-08-26 DIAGNOSIS — J9602 Acute respiratory failure with hypercapnia: Secondary | ICD-10-CM

## 2024-08-26 DIAGNOSIS — J90 Pleural effusion, not elsewhere classified: Secondary | ICD-10-CM

## 2024-08-26 LAB — POCT I-STAT 7, (LYTES, BLD GAS, ICA,H+H)
Acid-Base Excess: 1 mmol/L (ref 0.0–2.0)
Acid-Base Excess: 3 mmol/L — ABNORMAL HIGH (ref 0.0–2.0)
Acid-Base Excess: 5 mmol/L — ABNORMAL HIGH (ref 0.0–2.0)
Bicarbonate: 28.5 mmol/L — ABNORMAL HIGH (ref 20.0–28.0)
Bicarbonate: 31.8 mmol/L — ABNORMAL HIGH (ref 20.0–28.0)
Bicarbonate: 32 mmol/L — ABNORMAL HIGH (ref 20.0–28.0)
Calcium, Ion: 1.17 mmol/L (ref 1.15–1.40)
Calcium, Ion: 1.14 mmol/L — ABNORMAL LOW (ref 1.15–1.40)
Calcium, Ion: 1.16 mmol/L (ref 1.15–1.40)
HCT: 33 % — ABNORMAL LOW (ref 39.0–52.0)
HCT: 38 % — ABNORMAL LOW (ref 39.0–52.0)
HCT: 43 % (ref 39.0–52.0)
Hemoglobin: 11.2 g/dL — ABNORMAL LOW (ref 13.0–17.0)
Hemoglobin: 12.9 g/dL — ABNORMAL LOW (ref 13.0–17.0)
Hemoglobin: 14.6 g/dL (ref 13.0–17.0)
O2 Saturation: 92 %
O2 Saturation: 55 %
O2 Saturation: 99 %
Patient temperature: 97.7
Patient temperature: 98.1
Potassium: 4.3 mmol/L (ref 3.5–5.1)
Potassium: 4 mmol/L (ref 3.5–5.1)
Potassium: 4.3 mmol/L (ref 3.5–5.1)
Sodium: 149 mmol/L — ABNORMAL HIGH (ref 135–145)
Sodium: 149 mmol/L — ABNORMAL HIGH (ref 135–145)
Sodium: 149 mmol/L — ABNORMAL HIGH (ref 135–145)
TCO2: 30 mmol/L (ref 22–32)
TCO2: 34 mmol/L — ABNORMAL HIGH (ref 22–32)
TCO2: 34 mmol/L — ABNORMAL HIGH (ref 22–32)
pCO2 arterial: 56.8 mmHg — ABNORMAL HIGH (ref 32–48)
pCO2 arterial: 55 mmHg — ABNORMAL HIGH (ref 32–48)
pCO2 arterial: 63.7 mmHg — ABNORMAL HIGH (ref 32–48)
pH, Arterial: 7.309 — ABNORMAL LOW (ref 7.35–7.45)
pH, Arterial: 7.304 — ABNORMAL LOW (ref 7.35–7.45)
pH, Arterial: 7.372 (ref 7.35–7.45)
pO2, Arterial: 73 mmHg — ABNORMAL LOW (ref 83–108)
pO2, Arterial: 167 mmHg — ABNORMAL HIGH (ref 83–108)
pO2, Arterial: 32 mmHg — CL (ref 83–108)

## 2024-08-26 LAB — BASIC METABOLIC PANEL WITH GFR
Anion gap: 7 (ref 5–15)
Anion gap: 9 (ref 5–15)
BUN: 25 mg/dL — ABNORMAL HIGH (ref 6–20)
BUN: 25 mg/dL — ABNORMAL HIGH (ref 6–20)
CO2: 28 mmol/L (ref 22–32)
CO2: 29 mmol/L (ref 22–32)
Calcium: 7.9 mg/dL — ABNORMAL LOW (ref 8.9–10.3)
Calcium: 8.3 mg/dL — ABNORMAL LOW (ref 8.9–10.3)
Chloride: 114 mmol/L — ABNORMAL HIGH (ref 98–111)
Chloride: 111 mmol/L (ref 98–111)
Creatinine, Ser: 0.74 mg/dL (ref 0.61–1.24)
Creatinine, Ser: 0.87 mg/dL (ref 0.61–1.24)
GFR, Estimated: >60 mL/min
GFR, Estimated: >60 mL/min
Glucose, Bld: 164 mg/dL — ABNORMAL HIGH (ref 70–99)
Glucose, Bld: 173 mg/dL — ABNORMAL HIGH (ref 70–99)
Potassium: 4.5 mmol/L (ref 3.5–5.1)
Potassium: 4.4 mmol/L (ref 3.5–5.1)
Sodium: 149 mmol/L — ABNORMAL HIGH (ref 135–145)
Sodium: 149 mmol/L — ABNORMAL HIGH (ref 135–145)

## 2024-08-26 LAB — CBC
HCT: 35.8 % — ABNORMAL LOW (ref 39.0–52.0)
Hemoglobin: 11.4 g/dL — ABNORMAL LOW (ref 13.0–17.0)
MCH: 28.9 pg (ref 26.0–34.0)
MCHC: 31.8 g/dL (ref 30.0–36.0)
MCV: 90.6 fL (ref 80.0–100.0)
Platelets: 314 10*3/uL (ref 150–400)
RBC: 3.95 MIL/uL — ABNORMAL LOW (ref 4.22–5.81)
RDW: 14.6 % (ref 11.5–15.5)
WBC: 14.9 10*3/uL — ABNORMAL HIGH (ref 4.0–10.5)
nRBC: 1.5 % — ABNORMAL HIGH (ref 0.0–0.2)

## 2024-08-26 LAB — GLUCOSE, CAPILLARY
Glucose-Capillary: 141 mg/dL — ABNORMAL HIGH (ref 70–99)
Glucose-Capillary: 123 mg/dL — ABNORMAL HIGH (ref 70–99)
Glucose-Capillary: 129 mg/dL — ABNORMAL HIGH (ref 70–99)
Glucose-Capillary: 155 mg/dL — ABNORMAL HIGH (ref 70–99)
Glucose-Capillary: 167 mg/dL — ABNORMAL HIGH (ref 70–99)
Glucose-Capillary: 171 mg/dL — ABNORMAL HIGH (ref 70–99)

## 2024-08-26 LAB — PHOSPHORUS: Phosphorus: 2.9 mg/dL (ref 2.5–4.6)

## 2024-08-26 LAB — CULTURE, RESPIRATORY W GRAM STAIN

## 2024-08-26 LAB — MAGNESIUM: Magnesium: 2.3 mg/dL (ref 1.7–2.4)

## 2024-08-26 LAB — MRSA NEXT GEN BY PCR, NASAL: MRSA by PCR Next Gen: NOT DETECTED

## 2024-08-26 MED ADMIN — Sennosides Tab 8.6 MG: 8.6 mg | NDC 00904725280

## 2024-08-26 MED ADMIN — Sennosides Tab 8.6 MG: 8.6 mg | NDC 00904725261

## 2024-08-26 MED ADMIN — Propofol IV Emul 1000 MG/100ML (10 MG/ML): 70 ug/kg/min | INTRAVENOUS | NDC 23155034533

## 2024-08-26 MED ADMIN — Propofol IV Emul 1000 MG/100ML (10 MG/ML): 70 ug/kg/min | INTRAVENOUS | NDC 00069024801

## 2024-08-26 MED ADMIN — Propofol IV Emul 1000 MG/100ML (10 MG/ML): 55 ug/kg/min | INTRAVENOUS | NDC 23155034533

## 2024-08-26 MED ADMIN — Propofol IV Emul 1000 MG/100ML (10 MG/ML): 65 ug/kg/min | INTRAVENOUS | NDC 23155034533

## 2024-08-26 MED ADMIN — ORAL CARE MOUTH RINSE: 15 mL | OROMUCOSAL | NDC 99999080097

## 2024-08-26 MED ADMIN — Fentanyl Citrate Preservative Free (PF) Inj 100 MCG/2ML: 300 ug/h | INTRAVENOUS | NDC 72572017201

## 2024-08-26 MED ADMIN — Docusate Sodium Liquid 150 MG/15ML: 100 mg | NDC 00904727966

## 2024-08-26 MED ADMIN — Insulin Aspart Inj Soln 100 Unit/ML: 2 [IU] | SUBCUTANEOUS | NDC 73070010011

## 2024-08-26 MED ADMIN — Insulin Aspart Inj Soln 100 Unit/ML: 1 [IU] | SUBCUTANEOUS | NDC 73070010011

## 2024-08-26 MED ADMIN — Acetaminophen Tab 500 MG: 1000 mg | NDC 50580045711

## 2024-08-26 MED ADMIN — Sodium Chloride Irrigation Soln 0.9%: 1000 mL | NDC 99999050048

## 2024-08-26 MED ADMIN — CEFEPIME 2 GM IVPB: 2 g | INTRAVENOUS | NDC 60505614704

## 2024-08-26 MED ADMIN — CEFEPIME 2 GM IVPB: 2 g | INTRAVENOUS | NDC 60505614700

## 2024-08-26 MED ADMIN — Enoxaparin Sodium Inj Soln Pref Syr 40 MG/0.4ML: 40 mg | SUBCUTANEOUS | NDC 71839011010

## 2024-08-26 MED ADMIN — Water For Irrigation, Sterile Irrigation Soln: 200 mL | NDC 99999080061

## 2024-08-26 MED ADMIN — Ipratropium-Albuterol Nebu Soln 0.5-2.5(3) MG/3ML: 3 mL | RESPIRATORY_TRACT | NDC 00487020101

## 2024-08-26 MED ADMIN — Polyethylene Glycol 3350 Oral Packet 17 GM: 17 g | NDC 00904693186

## 2024-08-26 MED ADMIN — Cholecalciferol Tab 25 MCG (1000 Unit): 1000 [IU] | NDC 2055503300

## 2024-08-26 MED ADMIN — Hydromorphone HCl Inj 1 MG/ML: .5 mg | INTRAVENOUS | NDC 76045000901

## 2024-08-26 MED ADMIN — Quetiapine Fumarate Tab 50 MG: 50 mg | NDC 50268063111

## 2024-08-26 MED ADMIN — Quetiapine Fumarate Tab 50 MG: 50 mg | NDC 67877024901

## 2024-08-26 MED ADMIN — Dexamethasone Sod Phosphate Preservative Free Inj 10 MG/ML: 10 mg | INTRAVENOUS | NDC 25021005301

## 2024-08-26 MED ADMIN — Fentanyl Citrate Preservative Free (PF) Inj 100 MCG/2ML: 50 ug | INTRAVENOUS | NDC 72572017001

## 2024-08-26 MED ADMIN — Pantoprazole Sodium For IV Soln 40 MG (Base Equiv): 40 mg | INTRAVENOUS | NDC 55150020200

## 2024-08-26 MED ADMIN — Furosemide Inj 10 MG/ML: 40 mg | INTRAVENOUS | NDC 36000028325

## 2024-08-26 MED ADMIN — Rocuronium Bromide IV Soln Pref Syr 100 MG/10ML (10 MG/ML): 20 mg | INTRAVENOUS | NDC 99999070048

## 2024-08-26 MED ADMIN — Rocuronium Bromide IV Soln Pref Syr 100 MG/10ML (10 MG/ML): 50 mg | INTRAVENOUS | NDC 99999070048

## 2024-08-26 MED ADMIN — Albuterol Sulfate Inhal Aero 108 MCG/ACT (90MCG Base Equiv): 5 | RESPIRATORY_TRACT | NDC 69097014260

## 2024-08-26 MED ADMIN — Furosemide Inj 10 MG/ML: 40 mg | INTRAMUSCULAR | NDC 00409610204

## 2024-08-26 MED FILL — Ipratropium-Albuterol Nebu Soln 0.5-2.5(3) MG/3ML: 3.0000 mL | RESPIRATORY_TRACT | Qty: 3 | Status: AC

## 2024-08-26 MED FILL — Furosemide Inj 10 MG/ML: 40.0000 mg | INTRAMUSCULAR | Qty: 4 | Status: AC

## 2024-08-26 MED FILL — Fentanyl Citrate Preservative Free (PF) Inj 100 MCG/2ML: INTRAMUSCULAR | Qty: 2 | Status: AC

## 2024-08-26 MED FILL — Hydromorphone HCl Inj 1 MG/ML: INTRAMUSCULAR | Qty: 0.5 | Status: AC

## 2024-08-26 MED FILL — Furosemide Inj 10 MG/ML: INTRAMUSCULAR | Qty: 4 | Status: AC

## 2024-08-26 NOTE — Transfer of Care (Signed)
 Immediate Anesthesia Transfer of Care Note  Patient: Justin Chaney  Procedure(s) Performed: LAPAROTOMY, EXPLORATORY washout and closure  Patient Location: PACU  Anesthesia Type:General  Level of Consciousness: Patient remains intubated per anesthesia plan  Airway & Oxygen Therapy: Patient remains intubated per anesthesia plan and Patient placed on Ventilator (see vital sign flow sheet for setting)  Post-op Assessment: Report given to RN and Post -op Vital signs reviewed and stable  Post vital signs: Reviewed and stable  Last Vitals:  Vitals Value Taken Time  BP    Temp    Pulse 109 08/26/24 09:17  Resp 13 08/26/24 09:17  SpO2 93 % 08/26/24 09:17  Vitals shown include unfiled device data.  Last Pain:  Vitals:   08/26/24 0400  TempSrc: Axillary  PainSc:          Complications: No notable events documented.
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
## 2024-08-26 NOTE — Op Note (Signed)
 08/26/2024  9:15 AM  PATIENT:  Justin Chaney  29 y.o. male  Patient Care Team: Pcp, No as PCP - General  PRE-OPERATIVE DIAGNOSIS:  Open abdomen  POST-OPERATIVE DIAGNOSIS:  Same  PROCEDURE:   Exploratory laparotomy with abdominal washout and closure Negative pressure wound therapy (vacuum-assisted drainage collection), utilizing nondisposable durable medical equipment (DME) on an open wound, including topical application, wound assessment, and instructions for ongoing care; total wound surface area greater than 50 cm   SURGEON:  Lonni HERO. Meet Weathington, MD  ASSISTANT: OR Staff  ANESTHESIA:   general  COUNTS:  Sponge, needle and instrument counts were reported correct x2 at the conclusion of the operation.  EBL: 5 mL  DRAINS: none  SPECIMEN: None  COMPLICATIONS: none  FINDINGS: No significant distention of the bowel.  All visible bowel is viable and healthy.  No succus or other concerns noted on laparotomy.  We are able to bring the fascia to get without any significant tension or alteration to his respiratory mechanics.  The abdomen is able to be successfully closed.  Internal tension sutures were placed.  DISPOSITION: ICU in critical but stable condition  DESCRIPTION:  The patient was seen in the pre-op holding area. The risks, benefits, complications, treatment options, and expected outcomes were previously discussed with the patient. The patient agreed with the proposed plan and has signed the informed consent form. The patient was brought to the operating room by the surgical team, identified as Justin Chaney, and the procedure verified. He was positioned supine on the operating table and SCD's were in place. General anesthesia was induced without difficulty. He was positioned supine.  Pressure points were evaluated and padded. He was secured to the operating table. The abdomen was then prepped and draped in the standard sterile fashion. A time out was completed and the  above information confirmed and need for preoperative antibiotics.  The wound VAC sponge is removed.  The abdomen is then assessed.  Peritoneal fluid is serosanguineous and not foul-smelling.  Small bowel that is visible from the wound was evaluated and healthy in appearance.  We did encounter some surgical sutures.  We were unable to fully eviscerate the small bowel as he is fairly socked in at this juncture.  That said, all 4 quadrants are carefully inspected and irrigated with warm saline.  No succus or foul-smelling material was encountered.  The fascia is almost closed at baseline.  There are pre-existing PDS sutures already in place from Dr. Jomarie partial closure.  The external retention sutures have been cut apparently during the initial VAC removal as he was being prepared for surgery.  These were therefore too short to be tied and removed.  No foreign bodies were evident within the abdominal cavity.  We were able to bring the fascial edges together without any significant tension.  We therefore plan to proceed with closure.  We did discuss the anesthesia first and peak pressures remain unchanged with fascia on traction.  Internal retentions of #1 Novafil were placed in the inferior aspect x 2.  #1 looped PDS suture was then used running from the top to the bottom.  We used interrupted #1 Novafil internal retention sutures along the way.  The PDS was run down to and just beyond the prior PDS suture that was placed by Dr. Paola.  These were then able to be tied together.  All sponge, needle, and instrument counts were reported correct.  The interrupted Novafil internal retention sutures were then all sequentially tied  as well.  The fascia is palpated to be completely closed without any gaps or separation.  We then turned our attention to placement of a wound VAC over the open wound.  All final counts were reported correct.  A negative pressure wound VAC (Vacuum Assisted drainage Collection device)  using nondisposable durable medical equipment (DME) was placed onto this open wound with a total wound surface area of (25 x 5 cm).  This was then placed to suction.  There is a good seal.  He has subsequently transferred to a stretcher for transport back to the intensive care unit in critical but stable condition.  I was able to update his legal guardian via the phone.  This morning, his mother does apparently have her cell phone off as a go straight to voicemail.  I did leave a message at least updating her that the procedure went fine.  Critical care medicine has been consulted to assist in ventilatory management.

## 2024-08-26 NOTE — Anesthesia Postprocedure Evaluation (Signed)
"   Anesthesia Post Note  Patient: Justin Chaney  Procedure(s) Performed: LAPAROTOMY, EXPLORATORY washout and closure     Patient location during evaluation: ICU Anesthesia Type: General Level of consciousness: patient remains intubated per anesthesia plan Pain management: pain level controlled Vital Signs Assessment: post-procedure vital signs reviewed and stable Respiratory status: patient remains intubated per anesthesia plan Cardiovascular status: stable Anesthetic complications: no   No notable events documented.            Lauraine DASEN Colhoun      "
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
## 2024-08-27 ENCOUNTER — Other Ambulatory Visit: Payer: Self-pay

## 2024-08-27 ENCOUNTER — Encounter (HOSPITAL_COMMUNITY): Payer: Self-pay | Admitting: Surgery

## 2024-08-27 DIAGNOSIS — S7291XA Unspecified fracture of right femur, initial encounter for closed fracture: Secondary | ICD-10-CM | POA: Diagnosis not present

## 2024-08-27 DIAGNOSIS — J9602 Acute respiratory failure with hypercapnia: Secondary | ICD-10-CM | POA: Diagnosis not present

## 2024-08-27 DIAGNOSIS — T07XXXA Unspecified multiple injuries, initial encounter: Secondary | ICD-10-CM | POA: Diagnosis not present

## 2024-08-27 DIAGNOSIS — J9601 Acute respiratory failure with hypoxia: Secondary | ICD-10-CM | POA: Diagnosis not present

## 2024-08-27 LAB — EXPECTORATED SPUTUM ASSESSMENT W GRAM STAIN, RFLX TO RESP C

## 2024-08-27 LAB — GLUCOSE, CAPILLARY
Glucose-Capillary: 165 mg/dL — ABNORMAL HIGH (ref 70–99)
Glucose-Capillary: 130 mg/dL — ABNORMAL HIGH (ref 70–99)
Glucose-Capillary: 142 mg/dL — ABNORMAL HIGH (ref 70–99)
Glucose-Capillary: 152 mg/dL — ABNORMAL HIGH (ref 70–99)
Glucose-Capillary: 225 mg/dL — ABNORMAL HIGH (ref 70–99)

## 2024-08-27 LAB — POCT I-STAT 7, (LYTES, BLD GAS, ICA,H+H)
Acid-Base Excess: 8 mmol/L — ABNORMAL HIGH (ref 0.0–2.0)
Bicarbonate: 32.5 mmol/L — ABNORMAL HIGH (ref 20.0–28.0)
Calcium, Ion: 1.11 mmol/L — ABNORMAL LOW (ref 1.15–1.40)
HCT: 37 % — ABNORMAL LOW (ref 39.0–52.0)
Hemoglobin: 12.6 g/dL — ABNORMAL LOW (ref 13.0–17.0)
O2 Saturation: 96 %
Patient temperature: 102.4
Potassium: 4 mmol/L (ref 3.5–5.1)
Sodium: 149 mmol/L — ABNORMAL HIGH (ref 135–145)
TCO2: 34 mmol/L — ABNORMAL HIGH (ref 22–32)
pCO2 arterial: 50 mmHg — ABNORMAL HIGH (ref 32–48)
pH, Arterial: 7.429 (ref 7.35–7.45)
pO2, Arterial: 87 mmHg (ref 83–108)

## 2024-08-27 LAB — COMPREHENSIVE METABOLIC PANEL WITH GFR
ALT: 40 U/L (ref 0–44)
AST: 53 U/L — ABNORMAL HIGH (ref 15–41)
Albumin: 3 g/dL — ABNORMAL LOW (ref 3.5–5.0)
Alkaline Phosphatase: 78 U/L (ref 38–126)
Anion gap: 10 (ref 5–15)
BUN: 30 mg/dL — ABNORMAL HIGH (ref 6–20)
CO2: 30 mmol/L (ref 22–32)
Calcium: 8.4 mg/dL — ABNORMAL LOW (ref 8.9–10.3)
Chloride: 108 mmol/L (ref 98–111)
Creatinine, Ser: 0.91 mg/dL (ref 0.61–1.24)
GFR, Estimated: >60 mL/min
Glucose, Bld: 152 mg/dL — ABNORMAL HIGH (ref 70–99)
Potassium: 4 mmol/L (ref 3.5–5.1)
Sodium: 148 mmol/L — ABNORMAL HIGH (ref 135–145)
Total Bilirubin: 1.7 mg/dL — ABNORMAL HIGH (ref 0.0–1.2)
Total Protein: 6.6 g/dL (ref 6.5–8.1)

## 2024-08-27 LAB — CBC
HCT: 38.3 % — ABNORMAL LOW (ref 39.0–52.0)
Hemoglobin: 12.1 g/dL — ABNORMAL LOW (ref 13.0–17.0)
MCH: 28.7 pg (ref 26.0–34.0)
MCHC: 31.6 g/dL (ref 30.0–36.0)
MCV: 91 fL (ref 80.0–100.0)
Platelets: 379 10*3/uL (ref 150–400)
RBC: 4.21 MIL/uL — ABNORMAL LOW (ref 4.22–5.81)
RDW: 14.9 % (ref 11.5–15.5)
WBC: 17.1 10*3/uL — ABNORMAL HIGH (ref 4.0–10.5)
nRBC: 1.5 % — ABNORMAL HIGH (ref 0.0–0.2)

## 2024-08-27 LAB — TRIGLYCERIDES
Triglycerides: 517 mg/dL — ABNORMAL HIGH
Triglycerides: 450 mg/dL — ABNORMAL HIGH

## 2024-08-27 LAB — MAGNESIUM: Magnesium: 2.5 mg/dL — ABNORMAL HIGH (ref 1.7–2.4)

## 2024-08-27 LAB — PHOSPHORUS: Phosphorus: 2.4 mg/dL — ABNORMAL LOW (ref 2.5–4.6)

## 2024-08-27 MED ORDER — SODIUM CHLORIDE 0.9 % IV SOLN
2.0000 g | Freq: Three times a day (TID) | INTRAVENOUS | Status: DC
  Administered 2024-08-27 – 2024-08-30 (×9): 2 g via INTRAVENOUS
  Filled 2024-08-27 (×9): qty 12.5

## 2024-08-27 MED ADMIN — Sennosides Tab 8.6 MG: 8.6 mg | NDC 00904725280

## 2024-08-27 MED ADMIN — Sennosides Tab 8.6 MG: 8.6 mg | NDC 00904725261

## 2024-08-27 MED ADMIN — Propofol IV Emul 1000 MG/100ML (10 MG/ML): 40 ug/kg/min | INTRAVENOUS | NDC 23155034533

## 2024-08-27 MED ADMIN — ORAL CARE MOUTH RINSE: 15 mL | OROMUCOSAL | NDC 99999080097

## 2024-08-27 MED ADMIN — Fentanyl Citrate Preservative Free (PF) Inj 100 MCG/2ML: 200 ug/h | INTRAVENOUS | NDC 72572017201

## 2024-08-27 MED ADMIN — Fentanyl Citrate Preservative Free (PF) Inj 100 MCG/2ML: 300 ug/h | INTRAVENOUS | NDC 72572017201

## 2024-08-27 MED ADMIN — Docusate Sodium Liquid 150 MG/15ML: 100 mg | NDC 00904727966

## 2024-08-27 MED ADMIN — Insulin Aspart Inj Soln 100 Unit/ML: 2 [IU] | SUBCUTANEOUS | NDC 73070010011

## 2024-08-27 MED ADMIN — Insulin Aspart Inj Soln 100 Unit/ML: 1 [IU] | SUBCUTANEOUS | NDC 73070010011

## 2024-08-27 MED ADMIN — Acetaminophen Tab 500 MG: 1000 mg | NDC 50580045711

## 2024-08-27 MED ADMIN — CEFEPIME 2 GM IVPB: 2 g | INTRAVENOUS | NDC 60505614700

## 2024-08-27 MED ADMIN — Enoxaparin Sodium Inj Soln Pref Syr 40 MG/0.4ML: 40 mg | SUBCUTANEOUS | NDC 00781324602

## 2024-08-27 MED ADMIN — Enoxaparin Sodium Inj Soln Pref Syr 40 MG/0.4ML: 40 mg | SUBCUTANEOUS | NDC 71839011010

## 2024-08-27 MED ADMIN — Dexmedetomidine HCl in NaCl 0.9% IV Soln 400 MCG/100ML: 1.2 ug/kg/h | INTRAVENOUS | NDC 00338955712

## 2024-08-27 MED ADMIN — Dexmedetomidine HCl in NaCl 0.9% IV Soln 400 MCG/100ML: 0.4 ug/kg/h | INTRAVENOUS | NDC 00338955712

## 2024-08-27 MED ADMIN — Chlorhexidine Gluconate Pads 2%: 6 | TOPICAL | NDC 53462070523

## 2024-08-27 MED ADMIN — Water For Irrigation, Sterile Irrigation Soln: 200 mL | NDC 99999080061

## 2024-08-27 MED ADMIN — Metronidazole IV Soln 500 MG/100ML: 500 mg | INTRAVENOUS | NDC 00338105548

## 2024-08-27 MED ADMIN — Ipratropium-Albuterol Nebu Soln 0.5-2.5(3) MG/3ML: 3 mL | RESPIRATORY_TRACT | NDC 00487020101

## 2024-08-27 MED ADMIN — Polyethylene Glycol 3350 Oral Packet 17 GM: 17 g | NDC 00904693186

## 2024-08-27 MED ADMIN — Cholecalciferol Tab 25 MCG (1000 Unit): 1000 [IU] | NDC 2055503300

## 2024-08-27 MED ADMIN — Dexamethasone Sod Phosphate Preservative Free Inj 10 MG/ML: 10 mg | INTRAVENOUS | NDC 25021005301

## 2024-08-27 MED ADMIN — Insulin Aspart Inj Soln 100 Unit/ML: 5 [IU] | SUBCUTANEOUS | NDC 73070010011

## 2024-08-27 MED ADMIN — Pantoprazole Sodium For IV Soln 40 MG (Base Equiv): 40 mg | INTRAVENOUS | NDC 55150020200

## 2024-08-27 MED ADMIN — Quetiapine Fumarate Tab 100 MG: 100 mg | NDC 29300014901

## 2024-08-27 MED ADMIN — Furosemide Inj 10 MG/ML: 40 mg | INTRAVENOUS | NDC 36000028325

## 2024-08-27 MED FILL — Quetiapine Fumarate Tab 100 MG: 100.0000 mg | ORAL | Qty: 1 | Status: AC

## 2024-08-27 MED FILL — Dexmedetomidine HCl in NaCl 0.9% IV Soln 400 MCG/100ML: 0.0000 ug/kg/h | INTRAVENOUS | Qty: 200 | Status: AC

## 2024-08-27 MED FILL — Dexmedetomidine HCl in NaCl 0.9% IV Soln 400 MCG/100ML: 0.0000 ug/kg/h | INTRAVENOUS | Qty: 100 | Status: AC

## 2024-08-27 MED FILL — Insulin Aspart Inj Soln 100 Unit/ML: 0.0000 [IU] | INTRAMUSCULAR | Qty: 3 | Status: AC

## 2024-08-27 MED FILL — Insulin Aspart Inj Soln 100 Unit/ML: 0.0000 [IU] | INTRAMUSCULAR | Qty: 5 | Status: AC

## 2024-08-27 MED FILL — Furosemide Inj 10 MG/ML: 40.0000 mg | INTRAMUSCULAR | Qty: 4 | Status: AC

## 2024-08-27 MED FILL — Metronidazole IV Soln 500 MG/100ML: 500.0000 mg | INTRAVENOUS | Qty: 100 | Status: AC

## 2024-08-27 MED FILL — Amino Acid Infusion 10%: INTRAVENOUS | Qty: 410.4 | Status: AC
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
## 2024-08-27 NOTE — TOC Progression Note (Signed)
 Transition of Care [facility]) - Progression Note    Patient Details  Name: Urie Loughner MRN: [id number] Date of Birth: 1995/09/21  [facility] CM/SW Contact  Katya Rolston M, RN Phone Number: 08/27/2024, 1:49 PM  Clinical Narrative:    Patient remains intubated and weaning well; hope to extubate soon, per MD.  Starting TNA today for prolonged ileus.  Wound closed with VAC in place.   Inpatient Care Management will continue to follow as patient progresses.      Barriers to Discharge: Continued Medical Work up               Expected Discharge Plan and Services   Discharge Planning Services: CM Consult   Living arrangements for the past 2 months: Homeless                                       Social Drivers of Health (SDOH) Interventions SDOH Screenings   Food Insecurity: Patient Unable To Answer (08/19/2024)    Readmission Risk Interventions     No data to display         Mliss MICAEL Fass, RN, BSN  Trauma/Neuro ICU Case Manager [phone]

## 2024-08-27 NOTE — Progress Notes (Signed)
 RT NOTE: patient placed on CPAP/PSV of 10/8 at 0752.  Tolerating well at this time.  Will continue to monitor and wean as tolerated.
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
## 2024-08-28 DIAGNOSIS — J9601 Acute respiratory failure with hypoxia: Secondary | ICD-10-CM | POA: Diagnosis not present

## 2024-08-28 DIAGNOSIS — J15211 Pneumonia due to Methicillin susceptible Staphylococcus aureus: Secondary | ICD-10-CM | POA: Diagnosis not present

## 2024-08-28 DIAGNOSIS — J9602 Acute respiratory failure with hypercapnia: Secondary | ICD-10-CM | POA: Diagnosis not present

## 2024-08-28 DIAGNOSIS — A4101 Sepsis due to Methicillin susceptible Staphylococcus aureus: Secondary | ICD-10-CM

## 2024-08-28 DIAGNOSIS — S066XAA Traumatic subarachnoid hemorrhage with loss of consciousness status unknown, initial encounter: Secondary | ICD-10-CM | POA: Diagnosis not present

## 2024-08-28 DIAGNOSIS — S065XAA Traumatic subdural hemorrhage with loss of consciousness status unknown, initial encounter: Secondary | ICD-10-CM

## 2024-08-28 DIAGNOSIS — K661 Hemoperitoneum: Secondary | ICD-10-CM

## 2024-08-28 DIAGNOSIS — E871 Hypo-osmolality and hyponatremia: Secondary | ICD-10-CM | POA: Diagnosis not present

## 2024-08-28 DIAGNOSIS — J90 Pleural effusion, not elsewhere classified: Secondary | ICD-10-CM | POA: Diagnosis not present

## 2024-08-28 DIAGNOSIS — S7291XA Unspecified fracture of right femur, initial encounter for closed fracture: Secondary | ICD-10-CM | POA: Diagnosis not present

## 2024-08-28 LAB — COMPREHENSIVE METABOLIC PANEL WITH GFR
ALT: 34 U/L (ref 0–44)
AST: 51 U/L — ABNORMAL HIGH (ref 15–41)
Albumin: 2.9 g/dL — ABNORMAL LOW (ref 3.5–5.0)
Alkaline Phosphatase: 73 U/L (ref 38–126)
Anion gap: 8 (ref 5–15)
BUN: 34 mg/dL — ABNORMAL HIGH (ref 6–20)
CO2: 28 mmol/L (ref 22–32)
Calcium: 8.5 mg/dL — ABNORMAL LOW (ref 8.9–10.3)
Chloride: 113 mmol/L — ABNORMAL HIGH (ref 98–111)
Creatinine, Ser: 0.74 mg/dL (ref 0.61–1.24)
GFR, Estimated: >60 mL/min
Glucose, Bld: 209 mg/dL — ABNORMAL HIGH (ref 70–99)
Potassium: 4.3 mmol/L (ref 3.5–5.1)
Sodium: 150 mmol/L — ABNORMAL HIGH (ref 135–145)
Total Bilirubin: 1.3 mg/dL — ABNORMAL HIGH (ref 0.0–1.2)
Total Protein: 6.5 g/dL (ref 6.5–8.1)

## 2024-08-28 LAB — CBC
HCT: 34.4 % — ABNORMAL LOW (ref 39.0–52.0)
Hemoglobin: 10.9 g/dL — ABNORMAL LOW (ref 13.0–17.0)
MCH: 28.8 pg (ref 26.0–34.0)
MCHC: 31.7 g/dL (ref 30.0–36.0)
MCV: 91 fL (ref 80.0–100.0)
Platelets: 379 10*3/uL (ref 150–400)
RBC: 3.78 MIL/uL — ABNORMAL LOW (ref 4.22–5.81)
RDW: 14.9 % (ref 11.5–15.5)
WBC: 15.2 10*3/uL — ABNORMAL HIGH (ref 4.0–10.5)
nRBC: 0.5 % — ABNORMAL HIGH (ref 0.0–0.2)

## 2024-08-28 LAB — GLUCOSE, CAPILLARY
Glucose-Capillary: 201 mg/dL — ABNORMAL HIGH (ref 70–99)
Glucose-Capillary: 200 mg/dL — ABNORMAL HIGH (ref 70–99)
Glucose-Capillary: 203 mg/dL — ABNORMAL HIGH (ref 70–99)
Glucose-Capillary: 193 mg/dL — ABNORMAL HIGH (ref 70–99)
Glucose-Capillary: 209 mg/dL — ABNORMAL HIGH (ref 70–99)
Glucose-Capillary: 240 mg/dL — ABNORMAL HIGH (ref 70–99)
Glucose-Capillary: 242 mg/dL — ABNORMAL HIGH (ref 70–99)

## 2024-08-28 LAB — MAGNESIUM: Magnesium: 2.8 mg/dL — ABNORMAL HIGH (ref 1.7–2.4)

## 2024-08-28 LAB — TRIGLYCERIDES: Triglycerides: 162 mg/dL — ABNORMAL HIGH

## 2024-08-28 LAB — PHOSPHORUS: Phosphorus: 2.3 mg/dL — ABNORMAL LOW (ref 2.5–4.6)

## 2024-08-28 MED ORDER — SODIUM PHOSPHATES 45 MMOLE/15ML IV SOLN
15.0000 mmol | Freq: Once | INTRAVENOUS | Status: AC
  Administered 2024-08-28: 15 mmol via INTRAVENOUS
  Filled 2024-08-28: qty 5

## 2024-08-28 MED ADMIN — Sennosides Tab 8.6 MG: 8.6 mg | NDC 00904725261

## 2024-08-28 MED ADMIN — Fentanyl Citrate Preservative Free (PF) Inj 100 MCG/2ML: 100 ug | INTRAVENOUS

## 2024-08-28 MED ADMIN — ORAL CARE MOUTH RINSE: 15 mL | OROMUCOSAL | NDC 99999080097

## 2024-08-28 MED ADMIN — Docusate Sodium Liquid 150 MG/15ML: 100 mg | NDC 00904727966

## 2024-08-28 MED ADMIN — Water For Irrigation, Sterile Irrigation Soln: 300 mL | NDC 99999080061

## 2024-08-28 MED ADMIN — Insulin Aspart Inj Soln 100 Unit/ML: 4 [IU] | SUBCUTANEOUS | NDC 00169750111

## 2024-08-28 MED ADMIN — Insulin Aspart Inj Soln 100 Unit/ML: 7 [IU] | SUBCUTANEOUS | NDC 00169750111

## 2024-08-28 MED ADMIN — Insulin Aspart Inj Soln 100 Unit/ML: 7 [IU] | SUBCUTANEOUS | NDC 73070010011

## 2024-08-28 MED ADMIN — Acetaminophen Tab 500 MG: 1000 mg | NDC 50580045711

## 2024-08-28 MED ADMIN — Enoxaparin Sodium Inj Soln Pref Syr 40 MG/0.4ML: 40 mg | SUBCUTANEOUS | NDC 00781324602

## 2024-08-28 MED ADMIN — Dexmedetomidine HCl in NaCl 0.9% IV Soln 400 MCG/100ML: 1.2 ug/kg/h | INTRAVENOUS | NDC 00338955712

## 2024-08-28 MED ADMIN — Dexmedetomidine HCl in NaCl 0.9% IV Soln 400 MCG/100ML: 0.4 ug/kg/h | INTRAVENOUS | NDC 00338955712

## 2024-08-28 MED ADMIN — Dexmedetomidine HCl in NaCl 0.9% IV Soln 400 MCG/100ML: 0.9 ug/kg/h | INTRAVENOUS | NDC 00338955712

## 2024-08-28 MED ADMIN — Chlorhexidine Gluconate Pads 2%: 6 | TOPICAL | NDC 53462070523

## 2024-08-28 MED ADMIN — Water For Irrigation, Sterile Irrigation Soln: 200 mL | NDC 99999080061

## 2024-08-28 MED ADMIN — Ipratropium-Albuterol Nebu Soln 0.5-2.5(3) MG/3ML: 3 mL | RESPIRATORY_TRACT | NDC 00487020101

## 2024-08-28 MED ADMIN — Polyethylene Glycol 3350 Oral Packet 17 GM: 17 g | NDC 00904693186

## 2024-08-28 MED ADMIN — Cholecalciferol Tab 25 MCG (1000 Unit): 1000 [IU] | NDC 2055503300

## 2024-08-28 MED ADMIN — Dexamethasone Sod Phosphate Preservative Free Inj 10 MG/ML: 10 mg | INTRAVENOUS | NDC 25021005301

## 2024-08-28 MED ADMIN — Insulin Aspart Inj Soln 100 Unit/ML: 3 [IU] | SUBCUTANEOUS | NDC 73070010011

## 2024-08-28 MED ADMIN — Insulin Aspart Inj Soln 100 Unit/ML: 5 [IU] | SUBCUTANEOUS | NDC 73070010011

## 2024-08-28 MED ADMIN — Pantoprazole Sodium For IV Soln 40 MG (Base Equiv): 40 mg | INTRAVENOUS | NDC 55150020200

## 2024-08-28 MED ADMIN — Furosemide Inj 10 MG/ML: 40 mg | INTRAVENOUS | NDC 36000028325

## 2024-08-28 MED ADMIN — Quetiapine Fumarate Tab 100 MG: 100 mg | NDC 29300014901

## 2024-08-28 MED FILL — Insulin Aspart Inj Soln 100 Unit/ML: 0.0000 [IU] | INTRAMUSCULAR | Qty: 7 | Status: AC

## 2024-08-28 MED FILL — Insulin Aspart Inj Soln 100 Unit/ML: 0.0000 [IU] | INTRAMUSCULAR | Qty: 11 | Status: AC

## 2024-08-28 MED FILL — Insulin Aspart Inj Soln 100 Unit/ML: 0.0000 [IU] | INTRAMUSCULAR | Qty: 5 | Status: AC

## 2024-08-28 MED FILL — Furosemide Inj 10 MG/ML: 40.0000 mg | INTRAMUSCULAR | Qty: 4 | Status: AC

## 2024-08-28 MED FILL — Amino Acid Infusion 10%: INTRAVENOUS | Qty: 685.2 | Status: AC

## 2024-08-28 NOTE — Consult Note (Addendum)
" ° °  CLINICAL SUPPORT TEAM - WOUND OSTOMY AND CONTINENCE TEAM  CONSULTATION SERVICES   WOC Nurse-Inpatient Note  WOC consult requested to perform first post-op Vac change tomorrow; will perform as requested. Supplies order to the room.  Thank-you,  Stephane Fought MSN, RN, CWOCN, CWCN-AP, CNS Contact Mon-Fri 0700-1500: [phone]      "
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
## 2024-08-28 NOTE — Plan of Care (Signed)
  Problem: Education: Goal: Knowledge of General Education information will improve Description: Including pain rating scale, medication(s)/side effects and non-pharmacologic comfort measures Outcome: Not Progressing   Problem: Health Behavior/Discharge Planning: Goal: Ability to manage health-related needs will improve Outcome: Not Progressing   Problem: Clinical Measurements: Goal: Ability to maintain clinical measurements within normal limits will improve Outcome: Not Progressing Goal: Will remain free from infection Outcome: Not Progressing Goal: Diagnostic test results will improve Outcome: Not Progressing Goal: Respiratory complications will improve Outcome: Not Progressing Goal: Cardiovascular complication will be avoided Outcome: Not Progressing   Problem: Activity: Goal: Risk for activity intolerance will decrease Outcome: Not Progressing   Problem: Nutrition: Goal: Adequate nutrition will be maintained Outcome: Not Progressing   Problem: Coping: Goal: Level of anxiety will decrease Outcome: Not Progressing   Problem: Elimination: Goal: Will not experience complications related to bowel motility Outcome: Not Progressing Goal: Will not experience complications related to urinary retention Outcome: Not Progressing   Problem: Pain Managment: Goal: General experience of comfort will improve and/or be controlled Outcome: Not Progressing   Problem: Safety: Goal: Ability to remain free from injury will improve Outcome: Not Progressing   Problem: Skin Integrity: Goal: Risk for impaired skin integrity will decrease Outcome: Not Progressing   Problem: Safety: Goal: Non-violent Restraint(s) Outcome: Not Progressing   Problem: Education: Goal: Ability to describe self-care measures that may prevent or decrease complications (Diabetes Survival Skills Education) will improve Outcome: Not Progressing Goal: Individualized Educational Video(s) Outcome: Not  Progressing   Problem: Coping: Goal: Ability to adjust to condition or change in health will improve Outcome: Not Progressing   Problem: Fluid Volume: Goal: Ability to maintain a balanced intake and output will improve Outcome: Not Progressing   Problem: Health Behavior/Discharge Planning: Goal: Ability to identify and utilize available resources and services will improve Outcome: Not Progressing Goal: Ability to manage health-related needs will improve Outcome: Not Progressing   Problem: Metabolic: Goal: Ability to maintain appropriate glucose levels will improve Outcome: Not Progressing   Problem: Nutritional: Goal: Maintenance of adequate nutrition will improve Outcome: Not Progressing Goal: Progress toward achieving an optimal weight will improve Outcome: Not Progressing   Problem: Skin Integrity: Goal: Risk for impaired skin integrity will decrease Outcome: Not Progressing   Problem: Tissue Perfusion: Goal: Adequacy of tissue perfusion will improve Outcome: Not Progressing
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
## 2024-08-29 ENCOUNTER — Inpatient Hospital Stay (HOSPITAL_COMMUNITY): Admitting: Certified Registered Nurse Anesthetist

## 2024-08-29 ENCOUNTER — Encounter (HOSPITAL_COMMUNITY): Payer: Self-pay | Admitting: Surgery

## 2024-08-29 LAB — COMPREHENSIVE METABOLIC PANEL WITH GFR
ALT: 38 U/L (ref 0–44)
AST: 50 U/L — ABNORMAL HIGH (ref 15–41)
Albumin: 2.7 g/dL — ABNORMAL LOW (ref 3.5–5.0)
Alkaline Phosphatase: 70 U/L (ref 38–126)
Anion gap: 9 (ref 5–15)
BUN: 31 mg/dL — ABNORMAL HIGH (ref 6–20)
CO2: 27 mmol/L (ref 22–32)
Calcium: 8.2 mg/dL — ABNORMAL LOW (ref 8.9–10.3)
Chloride: 111 mmol/L (ref 98–111)
Creatinine, Ser: 0.64 mg/dL (ref 0.61–1.24)
GFR, Estimated: >60 mL/min
Glucose, Bld: 248 mg/dL — ABNORMAL HIGH (ref 70–99)
Potassium: 4.4 mmol/L (ref 3.5–5.1)
Sodium: 147 mmol/L — ABNORMAL HIGH (ref 135–145)
Total Bilirubin: 1.6 mg/dL — ABNORMAL HIGH (ref 0.0–1.2)
Total Protein: 6.2 g/dL — ABNORMAL LOW (ref 6.5–8.1)

## 2024-08-29 LAB — CBC
HCT: 35.7 % — ABNORMAL LOW (ref 39.0–52.0)
Hemoglobin: 11 g/dL — ABNORMAL LOW (ref 13.0–17.0)
MCH: 28.1 pg (ref 26.0–34.0)
MCHC: 30.8 g/dL (ref 30.0–36.0)
MCV: 91.1 fL (ref 80.0–100.0)
Platelets: 283 10*3/uL (ref 150–400)
RBC: 3.92 MIL/uL — ABNORMAL LOW (ref 4.22–5.81)
RDW: 15.1 % (ref 11.5–15.5)
WBC: 18 10*3/uL — ABNORMAL HIGH (ref 4.0–10.5)
nRBC: 0.2 % (ref 0.0–0.2)

## 2024-08-29 LAB — MAGNESIUM: Magnesium: 2.3 mg/dL (ref 1.7–2.4)

## 2024-08-29 LAB — GLUCOSE, CAPILLARY
Glucose-Capillary: 242 mg/dL — ABNORMAL HIGH (ref 70–99)
Glucose-Capillary: 228 mg/dL — ABNORMAL HIGH (ref 70–99)
Glucose-Capillary: 229 mg/dL — ABNORMAL HIGH (ref 70–99)
Glucose-Capillary: 251 mg/dL — ABNORMAL HIGH (ref 70–99)
Glucose-Capillary: 225 mg/dL — ABNORMAL HIGH (ref 70–99)

## 2024-08-29 LAB — PHOSPHORUS: Phosphorus: 2.8 mg/dL (ref 2.5–4.6)

## 2024-08-29 LAB — CULTURE, RESPIRATORY W GRAM STAIN

## 2024-08-29 LAB — TRIGLYCERIDES: Triglycerides: 139 mg/dL

## 2024-08-29 MED ORDER — DIAZEPAM 5 MG PO TABS
5.0000 mg | ORAL_TABLET | Freq: Four times a day (QID) | ORAL | Status: DC
  Administered 2024-08-29 – 2024-08-30 (×4): 5 mg
  Filled 2024-08-29 (×4): qty 1

## 2024-08-29 MED ORDER — ROCURONIUM BROMIDE 10 MG/ML (PF) SYRINGE
100.0000 mg | PREFILLED_SYRINGE | INTRAVENOUS | Status: DC | PRN
  Administered 2024-08-29: 100 mg via INTRAVENOUS
  Filled 2024-08-29: qty 10

## 2024-08-29 MED ADMIN — Sennosides Tab 8.6 MG: 8.6 mg | NDC 00904725280

## 2024-08-29 MED ADMIN — Fentanyl Citrate Preservative Free (PF) Inj 100 MCG/2ML: 100 ug | INTRAVENOUS

## 2024-08-29 MED ADMIN — ORAL CARE MOUTH RINSE: 15 mL | OROMUCOSAL | NDC 99999080097

## 2024-08-29 MED ADMIN — Fentanyl Citrate Preservative Free (PF) Inj 100 MCG/2ML: 400 ug/h | INTRAVENOUS | NDC 72572017201

## 2024-08-29 MED ADMIN — Fentanyl Citrate Preservative Free (PF) Inj 100 MCG/2ML: 200 ug/h | INTRAVENOUS | NDC 72572017201

## 2024-08-29 MED ADMIN — Docusate Sodium Liquid 150 MG/15ML: 100 mg | NDC 00121187010

## 2024-08-29 MED ADMIN — Water For Irrigation, Sterile Irrigation Soln: 300 mL | NDC 99999080061

## 2024-08-29 MED ADMIN — Quetiapine Fumarate Tab 25 MG: 150 mg | NDC 67877024901

## 2024-08-29 MED ADMIN — Insulin Aspart Inj Soln 100 Unit/ML: 7 [IU] | SUBCUTANEOUS | NDC 73070010011

## 2024-08-29 MED ADMIN — Insulin Aspart Inj Soln 100 Unit/ML: 7 [IU] | SUBCUTANEOUS | NDC 00169750111

## 2024-08-29 MED ADMIN — Insulin Aspart Inj Soln 100 Unit/ML: 11 [IU] | SUBCUTANEOUS | NDC 00169750111

## 2024-08-29 MED ADMIN — Acetaminophen Tab 500 MG: 1000 mg | NDC 50580045711

## 2024-08-29 MED ADMIN — Rocuronium Bromide IV Soln Pref Syr 100 MG/10ML (10 MG/ML): 100 mg | INTRAVENOUS | NDC 99999070048

## 2024-08-29 MED ADMIN — Oxycodone HCl Tab 5 MG: 10 mg | NDC 68084035411

## 2024-08-29 MED ADMIN — Enoxaparin Sodium Inj Soln Pref Syr 40 MG/0.4ML: 40 mg | SUBCUTANEOUS | NDC 00781324602

## 2024-08-29 MED ADMIN — Dexmedetomidine HCl in NaCl 0.9% IV Soln 400 MCG/100ML: 0.8 ug/kg/h | INTRAVENOUS | NDC 00338955712

## 2024-08-29 MED ADMIN — Dexmedetomidine HCl in NaCl 0.9% IV Soln 400 MCG/100ML: 1 ug/kg/h | INTRAVENOUS | NDC 00338955712

## 2024-08-29 MED ADMIN — Dexmedetomidine HCl in NaCl 0.9% IV Soln 400 MCG/100ML: 1.4 ug/kg/h | INTRAVENOUS | NDC 00338955712

## 2024-08-29 MED ADMIN — Dexmedetomidine HCl in NaCl 0.9% IV Soln 400 MCG/100ML: 1.2 ug/kg/h | INTRAVENOUS | NDC 00338955712

## 2024-08-29 MED ADMIN — Sodium Chloride Soln Nebu 3%: 4 mL | RESPIRATORY_TRACT | NDC 00487900360

## 2024-08-29 MED ADMIN — Chlorhexidine Gluconate Pads 2%: 6 | TOPICAL | NDC 53462070523

## 2024-08-29 MED ADMIN — Ipratropium-Albuterol Nebu Soln 0.5-2.5(3) MG/3ML: 3 mL | RESPIRATORY_TRACT | NDC 00487020101

## 2024-08-29 MED ADMIN — Midazolam HCl Inj 50 MG/10ML (Base Equivalent): 2 mg | INTRAVENOUS

## 2024-08-29 MED ADMIN — Midazolam HCl Inj 50 MG/10ML (Base Equivalent): 5 mg | INTRAVENOUS

## 2024-08-29 MED ADMIN — Polyethylene Glycol 3350 Oral Packet 17 GM: 17 g | NDC 00904693186

## 2024-08-29 MED ADMIN — Cholecalciferol Tab 25 MCG (1000 Unit): 1000 [IU] | NDC 2055503300

## 2024-08-29 MED ADMIN — Dexamethasone Sod Phosphate Preservative Free Inj 10 MG/ML: 10 mg | INTRAVENOUS | NDC 25021005301

## 2024-08-29 MED ADMIN — Metoprolol Tartrate IV Soln 5 MG/5ML: 5 mg | INTRAVENOUS | NDC 00409177815

## 2024-08-29 MED ADMIN — Oxycodone HCl Tab 5 MG: 10 mg | NDC 00406055223

## 2024-08-29 MED ADMIN — Hydralazine HCl Inj 20 MG/ML: 20 mg | INTRAVENOUS | NDC 72572026501

## 2024-08-29 MED ADMIN — Hydralazine HCl Inj 20 MG/ML: 10 mg | INTRAVENOUS | NDC 72572026501

## 2024-08-29 MED ADMIN — Pantoprazole Sodium For IV Soln 40 MG (Base Equiv): 40 mg | INTRAVENOUS | NDC 55150020200

## 2024-08-29 MED ADMIN — Midazolam HCl Inj PF 2 MG/2ML (Base Equivalent): 2 mg | INTRAVENOUS | NDC 00409000101

## 2024-08-29 MED ADMIN — Midazolam 100 MG/100ML-Sodium Chloride 0.9% IV Soln: 0.5 mg/h | INTRAVENOUS | NDC 00143938001

## 2024-08-29 MED ADMIN — Midazolam 100 MG/100ML-Sodium Chloride 0.9% IV Soln: 9 mg/h | INTRAVENOUS | NDC 00143938010

## 2024-08-29 MED ADMIN — Guaifenesin Liquid 100 MG/5ML: 10 mL | NDC 00121223215

## 2024-08-29 MED ADMIN — Phenylephrine-NaCl IV Solution 20 MG/250ML-0.9%: 30 ug/min | INTRAVENOUS | NDC 70004080840

## 2024-08-29 MED ADMIN — Midazolam HCl Inj 2 MG/2ML (Base Equivalent): 2 mg | INTRAVENOUS | NDC 00409000101

## 2024-08-29 MED ADMIN — Fentanyl Citrate Preservative Free (PF) Inj 250 MCG/5ML: 100 ug | INTRAVENOUS | NDC 72572017125

## 2024-08-29 MED ADMIN — Fentanyl Citrate Preservative Free (PF) Inj 250 MCG/5ML: 150 ug | INTRAVENOUS | NDC 72572017125

## 2024-08-29 MED ADMIN — Rocuronium Bromide IV Soln Pref Syr 100 MG/10ML (10 MG/ML): 50 mg | INTRAVENOUS | NDC 99999070048

## 2024-08-29 MED ADMIN — Rocuronium Bromide IV Soln Pref Syr 100 MG/10ML (10 MG/ML): 20 mg | INTRAVENOUS | NDC 99999070048

## 2024-08-29 MED ADMIN — Quetiapine Fumarate Tab 100 MG: 100 mg | NDC 29300014901

## 2024-08-29 MED ADMIN — Lidocaine Inj 1.5% w/ Epinephrine-1:200000 (PF): 5 mL | SUBCUTANEOUS | NDC 99999100028

## 2024-08-29 MED ADMIN — Metoprolol Tartrate IV Soln 5 MG/5ML: 10 mg | INTRAVENOUS | NDC 00409177815

## 2024-08-29 MED ADMIN — Haloperidol Lactate Inj 5 MG/ML: 5 mg | INTRAVENOUS | NDC 67457042600

## 2024-08-29 MED ADMIN — Sodium Chloride Irrigation Soln 0.9%: 1000 mL | NDC 99999050048

## 2024-08-29 MED FILL — Midazolam 100 MG/100ML-Sodium Chloride 0.9% IV Soln: 0.5000 mg/h | INTRAVENOUS | Qty: 100 | Status: AC

## 2024-08-29 MED FILL — Rocuronium Bromide IV Soln Pref Syr 100 MG/10ML (10 MG/ML): INTRAVENOUS | Qty: 10 | Status: AC

## 2024-08-29 MED FILL — Oxycodone HCl Tab 5 MG: 10.0000 mg | ORAL | Qty: 2 | Status: AC

## 2024-08-29 MED FILL — Metoprolol Tartrate IV Soln 5 MG/5ML: 10.0000 mg | INTRAVENOUS | Qty: 10 | Status: AC

## 2024-08-29 MED FILL — Amino Acid Infusion 10%: INTRAVENOUS | Qty: 685.2 | Status: AC

## 2024-08-29 MED FILL — Quetiapine Fumarate Tab 50 MG: 150.0000 mg | ORAL | Qty: 1 | Status: AC

## 2024-08-29 MED FILL — Sodium Chloride Soln Nebu 3%: 4.0000 mL | RESPIRATORY_TRACT | Qty: 4 | Status: AC

## 2024-08-29 MED FILL — Fentanyl Citrate Preservative Free (PF) Inj 250 MCG/5ML: INTRAMUSCULAR | Qty: 5 | Status: AC

## 2024-08-29 MED FILL — Midazolam HCl Inj 2 MG/2ML (Base Equivalent): INTRAMUSCULAR | Qty: 2 | Status: AC

## 2024-08-29 MED FILL — Midazolam HCl Inj 2 MG/2ML (Base Equivalent): 2.0000 mg | INTRAMUSCULAR | Qty: 2 | Status: AC

## 2024-08-29 MED FILL — Guaifenesin Liquid 100 MG/5ML: 10.0000 mL | ORAL | Qty: 15 | Status: AC

## 2024-08-29 MED FILL — Rocuronium Bromide IV Soln Pref Syr 100 MG/10ML (10 MG/ML): 100.0000 mg | INTRAVENOUS | Qty: 10 | Status: AC
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
## 2024-08-29 NOTE — Transfer of Care (Signed)
 Immediate Anesthesia Transfer of Care Note  Patient: Justin Chaney  Procedure(s) Performed: LAPAROTOMY, EXPLORATORY CREATION, TRACHEOSTOMY INSERTION OF MESH  Patient Location: ICU  Anesthesia Type:General  Level of Consciousness: sedated and Patient remains intubated per anesthesia plan  Airway & Oxygen Therapy: Patient remains intubated per anesthesia plan and Patient placed on Ventilator (see vital sign flow sheet for setting)  Post-op Assessment: Report given to RN and Post -op Vital signs reviewed and stable  Post vital signs: Reviewed and stable  Last Vitals:  Vitals Value Taken Time  BP 113/68 1806 08/29/24  Temp 38.0 1806 08/29/24  Pulse 97 1806 08/29/24  Resp 16 1806 08/29/24  SpO2 98 1806 08/29/24    Last Pain:  Vitals:   08/29/24 1226  TempSrc: Oral  PainSc:          Complications: No notable events documented.
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
## 2024-08-29 NOTE — Consult Note (Addendum)
" °  CLINICAL SUPPORT TEAM - WOUND OSTOMY AND CONTINENCE TEAM  CONSULTATION SERVICES   WOC Nurse-Inpatient Note   WOC Nurse Consult Note: Dr Paola at the bedside to assess wound during Vac change. Pt has eviscerated with exposed bowel, mod amt bloody drainage in the cannister before the dressing change and also located at 12:00 o'clock in the wound bed.  Full thickness midline abd 29X5X4cm.  Vac d/ced at this time and moist gauze dressing applied.    Pt plans to go back to the OR as soon as possible, WOC team will change dressing again on Fri.  Pt has a full thickness mucosal injury to his tongue from teeth and intubation tube. Topical treatment will be minimally effective; please refer to ENT team for further plan of care.   Thank-you,  Stephane Fought MSN, RN, CWOCN, CWCN-AP, CNS Contact Mon-Fri 0700-1500: [phone]   "

## 2024-08-29 NOTE — Progress Notes (Signed)
" ° °  Operative Note   Date: 08/29/2024  Procedure: percutaneous tracheostomy, exploratory laparotomy, vicryl mesh underlay, placement of retention sutures  Pre-op diagnosis: evisceration Post-op diagnosis: evisceration, punctate area of bile staining of the anastomosis  Indication and clinical history: The patient is a 29 y.o. year old male with evisceration for the second time.     Surgeon: Dreama GEANNIE Hanger, MD  Anesthesiologist: Corinne, MD; Gene, MD Anesthesia: General  Findings:  Specimen: none EBL: 25cc Drains/Implants: vicryl mesh underlay  Disposition: ICU - intubated and hemodynamically stable.  Description of procedure: The patient was positioned supine on the operating room table. General anesthetic induction and intubation were uneventful. Time-out was performed verifying correct patient, procedure, signature of informed consent, and administration of pre-operative antibiotics. The patient was confirmed to be on 100% FiO2. The neck was prepped and draped in the usual sterile fashion. Palpation of neck anatomy was performed. A longitudinal incision was made in the neck and deepened down to the trachea.   The pilot balloon was deflated and the endotracheal tube slowly retracted proximally until the tip of the endotracheal tube was palpated just below the cricoid cartilage. An introducer needle was inserted between the second and third tracheal rings. A guidewire was passed through the introducer needle and the needle removed. Serial dilation was performed and a #6 cuffed Shiley and stylet were inserted over the guidewire and the guidewire and stylet removed. The inner cannula was inserted, the pilot balloon on the tracheostomy inflated, and the ventilator tubing disconnected from the endotracheal tube and connected to the tracheostomy. Chest rise, appropriate end-tidal CO2, and return tidal volumes were confirmed. The tracheostomy tube was sutured in four quadrants and a tracheostomy  tie applied to the neck. The endotracheal tube was removed. The patient tolerated the procedure well. There were no complications. Post-procedure chest x-ray was ordered to confirm tube position and the absence of a pneumothorax.  Next, the abdomen was prepped and draped in the usual sterile fashion. The fascia surrounding the eviscerated small bowel was opened and all sutures removed. All knots were intact, so it seemed as though the suture had pulled through the fascia. A limited inspection of the abdominal cavity was performed due to adhesive disease. The anastomosis was inspected and a punctate area of bile staining of the anastomosis was identified. There was no obvious feculent spillage with manipulation and due to adhesions, the anastomosis was not able to be completely mobilized. A second surgeon was called in for an additional opinion and we both felt it was safer to simply monitor clinically. Attention was then taken to addressing closure. A vicryl mesh was inserted in an underlay fashion and sutured in place with #1 PDS suture. Four retention sutures were places as well. A moistened kerlix was inserted into the intervening tissues between the retention sutures and a dry dressing applied on top.   All sponge and instrument counts were correct at the conclusion of the procedure. The patient was transported back to the ICU in stable condition. There were no complications.     Dreama GEANNIE Hanger, MD General and Trauma Surgery [facility] "
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
## 2024-08-29 NOTE — Addendum Note (Signed)
 Addendum  created 08/29/24 1028 by Darlyn Rush, MD   Intraprocedure Event edited

## 2024-08-30 ENCOUNTER — Encounter (HOSPITAL_COMMUNITY): Payer: Self-pay | Admitting: Surgery

## 2024-08-30 LAB — GLUCOSE, CAPILLARY
Glucose-Capillary: 207 mg/dL — ABNORMAL HIGH (ref 70–99)
Glucose-Capillary: 225 mg/dL — ABNORMAL HIGH (ref 70–99)
Glucose-Capillary: 226 mg/dL — ABNORMAL HIGH (ref 70–99)
Glucose-Capillary: 238 mg/dL — ABNORMAL HIGH (ref 70–99)
Glucose-Capillary: 258 mg/dL — ABNORMAL HIGH (ref 70–99)
Glucose-Capillary: 263 mg/dL — ABNORMAL HIGH (ref 70–99)

## 2024-08-30 LAB — CBC
HCT: 36.1 % — ABNORMAL LOW (ref 39.0–52.0)
Hemoglobin: 10.9 g/dL — ABNORMAL LOW (ref 13.0–17.0)
MCH: 28.4 pg (ref 26.0–34.0)
MCHC: 30.2 g/dL (ref 30.0–36.0)
MCV: 94 fL (ref 80.0–100.0)
Platelets: 286 10*3/uL (ref 150–400)
RBC: 3.84 MIL/uL — ABNORMAL LOW (ref 4.22–5.81)
RDW: 14.8 % (ref 11.5–15.5)
WBC: 19.5 10*3/uL — ABNORMAL HIGH (ref 4.0–10.5)
nRBC: 0 % (ref 0.0–0.2)

## 2024-08-30 LAB — MAGNESIUM: Magnesium: 2.3 mg/dL (ref 1.7–2.4)

## 2024-08-30 LAB — TRIGLYCERIDES: Triglycerides: 156 mg/dL — ABNORMAL HIGH

## 2024-08-30 MED ADMIN — Sennosides Tab 8.6 MG: 8.6 mg | NDC 00904725261

## 2024-08-30 MED ADMIN — Sennosides Tab 8.6 MG: 8.6 mg | NDC 00904725280

## 2024-08-30 MED ADMIN — Fentanyl Citrate Preservative Free (PF) Inj 100 MCG/2ML: 100 ug | INTRAVENOUS

## 2024-08-30 MED ADMIN — ORAL CARE MOUTH RINSE: 15 mL | OROMUCOSAL | NDC 99999080097

## 2024-08-30 MED ADMIN — Fentanyl Citrate Preservative Free (PF) Inj 100 MCG/2ML: 300 ug/h | INTRAVENOUS | NDC 72572017201

## 2024-08-30 MED ADMIN — Docusate Sodium Liquid 150 MG/15ML: 100 mg | NDC 00121187010

## 2024-08-30 MED ADMIN — Water For Irrigation, Sterile Irrigation Soln: 300 mL | NDC 99999080061

## 2024-08-30 MED ADMIN — Quetiapine Fumarate Tab 25 MG: 150 mg | NDC 29300014901

## 2024-08-30 MED ADMIN — Insulin Aspart Inj Soln 100 Unit/ML: 11 [IU] | SUBCUTANEOUS | NDC 73070010011

## 2024-08-30 MED ADMIN — Insulin Aspart Inj Soln 100 Unit/ML: 7 [IU] | SUBCUTANEOUS | NDC 00169750111

## 2024-08-30 MED ADMIN — Insulin Aspart Inj Soln 100 Unit/ML: 7 [IU] | SUBCUTANEOUS | NDC 73070010011

## 2024-08-30 MED ADMIN — Acetaminophen Tab 500 MG: 1000 mg | NDC 50580045711

## 2024-08-30 MED ADMIN — Oxycodone HCl Tab 5 MG: 10 mg | NDC 68084035411

## 2024-08-30 MED ADMIN — Oxycodone HCl Tab 5 MG: 10 mg | NDC 00406055223

## 2024-08-30 MED ADMIN — Enoxaparin Sodium Inj Soln Pref Syr 40 MG/0.4ML: 40 mg | SUBCUTANEOUS | NDC 16714001601

## 2024-08-30 MED ADMIN — Diazepam Tab 5 MG: 5 mg | NDC 51079028501

## 2024-08-30 MED ADMIN — Dexmedetomidine HCl in NaCl 0.9% IV Soln 400 MCG/100ML: 1 ug/kg/h | INTRAVENOUS | NDC 00338955712

## 2024-08-30 MED ADMIN — Dexmedetomidine HCl in NaCl 0.9% IV Soln 400 MCG/100ML: 1.4 ug/kg/h | INTRAVENOUS | NDC 00338955712

## 2024-08-30 MED ADMIN — Dexmedetomidine HCl in NaCl 0.9% IV Soln 400 MCG/100ML: 1.6 ug/kg/h | INTRAVENOUS | NDC 00338955712

## 2024-08-30 MED ADMIN — Dexmedetomidine HCl in NaCl 0.9% IV Soln 400 MCG/100ML: 1.7 ug/kg/h | INTRAVENOUS | NDC 00338955712

## 2024-08-30 MED ADMIN — Dexmedetomidine HCl in NaCl 0.9% IV Soln 400 MCG/100ML: 1.2 ug/kg/h | INTRAVENOUS | NDC 00338955712

## 2024-08-30 MED ADMIN — Dexmedetomidine HCl in NaCl 0.9% IV Soln 400 MCG/100ML: 1.1 ug/kg/h | INTRAVENOUS | NDC 00338955712

## 2024-08-30 MED ADMIN — Guaifenesin Liquid 100 MG/5ML: 10 mL | NDC 81033010215

## 2024-08-30 MED ADMIN — Guaifenesin Liquid 100 MG/5ML: 10 mL | NDC 00121223215

## 2024-08-30 MED ADMIN — Sodium Chloride Soln Nebu 3%: 4 mL | RESPIRATORY_TRACT | NDC 00487900360

## 2024-08-30 MED ADMIN — Chlorhexidine Gluconate Pads 2%: 6 | TOPICAL | NDC 53462070523

## 2024-08-30 MED ADMIN — Ipratropium-Albuterol Nebu Soln 0.5-2.5(3) MG/3ML: 3 mL | RESPIRATORY_TRACT | NDC 00487020101

## 2024-08-30 MED ADMIN — Polyethylene Glycol 3350 Oral Packet 17 GM: 17 g | NDC 00904693186

## 2024-08-30 MED ADMIN — Polyethylene Glycol 3350 Oral Packet 17 GM: 17 g | NDC 62559015710

## 2024-08-30 MED ADMIN — Cholecalciferol Tab 25 MCG (1000 Unit): 1000 [IU] | NDC 2055503300

## 2024-08-30 MED ADMIN — Dexamethasone Sod Phosphate Preservative Free Inj 10 MG/ML: 10 mg | INTRAVENOUS | NDC 25021005301

## 2024-08-30 MED ADMIN — Ondansetron HCl Inj 4 MG/2ML (2 MG/ML): 4 mg | INTRAVENOUS | NDC 00409475518

## 2024-08-30 MED ADMIN — Pantoprazole Sodium For IV Soln 40 MG (Base Equiv): 40 mg | INTRAVENOUS | NDC 55150020200

## 2024-08-30 MED ADMIN — Insulin Glargine-yfgn Inj 100 Unit/ML: 10 [IU] | SUBCUTANEOUS | NDC 83257001411

## 2024-08-30 MED ADMIN — Cefazolin Sodium-Dextrose IV Solution 2 GM/100ML-4%: 2 g | INTRAVENOUS | NDC 00338350841

## 2024-08-30 MED ADMIN — Albumin, Human Inj 5%: 12.5 g | INTRAVENOUS | NDC 44206031025

## 2024-08-30 MED FILL — Guaifenesin Liquid 100 MG/5ML: 10.0000 mL | ORAL | Qty: 15 | Status: AC

## 2024-08-30 MED FILL — Oxycodone HCl Tab 5 MG: 10.0000 mg | ORAL | Qty: 2 | Status: AC

## 2024-08-30 MED FILL — Diazepam Tab 5 MG: 5.0000 mg | ORAL | Qty: 1 | Status: AC

## 2024-08-30 MED FILL — Cefazolin Sodium-Dextrose IV Solution 2 GM/100ML-4%: 2.0000 g | INTRAVENOUS | Qty: 100 | Status: AC

## 2024-08-30 MED FILL — Enoxaparin Sodium Inj Soln Pref Syr 40 MG/0.4ML: 40.0000 mg | INTRAMUSCULAR | Qty: 0.4 | Status: AC

## 2024-08-30 MED FILL — Amino Acid Infusion 15%: INTRAVENOUS | Qty: 777.27 | Status: AC

## 2024-08-30 MED FILL — Albumin, Human Inj 5%: 12.5000 g | INTRAVENOUS | Qty: 250 | Status: AC

## 2024-08-30 MED FILL — Insulin Glargine-yfgn Inj 100 Unit/ML: 10.0000 [IU] | SUBCUTANEOUS | Qty: 0.1 | Status: AC

## 2024-08-30 NOTE — TOC Progression Note (Signed)
 Transition of Care [facility]) - Progression Note    Patient Details  Name: Justin Chaney MRN: [id number] Date of Birth: 1995-10-26  Transition of Care [facility]) CM/SW Contact  Xzaviar Maloof E Shantanu Strauch, LCSW Phone Number: 08/30/2024, 1:23 PM  Clinical Narrative:    ICM continues to follow, noted trach placed yesterday 08/29/24.     Barriers to Discharge: Continued Medical Work up               Expected Discharge Plan and Services   Discharge Planning Services: CM Consult   Living arrangements for the past 2 months: Homeless                                       Social Drivers of Health (SDOH) Interventions SDOH Screenings   Food Insecurity: Patient Unable To Answer (08/19/2024)    Readmission Risk Interventions     No data to display
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
## 2024-08-30 NOTE — Progress Notes (Signed)
 OT Cancellation Note  Patient Details Name: Justin Chaney MRN: [id number] DOB: Nov 26, 1995   Cancelled Treatment:    Reason Eval/Treat Not Completed: Medical issues which prohibited therapy (RN hold, pt too sedated to participate, now s/p trach placement. Will continue efforts as appropraite to participate in OT evaluation.)   Chinaza Rooke M. Burma, OTR/L [facility] Acute Rehabilitation Services [phone] Secure Chat Preferred  Rikki Burma 08/30/2024, 10:20 AM
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
## 2024-08-30 NOTE — Progress Notes (Signed)
 PT Cancellation Note  Patient Details Name: Justin Chaney MRN: [id number] DOB: Apr 19, 1996   Cancelled Treatment:    Reason Eval/Treat Not Completed: Medical issues which prohibited therapy;Patient not medically ready.  Not ready, too sedated.  Will sign off today and await a reorder to continue.  Thanks. 08/30/2024  India HERO., PT Acute Rehabilitation Services [phone]  (office)   Justin Chaney 08/30/2024, 10:45 AM

## 2024-08-30 NOTE — Inpatient Diabetes Management (Signed)
 Inpatient Diabetes Program Recommendations  AACE/ADA: New Consensus Statement on Inpatient Glycemic Control (2015)  Target Ranges:  Prepandial:   less than 140 mg/dL      Peak postprandial:   less than 180 mg/dL (1-2 hours)      Critically ill patients:  140 - 180 mg/dL   Lab Results  Component Value Date   GLUCAP 258 (H) 08/30/2024   HGBA1C 5.4 08/24/2024    Latest Reference Range & Units 08/29/24 03:30 08/29/24 08:32 08/29/24 12:25 08/29/24 19:27 08/29/24 23:24 08/30/24 03:34 08/30/24 07:32  Glucose-Capillary 70 - 99 mg/dL 757 (H) 771 (H) 770 (H) 251 (H) 225 (H) 225 (H) 258 (H)  (H): Data is abnormally high  Diabetes history: No hx Current orders for Inpatient glycemic control: Novolog  0-20 units q 4 hrs. TPN with 15 units insulin   Inpatient Diabetes Program Recommendations:   Please consider tube feed coverage: -Novolog  3 units q 4 hrs. (Hold if tube feed held or stopped for any reason)  Thank you, Lynell Greenhouse E. Elli Groesbeck, RN, MSN, CNS, CDCES  Diabetes Coordinator Inpatient Glycemic Control Team Team Pager [phone] (8am-5pm) 08/30/2024 10:47 AM

## 2024-08-30 NOTE — Anesthesia Postprocedure Evaluation (Signed)
"   Anesthesia Post Note  Patient: Justin Chaney  Procedure(s) Performed: LAPAROTOMY, EXPLORATORY CREATION, TRACHEOSTOMY INSERTION OF MESH     Patient location during evaluation: ICU Anesthesia Type: General Level of consciousness: sedated Pain management: pain level controlled Vital Signs Assessment: post-procedure vital signs reviewed and stable Respiratory status: patient on ventilator - see flowsheet for VS Cardiovascular status: stable Postop Assessment: no apparent nausea or vomiting Anesthetic complications: no   No notable events documented.  Last Vitals:  Vitals:   08/30/24 0600 08/30/24 0700  BP: 103/62 102/69  Pulse: 91 89  Resp: (!) 21 19  Temp:    SpO2: 100% 100%    Last Pain:  Vitals:   08/30/24 0400  TempSrc: Axillary  PainSc:                  Nicko Daher P Jasmeen Fritsch      "

## 2024-08-30 NOTE — Consult Note (Addendum)
" ° °  CLINICAL SUPPORT TEAM - WOUND OSTOMY AND CONTINENCE TEAM  CONSULTATION SERVICES   WOC Nurse-Inpatient Note  WOC Nurse wound follow up Pt went to the OR yesterday and did not have a Vac placed to his abd full thickness wound at that time. Surgical team following for assessment and plan of care. No further role for WOC team.  Please re-consult if further assistance is needed.  Thank-you,  Stephane Fought MSN, RN, CWOCN, CWCN-AP, CNS Contact Mon-Fri 0700-1500: [phone]  "

## 2024-08-30 NOTE — Progress Notes (Signed)
 SLP Cancellation Note  Patient Details Name: Justin Chaney MRN: [id number] DOB: Aug 29, 1995   Cancelled treatment:       Reason Eval/Treat Not Completed: Other (comment) Patient with new tracheostomy. Orders for SLP eval and treat for PMSV, cognition, and swallowing received. Will follow pt closely for readiness for SLP interventions as appropriate.     Leita SAILOR., M.A. CCC-SLP Acute Rehabilitation Services Office: [phone]  Secure chat preferred  08/30/2024, 10:15 AM

## 2024-08-31 MED ADMIN — Sennosides Tab 8.6 MG: 8.6 mg | NDC 00904725280

## 2024-08-31 MED ADMIN — ORAL CARE MOUTH RINSE: 15 mL | OROMUCOSAL | NDC 99999080097

## 2024-08-31 MED ADMIN — Fentanyl Citrate Preservative Free (PF) Inj 100 MCG/2ML: 300 ug/h | INTRAVENOUS | NDC 72572017201

## 2024-08-31 MED ADMIN — Docusate Sodium Liquid 150 MG/15ML: 100 mg | NDC 00121187010

## 2024-08-31 MED ADMIN — Water For Irrigation, Sterile Irrigation Soln: 300 mL | NDC 99999080061

## 2024-08-31 MED ADMIN — Quetiapine Fumarate Tab 25 MG: 150 mg | NDC 50268063111

## 2024-08-31 MED ADMIN — Insulin Aspart Inj Soln 100 Unit/ML: 7 [IU] | SUBCUTANEOUS | NDC 73070010011

## 2024-08-31 MED ADMIN — Insulin Aspart Inj Soln 100 Unit/ML: 7 [IU] | SUBCUTANEOUS | NDC 00169750111

## 2024-08-31 MED ADMIN — Acetaminophen Tab 500 MG: 1000 mg | NDC 50580045711

## 2024-08-31 MED ADMIN — Diazepam Tab 5 MG: 5 mg | NDC 51079028501

## 2024-08-31 MED ADMIN — Aripiprazole Tab 5 MG: 5 mg | NDC 50268008811

## 2024-08-31 MED ADMIN — Fentanyl Citrate PF Soln Prefilled Syringe 50 MCG/ML: 50 ug | INTRAVENOUS | NDC 63323080801

## 2024-08-31 MED ADMIN — Dexmedetomidine HCl in NaCl 0.9% IV Soln 400 MCG/100ML: 1.3 ug/kg/h | INTRAVENOUS | NDC 00338955712

## 2024-08-31 MED ADMIN — Dexmedetomidine HCl in NaCl 0.9% IV Soln 400 MCG/100ML: 1 ug/kg/h | INTRAVENOUS | NDC 00338955712

## 2024-08-31 MED ADMIN — Dexmedetomidine HCl in NaCl 0.9% IV Soln 400 MCG/100ML: 0.7 ug/kg/h | INTRAVENOUS | NDC 00338955712

## 2024-08-31 MED ADMIN — Dexmedetomidine HCl in NaCl 0.9% IV Soln 400 MCG/100ML: 1.2 ug/kg/h | INTRAVENOUS | NDC 00338955712

## 2024-08-31 MED ADMIN — Oxycodone HCl Tab 5 MG: 10 mg | NDC 10702001850

## 2024-08-31 MED ADMIN — Oxycodone HCl Tab 5 MG: 10 mg | NDC 68084035411

## 2024-08-31 MED ADMIN — Guaifenesin Liquid 100 MG/5ML: 10 mL | NDC 81033010215

## 2024-08-31 MED ADMIN — Guaifenesin Liquid 100 MG/5ML: 10 mL | NDC 00121223215

## 2024-08-31 MED ADMIN — Sodium Chloride Soln Nebu 3%: 4 mL | RESPIRATORY_TRACT | NDC 00487900360

## 2024-08-31 MED ADMIN — Chlorhexidine Gluconate Pads 2%: 6 | TOPICAL | NDC 53462070523

## 2024-08-31 MED ADMIN — Sodium Chloride Soln Nebu 3%: 4 mL | NDC 00487900360

## 2024-08-31 MED ADMIN — Polyethylene Glycol 3350 Oral Packet 17 GM: 17 g | NDC 62559015710

## 2024-08-31 MED ADMIN — Cholecalciferol Tab 25 MCG (1000 Unit): 1000 [IU] | NDC 2055503300

## 2024-08-31 MED ADMIN — Metoprolol Tartrate IV Soln 5 MG/5ML: 5 mg | INTRAVENOUS | NDC 00409177815

## 2024-08-31 MED ADMIN — Pantoprazole Sodium For IV Soln 40 MG (Base Equiv): 40 mg | INTRAVENOUS | NDC 55150020200

## 2024-08-31 MED ADMIN — Lorazepam Inj 2 MG/ML: 2 mg | INTRAVENOUS | NDC 00641604401

## 2024-08-31 MED ADMIN — Sodium Chloride Flush IV Soln 0.9%: 10 mL | NDC 68883060010

## 2024-08-31 MED ADMIN — Enoxaparin Sodium Inj Soln Pref Syr 40 MG/0.4ML: 40 mg | SUBCUTANEOUS | NDC 71288043382

## 2024-08-31 MED ADMIN — Enoxaparin Sodium Inj Soln Pref Syr 40 MG/0.4ML: 40 mg | SUBCUTANEOUS | NDC 16714001601

## 2024-08-31 MED ADMIN — Insulin Glargine-yfgn Inj 100 Unit/ML: 10 [IU] | SUBCUTANEOUS | NDC 83257001411

## 2024-08-31 MED ADMIN — Cefazolin Sodium-Dextrose IV Solution 2 GM/100ML-4%: 2 g | INTRAVENOUS | NDC 00338350841

## 2024-08-31 MED ADMIN — Fentanyl Citrate-NaCl 0.9% IV Soln 2.5 MG/250ML: 50 ug/h | INTRAVENOUS | NDC 99999070038

## 2024-08-31 MED ADMIN — Haloperidol Lactate Inj 5 MG/ML: 5 mg | INTRAVENOUS | NDC 67457042600

## 2024-09-01 MED ADMIN — Sennosides Tab 8.6 MG: 8.6 mg | NDC 00904725261

## 2024-09-01 MED ADMIN — ORAL CARE MOUTH RINSE: 15 mL | OROMUCOSAL | NDC 99999080097

## 2024-09-01 MED ADMIN — Docusate Sodium Liquid 150 MG/15ML: 100 mg | NDC 00121187010

## 2024-09-01 MED ADMIN — Water For Irrigation, Sterile Irrigation Soln: 300 mL | NDC 99999080061

## 2024-09-01 MED ADMIN — Insulin Aspart Inj Soln 100 Unit/ML: 7 [IU] | SUBCUTANEOUS | NDC 73070010011

## 2024-09-01 MED ADMIN — Insulin Aspart Inj Soln 100 Unit/ML: 7 [IU] | SUBCUTANEOUS | NDC 00169750111

## 2024-09-01 MED ADMIN — Insulin Aspart Inj Soln 100 Unit/ML: 4 [IU] | SUBCUTANEOUS | NDC 73070010011

## 2024-09-01 MED ADMIN — Insulin Aspart Inj Soln 100 Unit/ML: 11 [IU] | SUBCUTANEOUS | NDC 00169750111

## 2024-09-01 MED ADMIN — Acetaminophen Tab 500 MG: 1000 mg | NDC 50580045711

## 2024-09-01 MED ADMIN — Midazolam HCl Inj 50 MG/10ML (Base Equivalent): 4 mg | INTRAVENOUS | NDC 00143938001

## 2024-09-01 MED ADMIN — Diazepam Tab 5 MG: 5 mg | NDC 51079028501

## 2024-09-01 MED ADMIN — Aripiprazole Tab 5 MG: 5 mg | NDC 50268008811

## 2024-09-01 MED ADMIN — White Petrolatum-Mineral Oil Ophth Ointment: 1 | OPHTHALMIC | NDC 00904648838

## 2024-09-01 MED ADMIN — Dexmedetomidine HCl in NaCl 0.9% IV Soln 400 MCG/100ML: 1.2 ug/kg/h | INTRAVENOUS | NDC 00338955712

## 2024-09-01 MED ADMIN — Dexmedetomidine HCl in NaCl 0.9% IV Soln 400 MCG/100ML: 1 ug/kg/h | INTRAVENOUS | NDC 00338955712

## 2024-09-01 MED ADMIN — Oxycodone HCl Tab 5 MG: 10 mg | NDC 00406055223

## 2024-09-01 MED ADMIN — Guaifenesin Liquid 100 MG/5ML: 10 mL | NDC 00121223215

## 2024-09-01 MED ADMIN — Midazolam 100 MG/100ML-Sodium Chloride 0.9% IV Soln: 5 mg/h | INTRAVENOUS | NDC 00143938001

## 2024-09-01 MED ADMIN — Chlorhexidine Gluconate Pads 2%: 6 | TOPICAL | NDC 53462070523

## 2024-09-01 MED ADMIN — Propofol IV Emul 1000 MG/100ML (10 MG/ML): 25 ug/kg/min | INTRAVENOUS | NDC 00069024801

## 2024-09-01 MED ADMIN — Propofol IV Emul 1000 MG/100ML (10 MG/ML): 80 ug/kg/min | INTRAVENOUS | NDC 00069024801

## 2024-09-01 MED ADMIN — Phenylephrine-NaCl IV Solution 20 MG/250ML-0.9%: 160 ug | INTRAVENOUS | NDC 70004080840

## 2024-09-01 MED ADMIN — Sodium Chloride Irrigation Soln 0.9%: 1000 mL | NDC 99999050048

## 2024-09-01 MED ADMIN — Polyethylene Glycol 3350 Oral Packet 17 GM: 17 g | NDC 62559015710

## 2024-09-01 MED ADMIN — Rocuronium Bromide IV Soln Pref Syr 100 MG/10ML (10 MG/ML): 100 mg | INTRAVENOUS | NDC 99999070048

## 2024-09-01 MED ADMIN — Cholecalciferol Tab 25 MCG (1000 Unit): 1000 [IU] | NDC 2055503300

## 2024-09-01 MED ADMIN — CEFEPIME 2 GM IVPB: 2 g | INTRAVENOUS | NDC 00409973501

## 2024-09-01 MED ADMIN — Rocuronium Bromide IV Soln 50 MG/5ML (10 MG/ML): 88.7 mg | INTRAVENOUS

## 2024-09-01 MED ADMIN — Pantoprazole Sodium For IV Soln 40 MG (Base Equiv): 40 mg | INTRAVENOUS | NDC 55150020200

## 2024-09-01 MED ADMIN — Furosemide Inj 10 MG/ML: 60 mg | INTRAVENOUS | NDC 71288020302

## 2024-09-01 MED ADMIN — Norepinephrine-Dextrose IV Solution 4 MG/250ML-5%: 2 ug/min | INTRAVENOUS | NDC 00338011220

## 2024-09-01 MED ADMIN — Fentanyl Citrate Preservative Free (PF) Inj 100 MCG/2ML: 100 ug | INTRAVENOUS | NDC 99999070038

## 2024-09-01 MED ADMIN — Metronidazole IV Soln 500 MG/100ML: 500 mg | INTRAVENOUS | NDC 00338105548

## 2024-09-01 MED ADMIN — ROCURONIUM INFUSION: 4 ug/kg/min | INTRAVENOUS | NDC 70092248046

## 2024-09-01 MED ADMIN — Enoxaparin Sodium Inj Soln Pref Syr 40 MG/0.4ML: 40 mg | SUBCUTANEOUS | NDC 71288043382

## 2024-09-01 MED ADMIN — Cefazolin Sodium-Dextrose IV Solution 2 GM/100ML-4%: 2 g | INTRAVENOUS | NDC 00338350841

## 2024-09-01 MED ADMIN — Phenylephrine HCl IV Soln 10 MG/ML: 160 ug | INTRAVENOUS | NDC 00641614225

## 2024-09-01 MED ADMIN — Fentanyl Citrate-NaCl 0.9% IV Soln 2.5 MG/250ML: 300 ug/h | INTRAVENOUS | NDC 99999070038

## 2024-09-01 MED ADMIN — Fentanyl Citrate-NaCl 0.9% IV Soln 2.5 MG/250ML: 350 ug/h | INTRAVENOUS | NDC 99999070038

## 2024-09-01 MED ADMIN — Acetaminophen IV Soln 10 MG/ML: 1000 mg | INTRAVENOUS | NDC 63323043400

## 2024-09-02 DIAGNOSIS — J9602 Acute respiratory failure with hypercapnia: Secondary | ICD-10-CM | POA: Diagnosis not present

## 2024-09-02 DIAGNOSIS — J15211 Pneumonia due to Methicillin susceptible Staphylococcus aureus: Secondary | ICD-10-CM | POA: Diagnosis not present

## 2024-09-02 DIAGNOSIS — R9431 Abnormal electrocardiogram [ECG] [EKG]: Secondary | ICD-10-CM

## 2024-09-02 DIAGNOSIS — A4101 Sepsis due to Methicillin susceptible Staphylococcus aureus: Secondary | ICD-10-CM | POA: Diagnosis not present

## 2024-09-02 DIAGNOSIS — J9601 Acute respiratory failure with hypoxia: Secondary | ICD-10-CM | POA: Diagnosis not present

## 2024-09-02 MED ADMIN — ORAL CARE MOUTH RINSE: 15 mL | OROMUCOSAL | NDC 99999080097

## 2024-09-02 MED ADMIN — Midazolam 100 MG/100ML-Sodium Chloride 0.9% IV Soln: 7.5 mg/h | INTRAVENOUS | NDC 00143938001

## 2024-09-02 MED ADMIN — Midazolam 100 MG/100ML-Sodium Chloride 0.9% IV Soln: 9.5 mg/h | INTRAVENOUS | NDC 00143938001

## 2024-09-02 MED ADMIN — Heparin Sod (Porcine) in NaCl IV Soln 25000 Unit/250ML-0.45%: 1600 [IU]/h | INTRAVENOUS | NDC 63323051701

## 2024-09-02 MED ADMIN — Insulin Aspart Inj Soln 100 Unit/ML: 11 [IU] | SUBCUTANEOUS | NDC 73070010011

## 2024-09-02 MED ADMIN — Fentanyl Citrate-NaCl 0.9% IV Soln 2.5 MG/250ML: 300 ug/h | INTRAVENOUS | NDC 99999070038

## 2024-09-02 MED ADMIN — Diazepam Tab 5 MG: 5 mg | NDC 51079028501

## 2024-09-02 MED ADMIN — Aripiprazole Tab 5 MG: 5 mg | NDC 50268008811

## 2024-09-02 MED ADMIN — White Petrolatum-Mineral Oil Ophth Ointment: 1 | OPHTHALMIC | NDC 00904648838

## 2024-09-02 MED ADMIN — CISATRACURIUM (NIMBEX) INFUSION 2 MG/ML: 3 ug/kg/min | INTRAVENOUS | NDC 00781315380

## 2024-09-02 MED ADMIN — Propofol IV Emul 1000 MG/100ML (10 MG/ML): 60 ug/kg/min | INTRAVENOUS | NDC 00069024801

## 2024-09-02 MED ADMIN — Propofol IV Emul 1000 MG/100ML (10 MG/ML): 25 ug/kg/min | INTRAVENOUS | NDC 00069024801

## 2024-09-02 MED ADMIN — Propofol IV Emul 1000 MG/100ML (10 MG/ML): 45 ug/kg/min | INTRAVENOUS | NDC 00069024801

## 2024-09-02 MED ADMIN — MEROPENEM 1 GM IVPB: 1 g | INTRAVENOUS | NDC 55150020830

## 2024-09-02 MED ADMIN — Dexmedetomidine HCl in NaCl 0.9% IV Soln 400 MCG/100ML: 1 ug/kg/h | INTRAVENOUS | NDC 00338955712

## 2024-09-02 MED ADMIN — Dexmedetomidine HCl in NaCl 0.9% IV Soln 400 MCG/100ML: 1.2 ug/kg/h | INTRAVENOUS | NDC 00338955712

## 2024-09-02 MED ADMIN — Insulin Regular (Human) in NaCl 0.9% IV Soln 100 Unit/100ML: 5 [IU]/h | INTRAVENOUS | NDC 00338012612

## 2024-09-02 MED ADMIN — Insulin Regular (Human) in NaCl 0.9% IV Soln 100 Unit/100ML: 8 [IU]/h | INTRAVENOUS | NDC 00338012612

## 2024-09-02 MED ADMIN — Linezolid IV Soln 600 MG/300ML (2 MG/ML): 600 mg | INTRAVENOUS | NDC 55150024251

## 2024-09-02 MED ADMIN — Midazolam HCl Inj 50 MG/10ML (Base Equivalent): 2 mg | INTRAVENOUS

## 2024-09-02 MED ADMIN — CEFEPIME 2 GM IVPB: 2 g | INTRAVENOUS | NDC 00409973501

## 2024-09-02 MED ADMIN — Pantoprazole Sodium For IV Soln 40 MG (Base Equiv): 40 mg | INTRAVENOUS | NDC 55150020200

## 2024-09-02 MED ADMIN — Furosemide Inj 10 MG/ML: 60 mg | INTRAVENOUS | NDC 71288020302

## 2024-09-02 MED ADMIN — Norepinephrine-Dextrose IV Solution 4 MG/250ML-5%: 12 ug/min | INTRAVENOUS | NDC 00338011220

## 2024-09-02 MED ADMIN — Fentanyl Citrate Preservative Free (PF) Inj 100 MCG/2ML: 100 ug | INTRAVENOUS

## 2024-09-02 MED ADMIN — Acetaminophen IV Soln 10 MG/ML: 1000 mg | INTRAVENOUS | NDC 63323043441

## 2024-09-02 MED ADMIN — Metronidazole IV Soln 500 MG/100ML: 500 mg | INTRAVENOUS | NDC 00338105548

## 2024-09-02 MED ADMIN — ROCURONIUM INFUSION: 7 ug/kg/min | INTRAVENOUS | NDC 70092248046

## 2024-09-02 MED ADMIN — ROCURONIUM INFUSION: 9 ug/kg/min | INTRAVENOUS | NDC 73177015903

## 2024-09-02 MED ADMIN — ROCURONIUM INFUSION: 5 ug/kg/min | INTRAVENOUS | NDC 70092248046

## 2024-09-02 MED ADMIN — Enoxaparin Sodium Inj Soln Pref Syr 40 MG/0.4ML: 40 mg | SUBCUTANEOUS | NDC 16714001601

## 2024-09-02 MED ADMIN — Midazolam 100 MG/100ML-Sodium Chloride 0.9% IV Soln: 6 mg/h | INTRAVENOUS | NDC 00143938001

## 2024-09-02 MED ADMIN — Metoprolol Tartrate IV Soln 5 MG/5ML: 5 mg | INTRAVENOUS | NDC 00409177815

## 2024-09-02 MED ADMIN — Insulin Aspart Inj Soln 100 Unit/ML: 8 [IU] | SUBCUTANEOUS | NDC 73070010011

## 2024-09-02 MED ADMIN — Fentanyl Citrate-NaCl 0.9% IV Soln 2.5 MG/250ML: 400 ug/h | INTRAVENOUS | NDC 73177010225

## 2024-09-02 MED ADMIN — Albumin, Human Inj 25%: 12.5 g | INTRAVENOUS | NDC 44206025105

## 2024-09-02 MED ADMIN — Vancomycin HCl IV Soln 1500 MG/300ML (Base Equivalent): 1500 mg | INTRAVENOUS | NDC 70594004301

## 2024-09-02 MED ADMIN — vasopressin 20 units/100 mL infusion: 0.03 [IU]/min | INTRAVENOUS | NDC 81298855203

## 2024-09-02 MED ADMIN — CALCIUM GLUCONATE IVPB: 4 g | INTRAVENOUS | NDC 70069072701

## 2024-09-03 DIAGNOSIS — J9601 Acute respiratory failure with hypoxia: Secondary | ICD-10-CM | POA: Diagnosis not present

## 2024-09-03 DIAGNOSIS — J15211 Pneumonia due to Methicillin susceptible Staphylococcus aureus: Secondary | ICD-10-CM | POA: Diagnosis not present

## 2024-09-03 DIAGNOSIS — A4101 Sepsis due to Methicillin susceptible Staphylococcus aureus: Secondary | ICD-10-CM | POA: Diagnosis not present

## 2024-09-03 DIAGNOSIS — J9602 Acute respiratory failure with hypercapnia: Secondary | ICD-10-CM | POA: Diagnosis not present

## 2024-09-03 MED ADMIN — Mupirocin Oint 2%: 1 | NASAL | NDC 45802011222

## 2024-09-03 MED ADMIN — ORAL CARE MOUTH RINSE: 15 mL | OROMUCOSAL | NDC 99999080097

## 2024-09-03 MED ADMIN — Midazolam 100 MG/100ML-Sodium Chloride 0.9% IV Soln: 10 mg/h | INTRAVENOUS | NDC 00143938001

## 2024-09-03 MED ADMIN — Heparin Sod (Porcine) in NaCl IV Soln 25000 Unit/250ML-0.45%: 1600 [IU]/h | INTRAVENOUS | NDC 63323051701

## 2024-09-03 MED ADMIN — Enoxaparin Sodium Inj Soln Pref Syr 30 MG/0.3ML: 30 mg | SUBCUTANEOUS | NDC 71288043280

## 2024-09-03 MED ADMIN — Enoxaparin Sodium Inj Soln Pref Syr 30 MG/0.3ML: 30 mg | SUBCUTANEOUS | NDC 00781323801

## 2024-09-03 MED ADMIN — Fentanyl Citrate-NaCl 0.9% IV Soln 2.5 MG/250ML: 300 ug/h | INTRAVENOUS | NDC 99999070038

## 2024-09-03 MED ADMIN — Fentanyl Citrate-NaCl 0.9% IV Soln 2.5 MG/250ML: 350 ug/h | INTRAVENOUS | NDC 99999070038

## 2024-09-03 MED ADMIN — White Petrolatum-Mineral Oil Ophth Ointment: 1 | OPHTHALMIC | NDC 00904648838

## 2024-09-03 MED ADMIN — Chlorhexidine Gluconate Pads 2%: 6 | TOPICAL | NDC 53462070523

## 2024-09-03 MED ADMIN — CISATRACURIUM (NIMBEX) INFUSION 2 MG/ML: 4 ug/kg/min | INTRAVENOUS | NDC 00781315380

## 2024-09-03 MED ADMIN — CISATRACURIUM (NIMBEX) INFUSION 2 MG/ML: 2.5 ug/kg/min | INTRAVENOUS | NDC 00781315380

## 2024-09-03 MED ADMIN — CISATRACURIUM (NIMBEX) INFUSION 2 MG/ML: 10 ug/kg/min | INTRAVENOUS | NDC 00781315380

## 2024-09-03 MED ADMIN — MEROPENEM 1 GM IVPB: 1 g | INTRAVENOUS | NDC 55150020830

## 2024-09-03 MED ADMIN — Insulin Regular (Human) in NaCl 0.9% IV Soln 100 Unit/100ML: 7.5 [IU]/h | INTRAVENOUS | NDC 00338012612

## 2024-09-03 MED ADMIN — Linezolid IV Soln 600 MG/300ML (2 MG/ML): 600 mg | INTRAVENOUS | NDC 55150024251

## 2024-09-03 MED ADMIN — Midazolam HCl Inj 50 MG/10ML (Base Equivalent): 2 mg | INTRAVENOUS

## 2024-09-03 MED ADMIN — Pantoprazole Sodium For IV Soln 40 MG (Base Equiv): 40 mg | INTRAVENOUS | NDC 55150020200

## 2024-09-03 MED ADMIN — Fentanyl Citrate Preservative Free (PF) Inj 100 MCG/2ML: 100 ug | INTRAVENOUS | NDC 99999070038

## 2024-09-03 MED ADMIN — POTASSIUM PHOSPHATE 30MMOL/500ML IV SOLN: 30 mmol | INTRAVENOUS | NDC 65219005409

## 2024-09-03 MED ADMIN — Furosemide Inj 10 MG/ML: 80 mg | INTRAVENOUS | NDC 71288020304

## 2024-09-04 DIAGNOSIS — J15211 Pneumonia due to Methicillin susceptible Staphylococcus aureus: Secondary | ICD-10-CM | POA: Diagnosis not present

## 2024-09-04 DIAGNOSIS — J15212 Pneumonia due to Methicillin resistant Staphylococcus aureus: Secondary | ICD-10-CM | POA: Diagnosis not present

## 2024-09-04 DIAGNOSIS — E870 Hyperosmolality and hypernatremia: Secondary | ICD-10-CM | POA: Diagnosis not present

## 2024-09-04 DIAGNOSIS — J9602 Acute respiratory failure with hypercapnia: Secondary | ICD-10-CM | POA: Diagnosis not present

## 2024-09-04 DIAGNOSIS — A4101 Sepsis due to Methicillin susceptible Staphylococcus aureus: Secondary | ICD-10-CM | POA: Diagnosis not present

## 2024-09-04 DIAGNOSIS — S066X0A Traumatic subarachnoid hemorrhage without loss of consciousness, initial encounter: Secondary | ICD-10-CM

## 2024-09-04 DIAGNOSIS — J9601 Acute respiratory failure with hypoxia: Secondary | ICD-10-CM | POA: Diagnosis not present

## 2024-09-04 DIAGNOSIS — S7291XA Unspecified fracture of right femur, initial encounter for closed fracture: Secondary | ICD-10-CM | POA: Diagnosis not present

## 2024-09-04 DIAGNOSIS — E669 Obesity, unspecified: Secondary | ICD-10-CM | POA: Diagnosis not present

## 2024-09-04 DIAGNOSIS — S065X0A Traumatic subdural hemorrhage without loss of consciousness, initial encounter: Secondary | ICD-10-CM

## 2024-09-04 DIAGNOSIS — A4102 Sepsis due to Methicillin resistant Staphylococcus aureus: Secondary | ICD-10-CM | POA: Diagnosis not present

## 2024-09-04 MED ADMIN — Mupirocin Oint 2%: 1 | NASAL | NDC 45802011222

## 2024-09-04 MED ADMIN — Mupirocin Oint 2%: 1 | NASAL | NDC 68462018022

## 2024-09-04 MED ADMIN — ORAL CARE MOUTH RINSE: 15 mL | OROMUCOSAL | NDC 99999080097

## 2024-09-04 MED ADMIN — Midazolam 100 MG/100ML-Sodium Chloride 0.9% IV Soln: 10 mg/h | INTRAVENOUS | NDC 00143938001

## 2024-09-04 MED ADMIN — Enoxaparin Sodium Inj Soln Pref Syr 30 MG/0.3ML: 30 mg | SUBCUTANEOUS | NDC 71288043280

## 2024-09-04 MED ADMIN — Fentanyl Citrate-NaCl 0.9% IV Soln 2.5 MG/250ML: 400 ug/h | INTRAVENOUS | NDC 99999070038

## 2024-09-04 MED ADMIN — Fentanyl Citrate-NaCl 0.9% IV Soln 2.5 MG/250ML: 300 ug/h | INTRAVENOUS | NDC 99999070038

## 2024-09-04 MED ADMIN — Fentanyl Citrate-NaCl 0.9% IV Soln 2.5 MG/250ML: 350 ug/h | INTRAVENOUS | NDC 99999070038

## 2024-09-04 MED ADMIN — White Petrolatum-Mineral Oil Ophth Ointment: 1 | OPHTHALMIC | NDC 00904648838

## 2024-09-04 MED ADMIN — Chlorhexidine Gluconate Pads 2%: 6 | TOPICAL | NDC 53462070523

## 2024-09-04 MED ADMIN — CISATRACURIUM (NIMBEX) INFUSION 2 MG/ML: 4.5 ug/kg/min | INTRAVENOUS | NDC 00781315380

## 2024-09-04 MED ADMIN — CISATRACURIUM (NIMBEX) INFUSION 2 MG/ML: 4 ug/kg/min | INTRAVENOUS | NDC 00781315380

## 2024-09-04 MED ADMIN — MEROPENEM 1 GM IVPB: 1 g | INTRAVENOUS | NDC 55150020830

## 2024-09-04 MED ADMIN — Insulin Regular (Human) in NaCl 0.9% IV Soln 100 Unit/100ML: 8.5 [IU]/h | INTRAVENOUS | NDC 00338012612

## 2024-09-04 MED ADMIN — Insulin Regular (Human) in NaCl 0.9% IV Soln 100 Unit/100ML: 4.2 [IU]/h | INTRAVENOUS | NDC 00338012612

## 2024-09-04 MED ADMIN — Linezolid IV Soln 600 MG/300ML (2 MG/ML): 600 mg | INTRAVENOUS | NDC 55150024251

## 2024-09-04 MED ADMIN — Midazolam HCl Inj 50 MG/10ML (Base Equivalent): 2 mg | INTRAVENOUS | NDC 00143938001

## 2024-09-04 MED ADMIN — Midazolam HCl Inj 50 MG/10ML (Base Equivalent): 2 mg | INTRAVENOUS

## 2024-09-04 MED ADMIN — Sodium Chloride Irrigation Soln 0.9%: 2000 mL | NDC 99999050048

## 2024-09-04 MED ADMIN — Furosemide Inj 10 MG/ML: 80 mg | INTRAVENOUS | NDC 71288020304

## 2024-09-04 MED ADMIN — Pantoprazole Sodium For IV Soln 40 MG (Base Equiv): 40 mg | INTRAVENOUS | NDC 55150020200

## 2024-09-04 MED ADMIN — Rocuronium Bromide IV Soln Pref Syr 100 MG/10ML (10 MG/ML): 30 mg | INTRAVENOUS | NDC 99999070048

## 2024-09-04 MED ADMIN — Rocuronium Bromide IV Soln Pref Syr 100 MG/10ML (10 MG/ML): 50 mg | INTRAVENOUS | NDC 99999070048

## 2024-09-04 MED ADMIN — Fentanyl Citrate Preservative Free (PF) Inj 100 MCG/2ML: 100 ug | INTRAVENOUS | NDC 99999070038

## 2024-09-04 MED ADMIN — Potassium Chloride Inj 10 mEq/50ML: 10 meq | INTRAVENOUS | NDC 00338070541

## 2024-09-04 MED ADMIN — Acetaminophen IV Soln 10 MG/ML: 1000 mg | INTRAVENOUS | NDC 63323043441

## 2024-09-04 MED ADMIN — Metoprolol Tartrate IV Soln 5 MG/5ML: 5 mg | INTRAVENOUS | NDC 00409177815

## 2024-09-04 MED ADMIN — Propofol IV Emul 500 MG/50ML (10 MG/ML): 50 ug/kg/min | INTRAVENOUS | NDC 00069023420

## 2024-09-05 DIAGNOSIS — J15212 Pneumonia due to Methicillin resistant Staphylococcus aureus: Secondary | ICD-10-CM

## 2024-09-05 DIAGNOSIS — J9600 Acute respiratory failure, unspecified whether with hypoxia or hypercapnia: Secondary | ICD-10-CM

## 2024-09-05 DIAGNOSIS — R509 Fever, unspecified: Secondary | ICD-10-CM

## 2024-09-05 DIAGNOSIS — B957 Other staphylococcus as the cause of diseases classified elsewhere: Secondary | ICD-10-CM

## 2024-09-05 DIAGNOSIS — J9602 Acute respiratory failure with hypercapnia: Secondary | ICD-10-CM | POA: Diagnosis not present

## 2024-09-05 DIAGNOSIS — J9601 Acute respiratory failure with hypoxia: Secondary | ICD-10-CM | POA: Diagnosis not present

## 2024-09-05 DIAGNOSIS — A4102 Sepsis due to Methicillin resistant Staphylococcus aureus: Secondary | ICD-10-CM | POA: Diagnosis not present

## 2024-09-05 DIAGNOSIS — J0410 Acute tracheitis without obstruction: Secondary | ICD-10-CM

## 2024-09-05 DIAGNOSIS — D72829 Elevated white blood cell count, unspecified: Secondary | ICD-10-CM

## 2024-09-05 MED ADMIN — Mupirocin Oint 2%: 1 | NASAL | NDC 45802011222

## 2024-09-05 MED ADMIN — ORAL CARE MOUTH RINSE: 15 mL | OROMUCOSAL | NDC 99999080097

## 2024-09-05 MED ADMIN — Midazolam 100 MG/100ML-Sodium Chloride 0.9% IV Soln: 10 mg/h | INTRAVENOUS | NDC 00143938001

## 2024-09-05 MED ADMIN — Quetiapine Fumarate Tab 50 MG: 50 mg | NDC 67877024901

## 2024-09-05 MED ADMIN — Enoxaparin Sodium Inj Soln Pref Syr 30 MG/0.3ML: 30 mg | SUBCUTANEOUS | NDC 71288043280

## 2024-09-05 MED ADMIN — Insulin Glargine-yfgn Inj 100 Unit/ML: 12 [IU] | SUBCUTANEOUS | NDC 83257001411

## 2024-09-05 MED ADMIN — Fentanyl Citrate-NaCl 0.9% IV Soln 2.5 MG/250ML: 275 ug/h | INTRAVENOUS | NDC 99999070038

## 2024-09-05 MED ADMIN — Fentanyl Citrate-NaCl 0.9% IV Soln 2.5 MG/250ML: 400 ug/h | INTRAVENOUS | NDC 99999070038

## 2024-09-05 MED ADMIN — Aripiprazole Tab 5 MG: 5 mg | NDC 50268008811

## 2024-09-05 MED ADMIN — White Petrolatum-Mineral Oil Ophth Ointment: 1 | OPHTHALMIC | NDC 00904648838

## 2024-09-05 MED ADMIN — CISATRACURIUM (NIMBEX) INFUSION 2 MG/ML: 4.5 ug/kg/min | INTRAVENOUS | NDC 00781315380

## 2024-09-05 MED ADMIN — MEROPENEM 1 GM IVPB: 1 g | INTRAVENOUS | NDC 55150020830

## 2024-09-05 MED ADMIN — Cholecalciferol Tab 25 MCG (1000 Unit): 1000 [IU] | NDC 2055503300

## 2024-09-05 MED ADMIN — Diazepam Tab 5 MG: 10 mg | NDC 51079028501

## 2024-09-05 MED ADMIN — Linezolid IV Soln 600 MG/300ML (2 MG/ML): 600 mg | INTRAVENOUS | NDC 55150024251

## 2024-09-05 MED ADMIN — Insulin Aspart Inj Soln 100 Unit/ML: 7 [IU] | SUBCUTANEOUS | NDC 73070010011

## 2024-09-05 MED ADMIN — Insulin Aspart Inj Soln 100 Unit/ML: 4 [IU] | SUBCUTANEOUS | NDC 73070010011

## 2024-09-05 MED ADMIN — Insulin Aspart Inj Soln 100 Unit/ML: 4 [IU] | SUBCUTANEOUS | NDC 00169750111

## 2024-09-05 MED ADMIN — Midazolam HCl Inj 50 MG/10ML (Base Equivalent): 2 mg | INTRAVENOUS

## 2024-09-05 MED ADMIN — Pantoprazole Sodium For IV Soln 40 MG (Base Equiv): 40 mg | INTRAVENOUS | NDC 55150020200

## 2024-09-05 MED ADMIN — Labetalol HCl IV Soln 5 MG/ML: 10 mg | INTRAVENOUS | NDC 70092100944

## 2024-09-05 MED ADMIN — Fentanyl Citrate Preservative Free (PF) Inj 100 MCG/2ML: 100 ug | INTRAVENOUS

## 2024-09-05 MED ADMIN — Fentanyl Citrate Preservative Free (PF) Inj 100 MCG/2ML: 100 ug | INTRAVENOUS | NDC 99999070038

## 2024-09-05 MED ADMIN — Water For Injection: 10 mL | NDC 00409488717

## 2024-09-05 MED ADMIN — Carvedilol Tab 12.5 MG: 25 mg | NDC 00904730761

## 2024-09-05 MED ADMIN — Acetaminophen Suppos 650 MG: 650 mg | RECTAL | NDC 00713016506

## 2024-09-05 MED ADMIN — Acetazolamide Sodium For Inj 500 MG: 500 mg | INTRAVENOUS | NDC 00143950301

## 2024-09-05 MED ADMIN — Furosemide Inj 10 MG/ML: 80 mg | INTRAVENOUS | NDC 71288020304

## 2024-09-05 MED ADMIN — Furosemide Inj 10 MG/ML: 80 mg | INTRAVENOUS | NDC 36000028325

## 2024-09-06 DIAGNOSIS — I469 Cardiac arrest, cause unspecified: Secondary | ICD-10-CM

## 2024-09-06 MED ADMIN — Mupirocin Oint 2%: 1 | NASAL | NDC 45802011222

## 2024-09-06 MED ADMIN — ORAL CARE MOUTH RINSE: 15 mL | OROMUCOSAL | NDC 99999080097

## 2024-09-06 MED ADMIN — Midazolam 100 MG/100ML-Sodium Chloride 0.9% IV Soln: 10 mg/h | INTRAVENOUS | NDC 00143938001

## 2024-09-06 MED ADMIN — Heparin Sod (Porcine)-NaCl IV Soln 1000 Unit/500ML-0.9%: 500 mL | NDC 00409762013

## 2024-09-06 MED ADMIN — Quetiapine Fumarate Tab 50 MG: 50 mg | NDC 67877024901

## 2024-09-06 MED ADMIN — Enoxaparin Sodium Inj Soln Pref Syr 30 MG/0.3ML: 30 mg | SUBCUTANEOUS | NDC 71288043280

## 2024-09-06 MED ADMIN — Insulin Glargine-yfgn Inj 100 Unit/ML: 12 [IU] | SUBCUTANEOUS | NDC 83257001411

## 2024-09-06 MED ADMIN — Epinephrine IV Soln Prefilled Syringe 1 MG/10ML (0.1 MG/ML): 1 mg | INTRAVENOUS | NDC 76329331801

## 2024-09-06 MED ADMIN — Aripiprazole Tab 5 MG: 5 mg | NDC 50268008811

## 2024-09-06 MED ADMIN — White Petrolatum-Mineral Oil Ophth Ointment: 1 | OPHTHALMIC | NDC 00904648838

## 2024-09-06 MED ADMIN — Chlorhexidine Gluconate Pads 2%: 6 | TOPICAL | NDC 53462070523

## 2024-09-06 MED ADMIN — CISATRACURIUM (NIMBEX) INFUSION 2 MG/ML: 5 ug/kg/min | INTRAVENOUS | NDC 00781315380

## 2024-09-06 MED ADMIN — CISATRACURIUM (NIMBEX) INFUSION 2 MG/ML: 4 ug/kg/min | INTRAVENOUS | NDC 00781315380

## 2024-09-06 MED ADMIN — MEROPENEM 1 GM IVPB: 1 g | INTRAVENOUS | NDC 55150020830

## 2024-09-06 MED ADMIN — Cholecalciferol Tab 25 MCG (1000 Unit): 1000 [IU] | NDC 2055503300

## 2024-09-06 MED ADMIN — Diazepam Tab 5 MG: 10 mg | NDC 51079028501

## 2024-09-06 MED ADMIN — Linezolid IV Soln 600 MG/300ML (2 MG/ML): 600 mg | INTRAVENOUS | NDC 55150024251

## 2024-09-06 MED ADMIN — Insulin Aspart Inj Soln 100 Unit/ML: 4 [IU] | SUBCUTANEOUS | NDC 00169750111

## 2024-09-06 MED ADMIN — Insulin Aspart Inj Soln 100 Unit/ML: 7 [IU] | SUBCUTANEOUS | NDC 00169750111

## 2024-09-06 MED ADMIN — Pantoprazole Sodium For IV Soln 40 MG (Base Equiv): 40 mg | INTRAVENOUS | NDC 55150020200

## 2024-09-06 MED ADMIN — Carvedilol Tab 12.5 MG: 25 mg | NDC 00904730761

## 2024-09-06 MED ADMIN — Metoclopramide HCl Inj 5 MG/ML (Base Equivalent): 10 mg | INTRAVENOUS | NDC 00409341421

## 2024-09-06 MED ADMIN — Furosemide Inj 10 MG/ML: 80 mg | INTRAVENOUS | NDC 36000028325

## 2024-09-06 MED ADMIN — Sodium Bicarbonate IV Soln 8.4%: 50 meq | INTRAVENOUS | NDC 76329335201

## 2024-09-06 MED ADMIN — Atropine Sulfate Soln Prefill Syr 1 MG/10ML (0.1 MG/ML): 1 mg | INTRAVENOUS | NDC 76329334001

## 2024-09-06 MED ADMIN — Fentanyl Citrate Preservative Free (PF) Inj 100 MCG/2ML: 400 ug/h | INTRAVENOUS | NDC 72572017201

## 2024-09-06 MED ADMIN — Sodium Phosphates - Enema: 1 | RECTAL | NDC 70677108901

## 2024-09-06 MED ADMIN — Water For Injection: 10 mL | NDC 00409488717

## 2024-09-06 MED ADMIN — Insulin Glargine-yfgn Inj 100 Unit/ML: 6 [IU] | SUBCUTANEOUS | NDC 83257001411

## 2024-09-06 MED ADMIN — HYDROMORPHONE 2 MG/ML INFUSION 100 ML: 5 mg/h | INTRAVENOUS | NDC 00703011301

## 2024-09-06 MED ADMIN — Acetazolamide Sodium For Inj 500 MG: 500 mg | INTRAVENOUS | NDC 00143950301

## 2024-09-07 ENCOUNTER — Encounter (HOSPITAL_COMMUNITY): Payer: Self-pay | Admitting: Internal Medicine

## 2024-09-07 ENCOUNTER — Encounter (HOSPITAL_COMMUNITY): Payer: Self-pay | Admitting: Emergency Medicine
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
## 2024-09-30 DEATH — deceased
